# Patient Record
Sex: Female | Born: 1955 | Race: Black or African American | Hispanic: No | Smoking: Never smoker
Health system: Southern US, Community
[De-identification: ages and names within clinical notes are randomized; demographics above are authoritative.]

## PROBLEM LIST (undated history)

## (undated) DIAGNOSIS — E785 Hyperlipidemia, unspecified: Secondary | ICD-10-CM

## (undated) DIAGNOSIS — G459 Transient cerebral ischemic attack, unspecified: Secondary | ICD-10-CM

## (undated) DIAGNOSIS — J45909 Unspecified asthma, uncomplicated: Secondary | ICD-10-CM

## (undated) DIAGNOSIS — I4891 Unspecified atrial fibrillation: Secondary | ICD-10-CM

## (undated) DIAGNOSIS — G2582 Stiff-man syndrome: Secondary | ICD-10-CM

## (undated) DIAGNOSIS — J449 Chronic obstructive pulmonary disease, unspecified: Secondary | ICD-10-CM

## (undated) DIAGNOSIS — E119 Type 2 diabetes mellitus without complications: Secondary | ICD-10-CM

## (undated) DIAGNOSIS — I10 Essential (primary) hypertension: Secondary | ICD-10-CM

## (undated) DIAGNOSIS — R569 Unspecified convulsions: Secondary | ICD-10-CM

## (undated) DIAGNOSIS — D649 Anemia, unspecified: Secondary | ICD-10-CM

## (undated) DIAGNOSIS — F32A Depression, unspecified: Secondary | ICD-10-CM

## (undated) HISTORY — DX: Stiff-man syndrome: G25.82

## (undated) HISTORY — PX: OTHER SURGICAL HISTORY: SHX169

## (undated) HISTORY — PX: HYSTERECTOMY: SHX81

## (undated) NOTE — Telephone Encounter (Signed)
Formatting of this note is different from the original.  Patient requesting Medication refill.      Medication Name Tramadol     Last Ordered 04/29/2020     Medication Refill Policy Required Information    Last visit with provider was 04/29/2020    Next appt in this department: Visit date not found    VERIFIED:      Allergies   Allergen Reactions   ? Montelukast rash/itching and swelling   ? Nitroglycerin hives and rash/itching   ? Magnesium Sulfate rash/itching   ? Aspirin gi distress       ? Bumex [Bumetanide] muscle/joint pain   ? Hytrin [Terazosin] hives   ? Iodinated Contrast Media swelling     Throat swelling and lip itching   ? Penicillins hives     Has tolerated ceftriaxone and cephalexin   ? Tetracyclines hives   ? Latex, Natural Rubber rash/itching     Current Outpatient Medications on File Prior to Visit   Medication Sig Dispense Refill   ? albuterol sulfate (PROAIR DIGIHALER) 90 mcg/actuation INH aebs Take 180 mcg inhaled by mouth Every 4 Hours As Needed.     ? amLODIPine (NORVASC) 2.5 mg PO TABS Take 2.5 mg by Mouth Once a Day.     ? atorvastatin (LIPITOR) 40 mg PO TABS TAKE 1 TABLET BY MOUTH EVERYDAY AT BEDTIME 90 Tab 1   ? budesonide-formoteroL (SYMBICORT) 80-4.5 mcg/actuation INH HFAA Take 2 Puffs inhaled by mouth Twice Daily. 1 Inhaler 2   ? dicyclomine (BENTYL) 20 mg PO TABS Take 20 mg by Mouth Twice Daily.     ? ELIQUIS 5 mg PO TABS Take 5 mg by Mouth Twice Daily.     ? EPINEPHrine (EPIPEN) 0.3 mg/0.3 mL Inj AtIn Inject 0.3 mL into the muscle Take As Needed for Other (ANAPHYLAXIS). Q 10-30 minutes prn for ANAPHYLAXIS ONLY 2 Each 1   ? furosemide (LASIX) 40 mg PO TABS Take 40 mg by Mouth Twice Daily.     ? hydrOXYzine pamoate (VISTARIL) 25 mg PO CAPS Take 1 Cap by Mouth Twice Daily. 8am, 8pm     ? lamoTRIgine (LAMICTAL) 100 mg PO TABS Take 100 mg by Mouth Every Morning.     ? LORazepam (ATIVAN) 0.5 mg PO TABS Take 0.5 mg by Mouth Daily as needed.     ? lurasidone (LATUDA) 120 mg PO TABS Take 120 mg by  Mouth every evening.     ? methylPREDNISolone (MEDROL, PAK,) 4 mg PO DsPk Take as directed on the package - dispense one pack 21 Tab 0   ? metoprolol XL (TOPROL XL) 25 mg PO TB24 TAKE 1 TABLET BY MOUTH EVERY DAY 90 Tab 1   ? pantoprazole (PROTONIX) 40 mg PO TBEC TAKE 1 TABLET BY MOUTH TWICE A DAY 180 Tab 1   ? potassium chloride ER (K-DUR;KLOR-CON) 10 mEq PO TbTQ tablet Take 10 mEq by Mouth 3 Times Daily.     ? promethazine (PHENERGAN) 25 mg PO TABS Take 1 Tab by Mouth Every 6 Hours As Needed for Nausea. 20 Tab 0   ? QUEtiapine 50 mg PO TABS Take 50 mg by Mouth Every Night at Bedtime.  2   ? rOPINIRole (REQUIP) 0.5 mg PO TABS TAKE 2 TABLETS BY MOUTH DAILY AT BEDTIME 180 Tab 1   ? SUMAtriptan (IMITREX) 100 mg PO TABS Take 1 Tab by Mouth Take As Needed. Take 1 tab at onset of headache.  May repeat in 2 hours  if no relief.  No more than 2 doses in a 24 hour period. 9 Tab 2   ? terazosin (HYTRIN) 2 mg PO CAPS TAKE 1 CAPSULE BY MOUTH EVERYDAY AT BEDTIME 90 Cap 1   ? topiramate (TOPAMAX) 25 mg PO TABS Take 1 Tab by Mouth Twice Daily. 60 Tab 0   ? traMADoL (ULTRAM) 50 mg PO TABS Take 1 Tab by Mouth 2 Times Daily As Needed. for pain 60 Tab 0     Current Facility-Administered Medications on File Prior to Visit   Medication Dose Route Frequency Provider Last Rate Last Admin   ? [COMPLETED] diphenhydrAMINE (BenadryL) capsule 25 mg  25 mg Oral Once [place], Liane Comber, DO   25 mg at 05/16/20 1831   ? [COMPLETED] diphenhydrAMINE (BenadryL) injection 25 mg  25 mg IV Push Once [place], Liane Comber, DO   25 mg at 05/16/20 2000   ? [COMPLETED] ketorolac ([place]L) injection 15 mg  15 mg Intravenous Once [place], Liane Comber, DO   15 mg at 05/16/20 2000   ? [COMPLETED] prochlorperazine Edisylate (Compazine) injection 10 mg  10 mg IV Push Once [place], Liane Comber, DO   10 mg at 05/16/20 1831   ? [COMPLETED] sodium chloride (normal saline) 0.9% infusion  1,000 mL Intravenous IV [place] Scarlette Ar, DO   Stopped at 05/16/20 2001     Electronically signed by  Dorthula Rue at 05/27/2020  7:09 AM EDT

## (undated) NOTE — Telephone Encounter (Signed)
Formatting of this note is different from the original.    Patient requesting Medication refill.      Medication Name pantoprazole 40 mg    Last Ordered 10/24/21     Medication Refill Policy Required Information    Last visit with provider was 12/24/2021    Next appt in this department: Visit date not found    VERIFIED:  Erline Levine    Allergies   Allergen Reactions    Montelukast rash/itching and swelling    Nitroglycerin hives and rash/itching    Magnesium Sulfate rash/itching    Atorvastatin unknown     Rhabdomyolysis     Bumex [Bumetanide] muscle/joint pain    Tetracaine Hcl unknown    Iodinated Contrast Media swelling     Throat swelling and lip itching    Penicillins hives     Has tolerated ceftriaxone and cephalexin    Tetracyclines hives    Latex, Natural Rubber rash/itching and hives     Current Outpatient Medications on File Prior to Visit   Medication Sig Dispense Refill    apixaban (ELIQUIS) 5 mg PO TABS Take 5 mg by Mouth Twice Daily.      EPINEPHrine (EPIPEN) 0.3 mg/0.3 mL Inj AtIn Inject 0.3 mL into the muscle Take As Needed for Other (ANAPHYLAXIS). Q 10-30 minutes prn for ANAPHYLAXIS ONLY 2 Each 1    folic acid (FOLVITE) [id number] mcg PO TABS Take 1 Tab by Mouth Once a Day. 30 Tab 11    hydrOXYzine pamoate (VISTARIL) 25 mg PO CAPS Take 1-2 Caps by Mouth Every 6 Hours As Needed.      lamoTRIgine (LAMICTAL) 100 mg PO TABS Take 100 mg by Mouth Once a Day. Indications: epilepsy      LORazepam (ATIVAN) 0.5 mg PO TABS Take 0.5 mg by Mouth Every Night at Bedtime.      magnesium oxide (MAG-OX) 400 mg (241.3 mg magnesium) PO TABS TAKE 1 TABLET EVERY DAY FOR 15 DAYS      MetoCLOPramide (REGLAN) 10 mg PO ODT. Take 1 Tab by Mouth 3 (three) times daily before meals. 60 Tab 0    ondansetron (ZOFRAN) 4 mg PO TABS Take 1 Tab by Mouth Every 8 Hours As Needed. 30 Tab 0    pantoprazole (PROTONIX) 40 mg PO TBEC Take 40 mg by Mouth Twice Daily.      QUEtiapine (SEROQUEL) 100 mg PO TABS Take 100 mg by Mouth Every Night at Bedtime.       Rizatriptan 10 mg PO TABS TAKE 1 TAB BY MOUTH ONCE AS NEEDED FOR UP TO 1 DOSE. TAKE ONCE AT ONSET OF MIGRAINE. MAY REPEAT IN 2 HOURS IF NO IMPROVEMENT. NO MORE THAN 2 PILLS IN A 24 HOUR PERIOD. 9 Tab 3    rOPINIRole (REQUIP) 0.5 mg PO TABS TAKE 2 TABLETS BY MOUTH AT BEDTIME 180 Tab 1    SYMBICORT 80-4.5 mcg/actuation INH HFAA Take 2 Puffs inhaled by mouth Twice Daily.      terazosin (HYTRIN) 2 mg PO CAPS Take 2 mg by Mouth Every Night at Bedtime. Indications: high blood pressure      traMADoL (ULTRAM) 50 mg PO TABS Take 1 Tab by Mouth 2 Times Daily As Needed. for pain 60 Tab 0     No current facility-administered medications on file prior to visit.       Electronically signed by Mackie Pai Y at 01/25/2022 10:48 AM EDT

## (undated) NOTE — Telephone Encounter (Signed)
Formatting of this note is different from the original.  Patient requesting Medication refill.      Medication Name terazosin    Last Ordered 10-04-2021     Medication Refill Policy Required Information    Last visit with provider was 12/24/2021    Next appt in this department: Visit date not found    VERIFIED:      Allergies   Allergen Reactions    Montelukast rash/itching and swelling    Nitroglycerin hives and rash/itching    Magnesium Sulfate rash/itching    Atorvastatin unknown     Rhabdomyolysis     Bumex [Bumetanide] muscle/joint pain    Tetracaine Hcl unknown    Iodinated Contrast Media swelling     Throat swelling and lip itching    Penicillins hives     Has tolerated ceftriaxone and cephalexin    Tetracyclines hives    Latex, Natural Rubber rash/itching and hives     Current Outpatient Medications on File Prior to Visit   Medication Sig Dispense Refill    [EXPIRED] acetaminophen (TYLENOL) 500 mg PO TABS Take 2 Tabs by Mouth 3 Times Daily for 10 days. 60 Tab 0    apixaban (ELIQUIS) 5 mg PO TABS Take 5 mg by Mouth Twice Daily.      aspirin 81 mg PO CHEW Take 1 Tab by Mouth Once a Day for 30 days. 30 Tab 0    cephALEXin (KEFLEX) 500 mg PO CAPS Take 1 Cap by Mouth Every 6 Hours for 7 days. 28 Cap 0    EPINEPHrine (EPIPEN) 0.3 mg/0.3 mL Inj AtIn Inject 0.3 mL into the muscle Take As Needed for Other (ANAPHYLAXIS). Q 10-30 minutes prn for ANAPHYLAXIS ONLY 2 Each 1    [EXPIRED] erythromycin base (E-MYCIN) 500 mg PO TABS Take 1 Tab by Mouth 4 Times Daily for 15 days. 60 Tab 0    folic acid (FOLVITE) A999333 mcg PO TABS Take 1 Tab by Mouth Once a Day. 30 Tab 11    hydrOXYzine pamoate (VISTARIL) 25 mg PO CAPS Take 1-2 Caps by Mouth Every 6 Hours As Needed.      lamoTRIgine (LAMICTAL) 100 mg PO TABS Take 100 mg by Mouth Once a Day. Indications: epilepsy      [DISCONTINUED] LATUDA 120 mg PO TABS Take 120 mg by Mouth every evening. Indications: bipolar depression      [DISCONTINUED] levoFLOXacin (LEVAQUIN) 750 mg PO TABS  Take 1 Tab by Mouth Once a Day for 2 days. 2 Tab 0    LORazepam (ATIVAN) 0.5 mg PO TABS Take 0.5 mg by Mouth Every Night at Bedtime.      magnesium oxide (MAG-OX) 400 mg (241.3 mg magnesium) PO TABS TAKE 1 TABLET EVERY DAY FOR 15 DAYS      MetoCLOPramide (REGLAN) 10 mg PO ODT. Take 1 Tab by Mouth 3 (three) times daily before meals. 60 Tab 0    [DISCONTINUED] MetoCLOPramide (REGLAN) 10 mg PO ODT. Take 1 Tab by Mouth 4 Times Daily for 14 days. 56 Tab 0    [EXPIRED] metoprolol XL (TOPROL XL) 25 mg PO TB24 Take 1 Tab by Mouth Once a Day for 30 days. Indications: high blood pressure 30 Tab 0    ondansetron (ZOFRAN) 4 mg PO TABS Take 1 Tab by Mouth Every 8 Hours As Needed. 30 Tab 0    [DISCONTINUED] ondansetron (ZOFRAN) 4 mg PO TABS Take 4 mg by Mouth.      pantoprazole (PROTONIX) 40 mg PO TBEC  Take 40 mg by Mouth Twice Daily.      [EXPIRED] potassium chloride ER (K-DUR;KLOR-CON) 20 mEq PO tablet Take 1 Tab by Mouth Twice Daily for 7 days. 14 Tab 0    [DISCONTINUED] potassium chloride ER (K-DUR;KLOR-CON) 10 mEq PO TbTQ tablet Take 1 Tab by Mouth Once a Day for 7 days. 7 Tab 0    QUEtiapine (SEROQUEL) 100 mg PO TABS Take 100 mg by Mouth Every Night at Bedtime.      Rizatriptan 10 mg PO TABS TAKE 1 TAB BY MOUTH ONCE AS NEEDED FOR UP TO 1 DOSE. TAKE ONCE AT ONSET OF MIGRAINE. MAY REPEAT IN 2 HOURS IF NO IMPROVEMENT. NO MORE THAN 2 PILLS IN A 24 HOUR PERIOD. 9 Tab 3    rOPINIRole (REQUIP) 0.5 mg PO TABS TAKE 2 TABLETS BY MOUTH AT BEDTIME 180 Tab 1    SYMBICORT 80-4.5 mcg/actuation INH HFAA Take 2 Puffs inhaled by mouth Twice Daily.      terazosin (HYTRIN) 2 mg PO CAPS Take 2 mg by Mouth Every Night at Bedtime. Indications: high blood pressure      traMADoL (ULTRAM) 50 mg PO TABS Take 1 Tab by Mouth 2 Times Daily As Needed. for pain 60 Tab 0    [DISCONTINUED] traMADoL (ULTRAM) 50 mg PO TABS TAKE 1 TAB BY MOUTH 2 TIMES DAILY AS NEEDED. FOR PAIN 60 Tab 0     Current Facility-Administered Medications on File Prior to Visit    Medication Dose Route Frequency Provider Last Rate Last Admin    [COMPLETED] acetaminophen (TylenoL) tablet 1,000 mg  1,000 mg Oral Once Lionel December, MD   1,000 mg at 12/23/21 1415    [DISCONTINUED] acetaminophen (TylenoL) tablet 1,000 mg  1,000 mg Oral TID Warner Mccreedy R, NP   1,000 mg at 12/13/21 [id number]    [DISCONTINUED] acetaminophen (TylenoL) tablet 650 mg  650 mg Oral Once Alver Fisher, MD        [DISCONTINUED] acetaminophen (TylenoL) tablet 650 mg  650 mg Oral Q4H PRN Sharene Butters, MD        [DISCONTINUED] apixaban (Eliquis) tablet 5 mg  5 mg Oral BID Sharene Butters, MD   5 mg at 12/13/21 [id number]    [DISCONTINUED] aspirin chew tab 81 mg  81 mg Oral Daily Sharene Butters, MD   81 mg at 12/13/21 [id number]    [DISCONTINUED] atropine injection 1 mg  1 mg IV Push PRN Sharene Butters, MD        [DISCONTINUED] cefTRIAXone (Rocephin) 1 g in SWFI 10 mL syringe  1 g Intravenous Once Lionel December, MD        [COMPLETED] cephALEXin (Keflex) capsule 500 mg  500 mg Oral Once Lionel December, MD   500 mg at 12/23/21 1711    [DISCONTINUED] erythromycin tablet 500 mg  500 mg Oral 4X/Day Sharene Butters, MD   500 mg at 12/13/21 0846    [DISCONTINUED] fluticasone furoate-vilanteroL (Breo) 100-25 mcg/dose inhaler 1 Puff  1 Puff Inhalation Daily Resp Sharene Butters, MD        [DISCONTINUED] folic acid (Folvite) tablet 400 mcg  400 mcg Oral Daily Sharene Butters, MD   400 mcg at 12/13/21 0847    [DISCONTINUED] insulin LISPRO (HumaLOG vial) injection 1-6 Units  1-6 Units Subcutaneous QAC & QHS Sharene Butters, MD        [EXPIRED] Lactated Ringers (LR) infusion  100 mL/hr Intravenous Continuous Sharene Butters, MD 100 mL/hr at 12/11/21 2037 100 mL/hr at 12/11/21  2037    [DISCONTINUED] lamoTRIgine (LaMICtal) tablet 100 mg  100 mg Oral Daily Sharene Butters, MD   100 mg at 12/13/21 0847    [COMPLETED] LORazepam (Ativan) injection 1 mg  1 mg IV Push On Call Clyde Lundborg, NP   1 mg at 12/13/21 [id number]     [DISCONTINUED] LORazepam (Ativan) tablet 0.5 mg  0.5 mg Oral QHS Sharene Butters, MD   0.5 mg at 12/12/21 2017    [DISCONTINUED] lurasidone (Latuda) tablet 120 mg  120 mg Oral QPM Sharene Butters, MD        [DISCONTINUED] metoCLOPramide (Reglan) tablet 5 mg  5 mg Oral Q6H PRN Resp Sharene Butters, MD   5 mg at 12/12/21 [id number]    [DISCONTINUED] metoprolol XL (Toprol XL) tablet 25 mg  25 mg Oral Daily Sharene Butters, MD   25 mg at 12/13/21 [id number]    [DISCONTINUED] morphine injection 1 mg  1 mg Intravenous Q4H PRN Mutongwizo, Farai, DO   1 mg at 12/12/21 0500    [COMPLETED] morphine injection 2 mg  2 mg IV Push Once Percell Miller S, NP   2 mg at 12/13/21 [id number]    [DISCONTINUED] NSFlush injection 10 mL  10 mL Intravenous PRN Sharene Butters, MD        [DISCONTINUED] omeprazole (PriLOSEC) capsule 40 mg  40 mg Oral Daily Sharene Butters, MD   40 mg at 12/13/21 0846    [COMPLETED] ondansetron (PF) (Zofran) injection 4 mg  4 mg IV Push Once Lionel December, MD   4 mg at 12/23/21 1324    [DISCONTINUED] potassium chloride ER (K-Dur;Klor-Con) tablet 20 mEq  20 mEq Oral BID WC Warner Mccreedy R, NP   20 mEq at 12/13/21 [id number]    [DISCONTINUED] potassium chloride ER (K-Dur;Klor-Con) tablet 10 mEq  10 mEq Oral Daily Sharene Butters, MD   10 mEq at 12/12/21 [id number]    [DISCONTINUED] QUEtiapine (SEROquel) tablet 100 mg  100 mg Oral QHS Sharene Butters, MD   100 mg at 12/12/21 2017    [DISCONTINUED] rOPINIRole (Requip) tablet 1 mg  1 mg Oral QHS Sharene Butters, MD   1 mg at 12/12/21 2018    [COMPLETED] sodium chloride (normal saline) 0.9% infusion  1,000 mL Intravenous IV Bolus Lionel December, MD   End Infusion at 12/23/21 1717    [DISCONTINUED] terazosin (Hytrin) capsule 2 mg  2 mg Oral QHS Sharene Butters, MD   2 mg at 12/12/21 2020    [DISCONTINUED] traMADoL (Ultram) tablet 50 mg  50 mg Oral BID PRN Sharene Butters, MD   50 mg at 12/12/21 2126     Electronically signed by Maryelizabeth Rowan at 12/27/2021  8:07 AM EST

## (undated) NOTE — Progress Notes (Signed)
Formatting of this note is different from the original.  [name]   ED/Hospital Discharge   Transition of Care Note / Initial Assessment    Basic Chronic Care Management Coding Initiation    [name]  03/10/1956    The patient has agreed and consented verbally to Chronic Care Management (CCM) services for management of two or more chronic conditions: List all applicable chronic conditions: Neurogenic bladder, A-fib    Patient is aware that services can be discontinued at any time, and that there is a monthly copayment required for the service.    Chronic Care Management Patient Tracker    Date/Time of Contact for Enrollment 05/03/2021  3:27 PM     Method of Contact Phone     Patient Response interested     Enrollment Status currently enrolled     Enrolling Provider Donnal Moat, MD     Reason for Visit:  Chronic Care Management    Type of Nursing Service Rendered:  Telephonic Education    Time Spent on Activity:  0-15 minutes    [name] is a 75 y.o. female patient.    PCP: Donnal Moat, MD    ED/Hospital Transition of Care Call:    HIPPA information verified:  DOB and address  Previous hospital admission in the last 30 days?: yes  Discharge from:  Hospital  Discharge Dx:  Syncope likely vasovagal in nature, probable orthostasis clinical dehydration  Admit date:  01/12/23  D/C date:  01/14/23  Assessment completed with:  Patient  Patient currently relies on the following individual(s) to assist with care:  Self  COPD: yes  Medications reviewed: Yes  Discharge instructions reviewed: yes  TOC follow-up appointment scheduled (MD name/date):  To be scheduled (declined scheduling)  Patient has transportation to the appointment: yes    Assessment:    Constitutional:  Positive for fatigue.   Skin: Negative.    HENT: Negative.     Eyes: Negative.    Cardiovascular: Negative.    Respiratory: Negative.     Gastrointestinal: Negative.    Endocrine: Negative.   Genitourinary: Negative.     Musculoskeletal: Negative.    Hematologic: Negative.    Allergy/Immuno: Negative.  Neurological: Negative.   Psychiatric/Behavioral:  Positive for depressed mood.      Outreach call to the patient who reports doing okay. The patient reports she has increased her fluid intake and also has vitamin water for hydration. When asked to schedule the patient a hospital follow-up appointment with Dr. Jimmye Norman, the patient stated that she does not need an appointment at this time. When asked about her suprapubic catheter, the patient stated that she did not want to talk about the catheter during this call. The patient agreed to a call in 1 week when she is doing better.    ADL's/Activity/Support/Social  Lives with daughter, son-in-law, and grandchildren  Independent with ADLs/IADLs    Education/Interventions provided:  * Other (see comments) .  * Fluid hydration    Follow-up and Plan:  Call and monitor weekly and PRN for 30 days.         Active Goals         Patient Goals    1. Eat Healthy      Follow Up Date 01/23/2023     - set a realistic goal     Why is this important?    When you are ready to manage your nutrition or weight, having a plan and setting  goals will help.   Taking small steps to change how you eat and exercise is a good place to start.     Notes:      2. Monitor and Manage My Blood Sugar      Follow Up Date 01/23/2023    - check blood sugar (glucose) at prescribed times  - check blood sugar before and after exercise  - check blood sugar if I feel it is too high or too low  - enter blood sugar (glucose) readings and medication or insulin into daily log  - take the blood sugar (glucose) log to all doctor visits     Why is this important?    Checking your blood sugar at home helps to keep it from getting very high or very low.   Writing the results in a diary or log helps the doctor know how to care for you.   Your blood sugar log should have the time, date and the results.   Also, write down the amount of insulin  or other medicine that you take.   Other information, like what you ate, exercise done and how you were feeling, will also be helpful.      Notes:      3. Track and Manage My Blood Pressure      Follow Up Date 01/23/2023    - check blood pressure daily  - choose a place to take my blood pressure (home, clinic or office, retail store)  - write blood pressure results in a log or diary     Why is this important?    You won't feel high blood pressure, but it can still hurt your blood vessels.   High blood pressure can cause heart or kidney problems. It can also cause a stroke.   Making lifestyle changes like losing a little weight or eating less salt will help.   Checking your blood pressure at home and at different times of the day can help to control blood pressure.   If the doctor prescribes medicine remember to take it the way the doctor ordered.   Call the office if you cannot afford the medicine or if there are questions about it.      Notes:           Home Medication List - Marked as Reviewed on 01/12/23 1620   Medication Sig   apixaban (ELIQUIS) 5 mg PO TABS Take 1 Tab by Mouth Twice Daily.   baclofen (LIORESAL) 10 mg PO TABS Take 1 Tab by Mouth 2 Times Daily As Needed for Mild Pain (Pain Score 1-3).   diazePAM (VALIUM) 5 mg PO TABS TAKE 1 TABLET IN THE EVENING AS NEEDED FOR SPASMS   diclofenac sodium 1 % Top GEL USE 4 GRAM TO AFFECTED AREA 4 TIMES DAILY.   EPINEPHrine (EPIPEN) 0.3 mg/0.3 mL Inj AtIn Inject 0.3 mL into the muscle Take As Needed for Other (ANAPHYLAXIS). Q 10-30 minutes prn for ANAPHYLAXIS ONLY   gabapentin (NEURONTIN) 100 mg PO CAPS Take 1 Cap by Mouth 3 Times Daily.   hydrOXYzine pamoate (VISTARIL) 50 mg PO CAPS Take 1 Cap by Mouth 2 Times Daily As Needed.   hyoscyamine (LEVSIN SL) 0.125 mg SL SUBL Dissolve 1 Tab under tongue Every 4 Hours As Needed (bladder spasms).   lamoTRIgine (LAMICTAL) 100 mg PO TABS Take 1 Tab by Mouth Once a Day. Indications: epilepsy   lurasidone 120 mg PO TABS Take 1 Tab  by Mouth Once a  Day.   metoCLOPramide (REGLAN) 10 mg PO TABS Take 1 Tab by Mouth Take As Needed.   metoprolol XL (TOPROL XL) 25 mg PO TB24 Take 1 Tab by Mouth Once a Day.   pantoprazole (PROTONIX) 40 mg PO TBEC take 1 tablet by mouth twice a day   potassium chloride (KLOR-CON) 10 mEq PO TbSR Take 1 Tab by Mouth 3 Times Daily.   promethazine (PHENERGAN) 25 mg PO TABS Take 1 Tab by Mouth Every 6 Hours As Needed.   QUEtiapine (SEROQUEL) 100 mg PO TABS Take 1 Tab by Mouth Every Night at Bedtime.   rOPINIRole (REQUIP) 0.5 mg PO TABS Take 2 Tabs by Mouth Every Night at Bedtime.   thiamine 100 mg PO TABS Take 1 Tab by Mouth Once a Day.   torsemide (DEMADEX) 20 mg PO TABS Take 1 Tab by Mouth Twice Daily.   traMADoL (ULTRAM) 50 mg PO TABS Take 1 Tab by Mouth 2 Times Daily As Needed. 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[facility])     CVA post endodartorectomy    Chest pain     has history multiple admissions for this, stress test and cardiac cath negative    Chronic anemia     Hgb ~10    COPD (chronic obstructive pulmonary disease) (HCC)     Cough     COVID     COVID-19 vaccine series completed     CVA (cerebral vascular accident) ([place]) 09/01/2021    Deficiency anemia 11/2020    rx with [facility]    Epilepsy ([place])     per pt last Sz was approx 9-10 months ago    Esophageal reflux     Headache     Hemiplegia and hemiparesis following cerebral infarction affecting left non-dominant side ([place]) 01/22/2020    Hemorrhoids     History of blood transfusion     following one of her pregnancy    History of cardiac catheterization no obstructive cad in 2017     Hyperlipidemia     Hyperlipidemia     Hypertension     Lower leg edema     Migraines     Paroxysmal atrial fibrillation (HCC)     on  anticoagulation    Seizure-like activity (HCC)     EEG negative    Stiff person syndrome     Syncope     recurrent, has loop recorder    Thoracic aortic aneurysm (HCC)     TIA (transient ischemic attack)     multiple since age 40's per patient. Multiple MRI brain in the past negative for evidence of stroke. Suspected complicated migraine.    Type 2 diabetes mellitus ([place])     not on medication    Unable to coordinate sucking, swallowing, and breathing 09/28/2019     Patient Care Team:  Donnal Moat, MD as PCP - General (Family Practice)  Dery, Althia Forts, Pharmacist (Ambulatory Pharmacist)  Kathlene Cote, Pharmacist (Ambulatory Pharmacist)  Carolann Littler, MD as Consulting Provider (Neurology)  Jackqulyn Livings, MD as Cardiologist (Cardiology)  Jenita Seashore, MD as Hematologist (Hematology and Oncology)  [place],  Museum/gallery conservator, RN as RN  Dwyane Dee, Richardo Hanks, MD as Consulting Provider (Hospitalist)  Marciano Sequin, MD as Gastroenterologist (Gastroenterology)  Dr. Ledon Snare as Psychiatrist (Psychiatry)  Gasper Lloyd, NP as Urologist (Urology)  Renita Papa, RN as Integrated Care Manager ([place])    Chronic Care Mgmt Time Spent with Patient     Time spent with patient (minutes): [place], BSN, Therapist, sports, [place] ([address]) and [place]  [phone]      Electronically signed by Renita Papa, RN at 01/16/2023  1:03 PM EDT

---

## 2001-04-28 ENCOUNTER — Emergency Department (HOSPITAL_COMMUNITY): Admission: EM | Admit: 2001-04-28 | Discharge: 2001-04-28 | Payer: Self-pay | Admitting: Emergency Medicine

## 2001-04-28 ENCOUNTER — Encounter: Payer: Self-pay | Admitting: Emergency Medicine

## 2001-07-03 ENCOUNTER — Encounter: Payer: Self-pay | Admitting: Orthopaedic Surgery

## 2001-07-03 ENCOUNTER — Ambulatory Visit (HOSPITAL_COMMUNITY): Admission: RE | Admit: 2001-07-03 | Discharge: 2001-07-03 | Payer: Self-pay | Admitting: Orthopaedic Surgery

## 2001-07-17 ENCOUNTER — Emergency Department (HOSPITAL_COMMUNITY): Admission: EM | Admit: 2001-07-17 | Discharge: 2001-07-18 | Payer: Self-pay | Admitting: Emergency Medicine

## 2001-07-18 ENCOUNTER — Encounter: Payer: Self-pay | Admitting: Emergency Medicine

## 2001-08-23 ENCOUNTER — Emergency Department (HOSPITAL_COMMUNITY): Admission: EM | Admit: 2001-08-23 | Discharge: 2001-08-23 | Payer: Self-pay | Admitting: Emergency Medicine

## 2001-08-23 ENCOUNTER — Encounter: Payer: Self-pay | Admitting: Emergency Medicine

## 2001-09-17 ENCOUNTER — Encounter: Payer: Self-pay | Admitting: Emergency Medicine

## 2001-09-18 ENCOUNTER — Observation Stay (HOSPITAL_COMMUNITY): Admission: EM | Admit: 2001-09-18 | Discharge: 2001-09-18 | Payer: Self-pay | Admitting: Emergency Medicine

## 2001-10-15 ENCOUNTER — Inpatient Hospital Stay (HOSPITAL_COMMUNITY): Admission: EM | Admit: 2001-10-15 | Discharge: 2001-10-16 | Payer: Self-pay | Admitting: Emergency Medicine

## 2001-10-15 ENCOUNTER — Encounter: Payer: Self-pay | Admitting: Emergency Medicine

## 2001-11-14 ENCOUNTER — Ambulatory Visit (HOSPITAL_COMMUNITY): Admission: RE | Admit: 2001-11-14 | Discharge: 2001-11-14 | Payer: Self-pay | Admitting: General Surgery

## 2001-11-14 ENCOUNTER — Encounter: Payer: Self-pay | Admitting: General Surgery

## 2001-12-07 ENCOUNTER — Emergency Department (HOSPITAL_COMMUNITY): Admission: EM | Admit: 2001-12-07 | Discharge: 2001-12-07 | Payer: Self-pay | Admitting: Emergency Medicine

## 2001-12-26 ENCOUNTER — Encounter: Payer: Self-pay | Admitting: Emergency Medicine

## 2001-12-26 ENCOUNTER — Emergency Department (HOSPITAL_COMMUNITY): Admission: EM | Admit: 2001-12-26 | Discharge: 2001-12-26 | Payer: Self-pay | Admitting: Emergency Medicine

## 2001-12-30 ENCOUNTER — Emergency Department (HOSPITAL_COMMUNITY): Admission: EM | Admit: 2001-12-30 | Discharge: 2001-12-30 | Payer: Self-pay | Admitting: Emergency Medicine

## 2001-12-30 ENCOUNTER — Encounter: Payer: Self-pay | Admitting: Emergency Medicine

## 2001-12-31 ENCOUNTER — Encounter: Payer: Self-pay | Admitting: *Deleted

## 2001-12-31 ENCOUNTER — Emergency Department (HOSPITAL_COMMUNITY): Admission: EM | Admit: 2001-12-31 | Discharge: 2001-12-31 | Payer: Self-pay | Admitting: Emergency Medicine

## 2002-01-01 ENCOUNTER — Emergency Department (HOSPITAL_COMMUNITY): Admission: EM | Admit: 2002-01-01 | Discharge: 2002-01-01 | Payer: Self-pay | Admitting: Emergency Medicine

## 2002-01-05 ENCOUNTER — Emergency Department (HOSPITAL_COMMUNITY): Admission: EM | Admit: 2002-01-05 | Discharge: 2002-01-05 | Payer: Self-pay

## 2002-01-07 ENCOUNTER — Encounter: Payer: Self-pay | Admitting: *Deleted

## 2002-01-08 ENCOUNTER — Inpatient Hospital Stay (HOSPITAL_COMMUNITY): Admission: EM | Admit: 2002-01-08 | Discharge: 2002-01-09 | Payer: Self-pay | Admitting: *Deleted

## 2002-01-13 ENCOUNTER — Emergency Department (HOSPITAL_COMMUNITY): Admission: EM | Admit: 2002-01-13 | Discharge: 2002-01-13 | Payer: Self-pay | Admitting: Internal Medicine

## 2002-01-13 ENCOUNTER — Encounter: Payer: Self-pay | Admitting: Internal Medicine

## 2004-10-08 ENCOUNTER — Ambulatory Visit: Payer: Self-pay | Admitting: Cardiology

## 2004-10-09 ENCOUNTER — Inpatient Hospital Stay (HOSPITAL_COMMUNITY): Admission: AD | Admit: 2004-10-09 | Discharge: 2004-10-12 | Payer: Self-pay | Admitting: Cardiology

## 2004-10-09 ENCOUNTER — Ambulatory Visit: Payer: Self-pay | Admitting: Cardiology

## 2004-11-26 ENCOUNTER — Inpatient Hospital Stay (HOSPITAL_COMMUNITY): Admission: EM | Admit: 2004-11-26 | Discharge: 2004-11-28 | Payer: Self-pay | Admitting: Emergency Medicine

## 2010-06-16 ENCOUNTER — Emergency Department (HOSPITAL_COMMUNITY): Admission: EM | Admit: 2010-06-16 | Discharge: 2010-06-16 | Payer: Self-pay | Admitting: Emergency Medicine

## 2010-06-16 IMAGING — CR DG CHEST 1V PORT
1 series · 1 of 1 positions shown · non-contrast
Comparison: [DATE] and earlier.

CLINICAL DATA: 54-year-old female with chest pain, arrhythmia.

PORTABLE CHEST - 1 VIEW

[view not recorded]
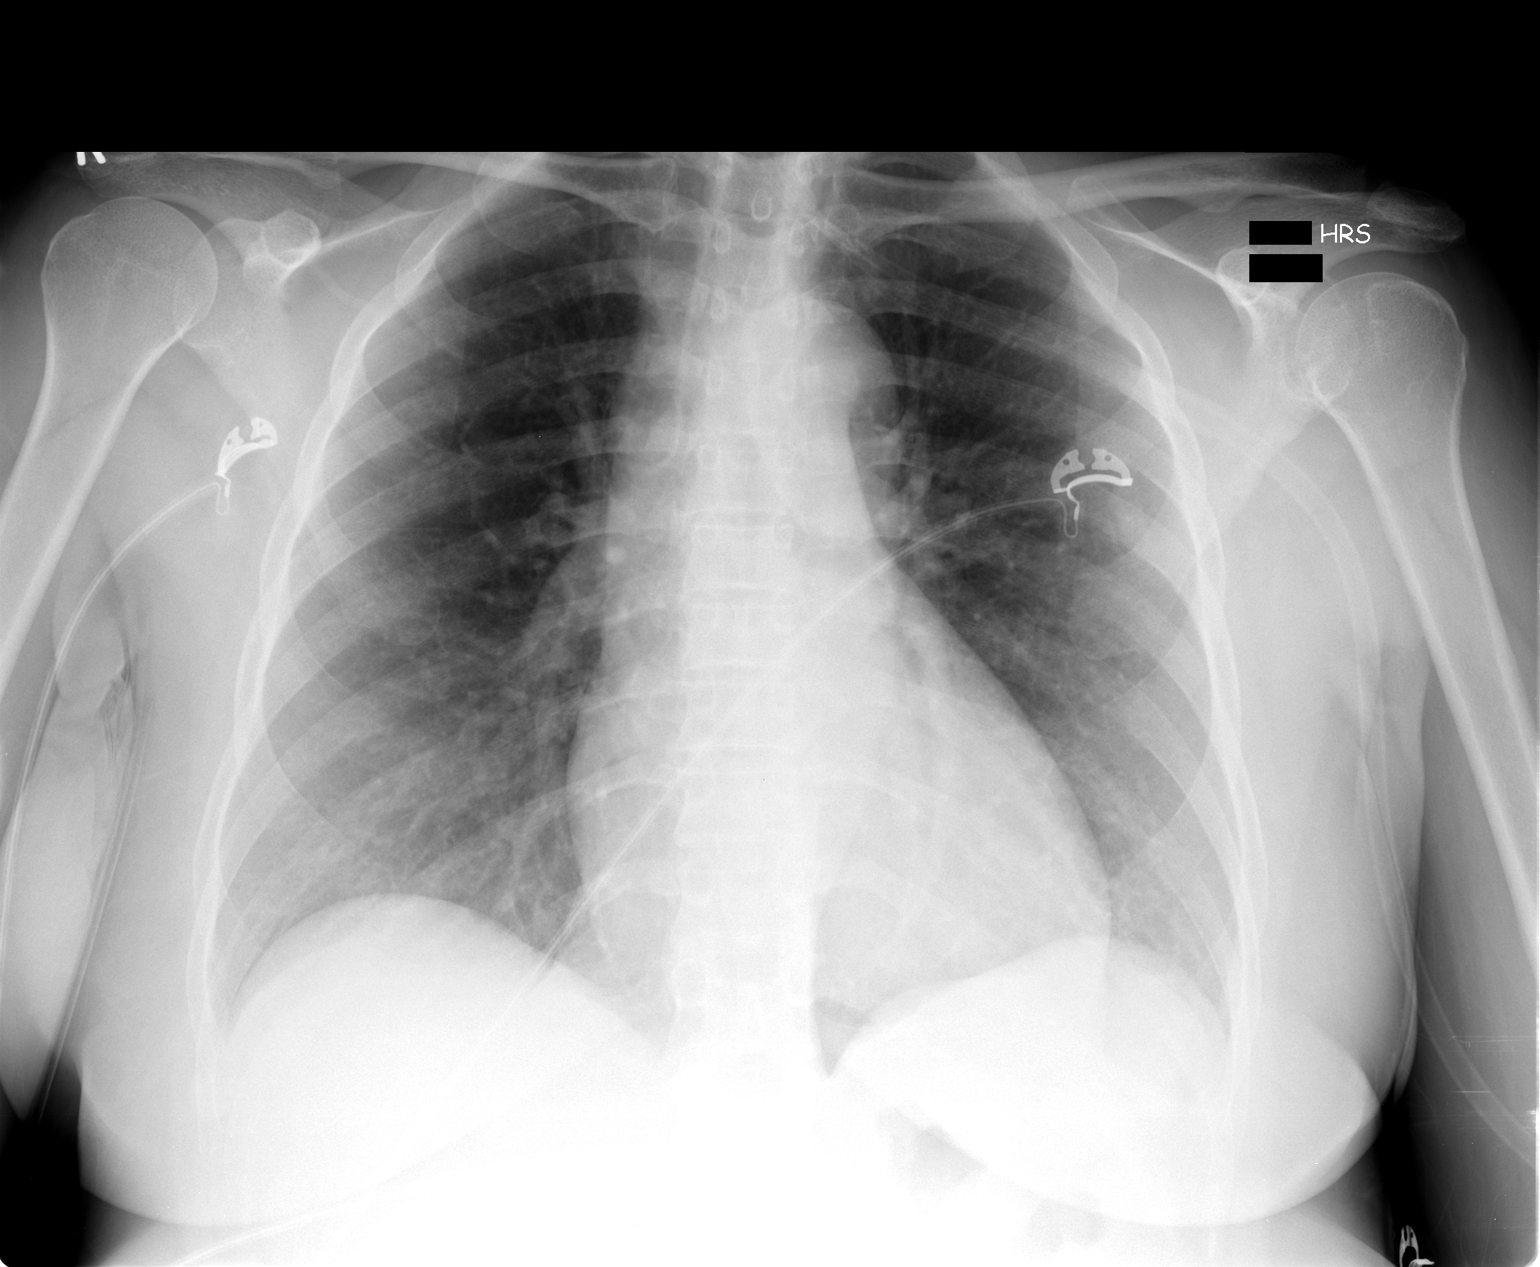

[1 of 1 positions shown; findings below may reference images not displayed]

FINDINGS: Portable upright AP view [BS] hours.  Stable lung
volumes.  Stable mild cardiomegaly. Other mediastinal contours are
within normal limits.  Visualized tracheal air column is within
normal limits.  No pneumothorax, pulmonary edema, pleural effusion
or acute airspace opacity.
IMPRESSION: No acute cardiopulmonary abnormality.

## 2011-01-06 LAB — RAPID URINE DRUG SCREEN, HOSP PERFORMED
Amphetamines: NOT DETECTED
Barbiturates: NOT DETECTED
Benzodiazepines: NOT DETECTED
Tetrahydrocannabinol: NOT DETECTED

## 2011-01-06 LAB — BRAIN NATRIURETIC PEPTIDE: Pro B Natriuretic peptide (BNP): 43.3 pg/mL (ref 0.0–100.0)

## 2011-01-06 LAB — POCT CARDIAC MARKERS
CKMB, poc: 3 ng/mL (ref 1.0–8.0)
Myoglobin, poc: 149 ng/mL (ref 12–200)
Troponin i, poc: <0.05 ng/mL (ref 0.00–0.09)

## 2011-01-06 LAB — ETHANOL: Alcohol, Ethyl (B): 52 mg/dL — ABNORMAL HIGH (ref 0–10)

## 2011-01-06 LAB — COMPREHENSIVE METABOLIC PANEL
AST: 27 U/L (ref 0–37)
Alkaline Phosphatase: 49 U/L (ref 39–117)
BUN: 7 mg/dL (ref 6–23)
CO2: 27 mEq/L (ref 19–32)
Calcium: 8.4 mg/dL (ref 8.4–10.5)
Chloride: 107 mEq/L (ref 96–112)
GFR calc Af Amer: >60 mL/min (ref >60)
Glucose, Bld: 85 mg/dL (ref 70–99)
Total Protein: 6.8 g/dL (ref 6.0–8.3)

## 2011-01-06 LAB — CBC
HCT: 30.2 % — ABNORMAL LOW (ref 36.0–46.0)
MCHC: 33.4 g/dL (ref 30.0–36.0)
MCV: 84.1 fL (ref 78.0–100.0)
RDW: 13.8 % (ref 11.5–15.5)
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
## 2011-03-11 NOTE — Discharge Summary (Signed)
[place]  Patient:    TIMMIE, CALIX Visit Number: [id number] MRN: [id number]          Service Type: MED Location: 2000 2010 01 Attending Physician:  Glennon Hamilton Dictated by:   Tereso Newcomer, P.A. Admit Date:  10/15/2001 Discharge Date: 10/16/2001   CC:         Dr. Adin Hector, Terry  Lewayne Bunting, M.D. [facility], [place], [place]   Discharge Summary  DATE OF BIRTH:  [birth year]  DISCHARGE DIAGNOSES: 1. Noncardiac chest pain -- etiology unclear. 2. Hypertension. 3. History of anemia. 4. Status post hysterectomy. 5. Negative colonoscopy recently. 6. History of cocaine abuse.  PROCEDURES PERFORMED THIS ADMISSION:  Cardiac catheterization by Dr. Daisey Must on October 16, 2001 revealing left main normal, left anterior descending less than 20%, left circumflex normal, right coronary artery less than 20%.  Left ventriculogram revealing normal wall motion, ejection fraction 62%, no mitral regurgitation.  HOSPITAL COURSE:  This 55 year old female presented to the emergency room on October 15, 2001 with complaints of three days of constant chest heaviness with associated shortness of breath, nausea, diaphoresis and radiation to her left arm.  She had recently been seen in the hospital with complaints of chest pain.  She had ruled out for myocardial infarction by enzymes and was set up for a Cardiolite in our office.  This apparently was performed last week and was negative per her report.  On initial exam, her blood pressure was 138/93, pulse 102, respirations 20. Neck without JVD.  Lungs clear to auscultation.  Heart regular rate and rhythm.  Normal S1 and S2.  Extremities without clubbing, cyanosis, or edema. Rectal exam heme-negative.  Chest x-ray showed no acute disease, no infiltrates, no cardiomegaly.  EKG revealed heart rate 78, sinus rhythm, with T wave inversions in II, III, aVF and V3 through 6 and this was new since her tracing  on September 18, 2001.  She was admitted and placed on nitroglycerin, aspirin, Cardizem and morphine sulfate.  No beta blockers were used secondary to her history of cocaine abuse, even though she denied any cocaine use within the last three years. She was noted to be hypokalemia upon admission with a potassium of 2.9; this was repleted.  Her potassium on October 16, 2001 was 3.9.  She went for cardiac catheterization by Dr. Daisey Must; the results are noted above. She tolerated procedure well and had no immediate complications.  Post catheterization, her groin was stable without hematomas or bruise.  It was felt she was stable enough for discharge to home and she could follow up with her primary care physician.  LABORATORY DATA:  Hemoglobin 12.1, hematocrit 35.5, white blood cell count 10,600, platelet count 340,000.  INR 1.0.  Sodium 135, potassium 3.9, chloride 101, CO2 25, glucose 136, BUN 8, creatinine 0.7, total bilirubin 0.8, alkaline phosphatase 42, AST 13, ALT 9, total protein 7, albumin 3.5, calcium 9, magnesium 1.6, amylase 62, lipase 25.  CK-MB and troponin I negative x 3. Brain natriuretic peptide less than 5.  Total cholesterol 178, triglycerides 53, HDL 79, LDL 88.  C-reactive protein 0.3.  CURRENT MEDICATIONS: 1. HCTZ 25 mg q.d. 2. K-Dur 20 mEq q.d.; this was increased this admission. 3. Aspirin 325 mg q.d. 4. Paxil 25 mg q.d. 5. Ferrous sulfate 325 mg b.i.d.  ACTIVITY:  No driving, heavy lifting, exertion, sex or work for three days.  DIET:  Low fat, low sodium.  SPECIAL DISCHARGE INSTRUCTIONS:  She  is to call our office for any groin swelling, bleeding or bruising.  FOLLOWUP:  She is to follow up with her primary care physician, Dr. Sherrie Mustache, in two weeks and she should call her for an appointment. Dictated by:   Tereso Newcomer, P.A. Attending Physician:  Glennon Hamilton DD:  10/16/01 TD:  10/16/01 Job: [id number]

## 2011-03-11 NOTE — Discharge Summary (Signed)
[place]  Patient:    RUTHMARY, OCCHIPINTI Visit Number: [id number] MRN: [id number]          Service Type: OBV Location: 3700 3708 01 Attending Physician:  Nelta Numbers Dictated by:   Lavella Hammock, P.A. Admit Date:  09/17/2001 Discharge Date: 09/18/2001   CC:         Elliot Cousin, M.D.   Referring Physician Discharge [place]  DATE OF BIRTH:  April 18, 1956  PROCEDURES:  None.  HOSPITAL COURSE:  Ms. Hershberger is a 55 year old female with a history of a clean cardiac catheterization at [facility] six years ago.  She came to the emergency room for evaluation of left chest pressure radiating to her left neck and arm for several hours.  The cardiac catheterization she had at [facility] was for similar symptoms.  She had a negative chest CT a month ago for the same symptoms.  She was additionally seen at [facility] in September for similar symptoms and sent home.  There was minor nausea and vague dyspnea but no other associated symptoms.  She was admitted to rule out MI and for further evaluation.  She had negative cardiac enzymes greater than 12 hours after the onset of pain.  Her symptoms resolved.  Her EKG was within normal limits. Because she had no further episodes of pain and was doing well, she was considered stable for discharge with outpatient follow-up.  Upon review of her labs, her potassium was found to be 3.2.  She received an initial supplementation upon arrival of 40 mEq of potassium and was to get another 40 mEq prior to discharge.  It was felt that her symptoms were possibly GI in origin and because of that she had an abdominal ultrasound scheduled.  The abdominal ultrasound was negative.  She was considered stable for discharge on September 18, 2001 p.m.  LABORATORY DATA:  Hemoglobin 10.0, hematocrit 29.8, wbcs 5.0, platelets 244. Sodium 138, potassium 3.2, chloride 107, CO2 27, BUN 11, creatinine 0.6. glucose 103.   Albumin 3.0, calcium 8.3.  Other values within normal limits. Amylase 74, lipase 26.  CK-MB and troponin I negative for MI.  Total cholesterol 126, triglycerides 49, HDL 56, LDL 60.  Total iron 47, total iron binding capacity 248, percent saturation 19.  Chest x-ray:  Negative for acute cardiac or pulmonary process.  DISCHARGE CONDITION:  Stable.  CONSULTS:  None.  COMPLICATIONS:  None.  DISCHARGE DIAGNOSES: 1. Chest pain, negative myocardial infarction by enzymes, abdominal ultrasound    negative as well.  Recent chest CT negative for pulmonary embolus.    Patient is to follow up with an outpatient stress Cardiolite and then be    seen in the office. 2. Hypertension. 3. Hypokalemia. 4. ALLERGIES to CODEINE and TETRACYCLINE. 5. Remote history of cocaine use. 6. Status post hysterectomy. 7. History of cardiac catheterization at [facility] in 1996 that was    normal. 8. CT of the chest on August 23, 2001 that had no acute abnormalities. 9. Anemia.  Patient needs follow-up with Dr. Sherrie Mustache.  DISCHARGE INSTRUCTIONS:  ACTIVITY:  Her activity level is to be as tolerated.  DIET:  She is to stick to a low fat diet.  She is not to use caffeine in her diet.  FOLLOW-UP:  She is to get a stress test on October 05, 2001 at 8 a.m.  She is to see the P.A. for Dr. Dietrich Pates on Monday, October 08, 2001 at 9 a.m.  She is to see Dr. Sherrie Mustache as scheduled.  DISCHARGE MEDICATIONS: 1. HCTZ 25 mg q.d. 2. K-Dur 10 mEq q.d. 3. Coated aspirin 325 mg q.d. Dictated by:   Lavella Hammock, P.A. Attending Physician:  Nelta Numbers DD:  09/18/01 TD:  09/18/01 Job: [id number]

## 2011-03-11 NOTE — Discharge Summary (Signed)
Natalie Chaney, Natalie Chaney                 ACCOUNT NO.:  [id number]   MEDICAL RECORD NO.:  [id number]          PATIENT TYPE:  INP   LOCATION:  2024                         FACILITY:  [facility]   PHYSICIAN:  Jonelle Sidle, M.D. LHCDATE OF BIRTH:  1956/02/15   DATE OF ADMISSION:  10/09/2004  DATE OF DISCHARGE:  10/12/2004                                 DISCHARGE SUMMARY   HISTORY OF PRESENT ILLNESS:  This is a 55 year old female with history of  bipolar disorder and substance abuse.  She apparently uses crack/cocaine on  a regular basis.  The patient presented to [facility] on  October 07, 2004, for evaluation of chest pain.  She was admitted by Dr.  Sherril Croon.  She was seen in consultation by Dr. Diona Browner and arrangements were  made to transfer the patient to [facility] on October 09, 2004,  for further evaluation.  There is a history of two previous cardiac  catheterizations at [facility] several years ago.  Question of  previous cardiac catheterizations at [facility].  The patient's  cardiac enzymes at [facility] were negative.  As noted, the patient has a  history of bipolar disorder.  She has ongoing substance abuse and a history  of hypertension.   ALLERGIES:  TETRACYCLINE, CODEINE and LATEX.   MEDICATIONS:  1.  Nitroglycerin paste.  2.  Phenergan p.r.n.  3.  Anzemet.  4.  Apparently in the past, she was on Paxil and hydrochlorothiazide.   SOCIAL HISTORY:  The patient is married.  She uses crack/cocaine regularly.  She denies any alcohol or tobacco use.   FAMILY HISTORY:  Noncontributory for premature coronary artery disease.   HOSPITAL COURSE:  As noted, this patient was transferred to [facility] on October 09, 2004, from [facility] after she was admitted  there for evaluation of chest pain.  A 2D echocardiogram was performed at  [facility] that showed an ejection fraction of 65%.  The patient also had an  adenosine Cardiolite  that showed global ischemia with LV dilatation with  stress, although there was a question of artifact.  It was felt that cardiac  catheterization was indicated.   The patient did undergo cardiac catheterization on October 11, 2004.  She  was found to have essentially normal coronary arteries with an ejection  fraction of 60%.  There was a question of cocaine induced spasm.  It was  strongly recommended that the patient stop using cocaine and she was treated  with Norvasc for her spasm.  A followup echocardiogram was recommended.  Arrangements were made to discharge the patient on October 12, 2004, in  stable and improved condition.   LABORATORY DATA AND X-RAY FINDINGS:  A CBC on December 19, revealed  hemoglobin 11.1, hematocrit 32.5, WBC 6.2, platelets 309,000.  Chemistry  profile on December 19, revealed BUN 5, creatinine 0.7, potassium 3.6,  sodium 134, glucose 82.  Cardiac enzymes were negative x1.  TSH was 0.232  which was mildly low.  Lipid profile revealed cholesterol of 143,  triglycerides 79, HDL 59, LDL 68.  A chest x-ray showed no active disease.   DISCHARGE MEDICATIONS:  1.  Enteric coated aspirin 81 mg daily.  2.  Lipitor 40 mg daily.  3.  Norvasc 5 mg daily.  4.  Nitroglycerin as needed for chest pain.  5.  Tylenol as needed for other pain.   ACTIVITY:  The patient was told to avoid any strenuous activity or driving  x2 days.  She was not to lift more than 10 pounds for 1 week.   DIET:  Low salt, low fat diet.  She was told to call the office if she had  any increased pain, swelling or bleeding from her groin.   FOLLOW UP:  The patient was told to find a primary care physician in [place].  She was to follow up with the [facility] in [place] on Tuesday,  January 3, at 11 a.m.  She was to have a basic metabolic blood test at her  next office visit.   DISCHARGE DIAGNOSES:  1.  Chest pain, myocardial infarction ruled out.  2.  Cardiac catheterization on  October 11, 2004, revealing normal coronary      arteries, question spasm, ejection fraction 60%.  3.  Previous catheterization performed in 2002, at [facility]      revealing no significant coronary artery disease.  4.  Previous catheterizations at [facility] in 2000, apparently      revealing normal coronary arteries.  5.  History of hypertension.  6.  History of irritable bowel syndrome.  7.  History of depression and questionable bipolar disorder.  8.  Two-dimensional echocardiogram performed at [facility] revealing      ejection fraction 65%.  9.  Adenosine Cardiolite performed at [facility] revealing global      ischemia with left ventricular dilatation with stress.  10. History of cocaine abuse.   SPECIAL INSTRUCTIONS:  The patient was referred to the alcohol and drug  services in [place] and was seen by the social worker here at [facility] with further followup recommended.  As noted, the patient does  have history of bipolar disorder.  She was on Paxil in the past as well as  Depakote.  She was placed on Paxil during this admission, however, it was  decided not to continue this medication at the time of discharge.  It is  recommended that she have psychiatric followup for treatment of depression  and questionable bipolar disorder.       DR/MEDQ  D:  10/12/2004  T:  10/12/2004  Job:  [id number]   cc:   [facility]  [address], [address] [place], [place] [address]   Doreen Beam  [address]  [place]  [place] [address]  Fax: [phone]

## 2011-03-11 NOTE — Discharge Summary (Signed)
[facility]  Patient:    Natalie Chaney, Natalie Chaney Visit Number: [id number] MRN: [id number]          Service Type: MED Location: 3A [id number] 01 Attending Physician:  Beryle Beams Dictated by:   Beryle Beams, M.D. Admit Date:  01/07/2002 Discharge Date: 01/09/2002   CC:         Elliot Cousin, M.D.   Discharge Summary  ADDENDUM  ADMISSION DIAGNOSIS:  Acute gait disorder rule out neuromuscular problems. Acute gait disorder evaluate for acute neuromuscular problems such as Guillain-Barre syndrome.  FINAL DIAGNOSES: 1. Head injury. 2. Gait disorder due to #1. 3. Headaches. 4. Dizziness due to head injury. 5. Urinary tract infection. 6. Dehydration.  HOSPITAL COURSE:  The patient was given IV fluids and pain medications. She was placed on antiemetics and responded fairly well. She was seen by physical therapy. Her examination did not deteriorate. She maintained reflexes and good strength. She did have a weakness of the right leg which was felt to be due to pain at the knee. She did have a positive drug screen for opiates and benzodiazepine. She reportedly was given hydrocodone and other pain medicines preadmission. Urinalysis showed 21-50 wbcs, bacteria many, leukocyte esterase trace, epithelial cells few.  DISCHARGE MEDICATIONS AND INSTRUCTIONS: 1. Bedrest for a week to help with the dizziness. She should do this with    thigh-high TED stockings to prevent DVTs. 2. She should see physical therapy, also a 4-prong walking cane. 3. Percocet 5 mg one to two every 4 hours for pain. 4. She is also to get an x-ray of the left knee. 5. Cipro 500 mg once a day for three additional days. 6. Baclofen 10 mg one to two q.h.s. 7. Follow up with me in one week and also with her primary care physician. Dictated by:   Beryle Beams, M.D. Attending Physician:  Beryle Beams DD:  01/09/02 TD:  01/11/02 Job: [id number]

## 2011-03-11 NOTE — Discharge Summary (Signed)
Natalie Chaney, RABOIN                 ACCOUNT NO.:  [id number]   MEDICAL RECORD NO.:  [id number]          PATIENT TYPE:  INP   LOCATION:  A225                          FACILITY:  [facility]   PHYSICIAN:  Margaretmary Dys, M.D.DATE OF BIRTH:  02-Aug-1956   DATE OF ADMISSION:  11/26/2004  DATE OF DISCHARGE:  02/05/2006LH                                 DISCHARGE SUMMARY   DISCHARGE DIAGNOSIS:  Chest pain of unclear etiology.   ADDITIONAL DIAGNOSES:  1.  History of anemia.  2.  History of cocaine abuse.  3.  Multiple admissions for chest pain in the past with cardiac      catheterizations x2, which were normal.  4.  History of hypertension.   DISCHARGE MEDICATIONS:  Percocet 1-2 tabs p.o. q.4h. p.r.n. as needed for  pain.   ACTIVITY:  No restrictions.   DIET:  No restrictions.   FOLLOW UP:  Follow up as needed with her primary care physician.   HOSPITAL COURSE:  [name] is a 55 year old African-American female who was  admitted to the hospital after a complaint of chest pressure the night  before admission.  She says she had some trouble sleeping, and she reported  pressure on her left side radiating to her back.  She also had some  palpitations and diaphoresis with shortness of breath.  The patient had been  noted to have had multiple admissions in the past for chest pain.  She  reports that this chest pressure was also similar.  She subsequently in the  past has had cardiac caths, which were reported to be normal.  These  concerns for probable coronary artery disease, as the patient reported some  mild relief with sublingual nitroglycerin, she was admitted to the hospital  for evaluation.  During the course of her hospitalization, she had an  uneventful telemetry.  Cardiac enzymes were also negative.  The patient also  received heparin infusion.   Kindly review Dr. Karlyne Greenspan admission history and physical on November 26, 2004.   Patient was seen on November 25, 2004, and she  reported some mild chest pain  with a 5-6/10.  She denies any shortness of breath but had some nausea.  Her  physical exam was unremarkable.  D-dimer was also unremarkable.   On November 28, 2004, the patient was doing much better.  Denied any chest  pain.  Cardiac enzymes were negative.  She was subsequently discharged home  in satisfactory condition.   DIAGNOSTIC TESTING:  Sodium 141, potassium 3.9, chloride 108, CO2 28,  glucose 96, BUN 6, creatinine 0.8, calcium 8.7.  Cardiac enzymes were  negative.  D-dimer was 0.22.  Cholesterol was 145.  Triglycerides 64.  HDL  51.  Serum iron 106, TIBC 267.  Sats were 40%.  Urine for toxicology was  only positive for opiates.   Chest x-ray shows no evidence of acute disease.   A 12-lead EKG on admission was normal sinus rhythm with no acute ST/T  changes.   FOLLOW UP:  Patient is advised to follow up with her primary care physician  as needed.  AM/MEDQ  D:  12/30/2004  T:  12/30/2004  Job:  [id number]

## 2011-03-11 NOTE — Cardiovascular Report (Signed)
[place]. [facility]  Patient:    Natalie Chaney, Natalie Chaney Visit Number: [id number] MRN: [id number]          Service Type: MED Location: 2000 2010 01 Attending Physician:  Glennon Hamilton Dictated by:   Daisey Must, M.D. [facility] Proc. Date: 10/16/01 Admit Date:  10/15/2001 Discharge Date: 10/16/2001   CC:         Elliot Cousin, M.D.  Gerrit Friends. Dietrich Pates, M.D. [facility]  Cardiac Catheterization Lab   Cardiac Catheterization  PROCEDURE PERFORMED:  Left heart catheterization with coronary angiography and left ventriculography.  INDICATIONS:  Ms. Strupp is a 55 year old woman who was admitted with recurrent substernal chest pain.  Because of risk factors and her ongoing chest pain, she was referred for cardiac catheterization to rule out coronary artery disease.  PROCEDURAL NOTE:  A 6-French sheath was placed in the right femoral artery. Coronary angiography was performed with standard Judkins 6-French catheters. Left ventriculography was performed with an angled pigtail catheter.  Contrast was Omnipaque.  There were no complications.  RESULTS:  Hemodynamics:  Left ventricular pressure 114/12.  Aortic pressure 110/82. There is no aortic valve gradient.  Left ventriculogram:  Wall motion is normal.  Ejection fraction calculated at 62%.  There was no mitral regurgitation.  Coronary arteriography (right dominant): 1. The left main is normal. 2. The left anterior descending artery has minor luminal irregularities in the    proximal vessel.  The LAD gives rise to a small first diagonal and a small    to normal sized second diagonal. 3. The left circumflex gives rise to a very large branching ramus intermedius    and a small obtuse marginal branch.  The circumflex is free of angiographic    disease. 4. The right coronary artery has minor luminal irregularities in the proximal    vessel.  The distal right coronary artery gives rise to a normal sized    posterior  descending artery and a small posterolateral branch.  IMPRESSIONS: 1. Normal left ventricular systolic function. 2. No significant coronary artery disease identified.  The patients chest pain appears to be noncardiac. Dictated by:   Daisey Must, M.D. [facility] Attending Physician:  Glennon Hamilton DD:  10/16/01 TD:  10/17/01 Job: [id number]

## 2011-03-11 NOTE — H&P (Signed)
Natalie Chaney, Natalie Chaney                 ACCOUNT NO.:  [id number]   MEDICAL RECORD NO.:  [id number]          PATIENT TYPE:  INP   LOCATION:  IC04                          FACILITY:  [facility]   PHYSICIAN:  Calvert Cantor, M.D.     DATE OF BIRTH:  [birth year]   DATE OF ADMISSION:  11/26/2004  DATE OF DISCHARGE:  LH                                HISTORY & PHYSICAL   PRESENTING COMPLAINT:  Chest pain.   HISTORY OF PRESENT ILLNESS:  This is a 55 year old African-American female  who states that she has been having chest pressure since the night before.  She had some trouble sleeping.  The pressure is on her left side and she  also feels it in her back.  However, she states it did not feel like it is  going through to her back.  She had no associated diaphoresis or shortness  of breath with this pressure, however, she does state that she had some  palpitations.  Currently she is on a Toradol drip and having no chest pain.  She appears to be comfortable.   REVIEW OF SYSTEMS:  Positive for chest pain as above.  No history of  shortness of breath.  She states that she has had a cough for the past week  now and lately she has been coughing up greenish sputum.  She has felt some  fevers and chills, however, she has not taken her temperature.  She feels  nauseous right now and she feels like she has a headache.  All other review  of systems is negative.   PAST MEDICAL HISTORY:  Positive for anemia, hypertension, history of cocaine  use, multiple admissions for chest pain.  The patient has had a cath at  least two times both of which were normal.  No other past medical history.   ALLERGIES:  SHE STATES THAT TETRACYCLINE CAUSES A RASH, CODEINE CAUSES  WELTS.   PAST SURGICAL HISTORY:  She has had a hysterectomy.  She believes that she  has one ovary that was removed and she has one ovary left.   SOCIAL HISTORY:  She is a nonsmoker, nondrinker.  She does not admit to  currently using any cocaine or any  other illegal drugs.  She is separated  from her husband.  She has three children who are alive and healthy and  grown who no longer live with her.  She currently lives with her fiance.   PHYSICAL EXAMINATION:  On admission the patient had a temperature of 98.2,  blood pressure of 150/97, pulse 83, respiratory rate 20, pulse ox 100% on  room air.  The patient was having chest pain which was not completely  relieved with sublingual nitroglycerin.  Therefore, she was started on  Toradol and heparin by Dr. Margretta Ditty.  HEENT:  Atraumatic, normocephalic.  Pupils equal, round, reactive to light  and accommodation.  Extraocular muscles are intact.  Mucosa is moist.  Neck  is supple.  There is no JVD.  She has a regular rate and rhythm, no murmurs.  Lungs are completely clear bilaterally.  Extremities show no cyanosis,  clubbing or edema.  Abdomen is soft, nontender, nondistended, bowel sounds  are positive.   LABORATORY:  Blood work:  Sodium is 141, potassium 3.9, chloride 108, carbon  dioxide 28, glucose 96, BUN 6, creatinine 0.8, calcium 8.7, white count 3.6,  hemoglobin 11.1,  hematocrit 32.1, MCV 81.6, platelets 278.  Her myoglobin  first set was 40.5, second set is 35.4.  CK MB is less than 1.  Troponins  are less than 0.05.  While the patient was in the ER, a heparin bolus was  ordered.  However, accidentally she was given the entire bag of heparin and  subsequently her PTT is above 200.  This has been followed by administration  of protamine sulfate and a follow-up PTT will be done in about 2 hours.   ASSESSMENT AND PLAN:  This is a 55 year old African-American female who has  had multiple admissions for chest pain and has been cath'd in the past with  no significant results.  She has a history of cocaine abuse and it was  believed that she might have had spasms secondary to this cocaine.  We will  get three sets of cardiac enzymes.  Currently she is on heparin and a  Toradol drip at 10  mics.  Therefore, she needs to go the CCU.  In addition  to this, a urine drug screen will be done.  Currently the patient appears to  be comfortable.  If she does rule out for an MI, the patient will be  discharged tomorrow.  I am also getting a lipid profile to check her  cholesterol level and an iron profile because she is anemic.      SR/MEDQ  D:  11/27/2004  T:  11/27/2004  Job:  [id number]   cc:   Rhae Lerner. Margretta Ditty, M.D.  [address]  [place]  [place] [address]

## 2011-03-11 NOTE — H&P (Signed)
[place]. [facility]  Patient:    KHRISTIN, KELEHER Visit Number: [id number] MRN: [id number]          Service Type: MED Location: CCUB 2912 01 Attending Physician:  Glennon Hamilton Dictated by:   Lewayne Bunting, M.D. [facility] Admit Date:  10/15/2001   CC:         Dr. Sherrie Mustache in [place], [place]  Gerrit Friends. Dietrich Pates, M.D. [facility]  in [place], [place]   History and Physical  CHIEF COMPLAINT:  Substernal chest pain x 3 days.  HISTORY OF PRESENT ILLNESS:  Mrs. Zagami is a 55 year old African-American female with a history of prior substernal chest pain with workup at [facility] with negative cardiac catheterization six years ago.  The patient is a reformed cocaine addict and denies using cocaine for the last several years. She did use cocaine for approximately seven years.  She stated that over the last three days that she had persistent substernal chest pain with some radiation in the neck and arm.  There is no real exertional component to her chest pain.  She also reports additional shortness of breath, nausea, and diaphoresis.  She describes her pain as approximately 8 over 10 with some improvement while in the emergency room on nitroglycerin.  Initial electrocardiogram does show T-wave inversion which is new from a prior electrocardiogram a month ago.  The patient did have a workup with a Cardiolite approximately a week ago which was negative per patient report. Initial cardiac enzymes markers in the emergency room are within normal limits.  ALLERGIES:  TETRACYCLINE and CODEINE.  MEDICATIONS: 1. Hydrochlorothiazide 25 mg q.d. 2. K-Dur 10 mEq p.o. q.d. 3. Aspirin 325 mg p.o. 4. Paxil 25 mg p.o. q.d. 5. Iron sulfate 325 mg p.o. b.i.d.  PAST MEDICAL HISTORY:  Anemia.  Status post hysterectomy.  History of colonoscopy, negative recently.  History of cocaine abuse.  History of prior cardiac catheterization at [facility] which was reportedly within  normal limits.  SOCIAL HISTORY:  The patient lives in [place] with her husband.  She does not smoke.  She does not drink alcohol.  As outlined above, she a former cocaine user.  FAMILY HISTORY:  Positive for coronary artery disease in grandfather.  REVIEW OF SYSTEMS:  Occasional sweats and blurred vision.  No rash or skin lesions.  CARDIOPULMONARY:  Positive for chest pain, shortness of breath, dyspnea on exertion, orthopnea, PND, edema, and palpitations.  Questionable left lower extremity claudication.  No frequency or dysuria.  Complaining of generalized weakness and fatigue.  No myalgias or arthralgias.  Occasional nausea and melena.  All other review of systems are within normal limits.  PHYSICAL EXAMINATION:  VITAL SIGNS:  Blood pressure 138/93, respirations are 20k, heart rate is 102 beats per minute.  Temperature is 98.4.  GENERAL:  Well-nourished African-American female in no apparent distress.  HEENT:  NCAT.  PERRLA.  EOMI.  Sclerae are clear.  Oropharynx without erythema or exudate.  NECK:  Supple.  No bruit or JVD.  LYMPH NODES:  Lymphadenopathy none.  CARDIOVASCULAR:  Regular rate and rhythm.  Normal S1 and S2.  PMI is nondisplaced.  There are 2+ pulses bilaterally and equal without bruits.  LUNGS:  Clear breath sounds without wheezing.  SKIN:  No rash or skin lesions.  ABDOMEN:  Soft and nontender.  No hepatosplenomegaly.  GENITOURINARY:  Deferred.  RECTAL:  Notable for negative guaiac stools.  EXTREMITIES:  No clubbing, cyanosis, or edema.  No joint deformities.  NEUROLOGICAL:  The  patient is alert, oriented, and grossly nonfocal.  LABORATORY DATA:  Chest x-ray showed no cardiomegaly, no infiltrate, and essentially within normal limits.  EKG shows heart rate 78 beats per minute. Rhythm is sinus.  T-wave inversion in II, III, aVF, V3, and V6 which are new since September 18, 2001.  There is also QT prolongation.  Hemoglobin 12.7, hematocrit 37.1,  white count 5.6, platelet count 345,000. Sodium 134, potassium 2.9, chloride 100, bicarb is 25, BUN 9, creatinine is 0.7.  LFTs are within normal limits.  BMP level is less than 5. The d-dimer level is less than 0.22.  Total protein and albumin all within normal limits. CK, CK-MB, and troponin levels are within normal limits.  Magnesium is 1.6. INR is 1.0.  IMPRESSION: 1. Substernal chest pain:  The patients symptoms are rather atypical for    unstable angina but her electrocardiogram changes are somewhat worrisome    for ischemia.  The patient has a history of cocaine use and certainly    could have premature atherosclerosis or coronary spasm.  Due to the    fact that the patient had a recent negative Cardiolite scan with ongoing    substernal chest pain, the plan has been agreed upon to proceed with    cardiac catheterization in the morning.  The risks and benefits of this    procedure were discussed with the patient and she is willing to proceed. 2. Hypertension:  Stable. 3. Hypokalemia:  Potassium is 2.9.  It will be replaced. 4. QTC prolongation:  This may be secondary to hypokalemia and relative    hypomagnesemia.  Both electrolytes will be replaced. 5. History of cocaine use:  Drug level will be obtained as the patient denies    cocaine use over the last three years. 6. Anemia:  Appears to be stable with a stable hemoglobin.  PLAN:  Plans to proceed with cardiac catheterization in the morning.  We will hold beta blockers in the mean time but give the patient low dose calcium channel blockers and nitrates as well as heparin. Dictated by:   Lewayne Bunting, M.D. [facility] Attending Physician:  Glennon Hamilton DD:  10/15/01 TD:  10/15/01 Job: [id number]

## 2011-03-11 NOTE — Cardiovascular Report (Signed)
Natalie Chaney, Natalie Chaney                 ACCOUNT NO.:  [id number]   MEDICAL RECORD NO.:  [id number]          PATIENT TYPE:  INP   LOCATION:  2024                         FACILITY:  [facility]   PHYSICIAN:  Arvilla Meres, M.D. [facility] OF BIRTH:  [birth year]   DATE OF PROCEDURE:  10/11/2004  DATE OF DISCHARGE:                              CARDIAC CATHETERIZATION   PRIMARY CARE PHYSICIAN:  None.  She is followed by [facility] in [place].  She is new to [facility].   INDICATIONS:  Ms. Salatino is a 55 year old woman with a previous history of  cocaine-induced chest pain and negative cardiac catheterization in 2002 who  was readmitted with recurrent chest pain in the setting of active cocaine  use.  She underwent Adenosine Cardiolite which was suspicious for global  ischemia.  Thus, she was referred for diagnostic cardiac catheterization to  clearly define her coronary anatomy.   PROCEDURES PERFORMED:  1.  Left heart catheterization.  2.  Left ventriculogram.  3.  Selective coronary angiography.   DESCRIPTION OF PROCEDURE:  Risks and benefits of the procedure were  described to Ms. Issa and consent was signed and placed on the chart.  The  right groin was prepped and draped in routine sterile fashion.  The area was  anesthetized with 1% local lidocaine.  A 5-French arterial sheath was placed  in the right femoral artery using a modified Seldinger technique.  Standard  catheters were used for the procedure including a JL4, JR4, and a bent  pigtail.  All catheter exchanges were made over a wire.  There were no  apparent complications.   FINDINGS:  HEMODYNAMICS:  Aortic pressure is 146/95 with a mean of 116.  LV pressure is 163/0.   CORONARY ANATOMY:  1.  Left main was normal with no angiographic CAD.  2.  LAD:  Large vessel giving off small D1 and small to normal sized D2.      There were several tiny distal diagonals.  There were minor      irregularities in the  proximal section, otherwise no angiographic CAD.  3.  Left circumflex:  Gives rise to a large branching ramus intermedius and      a small AV groove circumflex.  There is no angiographic CAD.  4.  RCA:  This is a dominant vessel with a normal PDA and a small PL.  There      are minor irregularities in the proximal RCA, but otherwise no      angiographic CAD.   LEFT VENTRICULOGRAM:  Left ventriculogram done in the RAO approach shows a  visual estimate of EF of greater than or equal to 60% with no wall motion  abnormalities.  There is no significant mitral regurgitation.   ASSESSMENT/PLAN:  No angiographic CAD despite markedly abnormal functional  study.  Question cocaine-induced spasm versus noncardiac chest pain.  Strongly recommend to her that she stop her cocaine use and follow  up with a doctor for risk factor management.  We suggested considering a  long-acting nitrate versus a calcium channel blocker, particularly possibly  amlodipine 5 mg a day.  She is stable for a discharge after her bed rest  assuming her groin is stable.      Dani   DB/MEDQ  D:  10/11/2004  T:  10/12/2004  Job:  [id number]

## 2013-12-24 DIAGNOSIS — R079 Chest pain, unspecified: Secondary | ICD-10-CM

## 2014-01-02 ENCOUNTER — Emergency Department (HOSPITAL_COMMUNITY): Payer: Medicaid - Out of State

## 2014-01-02 ENCOUNTER — Encounter (HOSPITAL_COMMUNITY): Payer: Self-pay | Admitting: Emergency Medicine

## 2014-01-02 ENCOUNTER — Observation Stay (HOSPITAL_COMMUNITY)
Admission: EM | Admit: 2014-01-02 | Discharge: 2014-01-03 | Disposition: A | Discharge status: Home / Self Care | Payer: Medicaid - Out of State | Attending: Internal Medicine | Admitting: Internal Medicine

## 2014-01-02 DIAGNOSIS — Z91040 Latex allergy status: Secondary | ICD-10-CM | POA: Insufficient documentation

## 2014-01-02 DIAGNOSIS — Z888 Allergy status to other drugs, medicaments and biological substances status: Secondary | ICD-10-CM | POA: Insufficient documentation

## 2014-01-02 DIAGNOSIS — F319 Bipolar disorder, unspecified: Secondary | ICD-10-CM

## 2014-01-02 DIAGNOSIS — IMO0002 Reserved for concepts with insufficient information to code with codable children: Secondary | ICD-10-CM | POA: Insufficient documentation

## 2014-01-02 DIAGNOSIS — I4891 Unspecified atrial fibrillation: Secondary | ICD-10-CM | POA: Diagnosis present

## 2014-01-02 DIAGNOSIS — L74519 Primary focal hyperhidrosis, unspecified: Secondary | ICD-10-CM | POA: Insufficient documentation

## 2014-01-02 DIAGNOSIS — Z8673 Personal history of transient ischemic attack (TIA), and cerebral infarction without residual deficits: Secondary | ICD-10-CM | POA: Insufficient documentation

## 2014-01-02 DIAGNOSIS — Z79899 Other long term (current) drug therapy: Secondary | ICD-10-CM | POA: Insufficient documentation

## 2014-01-02 DIAGNOSIS — E785 Hyperlipidemia, unspecified: Secondary | ICD-10-CM | POA: Diagnosis present

## 2014-01-02 DIAGNOSIS — I10 Essential (primary) hypertension: Secondary | ICD-10-CM

## 2014-01-02 DIAGNOSIS — Z9889 Other specified postprocedural states: Secondary | ICD-10-CM | POA: Insufficient documentation

## 2014-01-02 DIAGNOSIS — R0789 Other chest pain: Principal | ICD-10-CM | POA: Insufficient documentation

## 2014-01-02 DIAGNOSIS — R110 Nausea: Secondary | ICD-10-CM | POA: Insufficient documentation

## 2014-01-02 DIAGNOSIS — Z7901 Long term (current) use of anticoagulants: Secondary | ICD-10-CM | POA: Insufficient documentation

## 2014-01-02 DIAGNOSIS — Z7982 Long term (current) use of aspirin: Secondary | ICD-10-CM | POA: Insufficient documentation

## 2014-01-02 DIAGNOSIS — Z881 Allergy status to other antibiotic agents status: Secondary | ICD-10-CM | POA: Insufficient documentation

## 2014-01-02 DIAGNOSIS — E119 Type 2 diabetes mellitus without complications: Secondary | ICD-10-CM | POA: Diagnosis present

## 2014-01-02 DIAGNOSIS — R079 Chest pain, unspecified: Secondary | ICD-10-CM | POA: Diagnosis present

## 2014-01-02 DIAGNOSIS — Z880 Allergy status to penicillin: Secondary | ICD-10-CM | POA: Insufficient documentation

## 2014-01-02 HISTORY — DX: Type 2 diabetes mellitus without complications: E11.9

## 2014-01-02 HISTORY — DX: Unspecified atrial fibrillation: I48.91

## 2014-01-02 HISTORY — DX: Transient cerebral ischemic attack, unspecified: G45.9

## 2014-01-02 LAB — CBC
HCT: 34.3 % — ABNORMAL LOW (ref 36.0–46.0)
HEMATOCRIT: 35.1 % — AB (ref 36.0–46.0)
HEMOGLOBIN: 11.3 g/dL — AB (ref 12.0–15.0)
Hemoglobin: 11.5 g/dL — ABNORMAL LOW (ref 12.0–15.0)
MCH: 28.4 pg (ref 26.0–34.0)
MCH: 27.2 pg (ref 26.0–34.0)
MCHC: 33.5 g/dL (ref 30.0–36.0)
MCHC: 32.2 g/dL (ref 30.0–36.0)
MCV: 84.7 fL (ref 78.0–100.0)
MCV: 84.6 fL (ref 78.0–100.0)
Platelets: 233 10*3/uL (ref 150–400)
Platelets: 253 10*3/uL (ref 150–400)
RBC: 4.05 MIL/uL (ref 3.87–5.11)
RBC: 4.15 MIL/uL (ref 3.87–5.11)
RDW: 13.7 % (ref 11.5–15.5)
RDW: 13.7 % (ref 11.5–15.5)
WBC: 4.7 10*3/uL (ref 4.0–10.5)
WBC: 5 10*3/uL (ref 4.0–10.5)

## 2014-01-02 LAB — I-STAT TROPONIN, ED
TROPONIN I, POC: 0 ng/mL (ref 0.00–0.08)
Troponin i, poc: 0.02 ng/mL (ref 0.00–0.08)

## 2014-01-02 LAB — D-DIMER, QUANTITATIVE: D-Dimer, Quant: <0.27 ug{FEU}/mL (ref 0.00–0.48)

## 2014-01-02 LAB — PROTIME-INR
INR: 1.6 — AB (ref 0.00–1.49)
Prothrombin Time: 18.6 seconds — ABNORMAL HIGH (ref 11.6–15.2)

## 2014-01-02 LAB — CREATININE, SERUM
CREATININE: 0.63 mg/dL (ref 0.50–1.10)
GFR calc non Af Amer: >90 mL/min (ref >90)

## 2014-01-02 LAB — GLUCOSE, CAPILLARY: Glucose-Capillary: 101 mg/dL — ABNORMAL HIGH (ref 70–99)

## 2014-01-02 LAB — TROPONIN I: Troponin I: <0.3 ng/mL

## 2014-01-02 LAB — BASIC METABOLIC PANEL
BUN: 8 mg/dL (ref 6–23)
CHLORIDE: 102 meq/L (ref 96–112)
CO2: 26 meq/L (ref 19–32)
CREATININE: 0.63 mg/dL (ref 0.50–1.10)
Calcium: 9.2 mg/dL (ref 8.4–10.5)
GFR calc Af Amer: >90 mL/min (ref >90)
GFR calc non Af Amer: >90 mL/min (ref >90)
Glucose, Bld: 87 mg/dL (ref 70–99)
POTASSIUM: 4.7 meq/L (ref 3.7–5.3)
SODIUM: 141 meq/L (ref 137–147)

## 2014-01-02 LAB — CBG MONITORING, ED
Glucose-Capillary: 67 mg/dL — ABNORMAL LOW (ref 70–99)
Glucose-Capillary: 72 mg/dL (ref 70–99)

## 2014-01-02 MED ORDER — NITROGLYCERIN 0.4 MG SL SUBL: 0.4000 mg | SUBLINGUAL_TABLET | SUBLINGUAL | Status: DC | PRN

## 2014-01-02 MED ORDER — WARFARIN - PHYSICIAN DOSING INPATIENT: Freq: Every day | Status: DC

## 2014-01-02 MED ORDER — BUSPIRONE HCL 10 MG PO TABS
10.0000 mg | ORAL_TABLET | Freq: Two times a day (BID) | ORAL | Status: DC
  Administered 2014-01-02 – 2014-01-03 (×2): 10 mg via ORAL
  Filled 2014-01-02 (×3): qty 1

## 2014-01-02 MED ORDER — ACETAMINOPHEN 325 MG PO TABS: 650.0000 mg | ORAL_TABLET | Freq: Four times a day (QID) | ORAL | Status: DC | PRN

## 2014-01-02 MED ORDER — ONDANSETRON HCL 4 MG/2ML IJ SOLN
4.0000 mg | Freq: Four times a day (QID) | INTRAMUSCULAR | Status: DC | PRN
  Administered 2014-01-02 – 2014-01-03 (×2): 4 mg via INTRAVENOUS
  Filled 2014-01-02 (×2): qty 2

## 2014-01-02 MED ORDER — TRAMADOL HCL 50 MG PO TABS
50.0000 mg | ORAL_TABLET | Freq: Four times a day (QID) | ORAL | Status: DC | PRN
  Administered 2014-01-02: 50 mg via ORAL
  Filled 2014-01-02: qty 1

## 2014-01-02 MED ORDER — ENOXAPARIN SODIUM 40 MG/0.4ML ~~LOC~~ SOLN
40.0000 mg | SUBCUTANEOUS | Status: DC
  Administered 2014-01-02: 40 mg via SUBCUTANEOUS
  Filled 2014-01-02 (×2): qty 0.4

## 2014-01-02 MED ORDER — SODIUM CHLORIDE 0.9 % IJ SOLN
3.0000 mL | Freq: Two times a day (BID) | INTRAMUSCULAR | Status: DC
  Administered 2014-01-02 – 2014-01-03 (×2): 3 mL via INTRAVENOUS

## 2014-01-02 MED ORDER — METFORMIN HCL 500 MG PO TABS: 1000.0000 mg | ORAL_TABLET | Freq: Two times a day (BID) | ORAL | Status: DC

## 2014-01-02 MED ORDER — METOPROLOL TARTRATE 25 MG PO TABS
25.0000 mg | ORAL_TABLET | Freq: Two times a day (BID) | ORAL | Status: DC
  Administered 2014-01-02: 25 mg via ORAL
  Filled 2014-01-02 (×3): qty 1

## 2014-01-02 MED ORDER — ALUM & MAG HYDROXIDE-SIMETH 200-200-20 MG/5ML PO SUSP: 30.0000 mL | Freq: Four times a day (QID) | ORAL | Status: DC | PRN

## 2014-01-02 MED ORDER — MORPHINE SULFATE 4 MG/ML IJ SOLN
4.0000 mg | Freq: Once | INTRAMUSCULAR | Status: AC
  Administered 2014-01-02: 4 mg via INTRAVENOUS
  Filled 2014-01-02: qty 1

## 2014-01-02 MED ORDER — ASPIRIN 325 MG PO TABS
325.0000 mg | ORAL_TABLET | Freq: Once | ORAL | Status: AC
  Administered 2014-01-02: 325 mg via ORAL
  Filled 2014-01-02: qty 1

## 2014-01-02 MED ORDER — MORPHINE SULFATE 2 MG/ML IJ SOLN
2.0000 mg | INTRAMUSCULAR | Status: DC | PRN
  Administered 2014-01-02 – 2014-01-03 (×4): 2 mg via INTRAVENOUS
  Filled 2014-01-02 (×4): qty 1

## 2014-01-02 MED ORDER — ACETAMINOPHEN 650 MG RE SUPP: 650.0000 mg | Freq: Four times a day (QID) | RECTAL | Status: DC | PRN

## 2014-01-02 MED ORDER — ARIPIPRAZOLE 5 MG PO TABS
5.0000 mg | ORAL_TABLET | Freq: Every day | ORAL | Status: DC
  Administered 2014-01-03: 5 mg via ORAL
  Filled 2014-01-02: qty 1

## 2014-01-02 MED ORDER — TRAZODONE HCL 100 MG PO TABS
100.0000 mg | ORAL_TABLET | Freq: Every day | ORAL | Status: DC
  Administered 2014-01-02: 100 mg via ORAL
  Filled 2014-01-02 (×2): qty 1

## 2014-01-02 MED ORDER — ONDANSETRON HCL 4 MG PO TABS: 4.0000 mg | ORAL_TABLET | Freq: Four times a day (QID) | ORAL | Status: DC | PRN

## 2014-01-02 MED ORDER — ASPIRIN EC 81 MG PO TBEC
81.0000 mg | DELAYED_RELEASE_TABLET | Freq: Every day | ORAL | Status: DC
Start: 2014-01-03 — End: 2014-01-03
  Administered 2014-01-03: 81 mg via ORAL
  Filled 2014-01-02: qty 1

## 2014-01-02 MED ORDER — MAGNESIUM HYDROXIDE 400 MG/5ML PO SUSP: 30.0000 mL | Freq: Every day | ORAL | Status: DC | PRN

## 2014-01-02 MED ORDER — WARFARIN SODIUM 5 MG PO TABS
5.0000 mg | ORAL_TABLET | Freq: Every day | ORAL | Status: DC
  Filled 2014-01-02: qty 1

## 2014-01-02 MED ORDER — SIMVASTATIN 5 MG PO TABS
5.0000 mg | ORAL_TABLET | Freq: Every day | ORAL | Status: DC
  Filled 2014-01-02: qty 1

## 2014-01-02 MED ORDER — METFORMIN HCL 500 MG PO TABS
1000.0000 mg | ORAL_TABLET | Freq: Two times a day (BID) | ORAL | Status: DC
  Filled 2014-01-02 (×3): qty 2

## 2014-01-02 NOTE — ED Notes (Signed)
Admitting MD at bedside- CBG 67 Gave pt peanut butter and crackers and orange juice. Will transport pt upstairs once CBG rechecked

## 2014-01-02 NOTE — ED Notes (Signed)
Pt reports intermittent left side chest pains and nausea, having dry cough, no sob. Airway intact, ekg done at triage.

## 2014-01-02 NOTE — ED Notes (Signed)
Attempted to obtain labs- unsuccessful. Notified phlebotomy and second RN to attempt.

## 2014-01-02 NOTE — ED Provider Notes (Signed)
Patient seen/examined in the Emergency Department in conjunction with Resident Physician Provider Pippin Patient reports chest pain, exertional in nature Exam : awake/alert, no distress noted Plan: admit for chest pain Pt currently stable  Natalie Gaskinsonald W Korver Graybeal, MD 01/02/14 1559

## 2014-01-02 NOTE — ED Provider Notes (Signed)
CSN: [id number]     Arrival date & time 01/02/14  1346 History   None    Chief Complaint  Patient presents with  . Chest Pain    HPI Natalie AlamoCarol F Cardosa is a 58 y.o. female with h/o Afib, DM, and TIA who presents complaining of chest pain.  She has been having pain for 3-4 days.  The pain is located to her mid chest, retrosternal, and radiates to her back.  It is aching, dull.  It is moderate in severity.  It is associated with nausea and diaphoresis.  It is worsened by exertion, particularly walking up steps.  The pain has been intermittent, lasting minutes at a time.  She has not tried any treatments for this.    She has no history of CAD.  She has never been a smoker.  CAD risk factors include DM.  She has no difficulty breathing, cough, hemptysis, leg swelling, PND, orthopnea.    She thinks she had a cath about 3 years ago in [place] which she states was "normal."    Past Medical History  Diagnosis Date  . Atrial fibrillation   . TIA (transient ischemic attack)   . Diabetes mellitus without complication    History reviewed. No pertinent past surgical history. History reviewed. No pertinent family history. History  Substance Use Topics  . Smoking status: Never Smoker   . Smokeless tobacco: Not on file  . Alcohol Use: No   OB History   Grav Para Term Preterm Abortions TAB SAB Ect Mult Living                 Review of Systems  Constitutional: Positive for diaphoresis. Negative for fever and chills.  HENT: Negative for congestion and rhinorrhea.   Respiratory: Negative for cough, shortness of breath and wheezing.   Cardiovascular: Positive for chest pain. Negative for leg swelling.  Gastrointestinal: Positive for nausea. Negative for vomiting, abdominal pain and diarrhea.  Genitourinary: Negative for dysuria, urgency, frequency, flank pain, vaginal bleeding, vaginal discharge and difficulty urinating.  Musculoskeletal: Negative for neck pain and neck stiffness.  Skin: Negative  for rash.  Neurological: Negative for weakness, numbness and headaches.  All other systems reviewed and are negative.      Allergies  Latex; Nitroglycerin; Penicillins; Tetracyclines & related; and Toradol  Home Medications   Current Outpatient Rx  Name  Route  Sig  Dispense  Refill  . ARIPiprazole (ABILIFY PO)   Oral   Take 1 tablet by mouth daily.         . busPIRone (BUSPAR) 10 MG tablet   Oral   Take 10 mg by mouth 2 (two) times daily.         Marland Kitchen. lovastatin (MEVACOR) 20 MG tablet   Oral   Take 20 mg by mouth at bedtime.         . metFORMIN (GLUCOPHAGE) 1000 MG tablet   Oral   Take 1,000 mg by mouth 2 (two) times daily with a meal.         . metoprolol tartrate (LOPRESSOR) 25 MG tablet   Oral   Take 25 mg by mouth 2 (two) times daily.         . mometasone (NASONEX) 50 MCG/ACT nasal spray   Nasal   Place 2 sprays into the nose daily.         . traZODone (DESYREL) 50 MG tablet   Oral   Take 100 mg by mouth at bedtime.         .Marland Kitchen  warfarin (COUMADIN) 5 MG tablet   Oral   Take 5 mg by mouth daily.         Marland Kitchen aspirin EC 81 MG EC tablet   Oral   Take 1 tablet (81 mg total) by mouth daily.   30 tablet   3   . pantoprazole (PROTONIX) 40 MG tablet   Oral   Take 1 tablet (40 mg total) by mouth 2 (two) times daily before a meal. X 4 weeks, then once daily   60 tablet   3   . traMADol (ULTRAM) 50 MG tablet   Oral   Take 1 tablet (50 mg total) by mouth every 6 (six) hours as needed (pain).   60 tablet   0    BP 115/82  Pulse 68  Temp(Src) 98.6 F (37 C) (Oral)  Resp 16  Wt 185 lb 8 oz (84.142 kg)  SpO2 100% Physical Exam  Nursing note and vitals reviewed. Constitutional: She is oriented to person, place, and time. She appears well-developed and well-nourished. No distress.  HENT:  Head: Normocephalic and atraumatic.  Mouth/Throat: Oropharynx is clear and moist.  Eyes: Conjunctivae and EOM are normal. Pupils are equal, round, and  reactive to light. No scleral icterus.  Neck: Normal range of motion. Neck supple. No JVD present.  Cardiovascular: Normal rate, regular rhythm, normal heart sounds and intact distal pulses.  Exam reveals no gallop and no friction rub.   No murmur heard. Pulses:      Radial pulses are 2+ on the right side, and 2+ on the left side.       Dorsalis pedis pulses are 2+ on the right side, and 2+ on the left side.  Pulmonary/Chest: Effort normal and breath sounds normal. No respiratory distress. She has no wheezes. She has no rales.  Abdominal: Soft. Bowel sounds are normal. She exhibits no distension. There is no tenderness. There is no rebound and no guarding.  Musculoskeletal: She exhibits no edema.  Neurological: She is alert and oriented to person, place, and time. No cranial nerve deficit. She exhibits normal muscle tone. Coordination normal.  Skin: Skin is warm and dry. She is not diaphoretic.    ED Course  Procedures (including critical care time) Labs Review Labs Reviewed  CBC - Abnormal; Notable for the following:    Hemoglobin 11.5 (*)    HCT 34.3 (*)    All other components within normal limits  PROTIME-INR - Abnormal; Notable for the following:    Prothrombin Time 18.6 (*)    INR 1.60 (*)    All other components within normal limits  BASIC METABOLIC PANEL  TROPONIN I   Imaging Review No results found.   EKG Interpretation   Date/Time:  Thursday January 02 2014 13:52:02 EDT Ventricular Rate:  74 PR Interval:  162 QRS Duration: 84 QT Interval:  398 QTC Calculation: 441 R Axis:   57 Text Interpretation:  Normal sinus rhythm Nonspecific T wave abnormality  Abnormal ECG Confirmed by Bebe Shaggy  MD, DONALD ([id number]) on 01/02/2014  3:35:53 PM      MDM   Natalie Chaney is a 58 y.o. female with DM who presents c/o chest pain. Exertional CP, worse with walking up steps, pressure, associated with nausea, diaphoresis.  Radiates to back.  My primary concern is for ACS.  Risk  factors include HTN, HLD, DM2.  She thinks she had a Cath about 3 years ago at another facility.    Normal vitals. Well appearing.  NAD.  Normal cardiopulmonary exam.  EKG with NSR, NS T wave abnormality.  Troponin negative X 1.  Gave ASA.  Pain free after morphine (reports allergy to NTG).  Admitted to medicine for ACS workup/risk stratification.  Doubt PE or aortic dissection. Final diagnoses:  Chest pain       Toney Sang, MD 01/06/14 0800

## 2014-01-02 NOTE — H&P (Addendum)
[facility] History and Physical  Natalie AlamoCarol F Barca UJW:[id number] DOB: 05/03/1956 DOA: 01/02/2014  Referring physician: EDP PCP: Pcp Not In System   Chief Complaint: chest pain  HPI: Natalie Chaney is a 58 y.o. female  With h/o PAF, HTN, DM, HLD presents with left sided chest pressure for 5 minutes while cooking.  PCP and cardiologist in [place]. In [place] to appear before a judge to get disability.  [facility] and in [place].  Pain gone. Not pleuritic. Accompanied by feeling flushed.  Also, has had bilateral calf pain for a few days. No dyspnea.  Has h/o previous cocaine use, but reports none for 6 years.  EKG and troponins unremarkable   Review of Systems:  Systems reviewed. As above. Otherwise negative.  Past Medical History  Diagnosis Date  . Atrial fibrillation   . TIA (transient ischemic attack)   . Diabetes mellitus without complication    History reviewed. No pertinent past surgical history. Social History:  reports that she has never smoked. She does not have any smokeless tobacco history on file. She reports that she does not drink alcohol or use illicit drugs.  Allergies  Allergen Reactions  . Latex   . Nitroglycerin Hives  . Penicillins   . Tetracyclines & Related   . Toradol [Ketorolac Tromethamine]     History reviewed. No pertinent family history.   Prior to Admission medications   Medication Sig Start Date End Date Taking? Authorizing Provider  ARIPiprazole (ABILIFY PO) Take 1 tablet by mouth daily.   Yes Historical Provider, MD  busPIRone (BUSPAR) 10 MG tablet Take 10 mg by mouth 2 (two) times daily.   Yes Historical Provider, MD  LOVASTATIN PO Take 1 tablet by mouth daily.   Yes Historical Provider, MD  metFORMIN (GLUCOPHAGE) 1000 MG tablet Take 1,000 mg by mouth 2 (two) times daily with a meal.   Yes Historical Provider, MD  METOPROLOL TARTRATE PO Take 1 tablet by mouth daily.   Yes Historical Provider, MD  mometasone (NASONEX)  50 MCG/ACT nasal spray Place 2 sprays into the nose daily.   Yes Historical Provider, MD  TRAMADOL HCL PO Take 1 tablet by mouth daily as needed (pain).   Yes Historical Provider, MD  traZODone (DESYREL) 50 MG tablet Take 100 mg by mouth at bedtime.   Yes Historical Provider, MD  warfarin (COUMADIN) 5 MG tablet Take 5 mg by mouth daily.   Yes Historical Provider, MD   Physical Exam: Filed Vitals:   01/02/14 1745  BP: 149/91  Pulse: 109  Temp:   Resp:     BP 149/91  Pulse 109  Temp(Src) 99.2 F (37.3 C) (Oral)  Resp 11  Wt 81.647 kg (180 lb)    BP 143/92  Pulse 67  Temp(Src) 98.2 F (36.8 C) (Oral)  Resp 18  Wt 81.647 kg (180 lb)  SpO2 100%  General Appearance:    Alert, cooperative, no distress, appears stated age  Head:    Normocephalic, without obvious abnormality, atraumatic  Eyes:    PERRL, conjunctiva/corneas clear, EOM's intact, colored contacts     Nose:   Nares normal, septum midline, mucosa normal, no drainage    or sinus tenderness  Throat:   Lips, mucosa, and tongue normal; teeth and gums normal  Neck:   Supple, symmetrical, trachea midline, no adenopathy;    thyroid:  no enlargement/tenderness/nodules; no carotid   bruit or JVD  Back:  Symmetric, no curvature, ROM normal, no CVA tenderness  Lungs:     Clear to auscultation bilaterally, respirations unlabored  Chest Wall:    No tenderness or deformity   Heart:    Regular rate and rhythm, S1 and S2 normal, no murmur, rub   or gallop     Abdomen:     Soft, non-tender, bowel sounds active all four quadrants,    no masses, no organomegaly  Genitalia:    deferred  Rectal:    deferred  Extremities:   Extremities normal, atraumatic, no cyanosis or edema  Pulses:   2+ and symmetric all extremities  Skin:   Skin color, texture, turgor normal, no rashes or lesions  Lymph nodes:   Cervical, supraclavicular, and axillary nodes normal  Neurologic:   CNII-XII intact, normal strength, sensation and reflexes     throughout    Psych: calm, cooperative, normal affect        Labs on Admission:  Basic Metabolic Panel:  Recent Labs Lab 01/02/14 1559  NA 141  K 4.7  CL 102  CO2 26  GLUCOSE 87  BUN 8  CREATININE 0.63  CALCIUM 9.2   Liver Function Tests: No results found for this basename: AST, ALT, ALKPHOS, BILITOT, PROT, ALBUMIN,  in the last 168 hours No results found for this basename: LIPASE, AMYLASE,  in the last 168 hours No results found for this basename: AMMONIA,  in the last 168 hours CBC:  Recent Labs Lab 01/02/14 1559  WBC 4.7  HGB 11.5*  HCT 34.3*  MCV 84.7  PLT 233   Cardiac Enzymes: No results found for this basename: CKTOTAL, CKMB, CKMBINDEX, TROPONINI,  in the last 168 hours  BNP (last 3 results) No results found for this basename: PROBNP,  in the last 8760 hours CBG:  Recent Labs Lab 01/02/14 1806  GLUCAP 67*    Radiological Exams on Admission: Dg Chest 2 View  01/02/2014   CLINICAL DATA:  Chest pain shortness of breath since yesterday  EXAM: CHEST  2 VIEW  COMPARISON:  DG CHEST 1V PORT dated 06/16/2010  FINDINGS: The heart size and mediastinal contours are within normal limits. Both lungs are clear. The visualized skeletal structures are unremarkable.  IMPRESSION: No active cardiopulmonary disease.   Electronically Signed   By: Esperanza Heir M.D.   On: 01/02/2014 15:27    EKG: Normal sinus rhythm Nonspecific T wave abnormality  Assessment/Plan    Chest pain: obs, tele, r/o MI. Also c/o bilateral calf pain. Will check d dimer. If positive, consider CTA chest.   DM type 2 (diabetes mellitus, type 2): monitor CBG, continue metformin   Other and unspecified hyperlipidemia: on statin   Essential hypertension, benign: on metoprolol   Atrial fibrillation: coumadin subtherapeutic   Bipolar disorder, unspecified: cont meds  Note: patient does not know doses of home meds. Have resumed meds AT relatively low dose.  Needs clarification  Code Status:  *full Family Communication:  Disposition Plan: *home  Time spent:  60 min  Giulio Bertino L [facility] Pager [phone]

## 2014-01-03 ENCOUNTER — Observation Stay (HOSPITAL_COMMUNITY): Payer: Medicaid - Out of State

## 2014-01-03 DIAGNOSIS — R079 Chest pain, unspecified: Secondary | ICD-10-CM

## 2014-01-03 DIAGNOSIS — I369 Nonrheumatic tricuspid valve disorder, unspecified: Secondary | ICD-10-CM

## 2014-01-03 LAB — GLUCOSE, CAPILLARY
GLUCOSE-CAPILLARY: 76 mg/dL (ref 70–99)
GLUCOSE-CAPILLARY: 89 mg/dL (ref 70–99)

## 2014-01-03 LAB — TROPONIN I

## 2014-01-03 LAB — PROTIME-INR
INR: 1.96 — AB (ref 0.00–1.49)
Prothrombin Time: 21.7 seconds — ABNORMAL HIGH (ref 11.6–15.2)

## 2014-01-03 LAB — HEMOGLOBIN A1C
Hgb A1c MFr Bld: 5.7 % — ABNORMAL HIGH (ref <5.7)
MEAN PLASMA GLUCOSE: 117 mg/dL — AB (ref <117)

## 2014-01-03 MED ORDER — REGADENOSON 0.4 MG/5ML IV SOLN
0.4000 mg | Freq: Once | INTRAVENOUS | Status: AC
  Administered 2014-01-03: 0.4 mg via INTRAVENOUS
  Filled 2014-01-03: qty 5

## 2014-01-03 MED ORDER — PANTOPRAZOLE SODIUM 40 MG PO TBEC
40.0000 mg | DELAYED_RELEASE_TABLET | Freq: Two times a day (BID) | ORAL | Status: DC
  Administered 2014-01-03: 40 mg via ORAL
  Filled 2014-01-03: qty 1

## 2014-01-03 MED ORDER — PANTOPRAZOLE SODIUM 40 MG IV SOLR: 40.0000 mg | Freq: Two times a day (BID) | INTRAVENOUS | Status: DC

## 2014-01-03 MED ORDER — REGADENOSON 0.4 MG/5ML IV SOLN
INTRAVENOUS | Status: AC
  Filled 2014-01-03: qty 5

## 2014-01-03 MED ORDER — GI COCKTAIL ~~LOC~~
30.0000 mL | Freq: Once | ORAL | Status: AC
  Administered 2014-01-03: 30 mL via ORAL
  Filled 2014-01-03: qty 30

## 2014-01-03 MED ORDER — INSULIN ASPART 100 UNIT/ML ~~LOC~~ SOLN
0.0000 [IU] | Freq: Three times a day (TID) | SUBCUTANEOUS | Status: DC
Start: 2014-01-03 — End: 2014-01-03

## 2014-01-03 MED ORDER — PANTOPRAZOLE SODIUM 40 MG PO TBEC: 40.0000 mg | DELAYED_RELEASE_TABLET | Freq: Two times a day (BID) | ORAL | Status: AC

## 2014-01-03 MED ORDER — REGADENOSON 0.4 MG/5ML IV SOLN
0.4000 mg | Freq: Once | INTRAVENOUS | Status: DC
  Filled 2014-01-03: qty 5

## 2014-01-03 MED ORDER — TECHNETIUM TC 99M SESTAMIBI GENERIC - CARDIOLITE
10.0000 | Freq: Once | INTRAVENOUS | Status: AC | PRN
  Administered 2014-01-03: 10 via INTRAVENOUS

## 2014-01-03 MED ORDER — TECHNETIUM TC 99M SESTAMIBI GENERIC - CARDIOLITE
30.0000 | Freq: Once | INTRAVENOUS | Status: AC | PRN
  Administered 2014-01-03: 30 via INTRAVENOUS

## 2014-01-03 MED ORDER — TRAMADOL HCL 50 MG PO TABS: 50.0000 mg | ORAL_TABLET | Freq: Four times a day (QID) | ORAL | Status: AC | PRN

## 2014-01-03 MED ORDER — ASPIRIN 81 MG PO TBEC: 81.0000 mg | DELAYED_RELEASE_TABLET | Freq: Every day | ORAL | Status: AC

## 2014-01-03 MED ORDER — INSULIN ASPART 100 UNIT/ML ~~LOC~~ SOLN: 0.0000 [IU] | Freq: Every day | SUBCUTANEOUS | Status: DC

## 2014-01-03 NOTE — Progress Notes (Signed)
Cardiac testing reassuring. Normal nuc, EF 56%. Echo with normal LV function, mild LVH, otherwise no significant abnormalities. D/w Dr. Myrtis SerKatz who saw patient this AM - OK to dc from cardiac standpoint. Dayna Dunn PA-C

## 2014-01-03 NOTE — Progress Notes (Signed)
  Echocardiogram 2D Echocardiogram has been performed.  Cathie BeamsGREGORY, Shritha Bresee 01/03/2014, 3:26 PM

## 2014-01-03 NOTE — Discharge Summary (Signed)
Physician Discharge Summary  Patient ID: OASIS GOEHRING MRN: [id number] DOB/AGE: 1956-04-25 58 y.o.  Admit date: 01/02/2014 Discharge date: 01/03/2014  Primary Care Physician:  patient's PCP and cardiologist is in [place] Discharge Diagnoses:    . Chest pain . DM type 2 (diabetes mellitus, type 2) . Other and unspecified hyperlipidemia . Essential hypertension, benign . Atrial fibrillation . Bipolar disorder, unspecified  Consults:  Cardiology, Dr Myrtis Ser   Recommendations for Outpatient Follow-up:  Patient was admitted to followup with her cardiologist and primary physician in [place].   Allergies:   Allergies  Allergen Reactions  . Latex   . Nitroglycerin Hives  . Penicillins   . Tetracyclines & Related   . Toradol [Ketorolac Tromethamine]      Discharge Medications:   Medication List         ABILIFY PO  Take 1 tablet by mouth daily.     aspirin 81 MG EC tablet  Take 1 tablet (81 mg total) by mouth daily.     busPIRone 10 MG tablet  Commonly known as:  BUSPAR  Take 10 mg by mouth 2 (two) times daily.     lovastatin 20 MG tablet  Commonly known as:  MEVACOR  Take 20 mg by mouth at bedtime.     metFORMIN 1000 MG tablet  Commonly known as:  GLUCOPHAGE  Take 1,000 mg by mouth 2 (two) times daily with a meal.     metoprolol tartrate 25 MG tablet  Commonly known as:  LOPRESSOR  Take 25 mg by mouth 2 (two) times daily.     mometasone 50 MCG/ACT nasal spray  Commonly known as:  NASONEX  Place 2 sprays into the nose daily.     pantoprazole 40 MG tablet  Commonly known as:  PROTONIX  Take 1 tablet (40 mg total) by mouth 2 (two) times daily before a meal. X 4 weeks, then once daily     traMADol 50 MG tablet  Commonly known as:  ULTRAM  Take 1 tablet (50 mg total) by mouth every 6 (six) hours as needed (pain).     traZODone 50 MG tablet  Commonly known as:  DESYREL  Take 100 mg by mouth at bedtime.     warfarin 5 MG tablet  Commonly known as:  COUMADIN   Take 5 mg by mouth daily.         Brief H and P: For complete details please refer to admission H and P, but in brief Natalie Chaney is a 58 y.o. female  With h/o PAF, HTN, DM, HLD presents with left sided chest pressure for 5 minutes while cooking. PCP and cardiologist in [place]. In [place] to appear before a judge to get disability. Has had negative cardiac caths in the past at [facility] and in [place]. Pain gone. Not pleuritic. Accompanied by feeling flushed. Also, has had bilateral calf pain for a few days. No dyspnea. Has h/o previous cocaine use, but reports none for 6 years. EKG and troponins unremarkable   Hospital Course:  Chest pain: Patient was having intermittent chest pain episodes of the last 4 days prior to admission and due to still some ongoing chest pain, cardiology was consulted. Risk factors of diabetes, hypertension, hyperlipidemia, atrial fibrillation. D-dimer was negative. Cardiac enzymes negative so far, gave her morphine, as she is allergic to nitroglycerin.  Cardiac enzymes completely remained negative for acute ACS. The care medicine stress test showed EF of 56%, normal wall motion, negative for inducible ischemia. 2-D  echo showed EF of 60%, normal wall motion no regional wall motion abnormalities. The patient was also given GI cocktail with Protonix which did improve some of her symptoms. Patient was recommended to take Protonix twice a day for 4 weeks and follow up with her primary care physician if her symptoms are not improving, for further workup. She was cleared to be discharged home by cardiology  DM type 2 (diabetes mellitus, type 2) the patient was placed on carb modified diet  Other and unspecified hyperlipidemia - Continue statin   Essential hypertension, benign - BP stable   Atrial fibrillation - Continue beta blocker, INR was subtherapeutic, continue Coumadin   Day of Discharge BP 115/82  Pulse 68  Temp(Src) 98.6 F (37 C) (Oral)  Resp 16  Wt 84.142 kg (185 lb 8 oz)   SpO2 100%  Physical Exam: General: Alert and awake oriented x3 not in any acute distress. HEENT: anicteric sclera, pupils reactive to light and accommodation CVS: S1-S2 clear no murmur rubs or gallops Chest: clear to auscultation bilaterally, no wheezing rales or rhonchi Abdomen: soft nontender, nondistended, normal bowel sounds Extremities: no cyanosis, clubbing or edema noted bilaterally Neuro: Cranial nerves II-XII intact, no focal neurological deficits   The results of significant diagnostics from this hospitalization (including imaging, microbiology, ancillary and laboratory) are listed below for reference.    LAB RESULTS: Basic Metabolic Panel:  Recent Labs Lab 01/02/14 1559 01/02/14 2045  NA 141  --   K 4.7  --   CL 102  --   CO2 26  --   GLUCOSE 87  --   BUN 8  --   CREATININE 0.63 0.63  CALCIUM 9.2  --    Liver Function Tests: No results found for this basename: AST, ALT, ALKPHOS, BILITOT, PROT, ALBUMIN,  in the last 168 hours No results found for this basename: LIPASE, AMYLASE,  in the last 168 hours No results found for this basename: AMMONIA,  in the last 168 hours CBC:  Recent Labs Lab 01/02/14 1559 01/02/14 2045  WBC 4.7 5.0  HGB 11.5* 11.3*  HCT 34.3* 35.1*  MCV 84.7 84.6  PLT 233 253   Cardiac Enzymes:  Recent Labs Lab 01/02/14 1846 01/03/14 0100  TROPONINI <0.30 <0.30   BNP: No components found with this basename: POCBNP,  CBG:  Recent Labs Lab 01/02/14 2151 01/03/14 0837  GLUCAP 101* 76    Significant Diagnostic Studies:  Dg Chest 2 View  01/02/2014   CLINICAL DATA:  Chest pain shortness of breath since yesterday  EXAM: CHEST  2 VIEW  COMPARISON:  DG CHEST 1V PORT dated 06/16/2010  FINDINGS: The heart size and mediastinal contours are within normal limits. Both lungs are clear. The visualized skeletal structures are unremarkable.  IMPRESSION: No active cardiopulmonary disease.   Electronically Signed   By: Esperanza Heiraymond  Rubner M.D.    On: 01/02/2014 15:27   Nm Myocar Multi W/spect W/wall Motion / Ef  01/03/2014   CLINICAL DATA:  Chest pain and atrial fibrillation, diabetes  EXAM: MYOCARDIAL IMAGING WITH SPECT (REST AND PHARMACOLOGIC-STRESS)  GATED LEFT VENTRICULAR WALL MOTION STUDY  LEFT VENTRICULAR EJECTION FRACTION  TECHNIQUE: Standard myocardial SPECT imaging was performed after resting intravenous injection of 10 mCi [id number] sestamibi. Subsequently, intravenous infusion of Lexiscan was performed under the supervision of the Cardiology staff. At peak effect of the drug, 30 mCi Tc-4034m sestamibi was injected intravenously and standard myocardial SPECT imaging was performed. Quantitative gated imaging was also performed to evaluate left  ventricular wall motion, and estimate left ventricular ejection fraction.  COMPARISON:  None available  FINDINGS: SPECT: No definite inducible or reversible ischemia with pharmacologic stress. No fixed defects.  Wall motion:  Normal wall motion and thickening  Ejection fraction:  Calculated ejection fraction is 56%.  IMPRESSION: Negative for inducible ischemia.  56% ejection fraction   Electronically Signed   By: Ruel Favors M.D.   On: 01/03/2014 14:28    2D ECHO: Study Conclusions  - Left ventricle: The cavity size was normal. Wall thickness was increased in a pattern of mild LVH. The estimated ejection fraction was 60%. Wall motion was normal; there were no regional wall motion abnormalities. - Right ventricle: The cavity size was normal. Systolic function was normal.    Disposition and Follow-up:     Discharge Orders   Future Orders Complete By Expires   Diet Carb Modified  As directed    Discharge instructions  As directed    Comments:     Please continue protonix twice a day for 4 weeks, then daily. If your chest pain doesnot improve, please discuss with your primary physician in [place] asap. You may need endoscopy for further work-up.   Increase activity slowly  As directed         DISPOSITION: Home DIET: Carb modified diet   DISCHARGE FOLLOW-UP Follow-up Information   Schedule an appointment as soon as possible for a visit in 10 days to follow up. (for hospital follow-up)    Contact information:   your Primary physician in [place]      Time spent on Discharge: 35 minutes  Signed:   RAI,RIPUDEEP M.D. [facility] 01/03/2014, 3:58 PM Pager: [phone]

## 2014-01-03 NOTE — Progress Notes (Addendum)
Pt discharged to home per MD order. Pt received all discharge instructions and medication information including follow-up appointments and prescription information. Pt verbalized understanding. Pt IV and telemetry box removed prior to discharge. Pt alert and oriented at discharge. Pt escorted to private vehicle via wheelchair by nurse tech. Joylene GrapesMonge, Anthonio Mizzell C

## 2014-01-03 NOTE — Progress Notes (Signed)
Patient ID: Natalie Chaney  female  ZOX:[id number]    DOB: 25-Jan-1956    DOA: 01/02/2014  PCP: Pcp Not In System  Assessment/Plan: Principal Problem:   Chest pain: Currently ongoing chest pain at the time of my examination, with the typical features. In factors of diabetes, hypertension, hyperlipidemia, atrial fibrillation. D-dimer negative - Cardiac enzymes negative so far, gave her morphine, as she is allergic to nitroglycerin. Cardiology consulted for further management. 2-D echocardiogram pending. Nuclear medicine stress test today.  Active Problems:   DM type 2 (diabetes mellitus, type 2) -N.p.o. for now until the stress test is done     Other and unspecified hyperlipidemia - Continue statin    Essential hypertension, benign - BP stable    Atrial fibrillation - Continue beta blocker, INR was subtherapeutic, continue Coumadin    Bipolar disorder, unspecified   DVT Prophylaxis:  Code Status:  Family Communication:Discussed with patient in detail, alert and oriented  Disposition:Waiting for 2-D echo and stress test results  Consultants:  Cardiology  Procedures:  stress test  Antibiotics:  None    Subjective: At the time of my examination, still complaining of chest pain, 4/10 with mild shortness of breath,   Objective: Weight change:  No intake or output data in the 24 hours ending 01/03/14 1325 Blood pressure 112/82, pulse 94, temperature 98 F (36.7 C), temperature source Oral, resp. rate 18, weight 84.142 kg (185 lb 8 oz), SpO2 98.00%.  Physical Exam: General: Alert and awake, oriented x3, not in any acute distress. CVS: S1-S2 clear, no murmur rubs or gallops Chest: clear to auscultation bilaterally, no wheezing, rales or rhonchi no chest wall tenderness  Abdomen: soft nontender, nondistended, normal bowel sounds  Extremities: no cyanosis, clubbing or edema noted bilaterally Neuro: Cranial nerves II-XII intact, no focal neurological deficits  Lab  Results: Basic Metabolic Panel:  Recent Labs Lab 01/02/14 1559 01/02/14 2045  NA 141  --   K 4.7  --   CL 102  --   CO2 26  --   GLUCOSE 87  --   BUN 8  --   CREATININE 0.63 0.63  CALCIUM 9.2  --    Liver Function Tests: No results found for this basename: AST, ALT, ALKPHOS, BILITOT, PROT, ALBUMIN,  in the last 168 hours No results found for this basename: LIPASE, AMYLASE,  in the last 168 hours No results found for this basename: AMMONIA,  in the last 168 hours CBC:  Recent Labs Lab 01/02/14 1559 01/02/14 2045  WBC 4.7 5.0  HGB 11.5* 11.3*  HCT 34.3* 35.1*  MCV 84.7 84.6  PLT 233 253   Cardiac Enzymes:  Recent Labs Lab 01/02/14 1846 01/03/14 0100  TROPONINI <0.30 <0.30   BNP: No components found with this basename: POCBNP,  CBG:  Recent Labs Lab 01/02/14 1806 01/02/14 1823 01/02/14 2151 01/03/14 0837  GLUCAP 67* 72 101* 76     Micro Results: No results found for this or any previous visit (from the past 240 hour(s)).  Studies/Results: Dg Chest 2 View  01/02/2014   CLINICAL DATA:  Chest pain shortness of breath since yesterday  EXAM: CHEST  2 VIEW  COMPARISON:  DG CHEST 1V PORT dated 06/16/2010  FINDINGS: The heart size and mediastinal contours are within normal limits. Both lungs are clear. The visualized skeletal structures are unremarkable.  IMPRESSION: No active cardiopulmonary disease.   Electronically Signed   By: Esperanza Heir M.D.   On: 01/02/2014 15:27  Medications: Scheduled Meds: . ARIPiprazole  5 mg Oral Daily  . aspirin EC  81 mg Oral Daily  . busPIRone  10 mg Oral BID  . enoxaparin (LOVENOX) injection  40 mg Subcutaneous Q24H  . insulin aspart  0-5 Units Subcutaneous QHS  . insulin aspart  0-9 Units Subcutaneous TID WC  . metoprolol tartrate  25 mg Oral BID  . regadenoson      . regadenoson  0.4 mg Intravenous Once  . simvastatin  5 mg Oral q1800  . sodium chloride  3 mL Intravenous Q12H  . traZODone  100 mg Oral QHS  .  warfarin  5 mg Oral q1800  . Warfarin - Physician Dosing Inpatient   Does not apply q1800      LOS: 1 day   Ambrie Carte M.D. [facility] 01/03/2014, 1:25 PM Pager: [phone]  If 7PM-7AM, please contact night-coverage [email or web address] Password [email or web address]

## 2014-01-03 NOTE — Progress Notes (Signed)
Lexiscan Cardiolite performed without complication. Annali Lybrand PA-C

## 2014-01-03 NOTE — Progress Notes (Signed)
UR completed 

## 2014-01-03 NOTE — Consult Note (Signed)
CARDIOLOGY CONSULT NOTE   Patient ID: Natalie Chaney MRN: [id number] DOB/AGE: 03/21/1956 58 y.o.  Admit Date: 01/02/2014  Primary Physician: Pcp Not In System  Primary Cardiologist  Cardiologist is [place]   Clinical Summary Natalie Chaney is a 58 y.o.female. She has risk factors for coronary disease. Old records show that she has undergone catheterization on several occasions in the past. At least one of these studies was done here at [facility]. In 2005 her coronaries were normal. There is a history of paroxysmal atrial fibrillation and she is on Coumadin. I do not have any of the specifics concerning her atrial fib. Currently she is in sinus rhythm. In the past there was a history of drug abuse. I cannot comment on any aspects of drug use at this time.  The patient was in town for legal review for disability. She's been having intermittent chest pain for several days. She came to the hospital and she was admitted. Morphine seems to help her pain. EKG so far have shown no change. Her troponins are normal.   Allergies  Allergen Reactions  . Latex   . Nitroglycerin Hives  . Penicillins   . Tetracyclines & Related   . Toradol [Ketorolac Tromethamine]     Medications Scheduled Medications: . ARIPiprazole  5 mg Oral Daily  . aspirin EC  81 mg Oral Daily  . busPIRone  10 mg Oral BID  . enoxaparin (LOVENOX) injection  40 mg Subcutaneous Q24H  . insulin aspart  0-5 Units Subcutaneous QHS  . insulin aspart  0-9 Units Subcutaneous TID WC  . metoprolol tartrate  25 mg Oral BID  . simvastatin  5 mg Oral q1800  . sodium chloride  3 mL Intravenous Q12H  . traZODone  100 mg Oral QHS  . warfarin  5 mg Oral q1800  . Warfarin - Physician Dosing Inpatient   Does not apply q1800     Infusions:     PRN Medications:  acetaminophen, acetaminophen, alum & mag hydroxide-simeth, magnesium hydroxide, morphine injection, ondansetron (ZOFRAN) IV, ondansetron, traMADol   Past Medical History    Diagnosis Date  . Atrial fibrillation   . TIA (transient ischemic attack)   . Diabetes mellitus without complication     History reviewed. No pertinent past surgical history.  History reviewed. No pertinent family history.  Social History Natalie Chaney reports that she has never smoked. She does not have any smokeless tobacco history on file. Natalie Chaney reports that she does not drink alcohol.  Review of Systems  Patient denies fever, chills, headache, sweats, rash, change in vision, change in hearing, cough, nausea vomiting, urinary symptoms. All other systems are reviewed and are negative.  Physical Examination Blood pressure 110/73, pulse 63, temperature 98 F (36.7 C), temperature source Oral, resp. rate 18, weight 185 lb 8 oz (84.142 kg), SpO2 98.00%. No intake or output data in the 24 hours ending 01/03/14 0841   Prior Cardiac Testing/Procedures  Lab Results  Basic Metabolic Panel:  Recent Labs Lab 01/02/14 1559 01/02/14 2045  NA 141  --   K 4.7  --   CL 102  --   CO2 26  --   GLUCOSE 87  --   BUN 8  --   CREATININE 0.63 0.63  CALCIUM 9.2  --     Liver Function Tests: No results found for this basename: AST, ALT, ALKPHOS, BILITOT, PROT, ALBUMIN,  in the last 168 hours  CBC:  Recent Labs Lab 01/02/14 1559  01/02/14 2045  WBC 4.7 5.0  HGB 11.5* 11.3*  HCT 34.3* 35.1*  MCV 84.7 84.6  PLT 233 253    Cardiac Enzymes:  Recent Labs Lab 01/02/14 1846 01/03/14 0100  TROPONINI <0.30 <0.30    BNP: No components found with this basename: POCBNP,    Radiology: Dg Chest 2 View  01/02/2014   CLINICAL DATA:  Chest pain shortness of breath since yesterday  EXAM: CHEST  2 VIEW  COMPARISON:  DG CHEST 1V PORT dated 06/16/2010  FINDINGS: The heart size and mediastinal contours are within normal limits. Both lungs are clear. The visualized skeletal structures are unremarkable.  IMPRESSION: No active cardiopulmonary disease.   Electronically Signed   By: Esperanza Heiraymond   Rubner M.D.   On: 01/02/2014 15:27    ECG:    I reviewed the EKG on admission an EKG that was done in the past few minutes while she's having pain. Both EKGs are normal.   Impression and Recommendations    Chest pain     At this point the patient is having recurrent chest pain in the hospital. Troponins are normal. EKGs are normal. Her pain does not appear to be pleuritic in nature. There is no suggestion of pericarditis. I'm recommending that we proceed with 2-D echo to assess LV function. Am also recommending that we proceed with a nuclear stress study. This can be done even though she's having chest discomfort. She has discomfort during her rest image, the data will be very helpful. If both the echo and stress study are normal, no further workup will be needed. The patient could be discharged home with follow up with her cardiologist at home.    DM type 2 (diabetes mellitus, type 2)   Other and unspecified hyperlipidemia   Essential hypertension, benign    Atrial fibrillation    The patient gives a history of atrial fibrillation. She is on Coumadin. There is question of a TIA, she is in sinus rhythm at this time. The plan will be to continue her Coumadin with no further workup at this time.    Bipolar disorder, unspecified  Jerral BonitoJeff Ommie Degeorge, MD  01/03/2014, 8:41 AM

## 2014-01-06 NOTE — ED Provider Notes (Signed)
I have personally seen and examined the patient.  I have discussed the plan of care with the resident.  I have reviewed the documentation on PMH/FH/Soc. History.  I have reviewed the documentation of the resident and agree.   Joya Gaskinsonald W Shahana Capes, MD 01/06/14 [phone]

## 2014-12-31 DIAGNOSIS — R0789 Other chest pain: Secondary | ICD-10-CM

## 2015-01-01 DIAGNOSIS — R079 Chest pain, unspecified: Secondary | ICD-10-CM

## 2015-06-29 DIAGNOSIS — I4891 Unspecified atrial fibrillation: Secondary | ICD-10-CM

## 2015-06-29 DIAGNOSIS — R079 Chest pain, unspecified: Secondary | ICD-10-CM

## 2015-06-29 DIAGNOSIS — I10 Essential (primary) hypertension: Secondary | ICD-10-CM

## 2015-07-02 ENCOUNTER — Encounter (INDEPENDENT_AMBULATORY_CARE_PROVIDER_SITE_OTHER): Payer: Self-pay | Admitting: Cardiovascular Disease

## 2015-10-25 HISTORY — PX: CARDIAC CATHETERIZATION: SHX172

## 2015-12-01 ENCOUNTER — Encounter (INDEPENDENT_AMBULATORY_CARE_PROVIDER_SITE_OTHER): Payer: Self-pay | Admitting: Cardiovascular Disease

## 2015-12-01 DIAGNOSIS — I639 Cerebral infarction, unspecified: Secondary | ICD-10-CM

## 2015-12-02 DIAGNOSIS — G459 Transient cerebral ischemic attack, unspecified: Secondary | ICD-10-CM

## 2015-12-03 ENCOUNTER — Encounter (INDEPENDENT_AMBULATORY_CARE_PROVIDER_SITE_OTHER): Payer: Self-pay | Admitting: Cardiovascular Disease

## 2015-12-18 ENCOUNTER — Encounter (INDEPENDENT_AMBULATORY_CARE_PROVIDER_SITE_OTHER): Payer: Self-pay | Admitting: Cardiovascular Disease

## 2015-12-19 DIAGNOSIS — I6789 Other cerebrovascular disease: Secondary | ICD-10-CM

## 2016-02-01 ENCOUNTER — Encounter (INDEPENDENT_AMBULATORY_CARE_PROVIDER_SITE_OTHER): Payer: Self-pay | Admitting: Cardiovascular Disease

## 2016-02-01 DIAGNOSIS — I480 Paroxysmal atrial fibrillation: Secondary | ICD-10-CM

## 2016-02-01 DIAGNOSIS — R0789 Other chest pain: Secondary | ICD-10-CM

## 2016-02-01 DIAGNOSIS — I77810 Thoracic aortic ectasia: Secondary | ICD-10-CM

## 2016-02-02 ENCOUNTER — Encounter (INDEPENDENT_AMBULATORY_CARE_PROVIDER_SITE_OTHER): Payer: Self-pay | Admitting: Cardiovascular Disease

## 2016-03-08 DIAGNOSIS — R0789 Other chest pain: Secondary | ICD-10-CM

## 2016-03-09 DIAGNOSIS — I10 Essential (primary) hypertension: Secondary | ICD-10-CM

## 2016-03-09 DIAGNOSIS — R079 Chest pain, unspecified: Secondary | ICD-10-CM

## 2016-03-09 DIAGNOSIS — R9439 Abnormal result of other cardiovascular function study: Secondary | ICD-10-CM

## 2016-06-19 DIAGNOSIS — I480 Paroxysmal atrial fibrillation: Secondary | ICD-10-CM

## 2016-06-19 DIAGNOSIS — R079 Chest pain, unspecified: Secondary | ICD-10-CM

## 2016-11-08 ENCOUNTER — Encounter (INDEPENDENT_AMBULATORY_CARE_PROVIDER_SITE_OTHER): Payer: Self-pay | Admitting: Cardiovascular Disease

## 2017-05-24 ENCOUNTER — Encounter (INDEPENDENT_AMBULATORY_CARE_PROVIDER_SITE_OTHER): Payer: Self-pay | Admitting: Endocrinology, Diabetes and Metabolism

## 2018-08-21 ENCOUNTER — Inpatient Hospital Stay
Admission: RE | Admit: 2018-08-21 | Discharge: 2018-08-29 | DRG: 057 | Disposition: A | Discharge status: Home Health | Payer: Medicare Other | Source: Other Acute Inpatient Hospital | Attending: Physical Medicine & Rehabilitation | Admitting: Physical Medicine & Rehabilitation

## 2018-08-21 DIAGNOSIS — I69354 Hemiplegia and hemiparesis following cerebral infarction affecting left non-dominant side: Principal | ICD-10-CM

## 2018-08-21 DIAGNOSIS — R51 Headache: Secondary | ICD-10-CM | POA: Diagnosis present

## 2018-08-21 DIAGNOSIS — T426X5A Adverse effect of other antiepileptic and sedative-hypnotic drugs, initial encounter: Secondary | ICD-10-CM | POA: Diagnosis not present

## 2018-08-21 DIAGNOSIS — M7918 Myalgia, other site: Secondary | ICD-10-CM | POA: Diagnosis present

## 2018-08-21 DIAGNOSIS — I69398 Other sequelae of cerebral infarction: Secondary | ICD-10-CM

## 2018-08-21 DIAGNOSIS — L299 Pruritus, unspecified: Secondary | ICD-10-CM | POA: Diagnosis not present

## 2018-08-21 DIAGNOSIS — R0789 Other chest pain: Secondary | ICD-10-CM | POA: Diagnosis present

## 2018-08-21 DIAGNOSIS — R1310 Dysphagia, unspecified: Secondary | ICD-10-CM | POA: Diagnosis present

## 2018-08-21 DIAGNOSIS — E785 Hyperlipidemia, unspecified: Secondary | ICD-10-CM | POA: Diagnosis present

## 2018-08-21 DIAGNOSIS — I480 Paroxysmal atrial fibrillation: Secondary | ICD-10-CM | POA: Diagnosis present

## 2018-08-21 DIAGNOSIS — D649 Anemia, unspecified: Secondary | ICD-10-CM | POA: Diagnosis present

## 2018-08-21 DIAGNOSIS — K5900 Constipation, unspecified: Secondary | ICD-10-CM | POA: Diagnosis present

## 2018-08-21 DIAGNOSIS — R079 Chest pain, unspecified: Secondary | ICD-10-CM

## 2018-08-21 DIAGNOSIS — E119 Type 2 diabetes mellitus without complications: Secondary | ICD-10-CM | POA: Diagnosis present

## 2018-08-21 DIAGNOSIS — I69391 Dysphagia following cerebral infarction: Secondary | ICD-10-CM

## 2018-08-21 DIAGNOSIS — I639 Cerebral infarction, unspecified: Secondary | ICD-10-CM | POA: Diagnosis present

## 2018-08-21 DIAGNOSIS — E669 Obesity, unspecified: Secondary | ICD-10-CM | POA: Diagnosis present

## 2018-08-21 DIAGNOSIS — J449 Chronic obstructive pulmonary disease, unspecified: Secondary | ICD-10-CM | POA: Diagnosis present

## 2018-08-21 DIAGNOSIS — R200 Anesthesia of skin: Secondary | ICD-10-CM | POA: Diagnosis present

## 2018-08-21 DIAGNOSIS — F319 Bipolar disorder, unspecified: Secondary | ICD-10-CM | POA: Diagnosis present

## 2018-08-21 DIAGNOSIS — I10 Essential (primary) hypertension: Secondary | ICD-10-CM | POA: Diagnosis present

## 2018-08-21 DIAGNOSIS — R569 Unspecified convulsions: Secondary | ICD-10-CM | POA: Diagnosis present

## 2018-08-21 DIAGNOSIS — R278 Other lack of coordination: Secondary | ICD-10-CM | POA: Diagnosis present

## 2018-08-21 DIAGNOSIS — I351 Nonrheumatic aortic (valve) insufficiency: Secondary | ICD-10-CM | POA: Diagnosis present

## 2018-08-21 DIAGNOSIS — I69319 Unspecified symptoms and signs involving cognitive functions following cerebral infarction: Secondary | ICD-10-CM

## 2018-08-21 DIAGNOSIS — Z6835 Body mass index (BMI) 35.0-35.9, adult: Secondary | ICD-10-CM

## 2018-08-21 DIAGNOSIS — R4702 Dysphasia: Secondary | ICD-10-CM | POA: Diagnosis present

## 2018-08-21 HISTORY — DX: Hyperlipidemia, unspecified: E78.5

## 2018-08-21 HISTORY — DX: Type 2 diabetes mellitus without complications: E11.9

## 2018-08-21 HISTORY — DX: Unspecified asthma, uncomplicated: J45.909

## 2018-08-21 HISTORY — DX: Essential (primary) hypertension: I10

## 2018-08-21 LAB — CBC
Absolute NRBC: 0 10*3/uL (ref 0.00–0.00)
Hematocrit: 30.9 % — ABNORMAL LOW (ref 34.7–43.7)
Hgb: 9.8 g/dL — ABNORMAL LOW (ref 11.4–14.8)
MCH: 26.6 pg (ref 25.1–33.5)
MCHC: 31.7 g/dL (ref 31.5–35.8)
MCV: 83.7 fL (ref 78.0–96.0)
MPV: 9.5 fL (ref 8.9–12.5)
Nucleated RBC: 0 /100 WBC (ref 0.0–0.0)
Platelets: 297 10*3/uL (ref 142–346)
RBC: 3.69 10*6/uL — ABNORMAL LOW (ref 3.90–5.10)
RDW: 14 % (ref 11–15)
WBC: 6.12 10*3/uL (ref 3.10–9.50)

## 2018-08-21 MED ORDER — ALBUTEROL SULFATE HFA 108 (90 BASE) MCG/ACT IN AERS
1.00 | INHALATION_SPRAY | RESPIRATORY_TRACT | Status: DC
Start: ? — End: 2018-08-21

## 2018-08-21 MED ORDER — MAGNESIUM HYDROXIDE 400 MG/5ML PO SUSP
30.00 mL | ORAL | Status: DC
Start: ? — End: 2018-08-21

## 2018-08-21 MED ORDER — ALBUTEROL SULFATE (2.5 MG/3ML) 0.083% IN NEBU
2.50 mg | INHALATION_SOLUTION | RESPIRATORY_TRACT | Status: DC
Start: ? — End: 2018-08-21

## 2018-08-21 MED ORDER — ACETAMINOPHEN 325 MG PO TABS
650.0000 mg | ORAL_TABLET | ORAL | Status: DC | PRN
Start: 2018-08-21 — End: 2018-08-29
  Administered 2018-08-22 – 2018-08-27 (×6): 650 mg via ORAL
  Filled 2018-08-21 (×6): qty 2

## 2018-08-21 MED ORDER — LAMOTRIGINE 100 MG PO TABS
150.0000 mg | ORAL_TABLET | Freq: Two times a day (BID) | ORAL | Status: DC
Start: 2018-08-21 — End: 2018-08-29
  Administered 2018-08-21 – 2018-08-29 (×16): 150 mg via ORAL
  Filled 2018-08-21 (×16): qty 2

## 2018-08-21 MED ORDER — POTASSIUM CHLORIDE CRYS ER 10 MEQ PO TBCR
10.00 meq | EXTENDED_RELEASE_TABLET | ORAL | Status: DC
Start: 2018-08-22 — End: 2018-08-21

## 2018-08-21 MED ORDER — ZOLPIDEM TARTRATE 5 MG PO TABS
5.00 mg | ORAL_TABLET | ORAL | Status: DC
Start: ? — End: 2018-08-21

## 2018-08-21 MED ORDER — METOPROLOL SUCCINATE ER 25 MG PO TB24
25.0000 mg | ORAL_TABLET | Freq: Every day | ORAL | Status: DC
Start: 2018-08-22 — End: 2018-08-29
  Administered 2018-08-22 – 2018-08-29 (×8): 25 mg via ORAL
  Filled 2018-08-21 (×8): qty 1

## 2018-08-21 MED ORDER — GENERIC EXTERNAL MEDICATION
650.00 mg | Status: DC
Start: ? — End: 2018-08-21

## 2018-08-21 MED ORDER — OMEPRAZOLE 20 MG PO CPDR
40.00 mg | DELAYED_RELEASE_CAPSULE | ORAL | Status: DC
Start: 2018-08-22 — End: 2018-08-21

## 2018-08-21 MED ORDER — GENERIC EXTERNAL MEDICATION
150.00 mg | Status: DC
Start: 2018-08-21 — End: 2018-08-21

## 2018-08-21 MED ORDER — FLUTICASONE FUROATE-VILANTEROL 100-25 MCG/INH IN AEPB
1.00 | INHALATION_SPRAY | Freq: Every morning | RESPIRATORY_TRACT | Status: DC
Start: 2018-08-22 — End: 2018-08-29
  Administered 2018-08-22 – 2018-08-29 (×8): 1 via RESPIRATORY_TRACT
  Filled 2018-08-21: qty 14

## 2018-08-21 MED ORDER — GENERIC EXTERNAL MEDICATION
10.00 mL | Status: DC
Start: ? — End: 2018-08-21

## 2018-08-21 MED ORDER — LURASIDONE HCL 40 MG PO TABS
20.0000 mg | ORAL_TABLET | Freq: Every evening | ORAL | Status: DC
Start: 2018-08-21 — End: 2018-08-24
  Administered 2018-08-23: 20 mg via ORAL

## 2018-08-21 MED ORDER — QUETIAPINE FUMARATE 25 MG PO TABS
50.00 mg | ORAL_TABLET | ORAL | Status: DC
Start: 2018-08-22 — End: 2018-08-21

## 2018-08-21 MED ORDER — LURASIDONE HCL 40 MG PO TABS
20.00 mg | ORAL_TABLET | ORAL | Status: DC
Start: 2018-08-22 — End: 2018-08-21

## 2018-08-21 MED ORDER — FLUTICASONE FUROATE-VILANTEROL 200-25 MCG/INH IN AEPB
1.00 | INHALATION_SPRAY | RESPIRATORY_TRACT | Status: DC
Start: 2018-08-22 — End: 2018-08-21

## 2018-08-21 MED ORDER — ATORVASTATIN CALCIUM 40 MG PO TABS
40.0000 mg | ORAL_TABLET | Freq: Every evening | ORAL | Status: DC
Start: 2018-08-21 — End: 2018-08-29
  Administered 2018-08-21 – 2018-08-28 (×8): 40 mg via ORAL
  Filled 2018-08-21 (×8): qty 1

## 2018-08-21 MED ORDER — ALBUTEROL SULFATE (2.5 MG/3ML) 0.083% IN NEBU
2.5000 mg | INHALATION_SOLUTION | Freq: Four times a day (QID) | RESPIRATORY_TRACT | Status: DC | PRN
Start: 2018-08-21 — End: 2018-08-29
  Administered 2018-08-29: 2.5 mg via RESPIRATORY_TRACT
  Filled 2018-08-21 (×2): qty 3

## 2018-08-21 MED ORDER — PANTOPRAZOLE SODIUM 40 MG PO TBEC
40.0000 mg | DELAYED_RELEASE_TABLET | Freq: Two times a day (BID) | ORAL | Status: DC
Start: 2018-08-21 — End: 2018-08-29
  Administered 2018-08-21 – 2018-08-29 (×16): 40 mg via ORAL
  Filled 2018-08-21 (×17): qty 1

## 2018-08-21 MED ORDER — DABIGATRAN ETEXILATE MESYLATE 150 MG PO CAPS
150.00 mg | ORAL_CAPSULE | Freq: Two times a day (BID) | ORAL | Status: DC
Start: 2018-08-21 — End: 2018-08-29
  Administered 2018-08-21 – 2018-08-29 (×16): 150 mg via ORAL
  Filled 2018-08-21 (×16): qty 1

## 2018-08-21 MED ORDER — ONDANSETRON 4 MG PO TBDP
4.00 mg | ORAL_TABLET | Freq: Four times a day (QID) | ORAL | Status: DC | PRN
Start: 2018-08-21 — End: 2018-08-29
  Administered 2018-08-21 – 2018-08-28 (×7): 4 mg via ORAL
  Filled 2018-08-21 (×7): qty 1

## 2018-08-21 MED ORDER — POTASSIUM CHLORIDE CRYS ER 20 MEQ PO TBCR
10.00 meq | EXTENDED_RELEASE_TABLET | Freq: Once | ORAL | Status: AC
Start: 2018-08-22 — End: 2018-08-22
  Administered 2018-08-22: 10 meq via ORAL
  Filled 2018-08-21: qty 1

## 2018-08-21 MED ORDER — ROPINIROLE HCL 1 MG PO TABS
1.00 mg | ORAL_TABLET | Freq: Every evening | ORAL | Status: DC
Start: 2018-08-21 — End: 2018-08-29
  Administered 2018-08-21 – 2018-08-28 (×8): 1 mg via ORAL
  Filled 2018-08-21 (×9): qty 1

## 2018-08-21 MED ORDER — LACTULOSE 10 GM/15ML PO SOLN
20.00 g | Freq: Two times a day (BID) | ORAL | Status: DC | PRN
Start: 2018-08-21 — End: 2018-08-29
  Administered 2018-08-22 – 2018-08-28 (×6): 20 g via ORAL
  Filled 2018-08-21 (×6): qty 30

## 2018-08-21 MED ORDER — TRAMADOL HCL 50 MG PO TABS
50.00 mg | ORAL_TABLET | Freq: Two times a day (BID) | ORAL | Status: DC | PRN
Start: 2018-08-21 — End: 2018-08-29
  Administered 2018-08-21 – 2018-08-29 (×14): 50 mg via ORAL
  Filled 2018-08-21 (×14): qty 1

## 2018-08-21 MED ORDER — ROPINIROLE HCL 1 MG PO TABS
1.00 mg | ORAL_TABLET | ORAL | Status: DC
Start: 2018-08-21 — End: 2018-08-21

## 2018-08-21 MED ORDER — QUETIAPINE FUMARATE 25 MG PO TABS
50.00 mg | ORAL_TABLET | Freq: Every evening | ORAL | Status: DC
Start: 2018-08-21 — End: 2018-08-29
  Administered 2018-08-21 – 2018-08-28 (×8): 50 mg via ORAL
  Filled 2018-08-21 (×8): qty 2

## 2018-08-21 MED ORDER — FUROSEMIDE 20 MG PO TABS
20.0000 mg | ORAL_TABLET | Freq: Every day | ORAL | Status: DC
Start: 2018-08-22 — End: 2018-08-29
  Administered 2018-08-22 – 2018-08-29 (×8): 20 mg via ORAL
  Filled 2018-08-21 (×8): qty 1

## 2018-08-21 MED ORDER — ATROPINE SULFATE 0.5 MG/5ML IJ SOSY
0.50 mg | PREFILLED_SYRINGE | INTRAMUSCULAR | Status: DC
Start: ? — End: 2018-08-21

## 2018-08-21 MED ORDER — ATORVASTATIN CALCIUM 40 MG PO TABS
40.00 mg | ORAL_TABLET | ORAL | Status: DC
Start: 2018-08-22 — End: 2018-08-21

## 2018-08-21 MED ORDER — ASPIRIN 81 MG PO CHEW
81.00 mg | CHEWABLE_TABLET | ORAL | Status: DC
Start: 2018-08-22 — End: 2018-08-21

## 2018-08-21 MED ORDER — DABIGATRAN ETEXILATE MESYLATE 75 MG PO CAPS
150.00 mg | ORAL_CAPSULE | ORAL | Status: DC
Start: 2018-08-21 — End: 2018-08-21

## 2018-08-21 NOTE — Rehab Evaluation ([facility]) (Signed)
[name]  MRN: [id number]  Account: [id number]  Session Start: 08/21/2018 12:00:00 AM  Session Stop: 08/21/2018 12:00:00 AM    Rehabilitation Nursing  Inpatient Rehabilitation Admission Assessment - Functional Status and Care Plan    Rehab Diagnosis: CVA  Demographics:            Age: 62Y            Gender: Female            Date of Onset: 08/18/18            Date of Admission: 08/21/2018 6:06:19 PM  Primary Language: [place]    Past Medical History: A/Fib  HTN  HLD  DM  Migraine  Seizure  Obesity  Syncope  History of Present Illness: 62 YO F admitted with headache and left side  weakness, slurred speech, chills and blurry vision.CT Negative could not do MRI  r/t pt with loop recorder. Neurologist said most likely CVA. Patient with HX of  seizures last one 1.5 months ago. Obese, DM which is controlled and HTn that is  controlled. Patient is on aspiration precautions on a Mechanical chipped diet  with thin liquids and is working with PT and OT. Patient was I prior to  admission and is now below her baseline, would benefit from inpatient AR prior  to going home with family.  Social History:  Marital Status: D  Children: 3 children stays with daughter and SIL. Other 2 out of state          Reside:  Employment Status:  Recreational Activities/Hobbies:    Rehabilitation Precautions Restrictions:   Risk for Fall, seizure precautions    Orientation to Rehabilitation: Patient was given instructions and information on  hospital orientation. Orientation was provided for the following areas:  orientation checklist, rehab handbook, bed controls, call light, chaplain, daily  routine, meal times, phone and phone numbers, and visiting hours.  Patient/Caregiver Goals:  Patient's functional goals: To be able to walk  independently without a device or with a cane like before.    Wounds/Incisions: No wounds or incisions.    Medication Review: No clinically significant medication issues identified this  shift.    CARE  Tool  Self-Care Functional Assessment  Eating: 06 - Independent:  Patient completed the activities by him/herself with  no assistance from a helper and without use of assistive devices.  Oral Hygiene: 06 - Independent:  Patient completed the activities by him/herself  with no assistance from a helper and without use of assistive devices.  Toileting Hygiene: 04 - Supervision or touching assistance:  Helper provides  verbal cues and/or touching/steadying and/or contact guard assistance as patient  completes activity.  Toileting Equipment:   Retail banker Provided:   Stand by assistance  Assistive Device(s):   None  Shower/Bathe Self: 03 - Partial/moderate assistance:  Helper does less than half  the effort. Helper lifts, holds or supports trunk or limbs but provides less  than half the effort.  Location:  Sponge bath at bedside.  Lift/Holding Assistance Provided:   Left upper extremity, Right upper extremity  Assistive Device(s):   Grab bar/arm rest to maintain balance  Upper Body Dressing: Undressing (taking off clothing) was observed/assessed.   Supervision or touching assistance:  Helper provides verbal cues and/or  touching/steadying and/or contact guard assistance as patient completes  activity.  Assistance Provided:   Stand by assistance  Assistive Device(s):   None  Lower Body Dressing: Not observed/assessed.  Putting On/Taking Off  Footwear: Taking off footwear was observed/assessed.  Partial/moderate assistance:  Helper does less than half the effort. Helper  lifts, holds or supports trunk or limbs but provides less than half the effort.  Lift/Holding Assistance Provided:   Left lower extremity, Right lower extremity  Assistive Device(s):   None    Mobility Functional Assessment  Sit to Lying: 03 - Partial/moderate assistance:  Helper does less than half the  effort. Helper lifts, holds or supports trunk or limbs but provides less than  half the effort.  Lift/Holding Assistance Provided:    Trunk  Assistive Device(s):   None  Lying to Sitting on Side of Bed: 03 - Partial/moderate assistance:  Helper does  less than half the effort. Helper lifts, holds or supports trunk or limbs but  provides less than half the effort.  Lift/Holding Assistance Provided:   Trunk  Assistive Device(s):   None  Sit to Stand: 03 - Partial/moderate assistance:  Helper does less than half the  effort. Helper lifts, holds or supports trunk or limbs but provides less than  half the effort.  Lift/Holding Assistance Provided:   Trunk  Assistive Device(s):   None  Chair/Bed-to-Chair Transfer: Transfer to bed was observed/assessed.   Partial/moderate assistance:  Helper does less than half the effort. Helper  lifts, holds or supports trunk or limbs but provides less than half the effort.  Lift/Holding Assistance Provided:   Trunk  Assistive Device(s):   None  Toilet Transfer: 03 - Partial/moderate assistance:  Helper does less than half  the effort. Helper lifts, holds or supports trunk or limbs but provides less  than half the effort.  Lift/Holding Assistance Provided:   Trunk  Assistive Device(s):   None    Special Treatments, Procedures, and Programs: Patient did not receive total  parenteral nutrition treatment at the time of admission.    Bladder and Bowel Counts:                       Bladder (# only)             Bowel (# only)  Number of Episodes  Continent            0                            0  Incontinent          0                            0    Interdisciplinary Educational Needs and Learning Preferences:       Learning Preference: The patient's preferred learning method is:  Explanation.  The patient's preferred learning method is: Demonstration.       Barriers to Learning: No barriers.       Learning Needs: Stroke    Education Provided: No education provided this session.    ASSESSMENT and PLAN  Long Term Goals:   Time frame to achieve long term goal(s): 2 weeks from  08/21/18       1. Patient will be able to list  3 risk factors for stroke and will be able  to articulate the meaning and use of the FAST acronym for stroke identification       2. Patient will know the names, indications, and side effects of all her  medications 100 percent of the time  Short Term Goals:  Time frame to achieve short term goal(s): 1 week from  08/21/18       1. Patient will be able to articulate the meaning of the FAST acronym for  stroke identification       2. Patient will know the names and indications of her medications 75  percent of the time  Rehab Nursing-specific Problems and Interventions:  Risk of falls/injuries.  Intervention(s): Fall risk assessment using EBP tool, Use of chair and/or bed  alarms, Frequent safety check, rounding, Use of non-skid footwear    Rehab Potential: Able to participate in an intensive inpatient interdisciplinary  rehabilitation program, Good family/social support, Good premorbid functional  status, Good premorbid medical status  Barriers to Progress/Discharge: No potential barriers to progress.    TEAM CARE PLAN  Identified problems from team documentation:      Identified problems from this assessment:     Mobility : Patient will follow the falls protocol (i.e. calling for assistance  prior to attempting to transfer).    Discipline:  Nursing    Please review Integrated Patient View Care Plan Flowsheet for Team identified  Problems, Interventions, and Goals.    Signed by: Suanne Marker, RN 08/21/2018 6:54:00 PM

## 2018-08-21 NOTE — Rehab Liaison Note ([facility]) (Signed)
NAMELAURETTA [name]  MRN: [id number]  Account: [id number]  Session Start: 08/21/2018 12:00:00 AM  Session Stop: 08/21/2018 12:00:00 AM    Clinical Liaison  Inpatient Rehabilitation Pre Admission Screen    Medical Diagnosis:  L side weakness Cannot do MRI r/t Loop recorder Neurologist  said most likely L CVA  Rehab Diagnosis: CVA  Probable Impairment Group Code: 01.1 Left Body Involvement (Right Brain)  Demographics:   Age: 62Y   Gender: Female   Name, phone and relationship of contact person:  Contact Name: Maureen Chatters  Phone:   [phone]  Relationship:   Child   Guardian/Power of Attorney:   It is unknown whether the patient has a Advertising account planner.   [facility] Medical Record Number: [id number]  Past Medical History: A/Fib  HTN  HLD  DM  Migraine  Seizure  Obesity  Syncope    Prior Level of Functioning:  Mobility:   I with ambulation  Activities of Daily Living:  I with ADL's  Cognition:  WNL  Communication:  WNL  Swallowing:  WNL    CARE Tool  Prior Functioning:  Self Care: Patient completed the activities by him/herself, with or without an  assistive device, with no assistance from a helper.  Indoor Mobility: Patient completed the activities by him/herself, with or  without an assistive device, with no assistance from a helper.  Stairs: Patient completed the activities by him/herself, with or without an  assistive device, with no assistance from a helper.  Functional Cognition: Patient completed the activities by him/herself, with or  without an assistive device, with no assistance from a helper.  Prior Device Use: Patient does not use manual or motorized wheelchair or  scooter, mechanical lift, walker, or an orthotic/prosthesis.  Health Conditions: Patient has not had any falls in the past year. Patient has  not had major surgery during the 100 days prior to admission.    Social History:  Marital Status: D  Children: 3 children stays with daughter and SIL. Other 2 out of state           Reside:  Employment Status:  Recreational Activities/Hobbies:  Pre-hospital Living Environment: Lives with daughter in 3 level home with 0 STE  to enter down one flight to basement where she stays with full bath. Daughter is  stay at home mom so wil be there at discharge.  Language:  English  Hand Dominance: Right.    The following information was gathered for consideration and maintenance in the  medical record to substantiate medical necessity for an IRF level of care.    This assessment is being completed by Leonette Monarch , RN,MSN, BC . Patient is  currently at [facility] Unit # [phone]  CM Britta Mccreedy [phone].    The patient is being referred and recommended by their physician, Dyann Kief, to be assessed both medically and functionally in regard to their  premorbid functional capacity to determine whether they can benefit from a  rehabilitation level of care offered by our facility.    The following is information regarding the medical complexity and clinical risk  factors that needs to be considered for the appropriate management of the  patient's care and recovery.  Acute Medical Conditions: LUE weakness  Progressive focal motoe weakness  Vertigo  Slurred speech  Stable angina  DM  TIA  Hypokalemia  Chest Pain  Reflux  Bipolar  A/Fib  HTN  Obesity  History of Present Illness:  62 YO F admitted with headache and left side  weakness, slurred speech, chills and blurry vision.CT Negative could not do MRI  r/t pt with loop recorder. Neurologist said most likely CVA. Patient with HX of  seizures last one 1.5 months ago. Obese, DM which is controlled and HTn that is  controlled. Patient is on aspiration precautions on a Mechanical chipped diet  with thin liquids and is working with PT and OT. Patient was I prior to  admission and is now below her baseline, would benefit from inpatient AR prior  to going home with family.   Date of Onset: 08/18/18   Date Admitted to Acute:  08/18/18  Current Precautions:   Risk  for falls.   Risk of aspiration/swallowing. Mech soft/thin  Food Allergies  No known food allergies.  Medication Allergies   Penicillin: Unknown reaction.   Nitroglyercin Unknown reaction.   Tetracyclines Unknown reaction.   Rubber Unknown reaction.  Other Allergies Latex: Unknown reaction.    Present Systems Summary:  Vital signs: Maximum Temperature (last 48 hrs):  98.5 degrees.  Pulse:  79 beats per minute.  Respirations:  18 breaths per minute.  Blood Pressure:   128/78 mmHg.  Pulse Ox: 98% RA  Height/weight:  Height: 5'5"  Weight: 210ibs  Hearing: No current issues  Vision: No current issues.  Other medical comments:   None  DIET:  Current Diet Texture: Mechanical Soft diet.  Current Liquid Consistency: Thin liquids. Ground  Type: Regular.  Bladder: Patient is continent of bladder.  Bowel: Patient does not have an ostomy.  Date of Last Bowel Movement:  08/18/18 Patient is continent of bowel.  Integumentary:   Intact.  Cardiopulmonary:   Room Air.  Dialysis: Patient currently is not receiving dialysis.  Current Medication(s): 08/21/18  Asa 81mg  po QD  atorvastatin 40mg  po QD  dabigatran 150mg  BID  fluticasone furoate-vilanterol 1 puff daily  lamoTRigine 150mg  BID  omeprazole 40mg  QD  potassium chloride QD  QUEtiapine 50mg  Q PM  ropinirole 1 mg QHS   as above  Current Alcohol Use:  Patient does not consume alcohol  Current Tobacco Use:  No, patient does not use tobacco  Current Drug Use: No, patient does not use recreational drugs.  Pain: Patient currently has pain.  Location: Headaches Chronic controlled with oral pain meds  Type: Acute    Additional medical documentation contributing to the expected care of this  patient may be noted in the following areas:  Laboratory-Chemistry/Hematology: 08/18/18  WBC 6.8; RBC 3.76; HGB 9.9; HCT 31.9; PLT 319  NA 142; K 3.4; CH 105; GLU 84; BUN 13; Cr 0.9; C02 24   as above  Cultures:   Not applicable.  Radiology:   CT: Head  10/27  No CT evidence of dos[laced calvrial  fracture or acute intracranial pathology   CT: Neck 08/18/18  Normal Exam  IVs:  IV Access: No IV access.    Is patient presently participating in rehabilitation? Yes  Adjustment to Present Illness: Patient is coping adequately.   Patient is accepting limitations adequately.   Patient's expectations are realistic.   Patient is motivated. as I as possible  Activity Tolerance:  Fair.  SPECIAL NEEDS: None.    Current Functional Status  Weight-bearing Status:   No Restrictions  Mobility: 08/20/18   Supine to sit: Independent.   Ambulation: Contact guard/Minimal assistance.   Ambulation Device: RW   Ambulation Distance: 150 feet Min assit; decreased weight shift; decreased  speed  Activities  of Daily Living: 08/21/18 per nurse  Eating: Supervision/Stand by  assistance. Dressing UB: Contact guard/Minimal assistance. Dressing LB: Contact  guard/Minimal assistance.  Cognition: 08/21/18 Alert/Oriented. x4 Follows commands. No communication  deficits noted.  Communication: 08/21/18 None noted at this time.  Swallowing:  08/21/18   Mechanical ground Thin liquids  Other Impairments: None noted.    Patient is able to understand and make healthcare decisions  Yes  Payer Source: Level of care will be discussed with the following payer sources  if/when applicable.  Primary: [facility]  Secondary: Not Applicable    Information and Case Discussion:   Rehabilitation risks/benefits were reviewed.  Patient/family/caregiver agrees to/accepts rehabilitation risks/benefits.   Rehab literature/brochure was not provided at this time.  Case will be  discussed with Physician/Medical Director.        IRF Admission Approval/Non-Approval  Appropriateness for admission to the Inpatient Rehabilitation Facility:  The  patient's condition is sufficiently stable to allow active participation in an  intensive interdisciplinary inpatient rehabilitation program. The patient would  benefit from interdisciplinary inpatient rehabilitation provided by a  physician,  rehab-focused nursing, and a minimum of two rehab therapies which will provide  specialized care for the following functional deficits:   Mobility  Safety Risk  Self Care Management  Swallow Function  Comorbid conditions present at pre-admission:  The interdisciplinary team will also manage the potential risks and  complications from the following comorbid conditions:   Diabetes, Hypertension.,  Seizures.,  obesity  Recommended services:  The recommended interdisciplinary team will be comprised of the following  services:   Medical Supervision.  24 Hour Rehabilitation Nursing.  Physical Therapy.  Occupational Therapy.  Speech Therapy.  Patient's expected intensity and frequency of participation in the  interdisciplinary rehabilitation program: is 3 hours of therapy 5 days/week.  Prognosis and level of expected improvement with inpatient rehabilitation stay  is:   MI ambulation  MI ADL's  Improved swallow  Estimated date of admission to acute inpatient:   08/21/18  Estimated length of inpatient rehabilitation stay in order to achieve rehab  medical/functional goals:   5-7 days  Anticipated destination post discharge from inpatient rehabilitation is:   community discharge with assistance.    Anticipate patient will need the following services post discharge from  inpatient rehabilitation: Home health therapy. Home health nursing.    Physician Approval Status of Admission: Admission Approval:  The patient's  condition is sufficiently stable to allow active participation in an intensive  interdisciplinary inpatient rehabilitation program. 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  Co-morbidities HTN, HLD, DM, obesity, and A  fib.  She needs rehab at IRF intensity with close medical management.  RVG    This assessment denotes that on admission to the inpatient rehabilitation  facility, our physician will provide documentation that demonstrates clinical  rehabilitation complications for  which the patient is at risk and a specific  plan to avoid those risks. Further, the medical conditions present create  possible adverse conditions that predictably can be controlled through an  intensive rehabilitation plan of care to be outlined at admission.    Department of Medical Assistance Services [facility])  Intensive Rehabilitation Admission Certification    I. Certification Statement:    In accordance with 42 CFR 456.60, I certify that [name]  meets the  admission criteria for intensive rehabilitation services set forth in 12 VAC  30-60-120.    II. Criteria Determination:  (In order to meet intensive rehabilitation criteria, the  recipient must require  all the items listed below)    The rehabilitation cannot be safely and adequately carried out in a less  intensive setting; and    The interdisciplinary coordinated team approach is required; and    The recipient requires at least 2 of the the four therapies:    Physical Therapy services on a daily basis  Occupational Therapy services on a daily basis  CognitiveTherapy services on a daily basis  Speech-Language Pathology services on a daily basis    III. Physician Signature Required:    Signed by: Leonette Monarch, RN, MSN, [facility] 08/21/2018 2:43:00 PM    Physician CoSigned By: Jordan Hawks 08/21/2018 14:51:13

## 2018-08-21 NOTE — Progress Notes (Signed)
Pt c/o nausea and medicated with zofran 4mg  at 21:44 pm. Will hold 21:00 pm medications until nausea subsides. Pt also given ginger ale to further sooth GI upset. Will continue to monitor.

## 2018-08-22 ENCOUNTER — Encounter: Payer: Self-pay | Admitting: Internal Medicine

## 2018-08-22 ENCOUNTER — Inpatient Hospital Stay: Payer: Medicare Other

## 2018-08-22 DIAGNOSIS — E785 Hyperlipidemia, unspecified: Secondary | ICD-10-CM

## 2018-08-22 DIAGNOSIS — R51 Headache: Secondary | ICD-10-CM

## 2018-08-22 DIAGNOSIS — R079 Chest pain, unspecified: Secondary | ICD-10-CM

## 2018-08-22 DIAGNOSIS — I480 Paroxysmal atrial fibrillation: Secondary | ICD-10-CM

## 2018-08-22 DIAGNOSIS — R9431 Abnormal electrocardiogram [ECG] [EKG]: Secondary | ICD-10-CM

## 2018-08-22 DIAGNOSIS — R531 Weakness: Secondary | ICD-10-CM

## 2018-08-22 DIAGNOSIS — R0789 Other chest pain: Secondary | ICD-10-CM

## 2018-08-22 DIAGNOSIS — J45909 Unspecified asthma, uncomplicated: Secondary | ICD-10-CM

## 2018-08-22 DIAGNOSIS — R200 Anesthesia of skin: Secondary | ICD-10-CM

## 2018-08-22 LAB — BASIC METABOLIC PANEL
Anion Gap: 10 (ref 5.0–15.0)
BUN: 9 mg/dL (ref 7–19)
CO2: 25 mEq/L (ref 22–29)
Calcium: 9.1 mg/dL (ref 8.5–10.5)
Chloride: 105 mEq/L (ref 100–111)
Creatinine: 0.9 mg/dL (ref 0.6–1.0)
Glucose: 111 mg/dL — ABNORMAL HIGH (ref 70–100)
Potassium: 4.2 mEq/L (ref 3.5–5.1)
Sodium: 140 mEq/L (ref 136–145)

## 2018-08-22 LAB — HEPATIC FUNCTION PANEL
ALT: 9 U/L (ref 0–55)
AST (SGOT): 12 U/L (ref 5–34)
Albumin/Globulin Ratio: 1.1 (ref 0.9–2.2)
Albumin: 3.2 g/dL — ABNORMAL LOW (ref 3.5–5.0)
Alkaline Phosphatase: 75 U/L (ref 37–106)
Bilirubin Direct: 0.1 mg/dL (ref 0.0–0.5)
Bilirubin Indirect: 0.1 mg/dL — ABNORMAL LOW (ref 0.2–1.0)
Bilirubin, Total: 0.2 mg/dL (ref 0.2–1.2)
Globulin: 2.9 g/dL (ref 2.0–3.6)
Protein, Total: 6.1 g/dL (ref 6.0–8.3)

## 2018-08-22 LAB — TROPONIN I
Troponin I: <0.01 ng/mL (ref 0.00–0.05)
Troponin I: <0.01 ng/mL (ref 0.00–0.05)

## 2018-08-22 LAB — ECG 12-LEAD
Atrial Rate: 80 {beats}/min
P Axis: 46 degrees
P-R Interval: 156 ms
Q-T Interval: 438 ms
QRS Duration: 84 ms
QTC Calculation (Bezet): 505 ms
R Axis: 49 degrees
T Axis: 96 degrees
Ventricular Rate: 80 {beats}/min

## 2018-08-22 LAB — GFR: EGFR: >60

## 2018-08-22 LAB — MAGNESIUM: Magnesium: 1.9 mg/dL (ref 1.6–2.6)

## 2018-08-22 LAB — GLUCOSE WHOLE BLOOD - POCT: Whole Blood Glucose POCT: 100 mg/dL (ref 70–100)

## 2018-08-22 LAB — PHOSPHORUS: Phosphorus: 4.3 mg/dL (ref 2.3–4.7)

## 2018-08-22 MED ORDER — MORPHINE SULFATE 2 MG/ML IJ/IV SOLN (WRAP)
2.0000 mg | Freq: Once | Status: AC
Start: 2018-08-22 — End: 2018-08-22
  Administered 2018-08-22: 2 mg via INTRAVENOUS
  Filled 2018-08-22: qty 1

## 2018-08-22 MED ORDER — MORPHINE SULFATE 15 MG PO TABS
7.50 mg | ORAL_TABLET | Freq: Once | ORAL | Status: AC
Start: 2018-08-22 — End: 2018-08-22
  Administered 2018-08-22: 7.5 mg via ORAL
  Filled 2018-08-22: qty 1

## 2018-08-22 MED ORDER — DIAZEPAM 2 MG PO TABS
2.00 mg | ORAL_TABLET | Freq: Once | ORAL | Status: AC
Start: 2018-08-22 — End: 2018-08-22
  Administered 2018-08-22: 2 mg via ORAL
  Filled 2018-08-22: qty 1

## 2018-08-22 MED ORDER — TRAMADOL HCL 50 MG PO TABS
100.00 mg | ORAL_TABLET | Freq: Once | ORAL | Status: AC
Start: 2018-08-22 — End: 2018-08-22
  Administered 2018-08-22: 100 mg via ORAL
  Filled 2018-08-22: qty 2

## 2018-08-22 MED ORDER — SIMETHICONE 80 MG PO CHEW
80.00 mg | CHEWABLE_TABLET | Freq: Four times a day (QID) | ORAL | Status: DC | PRN
Start: 2018-08-22 — End: 2018-08-22

## 2018-08-22 MED ORDER — LIDOCAINE VISCOUS HCL 2 % MT SOLN
10.00 mL | Freq: Once | OROMUCOSAL | Status: AC
Start: 2018-08-22 — End: 2018-08-22
  Administered 2018-08-22: 10 mL via OROMUCOSAL
  Filled 2018-08-22: qty 10

## 2018-08-22 MED ORDER — ALUM & MAG HYDROXIDE-SIMETH 200-200-20 MG/5ML PO SUSP
30.00 mL | Freq: Once | ORAL | Status: AC
Start: 2018-08-22 — End: 2018-08-22
  Administered 2018-08-22: 30 mL via ORAL
  Filled 2018-08-22: qty 30

## 2018-08-22 MED ORDER — ALUM & MAG HYDROXIDE-SIMETH 200-200-20 MG/5ML PO SUSP
15.00 mL | Freq: Three times a day (TID) | ORAL | Status: DC | PRN
Start: 2018-08-22 — End: 2018-08-22
  Administered 2018-08-22: 15 mL via ORAL
  Filled 2018-08-22: qty 30

## 2018-08-22 MED ORDER — GADOBUTROL 1 MMOL/ML IV SOLN
10.00 mL | Freq: Once | INTRAVENOUS | Status: AC | PRN
Start: 2018-08-22 — End: 2018-08-22
  Administered 2018-08-22: 10 mL via INTRAVENOUS

## 2018-08-22 NOTE — Consults (Signed)
MEDICINE NEW CONSULT  [facility], DIVISION OF HOSPITALIST MEDICINE   [place] [facility]   [place] Pager: [phone]      Date Time: 08/22/18 2:58 PM  Patient Name: [name]  Requesting Physician: Earl Many, DO  Consulting Physician: Carley Hammed, MD    Primary Care Physician: No primary care provider on file.    Reason for Consultation: chest pain       Assessment:     Active Hospital Problems    Diagnosis   . Chest pain   . CVA (cerebral vascular accident)       62 year old female with past medical history significant for hypertension, asthma, hyperlipidemia, diet-controlled diabetes, seizure, who was presented left sided weakness after recent presumed stroke     Recommendations:   Atypical chest pain  No EKG changes  Troponin negative  Reproducible with cough and leaning back  Tramadol prn  GI cocktail  Chest xray ordered  Discussed with Dr Lucianne Muss her cardiologist, has had extensive cardiac work up, no need for further testing at this time        Left sided weakness/numbness  Presented with presumed CVA  Currently on pradaxa  MRI not done at [facility], discussed with Dr Lucianne Muss, ok to get  MRI with linq monitor          Paroxymsmal Afib  Currently in sinus  S/p linq recorder with medtronic   Confirmed with Dr Lucianne Muss office  On pradaxa   Continue metoprolol    Asthma  Not in exacerbation, mild wheeze will request nebulizer treatment  Continue inhalers      HLD  Continue statin    HTN  On metoprolol    Diet controlled diabetes    Pysch:  Continue current meds    DVT ppx: on pradaxa    Discussed with: patient, staff, Dr Timmie Foerster, Dr Gwinda Passe, Nilda Calamity, DO, thank you for this consultation.  We will follow the patient with you during this hospitalization.  Please contact me with any questions or new issues.      History of Presenting Illness:   [name] is a 62 y.o. female who to acute rehab after recent suspected CVA with left-sided weakness.  Rapid response was called  early morning due to complaints of chest discomfort.  Patient seen at bedside again this afternoon due to chest comfort, patient's pain is worse with coughing, worse with laying flat improved with sitting up.  No associated shortness of breath no diaphoresis does feel dizzy.  Complaining of left hand weakness/numbness which may be in the setting of recent stroke.  Unclear if it is different when she came in.  Denies any abdominal pain no nausea no vomiting.  No problems with bowels no problems with urination.    Past Medical History:     Past Medical History:   Diagnosis Date   . Asthma    . Cerebrovascular accident    . Diabetes mellitus     diet controlled    . Hyperlipidemia    . Hypertension        Available old records reviewed, including: EPIC      Past Surgical History:   History reviewed. No pertinent surgical history.    Family History:   History reviewed. No pertinent family history.    Social History:     Social History     Tobacco Use   Smoking Status Never Smoker   Smokeless Tobacco Never Used  Social History     Substance and Sexual Activity   Alcohol Use Never   . Frequency: Never     Social History     Substance and Sexual Activity   Drug Use Never       Allergies:     Allergies   Allergen Reactions   . Latex    . Nitroglycerin    . Penicillins    . Tetracyclines & Related        Medications:         Current Facility-Administered Medications   Medication Dose Route Frequency   . alum & mag hydroxide-simethicone  30 mL Oral Once   . atorvastatin  40 mg Oral QHS   . dabigatran  150 mg Oral Q12H SCH   . fluticasone furoate-vilanterol  1 puff Inhalation QAM   . furosemide  20 mg Oral Daily   . lamoTRIgine  150 mg Oral BID   . lidocaine viscous  10 mL Mouth/Throat Once   . metoprolol succinate XL  25 mg Oral Daily   . pantoprazole  40 mg Oral BID AC   . NON-FORMULARY PAT OWN MED order form  20 mg Oral QHS   . QUEtiapine  50 mg Oral QHS   . rOPINIRole  1 mg Oral QHS            Review of Systems:   All  other systems were reviewed and are negative     Physical Exam:     Patient Vitals for the past 24 hrs:   BP Temp Temp src Pulse Resp SpO2 Height Weight   08/22/18 1431 121/73 98.2 F (36.8 C) Oral 84 - 98 % - -   08/22/18 1146 129/83 - - 91 - 99 % - -   08/22/18 1116 115/76 - - 85 - - - -   08/22/18 0808 102/64 - - 83 - - - -   08/22/18 0448 120/76 98.2 F (36.8 C) Oral 78 18 100 % - -   08/22/18 0307 115/68 - - 87 - 100 % - -   08/22/18 0305 115/66 - - 86 18 100 % - -   08/21/18 2146 108/72 - - 99 - 98 % - -   08/21/18 1816 - - - - - - 1.651 m (5\' 5" ) 97.5 kg (215 lb)   08/21/18 1815 131/72 98.6 F (37 C) Oral 94 18 100 % - -     Body mass index is 35.78 kg/m.  No intake or output data in the 24 hours ending 08/22/18 1458    General: awake, alert,  no acute distress.  HEENT: perrla, eomi, sclera anicteric  oropharynx clear without lesions, mucous membranes moist  Neck: supple, no lymphadenopathy  Cardiovascular: regular rate and rhythm, no murmurs  Lungs: clear to auscultation bilaterally, without wheezing, rhonchi, or rales  Abdomen: soft, non-tender, non-distended; no palpable masses, no hepatosplenomegaly, normoactive bowel sounds, no rebound or guarding  Extremities: no  cyanosis or edema  Neuro: A+O x 3, cranial nerves grossly intact, strength 5/5 in upper and lower extremities, sensation intact,   Skin: no rashes or lesions noted        Labs:     Recent Labs     08/21/18  2032   WBC 6.12   Hgb 9.8*   Hematocrit 30.9*   Platelets 297       Recent Labs     08/22/18  0319   Sodium 140   Potassium 4.2  Chloride 105   CO2 25   BUN 9   Creatinine 0.9   Glucose 111*   Calcium 9.1   Magnesium 1.9   Phosphorus 4.3       Recent Labs     08/22/18  0319   AST (SGOT) 12   ALT 9   Alkaline Phosphatase 75   Protein, Total 6.1   Albumin 3.2*   Bilirubin, Total 0.2       No results for input(s): PTT, PT, INR in the last 72 hours.            Imaging personally reviewed, including: ECG,      Signed by: Carley Hammed,  MD    cc: Earl Many, DO  No primary care provider on file.

## 2018-08-22 NOTE — Rehab Progress Note ([facility]) (Signed)
[name]  MRN: [id number]  Account: [id number]  Session Start: 08/22/2018 12:00:00 AM  Session Stop: 08/22/2018 12:00:00 AM    Rehabilitation Nursing  Inpatient Rehabilitation Shift Assessment    Rehab Diagnosis: CVA  Demographics:            Age: 18Y            Gender: Female  Primary Language: English    Date of Onset:  08/18/18  Date of Admission: 08/21/2018 6:06:19 PM    Rehabilitation Precautions Restrictions:   Risk for Fall, seizure precautions, aspiration    Patient Report: I'm doing alright thanks  Patient/Caregiver Goals:  "To be able to focus and not repeat myself so much"    Wounds/Incisions: No wounds or incisions.    Medication Review: No clinically significant medication issues identified this  shift.    Bowel and Bladder Output:                       Bladder (# only)             Bowel (# only)  Number of Episodes  Continent            2                            0  Incontinent          0                            0    Care Tool  Self-Care Functional Assessment  Eating: 06 - Independent:  Patient completed the activities by him/herself with  no assistance from a helper and without use of assistive devices.  Oral Hygiene: 06 - Independent:  Patient completed the activities by him/herself  with no assistance from a helper and without use of assistive devices.  Toileting Hygiene: 06 - Independent:  Patient completed the activities by  him/herself with no assistance from a helper and without use of assistive  devices.  Toileting Equipment:   Network engineer Self: 04 - Supervision or touching assistance:  Helper provides  verbal cues and/or touching/steadying and/or contact guard assistance as patient  completes activity.  Location:  Sponge bath at sink  Assistance Provided:   Stand by assistance  Assistive Device(s):   None  Upper Body Dressing: Dressing (putting on clothing) was observed/assessed.   Setup or clean-up assistance:  Helper sets up or cleans up; patient completes  activity.  Helper assists only prior to or following the activity.  Assistance Provided:   Setup assistance  Assistive Device(s):   None  Lower Body Dressing: Dressing (putting on clothing) was observed/assessed. Setup  or clean-up assistance:  Helper sets up or cleans up; patient completes  activity. Helper assists only prior to or following the activity.  Assistance Provided:   Setup assistance  Assistive Device(s):   None  Putting On/Taking Off Footwear: Putting on footwear was observed/assessed.  Partial/moderate assistance:  Helper does less than half the effort. Helper  lifts, holds or supports trunk or limbs but provides less than half the effort.  Lift/Holding Assistance Provided:   Left lower extremity, Right lower extremity  Assistive Device(s):   None    Mobility Functional Assessment  Roll Left and Right: 06 - Independent:  Patient completed the activities by  him/herself with no assistance from a helper and without  use of assistive  devices.  Sit to Lying: 04 - Supervision or touching assistance:  Helper provides verbal  cues and/or touching/steadying and/or contact guard assistance as patient  completes activity.  Assistance Provided:   Stand by assistance  Assistive Device(s):   None  Lying to Sitting on Side of Bed: 04 - Supervision or touching assistance:  Helper provides verbal cues and/or touching/steadying and/or contact guard  assistance as patient completes activity.  Assistance Provided:   Stand by assistance  Assistive Device(s):   None  Sit to Stand: 03 - Partial/moderate assistance:  Helper does less than half the  effort. Helper lifts, holds or supports trunk or limbs but provides less than  half the effort.  Lift/Holding Assistance Provided:   Trunk  Assistive Device(s):   None  Chair/Bed-to-Chair Transfer: Transfer to bed was observed/assessed.   Partial/moderate assistance:  Helper does less than half the effort. Helper  lifts, holds or supports trunk or limbs but provides less than half the  effort.  Lift/Holding Assistance Provided:   Trunk  Assistive Device(s):   None  Toilet Transfer: 03 - Partial/moderate assistance:  Helper does less than half  the effort. Helper lifts, holds or supports trunk or limbs but provides less  than half the effort.  Lift/Holding Assistance Provided:   Trunk  Assistive Device(s):   Psychologist, sport and exercise Provided:    Education Provided: Precautions. Medication.       Audience: Patient.       Mode: Explanation.       Response: Verbalized understanding.    Long Term Goals: 1. Patient will be able to list 3 risk factors for stroke and  will be able to articulate the meaning and use of the FAST acronym for stroke  identification   2. Patient will know the names, indications, and side effects of all her  medications 100 percent of the time  2 weeks from 08/21/18  Short Term Goals: 1. Patient will be able to articulate the meaning of the FAST  acronym for stroke identification   2. Patient will know the names and indications of her medications 75 percent of  the time  1 week from 08/21/18    PROGRESS TOWARD GOALS: Medication management. Assess patient/caregiver  understanding of medication administration steps, Provide patient/caregiver with  updated medication administration record (M.A.R), Provide printed information  Response to Medication Management Interventions: we discussed the patient's  current medications and their side effects, she verbalized understanding, needs  reinforcement  Focus of Ongoing Education and Training: reinforce medication teaching until the  patient can teach back 100 percent    PLAN:  Rehab Nursing Specific Interventions:  Continue with the current Nursing Plan of Care.    TEAM CARE PLAN  Identified problems from team documentation:  Problem: Impaired Cognition  Cognition: Primary Team Goal: Patient will utilize compensatory cognitive  strategies as needed in order to complete routine personal/household management  tasks (meal planning, scheduling  appointments, etc.) with 80% accuracy provided  min A./Active    Problem: Impaired Mobility  Mobility: Primary Team Goal: Pt will be mod I for all bed mobility, transfers,  ambulation 350 feet with LRAD, and supervision for two flights of stairs with  single HR./    Problem: Impaired Self-care Mgmt/ADL/IADL  Self Care: Primary Team Goal: Pt will complete total body dressing including  item retrieval at a mod I level using LRAD/Active    Please review Integrated Patient View Care Plan Flowsheet for Team identified  Problems, Interventions, and Goals.    Signed by: Suanne Marker, RN 08/22/2018 6:42:00 PM

## 2018-08-22 NOTE — Rehab IRF Data Coll Rights Priv Act ([employer]) (Signed)
[name]  MRN: [id number]  Account: [id number]  Session Start: 08/22/2018 12:00:00 AM  Session Stop: 08/22/2018 12:00:00 AM      IRFPPS Data Collection Rights and Rehab Privacy Act Notice: Patient and  family/caregiver received a copy of the [employer] Rights and Rehab  Privacy Act Notice.    Signed by: Suella Broad, Info Spec 08/22/2018 8:00:00 AM

## 2018-08-22 NOTE — Significant Event (Signed)
HOSPITALIST RAPID RESPONSE NOTE    Date Time: 08/22/18 3:16 AM  Patient Name: [name]  Attending Physician: Earl Many, DO      Reason for Rapid Response:    Chest pain     Assessment:     Patient has history of paroxysmal Afib on anticoagulation with Pradaxa. Prior history of CVA/TIA, hypertension,HLP, diabetes, mild to moderate aortic insufficiency by TEE in 2017, COPD. She was admitted from 8/10-8/11 for chest pain in [place]. She was rulled out for MI. She has had recurrent chest pain since discharge, lasted few minutes then resolve. She saw her Cardiologist, Dr. Lucianne Muss. S/p negative nuclear stress test on 06/20/18 1) Normal myocardial perfusion scan without evidence of ischemia or scarring 2) preserved LV systolic function ejection fraction 59%. She then again Presented to [facility] on 08/18/2018 With sudden onset left-sided weakness. She has a loop recorder for 1 year placed by Dr.Ghazvini, she was told at that time that it is not MRI compatible. Head CT negative however MRI cannot be done because of having loop recorder. CTA head and neck was within normal limits in [place].        Stat EKG normal sinus rhythm, vital signs are unremarkable    1st troponin negative. BMP and electrolytes unremarkable. H/H 9.8/30.9   Chest pain resolved somewhat with Morphine 2 mg and then patient complained again few hours later   Malox given with good result when she complained about epigastric and chest pain area discomfort      Plan:    Serial troponins   Remote tele.     Case discussed with: patient, RN     Subjective:   significant events/subjective: She denies any cough, orthopnea, SOB, abdominal pain, nausea or vomiting.     Physical Exam:     Vitals:    08/22/18 0307   BP: 115/68   Pulse: 87   Resp:    Temp:    SpO2: 100%       No intake or output data in the 24 hours ending 08/22/18 0316    General: awake, alert, oriented x 3;  C/o chest pain 7/10  Cardiovascular: regular rate and rhythm, no  murmurs, rubs or gallops  Lungs: clear to auscultation bilaterally, without wheezing, rhonchi, or rales  Abdomen: soft, non-tender,normoactive bowel sounds, no rebound or guarding  Extremities: nonpitting bilateral lower extremities edema, no clubbing, cyanosis      Meds:     Current Facility-Administered Medications   Medication Dose Route Frequency   . atorvastatin  40 mg Oral QHS   . dabigatran  150 mg Oral Q12H SCH   . fluticasone furoate-vilanterol  1 puff Inhalation QAM   . furosemide  20 mg Oral Daily   . lamoTRIgine  150 mg Oral BID   . metoprolol succinate XL  25 mg Oral Daily   . morphine  2 mg Intravenous Once   . pantoprazole  40 mg Oral BID AC   . NON-FORMULARY PAT OWN MED order form  20 mg Oral QHS   . potassium chloride  10 mEq Oral Once   . QUEtiapine  50 mg Oral QHS   . rOPINIRole  1 mg Oral QHS       Labs:     EKG sinus rhythm       Recent Labs     08/21/18  2032   WBC 6.12   Hgb 9.8*   Hematocrit 30.9*   MCV 83.7       No  results for input(s): NA, K, CL, CO2, BUN, CREAT, GLU, CA, MG, PHOS in the last 72 hours.    No results for input(s): AST, ALT, ALKPHOS, PROT, ALB in the last 72 hours.    No results for input(s): PTT, PT, INR in the last 72 hours.    Signed by: Verita Schneiders, NP

## 2018-08-22 NOTE — H&P (Signed)
IRF Post-Admission Assessment  History and Physical    Date of admission: 08/21/2018    Reason for admission:  Dysfunction of mobility and ADLs due to CVA  Old medical records, laboratory and imaging test results have been reviewed    History of present illness:     This 62 y.o. year old female with history of hypertension, hyperlipidemia, asthma, DM, 2, seizures was admitted to [facility] hospital on 08/18/2018 with complaints of slurred speech, left-sided weakness .  CT of the head was unremarkable..  Patient was evaluated by neurologist for likely stroke.  She is on mechanical soft/thin liquids diet  On 08/21/2018, the patient was deemed medically stable to be discharged to acute inpatient rehabilitation.   Currently, the patient complaints of fatigue and constipation for 3-4 days    Pain assessment:  0/10   , NRS.  Patient denies any pain        Functional history:  Premorbidly, the patient was independent  with mobility and  ADLs.  At admission, the patient status is as follows:  Supine to sit: Independent.   Ambulation: Contact guard/Minimal assistance.   Ambulation Device: RW   Ambulation Distance: 150 feet Min assit; decreased weight shift; decreased  speed  Activities of Daily Living: 08/21/18 per nurse  Eating: Supervision/Stand by  assistance. Dressing UB: Contact guard/Minimal assistance. Dressing LB: Contact  guard/Minimal assistance      Past Medical History:   Past Medical History:   Diagnosis Date   . Asthma    . Cerebrovascular accident    . Diabetes mellitus     diet controlled    . Hyperlipidemia    . Hypertension      Social History:  Living arrangements: Lives with daughter in 3 level home with 0 STE  to enter down one flight to basement where she stays with full bath. Daughter is  stay at home mom so wil be there at discharge.    Family History: History reviewed. No pertinent family history.    Allergies:   Allergies   Allergen Reactions   . Latex    . Nitroglycerin    . Penicillins    .  Tetracyclines & Related        Medications:reviewed and reconciled     Current Facility-Administered Medications   Medication Dose Route Frequency   . atorvastatin  40 mg Oral QHS   . dabigatran  150 mg Oral Q12H SCH   . fluticasone furoate-vilanterol  1 puff Inhalation QAM   . furosemide  20 mg Oral Daily   . lamoTRIgine  150 mg Oral BID   . metoprolol succinate XL  25 mg Oral Daily   . pantoprazole  40 mg Oral BID AC   . NON-FORMULARY PAT OWN MED order form  20 mg Oral QHS   . QUEtiapine  50 mg Oral QHS   . rOPINIRole  1 mg Oral QHS           Review of systems:    Constitutional: Denies fevers, chills  Eyes: Denies blurred vision, decreased vision  ENMT: Denies rhinitis, pharyngitis  Skin: Denies rash, hives  Respiratory: Denies cough, SOB  Cardiovascular: Denies palpitations ,chest pain  GI: Constipation ,denies nausea, vomiting, abdominal pain, diarrhea,   GU: Denies dysuria, urinary incontinence or retention   Musculoskeletal: Difficulty with mobility.  Denies pain, tenderness, stiffness   Neurologic: Left-sided weakness denies headache, dizziness distal numbness       Physical Examination:   BP  105/67   Pulse 74   Temp 98.1 F (36.7 C) (Oral)   Resp 18   Ht 1.651 m (5\' 5" )   Wt 97.5 kg (215 lb)   SpO2 95%   BMI 35.78 kg/m     General appearance: Appears well.  In no acute distress. Obese.  Eyes:Anicteric sclerae. Conjunctivae non-injected. EOMI.  HENT:Symmetric facies. Hearing grossly intact. Moist mucous membranes.  EA:VWUJW rate and rhythm are regular. No murmurs/rubs/gallops.  Pulm:No wheezes, rales, or rhonchi. Decreased breath sounds at both bases.  JXB:JYNW. Non-tender. Normoactive bowel sounds.  Skin:No visible rashes or breakdown.  Neuro:  Mental status: AO x name and place  Language /speech: intact comprehension, fluent speech, able to name items , repeat phrases, and follow 1-step commands  II: PERRL   III,IV,VI : EOMI. No nystagmus. No ptosis  V: Sensation of the face intact bilaterally to  light touch  GNF:AOZH is symmetric and without weakness.   VIII:Hearing intact bilaterally to conversational speech.   IX,X:Palate elevates symmetrically  XI: Trapezius strength : R 5/5 L  5/5  XII: Tongue is midline  .Sensation to LT is intact  Muscle stretch reflexes :1+      Musculoskeletal:     ROM-within functional limits         Manual muscle strength testing:  Muscle group Left Right   Shoulder abductors 3 5   Elbow flexors 4 5   Elbow extensors 4 5   Finger flexors 5 5   Hip flexors 4 4   Knee flexors 4 5   Knee extensors 4 5   Ankle dorsiflexors 4 5   Ankle plantarflexors 5 5   EHL 4 5     Psych:Anxious affect  Extremities: no calf tenderness      Cognitive exam:INPATIENT REHABILITATION FACILITY - PATIENT ASSESSMENT INSTRUMENT QUALITY INDICATORS       Section C.  Cognitive Patterns.    C0100. Should Brief Interview for Mental Status (C0200-C0500) be conducted? (3-day assessment period) Attempt to conduct interview with all patients.      0. No (patient is rarely/never understood) Skip to C0900. Memory/Recall Ability   1. Yes Continue to C0200. Repetition of Three Words   Yes     Brief Interview for Mental Status (BIMS)    C0200. Repetition of Three Words      Ask patient: "I am going to say three words for you to remember. Please repeat the words after I have said all three. The words are: sock, blue and bed. Now tell me the three words."    Number of words repeated by patient after first attempt:   3. Three   2. Two   1. One   0. None     Three    After the patient's first attempt say: "I will repeat each of the three words with a cue and ask you about them later: sock, something to wear; blue, a color; bed, a piece of furniture." You may repeat the words up to two more times             Brief Interview for Mental Status (BIMS) - Continued    C0300. Temporal Orientation: Year, Month, Day      A. Ask patient: "Please tell me what year it is right now." Patient's answer is:   3. Correct   2. Missed by 1 year    1. Missed by 2 to 5 years   0. Missed by more than 5 years or no  answer     Correct       B. Ask patient: "What month are we in right now?" Patient's answer is:   2. Accurate within 5 days   1. Missed by 6 days to 1 month  0. Missed by more than 1 month or no answer     Accurate within 5 days       C. Ask patient: "What day of the week is today?" Patient's answer is:   1. Correct   0. Incorrect or no answer     Incorrect or no answer     C0400. Recall    Ask patient: "Let's go back to the first question. What were those three words that I asked you to repeat?" If unable to remember a word, give cue (i.e., something to wear; a color; a piece of furniture) for that word.     A. Recalls "sock?"   2. Yes, no cue required  1. Yes, after cueing ("something to wear")  0. No, could not recall     Yes, after cueing ("something to wear")     B. Recalls "blue?"   2. Yes, no cue required   1. Yes, after cueing ("a color")  0. No, could not recall     Yes, after cueing ("a color")     C. Recalls "bed?"  2. Yes, no cue required   1. Yes, after cueing ("a piece of furniture")   0. No, could not recall     Yes, no cue required   C0500. BIMS Summary Score.      Select "Yes", if the patient was unable to complete the interview.  Select "No", if the patient was able to complete the interview.   Yes     C0600. Should the Staff Assessment for Mental Status [phone]) be Conducted?      0. No (patient was able to complete Brief Interview for Mental Status)   1. Yes (patient was unable to complete Brief Interview for Mental Status) Continue to C0900. Memory/Recall Ability     Yes (patient was unable to complete Brief Interview for Mental Status)       Staff Assessment for Mental Status.    Do not conduct if Brief Interview for Mental Status (C0200-C0500) was completed.    C0900. Memory/Recall Ability.    Check all that the patient was normally able to recall    A. Current season    B. Location of own room    C. Staff names and faces    E.  That he or she is in a hospital/hospital unit    Z. None of the above were recalled     Current Season and That he or she is in a hospital/hospital unit         Labs:    Recent Labs   Lab 08/21/18  2032   WBC 6.12   Hgb 9.8*   Hematocrit 30.9*   Platelets 297        Recent Labs   Lab 08/22/18  0319   Sodium 140   Potassium 4.2   Chloride 105   CO2 25   BUN 9   Creatinine 0.9   Calcium 9.1   Albumin 3.2*   Protein, Total 6.1   Bilirubin, Total 0.2   Alkaline Phosphatase 75   ALT 9   AST (SGOT) 12   Glucose 111*   Magnesium 1.9   Phosphorus 4.3  No results found for: CHOL  No results found for: HDL  No results found for: LDL  No results found for: TRIG    No results found for: HGBA1C    Results     Procedure Component Value Units Date/Time    Troponin I [[id number]] Collected:  08/22/18 0647    Specimen:  Blood Updated:  08/22/18 0721     Troponin I <0.01 ng/mL     GFR [[id number]] Collected:  08/22/18 0319     Updated:  08/22/18 0444     EGFR >60.0    Basic metabolic panel [[id number]]  (Abnormal) Collected:  08/22/18 0319    Specimen:  Blood Updated:  08/22/18 0444     Glucose 111 mg/dL      BUN 9 mg/dL      Creatinine 0.9 mg/dL      Calcium 9.1 mg/dL      Sodium 284 mEq/L      Potassium 4.2 mEq/L      Chloride 105 mEq/L      CO2 25 mEq/L      Anion Gap 10.0    Hepatic function panel [[id number]]  (Abnormal) Collected:  08/22/18 0319    Specimen:  Blood Updated:  08/22/18 0444     Bilirubin, Total 0.2 mg/dL      Bilirubin, Direct 0.1 mg/dL      Bilirubin, Indirect 0.1 mg/dL      AST (SGOT) 12 U/L      ALT 9 U/L      Alkaline Phosphatase 75 U/L      Protein, Total 6.1 g/dL      Albumin 3.2 g/dL      Globulin 2.9 g/dL      Albumin/Globulin Ratio 1.1    Troponin I [[id number]] Collected:  08/22/18 0319    Specimen:  Blood Updated:  08/22/18 0401     Troponin I <0.01 ng/mL     Magnesium [[id number]] Collected:  08/22/18 0319    Specimen:  Blood Updated:  08/22/18 0355     Magnesium 1.9 mg/dL     Phosphorus  [[id number]] Collected:  08/22/18 0319    Specimen:  Blood Updated:  08/22/18 0355     Phosphorus 4.3 mg/dL     Glucose Whole Blood - POCT [[id number]] Collected:  08/22/18 0305     Updated:  08/22/18 0308     POCT - Glucose Whole blood 100 mg/dL     CBC [[id number]]  (Abnormal) Collected:  08/21/18 2032    Specimen:  Blood Updated:  08/21/18 2046     WBC 6.12 x10 3/uL      Hgb 9.8 g/dL      Hematocrit 93.2 %      Platelets 297 x10 3/uL      RBC 3.69 x10 6/uL      MCV 83.7 fL      MCH 26.6 pg      MCHC 31.7 g/dL      RDW 14 %      MPV 9.5 fL      Nucleated RBC 0.0 /100 WBC      Absolute NRBC 0.00 x10 3/uL            No results for input(s): GLUCOSEWHOLE in the last 24 hours.         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 CVA (cerebral vascular accident)   . Chest pain           [x]  Requires an intensive inpatient rehabilitation program with multidisciplinary therapies, rehab nursing, and close physician management.    [x]  The following co-morbidities may complicate rehabilitation:  Anemia, dysphasia, impaired balance        [x]  Is at risk for the following complications:  Injurious falls, Pneumonia, Urinary tract infection, Venous thromboembolic disease, Hemorrhagic conversion of stroke, Recurrent stroke, Arrhythmia and Pressure sores         Individualized  Plan of Care:    - REHAB:   Begin comprehensive and intensive inpatient rehab program, including:  Physical therapy 60-120 min daily, 5-6 times per week  Occupational therapy  60-120 min daily, 5-6 times per week  Speech therapy  60-120 min daily, 5-6 times per week  Therapeutic recreation  Psychology   Case management  Rehabilitation nursing    Psychology services for evaluation and treatment as needed.    Will work in an interdisciplinary manner to address the following impairments and issues:   Mobility, ADLs, Cognitive impairments, Dysphagia, Impaired strength, Impaired ROM, Impaired endurance, Adherence to precautions, Caregiver training, Medication management, Adjustment to disability, Impaired coordination and Impaired balance      Requires 24h rehabilitation nursing to address:  Glucose control, Hydration needs, Medication teaching, Nutrition, Positioning, Safety and Wounds    Anticipate a discharge to:  Home with assistance    Estimated length of stay: 1-2 weeks    Goals for discharge:  Bed mobility  modified independence   Transfers  modified independence   Locomotion  modified independence   Upper body dressing  modified independence   Lower body dressing  modified independence   Bathing  modified independence   Toileting  modified independence   Communication Use compensatory strategies to express needs and wants     Swallow Tolerate least restrictive oral diet without signs or symptoms of aspiration     Cognition Use compensatory strategies appropriately to compensate          Rehab potential: Good    Prognosis good    Potential limitations:Challenging home environment    Precautions: falls,    Co-morbid conditions that require management/monitoring:    Prevention of skin breakdown: Frequent repositioning    Assessment of swallowing:  Ordered    Pain Follow-up : tylenol PRN    DVT ppx: Pradaxa  Prevention of cardiovascular disease including stroke and IHD:    HTN monitoring /management: Lasix, Toprol-XL    Dyslipidemia:diet,statin    Overweight/obesity : education/sedentary lifestyle modification, nutritionist consultation        Medication Review  1. A complete drug regimen review was completed: Yes  2. Were drug issues were found during review: No        Review of Pre-admission assessment  [x]  I have  reviewed the nurse liaison's pre-admission assessment in [employer].   I do not note any significant changes at this time and agree with patient's appropriateness for intensive inpatient rehab program.      Hendricks Limes, DO      This note was generated by the Dragon speech recognition system and may contain inherent errors  or omissions not intended by the user. Grammatical errors, random word insertions, deletions and pronoun errors  are occasional consequences of this technology due to software limitations. Not all errors are caught or corrected. If there are questions or concerns about the content of this note or information contained within the body of this dictation they should be addressed directly with the author for clarification.

## 2018-08-22 NOTE — Consults (Signed)
NEUROLOGY CONSULTATION    Date Time: 08/22/18 6:28 PM  Patient Name: [name]  Attending Physician: Earl Many, DO      Assessment & Plan:   Left arm and leg numbness associated with slurred speech, some worsening since yesterday,?  Cause, could be due to new CVA  Recent admission to outside hospital with left-sided symptoms, clinically diagnosed with stroke MRI was not performed at that time  History of paroxysmal A. fib has loop monitor in place and is already on Pradaxa   Will obtain brain MRI   Based on results we will decide further work-up is required or not   We will try to review details of work-up done at outside hospital remain on Pradaxa for now       History of Present Illness:   Patient is a 62 year old lady, who was recently admitted to an outside hospital, for slurred speech and left arm and left leg numbness and weakness, difficulty walking, she was transferred here to [facility], from rehab, unfortunately they did not do an MRI, given it was thought patient had a loop recorder and was contraindicated next    Patient this afternoon developed some chest pain and worsening heaviness in the left arm hence neurology consultation was requested again  She has a prior history of a TIA about a year ago    Past Medical History:     Past Medical History:   Diagnosis Date   . Asthma    . Cerebrovascular accident    . Diabetes mellitus     diet controlled    . Hyperlipidemia    . Hypertension        Meds:   meds   Lipitor Pradaxa Lasix Lamictal Protonix Seroquel Requip tramadol    Allergies   Allergen Reactions   . Latex    . Nitroglycerin    . Penicillins    . Tetracyclines & Related        Social & Family History:     Social History     Socioeconomic History   . Marital status: Divorced     Spouse name: Not on file   . Number of children: Not on file   . Years of education: Not on file   . Highest education level: Not on file   Occupational History   . Not on file   Social Needs   .  Financial resource strain: Not on file   . Food insecurity:     Worry: Not on file     Inability: Not on file   . Transportation needs:     Medical: Not on file     Non-medical: Not on file   Tobacco Use   . Smoking status: Never Smoker   . Smokeless tobacco: Never Used   Substance and Sexual Activity   . Alcohol use: Never     Frequency: Never   . Drug use: Never   . Sexual activity: Not on file   Lifestyle   . Physical activity:     Days per week: Not on file     Minutes per session: Not on file   . Stress: Not on file   Relationships   . Social connections:     Talks on phone: Not on file     Gets together: Not on file     Attends religious service: Not on file     Active member of club or organization: Not on file  Attends meetings of clubs or organizations: Not on file     Relationship status: Not on file   . Intimate partner violence:     Fear of current or ex partner: Not on file     Emotionally abused: Not on file     Physically abused: Not on file     Forced sexual activity: Not on file   Other Topics Concern   . Not on file   Social History Narrative   . Not on file     History reviewed. No pertinent family history.        CODE STATUS:full     Review of Systems:   No headache, eye, ear nose, throat problems; no coughing or wheezing or shortness of breath, No chest pain or orthopnea, no abdominal pain, nausea or vomiting, No pain in the body or extremities, no psychiatric, neurological, endocrine, hematological or cardiac complaints except as noted above.       Physical Exam:   Blood pressure 105/67, pulse 74, temperature 98.1 F (36.7 C), temperature source Oral, resp. rate 18, height 1.651 m (5\' 5" ), weight 97.5 kg (215 lb), SpO2 95 %.    HEENT: Normocephalic.no carotid bruits  Lungs:  CTA bil  Abd Soft   Cardiac:  S1,S2, normal rate and rhythm  Neck: supple, no cartoid bruits  Extremities: no edema  Skin: no rashes seen in exposed areas     Neuro:  Level of consciousness:  Alert and  appropriate  Oriented:  X 3  Cognition:  Intact naming, recognition, concentration and following complex commands  Cranial Nerves:  II-XII intact  Strength:  No upper extremity drift, 5/5 strength x 4 extremities reduced fine finger movements in the left hand  Coordination:  Intact FTN testing  Reflexes:  +1 throughout, down going toes bil  Sensation: Reduced to light touch temperature in the left arm in the left leg  Labs:     Recent Labs   Lab 08/22/18  0319   Glucose 111*   BUN 9   Creatinine 0.9   Calcium 9.1   Sodium 140   Potassium 4.2   Chloride 105   CO2 25   Albumin 3.2*   Phosphorus 4.3   Magnesium 1.9   AST (SGOT) 12   ALT 9   Bilirubin, Total 0.2   Alkaline Phosphatase 75     Recent Labs   Lab 08/21/18  2032   WBC 6.12   Hgb 9.8*   Hematocrit 30.9*   MCV 83.7   MCH 26.6   MCHC 31.7   Platelets 297         No results for input(s): PTT, PT, INR in the last 72 hours.       Radiology Results (24 Hour)     Procedure Component Value Units Date/Time    XR Chest 2 Views [[id number]] Collected:  08/22/18 1630    Order Status:  Completed Updated:  08/22/18 1634    Narrative:       INDICATION: Atypical chest pain    COMPARISON: No prior    FINDINGS: PA and lateral views of the chest were obtained.   The heart is normal in size.   The  mediastinum and hilar structures are unremarkable.   The lung fields are clear.   There is no pleural effusion.  No pneumothorax.  The visualized osseous structures demonstrate no acute abnormality.       Impression:        No active disease  Kinnie Feil, MD   08/22/2018 4:30 PM           All recent brain and spine imaging (MRI, CT) personally reviewed.    Chart reviewed    Case discussed with: pt dr Gilles Chiquito f care        This note was generated by the Epic EMR system/Speech recognition and may contain inherent errors or omissions not intended by the user. Grammatical errors, random word insertions, deletions and pronoun errors  are occasional consequences of this technology due  to software limitations.   Not all errors are caught or corrected. If there are questions or concerns about the content of this note or information contained within the body of this dictation they should be addressed directly with the author for clarification.    Signed by: Cathe Mons, MD  Spectralink: [phone]      Answering Service: [phone]

## 2018-08-22 NOTE — Rehab Evaluation ([facility]) (Addendum)
Corrected 08/22/2018 8:16:51 AM    NAME: [name]  MRN: [id number]  Account: [id number]  Session Start: 08/22/2018 12:00:00 AM  Session Stop: 08/22/2018 12:00:00 AM    Nutrition  Inpatient Rehabilitation Initial Assessment    Rehab Diagnosis: CVA  Demographics:            Age: 26Y            Gender: Female  Primary Language: English    Past Medical History: A/Fib  HTN  HLD  DM  Migraine  Seizure  Obesity  Syncope  History of Present Illness: 62 YO F admitted with headache and left side  weakness, slurred speech, chills and blurry vision.CT Negative could not do MRI  r/t pt with loop recorder. Neurologist said most likely CVA. Patient with HX of  seizures last one 1.5 months ago. Obese, DM which is controlled and HTn that is  controlled. Patient is on aspiration precautions on a Mechanical chipped diet  with thin liquids and is working with PT and OT. Patient was I prior to  admission and is now below her baseline, would benefit from inpatient AR prior  to going home with family.            Date of Onset: 08/18/18            Date of Admission: 08/21/2018 6:06:19 PM    Medications and Allergies: Significant rehabilitation considerations:   Allergic to latex, nitroglycerin, penicillin, tetracycline and related  Rehabilitation Precautions/Restrictions:   Risk for Fall, seizure precautions    SUBJECTIVE  Patient Reports: I don't eat peas  Social History: Unknown  Patient/Caregiver Goals:  Patient's functional goals: To be able to walk  independently without a device or with a cane like before.  Pain: Patient currently without complaints of pain.    OBJECTIVE  Labs:   Glucose in BMP and Serial Glucose Monitoring: 111   Albumin: 3.2  Weight: 214.5 pounds. 97.5 kilograms.  Height:  65 inches. 1.65 meters.  BMI: 35.81 kilogram per meters squared.  Weight Group:  Obese Class II  Admission Weight: 214.5    Usual Weight: 210 pounds.  Ideal Body Weight: 125 pounds. 56.82 kilograms.  Percent Ideal Body Weight: Patient's current  weight is 172 % of ideal body  weight.  Weight Change: Patient has had no weight change since admission.    Diet Intake Prior to Admission: Mech soft  Supplements Prior to Admission: None  Herbals/Vitamins Prior to Admission:  None    Food Allergies/Intolerances: No known food allergies.  Religious/Cultural Food Practices: None  Current Medical/Nutrition Therapy: Diet:   Mechanical Soft   diet with thin liquids.    % of Meals Consumed: 50-75 %  Feeding Modality and Dentition: Oral, upper denture  Current Problems/GI Symptoms: Swallowing  Wounds/Incisions:  Absent    Estimated Nutrition and Fluids Needs:  1700-2000 calories (25-30 kcal/kg)  68-82 grams of Protein (1-1.2 grams/kg)  212-250  grams of carbohydrates (50 % total kcal)  2040 ml fluids (30 ml/kg)    ASSESSMENT  Overall Assessment of Nutritional Status:  Current Nutrition Intake: Adequate.  Current Nutrition Status: Moderately compromised.  Nutritional Risk Assessment:  Nutritional risk is present.  Nutrition Diagnosis:   NC-1.1 Swallowing Difficulty.   Related to aspiration risk as evidenced by pt tolerating mech soft diet    Interventions Provided/Recommendations:   Assessment Completed.   Monitor Progress.   Add consistent CHO cardiac rst to present diet    Barriers to Progress/Discharge: No potential  barriers to progress.      Pain Reassessment: Pain was not reassessed as no pain was reported.    Interdisciplinary Educational Needs and Learning Preferences:  Education not  assessed/provided this session.    Long Term Goals:  Time frame to achieve long term goal(s): 2 weeks       1. Pt maintains 75% or more po itake to meet calorie and protein needs  Short Term Goals:  Time frame to achieve short term goal(s): 5 days       1. Diet texture advances to regular consistency    PLAN  Recommendations for Follow-up: F/U 09/06/18    Care Plan  Identified problems from team documentation:  Problem: Impaired Mobility  Mobility: Primary Team Goal: Patient will  follow the falls protocol (i.e.  calling for assistance prior to attempting to transfer)./    Identified problems from this assessment:     No problems identified at this time.    Please review Integrated Patient View Care Plan Flowsheet for Team identified  Problems, Interventions, and Goals.    Signed by: Lurline Hare, M.S., RD 08/22/2018 8:14:00 AM

## 2018-08-22 NOTE — Rehab Evaluation ([facility]) (Addendum)
Corrected 08/22/2018 12:22:41 PM    NAME: [name]  MRN: [id number]  Account: [id number]  Session Start: 08/22/2018 8:00:00 AM  Session Stop: 08/22/2018 9:00:00 AM    Occupational Therapy  Inpatient Rehabilitation Evaluation    Rehab Diagnosis: CVA  Demographics:            Age: 62Y            Gender: Female  Primary Language: English    Past Medical History: A/Fib  HTN  HLD  DM  Migraine  Seizure  Obesity  Syncope  History of Present Illness: 62 YO F admitted with headache and left side  weakness, slurred speech, chills and blurry vision.  CT Negative could not do  MRI r/t pt with loop recorder. Neurologist said most likely CVA. Patient with HX  of seizures last one 1.5 months ago. Obese, DM which is controlled and HTN that  is controlled. Patient is on aspiration precautions on a Mechanical chipped diet  with thin liquids and is working with PT and OT. Patient was I prior to  admission and is now below her baseline, would benefit from inpatient AR prior  to going home with family.   Date of Onset: 08/18/18   Date of Admission: 08/21/2018 6:06:19 PM  Premorbid Functional Level: Mod I with mobility with occasional use of SPC, I  with ADLs and occasional assist for IADLs  Social/Educational History: Enjoys walking and reading. Has 3 children with only  1 locally  Home Environment: Lives with her daughter and son in law in a 3 level town home  with 0 STE. Her bedroom is on the basement level with a full bath and a full  flight of steps to access. Has a shower stall with a shower seat and standard  toilets.  Reports daughter able to assist as needed upon home d/c    Medications and Allergies: Significant rehabilitation considerations:   Allergic to latex, nitroglycerin, penicillin, tetracycline and related  Rehabilitation Precautions/Restrictions:   Risk for Fall, Seizure precautions, HTN, DM    SUBJECTIVE  Patient Report: "I feel like I am not as fast as usual"  Patient/Caregiver Goals:  Patient's functional goals:  To be able to walk  independently without a device or with a cane like before.  Pain: Patient currently without complaints of pain.  Pain Reassessment: Pain was not reassessed as no pain was reported.    OBJECTIVE  General Observation: OT orders received and chart reviewed. Per conversation  with RN, pt appropriate for engagement in initial OT eval. Rapid response  performed last night d/t reported pt chest pain which has resolved.  Received semi supine in bed, alert and agreeable. + telemetry and + peripheral  IV acces on LUE  Understanding of Current Condition: Pt has fair understanding of current  medical/functional status and would benefit from interdisciplinary stroke  education  Vital Signs:                       Before Activity              After Activity  Vitals  Time                 814                          -  Position/Activity    sitting                      -  BP Systolic          100                          -  BP Diastolic         64                           -  Pulse                83                           -    Cognition: Able to follow multi step commands and express herself without  difficulty. Able to problem solve, plan and sequence through rote ADL routine.  No impulsivity noted. See SLP for full details on cognition  Vision:  Denies visual changes; +reading glasses; able to track moving target  t/o all quadrants with smooth and fluid movements  Perception: WNL    Upper Extremity Status   Tone: WNL t/o BUEs   Range of Motion: WFL t/o BUEs   Fine Motor: Able to perform opposition bilaterally; noted decreased speed on L  hand  9 hole peg test:  R hand: 31 seconds  L hand: 35 seconds  Grip strength via Dynamometer:  R hand: 35.4  L hand: 12.9   Other: LUE functioning as at a functional assist level via FUEL scale    Strength/Motor Control: Demonstrates mild LUE weakness t//o all major mm groups,  grossly 4/5 MMT as she is able to take on some resistance before breaking.  Performs SPTs with  light guarding and use of external supports. Fair dynamic  standing balance requiring CG especially when BUE support is not available  Sensation: Reports decreased sensation along LUE    Interventions:  Moderate Complexity Evaluation: An occupational profile and medical and therapy  history, which includes an expanded review of medical and/or therapy records and  additional review of physical, cognitive, or psychosocial history related to  current functional performance  An assessment(s) that identifies 3-5 performance deficits (i.e., relating to  physical, cognitive, or psychosocial skills) that result in activity limitations  and/or participation restrictions  Patient may present with comorbidities that affect occupational performance.  Minimal to moderate modification of tasks or assistance (i.e, physical or  verbal) with assessment(s) is necessary to enable patient to complete evaluation  component  Clinical decision-making of moderate analytic complexity, which includes an  analysis of the occupational profile, analysis of data from detailed  assessment(s), and consideration of several treatment options No treatment  provided today.  Patient was left seated in chair in his/her room. Handoff to nurse completed.  Chair alarm in place and activated.  Oriented to call bell and placed within reach.  Personal items within reach.  Assistive devices positioned out of reach.    Interdisciplinary Educational Needs and Learning Preferences:       Learning Preference: The patient's preferred learning method is:  Explanation.  The patient's preferred learning method is: Demonstration.       Barriers to Learning: No barriers.       Learning Needs: Communication/cognition, Equipment, Functional  activities/mobility, Leisure/vocational, Medical management, Pain management,  Plan of care, Precautions, Rehabilitation techniques and procedures, Safety,  Stroke    Education Provided: Plan of care. Rehab techniques and procedures.  role of OT  Activities of daily  living. Bed mobility. Functional transfers. Home  exercise/activity plan.       Audience: Patient.       Mode: Explanation.  Demonstration.       Response: Verbalized understanding.  Needs reinforcement.    ASSESSMENT  Summary of Deficits and Prognosis: [name] is a 62 year old female admitted to  [facility] inpatient rehab on 10/29 s/p suspected CVA with L sided weakness. MRI  unable to clarify if CVA occured given presence of a loop recorder. Her past  medical hx is significant for HTN, HLD, DM, seizures, and sedentary lifestyle.  Prior to suspected CVA, pt was mod I for functional mobility using SPC, I with  ADLs and occasionally performing IADLs.  Currently, she presents with mild LUE weakness and incoordination, decreased  standing tolerance, decreased standing balance and poor knowledge of CVA  prevention impacting her safety and independence with daily tasks. She would  benefit from a short IPR stay including skilled OT services within an  interdisciplinary approach to further address above deficits and return to PLOF  Rehab Potential: Able to participate in an intensive inpatient interdisciplinary  rehabilitation program, Good family/social support, Living in the community  premorbidly, Motivated  Barriers to Progress/Discharge: Architectural barriers in home    Short Term Goals:  Not applicable.  Long Term Goals:   Time frame to achieve long term goal(s):   1 week from date of initial OT eval       1. Pt will complete total body dressing including item retrieval at a mod I  level using LRAD as needed       2. Pt will navigate short household distances at a mod I level using LRAD  in order to access her bathroom/kitchen       3. Pt will demonstrate improved LUE strength and coordination progressing  from functional assist to fully functional assist on the FUEL scale       4. Pt will demonstrate improved standing balance/tolerance by completing an  IADL of her choice in standing for  10 minutes with SBA       5. Pt will attend stroke education class and be able to independently  identify 3 controllable risk factors in order to decrease future CVAs    CARE Tool  Self-Care Functional Assessment  Eating: 06 - Independent:  Patient completed the activities by him/herself with  no assistance from a helper and without use of assistive devices.  Oral Hygiene: 05 - Setup or clean-up assistance:  Helper sets up or cleans up;  patient completes activity. Helper assists only prior to or following the  activity.  Assistance Provided:   Setup assistance  Assistive Device(s):   None  Toileting Hygiene: 04 - Supervision or touching assistance:  Helper provides  verbal cues and/or touching/steadying and/or contact guard assistance as patient  completes activity.  Toileting Equipment:   Retail banker Provided:   Stand by assistance  Assistive Device(s):   None  Shower/Bathe Self: 04 - Supervision or touching assistance:  Helper provides  verbal cues and/or touching/steadying and/or contact guard assistance as patient  completes activity.  Location:  Sponge bath at sink  Assistance Provided:   Stand by assistance  Assistive Device(s):   None  Upper Body Dressing: Dressing (putting on clothing) was observed/assessed.   Setup or clean-up assistance:  Helper sets up or cleans up; patient completes  activity. Helper assists only prior to or following the activity.  Assistance Provided:   Setup assistance  Assistive Device(s):   None  Lower Body Dressing: Dressing and undressing were observed/assessed. 04 -  Supervision or touching assistance:  Helper provides verbal cues and/or  touching/steadying and/or contact guard assistance as patient completes  activity.  Assistance Provided:   Stand by assistance  Assistive Device(s):   None  Putting On/Taking Off Footwear: Putting on footwear was observed/assessed. Setup  or clean-up assistance:  Helper sets up or cleans up; patient completes  activity. Helper assists  only prior to or following the activity.  Assistance Provided:   Setup assistance  Assistive Device(s):   None    Mobility Functional Assessment  Sit to Lying: 04 - Supervision or touching assistance:  Helper provides verbal  cues and/or touching/steadying and/or contact guard assistance as patient  completes activity.  Assistance Provided:   Stand by assistance  Assistive Device(s):   Bed rail  Lying to Sitting on Side of Bed: Not observed.  Sit to Stand: 04 - Supervision or touching assistance:  Helper provides verbal  cues and/or touching/steadying and/or contact guard assistance as patient  completes activity.  Assistance Provided:   Stand by assistance  Assistive Device(s):   None  Chair/Bed-to-Chair Transfer: Transfer to bed was observed/assessed.   Supervision or touching assistance:  Helper provides verbal cues and/or  touching/steadying and/or contact guard assistance as patient completes  activity.  Assistance Provided:   Stand by assistance  Assistive Device(s):   Bed rails  Toilet Transfer: 04 - Supervision or touching assistance:  Helper provides  verbal cues and/or touching/steadying and/or contact guard assistance as patient  completes activity.  Assistance Provided:   Stand by assistance, Contact guard  Assistive Device(s):   Grab bars    Self Care Discharge Goals:   Lower Body Dressing: Patient completed the activities by him/herself with no  assistance from a helper.  Hearing, Speech, and Vision: Expression of Ideas and Wants: Expresses complex  messages without difficulty and with speech that is clear and easy to  understand.  Understanding Verbal and Non-Verbal Content: Understands: Clear comprehension  without cues or repetitions.    Risks/Benefits of Rehabilitation Discussed with Patient/Caregiver: Yes.  Recommendations/Goals for Rehabilitation Discussed with Patient/Caregiver: Yes.    PLAN  Occupational Therapy Plan: Occupational Therapy is recommended.  Recommended  Frequency/Duration/Intensity: 60-120 minutes per day; 5-6x a week;  one on one and group therapy as appropriate  Activities Contributing Toward Care Plan: NMRE, ADL retraining, transfer  training, therapeutic activities, therapeutic exercise, DME assessment, IADL  retraining, stroke education and d/c planning  The patient is not appropriate for group treatment.    Team Care Plan  Please review Integrated Patient View Care Plan Flowsheet for Team identified  Problems, Interventions, and Goals.    Identified problems from team documentation:  Problem: Impaired Mobility  Mobility: Primary Team Goal: Patient will follow the falls protocol (i.e.  calling for assistance prior to attempting to transfer)./    Identified problems from this assessment:     Self Care Management : Pt will complete total body dressing including item  retrieval at a mod I level using LRAD    Discipline:  Occupational Therapy    3 Hour Rule Minutes: 60 minutes of OT treatment this session count towards  intensity and duration of therapy requirement. Patient was seen for the full  scheduled time of OT treatment this session.  Therapy Mode Minutes: Individual: 60 minutes.    Signed by: Merrilee Jansky, MOTS, OTR/L 08/22/2018 9:00:00 AM

## 2018-08-22 NOTE — Rehab Evaluation ([facility]) (Signed)
[name]  MRN: [id number]  Account: [id number]  Session Start: 08/22/2018 12:00:00 AM  Session Stop: 08/22/2018 12:00:00 AM    Case Management  Inpatient Rehabilitation Initial Assessment    Rehab Diagnosis: CVA  Demographics:            Age: 62Y            Gender: Female    Past Medical History: A/Fib  HTN  HLD  DM  Migraine  Seizure  Obesity  Syncope  History of Present Illness: 62 YO F admitted with headache and left side  weakness, slurred speech, chills and blurry vision.CT Negative could not do MRI  r/t pt with loop recorder. Neurologist said most likely CVA. Patient with HX of  seizures last one 1.5 months ago. Obese, DM which is controlled and HTn that is  controlled. Patient is on aspiration precautions on a Mechanical chopped diet  with thin liquids and is working with PT and OT. Patient was I prior to  admission and is now below her baseline, would benefit from inpatient AR prior  to going home with family.   Date of Onset: 08/18/18   Date of Admission: 08/21/2018 6:06:19 PM    Premorbid Functional Level: Patient reported Mod I with mobility with occasional  use of SPC, I with ADLs and occasional assist for IADLs  Understanding of Current Condition: Patient has a good understanding of her  current functional status.  Patient/Caregiver Goals:  Patient's functional goals: "To be able to focus and  not repeat myself so much"  Home Environment: Patient lives in a home environment. Patient lives with Morrell Riddle,  daughter who is (are) able to assist patient at discharge. Pre-hospital Living  Environment: Lives with daughter in 3 level home with 0 STE  to enter down one flight to basement where she stays with full bath.  Primary Language: English    Demographics: (Source of HX, Pre-Hosp, Marital, Income, Vocation, [place])  Source of History: Patient.  Pre-Hospital Living  Environment: Home.  Marital Status: Divorced.  Income Source:   Hydrologist Income (SSDI).    Vocational Status: Retired for  Disability.  Family Contact Information:  Primary Contact: Maureen Chatters  Relationship: daughter  Address: [phone] [place] [address]  Primary Number: [phone]  Secondary Number:    Do you request that your family memeber or chosen representative be notified of  your admission to the hospital? Paitent responded with no as her family already  knows.    POA/Guardian Information:  Patient does not have a guardian.  Care Act Designee: Maureen Chatters  Address: [address] [place] [address]  Phone number: [phone]  Military Status: The patient has never been in the [employer].  Financial Concerns: none reported    Special Needs: Rehabilitation Precautions/Restrictions:   Risk for Fall, Seizure precautions, HTN, DM  Observed Behaviors:  Cooperative.  Psychosocial History:  Adjustment to Present Illness:  Patient is coping adequately.   Patient is accepting limitations adequately.   Patient's expectations are realistic.   Patient is motivated. Daughter ask appropriate questions appears to be coping  well  Patient Perceived Primary Stressors:  Providers: 1) [name] (Primary Care) [phone] 2) Dr. Mosetta Pigeon,  Neurology    Do you request that your Primary Care Physician be notified of your admission to  the hospital? Patient reponded with yes; PCP office notified via phone    Home Care/Long Term Care Policy: None.    Interdisciplinary Educational Needs and Learning  Preferences:  Education not  assessed/provided this session.    Rehab Potential: Able to participate in an intensive inpatient interdisciplinary  rehabilitation program, Good family/social support, Good premorbid functional  status, Good premorbid medical status, Living in the community premorbidly,  Motivated  Barriers to Progress/Discharge: No potential barriers to progress.    Case Management Psychosocial Assessment:    This SW completed initial assessment with patient; introduced self and explained  CM role. SW verified address and  contact information. She will return home with  her daughter Morrell Riddle [phone].    Current DME in place 1) cane 2) shower chair    No current home health services    SW discussed team conference, home health, DME and updated white binder.    No discharge barriers identified.    Family Meeting: Family meeting was offered and has been scheduled. Meeting date  and time: Tuesday, August 28, 2018 at [employer] Resources: TBD    Medicare Important Message: The Medicare Important Message letter was issued.    Care Plan  Identified problems from team documentation:  Problem: Impaired Cognition  Cognition: Primary Team Goal: Patient will utilize compensatory cognitive  strategies as needed in order to complete routine personal/household management  tasks (meal planning, scheduling appointments, etc.) with 80% accuracy provided  min A./Active    Problem: Impaired Mobility  Mobility: Primary Team Goal: Pt will be mod I for all bed mobility, transfers,  ambulation 350 feet with LRAD, and supervision for two flights of stairs with  single HR./    Problem: Impaired Self-care Mgmt/ADL/IADL  Self Care: Primary Team Goal: Pt will complete total body dressing including  item retrieval at a mod I level using LRAD/Active    Identified problems from this assessment:     No problems identified at this time.    Please review Integrated Patient View Care Plan Flowsheet for Team identified  Problems, Interventions, and Goals.    Signed by: Kellie Shropshire, MSW 08/22/2018 4:42:00 PM

## 2018-08-22 NOTE — Rehab Evaluation ([facility]) (Addendum)
Corrected 08/22/2018 12:32:58 PM    NAME: [name]  MRN: [id number]  Account: [id number]  Session Start: 08/22/2018 9:00:00 AM  Session Stop: 08/22/2018 10:00:00 AM    Speech Language Pathology  Inpatient Rehabilitation Language Cognitive-Dysphagia Evaluation    Rehab Diagnosis: CVA  Demographics:            Age: 62Y            Gender: Female  Primary Language: English    Past Medical History: A/Fib  HTN  HLD  DM  Migraine  Seizure  Obesity  Syncope  History of Present Illness: 62 YO F admitted with headache and left side  weakness, slurred speech, chills and blurry vision.CT Negative could not do MRI  r/t pt with loop recorder. Neurologist said most likely CVA. Patient with HX of  seizures last one 1.5 months ago. Obese, DM which is controlled and HTn that is  controlled. Patient is on aspiration precautions on a Mechanical chopped diet  with thin liquids and is working with PT and OT. Patient was I prior to  admission and is now below her baseline, would benefit from inpatient AR prior  to going home with family.   Date of Onset: 08/18/18   Date of Admission: 08/21/2018 6:06:19 PM  Premorbid Functional Level: Mobility:   I with ambulation  Activities of Daily Living:  I with ADL's  Cognition:  WNL  Communication:  WNL  Swallowing:  WNL  Social/Educational History: Marital Status: D  Children: 3 children stays with daughter and SIL. Other 2 out of state    Employment Status: patient reports that she is on disability  Recreational Activities/Hobbies: walks, cooking, and reading  Home Environment: Lives with daughter in 3 level home with 0 STE to enter down  one flight to basement where she stays with full bath. Daughter is stay at home  mom so wil be there at discharge.    Medications and Allergies: Significant rehabilitation considerations:   please see Epic  Rehabilitation Precautions/Restrictions:   Risk for Fall, seizure precautions, aspiration    SUBJECTIVE  Patient/Caregiver Goals:  Patient's functional  goals: "To be able to focus and  not repeat myself so much"  Understanding of Current Condition: Patient has a good understanding of her  current functional status.  Pain: Patient currently without complaints of pain.  Pain Reassessment: Pain was not reassessed as no pain was reported.    OBJECTIVE  Vision and Hearing: Hearing is [facility]. Patient wears glasses for reading.    Oral Motor Exam: Adequate strength and ROM of oral structures for speech and  swallowing. Mildly decreased L labial retraction. Patient wears upper dentures.  She is missing her lower R molars.  Swallow:   Clinical Assessment: Patient consumed thin liquids via single and consecutive  cup sips with no s/s asp/pen. She passed a 3oz water test with no s/s asp/pen.  She demonstrated slowed, but adequate mastication of a soft solid (cracker).  Complete oral clearance and no s/s asp/pen. Patient reports that mastication is  "effortful." Recommend trialing patient with regular textures for diet upgrade.   Recommended Diet: Mechanical Soft diet. Thin liquids.   Swallow Precautions: Upright position, Small bite/sip    Language/Cognitive Tests Administered:  Cognitive Linguistic Quick Test (CLQT),  Hopkins Verbal Learning Test Revised (HLVT) Form 5, and the Understanding  Medicine Labels of the Assessment of Language-Related Functional Activities  (ALFA).      ALFA Understanding Medicine Labels: 9/10, score indicates a high probability  of  independent functioning on task    Cognitive Linguistic Quick Test:    Personal Facts Raw Score: 8 /8  Personal Facts Criterion Cut Score: 8    Symbol Cancellation Raw Score: 2 /12  Symbol Cancellation Criterion Cut Score: 11    Confrontation Naming Raw Score: 10 /10  Confrontation Naming Raw Score: 10    Clock Drawing Raw Score: 10 /13  Clock Drawing Criterion Cut Score: 12    Story Retelling Raw Score: 4 /10  Story Retelling Criterion Cut Score: 6    Symbol Trails Raw Score: 5 /10  Symbol Trails Criterion Cut Score:  9    Generative Naming Raw Score: 4 /9  Generative Naming Criterion Cut Score: 5    Design Memory Raw Score: 2 /6  Design Memory Criterion Cut Score: 5    Mazes Raw Score: 5 /8  Mazes Criterion Cut Score: 7    Design Generation Raw Score: 3 /13  Design Generation Criterion Cut Score: 6      Attention Cognitive Domain Score: 72  Attention Cognitive Domain Impairment: Moderate    Memory Cognitive Domain Score: 104  Memory Cognitive Domain Impairment: Severe    Nurse, mental health Domain Score: 17  Executive Functions Cognitive Domain Impairment: Moderate    Language Cognitive Domain Score: 26  Language Cognitive Domain Impairment: Mild    Visuospatial Skills Cognitive Domain Score: 40  Visuospatial Skills Cognitive Domain Impairment: Severe    Composite Severity Rating Cognitive Domain Score: 1.8  Composite Severity Rating Cognitive Domain Impairment: Moderate    Hopkins Verbal Learning Test:  Total Recall Raw Score: 14  Total Recall T Score: <20 (profound)  Delayed Recall Raw Score: 3  Delayed Recall T Score: <20 (profound)  Retention Raw Score: 60%  Retention T Score: 27 (moderate)  Recognition Discrimination Index: 8  Recognition Discrimination Index T Score: 25 (severe)    Communication:   Motor Speech: Patient reports that her speech has returned to baseline. Rate of  speech is slow. Intermittent periods of decreased articulatory precision, though  100% intelligibility is maintained at discourse level.   Auditory Comprehension:   Negatively impacted by processing. Patient required min repetitions of novel  instructions and clarifying questions to ensure comprehension/processing of  information.   Reading Comprehension: Patient read medicine labels and answered questions with  90% accuracy. Recommend ongoing assessment with more complex information.   Verbal Expression:  WFL. Language is grammatical. No evidence of anomia.   Pragmatic Language:  WFL. Appropriate initiation and participation  in  conversational exchanges.   Written Expression:  DNT.    Cognition:   Attention:       Patient demonstrated adequate sustained attention to tasks.  She demonstrated decreased attention to detail and alternating attention. She  scored in the moderate range on the CLQT.   Processing: Mild impairments in speed and quality of information processing.  She benefits from repetition and rephrasing of novel information.   Behavior: Pleasant and cooperative. She was quick to abandon tasks when they  became challenging.   Memory:       Patient is oriented to time and place. She reported TIA vs. CVA.  She reports concerns with memory, stating that she is forgetting entire  conversations with her daughter following hours or days. She scored in the  severe range for immediate and delayed memory on the HLVT and CLQT.   Problem Solving/Reasoning: She demonstrated some breakdowns in solving novel  tasks due to executive functioning and attention  deficits.   Awareness/Judgment: Patient is aware of her current impairments, verbalizing  concerns with mobility and memory.   Executive Functioning: Patient scored in the moderate range on the CLQT. She  demonstrated breakdowns in planning. She demonstrated good self-monitoring of  her performance and could generally identify, but not correct mistakes. Marland Kitchen    CARE Tool  Hearing, Speech, Vision: Expression of Ideas and Wants: Expresses complex  messages without difficulty and with speech that is clear and easy to  understand.  Understanding Verbal and Non-Verbal Content: Usually Understands: Understands  most conversations, but misses some part/intent of message. Requires cues at  times to understand.    Interventions: None provided today.  Patient was left seated in chair in his/her room. Handoff to nurse completed.  Chair alarm in place and activated.  Oriented to call bell and placed within reach.  Personal items within reach.  Assistive devices positioned out of  reach.  Interdisciplinary Educational Needs and Learning Preferences:       Learning Preference: The patient's preferred learning method is:  Explanation.  The patient's preferred learning method is: Demonstration.  The patient's preferred learning method is: Video.  The patient's preferred learning method is: Programme researcher, broadcasting/film/video.       Barriers to Learning: Cognitive limitations.       Learning Needs: Communication/cognition, Stroke, Swallowing    Education Provided: Plan of care. Cognitive functioning. Diet texture  recommendations.       Audience: Patient.       Mode: Explanation.       Response: Verbalized understanding.    ASSESSMENT  Patient presents with overall moderate cognitive linguistic-impairments. She  scored within the severe range on memory testing, though processing impairments  and reduced frustration tolerance, likely impacted performance. Patient reported  frustration with inability to recall conversations with her daughter and care  team members. She would benefit from training in compensatory strategies/aids  during her inpatient rehabilitation stay to support processing, attention,  memory, and executive functions. Anticipate that patient may require assistance  or supervision with IADLs upon d/c home.    With regards to her swallow function, patient presents with mild oral phase  dysphagia, primarily in effort required for mastication of solids. Anticipate  that she will return to a regular diet prior to d/c home.  Rehab Potential: Able to participate in an intensive inpatient interdisciplinary  rehabilitation program, Living in the community premorbidly, Motivated  Barriers to Progress/Discharge: No potential barriers to progress.    Short Term Goals: Not applicable.  Long Term Goals:  Time frame to achieve long term goal(s):  7-10 days       1. Patient will take effective notes, recording pertinent details from  information provided verbally, in 80% of opportunities provided min A in  order  to support recall of important conversations, education, etc.       2. Patient will utilize compensatory cognitive strategies as needed in  order to complete routine personal/household management tasks (meal planning,  scheduling appointments, etc.) with 80% accuracy provided min A.    Risks/Benefits of Rehabilitation Discussed with Patient/Caregiver: Yes.  Recommendations/Goals for Rehabilitation Discussed with Patient/Caregiver: Yes.    PLAN  Speech Pathology Plan: Speech Language Pathology is recommended to address:  Recommended Frequency/Duration/Intensity: 60-120 minutes a day, 5-6 days/week,  x7-10 days  Activities Contributing Toward Care Plan: cognitive retraining, compensatory  cognitive strategy training, family education/training, dysphagia management,  collaborative d/c planning      TEAM CARE PLAN  Please review Integrated  Patient View Care Plan Flowsheet for Team identified  Problems, Interventions, and Goals.    Identified problems from team documentation:  Problem: Impaired Mobility  Mobility: Primary Team Goal: Patient will follow the falls protocol (i.e.  calling for assistance prior to attempting to transfer)./    Identified problems from this assessment:     Cognition : Patient will utilize compensatory cognitive strategies as needed in  order to complete routine personal/household management tasks (meal planning,  scheduling appointments, etc.) with 80% accuracy provided min A.    Discipline:  Speech/Language Pathology    3 Hour Rule Minutes: 60 minutes of SLP treatment this session count towards  intensity and duration of therapy requirement. Patient was seen for the full  scheduled time of SLP treatment this session.  Therapy Mode Minutes: Individual: 60 minutes.    Signed by: Marcha Solders, M.S., CCC-SLP 08/22/2018 10:00:00 AM

## 2018-08-22 NOTE — Progress Notes (Signed)
Pt had 2 episodes of chest pain. Morphine 2mg  given at 04:05 am IVP through [id number] per ICU RN. Pt states pain was a 7/10 but subsided to about a 4/10. Approx an hour later pt c/o chest "pain and pressure" At approx 04:55 am pt states she feels as if the pain/pressure is increasing again to about a 6/10. Cardiac Markers are within normal limits. Other labs were unremarkable with exception of H/H slightly low 9.8/30.9.  Pt received protonix at 04:57 and Maalox at 05:42 am. Pt states pain/pressure has decreased to 4/10.

## 2018-08-22 NOTE — Rehab Evaluation ([facility]) (Signed)
NAMEMARISOL [name]  MRN: [id number]  Account: [id number]  Session Start: 08/22/2018 11:00:00 AM  Session Stop: 08/22/2018 12:00:00 PM    Physical Therapy  Inpatient Rehabilitation Eval    Rehab Diagnosis: CVA  Demographics:            Age: 62Y            Gender: Female  Primary Language: [place]  Past Medical History: A/Fib  HTN  HLD  DM  Migraine  Seizure  Obesity  Syncope  History of Present Illness: 62 YO F admitted with headache and left side  weakness, slurred speech, chills and blurry vision.CT Negative could not do MRI  r/t pt with loop recorder. Neurologist said most likely CVA. Patient with HX of  seizures last one 1.5 months ago. Obese, DM which is controlled and HTn that is  controlled. Patient is on aspiration precautions on a Mechanical chopped diet  with thin liquids and is working with PT and OT. Patient was I prior to  admission and is now below her baseline, would benefit from inpatient AR prior  to going home with family.   Date of Onset: 08/18/18   Date of Admission: 08/21/2018 6:06:19 PM  Premorbid Functional Level: Mod I with mobility with occasional use of SPC, I  with ADLs and occasional assist for IADLs. Enjoyed walking outdoors for  activity.  Social/Education History: Marital Status: [name]  Children: 3 children stays with daughter and SIL. Other 2 out of state      Employment Status: patient reports that she is on disability    Recreational Activities/Hobbies: walks, cooking, and reading  Home Environment: Lives with daughter in 3 level home with 0 STE to enter. Down  one flight to basement with single HR where she stays with full bath. Daughter  is stay at home mom so wil be there at discharge. Pt currently owns [facility] and  shower chair.    Medications and Allergies: Significant rehabilitation considerations:   Please see Epic  Rehabilitation Precautions/Restrictions:   Risk for Fall, seizure precautions, aspiration    SUBJECTIVE  Patient/Caregiver Goals:  Patient'[name] functional goals: "To gain as  much of my  independence back as I can"  Pain: Patient currently without complaints of pain.  Pain Reassessment: Pain was not reassessed as no pain was reported.    OBJECTIVE  General Observations: Orders received, chart reviewed, pt cleared for therapy by  RN. Pt has glasses for reading, not worn during evaluation. Noted increased  edema of L LE. Supervising therapist was present in the room at all times,  directing and guiding the treatment and plan of care. Pt currently on telemetry.  Understanding of Current Condition: Patient has a good understanding of her  current functional status. Will benefit from stroke education class.  Vital Signs:                       Before Activity              After Activity  Vitals  Position/Activity    Seated                       Seated  BP Systolic          115                          129  BP Diastolic  76                           83  Pulse                85                           92    Communication/Cognition: Pt alert and pleasant throughout session. Able to  follow simple and complex commands. Pt is aware that L LE is weaker than R and  did not demonstrate impulsivity throughout session. Attempted to grab car door  when completing car transfer, directed pt to grab onto solid and nonmoving  surface instead, pt able to comply and understood.  Sensation: L LE diminished to light touch, more impaired distally. Pt able to  correctly identify location of light touch 5/5 trials.  Range of Motion: ROM WFL  Strength/Motor Control: R hip FLX: 4+/5  R knee EXT: 5/5  R knee FLX: 4+/5  R ankle DF: 5/5  R ankle PF: 5/5  L hip FLEX: 4/5  L knee EXT: 4/5  L knee FLX: 3+/5  L ankle DF: 4/5  L ankle PF: 4/5  Balance: Sitting balance normal, able to maintain with hands in lap and no UE  support. Able to stand and sit with minimal use of hands and stand unsupported  with minimal sway for 2 minutes. Please see [name] assessment for more detail.  Berg Balance Scale:  1. Sitting to  Standing: 4 - able to stand without using hands and stabilize  independently  2. Arises: 4 - able to stand safely 2 minutes  3. Sitting Unsupported: 4- did not perform based on ability to stand 2 minutes  unsupported.  4. Standing to Sitting: 3 - controls descent by using hands  5. Transfers: 3 - able to transfer safely definite need of hands  6. Standing Unsupported with Eyes Closed: 3 - able to stand 10 seconds with  supervision  7. Standing with Feet Together: 4 - able to place feet together independently  and stand 1 minute safely  8. Reaching Forward in Standing: 3 - can reach forward > 12.5cm/5in safely  9. Picking up Objects from Standing: 3 - able to pick up slipper but needs  supervision  10. Turning to Look 2 - turns sideways only but maintains balance  11. Turning 360 Degrees: 2 - able to turn 360 degrees safely but slowly  12. Alternating Foot on Step: 0 - needs assistance to keep from falling/unable  to try  13. Standing Unsupported One Foot in Front: 4 - able to place foot tandem  independently and hold 30 seconds  14. Standing On One Leg: 1 - tries to lift leg unable to hold for 3 sec but  remains standing independently  Total Score: 40   Overall score of 40 which indicates pt is at greater risk of falling and is  appropriate for ambulating with assistive device.  [name], [name], [name], [name]: Measuring balance in the  elderly: Preliminary development of an instrument. Physiotherapy [place],  [id number], 1989.      Therapeutic/Functional Mobility:            Bed Mobility: Supervision for bed mobility with use of bed rails.            Transfers: CGA for sit to stand, from w/c to bed, w/c <> car  Locomotion/Wheelchair: Gait level            Locomotion/Gait: Ambulated 70 feet with RW and min A for RW  management. Pt has tendency to walk towards L side of walker and occasionally  runs into RW. Pt also with minimal instability of L LE during stance. Pt also  with UE tension and  rigidity, increased WBing through RW during ambulation.            Stairs: Completed 4 6" stairs with BHR and step over step pattern and  min A for safety.    Lower Extremity Orthosis: Patient does not present with foot drop or ankle  instability and does not need an orthotic.    Interventions:  Low Complexity Evaluation: A history with no personal factors and/or  comorbidities that impact the plan of care  An examination of body system([name]) using standardized tests and measures  addressing 1-2 elements from any of the following: body structures and  functions, activity limitations, and/or participation restrictions  A clinical presentation with stable and/or uncomplicated characteristics  Clinical decision-making of low complexity using standardized patient assessment  instrument and/or measurable assessment of functional outcome No treatment  provided today.  Patient was returned to or left in bed. Handoff to nurse completed.  Bed alarm in place and activated.  Oriented to call bell and placed within reach.  Personal items within reach.  Assistive devices positioned out of reach.  Bed placed in lowest position.    Interdisciplinary Educational Needs and Learning Preferences:       Learning Preference: The patient'[name] preferred learning method is:  Explanation.  The patient'[name] preferred learning method is: Demonstration.       Barriers to Learning: No barriers.       Learning Needs: Brain injury, Equipment, Functional activities/mobility,  Stroke    Education Provided: Bed mobility. Functional transfers. Safety. Car transfers.  Gait. Equipment. Stair/curb/environmental barrier negotiation.       Audience: Patient.       Mode: Explanation.  Demonstration.       Response: Applied knowledge.  Verbalized understanding.  Demonstrated skill.    ASSESSMENT  Summary of Deficits and Prognosis: Pt is a 62 yo F who presents to [facility] with left  sided weakness due to CVA. Pt has impaired standing balance and L LE weakness  and  diminished sensation that prevent her from ambulating independently,  completing stairs without assistance, and particpating in outdoor activities  such as walking. Pt has 14 stairs to get to basement level home set up. Patient  will benefit from intense skilled physical therpay using an interdisciplinary  approach to treatment in order to maximize functional independence to promote  safe discharge to home environment.  Rehab Potential: Able to participate in an intensive inpatient interdisciplinary  rehabilitation program, Good family/social support, Good premorbid functional  status, Living in the community premorbidly, Motivated  Barriers to Progress/Discharge: Architectural barriers in home, Medical  condition    CARE Tool  Mobility Functional Assessment  Roll Left and Right: 04 - Supervision or touching assistance:  Helper provides  verbal cues and/or touching/steadying and/or contact guard assistance as patient  completes activity.  Assistance Provided:   Stand by assistance  Assistive Device([name]):   Bed rail  Sit to Lying: 04 - Supervision or touching assistance:  Helper provides verbal  cues and/or touching/steadying and/or contact guard assistance as patient  completes activity.  Assistance Provided:   Stand by assistance  Assistive Device([name]):   Bed rail  Lying to Sitting on Side of Bed: 04 - Supervision or touching assistance:  Helper provides verbal cues and/or touching/steadying and/or contact guard  assistance as patient completes activity.  Assistance Provided:   Stand by assistance  Assistive Device([name]):   Bed rail  Sit to Stand: 04 - Supervision or touching assistance:  Helper provides verbal  cues and/or touching/steadying and/or contact guard assistance as patient  completes activity.  Assistance Provided:   Contact guard  Assistive Device([name]):    RW  Chair/Bed-to-Chair Transfer: Transfer to bed was observed/assessed.   Supervision or touching assistance:  Helper provides verbal cues  and/or  touching/steadying and/or contact guard assistance as patient completes  activity.  Assistance Provided:   Contact guard  Assistive Device([name]):   Walker, Arm rest  Car Transfer: 04 - Supervision or touching assistance:  Helper provides verbal  cues and/or touching/steadying and/or contact guard assistance as patient  completes activity.  Assistance Provided:   Verbal cueing, prompting, or instructing, General  supervision for safety, Tactile cueing, Contact guard  Assistive Device([name]):   Walker  Walk 10 Feet: 03 - Partial/moderate assistance:  Helper does less than half the  effort. Helper lifts, holds or supports trunk or limbs but provides less than  half the effort.  Lift/Holding Assistance Provided:   Left lower extremity  Assistive Device([name]):   Rolling walker  Walk 50 Feet with Two Turns: 03 - Partial/moderate assistance:  Helper does less  than half the effort. Helper lifts, holds or supports trunk or limbs but  provides less than half the effort.  Lift/Holding Assistance Provided:   Left lower extremity  Assistive Device([name]):   Rolling walker  Walk 150 Feet:  88 - Not attempted due to medical or safety concerns.  Walking 10 Feet on Uneven Surface: 03 - Partial/moderate assistance:  Helper  does less than half the effort. Helper lifts, holds or supports trunk or limbs  but provides less than half the effort.  Lift/Holding Assistance Provided:   Left lower extremity  Assistive Device([name]):   Rolling walker  1 Step (curb):  03 - Partial/moderate assistance:  Helper does less than half  the effort. Helper lifts, holds or supports trunk or limbs but provides less  than half the effort.  Lift/Holding Assistance Provided:   Left lower extremity, Trunk  Assistive Device([name]):   Handrail([name])  4 Steps:  03 - Partial/moderate assistance:  Helper does less than half the  effort. Helper lifts, holds or supports trunk or limbs but provides less than  half the effort.  Lift/Holding Assistance Provided:   Left lower  extremity, Trunk  Assistive Device([name]):   Handrail([name])  12 Steps: 38 - Not attempted due to medical or safety concerns.  Picking up Objects: 04 - Supervision or touching assistance:  Helper provides  verbal cues and/or touching/steadying and/or contact guard assistance as patient  completes activity.  Assistance Provided:   Standing by, General supervision for safety  Assistive Device([name]:)   None  Wheelchair: Patient does not use a wheelchair or scooter.  Mobility Discharge Goals (not met at this time):   Lying to Sitting on Side of Bed: Patient completed the activities by  him/herself with no assistance from a helper.   Walk Discharge Goals:   Walk 150 Feet: Patient completed the activities by him/herself with no  assistance from a helper.    Short Term Goals:  Not applicable.  Long Term Goals:   Time frame to achieve long term goal([name]): 5 - 7 days from  initial evaluation on 08/22/18       1. Pt will be mod I with all bed mobility in order to facilitate  independence at home environment.       2. Pt will be mod I with all transfers from a variety of surface heights in  order to prep for [name]/c to home enviornment.       3. Pt will ambulate 350 feet with LRAD and mod I in order to prep for [name]/c  home.       4.  Pt will complete 2 flights of stairs with supervision and single HR in  order to prep for [name]/c home.       5. Pt caregiver will demonstrate proper understanding of assisting pt with  stairs in order to prep for [name]/c home.    Risks/Benefits of Rehabilitation Discussed with Patient/Caregiver: Yes.  Recommendations/Goals for Rehabilitation Discussed with Patient/Caregiver: Yes.    PLAN  Physical Therapy Plan: Physical Therapy is recommended.  Recommended Frequency/Duration/Intensity: 60-120 min/day, 5-7 days a week, for  5-7 days  Activities Contributing Toward Care Plan: TE, TA, NMRE, gait training, assistive  device training, family training, DME education/procurement, individualized HEP,  discharge planning  The  patient is not appropriate for group treatment.    Team Care Plan  Please review Integrated Patient View Care Plan Flowsheet for Team identified  Problems, Interventions, and Goals    Identified problems from team documentation:  Problem: Impaired Cognition  Cognition: Primary Team Goal: Patient will utilize compensatory cognitive  strategies as needed in order to complete routine personal/household management  tasks (meal planning, scheduling appointments, etc.) with 80% accuracy provided  min A./Active    Problem: Impaired Mobility  Mobility: Primary Team Goal: Patient will follow the falls protocol (i.e.  calling for assistance prior to attempting to transfer)./    Problem: Impaired Self-care Mgmt/ADL/IADL  Self Care: Primary Team Goal: Pt will complete total body dressing including  item retrieval at a mod I level using LRAD/Active    Identified problems from this assessment:     Mobility : Pt will be mod I for all bed mobility, transfers, ambulation 350  feet with LRAD, and supervision for two flights of stairs with single HR.    Discipline:  Physical Therapy    3 Hour Rule Minutes: 60 minutes of PT treatment this session count towards  intensity and duration of therapy requirement. Patient was seen for the full  scheduled time of PT treatment this session.  Therapy Mode Minutes: Individual: 60 minutes.    Signed by: Estevan Ryder, PT Student 08/22/2018 12:00:00 PM     - CoSigned By: Maryjean Ka, PT, DPT 08/22/2018 7:49:45 PM

## 2018-08-22 NOTE — Progress Notes (Signed)
Pt had 3 cards ( 1 visa credit/debit, SS card and Medicare) all placed in security envelope and taken to place in locked pt secured belongs. Security Officer Jeannett Senior on night shift ensured that necessary papers were filled in correctly to ensure pt will be able to retrieve personal belongs at discharge.

## 2018-08-22 NOTE — Rehab Progress Note ([facility]) (Signed)
[name] [name]  MRN: [id number]  Account: [id number]  Session Start: 08/22/2018 12:00:00 AM  Session Stop: 08/22/2018 12:00:00 AM    Rehabilitation Nursing  Inpatient Rehabilitation Shift Assessment    Rehab Diagnosis: CVA  Demographics:            Age: 50Y            Gender: Female  Primary Language: English    Date of Onset:  08/18/18  Date of Admission: 08/21/2018 6:06:19 PM    Rehabilitation Precautions Restrictions:   Risk for Fall, seizure precautions    Patient Report: "Pt did c/o chest pain this am. " RRT was called. Pt remains on  the unit due to labs, VS and cardiac marker within normal limits.  Patient/Caregiver Goals:  To be able to walk independently without a device or  with a cane like before.    Wounds/Incisions: No wounds or incisions.    Medication Review: No clinically significant medication issues identified this  shift.    Bowel and Bladder Output:                       Bladder (# only)             Bowel (# only)  Number of Episodes  Continent            2                            0  Incontinent          0                            0    Care Tool  Self-Care Functional Assessment  Eating: 06 - Independent:  Patient completed the activities by him/herself with  no assistance from a helper and without use of assistive devices.  Oral Hygiene: 06 - Independent:  Patient completed the activities by him/herself  with no assistance from a helper and without use of assistive devices.  Toileting Hygiene: 04 - Supervision or touching assistance:  Helper provides  verbal cues and/or touching/steadying and/or contact guard assistance as patient  completes activity.  Toileting Equipment:   Retail banker Provided:   Therapist, music):   None  Shower/Bathe Self: 88 - Not attempted due to medical or safety concerns.  Upper Body Dressing: Not observed/assessed.  Lower Body Dressing: Not observed/assessed.  Putting On/Taking Off Footwear: Not observed/assessed.    Mobility Functional  Assessment  Sit to Lying: 06 - Independent:  Patient completed the activities by him/herself  with no assistance from a helper and with use of assistive devices.  Assistive Device(s):   Bed rail  Lying to Sitting on Side of Bed: 06 - Independent:  Patient completed the  activities by him/herself with no assistance from a helper and with use of  assistive devices.  Assistive Device(s):   Bed rail  Sit to Stand: 04 - Supervision or touching assistance:  Helper provides verbal  cues and/or touching/steadying and/or contact guard assistance as patient  completes activity.  Assistance Provided:   Contact guard  Assistive Device(s):   Bed rail  Chair/Bed-to-Chair Transfer: Transfering to and from a bed were  observed/assessed. 04 - Supervision or touching assistance:  Helper provides  verbal cues and/or touching/steadying and/or contact guard assistance as patient  completes activity.  Assistance Provided:  Contact guard  Assistive Device(s):   Bed rails  Toilet Transfer: 04 - Supervision or touching assistance:  Helper provides  verbal cues and/or touching/steadying and/or contact guard assistance as patient  completes activity.  Assistance Provided:   Contact guard  Assistive Device(s):   Grab bars    Education Provided:    Education Provided: Safety issues and interventions. Fall protocol. Safety.  Functional transfers. Medication. Name and dosage. Administration. Purpose. Side  Effects. Interaction. Labs. Anticoagulants.       Audience: Patient.       Mode: Explanation.       Response: Needs reinforcement.    Long Term Goals: 1. Patient will be able to list 3 risk factors for stroke and  will be able to articulate the meaning and use of the FAST acronym for stroke  identification   2. Patient will know the names, indications, and side effects of all her  medications 100 percent of the time  2 weeks from 08/21/18  Short Term Goals: 1. Patient will be able to articulate the meaning of the FAST  acronym for stroke  identification   2. Patient will know the names and indications of her medications 75 percent of  the time  1 week from 08/21/18    PROGRESS TOWARD GOALS: Pain management.  Interventions: Provide health teachings on pain monitoring using numeric scale,  Use of clinical indicators to monitor pain level, Explain medications ordered  for pain management, Determine etiology of pain, Evaluate, assess outcomes,  and/or effectiveness of pain management, Assist patient to identify other/or  non- pharmacologic measures to alleviate pain (e.g., positioning, ROM exercises,  splinting, slow stretching exercises), Reinforce the use of alternative therapy,  treatments and/or other pain modalities recommended by MD/PT/OT.    Response to intervention(s): Please don't allow pain to get out of control. It's  much more difficult to get under control once out.    Ongoing Education/Training: Pt did recieve 2mg  of morphine IVP for c/o chest  pain with stated relief.     Bowel management.  Interventions: Abdominal assessment, Rectal examination, Evaluate progress  towards goal/s, Assessment of the anal sphincter tone, Determine bowel pattern,  Assess patient?s ability to transfer, balance, and tolerance, Assess patient?s  ability to learn and ability to direct others, Develop safe, effective and  realistic bowel program, Allow patient and/or family to be involved in  developing safe, effective and realistic goal, Identify equipment needs, Provide  comfort, explain procedure, Use of the least noxious stimulant/s to produce  effective results, Use of chemical stimulants such as stool meds, suppository,  Assist patient to assume upright position for (possible) evacuation, Perform  digital stimulation, Manual removal (if evacuation does not occur)    Response to intervention(s): It's best practice to have a bowel movement daily.    Ongoing Education/Training: Pt  states no BM for 3 days. Encourage high fiber  foods with each  meal.    PLAN:  Rehab Nursing Specific Interventions:  Continue with the current Nursing Plan of Care.    TEAM CARE PLAN  Identified problems from team documentation:  Problem: Impaired Mobility  Mobility: Primary Team Goal: Patient will follow the falls protocol (i.e.  calling for assistance prior to attempting to transfer)./    Please review Integrated Patient View Care Plan Flowsheet for Team identified  Problems, Interventions, and Goals.    Signed by: Satira Anis, RN 08/22/2018 7:11:00 AM

## 2018-08-23 LAB — URINALYSIS REFLEX TO MICROSCOPIC EXAM - REFLEX TO CULTURE
Bilirubin, UA: NEGATIVE
Glucose, UA: NEGATIVE
Ketones UA: NEGATIVE
Leukocyte Esterase, UA: NEGATIVE
Nitrite, UA: NEGATIVE
Protein, UR: NEGATIVE
Specific Gravity UA: 1.015 (ref 1.001–1.035)
Urine pH: 6 (ref 5.0–8.0)
Urobilinogen, UA: NEGATIVE mg/dL (ref 0.2–2.0)

## 2018-08-23 MED ORDER — BUTALBITAL-APAP-CAFFEINE 50-325-40 MG PO TABS
1.00 | ORAL_TABLET | Freq: Four times a day (QID) | ORAL | Status: DC | PRN
Start: 2018-08-23 — End: 2018-08-29
  Administered 2018-08-27: 1 via ORAL
  Filled 2018-08-23: qty 1

## 2018-08-23 MED ORDER — BISACODYL 10 MG RE SUPP
10.00 mg | Freq: Every day | RECTAL | Status: DC | PRN
Start: 2018-08-23 — End: 2018-08-29
  Administered 2018-08-23: 10 mg via RECTAL
  Filled 2018-08-23: qty 1

## 2018-08-23 MED ORDER — PSYLLIUM 58.12 % PO PACK
1.00 | PACK | Freq: Every day | ORAL | Status: DC
Start: 2018-08-23 — End: 2018-08-29
  Administered 2018-08-23 – 2018-08-28 (×6): 1 via ORAL
  Filled 2018-08-23 (×6): qty 1

## 2018-08-23 NOTE — Progress Notes (Signed)
[facility] Attending Face to Face Progress Note    Date Time: 08/23/18 3:59 PM  Patient Name: [name],[name]    Admission date:  08/21/2018    Functional Status/Update:     [x] I I reviewed patient's therapy notes to assess functional status and ongoing need for therapies.   Of note:     Supine to sit with HOB  increased with min assist.  Functional mobility to bathroom with min asssist  with rw.  Pt completed bathing with cga for balance when standing.  Ub dressing  supervision, lb dressing cga.  Pt completed grooming in shower while seated.  Functional mobility back to w/c with rw with min assist for balance.      Subjective and interval development:   Complains of constipation for 3-4 days and intermittent nausea, denies abdominal pain    I rounded with a RN    Review of Systems:   A comprehensive review of systems was: No fevers, chills, vomiting, chest pain, shortness of breath, cough, headache, double vision    Objective:     Intake and Output Summary (Last 24 hours) at Date Time    Intake/Output Summary (Last 24 hours) at 08/23/2018 1559  Last data filed at 08/23/2018 1300  Gross per 24 hour   Intake 480 ml   Output -   Net 480 ml     P.O.: 240 mL (08/23/18 1300)                   Vitals:    08/23/18 0727 08/23/18 0753 08/23/18 0816 08/23/18 1401   BP: 110/71 119/76 123/79 116/67   Pulse: 76 80 78    Resp:    17   Temp:    98.9 F (37.2 C)   TempSrc:    Oral   SpO2: 100% 98%  97%   Weight:       Height:         Physical Examination:  General appearance: Appears well.  In no acute distress.   ZO:XWRUE rate and rhythm are regular. No murmurs/rubs/gallops.  Pulm:No wheezes, rales, or rhonchi. Decreased breath sounds at both bases.  AVW:UJWJ. Non-tender. Hypoactive bowel sounds.  Neuro: Awake, anxious, fluent speech  Face is symmetric and without weakness.   VIII:Hearing intact bilaterally to conversational speech.   IMusculoskeletal:   ROM-within functional limits      Manual muscle strength testing:  Muscle  group Left Right   Shoulder abductors 3 5   Elbow flexors 4 5   Elbow extensors 4 5   Finger flexors 5 5   Hip flexors 4 4   Knee flexors 4 5   Knee extensors 4 5   Ankle dorsiflexors 4 5   Ankle plantarflexors 5 5   EHL 4 5     Extremities: no calf tenderness        New Labs:   No results for input(s): GLUCOSEWHOLE in the last 24 hours.      Labs:   No results for input(s): GLUCOSEWHOLE in the last 24 hours.    Recent Labs   Lab 08/21/18  2032   WBC 6.12   Hgb 9.8*   Hematocrit 30.9*   Platelets 297        Recent Labs   Lab 08/22/18  0319   Sodium 140   Potassium 4.2   Chloride 105   CO2 25   BUN 9   Creatinine 0.9   Calcium 9.1   Albumin 3.2*  Protein, Total 6.1   Bilirubin, Total 0.2   Alkaline Phosphatase 75   ALT 9   AST (SGOT) 12   Glucose 111*   Magnesium 1.9   Phosphorus 4.3   Troponin less than 0.01              Rads:     Rads:   Radiological Procedure reviewed.    Radiology: all results from this admission  Xr Chest 2 Views    Result Date: 08/22/2018   No active disease Kinnie Feil, MD 08/22/2018 4:30 PM    Mri Brain W Wo Contrast    Result Date: 08/22/2018  1.  Diffusion weighted imaging does not show definite evidence of acute or subacute infarct. 2.  Foci of hyperintense T2 and FLAIR signal are seen in the white matter of the cerebral hemispheres. This is a nonspecific finding. It may be related to chronic ischemic change from small vessel disease. 3.  No abnormal enhancing lesions are seen in the brain following contrast administration. Tana Felts, MD 08/22/2018 11:59 PM    Current medications:     Scheduled Meds: PRN Meds:    atorvastatin, 40 mg, Oral, QHS  dabigatran, 150 mg, Oral, Q12H SCH  fluticasone furoate-vilanterol, 1 puff, Inhalation, QAM  furosemide, 20 mg, Oral, Daily  lamoTRIgine, 150 mg, Oral, BID  metoprolol succinate XL, 25 mg, Oral, Daily  pantoprazole, 40 mg, Oral, BID AC  NON-FORMULARY PAT OWN MED order form, 20 mg, Oral, QHS  QUEtiapine, 50 mg, Oral, QHS  rOPINIRole, 1 mg,  Oral, QHS        Continuous Infusions:   acetaminophen, 650 mg, Q4H PRN  albuterol, 2.5 mg, Q6H PRN  butalbital-acetaminophen-caffeine, 1 tablet, Q6H PRN  lactulose, 20 g, BID PRN  ondansetron, 4 mg, Q6H PRN  traMADol, 50 mg, BID PRN          Assessment:   62 y.o. 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     Plan:  REHAB: Continue comprehensive and intensive inpatient rehab program, including  Physical therapy 60-120 min daily, 5-6 times per week   Occupational therapy 60-120 min daily, 5-6 times per week   Speech therapy 60-120 min daily, 5-6 times per week   Case management   Rehabilitation nursing    Will continue to address the following impairments and issues:     Constipation: We will add Metamucil, Dulcolax today, continue lactulose as needed    Headaches: Fioricet as needed started    HTN monitoring /management:  Stable, SBP-119-123, continue Lasix, Toprol-XL  Paroxysmal A. fib: Continue Pradaxa for stroke prevention    Prevention of skin breakdown: Frequent repositioning  Pain Follow-up : tylenol PRN    DVT ppx: Pradaxa  Dyslipidemia:diet,statin        Medication Review:  1. A drug regimen review was completed: Yes  2. Were any drug issues found during review?: No              Coordination of care:  Care coordinated with  Rehab nurse    Individualized Overall Plan of Care for Rehabilitation was filled in in Medilinks.      Discharge planning:    Signed by: Earl Many DO     [facility]      This note was generated by the Dragon speech recognition system and may contain inherent errors or omissions not intended by  the user. Grammatical errors, random word insertions, deletions and pronoun errors  are occasional consequences of this technology due to software limitations. Not all errors are caught or corrected. If there are questions or concerns about the content of this note  or information contained within the body of this dictation they should be addressed directly with the author for clarification.

## 2018-08-23 NOTE — Progress Notes (Signed)
MEDICINE PROGRESS NOTE  [facility], DIVISION OF HOSPITALIST MEDICINE   [facility]   [place] Pager: [phone]      Date Time: 08/23/18 10:37 AM  Patient Name: [name]  Attending Physician: Earl Many, DO  Hospital Day: 3  Assessment:     Active Hospital Problems    Diagnosis   . Chest pain   . CVA (cerebral vascular accident)       62 year old female with past medical history significant for hypertension, asthma, hyperlipidemia, diet-controlled diabetes, seizure, who was presented left sided weakness after recent presumed stroke     Plan:   Atypical chest pain  No EKG changes  Troponin negative  Resolved  Improved with tramadol  Discussed with Dr Lucianne Muss her cardiologist, has had extensive cardiac work up, no need for further testing at this time  Chest xray with no acute findings         Left sided weakness/numbness  Presented with presumed CVA form OSH  Currently on pradaxa  MRI done, no acute evidence of cva   Dr Francesco Sor on board, will review  Continue statin        Paroxymsmal Afib  Currently in sinus  S/p linq recorder with medtronic              Confirmed with Dr Lucianne Muss office  On pradaxa   Continue metoprolol    Asthma  Not in exacerbation, mild wheeze will request nebulizer treatment  Continue inhalers    Headache  Complaining of mild headache  Will start Fiorcet prn       HLD  Continue statin    HTN  On metoprolol    Diet controlled diabetes    Pysch:  Continue current meds  States she takes Jordan as well, its not on Formulary, she will have family bring it in and it can be placed in Epic under Non formulary, send to pharmacy to verify    DVT ppx: on pradaxa    Discussed with: patient, staff      Subjective     CC: CVA (cerebral vascular accident)    Diet mechanical soft Liquid consistency: Thin; Additional restrictions: Consistent carbohydrate, Cardiac  NUTRITION DISLIKES Frequency: All meals; Preference: No PEAS  HPI/Subjective: No acute events overnight,  patient states chest pain resolved, complaining of mild headache, no shortness of breath  Review of Systems:   Review of Systems - Negative except as above in HPI    Physical Exam:     Temp:  [98.1 F (36.7 C)-98.2 F (36.8 C)] 98.1 F (36.7 C)  Heart Rate:  [73-91] 78  Resp Rate:  [17-18] 18  BP: (103-129)/(66-83) 123/79  No intake or output data in the 24 hours ending 08/23/18 1037 General: awake, alert ,in no acute distress  Cardiovascular: regular rate and rhythm, no murmurs  Lungs: clear to auscultation bilaterally, no additional sounds  Abdomen: soft, non-tender, non-distended; normoactive bowel sounds  Extremities: no edema  Neurological: Alert and oriented X 3, moves all extremities.            Meds:   Medications were reviewed:  Scheduled Meds:  Current Facility-Administered Medications   Medication Dose Route Frequency   . atorvastatin  40 mg Oral QHS   . dabigatran  150 mg Oral Q12H SCH   . fluticasone furoate-vilanterol  1 puff Inhalation QAM   . furosemide  20 mg Oral Daily   . lamoTRIgine  150 mg Oral BID   . metoprolol succinate  XL  25 mg Oral Daily   . pantoprazole  40 mg Oral BID AC   . NON-FORMULARY PAT OWN MED order form  20 mg Oral QHS   . QUEtiapine  50 mg Oral QHS   . rOPINIRole  1 mg Oral QHS     Continuous Infusions:  PRN Meds:.acetaminophen, albuterol, butalbital-acetaminophen-caffeine, lactulose, ondansetron, traMADol  Labs/Radiology:   Imaging personally reviewed, including: all available   Xr Chest 2 Views    Result Date: 08/22/2018   No active disease Kinnie Feil, MD 08/22/2018 4:30 PM    Mri Brain W Wo Contrast    Result Date: 08/22/2018  1.  Diffusion weighted imaging does not show definite evidence of acute or subacute infarct. 2.  Foci of hyperintense T2 and FLAIR signal are seen in the white matter of the cerebral hemispheres. This is a nonspecific finding. It may be related to chronic ischemic change from small vessel disease. 3.  No abnormal enhancing lesions are seen in the  brain following contrast administration. Tana Felts, MD 08/22/2018 11:59 PM    No results for input(s): GLUCOSEWB in the last 24 hours.  Recent Labs   Lab 08/22/18  0319   Sodium 140   Potassium 4.2   Chloride 105   BUN 9   Creatinine 0.9   EGFR >60.0   Glucose 111*   Calcium 9.1     Recent Labs   Lab 08/21/18  2032   WBC 6.12   Hgb 9.8*   Hematocrit 30.9*   Platelets 297         Recent Labs   Lab 08/22/18  0319   Alkaline Phosphatase 75   Bilirubin, Total 0.2   Bilirubin, Direct 0.1   ALT 9   AST (SGOT) 12         This note was generated by the Epic EMR system/ Dragon speech recognition and may contain inherent errors or omissions not intended by the user. Grammatical errors, random word insertions, deletions and pronoun errors  are occasional consequences of this technology due to software limitations. Not all errors are caught or corrected. If there are questions or concerns about the content of this note or information contained within the body of this dictation they should be addressed directly with the author for clarification.    Signed by: Carley Hammed, MD

## 2018-08-23 NOTE — Progress Notes (Signed)
Date Time: 08/23/18 2:28 PM  Patient Name: [name],[name]  Attending Physician: Earl Many, DO  Patient Class: Inpatient Rehab  Hospital Day: 2            NEUROLOGY PROGRESS NOTE             Assessment/Plan   Left-sided numbness, still ongoing, patient's brain MRI without any acute or subacute stroke, patient's initial symptoms began on 10/29, so not sure what the diagnosis is actually could be a small stroke not visualized, on brain MRI      Patient already on Pradaxa  No need for vascular imaging given CT Angie was done at [facility]  Echo was done last month, given there is no acute stroke, I do not see a need for repeating an echo at this point      Subjective:   Patient Seen and Examined. I feel better     Review of Systems:   No headache, eye, ear nose, throat problems; no coughing or wheezing or shortness of breath, No chest pain or orthopnea, no abdominal pain, nausea or vomiting, No pain in the body or extremities, no psychiatric, neurological, endocrine, hematological or cardiac complaints except as noted above.           Physical Exam:   BP 116/67   Pulse 78   Temp 98.9 F (37.2 C) (Oral)   Resp 17   Ht 1.651 m (5\' 5" )   Wt 97.5 kg (215 lb)   SpO2 97%   BMI 35.78 kg/m   Awake alert oriented x3 speech fluent smile symmetric, moving both arms and legs fairly well for me, mildly reduced light touch temperature in the left arm and left leg    Meds:      Scheduled Meds: PRN Meds:    atorvastatin, 40 mg, Oral, QHS  dabigatran, 150 mg, Oral, Q12H SCH  fluticasone furoate-vilanterol, 1 puff, Inhalation, QAM  furosemide, 20 mg, Oral, Daily  lamoTRIgine, 150 mg, Oral, BID  metoprolol succinate XL, 25 mg, Oral, Daily  pantoprazole, 40 mg, Oral, BID AC  NON-FORMULARY PAT OWN MED order form, 20 mg, Oral, QHS  QUEtiapine, 50 mg, Oral, QHS  rOPINIRole, 1 mg, Oral, QHS        Continuous Infusions:   acetaminophen, 650 mg, Q4H PRN  albuterol, 2.5 mg, Q6H PRN  butalbital-acetaminophen-caffeine, 1 tablet, Q6H  PRN  lactulose, 20 g, BID PRN  ondansetron, 4 mg, Q6H PRN  traMADol, 50 mg, BID PRN                Labs:     Recent Labs   Lab 08/22/18  0319   Glucose 111*   BUN 9   Creatinine 0.9   Calcium 9.1   Sodium 140   Potassium 4.2   Chloride 105   CO2 25   Albumin 3.2*   Phosphorus 4.3   Magnesium 1.9   AST (SGOT) 12   ALT 9   Bilirubin, Total 0.2   Alkaline Phosphatase 75     Recent Labs   Lab 08/21/18  2032   WBC 6.12   Hgb 9.8*   Hematocrit 30.9*   MCV 83.7   MCH 26.6   MCHC 31.7   Platelets 297         No results for input(s): PTT, PT, INR in the last 72 hours.       Radiology Results (24 Hour)     Procedure Component Value Units Date/Time    MRI Brain  Lacretia Nicks WO Contrast [[id number]] Collected:  08/22/18 2353    Order Status:  Completed Updated:  08/23/18 0003    Narrative:       MRI BRAIN W WO CONTRAST    CLINICAL INDICATION:   Possible new CVA    TECHNIQUE: Multisequence multiplanar MR images obtained of the brain  prior to and following intravenous administration of 10 mL Gadavist  contrast    COMPARISON:    None available    FINDINGS: There is no mass effect or midline shift. There are no  abnormal intra or extra-axial fluid collections. Cortical sulci and  lateral ventricles are within normal limits for size and configuration.  Cavum septa pellucida is noted. The cerebellum and brainstem appear  normal. Scattered foci of hyperintense T2 and FLAIR signal are seen in  the white matter of the cerebral hemispheres. Diffusion-weighted imaging  does not show definite evidence of acute or subacute infarct. No  abnormal enhancing lesions are seen in the brain following contrast  administration.  Orbits appear normal and symmetric.  Paranasal sinuses appear clear.  The craniocervical junction appears normal.      Impression:           1.  Diffusion weighted imaging does not show definite evidence of acute  or subacute infarct.    2.  Foci of hyperintense T2 and FLAIR signal are seen in the white  matter of the cerebral  hemispheres. This is a nonspecific finding. It  may be related to chronic ischemic change from small vessel disease.    3.  No abnormal enhancing lesions are seen in the brain following  contrast administration.            Tana Felts, MD   08/22/2018 11:59 PM    XR Chest 2 Views [[id number]] Collected:  08/22/18 1630    Order Status:  Completed Updated:  08/22/18 1634    Narrative:       INDICATION: Atypical chest pain    COMPARISON: No prior    FINDINGS: PA and lateral views of the chest were obtained.   The heart is normal in size.   The  mediastinum and hilar structures are unremarkable.   The lung fields are clear.   There is no pleural effusion.  No pneumothorax.  The visualized osseous structures demonstrate no acute abnormality.       Impression:        No active disease    Kinnie Feil, MD   08/22/2018 4:30 PM           All brain imaging (MRI, CT) personally reviewed.    Case discussed with dr Timmie Foerster       This note was generated by the Epic EMR system/ Dragon speech recognition and may contain inherent errors or omissions not intended by the user. Grammatical errors, random word insertions, deletions and pronoun errors  are occasional consequences of this technology due to software limitations.     Not all errors are caught or corrected. If there are questions or concerns about the content of this note or information contained within the body of this dictation they should be addressed directly with the author for clarification.      Signed by: Cathe Mons, MD  Spectralink: [id number]      Answering Service: [phone]

## 2018-08-23 NOTE — Progress Notes (Signed)
[name]  MRN: [id number]  Account: [id number]  Session Start: 08/23/2018 12:00:00 AM  Session Stop: 08/23/2018 12:00:00 AM    Physical Medicine and Rehabilitation  Initial Individualized Interdisciplinary Plan Of Care    Rehab Diagnosis: CVA  Demographics:            Age: 62Y            Gender: Female    Plan Of Care  Anticipated Discharge Date/Estimated Length of Stay: 08/28/2018  Anticipated Discharge Destination: Community discharge with assistance  Fall Risk Level: Yellow (Medium)  Medical Necessity Expected Level Rationale: 62 year old right-hand-dominant  female with mobility/ADL dysfunction 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, worse in the left upper extremity  Intensity and Duration: an average of 3 hours/5 days per week  Medical Supervision and 24 Hour Rehab Nursing: x  Physical Therapy: x  PT Intensity/Duration: 60-120'daily for 5-6 d/wk  Occupational Therapy: x  OT Intensity/Duration: 60-120'daily for 5-6 d/wk  Speech and Language Therapy: x  SLP Intensity/Duration: 60-120'daily for 5-6 d/wk  Case Management: Arlie Solomons, CM  Nurse: Christain Sacramento, RN  Occupational Therapy: Ila Mcgill, OT  Physical Therapy: Delsa Grana, PT  Physician: Hendricks Limes, MD  Speech Language Pathology: Lyndle Herrlich, SLP    The following is a list of patient problems that have been identified by the  interdisciplinary team:    Problem: Impaired Cognition  Team Identified Barrier to Discharge: Yes  Interventions:  Decrease environmental stimuli: Active  Safety awareness training: Active  Compensatory strategies: Active  Cognition: Primary Team Goal/Status: Patient will utilize compensatory cognitive  strategies as needed in order to complete routine personal/household management  tasks (meal planning, scheduling appointments, etc.) with 80% accuracy provided  min A. / Active    Problem: Impaired Mobility  Mobility: Primary Team Goal/Status: Pt will be mod I for all bed mobility,  transfers,  ambulation 350 feet with LRAD, and supervision for two flights of  stairs with single HR. /    Problem: Impaired Self-care Mgmt/ADL/IADL  Self Care/ADL/IADL Status Update: set-up/SPV for total body dressing; CG toilet  transfer and toileting; SPV bathing at sinkside  Team Identified Barrier to Discharge: Yes  Interventions:  Adaptive equipment training: Active  Compensatory strategies: Active  Encourage patient to participate in activities of daily living: Active  Self Care: Primary Team Goal/Status: Pt will complete total body dressing  including item retrieval at a mod I level using LRAD / Active    Comments: Prevention of skin breakdown: Frequent repositioning    Assessment of swallowing:  Ordered    Pain Follow-up : tylenol PRN    DVT ppx: Pradaxa  Prevention of cardiovascular disease including stroke and IHD:    HTN monitoring /management: Lasix, Toprol-XL    Signed by: Hendricks Limes, DO 08/23/2018 3:03:00 PM    Physician CoSigned By: Hendricks Limes 08/23/2018 15:03:19

## 2018-08-23 NOTE — Rehab Progress Note (Medilinks) (Signed)
Patient given Lactulose, Metamucil, Dulcolax Suppository and Prune Juice today and had a large BM. Patient states she is now feeling much better. Daughter in attendance.

## 2018-08-23 NOTE — Progress Notes (Signed)
Pt's non formulary medication. LATUDA was not given this shift. Pt says medication is still at home and daughter will bring it in today.

## 2018-08-23 NOTE — Progress Notes (Signed)
Chaplain Service      Background:  Visit Type: Initial was made by Chaplain with patient, [name], based on Source: Chaplain Initiated.  Present at Visit: patient.  Spiritual Care Provided to: patient only.  Length of Visit: 16-30 minutes .    Summary:  Spiritual Care Interventions: Provided reflective and compassionate listening, Provided comfort, encouragement,affirmation      Spiritual Care Outcomes: Patient expressed appreciation of visit

## 2018-08-23 NOTE — Rehab Progress Note ([facility]) (Signed)
[name]  MRN: [id number]  Account: [id number]  Session Start: 08/23/2018 7:00:00 AM  Session Stop: 08/23/2018 8:00:00 AM    Occupational Therapy  Inpatient Rehabilitation Progress Note - Brief    Rehab Diagnosis: CVA  Demographics:            Age: 80Y            Gender: Female  Rehabilitation Precautions/Restrictions:   DM  diet consistent carbs  skin  Risk for Fall, seizure precautions, aspiration    SUBJECTIVE  Patient Report: My daughter helped with my meds before if I forgot.  She is a  stay at home mom.  Pain: Patient currently without complaints of pain.    OBJECTIVE  Vital Signs:  bp:  103/66, hr 75, o2 95  bp: 106/67  bp:  110/71, hr 76, o2 100  bp:  119/76, hr 80, o2 99    Interventions:       Self Care/Home Management:  Pt seen for adls/shower this am.  Ot covered iv  and removed telemetry after notifying telemetry.  Supine to sit with HOB  increased with min assist.  Functional mobility to bathroom with min asssist  with rw.  Pt completed bathing with cga for balance when standing.  Ub dressing  supervision, lb dressing cga.  Pt completed grooming in shower while seated.  Functional mobility back to w/c with rw with min assist for balance.  Patient was left seated in chair in his/her room. Handoff to nurse completed.  Chair alarm in place and activated.  Oriented to call bell and placed within reach.  Personal items within reach.  Pain Reassessment: Pain was not reassessed as no pain was reported.    Education Provided:    Education Provided: Activities of daily living.       Audience: Patient.       Mode: Explanation.  Demonstration.       Response: Needs practice.    ASSESSMENT  Pt doing well but noticed ataxic movements in LLE during walking.  Pt would  benefit from ot to address neuro reed of L side of body to increase independence  and decrease fall risks.    CARE Tool  Self-Care Functional Assessment    Oral Hygiene: 05 - Setup or clean-up assistance:  Helper sets up or cleans up;  patient  completes activity. Helper assists only prior to or following the  activity.  Assistance Provided:   Setup assistance  Assistive Device(s):   None    Shower/Bathe Self: 03 - Partial/moderate assistance:  Helper does less than half  the effort. Helper lifts, holds or supports trunk or limbs but provides less  than half the effort.  Location:  Shower.  Lift/Holding Assistance Provided:   Left lower extremity  Assistive Device(s):   Grab bar/arm rest to maintain balance, Hand held shower  Upper Body Dressing: Dressing and undressing were observed/assessed.   05 - Setup or clean-up assistance:  Helper sets up or cleans up; patient  completes activity. Helper assists only prior to or following the activity.  Assistance Provided:   Setup assistance  Assistive Device(s):   None  Lower Body Dressing: Dressing and undressing were observed/assessed. 03 -  Partial/moderate assistance:  Helper does less than half the effort. Helper  lifts, holds or supports trunk or limbs but provides less than half the effort.  Lift/Holding Assistance Provided:   Left lower extremity  Assistive Device(s):   None  Putting On/Taking Off Footwear: Putting on  and taking off footwear were  observed/assessed. 04 - Supervision or touching assistance:  Helper provides  verbal cues and/or touching/steadying and/or contact guard assistance as patient  completes activity.  Assistance Provided:   Stand by assistance  Assistive Device(s):   None    Mobility Functional Assessment      Sit to Stand: 03 - Partial/moderate assistance:  Helper does less than half the  effort. Helper lifts, holds or supports trunk or limbs but provides less than  half the effort.  Lift/Holding Assistance Provided:   Left lower extremity  Assistive Device(s):   Bed rail, Elevated bed/surface, None        PLAN  Continued Occupational Therapy is recommended.  Recommended Frequency/Duration/Intensity: 60-120 minutes per day; 5-6x a week;  one on one and group therapy as  appropriate  Continued Activities Contributing Toward Care Plan: NMRE, ADL retraining,  transfer training, therapeutic activities, therpeutic exercise, DME assessment,  IADL retraining, stroke education and d/c planning    3 Hour Rule Minutes: 60 minutes of OT treatment this session count towards  intensity and duration of therapy requirement. Patient was seen for the full  scheduled time of OT treatment this session.  Therapy Mode Minutes: Individual: 60 minutes.    Signed by: Ila Mcgill, OTR/L 08/23/2018 8:00:00 AM

## 2018-08-23 NOTE — Progress Notes (Signed)
Patient in room 512 verbalized concern  to writer that "my right has been violated". Stated she had shortness of breath during  physical therapy and wanted to sit down, but therapist responded "no".  Writer did service recovery with her and also spoke to therapist regarding patient concerns. Patient responded she is ok. Later received a call from operator that patient had called to connected her to nurse advocate regarding concern. Theme park manager Robie Ridge), Ange Architectural technologist) via Sport and exercise psychologist. Notified Dr. Timmie Foerster and responded "get neurologist involved". Received call from from Ange who stated will get in touch with Therapist and patient.

## 2018-08-23 NOTE — Rehab Progress Note ([facility]) (Signed)
Patient LBM 10/26, patient states its not new she always takes long before having a BM. Lactulose 20mg  given and patient states she will also try prune juice, same given as requested. No complaint of chest pain today but reports headache, which is relief by Tramadol. New order made for Floricet today.

## 2018-08-23 NOTE — Progress Notes (Signed)
Patient just returned to room from physical therapy. Complained of Shortness of breath. Vital signs checked and was stable (see flow sheet). Offered to give oxygen for comfort but refused and responded, "just need to relax". No respiratory distress noted at this time.

## 2018-08-23 NOTE — Progress Notes (Deleted)
[name]  MRN: [id number]  Account: [id number]  Session Start: 08/23/2018 12:00:00 AM  Session Stop: 08/23/2018 12:00:00 AM    Physical Medicine and Rehabilitation  Initial Individualized Interdisciplinary Plan Of Care    Rehab Diagnosis: CVA  Demographics:            Age: 62Y            Gender: Female    Plan Of Care  Anticipated Discharge Date/Estimated Length of Stay: 08/28/2018  Anticipated Discharge Destination: Community discharge with assistance  Fall Risk Level: Yellow (Medium)  Medical Necessity Expected Level Rationale: 62 year old right-hand-dominant  female with mobility/ADL dysfunction 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, worse in the left upper extremity  Intensity and Duration: an average of 3 hours/5 days per week  Medical Supervision and 24 Hour Rehab Nursing: x  Physical Therapy: x  PT Intensity/Duration: 60-120'daily for 5-6 d/wk  Occupational Therapy: x  OT Intensity/Duration: 60-120'daily for 5-6 d/wk  Speech and Language Therapy: x  SLP Intensity/Duration: 60-120'daily for 5-6 d/wk  Case Management: Arlie Solomons, CM  Nurse: Christain Sacramento, RN  Occupational Therapy: Ila Mcgill, OT  Physical Therapy: Delsa Grana, PT  Physician: Hendricks Limes, MD  Speech Language Pathology: Lyndle Herrlich, SLP    The following is a list of patient problems that have been identified by the  interdisciplinary team:    Problem: Impaired Cognition  Team Identified Barrier to Discharge: Yes  Interventions:  Decrease environmental stimuli: Active  Safety awareness training: Active  Compensatory strategies: Active  Cognition: Primary Team Goal/Status: Patient will utilize compensatory cognitive  strategies as needed in order to complete routine personal/household management  tasks (meal planning, scheduling appointments, etc.) with 80% accuracy provided  min A. / Active    Problem: Impaired Mobility  Mobility: Primary Team Goal/Status: Pt will be mod I for all bed mobility,  transfers,  ambulation 350 feet with LRAD, and supervision for two flights of  stairs with single HR. /    Problem: Impaired Self-care Mgmt/ADL/IADL  Self Care/ADL/IADL Status Update: set-up/SPV for total body dressing; CG toilet  transfer and toileting; SPV bathing at sinkside  Team Identified Barrier to Discharge: Yes  Interventions:  Adaptive equipment training: Active  Compensatory strategies: Active  Encourage patient to participate in activities of daily living: Active  Self Care: Primary Team Goal/Status: Pt will complete total body dressing  including item retrieval at a mod I level using LRAD / Active    Comments: Prevention of skin breakdown: Frequent repositioning    Assessment of swallowing:  Ordered    Pain Follow-up : tylenol PRN    DVT ppx: Pradaxa  Prevention of cardiovascular disease including stroke and IHD:    HTN monitoring /management: Lasix, Toprol-XL    Signed by: Hendricks Limes, DO 08/23/2018 2:57:00 PM    Physician CoSigned By: Hendricks Limes 08/23/2018 14:57:31

## 2018-08-23 NOTE — Rehab Progress Note ([facility]) (Signed)
[name]  MRN: [id number]  Account: [id number]  Session Start: 08/22/2018 12:00:00 AM  Session Stop: 08/22/2018 12:00:00 AM    Case Management  Inpatient Rehabilitation Team Conference    Conference Date/Time: 08/22/2018 1:48:00 PM    Demographics            Age: 82Y            Gender: Female    Admission Date: 08/21/2018 6:06:19 PM  Diagnosis: CVA  Comorbidities:    VITAL SIGNS  Blood Pressure: 121/73 mmHg  Temperature:  degrees  Pulse:  beats per minute  Respirations:  breaths per minute  Pain:    WEIGHT and NUTRITION  Admission Weight: 214.5 pounds; Current Weight: 214.5pounds  Weight Change since Admit: Patient has had no weight change since admission.  Food Consistency: Mechanical Soft  Liquid Consistency:Thin    Plan Of Care  Anticipated Discharge Date/Estimated Length of Stay: 08/28/2018  Anticipated Discharge Destination: Community discharge with assistance  Fall Risk Level: Yellow (Medium)  Case Management: Arlie Solomons, CM  Nurse: Christain Sacramento, RN  Occupational Therapy: Ila Mcgill, OT  Physical Therapy: Delsa Grana, PT  Physician: Hendricks Limes, MD  Speech Language Pathology: Lyndle Herrlich, SLP    The following is a list of patient problems that have been identified by the  interdisciplinary team:    Problem: Impaired Cognition  Team Identified Barrier to Discharge: Yes  Interventions:  Decrease environmental stimuli: Active  Safety awareness training: Active  Compensatory strategies: Active  Cognition: Primary Team Goal/Status: Patient will utilize compensatory cognitive  strategies as needed in order to complete routine personal/household management  tasks (meal planning, scheduling appointments, etc.) with 80% accuracy provided  min A. / Active    Problem: Impaired Mobility  Mobility: Primary Team Goal/Status: Patient will follow the falls protocol (i.e.  calling for assistance prior to attempting to transfer). /    Problem: Impaired Self-care Mgmt/ADL/IADL  Self Care/ADL/IADL Status  Update: set-up/SPV for total body dressing; CG toilet  transfer and toileting; SPV bathing at sinkside  Team Identified Barrier to Discharge: Yes  Interventions:  Adaptive equipment training: Active  Compensatory strategies: Active  Encourage patient to participate in activities of daily living: Active  Self Care: Primary Team Goal/Status: Pt will complete total body dressing  including item retrieval at a mod I level using LRAD / Active    Self Care Functional Status  Eating:  6 - Independent without devices  Oral Hygiene:  5 - Setup or clean-up assistance  Toileting:  4 - Supervision or touching assistance  Shower Bathe:  4 - Supervision or touching assistance  Lower Body Dressing:  4 - Supervision or touching assistance    Mobility Functional Status  Sit to Lying:  4 - Supervision or touching assistance  Lying to Sitting on Side of Bed:  6 - Independent with devices  Sit to Stand:  4 - Supervision or touching assistance  Chair/Bed-to-Chair Transfer:  4 - Supervision or touching assistance  Toilet Transfer:  4 - Supervision or touching assistance      Comments: None    Signed by: Arlie Solomons, MSW 08/22/2018 2:33:00 PM    Physician CoSigned By: Hendricks Limes 08/23/2018 14:40:58

## 2018-08-23 NOTE — Progress Notes (Signed)
Pt complained of chest pain this shift but complained that the previous tramadol did not work. Pt says she wanted morphine for the chest pain because that has worked for her previously at home. Dr Sharlet Salina was made aware and a one time order of morphine was given to pt. Pt verbalized relief of chest pain after taking morphine.

## 2018-08-23 NOTE — Rehab Progress Note ([facility]) (Signed)
[name]  MRN: [id number]  Account: [id number]  Session Start: 08/23/2018 12:00:00 AM  Session Stop: 08/23/2018 12:00:00 AM    Rehabilitation Nursing  Inpatient Rehabilitation Shift Assessment    Rehab Diagnosis: CVA  Demographics:            Age: 54Y            Gender: Female  Primary Language: English    Date of Onset:  08/18/18  Date of Admission: 08/21/2018 6:06:19 PM    Rehabilitation Precautions Restrictions:   Risk for Fall, seizure precautions, aspiration    Patient Report: "I've chest pain"  Patient/Caregiver Goals:  "To be able to focus and not repeat myself so much"    Wounds/Incisions: No wounds or incisions.    Medication Review: No clinically significant medication issues identified this  shift.    Bowel and Bladder Output:                       Bladder (# only)             Bowel (# only)  Number of Episodes  Continent            2                            0  Incontinent          0                            0    Care Tool  Self-Care Functional Assessment  Eating: 05 - Setup or clean-up assistance:  Helper sets up or cleans up; patient  completes activity. Helper assists only prior to or following the activity.  Assistance Provided:   Setup of assistive/adaptive devices  Assistive Device(s):  None  Oral Hygiene: 05 - Setup or clean-up assistance:  Helper sets up or cleans up;  patient completes activity. Helper assists only prior to or following the  activity.  Assistance Provided:   Setup assistance  Assistive Device(s):   None  Toileting Hygiene: 03 - Partial/moderate assistance:  Helper does less than half  the effort. Helper lifts, holds or supports trunk or limbs but provides less  than half the effort.  Toileting Equipment:   Toilet  Lift/Holding Assistance Provided:   Left lower extremity  Assistive Device(s):   Adaptive device to maintain balance,  wheelchair  Shower/Bathe Self: Not observed.  Upper Body Dressing: Not observed/assessed.  Lower Body Dressing: Not  observed/assessed.  Putting On/Taking Off Footwear: Not observed/assessed.    Mobility Functional Assessment  Roll Left and Right: 05 - Setup or clean-up assistance:  Helper sets up or  cleans up; patient completes activity. Helper assists only prior to or following  the activity.  Assistance Provided:   Setup assistance  Assistive Device(s):   Bed rail  Sit to Lying: 04 - Supervision or touching assistance:  Helper provides verbal  cues and/or touching/steadying and/or contact guard assistance as patient  completes activity.  Assistance Provided:   Stand by assistance, Verbal cueing, prompting, or  instructing, Touching  Assistive Device(s):   Grab bar  Lying to Sitting on Side of Bed: 05 - Setup or clean-up assistance:  Helper sets  up or cleans up; patient completes activity. Helper assists only prior to or  following the activity.  Assistance Provided:   Setup assistance  Assistive Device(s):  Bed rail  Sit to Stand: 03 - Partial/moderate assistance:  Helper does less than half the  effort. Helper lifts, holds or supports trunk or limbs but provides less than  half the effort.  Lift/Holding Assistance Provided:   Left upper extremity, Left lower extremity  Assistive Device(s):   Bed rail  Chair/Bed-to-Chair Transfer: Transfering to and from a bed were  observed/assessed. 03 - Partial/moderate assistance:  Helper does less than half  the effort. Helper lifts, holds or supports trunk or limbs but provides less  than half the effort.  Lift/Holding Assistance Provided:   Left upper extremity, Left lower extremity  Assistive Device(s):   Grab bars  Toilet Transfer: 03 - Partial/moderate assistance:  Helper does less than half  the effort. Helper lifts, holds or supports trunk or limbs but provides less  than half the effort.  Lift/Holding Assistance Provided:   Left upper extremity, Left lower extremity  Assistive Device(s):   Grab bars    Education Provided:    Education Provided: Precautions. Pain management.  Medication options. Side  effects. Safety issues and interventions. Fall protocol. Supervision  requirements. Use of adaptive devices. Medication. Name and dosage.  Administration. Purpose. Side Effects.       Audience: Patient.       Mode: Explanation.       Response: Verbalized understanding.    Long Term Goals: 1. Patient will be able to list 3 risk factors for stroke and  will be able to articulate the meaning and use of the FAST acronym for stroke  identification   2. Patient will know the names, indications, and side effects of all her  medications 100 percent of the time  2 weeks from 08/21/18  Short Term Goals: 1. Patient will be able to articulate the meaning of the FAST  acronym for stroke identification   2. Patient will know the names and indications of her medications 75 percent of  the time  1 week from 08/21/18    PROGRESS TOWARD GOALS: Cardiac.  Chhest pain    Response to intervention(s): Pt expressed decrease of chest pain after taking  PRN mrphine imediate release.    Ongoing Education/Training: Continue to assess pt and follow plan of care.    PLAN:  Rehab Nursing Specific Interventions:  Continue with the current Nursing Plan of Care.    TEAM CARE PLAN  Identified problems from team documentation:  Problem: Impaired Cognition  Cognition: Primary Team Goal: Patient will utilize compensatory cognitive  strategies as needed in order to complete routine personal/household management  tasks (meal planning, scheduling appointments, etc.) with 80% accuracy provided  min A./Active    Problem: Impaired Mobility  Mobility: Primary Team Goal: Pt will be mod I for all bed mobility, transfers,  ambulation 350 feet with LRAD, and supervision for two flights of stairs with  single HR./    Problem: Impaired Self-care Mgmt/ADL/IADL  Self Care: Primary Team Goal: Pt will complete total body dressing including  item retrieval at a mod I level using LRAD/Active    Please review Integrated Patient View Care Plan  Flowsheet for Team identified  Problems, Interventions, and Goals.    Signed by: Everlean Patterson, RN 08/23/2018 3:50:00 AM

## 2018-08-23 NOTE — Rehab Progress Note ([facility]) (Signed)
[name]  MRN: [id number]  Account: [id number]  Session Start: 08/23/2018 12:00:00 AM  Session Stop: 08/23/2018 12:00:00 AM    Rehabilitation Nursing  Inpatient Rehabilitation Shift Assessment    Rehab Diagnosis: CVA  Demographics:            Age: 19Y            Gender: Female  Primary Language: English    Date of Onset:  08/18/18  Date of Admission: 08/21/2018 6:06:19 PM    Rehabilitation Precautions Restrictions:   DM  diet consistent carbs  skin  Risk for Fall, seizure precautions, aspiration    Patient Report: "Hello, nice to meet you, I'm ok"  Patient/Caregiver Goals:  "To be able to focus and not repeat myself so much"    Wounds/Incisions: No wounds or incisions.    Medication Review: No clinically significant medication issues identified this  shift.    Bowel and Bladder Output:                       Bladder (# only)             Bowel (# only)  Number of Episodes  Continent            3                            0  Incontinent          0                            0    Care Tool  Self-Care Functional Assessment    Education Provided:    Education Provided: Pain management. Pain scale. Medication options. Side  effects. Safety. Medication. Name and dosage. Administration. Purpose. Side  Effects.       Audience: Patient.       Mode: Explanation.       Response: Verbalized understanding.    Long Term Goals: 1. Patient will be able to list 3 risk factors for stroke and  will be able to articulate the meaning and use of the FAST acronym for stroke  identification   2. Patient will know the names, indications, and side effects of all her  medications 100 percent of the time  2 weeks from 08/21/18  Short Term Goals: 1. Patient will be able to articulate the meaning of the FAST  acronym for stroke identification   2. Patient will know the names and indications of her medications 75 percent of  the time  1 week from 08/21/18    PROGRESS TOWARD GOALS: Pain management.  Interventions: Provide health teachings  on pain monitoring using numeric scale,  Use of clinical indicators to monitor pain level, Explain medications ordered  for pain management, Evaluate, assess outcomes, and/or effectiveness of pain  management    Response to intervention(s): Patient report less pain today using the pain  scale. Report pain from 6 to 2 after taking pain medication.    Ongoing Education/Training: Encourage patient to ask for pain medication when  needed.     Risk of falls/injuries.  Intervention(s): Use of chair and/or bed alarms, Make sure call light, personal  belongings within the patient?s reach, Recognize, assist, and anticipate basic  needs, Establish and/or assist with regular toileting program, Frequent safety  check, rounding, Use of non-skid footwear, Maintain adequate lighting, Encourage  and/assist with safe use of assistive devices during ambulation    Response to intervention(s): Patient did not have any fall today and ask for  assistance when needed.    Ongoing Education/Training: Encourage and reinforce safety precautions.    PLAN:  Rehab Nursing Specific Interventions:  Continue with the current Nursing Plan of Care.    TEAM CARE PLAN  Identified problems from team documentation:  Problem: Impaired Cognition  Cognition: Primary Team Goal: Patient will utilize compensatory cognitive  strategies as needed in order to complete routine personal/household management  tasks (meal planning, scheduling appointments, etc.) with 80% accuracy provided  min A./Active    Problem: Impaired Mobility  Mobility: Primary Team Goal: Pt will be mod I for all bed mobility, transfers,  ambulation 350 feet with LRAD, and supervision for two flights of stairs with  single HR./    Problem: Impaired Self-care Mgmt/ADL/IADL  Self Care: Primary Team Goal: Pt will complete total body dressing including  item retrieval at a mod I level using LRAD/Active    Please review Integrated Patient View Care Plan Flowsheet for Team identified  Problems,  Interventions, and Goals.    Signed by: Elliot Cousin, RN 08/23/2018 12:02:00 PM

## 2018-08-23 NOTE — Rehab Progress Note ([facility]) (Signed)
[name]  MRN: [id number]  Account: [id number]  Session Start: 08/23/2018 9:00:00 AM  Session Stop: 08/23/2018 10:00:00 AM    Speech Language Pathology  Inpatient Rehabilitation Progress Note - Brief    Rehab Diagnosis: CVA  Demographics:            Age: 30Y            Gender: Female  Rehabilitation Precautions/Restrictions:   DM  diet consistent carbs  skin  Risk for Fall, seizure precautions, aspiration    SUBJECTIVE  Patient Report: "I want to remember appointments and work on communication".  Pain: Patient currently without complaints of pain.    OBJECTIVE    Interventions:       Speech Treatment: Reviewed results of formal cognitive testing completed  yesterday. Pt was receptive to information and generated appropriate goals with  some prompting for specificity. Session focused on memory. Pt reported premorbid  use of calendars, alarms, and lists to support memory of functional information.  She also endorsed relying on her daughter at times for recall. Introduced  patient to 1 internal memory strategy if rehearsing/repeating information. Pt  practiced strategy during a 3 word list recall and order manipulation task. She  recalled words after first presentation in 30% of trials, improving to 100% with  a second repetition and cues to rehearse list at least 3 times. Once list was  retained, pt manipulated order of words from smallest item to largest with 100%  accy. Reviewed functional generalization of strategy, such as to recall task  instructions.  Patient was left seated in chair in his/her room. Handoff to nurse completed.  Chair alarm in place and activated.  Oriented to call bell and placed within reach.  Pain Reassessment: Pain was not reassessed as no pain was reported.    Education Provided:    Education Provided: Cognitive functioning.       Audience: Patient.       Mode: Explanation.  Demonstration.       Response: Verbalized understanding.  Demonstrated skill.  Needs  practice.    ASSESSMENT  Patient with significant improvements in working memory of 3 word lists given  repetition (at least twice) of stimuli then rehearsing up to 5 times. Will  benefit from on-going external and internal memory strategy training. Pt also  reports decline in motor speech skills s/p CVA, however, maintained 100%  intelligibility across session.    PLAN  Continued Speech Language Pathology is recommended to address:  Recommended Frequency/Duration/Intensity: 60-120 minutes a day, 5-6 days/week,  x7-10 days  Continued Activities Contributing Toward Care Plan: cognitive retraining,  compensatory cognitive strategy training, family education/training, dysphagia  management, collaborative d/c planning    3 Hour Rule Minutes: 60 minutes of SLP treatment this session count towards  intensity and duration of therapy requirement. Patient was seen for the full  scheduled time of SLP treatment this session.  Therapy Mode Minutes: Individual: 60 minutes.    Signed by: Lyndle Herrlich, M.Ed., CCC-SLP 08/23/2018 10:00:00 AM

## 2018-08-24 LAB — MRSA CULTURE
Culture MRSA Surveillance: NEGATIVE
Culture MRSA Surveillance: NEGATIVE

## 2018-08-24 MED ORDER — LIDOCAINE 5 % EX PTCH
1.00 | MEDICATED_PATCH | CUTANEOUS | Status: DC
Start: 2018-08-24 — End: 2018-08-29
  Administered 2018-08-24 – 2018-08-28 (×5): 1 via TRANSDERMAL
  Filled 2018-08-24 (×5): qty 1

## 2018-08-24 MED ORDER — LURASIDONE HCL 40 MG PO TABS
40.00 mg | ORAL_TABLET | Freq: Every evening | ORAL | Status: DC
Start: 2018-08-24 — End: 2018-08-29
  Administered 2018-08-24 – 2018-08-28 (×5): 40 mg via ORAL

## 2018-08-24 MED ORDER — LIDOCAINE 5 % EX PTCH
1.00 | MEDICATED_PATCH | CUTANEOUS | Status: DC
Start: 2018-08-24 — End: 2018-08-24

## 2018-08-24 NOTE — Rehab Evaluation ([facility]) (Signed)
[name]  MRN: [id number]  Account: [id number]  Session Start: 08/24/2018 2:00:00 PM  Session Stop: 08/24/2018 3:00:00 PM    Therapeutic Recreation  Inpatient Rehabilitation Initial Assessment Contact Note    Orders received, chart reviewed.    Therapeutic Recreation services are not recommended for the following reason(s):  The patient is expected to be a short length of stay (5-7 days).   Pt will benefit from community outing with TR    Signed by: Roselyn Bering, CTRS 08/24/2018 3:00:00 PM

## 2018-08-24 NOTE — Rehab Progress Note ([facility]) (Signed)
[name]  MRN: [id number]  Account: [id number]  Session Start: 08/24/2018 1:00:00 PM  Session Stop: 08/24/2018 2:00:00 PM    Physical Therapy  Inpatient Rehabilitation Treatment Note    Rehab Diagnosis: CVA  Demographics:            Age: 80Y            Gender: Female  Rehabilitation Precautions/Restrictions:   DM  diet consistent carbs  skin  Risk for Fall, seizure precautions, aspiration    OBJECTIVE  Vital Signs:                       Before Activity              After Activity  Vitals  BP Systolic          115                          109  BP Diastolic         74                           72  Pulse                78                           99    Interventions:    Patient was returned to or left in bed. Handoff to nurse completed.  Bed alarm in place and activated.  Oriented to call bell and placed within reach.  Personal items within reach.  Assistive devices positioned out of reach.    ASSESSMENT  Pt progressing with stair training, able to complete 2 x 10. Pt would benefit  from continued skilled therapy to increase LLE strength and overall balance and  endurance to improve functional mobility.    3 Hour Rule Minutes: 60 minutes of PT treatment this session count towards  intensity and duration of therapy requirement. Patient was seen for the full  scheduled time of PT treatment this session.  Therapy Mode Minutes: Individual: 60 minutes.    Signed by: Alberteen Spindle, PTA 08/24/2018 2:00:00 PM

## 2018-08-24 NOTE — Rehab PSY Consult ([facility]) (Signed)
NAMELISEL [name]  MRN: [id number]  Account: [id number]  Session Start: 08/24/2018 2:00:00 PM  Session Stop: 08/24/2018 3:00:00 PM    Psychology Services  Inpatient Rehabilitation Consultation    Rehab Diagnosis: CVA  Demographics:            Age: 62Y            Gender: Female    Past Medical History: A/Fib  HTN  HLD  DM  Migraine  Seizure  Obesity  Syncope    History of Present Illness: Per medical record review, "[[name] is a] 62  YO F admitted with headache and left side weakness, slurred speech, chills and  blurry vision.CT Negative could not do MRI r/t pt with loop recorder.  Neurologist said most likely CVA. Patient with HX of seizures last one 1.5  months ago. Obese, DM which is controlled and HTn that is controlled. Patient is  on aspiration precautions on a Mechanical chopped diet with thin liquids and is  working with PT and OT. Patient was I prior to admission and is now below her  baseline, would benefit from inpatient AR prior to going home with family."            Date of Onset: 08/18/18            Date of Admission: 08/21/2018 6:06:19 PM    Medications and Allergies: Significant rehabilitation considerations:   Please see Epic  Rehabilitation Precautions/Restrictions:   DM  diet consistent carbs  skin  Risk for Fall, seizure precautions, aspiration      Psychiatric History: Patient reported a longstanding psychiatric history with at  4 previous in-patient psychiatric hospitalizations; last admission was /R/60  years ago while living in [place]. Patient denied any history of suicide  attempts, but did admit to a history of suicidal ideation.    Historically patient reported being diagnosed with Bipolar Disorder with  psychotic features. Prior to her most recent admission, the patient had been  followed by an outpatient psychiatrist and psychotherapist in the [place], [place]  area. Per patient, mood was stable with her home psychotropic medication and  quarterly outpatient psychotherapy  sessions. No report of any active psychotic  or manic episodes in over a year.    Behavioral Observations and Mental Status:  Patient seen for initial  psychological consultation to assess for adjustment to medical situation and  rehabilitation hospitalization in accordance with the attending physician?s  initial plan of care. [name] was awake, alert, and cooperative throughout the  session. She presented sitting upright in her hospital wheelchair resting  comfortably. Affect was blunted over the course of the evaluation. Eye contact  was good. Thought process was linear and focused; content was appropriate.  Speech was of normal volume, rate, and prosody.  Processing speed was within  normal limits. There were no behavioral signs of pain during the assessment.  Patient was oriented to person, place, time, and situation. Mood was mildly  irritable. There was no evidence of response to auditory/visual stimuli or  apparent psychosis. Patient denies any suicidal ideation, intent, or plan.    Interdisciplinary Educational Needs and Learning Preferences:       Learning Preference: The patient's preferred learning method is:  Explanation.       Barriers to Learning: No barriers.       Learning Needs:  Stroke prevention education.    Education Provided: No education provided this session.    Interventions:   NPSY HLTH ASSESS  INI EA 15 MIN    ASSESSMENT  Impressions:  Patient?s mood is stable overall; however, there is some increased  concern about ongoing chest pain and what her level of functional independence  will be upon discharge. Specifically, patient positively endorses frustration  about her poor pain management for her intermittent chest pain. She denies  depressive symptoms; however, acknowledges some intermittent episodes of "low"  mood. There is no evidence of a post-stroke depression. The patient does not  appear to be experiencing any panic-like symptoms or an anxiety disorder. Ms.  [name] as a longstanding  history of Bipolar Disorder (with psychotic features-  auditory and visual hallucinations) which had and continues to be well managed  by her established psychotropic medication regimen. There is no report of any  significant change in her mood symptoms since admission.  The patient does not  appear to be experiencing an active mania or psychosis at this time. Both sleep  and appetite are reported to be unchanged. Current presentation is suggestive of  a mild adjustment reaction with depressed mood which is largely related to both  reduced functional independence and experienced pain which she associated with  her recent stroke. She expresses a reasonable understanding of her current  medical status and associated limitations. The patient also appears to be well  motivated to participate in her current rehab plan of care and is hopeful about  her potential for recovery.    *Of note, patient reports that her chest pain has been chronic and longstanding  for over 4 years. She shared that it had been managed with benefit from her home  regimen of Tramadol. Further, there is no report of any worsening of intensity  or frequency of chest pain since her admission.  Potential to Benefit: Good family/social support, Good premorbid functional  status, Motivated  Barriers to Progress/Discharge: No potential barriers to progress.    Goals:  Time frame to achieve short term goal(s): 1 week       1. Patient will regularly employ adaptive coping skills for adjusting to  medical illness, reduced function, and rehab hospitalization via maintaining  adherence with her rehab plan of care, and expressing positive expectations for  increasing her overall future functional independence.      PLAN  Psychology services are recommended to address: Emotional adjustment to current  medical status and recent TIA.    Recommendations:  Individual counseling and co-treatment sessions as needed.    Care Plan  Identified problems from team  documentation:  Problem: Impaired Cognition  Cognition: Primary Team Goal: Patient will utilize compensatory cognitive  strategies as needed in order to complete routine personal/household management  tasks (meal planning, scheduling appointments, etc.) with 80% accuracy provided  min A./Active    Problem: Impaired Mobility  Mobility: Primary Team Goal: Pt will be mod I for all bed mobility, transfers,  ambulation 350 feet with LRAD, and supervision for two flights of stairs with  single HR./    Problem: Impaired Self-care Mgmt/ADL/IADL  Self Care: Primary Team Goal: Pt will complete total body dressing including  item retrieval at a mod I level using LRAD/Active    Identified problems from this assessment:     Psychosocial : Patient will regularly employ adaptive coping skills for  adjusting to medical illness, reduced function, and rehab hospitalization via  maintaining adherence with her rehab plan of care, and expressing positive  expectations for increasing her overall future functional independence.    Discipline:  Neuropsychology/Psychology  Please review Integrated Patient View Care Plan Flowsheet for Team identified  Problems, Interventions, and Goals.    Signed by: Lamar Blinks, PhD 08/24/2018 2:00:00 PM

## 2018-08-24 NOTE — Rehab Progress Note ([facility]) (Signed)
[name]  MRN: [id number]  Account: [id number]  Session Start: 08/24/2018 10:00:00 AM  Session Stop: 08/24/2018 11:00:00 AM    Speech Language Pathology  Inpatient Rehabilitation Progress Note - Brief    Rehab Diagnosis: CVA  Demographics:            Age: 29Y            Gender: Female  Rehabilitation Precautions/Restrictions:   DM  diet consistent carbs  skin  Risk for Fall, seizure precautions, aspiration    SUBJECTIVE  Patient Report: "I just can't remember the words".  Pain: Patient currently without complaints of pain.    OBJECTIVE    Interventions:       Speech Treatment: Targeted memory and complex attention:    -Memory and Mental Manipulation (4 word sentence scramble): Adequate recall and  manipulation of words in 15/20 trials independently, 17/20 with 1 repetition,  and 20/20 with written rehearsal.    -Word List Retention (Category Inclusion): 40% accy independently, 50% with  repetition of list, 100% with writing down words to support retention.    -Alternating attention: Pt input credit card information on computer system  (mock money management task) with 1 error, which she self-corrected. No cues  required for task.  Patient was handed off to next therapist. Chair alarm in place and activated.  Pain Reassessment: Pain was not reassessed as no pain was reported.    Education Provided:    Education Provided: Plan of care. Home management activities. Cognitive  functioning.       Audience: Patient.       Mode: Explanation.  Demonstration.  Teacher, English as a foreign language provided.       Response: Demonstrated skill.  Needs practice.    ASSESSMENT  Pt benefits from a mix of auditory and visual learning styles/strategies to  support processing and retention of novel information. Improved ability to  recognize which strategies would be most effective for task at hand as session  progressed. Pt provided with prospective memory assignment of recording notes  from diabetes education class, which can be reviewed for  effectiveness of note  taking during tomorrows session.    PLAN  Continued Speech Language Pathology is recommended to address:  Recommended Frequency/Duration/Intensity: 60-120 minutes a day, 5-6 days/week,  x7-10 days  Continued Activities Contributing Toward Care Plan: cognitive retraining,  compensatory cognitive strategy training, family education/training, dysphagia  management, collaborative d/c planning    3 Hour Rule Minutes: 60 minutes of SLP treatment this session count towards  intensity and duration of therapy requirement. Patient was seen for the full  scheduled time of SLP treatment this session.  Therapy Mode Minutes: Individual: 60 minutes.    Signed by: Lyndle Herrlich, M.Ed., CCC-SLP 08/24/2018 11:00:00 AM

## 2018-08-24 NOTE — Rehab PPS CMG ([facility]) (Signed)
NAMESAXON [name]  MRN: [id number]  Account: [id number]  Session Start: 08/23/2018 12:00:00 AM  Session Stop: 08/23/2018 12:00:00 AM    PPS CMG Coordinator  Inpatient Rehabilitation Admission    IRF Admission Date:  08/21/2018      Admission Class: Initial Rehab.  [facility]  Pre-Hospital Living: Home. Pre-Hospital Living  With: (2) Family/Relatives.  Payer Source: Primary: Medicare Fee for Service  Secondary: Not Listed.  Additional Information: None.  Ethnic Group:  Leisure centre manager.  Marital Status:  Marital Status: Divorced.    Impairment Group: 01.1 Left Body Involvement (Right Brain)  Date of Onset of Impairment: 08/18/2018    Etiologic Diagnosis Code(s):   Rank Code      Description  1    I67.9     Cerebrovascular disease, unspecified    Comorbidities:   Rank Code      Description    1    I10.      Essential (primary) hypertension  2    G81.94    Hemiplegia, unspecified affecting left                 nondominant side    Are there any arthritis conditions recorded for Impairment Group, Etiologic  Diagnosis, or Comorbid Conditions that meet all of the regulatory requirements  for IRF classification (in 42 CFR 412.29(b)(2)(x), (xi), and xii))? No    Cognitive Patterns: Brief Interview for Mental Status (BIMS) was conducted.  Repetition of Three Words: Three words  Able to report correct year: Correct  Able to report correct month: Accurate within 5 days  Able to report correct day of the week: Incorrect or no answer  Able to recall "sock": Yes, after cuing  Able to recall "blue": Yes, after cuing  Able to recall "bed": Yes, no cue required    BIMS SUMMARY SCORE: 12 Moderately impaired Patient was able to complete the  Brief Interview for Mental Status    Self Care Admission Performance                       Performance  GG0130. Self Care  Eating               6 - Independent  Oral hygiene         5 - Setup or clean-up assistance  Toileting hygiene    4 - Supervision or touching  assistance  Shower/bathe self    3 - Partial/moderate assistance  Upper body dressing  5 - Setup or clean-up assistance  Lower body dressing  3 - Partial/moderate assistance  Footwear on/off      3 - Partial/moderate assistance  Mobility Admission Performance                       Performance  GG0170. Mobility  Roll left/right      4 - Supervision or touching assistance  Sit to lying         4 - Supervision or touching assistance  Lying to sitting bed 4 - Supervision or touching assistance  Sit to stand         3 - Partial/moderate assistance  Chair/bed transfer   3 - Partial/moderate assistance  Toilet transfer      3 - Partial/moderate assistance  Car transfer         4 - Supervision or touching assistance  Walk 10 feet         3 -  Partial/moderate assistance  Walk 50 ft 2 turns   3 - Partial/moderate assistance  Walk 150 feet        88 - Not attempted due to medical or safety concerns  10 ft uneven surface 3 - Partial/moderate assistance  1 step (curb)        3 - Partial/moderate assistance  4 steps              3 - Partial/moderate assistance  12 steps             88 - Not attempted due to medical or safety concerns  Picking up object    4 - Supervision or touching assistance  Use wheelchair?      No  Care Tool Bowel and Bladder: Bladder Continence: Always continent (no documented  incontinence).  Bowel Continence: Not rated (patient had an ostomy or did not have a bowel  movement for the entire 3 days).    MEDICAL NEEDS  Height on Admission: 65 inches.  Weight on Admission: 214.5 pounds.  Swallowing/Nutritional Status: Modiified food consistency/supervision (patient  requires modified food or liquid consistency and/or needs supervision during  eating for safety).    QUALITY INDICATORS  Skin Conditions: Unhealed Pressure Ulcer/Injuries at Stage 1 or Higher on  Admission:  No.    Active Diagnosis: Comorbidities and Co-existing Conditions:   Diabetes Mellitus  (DM) - e.g., diabetic retinopathy, nephropathy, and  neuropathy).    Medication:  Potential Clinically Significant Medication Issues: No issues found during  review    Signed by: Carolyn Stare, M.S. , OTR/L , PPS Coordinator 08/23/2018 4:00:00  PM

## 2018-08-24 NOTE — Rehab Progress Note ([facility]) (Signed)
[name]  MRN: [id number]  Account: [id number]  Session Start: 08/23/2018 1:00:00 PM  Session Stop: 08/23/2018 2:00:00 PM    Physical Therapy  Inpatient Rehabilitation Progress Note - Brief    Rehab Diagnosis: CVA  Demographics:            Age: 56Y            Gender: Female  Rehabilitation Precautions/Restrictions:   DM  diet consistent carbs  skin  Risk for Fall, seizure precautions, aspiration    SUBJECTIVE    Pain: Patient currently without complaints of pain.    OBJECTIVE  Vital Signs:                       Before Activity              After Activity  Vitals  BP Systolic          114                          132  BP Diastolic         71                           75  Pulse                81                           82    Interventions:       Therapeutic Activities: Pt. received reclined in bed alert and agreeable to  PT session. Pt. ambulated to the 5th floor gym 150' with RW and CGA due to mild  instability with kicking RW with LLE. Pt. engaged in gait training with focus on  increasing self awareness of LLE kicking RW and self initiating correction for  bouts of 150' progressing to close Supv with practice. Pt. trialed SPC with pt.  immediately stating preference for stability of RW. Returned to training with RW  for remainder of session. Pt. reporting feeling weakness and unsteadiness in LLE  and engaged in mini squats for increased quad activation and stability in  stance. Pt. reporting fatigue and given long rest break prior to return to room.  No SOB or chest pain reported. Following rest break, pt. reporting feeling able  to walk back to her room. Following return to room, pt. expressing frustration  regarding desire to have had a w/c with her during her session for when she felt  fatigued. Therapist and pt. formed a collaborative plan that therapist would  provide a w/c follow in all future sessions with pt. agreeable to resolution.  Patient was returned to or left in bed. Handoff to nurse  completed.  Bed alarm in place and activated.  Oriented to call bell and placed within reach.  Personal items within reach.  Assistive devices positioned out of reach.  Bed placed in lowest position.    ASSESSMENT  Pt. demonstrating improved balance and stability throughout session progressing  from Min A to close Supv. She is primarily limited by listing to L within her  walker which results in kicking the outer edge and serves as a tripping hazzard.  With focus on self identifying these occurances and correcting, pt. demo's  improved performance and need for fewer cues from therapist. Will continue to  progress in  prep for d/c next week.    PLAN  Continued Physical Therapy is recommended.  Recommended Frequency/Duration/Intensity: 60-120 min/day, 5-7 days a week, for  5-7 days  Activities Contributing Toward Care Plan: TE, TA, NMRE, gait training, assistive  device training, family training, DME education/procurement, individualized HEP,  discharge planning    3 Hour Rule Minutes: 60 minutes of PT treatment this session count towards  intensity and duration of therapy requirement. Patient was seen for the full  scheduled time of PT treatment this session.  Therapy Mode Minutes: Individual: 60 minutes.    Signed by: Truddie Crumble, PT, DPT 08/23/2018 2:00:00 PM

## 2018-08-24 NOTE — Rehab Progress Note ([facility]) (Signed)
[name]  MRN: [id number]  Account: [id number]  Session Start: 08/24/2018 12:00:00 AM  Session Stop: 08/24/2018 12:00:00 AM    Rehabilitation Nursing  Inpatient Rehabilitation Shift Assessment    Rehab Diagnosis: CVA  Demographics:            Age: 48Y            Gender: Female  Primary Language: English    Date of Onset:  08/18/18  Date of Admission: 08/21/2018 6:06:19 PM    Rehabilitation Precautions Restrictions:   DM  diet consistent carbs  skin  Risk for Fall, seizure precautions, aspiration    Patient Report: I'm doing alright  Patient/Caregiver Goals:  "To be able to focus and not repeat myself so much"    Wounds/Incisions: No wounds or incisions.    Medication Review: No clinically significant medication issues identified this  shift.    Bowel and Bladder Output:                       Bladder (# only)             Bowel (# only)  Number of Episodes  Continent            2                            1  Incontinent          0                            0    C    Education Provided:    Education Provided: Precautions. Medication.       Audience: Patient.       Mode: Explanation.       Response: Verbalized understanding.    Long Term Goals: 1. Patient will be able to list 3 risk factors for stroke and  will be able to articulate the meaning and use of the FAST acronym for stroke  identification   2. Patient will know the names, indications, and side effects of all her  medications 100 percent of the time  2 weeks from 08/21/18  Short Term Goals: 1. Patient will be able to articulate the meaning of the FAST  acronym for stroke identification   2. Patient will know the names and indications of her medications 75 percent of  the time  1 week from 08/21/18    PROGRESS TOWARD GOALS: Medication management. Assess patient/caregiver  understanding of medication administration steps, Provide patient/caregiver with  updated medication administration record (M.A.R), Provide printed information  Response to Medication  Management Interventions: we discussed the patient's  current medications and their side effects, she verbalized understanding.  She  expressed to Dr. Timmie Foerster that the latuda she is recieving was recently  increased to 40 mg, so this was adjusted  Focus of Ongoing Education and Training: continue to reinforce medicatoin  teaching until patient can teach back 100 percent consistently    PLAN:  Rehab Nursing Specific Interventions:  Continue with the current Nursing Plan of Care.    TEAM CARE PLAN  Identified problems from team documentation:  Problem: Impaired Cognition  Cognition: Primary Team Goal: Patient will utilize compensatory cognitive  strategies as needed in order to complete routine personal/household management  tasks (meal planning, scheduling appointments, etc.) with 80% accuracy provided  min A./Active  Problem: Impaired Mobility  Mobility: Primary Team Goal: Pt will be mod I for all bed mobility, transfers,  ambulation 350 feet with LRAD, and supervision for two flights of stairs with  single HR./    Problem: Impaired Psychosocial Skills/Behavior  PsychoSocial: Primary Team Goal: Patient will regularly employ adaptive coping  skills for adjusting to medical illness, reduced function, and rehab  hospitalization via maintaining adherence with her rehab plan of care, and  expressing positive expectations for increasing her overall future functional  independence./    Problem: Impaired Self-care Mgmt/ADL/IADL  Self Care: Primary Team Goal: Pt will complete total body dressing including  item retrieval at a mod I level using LRAD/Active    Please review Integrated Patient View Care Plan Flowsheet for Team identified  Problems, Interventions, and Goals.    Signed by: Suanne Marker, RN 08/24/2018 6:56:00 PM

## 2018-08-24 NOTE — Progress Note - Problem Oriented Charting Notewrit (Signed)
[name] today mentioned that the dose she has been receiving of the latuda (20 mg) is less than what she has been taking at home (40 mg).  I attempted to contact her psychiatrist (Dr. Gwen Her with [facility]) to confirm but they are unavailable until Tuesday next week.  I conferred with the patient's daughter [name]) to ask if she could bring in an extra bottle for [place] to confirm the doseage, she was unable to bring it at this time but did confirm unprompted that the latuda had been recently increased to 40 mg once a day.  Informed Dr. Timmie Foerster.

## 2018-08-24 NOTE — Rehab Progress Note ([facility]) (Signed)
[name]  MRN: [id number]  Account: [id number]  Session Start: 08/24/2018 9:00:00 AM  Session Stop: 08/24/2018 10:00:00 AM    Occupational Therapy  Inpatient Rehabilitation Progress Note - Brief    Rehab Diagnosis: CVA  Demographics:            Age: 65Y            Gender: Female  Rehabilitation Precautions/Restrictions:   DM  diet consistent carbs  skin  Risk for Fall, seizure precautions, aspiration    SUBJECTIVE  Patient Report: I want to work on my left leg.  Pain: Patient currently without complaints of pain.    OBJECTIVE  Vital Signs:  bp:  112/74, hr 85, o2 98  bp: 114/75, hr 89, o2 100    Interventions:       Therapeutic Activities:  Pt received in gown.  Pt completed dressing with  supervision.  Pt walked to bathroom with rw with supervision and stood to cut  flowers and place in vase with water.  Pt walked to gym with rw with supervision  with slight ataxic movement noted in lle.  Pt practiced walk in shower transfer  and shower chair transfer with supervision. Pt  completed variety of  squats/exercises to strengthen LLE.  Patient was left seated in chair in his/her room. Handoff to nurse completed.  Chair alarm in place and activated.  Oriented to call bell and placed within reach.  Personal items within reach.  Pain Reassessment: Pain was not reassessed as no pain was reported.    Education Provided:    Education Provided: Personal assistant.       Audience: Patient.       Mode: Explanation.  Demonstration.       Response: Needs practice.    ASSESSMENT  Pt doing well.  Pt would benefit from continued ot to address LLE  weakness/ataxic movement in walking.  Pt does well when standing.        PLAN  Continued Occupational Therapy is recommended.  Recommended Frequency/Duration/Intensity: 60-120 minutes per day; 5-6x a week;  one on one and group therapy as appropriate  Continued Activities Contributing Toward Care Plan: NMRE, ADL retraining,  transfer training, therapeutic activities,  therpeutic exercise, DME assessment,  IADL retraining, stroke education and d/c planning    3 Hour Rule Minutes: 60 minutes of OT treatment this session count towards  intensity and duration of therapy requirement. Patient was seen for the full  scheduled time of OT treatment this session.  Therapy Mode Minutes: Individual: 60 minutes.    Signed by: Ila Mcgill, OTR/L 08/24/2018 10:00:00 AM

## 2018-08-24 NOTE — Rehab Progress Note ([facility]) (Signed)
NAMEVALENA [name]  MRN: [id number]  Account: [id number]  Session Start: 08/24/2018 12:00:00 AM  Session Stop: 08/24/2018 12:00:00 AM    Rehabilitation Nursing  Inpatient Rehabilitation Shift Assessment    Rehab Diagnosis: CVA  Demographics:            Age: 58Y            Gender: Female  Primary Language: English    Date of Onset:  08/18/18  Date of Admission: 08/21/2018 6:06:19 PM    Rehabilitation Precautions Restrictions:   DM  diet consistent carbs  skin  Risk for Fall, seizure precautions, aspiration    Patient Report: "I had a good day with my nurse"  Patient/Caregiver Goals:  "To be able to focus and not repeat myself so much"    Wounds/Incisions: No wounds or incisions.    Medication Review: No clinically significant medication issues identified this  shift.    Bowel and Bladder Output:                       Bladder (# only)             Bowel (# only)  Number of Episodes  Continent            3                            0  Incontinent          0                            0        Education Provided:    Education Provided: Precautions. Pain management. Pain scale. Medication  options. Safety issues and interventions. Fall protocol. Supervision  requirements. Use of adaptive devices. Medication. Name and dosage.  Administration. Purpose. Side Effects.       Audience: Patient.       Mode: Explanation.       Response: Applied knowledge.  Verbalized understanding.    Long Term Goals: 1. Patient will be able to list 3 risk factors for stroke and  will be able to articulate the meaning and use of the FAST acronym for stroke  identification   2. Patient will know the names, indications, and side effects of all her  medications 100 percent of the time  2 weeks from 08/21/18  Short Term Goals: 1. Patient will be able to articulate the meaning of the FAST  acronym for stroke identification   2. Patient will know the names and indications of her medications 75 percent of  the time  1 week from 08/21/18    PROGRESS TOWARD  GOALS: Medication management. Provide patient/caregiver with  updated medication administration record (M.A.R), Evaluate teach-back of  medication administration by patient/caregiver  Response to Medication Management Interventions: Patient was ale to state some  of her medications and the purpose.  Focus of Ongoing Education and Training: Continue to  teach patient on her meds  with purpose and side effect of the medicarions.    PLAN:  Rehab Nursing Specific Interventions:  Continue with the current Nursing Plan of Care.    TEAM CARE PLAN  Identified problems from team documentation:  Problem: Impaired Cognition  Cognition: Primary Team Goal: Patient will utilize compensatory cognitive  strategies as needed in order to complete routine personal/household management  tasks (meal planning, scheduling appointments,  etc.) with 80% accuracy provided  min A./Active    Problem: Impaired Mobility  Mobility: Primary Team Goal: Pt will be mod I for all bed mobility, transfers,  ambulation 350 feet with LRAD, and supervision for two flights of stairs with  single HR./    Problem: Impaired Self-care Mgmt/ADL/IADL  Self Care: Primary Team Goal: Pt will complete total body dressing including  item retrieval at a mod I level using LRAD/Active    Please review Integrated Patient View Care Plan Flowsheet for Team identified  Problems, Interventions, and Goals.    Signed by: Everlean Patterson, RN 08/24/2018 1:33:00 AM

## 2018-08-24 NOTE — Progress Notes (Signed)
[facility] Physiatry Attending Face to Face Progress Note    Date Time: 08/24/18 5:52 PM  Patient Name: [name],[name]    Admission date:  08/21/2018    Functional Status/Update:     [x] I I reviewed patient's therapy notes to assess functional status and ongoing need for therapies.   Of note:  Pt progressing with stair training, able to complete 2 x 10   Pt completed dressing with  supervision.  Pt walked to bathroom with rw with supervision and stood to cut  flowers and place in vase with water.  Pt walked to gym with rw with supervision  with slight ataxic movement noted in lle.  Pt practiced walk in shower transfer  and shower chair transfer with supervision.      Subjective and interval development:   Patient denies headache, dizziness or lightheadedness, she had bowel movement yesterday.  Per patient and her daughter, [name] [name] dose was relatively recently increased to 40 mg by Dr. Gwen Her with [facility], patient psychiatrist.  Patient continues to complain of discomfort in the left thoracic cage aggravated by movement.  Chest x-ray showed no acute disease; troponin- negative; the patient is on telemetry    I rounded with a RN    Review of Systems:   A comprehensive review of systems was: No fevers, chills, vomiting,  shortness of breath, cough, headache, double vision    Objective:     Intake and Output Summary (Last 24 hours) at Date Time  No intake or output data in the 24 hours ending 08/24/18 1752  P.O.: 240 mL (08/23/18 1300)                   Vitals:    08/24/18 0944 08/24/18 1306 08/24/18 1345 08/24/18 1641   BP: 114/75 115/74 109/72 113/67   Pulse: 88 78 99 82   Resp:    18   Temp:    98.6 F (37 C)   TempSrc:    Oral   SpO2: 99%  93% 98%   Weight:       Height:         Physical Examination:  General appearance: Appears well.  In no acute distress.  On telemetry  WU:JWJXB rate and rhythm are regular. No murmurs/rubs/gallops.  Pulm:No wheezes, rales, or rhonchi.  Clear breath sounds  bilaterally  JYN:WGNF. Non-tender. Hypoactive bowel sounds.  Neuro: Awake, anxious, fluent speech  Face is symmetric and without weakness.   Hearing intact bilaterally to conversational speech.   IMusculoskeletal:   ROM-within functional limits      Manual muscle strength testing:  Muscle group Left Right   Shoulder abductors 3 5   Elbow flexors 4 5   Elbow extensors 4 5   Finger flexors 5 5   Hip flexors 4 4   Knee flexors 4 5   Knee extensors 4 5   Ankle dorsiflexors 4 5   Ankle plantarflexors 5 5   EHL 4 5     Extremities: no calf tenderness        New Labs:   No results for input(s): GLUCOSEWHOLE in the last 24 hours.      Labs:   No results for input(s): GLUCOSEWHOLE in the last 24 hours.    Recent Labs   Lab 08/21/18  2032   WBC 6.12   Hgb 9.8*   Hematocrit 30.9*   Platelets 297        Recent Labs   Lab 08/22/18  0319  Sodium 140   Potassium 4.2   Chloride 105   CO2 25   BUN 9   Creatinine 0.9   Calcium 9.1   Albumin 3.2*   Protein, Total 6.1   Bilirubin, Total 0.2   Alkaline Phosphatase 75   ALT 9   AST (SGOT) 12   Glucose 111*   Magnesium 1.9   Phosphorus 4.3   Troponin less than 0.01              Rads:     Rads:   Radiological Procedure reviewed.    Radiology: all results from this admission  Xr Chest 2 Views    Result Date: 08/22/2018   No active disease Kinnie Feil, MD 08/22/2018 4:30 PM    Mri Brain W Wo Contrast    Result Date: 08/22/2018  1.  Diffusion weighted imaging does not show definite evidence of acute or subacute infarct. 2.  Foci of hyperintense T2 and FLAIR signal are seen in the white matter of the cerebral hemispheres. This is a nonspecific finding. It may be related to chronic ischemic change from small vessel disease. 3.  No abnormal enhancing lesions are seen in the brain following contrast administration. Tana Felts, MD 08/22/2018 11:59 PM    Current medications:     Scheduled Meds: PRN Meds:    atorvastatin, 40 mg, Oral, QHS  dabigatran, 150 mg, Oral, Q12H SCH  fluticasone  furoate-vilanterol, 1 puff, Inhalation, QAM  furosemide, 20 mg, Oral, Daily  lamoTRIgine, 150 mg, Oral, BID  Lurasidone HCl, 20 mg, Oral, QHS  metoprolol succinate XL, 25 mg, Oral, Daily  pantoprazole, 40 mg, Oral, BID AC  psyllium, 1 packet, Oral, Daily  QUEtiapine, 50 mg, Oral, QHS  rOPINIRole, 1 mg, Oral, QHS        Continuous Infusions:   acetaminophen, 650 mg, Q4H PRN  albuterol, 2.5 mg, Q6H PRN  bisacodyl, 10 mg, Daily PRN  butalbital-acetaminophen-caffeine, 1 tablet, Q6H PRN  lactulose, 20 g, BID PRN  ondansetron, 4 mg, Q6H PRN  traMADol, 50 mg, BID PRN          Assessment:   62 y.o. 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     Plan:  REHAB: Continue comprehensive and intensive inpatient rehab program, including  Physical therapy 60-120 min daily, 5-6 times per week   Occupational therapy 60-120 min daily, 5-6 times per week   Speech therapy 60-120 min daily, 5-6 times per week   Case management   Rehabilitation nursing    Will continue to address the following impairments and issues:     Constipation: Continue Metamucil, Dulcolax as needed, continue lactulose as needed    Headaches: Fioricet as needed     HTN monitoring /management:  Stable, SBP-109-115, continue Lasix, Toprol-XL    History of bipolar disorder with psychotic features: Per patient and her daughter request, will change Latuda  to 40 mg; it is current home dose    Myofascial pain: We will start trial of Lidoderm    Paroxysmal A. fib: Continue Pradaxa for stroke prevention    Prevention of skin breakdown: Frequent repositioning  Pain Follow-up : tylenol PRN  DVT ppx: Pradaxa  Dyslipidemia:diet,statin        Medication Review:  1. A drug regimen review was completed: Yes  2. Were any drug issues found during review?: No  Coordination of care:  Care coordinated with  Rehab nurse and Dr. Georgetta Haber , internal medicine        Discharge planning: Home  11/6    Signed by: Earl Many DO     [facility]      This note was generated by the Dragon speech recognition system and may contain inherent errors or omissions not intended by the user. Grammatical errors, random word insertions, deletions and pronoun errors  are occasional consequences of this technology due to software limitations. Not all errors are caught or corrected. If there are questions or concerns about the content of this note or information contained within the body of this dictation they should be addressed directly with the author for clarification.

## 2018-08-25 DIAGNOSIS — I10 Essential (primary) hypertension: Secondary | ICD-10-CM

## 2018-08-25 MED ORDER — DIPHENHYDRAMINE HCL 25 MG PO CAPS
25.00 mg | ORAL_CAPSULE | Freq: Four times a day (QID) | ORAL | Status: DC | PRN
Start: 2018-08-25 — End: 2018-08-29
  Administered 2018-08-25 – 2018-08-29 (×7): 25 mg via ORAL
  Filled 2018-08-25 (×7): qty 1

## 2018-08-25 NOTE — Rehab Progress Note ([facility]) (Signed)
[name]  MRN: [id number]  Account: [id number]  Session Start: 08/25/2018 10:00:00 AM  Session Stop: 08/25/2018 11:00:00 AM    Speech Language Pathology  Inpatient Rehabilitation Progress Note - Brief    Rehab Diagnosis: CVA  Demographics:            Age: 50Y            Gender: Female  Rehabilitation Precautions/Restrictions:   DM  diet consistent carbs  skin  Risk for Fall, seizure precautions, aspiration    SUBJECTIVE  Patient Report: "I was going to take some notes but she went totally by the  book" re: diabetes class  Pain: Patient currently without complaints of pain.    OBJECTIVE    Interventions:       Speech Treatment: Reviewed booklet from diabetes class, taking notes from  booklet. Min cues to ID important info.  Great question generation based on  booklet review. Pt recalled general points from diabetes class she previously  attended but was unable to provide details.  Pt stated that the presenter in the  class did not stray away from booklet resulting in no need to take notes.    Memory:  Mental manipulation (scrambled sentence): 100% with x1 rep for S with recall.  Mental manipulation (alphabetical order): 90% inc to 100% with x1 rep for S with  recall.    Patient was left seated in chair in his/her room. Handoff to nurse completed.  Oriented to call bell and placed within reach.  Personal items within reach.  Pain Reassessment: Pain was not reassessed as no pain was reported.    Education Provided:    Education Provided: Diabetes .       Audience: Patient.       Mode: Explanation.  Teacher, English as a foreign language provided.       Response: Verbalized understanding.    ASSESSMENT  Pt. continues to require support to implement strategies such as note taking.  Pt. verbalizing using a calendar before to track appointments and would be  interested in using a similar system.  Pt. demo good understanding of diabetes  ed and asked appropriate questions re: diabetes management    PLAN  Continued Speech Language Pathology  is recommended to address:  Recommended Frequency/Duration/Intensity: 60-120 minutes a day, 5-6 days/week,  x7-10 days  Continued Activities Contributing Toward Care Plan: cognitive retraining,  compensatory cognitive strategy training, family education/training, dysphagia  management, collaborative d/c planning    3 Hour Rule Minutes: 60 minutes of SLP treatment this session count towards  intensity and duration of therapy requirement. Patient was seen for the full  scheduled time of SLP treatment this session.  Therapy Mode Minutes: Individual: 60 minutes.    Signed by: Tammi Klippel, M.Ed., CCC-SLP 08/25/2018 11:00:00 AM

## 2018-08-25 NOTE — Rehab Progress Note ([facility]ilinks) (Signed)
[name]  MRN: [id number]  Account: [id number]  Session Start: 08/25/2018 1:00:00 PM  Session Stop: 08/25/2018 2:00:00 PM    Physical Therapy  Inpatient Rehabilitation Treatment Note    Rehab Diagnosis: CVA  Demographics:            Age: 71Y            Gender: Female  Rehabilitation Precautions/Restrictions:   DM  diet consistent carbs  skin  Risk for Fall, seizure precautions, aspiration    OBJECTIVE  Vital Signs:                       Before Activity              After Activity  Vitals  BP Systolic          119                          121  BP Diastolic         74                           80  Pulse                77                           80    Interventions:       Therapeutic Activities:  Session this hour focused on LE strengthening and  binder completion. Pt instructed in standing at raised mat, B/L therex 1 x 15:  high march, straight leg lift, extension Seated B/L therex with RED theraband 1  x 15: hamstring curl, LAQ, hip ABD/ADD. Pt transfers SPT SBA chair to mat to  chair to bed. Pt transfers supine to seated SBA with extra time. Remainder of  session spent completing PT goals in binder.  Patient was returned to or left in bed. Handoff to nurse completed.  Bed alarm in place and activated.  Oriented to call bell and placed within reach.  Personal items within reach.  Assistive devices positioned out of reach.    ASSESSMENT  Pt progressing with LE strength but would benefit from continued skilled therapy  to increase this strength that will aid in improved functional activity.    3 Hour Rule Minutes: 60 minutes of PT treatment this session count towards  intensity and duration of therapy requirement. Patient was seen for the full  scheduled time of PT treatment this session.  Therapy Mode Minutes: Individual: 60 minutes.    Signed by: Alberteen Spindle, PTA 08/25/2018 2:00:00 PM

## 2018-08-25 NOTE — Rehab Progress Note ([facility]) (Signed)
[name]  MRN: [id number]  Account: [id number]  Session Start: 08/25/2018 11:00:00 AM  Session Stop: 08/25/2018 12:00:00 PM    Occupational Therapy  Inpatient Rehabilitation Progress Note - Brief    Rehab Diagnosis: CVA  Demographics:            Age: 14Y            Gender: Female  Rehabilitation Precautions/Restrictions:   DM  diet consistent carbs  skin  Risk for Fall, seizure precautions, aspiration    SUBJECTIVE  Patient Report: Pt received sititng in w/c in her room. Pt was agreeable to OT  session. Pt stated she wanted to use the toilet.  Pain: Patient currently has pain.  Patient reports a pain level of 4 out of 10. Patient medicated. RN was notified  and pt was given pain medication prior to session    OBJECTIVE  Vital Signs:                       Before Activity              After Activity  Vitals  Time                 1126                         1155  Position/Activity    sitting                      sitting  BP Systolic          120                          138  BP Diastolic         80                           81  Pulse                73                           74    Interventions:       Self Care/Home Management:  Pt ambulated from w/c to bathroom using RW with  supervision. Pt completed toileting. See Care tool for details       Therapeutic Activities:  Pt was taken down to gym in w/c. Pt tolerated 5  min of Nu step to improve strength and endurance. Pt then ambulated simulated  curbs in the [place] [address] [place], worked on getting on and off the bus, grocery  activity to promote balance. After taking a break, pt ambulated around the gym  twice using her RW. Pt was taken back to her room. Pt was left resting in bed,  set up to eat her lunch.  Patient was returned to or left in bed. Oriented to call bell and placed within  reach.  Personal items within reach.  Assistive devices positioned out of reach.  Bed placed in lowest position.  Pain Reassessment:  Response to Pain Intervention: pt reported  pain to be same  Post Intervention Pain Quality:  Aching.  Patient Reports Post Intervention Pain Level of: 4 out of 10  Pain Acceptable: Yes    Education Provided:    Education Provided: Plan of care.  Stroke. Signs /T/ Symptoms associated with  stroke       Audience: Patient.       Mode: Explanation.  Demonstration.       Response: Applied knowledge.  Verbalized understanding.  Demonstrated skill.  Needs practice.    ASSESSMENT  Pt tolerated today's session well. Pt continues to progress towards her OT  goals. Pt continues to present with decreased strength and endurance which  impact her ADLs and mobility. Pt continues to benefit from OT intervention. Pt  is a good rehab candidate.    CARE Tool  Self-Care Functional Assessment    Toileting Hygiene: 04 - Supervision or touching assistance:  Helper provides  verbal cues and/or touching/steadying and/or contact guard assistance as patient  completes activity.  Toileting Equipment:   Retail banker Provided:   Stand by Loss adjuster, chartered):   None      Mobility Functional Assessment  Sit to Lying: 04 - Supervision or touching assistance:  Helper provides verbal  cues and/or touching/steadying and/or contact guard assistance as patient  completes activity.  Assistance Provided:   Stand by assistance  Assistive Device(s):   Bed rail  Lying to Sitting on Side of Bed: Not observed.  Sit to Stand: 04 - Supervision or touching assistance:  Helper provides verbal  cues and/or touching/steadying and/or contact guard assistance as patient  completes activity.  Assistance Provided:   Stand by assistance, Contact guard  Assistive Device(s):    rw  Chair/Bed-to-Chair Transfer: Transfer to bed was observed/assessed.   Supervision or touching assistance:  Helper provides verbal cues and/or  touching/steadying and/or contact guard assistance as patient completes  activity.  Assistance Provided:   Stand by assistance, Contact guard  Assistive Device(s):   Walker, Bed  rails  Toilet Transfer: [id number] - Supervision or touching assistance:  Helper provides  verbal cues and/or touching/steadying and/or contact guard assistance as patient  completes activity.  Assistance Provided:   Stand by assistance, Contact guard  Assistive Device(s):   Walker, Grab bars    PLAN  Continued Occupational Therapy is recommended.  Recommended Frequency/Duration/Intensity: 60-120 minutes per day; 5-6x a week;  one on one and group therapy as appropriate  Continued Activities Contributing Toward Care Plan: NMRE, ADL retraining,  transfer training, therapeutic activities, therpeutic exercise, DME assessment,  IADL retraining, stroke education and d/c planning    3 Hour Rule Minutes: 60 minutes of OT treatment this session count towards  intensity and duration of therapy requirement. Patient was seen for the full  scheduled time of OT treatment this session.  Therapy Mode Minutes: Individual: 60 minutes.    Signed by: Elliot Gurney, OT 08/25/2018 12:00:00 PM

## 2018-08-25 NOTE — Rehab Progress Note ([facility]) (Signed)
[name]  MRN: [id number]  Account: [id number]  Session Start: 08/25/2018 12:00:00 AM  Session Stop: 08/25/2018 12:00:00 AM    Rehabilitation Nursing  Inpatient Rehabilitation Shift Assessment    Rehab Diagnosis: CVA  Demographics:            Age: 52Y            Gender: Female  Primary Language: English    Date of Onset:  08/18/18  Date of Admission: 08/21/2018 6:06:19 PM    Rehabilitation Precautions Restrictions:   DM  diet consistent carbs  skin  Risk for Fall, seizure precautions, aspiration    Patient Report: "Hello, I'm ok today"  Patient/Caregiver Goals:  "To be able to focus and not repeat myself so much"    Wounds/Incisions: No wounds or incisions.    Medication Review: No clinically significant medication issues identified this  shift.    Bowel and Bladder Output:                       Bladder (# only)             Bowel (# only)  Number of Episodes  Continent            2                            0  Incontinent          0                            0    Care Tool  Self-Care Functional Assessment    Education Provided:    Education Provided: Pain management. Pain scale. Medication options. Side  effects. Safety. Medication. Name and dosage. Administration. Purpose. Side  Effects.       Audience: Patient.       Mode: Explanation.       Response: Verbalized understanding.    Long Term Goals: 1. Patient will be able to list 3 risk factors for stroke and  will be able to articulate the meaning and use of the FAST acronym for stroke  identification   2. Patient will know the names, indications, and side effects of all her  medications 100 percent of the time  2 weeks from 08/21/18  Short Term Goals: 1. Patient will be able to articulate the meaning of the FAST  acronym for stroke identification   2. Patient will know the names and indications of her medications 75 percent of  the time  1 week from 08/21/18    PROGRESS TOWARD GOALS: Pain management.  Interventions: Provide health teachings on pain  monitoring using numeric scale,  Use of clinical indicators to monitor pain level, Explain medications ordered  for pain management, Evaluate, assess outcomes, and/or effectiveness of pain  management    Response to intervention(s): Patient report less pain today using the pain  scale. report pain from 6 to no pain after taking pain medication.    Ongoing Education/Training: Encourage patient to ask for pain medication when  needed.     Risk of falls/injuries.  Intervention(s): Use of chair and/or bed alarms, Make sure call light, personal  belongings within the patient?s reach, Recognize, assist, and anticipate basic  needs, Establish and/or assist with regular toileting program, Frequent safety  check, rounding, Use of non-skid footwear, Maintain adequate lighting, Encourage  and/assist with  safe use of assistive devices during ambulation    Response to intervention(s): Patient did not have any fall today and ask for  assistance when needed.    Ongoing Education/Training: Encourage and reinforce safety precautions.    PLAN:  Rehab Nursing Specific Interventions:  Continue with the current Nursing Plan of Care.    TEAM CARE PLAN  Identified problems from team documentation:  Problem: Impaired Cognition  Cognition: Primary Team Goal: Patient will utilize compensatory cognitive  strategies as needed in order to complete routine personal/household management  tasks (meal planning, scheduling appointments, etc.) with 80% accuracy provided  min A./Active    Problem: Impaired Mobility  Mobility: Primary Team Goal: Pt will be mod I for all bed mobility, transfers,  ambulation 350 feet with LRAD, and supervision for two flights of stairs with  single HR./    Problem: Impaired Psychosocial Skills/Behavior  PsychoSocial: Primary Team Goal: Patient will regularly employ adaptive coping  skills for adjusting to medical illness, reduced function, and rehab  hospitalization via maintaining adherence with her rehab plan of care,  and  expressing positive expectations for increasing her overall future functional  independence./    Problem: Impaired Self-care Mgmt/ADL/IADL  Self Care: Primary Team Goal: Pt will complete total body dressing including  item retrieval at a mod I level using LRAD/Active    Please review Integrated Patient View Care Plan Flowsheet for Team identified  Problems, Interventions, and Goals.    Signed by: Elliot Cousin, RN 08/25/2018 11:29:00 AM

## 2018-08-25 NOTE — Progress Notes (Signed)
PROGRESS NOTE -- FACE-TO-FACE ENCOUNTER  PHYSICAL MEDICINE AND REHABILITATION      Date Time: 08/25/18 9:51 AM  Patient Name: [name],[name]    Admission date:  08/21/2018      Plan:   Continue PT, OT in acute rehabilitation medical setting.  Continue comprehensive and intensive inpatient rehab program, including:   Physical therapy 60-120 min daily, 5-6 times per week, Occupational therapy 60-120 min daily, 5-6 times per week, Case management and Rehabilitation nursing    Encourage leg elevation for edema control. Changed to looser socks  Benadryl added for pruritis secondary to lamotrigine.  Patient says this happens occasionally  Continue medication regimen    Assessment:   Continue with acute inpatient rehabilitation with close medical supervision for CVA (cerebral vascular accident).     Patient Active Problem List   Diagnosis   . CVA (cerebral vascular accident)   . Chest pain       Medications:   Medications reviewed by me:     Scheduled Meds: PRN Meds:    atorvastatin, 40 mg, Oral, QHS  dabigatran, 150 mg, Oral, Q12H SCH  fluticasone furoate-vilanterol, 1 puff, Inhalation, QAM  furosemide, 20 mg, Oral, Daily  lamoTRIgine, 150 mg, Oral, BID  lidocaine, 1 patch, Transdermal, Q24H  Lurasidone HCl, 40 mg, Oral, QHS  metoprolol succinate XL, 25 mg, Oral, Daily  pantoprazole, 40 mg, Oral, BID AC  psyllium, 1 packet, Oral, Daily  QUEtiapine, 50 mg, Oral, QHS  rOPINIRole, 1 mg, Oral, QHS        Continuous Infusions:   acetaminophen, 650 mg, Q4H PRN  albuterol, 2.5 mg, Q6H PRN  bisacodyl, 10 mg, Daily PRN  butalbital-acetaminophen-caffeine, 1 tablet, Q6H PRN  lactulose, 20 g, BID PRN  ondansetron, 4 mg, Q6H PRN  traMADol, 50 mg, BID PRN            Subjective/Review of Systems:   No new issues overnight.  No new pain.    Physical Exam:     Vitals:    08/24/18 1345 08/24/18 1641 08/25/18 0602 08/25/18 0826   BP: 109/72 113/67 117/74 100/67   Pulse: 99 82 77 83   Resp:  18 18    Temp:  98.6 F (37 C) 98.2 F (36.8 C)     TempSrc:  Oral Oral    SpO2: 93% 98% 98%    Weight:       Height:           Intake and Output Summary (Last 24 hours) at Date Time    Intake/Output Summary (Last 24 hours) at 08/25/2018 0951  Last data filed at 08/24/2018 2100  Gross per 24 hour   Intake 240 ml   Output 0 ml   Net 240 ml     P.O.: 240 mL (08/24/18 2100)     Urine: 0 mL (08/24/18 2100)             General: NAD, resting comfortably, age appropriate  Chest / Lungs:  Symmetric chest rise and fall, non-labored respirations  Heart RRR  Lungs CTAB, no wheezing  Abdomen:  Nondistended  Neuro: Awake, alert    Labs:   No results for input(s): GLUCOSEWHOLE in the last 24 hours.    Recent Labs   Lab 08/21/18  2032   WBC 6.12   Hgb 9.8*   Hematocrit 30.9*   Platelets 297        Recent Labs   Lab 08/22/18  0319   Sodium 140   Potassium 4.2  Chloride 105   CO2 25   BUN 9   Creatinine 0.9   Calcium 9.1   Albumin 3.2*   Protein, Total 6.1   Bilirubin, Total 0.2   Alkaline Phosphatase 75   ALT 9   AST (SGOT) 12   Glucose 111*   Magnesium 1.9   Phosphorus 4.3             Results     Procedure Component Value Units Date/Time    MRSA culture - Nares [[id number]] Collected:  08/23/18 1636    Specimen:  Body Fluid from Nares Updated:  08/24/18 1355     Culture MRSA Surveillance Negative for Methicillin Resistant Staph aureus    MRSA culture - Throat [[id number]] Collected:  08/23/18 1636    Specimen:  Body Fluid from Throat Updated:  08/24/18 1355     Culture MRSA Surveillance Negative for Methicillin Resistant Staph aureus             Rads:   Radiological Procedure reviewed.  Radiology Results (24 Hour)     ** No results found for the last 24 hours. **          Medication Review  A complete drug regimen review was completed: Yes   No drug issues were found within the last 24 hours      Signed by: Clover Mealy DO    [facility]

## 2018-08-25 NOTE — Rehab Progress Note ([facility]) (Signed)
[name]  MRN: [id number]  Account: [id number]  Session Start: 08/25/2018 12:00:00 AM  Session Stop: 08/25/2018 12:00:00 AM    Rehabilitation Nursing  Inpatient Rehabilitation Shift Assessment    Rehab Diagnosis: CVA  Demographics:            Age: 51Y            Gender: Female  Primary Language: English    Date of Onset:  08/18/18  Date of Admission: 08/21/2018 6:06:19 PM    Rehabilitation Precautions Restrictions:   DM  diet consistent carbs  skin  Risk for Fall, seizure precautions, aspiration    Patient Report: ok  Patient/Caregiver Goals:  "To be able to focus and not repeat myself so much"    Wounds/Incisions: No wounds or incisions.    Medication Review: No clinically significant medication issues identified this  shift.    Bowel and Bladder Output:                       Bladder (# only)             Bowel (# only)  Number of Episodes  Continent            2                            0  Incontinent          0                            0      Education Provided:    Education Provided: Safety issues and interventions. Fall protocol. Medication.  Name and dosage. Administration. Purpose.       Audience: Patient.       Mode: Explanation.       Response: Verbalized understanding.  Needs practice.  Needs reinforcement.    Long Term Goals: 1. Patient will be able to list 3 risk factors for stroke and  will be able to articulate the meaning and use of the FAST acronym for stroke  identification   2. Patient will know the names, indications, and side effects of all her  medications 100 percent of the time  2 weeks from 08/21/18  Short Term Goals: 1. Patient will be able to articulate the meaning of the FAST  acronym for stroke identification   2. Patient will know the names and indications of her medications 75 percent of  the time  1 week from 08/21/18    PROGRESS TOWARD GOALS: Pain management.  Interventions: Provide health teachings on pain monitoring using numeric scale,  Use of clinical indicators to  monitor pain level, Explain medications ordered  for pain management, Determine etiology of pain, Evaluate, assess outcomes,  and/or effectiveness of pain management, Assist patient to identify other/or  non- pharmacologic measures to alleviate pain (e.g., positioning, ROM exercises,  splinting, slow stretching exercises), Reinforce the use of alternative therapy,  treatments and/or other pain modalities recommended by MD/PT/OT.    Response to intervention(s): Patient's pain improved after pain medications    Ongoing Education/Training: Continue assessing and reassesing pain, and  evaluating the effectivenss of pain medications.    PLAN:  Rehab Nursing Specific Interventions:  Continue with the current Nursing Plan of Care.    TEAM CARE PLAN  Identified problems from team documentation:  Problem: Impaired Cognition  Cognition: Primary Team  Goal: Patient will utilize compensatory cognitive  strategies as needed in order to complete routine personal/household management  tasks (meal planning, scheduling appointments, etc.) with 80% accuracy provided  min A./Active    Problem: Impaired Mobility  Mobility: Primary Team Goal: Pt will be mod I for all bed mobility, transfers,  ambulation 350 feet with LRAD, and supervision for two flights of stairs with  single HR./    Problem: Impaired Psychosocial Skills/Behavior  PsychoSocial: Primary Team Goal: Patient will regularly employ adaptive coping  skills for adjusting to medical illness, reduced function, and rehab  hospitalization via maintaining adherence with her rehab plan of care, and  expressing positive expectations for increasing her overall future functional  independence./    Problem: Impaired Self-care Mgmt/ADL/IADL  Self Care: Primary Team Goal: Pt will complete total body dressing including  item retrieval at a mod I level using LRAD/Active    Please review Integrated Patient View Care Plan Flowsheet for Team identified  Problems, Interventions, and  Goals.    Signed by: Reina Fuse, RN 08/25/2018 3:19:00 AM

## 2018-08-25 NOTE — Progress Notes (Signed)
MEDICINE PROGRESS NOTE  [facility], DIVISION OF HOSPITALIST MEDICINE   [facility]   [place] Pager: [phone]      Date Time: 08/25/18 10:34 AM  Patient Name: [name]  Attending Physician: Earl Many, DO  Hospital Day: 5  Assessment:     Active Hospital Problems    Diagnosis   . Chest pain   . CVA (cerebral vascular accident)       62 year old female with past medical history significant for hypertension, asthma, hyperlipidemia, diet-controlled diabetes, seizure, who was presented left sided weakness after recent presumed stroke     Plan:   Atypical chest pain  Resolved, non cardiac   No EKG changes  Troponin negative  Discussed with Dr Lucianne Muss her cardiologist on 08/22/18, has had extensive cardiac work up, no need for further testing at this time  Chest xray with no acute findings       Left sided weakness/numbness  Presented with presumed CVA form OSH  Currently on pradaxa  MRI done, no acute evidence of cva  Continue statin  Evaluated by cardiology       Paroxymsmal Afib  Currently in sinus  S/p linq recorder with medtronic              Confirmed with Dr Lucianne Muss office  On pradaxa   Continue metoprolol    Asthma  Not in exacerbation, mild wheeze will request nebulizer treatment  Continue inhalers    Headache  Complaining of mild headache  Fiorcet prn       HLD  Continue statin    HTN  stable  On metoprolol    Diet controlled diabetes    Pysch:  Continue home psych meds    DVT ppx: on pradaxa    Discussed with: patient, staff    Will follow intermittently, please feel free to call with any question or concernts        Subjective     CC: CVA (cerebral vascular accident)    Diet mechanical soft Liquid consistency: Thin; Additional restrictions: Consistent carbohydrate, Cardiac  NUTRITION DISLIKES Frequency: All meals; Preference: No PEAS  HPI/Subjective: No acute events overnight, no chest pain, no sob ,   Review of Systems:   Review of Systems - Negative except as  above in HPI    Physical Exam:     Temp:  [98.2 F (36.8 C)-98.6 F (37 C)] 98.2 F (36.8 C)  Heart Rate:  [77-99] 83  Resp Rate:  [18] 18  BP: (100-117)/(67-74) 100/67    Intake/Output Summary (Last 24 hours) at 08/25/2018 1034  Last data filed at 08/24/2018 2100  Gross per 24 hour   Intake 240 ml   Output 0 ml   Net 240 ml    General: awake, alert ,in no acute distress  Cardiovascular: regular rate and rhythm, no murmurs  Lungs: clear to auscultation bilaterally, no additional sounds  Abdomen: soft, non-tender, non-distended; normoactive bowel sounds  Extremities: no edema  Neurological: Alert and oriented X 3, moves all extremities.            Meds:   Medications were reviewed:  Scheduled Meds:  Current Facility-Administered Medications   Medication Dose Route Frequency   . atorvastatin  40 mg Oral QHS   . dabigatran  150 mg Oral Q12H SCH   . fluticasone furoate-vilanterol  1 puff Inhalation QAM   . furosemide  20 mg Oral Daily   . lamoTRIgine  150 mg Oral BID   .  lidocaine  1 patch Transdermal Q24H   . Lurasidone HCl  40 mg Oral QHS   . metoprolol succinate XL  25 mg Oral Daily   . pantoprazole  40 mg Oral BID AC   . psyllium  1 packet Oral Daily   . QUEtiapine  50 mg Oral QHS   . rOPINIRole  1 mg Oral QHS     Continuous Infusions:  PRN Meds:.acetaminophen, albuterol, bisacodyl, butalbital-acetaminophen-caffeine, lactulose, ondansetron, traMADol  Labs/Radiology:   Imaging personally reviewed, including: all available   No results found.  No results for input(s): GLUCOSEWB in the last 24 hours.  Recent Labs   Lab 08/22/18  0319   Sodium 140   Potassium 4.2   Chloride 105   BUN 9   Creatinine 0.9   EGFR >60.0   Glucose 111*   Calcium 9.1     Recent Labs   Lab 08/21/18  2032   WBC 6.12   Hgb 9.8*   Hematocrit 30.9*   Platelets 297         Recent Labs   Lab 08/22/18  0319   Alkaline Phosphatase 75   Bilirubin, Total 0.2   Bilirubin, Direct 0.1   ALT 9   AST (SGOT) 12         This note was generated by the Epic EMR  system/ Dragon speech recognition and may contain inherent errors or omissions not intended by the user. Grammatical errors, random word insertions, deletions and pronoun errors  are occasional consequences of this technology due to software limitations. Not all errors are caught or corrected. If there are questions or concerns about the content of this note or information contained within the body of this dictation they should be addressed directly with the author for clarification.    Signed by: Carley Hammed, MD

## 2018-08-26 LAB — ECG 12-LEAD
Atrial Rate: 85 {beats}/min
Atrial Rate: 77 {beats}/min
P Axis: 37 degrees
P Axis: 45 degrees
P-R Interval: 148 ms
P-R Interval: 160 ms
Q-T Interval: 402 ms
Q-T Interval: 406 ms
QRS Duration: 88 ms
QRS Duration: 84 ms
QTC Calculation (Bezet): 478 ms
QTC Calculation (Bezet): 459 ms
R Axis: 35 degrees
R Axis: 44 degrees
T Axis: 46 degrees
T Axis: 52 degrees
Ventricular Rate: 85 {beats}/min
Ventricular Rate: 77 {beats}/min

## 2018-08-26 NOTE — Progress Notes (Signed)
PROGRESS NOTE -- FACE-TO-FACE ENCOUNTER  PHYSICAL MEDICINE AND REHABILITATION      Date Time: 08/26/18 7:03 PM  Patient Name: [name]    Admission date:  08/21/2018      Plan:   Continue PT, OT in acute rehabilitation medical setting.  Continue comprehensive and intensive inpatient rehab program, including:   Physical therapy 60-120 min daily, 5-6 times per week, Occupational therapy 60-120 min daily, 5-6 times per week, Case management and Rehabilitation nursing    Encourage leg elevation for edema control. Changed to looser socks  Benadryl added for pruritis secondary to lamotrigine.  Patient says this happens occasionally  Continue medication regimen    Assessment:   Continue with acute inpatient rehabilitation with close medical supervision for CVA (cerebral vascular accident).     Patient Active Problem List   Diagnosis   . CVA (cerebral vascular accident)   . Chest pain       Medications:   Medications reviewed by me:     Scheduled Meds: PRN Meds:    atorvastatin, 40 mg, Oral, QHS  dabigatran, 150 mg, Oral, Q12H SCH  fluticasone furoate-vilanterol, 1 puff, Inhalation, QAM  furosemide, 20 mg, Oral, Daily  lamoTRIgine, 150 mg, Oral, BID  lidocaine, 1 patch, Transdermal, Q24H  Lurasidone HCl, 40 mg, Oral, QHS  metoprolol succinate XL, 25 mg, Oral, Daily  pantoprazole, 40 mg, Oral, BID AC  psyllium, 1 packet, Oral, Daily  QUEtiapine, 50 mg, Oral, QHS  rOPINIRole, 1 mg, Oral, QHS        Continuous Infusions:   acetaminophen, 650 mg, Q4H PRN  albuterol, 2.5 mg, Q6H PRN  bisacodyl, 10 mg, Daily PRN  butalbital-acetaminophen-caffeine, 1 tablet, Q6H PRN  diphenhydrAMINE, 25 mg, Q6H PRN  lactulose, 20 g, BID PRN  ondansetron, 4 mg, Q6H PRN  traMADol, 50 mg, BID PRN            Subjective/Review of Systems:   No new issues overnight.  No new pain. Says family came by to visit    Physical Exam:     Vitals:    08/26/18 0445 08/26/18 0539 08/26/18 0805 08/26/18 1629   BP:  114/64 131/78 112/71   Pulse:  70 88 94   Resp:   18  18   Temp:  98.1 F (36.7 C)  98.6 F (37 C)   TempSrc:  Oral  Oral   SpO2:  97%  97%   Weight: 96.8 kg (213 lb 8 oz)      Height:           Intake and Output Summary (Last 24 hours) at Date Time    Intake/Output Summary (Last 24 hours) at 08/26/2018 1903  Last data filed at 08/26/2018 1700  Gross per 24 hour   Intake 960 ml   Output 0 ml   Net 960 ml     P.O.: 240 mL (08/26/18 1700)     Urine: 0 mL (08/25/18 2000)             General: NAD, resting comfortably, age appropriate  Chest / Lungs:  Symmetric chest rise and fall, non-labored respirations  Heart RRR  Lungs CTAB, no wheezing  Abdomen:  Nondistended  Neuro: Awake, alert    Labs:   No results for input(s): GLUCOSEWHOLE in the last 24 hours.    Recent Labs   Lab 08/21/18  2032   WBC 6.12   Hgb 9.8*   Hematocrit 30.9*   Platelets 297  Recent Labs   Lab 08/22/18  0319   Sodium 140   Potassium 4.2   Chloride 105   CO2 25   BUN 9   Creatinine 0.9   Calcium 9.1   Albumin 3.2*   Protein, Total 6.1   Bilirubin, Total 0.2   Alkaline Phosphatase 75   ALT 9   AST (SGOT) 12   Glucose 111*   Magnesium 1.9   Phosphorus 4.3             Results     ** No results found for the last 24 hours. **             Rads:   Radiological Procedure reviewed.  Radiology Results (24 Hour)     ** No results found for the last 24 hours. **          Medication Review  A complete drug regimen review was completed: Yes   No drug issues were found within the last 24 hours      Signed by: Clover Mealy DO    [facility]

## 2018-08-26 NOTE — Rehab Progress Note ([facility]) (Signed)
[name]  MRN: [id number]  Account: [id number]  Session Start: 08/26/2018 12:00:00 AM  Session Stop: 08/26/2018 12:00:00 AM    Rehabilitation Nursing  Inpatient Rehabilitation Shift Assessment    Rehab Diagnosis: CVA  Demographics:            Age: 36Y            Gender: Female  Primary Language: English    Date of Onset:  08/18/18  Date of Admission: 08/21/2018 6:06:19 PM    Rehabilitation Precautions Restrictions:   DM  diet consistent carbs  skin  Risk for Fall, seizure precautions, aspiration    Patient Report: Nausea  Patient/Caregiver Goals:  "To be able to focus and not repeat myself so much"    Wounds/Incisions: No wounds or incisions.    Medication Review: No clinically significant medication issues identified this  shift.    Bowel and Bladder Output:                       Bladder (# only)             Bowel (# only)  Number of Episodes  Continent            2                            0  Incontinent          0                            0        Education Provided:    Education Provided: Safety issues and interventions. Fall protocol. Medication.  Name and dosage. Administration. Purpose.       Audience: Patient.       Mode: Explanation.       Response: Verbalized understanding.  Needs practice.  Needs reinforcement.    Long Term Goals: 1. Patient will be able to list 3 risk factors for stroke and  will be able to articulate the meaning and use of the FAST acronym for stroke  identification   2. Patient will know the names, indications, and side effects of all her  medications 100 percent of the time  2 weeks from 08/21/18  Short Term Goals: 1. Patient will be able to articulate the meaning of the FAST  acronym for stroke identification   2. Patient will know the names and indications of her medications 75 percent of  the time  1 week from 08/21/18    PROGRESS TOWARD GOALS: Pain management.  Interventions: Provide health teachings on pain monitoring using numeric scale,  Use of clinical indicators to  monitor pain level, Explain medications ordered  for pain management, Determine etiology of pain, Evaluate, assess outcomes,  and/or effectiveness of pain management, Assist patient to identify other/or  non- pharmacologic measures to alleviate pain (e.g., positioning, ROM exercises,  splinting, slow stretching exercises), Reinforce the use of alternative therapy,  treatments and/or other pain modalities recommended by MD/PT/OT.    Response to intervention(s): Pt medicated x1 for pain    Ongoing Education/Training: Continue assessing pain and the effectiveness of  pain medications    PLAN:  Rehab Nursing Specific Interventions:  Continue with the current Nursing Plan of Care.    TEAM CARE PLAN  Identified problems from team documentation:  Problem: Impaired Cognition  Cognition: Primary Team Goal: Patient  will utilize compensatory cognitive  strategies as needed in order to complete routine personal/household management  tasks (meal planning, scheduling appointments, etc.) with 80% accuracy provided  min A./Active    Problem: Impaired Mobility  Mobility: Primary Team Goal: Pt will be mod I for all bed mobility, transfers,  ambulation 350 feet with LRAD, and supervision for two flights of stairs with  single HR./    Problem: Impaired Psychosocial Skills/Behavior  PsychoSocial: Primary Team Goal: Patient will regularly employ adaptive coping  skills for adjusting to medical illness, reduced function, and rehab  hospitalization via maintaining adherence with her rehab plan of care, and  expressing positive expectations for increasing her overall future functional  independence./    Problem: Impaired Self-care Mgmt/ADL/IADL  Self Care: Primary Team Goal: Pt will complete total body dressing including  item retrieval at a mod I level using LRAD/Active    Please review Integrated Patient View Care Plan Flowsheet for Team identified  Problems, Interventions, and Goals.    Signed by: Reina Fuse, RN 08/26/2018 1:04:00  AM

## 2018-08-26 NOTE — Progress Notes (Signed)
Pt had small emesis after coughing. V/s WNL. PRN Zofran given. Pt reported relief. Currently in bed denies Nausea.

## 2018-08-26 NOTE — Rehab Progress Note ([facility]) (Signed)
[name]  MRN: [id number]  Account: [id number]  Session Start: 08/26/2018 12:00:00 AM  Session Stop: 08/26/2018 12:00:00 AM    Rehabilitation Nursing  Inpatient Rehabilitation Shift Assessment    Rehab Diagnosis: CVA  Demographics:            Age: 70Y            Gender: Female  Primary Language: English    Date of Onset:  08/18/18  Date of Admission: 08/21/2018 6:06:19 PM    Rehabilitation Precautions Restrictions:   DM  diet consistent carbs  skin  Risk for Fall, seizure precautions, aspiration    Patient Report: "Hello, I'm feeling ok today"  Patient/Caregiver Goals:  "To be able to focus and not repeat myself so much"    Wounds/Incisions: No wounds or incisions.    Medication Review: No clinically significant medication issues identified this  shift.    Bowel and Bladder Output:                       Bladder (# only)             Bowel (# only)  Number of Episodes  Continent            3                            0  Incontinent          0                            0    Care Tool  Self-Care Functional Assessment    Education Provided:    Education Provided: Pain management. Pain scale. Medication options. Side  effects. Safety. Medication. Name and dosage. Administration. Purpose. Side  Effects.       Audience: Patient.       Mode: Explanation.       Response: Verbalized understanding.    Long Term Goals: 1. Patient will be able to list 3 risk factors for stroke and  will be able to articulate the meaning and use of the FAST acronym for stroke  identification   2. Patient will know the names, indications, and side effects of all her  medications 100 percent of the time  2 weeks from 08/21/18  Short Term Goals: 1. Patient will be able to articulate the meaning of the FAST  acronym for stroke identification   2. Patient will know the names and indications of her medications 75 percent of  the time  1 week from 08/21/18    PROGRESS TOWARD GOALS: Pain management.  Interventions: Provide health teachings on pain  monitoring using numeric scale,  Use of clinical indicators to monitor pain level, Explain medications ordered  for pain management, Evaluate, assess outcomes, and/or effectiveness of pain  management    Response to intervention(s): Patient report less pain today using the pain  scale. Report pain from 6 to no pain after taking pain medication.    Ongoing Education/Training: Encourage patient to ask for pain medication when  needed.     Risk of falls/injuries.  Intervention(s): Use of chair and/or bed alarms, Make sure call light, personal  belongings within the patient?s reach, Recognize, assist, and anticipate basic  needs, Establish and/or assist with regular toileting program, Frequent safety  check, rounding, Use of non-skid footwear, Maintain adequate lighting, Encourage  and/assist  with safe use of assistive devices during ambulation    Response to intervention(s): Patient did not have any fall today and ask for  assistance when needed.    Ongoing Education/Training: Encourage and reinforce safety precaution.    PLAN:  Rehab Nursing Specific Interventions:  Continue with the current Nursing Plan of Care.    TEAM CARE PLAN  Identified problems from team documentation:  Problem: Impaired Cognition  Cognition: Primary Team Goal: Patient will utilize compensatory cognitive  strategies as needed in order to complete routine personal/household management  tasks (meal planning, scheduling appointments, etc.) with 80% accuracy provided  min A./Active    Problem: Impaired Mobility  Mobility: Primary Team Goal: Pt will be mod I for all bed mobility, transfers,  ambulation 350 feet with LRAD, and supervision for two flights of stairs with  single HR./    Problem: Impaired Psychosocial Skills/Behavior  PsychoSocial: Primary Team Goal: Patient will regularly employ adaptive coping  skills for adjusting to medical illness, reduced function, and rehab  hospitalization via maintaining adherence with her rehab plan of care,  and  expressing positive expectations for increasing her overall future functional  independence./    Problem: Impaired Self-care Mgmt/ADL/IADL  Self Care: Primary Team Goal: Pt will complete total body dressing including  item retrieval at a mod I level using LRAD/Active    Please review Integrated Patient View Care Plan Flowsheet for Team identified  Problems, Interventions, and Goals.    Signed by: Elliot Cousin, RN 08/26/2018 12:15:00 PM

## 2018-08-27 MED ORDER — SUCRALFATE 1 G PO TABS
1.00 g | ORAL_TABLET | Freq: Four times a day (QID) | ORAL | Status: DC
Start: 2018-08-27 — End: 2018-08-29
  Administered 2018-08-27 – 2018-08-29 (×7): 1 g via ORAL
  Filled 2018-08-27 (×8): qty 1

## 2018-08-27 NOTE — Rehab Progress Note ([facility]) (Signed)
[name]  MRN: [id number]  Account: [id number]  Session Start: 08/27/2018 12:00:00 AM  Session Stop: 08/27/2018 12:00:00 AM    Rehabilitation Nursing  Inpatient Rehabilitation Shift Assessment    Rehab Diagnosis: CVA  Demographics:            Age: 9Y            Gender: Female  Primary Language: English    Date of Onset:  08/18/18  Date of Admission: 08/21/2018 6:06:19 PM    Rehabilitation Precautions Restrictions:   DM  diet consistent carbs  skin  Risk for Fall, seizure precautions, aspiration    Patient Report: 'When can I have my pain medicine'.  Patient/Caregiver Goals:  "To be able to focus and not repeat myself so much"    Wounds/Incisions: No wounds or incisions.    Medication Review: No clinically significant medication issues identified this  shift.    Bowel and Bladder Output:                       Bladder (# only)             Bowel (# only)  Number of Episodes  Continent            3                            0  Incontinent          0                            0          Education Provided:    Education Provided: Pain management. Pain scale. Medication options. Side  effects. Clinical indicators of pain. Safety issues and interventions. Impaired  coordination. Use of adaptive devices. Bowel and bladder programs. Bladder  training program. Bowel Training program. Stroke. What is stroke, Types of  stroke, Signs /T/ Symptoms associated with stroke, Etiology of stroke,  Anatomy/Physiology of the brain, Stroke prevention, Medication compliance,  Control blood pressure, high cholesterol and/or diabetes, Smoking cessation,  Healthy diet, Increase physical activity, Reduce stress, Limit alcohol intake,  Stroke recovery, Emotional adjustments/depression, Thrombophlebitis (DVT)  Medication. Name and dosage. Administration. Purpose. Side Effects. Interaction.  Labs. Anticoagulants.       Audience: Patient.       Mode: Explanation.  Stroke ed booklet.       Response: Verbalized understanding.  Needs  reinforcement.    Long Term Goals: 1. Patient will be able to list 3 risk factors for stroke and  will be able to articulate the meaning and use of the FAST acronym for stroke  identification   2. Patient will know the names, indications, and side effects of all her  medications 100 percent of the time  2 weeks from 08/21/18  Short Term Goals: 1. Patient will be able to articulate the meaning of the FAST  acronym for stroke identification   2. Patient will know the names and indications of her medications 75 percent of  the time  1 week from 08/21/18    PROGRESS TOWARD GOALS: Bladder management.  Interventions: Assess urination pattern, Determine level of injury, Assess  functional level, Explain medications ordered to maintain bladder function,  Monitor and/or regulate fluid intake; encourage patient and/or family to monitor  I/T/Os, Monitor urine output characteristics, Provide teachings about s/s of  UTI, and measures to prevent infection, Allow patient and/or family to demo  learning and understanding, Evaluate outcome of teaching/training, Provide  health teachings and demo on intermittent catheterization: supplies to be used,  steps to maintain sterile or clean technique, Assist with/provide incontinent  care    Response to intervention(s): Pt is continent of bladder and calls for assistance  to the br .    Ongoing Education/Training: Continue bladder management education/training until  discharge.     Bowel management.  Interventions: Rectal examination, Assess patient?s ability to transfer,  balance, and tolerance, Assess patient?s ability to learn and ability to direct  others, Develop safe, effective and realistic bowel program, Identify equipment  needs, Use of the least noxious stimulant/s to produce effective results, Use of  chemical stimulants such as stool meds, suppository, Assist patient to assume  upright position for (possible) evacuation    Response to intervention(s): Pt verbalized  she   usually goes to the toilet  every day and not constipated.    Ongoing Education/Training: Continue bowel  management  measures above to  prevent constipation while in rehab.     Pain management.  Interventions: Provide health teachings on pain monitoring using numeric scale,  Use of clinical indicators to monitor pain level, Explain medications ordered  for pain management, Determine etiology of pain, Evaluate, assess outcomes,  and/or effectiveness of pain management, Assist patient to identify other/or  non- pharmacologic measures to alleviate pain (e.g., positioning, ROM exercises,  splinting, slow stretching exercises)    Response to intervention(s): Pt verbalized pain relief with as needed tramadol  with good result.    Ongoing Education/Training: Continue pain management  measures  above to reduce  pain while in rehab.     Risk of falls/injuries.  Intervention(s): Fall risk assessment using EBP tool, Use of chair and/or bed  alarms, Make sure call light, personal belongings within the patient?s reach,  Recognize, assist, and anticipate basic needs, Establish and/or assist with  regular toileting program, Provide distraction, direct attention to other  activities, Frequent safety check, rounding, Use of non-skid footwear, Encourage  and/assist with safe use of assistive devices during ambulation    Response to intervention(s): Pt called 100 % for assistance this shift proir to  getting oob to prevent falls    Ongoing Education/Training: Continue safety education  / training to prevent  falls while  in rehab.    PLAN:  Rehab Nursing Specific Interventions:  Branch    TEAM CARE PLAN  Identified problems from team documentation:  Problem: Impaired Cognition  Cognition: Primary Team Goal: Patient will utilize compensatory cognitive  strategies as needed in order to complete routine personal/household management  tasks (meal planning, scheduling appointments, etc.) with 80% accuracy provided  min  A./Active    Problem: Impaired Mobility  Mobility: Primary Team Goal: Pt will be mod I for all bed mobility, transfers,  ambulation 350 feet with LRAD, and supervision for two flights of stairs with  single HR./    Problem: Impaired Psychosocial Skills/Behavior  PsychoSocial: Primary Team Goal: Patient will regularly employ adaptive coping  skills for adjusting to medical illness, reduced function, and rehab  hospitalization via maintaining adherence with her rehab plan of care, and  expressing positive expectations for increasing her overall future functional  independence./    Problem: Impaired Self-care Mgmt/ADL/IADL  Self Care: Primary Team Goal: Pt will complete total body dressing including  item retrieval at a mod I level using LRAD/Not Met    Please review  Integrated Patient View Care Plan Flowsheet for Team identified  Problems, Interventions, and Goals.    Signed by: Lestine Box, RN 08/27/2018 6:00:00 PM

## 2018-08-27 NOTE — Rehab Progress Note ([facility]) (Signed)
[name]  MRN: [id number]  Account: [id number]  Session Start: 08/27/2018 9:00:00 AM  Session Stop: 08/27/2018 10:00:00 AM    Speech Language Pathology  Inpatient Rehabilitation Progress Note - Brief    Rehab Diagnosis: CVA  Demographics:            Age: 53Y            Gender: Female  Rehabilitation Precautions/Restrictions:   DM  diet consistent carbs  skin  Risk for Fall, seizure precautions, aspiration    SUBJECTIVE  Patient Report: "Do you think I need a medication for it?" Re: Memory deficits.  Pain: Patient currently without complaints of pain.    OBJECTIVE    Interventions:       Speech Treatment: Session targeted working memory and processing:    -5 word list retention + categorical exclusion: 80% accy with independent  requests for repetition (up to 3 required). Implemented verbal rehearsal to  support retention of info with min prompts and chunking with mod prompts.    -External aid training for health management: Introduced pt to medication list.  She required access to list to recall names of medications but recall purpose  with 100% accy independently. Receptive to using list at home.    -Functional reading (Recipe, bills): 80% accy at mod-I level, 100% with  re-phrasing of questions to support processing.  Patient was left seated in chair in his/her room. Handoff to nurse completed.  Chair alarm in place and activated.  Oriented to call bell and placed within reach.  Pain Reassessment: Pain was not reassessed as no pain was reported.    Education Provided:    Education Provided: Plan of care. Home exercise/activity plan. Cognitive  functioning.       Audience: Patient.       Mode: Explanation.       Response: Demonstrated skill.  Needs practice.    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 She benefits from a combination  of external and internal memory strategies, such as writing down  information,  rehearsal and chunking. Will benefit from additional practice prior to d/c.    PLAN  Continued Speech Language Pathology is recommended to address:  Recommended Frequency/Duration/Intensity: 60-120 minutes a day, 5-6 days/week,  x7-10 days  Continued Activities Contributing Toward Care Plan: cognitive retraining,  compensatory cognitive strategy training, family education/training, dysphagia  management, collaborative d/c planning    3 Hour Rule Minutes: 60 minutes of SLP treatment this session count towards  intensity and duration of therapy requirement. Patient was seen for the full  scheduled time of SLP treatment this session.  Therapy Mode Minutes: Individual: 60 minutes.    Signed by: Lyndle Herrlich, M.Ed., CCC-SLP 08/27/2018 10:00:00 AM

## 2018-08-27 NOTE — Rehab Discharge Summary ([facility]) (Signed)
NAMEJAKIERA [name]  MRN: [id number]  Account: [id number]  Session Start: 08/27/2018 8:00:00 AM  Session Stop: 08/27/2018 9:00:00 AM    Occupational Therapy  Inpatient Rehabilitation Discharge Summary and Note    Rehab Diagnosis: CVA  Demographics:            Age: 62Y            Gender: Female    Past Medical History: A/Fib  HTN  HLD  DM  Migraine  Seizure  Obesity  Syncope  History of Present Illness: Per medical record review, "[[name] is a] 62  YO F admitted with headache and left side weakness, slurred speech, chills and  blurry vision.  CT Negative could not do MRI r/t pt with loop recorder.  Neurologist said most likely CVA. Patient with HX of seizures last one 1.5  months ago. Obese, DM which is controlled and HTn that is controlled. Patient is  on aspiration precautions on a Mechanical chopped diet with thin liquids and is  working with PT and OT. Patient was I prior to admission and is now below her  baseline, would benefit from inpatient AR prior to going home with family."   Date of Onset: 08/18/18   Date of Admission: 08/21/2018 6:06:19 PM  Rehabilitation Precautions/Restrictions:   DM  diet consistent carbs  skin  Risk for Fall, seizure precautions, aspiration    SUBJECTIVE  Patient Report: "I like trying to use my phone for reminders"  Pain: Patient currently without complaints of pain.    OBJECTIVE  General Observation: Received sitting in w/c, alert and agreeable. Additional OT  present for observation with pt consent  Vitals:                       Before Activity              After Activity  Vitals  Time                 810                          852  Position/Activity    sitting                      sitting  BP Systolic          114                          122  BP Diastolic         74                           76  Pulse                83                           76    Cognition: Pt presents with cognitive deficits within the areas of memory and  executive functioning requiring use of  external aids. She has assistance for all  IADLs at home and would benefit from continued SPV during medication/financial  management to ensure accuracy. See SLP d/c summary for full detail  Perception: WNL    CARE Tool Discharge Performance  Self-Care Functional Assessment  Eating: 06 - Independent:  Patient completed the  activities by him/herself with  no assistance from a helper and without use of assistive devices.  Oral Hygiene: 06 - Independent:  Patient completed the activities by him/herself  with no assistance from a helper and without use of assistive devices.  Toileting Hygiene: 06 - Independent:  Patient completed the activities by  him/herself with no assistance from a helper and without use of assistive  devices.  Toileting Equipment:   Network engineer Self: 04 - Supervision or touching assistance:  Helper provides  verbal cues and/or touching/steadying and/or contact guard assistance as patient  completes activity.  Location:  Shower.  Assistance Provided:   Stand by assistance, General supervision for safety  Assistive Device(s):   Grab bar/arm rest to maintain balance, Hand held shower  Upper Body Dressing: Dressing (putting on clothing) was observed/assessed.   Setup or clean-up assistance:  Helper sets up or cleans up; patient completes  activity. Helper assists only prior to or following the activity.  Assistance Provided:   Setup assistance  Assistive Device(s):   None  Lower Body Dressing: Dressing (putting on clothing) was observed/assessed. Setup  or clean-up assistance:  Helper sets up or cleans up; patient completes  activity. Helper assists only prior to or following the activity.  Assistance Provided:   Setup assistance  Assistive Device(s):   None  Putting On/Taking Off Footwear: Putting on footwear was observed/assessed. Setup  or clean-up assistance:  Helper sets up or cleans up; patient completes  activity. Helper assists only prior to or following the activity.  Assistance  Provided:   Setup assistance  Assistive Device(s):   None    Mobility Functional Assessment  Toilet Transfer: 04 - Supervision or touching assistance:  Helper provides  verbal cues and/or touching/steadying and/or contact guard assistance as patient  completes activity.  Assistance Provided:   Stand by assistance  Assistive Device(s):   Dan Humphreys    Today's Treatment Interventions:       Therapeutic Activities:  Initiated session with completion of total body  dressing with set-up assistance.  Transitioned to therapy gym to address functional cognition, and LUE FMC and  strength in order to increase independence and safety with IADLs.  Engaged in simulated medication management targeting working memory, problem  solving and Advertising account executive. Pt able to independently open all  pill containers using L hand as stabilizer. Filled pill box with 100% accuracy.  Educated pt re memory strategies to ensure accuracy including flipping pill  bottles over once filled and use of her phone to set daily reminders to take her  medication. Pt receptive to ideas.  Engaged in LUE [facility] and strengthening activities including completion of  palm<>finger translation of small beads and completion of theraputty exercises  using green theraputty. Provided pt with HEP for theraputty for future use and  in order to increase LUE functional use during daily tasks.  Returned to room with needs in place  Patient was left seated in chair in his/her room. Handoff to nurse completed.  Chair alarm in place and activated.  Oriented to call bell and placed within reach.  Personal items within reach.  Assistive devices positioned out of reach.    Home Exercise Program: The patient was provided with an individualized home  exercise program.  Equipment Provided/Ordered: Pt currently owns a shower chair and would benefit  from continued use at home to ensure safety within the wet environment    Education Provided:    Education Provided: Plan of  care. Activities  of daily living. Functional  transfers. Home exercise/activity plan. Equipment. Home management activities.       Audience: Patient.       Mode: Explanation.  Demonstration.  Teacher, English as a foreign language provided.       Response: Verbalized understanding.  Demonstrated skill.    ASSESSMENT  Summary of Progress and Current Status: Ms Vanlue has made good progress during  her inpatient rehab stay, achieving a set-up/SPV level for completion of her ADL  routine and navigation of short household distances using the RW. She presented  with mild L hemibody weakness which has been addressed through functional  mobility and therapeutic exercise. She will continue to benefit from use of  environmental adaptations of her bathroom and overall SPV for all IADLs that she  had prior to her admission to ensure safety and accuracy with more complex  activities. She would continue to benefit from skilled OT services within her  home environment to further address independence and safety with her daily  routines and to promote re-engage in meaningful occupations    Long Term Goals (status prior to discharge): Pt will complete total body  dressing including item retrieval at a mod I level using LRAD as needed  Pt will navigate short household distances at a mod I level using LRAD in order  to access her bathroom/kitchen  Pt will demonstrate improved LUE strength and coordination progressing from  functional assist to fully functional assist on the FUEL scale  Pt will demonstrate improved standing balance/tolerance by completing an IADL of  her choice in standing for 10 minutes with SBA  Pt will attend stroke education class and be able to independently identify 3  controllable risk factors in order to decrease future CVAs  1 week from date of initial OT eval  Short Term Goals (status prior to discharge):    Progress Towards Goals (final status):   LONG TERM GOAL REVIEW:       1. Pt will complete total body dressing including item  retrieval at a mod I  level using LRAD as needed - Not Met: set-up assist       2. Pt will navigate short household distances at a mod I level using LRAD  in order to access her bathroom/kitchen - Not Met SPV using RW        3. Pt will demonstrate improved LUE strength and coordination progressing  from functional assist to fully functional assist on the FUEL scale - Met       4. Pt will demonstrate improved standing balance/tolerance by completing an  IADL of her choice in standing for 10 minutes with SBA - Met       5. Pt will attend stroke education class and be able to independently  identify 3 controllable risk factors in order to decrease future CVAs - Met    PLAN  Occupational Therapy Plan: Patient is recommended for home health therapy  services.    Team Care Plan  Please review Integrated Patient View Care Plan Flowsheet for Team identified  Problems, Interventions, and Goals.    Identified problems from team documentation:  Problem: Impaired Cognition  Cognition: Primary Team Goal: Patient will utilize compensatory cognitive  strategies as needed in order to complete routine personal/household management  tasks (meal planning, scheduling appointments, etc.) with 80% accuracy provided  min A./Active    Problem: Impaired Mobility  Mobility: Primary Team Goal: Pt will be mod I for all bed mobility, transfers,  ambulation 350 feet with LRAD,  and supervision for two flights of stairs with  single HR./    Problem: Impaired Psychosocial Skills/Behavior  PsychoSocial: Primary Team Goal: Patient will regularly employ adaptive coping  skills for adjusting to medical illness, reduced function, and rehab  hospitalization via maintaining adherence with her rehab plan of care, and  expressing positive expectations for increasing her overall future functional  independence./    Problem: Impaired Self-care Mgmt/ADL/IADL  Self Care: Primary Team Goal: Pt will complete total body dressing including  item retrieval at a mod  I level using LRAD/Active    Status update for discharge:     Self Care Management:   Primary Goal: Not Met  Discharge Status Comment:   completes total body dressing with set-up assistance      3 Hour Rule Minutes: 60 minutes of OT treatment this session count towards  intensity and duration of therapy requirement. Patient was seen for the full  scheduled time of OT treatment this session.  Therapy Mode Minutes: Individual: 60 minutes.    Signed by: Merrilee Jansky, MOTS, OTR/L 08/27/2018 9:00:00 AM

## 2018-08-27 NOTE — Progress Notes (Signed)
[facility] Physiatry Attending Face to Face Progress  Note  [X] Discussed with nurse  .[x] No new events    Subjective:  Feels well;  has no new complaints.    No headache. No chest pain. No shortness of breath. No constipation.  Objective:  Vitals: BP 108/69   Pulse 85   Temp 97.9 F (36.6 C) (Oral)   Resp 18   Ht 1.651 m (5\' 5" )   Wt 96.8 kg (213 lb 8 oz)   SpO2 100%   BMI 35.53 kg/m   Appears well.In no apparent distress.Resting comfortably   Awake ; normal mood and affect.Sclerae anicteric.   Moist mucous membranes.   No respiratory distress .    Lab Results   Component Value Date    WBC 6.12 08/21/2018    HGB 9.8 (L) 08/21/2018    HCT 30.9 (L) 08/21/2018    MCV 83.7 08/21/2018    PLT 297 08/21/2018     Lab Results   Component Value Date    BUN 9 08/22/2018     Lab Results   Component Value Date    CREAT 0.9 08/22/2018     Lab Results   Component Value Date    NA 140 08/22/2018    K 4.2 08/22/2018    CL 105 08/22/2018    CO2 25 08/22/2018     Lab Results   Component Value Date    ALT 9 08/22/2018    AST 12 08/22/2018    ALKPHOS 75 08/22/2018    BILITOTAL 0.2 08/22/2018     Lab Results   Component Value Date    PROT 6.1 08/22/2018    ALB 3.2 (L) 08/22/2018     No results found for: INR, PT    No results found for: CHOL  No results found for: HDL  No results found for: LDL  No results found for: TRIG    No results found for: HGBA1C    Imaging reports reviewed:  Xr Chest 2 Views    Result Date: 08/22/2018   No active disease Kinnie Feil, MD 08/22/2018 4:30 PM    Mri Brain W Wo Contrast    Result Date: 08/22/2018  1.  Diffusion weighted imaging does not show definite evidence of acute or subacute infarct. 2.  Foci of hyperintense T2 and FLAIR signal are seen in the white matter of the cerebral hemispheres. This is a nonspecific finding. It may be related to chronic ischemic change from small vessel disease. 3.  No abnormal enhancing lesions are seen in the brain following contrast administration. Tana Felts, MD  08/22/2018 11:59 PM        New labs   Results     ** No results found for the last 24 hours. **          Medications:     Current Facility-Administered Medications   Medication Dose Route Frequency   . atorvastatin  40 mg Oral QHS   . dabigatran  150 mg Oral Q12H SCH   . fluticasone furoate-vilanterol  1 puff Inhalation QAM   . furosemide  20 mg Oral Daily   . lamoTRIgine  150 mg Oral BID   . lidocaine  1 patch Transdermal Q24H   . Lurasidone HCl  40 mg Oral QHS   . metoprolol succinate XL  25 mg Oral Daily   . pantoprazole  40 mg Oral BID AC   . psyllium  1 packet Oral Daily   . QUEtiapine  50  mg Oral QHS   . rOPINIRole  1 mg Oral QHS   . sucralfate  1 g Oral TID AC & HS       Assessment/Plan: 62 y.o. female  with dysfunction of mobility/ ADL due to CVA (cerebral vascular accident)  Continue comprehensive intensive inpatient rehab program. Medically stable. Continue current management.       Medication Review  1. A complete drug regimen review was completed: Yes  2. Were any drug issues found during review?: No       Hendricks Limes, DO  PM&R

## 2018-08-27 NOTE — Rehab Progress Note ([facility]) (Addendum)
Corrected 08/27/2018 6:52:48 AM    NAME: [name]  MRN: [id number]  Account: [id number]  Session Start: 08/27/2018 12:00:00 AM  Session Stop: 08/27/2018 12:00:00 AM    Rehabilitation Nursing  Inpatient Rehabilitation Shift Assessment    Rehab Diagnosis: CVA  Demographics:            Age: 54Y            Gender: Female  Primary Language: English    Date of Onset:  08/18/18  Date of Admission: 08/21/2018 6:06:19 PM    Rehabilitation Precautions Restrictions:   DM  diet consistent carbs  skin  Risk for Fall, seizure precautions, aspiration    Patient Report: "I feel like the Lamictal is making me itch." Checked pt's upper  and mid back. No redness or rash noted at this time.  Patient/Caregiver Goals:  "To be able to focus and not repeat myself so much"    Wounds/Incisions: No wounds or incisions.    Medication Review: No clinically significant medication issues identified this  shift.    Bowel and Bladder Output:                       Bladder (# only)             Bowel (# only)  Number of Episodes  Continent            2                            0  Incontinent          0                            0    Care Tool  Self-Care Functional Assessment  Eating: Branch  Oral Hygiene: Branch  Toileting Hygiene: Branch  Shower/Bathe Self: Branch  Upper Body Dressing: Branch  Lower Body Dressing: Branch  Putting On/Taking Off Footwear: Branch    Mobility Functional Assessment  Roll Left and Right: Branch  Sit to Lying: Branch  Lying to Sitting on Side of Bed: Branch  Sit to Stand: Marketing executive: Quarry manager: Copywriter, advertising Provided:    Education Provided: Safety issues and interventions. Fall protocol. Medication.  Name and dosage. Administration. Purpose. Side Effects. Interaction. Labs.  Anticoagulants.       Audience: Patient.       Mode: Explanation.       Response: Verbalized understanding.    Long Term Goals: 1. Patient will be able to list 3 risk factors for stroke and  will be able to  articulate the meaning and use of the FAST acronym for stroke  identification   2. Patient will know the names, indications, and side effects of all her  medications 100 percent of the time  2 weeks from 08/21/18  Short Term Goals: 1. Patient will be able to articulate the meaning of the FAST  acronym for stroke identification   2. Patient will know the names and indications of her medications 75 percent of  the time  1 week from 08/21/18    PROGRESS TOWARD GOALS: Pain management.  Interventions: Provide health teachings on pain monitoring using numeric scale,  Use of clinical indicators to monitor pain level, Explain medications ordered  for pain management, Determine etiology of pain, Evaluate, assess outcomes,  and/or effectiveness  of pain management, Assist patient to identify other/or  non- pharmacologic measures to alleviate pain (e.g., positioning, ROM exercises,  splinting, slow stretching exercises), Reinforce the use of alternative therapy,  treatments and/or other pain modalities recommended by MD/PT/OT.    Response to intervention(s): Pt requested PRN pain medication before bed and  administered.    Ongoing Education/Training: Continue to request pain medication prior to pain  getting above 4 or 5 out of 10.     Bowel management.  Interventions: Abdominal assessment, Rectal examination, Evaluate progress  towards goal/s, Assessment of the anal sphincter tone, Determine bowel pattern,  Assess patient?s ability to transfer, balance, and tolerance, Assess patient?s  ability to learn and ability to direct others, Develop safe, effective and  realistic bowel program, Allow patient and/or family to be involved in  developing safe, effective and realistic goal, Identify equipment needs    Response to intervention(s): Pt will have a warm drink in the mornings or fruit  to promote bowel stimulation.    Ongoing Education/Training: Intake to high fiber foods with at least one meal.    PLAN:  Rehab Nursing Specific  Interventions:  Continue with the current Nursing Plan of Care.    TEAM CARE PLAN  Identified problems from team documentation:  Problem: Impaired Cognition  Cognition: Primary Team Goal: Patient will utilize compensatory cognitive  strategies as needed in order to complete routine personal/household management  tasks (meal planning, scheduling appointments, etc.) with 80% accuracy provided  min A./Active    Problem: Impaired Mobility  Mobility: Primary Team Goal: Pt will be mod I for all bed mobility, transfers,  ambulation 350 feet with LRAD, and supervision for two flights of stairs with  single HR./    Problem: Impaired Psychosocial Skills/Behavior  PsychoSocial: Primary Team Goal: Patient will regularly employ adaptive coping  skills for adjusting to medical illness, reduced function, and rehab  hospitalization via maintaining adherence with her rehab plan of care, and  expressing positive expectations for increasing her overall future functional  independence./    Problem: Impaired Self-care Mgmt/ADL/IADL  Self Care: Primary Team Goal: Pt will complete total body dressing including  item retrieval at a mod I level using LRAD/Active    Please review Integrated Patient View Care Plan Flowsheet for Team identified  Problems, Interventions, and Goals.    Signed by: Satira Anis, RN 08/26/2018 6:34:00 AM

## 2018-08-27 NOTE — Rehab Progress Note ([facility]) (Signed)
NAMESHELSEA HANGARTNER  MRN: [id number]  Account: [id number]  Session Start: 08/27/2018 1:00:00 PM  Session Stop: 08/27/2018 2:00:00 PM    Physical Therapy  Inpatient Rehabilitation Progress Note - Brief    Rehab Diagnosis: CVA  Demographics:            Age: 81Y            Gender: Female  Rehabilitation Precautions/Restrictions:   DM  diet consistent carbs  skin  Risk for Fall, seizure precautions, aspiration    SUBJECTIVE    Pain: Patient currently without complaints of pain.    OBJECTIVE  Vital Signs:                       Before Activity              After Activity  Vitals  BP Systolic          107                          110  BP Diastolic         68                           72  Pulse                81                           83    Interventions:       Therapeutic Activities: Pt. received seated in chair at EOB agreeable to PT  session. Pt. engaged in gait training 2 x 150' with RW with Supv. Pt.  benefitting from verbal cues for close RW proximity and to maintain midline in  walker as she edges to L side. Pt. without LOB throughout. Pt. practiced stair  negotiation 1 x 12 stairs with Supv for cues to ensure foot placement on stairs.  Pt. participated in supine and sidelying ther-ex targeting NMRE to hip  musculature. Provided printed copy of HEP for carryover in the home environment.    Patient was left seated in chair in his/her room. Handoff to nurse completed.  Chair alarm in place and activated.  Oriented to call bell and placed within reach.  Personal items within reach.  Assistive devices positioned out of reach.    ASSESSMENT  Pt. reaching Supv level with RW for all mobility this session in prep for  upcoming d/c. Family training scheduled for next session in order to facilitate  a safe return her home environment with her daughter's assistance.    PLAN  Continued Physical Therapy is recommended.  Recommended Frequency/Duration/Intensity: 60-120 min/day, 5-7 days a week, for  5-7 days  Activities Contributing  Toward Care Plan: TE, TA, NMRE, gait training, assistive  device training, family training, DME education/procurement, individualized HEP,  discharge planning    3 Hour Rule Minutes: 60 minutes of PT treatment this session count towards  intensity and duration of therapy requirement. Patient was seen for the full  scheduled time of PT treatment this session.  Therapy Mode Minutes: Individual: 60 minutes.    Signed by: Truddie Crumble, PT, DPT 08/27/2018 2:00:00 PM

## 2018-08-27 NOTE — Rehab Progress Note ([facility]) (Signed)
NAMEDESHARA [name]  MRN: [id number]  Account: [id number]  Session Start: 08/27/2018 12:00:00 AM  Session Stop: 08/27/2018 12:00:00 AM    Rehabilitation Nursing  Inpatient Rehabilitation Shift Assessment    Rehab Diagnosis: CVA  Demographics:            Age: 63Y            Gender: Female  Primary Language: English    Date of Onset:  08/18/18  Date of Admission: 08/21/2018 6:06:19 PM    Rehabilitation Precautions Restrictions:   DM  diet consistent carbs  skin  Risk for Fall, seizure precautions, aspiration    Patient Report: "You all are so professional here."  Patient/Caregiver Goals:  "To be able to focus and not repeat myself so much"    Wounds/Incisions: No wounds or incisions.    Medication Review: No clinically significant medication issues identified this  shift.    Bowel and Bladder Output:  Grid    Care Tool  Self-Care Functional Assessment  Eating: Branch  Oral Hygiene: Branch  Toileting Hygiene: Branch  Shower/Bathe Self: Branch  Upper Body Dressing: Branch  Lower Body Dressing: Branch  Putting On/Taking Off Footwear: Tax adviser Assessment  Roll Left and Right: Branch  Sit to Lying: Branch  Lying to Sitting on Side of Bed: Branch  Sit to Stand: Marketing executive: Quarry manager: Copywriter, advertising Provided:    Education Provided: Safety issues and interventions. Fall protocol. Medication.  Name and dosage. Administration. Purpose. Side Effects. Interaction. Labs.  Anticoagulants.       Audience: Patient.       Mode: Explanation.       Response: Verbalized understanding.    Long Term Goals: 1. Patient will be able to list 3 risk factors for stroke and  will be able to articulate the meaning and use of the FAST acronym for stroke  identification   2. Patient will know the names, indications, and side effects of all her  medications 100 percent of the time  2 weeks from 08/21/18  Short Term Goals: 1. Patient will be able to articulate the meaning of the FAST  acronym  for stroke identification   2. Patient will know the names and indications of her medications 75 percent of  the time  1 week from 08/21/18    PROGRESS TOWARD GOALS: Bowel management.  Interventions: Abdominal assessment, Rectal examination, Evaluate progress  towards goal/s, Assessment of the anal sphincter tone, Determine bowel pattern,  Assess patient?s ability to transfer, balance, and tolerance, Assess patient?s  ability to learn and ability to direct others, Develop safe, effective and  realistic bowel program, Allow patient and/or family to be involved in  developing safe, effective and realistic goal, Identify equipment needs, Provide  comfort, explain procedure, Use of the least noxious stimulant/s to produce  effective results, Use of chemical stimulants such as stool meds, suppository,  Assist patient to assume upright position for (possible) evacuation, Perform  digital stimulation, Manual removal (if evacuation does not occur)    Response to intervention(s): Yes, I will order fruit for breakfasst and lunch  tomorrow to increase fiber intake.    Ongoing Education/Training: Fiber such as fruit or salad will assist with bowel  movements.     Risk of falls/injuries.  Intervention(s): Teach patient/ family to evaluate situation and environment for  hazards, Medication review, Fall risk assessment using EBP tool, Use of chair  and/or bed alarms, Make  sure call light, personal belongings within the  patient?s reach, Recognize, assist, and anticipate basic needs, Establish and/or  assist with regular toileting program, Provide distraction, direct attention to  other activities, Frequent safety check, rounding, Use of non-skid footwear,  Maintain adequate lighting, Reinforce safety instruction for transfer  techniques, gait training and use of mobility devices recommended by PT/OT,  Encourage and/assist with safe use of assistive devices during ambulation, Allow  patient and/family to identify safety problems  and environmental hazards at home  and determine actions to prevent accidents/ injuries, Allow patient and/or  family to demo learned strategies to prevent falls/injuries    Response to intervention(s): "Oh, okay.    Ongoing Education/Training: Patients tend to fall on the first 3 days here and  on the last 3 days here. They either feel like I don't belong here or I'm going  home. I don't need help to the bathroo.    PLAN:  Rehab Nursing Specific Interventions:  Continue with the current Nursing Plan of Care.    TEAM CARE PLAN  Identified problems from team documentation:  Problem: Impaired Cognition  Cognition: Primary Team Goal: Patient will utilize compensatory cognitive  strategies as needed in order to complete routine personal/household management  tasks (meal planning, scheduling appointments, etc.) with 80% accuracy provided  min A./Active    Problem: Impaired Mobility  Mobility: Primary Team Goal: Pt will be mod I for all bed mobility, transfers,  ambulation 350 feet with LRAD, and supervision for two flights of stairs with  single HR./    Problem: Impaired Psychosocial Skills/Behavior  PsychoSocial: Primary Team Goal: Patient will regularly employ adaptive coping  skills for adjusting to medical illness, reduced function, and rehab  hospitalization via maintaining adherence with her rehab plan of care, and  expressing positive expectations for increasing her overall future functional  independence./    Problem: Impaired Self-care Mgmt/ADL/IADL  Self Care: Primary Team Goal: Pt will complete total body dressing including  item retrieval at a mod I level using LRAD/Not Met    Please review Integrated Patient View Care Plan Flowsheet for Team identified  Problems, Interventions, and Goals.    Signed by: Satira Anis, RN 08/27/2018 10:23:00 PM

## 2018-08-28 DIAGNOSIS — I639 Cerebral infarction, unspecified: Secondary | ICD-10-CM

## 2018-08-28 NOTE — Progress Notes (Signed)
Patient slept intermittently. Denies any complaints of chest pain. NSR on Telemonitor. VSS. Up to the bathroom twice for toileting needs but still no BM. Offered dulcolax suppository last night but opted for Lactulose. Denies any complaints of nausea or abdominal discomfort. Encouraged po fluids and prune juice. Looking forward to going home tomorrow. Continue to monitor closely.

## 2018-08-28 NOTE — Rehab Discharge Summary ([facility]) (Addendum)
Corrected 08/28/2018 3:30:18 PM    NAME: [name]  MRN: [id number]  Account: [id number]  Session Start: 08/28/2018 10:00:00 AM  Session Stop: 08/28/2018 11:00:00 AM    Speech Language Pathology  Inpatient Rehabilitation  Language Cognitive and Dysphagia Discharge Summary and Note    Rehab Diagnosis: CVA  Demographics:            Age: 62Y            Gender: Female  Primary Language: English    Past Medical History: A/Fib  HTN  HLD  DM  Migraine  Seizure  Obesity  Syncope  History of Present Illness: Per medical record review, "[[name] is a]  62 YO F admitted with headache and left side weakness, slurred speech, chills  and blurry vision.  CT Negative could not do MRI r/t pt with loop recorder.  Neurologist said most likely CVA. Patient with HX of seizures last one 1.5  months ago. Obese, DM which is controlled and HTn that is controlled. Patient is  on aspiration precautions on a Mechanical chopped diet with thin liquids and is  working with PT and OT. Patient was I prior to admission and is now below her  baseline, would benefit from inpatient AR prior to going home with family."   Date of Onset: 08/18/18   Date of Admission: 08/21/2018 6:06:19 PM  Premorbid Functional Level: Mobility:   I with ambulation  Activities of Daily Living:  I with ADL's  Cognition:  Pt endorsed forgetfulness prior to recent CVA.  Communication:  WNL  Swallowing:  WNL  Social/Educational History: Per psych consultation: "Patient reported a  longstanding psychiatric history with at 4 previous in-patient psychiatric  hospitalizations; last admission was /R/5 years ago while living in [place]. Patient denied any history of suicide attempts, but did admit to a  history of suicidal ideation.    Historically patient reported being diagnosed with Bipolar Disorder with  psychotic features. Prior to her most recent admission, the patient had been  followed by an outpatient psychiatrist and psychotherapist in the [place],  [place]  area. Per patient, mood was stable with her home psychotropic medication and  quarterly outpatient psychotherapy sessions. No report of any active psychotic  or manic episodes in over a year."  Home Environment: Lives with daughter in 3 level home with 0 STE to enter. Down  one flight to basement with single HR where she stays with full bath.    Rehabilitation Precautions/Restrictions:   DM  diet consistent carbs  skin  Risk for Fall, seizure precautions, aspiration    SUBJECTIVE  Pain: Patient currently without complaints of pain.      OBJECTIVE    Current Diet: Patient is currently tolerating a mechanical soft diet and thin  liquids. Reports she is satisfied on current diet.    Today's Treatment Interventions:       Speech Treatment: Daughter present for family training, which focused on  external and internal strategies to support memory.    -External memory strategies: Reviewed note-taking, to-do lists, filing systems,  alarms on phone and pill boxes. Pt reported premorbid use of all strategies with  the exception of phone alarms. Pt and daughter both endorsed premorbid  forgetfulness, especially with medical appointments. Recommended use of alarms  for medications and appointments with supervision from daughter.    -Internal memory strategies: Reviewed rehearsal/repetition and chunking.  Provided examples of functional incorporation of strategies, such as to recall  steps to a  novel task. Pt demonstrated use of strategies during a list recall  task with 100% accy for 3 words, decreasing to 50% with 4 words. Benefited from  writing down information in addition to internal strategy use.    -HEP: Provided patient with structured working Optician, dispensing as well as ideas  for functional memory tasks.  Patient was left seated in chair in his/her room.  Patient was left seated in chair with family/friends present. Handoff to nurse  completed.  Chair alarm in place and activated.  Oriented to call bell and placed  within reach.    Home Exercise Program: The patient was provided with an individualized home  exercise program.  Education Provided:    Education Provided: Plan of care. Home exercise/activity plan. Home management  activities. Cognitive functioning. Stroke. Stroke recovery       Audience: Patient and Caregiver       Mode: Explanation.  Teacher, English as a foreign language provided.       Response: Verbalized understanding.  Demonstrated skill.  Needs practice.  Needs reinforcement.    ASSESSMENT  Summary of Progress and Current Status: [name] continues to demonstrate  moderate cognitive deficits, primarily in the areas of memory, complex attention  and cognitive flexibility. Sessions focused on external and internal memory  strategy training to increase independence with daily tasks and health  management. [name] demonstrated improved recall of information with repeated  exposure, rehearsal and recording details in a notebook/memory log. External  memory strategies were more effective than internal strategies for delayed  retrieval. Due to impact of memory deficits on accuracy with certain health  management tasks, recommend supervision with IADL's (i.e. Medication management,  tracking health information, cooking, finances), which her daughter can provide.  Follow up SLP services are recommended to continue target cognitive function via  compensatory strategies and restorative exercises.    Progress Toward Goals (final status):     LONG TERM GOAL REVIEW:       1. Patient will take effective notes, recording pertinent details from  information provided verbally, in 80% of opportunities provided min A in order  to support recall of important conversations, education, etc. - Met       2. Patient will utilize compensatory cognitive strategies as needed in  order to complete routine personal/household management tasks (meal planning,  scheduling appointments, etc.) with 80% accuracy provided min A. - Met    PLAN  Speech Pathology  Plan: Patient is recommended for home health therapy services.    Team Care Plan  Please review Integrated Patient View Care Plan Flowsheet for Team identified  Problems, Interventions, and Goals.    Identified problems from team documentation:  Problem: Impaired Cognition  Cognition: Primary Team Goal: Patient will utilize compensatory cognitive  strategies as needed in order to complete routine personal/household management  tasks (meal planning, scheduling appointments, etc.) with 80% accuracy provided  min A./Active    Problem: Impaired Mobility  Mobility: Primary Team Goal: Pt will be mod I for all bed mobility, transfers,  ambulation 350 feet with LRAD, and supervision for two flights of stairs with  single HR./Met    Problem: Impaired Psychosocial Skills/Behavior  PsychoSocial: Primary Team Goal: Patient will regularly employ adaptive coping  skills for adjusting to medical illness, reduced function, and rehab  hospitalization via maintaining adherence with her rehab plan of care, and  expressing positive expectations for increasing her overall future functional  independence./    Problem: Impaired Self-care Mgmt/ADL/IADL  Self Care: Primary Team Goal:  Pt will complete total body dressing including  item retrieval at a mod I level using LRAD/Not Met    Status update for discharge:     Cognition:   Primary Goal: Met  Discharge Status Comment:   Patient demonstrated effective use of external  memory aids to support recall of pertinent information during simulated  household tasks (i.e. Bill pay) with 100% accy given supervision support.    3 Hour Rule Minutes: 60 minutes of SLP treatment this session count towards  intensity and duration of therapy requirement. Patient was seen for the full  scheduled time of SLP treatment this session.  Therapy Mode Minutes: Individual: 60 minutes.    Signed by: Lyndle Herrlich, M.Ed., CCC-SLP 08/28/2018 11:00:00 AM

## 2018-08-28 NOTE — Progress Notes (Signed)
[name] [name] [name]  MRN: [id number]  Account: [id number]  Session Start: 08/28/2018 3:00:00 PM  Session Stop: 08/28/2018 4:00:00 PM    Inpatient Rehabilitation Group Note    Rehab Diagnosis: CVA  Demographics:            Age: 46Y            Gender: Female  Rehabilitation Precautions/Restrictions:   DM  diet consistent carbs  skin  Risk for Fall, seizure precautions, aspiration    OBJECTIVE  Group Type: Stroke education class was held in dayroom setting.  Group Participant(s): Patient    Treatment Provided: Participant(s) received education on the following topics:  what is stroke, types of stroke, signs /T/ symptoms associated with stroke,  anatomy/physiology of the brain, stroke prevention, stroke recovery, nutrition    Participants received education via explanation, printed material, class  Participant(s) verbalized understanding, needs practice    ASSESSMENT  Participant(s) benefitted from support and encouragement of peers in a social  setting.  Participant(s) asked appropriate questions.  Participant(s) shared personal experiences.  Participant(s) had good participation.    Signed by: Ruthy Dick, M.S., CCC/SLP 08/28/2018 4:00:00 PM

## 2018-08-28 NOTE — Rehab Discharge Summary ([facility]) (Signed)
[name]  MRN: [id number]  Account: [id number]  Session Start: 08/28/2018 11:00:00 AM  Session Stop: 08/28/2018 12:00:00 PM    Physical Therapy  Inpatient Rehabilitation Discharge Summary and Note    Rehab Diagnosis: CVA  Demographics:            Age: 62Y            Gender: Female  Primary Language: English  Past Medical History: A/Fib  HTN  HLD  DM  Migraine  Seizure  Obesity  Syncope  History of Present Illness: Per medical record review, "[[name] is a] 62  YO F admitted with headache and left side weakness, slurred speech, chills and  blurry vision.CT Negative could not do MRI r/t pt with loop recorder.  Neurologist said most likely CVA. Patient with HX of seizures last one 1.5  months ago. Obese, DM which is controlled and HTn that is controlled. Patient is  on aspiration precautions on a Mechanical chopped diet with thin liquids and is  working with PT and OT. Patient was I prior to admission and is now below her  baseline, would benefit from inpatient AR prior to going home with family."   Date of Onset: 08/18/18   Date of Admission: 08/21/2018 6:06:19 PM  Rehabilitation Precautions/Restrictions:   DM  diet consistent carbs  skin  Risk for Fall, seizure precautions, aspiration    SUBJECTIVE    Pain: Patient currently without complaints of pain.    OBJECTIVE  Vital Signs:                       Before Activity              After Activity  Vitals  BP Systolic          138                          127  BP Diastolic         80                           78  Pulse                76                           70    CARE Tool Discharge Performance  Mobility Functional Assessment  Roll Left and Right: 06 - Independent:  Patient completed the activities by  him/herself with no assistance from a helper and without use of assistive  devices.  Sit to Lying: 06 - Independent:  Patient completed the activities by him/herself  with no assistance from a helper and without use of assistive devices.  Lying to Sitting  on Side of Bed: 06 - Independent:  Patient completed the  activities by him/herself with no assistance from a helper and without use of  assistive devices.  Sit to Stand: 06 - Independent:  Patient completed the activities by him/herself  with no assistance from a helper and without use of assistive devices.  Chair/Bed-to-Chair Transfer: Transfering to and from a bed were  observed/assessed. 06 - Independent:  Patient completed the activities by  him/herself with no assistance from a helper and with use of assistive devices.  Assistive Device(s):   Location manager Transfer: 06 - Independent:  Patient completed  the activities by him/herself  with no assistance from a helper and with use of assistive devices.  Assistive Device(s):   Walker  Walk 10 Feet: 06 - Independent:  Patient completed the activities by him/herself  with no assistance from a helper and with use of assistive devices.  Assistive Device(s):   Rolling walker  Walk 50 Feet with Two Turns: 06 - Independent:  Patient completed the activities  by him/herself with no assistance from a helper and with use of assistive  devices.  Assistive Device(s):   Rolling walker  Walk 150 Feet:  06 - Independent:  Patient completed the activities by  him/herself with no assistance from a helper and with use of assistive devices.  Assistive Device(s):   Rolling walker  Walking 10 Feet on Uneven Surface: 04 - Supervision or touching assistance:  Helper provides verbal cues and/or touching/steadying and/or contact guard  assistance as patient completes activity.  Assistance Provided:   General supervision for safety  Assistive Device(s):   Rolling walker  1 Step (curb):  04 - Supervision or touching assistance:  Helper provides verbal  cues and/or touching/steadying and/or contact guard assistance as patient  completes activity.  Assistance Provided:   General supervision for safety  Assistive Device(s):   Handrail(s)  4 Steps:  04 - Supervision or touching assistance:   Helper provides verbal cues  and/or touching/steadying and/or contact guard assistance as patient completes  activity.  Assistance Provided:   General supervision for safety  Assistive Device(s):   Handrail(s)  12 Steps: 04 - Supervision or touching assistance:  Helper provides verbal cues  and/or touching/steadying and/or contact guard assistance as patient completes  activity.  Assistance Provided:   General supervision for safety  Assistive Device(s):   Handrail(s)  Picking up Objects: 06 - Independent:  Patient completed the activities by  him/herself with no assistance from a helper and with use of assistive devices.  Assistive Device(s:)    RW  Wheelchair: Patient does not use a wheelchair or scooter.    Today's Treatment Interventions:       Therapeutic Activities:  Tx session focused on family training with pt.'s  daughter in prep for d/c tomorrow. Addressed all above mobility providing  education on safety and walker use. Pt. engaged in floor transfer training with  supv. Answered all questions as needed.  Patient was left seated in chair in his/her room.  Patient was left seated in chair with family/friends present. Handoff to nurse  completed.  Oriented to call bell and placed within reach.  Personal items within reach.  Assistive devices positioned out of reach.    Education Provided:    Education Provided: Functional transfers. Bed mobility. Gait.  Stair/curb/environmental barrier negotiation. Equipment. Driving. Car transfers.  Safety. Home exercise/activity plan.       Audience: Patient and Caregiver       Mode: Explanation.  Demonstration.       Response: Verbalized understanding.  Demonstrated skill.    Lower Extremity Orthosis: Patient does not present with foot drop or ankle  instability and does not need an orthotic.  Home Exercise Program: The patient was provided with an individualized home  exercise program.  Equipment Provided/Ordered: The patient has been given voluntary selection of  a  medical equipment provider, as per federal law. Arrangements have been made with  a company of the patient?s choosing. The patient has been educated that choosing  a company outside of [employer] company?s preferred provider may affect their  insurance coverage.The patient has been provided with information on care of  equipment as appropriate. Adult RW'    ASSESSMENT  Summary of Progress and Current Status: Ms. Roets has progressed to a Mod I  level with her household mobility using a rolling walker with the exception of  stair negotiation where supv is recommended for safety. Pt.'s daughter was  present for training and plans to provide assistance as needed in the home.  Recommend continued skilled PT services in the home environment to address  deficits in strength, balance and endurance in order to maximize indep. and  safety and promote community reintegration as appropriate.      Progress Toward Goals (final status):   LONG TERM GOAL REVIEW:       1. Pt will be mod I with all bed mobility in order to facilitate  independence at home environment. - Met       2. Pt will be mod I with all transfers from a variety of surface heights in  order to prep for d/c to home enviornment. - Met       3. Pt will ambulate 350 feet with LRAD and mod I in order to prep for d/c  home. - Met       4. Pt will complete 2 flights of stairs with supervision and single HR in  order to prep for d/c home. - Met       5. Pt caregiver will demonstrate proper understanding of assisting pt with  stairs in order to prep for d/c home. - Met    PLAN  Physical Therapy Plan: Patient is recommended for home health therapy services.    Team Care Plan  Please review Integrated Patient View Care Plan Flowsheet for Team identified  Problems, Interventions, and Goals.    Identified problems from team documentation:  Problem: Impaired Cognition  Cognition: Primary Team Goal: Patient will utilize compensatory cognitive  strategies as needed in  order to complete routine personal/household management  tasks (meal planning, scheduling appointments, etc.) with 80% accuracy provided  min A./Active    Problem: Impaired Mobility  Mobility: Primary Team Goal: Pt will be mod I for all bed mobility, transfers,  ambulation 350 feet with LRAD, and supervision for two flights of stairs with  single HR./    Problem: Impaired Psychosocial Skills/Behavior  PsychoSocial: Primary Team Goal: Patient will regularly employ adaptive coping  skills for adjusting to medical illness, reduced function, and rehab  hospitalization via maintaining adherence with her rehab plan of care, and  expressing positive expectations for increasing her overall future functional  independence./    Problem: Impaired Self-care Mgmt/ADL/IADL  Self Care: Primary Team Goal: Pt will complete total body dressing including  item retrieval at a mod I level using LRAD/Not Met    Status update for discharge:     Mobility:   Primary Goal: Met  Discharge Status Comment:   Mod I transfers and ambulation; Supv stair  negotiation    3 Hour Rule Minutes: 60 minutes of PT treatment this session count towards  intensity and duration of therapy requirement. Patient was seen for the full  scheduled time of PT treatment this session.  Therapy Mode Minutes: Individual: 60 minutes.    Signed by: Truddie Crumble, PT, DPT 08/28/2018 12:00:00 PM

## 2018-08-28 NOTE — Progress Notes (Signed)
PHYSICAL MEDICINE AND REHABILITATION  PROGRESS NOTE  FACE TO Bruce Donath    Date Time: 08/28/18 4:55 PM    Patient Name: [name],[name]  Admission date:  08/21/2018  (LOS: 7 days)    Subjective:     Patient denies pain. Slept well. Having regular bowel movements. No new issues.   Ready for discharge home tomorrow     Functional Status:     Today's Treatment Interventions:       Therapeutic Activities:     Tx session focused on family training with pt.'s  daughter in prep for d/c tomorrow. Addressed all above mobility providing  education on safety and walker use. Pt. engaged in floor transfer training with  supv. Answered all questions as needed.  Patient was left seated in chair in his/her room.  Patient was left seated in chair with family/friends present. Handoff to nurse  completed.    Medications:   Medication reviewed by me:     Scheduled Meds: PRN Meds:    atorvastatin, 40 mg, Oral, QHS  dabigatran, 150 mg, Oral, Q12H SCH  fluticasone furoate-vilanterol, 1 puff, Inhalation, QAM  furosemide, 20 mg, Oral, Daily  lamoTRIgine, 150 mg, Oral, BID  lidocaine, 1 patch, Transdermal, Q24H  Lurasidone HCl, 40 mg, Oral, QHS  metoprolol succinate XL, 25 mg, Oral, Daily  pantoprazole, 40 mg, Oral, BID AC  psyllium, 1 packet, Oral, Daily  QUEtiapine, 50 mg, Oral, QHS  rOPINIRole, 1 mg, Oral, QHS  sucralfate, 1 g, Oral, TID AC & HS        Continuous Infusions:   acetaminophen, 650 mg, Q4H PRN  albuterol, 2.5 mg, Q6H PRN  bisacodyl, 10 mg, Daily PRN  butalbital-acetaminophen-caffeine, 1 tablet, Q6H PRN  diphenhydrAMINE, 25 mg, Q6H PRN  lactulose, 20 g, BID PRN  ondansetron, 4 mg, Q6H PRN  traMADol, 50 mg, BID PRN          Medication Review  1. A complete drug regimen review was completed: Yes  2. Were drug issues were found during review: No   If yes to drug issues found during review:   What was the issue?:    What was the time the issue was identified?:    Was I contacted and action was taken by midnight of the next  calendar day once issue was identified?: N/A    Person who contacted me:    Action taken:     Review of Systems:   A comprehensive review of systems was: No fevers, chills, nausea, vomiting, chest pain, shortness of breath, cough, headache, double vision.  All others negative.    Labs:     Recent Labs   Lab 08/21/18  2032   WBC 6.12   Hgb 9.8*   Hematocrit 30.9*   Platelets 297        Recent Labs   Lab 08/22/18  0319   Sodium 140   Potassium 4.2   Chloride 105   CO2 25   BUN 9   Creatinine 0.9   Calcium 9.1   Albumin 3.2*   Protein, Total 6.1   Bilirubin, Total 0.2   Alkaline Phosphatase 75   ALT 9   AST (SGOT) 12   Glucose 111*   Magnesium 1.9   Phosphorus 4.3             Rads:   Radiological Procedure reviewed.  Radiology Results (24 Hour)     ** No results found for the last 24 hours. **  Physical Exam:     Vitals:    08/28/18 0848 08/28/18 0924 08/28/18 0949 08/28/18 1623   BP:  122/73 122/71 115/70   Pulse:  70 72 69   Resp:    16   Temp:    98.6 F (37 C)   TempSrc:    Oral   SpO2: 99%   99%   Weight:       Height:           Intake and Output Summary (Last 24 hours) at Date Time    Intake/Output Summary (Last 24 hours) at 08/28/2018 1655  Last data filed at 08/28/2018 0600  Gross per 24 hour   Intake 120 ml   Output -   Net 120 ml     P.O.: 120 mL (08/28/18 0600)     Urine: 1 mL (08/27/18 0038)               Cardiac: regular rhythm  Chest / Lungs:  Clear to auscultation.  Abdomen:  + bowel sounds, Soft, non-tender, non-distended.  Extremities: no calf tenderness.    Assessment and Plan:     Atypical chest pain  Resolved, non cardiac   No EKG changes  Troponin negative  Discussed with Dr Lucianne Muss her cardiologist on 08/22/18, has had extensive cardiac work up, no need for further testing at this time  Chest xray with no acute findings       Left sided weakness/numbness  Presented with presumed CVA form OSH  Currently on pradaxa  MRI done, no acute evidence of cva  Continue statin  Evaluated by  cardiology       Paroxymsmal Afib  Currently in sinus  S/p linq recorder with medtronic  Confirmed with Dr Lucianne Muss office  On pradaxa   Continue metoprolol    Asthma  Not in exacerbation, mild wheeze will request nebulizer treatment  Continue inhalers    Headache  Complaining of mild headache  Fiorcet prn       HLD  Continue statin    HTN  stable  On metoprolol    Diet controlled diabetes    Pysch:  Continue home psych meds    DVT ppx: on pradaxa      Continue comprehensive and intensive inpatient rehab program, including:   Physical therapy 60-120 min daily, 5-6 times per week, Occupational therapy 60-120 min daily, 5-6 times per week, Case management and Rehabilitation nursing.  Psychology services to evaluate and treat as needed     Signed by: Cheryll Dessert MD  08/28/2018, 4:55 PM    [facility]

## 2018-08-28 NOTE — Rehab Progress Note ([facility]) (Signed)
[name]  MRN: [id number]  Account: [id number]  Session Start: 08/28/2018 12:00:00 AM  Session Stop: 08/28/2018 12:00:00 AM    Case Management  Inpatient Rehabilitation Family Conference Note    Rehab Diagnosis: CVA  Demographics:            Age: 25Y            Gender: Female    Individuals Present at [employer]                       Team Member Present  Discipline  Nurse                [place]  Case Manager         Kellie Shropshire  PT                   [place]  OT                   Ave Filter Tyrell  SLP                  Lyndle Herrlich via notes    Family members and significant others:  1) patient 2) Morrell Riddle, daughter    Issues Discussed During Conference:    SN: calls appropriately and continues with pain issues. BP stable. eating well.  No checking BS any further; recommend check 3 times per week at home, document  and discuss with PCP.    PT/OT: closeby assist for first few showers and use shower chair (in place).  Independent with ADL's. slowly integrate back into normal tasks. supervision for  self care tasks. has made great progress. recommend home health.    SLP: has been progressing nicely in speech therapy. We are focusing on improving  memory through writing down information and rehearsing/repeating information out  loud. Mrs. Willeford has demonstrated ability to utilize both strategies to recall  new information about therapy and her health but I would still recommend someone  over see higher level thinking tasks in the home, such as medications, finances,  cooking, etc. I recommend follow up with home health and outpatient to continue  working on cognitive skills.    DME recommended 1) shower chair (in place) 2) toilet safety frame (purchase)    Resources:  1) Disabled placard application for DMV  2) Glucometer machine (patient reports there is something wrong with her machine  at home.    SW discussed home health arrangements. Outpatient Rx for Neuro rehab to be  provided after completion of home  health. Daughter will transport home tomorrow.      Discharge Date: Wednesday, August 29, 2018; Home with Home Health    Signed by: Kellie Shropshire, MSW 08/28/2018 1:00:00 PM

## 2018-08-28 NOTE — Progress Notes (Signed)
NAMERAJA CAPUTI  MRN: [id number]  Account: [id number]  Session Start: 08/24/2018 12:00:00 AM  Session Stop: 08/24/2018 12:00:00 AM    Inpatient Rehabilitation Group Note    Rehab Diagnosis: CVA  Demographics:            Age: 8Y            Gender: Female  Rehabilitation Precautions/Restrictions:   DM  diet consistent carbs  skin  Risk for Fall, seizure precautions, aspiration    SUBJECTIVE  Patient Report: ' I have diabetes'.    OBJECTIVE  Group Type: Diabetes education group was held in dayroom setting.  Group Participant(s): Patient    Treatment Provided: What is diabetes, types of diabetes, signs /T/ symptoms  associated with diabetes, lifetime management, inspection of feet /T/ pressure  points, wearing proper socks/shoes, regular podiatry visits, when to contact a  physician, exercise and blood sugar control, hypoglycemia/hyperglycemia.,  insulin types/administration/management, blood sugar monitoring/management    ASSESSMENT  Participant(s) benefitted from support and encouragement of peers in a social  setting.  Participant(s) asked appropriate questions.  Participant(s) shared personal experiences.  Participant(s) had good participation.  Benefitted from psychosocial stimulation in a group setting  Socialized with peers and therapists present.  Benefitted from reminiscing and sharing personal stories.    Signed by: Lestine Box, RN 08/24/2018 12:00:00 PM

## 2018-08-28 NOTE — Rehab Progress Note ([facility]) (Signed)
[name]  MRN: [id number]  Account: [id number]  Session Start: 08/28/2018 12:00:00 AM  Session Stop: 08/28/2018 12:00:00 AM    Rehabilitation Nursing  Inpatient Rehabilitation Shift Assessment    Rehab Diagnosis: CVA  Demographics:            Age: 47Y            Gender: Female  Primary Language: English    Date of Onset:  08/18/18  Date of Admission: 08/21/2018 6:06:19 PM    Rehabilitation Precautions Restrictions:   DM  diet consistent carbs  skin  Risk for Fall, seizure precautions, aspiration    Patient Report: ' Am going home tomorrow'.  Patient/Caregiver Goals:  "To be able to focus and not repeat myself so much"    Wounds/Incisions: No wounds or incisions.    Medication Review: No clinically significant medication issues identified this  shift.    Bowel and Bladder Output:                       Bladder (# only)             Bowel (# only)  Number of Episodes  Continent            4                            0  Incontinent          0                            0          Education Provided:    Education Provided: Pain management. Pain scale. Medication options. Side  effects. Clinical indicators of pain. Safety issues and interventions. Impaired  coordination. Stroke. What is stroke, Types of stroke, Signs /T/ Symptoms  associated with stroke, Etiology of stroke, Anatomy/Physiology of the brain,  Stroke prevention, Medication compliance, Control blood pressure, high  cholesterol and/or diabetes, Smoking cessation, Healthy diet, Increase physical  activity, Reduce stress, Limit alcohol intake, Stroke recovery, Emotional  adjustments/depression, Thrombophlebitis (DVT) Medication. Name and dosage.  Administration. Purpose. Side Effects. Interaction. Labs. Anticoagulants.       Audience: Patient and Caregiver       Mode: Explanation.  Stroke ed booklet.       Response: Verbalized understanding.  Needs reinforcement.    Long Term Goals: 1. Patient will be able to list 3 risk factors for stroke and  will be  able to articulate the meaning and use of the FAST acronym for stroke  identification   2. Patient will know the names, indications, and side effects of all her  medications 100 percent of the time  2 weeks from 08/21/18  Short Term Goals: 1. Patient will be able to articulate the meaning of the FAST  acronym for stroke identification   2. Patient will know the names and indications of her medications 75 percent of  the time  1 week from 08/21/18    PROGRESS TOWARD GOALS: Pain management.  Interventions: Use of clinical indicators to monitor pain level, Explain  medications ordered for pain management, Determine etiology of pain, Evaluate,  assess outcomes, and/or effectiveness of pain management, Assist patient to  identify other/or non- pharmacologic measures to alleviate pain (e.g.,  positioning, ROM exercises, splinting, slow stretching exercises), Reinforce the  use of alternative therapy, treatments and/or  other pain modalities recommended  by MD/PT/OT.    Response to intervention(s): Tramadol prn given for pain relief with good result      Ongoing Education/Training: Continue pain management strategies to relieve pain  while in rehab.     Risk of falls/injuries.  Intervention(s): Fall risk assessment using EBP tool, Use of chair and/or bed  alarms, Make sure call light, personal belongings within the patient?s reach,  Recognize, assist, and anticipate basic needs, Establish and/or assist with  regular toileting program, Provide distraction, direct attention to other  activities, Frequent safety check, rounding, Use of non-skid footwear, Allow  patient and/or family to demo learned strategies to prevent falls/injuries    Response to intervention(s): Pt called 100 % for assistance prior to getting oob  or wlc this shift.    Ongoing Education/Training: Continue safety management strategies above to  prevent falls while in rehab.    PLAN:  Rehab Nursing Specific Interventions:  Continue with the current Nursing  Plan of Care.    TEAM CARE PLAN  Identified problems from team documentation:  Problem: Impaired Cognition  Cognition: Primary Team Goal: Patient will utilize compensatory cognitive  strategies as needed in order to complete routine personal/household management  tasks (meal planning, scheduling appointments, etc.) with 80% accuracy provided  min A./Met    Problem: Impaired Mobility  Mobility: Primary Team Goal: Pt will be mod I for all bed mobility, transfers,  ambulation 350 feet with LRAD, and supervision for two flights of stairs with  single HR./Met    Problem: Impaired Psychosocial Skills/Behavior  PsychoSocial: Primary Team Goal: Patient will regularly employ adaptive coping  skills for adjusting to medical illness, reduced function, and rehab  hospitalization via maintaining adherence with her rehab plan of care, and  expressing positive expectations for increasing her overall future functional  independence./    Problem: Impaired Self-care Mgmt/ADL/IADL  Self Care: Primary Team Goal: Pt will complete total body dressing including  item retrieval at a mod I level using LRAD/Not Met    Please review Integrated Patient View Care Plan Flowsheet for Team identified  Problems, Interventions, and Goals.    Signed by: Lestine Box, RN 08/28/2018 3:40:00 PM

## 2018-08-28 NOTE — Rehab Progress Note ([facility]) (Signed)
[name]  MRN: [id number]  Account: [id number]  Session Start: 08/28/2018 9:00:00 AM  Session Stop: 08/28/2018 10:00:00 AM    Occupational Therapy  Inpatient Rehabilitation Progress Note - Brief    Rehab Diagnosis: CVA  Demographics:            Age: 22Y            Gender: Female  Rehabilitation Precautions/Restrictions:   DM  diet consistent carbs  skin  Risk for Fall, seizure precautions, aspiration    SUBJECTIVE  Patient Report: "Can you tell my daughter about the medication management we did  yesterday?"  Pain: Patient currently without complaints of pain.    OBJECTIVE  Vital Signs:                       Before Activity              After Activity  Vitals  Time                 924                          949  Position/Activity    sitting                      sitting  BP Systolic          122                          122  BP Diastolic         73                           71  Pulse                70                           72    Interventions:       Therapeutic Activities:  Received sitting in w/c, alert and agreeable.  Daughter present for family training.  Educated daughter regarding role of OT in relation to her mother's functional  independence. Discussed her current level of functioning, remaining deficits  (i.e. L sided weakness, balance and memory) and expected SPV needs for higher  level cognitive tasks and during showering to ensure safety. Daughter receptive.    Reviewed memory strategies during medication management with daughter per pt's  request including flipping over bottles once filled and use of alarm clock on  cell phone as reminder to take medication. Discussed slow reintegration of  patient into IADLs and use of energy conservation strategies during those tasks.    Pt demonstrated to her daughter her gait level transfers to shower stall with  seat in place and standard height toilet with safety frame using her RW.  Completed all transfers at a mod I level. Provided info re where to  self  purchase toilet safety frame for home use.  Engaged in gait level item retrieval of items placed within cabinets, drawers  and closets simulating retrieving ADL supplies and items within her kitchen.  Using the RW, completed task with casual SPV.  Post test of L grip strength performed using Dynamometer. See results below.  Demonstrated use of theraputty and HEP to continue to address strength deficits  at home.  Returned to room with needs in place  Patient was left seated in chair in his/her room.  Patient was left seated in chair with family/friends present. Handoff to nurse  completed.  Oriented to call bell and placed within reach.  Personal items within reach.  Assistive devices positioned out of reach.    Education Provided:    Education Provided: Plan of care. Rehab techniques and procedures. role of OT  Activities of daily living. Functional transfers. Safety. Equipment. Home  exercise/activity plan. Home management activities.       Audience: Patient and Caregiver       Mode: Explanation.  Demonstration.  Teacher, English as a foreign language provided.       Response: Verbalized understanding.  Demonstrated skill.    ASSESSMENT  Pt's daughter participated in family training during today's session in prep for  upcoming home d/c tomorrow. Her daughter was receptive to all DME needs,  providing initial set-up/SPV during ADL/IADLs at home and recommended adaptive  home set-up.  Pt continues to present with decreased L grip strength averaging 15.23 via  Dynamometer but does demonstrate an increase relative to initial evaluation  where she averaged 12.9  On track for home d/c tomorrow. Continue with OT POC.      Grip- Jamar Dynamometer: Grip strength compared to norms (Trial Results):    Left Trial #1- 15.3 pounds.    Left Trial #2- 15.6 pounds.    Left Trial #3- 14.8 pounds.    Left Grip Average: 15.23 pounds.   Left average for age and gender is 45.7 pounds.      CARE Tool  Self-Care Functional Assessment  Eating: 06 -  Independent:  Patient completed the activities by him/herself with  no assistance from a helper and without use of assistive devices.  Oral Hygiene: 06 - Independent:  Patient completed the activities by him/herself  with no assistance from a helper and without use of assistive devices.  Toileting Hygiene: 06 - Independent:  Patient completed the activities by  him/herself with no assistance from a helper and with use of assistive devices.  Toileting Equipment:   Toilet,  with safety frame  Assistive Device(s):   Adaptive device to maintain balance  Shower/Bathe Self: 04 - Supervision or touching assistance:  Helper provides  verbal cues and/or touching/steadying and/or contact guard assistance as patient  completes activity.  Location:  Shower.  Assistance Provided:   Stand by assistance  Assistive Device(s):   Grab bar/arm rest to maintain balance, Hand held shower  Upper Body Dressing: Dressing (putting on clothing) was observed/assessed.   Independent:  Patient completed the activities by him/herself with no  assistance from a helper and without use of assistive devices.  Lower Body Dressing: Dressing (putting on clothing) was observed/assessed.  Independent:  Patient completed the activities by him/herself with no assistance  from a helper and with use of assistive devices.  Assistive Device(s):   None  Putting On/Taking Off Footwear: Putting on and taking off footwear were  observed/assessed. 06 - Independent:  Patient completed the activities by  him/herself with no assistance from a helper and without use of assistive  devices.    Mobility Functional Assessment  Toilet Transfer: 06 - Independent:  Patient completed the activities by  him/herself with no assistance from a helper and with use of assistive devices.  Assistive Device(s):   Walker, Safety frame    PLAN  Continued Occupational Therapy is recommended.  Recommended Frequency/Duration/Intensity: 60-120 minutes per day; 5-6x a week;  one on one and  group therapy as appropriate  Continued Activities Contributing Toward Care Plan: NMRE, ADL retraining,  transfer training, therapeutic activities, therapeutic exercise, DME assessment,  IADL retraining, stroke education and d/c planning    3 Hour Rule Minutes: 60 minutes of OT treatment this session count towards  intensity and duration of therapy requirement. Patient was seen for the full  scheduled time of OT treatment this session.  Therapy Mode Minutes: Individual: 60 minutes.    Signed by: Merrilee Jansky, MOTS, OTR/L 08/28/2018 10:00:00 AM

## 2018-08-28 NOTE — Progress Notes (Signed)
Home Health Referral          Referral from [facility]  (Case Manager) for home health care upon discharge.    By [employer], the patient has the right to freely choose a home care provider.  Arrangements have been made with:     A company of the patients choosing. We have supplied the patient with a listing of providers in your area who asked to be included and participate in Medicare.   The preferred provider of your insurance company. Choosing a home care provider other than your insurance company's preferred provider may affect your insurance coverage.        Home Health Discharge Information     Your doctor has ordered Skilled Nursing, Physical Therapy, Occupational Therapy and Speech and Language Therapy in-home service(s) for you while you recuperate at home, to assist you in the transition from hospital to home.      The agency that you or your representative chose to provide the service:  Name of Home Health Agency Placement: [facility]] [phone]          Home health services were set up by:  Simmie Davies  (Post Acute Care Coordinator for home health)   Phone [phone]    Additional comments:   IF YOU HAVE NOT HEARD FROM YOUR HOME YOUR HOME HEALTH AGENCY WITHIN 24-48 HOURS AFTER DISCHARGE PLEASE CALL YOUR AGENCY TO ARRANGE A TIME FOR YOUR FIRST VISIT. FOR ANY SCHEDULING CONCERNS OR QUESTIONS RELATED TO HOME HEALTH, SUCH AS TIME OR DATE PLEASE CONTACT YOUR HOME HEALTH AGENCY AT THE NUMBER LISTED ABOVE.    HOME HEALTH REFERRAL  Educated patient  on Home Care and services available. Patient  agreeable to [facility] PT OT SLP services with [facility]      PATIENT"S DEMOGRAPHICS:        Name: [name]    Discharge Address: [address]  [place] [address]      Primary Telephone Number: [phone]   Secondary Telephone Number: [phone] Dtr [name]  Emergency Contact and Number: Extended Emergency Contact Information  Primary Emergency Contact: Maureen Chatters  Address: [address]            [place], [place] [address] [place]  Home Phone: [phone]  Mobile Phone: [phone]  Relation: Daughter        Ordering Physician: Dr Hendricks Limes    Following Physician: Dr [name]     PCP: Malcolm Metro, MD, [phone]    Agreeable to Follow: Yes  Date/Time of Call: 08/28/2018 2:00 Message left    Language/Communication Barrier:    No    Primary Diagnosis and Reason for Services:    Post CVA        Hi-[employer] (Labs, Wounds, Infusions, etc.):  None         Additional Comments:  SN PT OT SLP eval and treat Post CVA      Discharge Date:   Referral Source (PACC/Hospital/Unit): Simmie Davies  Referral Date: 08/28/18    Home Health face-to-face (FTF) Encounter (Order [id number])   Consult   Date: 08/28/2018 Department: [facility] Ordering/Authorizing: Earl Many, DO   Order Information     Order Date/Time Release Date/Time Start Date/Time End Date/Time   08/28/18 01:55 PM None 08/28/18 01:52 PM 08/28/18 01:52 PM   Order Details     Frequency Duration Priority Order Class  Once 1 occurrence Routine Hospital Performed   Standing Order Information     Remaining Occurrences Interval Last Released      0/1 Once 08/28/2018            Provider Information     Ordering User Ordering Provider Authorizing Provider   Waynard Edwards, DO Earl Many, DO   Attending Provider(s) Admitting Provider PCP   Earl Many, DO Claris Gower, MD   Verbal Order Info     Action Created on Order Mode Entered by Responsible Provider Signed by Signed on   Ordering 08/28/18 1355 Telephone with readback Waynard Edwards, DO     Comments     Diagnosis     CVA (cerebral vascular accident) I63.9       Home nursing required for skilled assessment including cardiopulmonary assessment and dietary education for disease management, and medication instruction. Home PT/OT required for gait and balance  training, strengthening, mobility, fall prevention, and ADL training.         Order Questions     Question Answer Comment   Date of face-to-face (FTF) encounter: 08/28/2018    Medical conditions that necessitate Home Health care: B. Functional impairment due to recent hospitalization/procedure/treatment     C. Risk for complication/infection/pain requiring follow up and monitoring     E. Exacerbation of disease requiring follow up monitoring    Clinical findings that support the need for Skilled Nursing. SN will: C. Monitor for signs and symptoms of exacerbation of disease and management     D. Review medication reconciliation, manage and educate on use and side effects     G. Educate on new diagnosis, treatment & management to prevent re-hospitalization     H. Assess cardiopulmonary status and monitor for signs &symptoms of exacerbation     I. Educate dietary and or fluid restrictions and weight management    Clinical findings that support the need for Physical Therapy. PT will A. Evaluate and treat functional impairment and improve mobility     C. Educate on weight bearing status, stair/gait training, balance & coordination     D. Provide services to help restore function, mobility, and releive pain     E. Educate on functional mobility; bed, chair, sit, stand and transfer activities     F. Perform home safety assessment & develop safe in home exercise program     G. Implement activities to improve stance time, cadence & step length    Clinical findings support the need for OT (needs SN/PT order).OT will A. Develop in home program to improve ability to perform ADLs     B. Develop restorative program to improve mobility and independence     C. Educate on recovery and maintenance skills     D. Instruct on strategies to compensate for loss of function     E. Educate on basic motor function and reasoning abilities    Clinical findings that support the need for SLP. ST will A. Evaluate & treat speech,  language, cognitive, communication & swallowing impair     C. Educate on communication deficit & develop communication plan based on deficits     D. Educate on alterations and compensation strategies due to aphasia & dysphasia    Per clinical findings, following services are medically necessary: Skilled Nursing     PT     OT     Speech Language Pathology    Evidence this  patient is homebound because: C. Decreased endurance, strength, ROM, cadence, safety/judgment during mobility     E. Requires assistance of another person or assistive device to ambulate > 5 feet     F. Deconditioned due to advance disease process requiring assistance to leave home     G. Fall risk due to impaired coordination, gait and decreased balance     L. Transfer/ambulation requires stand by assist and verbal cues to perform safely     M. Unsteady gait, poor ambulation with history of falls     N. Impaired mobility d/t pain, arthritis, weakness that compromises patient safety          Process Instructions     Please select Home Care Services medically necessary.     Based on the above findings, I certify that this patient is confined to the home and needs intermittent skilled nursing care, physical therapry and / or speech therapy or continues to need occupational therapy. The patient is under my care, and I have initiated the establishment of the plan of care. This patient will be followed by a physician who will periodically review the plan of care.    Collection Information     Consult Order Info     ID Description Priority Start Date Start Time   [id number] Home Health face-to-face (FTF) Encounter Routine 08/28/2018 1:52 PM   Provider Specialty Referred to   ______________________________________ _____________________________________   Acknowledgement Info     For At Acknowledged By Acknowledged On   Placing Order 08/28/18 1355 Simmie Davies 08/28/18 1355   Verbal Order Info     Action Created on Order Mode Entered by  Responsible Provider Signed by Signed on   Ordering 08/28/18 1355 Telephone with Andria Rhein, Letta Pate, DO     Patient Information     Patient Name  Richard, Holz Sex  Female DOB  1956/05/08   Additional Information     Associated Reports External References   Priority and Order Details [employer]           Signed by: Simmie Davies  Date Time: 08/28/18 1:56 PM

## 2018-08-29 LAB — CBC
Absolute NRBC: 0 10*3/uL (ref 0.00–0.00)
Hematocrit: 31.5 % — ABNORMAL LOW (ref 34.7–43.7)
Hgb: 9.6 g/dL — ABNORMAL LOW (ref 11.4–14.8)
MCH: 25.7 pg (ref 25.1–33.5)
MCHC: 30.5 g/dL — ABNORMAL LOW (ref 31.5–35.8)
MCV: 84.5 fL (ref 78.0–96.0)
MPV: 9.6 fL (ref 8.9–12.5)
Nucleated RBC: 0 /100 WBC (ref 0.0–0.0)
Platelets: 291 10*3/uL (ref 142–346)
RBC: 3.73 10*6/uL — ABNORMAL LOW (ref 3.90–5.10)
RDW: 14 % (ref 11–15)
WBC: 5.76 10*3/uL (ref 3.10–9.50)

## 2018-08-29 LAB — COMPREHENSIVE METABOLIC PANEL
ALT: 9 U/L (ref 0–55)
AST (SGOT): 12 U/L (ref 5–34)
Albumin/Globulin Ratio: 1.2 (ref 0.9–2.2)
Albumin: 3.4 g/dL — ABNORMAL LOW (ref 3.5–5.0)
Alkaline Phosphatase: 71 U/L (ref 37–106)
Anion Gap: 10 (ref 5.0–15.0)
BUN: 10 mg/dL (ref 7–19)
Bilirubin, Total: 0.3 mg/dL (ref 0.2–1.2)
CO2: 27 mEq/L (ref 22–29)
Calcium: 9.1 mg/dL (ref 8.5–10.5)
Chloride: 105 mEq/L (ref 100–111)
Creatinine: 0.9 mg/dL (ref 0.6–1.0)
Globulin: 2.9 g/dL (ref 2.0–3.6)
Glucose: 93 mg/dL (ref 70–100)
Potassium: 3.9 mEq/L (ref 3.5–5.1)
Protein, Total: 6.3 g/dL (ref 6.0–8.3)
Sodium: 142 mEq/L (ref 136–145)

## 2018-08-29 LAB — MAGNESIUM: Magnesium: 2 mg/dL (ref 1.6–2.6)

## 2018-08-29 LAB — GFR: EGFR: >60

## 2018-08-29 MED ORDER — SUCRALFATE 1 G PO TABS
1.00 g | ORAL_TABLET | Freq: Four times a day (QID) | ORAL | 0 refills | Status: AC
Start: 2018-08-29 — End: 2018-09-28

## 2018-08-29 MED ORDER — TRAMADOL HCL 50 MG PO TABS
50.00 mg | ORAL_TABLET | Freq: Two times a day (BID) | ORAL | 0 refills | Status: AC | PRN
Start: 2018-08-29 — End: 2018-09-05

## 2018-08-29 MED ORDER — PANTOPRAZOLE SODIUM 40 MG PO TBEC
40.00 mg | DELAYED_RELEASE_TABLET | Freq: Every day | ORAL | 0 refills | Status: AC
Start: 2018-08-29 — End: ?

## 2018-08-29 MED ORDER — PANTOPRAZOLE SODIUM 40 MG PO TBEC
40.00 mg | DELAYED_RELEASE_TABLET | Freq: Two times a day (BID) | ORAL | 0 refills | Status: DC
Start: 2018-08-29 — End: 2018-08-29

## 2018-08-29 MED ORDER — LAMOTRIGINE 150 MG PO TABS
150.00 mg | ORAL_TABLET | Freq: Two times a day (BID) | ORAL | 0 refills | Status: DC
Start: 2018-08-29 — End: 2022-01-03

## 2018-08-29 MED ORDER — DABIGATRAN ETEXILATE MESYLATE 150 MG PO CAPS
150.00 mg | ORAL_CAPSULE | Freq: Two times a day (BID) | ORAL | 0 refills | Status: AC
Start: 2018-08-29 — End: 2018-09-28

## 2018-08-29 MED ORDER — QUETIAPINE FUMARATE 50 MG PO TABS
50.00 mg | ORAL_TABLET | Freq: Every evening | ORAL | 0 refills | Status: DC
Start: 2018-08-29 — End: 2022-01-26

## 2018-08-29 MED ORDER — METOPROLOL SUCCINATE ER 25 MG PO TB24
25.00 mg | ORAL_TABLET | Freq: Every day | ORAL | 0 refills | Status: AC
Start: 2018-08-30 — End: 2018-09-29

## 2018-08-29 MED ORDER — ROPINIROLE HCL 1 MG PO TABS
1.00 mg | ORAL_TABLET | Freq: Every evening | ORAL | 0 refills | Status: AC
Start: 2018-08-29 — End: ?

## 2018-08-29 MED ORDER — PSYLLIUM 58.12 % PO PACK
1.00 | PACK | Freq: Every day | ORAL | 0 refills | Status: DC
Start: 2018-08-30 — End: 2019-05-11

## 2018-08-29 MED ORDER — ATORVASTATIN CALCIUM 40 MG PO TABS
40.00 mg | ORAL_TABLET | Freq: Every evening | ORAL | 0 refills | Status: AC
Start: 2018-08-29 — End: 2018-09-28

## 2018-08-29 MED ORDER — FUROSEMIDE 20 MG PO TABS
20.00 mg | ORAL_TABLET | Freq: Every day | ORAL | 0 refills | Status: DC
Start: 2018-08-30 — End: 2022-01-03

## 2018-08-29 MED ORDER — ACETAMINOPHEN 325 MG PO TABS
650.0000 mg | ORAL_TABLET | ORAL | 0 refills | Status: AC | PRN
Start: 2018-08-29 — End: ?

## 2018-08-29 MED ORDER — BUTALBITAL-APAP-CAFFEINE 50-325-40 MG PO TABS
1.00 | ORAL_TABLET | Freq: Four times a day (QID) | ORAL | 0 refills | Status: DC | PRN
Start: 2018-08-29 — End: 2019-05-11

## 2018-08-29 NOTE — Rehab Discharge Instruction ([facility]) (Signed)
[name]  MRN: [id number]  Account: [id number]  Session Start: 08/29/2018 12:00:00 AM  Session Stop: 08/29/2018 12:00:00 AM    Case Management  Inpatient Rehabilitation Discharge Instructions    Discharge Date: Wednesday, August 29, 2018  Transportation: Family via personal car  Discharge Plan: Home with Home Health    These instructions have information for your HOME HEALTH AGENCY and FOLLOW-UP  APPOINTMENTS    Home Health Referral    Referral from [facility]  (Case Manager) for home health care upon discharge.    By [employer], the patient has the right to freely choose a home care provider.  Arrangements have been made with:    ? A company of the patients choosing. We have supplied the patient with a  listing of providers in your area who asked to be included and participate in  Medicare.  ? The preferred provider of your insurance company. Choosing a home care  provider other than your insurance company's preferred provider may affect your  insurance coverage.    Home Health Discharge Information     Your doctor has ordered Skilled Nursing, Physical Therapy, Occupational Therapy  and Speech and Language Therapy in-home service(s) for you while you recuperate  at home, to assist you in the transition from hospital to home.      The agency that you or your representative chose to provide the service:  Name of Home Health Agency Placement: [facility] [phone]    Home health services were set up by:  Simmie Davies  (Post Acute Care Coordinator for home health)   Phone [phone]    Additional comments:  IF YOU HAVE NOT HEARD FROM YOUR HOME YOUR HOME HEALTH AGENCY WITHIN 24-48 HOURS  AFTER DISCHARGE PLEASE CALL YOUR AGENCY TO ARRANGE A TIME FOR YOUR FIRST VISIT.  FOR ANY SCHEDULING CONCERNS OR QUESTIONS RELATED TO HOME HEALTH, SUCH AS TIME OR  DATE PLEASE CONTACT YOUR HOME HEALTH AGENCY AT THE NUMBER LISTED ABOVE.          Follow-up Appointment(s):    Malcolm Metro, MD (Primary Care)  [facility]  [address] [place] [address]  Phone: [phone]  Appointment: Monday, September 10, 2018 at 2:30pm; Please arrive 15 minutes  before your scheduled appointment. Bring your insurance information, all  medications you are currently taking and any appropriate x-rays or test results.        Sherryll Burger, MD (Neurology)  [facility]  [address] [place] [address]  Phone: [phone]  Appointment: scheduled appointment for 1-2 weeks after discharge from rehab    Renotification of Medicare Important Message: The Renotification of Medicare  Important Message letter was issued.    Community Resources: 1) Disabled Research scientist (life sciences) for [employer] 2) Glucometer  machine  Additional Information: Any questions call Kellie Shropshire, MSW, ACM-SW  [phone]    Signed by: Kellie Shropshire, MSW 08/29/2018 11:02:00 AM

## 2018-08-29 NOTE — Rehab Discharge Summary ([facility]) (Signed)
NAMEORLI [name]  MRN: [id number]  Account: [id number]  Session Start: 08/29/2018 12:00:00 AM  Session Stop: 08/29/2018 12:00:00 AM    Case Management  Inpatient Rehabilitation Discharge Summary    Rehab Diagnosis: CVA  Demographics:            Age: 62Y            Gender: Female    Past Medical History: A/Fib  HTN  HLD  DM  Migraine  Seizure  Obesity  Syncope  History of Present Illness: Per medical record review, "[[name] is a] 62  YO F admitted with headache and left side weakness, slurred speech, chills and  blurry vision.CT Negative could not do MRI r/t pt with loop recorder.  Neurologist said most likely CVA. Patient with HX of seizures last one 1.5  months ago. Obese, DM which is controlled and HTn that is controlled. Patient is  on aspiration precautions on a Mechanical chopped diet with thin liquids and is  working with PT and OT. Patient was I prior to admission and is now below her  baseline, would benefit from inpatient AR prior to going home with family."   Date of Onset:  08/18/18   Date of Admission: 08/21/2018 6:06:19 PM    Discharge Date: Wednesday, August 29, 2018  Transportation: Family via personal car  Discharge Plan: Home with Home Health  Community Resources: 1) Disabled placard application for [employer] 2) Glucometer  machine    These instructions have information for your HOME HEALTH AGENCY and FOLLOW-UP  APPOINTMENTS    Home Health Referral    Referral from [facility]  (Case Manager) for home health care upon discharge.    By [employer], the patient has the right to freely choose a home care provider.  Arrangements have been made with:    ? A company of the patients choosing. We have supplied the patient with a  listing of providers in your area who asked to be included and participate in  Medicare.  ? The preferred provider of your insurance company. Choosing a home care  provider other than your insurance company's preferred provider may affect your  insurance coverage.    Home Health  Discharge Information     Your doctor has ordered Skilled Nursing, Physical Therapy, Occupational Therapy  and Speech and Language Therapy in-home service(s) for you while you recuperate  at home, to assist you in the transition from hospital to home.      The agency that you or your representative chose to provide the service:  Name of Home Health Agency Placement: [facility] [phone]    Home health services were set up by:  Simmie Davies  (Post Acute Care Coordinator for home health)   Phone [phone]    Additional comments:  IF YOU HAVE NOT HEARD FROM YOUR HOME YOUR HOME HEALTH AGENCY WITHIN 24-48 HOURS  AFTER DISCHARGE PLEASE CALL YOUR AGENCY TO ARRANGE A TIME FOR YOUR FIRST VISIT.  FOR ANY SCHEDULING CONCERNS OR QUESTIONS RELATED TO HOME HEALTH, SUCH AS TIME OR  DATE PLEASE CONTACT YOUR HOME HEALTH AGENCY AT THE NUMBER LISTED ABOVE.      Follow-up Appointment(s):    Malcolm Metro, MD (Primary Care)  [facility]  [address] [place] [address]  Phone: [phone]  Appointment: Monday, September 10, 2018 at 2:30pm; Please arrive 15 minutes  before your scheduled appointment. Bring your insurance information, all  medications you  are currently taking and any appropriate x-rays or test results.        Sherryll Burger, MD (Neurology)  [facility]  [address] [place] [address]  Phone: [phone]  Appointment: scheduled appointment for 1-2 weeks after discharge from rehab      Education Provided:  No education provided this session.    Renotification of Medicare Important Message: The Renotification of Medicare  Important Message letter was issued.    Care Plan  Identified problems from team documentation:  Problem: Impaired Bladder Management    Problem: Impaired Bowel Management    Problem: Impaired Cognition  Cognition: Primary Team Goal: Patient will utilize compensatory cognitive  strategies as needed in order to complete routine  personal/household management  tasks (meal planning, scheduling appointments, etc.) with 80% accuracy provided  min A./Met    Problem: Impaired Mobility  Mobility: Primary Team Goal: Pt will be mod I for all bed mobility, transfers,  ambulation 350 feet with LRAD, and supervision for two flights of stairs with  single HR./Met    Problem: Impaired Pain Management    Problem: Impaired Psychosocial Skills/Behavior  PsychoSocial: Primary Team Goal: Patient will regularly employ adaptive coping  strategies to assist with mood regulation. /Met    Problem: Impaired Self-care Mgmt/ADL/IADL  Self Care: Primary Team Goal: Pt will complete total body dressing including  item retrieval at a mod I level using LRAD/Not Met    Problem: Safety Risk and Restraint    Status update for discharge:      Please review Integrated Patient View Care Plan Flowsheet for Team identified  Problems, Interventions, and Goals.    Signed by: Kellie Shropshire, MSW 08/29/2018 11:03:00 AM

## 2018-08-29 NOTE — Progress Notes (Signed)
MEDICINE PROGRESS NOTE  [facility], DIVISION OF HOSPITALIST MEDICINE   [facility]   [place] Pager: [phone]      Date Time: 08/28/2018  2:09 pm  Patient Name: [name]  Attending Physician: Earl Many, DO  Hospital Day: 9  Assessment:     Active Hospital Problems    Diagnosis   . Chest pain   . CVA (cerebral vascular accident)       62 year old female with past medical history significant for hypertension, asthma, hyperlipidemia, diet-controlled diabetes, seizure, who was presented left sided weakness after recent presumed stroke     Plan:     Atypical chest pain- Resolved, non cardiac   No EKG changes  Troponin negative  Per Dr Lucianne Muss,  her cardiologist on 08/22/18, has had extensive cardiac work up, no need for further testing at this time  Chest xray with no acute findings     Left sided weakness/numbness  Presented with presumed CVA form OSH  Currently on Pradaxa  MRI done, no evidence of CVA  Continue statin  Evaluated by cardiology       Paroxymsmal Afib  Currently in sinus  S/p linq recorder with medtronic              Confirmed with Dr Lucianne Muss office  On pradaxa   Continue metoprolol    Asthma  Not in exacerbation, mild wheeze will request nebulizer treatment  Continue inhalers    Headache  Complaining of mild headache  Fiorcet prn     HLD  Continue statin    HTN  stable  On metoprolol    Diet controlled diabetes    Pysch:  Continue home psych meds    DVT ppx: on pradaxa    Discussed with: patient, staff  Discharge planning for tomorrow    Will follow intermittently, please feel free to call with any question or concerns        Subjective     CC: CVA (cerebral vascular accident)    Diet mechanical soft Liquid consistency: Thin; Additional restrictions: Consistent carbohydrate, Cardiac  NUTRITION DISLIKES Frequency: All meals; Preference: No PEAS  HPI/Subjective: No acute events overnight, no chest pain, no sob ,   Review of Systems:   Review of Systems -  Negative except as above in HPI    Physical Exam:     Temp:  [98.4 F (36.9 C)-99 F (37.2 C)] 98.4 F (36.9 C)  Heart Rate:  [69-82] 71  Resp Rate:  [14-16] 16  BP: (114-122)/(70-75) 122/75  No intake or output data in the 24 hours ending 08/29/18 0609 General: awake, alert ,in no acute distress  Cardiovascular: regular rate and rhythm, no murmurs  Lungs: clear to auscultation bilaterally, no additional sounds  Abdomen: soft, non-tender, non-distended; normoactive bowel sounds  Extremities: no edema  Neurological: Alert and oriented X 3, moves all extremities.            Meds:   Medications were reviewed:  Scheduled Meds:  Current Facility-Administered Medications   Medication Dose Route Frequency   . atorvastatin  40 mg Oral QHS   . dabigatran  150 mg Oral Q12H SCH   . fluticasone furoate-vilanterol  1 puff Inhalation QAM   . furosemide  20 mg Oral Daily   . lamoTRIgine  150 mg Oral BID   . lidocaine  1 patch Transdermal Q24H   . Lurasidone HCl  40 mg Oral QHS   . metoprolol succinate XL  25 mg Oral  Daily   . pantoprazole  40 mg Oral BID AC   . psyllium  1 packet Oral Daily   . QUEtiapine  50 mg Oral QHS   . rOPINIRole  1 mg Oral QHS   . sucralfate  1 g Oral TID AC & HS     Continuous Infusions:  PRN Meds:.acetaminophen, albuterol, bisacodyl, butalbital-acetaminophen-caffeine, diphenhydrAMINE, lactulose, ondansetron, traMADol  Labs/Radiology:   Imaging personally reviewed, including: all available   No results found.  No results for input(s): GLUCOSEWB in the last 24 hours.        Invalid input(s): CO              This note was generated by the Epic EMR system/ Dragon speech recognition and may contain inherent errors or omissions not intended by the user. Grammatical errors, random word insertions, deletions and pronoun errors  are occasional consequences of this technology due to software limitations. Not all errors are caught or corrected. If there are questions or concerns about the content of this note or  information contained within the body of this dictation they should be addressed directly with the author for clarification.    Signed by: Laretta Bolster, MD

## 2018-08-29 NOTE — Discharge Summary (Signed)
REHABILITATION MEDICINE DISCHARGE SUMMARY    Patient Identification  Lilyanah Celestin is a 62 y.o. female.  DOB:  1956/09/20    Date of admission: 08/21/2018    Date of discharge: 08/29/18    Attending Provider: No att. providers found       Discharge Physician:  Earl Many    Admission Diagnoses: CVA (cerebral vascular accident) [I63.9]  HPI:  This 62 y.o. year old female with history of hypertension, hyperlipidemia, asthma, DM, 2, seizures was admitted to [facility] hospital on 08/18/2018 with complaints of slurred speech, left-sided weakness .  CT of the head was unremarkable..  Patient was evaluated by neurologist for likely stroke.  She is on mechanical soft/thin liquids diet  On 08/21/2018, the patient was deemed medically stable to be discharged to acute inpatient rehabilitation.     Hospital Course:  The patient underwent comprehensive rehabilitation including   Physical therapy 60-120 min daily, 5-6 times per week   Occupational therapy 60-120 min daily, 5-6 times per week   Speech therapy 60-120 min daily, 5-6 times per week   Case management   Rehabilitation nursing    The following issues were addressed:  Constipation: Continue Metamucil, Dulcolax as needed, continue lactulose as needed  Headaches: Fioricet as needed   HTN monitoring /management: Stable, SBP-120s, continue Lasix, Toprol-XL    History of bipolar disorder with psychotic features: Per patient and her daughter request,  Kasandra Knudsen  was changed to 40 mg; current home dose    Myofascial pain: resolved    Paroxysmal A. fib: Continue Pradaxa for stroke prevention    Prevention of skin breakdown: Frequent repositioning  Pain Follow-up: tylenol PRN  DVT ZOX:WRUEAVW  Dyslipidemia:diet,statin    Discharge Exam:  Vitals:    08/28/18 0949 08/28/18 1623 08/29/18 0542 08/29/18 0911   BP: 122/71 115/70 122/75 109/74   Pulse: 72 69 71 96   Resp:  16 16    Temp:  98.6 F (37 C) 98.4 F (36.9 C)     TempSrc:  Oral Oral    SpO2:  99% 99%    Weight:       Height:         General appearance:In no acute distress.    UJ:WJXBJ rate and rhythm are regular. No murmurs/rubs/gallops.  Pulm:No wheezes, rales, or rhonchi.  Clear breath sounds bilaterally  YNW:GNFA. Non-tender. Hypoactive bowel sounds.  Neuro: Awake, , fluent speech  Face is symmetric and without weakness.   Hearing intact bilaterally to conversational speech.   IMusculoskeletal:   ROM-within functional limits    Manual muscle strength testing:  Muscle group Left Right   Shoulder abductors 3 5   Elbow flexors 4 5   Elbow extensors 4 5   Finger flexors 5 5   Hip flexors 4 4   Knee flexors 4 5   Knee extensors 4 5   Ankle dorsiflexors 4 5   Ankle plantarflexors 5 5   EHL 4 5     Extremities: no calf tenderness    Labs:    BMP:  Lab Results   Component Value Date    WBC 5.76 08/29/2018    HGB 9.6 (L) 08/29/2018    HCT 31.5 (L) 08/29/2018    MCV 84.5 08/29/2018    PLT 291 08/29/2018     Lab Results   Component Value Date    BUN 10 08/29/2018     Lab Results   Component Value Date    CREAT 0.9 08/29/2018  Lab Results   Component Value Date    NA 142 08/29/2018    K 3.9 08/29/2018    CL 105 08/29/2018    CO2 27 08/29/2018     Lab Results   Component Value Date    ALT 9 08/29/2018    AST 12 08/29/2018    ALKPHOS 71 08/29/2018    BILITOTAL 0.3 08/29/2018     Lab Results   Component Value Date    PROT 6.3 08/29/2018    ALB 3.4 (L) 08/29/2018     No results found for: INR, PT    No results found for: CHOL  No results found for: HDL  No results found for: LDL  No results found for: TRIG    No results found for: HGBA1C    Imaging reports reviewed:  Xr Chest 2 Views    Result Date: 08/22/2018   No active disease Kinnie Feil, MD 08/22/2018 4:30 PM    Mri Brain W Wo Contrast    Result Date: 08/22/2018  1.  Diffusion weighted imaging does not show definite evidence of acute or subacute infarct. 2.  Foci of hyperintense T2 and FLAIR signal are seen in the  white matter of the cerebral hemispheres. This is a nonspecific finding. It may be related to chronic ischemic change from small vessel disease. 3.  No abnormal enhancing lesions are seen in the brain following contrast administration. Tana Felts, MD 08/22/2018 11:59 PM        Recent Labs     08/29/18  9604   Sodium 142   Potassium 3.9   Chloride 105   CO2 27   BUN 10   Creatinine 0.9   Glucose 93   Calcium 9.1   Magnesium 2.0       Estimated Creatinine Clearance: 76.5 mL/min (based on SCr of 0.9 mg/dL).    LFTs:  Recent Labs     08/29/18  0608   AST (SGOT) 12   ALT 9   Alkaline Phosphatase 71   Bilirubin, Total 0.3   Protein, Total 6.3   Albumin 3.4*         CBC:  Recent Labs     08/29/18  0608   WBC 5.76   Hgb 9.6*   Hematocrit 31.5*   MCV 84.5   Platelets 291     Rads:   Radiological Procedure reviewed.  Radiology Results (24 Hour)     ** No results found for the last 24 hours. **        Radiology: all results in the last 7 days  Xr Chest 2 Views    Result Date: 08/22/2018   No active disease Kinnie Feil, MD 08/22/2018 4:30 PM    Mri Brain W Wo Contrast    Result Date: 08/22/2018  1.  Diffusion weighted imaging does not show definite evidence of acute or subacute infarct. 2.  Foci of hyperintense T2 and FLAIR signal are seen in the white matter of the cerebral hemispheres. This is a nonspecific finding. It may be related to chronic ischemic change from small vessel disease. 3.  No abnormal enhancing lesions are seen in the brain following contrast administration. Tana Felts, MD 08/22/2018 11:59 PM        Discharge Functional Status:        DIET      Prescription drug monitoring program review:      Discharge Medication List as of 08/29/2018 11:13 AM  START taking these medications    Details   acetaminophen (TYLENOL) 325 MG tablet Take 2 tablets (650 mg total) by mouth every 4 (four) hours as needed for Pain, Starting Wed 08/29/2018, Print      atorvastatin (LIPITOR) 40 MG tablet Take 1 tablet (40 mg  total) by mouth nightly, Starting Wed 08/29/2018, Until Fri 09/28/2018, Print      butalbital-acetaminophen-caffeine (FIORICET, ESGIC) 50-325-40 MG per tablet Take 1 tablet by mouth every 6 (six) hours as needed for Headaches, Starting Wed 08/29/2018, Print      dabigatran (PRADAXA) 150 MG Cap Take 1 capsule (150 mg total) by mouth every 12 (twelve) hours, Starting Wed 08/29/2018, Until Fri 09/28/2018, Print      furosemide (LASIX) 20 MG tablet Take 1 tablet (20 mg total) by mouth daily, Starting Thu 08/30/2018, Print      lamoTRIgine (LAMICTAL) 150 MG tablet Take 1 tablet (150 mg total) by mouth 2 (two) times daily, Starting Wed 08/29/2018, Print      metoprolol succinate XL (TOPROL-XL) 25 MG 24 hr tablet Take 1 tablet (25 mg total) by mouth daily, Starting Thu 08/30/2018, Until Sat 09/29/2018, Print      psyllium (METAMUCIL) 58.12 % Pack packet Take 1 packet by mouth daily, Starting Thu 08/30/2018, Print      QUEtiapine (SEROQUEL) 50 MG tablet Take 1 tablet (50 mg total) by mouth nightly, Starting Wed 08/29/2018, Print      rOPINIRole (REQUIP) 1 MG tablet Take 1 tablet (1 mg total) by mouth nightly, Starting Wed 08/29/2018, Print      sucralfate (CARAFATE) 1 g tablet Take 1 tablet (1 g total) by mouth 4 times daily - with meals and at bedtime, Starting Wed 08/29/2018, Until Fri 09/28/2018, Print      traMADol (ULTRAM) 50 MG tablet Take 1 tablet (50 mg total) by mouth 2 (two) times daily as needed for Pain, Starting Wed 08/29/2018, Until Wed 09/05/2018, Print         CONTINUE these medications which have CHANGED    Details   pantoprazole (PROTONIX) 40 MG tablet Take 1 tablet (40 mg total) by mouth daily, Starting Wed 08/29/2018, Print               Current Facility-Administered Medications:   .  acetaminophen (TYLENOL) tablet 650 mg, 650 mg, Oral, Q4H PRN, Shaily Librizzi N, DO, 650 mg at 08/27/18 1148  .  albuterol (PROVENTIL) (2.5 MG/3ML) 0.083% nebulizer solution 2.5 mg, 2.5 mg, Nebulization, Q6H PRN, Marcanthony Sleight N, DO,  2.5 mg at 08/29/18 0405  .  atorvastatin (LIPITOR) tablet 40 mg, 40 mg, Oral, QHS, Dondrea Clendenin N, DO, 40 mg at 08/28/18 2122  .  bisacodyl (DULCOLAX) suppository 10 mg, 10 mg, Rectal, Daily PRN, Dejae Bernet N, DO, 10 mg at 08/23/18 1628  .  butalbital-acetaminophen-caffeine (FIORICET, ESGIC) 50-325-40 MG per tablet 1 tablet, 1 tablet, Oral, Q6H PRN, Carley Hammed, MD, 1 tablet at 08/27/18 0309  .  dabigatran (PRADAXA) capsule 150 mg, 150 mg, Oral, Q12H SCH, Rickie Gutierres N, DO, 150 mg at 08/29/18 0902  .  diphenhydrAMINE (BENADRYL) capsule 25 mg, 25 mg, Oral, Q6H PRN, Benjamin-Allen, Samantha A, DO, 25 mg at 08/29/18 0906  .  fluticasone furoate-vilanterol (BREO ELLIPTA) 100-25 MCG/INH 1 puff, 1 puff, Inhalation, QAM, [place] N, DO, 1 puff at 08/29/18 0832  .  furosemide (LASIX) tablet 20 mg, 20 mg, Oral, Daily, Mads Borgmeyer N, DO, 20 mg at 08/29/18 0911  .  lactulose (CHRONULAC)  10 GM/15ML solution 20 g, 20 g, Oral, BID PRN, Shantara Goosby N, DO, 20 g at 08/28/18 0005  .  lamoTRIgine (LaMICtal) tablet 150 mg, 150 mg, Oral, BID, Sina Lucchesi N, DO, 150 mg at 08/29/18 0906  .  lidocaine (LIDODERM) 5 % 1 patch, 1 patch, Transdermal, Q24H, Brianca Fortenberry N, DO, 1 patch at 08/28/18 1802  .  Lurasidone HCl TABS 40 mg, 40 mg, Oral, QHS, Siaosi Alter N, DO, 40 mg at 08/28/18 2122  .  metoprolol succinate XL (TOPROL-XL) 24 hr tablet 25 mg, 25 mg, Oral, Daily, Darlette Dubow N, DO, 25 mg at 08/29/18 0911  .  ondansetron (ZOFRAN-ODT) disintegrating tablet 4 mg, 4 mg, Oral, Q6H PRN, Suhayb Anzalone N, DO, 4 mg at 08/28/18 0700  .  pantoprazole (PROTONIX) EC tablet 40 mg, 40 mg, Oral, BID AC, Laterrance Nauta N, DO, 40 mg at 08/29/18 0906  .  psyllium (METAMUCIL) packet 1 packet, 1 packet, Oral, Daily, Jolee Critcher N, DO, 1 packet at 08/28/18 0818  .  QUEtiapine (SEROquel) tablet 50 mg, 50 mg, Oral, QHS, Nansi Birmingham N, DO, 50 mg at 08/28/18 2122  .  rOPINIRole  (REQUIP) tablet 1 mg, 1 mg, Oral, QHS, Takumi Din N, DO, 1 mg at 08/28/18 2122  .  sucralfate (CARAFATE) tablet 1 g, 1 g, Oral, TID AC & HS, Fred Hammes N, DO, 1 g at 08/29/18 0628  .  traMADol (ULTRAM) tablet 50 mg, 50 mg, Oral, BID PRN, Koa Zoeller N, DO, 50 mg at 08/29/18 0910    Current Outpatient Medications:   .  acetaminophen (TYLENOL) 325 MG tablet, Take 2 tablets (650 mg total) by mouth every 4 (four) hours as needed for Pain, Disp: 50 tablet, Rfl: 0  .  atorvastatin (LIPITOR) 40 MG tablet, Take 1 tablet (40 mg total) by mouth nightly, Disp: 30 tablet, Rfl: 0  .  butalbital-acetaminophen-caffeine (FIORICET, ESGIC) 50-325-40 MG per tablet, Take 1 tablet by mouth every 6 (six) hours as needed for Headaches, Disp: 20 tablet, Rfl: 0  .  dabigatran (PRADAXA) 150 MG Cap, Take 1 capsule (150 mg total) by mouth every 12 (twelve) hours, Disp: 60 capsule, Rfl: 0  .  [START ON 08/30/2018] furosemide (LASIX) 20 MG tablet, Take 1 tablet (20 mg total) by mouth daily, Disp: 30 tablet, Rfl: 0  .  lamoTRIgine (LAMICTAL) 150 MG tablet, Take 1 tablet (150 mg total) by mouth 2 (two) times daily, Disp: 60 tablet, Rfl: 0  .  [START ON 08/30/2018] metoprolol succinate XL (TOPROL-XL) 25 MG 24 hr tablet, Take 1 tablet (25 mg total) by mouth daily, Disp: 30 tablet, Rfl: 0  .  pantoprazole (PROTONIX) 40 MG tablet, Take 1 tablet (40 mg total) by mouth daily, Disp: 30 tablet, Rfl: 0  .  [START ON 08/30/2018] psyllium (METAMUCIL) 58.12 % Pack packet, Take 1 packet by mouth daily, Disp: 30 packet, Rfl: 0  .  QUEtiapine (SEROQUEL) 50 MG tablet, Take 1 tablet (50 mg total) by mouth nightly, Disp: 30 tablet, Rfl: 0  .  rOPINIRole (REQUIP) 1 MG tablet, Take 1 tablet (1 mg total) by mouth nightly, Disp: 30 tablet, Rfl: 0  .  sucralfate (CARAFATE) 1 g tablet, Take 1 tablet (1 g total) by mouth 4 times daily - with meals and at bedtime, Disp: 120 tablet, Rfl: 0  .  traMADol (ULTRAM) 50 MG tablet, Take 1 tablet (50 mg total) by  mouth 2 (two) times daily as needed for Pain, Disp: 15  tablet, Rfl: 0    Discharge diagnoses:   62 y.o. 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  Follow-up with neurologist within 1 month.  Refer to case management discharge summary for additional follow-up information.    Rehabilitation services required:  Skilled Nursing, Physical Therapy, Occupational Therapy  and Speech and Language Therapy in-home service(s) for you while you recuperate  at home, to assist you in the transition from hospital to home.      The agency that you or your representative chose to provide the service:Williams, Laren Boom, MD (Primary Care)  [facility]  [address] [place] [address]  Phone: [phone]  Appointment: Monday, September 10, 2018 at 2:30pm; Please arrive 15 minutes  before your scheduled appointment. Bring your insurance information, all  medications you are currently taking and any appropriate x-rays or test results.        Sherryll Burger, MD (Neurology)  [facility]  [address] [place] [address]  Phone: [phone]  Appointment: scheduled appointment for 1-2 weeks after discharge from rehab     I have had a face to face visit with patient  and have determined there is need for home health services as the patient is unable to leave home without assistance at this time.        Discharge medications:      Malya, Cirillo   Home Medication Instructions ONG:[id number]    Printed on:08/29/18 1227   Medication Information                      acetaminophen (TYLENOL) 325 MG tablet  Take 2 tablets (650 mg total) by mouth every 4 (four) hours as needed for Pain             atorvastatin (LIPITOR) 40 MG tablet  Take 1 tablet (40 mg total) by mouth nightly              butalbital-acetaminophen-caffeine (FIORICET, ESGIC) 50-325-40 MG per tablet  Take 1 tablet by mouth every 6 (six) hours as needed for Headaches             dabigatran (PRADAXA) 150 MG Cap  Take 1 capsule (150 mg total) by mouth every 12 (twelve) hours             furosemide (LASIX) 20 MG tablet  Take 1 tablet (20 mg total) by mouth daily             lamoTRIgine (LAMICTAL) 150 MG tablet  Take 1 tablet (150 mg total) by mouth 2 (two) times daily             metoprolol succinate XL (TOPROL-XL) 25 MG 24 hr tablet  Take 1 tablet (25 mg total) by mouth daily             pantoprazole (PROTONIX) 40 MG tablet  Take 1 tablet (40 mg total) by mouth daily             psyllium (METAMUCIL) 58.12 % Pack packet  Take 1 packet by mouth daily             QUEtiapine (SEROQUEL) 50 MG tablet  Take 1 tablet (50 mg total) by mouth nightly  rOPINIRole (REQUIP) 1 MG tablet  Take 1 tablet (1 mg total) by mouth nightly             sucralfate (CARAFATE) 1 g tablet  Take 1 tablet (1 g total) by mouth 4 times daily - with meals and at bedtime             traMADol (ULTRAM) 50 MG tablet  Take 1 tablet (50 mg total) by mouth 2 (two) times daily as needed for Pain                     Medication Review  1. A complete drug regimen review was completed: Yes  2. Were drug issues were found during review: No             CC: Malcolm Metro, MD                                Discharge planning, preparation,  coordination, and face to face discussion with the patient  took more than  35 minutes     Hendricks Limes, DO

## 2018-08-29 NOTE — Rehab Discharge Summary ([facility]) (Signed)
[name]  MRN: [id number]  Account: [id number]  Session Start: 08/29/2018 12:00:00 AM  Session Stop: 08/29/2018 12:00:00 AM    Inpatient Rehabilitation  Rehabilitation Nursing Discharge Summary    Rehab Diagnosis: CVA  Demographics:            Age: 22Y            Gender: Female  Primary Language: English    Date of Onset:  08/18/18  Date of Admission: 08/21/2018 6:06:19 PM    Rehabilitation Precautions Restrictions:   DM  diet consistent carbs, cardiac, mech soft  skin  Risk for Fall, seizure precautions, aspiration    Discharge:  Patient discharged to:   Home  At discharge, the patient was discharged to live (with):  Family / Relatives.  Follow up providers include: Family.    WEIGHT  Current Weight: 97.5 kilograms. Patient weighed using bed scale.    Patient Report: finally i am going home tomorrow, i already did my packing.  Patient/Caregiver Goals:  "To be able to focus and not repeat myself so much"    Wounds/Incisions: No wounds or incisions.    Medication Review: No clinically significant medication issues identified this  shift.      Education Provided:    Education Provided: Precautions. Pain management. Medication options. Side  effects. Clinical indicators of pain. Safety issues and interventions. Fall  protocol. Use of adaptive devices. Safety. Medication. Name and dosage.  Administration. Purpose. Side Effects. Stroke. What is stroke, Types of stroke,  Signs /T/ Symptoms associated with stroke, Stroke prevention, Medication  compliance, Control blood pressure, high cholesterol and/or diabetes, Healthy  diet, Increase physical activity, Reduce stress, Stroke recovery       Audience: Patient.       Mode: Explanation.         Response: Applied knowledge.  Verbalized understanding.    Nursing Interventions This Shift: Encourage patient to follow safety precaution  always before mobility to prevent falls, maintain aspiration precaution, check  blood pressure daily before blood pressure medication,  Notify immediately to  doctor if any sign or symptoms of stroke observed.    ASSESSMENT  Long Term Goals (status prior to discharge): 1. Patient will be able to list 3  risk factors for stroke and will be able to articulate the meaning and use of  the FAST acronym for stroke identification   2. Patient will know the names, indications, and side effects of all her  medications 100 percent of the time  2 weeks from 08/21/18  Short Term Goals (status prior to discharge): 1. Patient will be able to  articulate the meaning of the FAST acronym for stroke identification   2. Patient will know the names and indications of her medications 75 percent of  the time  1 week from 08/21/18    Progress Towards Goals (final status): SHORT TERM GOAL REVIEW:       1. Patient will be able to articulate the meaning of the FAST acronym for  stroke identification - Met       2. Patient will know the names and indications of her medications 75  percent of the time - Met   LONG TERM GOAL REVIEW:       1. Patient will be able to list 3 risk factors for stroke and will be able  to articulate the meaning and use of the FAST acronym for stroke identification  - Met       2. Patient will know  the names, indications, and side effects of all her  medications 100 percent of the time - Met    PLAN  Recommendations for Follow-Up Care:   Patient did not receive valuables because pt will receive valuables at time of  discharge from day shift nurse. patient's own medication given to patient.  Bladder Program: continent  Bowel Program:  Last Bowel Movement- 08/24/2018   continent  Skin: skin is intact  Current Diet: consistent carb, cardiac mech soft with thin liquid.  Pain Management: Medication. take pain medication as needed. follow case manager  discharge instructions for follow appointments    Care Plan  Identified problems from team documentation:  Problem: Impaired Cognition  Cognition: Primary Team Goal: Patient will utilize compensatory  cognitive  strategies as needed in order to complete routine personal/household management  tasks (meal planning, scheduling appointments, etc.) with 80% accuracy provided  min A./Met    Problem: Impaired Mobility  Mobility: Primary Team Goal: Pt will be mod I for all bed mobility, transfers,  ambulation 350 feet with LRAD, and supervision for two flights of stairs with  single HR./Met    Problem: Impaired Psychosocial Skills/Behavior  PsychoSocial: Primary Team Goal: Patient will regularly employ adaptive coping  skills for adjusting to medical illness, reduced function, and rehab  hospitalization via maintaining adherence with her rehab plan of care, and  expressing positive expectations for increasing her overall future functional  independence./    Problem: Impaired Self-care Mgmt/ADL/IADL  Self Care: Primary Team Goal: Pt will complete total body dressing including  item retrieval at a mod I level using LRAD/Not Met    Status update for discharge:     Bladder Management:   Primary Goal: Met  Discharge Status Comment:   continent     Bowel Management:   Primary Goal: Met  Discharge Status Comment:   continent     Pain Management:   Primary Goal: Met  Discharge Status Comment:   pain is well managed with prescribed pain  medication.     Safety Risk:   Primary Goal: Met  Discharge Status Comment:   pt recogonize limitations and follow safety  precautions all the time to prevent any fall.    Please review Integrated Patient View Care Plan Flowsheet for Team identified  Problems, Interventions, and Goals.    Signed by: Ritta Slot, RN 08/29/2018 2:50:00 AM

## 2018-08-30 NOTE — Rehab Progress Note ([facility]) (Signed)
[name]  MRN: [id number]  Account: [id number]  Session Start: 08/29/2018 12:00:00 AM  Session Stop: 08/29/2018 12:00:00 AM    Case Management  Inpatient Rehabilitation Team Conference    Conference Date/Time: 08/22/2018 1:48:00 PM    Demographics            Age: 62Y            Gender: Female    Admission Date: 08/21/2018 6:06:19 PM  Diagnosis: CVA  Comorbidities:    VITAL SIGNS  Blood Pressure: 122/75 mmHg  Temperature:  degrees  Pulse:  beats per minute  Respirations:  breaths per minute  Pain: 4/10    WEIGHT and NUTRITION  Admission Weight: 214.5 pounds; Current Weight: 214.5pounds  Weight Change since Admit: Patient has had no weight change since admission.  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, worse in the left upper extremity  Intensity and Duration: an average of 3 hours/5 days per week  Medical Supervision and 24 Hour Rehab Nursing: x  Physical Therapy: x  PT Intensity/Duration: 60-120'daily for 5-6 d/wk  Occupational Therapy: x  OT Intensity/Duration: 60-120'daily for 5-6 d/wk  Speech and Language Therapy: x  SLP Intensity/Duration: 60-120'daily for 5-6 d/wk  Case Management: Arlie Solomons, CM  Nurse: Ludger Nutting, RN  Occupational Therapy: Ila Mcgill, OT  Physical Therapy: Bertis Ruddy, PT  Physician: Hendricks Limes, MD  Speech Language Pathology: Lyndle Herrlich, SLP    The following is a list of patient problems that have been identified by the  interdisciplinary team:    Problem: Impaired Bladder Management  Bladder Management Status Update: continent    Problem: Impaired Bowel Management  Bowel Management Status  Update: continent    Problem: Impaired Cognition  Cognition Status Update: Patient demonstrated effective use of external memory  aids to support recall of pertinent information during simulated household tasks  (i.e. Bill pay) with 100% accy given supervision support.  Team Identified Barrier to Discharge: No  Interventions:  Decrease environmental stimuli: Active  Safety awareness training: Active  Compensatory strategies: Active  Cognition: Primary Team Goal/Status: Patient will utilize compensatory cognitive  strategies as needed in order to complete routine personal/household management  tasks (meal planning, scheduling appointments, etc.) with 80% accuracy provided  min A. / Met    Problem: Impaired Mobility  Mobility Status Update: Mod I transfers and ambulation; Supv stair negotiation  Mobility: Primary Team Goal/Status: Pt will be mod I for all bed mobility,  transfers, ambulation 350 feet with LRAD, and supervision for two flights of  stairs with single HR. / Met    Problem: Impaired Pain Management  Pain Management Status Update: pain is well managed with prescribed pain  medication.    Problem: Impaired Psychosocial Skills/Behavior  PsychoSocial/Behavior Status Update: Patient is working on adaptively coping  with current medical status.  Team Identified Barrier to Discharge: No  Interventions:  Supportive counseling: Discontinue  PsychoSocial: Primary Team Goal/Status: Patient will regularly employ adaptive  coping strategies to assist with mood regulation.  / Met    Problem: Impaired Self-care Mgmt/ADL/IADL  Self Care/ADL/IADL Status Update: compeltes total body dressing with set-up  assistance  Team Identified Barrier to Discharge: Yes  Interventions:  Adaptive equipment  training: Active  Compensatory strategies: Active  Encourage patient to participate in activities of daily living: Active  Self Care: Primary Team Goal/Status: Pt will complete total body dressing  including item retrieval at a mod I  level using LRAD / Not Met    Problem: Safety Risk and Restraint  Safety Risk Status Update: pt recogonize limitations and follow safety  precautions all the time to prevent any fall.    Self Care Functional Status  Eating:  6 - Independent without devices  Oral Hygiene:  6 - Independent without devices  Toileting:  6 - Independent with devices  Shower Bathe:  4 - Supervision or touching assistance  Upper Body Dressing:  5 - Setup or clean-up assistance  Lower Body Dressing:  3 - Partial/moderate assistance  Putting On/Taking Off Footwear:  6 - Independent without devices    Mobility Functional Status  Roll Left and Right:  6 - Independent without devices  Sit to Lying:  6 - Independent without devices  Lying to Sitting on Side of Bed:  6 - Independent without devices  Sit to Stand:  6 - Independent without devices  Chair/Bed-to-Chair Transfer:  6 - Independent with devices  Toilet Transfer:  6 - Independent with devices  Car Transfer:  6 - Independent with devices  Walk 10 Feet:  6 - Independent with devices  Walk 50 Feet with Two Turns:  6 - Independent with devices  Walking 150 Feet:  6 - Independent with devices  Walking 10 Feet on Uneven Surface:  4 - Supervision or touching assistance  1 Step - Curb:  4 - Supervision or touching assistance  4 Steps:  4 - Supervision or touching assistance  12 Steps:  4 - Supervision or touching assistance  Picking Up an Object:  6 - Independent with devices      Comments: Confirmed discharge to home today.    Signed by: Arlie Solomons, MSW 08/29/2018 2:43:00 PM    Physician CoSigned By: Hendricks Limes 08/30/2018 16:51:57

## 2018-09-03 NOTE — Rehab PPS CMG ([facility]) (Signed)
[name]  MRN: [id number]  Account: [id number]  Session Start: 08/29/2018 12:00:00 AM  Session Stop: 08/29/2018 12:00:00 AM    PPS CMG Coordinator  Inpatient Rehabilitation Discharge    Mode of Locomotion: Walking.    Discharge Against Medical Advice:  No.  Discharge Information: Patient Discharged Alive:  Yes  Discharge Destination/Living Setting: Home with Home Health Services    Impairment Group: Stroke: 01.1 Left Body Involvement (Right Brain)    Comorbidities:  Rank Code      Description    1    I10.      Essential (primary) hypertension  2    G81.94    Hemiplegia, unspecified affecting left                 nondominant side  3    D64.9     Anemia, unspecified  4    R13.10    Dysphagia, unspecified  5    R27.8     Other lack of coordination  6    E78.5     Hyperlipidemia, unspecified  7    E11.9     Type 2 diabetes mellitus without complications  8    F31.9     Bipolar disorder, unspecified  9    M79.18    Myalgia, other site  10   I48.0     Paroxysmal atrial fibrillation  11   R56.9     Unspecified convulsions  12   R07.89    Other chest pain  13   J44.9     Chronic obstructive pulmonary disease,                 unspecified  14   I35.1     Nonrheumatic aortic (valve) insufficiency  15   R20.0     Anesthesia of skin  16   K59.00    Constipation, unspecified  17   L29.9     Pruritus, unspecified  18   T42.6X5A  Adverse effect of other antiepileptic and                 sedative-hypnotic drugs, initial encounter  19   R51.      Headache      ********************************  Complications:  Rank Code      Description    1    R07.89    Other chest pain  2    J44.9     Chronic obstructive pulmonary disease,                 unspecified  3    I35.1     Nonrheumatic aortic (valve) insufficiency  4    I48.0     Paroxysmal atrial fibrillation  5    L29.9     Pruritus, unspecified  6    T42.6X5A  Adverse effect of other antiepileptic and                 sedative-hypnotic drugs, initial  encounter      ********************************    MEDICAL NEEDS  Swallowing Status: Swallowing Status: Modified Food Consistency/Supervision:  subject requires modified food consistency and/or needs supervision for safety.    QUALITY INDICATORS  Section GG: Functional Abilities  Self Care Discharge Performance                       Performance  [id number]. Self Care  Eating  6 - Independent  Oral hygiene         6 - Independent  Toileting hygiene    6 - Independent  Shower/bathe self    4 - Supervision or touching assistance  Upper body dressing  6 - Independent  Lower body dressing  6 - Independent  Footwear on/off      6 - Independent    Mobility Discharge Performance                       Performance  GG0170. Mobility  Roll left/right      6 - Independent  Sit to lying         6 - Independent  Lying to sitting bed 6 - Independent  Sit to stand         6 - Independent  Chair/bed transfer   6 - Independent  Toilet transfer      6 - Independent  Car transfer         6 - Independent  Walk 10 feet         6 - Independent  Walk 50 ft 2 turns   6 - Independent  Walk 150 feet        6 - Independent  10 ft uneven surface 4 - Supervision or touching assistance  1 step (curb)        4 - Supervision or touching assistance  4 steps              4 - Supervision or touching assistance  12 steps             4 - Supervision or touching assistance  Picking up object    6 - Independent  Use wheelchair?      No    Section M. Skin Conditions Discharge  Unhealed Pressure Ulcer(s)/Injurie(s) at Stage 1 or Higher:  No  Current Number of Unhealed Pressure Ulcers    Number of Unhealed Stage 1 Pressure Injuries: 0  Number of Unhealed Stage 2: 0  Number of Unhealed Stage 3: 0  Number of Unhealed Stage 4: 0  Number of Unhealed Unstageable Due to Non-removable Dressing/Device: 0  Number of Unhealed Unstageable Due to Slough/Eschar: 0  Number of Unhealed Unstageable Injuries Presenting as Deep Tissue Injury: 0  Worsening in Pressure  Ulcer Status Since Admission    Number of Worsening Stage 2: 0  Number of Worsening Stage 3: 0  Number of Worsening Stage 4: 0  Number of Worsening Unstageable Due to Non-removable Dressing: 0  Number of Worsening Unstageable Due to Slough/Eschar: 0  Number of Worsening Unstageable Due to Suspected Deep Tissue Injury: 0  Healed Pressure Ulcer(s)    Number of Healed Stage 1: 0  Number of Healed Stage 2: 0  Number of Healed Stage 3: 0  Number of Healed Stage 4: 0    [id number].Influenza Vaccine - Discharge: Received in this facility for this year's  influenza vaccination season:  No.  Influenza Vaccine Not Received Due To: Offered and declined.    Health Conditions: Fall(s) Since Admission:  No    Section N. Medication    Medication Intervention: N/A - There were no potential clinically significant  medication issues identified since admission or patient is not taking any  medications.    Signed by: Carolyn Stare, M.S. , OTR/L , PPS Coordinator 08/29/2018 4:00:00  PM

## 2018-11-21 ENCOUNTER — Inpatient Hospital Stay: Payer: Medicare Other | Admitting: Rehabilitative and Restorative Service Providers"

## 2018-11-23 ENCOUNTER — Inpatient Hospital Stay: Payer: Medicare Other

## 2018-11-27 ENCOUNTER — Inpatient Hospital Stay: Payer: Medicare Other

## 2018-11-29 ENCOUNTER — Inpatient Hospital Stay: Payer: Medicare Other

## 2018-12-03 ENCOUNTER — Inpatient Hospital Stay: Payer: Medicare Other | Admitting: Rehabilitative and Restorative Service Providers"

## 2018-12-06 ENCOUNTER — Inpatient Hospital Stay: Payer: Medicare Other | Admitting: Rehabilitative and Restorative Service Providers"

## 2018-12-10 ENCOUNTER — Inpatient Hospital Stay: Payer: Medicare Other

## 2018-12-12 ENCOUNTER — Inpatient Hospital Stay: Payer: Medicare Other

## 2019-01-05 ENCOUNTER — Inpatient Hospital Stay
Admission: EM | Admit: 2019-01-05 | Discharge: 2019-01-09 | DRG: 201 | Disposition: A | Discharge status: Home / Self Care | Payer: Medicare Other | Attending: Internal Medicine | Admitting: Internal Medicine

## 2019-01-05 DIAGNOSIS — J982 Interstitial emphysema: Secondary | ICD-10-CM

## 2019-01-05 DIAGNOSIS — Z79899 Other long term (current) drug therapy: Secondary | ICD-10-CM

## 2019-01-05 DIAGNOSIS — D649 Anemia, unspecified: Secondary | ICD-10-CM | POA: Diagnosis present

## 2019-01-05 DIAGNOSIS — I480 Paroxysmal atrial fibrillation: Secondary | ICD-10-CM | POA: Diagnosis present

## 2019-01-05 DIAGNOSIS — M436 Torticollis: Secondary | ICD-10-CM | POA: Diagnosis present

## 2019-01-05 DIAGNOSIS — K1121 Acute sialoadenitis: Secondary | ICD-10-CM

## 2019-01-05 DIAGNOSIS — R079 Chest pain, unspecified: Secondary | ICD-10-CM | POA: Diagnosis present

## 2019-01-05 DIAGNOSIS — Z7901 Long term (current) use of anticoagulants: Secondary | ICD-10-CM

## 2019-01-05 DIAGNOSIS — Z886 Allergy status to analgesic agent status: Secondary | ICD-10-CM

## 2019-01-05 DIAGNOSIS — E785 Hyperlipidemia, unspecified: Secondary | ICD-10-CM | POA: Diagnosis present

## 2019-01-05 DIAGNOSIS — R072 Precordial pain: Secondary | ICD-10-CM | POA: Diagnosis present

## 2019-01-05 DIAGNOSIS — F32A Depression, unspecified: Secondary | ICD-10-CM

## 2019-01-05 DIAGNOSIS — E119 Type 2 diabetes mellitus without complications: Secondary | ICD-10-CM | POA: Diagnosis present

## 2019-01-05 DIAGNOSIS — J45909 Unspecified asthma, uncomplicated: Secondary | ICD-10-CM | POA: Diagnosis present

## 2019-01-05 DIAGNOSIS — I10 Essential (primary) hypertension: Secondary | ICD-10-CM | POA: Diagnosis present

## 2019-01-05 DIAGNOSIS — G40909 Epilepsy, unspecified, not intractable, without status epilepticus: Secondary | ICD-10-CM | POA: Diagnosis present

## 2019-01-05 DIAGNOSIS — Z8719 Personal history of other diseases of the digestive system: Secondary | ICD-10-CM

## 2019-01-05 DIAGNOSIS — T797XXA Traumatic subcutaneous emphysema, initial encounter: Principal | ICD-10-CM | POA: Diagnosis present

## 2019-01-05 DIAGNOSIS — G2581 Restless legs syndrome: Secondary | ICD-10-CM | POA: Diagnosis present

## 2019-01-05 HISTORY — DX: Unspecified convulsions: R56.9

## 2019-01-05 HISTORY — DX: Transient cerebral ischemic attack, unspecified: G45.9

## 2019-01-05 LAB — CBC AND DIFFERENTIAL
Absolute NRBC: 0 10*3/uL (ref 0.00–0.00)
Basophils Absolute Automated: 0.04 10*3/uL (ref 0.00–0.08)
Basophils Automated: 0.6 %
Eosinophils Absolute Automated: 0.24 10*3/uL (ref 0.00–0.44)
Eosinophils Automated: 3.6 %
Hematocrit: 32.1 % — ABNORMAL LOW (ref 34.7–43.7)
Hgb: 10 g/dL — ABNORMAL LOW (ref 11.4–14.8)
Immature Granulocytes Absolute: 0.04 10*3/uL (ref 0.00–0.07)
Immature Granulocytes: 0.6 %
Lymphocytes Absolute Automated: 2.17 10*3/uL (ref 0.42–3.22)
Lymphocytes Automated: 32.5 %
MCH: 26.2 pg (ref 25.1–33.5)
MCHC: 31.2 g/dL — ABNORMAL LOW (ref 31.5–35.8)
MCV: 84 fL (ref 78.0–96.0)
MPV: 10.4 fL (ref 8.9–12.5)
Monocytes Absolute Automated: 0.57 10*3/uL (ref 0.21–0.85)
Monocytes: 8.5 %
Neutrophils Absolute: 3.62 10*3/uL (ref 1.10–6.33)
Neutrophils: 54.2 %
Nucleated RBC: 0 /100 WBC (ref 0.0–0.0)
Platelets: 221 10*3/uL (ref 142–346)
RBC: 3.82 10*6/uL — ABNORMAL LOW (ref 3.90–5.10)
RDW: 16 % — ABNORMAL HIGH (ref 11–15)
WBC: 6.68 10*3/uL (ref 3.10–9.50)

## 2019-01-05 LAB — COMPREHENSIVE METABOLIC PANEL
ALT: 14 U/L (ref 0–55)
AST (SGOT): 18 U/L (ref 5–34)
Albumin/Globulin Ratio: 1 (ref 0.9–2.2)
Albumin: 3.4 g/dL — ABNORMAL LOW (ref 3.5–5.0)
Alkaline Phosphatase: 82 U/L (ref 37–106)
Anion Gap: 11 (ref 5.0–15.0)
BUN: 7 mg/dL (ref 7–19)
Bilirubin, Total: 0.1 mg/dL — ABNORMAL LOW (ref 0.2–1.2)
CO2: 25 mEq/L (ref 22–29)
Calcium: 9.4 mg/dL (ref 8.5–10.5)
Chloride: 103 mEq/L (ref 100–111)
Creatinine: 0.9 mg/dL (ref 0.6–1.0)
Globulin: 3.3 g/dL (ref 2.0–3.6)
Glucose: 107 mg/dL — ABNORMAL HIGH (ref 70–100)
Potassium: 4.2 mEq/L (ref 3.5–5.1)
Protein, Total: 6.7 g/dL (ref 6.0–8.3)
Sodium: 139 mEq/L (ref 136–145)

## 2019-01-05 LAB — PT AND APTT
PT INR: 1 (ref 0.9–1.1)
PT: 12.6 s (ref 12.6–15.0)
PTT: 22 s — ABNORMAL LOW (ref 23–37)

## 2019-01-05 LAB — TROPONIN I: Troponin I: <0.01 ng/mL (ref 0.00–0.05)

## 2019-01-05 LAB — B-TYPE NATRIURETIC PEPTIDE: B-Natriuretic Peptide: <10 pg/mL (ref 0–100)

## 2019-01-05 LAB — GFR: EGFR: >60

## 2019-01-05 MED ORDER — CLINDAMYCIN HCL 150 MG PO CAPS
450.00 mg | ORAL_CAPSULE | Freq: Once | ORAL | Status: AC
Start: 2019-01-05 — End: 2019-01-05
  Administered 2019-01-05: 450 mg via ORAL
  Filled 2019-01-05: qty 3

## 2019-01-05 MED ORDER — CYCLOBENZAPRINE HCL 10 MG PO TABS
10.00 mg | ORAL_TABLET | Freq: Once | ORAL | Status: AC
Start: 2019-01-05 — End: 2019-01-05
  Administered 2019-01-05: 10 mg via ORAL
  Filled 2019-01-05: qty 1

## 2019-01-05 MED ORDER — RANOLAZINE ER 500 MG PO TB12
500.00 mg | ORAL_TABLET | Freq: Once | ORAL | Status: AC
Start: 2019-01-05 — End: 2019-01-05
  Administered 2019-01-05: 500 mg via ORAL
  Filled 2019-01-05 (×2): qty 1

## 2019-01-05 MED ORDER — DIPHENHYDRAMINE HCL 50 MG/ML IJ SOLN
25.00 mg | Freq: Once | INTRAMUSCULAR | Status: AC
Start: 2019-01-05 — End: 2019-01-05
  Administered 2019-01-05: 25 mg via INTRAVENOUS
  Filled 2019-01-05: qty 1

## 2019-01-05 MED ORDER — MORPHINE SULFATE 4 MG/ML IJ/IV SOLN (WRAP)
4.0000 mg | Freq: Once | Status: AC
Start: 2019-01-05 — End: 2019-01-05
  Administered 2019-01-05: 4 mg via INTRAVENOUS
  Filled 2019-01-05: qty 1

## 2019-01-05 MED ORDER — CLOPIDOGREL BISULFATE 75 MG PO TABS
300.00 mg | ORAL_TABLET | Freq: Once | ORAL | Status: AC
Start: 2019-01-05 — End: 2019-01-05
  Administered 2019-01-05: 300 mg via ORAL
  Filled 2019-01-05: qty 4

## 2019-01-05 NOTE — ED Triage Notes (Addendum)
Chest pain started 2 hours ago  States has Jaw pain and is nauseated

## 2019-01-05 NOTE — ED Provider Notes (Addendum)
EMERGENCY DEPARTMENT NOTE   [facility] or [facility] Emergency Department     Physician/Midlevel provider first contact with patient: 01/05/19 2123       HISTORY OF PRESENT ILLNESS   Historian: Patient  Translator Used: No    Chief Complaint: Chest Pain            63 y.o. female with a past medical history of hypertension, pAFib on eliquis, hyperlipidemia, asthma, type 2 diabetes, and seizure disorder who presents with 9/10 severity substernal chest pressure that began around 8 AM this morning when she awoke and noticed left sided jaw and neck swelling and pain.  Patient felt feverish, fatigued, and slight nausea.  No vomiting or diarrhea.  No abdominal pain.  Patient feels lightheadedness but no vertigo.  Patient has had substernal chest pressure in the past for which the last time occurred 1 month ago.  Patient has never had the left jaw and neck pain before.  No prior history of MI.  No history of MI under the age of 41 in the family.  Patient's last stress test was 1 year ago. No recent long flights or sitting for prolonged periods of time.     MEDICAL HISTORY     Past Medical History:  Past Medical History:   Diagnosis Date   . Asthma    . Convulsions    . Diabetes mellitus     diet controlled    . Hyperlipidemia    . Hypertension    . TIA (transient ischemic attack)        Past Surgical History:  Past Surgical History:   Procedure Laterality Date   . HYSTERECTOMY     . pci         Social History:  Social History     Socioeconomic History   . Marital status: Divorced     Spouse name: Not on file   . Number of children: Not on file   . Years of education: Not on file   . Highest education level: Not on file   Occupational History   . Not on file   Social Needs   . Financial resource strain: Not on file   . Food insecurity:     Worry: Not on file     Inability: Not on file   . Transportation needs:     Medical: Not on file     Non-medical: Not on file   Tobacco Use   . Smoking status: Never Smoker   .  Smokeless tobacco: Never Used   Substance and Sexual Activity   . Alcohol use: Never     Frequency: Never   . Drug use: Never   . Sexual activity: Not on file   Lifestyle   . Physical activity:     Days per week: Not on file     Minutes per session: Not on file   . Stress: Not on file   Relationships   . Social connections:     Talks on phone: Not on file     Gets together: Not on file     Attends religious service: Not on file     Active member of club or organization: Not on file     Attends meetings of clubs or organizations: Not on file     Relationship status: Not on file   . Intimate partner violence:     Fear of current or ex partner: Not on file     Emotionally  abused: Not on file     Physically abused: Not on file     Forced sexual activity: Not on file   Other Topics Concern   . Not on file   Social History Narrative   . Not on file       Family History:  No family history on file.    Outpatient Medication:  Discharge Medication List as of 01/09/2019  6:49 PM      CONTINUE these medications which have NOT CHANGED    Details   acetaminophen (TYLENOL) 325 MG tablet Take 2 tablets (650 mg total) by mouth every 4 (four) hours as needed for Pain, Starting Wed 08/29/2018, Print      atorvastatin (LIPITOR) 40 MG tablet Take 40 mg by mouth daily, Historical Med      butalbital-acetaminophen-caffeine (FIORICET, ESGIC) 50-325-40 MG per tablet Take 1 tablet by mouth every 6 (six) hours as needed for Headaches, Starting Wed 08/29/2018, Print      lamoTRIgine (LAMICTAL) 150 MG tablet Take 1 tablet (150 mg total) by mouth 2 (two) times daily, Starting Wed 08/29/2018, Print      levETIRAcetam (KEPPRA) 500 MG tablet Take 500 mg by mouth 2 (two) times daily, Historical Med      metoprolol succinate XL (TOPROL-XL) 25 MG 24 hr tablet Take 25 mg by mouth daily, Historical Med      pantoprazole (PROTONIX) 40 MG tablet Take 1 tablet (40 mg total) by mouth daily, Starting Wed 08/29/2018, Print      QUEtiapine (SEROQUEL) 50 MG tablet  Take 1 tablet (50 mg total) by mouth nightly, Starting Wed 08/29/2018, Print      ranolazine (RANEXA) 500 MG 12 hr tablet Take 500 mg by mouth 2 (two) times daily, Historical Med      rOPINIRole (REQUIP) 1 MG tablet Take 1 tablet (1 mg total) by mouth nightly, Starting Wed 08/29/2018, Print      furosemide (LASIX) 20 MG tablet Take 1 tablet (20 mg total) by mouth daily, Starting Thu 08/30/2018, Print      psyllium (METAMUCIL) 58.12 % Pack packet Take 1 packet by mouth daily, Starting Thu 08/30/2018, Print             PMH, SH, FH, Meds in chart reviewed and agreed  REVIEW OF SYSTEMS   ROS    A pertinent 10 point review of systems was performed and was normal except as otherwise noted. Please also see HPI for added review of systems      PHYSICAL EXAM     ED Triage Vitals [01/05/19 2118]   Enc Vitals Group      BP 131/67      Heart Rate 95      Resp Rate 20      Temp 100 F (37.8 C)      Temp src       SpO2 99 %      Weight 95.3 kg      Height 1.651 m      Head Circumference       Peak Flow       Pain Score 9      Pain Loc       Pain Edu?       Excl. in GC?      GENERAL: Nontoxic, no acute distress, alert, oriented x 4  HEENT: Normocephalic, atraumatic, MMM, tenderness over the left parotid gland  NECK: supple, positive left digastric lymphadenopathy and left sternocleidomastoid torticollis.  LUNGS: Clear to  auscultation bilaterally. No crackles or wheezes.  CV: RRR, normal S1/S2, no murmurs.  GI: soft, nontender, normoactive bowel sounds  GU: no suprapubic tenderness.  BACK: no spinal or paraspinal tenderness.  EXT: No cyanosis. no distal pitting edema. well perfused.  SKIN: warm and dry, no ulcerations.  NEURO: No slurring of speech or facial droop. Moving all 4 extremities on demand without deficit.    MEDICAL DECISION MAKING   DDX: MSK pain, MI, ACS: Stable vs unstable angina, Atrial flutter, A-fib, Pleurisy, CHF, Chest wall pain, Reflux, Pneumonia, PE       DISCUSSION    This is a 62 year old female who presents  emergency room with substernal chest pressure and pain that is 9/10 in severity and associated nausea without any vomiting.  Based on her risk factors she currently has a heart score of 6.  Her last stress test was over a year ago according to the patient.  Patient mostly gets her health care at [facility]  Patient was also noted to have acute torticollis of the left neck muscle as well as a swollen left parotid gland.  Patient was started on clindamycin and given Flexeril.  Patient given Plavix rather than aspirin due to her history of allergy to Aspirin.  No STEMI on EKG but new EKG T wave abnormality in the lateral leads present. Troponin x1 neg. CXR personally visualized and interpreted by myself shows no infiltrate, effusion, consolidation or pneumothorax.           NIH Stroke Score      Most Recent Value   Patient's calculated Stroke Score:  4 filed at 01/06/2019 1247        Vital Signs: I have reviewed the patient's vital signs.   Nursing Notes: I have reviewed and utilized available nursing notes.  Medical Records Reviewed: Reviewed available past medical records.  Counseling:  I have personally spoken with the patient and discussed today's findings, in addition to providing specific details for the plan of care.  All questions answered and there is agreement with the plan.        CARDIAC STUDIES    The following cardiac studies were independently interpreted by the Emergency Medicine Physician.  For full cardiac study results please see chart.    EKG Interpretation:  Signed and interpreted by ED Physician  Time Interpreted: 21: 21 rate: 94 rhythm: Normal sinus rhythm. Normal Axis.  Normal QRS and duration of 84.  QTc 472.  No Ectopy.  No significant ST elevations or depressions.  Biphasic T waves in the anterolateral leads. Nonspecific T wave inversion V3.  No STEMI.  Impression: Normal sinus rhythm with T wave abnormality in the anterolateral leads.        RADIOLOGY IMAGING STUDIES      CT Soft Tissue Neck  W Contrast   Final Result    Extensive subcutaneous emphysema in the right aspect of the   neck extending to the right parapharyngeal region. The subcutaneous   emphysema extends from just C1/C2 level to the anterior thoracic wall.   Nonspecific edema and small amount of fluid in the anterior superior   chest wall at the level off the manubriosternal joint more pronounced on   the right. This is nonspecific and be related to prior trauma, however   extensive infection could have similar appearance. No evidence of   drainable abscess.      There is mild prominence of the bilateral pontine tonsils without   collections.  Nonspecific tongue edema.      These     findings and/or recommendations were called to   Dr. Bluford Main    at time of report signature on    01/06/2019 4:17 PM.          Joselyn Glassman, MD    01/06/2019 4:17 PM      CT Head WO Contrast   Final Result    No acute intracerebral abnormality.      Note: Note that CT scanning at this site  utilizes multiple dose   reduction techniques including automatic exposure control, adjustment of   the MAA and/or KVP according to patient's size and use of iterative   reconstruction technique      Laurena Slimmer, MD    01/06/2019 1:35 PM      XR Chest  AP Portable   Final Result      No acute process.      Adaline Sill, MD    01/06/2019 12:22 AM            PULSE OXIMETRY    Oxygen Saturation by Pulse Oximetry: 99%  Interventions: none  Interpretation:  Interpretation of oxygen level is normal     EMERGENCY DEPT. MEDICATIONS      ED Medication Orders (From admission, onward)    Start Ordered     Status Ordering Provider    01/06/19 0245 01/06/19 0245    RT - Every 6 hours as needed     Route: Nebulization  Ordered Dose: 1.25 mg     Discontinued Alecia Lemming    01/06/19 0233 01/06/19 0232  diphenhydrAMINE (BENADRYL) injection 25 mg  Once     Route: Intravenous  Ordered Dose: 25 mg     Last MAR action:  Given Ludmilla Mcgillis A    01/06/19 0114 01/06/19 0113  morphine  injection 4 mg  Once     Route: Intravenous  Ordered Dose: 4 mg     Last MAR action:  Given Eliyohu Class A    01/05/19 2345 01/05/19 2344  morphine injection 4 mg  Once     Route: Intravenous  Ordered Dose: 4 mg     Last MAR action:  Given Franchelle Foskett A    01/05/19 2345 01/05/19 2344  clopidogrel (PLAVIX) tablet 300 mg  Once     Route: Oral  Ordered Dose: 300 mg     Last MAR action:  Given Aryannah Mohon A    01/05/19 2201 01/05/19 2200  ranolazine (RANEXA) 12 hr tablet 500 mg  Once     Route: Oral  Ordered Dose: 500 mg     Last MAR action:  Given Vandell Kun A    01/05/19 2200 01/05/19 2159  diphenhydrAMINE (BENADRYL) injection 25 mg  Once     Route: Intravenous  Ordered Dose: 25 mg     Last MAR action:  Given Zareena Willis A    01/05/19 2154 01/05/19 2153  cyclobenzaprine (FLEXERIL) tablet 10 mg  Once     Route: Oral  Ordered Dose: 10 mg     Last MAR action:  Given Tyvon Eggenberger A    01/05/19 2153 01/05/19 2153  clindamycin (CLEOCIN) capsule 450 mg  Once     Route: Oral  Ordered Dose: 450 mg     Last MAR action:  Given Mccade Sullenberger A          LABORATORY RESULTS    Ordered and independently interpreted AVAILABLE laboratory tests.  Please see results section in chart for full details.  Results for orders placed or performed during the hospital encounter of 01/05/19   CBC and differential   Result Value Ref Range    WBC 6.68 3.10 - 9.50 x10 3/uL    Hgb 10.0 (L) 11.4 - 14.8 g/dL    Hematocrit 16.1 (L) 34.7 - 43.7 %    Platelets 221 142 - 346 x10 3/uL    RBC 3.82 (L) 3.90 - 5.10 x10 6/uL    MCV 84.0 78.0 - 96.0 fL    MCH 26.2 25.1 - 33.5 pg    MCHC 31.2 (L) 31.5 - 35.8 g/dL    RDW 16 (H) 11 - 15 %    MPV 10.4 8.9 - 12.5 fL    Neutrophils 54.2 None %    Lymphocytes Automated 32.5 None %    Monocytes 8.5 None %    Eosinophils Automated 3.6 None %    Basophils Automated 0.6 None %    Immature Granulocyte 0.6 None %    Nucleated RBC 0.0 0.0 - 0.0 /100 WBC    Neutrophils Absolute 3.62 1.10 - 6.33 x10 3/uL    Abs Lymph  Automated 2.17 0.42 - 3.22 x10 3/uL    Abs Mono Automated 0.57 0.21 - 0.85 x10 3/uL    Abs Eos Automated 0.24 0.00 - 0.44 x10 3/uL    Absolute Baso Automated 0.04 0.00 - 0.08 x10 3/uL    Absolute Immature Granulocyte 0.04 0.00 - 0.07 x10 3/uL    Absolute NRBC 0.00 0.00 - 0.00 x10 3/uL   Comprehensive metabolic panel   Result Value Ref Range    Glucose 107 (H) 70 - 100 mg/dL    BUN 7 7 - 19 mg/dL    Creatinine 0.9 0.6 - 1.0 mg/dL    Sodium 096 045 - 409 mEq/L    Potassium 4.2 3.5 - 5.1 mEq/L    Chloride 103 100 - 111 mEq/L    CO2 25 22 - 29 mEq/L    Calcium 9.4 8.5 - 10.5 mg/dL    Protein, Total 6.7 6.0 - 8.3 g/dL    Albumin 3.4 (L) 3.5 - 5.0 g/dL    AST (SGOT) 18 5 - 34 U/L    ALT 14 0 - 55 U/L    Alkaline Phosphatase 82 37 - 106 U/L    Bilirubin, Total 0.1 (L) 0.2 - 1.2 mg/dL    Globulin 3.3 2.0 - 3.6 g/dL    Albumin/Globulin Ratio 1.0 0.9 - 2.2    Anion Gap 11.0 5.0 - 15.0   Troponin I   Result Value Ref Range    Troponin I <0.01 0.00 - 0.05 ng/mL   B-type Natriuretic Peptide   Result Value Ref Range    B-Natriuretic Peptide <10 0 - 100 pg/mL   GFR   Result Value Ref Range    EGFR >60.0    PT/APTT   Result Value Ref Range    PT 12.6 12.6 - 15.0 sec    PT INR 1.0 0.9 - 1.1    PTT 22 (L) 23 - 37 sec   Lipid panel   Result Value Ref Range    Cholesterol 130 0 - 199 mg/dL    Triglycerides 68 34 - 149 mg/dL    HDL 55 40 - 8,119 mg/dL    LDL Calculated 61 0 - 99 mg/dL    VLDL Cholesterol Cal 14 10 - 40 mg/dL    CHOL/HDL Ratio 2.4 See  Below   Hemoglobin A1C   Result Value Ref Range    Hemoglobin A1C 5.8 4.6 - 5.9 %    Average Estimated Glucose 119.8 mg/dL   Hemolysis index   Result Value Ref Range    Hemolysis Index 5 0 - 18   Comprehensive metabolic panel   Result Value Ref Range    Glucose 99 70 - 100 mg/dL    BUN 6 (L) 7 - 19 mg/dL    Creatinine 0.8 0.6 - 1.0 mg/dL    Sodium 161 096 - 045 mEq/L    Potassium 4.2 3.5 - 5.1 mEq/L    Chloride 103 100 - 111 mEq/L    CO2 25 22 - 29 mEq/L    Calcium 9.1 8.5 - 10.5 mg/dL     Protein, Total 6.5 6.0 - 8.3 g/dL    Albumin 3.4 (L) 3.5 - 5.0 g/dL    AST (SGOT) 16 5 - 34 U/L    ALT 12 0 - 55 U/L    Alkaline Phosphatase 80 37 - 106 U/L    Bilirubin, Total 0.2 0.2 - 1.2 mg/dL    Globulin 3.1 2.0 - 3.6 g/dL    Albumin/Globulin Ratio 1.1 0.9 - 2.2    Anion Gap 12.0 5.0 - 15.0   CBC and differential   Result Value Ref Range    WBC 6.41 3.10 - 9.50 x10 3/uL    Hgb 9.9 (L) 11.4 - 14.8 g/dL    Hematocrit 40.9 (L) 34.7 - 43.7 %    Platelets 290 142 - 346 x10 3/uL    RBC 3.80 (L) 3.90 - 5.10 x10 6/uL    MCV 83.4 78.0 - 96.0 fL    MCH 26.1 25.1 - 33.5 pg    MCHC 31.2 (L) 31.5 - 35.8 g/dL    RDW 16 (H) 11 - 15 %    MPV 9.8 8.9 - 12.5 fL    Neutrophils 50.3 None %    Lymphocytes Automated 36.7 None %    Monocytes 8.0 None %    Eosinophils Automated 3.9 None %    Basophils Automated 0.6 None %    Immature Granulocyte 0.5 None %    Nucleated RBC 0.0 0.0 - 0.0 /100 WBC    Neutrophils Absolute 3.23 1.10 - 6.33 x10 3/uL    Abs Lymph Automated 2.35 0.42 - 3.22 x10 3/uL    Abs Mono Automated 0.51 0.21 - 0.85 x10 3/uL    Abs Eos Automated 0.25 0.00 - 0.44 x10 3/uL    Absolute Baso Automated 0.04 0.00 - 0.08 x10 3/uL    Absolute Immature Granulocyte 0.03 0.00 - 0.07 x10 3/uL    Absolute NRBC 0.00 0.00 - 0.00 x10 3/uL   Magnesium   Result Value Ref Range    Magnesium 1.7 1.6 - 2.6 mg/dL   Troponin I   Result Value Ref Range    Troponin I <0.01 0.00 - 0.05 ng/mL   GFR   Result Value Ref Range    EGFR >60.0    Troponin I   Result Value Ref Range    Troponin I <0.01 0.00 - 0.05 ng/mL   CBC without differential   Result Value Ref Range    WBC 5.63 3.10 - 9.50 x10 3/uL    Hgb 9.4 (L) 11.4 - 14.8 g/dL    Hematocrit 81.1 (L) 34.7 - 43.7 %    Platelets 233 142 - 346 x10 3/uL    RBC 3.60 (L) 3.90 - 5.10  x10 6/uL    MCV 82.2 78.0 - 96.0 fL    MCH 26.1 25.1 - 33.5 pg    MCHC 31.8 31.5 - 35.8 g/dL    RDW 16 (H) 11 - 15 %    MPV 10.6 8.9 - 12.5 fL    Nucleated RBC 0.0 0.0 - 0.0 /100 WBC    Absolute NRBC 0.00 0.00 - 0.00 x10  3/uL   CBC without differential   Result Value Ref Range    WBC 4.37 3.10 - 9.50 x10 3/uL    Hgb 9.5 (L) 11.4 - 14.8 g/dL    Hematocrit 91.4 (L) 34.7 - 43.7 %    Platelets 207 142 - 346 x10 3/uL    RBC 3.57 (L) 3.90 - 5.10 x10 6/uL    MCV 86.3 78.0 - 96.0 fL    MCH 26.6 25.1 - 33.5 pg    MCHC 30.8 (L) 31.5 - 35.8 g/dL    RDW 16 (H) 11 - 15 %    MPV 10.4 8.9 - 12.5 fL    Nucleated RBC 0.0 0.0 - 0.0 /100 WBC    Absolute NRBC 0.00 0.00 - 0.00 x10 3/uL   Basic Metabolic Panel   Result Value Ref Range    Glucose 114 (H) 70 - 100 mg/dL    BUN 7 7 - 19 mg/dL    Creatinine 0.8 0.6 - 1.0 mg/dL    Calcium 8.8 8.5 - 78.2 mg/dL    Sodium 956 213 - 086 mEq/L    Potassium 5.6 (H) 3.5 - 5.1 mEq/L    Chloride 108 100 - 111 mEq/L    CO2 22 22 - 29 mEq/L    Anion Gap 8.0 5.0 - 15.0   TSH   Result Value Ref Range    TSH 0.32 (L) 0.35 - 4.94 uIU/mL   B-type Natriuretic Peptide   Result Value Ref Range    B-Natriuretic Peptide 33 0 - 100 pg/mL   GFR   Result Value Ref Range    EGFR >60.0    CBC without differential   Result Value Ref Range    WBC 4.33 3.10 - 9.50 x10 3/uL    Hgb 9.9 (L) 11.4 - 14.8 g/dL    Hematocrit 57.8 (L) 34.7 - 43.7 %    Platelets 212 142 - 346 x10 3/uL    RBC 3.79 (L) 3.90 - 5.10 x10 6/uL    MCV 83.1 78.0 - 96.0 fL    MCH 26.1 25.1 - 33.5 pg    MCHC 31.4 (L) 31.5 - 35.8 g/dL    RDW 16 (H) 11 - 15 %    MPV 11.0 8.9 - 12.5 fL    Nucleated RBC 0.0 0.0 - 0.0 /100 WBC    Absolute NRBC 0.00 0.00 - 0.00 x10 3/uL   Basic Metabolic Panel   Result Value Ref Range    Glucose 92 70 - 100 mg/dL    BUN 7 7 - 19 mg/dL    Creatinine 0.8 0.6 - 1.0 mg/dL    Calcium 8.8 8.5 - 46.9 mg/dL    Sodium 629 528 - 413 mEq/L    Potassium 4.5 3.5 - 5.1 mEq/L    Chloride 109 100 - 111 mEq/L    CO2 21 (L) 22 - 29 mEq/L    Anion Gap 12.0 5.0 - 15.0   GFR   Result Value Ref Range    EGFR >60.0    ECG 12 Lead   Result Value Ref  Range    Ventricular Rate 94 BPM    Atrial Rate 94 BPM    P-R Interval 154 ms    QRS Duration 84 ms    Q-T Interval 378  ms    QTC Calculation (Bezet) 472 ms    P Axis 29 degrees    R Axis 31 degrees    T Axis 12 degrees       CRITICAL CARE/PROCEDURES    Procedures  Ultrasound Guided IJ Long IV Catheter Insertion  Indication: Unable to obtain peripheral IV Access after multiple attempts by nursing staff  Procedure:  Using Sterile Technique, a Long 20g IV was inserted into the right IJ using dynamic ultrasound guidance.  Procedure went smoothly and quickly on first attempt with good return of venous blood.  Catheter was connected to one way valve and site was used for initial blood draw with the assistance of nursing staff.  IV was then easily flushed without pain or swelling.  Patient tolerated the procedure well without complication or pain.    DIAGNOSIS      Diagnosis:  Final diagnoses:   Chest pain with high risk of acute coronary syndrome   Acute parotitis, left   Acute torticollis, left       Disposition:  ED Disposition     ED Disposition Condition Date/Time Comment    Observation  Sun Jan 06, 2019  1:27 AM Admitting Physician: Alecia Lemming [[id number]]   Diagnosis: Chest pain with high risk of acute coronary syndrome [161096]   Estimated Length of Stay: < 2 midnights   Tentative Discharge Plan?: Home or Self Care [1]   Patient Class: Observation [104]            Prescriptions:  Discharge Medication List as of 01/09/2019  6:49 PM      START taking these medications    Details   amLODIPine (NORVASC) 2.5 MG tablet Take 1 tablet (2.5 mg total) by mouth daily, Starting Thu 01/10/2019, Print      apixaban (ELIQUIS) 5 MG Take 1 tablet (5 mg total) by mouth every 12 (twelve) hours, Starting Wed 01/09/2019, Print      clindamycin (CLEOCIN) 300 MG capsule Take 1 capsule (300 mg total) by mouth 3 (three) times daily for 3 days, Starting Wed 01/09/2019, Until Sat 01/12/2019, Print      lactobacillus/streptococcus (RISAQUAD) Cap Take 1 capsule by mouth daily, Starting Wed 01/09/2019, Print         CONTINUE these medications which have NOT CHANGED     Details   acetaminophen (TYLENOL) 325 MG tablet Take 2 tablets (650 mg total) by mouth every 4 (four) hours as needed for Pain, Starting Wed 08/29/2018, Print      atorvastatin (LIPITOR) 40 MG tablet Take 40 mg by mouth daily, Historical Med      butalbital-acetaminophen-caffeine (FIORICET, ESGIC) 50-325-40 MG per tablet Take 1 tablet by mouth every 6 (six) hours as needed for Headaches, Starting Wed 08/29/2018, Print      lamoTRIgine (LAMICTAL) 150 MG tablet Take 1 tablet (150 mg total) by mouth 2 (two) times daily, Starting Wed 08/29/2018, Print      levETIRAcetam (KEPPRA) 500 MG tablet Take 500 mg by mouth 2 (two) times daily, Historical Med      metoprolol succinate XL (TOPROL-XL) 25 MG 24 hr tablet Take 25 mg by mouth daily, Historical Med      pantoprazole (PROTONIX) 40 MG tablet Take 1 tablet (40 mg total) by mouth daily, Starting Wed  08/29/2018, Print      QUEtiapine (SEROQUEL) 50 MG tablet Take 1 tablet (50 mg total) by mouth nightly, Starting Wed 08/29/2018, Print      ranolazine (RANEXA) 500 MG 12 hr tablet Take 500 mg by mouth 2 (two) times daily, Historical Med      rOPINIRole (REQUIP) 1 MG tablet Take 1 tablet (1 mg total) by mouth nightly, Starting Wed 08/29/2018, Print      furosemide (LASIX) 20 MG tablet Take 1 tablet (20 mg total) by mouth daily, Starting Thu 08/30/2018, Print      psyllium (METAMUCIL) 58.12 % Pack packet Take 1 packet by mouth daily, Starting Thu 08/30/2018, Print             This note was generated by the Epic EMR system/ Dragon speech recognition and may contain inherent errors or omissions not intended by the user. Grammatical errors, random word insertions, deletions and pronoun errors  are occasional consequences of this technology due to software limitations. Not all errors are caught or corrected. If there are questions or concerns about the content of this note or information contained within the body of this dictation they should be addressed directly with the author for  clarification.       Ronnie Derby, MD  01/10/19 1610       Ronnie Derby, MD  01/10/19 [phone]

## 2019-01-05 NOTE — ED Notes (Signed)
Patient states having Chest pain Dr Yehuda Mao aware

## 2019-01-05 NOTE — EDIE (Signed)
COLLECTIVE?NOTIFICATION?01/05/2019 21:15?[name], [name]?MRN: [id number]    [place] [facility]'s patient encounter information:   ION:?[id number]  Account [id number]  Billing Account [id number]      Criteria Met      3 Different Facilities in 90 Days    5 ED Visits in 12 Months    Security and Safety  No recent Security Events currently on file    ED Care Guidelines  There are currently no ED Care Guidelines for this patient. Please check your facility's medical records system.        Prescription Monitoring Program  431??- Narcotic Use Score  341??- Sedative Use Score  000??- Stimulant Use Score  240??- Overdose Risk Score  - All Scores range from 000-999 with 75% of the population scoring < 200 and on 1% scoring above 650  - The last digit of the narcotic, sedative, and stimulant score indicates the number of active prescriptions of that type  - Higher Use scores correlate with increased prescribers, pharmacies, mg equiv, and overlapping prescriptions  - Higher Overdose Risk Scores correlate with increased risk of unintentional overdose death   Concerning or unexpectedly high scores should prompt a review of the PMP record; this does not constitute checking PMP for prescribing purposes.      E.D. Visit Count (12 mo.)  Facility Visits   [facility] 28   [place] [facility] 1   [facility] 8   [facility] 1   Total 38   Note: Visits indicate total known visits.      Recent Emergency Department Visit Summary  Showing 10 most recent visits out of 38 in the past 12 months  Date Facility [facility] Type Diagnoses or Chief Complaint   Jan 05, 2019 [place] [facility] [place] [name] [place] [facility]      Chest Pain      Jan 03, 2019 [facility] [place] [name] [facility]      RECTAL BLEEDING/WEAKNESS      RECTAL BLEEDING WEAKNESS      Personal history of other diseases of the digestive system      Anemia,  unspecified      Long term (current) use of anticoagulants      Dec 31, 2018 [facility] [place] [name] [facility]      LIGHTHEADED, HEADACHE      ABNORMAL LAB RESULTS      Syncope and collapse      Dec 29, 2018 [facility] [place] [name] [place] [facility]      HEADACHE      Headache      2. Nausea with vomiting, unspecified      3. Visual discomfort, bilateral      4. Migraine, unsp, not intractable, without status migrainosus      5. Bipolar disorder, unspecified      6. Epilepsy, unsp, not intractable, without status epilepticus      7. Hyperlipidemia, unspecified      8. Essential (primary) hypertension      9. Paroxysmal atrial fibrillation      Dec 27, 2018 [facility] [place] [facility] [place] [facility]      MIGRAINE      HEADACHE RECURRENT OR KNOWN DX MIGRAINE      Migraine, unsp, not intractable, without status migrainosus      Dec 18, 2018 [facility] [place] [name] [facility]  RECTAL BLEEDING      Anemia, unspecified      Dec 18, 2018 [facility] [place] [name] [facility]      REACTION TO MEDICATION      ALLERGIC REACTION      Adverse effect of unsp drug/meds/biol subst, init      Anemia, unspecified      Other migraine, not intractable, without status migrainosus      1. Headache      5. Syncope and collapse      6. Other chest pain      7. Shortness of breath      8. Dizziness and giddiness      Dec 11, 2018 [facility] [place] [name] [facility]      FALL/HEAD PAIN      Unspecified fall, initial encounter      1. Headache      2. Low back pain      3. Pain in left leg      7. Pain in right leg      8. Dizziness and giddiness      9. Other visual disturbances      10. Essential (primary) hypertension      11. Hyperlipidemia, unspecified      Dec 10, 2018 [facility] [place] [name] [facility]      HEADACHE// NAUSEA// DROWSY      FALL HEADACHE NEW ONSET OR NEW SYMPTOMS      FALL HEADACHE NEW ONSET OR NEW SYMPTOM      2.  Disorientation, unspecified      3. Nausea      4. Unspecified injury of head, initial encounter      5. Paroxysmal atrial fibrillation      6. 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 Essential (primary) hypertension      8. Long term (current) use of anticoagulants      Dec 09, 2018 [facility] [place] [facility] [place] [facility]      LIGHT HEADED, HIGH BLOOD SUGAR,      Other headache syndrome      HEADACHE RECURRENT OR KNOWN DX MIGRAINE PALPITATIONS      HEADACHE RECURRENT OR KNOWN DX MIGRAINE          Recent Inpatient Visit Summary  Date Facility [facility] Type Diagnoses or Chief Complaint   Dec 18, 2018 [place] [facility] [name] [place] [facility]      RECTAL BLEEDING      Anemia, unspecified      Melena      1. Hemorrhage of anus and rectum      2. 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 Acute posthemorrhagic anemia      4. Epilepsy, unsp, not intractable, without status epilepticus      5. Paroxysmal atrial fibrillation      6. Bipolar disorder, unspecified      7. Hyperlipidemia, unspecified      Nov 21, 2018 [facility] [place] [name] [place] [facility]      ACUTE CHEST PAIN      1. Chest pain, unspecified      2. Other chest pain      3. Chronic diastolic (congestive) heart failure      4. Chronic obstructive pulmonary disease, unspecified      5.  Hypertensive heart disease with heart failure      6. Bipolar disorder, unspecified      7. 1 Type 2 diabetes mellitus without complications      8. 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 [name] [facility]      WEAKNESS OF LEFT UPPER EXTREMITY      Oth symptoms and signs involving the musculoskeletal system      Slurred speech      WEAKNESS OF LEFT UPPER EXTREMITY PROGRESSIVE FOCAL MOTO      Jul 08, 2018 [facility] [name]  [facility]      SYNCOPE      Dizziness and giddiness      SYNCOPE SLURRED SPEECH      3. Paroxysmal atrial fibrillation      4. Slurred speech      4. Syncope and collapse      5. Essential (primary) hypertension      6. Gastro-esophageal reflux disease without esophagitis      7. 1 Type 2 diabetes mellitus without complications      8. Anxiety disorder, unspecified      Jun 20, 2018 [facility] [place] [name] [facility]      CHEST PAIN      Chest pain, unspecified      CHEST PAIN CHEST PAIN      Jun 02, 2018 [facility] [place] [name] [facility]      CHEST PAIN      Chest pain, unspecified      CHEST PAIN CHEST PAIN      2. Other chest pain      3. Chronic obstructive pulmonary disease, unspecified      4. Paroxysmal atrial fibrillation      5. Essential (primary) hypertension      6. Bipolar disorder, unspecified      7. Anxiety disorder, unspecified      8. Hyperlipidemia, unspecified      Apr 02, 2018 [facility] [name] [facility]      CHEST PAIN ADULT SHORTNESS OF BREATH          Care Team  There is not a care team on record at this time.   Collective Portal  This patient has registered at the [facility] Emergency Department   For more information visit: [email or web address]     PLEASE NOTE:     1.   Any care recommendations and other clinical information are provided as guidelines or for historical purposes only, and providers should exercise their own clinical judgment when providing care.    2.   You may only use this information for purposes of treatment, payment or health care operations activities, and subject to the limitations of applicable Collective Policies.    3.   You should consult directly with the organization that provided a care guideline or other clinical history with any questions about additional information or accuracy or completeness of information  provided.    ? 2020 [place], [employer] - [email or web address]

## 2019-01-06 ENCOUNTER — Other Ambulatory Visit: Payer: Medicare Other

## 2019-01-06 ENCOUNTER — Observation Stay: Payer: Medicare Other

## 2019-01-06 ENCOUNTER — Emergency Department: Payer: Medicare Other

## 2019-01-06 DIAGNOSIS — J45909 Unspecified asthma, uncomplicated: Secondary | ICD-10-CM

## 2019-01-06 DIAGNOSIS — I480 Paroxysmal atrial fibrillation: Secondary | ICD-10-CM

## 2019-01-06 DIAGNOSIS — R079 Chest pain, unspecified: Secondary | ICD-10-CM

## 2019-01-06 DIAGNOSIS — I351 Nonrheumatic aortic (valve) insufficiency: Secondary | ICD-10-CM

## 2019-01-06 DIAGNOSIS — G40909 Epilepsy, unspecified, not intractable, without status epilepticus: Secondary | ICD-10-CM

## 2019-01-06 DIAGNOSIS — F32A Depression, unspecified: Secondary | ICD-10-CM

## 2019-01-06 DIAGNOSIS — Z8719 Personal history of other diseases of the digestive system: Secondary | ICD-10-CM

## 2019-01-06 DIAGNOSIS — E785 Hyperlipidemia, unspecified: Secondary | ICD-10-CM

## 2019-01-06 DIAGNOSIS — D649 Anemia, unspecified: Secondary | ICD-10-CM

## 2019-01-06 DIAGNOSIS — I10 Essential (primary) hypertension: Secondary | ICD-10-CM

## 2019-01-06 LAB — COMPREHENSIVE METABOLIC PANEL
ALT: 12 U/L (ref 0–55)
AST (SGOT): 16 U/L (ref 5–34)
Albumin/Globulin Ratio: 1.1 (ref 0.9–2.2)
Albumin: 3.4 g/dL — ABNORMAL LOW (ref 3.5–5.0)
Alkaline Phosphatase: 80 U/L (ref 37–106)
Anion Gap: 12 (ref 5.0–15.0)
BUN: 6 mg/dL — ABNORMAL LOW (ref 7–19)
Bilirubin, Total: 0.2 mg/dL (ref 0.2–1.2)
CO2: 25 mEq/L (ref 22–29)
Calcium: 9.1 mg/dL (ref 8.5–10.5)
Chloride: 103 mEq/L (ref 100–111)
Creatinine: 0.8 mg/dL (ref 0.6–1.0)
Globulin: 3.1 g/dL (ref 2.0–3.6)
Glucose: 99 mg/dL (ref 70–100)
Potassium: 4.2 mEq/L (ref 3.5–5.1)
Protein, Total: 6.5 g/dL (ref 6.0–8.3)
Sodium: 140 mEq/L (ref 136–145)

## 2019-01-06 LAB — CBC AND DIFFERENTIAL
Absolute NRBC: 0 10*3/uL (ref 0.00–0.00)
Basophils Absolute Automated: 0.04 10*3/uL (ref 0.00–0.08)
Basophils Automated: 0.6 %
Eosinophils Absolute Automated: 0.25 10*3/uL (ref 0.00–0.44)
Eosinophils Automated: 3.9 %
Hematocrit: 31.7 % — ABNORMAL LOW (ref 34.7–43.7)
Hgb: 9.9 g/dL — ABNORMAL LOW (ref 11.4–14.8)
Immature Granulocytes Absolute: 0.03 10*3/uL (ref 0.00–0.07)
Immature Granulocytes: 0.5 %
Lymphocytes Absolute Automated: 2.35 10*3/uL (ref 0.42–3.22)
Lymphocytes Automated: 36.7 %
MCH: 26.1 pg (ref 25.1–33.5)
MCHC: 31.2 g/dL — ABNORMAL LOW (ref 31.5–35.8)
MCV: 83.4 fL (ref 78.0–96.0)
MPV: 9.8 fL (ref 8.9–12.5)
Monocytes Absolute Automated: 0.51 10*3/uL (ref 0.21–0.85)
Monocytes: 8 %
Neutrophils Absolute: 3.23 10*3/uL (ref 1.10–6.33)
Neutrophils: 50.3 %
Nucleated RBC: 0 /100 WBC (ref 0.0–0.0)
Platelets: 290 10*3/uL (ref 142–346)
RBC: 3.8 10*6/uL — ABNORMAL LOW (ref 3.90–5.10)
RDW: 16 % — ABNORMAL HIGH (ref 11–15)
WBC: 6.41 10*3/uL (ref 3.10–9.50)

## 2019-01-06 LAB — HEMOGLOBIN A1C
Average Estimated Glucose: 119.8 mg/dL
Hemoglobin A1C: 5.8 % (ref 4.6–5.9)

## 2019-01-06 LAB — LIPID PANEL
Cholesterol / HDL Ratio: 2.4
Cholesterol: 130 mg/dL (ref 0–199)
HDL: 55 mg/dL (ref 40–9999)
LDL Calculated: 61 mg/dL (ref 0–99)
Triglycerides: 68 mg/dL (ref 34–149)
VLDL Calculated: 14 mg/dL (ref 10–40)

## 2019-01-06 LAB — TROPONIN I
Troponin I: <0.01 ng/mL (ref 0.00–0.05)
Troponin I: <0.01 ng/mL (ref 0.00–0.05)

## 2019-01-06 LAB — HEMOLYSIS INDEX: Hemolysis Index: 5 (ref 0–18)

## 2019-01-06 LAB — GFR: EGFR: >60

## 2019-01-06 LAB — MAGNESIUM: Magnesium: 1.7 mg/dL (ref 1.6–2.6)

## 2019-01-06 MED ORDER — OXYCODONE-ACETAMINOPHEN 5-325 MG PO TABS
1.0000 | ORAL_TABLET | Freq: Three times a day (TID) | ORAL | Status: DC | PRN
Start: 2019-01-06 — End: 2019-01-09
  Administered 2019-01-06 – 2019-01-09 (×5): 1 via ORAL
  Filled 2019-01-06 (×5): qty 1

## 2019-01-06 MED ORDER — ALBUTEROL SULFATE 1.25 MG/3ML IN NEBU
1.25 mg | INHALATION_SOLUTION | Freq: Four times a day (QID) | RESPIRATORY_TRACT | Status: DC | PRN
Start: 2019-01-06 — End: 2019-01-09

## 2019-01-06 MED ORDER — PANTOPRAZOLE SODIUM 40 MG PO TBEC
40.00 mg | DELAYED_RELEASE_TABLET | Freq: Every morning | ORAL | Status: DC
Start: 2019-01-06 — End: 2019-01-09
  Administered 2019-01-06 – 2019-01-09 (×4): 40 mg via ORAL
  Filled 2019-01-06 (×5): qty 1

## 2019-01-06 MED ORDER — MORPHINE SULFATE 4 MG/ML IJ/IV SOLN (WRAP)
4.0000 mg | Freq: Once | Status: AC
Start: 2019-01-06 — End: 2019-01-06
  Administered 2019-01-06: 4 mg via INTRAVENOUS
  Filled 2019-01-06: qty 1

## 2019-01-06 MED ORDER — OXYCODONE-ACETAMINOPHEN 5-325 MG PO TABS
1.00 | ORAL_TABLET | Freq: Once | ORAL | Status: AC
Start: 2019-01-06 — End: 2019-01-06
  Administered 2019-01-06: 1 via ORAL
  Filled 2019-01-06: qty 1

## 2019-01-06 MED ORDER — SODIUM CHLORIDE 0.9 % IV SOLN
INTRAVENOUS | Status: DC
Start: 2019-01-06 — End: 2019-01-09
  Administered 2019-01-08: 75 mL/h via INTRAVENOUS

## 2019-01-06 MED ORDER — CLINDAMYCIN PHOSPHATE IN D5W 300 MG/50ML IV SOLN
300.00 mg | Freq: Three times a day (TID) | INTRAVENOUS | Status: DC
Start: 2019-01-06 — End: 2019-01-09
  Administered 2019-01-06 – 2019-01-09 (×10): 300 mg via INTRAVENOUS
  Filled 2019-01-06 (×12): qty 50

## 2019-01-06 MED ORDER — AMLODIPINE BESYLATE 2.5 MG PO TABS
2.5000 mg | ORAL_TABLET | Freq: Every day | ORAL | Status: DC
Start: 2019-01-06 — End: 2019-01-09
  Administered 2019-01-06 – 2019-01-08 (×3): 2.5 mg via ORAL
  Filled 2019-01-06 (×5): qty 1

## 2019-01-06 MED ORDER — DIPHENHYDRAMINE HCL 50 MG/ML IJ SOLN
25.00 mg | Freq: Once | INTRAMUSCULAR | Status: AC
Start: 2019-01-06 — End: 2019-01-06
  Administered 2019-01-06: 25 mg via INTRAVENOUS
  Filled 2019-01-06: qty 1

## 2019-01-06 MED ORDER — METOPROLOL SUCCINATE ER 25 MG PO TB24
25.00 mg | ORAL_TABLET | Freq: Every day | ORAL | Status: DC
Start: 2019-01-06 — End: 2019-01-09
  Administered 2019-01-06 – 2019-01-08 (×3): 25 mg via ORAL
  Filled 2019-01-06 (×5): qty 1

## 2019-01-06 MED ORDER — MORPHINE SULFATE 2 MG/ML IJ/IV SOLN (WRAP)
2.0000 mg | Freq: Three times a day (TID) | Status: AC | PRN
Start: 2019-01-06 — End: 2019-01-07
  Administered 2019-01-06 – 2019-01-07 (×3): 2 mg via INTRAVENOUS
  Filled 2019-01-06 (×3): qty 1

## 2019-01-06 MED ORDER — LEVETIRACETAM 500 MG PO TABS
500.0000 mg | ORAL_TABLET | Freq: Two times a day (BID) | ORAL | Status: DC
Start: 2019-01-06 — End: 2019-01-09
  Administered 2019-01-06 – 2019-01-09 (×7): 500 mg via ORAL
  Filled 2019-01-06 (×8): qty 1

## 2019-01-06 MED ORDER — ATORVASTATIN CALCIUM 40 MG PO TABS
40.0000 mg | ORAL_TABLET | Freq: Every day | ORAL | Status: DC
Start: 2019-01-06 — End: 2019-01-09
  Administered 2019-01-06 – 2019-01-08 (×3): 40 mg via ORAL
  Filled 2019-01-06 (×3): qty 1

## 2019-01-06 MED ORDER — NALOXONE HCL 0.4 MG/ML IJ SOLN (WRAP)
0.20 mg | INTRAMUSCULAR | Status: DC | PRN
Start: 2019-01-06 — End: 2019-01-09

## 2019-01-06 MED ORDER — ONDANSETRON HCL 4 MG/2ML IJ SOLN
4.00 mg | Freq: Three times a day (TID) | INTRAMUSCULAR | Status: DC | PRN
Start: 2019-01-06 — End: 2019-01-09
  Administered 2019-01-06 – 2019-01-08 (×3): 4 mg via INTRAVENOUS
  Filled 2019-01-06 (×3): qty 2

## 2019-01-06 MED ORDER — LAMOTRIGINE 100 MG PO TABS
100.0000 mg | ORAL_TABLET | Freq: Two times a day (BID) | ORAL | Status: DC
Start: 2019-01-06 — End: 2019-01-09
  Administered 2019-01-06 – 2019-01-09 (×8): 100 mg via ORAL
  Filled 2019-01-06 (×10): qty 1

## 2019-01-06 MED ORDER — METRONIDAZOLE IN NACL 500 MG/100 ML IV SOLN
500.00 mg | Freq: Three times a day (TID) | INTRAVENOUS | Status: DC
Start: 2019-01-06 — End: 2019-01-06
  Administered 2019-01-06: 500 mg via INTRAVENOUS
  Filled 2019-01-06 (×4): qty 100

## 2019-01-06 MED ORDER — RANOLAZINE ER 500 MG PO TB12
500.00 mg | ORAL_TABLET | Freq: Two times a day (BID) | ORAL | Status: DC
Start: 2019-01-06 — End: 2019-01-09
  Administered 2019-01-06 – 2019-01-09 (×5): 500 mg via ORAL
  Filled 2019-01-06 (×8): qty 1

## 2019-01-06 MED ORDER — HEPARIN SODIUM (PORCINE) 5000 UNIT/ML IJ SOLN
5000.00 [IU] | Freq: Three times a day (TID) | INTRAMUSCULAR | Status: DC
Start: 2019-01-06 — End: 2019-01-06
  Administered 2019-01-06: 5000 [IU] via SUBCUTANEOUS
  Filled 2019-01-06: qty 1

## 2019-01-06 MED ORDER — DIPHENHYDRAMINE HCL 25 MG PO CAPS
25.00 mg | ORAL_CAPSULE | Freq: Once | ORAL | Status: AC
Start: 2019-01-06 — End: 2019-01-06
  Administered 2019-01-06: 25 mg via ORAL
  Filled 2019-01-06: qty 1

## 2019-01-06 MED ORDER — CHLORHEXIDINE GLUCONATE 0.12 % MT SOLN
10.00 mL | Freq: Two times a day (BID) | OROMUCOSAL | Status: DC
Start: 2019-01-06 — End: 2019-01-09
  Administered 2019-01-06 – 2019-01-09 (×7): 10 mL via OROMUCOSAL
  Filled 2019-01-06 (×8): qty 15

## 2019-01-06 MED ORDER — ROPINIROLE HCL 0.25 MG PO TABS
0.5000 mg | ORAL_TABLET | Freq: Every evening | ORAL | Status: DC
Start: 2019-01-06 — End: 2019-01-09
  Administered 2019-01-06 – 2019-01-08 (×3): 0.5 mg via ORAL
  Filled 2019-01-06 (×3): qty 2

## 2019-01-06 MED ORDER — DIPHENHYDRAMINE HCL 25 MG PO CAPS
25.00 mg | ORAL_CAPSULE | Freq: Four times a day (QID) | ORAL | Status: DC | PRN
Start: 2019-01-06 — End: 2019-01-06
  Filled 2019-01-06: qty 1

## 2019-01-06 MED ORDER — QUETIAPINE FUMARATE 25 MG PO TABS
50.0000 mg | ORAL_TABLET | Freq: Every evening | ORAL | Status: DC
Start: 2019-01-06 — End: 2019-01-09
  Administered 2019-01-06 – 2019-01-08 (×3): 50 mg via ORAL
  Filled 2019-01-06 (×3): qty 2

## 2019-01-06 MED ORDER — DIPHENHYDRAMINE HCL 50 MG/ML IJ SOLN
6.25 mg | Freq: Three times a day (TID) | INTRAMUSCULAR | Status: DC | PRN
Start: 2019-01-06 — End: 2019-01-07
  Administered 2019-01-06 – 2019-01-07 (×5): 6.25 mg via INTRAVENOUS
  Filled 2019-01-06 (×5): qty 1

## 2019-01-06 MED ORDER — LEVOFLOXACIN IN D5W 500 MG/100ML IV SOLN
500.00 mg | INTRAVENOUS | Status: DC
Start: 2019-01-06 — End: 2019-01-06
  Administered 2019-01-06: 500 mg via INTRAVENOUS
  Filled 2019-01-06 (×2): qty 100

## 2019-01-06 MED ORDER — IOHEXOL 350 MG/ML IV SOLN
100.00 mL | Freq: Once | INTRAVENOUS | Status: AC | PRN
Start: 2019-01-06 — End: 2019-01-06
  Administered 2019-01-06: 100 mL via INTRAVENOUS

## 2019-01-06 MED ORDER — APIXABAN 5 MG PO TABS
5.0000 mg | ORAL_TABLET | Freq: Two times a day (BID) | ORAL | Status: DC
Start: 2019-01-06 — End: 2019-01-09
  Administered 2019-01-06 – 2019-01-09 (×7): 5 mg via ORAL
  Filled 2019-01-06 (×9): qty 1

## 2019-01-06 NOTE — UM Notes (Signed)
Observation Status Review    CC: Chest Pain    63 y.o. female with past medical history of Hypertension,Paroxysmal atrial fibrillation on eliquis,Hyperlipidemia, asthma, type 2 diabetes, Depression,  H/o GIB, and seizure disorder presenting with a compliant of chest pain that started yesterday morning. Patient reported substernal pressure like chest tightness radiating to the left jaw. Pain is intermittent and lasts for about  3 minutes. She rated her pain 6/10. She denies shortness of breath, cough, or fever. Denies urine or bowel habit changes.     VS:   01/05/19 2118 131/67 100 F (37.8 C) 95 20 99 %     Labs:  Glucose 107     Albumin 3.4     Bilirubin, Total 0.1     Hgb 10.0 g/dL      Hematocrit 32.3     RBC 3.82     MCHC 31.2 g/dL      RDW 16     Meds:  Benadryl 25mg  IV x1  Morphine 4mg  IV x1    EKG shows sinus rhythm with non specific T wave abnormality.  Imaging personally reviewed, including: all available   Xr Chest  Ap Portable    Result Date: 01/06/2019  No acute process. Adaline Sill, MD 01/06/2019 12:22 AM    Plan:   -Admit to [facility] Hospitalist service    1. Chest pain  - Presented with substernal chest pain that started yesterday  - Currently her chest pain has improved  - Initial troponin negative  - EKG shows sinus rhythm with non specific changes  - She has received Plavix 300 mg in the ED [ allergic to ASA]  - Check lipid panel and HbA1C  - Trend troponin  - Continue Metoprolol and Lipitor  - Consider cardiology consult in AM    2. Hypertension  - On Metoprolol  - Monitor BP    3. Paroxysmal atrial fibrillation   - Her Eliquis is on hold for planned Port line placement   - Continue Metoprolol  - Monitor on telemetry    4. Hyperlipidemia  - On Lipitor    5. Asthma  - Stable. Neb treatment as needed    6. Type 2 diabetes  - Diet controlled  - Monitor FSG    7. Seizure disorder   - Continue Keppra and Lamictal  - Seizure precaution    8. Depression  - Resume home medications in AM     9. History of GI Bleed  - H and H at baseline  - She stated that she will have GI evaluation [ with enteroscopy] at [facility] after she got port line placement on 03/18.  - Check stool for occult blood. Monitor H and H    10. Restless leg syndrome  - On Requip    11. Left sided Jaw pain       ?? Parotitis, rule out  sialoadenitis  - In the ED, she was noted to have some tenderness of the left parotid area with spasm of the neck muscle and received Clindamycin, Flexeril, and Benadryl.   - Currently improving. Has mild fever or leukocytosis..  - Will continue with Levaquin for now.  Reevaluate in AM. Continue to monitor    12. DVT Prophylaxis- SQ heparin     Place in Observation Services (Order [id number])   Ordered On Ordered By    01/06/2019 1:27 AM Ronnie Derby, MD

## 2019-01-06 NOTE — Plan of Care (Signed)
Problem: Pain  Goal: Pain at adequate level as identified by patient  Flowsheets (Taken 01/06/2019 1748)  Pain at adequate level as identified by patient: Identify patient comfort function goal; Assess for risk of opioid induced respiratory depression, including snoring/sleep apnea. Alert healthcare team of risk factors identified.; Assess pain on admission, during daily assessment and/or before any "as needed" intervention(s); Reassess pain within 30-60 minutes of any procedure/intervention, per Pain Assessment, Intervention, Reassessment (AIR) Cycle; Evaluate if patient comfort function goal is met; Evaluate patient's satisfaction with pain management progress; Consult/collaborate with Pain Service; Offer non-pharmacological pain management interventions; Consult/collaborate with Physical Therapy, Occupational Therapy, and/or Speech Therapy     Problem: Side Effects from Pain Analgesia  Goal: Patient will experience minimal side effects of analgesic therapy  Flowsheets (Taken 01/06/2019 1748)  Patient will experience minimal side effects of analgesic therapy: Monitor/assess patient's respiratory status (RR depth, effort, breath sounds); Assess for changes in cognitive function; Evaluate for opioid-induced sedation with appropriate assessment tool (i.e. POSS)     Problem: Psychosocial and Spiritual Needs  Goal: Demonstrates ability to cope with hospitalization/illness  Flowsheets (Taken 01/06/2019 1748)  Demonstrates ability to cope with hospitalizations/illness: Encourage verbalization of feelings/concerns/expectations; Assist patient to identify own strengths and abilities; Reinforce positive adaptation of new coping behaviors; Communicate referral to spiritual care as appropriate; Include patient/ patient care companion in decisions; Encourage patient to set small goals for self      Patient agrees to the care plan and goals.

## 2019-01-06 NOTE — Consults (Signed)
CARDIOLOGY HOSPITAL NOTE  Date:  01/06/2019  10:40 AM    Lanny Lipkin Alyson Locket, MD [facility] Cardiology  Office phone:  [phone]     [facility] phone [phone], [facility] phone [phone]    Patient:  [name].  DOB: Mar 09, 1956.  63 y.o.  female  Date of Admission:  01/05/2019  Attending:  Delaine Lame, MD     REASON FOR CONSULTATION:   Patient was seen in consultation at the request of Dr. Bluford Main     ASSESSMENT & PLAN:   63 y.o. 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 Reviewed records from her cardiologist (Dr. Aneta Mins) which documented a negative nuclear stress test on 06/20/2018.  Cardiac catheterization in 2014 and in 2017 were negative for CAD.  ECHO 07/09/2018 showed normal EF and moderate AR.  The patient is being treated for microvascular angina.  Given the atypical nature of her chest pain and normal troponins and EKG, I do not feel that further testing is necessary at this time.  We will place her back on her previous cardiac meds including amlodipine, Eliquis, Lipitor, metoprolol, and ranolazine.  She can be d/c'ed from the cardiac standpoint and follow up with Dr. Aneta Mins    HISTORY OF PRESENT ILLNESS / SUBJECTIVE:   Presents with complaint of chest pain, intermittent over the past 2 days, described as a severe pressure in the upper chest, nonexertional, without associated shortness of breath.  She also complains of left neck and jaw swelling.  There is no relieving factor.  Patient states that a stress test 1 year ago at [facility] was normal.    REVIEW OF SYSTEMS:                                                  In addition to systems noted in HPI:   General:   no fatigue.   Cardiac:   positive CP.   no palpitations.   Pulmonary:   no SOB.    Neuro:   no syncope.   Heme:   no bleeding.   All other systems negative.       DIAGNOSTICS:                                                 ECG:  05-Jan-2019 21:21:24 Normal sinus rhythm.   Normal QRS.   No ischemic ST changes.           PAST MEDICAL HISTORY:   hypertension, pAFib on eliquis, hyperlipidemia, asthma, type 2 diabetes, HTN, TIA, and seizure disorder    SOCIAL HISTORY:   Never smoker    FAMILY HISTORY:     No CAD    PHYSICAL EXAM:  BP 119/73   Pulse 84   Temp 98.4 F (36.9 C) (Oral)   Resp (!) 24   Ht 1.651 m (5\' 5" )   Wt 98.2 kg (216 lb 6.4 oz)   LMP  (LMP Unknown)   SpO2 99%   BMI 36.01 kg/m  No intake or output data in the 24 hours ending 01/06/19 1040  >> General:   no distress,  >> Eyes:   anicteric,  >> ENMT:   moist mucosa,  >> Respiratory:   unlabored respiration,  no wheeze,  no crackles,  >> Cardiovascular:   regular rhythm,  No LE edema,  >> Gastrointestinal:   Abdomen soft and nontender,  >> Musculoskeletal:   normal muscle tone and strength upper extremities >> Skin:   warm and dry,  >> Neuro/Psych:   Alert,  Appropriate mood and affect,      LABORATORY:     CBC w/Diff   Recent Labs   Lab 01/06/19  0400 01/05/19  2213   WBC 6.41 6.68   Hgb 9.9* 10.0*   Hematocrit 31.7* 32.1*   Platelets 290 221        Basic Metabolic Profile   Recent Labs   Lab 01/06/19  0400 01/05/19  2213   Sodium 140 139   Potassium 4.2 4.2   Chloride 103 103   CO2 25 25   BUN 6* 7   Creatinine 0.8 0.9   EGFR >60.0 >60.0   Glucose 99 107*   Calcium 9.1 9.4        Cardiac Enzymes   Recent Labs   Lab 01/06/19  0608 01/06/19  0400 01/05/19  2213   Troponin I <0.01 <0.01 <0.01     Recent Labs   Lab 01/05/19  2213   B-Natriuretic Peptide <10        Coagulation Studies   Recent Labs   Lab 01/05/19  2213   PT 12.6   PT INR 1.0   PTT 22*        ALLERGIES:     Allergies   Allergen Reactions   . Singulair [Montelukast] Itching   . Latex    .  Nitroglycerin    . Penicillins    . Tetracyclines & Related         MEDICATIONS:    INFUSION MEDS:      SCHEDULED MEDS:     Current Facility-Administered Medications   Medication Dose Route Frequency   . atorvastatin  40 mg Oral Daily   . chlorhexidine  10 mL Mouth/Throat Q12H SCH   . heparin (porcine)  5,000 Units Subcutaneous Q8H SCH   . lamoTRIgine  100 mg Oral BID   . levETIRAcetam  500 mg Oral BID   . levoFLOXacin  500 mg Intravenous Q24H   . metoprolol succinate XL  25 mg Oral Daily   . metroNIDAZOLE  500 mg Intravenous Q8H   . pantoprazole  40 mg Oral QAM AC   . QUEtiapine  50 mg Oral QHS   . ranolazine  500 mg Oral BID   . rOPINIRole  0.5 mg Oral QHS      PRN MEDS:   albuterol, diphenhydrAMINE, naloxone    MISCELLANEOUS:

## 2019-01-06 NOTE — ED Notes (Addendum)
Patient states has itching and Chest pain 7/10 dr Yehuda Mao made aware

## 2019-01-06 NOTE — Progress Notes (Signed)
Patient alert and oriented x 4 at this time. Complains of back and head pain. Pain medications requested and given as ordered. Safety needs discussed and addressed. Patient had the Ct of the neck today, she was kept NPO prior to the procedure .Extensive teaching on  Medications, Ct test and pain medications completed. Patient understood and all her questions were answered.

## 2019-01-06 NOTE — Plan of Care (Signed)
Problem: Safety  Goal: Patient will be free from injury during hospitalization  Outcome: Progressing  Flowsheets (Taken 01/06/2019 0353)  Patient will be free from injury during hospitalization : Assess patient's risk for falls and implement fall prevention plan of care per policy; Provide and maintain safe environment; Ensure appropriate safety devices are available at the bedside; Use appropriate transfer methods; Hourly rounding; Include patient/ family/ care giver in decisions related to safety; Assess for patients risk for elopement and implement Elopement Risk Plan per policy; Provide alternative method of communication if needed (communication boards, writing)  Goal: Patient will be free from infection during hospitalization  Outcome: Progressing  Flowsheets (Taken 01/06/2019 0353)  Free from Infection during hospitalization: Assess and monitor for signs and symptoms of infection; Monitor lab/diagnostic results; Encourage patient and family to use good hand hygiene technique     Problem: Safety  Goal: Patient will be free from injury during hospitalization  Outcome: Progressing  Flowsheets (Taken 01/06/2019 0353)  Patient will be free from injury during hospitalization : Assess patient's risk for falls and implement fall prevention plan of care per policy; Provide and maintain safe environment; Ensure appropriate safety devices are available at the bedside; Use appropriate transfer methods; Hourly rounding; Include patient/ family/ care giver in decisions related to safety; Assess for patients risk for elopement and implement Elopement Risk Plan per policy; Provide alternative method of communication if needed (communication boards, writing)     Problem: Safety  Goal: Patient will be free from infection during hospitalization  Outcome: Progressing  Flowsheets (Taken 01/06/2019 0353)  Free from Infection during hospitalization: Assess and monitor for signs and symptoms of infection; Monitor lab/diagnostic  results; Encourage patient and family to use good hand hygiene technique     Problem: Pain  Goal: Pain at adequate level as identified by patient  Flowsheets (Taken 01/06/2019 0353)  Pain at adequate level as identified by patient: Identify patient comfort function goal; Assess for risk of opioid induced respiratory depression, including snoring/sleep apnea. Alert healthcare team of risk factors identified.; Assess pain on admission, during daily assessment and/or before any "as needed" intervention(s); Reassess pain within 30-60 minutes of any procedure/intervention, per Pain Assessment, Intervention, Reassessment (AIR) Cycle; Evaluate if patient comfort function goal is met; Evaluate patient's satisfaction with pain management progress; Offer non-pharmacological pain management interventions; Consult/collaborate with Physical Therapy, Occupational Therapy, and/or Speech Therapy; Consult/collaborate with Pain Service; Include patient/patient care companion in decisions related to pain management as needed     Problem: Side Effects from Pain Analgesia  Goal: Patient will experience minimal side effects of analgesic therapy  Flowsheets (Taken 01/06/2019 0353)  Patient will experience minimal side effects of analgesic therapy: Monitor/assess patient's respiratory status (RR depth, effort, breath sounds); Assess for changes in cognitive function; Prevent/manage side effects per LIP orders (i.e. nausea, vomiting, pruritus, constipation, urinary retention, etc.); Evaluate for opioid-induced sedation with appropriate assessment tool (i.e. POSS)     Problem: Pain  Goal: Pain at adequate level as identified by patient  Flowsheets (Taken 01/06/2019 0353)  Pain at adequate level as identified by patient: Identify patient comfort function goal; Assess for risk of opioid induced respiratory depression, including snoring/sleep apnea. Alert healthcare team of risk factors identified.; Assess pain on admission, during daily  assessment and/or before any "as needed" intervention(s); Reassess pain within 30-60 minutes of any procedure/intervention, per Pain Assessment, Intervention, Reassessment (AIR) Cycle; Evaluate if patient comfort function goal is met; Evaluate patient's satisfaction with pain management progress; Offer non-pharmacological pain management interventions;  Consult/collaborate with Physical Therapy, Occupational Therapy, and/or Speech Therapy; Consult/collaborate with Pain Service; Include patient/patient care companion in decisions related to pain management as needed     Problem: Side Effects from Pain Analgesia  Goal: Patient will experience minimal side effects of analgesic therapy  Flowsheets (Taken 01/06/2019 0353)  Patient will experience minimal side effects of analgesic therapy: Monitor/assess patient's respiratory status (RR depth, effort, breath sounds); Assess for changes in cognitive function; Prevent/manage side effects per LIP orders (i.e. nausea, vomiting, pruritus, constipation, urinary retention, etc.); Evaluate for opioid-induced sedation with appropriate assessment tool (i.e. POSS)     Problem: Side Effects from Pain Analgesia  Goal: Patient will experience minimal side effects of analgesic therapy  Flowsheets (Taken 01/06/2019 0353)  Patient will experience minimal side effects of analgesic therapy: Monitor/assess patient's respiratory status (RR depth, effort, breath sounds); Assess for changes in cognitive function; Prevent/manage side effects per LIP orders (i.e. nausea, vomiting, pruritus, constipation, urinary retention, etc.); Evaluate for opioid-induced sedation with appropriate assessment tool (i.e. POSS)

## 2019-01-06 NOTE — H&P (Addendum)
ADMISSION HISTORY AND PHYSICAL EXAM    [facility], DIVISION OF HOSPITALIST MEDICINE   [facility]   [place] Pager: [phone]      Date Time: 01/06/19 1:38 AM  Patient Name: [name]  Attending Physician: Ronnie Derby, MD  Primary Care Physician: Malcolm Metro, MD    CC: Chest Pain    Assessment:     Active Hospital Problems    Diagnosis   . Chest pain with high risk of acute coronary syndrome       Chest pain     Plan:   -Admit to [facility] Hospitalist service    1. Chest pain  - Presented with substernal chest pain that started yesterday  - Currently her chest pain has improved  - Initial troponin negative  - EKG shows sinus rhythm with non specific changes  - She has received Plavix 300 mg in the ED [ allergic to ASA]  - Check lipid panel and HbA1C  - Trend troponin  - Continue Metoprolol and Lipitor  - Consider cardiology consult in AM    2. Hypertension  - On Metoprolol  - Monitor BP    3. Paroxysmal atrial fibrillation   - Her Eliquis is on hold for planned Port line placement   - Continue Metoprolol  - Monitor on telemetry    4. Hyperlipidemia  - On Lipitor    5. Asthma  - Stable. Neb treatment as needed    6. Type 2 diabetes  - Diet controlled  - Monitor FSG    7. Seizure disorder   - Continue Keppra and Lamictal  - Seizure precaution    8. Depression  - Resume home medications in AM    9. History of GI Bleed  - H and H at baseline  - She stated that she will have GI evaluation [ with enteroscopy] at [facility] after she got port line placement on 03/18.  - Check stool for occult blood. Monitor H and H    10. Restless leg syndrome  - On Requip    11. Left sided Jaw pain       ?? Parotitis, rule out  sialoadenitis  - In the ED, she was noted to have some tenderness of the left parotid area with spasm of the neck muscle and received Clindamycin, Flexeril, and Benadryl.   - Currently improving. Has mild fever or leukocytosis..  - Will continue with Levaquin for now.  Reevaluate in AM.  Continue to monitor    12. DVT Prophylaxis- SQ heparin       Disposition:     Anticipated medical stability for discharge: TBD  Service status: Observation:patient is stable and improving.    History of Presenting Illness:   [name] is a 63 y.o. female with past medical history of Hypertension, Paroxysmal atrial fibrillation on eliquis, Hyperlipidemia, asthma, type 2 diabetes, Depression,  H/o GIB, and seizure disorder presenting with a compliant of chest pain that started yesterday morning. Patient reported substernal pressure like chest tightness radiating to the left jaw. Pain is intermittent and lasts for about  3 minutes. She rated her pain 6/10. She denies shortness of breath, cough, or fever. Denies urine or bowel habit changes. She reported GI bleeding with maroon colored stool and stated that she will have GI evaluation at [facility] after she got port line placement on 03/18. She stated that her eliquis is on hold since Friday for the port placement. Echo from 07/09/18 shows EF 55 %.  On presentation, BP 125/77 HR 82. Her lab shows WBC 6.6, Hemoglobin 10.0, Creatinine 0.7, negative initial troponin. EKG shows sinus rhythm with non specific T wave abnormality. No STEMI. CXR was unremarkable.  She was given Plavix 300 mg, Clindamycin, Flexeril, and Benadryl in the ED.       Past Medical History:     Past Medical History:   Diagnosis Date   . Asthma    . Convulsions    . Diabetes mellitus     diet controlled    . Hyperlipidemia    . Hypertension    . TIA (transient ischemic attack)          Available old records reviewed, including: EPIC     Past Surgical History:     Past Surgical History:   Procedure Laterality Date   . HYSTERECTOMY     . pci         Family History:   No family history on file.    Social History:    reports that she has never smoked. She has never used smokeless tobacco. She reports that she does not drink alcohol or use drugs.    Allergies:     Allergies   Allergen Reactions   . Singulair  [Montelukast] Itching   . Latex    . Nitroglycerin    . Penicillins    . Tetracyclines & Related        Medications:     Home Medications     Med List Status:  In Progress Set By: Wynetta Emery, RN at 01/05/2019  9:23 PM                acetaminophen (TYLENOL) 325 MG tablet     Take 2 tablets (650 mg total) by mouth every 4 (four) hours as needed for Pain     butalbital-acetaminophen-caffeine (FIORICET, ESGIC) 50-325-40 MG per tablet     Take 1 tablet by mouth every 6 (six) hours as needed for Headaches     furosemide (LASIX) 20 MG tablet     Take 1 tablet (20 mg total) by mouth daily     lamoTRIgine (LAMICTAL) 150 MG tablet     Take 1 tablet (150 mg total) by mouth 2 (two) times daily     pantoprazole (PROTONIX) 40 MG tablet     Take 1 tablet (40 mg total) by mouth daily     psyllium (METAMUCIL) 58.12 % Pack packet     Take 1 packet by mouth daily     QUEtiapine (SEROQUEL) 50 MG tablet     Take 1 tablet (50 mg total) by mouth nightly     rOPINIRole (REQUIP) 1 MG tablet     Take 1 tablet (1 mg total) by mouth nightly          Method by which medications were confirmed on admission: Patient       Review of Systems:   All other systems were reviewed and are negative except:as above in HPI     Physical Exam:     Patient Vitals for the past 24 hrs:   BP Temp Pulse Resp SpO2 Height Weight   01/06/19 0111 133/58 98.6 F (37 C) 81 20 99 % - -   01/06/19 0030 124/60 - 75 15 95 % - -   01/06/19 0000 138/65 - 80 19 99 % - -   01/05/19 2353 128/61 98.7 F (37.1 C) 81 20 99 % - -   01/05/19 2331  124/61 - 82 17 99 % - -   01/05/19 2300 104/67 - 80 12 98 % - -   01/05/19 2230 105/63 - 85 20 99 % - -   01/05/19 2223 125/77 98 F (36.7 C) 82 20 99 % - -   01/05/19 2124 131/67 - 97 17 99 % - -   01/05/19 2118 131/67 100 F (37.8 C) 95 20 99 % 1.651 m (5\' 5" ) 95.3 kg (210 lb)     Body mass index is 34.95 kg/m.  No intake or output data in the 24 hours ending 01/06/19 0138    General: awake;  no acute distress.  HEENT: perrla,  eomi, sclera anicteric  oropharynx clear without lesions, mucous membranes moist  Neck: supple, no lymphadenopathy, no JVD, no carotid bruits. Mild enlargement of left parotid with minimal tenderness,   Cardiovascular: Normal S1 and S2, no murmurs, rubs or gallops  Lungs: clear to auscultation bilaterally, without wheezing, rhonchi, or rales  Abdomen: soft, non-tender, non-distended; no palpable masses, no hepatosplenomegaly, normoactive bowel sounds, no rebound or guarding  Extremities: no clubbing, cyanosis, or edema  Neuro: alert, oriented x 3, cranial nerves grossly intact, strength 5/5 in upper and lower extremities, sensation intact,   Skin: no rashes or lesions noted      Labs:     Results     Procedure Component Value Units Date/Time    PT/APTT [[id number]]  (Abnormal) Collected:  01/05/19 2213     Updated:  01/05/19 2345     PT 12.6 sec      PT INR 1.0     PTT 22 sec     B-type Natriuretic Peptide [[id number]] Collected:  01/05/19 2213    Specimen:  Blood Updated:  01/05/19 2251     B-Natriuretic Peptide <10 pg/mL     Troponin I [[id number]] Collected:  01/05/19 2213    Specimen:  Blood Updated:  01/05/19 2249     Troponin I <0.01 ng/mL     Comprehensive metabolic panel [[id number]]  (Abnormal) Collected:  01/05/19 2213    Specimen:  Blood Updated:  01/05/19 2243     Glucose 107 mg/dL      BUN 7 mg/dL      Creatinine 0.9 mg/dL      Sodium 629 mEq/L      Potassium 4.2 mEq/L      Chloride 103 mEq/L      CO2 25 mEq/L      Calcium 9.4 mg/dL      Protein, Total 6.7 g/dL      Albumin 3.4 g/dL      AST (SGOT) 18 U/L      ALT 14 U/L      Alkaline Phosphatase 82 U/L      Bilirubin, Total 0.1 mg/dL      Globulin 3.3 g/dL      Albumin/Globulin Ratio 1.0     Anion Gap 11.0    GFR [[id number]] Collected:  01/05/19 2213     Updated:  01/05/19 2243     EGFR >60.0    CBC and differential [[id number]]  (Abnormal) Collected:  01/05/19 2213    Specimen:  Blood Updated:  01/05/19 2223     WBC 6.68 x10 3/uL      Hgb 10.0 g/dL       Hematocrit 64.4 %      Platelets 221 x10 3/uL      RBC 3.82 x10 6/uL      MCV 84.0 fL  MCH 26.2 pg      MCHC 31.2 g/dL      RDW 16 %      MPV 10.4 fL      Neutrophils 54.2 %      Lymphocytes Automated 32.5 %      Monocytes 8.5 %      Eosinophils Automated 3.6 %      Basophils Automated 0.6 %      Immature Granulocyte 0.6 %      Nucleated RBC 0.0 /100 WBC      Neutrophils Absolute 3.62 x10 3/uL      Abs Lymph Automated 2.17 x10 3/uL      Abs Mono Automated 0.57 x10 3/uL      Abs Eos Automated 0.24 x10 3/uL      Absolute Baso Automated 0.04 x10 3/uL      Absolute Immature Granulocyte 0.04 x10 3/uL      Absolute NRBC 0.00 x10 3/uL         EKG shows sinus rhythm with non specific T wave abnormality.  Imaging personally reviewed, including: all available   Xr Chest  Ap Portable    Result Date: 01/06/2019  No acute process. Adaline Sill, MD 01/06/2019 12:22 AM      Safety Checklist  DVT prophylaxis:  CHEST guideline (See page [phone]) Chemical     This note was generated by the Epic EMR system/ Dragon speech recognition and may contain inherent errors or omissions not intended by the user. Grammatical errors, random word insertions, deletions and pronoun errors  are occasional consequences of this technology due to software limitations. Not all errors are caught or corrected. If there are questions or concerns about the content of this note or information contained within the body of this dictation they should be addressed directly with the author for clarification.    Signed by: Alecia Lemming, MD   cc:Malcolm Metro, MD

## 2019-01-06 NOTE — Progress Notes (Addendum)
INTERNAL MEDICINE PROGRESS NOTE  [facility], Division of Hospitalist Medicine  [facility]  [place] pager: [phone]      Addendum-  Received phone call from radiologist regarding CT neck findings. Radiologist recommended id consult and ENT consult.  ID consult placed.  Spoke with ENT (Dr. Harrold Donath who recommended monitoring. Preliminary thinking was that it could be from EJ iv line that was placed yesterday. Follow up on any further recommendations.  Continue antibiotics.  Currently vital signs stable.  Patient states she feels comfortable.  Above plan and ct findings discussed with patient at length.  Discussed with nurse.  Results below.      Ct Head Wo Contrast    Result Date: 01/06/2019   No acute intracerebral abnormality. Note: Note that CT scanning at this site  utilizes multiple dose reduction techniques including automatic exposure control, adjustment of the MAA and/or KVP according to patient's size and use of iterative reconstruction technique Laurena Slimmer, MD 01/06/2019 1:35 PM    Ct Soft Tissue Neck W Contrast    Result Date: 01/06/2019   Extensive subcutaneous emphysema in the right aspect of the neck extending to the right parapharyngeal region. The subcutaneous emphysema extends from just C1/C2 level to the anterior thoracic wall. Nonspecific edema and small amount of fluid in the anterior superior chest wall at the level off the manubriosternal joint more pronounced on the right. This is nonspecific and be related to prior trauma, however extensive infection could have similar appearance. No evidence of drainable abscess. There is mild prominence of the bilateral pontine tonsils without collections. Nonspecific tongue edema. These     findings and/or recommendations were called to   Dr. Bluford Main at time of report signature on    01/06/2019 4:17 PM.  Joselyn Glassman, MD 01/06/2019 4:17 PM    Xr Chest  Ap Portable    Result Date: 01/06/2019  No acute process. Adaline Sill, MD 01/06/2019  12:22 AM        -----------------------------  Date Time: 01/06/19 9:58 AM  Patient Name: [name]  Attending Physician: Delaine Lame, MD    Assessment:   Principal Problem:    Chest pain with high risk of acute coronary syndrome  Active Problems:    Paroxysmal atrial fibrillation    Hypertension    Hyperlipidemia    Seizure disorder    History of gastrointestinal hemorrhage    Depression    Anemia    Asthma  Resolved Problems:    * No resolved hospital problems. *    Today 0   63 yo PMHx HTN, atrial fibrillation (on eliquis), HL, asthma, DM, Depression, Hx of GIB, seizure coming in with chest pain and left jaw pain.    Plan:   1. Chest pain  Troponin negative x3  Cardiology consulted    2. Left jaw pain/swelling  CT to check for abscess/mass, etc  Patient currently on antibiotics- levaquin and flagyl  If CT not showing inflammation/signs of infection, consider tapering down or stopping antibiotics  Analgesics prn    3. Itchiness- benadryl prn  Multiple excoriation marks, no hives seen    4. Hx of atrial fibrillation- patient on eliquis, which was held as outpatient, but now resumed as she states she is going to reschedule her procedure.  Continue metoprolol    5. Hyperlipidemia- continue lipitor    6. Seizure disorder-  Continue keppra and lamictal    7. Psychiatric disorder- continue home meds    Discussed  above plan with patient.  Subjective/24 hour events:     Chief Complaint   Patient presents with   . Chest Pain   Pain on left jaw and left neck, feels it is getting more swollen. She feels she is getting hives on her back and arms, and they are very itchy. She states she had a fever yesterday. Denies any cough, shortness of breath. She states chest pain has resolved  Today : . LOS: 0 days     Safety checklist:   DVT prophylaxis: eliquis  Foley catheter:None   IV access: Peripheral     Medications:   atorvastatin, 40 mg, Daily  chlorhexidine, 10 mL, Q12H SCH  heparin (porcine), 5,000 Units, Q8H  SCH  lamoTRIgine, 100 mg, BID  levETIRAcetam, 500 mg, BID  levoFLOXacin, 500 mg, Q24H  metoprolol succinate XL, 25 mg, Daily  metroNIDAZOLE, 500 mg, Q8H  pantoprazole, 40 mg, QAM AC  QUEtiapine, 50 mg, QHS  ranolazine, 500 mg, BID  rOPINIRole, 0.5 mg, QHS      Current Facility-Administered Medications   Medication Dose Route   . albuterol  1.25 mg Nebulization   . diphenhydrAMINE  6.25 mg Intravenous   . naloxone  0.2 mg Intravenous     Physical exam:   Temp:  [98 F (36.7 C)-100 F (37.8 C)] 98.4 F (36.9 C)  Heart Rate:  [75-97] 84  Resp Rate:  [12-24] 24  BP: (104-138)/(58-77) 119/73  No intake or output data in the 24 hours ending 01/06/19 0958  General: Awake, alert, oriented, no apparent distress.  Eyes: anicteric sclera  HEENT: no pharyngeal erythema, + TTP over left parotid area and neck area.  No tenderness on right side  Cardiovascular: Regular rate and rhythm. No murmurs  Lungs: Clear to auscultation bilaterally. No wheezing.  Abdomen: Soft, non-tender, non-distended. Normal bowel sounds.  Extremities: No edema noted.   Neuro: Non-focal neurological exam.  Skin: + excoriation marks on left arm and upper back  Speech: clear  Psych: cooperative      Labs (last 72 hours):     Recent Labs   Lab 01/06/19  0400 01/05/19  2213   WBC 6.41 6.68   Hgb 9.9* 10.0*   Hematocrit 31.7* 32.1*   Platelets 290 221     Recent Labs   Lab 01/06/19  0400 01/05/19  2213   Sodium 140 139   Potassium 4.2 4.2   Chloride 103 103   CO2 25 25   BUN 6* 7   Creatinine 0.8 0.9   Calcium 9.1 9.4   Albumin 3.4* 3.4*   Protein, Total 6.5 6.7   Bilirubin, Total 0.2 0.1*   Alkaline Phosphatase 80 82   ALT 12 14   AST (SGOT) 16 18   Glucose 99 107*     Recent Labs   Lab 01/05/19  2213   PT 12.6   PT INR 1.0   PTT 22*       Radiology:     Radiology Results (24 Hour)     Procedure Component Value Units Date/Time    XR Chest  AP Portable [[id number]] Collected:  01/06/19 0021    Order Status:  Completed Updated:  01/06/19 0026    Narrative:        Examination: Portable chest.    HISTORY: Pain shortness of breath.    COMPARISON: 08/22/2018.    FINDINGS:  Cardiac device overlie left chest.  Heart size normal.  Atherosclerotic aorta.  Lungs clear.  No pleural  collections.  No acute osseous process.      Impression:         No acute process.    Adaline Sill, MD   01/06/2019 12:22 AM          Signed by: Delaine Lame, MD  Date/time: 01/06/19 9:58 AM

## 2019-01-07 DIAGNOSIS — Z7901 Long term (current) use of anticoagulants: Secondary | ICD-10-CM

## 2019-01-07 DIAGNOSIS — E119 Type 2 diabetes mellitus without complications: Secondary | ICD-10-CM

## 2019-01-07 DIAGNOSIS — J45909 Unspecified asthma, uncomplicated: Secondary | ICD-10-CM

## 2019-01-07 DIAGNOSIS — J982 Interstitial emphysema: Secondary | ICD-10-CM

## 2019-01-07 DIAGNOSIS — R0789 Other chest pain: Secondary | ICD-10-CM

## 2019-01-07 DIAGNOSIS — J438 Other emphysema: Secondary | ICD-10-CM

## 2019-01-07 DIAGNOSIS — E785 Hyperlipidemia, unspecified: Secondary | ICD-10-CM

## 2019-01-07 LAB — CBC
Absolute NRBC: 0 10*3/uL (ref 0.00–0.00)
Hematocrit: 29.6 % — ABNORMAL LOW (ref 34.7–43.7)
Hgb: 9.4 g/dL — ABNORMAL LOW (ref 11.4–14.8)
MCH: 26.1 pg (ref 25.1–33.5)
MCHC: 31.8 g/dL (ref 31.5–35.8)
MCV: 82.2 fL (ref 78.0–96.0)
MPV: 10.6 fL (ref 8.9–12.5)
Nucleated RBC: 0 /100 WBC (ref 0.0–0.0)
Platelets: 233 10*3/uL (ref 142–346)
RBC: 3.6 10*6/uL — ABNORMAL LOW (ref 3.90–5.10)
RDW: 16 % — ABNORMAL HIGH (ref 11–15)
WBC: 5.63 10*3/uL (ref 3.10–9.50)

## 2019-01-07 MED ORDER — DIPHENHYDRAMINE HCL 50 MG/ML IJ SOLN
6.25 mg | INTRAMUSCULAR | Status: DC | PRN
Start: 2019-01-07 — End: 2019-01-09
  Administered 2019-01-07 – 2019-01-08 (×7): 6.25 mg via INTRAVENOUS
  Filled 2019-01-07 (×7): qty 1

## 2019-01-07 MED ORDER — MORPHINE SULFATE 2 MG/ML IJ/IV SOLN (WRAP)
2.0000 mg | Status: DC | PRN
Start: 2019-01-07 — End: 2019-01-09
  Administered 2019-01-07 – 2019-01-09 (×13): 2 mg via INTRAVENOUS
  Filled 2019-01-07 (×13): qty 1

## 2019-01-07 NOTE — Progress Notes (Signed)
PCP NOTIFICATION  Patient was asked if patient wanted to have his/her PCP notified. Patient stated yes. CM called PCP and notified the physician that the patient is now hospitalized.

## 2019-01-07 NOTE — Progress Notes (Signed)
CARDIOLOGY HOSPITAL NOTE  Date:  01/07/2019  1:11 PM    Delany Steury Alyson Locket, MD [facility] Cardiology  Office phone:  [phone]     [facility] phone [phone], [facility] phone [phone]    Patient:  [name].  DOB: 07-06-1956.  63 y.o.  female  Date of Admission:  01/05/2019  Attending:  Mahlon Gammon, Majid,*     REASON FOR CONSULTATION:   Patient was seen in consultation at the request of Dr. Bluford Main     ASSESSMENT & PLAN:   63 y.o. 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 Aneta Mins) which documented a negative nuclear stress test on 06/20/2018.  Cardiac catheterization in 2014 and in 2017 were negative for CAD.  ECHO 07/09/2018 showed normal EF and moderate AR.  The patient is being treated for microvascular angina.    DISCUSSION:  Patient is stable standpoint.  Recommend continue with current cardiac medications.  Follow-up with her cardiologist Dr. Aneta Mins after discharge.  We will see the patient as needed.    HISTORY OF PRESENT ILLNESS / SUBJECTIVE:   Patient denies chest pain today.  No shortness of breath    REVIEW OF SYSTEMS:                                                  In addition to systems noted in HPI:   General:   no fatigue.   Cardiac:   positive CP.   no palpitations.   Pulmonary:   no SOB.   Neuro:   no syncope.   Heme:   no bleeding.   All other systems negative.       DIAGNOSTICS:                                                 ECG:  05-Jan-2019 21:21:24 Normal sinus rhythm.   Normal QRS.   No ischemic ST changes.           PAST MEDICAL HISTORY:   hypertension, pAFib on eliquis, hyperlipidemia, asthma, type 2 diabetes, HTN, TIA, and seizure disorder    SOCIAL HISTORY:   Never smoker    FAMILY HISTORY:     No CAD    PHYSICAL EXAM:                                                                             BP 105/67   Pulse 84   Temp 98.4 F (36.9 C) (Oral)   Resp 14   Ht 1.651 m (5\' 5" )   Wt 98.2 kg (216 lb 6.4 oz)   LMP  (LMP Unknown)   SpO2 99%   BMI 36.01 kg/m      Intake/Output Summary (  Last 24 hours) at 01/07/2019 1311  Last data filed at 01/07/2019 0600  Gross per 24 hour   Intake 757 ml   Output --   Net 757 ml     >> General:   no distress,  >> Respiratory:   unlabored respiration,  no crackles,  >> Cardiovascular:   regular rhythm,  No LE edema,  >> Skin:   warm and dry,  >> Neuro/Psych:   Alert,  Appropriate mood and affect,      LABORATORY:     CBC w/Diff   Recent Labs   Lab 01/07/19  0504 01/06/19  0400 01/05/19  2213   WBC 5.63 6.41 6.68   Hgb 9.4* 9.9* 10.0*   Hematocrit 29.6* 31.7* 32.1*   Platelets 233 290 221        Basic Metabolic Profile   Recent Labs   Lab 01/06/19  0400 01/05/19  2213   Sodium 140 139   Potassium 4.2 4.2   Chloride 103 103   CO2 25 25   BUN 6* 7   Creatinine 0.8 0.9   EGFR >60.0 >60.0   Glucose 99 107*   Calcium 9.1 9.4        Cardiac Enzymes   Recent Labs   Lab 01/06/19  0608 01/06/19  0400 01/05/19  2213   Troponin I <0.01 <0.01 <0.01     Recent Labs   Lab 01/05/19  2213   B-Natriuretic Peptide <10        Coagulation Studies   Recent Labs   Lab 01/05/19  2213   PT 12.6   PT INR 1.0   PTT 22*        ALLERGIES:     Allergies   Allergen Reactions   . Singulair [Montelukast] Itching   . Latex    . Nitroglycerin    . Penicillins    . Tetracyclines & Related         MEDICATIONS:    INFUSION MEDS:     . sodium chloride 75 mL/hr at 01/07/19 0059      SCHEDULED MEDS:     Current Facility-Administered Medications   Medication Dose Route Frequency   . amLODIPine  2.5 mg Oral Daily   . apixaban  5 mg Oral Q12H SCH   . atorvastatin  40 mg Oral Daily   . chlorhexidine  10 mL Mouth/Throat Q12H SCH   . clindamycin  300 mg Intravenous Q8H   . lamoTRIgine  100 mg Oral BID   . levETIRAcetam  500 mg Oral BID   . metoprolol succinate XL   25 mg Oral Daily   . pantoprazole  40 mg Oral QAM AC   . QUEtiapine  50 mg Oral QHS   . ranolazine  500 mg Oral BID   . rOPINIRole  0.5 mg Oral QHS      PRN MEDS:   albuterol, diphenhydrAMINE, morphine, naloxone, ondansetron, oxyCODONE-acetaminophen    MISCELLANEOUS:

## 2019-01-07 NOTE — Progress Notes (Signed)
MEDICINE PROGRESS NOTE  [facility], DIVISION OF HOSPITALIST MEDICINE   [facility]   [place] Pager: [phone]      Date Time: 01/07/19 1:11 PM  Patient Name: [name]  Attending Physician: Mahlon Gammon, [facility] Day: 3  Assessment:     Active Hospital Problems    Diagnosis   . Non-traumatic subcutaneous emphysema   . Chest pain with high risk of acute coronary syndrome   . Paroxysmal atrial fibrillation   . Hypertension   . Hyperlipidemia   . Seizure disorder   . History of gastrointestinal hemorrhage   . Depression   . Anemia   . Asthma    63 y.o. female with past medical history of Hypertension,Paroxysmal atrial fibrillation on eliquis,Hyperlipidemia, asthma, type 2 diabetes, Depression,  H/o GIB, and seizure disorder presenting with a compliant of chest pain.Er neck pain and swelling   Ct neck soft tissue  Revealed Extensive subcutaneous emphysema in the right aspect of the neck extending to the right parapharyngeal region    Today she still has pain and swelling Rt neck area, no chest pin, no cough, no fever   Plan:   Chest pain  - Atypical   - has  negative nuclear stress test on 06/20/2018  - ECHO 07/09/2018 showed normal EF and moderate AR.  - Cardiac catheterization in 2014 and in 2017 were negative for CAD  -  Normal trop ,EKG noted  - Cardiology note and recommendation appreciated  - Continue Norvasc ,metoprolol ,Eliquis , ranolazine ,Lipitor  - Continue analgesics    Subcutaneous  Rt neck emphysema  -Likely due EJ line placement   -Follow-up with ENT and ID consultation  -cont' with antibiotics, follow-up with the cultures  -Analgesics with morphine    Hx of atrial fibrillation-  Continue metoprolol, Eliquis      Hyperlipidemia-   -continue lipitor    H/O  Seizure disorder-  Continue keppra and lamictal    DM  -  Diet cotrol in home   - SSI,FS monitoring    H/O Gi bleeding  - Gi prophylaxy  - f/u CBC    H/O asthma  - stable , no wheezing , f/u pulse ox            Case discussed with:Pt, staff      Safety Checklist:     DVT prophylaxis:  CHEST guideline (See page [id number]) Chemical   Foley:   Rn Foley protocol Not present   IVs:  None   PT/OT: Not needed     Lines:     Patient Lines/Drains/Airways Status    Active PICC Line / CVC Line / PIV Line / Drain / Airway / Intraosseous Line / Epidural Line / ART Line / Line / Wound / Pressure Ulcer / NG/OG Tube     Name:   Placement date:   Placement time:   Site:   Days:    Peripheral IV 01/06/19 Anterior;Left Upper Arm   01/06/19    1154    Upper Arm   1                 Disposition:     Today's date: 01/07/2019  Length of Stay: 0  Anticipated medical stability for discharge: per medical improvement  Reason for ongoing hospitalization: Chest pain, subcutaneous emphysema    Subjective     CC: Chest pain with high risk of acute coronary syndrome    Supervise For Meals Frequency: All meals  Diet Consistent Carbohydrate and Heart Healthy       Review of Systems:   Review of Systems - Negative except as above in HPI    Physical Exam:     Temp:  [97.9 F (36.6 C)-98.4 F (36.9 C)] 98.4 F (36.9 C)  Heart Rate:  [72-86] 84  Resp Rate:  [14-22] 14  BP: (94-113)/(60-73) 105/67  Intake and Output Summary-last 24 Hrs:  I/O last 3 completed shifts:  In: 757 [P.O.:757]  Out: -      General: awake, alert ,in no acute distress  Cardiovascular: regular rate and rhythm, no murmurs  Lungs: clear to auscultation bilaterally, no additional sounds  Abdomen: soft, non-tender, non-distended; normoactive bowel sounds  Extremities: no edema  Neurological: Alert and oriented X 3, moves all extremities.   Skin: s/p Rt neck subcutaneous emphysema         Meds:   Medications were reviewed:  Scheduled Meds:  Current Facility-Administered Medications   Medication Dose Route Frequency   . amLODIPine  2.5 mg Oral Daily   . apixaban  5 mg Oral Q12H SCH   . atorvastatin  40 mg Oral Daily   . chlorhexidine  10 mL Mouth/Throat Q12H SCH   . clindamycin   300 mg Intravenous Q8H   . lamoTRIgine  100 mg Oral BID   . levETIRAcetam  500 mg Oral BID   . metoprolol succinate XL  25 mg Oral Daily   . pantoprazole  40 mg Oral QAM AC   . QUEtiapine  50 mg Oral QHS   . ranolazine  500 mg Oral BID   . rOPINIRole  0.5 mg Oral QHS     Continuous Infusions:  . sodium chloride 75 mL/hr at 01/07/19 0059     PRN Meds:.albuterol, diphenhydrAMINE, morphine, naloxone, ondansetron, oxyCODONE-acetaminophen  Labs/Radiology:   Imaging personally reviewed, including: all available   Ct Head Wo Contrast    Result Date: 01/06/2019   No acute intracerebral abnormality. Note: Note that CT scanning at this site  utilizes multiple dose reduction techniques including automatic exposure control, adjustment of the MAA and/or KVP according to patient's size and use of iterative reconstruction technique Laurena Slimmer, MD 01/06/2019 1:35 PM    Ct Soft Tissue Neck W Contrast    Result Date: 01/06/2019   Extensive subcutaneous emphysema in the right aspect of the neck extending to the right parapharyngeal region. The subcutaneous emphysema extends from just C1/C2 level to the anterior thoracic wall. Nonspecific edema and small amount of fluid in the anterior superior chest wall at the level off the manubriosternal joint more pronounced on the right. This is nonspecific and be related to prior trauma, however extensive infection could have similar appearance. No evidence of drainable abscess. There is mild prominence of the bilateral pontine tonsils without collections. Nonspecific tongue edema. These     findings and/or recommendations were called to   Dr. Bluford Main at time of report signature on    01/06/2019 4:17 PM.  Joselyn Glassman, MD 01/06/2019 4:17 PM    No results for input(s): GLUCOSEWB in the last 24 hours.  Recent Labs   Lab 01/06/19  0400 01/05/19  2213   Sodium 140 139   Potassium 4.2 4.2   Chloride 103 103   BUN 6* 7   Creatinine 0.8 0.9   EGFR >60.0 >60.0   Glucose 99 107*   Calcium 9.1 9.4     Recent  Labs   Lab 01/07/19  0504 01/06/19  0400 01/05/19  2213   WBC 5.63 6.41 6.68   Hgb 9.4* 9.9* 10.0*   Hematocrit 29.6* 31.7* 32.1*   Platelets 233 290 221     Recent Labs   Lab 01/05/19  2213   PT 12.6   PT INR 1.0   PTT 22*     Recent Labs   Lab 01/06/19  0400 01/05/19  2213   Alkaline Phosphatase 80 82   Bilirubin, Total 0.2 0.1*   ALT 12 14   AST (SGOT) 16 18         Signed by: Yates Decamp, MD

## 2019-01-07 NOTE — Plan of Care (Signed)
Problem: Safety  Goal: Patient will be free from injury during hospitalization  Outcome: Progressing  Flowsheets (Taken 01/07/2019 0913)  Patient will be free from injury during hospitalization : Hourly rounding; Assess patient's risk for falls and implement fall prevention plan of care per policy; Provide and maintain safe environment; Use appropriate transfer methods; Ensure appropriate safety devices are available at the bedside; Assess for patients risk for elopement and implement Elopement Risk Plan per policy; Include patient/ family/ care giver in decisions related to safety     Problem: Pain  Goal: Pain at adequate level as identified by patient  Outcome: Progressing  Flowsheets (Taken 01/07/2019 0913)  Pain at adequate level as identified by patient: Identify patient comfort function goal; Assess for risk of opioid induced respiratory depression, including snoring/sleep apnea. Alert healthcare team of risk factors identified.; Assess pain on admission, during daily assessment and/or before any "as needed" intervention(s); Reassess pain within 30-60 minutes of any procedure/intervention, per Pain Assessment, Intervention, Reassessment (AIR) Cycle; Evaluate if patient comfort function goal is met; Evaluate patient's satisfaction with pain management progress; Offer non-pharmacological pain management interventions     Problem: Side Effects from Pain Analgesia  Goal: Patient will experience minimal side effects of analgesic therapy  Outcome: Progressing  Flowsheets (Taken 01/07/2019 0913)  Patient will experience minimal side effects of analgesic therapy: Monitor/assess patient's respiratory status (RR depth, effort, breath sounds); Assess for changes in cognitive function; Prevent/manage side effects per LIP orders (i.e. nausea, vomiting, pruritus, constipation, urinary retention, etc.); Evaluate for opioid-induced sedation with appropriate assessment tool (i.e. POSS)     Problem: Psychosocial and Spiritual  Needs  Goal: Demonstrates ability to cope with hospitalization/illness  Outcome: Progressing  Flowsheets (Taken 01/07/2019 0913)  Demonstrates ability to cope with hospitalizations/illness: Provide quiet environment; Encourage verbalization of feelings/concerns/expectations; Assist patient to identify own strengths and abilities; Include patient/ patient care companion in decisions; Reinforce positive adaptation of new coping behaviors; Encourage patient to set small goals for self; Encourage participation in diversional activity     Problem: Compromised Hemodynamic Status  Goal: Vital signs and fluid balance maintained/improved  Outcome: Progressing  Flowsheets (Taken 01/07/2019 0913)  Vital signs and fluid balance are maintained/improved: Position patient for maximum circulation/cardiac output; Monitor/assess vitals and hemodynamic parameters with position changes; Monitor/assess lab values and report abnormal values; Monitor and compare daily weight     Problem: Inadequate Gas Exchange  Goal: Adequate oxygenation and improved ventilation  Outcome: Progressing  Flowsheets (Taken 01/07/2019 0913)  Adequate oxygenation and improved ventilation: Assess lung sounds; Position for maximum ventilatory efficiency; Teach/reinforce use of incentive spirometer 10 times per hour while awake, cough and deep breath as needed; Increase activity as tolerated/progressive mobility; Plan activities to conserve energy: plan rest periods; Provide mechanical and oxygen support to facilitate gas exchange; Consult/collaborate with Respiratory Therapy     Problem: Hemodynamic Status: Cardiac  Goal: Stable vital signs and fluid balance  Outcome: Progressing  Flowsheets (Taken 01/07/2019 0913)  Stable vital signs and fluid balance: Monitor/assess vital signs and telemetry per unit protocol; Monitor lab values; Weigh on admission and record weight daily; Monitor intake/output per unit protocol and/or LIP order; Assess signs and symptoms  associated with cardiac rhythm changes; Monitor for leg swelling/edema and report to LIP if abnormal    Patient and family agrees to the care plan and goals.

## 2019-01-07 NOTE — Progress Notes (Signed)
Patient alert and oriented x 4 at this time. Complains of R sided chest pain. Pain medications requested and given as ordered. Safety needs discussed and addressed.  Extensive teaching on  Medications, and pain medications completed. Patient understood and all her questions were answered

## 2019-01-07 NOTE — Progress Notes (Signed)
01/07/19. "63 yo PMHx HTN, atrial fibrillation (on eliquis), HL, asthma, DM, Depression, Hx of GIB, seizure coming in with chest pain and left jaw pain..." DCP: home with no additional needs.

## 2019-01-07 NOTE — Plan of Care (Signed)
Problem: Moderate/High Fall Risk Score >5  Goal: Patient will remain free of falls  Outcome: Progressing  Flowsheets (Taken 01/07/2019 0332)  Moderate Risk (6-13): MOD-Remain with patient during toileting  High (Greater than 13): HIGH-Bed alarm on at all times while patient in bed     Problem: Safety  Goal: Patient will be free from injury during hospitalization  Outcome: Progressing  Flowsheets (Taken 01/07/2019 0332)  Patient will be free from injury during hospitalization : Provide and maintain safe environment;Hourly rounding;Ensure appropriate safety devices are available at the bedside  Note:   Patient alert/oriented x 4; able to verbalize needs. Moves all extremities; reported slight weakness on left lower extremity. Bed exit alarm on while in bed. Hourly rounding in progress.   Goal: Patient will be free from infection during hospitalization  Outcome: Progressing  Flowsheets (Taken 01/07/2019 0332)  Free from Infection during hospitalization: Assess and monitor for signs and symptoms of infection;Monitor lab/diagnostic results;Encourage patient and family to use good hand hygiene technique  Note:   Patient afebrile. Antibiotic therapy in progress.      Problem: Pain  Goal: Pain at adequate level as identified by patient  Outcome: Progressing  Flowsheets (Taken 01/07/2019 0332)  Pain at adequate level as identified by patient: Identify patient comfort function goal;Evaluate patient's satisfaction with pain management progress;Reassess pain within 30-60 minutes of any procedure/intervention, per Pain Assessment, Intervention, Reassessment (AIR) Cycle  Note:   Patient reported constant soreness on mid and left neck area. Pain managed with PRN Percocet PO as well as Morphine IV; no new complaints at this time.      Problem: Side Effects from Pain Analgesia  Goal: Patient will experience minimal side effects of analgesic therapy  Outcome: Progressing  Flowsheets (Taken 01/07/2019 0332)  Patient will experience  minimal side effects of analgesic therapy: Monitor/assess patient's respiratory status (RR depth, effort, breath sounds);Prevent/manage side effects per LIP orders (i.e. nausea, vomiting, pruritus, constipation, urinary retention, etc.)     Problem: Discharge Barriers  Goal: Patient will be discharged home or other facility with appropriate resources  Outcome: Progressing  Flowsheets (Taken 01/07/2019 0332)  Discharge to home or other facility with appropriate resources: Provide appropriate patient education     Problem: Psychosocial and Spiritual Needs  Goal: Demonstrates ability to cope with hospitalization/illness  Outcome: Progressing  Flowsheets (Taken 01/07/2019 0332)  Demonstrates ability to cope with hospitalizations/illness: Assist patient to identify own strengths and abilities;Provide quiet environment;Encourage verbalization of feelings/concerns/expectations     Problem: Compromised Hemodynamic Status  Goal: Vital signs and fluid balance maintained/improved  Outcome: Progressing  Flowsheets (Taken 01/07/2019 0332)  Vital signs and fluid balance are maintained/improved: Monitor/assess vitals and hemodynamic parameters with position changes  Note:   No reports of shortness of breath or chest pain.      Problem: Hemodynamic Status: Cardiac  Goal: Stable vital signs and fluid balance  Outcome: Progressing  Note:   NSR on telemetry monitor. Denied dizziness. IVF infusing as ordered.

## 2019-01-08 LAB — CBC
Absolute NRBC: 0 10*3/uL (ref 0.00–0.00)
Hematocrit: 30.8 % — ABNORMAL LOW (ref 34.7–43.7)
Hgb: 9.5 g/dL — ABNORMAL LOW (ref 11.4–14.8)
MCH: 26.6 pg (ref 25.1–33.5)
MCHC: 30.8 g/dL — ABNORMAL LOW (ref 31.5–35.8)
MCV: 86.3 fL (ref 78.0–96.0)
MPV: 10.4 fL (ref 8.9–12.5)
Nucleated RBC: 0 /100 WBC (ref 0.0–0.0)
Platelets: 207 10*3/uL (ref 142–346)
RBC: 3.57 10*6/uL — ABNORMAL LOW (ref 3.90–5.10)
RDW: 16 % — ABNORMAL HIGH (ref 11–15)
WBC: 4.37 10*3/uL (ref 3.10–9.50)

## 2019-01-08 LAB — ECG 12-LEAD
Atrial Rate: 94 {beats}/min
P Axis: 29 degrees
P-R Interval: 154 ms
Q-T Interval: 378 ms
QRS Duration: 84 ms
QTC Calculation (Bezet): 472 ms
R Axis: 31 degrees
T Axis: 12 degrees
Ventricular Rate: 94 {beats}/min

## 2019-01-08 LAB — BASIC METABOLIC PANEL
Anion Gap: 8 (ref 5.0–15.0)
BUN: 7 mg/dL (ref 7–19)
CO2: 22 mEq/L (ref 22–29)
Calcium: 8.8 mg/dL (ref 8.5–10.5)
Chloride: 108 mEq/L (ref 100–111)
Creatinine: 0.8 mg/dL (ref 0.6–1.0)
Glucose: 114 mg/dL — ABNORMAL HIGH (ref 70–100)
Potassium: 5.6 mEq/L — ABNORMAL HIGH (ref 3.5–5.1)
Sodium: 138 mEq/L (ref 136–145)

## 2019-01-08 LAB — GFR: EGFR: >60

## 2019-01-08 LAB — TSH: TSH: 0.32 u[IU]/mL — ABNORMAL LOW (ref 0.35–4.94)

## 2019-01-08 LAB — B-TYPE NATRIURETIC PEPTIDE: B-Natriuretic Peptide: 33 pg/mL (ref 0–100)

## 2019-01-08 MED ORDER — ONDANSETRON 4 MG PO TBDP
4.00 mg | ORAL_TABLET | Freq: Four times a day (QID) | ORAL | Status: DC | PRN
Start: 2019-01-08 — End: 2019-01-09

## 2019-01-08 MED ORDER — ACETAMINOPHEN 325 MG PO TABS
650.0000 mg | ORAL_TABLET | ORAL | Status: DC | PRN
Start: 2019-01-08 — End: 2019-01-09

## 2019-01-08 MED ORDER — ONDANSETRON HCL 4 MG/2ML IJ SOLN
4.00 mg | Freq: Four times a day (QID) | INTRAMUSCULAR | Status: DC | PRN
Start: 2019-01-08 — End: 2019-01-09
  Administered 2019-01-09: 4 mg via INTRAVENOUS
  Filled 2019-01-08: qty 2

## 2019-01-08 MED ORDER — ACETAMINOPHEN 650 MG RE SUPP
650.00 mg | RECTAL | Status: DC | PRN
Start: 2019-01-08 — End: 2019-01-09

## 2019-01-08 MED ORDER — NALOXONE HCL 0.4 MG/ML IJ SOLN (WRAP)
0.20 mg | INTRAMUSCULAR | Status: DC | PRN
Start: 2019-01-08 — End: 2019-01-09

## 2019-01-08 NOTE — UM Notes (Addendum)
Admit to Inpatient (Order [id number])   Admission   Date: 01/08/2019 Department: [facility] Ordering/Authorizing: Mahlon Gammon, Belva Crome, MD   Subcutaneous  Rt neck emphysema  -Likely due EJ line placement   -Follow-up with ENT and ID consultation  -cont' with antibiotics->clindamycin 300 mg iv q 8 hr,   follow-up with the cultures  -Analgesics with morphine 2 mg q 4 hr prn  ENT CONSULT     Vital Sign Min/Max (last 24 hours)     Value Min Max   Temp 97.7 F (36.5 C) 98.4 F (36.9 C)   Heart Rate 67 84   Resp Rate 14 17   BP: Systolic 94 105   BP: Diastolic 61 67   SpO2 99 % 100 %           CBC without differential [[id number]] (Abnormal) Collected: 01/08/19 0504   Specimen: Blood Updated: 01/08/19 0549    WBC 4.37 x10 3/uL     Hgb 9.5Low  g/dL     Hematocrit 78.2NFA  %     Platelets 207 x10 3/uL     RBC 3.57Low  x10 6/uL     MCV 86.3 fL     MCH 26.6 pg     MCHC 30.8Low  g/dL     RDW 21HYQM  %     MPV 10.4 fL     Nucleated RBC 0.0 /100 WBC     Absolute NRBC 0.00 x10 3/uL

## 2019-01-08 NOTE — Plan of Care (Signed)
Patient stable, no s/s of respiratory distress, pain management in progress with morphine q4H and Percocet. Benadryl for itching increased to q4h given with morphine. IV abt in progress with no adverse reaction. ENT and ID consult , fall precaution in place, call light within reach, bed alarm on, non skid socks on.   Problem: Pain  Goal: Pain at adequate level as identified by patient  Outcome: Progressing  Flowsheets (Taken 01/07/2019 0913 by Georgia Lopes, RN)  Pain at adequate level as identified by patient: Identify patient comfort function goal;Assess for risk of opioid induced respiratory depression, including snoring/sleep apnea. Alert healthcare team of risk factors identified.;Assess pain on admission, during daily assessment and/or before any "as needed" intervention(s);Reassess pain within 30-60 minutes of any procedure/intervention, per Pain Assessment, Intervention, Reassessment (AIR) Cycle;Evaluate if patient comfort function goal is met;Evaluate patient's satisfaction with pain management progress;Offer non-pharmacological pain management interventions     Problem: Side Effects from Pain Analgesia  Goal: Patient will experience minimal side effects of analgesic therapy  Outcome: Progressing  Flowsheets (Taken 01/07/2019 0913 by Georgia Lopes, RN)  Patient will experience minimal side effects of analgesic therapy: Monitor/assess patient's respiratory status (RR depth, effort, breath sounds);Assess for changes in cognitive function;Prevent/manage side effects per LIP orders (i.e. nausea, vomiting, pruritus, constipation, urinary retention, etc.);Evaluate for opioid-induced sedation with appropriate assessment tool (i.e. POSS)     Problem: Compromised Hemodynamic Status  Goal: Vital signs and fluid balance maintained/improved  Outcome: Progressing  Flowsheets (Taken 01/07/2019 0913 by Georgia Lopes, RN)  Vital signs and fluid balance are maintained/improved: Position patient for maximum  circulation/cardiac output;Monitor/assess vitals and hemodynamic parameters with position changes;Monitor/assess lab values and report abnormal values;Monitor and compare daily weight     Problem: Inadequate Gas Exchange  Goal: Adequate oxygenation and improved ventilation  Outcome: Progressing  Flowsheets (Taken 01/07/2019 0913 by Georgia Lopes, RN)  Adequate oxygenation and improved ventilation: Assess lung sounds;Position for maximum ventilatory efficiency;Teach/reinforce use of incentive spirometer 10 times per hour while awake, cough and deep breath as needed;Increase activity as tolerated/progressive mobility;Plan activities to conserve energy: plan rest periods;Provide mechanical and oxygen support to facilitate gas exchange;Consult/collaborate with Respiratory Therapy     Problem: Pain interferes with ability to perform ADL  Goal: Pain at adequate level as identified by patient  Outcome: Progressing  Flowsheets (Taken 01/07/2019 0913 by Georgia Lopes, RN)  Pain at adequate level as identified by patient: Identify patient comfort function goal;Assess for risk of opioid induced respiratory depression, including snoring/sleep apnea. Alert healthcare team of risk factors identified.;Assess pain on admission, during daily assessment and/or before any "as needed" intervention(s);Reassess pain within 30-60 minutes of any procedure/intervention, per Pain Assessment, Intervention, Reassessment (AIR) Cycle;Evaluate if patient comfort function goal is met;Evaluate patient's satisfaction with pain management progress;Offer non-pharmacological pain management interventions

## 2019-01-08 NOTE — Progress Notes (Signed)
MEDICINE PROGRESS NOTE  [facility], DIVISION OF HOSPITALIST MEDICINE   [facility]   [place] Pager: [phone]      Date Time: 01/08/19 2:08 PM  Patient Name: [name]  Attending Physician: Mahlon Gammon, [facility] Day: 4  Assessment:     Active Hospital Problems    Diagnosis   . Non-traumatic subcutaneous emphysema   . Chest pain with high risk of acute coronary syndrome   . Paroxysmal atrial fibrillation   . Hypertension   . Hyperlipidemia   . Seizure disorder   . History of gastrointestinal hemorrhage   . Depression   . Anemia   . Asthma    63 y.o. female with past medical history of Hypertension,Paroxysmal atrial fibrillation on eliquis,Hyperlipidemia, asthma, type 2 diabetes, Depression,  H/o GIB, and seizure disorder presenting with a compliant of chest pain.Er neck pain and swelling   Ct neck soft tissue  Revealed Extensive subcutaneous emphysema in the right aspect of the neck extending to the right parapharyngeal region    Today she  Feels better still, pain and swelling Rt neck area decreasing , no chest pin, no SOB, no  cough, no fever   Plan:   Chest pain  - Atypical   - has  negative nuclear stress test on 06/20/2018  - ECHO 07/09/2018 showed normal EF and moderate AR.  - Cardiac catheterization in 2014 and in 2017 were negative for CAD  -  Normal trop ,EKG noted  - Cardiology note and recommendation appreciated  - Continue Norvasc ,metoprolol ,Eliquis , ranolazine ,Lipitor  - Continue analgesics    Subcutaneous  Rt neck emphysema  -Likely due EJ line placement   -Follow-up with ENT, for endoscopy tomorrow   -cont' with antibiotics, follow-up with the cultures  -Analgesics with morphine    Hx of atrial fibrillation-  Continue metoprolol, Eliquis      Hyperlipidemia-   -continue lipitor    H/O  Seizure disorder-  Continue keppra and lamictal    DM  -  Diet cotrol in home   - SSI,FS monitoring    H/O Gi bleeding  - Gi prophylaxy  - f/u CBC    H/O asthma  - stable ,  no wheezing , f/u pulse ox     Case discussed with:Pt, staff      Safety Checklist:     DVT prophylaxis:  CHEST guideline (See page [id number]) Chemical   Foley:  [place] Rn Foley protocol Not present   IVs:  None   PT/OT: Not needed     Lines:     Patient Lines/Drains/Airways Status    Active PICC Line / CVC Line / PIV Line / Drain / Airway / Intraosseous Line / Epidural Line / ART Line / Line / Wound / Pressure Ulcer / NG/OG Tube     Name:   Placement date:   Placement time:   Site:   Days:    Peripheral IV 01/06/19 Anterior;Left Upper Arm   01/06/19    1154    Upper Arm   1                 Disposition:     Today's date: 01/08/2019  Length of Stay: 0  Anticipated medical stability for discharge: per medical improvement  Reason for ongoing hospitalization: Chest pain, subcutaneous emphysema    Subjective     CC: Chest pain with high risk of acute coronary syndrome    Supervise For Meals Frequency: All  meals  Diet Consistent Carbohydrate and Heart Healthy       Review of Systems:   Review of Systems - Negative except as above in HPI    Physical Exam:     Temp:  [97.7 F (36.5 C)-98.4 F (36.9 C)] 98.1 F (36.7 C)  Heart Rate:  [67-78] 75  Resp Rate:  [16-17] 17  BP: (94-117)/(61-71) 117/71  Intake and Output Summary-last 24 Hrs:  I/O last 3 completed shifts:  In: 500 [P.O.:500]  Out: -      General: awake, alert ,in no acute distress  Cardiovascular: regular rate and rhythm, no murmurs  Lungs: clear to auscultation bilaterally, no additional sounds  Abdomen: soft, non-tender, non-distended; normoactive bowel sounds  Extremities: no edema  Neurological: Alert and oriented X 3, moves all extremities.   Skin: s/p Rt neck subcutaneous emphysema         Meds:   Medications were reviewed:  Scheduled Meds:  Current Facility-Administered Medications   Medication Dose Route Frequency   . amLODIPine  2.5 mg Oral Daily   . apixaban  5 mg Oral Q12H SCH   . atorvastatin  40 mg Oral Daily   . chlorhexidine  10 mL Mouth/Throat Q12H  SCH   . clindamycin  300 mg Intravenous Q8H   . lamoTRIgine  100 mg Oral BID   . levETIRAcetam  500 mg Oral BID   . metoprolol succinate XL  25 mg Oral Daily   . pantoprazole  40 mg Oral QAM AC   . QUEtiapine  50 mg Oral QHS   . ranolazine  500 mg Oral BID   . rOPINIRole  0.5 mg Oral QHS     Continuous Infusions:  . sodium chloride 75 mL/hr at 01/08/19 0234     PRN Meds:.albuterol, diphenhydrAMINE, morphine, naloxone, ondansetron, oxyCODONE-acetaminophen  Labs/Radiology:   Imaging personally reviewed, including: all available   No results found.  No results for input(s): GLUCOSEWB in the last 24 hours.  Recent Labs   Lab 01/08/19  1123 01/06/19  0400 01/05/19  2213   Sodium 138 140 139   Potassium 5.6* 4.2 4.2   Chloride 108 103 103   BUN 7 6* 7   Creatinine 0.8 0.8 0.9   EGFR >60.0 >60.0 >60.0   Glucose 114* 99 107*   Calcium 8.8 9.1 9.4     Recent Labs   Lab 01/08/19  0504 01/07/19  0504 01/06/19  0400 01/05/19  2213   WBC 4.37 5.63 6.41 6.68   Hgb 9.5* 9.4* 9.9* 10.0*   Hematocrit 30.8* 29.6* 31.7* 32.1*   Platelets 207 233 290 221     Recent Labs   Lab 01/05/19  2213   PT 12.6   PT INR 1.0   PTT 22*     Recent Labs   Lab 01/06/19  0400 01/05/19  2213   Alkaline Phosphatase 80 82   Bilirubin, Total 0.2 0.1*   ALT 12 14   AST (SGOT) 16 18         Signed by: Yates Decamp, MD

## 2019-01-08 NOTE — Plan of Care (Signed)
For possible d/c today or tomorrow

## 2019-01-08 NOTE — Consults (Signed)
Consult Note  [facility]  [phone]      Date Time: 01/08/19 5:14 PM  Patient Name: [name]  Attending Physician: Mahlon Gammon, Belva Crome,*    Assessment:   Right neck pain  SubQ air likely from traumatic attempt at EJ placement  No abscess or drainable collection on CT neck    Plan:   Continue antibiotics   Cold compresses on neck, pain control  Will plan for fiberoptic scope exam tomorrow     History of Present Illness:   [name] is a 63 y.o. female with past medical history of Hypertension,Paroxysmal atrial fibrillation on eliquis,Hyperlipidemia, asthma, type 2 diabetes, Depression,  H/o GIB, and seizure disorder presented with a compliant of chest pain.  ENT consulted to further evaluate right-sided neck pain following traumatic attempt at placement of EJ catheter.    Past Medical History:     Past Medical History:   Diagnosis Date   . Asthma    . Convulsions    . Diabetes mellitus     diet controlled    . Hyperlipidemia    . Hypertension    . TIA (transient ischemic attack)        Past Surgical History:     Past Surgical History:   Procedure Laterality Date   . HYSTERECTOMY     . pci         Family History:   No family history on file.    Social History:     Social History     Socioeconomic History   . Marital status: Divorced     Spouse name: Not on file   . Number of children: Not on file   . Years of education: Not on file   . Highest education level: Not on file   Occupational History   . Not on file   Social Needs   . Financial resource strain: Not on file   . Food insecurity:     Worry: Not on file     Inability: Not on file   . Transportation needs:     Medical: Not on file     Non-medical: Not on file   Tobacco Use   . Smoking status: Never Smoker   . Smokeless tobacco: Never Used   Substance and Sexual Activity   . Alcohol use: Never     Frequency: Never   . Drug use: Never   . Sexual activity: Not on file   Lifestyle   . Physical activity:     Days per week: Not on  file     Minutes per session: Not on file   . Stress: Not on file   Relationships   . Social connections:     Talks on phone: Not on file     Gets together: Not on file     Attends religious service: Not on file     Active member of club or organization: Not on file     Attends meetings of clubs or organizations: Not on file     Relationship status: Not on file   . Intimate partner violence:     Fear of current or ex partner: Not on file     Emotionally abused: Not on file     Physically abused: Not on file     Forced sexual activity: Not on file   Other Topics Concern   . Not on file   Social History Narrative   . Not on file  Allergies:     Allergies   Allergen Reactions   . Singulair [Montelukast] Itching   . Latex    . Nitroglycerin    . Penicillins    . Tetracyclines & Related        Medications:     Medications Prior to Admission   Medication Sig   . acetaminophen (TYLENOL) 325 MG tablet Take 2 tablets (650 mg total) by mouth every 4 (four) hours as needed for Pain   . atorvastatin (LIPITOR) 40 MG tablet Take 40 mg by mouth daily   . butalbital-acetaminophen-caffeine (FIORICET, ESGIC) 50-325-40 MG per tablet Take 1 tablet by mouth every 6 (six) hours as needed for Headaches   . lamoTRIgine (LAMICTAL) 150 MG tablet Take 1 tablet (150 mg total) by mouth 2 (two) times daily   . levETIRAcetam (KEPPRA) 500 MG tablet Take 500 mg by mouth 2 (two) times daily   . metoprolol succinate XL (TOPROL-XL) 25 MG 24 hr tablet Take 25 mg by mouth daily   . pantoprazole (PROTONIX) 40 MG tablet Take 1 tablet (40 mg total) by mouth daily   . QUEtiapine (SEROQUEL) 50 MG tablet Take 1 tablet (50 mg total) by mouth nightly   . ranolazine (RANEXA) 500 MG 12 hr tablet Take 500 mg by mouth 2 (two) times daily   . rOPINIRole (REQUIP) 1 MG tablet Take 1 tablet (1 mg total) by mouth nightly   . furosemide (LASIX) 20 MG tablet Take 1 tablet (20 mg total) by mouth daily   . psyllium (METAMUCIL) 58.12 % Pack packet Take 1 packet by mouth  daily       Review of Systems:   A comprehensive review of systems was: History obtained from the patient  General ROS: negative for - chills, fever, malaise or sleep disturbance  Psychological ROS: negative for - anxiety  Ophthalmic ROS: negative for - blurry vision or double vision  ENT ROS: positive for neck painnegative for - epistaxis, headaches, hearing change, nasal congestion, nasal discharge, sinus pain, sore throat, tinnitus or vertigo  Allergy and Immunology ROS: negative for - hives or seasonal allergies  Hematological and Lymphatic ROS: negative for - bleeding problems, bruising or swollen lymph nodes  Endocrine ROS: negative for - temperature intolerance  Respiratory ROS: negative for - cough, hemoptysis, shortness of breath or stridor  Cardiovascular ROS: no chest pain or dyspnea on exertion  Gastrointestinal ROS: no abdominal pain, change in bowel habits, or black or bloody stools  Genito-Urinary ROS: no dysuria, trouble voiding, or hematuria  Musculoskeletal ROS: negative for - joint pain or joint swelling  Neurological ROS: negative for - dizziness or seizures  Dermatological ROS: negative for pruritus and rash    Physical Exam:     Vitals:    01/08/19 1641   BP: 112/61   Pulse: 78   Resp: 17   Temp: 98.4 F (36.9 C)   SpO2: 99%       Intake and Output Summary (Last 24 hours) at Date Time    Intake/Output Summary (Last 24 hours) at 01/08/2019 1714  Last data filed at 01/08/2019 1319  Gross per 24 hour   Intake 356 ml   Output -   Net 356 ml       General appearance - alert, well appearing, and in no distress  Mental status - alert, oriented to person, place, and time  Eyes - pupils equal and reactive, extraocular eye movements intact  Ears - bilateral TM's and external ear canals  normal, Auricles normal, no discharge no erythema  Nose - normal and patent, no erythema, no discharge or polyps, no mucosal erythema, no purulent rhinorrhea and sinuses normal and nontender, Turbinates normal, Septum  midline  Mouth - mucous membranes moist, pharynx normal without lesions and tongue normal  Neck - right anterior neck crepitus, no fluctuance, no overlying erythema, supple, no significant adenopathy  Lymphatics - no palpable lymphadenopathy  Chest - clear to auscultation, no wheezes, rales or rhonchi, symmetric air entry  Heart - normal rate and regular rhythm  Abdomen - soft, nontender, nondistended  Neurological - cranial nerves II through XII intact, motor and sensory grossly normal bilaterally  Musculoskeletal - no joint tenderness, deformity or swelling  Extremities -no clubbing,cyanosis or edema  Skin - normal coloration and turgor, no rashes    Labs:     Results     Procedure Component Value Units Date/Time    TSH [[id number]]  (Abnormal) Collected:  01/08/19 1123    Specimen:  Blood Updated:  01/08/19 1217     TSH 0.32 uIU/mL     Narrative:       Rescheduled by [id number] at 01/08/2019 05:05 Reason: Difficult draw/Unable   to collect specimen  Rescheduled by [id number] at 01/08/2019 10:15 Reason: Difficult draw/Unable   to collect specimen rn Marie aware    B-type Natriuretic Peptide [[id number]] Collected:  01/08/19 1123    Specimen:  Blood Updated:  01/08/19 1155     B-Natriuretic Peptide 33 pg/mL     Narrative:       Rescheduled by [id number] at 01/08/2019 05:05 Reason: Difficult draw/Unable   to collect specimen  Rescheduled by [id number] at 01/08/2019 10:15 Reason: Difficult draw/Unable   to collect specimen    Basic Metabolic Panel [[id number]]  (Abnormal) Collected:  01/08/19 1123    Specimen:  Blood Updated:  01/08/19 1153     Glucose 114 mg/dL      BUN 7 mg/dL      Creatinine 0.8 mg/dL      Calcium 8.8 mg/dL      Sodium 295 mEq/L      Potassium 5.6 mEq/L      Chloride 108 mEq/L      CO2 22 mEq/L      Anion Gap 8.0    Narrative:       Rescheduled by [id number] at 01/08/2019 05:05 Reason: Difficult draw/Unable   to collect specimen  Rescheduled by [id number] at 01/08/2019 10:15 Reason: Difficult draw/Unable   to collect specimen rn  Hilda Lias aware    GFR [[id number]] Collected:  01/08/19 1123     Updated:  01/08/19 1153     EGFR >60.0    Narrative:       Rescheduled by [id number] at 01/08/2019 05:05 Reason: Difficult draw/Unable   to collect specimen  Rescheduled by [id number] at 01/08/2019 10:15 Reason: Difficult draw/Unable   to collect specimen rn Hilda Lias aware    CBC without differential [[id number]]  (Abnormal) Collected:  01/08/19 0504    Specimen:  Blood Updated:  01/08/19 0549     WBC 4.37 x10 3/uL      Hgb 9.5 g/dL      Hematocrit 74.2 %      Platelets 207 x10 3/uL      RBC 3.57 x10 6/uL      MCV 86.3 fL      MCH 26.6 pg      MCHC 30.8 g/dL      RDW 16 %      MPV  10.4 fL      Nucleated RBC 0.0 /100 WBC      Absolute NRBC 0.00 x10 3/uL     CBC without differential [[id number]]  (Abnormal) Collected:  01/07/19 0504    Specimen:  Blood Updated:  01/07/19 0549     WBC 5.63 x10 3/uL      Hgb 9.4 g/dL      Hematocrit 40.9 %      Platelets 233 x10 3/uL      RBC 3.60 x10 6/uL      MCV 82.2 fL      MCH 26.1 pg      MCHC 31.8 g/dL      RDW 16 %      MPV 10.6 fL      Nucleated RBC 0.0 /100 WBC      Absolute NRBC 0.00 x10 3/uL     Hemoglobin A1C [[id number]] Collected:  01/06/19 0400    Specimen:  Blood Updated:  01/06/19 0913     Hemoglobin A1C 5.8 %      Average Estimated Glucose 119.8 mg/dL     Narrative:       This is NOT the correct Test for Patients with  Hemoglobinopathy.    Lipid panel [[id number]] Collected:  01/06/19 0400    Specimen:  Blood Updated:  01/06/19 0910     Cholesterol 130 mg/dL      Triglycerides 68 mg/dL      HDL 55 mg/dL      LDL Calculated 61 mg/dL      VLDL Cholesterol Cal 14 mg/dL      CHOL/HDL Ratio 2.4    Narrative:       This is NOT the correct Test for Patients with  Hemoglobinopathy.    Hemolysis index [[id number]] Collected:  01/06/19 0400     Updated:  01/06/19 0910     Hemolysis Index 5    Narrative:       This is NOT the correct Test for Patients with  Hemoglobinopathy.    Troponin I [[id number]] Collected:  01/06/19 0608     Specimen:  Blood Updated:  01/06/19 0656     Troponin I <0.01 ng/mL     GFR [[id number]] Collected:  01/06/19 0400     Updated:  01/06/19 0510     EGFR >60.0    Comprehensive metabolic panel [[id number]]  (Abnormal) Collected:  01/06/19 0400    Specimen:  Blood Updated:  01/06/19 0510     Glucose 99 mg/dL      BUN 6 mg/dL      Creatinine 0.8 mg/dL      Sodium 595 mEq/L      Potassium 4.2 mEq/L      Chloride 103 mEq/L      CO2 25 mEq/L      Calcium 9.1 mg/dL      Protein, Total 6.5 g/dL      Albumin 3.4 g/dL      AST (SGOT) 16 U/L      ALT 12 U/L      Alkaline Phosphatase 80 U/L      Bilirubin, Total 0.2 mg/dL      Globulin 3.1 g/dL      Albumin/Globulin Ratio 1.1     Anion Gap 12.0    Magnesium [[id number]] Collected:  01/06/19 0400    Specimen:  Blood Updated:  01/06/19 0510     Magnesium 1.7 mg/dL     Troponin I [[id number]] Collected:  01/06/19 0400  Specimen:  Blood Updated:  01/06/19 0504     Troponin I <0.01 ng/mL     CBC and differential [[id number]]  (Abnormal) Collected:  01/06/19 0400    Specimen:  Blood Updated:  01/06/19 0443     WBC 6.41 x10 3/uL      Hgb 9.9 g/dL      Hematocrit 40.9 %      Platelets 290 x10 3/uL      RBC 3.80 x10 6/uL      MCV 83.4 fL      MCH 26.1 pg      MCHC 31.2 g/dL      RDW 16 %      MPV 9.8 fL      Neutrophils 50.3 %      Lymphocytes Automated 36.7 %      Monocytes 8.0 %      Eosinophils Automated 3.9 %      Basophils Automated 0.6 %      Immature Granulocyte 0.5 %      Nucleated RBC 0.0 /100 WBC      Neutrophils Absolute 3.23 x10 3/uL      Abs Lymph Automated 2.35 x10 3/uL      Abs Mono Automated 0.51 x10 3/uL      Abs Eos Automated 0.25 x10 3/uL      Absolute Baso Automated 0.04 x10 3/uL      Absolute Immature Granulocyte 0.03 x10 3/uL      Absolute NRBC 0.00 x10 3/uL     PT/APTT [[id number]]  (Abnormal) Collected:  01/05/19 2213     Updated:  01/05/19 2345     PT 12.6 sec      PT INR 1.0     PTT 22 sec     B-type Natriuretic Peptide [[id number]] Collected:  01/05/19 2213     Specimen:  Blood Updated:  01/05/19 2251     B-Natriuretic Peptide <10 pg/mL     Troponin I [[id number]] Collected:  01/05/19 2213    Specimen:  Blood Updated:  01/05/19 2249     Troponin I <0.01 ng/mL     Comprehensive metabolic panel [[id number]]  (Abnormal) Collected:  01/05/19 2213    Specimen:  Blood Updated:  01/05/19 2243     Glucose 107 mg/dL      BUN 7 mg/dL      Creatinine 0.9 mg/dL      Sodium 010 mEq/L      Potassium 4.2 mEq/L      Chloride 103 mEq/L      CO2 25 mEq/L      Calcium 9.4 mg/dL      Protein, Total 6.7 g/dL      Albumin 3.4 g/dL      AST (SGOT) 18 U/L      ALT 14 U/L      Alkaline Phosphatase 82 U/L      Bilirubin, Total 0.1 mg/dL      Globulin 3.3 g/dL      Albumin/Globulin Ratio 1.0     Anion Gap 11.0    GFR [[id number]] Collected:  01/05/19 2213     Updated:  01/05/19 2243     EGFR >60.0    CBC and differential [[id number]]  (Abnormal) Collected:  01/05/19 2213    Specimen:  Blood Updated:  01/05/19 2223     WBC 6.68 x10 3/uL      Hgb 10.0 g/dL      Hematocrit 63.8 %      Platelets 221 x10  3/uL      RBC 3.82 x10 6/uL      MCV 84.0 fL      MCH 26.2 pg      MCHC 31.2 g/dL      RDW 16 %      MPV 10.4 fL      Neutrophils 54.2 %      Lymphocytes Automated 32.5 %      Monocytes 8.5 %      Eosinophils Automated 3.6 %      Basophils Automated 0.6 %      Immature Granulocyte 0.6 %      Nucleated RBC 0.0 /100 WBC      Neutrophils Absolute 3.62 x10 3/uL      Abs Lymph Automated 2.17 x10 3/uL      Abs Mono Automated 0.57 x10 3/uL      Abs Eos Automated 0.24 x10 3/uL      Absolute Baso Automated 0.04 x10 3/uL      Absolute Immature Granulocyte 0.04 x10 3/uL      Absolute NRBC 0.00 x10 3/uL             Rads:   Radiological Procedure reviewed.    Ct Head Wo Contrast    Result Date: 01/06/2019  CT HEAD WO CONTRAST:01/06/2019 1:30 PM CLINICAL HISTORY:    .TIA, initial exam INDICATION:TIA, initial exam    TECHNIQUE: Axial images were obtained from the skull base to vertex without contrast. FINDINGS: The  ventricles and sulci are moderately prominent but consistent with patient's chronologic age. Decreased density is present in the deep white matter of each hemisphere consistent with small vessel disease change. No intracerebral mass or hemorrhage is present, and no extra-axial collections are present.      No acute intracerebral abnormality. Note: Note that CT scanning at this site  utilizes multiple dose reduction techniques including automatic exposure control, adjustment of the MAA and/or KVP according to patient's size and use of iterative reconstruction technique Laurena Slimmer, MD 01/06/2019 1:35 PM    Ct Soft Tissue Neck W Contrast    Result Date: 01/06/2019  CT SOFT TISSUE NECK W CONTRAST 01/06/2019 1:32 PM Clinical history: Abscess, pharynx Comparison: none available. Technique: Axial images are obtained through the neck after administration of intravenous contrast. Sagittal and coronal reformations were made. Contrast dose: Omnipaque 350, 85 ml The following ?dose reduction techniques were utilized: automated exposure control and/or adjustment of the mA and/or kV according to patient size, and the use of iterative reconstruction technique. FINDINGS: Extensive subcutaneous emphysema in the right aspect of the neck extending to the parapharyngeal region. The subcutaneous emphysema extends from C1/C2 level up to the anterior thoracic wall. There is nonspecific edema in the right aspect of the neck. There is fluid and edema in the anterior subcutaneous tissues at the anterior superior thoracic wall at the level of the manubriosternal joint more pronounced on the right. Normal aeration in the visualized mastoids and middle ear cavities. Small amount of secretions in the right posterior ethmoidal air cells. Edema in the tongue. Normal appearance of the bilateral parotid and submandibular glands. The adenoids are unremarkable. There is mild prominence of bilateral palatine tonsils more pronounced on the left side  without obvious collection. The soft palate, epiglottis and larynx imaging normal. The thyroid gland is minimally heterogeneous. Limited examination of the lung apex is unremarkable. Normal size lymph nodes at the bilateral jugular chains and posterior triangles. There is appropriate enhancement of the vasculature. There is  mild loss of height at the intervertebral disc of C5/C6. The osseous structures are intact. The lung apices have normal appearance without pneumothorax.      Extensive subcutaneous emphysema in the right aspect of the neck extending to the right parapharyngeal region. The subcutaneous emphysema extends from just C1/C2 level to the anterior thoracic wall. Nonspecific edema and small amount of fluid in the anterior superior chest wall at the level off the manubriosternal joint more pronounced on the right. This is nonspecific and be related to prior trauma, however extensive infection could have similar appearance. No evidence of drainable abscess. There is mild prominence of the bilateral pontine tonsils without collections. Nonspecific tongue edema. These     findings and/or recommendations were called to   Dr. Bluford Main at time of report signature on    01/06/2019 4:17 PM.  Joselyn Glassman, MD 01/06/2019 4:17 PM    Xr Chest  Ap Portable    Result Date: 01/06/2019  Examination: Portable chest. HISTORY: Pain shortness of breath. COMPARISON: 08/22/2018. FINDINGS: Cardiac device overlie left chest. Heart size normal. Atherosclerotic aorta. Lungs clear. No pleural collections. No acute osseous process.     No acute process. Adaline Sill, MD 01/06/2019 12:22 AM         Signed by: Cecilie Kicks, MD

## 2019-01-09 DIAGNOSIS — D649 Anemia, unspecified: Secondary | ICD-10-CM

## 2019-01-09 DIAGNOSIS — G40909 Epilepsy, unspecified, not intractable, without status epilepticus: Secondary | ICD-10-CM

## 2019-01-09 DIAGNOSIS — I480 Paroxysmal atrial fibrillation: Secondary | ICD-10-CM

## 2019-01-09 DIAGNOSIS — J4520 Mild intermittent asthma, uncomplicated: Secondary | ICD-10-CM

## 2019-01-09 DIAGNOSIS — I10 Essential (primary) hypertension: Secondary | ICD-10-CM

## 2019-01-09 DIAGNOSIS — Z8719 Personal history of other diseases of the digestive system: Secondary | ICD-10-CM

## 2019-01-09 LAB — CBC
Absolute NRBC: 0 10*3/uL (ref 0.00–0.00)
Hematocrit: 31.5 % — ABNORMAL LOW (ref 34.7–43.7)
Hgb: 9.9 g/dL — ABNORMAL LOW (ref 11.4–14.8)
MCH: 26.1 pg (ref 25.1–33.5)
MCHC: 31.4 g/dL — ABNORMAL LOW (ref 31.5–35.8)
MCV: 83.1 fL (ref 78.0–96.0)
MPV: 11 fL (ref 8.9–12.5)
Nucleated RBC: 0 /100 WBC (ref 0.0–0.0)
Platelets: 212 10*3/uL (ref 142–346)
RBC: 3.79 10*6/uL — ABNORMAL LOW (ref 3.90–5.10)
RDW: 16 % — ABNORMAL HIGH (ref 11–15)
WBC: 4.33 10*3/uL (ref 3.10–9.50)

## 2019-01-09 LAB — BASIC METABOLIC PANEL
Anion Gap: 12 (ref 5.0–15.0)
BUN: 7 mg/dL (ref 7–19)
CO2: 21 mEq/L — ABNORMAL LOW (ref 22–29)
Calcium: 8.8 mg/dL (ref 8.5–10.5)
Chloride: 109 mEq/L (ref 100–111)
Creatinine: 0.8 mg/dL (ref 0.6–1.0)
Glucose: 92 mg/dL (ref 70–100)
Potassium: 4.5 mEq/L (ref 3.5–5.1)
Sodium: 142 mEq/L (ref 136–145)

## 2019-01-09 LAB — GFR: EGFR: >60

## 2019-01-09 MED ORDER — APIXABAN 5 MG PO TABS
5.00 mg | ORAL_TABLET | Freq: Two times a day (BID) | ORAL | 0 refills | Status: AC
Start: 2019-01-09 — End: ?

## 2019-01-09 MED ORDER — AMLODIPINE BESYLATE 2.5 MG PO TABS
2.5000 mg | ORAL_TABLET | Freq: Every day | ORAL | 0 refills | Status: DC
Start: 2019-01-10 — End: 2022-01-03

## 2019-01-09 MED ORDER — DIPHENHYDRAMINE HCL 50 MG/ML IJ SOLN
6.25 mg | Freq: Once | INTRAMUSCULAR | Status: AC
Start: 2019-01-09 — End: 2019-01-09
  Administered 2019-01-09: 6.25 mg via INTRAVENOUS
  Filled 2019-01-09: qty 1

## 2019-01-09 MED ORDER — SODIUM CHLORIDE 0.9 % IV SOLN
12.50 mg | Freq: Three times a day (TID) | INTRAVENOUS | Status: DC | PRN
Start: 2019-01-09 — End: 2019-01-09
  Administered 2019-01-09: 12.5 mg via INTRAVENOUS
  Filled 2019-01-09: qty 1

## 2019-01-09 MED ORDER — CLINDAMYCIN HCL 300 MG PO CAPS
300.00 mg | ORAL_CAPSULE | Freq: Three times a day (TID) | ORAL | 0 refills | Status: AC
Start: 2019-01-09 — End: 2019-01-12

## 2019-01-09 MED ORDER — DOCUSATE SODIUM 100 MG PO CAPS
100.00 mg | ORAL_CAPSULE | Freq: Every day | ORAL | Status: DC
Start: 2019-01-09 — End: 2019-01-09
  Administered 2019-01-09: 100 mg via ORAL
  Filled 2019-01-09: qty 1

## 2019-01-09 MED ORDER — DIPHENHYDRAMINE HCL 50 MG/ML IJ SOLN
12.50 mg | INTRAMUSCULAR | Status: DC | PRN
Start: 2019-01-09 — End: 2019-01-09
  Administered 2019-01-09 (×4): 12.5 mg via INTRAVENOUS
  Filled 2019-01-09 (×4): qty 1

## 2019-01-09 MED ORDER — RISAQUAD PO CAPS
1.00 | ORAL_CAPSULE | Freq: Every day | ORAL | 0 refills | Status: DC
Start: 2019-01-09 — End: 2020-07-07

## 2019-01-09 NOTE — Plan of Care (Signed)
Patient alert and oriented x 4. Pt reported pain throughout shift, given IV PRN medication, see MAR. Pt tolerated well. Prn nausea medication given. Given IV antibiotics as ordered. Pt awaiting consultation for endoscopic exam.   Problem: Moderate/High Fall Risk Score >5  Goal: Patient will remain free of falls  Outcome: Progressing  Flowsheets (Taken 01/09/2019 1225)  Moderate Risk (6-13): LOW-Fall Interventions Appropriate for Low Fall Risk  High (Greater than 13): MOD-Remain with patient during toileting  VH Moderate Risk (6-13): ALL REQUIRED LOW INTERVENTIONS; YELLOW NON-SKID SLIPPERS     Problem: Safety  Goal: Patient will be free from injury during hospitalization  Outcome: Progressing  Flowsheets (Taken 01/09/2019 1225)  Patient will be free from injury during hospitalization : Assess patient's risk for falls and implement fall prevention plan of care per policy; Provide and maintain safe environment; Use appropriate transfer methods; Hourly rounding; Include patient/ family/ care giver in decisions related to safety; Ensure appropriate safety devices are available at the bedside; Assess for patients risk for elopement and implement Elopement Risk Plan per policy  Goal: Patient will be free from infection during hospitalization  Outcome: Progressing  Flowsheets (Taken 01/09/2019 1225)  Free from Infection during hospitalization: Assess and monitor for signs and symptoms of infection; Monitor lab/diagnostic results; Monitor all insertion sites (i.e. indwelling lines, tubes, urinary catheters, and drains); Encourage patient and family to use good hand hygiene technique     Problem: Pain  Goal: Pain at adequate level as identified by patient  Outcome: Progressing  Flowsheets (Taken 01/09/2019 1225)  Pain at adequate level as identified by patient: Identify patient comfort function goal; Assess for risk of opioid induced respiratory depression, including snoring/sleep apnea. Alert healthcare team of risk factors  identified.; Assess pain on admission, during daily assessment and/or before any "as needed" intervention(s); Reassess pain within 30-60 minutes of any procedure/intervention, per Pain Assessment, Intervention, Reassessment (AIR) Cycle; Evaluate if patient comfort function goal is met; Evaluate patient's satisfaction with pain management progress; Offer non-pharmacological pain management interventions; Consult/collaborate with Physical Therapy, Occupational Therapy, and/or Speech Therapy; Include patient/patient care companion in decisions related to pain management as needed     Problem: Side Effects from Pain Analgesia  Goal: Patient will experience minimal side effects of analgesic therapy  Outcome: Progressing  Flowsheets (Taken 01/09/2019 1225)  Patient will experience minimal side effects of analgesic therapy: Monitor/assess patient's respiratory status (RR depth, effort, breath sounds); Assess for changes in cognitive function; Prevent/manage side effects per LIP orders (i.e. nausea, vomiting, pruritus, constipation, urinary retention, etc.); Evaluate for opioid-induced sedation with appropriate assessment tool (i.e. POSS)     Problem: Discharge Barriers  Goal: Patient will be discharged home or other facility with appropriate resources  Outcome: Progressing  Flowsheets (Taken 01/09/2019 1225)  Discharge to home or other facility with appropriate resources: Provide appropriate patient education; Provide information on available health resources; Initiate discharge planning     Problem: Psychosocial and Spiritual Needs  Goal: Demonstrates ability to cope with hospitalization/illness  Outcome: Progressing  Flowsheets (Taken 01/09/2019 1225)  Demonstrates ability to cope with hospitalizations/illness: Encourage verbalization of feelings/concerns/expectations; Provide quiet environment; Assist patient to identify own strengths and abilities; Encourage patient to set small goals for self; Include patient/ patient  care companion in decisions     Problem: Compromised Hemodynamic Status  Goal: Vital signs and fluid balance maintained/improved  Outcome: Progressing  Flowsheets (Taken 01/09/2019 1225)  Vital signs and fluid balance are maintained/improved: Position patient for maximum circulation/cardiac output; Monitor/assess vitals and  hemodynamic parameters with position changes; Monitor/assess lab values and report abnormal values; Monitor intake and output. Notify LIP if urine output is less than 30 mL/hour.     Problem: Inadequate Gas Exchange  Goal: Adequate oxygenation and improved ventilation  Outcome: Progressing  Flowsheets (Taken 01/09/2019 1225)  Adequate oxygenation and improved ventilation: Assess lung sounds; Monitor SpO2 and treat as needed; Provide mechanical and oxygen support to facilitate gas exchange; Position for maximum ventilatory efficiency; Teach/reinforce use of incentive spirometer 10 times per hour while awake, cough and deep breath as needed; Plan activities to conserve energy: plan rest periods; Increase activity as tolerated/progressive mobility  Goal: Patent Airway maintained  Outcome: Progressing  Flowsheets (Taken 01/09/2019 1225)  Patent airway maintained : Position patient for maximum ventilatory efficiency; Reinforce use of ordered respiratory interventions (i.e. CPAP, BiPAP, Incentive Spirometer, Acapella, etc.); Reposition patient every 2 hours and as needed unless able to self-reposition     Problem: Inadequate Airway Clearance  Goal: Normal respiratory rate/effort achieved/maintained  Outcome: Progressing  Flowsheets (Taken 01/09/2019 1225)  Normal respiratory rate/effort achieved/maintained: Plan activities to conserve energy: plan rest periods     Problem: Nutrition  Goal: Nutritional intake is adequate  Outcome: Progressing  Flowsheets (Taken 01/09/2019 1225)  Nutritional intake is adequate: Assist patient with meals/food selection; Allow adequate time for meals; Encourage/perform oral  hygiene as appropriate; Include patient/patient care companion in decisions related to nutrition; Assess anorexia, appetite, and amount of meal/food tolerated     Problem: Pain interferes with ability to perform ADL  Goal: Pain at adequate level as identified by patient  Outcome: Progressing  Flowsheets (Taken 01/09/2019 1225)  Pain at adequate level as identified by patient: Identify patient comfort function goal; Assess for risk of opioid induced respiratory depression, including snoring/sleep apnea. Alert healthcare team of risk factors identified.; Assess pain on admission, during daily assessment and/or before any "as needed" intervention(s); Reassess pain within 30-60 minutes of any procedure/intervention, per Pain Assessment, Intervention, Reassessment (AIR) Cycle; Evaluate if patient comfort function goal is met; Evaluate patient's satisfaction with pain management progress; Offer non-pharmacological pain management interventions; Consult/collaborate with Physical Therapy, Occupational Therapy, and/or Speech Therapy; Include patient/patient care companion in decisions related to pain management as needed

## 2019-01-09 NOTE — Discharge Instr - AVS First Page (Addendum)
Reason for your Hospital Admission:  Chest Pain      Instructions for after your discharge:  Follow up with [facility] Providers

## 2019-01-09 NOTE — Plan of Care (Signed)
The learning abilities of the patient and/or caregiver have been assessed. Today's individualized plan of care includes fall prevention, pain management and IV antibiotics.  Problem: Moderate/High Fall Risk Score >5  Goal: Patient will remain free of falls  Outcome: Progressing  Flowsheets (Taken 01/09/2019 0227)  Moderate Risk (6-13): MOD-Apply bed exit alarm if patient is confused; MOD-Consider a move closer to Nurses Station; MOD-Remain with patient during toileting     Problem: Safety  Goal: Patient will be free from injury during hospitalization  Outcome: Progressing  Flowsheets (Taken 01/09/2019 0227)  Patient will be free from injury during hospitalization : Provide and maintain safe environment; Use appropriate transfer methods; Ensure appropriate safety devices are available at the bedside; Hourly rounding; Include patient/ family/ care giver in decisions related to safety; Assess patient's risk for falls and implement fall prevention plan of care per policy     Problem: Pain  Goal: Pain at adequate level as identified by patient  Outcome: Progressing  Flowsheets (Taken 01/09/2019 0227)  Pain at adequate level as identified by patient: Assess for risk of opioid induced respiratory depression, including snoring/sleep apnea. Alert healthcare team of risk factors identified.; Reassess pain within 30-60 minutes of any procedure/intervention, per Pain Assessment, Intervention, Reassessment (AIR) Cycle; Evaluate if patient comfort function goal is met; Evaluate patient's satisfaction with pain management progress     Problem: Compromised Hemodynamic Status  Goal: Vital signs and fluid balance maintained/improved  Outcome: Progressing  Flowsheets (Taken 01/09/2019 0227)  Vital signs and fluid balance are maintained/improved: Monitor/assess vitals and hemodynamic parameters with position changes; Monitor and compare daily weight; Monitor intake and output. Notify LIP if urine output is less than 30 mL/hour.;  Monitor/assess lab values and report abnormal values; Position patient for maximum circulation/cardiac output     Problem: Inadequate Gas Exchange  Goal: Adequate oxygenation and improved ventilation  Outcome: Progressing  Flowsheets (Taken 01/09/2019 0227)  Adequate oxygenation and improved ventilation: Teach/reinforce use of incentive spirometer 10 times per hour while awake, cough and deep breath as needed; Plan activities to conserve energy: plan rest periods; Increase activity as tolerated/progressive mobility; Consult/collaborate with Respiratory Therapy; Provide mechanical and oxygen support to facilitate gas exchange     Problem: Inadequate Airway Clearance  Goal: Normal respiratory rate/effort achieved/maintained  Outcome: Progressing  Flowsheets (Taken 01/09/2019 0227)  Normal respiratory rate/effort achieved/maintained: Plan activities to conserve energy: plan rest periods     Problem: Pain interferes with ability to perform ADL  Goal: Pain at adequate level as identified by patient  Outcome: Progressing  Flowsheets (Taken 01/09/2019 0227)  Pain at adequate level as identified by patient: Assess for risk of opioid induced respiratory depression, including snoring/sleep apnea. Alert healthcare team of risk factors identified.; Reassess pain within 30-60 minutes of any procedure/intervention, per Pain Assessment, Intervention, Reassessment (AIR) Cycle; Evaluate if patient comfort function goal is met; Evaluate patient's satisfaction with pain management progress     The patient and/or caregiver agree to the plan of care and demonstrate understanding of the disease process, treatment plan, medications, and consequences of noncompliance. All questions and concerns were addressed.

## 2019-01-09 NOTE — Progress Notes (Signed)
01/09/19 1738   Discharge Disposition   Patient preference/choice provided? N/A   Physical Discharge Disposition Home   Name of Home Health Agency Placement   (N/A)   Name of Hospice Agency   (N/A)   Name of DME Agency   (N/A)   Name of Infusion Company Placement   (N/A)   Receiving facility, unit and room number:   (N/A)   Nursing report phone number:   (N/A)   Facility fax number:   (N/A)   Mode of Transportation Car   Patient/Family/POA notified of transfer plan Yes   Patient agreeable to discharge plan/expected d/c date? Yes   Family/POA agreeable to discharge plan/expected d/c date? Yes   Bedside nurse notified of transport plan? Yes   Special requirements for patient during transport:   (N/A)   CM Interventions   Follow up appointment scheduled? No   Reason no follow up scheduled? Family to schedule   Referral made for home health RN visit? No, Other (comment)   Multidisciplinary rounds/family meeting before d/c? Yes   Medicare Checklist   Is this a Medicare patient? No   Patient received 1st IMM Letter? n/a

## 2019-01-09 NOTE — Progress Notes (Signed)
Follow Up Note  [facility]  [phone]      Date Time: 01/09/19 3:39 PM  Patient Name: [name]  Attending Physician: Mahlon Gammon, Belva Crome,*    Assessment:   Right neck pain  SubQ air likely from traumatic attempt at EJ placement  No abscess or drainable collection on CT neck  No airway compromise or abnormal finding on laryngoscopy performed today    Plan:   Continue antibiotics   Cold compresses on neck, pain control  Airway stable  Follow-up with ENT as outpatient to monitor progress    Interval History:   Patient reports no interval events overnight.  No respiratory events.  Reports persistent right neck pain.    Past Medical History:     Past Medical History:   Diagnosis Date   . Asthma    . Convulsions    . Diabetes mellitus     diet controlled    . Hyperlipidemia    . Hypertension    . TIA (transient ischemic attack)        Past Surgical History:     Past Surgical History:   Procedure Laterality Date   . HYSTERECTOMY     . pci         Family History:   No family history on file.    Social History:     Social History     Socioeconomic History   . Marital status: Divorced     Spouse name: Not on file   . Number of children: Not on file   . Years of education: Not on file   . Highest education level: Not on file   Occupational History   . Not on file   Social Needs   . Financial resource strain: Not on file   . Food insecurity:     Worry: Not on file     Inability: Not on file   . Transportation needs:     Medical: Not on file     Non-medical: Not on file   Tobacco Use   . Smoking status: Never Smoker   . Smokeless tobacco: Never Used   Substance and Sexual Activity   . Alcohol use: Never     Frequency: Never   . Drug use: Never   . Sexual activity: Not on file   Lifestyle   . Physical activity:     Days per week: Not on file     Minutes per session: Not on file   . Stress: Not on file   Relationships   . Social connections:     Talks on phone: Not on file     Gets together: Not on  file     Attends religious service: Not on file     Active member of club or organization: Not on file     Attends meetings of clubs or organizations: Not on file     Relationship status: Not on file   . Intimate partner violence:     Fear of current or ex partner: Not on file     Emotionally abused: Not on file     Physically abused: Not on file     Forced sexual activity: Not on file   Other Topics Concern   . Not on file   Social History Narrative   . Not on file       Allergies:     Allergies   Allergen Reactions   . Singulair [Montelukast] Itching   .  Latex    . Nitroglycerin    . Penicillins    . Tetracyclines & Related        Medications:     Medications Prior to Admission   Medication Sig   . acetaminophen (TYLENOL) 325 MG tablet Take 2 tablets (650 mg total) by mouth every 4 (four) hours as needed for Pain   . atorvastatin (LIPITOR) 40 MG tablet Take 40 mg by mouth daily   . butalbital-acetaminophen-caffeine (FIORICET, ESGIC) 50-325-40 MG per tablet Take 1 tablet by mouth every 6 (six) hours as needed for Headaches   . lamoTRIgine (LAMICTAL) 150 MG tablet Take 1 tablet (150 mg total) by mouth 2 (two) times daily   . levETIRAcetam (KEPPRA) 500 MG tablet Take 500 mg by mouth 2 (two) times daily   . metoprolol succinate XL (TOPROL-XL) 25 MG 24 hr tablet Take 25 mg by mouth daily   . pantoprazole (PROTONIX) 40 MG tablet Take 1 tablet (40 mg total) by mouth daily   . QUEtiapine (SEROQUEL) 50 MG tablet Take 1 tablet (50 mg total) by mouth nightly   . ranolazine (RANEXA) 500 MG 12 hr tablet Take 500 mg by mouth 2 (two) times daily   . rOPINIRole (REQUIP) 1 MG tablet Take 1 tablet (1 mg total) by mouth nightly   . furosemide (LASIX) 20 MG tablet Take 1 tablet (20 mg total) by mouth daily   . psyllium (METAMUCIL) 58.12 % Pack packet Take 1 packet by mouth daily         Review of Systems:    A comprehensive review of systems was: History obtained from the patient  General ROS: negative for - chills, fever, malaise or  sleep disturbance  Psychological ROS: negative for - anxiety  Ophthalmic ROS: negative for - blurry vision or double vision  ENT ROS: positive for neck pain negative for - epistaxis, headaches, hearing change, nasal congestion, nasal discharge, sinus pain, sore throat, tinnitus or vertigo  Hematological and Lymphatic ROS: negative for - bleeding problems, bruising or swollen lymph nodes  Respiratory ROS: negative for - cough, hemoptysis, shortness of breath or stridor  Dermatological ROS: negative for pruritus and rash    Physical Exam:     Vitals:    01/09/19 1336   BP: 127/63   Pulse: 91   Resp:    Temp:    SpO2:        Intake and Output Summary (Last 24 hours) at Date Time    Intake/Output Summary (Last 24 hours) at 01/09/2019 1539  Last data filed at 01/09/2019 1315  Gross per 24 hour   Intake 654 ml   Output -   Net 654 ml     General appearance - alert, well appearing, and in no distress  Mental status - alert, oriented to person, place, and time  Eyes - pupils equal and reactive, extraocular eye movements intact  Ears - bilateral TM's and external ear canals normal, Auricles normal, no discharge no erythema  Nose - normal and patent, no erythema, no discharge or polyps, no mucosal erythema, no purulent rhinorrhea and sinuses normal and nontender, Turbinates normal, Septum midline  Mouth - mucous membranes moist, pharynx normal without lesions and tongue normal  Neck - right anterior neck crepitus improving, no fluctuance, no overlying erythema, supple, no significant adenopathy  Lymphatics - no palpable lymphadenopathy  Musculoskeletal - no joint tenderness, deformity or swelling  Extremities -no clubbing,cyanosis or edema  Skin - normal coloration and turgor, no rashes  Procedure - Fiberoptic Laryngoscopy    Anesthesia - Topical Lidocaine    The procedure was described to the patient who agrees to proceed. The topical lidocaine was sprayed in the nasal cavity. Having achieved local anesthesia the  fiberoptic scope was advanced into the nasal cavity. No lesions were noted. The nasopharynx was free and clear of any lesions. The laryngoscope was advanced into the hypopharynx without difficulty. The epiglottis, base of tongue, aryepiglottic folds and base of tongue were free and clear of any lesions. The larynx was visualized and and the vocal cords move well. There are no endolaryngeal lesions. The arytenoids display no edema and move normally.    Labs:     Results     Procedure Component Value Units Date/Time    Basic Metabolic Panel [[id number]]  (Abnormal) Collected:  01/09/19 0750     Updated:  01/09/19 0843     Glucose 92 mg/dL      BUN 7 mg/dL      Creatinine 0.8 mg/dL      Calcium 8.8 mg/dL      Sodium 213 mEq/L      Potassium 4.5 mEq/L      Chloride 109 mEq/L      CO2 21 mEq/L      Anion Gap 12.0    GFR [[id number]] Collected:  01/09/19 0750     Updated:  01/09/19 0843     EGFR >60.0    CBC without differential [[id number]]  (Abnormal) Collected:  01/09/19 0509    Specimen:  Blood Updated:  01/09/19 0631     WBC 4.33 x10 3/uL      Hgb 9.9 g/dL      Hematocrit 24.4 %      Platelets 212 x10 3/uL      RBC 3.79 x10 6/uL      MCV 83.1 fL      MCH 26.1 pg      MCHC 31.4 g/dL      RDW 16 %      MPV 11.0 fL      Nucleated RBC 0.0 /100 WBC      Absolute NRBC 0.00 x10 3/uL           Recent Labs   Lab 01/09/19  0509 01/08/19  0504 01/07/19  0504   WBC 4.33 4.37 5.63   Hgb 9.9* 9.5* 9.4*   Hematocrit 31.5* 30.8* 29.6*   Platelets 212 207 233         Rads:   Radiological Procedure reviewed.  Radiology Results (24 Hour)     ** No results found for the last 24 hours. **          Signed by: Cecilie Kicks, MD

## 2019-01-09 NOTE — Progress Notes (Signed)
01/09/19 1513   Patient Type   Within 30 Days of Previous Admission? No   Healthcare Decisions   Interviewed: Patient   English as a second language teacher Information: same   Orientation/Decision Making Abilities of Patient Alert and Oriented x3, able to make decisions   Healthcare Agent Appointed No   (RETIRED) Healthcare Agent's Name N/A   (RETIRED) Healthcare Agent's Phone Number N/A   Prior to admission   Prior level of function Needs assistance with ADLs   Type of Residence Private residence   Have running water, electricity, heat, etc? Yes   Living Arrangements Family members   How do you get to your MD appointments? daughter   How do you get your groceries? daughter   Who fixes your meals? daughter   Who does your laundry? daughter   Who picks up your prescriptions? daughter   Dressing Independent   Grooming Independent   Feeding Independent   Bathing Needs assistance   Toileting Independent   DME Currently at [facility] [employer]   Name of Prior Assisted Living Facility N/A   Prior SNF admission? (Detail) N/A   Prior Rehab admission? (Detail) N/A   Adult Protective Services (APS) involved? No   Discharge Planning   Support Systems Family members   Patient expects to be discharged to: home   Anticipated [place] plan discussed with: Same as interviewed   Mode of transportation: Private car (family member)   Consults/Providers   PT Evaluation Needed 2   OT Evalulation Needed 2   SLP Evaluation Needed 2   Outcome Palliative Care Screen Screened but did not meet criteria for intervention   Correct PCP listed in Epic? Yes   PCP   PCP on file was verified as the current PCP? Yes   Important Message from [facility] Notice   Patient received 1st IMM Letter? n/a

## 2019-01-09 NOTE — Progress Notes (Signed)
01/09/2019 pend fiberoptic scope exam, cont. With IV ABX..IVF.Marland KitchenDCP Home with family, patient ives with her daughter.

## 2019-01-09 NOTE — Discharge Summary (Signed)
MEDICINE DISCHARGE SUMMARY    Date Time: 01/09/19 5:10 PM  Patient Name: [name]  Attending Physician: Mahlon Gammon, Belva Crome,*  Primary Care Physician: Malcolm Metro, MD    Date of Admission: 01/05/2019  Date of Discharge: 01/09/2019    Discharge Diagnoses:     . traumatic subcutaneous emphysema   . Chest pain with high risk of acute coronary syndrome   . Paroxysmal atrial fibrillation   . Hypertension   . Hyperlipidemia   . Seizure disorder   . History of gastrointestinal hemorrhage   . Depression   . Anemia   . Asthma           Disposition:    Disposition: home    Pending Results, Recommendations & Instructions to providers after discharge:     1. Micro / Labs / Path pending:   Unresulted Labs     None            Procedures/Radiology performed:   Radiology: all results from this admission  Ct Head Wo Contrast    Result Date: 01/06/2019   No acute intracerebral abnormality. Note: Note that CT scanning at this site  utilizes multiple dose reduction techniques including automatic exposure control, adjustment of the MAA and/or KVP according to patient's size and use of iterative reconstruction technique Laurena Slimmer, MD 01/06/2019 1:35 PM    Ct Soft Tissue Neck W Contrast    Result Date: 01/06/2019   Extensive subcutaneous emphysema in the right aspect of the neck extending to the right parapharyngeal region. The subcutaneous emphysema extends from just C1/C2 level to the anterior thoracic wall. Nonspecific edema and small amount of fluid in the anterior superior chest wall at the level off the manubriosternal joint more pronounced on the right. This is nonspecific and be related to prior trauma, however extensive infection could have similar appearance. No evidence of drainable abscess. There is mild prominence of the bilateral pontine tonsils without collections. Nonspecific tongue edema. These     findings and/or recommendations were called to   Dr. Bluford Main at time of report signature on    01/06/2019 4:17  PM.  Joselyn Glassman, MD 01/06/2019 4:17 PM    Xr Chest  Ap Portable    Result Date: 01/06/2019  No acute process. Adaline Sill, MD 01/06/2019 12:22 AM    Surgery: all results from this admission  * No surgery found [facility] Course:     Reason for admission/ HPI: chest pain      Hospital Course:  63 y.o.femalewith past medical history of Hypertension,Paroxysmal atrial fibrillationon eliquis,Hyperlipidemia, asthma, type 2 diabetes,Depression,  GIB, and seizure disorderpresented  witha compliant of chest pain.  She developed neck pain and swelling after right EJ attempt.  Ct neck soft tissue  Revealed Extensive subcutaneous emphysema in the right aspect of the neck extending to the right parapharyngeal region.  she was seen by cardiology who felt she had atypical chest pain at this admission with no evidence of ACS.  Cardiology recommended continue Norvasc metoprolol Eliquis ranolazine ,Lipitor  She was given IV antibiotics for right neck swelling , analgesics.  She was seen by ENT and received endoscopy which was normal.  Right subcutaneous emphysema was assumed due to manipulation during EJ line placement.  She gradually improved and right neck subcutaneous emphysema decreased significantly and now she is going to be discharged home follow-up with the primary care physician next week.    Chest pain  - Atypical   -  has  negative nuclear stress test on 06/20/2018  - ECHO 07/09/2018 showed normal EF and moderate AR.  - Cardiac catheterization in 2014 and in 2017 were negative for CAD  -  Normal trop ,EKG noted  - Cardiology note and recommendation appreciated  - Continue Norvasc ,metoprolol ,Eliquis , ranolazine ,Lipitor  - Continue analgesics    Subcutaneous  Rt neck emphysema  -Likely due EJ line placement   - normal endoscopy noted   -cont' with antibiotics for additinal 3 days      Hx of atrial fibrillation-  Continue metoprolol, Eliquis      Hyperlipidemia-   -continue lipitor    H/O  Seizure  disorder-  Continue keppra and lamictal    DM  -  Diet cotrol in home   - SSI,FS monitoring    H/O Gi bleeding  - Gi prophylaxy  - f/u CBC    H/O asthma  - stable , no wheezing , f/u pulse ox         Discharge condition: stable    Discharge Day Exam:  Today:  BP 97/65   Pulse 85   Temp 98.2 F (36.8 C) (Oral)   Resp 17   Ht 1.651 m (5\' 5" )   Wt 98.2 kg (216 lb 6.4 oz)   LMP  (LMP Unknown)   SpO2 99%   BMI 36.01 kg/m   Ranges for the last 24 hours:  Temp:  [98.1 F (36.7 C)-98.6 F (37 C)] 98.2 F (36.8 C)  Heart Rate:  [71-91] 85  Resp Rate:  [14-18] 17  BP: (93-127)/(60-72) 97/65  Body mass index is 36.01 kg/m.      Intake/Output Summary (Last 24 hours) at 01/09/2019 1710  Last data filed at 01/09/2019 1315  Gross per 24 hour   Intake 654 ml   Output --   Net 654 ml    General: awake, alert ,in no acute distress  Cardiovascular: regular rate and rhythm, no murmurs, rubs or gallops  Lungs: clear to auscultation bilaterally, no additional sounds  Abdomen: soft, non-tender, non-distended; normoactive bowel sounds  Extremities: no edema  Neurological: Alert and oriented X 3, moves all extremities.   Skins/p Rt neck subcutaneous emphysema   improving   :         Wounds/decutibus ulcers/stage:    Consultations:   Treatment Team:   Attending Provider: Mahlon Gammon, Belva Crome, MD  Consulting Physician: Cathey Endow, MD  Recent Labs - Last 2:       Recent Labs   Lab 01/09/19  0509 01/08/19  0504 01/07/19  0504 01/06/19  0400 01/05/19  2213   WBC 4.33 4.37 5.63 6.41 6.68   Hgb 9.9* 9.5* 9.4* 9.9* 10.0*   Hematocrit 31.5* 30.8* 29.6* 31.7* 32.1*   Platelets 212 207 233 290 221      Recent Labs   Lab 01/05/19  2213   PT 12.6   PT INR 1.0   PTT 22*      Recent Labs   Lab 01/09/19  0750 01/08/19  1123 01/06/19  0400 01/05/19  2213   Sodium 142 138 140 139   Potassium 4.5 5.6* 4.2 4.2   Chloride 109 108 103 103   CO2 21* 22 25 25    BUN 7 7 6* 7   Creatinine 0.8 0.8 0.8 0.9   EGFR >60.0 >60.0 >60.0 >60.0    Glucose 92 114* 99 107*   Calcium 8.8 8.8 9.1 9.4     Recent  Labs   Lab 01/06/19  0400 01/05/19  2213   Alkaline Phosphatase 80 82   Bilirubin, Total 0.2 0.1*   Protein, Total 6.5 6.7   Albumin 3.4* 3.4*   ALT 12 14   AST (SGOT) 16 18      Recent Labs   Lab 01/08/19  1123   TSH 0.32*     Recent Labs   Lab 01/06/19  0400   Cholesterol 130   Triglycerides 68   HDL 55   LDL Calculated 61        Microbiology Results     None          Discharge Instructions & Follow Up Plan for Patient:   Discharge Diet: Supervise For Meals Frequency: All meals  Diet regular  Activity/Weight Bearing Status: as tolerated   Patient was instructed to follow up with:     Follow-up Information     Malcolm Metro, MD .    Specialty:  Family Medicine  Contact information:  [address]  [place] [address]  [phone]                   Complete instructions and follow up are in the patient's After Visit Summary    Minutes spent coordinating discharge and reviewing discharge plan: 45  minutes    Discharge Medications:        Medication List      START taking these medications    amLODIPine 2.5 MG tablet  Commonly known as:  NORVASC  Take 1 tablet (2.5 mg total) by mouth daily  Start taking on:  January 10, 2019     apixaban 5 MG  Commonly known as:  ELIQUIS  Take 1 tablet (5 mg total) by mouth every 12 (twelve) hours     clindamycin 300 MG capsule  Commonly known as:  CLEOCIN  Take 1 capsule (300 mg total) by mouth 3 (three) times daily for 3 days     lactobacillus/streptococcus Caps  Take 1 capsule by mouth daily        CONTINUE taking these medications    acetaminophen 325 MG tablet  Commonly known as:  TYLENOL  Take 2 tablets (650 mg total) by mouth every 4 (four) hours as needed for Pain     atorvastatin 40 MG tablet  Commonly known as:  LIPITOR     butalbital-acetaminophen-caffeine 50-325-40 MG per tablet  Commonly known as:  FIORICET, ESGIC  Take 1 tablet by mouth every 6 (six) hours as needed for Headaches     furosemide  20 MG tablet  Commonly known as:  LASIX  Take 1 tablet (20 mg total) by mouth daily     lamoTRIgine 150 MG tablet  Commonly known as:  LaMICtal  Take 1 tablet (150 mg total) by mouth 2 (two) times daily     levETIRAcetam 500 MG tablet  Commonly known as:  KEPPRA     metoprolol succinate XL 25 MG 24 hr tablet  Commonly known as:  TOPROL-XL     pantoprazole 40 MG tablet  Commonly known as:  PROTONIX  Take 1 tablet (40 mg total) by mouth daily     psyllium 58.12 % Pack packet  Commonly known as:  METAMUCIL  Take 1 packet by mouth daily     QUEtiapine 50 MG tablet  Commonly known as:  SEROquel  Take 1 tablet (50 mg total) by mouth nightly     ranolazine 500 MG 12  hr tablet  Commonly known as:  RANEXA     rOPINIRole 1 MG tablet  Commonly known as:  REQUIP  Take 1 tablet (1 mg total) by mouth nightly           Where to Get Your Medications      You can get these medications from any pharmacy    Bring a paper prescription for each of these medications   amLODIPine 2.5 MG tablet   apixaban 5 MG   clindamycin 300 MG capsule   lactobacillus/streptococcus Caps         Immunizations provided:   There is no immunization history on file for this patient.        Signed by: Yates Decamp, MD  [facility]  Department of Medicine  CC: Malcolm Metro, MD

## 2019-01-09 NOTE — Progress Notes (Signed)
PCP NOTIFICATION  Patient was asked if patient wanted to have his/her PCP notified. Patient stated yes. CM called PCP and notified the physician that the patient is now hospitalized.

## 2019-02-18 MED ORDER — APIXABAN 5 MG PO TABS
5.00 mg | ORAL_TABLET | ORAL | Status: DC
Start: 2019-02-18 — End: 2019-02-18

## 2019-02-18 MED ORDER — OMEPRAZOLE 20 MG PO CPDR
40.00 mg | DELAYED_RELEASE_CAPSULE | ORAL | Status: DC
Start: 2019-02-19 — End: 2019-02-18

## 2019-02-18 MED ORDER — GENERIC EXTERNAL MEDICATION
Status: DC
Start: ? — End: 2019-02-18

## 2019-02-18 MED ORDER — GENERIC EXTERNAL MEDICATION
1.00 mg | Status: DC
Start: ? — End: 2019-02-18

## 2019-02-18 MED ORDER — LURASIDONE HCL 40 MG PO TABS
40.00 mg | ORAL_TABLET | ORAL | Status: DC
Start: 2019-02-19 — End: 2019-02-18

## 2019-02-18 MED ORDER — TOPIRAMATE 25 MG PO TABS
25.00 mg | ORAL_TABLET | ORAL | Status: DC
Start: 2019-02-18 — End: 2019-02-18

## 2019-02-18 MED ORDER — DIPHENHYDRAMINE HCL 25 MG PO CAPS
25.00 mg | ORAL_CAPSULE | ORAL | Status: DC
Start: ? — End: 2019-02-18

## 2019-02-18 MED ORDER — QUETIAPINE FUMARATE 25 MG PO TABS
50.00 mg | ORAL_TABLET | ORAL | Status: DC
Start: 2019-02-19 — End: 2019-02-18

## 2019-02-18 MED ORDER — LAMOTRIGINE 100 MG PO TABS
100.00 mg | ORAL_TABLET | ORAL | Status: DC
Start: 2019-02-18 — End: 2019-02-18

## 2019-02-18 MED ORDER — PROMETHAZINE HCL 25 MG/ML IJ SOLN
6.25 mg | INTRAMUSCULAR | Status: DC
Start: ? — End: 2019-02-18

## 2019-02-18 MED ORDER — ATORVASTATIN CALCIUM 40 MG PO TABS
40.00 mg | ORAL_TABLET | ORAL | Status: DC
Start: 2019-02-19 — End: 2019-02-18

## 2019-02-18 MED ORDER — INSULIN LISPRO 100 UNIT/ML SC SOLN
2.00 [IU] | SUBCUTANEOUS | Status: DC
Start: 2019-02-18 — End: 2019-02-18

## 2019-02-18 MED ORDER — ATROPINE SULFATE 0.5 MG/5ML IJ SOSY
0.50 mg | PREFILLED_SYRINGE | INTRAMUSCULAR | Status: DC
Start: ? — End: 2019-02-18

## 2019-02-18 MED ORDER — LEVETIRACETAM 500 MG PO TABS
500.00 mg | ORAL_TABLET | ORAL | Status: DC
Start: 2019-02-18 — End: 2019-02-18

## 2019-02-18 MED ORDER — HYDROXYZINE HCL 25 MG PO TABS
25.00 mg | ORAL_TABLET | ORAL | Status: DC
Start: ? — End: 2019-02-18

## 2019-05-11 ENCOUNTER — Observation Stay
Admission: EM | Admit: 2019-05-11 | Discharge: 2019-05-13 | Disposition: A | Discharge status: Home / Self Care | Payer: Medicare Other | Attending: Internal Medicine | Admitting: Internal Medicine

## 2019-05-11 ENCOUNTER — Emergency Department: Payer: Medicare Other

## 2019-05-11 DIAGNOSIS — I480 Paroxysmal atrial fibrillation: Secondary | ICD-10-CM | POA: Insufficient documentation

## 2019-05-11 DIAGNOSIS — R079 Chest pain, unspecified: Secondary | ICD-10-CM | POA: Diagnosis present

## 2019-05-11 DIAGNOSIS — D649 Anemia, unspecified: Secondary | ICD-10-CM | POA: Insufficient documentation

## 2019-05-11 DIAGNOSIS — E785 Hyperlipidemia, unspecified: Secondary | ICD-10-CM | POA: Insufficient documentation

## 2019-05-11 DIAGNOSIS — I259 Chronic ischemic heart disease, unspecified: Secondary | ICD-10-CM

## 2019-05-11 DIAGNOSIS — I10 Essential (primary) hypertension: Secondary | ICD-10-CM | POA: Insufficient documentation

## 2019-05-11 DIAGNOSIS — J449 Chronic obstructive pulmonary disease, unspecified: Secondary | ICD-10-CM | POA: Insufficient documentation

## 2019-05-11 DIAGNOSIS — I208 Other forms of angina pectoris: Secondary | ICD-10-CM

## 2019-05-11 DIAGNOSIS — Z8673 Personal history of transient ischemic attack (TIA), and cerebral infarction without residual deficits: Secondary | ICD-10-CM | POA: Insufficient documentation

## 2019-05-11 DIAGNOSIS — E119 Type 2 diabetes mellitus without complications: Secondary | ICD-10-CM | POA: Insufficient documentation

## 2019-05-11 DIAGNOSIS — Z7901 Long term (current) use of anticoagulants: Secondary | ICD-10-CM | POA: Insufficient documentation

## 2019-05-11 DIAGNOSIS — R0789 Other chest pain: Principal | ICD-10-CM | POA: Insufficient documentation

## 2019-05-11 HISTORY — DX: Chronic obstructive pulmonary disease, unspecified: J44.9

## 2019-05-11 HISTORY — DX: Unspecified atrial fibrillation: I48.91

## 2019-05-11 HISTORY — DX: Anemia, unspecified: D64.9

## 2019-05-11 HISTORY — DX: Depression, unspecified: F32.A

## 2019-05-11 LAB — COMPREHENSIVE METABOLIC PANEL
ALT: 13 U/L (ref 0–55)
AST (SGOT): 14 U/L (ref 5–34)
Albumin/Globulin Ratio: 1.3 (ref 0.9–2.2)
Albumin: 3.4 g/dL — ABNORMAL LOW (ref 3.5–5.0)
Alkaline Phosphatase: 70 U/L (ref 37–106)
Anion Gap: 9 (ref 5.0–15.0)
BUN: 8 mg/dL (ref 7.0–19.0)
Bilirubin, Total: 0.3 mg/dL (ref 0.2–1.2)
CO2: 21 mEq/L — ABNORMAL LOW (ref 22–29)
Calcium: 8.4 mg/dL — ABNORMAL LOW (ref 8.5–10.5)
Chloride: 113 mEq/L — ABNORMAL HIGH (ref 100–111)
Creatinine: 0.9 mg/dL (ref 0.6–1.0)
Globulin: 2.6 g/dL (ref 2.0–3.6)
Glucose: 127 mg/dL — ABNORMAL HIGH (ref 70–100)
Potassium: 3.5 mEq/L (ref 3.5–5.1)
Protein, Total: 6 g/dL (ref 6.0–8.3)
Sodium: 143 mEq/L (ref 136–145)

## 2019-05-11 LAB — ECG 12-LEAD
Atrial Rate: 74 {beats}/min
P Axis: 29 degrees
P-R Interval: 156 ms
Q-T Interval: 414 ms
QRS Duration: 84 ms
QTC Calculation (Bezet): 459 ms
R Axis: 18 degrees
T Axis: 27 degrees
Ventricular Rate: 74 {beats}/min

## 2019-05-11 LAB — CBC AND DIFFERENTIAL
Absolute NRBC: 0 10*3/uL (ref 0.00–0.00)
Basophils Absolute Automated: 0.04 10*3/uL (ref 0.00–0.08)
Basophils Automated: 0.6 %
Eosinophils Absolute Automated: 0.34 10*3/uL (ref 0.00–0.44)
Eosinophils Automated: 5.3 %
Hematocrit: 31 % — ABNORMAL LOW (ref 34.7–43.7)
Hgb: 10 g/dL — ABNORMAL LOW (ref 11.4–14.8)
Immature Granulocytes Absolute: 0.04 10*3/uL (ref 0.00–0.07)
Immature Granulocytes: 0.6 %
Lymphocytes Absolute Automated: 1.66 10*3/uL (ref 0.42–3.22)
Lymphocytes Automated: 26.1 %
MCH: 28 pg (ref 25.1–33.5)
MCHC: 32.3 g/dL (ref 31.5–35.8)
MCV: 86.8 fL (ref 78.0–96.0)
MPV: 9.1 fL (ref 8.9–12.5)
Monocytes Absolute Automated: 0.53 10*3/uL (ref 0.21–0.85)
Monocytes: 8.3 %
Neutrophils Absolute: 3.75 10*3/uL (ref 1.10–6.33)
Neutrophils: 59.1 %
Nucleated RBC: 0 /100 WBC (ref 0.0–0.0)
Platelets: 240 10*3/uL (ref 142–346)
RBC: 3.57 10*6/uL — ABNORMAL LOW (ref 3.90–5.10)
RDW: 14 % (ref 11–15)
WBC: 6.36 10*3/uL (ref 3.10–9.50)

## 2019-05-11 LAB — PT AND APTT
PT INR: 1.2 — ABNORMAL HIGH (ref 0.9–1.1)
PT: 15.3 s — ABNORMAL HIGH (ref 12.6–15.0)
PTT: 49 s — ABNORMAL HIGH (ref 23–37)

## 2019-05-11 LAB — TROPONIN I
Troponin I: 0.01 ng/mL (ref 0.00–0.05)
Troponin I: 0.02 ng/mL (ref 0.00–0.05)

## 2019-05-11 LAB — GLUCOSE WHOLE BLOOD - POCT
Whole Blood Glucose POCT: 87 mg/dL (ref 70–100)
Whole Blood Glucose POCT: 99 mg/dL (ref 70–100)

## 2019-05-11 LAB — GFR: EGFR: >60

## 2019-05-11 MED ORDER — MORPHINE SULFATE 4 MG/ML IJ/IV SOLN (WRAP)
4.0000 mg | Freq: Once | Status: AC
Start: 2019-05-11 — End: 2019-05-11
  Administered 2019-05-11: 4 mg via INTRAVENOUS
  Filled 2019-05-11: qty 1

## 2019-05-11 MED ORDER — LEVETIRACETAM 500 MG PO TABS
500.00 mg | ORAL_TABLET | ORAL | Status: DC
Start: 2019-05-08 — End: 2019-05-11

## 2019-05-11 MED ORDER — ATORVASTATIN CALCIUM 40 MG PO TABS
40.00 mg | ORAL_TABLET | ORAL | Status: DC
Start: 2019-05-08 — End: 2019-05-11

## 2019-05-11 MED ORDER — DIPHENHYDRAMINE HCL 50 MG/ML IJ SOLN
25.00 mg | Freq: Four times a day (QID) | INTRAMUSCULAR | Status: DC | PRN
Start: 2019-05-11 — End: 2019-05-13
  Administered 2019-05-11 – 2019-05-13 (×8): 25 mg via INTRAVENOUS
  Filled 2019-05-11 (×8): qty 1

## 2019-05-11 MED ORDER — LAMOTRIGINE 100 MG PO TABS
150.0000 mg | ORAL_TABLET | Freq: Two times a day (BID) | ORAL | Status: DC
Start: 2019-05-11 — End: 2019-05-13
  Administered 2019-05-11 – 2019-05-13 (×4): 150 mg via ORAL
  Filled 2019-05-11 (×4): qty 2

## 2019-05-11 MED ORDER — PANTOPRAZOLE SODIUM 40 MG PO TBEC
40.00 mg | DELAYED_RELEASE_TABLET | Freq: Every morning | ORAL | Status: DC
Start: 2019-05-12 — End: 2019-05-13
  Administered 2019-05-12 – 2019-05-13 (×2): 40 mg via ORAL
  Filled 2019-05-11 (×2): qty 1

## 2019-05-11 MED ORDER — ROPINIROLE HCL 0.25 MG PO TABS
1.0000 mg | ORAL_TABLET | Freq: Every evening | ORAL | Status: DC
Start: 2019-05-11 — End: 2019-05-13
  Administered 2019-05-11 – 2019-05-12 (×2): 1 mg via ORAL
  Filled 2019-05-11 (×2): qty 4

## 2019-05-11 MED ORDER — DIPHENHYDRAMINE HCL 50 MG/ML IJ SOLN
25.00 mg | Freq: Once | INTRAMUSCULAR | Status: AC
Start: 2019-05-11 — End: 2019-05-11
  Administered 2019-05-11: 25 mg via INTRAVENOUS
  Filled 2019-05-11: qty 1

## 2019-05-11 MED ORDER — METOPROLOL SUCCINATE ER 25 MG PO TB24
25.00 mg | ORAL_TABLET | ORAL | Status: DC
Start: 2019-05-09 — End: 2019-05-11

## 2019-05-11 MED ORDER — GLUCOSE 40 % PO GEL
15.00 g | ORAL | Status: DC | PRN
Start: 2019-05-11 — End: 2019-05-13

## 2019-05-11 MED ORDER — LEVETIRACETAM IN NACL 500 MG/100ML IV SOLN
500.00 mg | INTRAVENOUS | Status: DC
Start: 2019-05-08 — End: 2019-05-11

## 2019-05-11 MED ORDER — RANOLAZINE ER 500 MG PO TB12
1000.00 mg | ORAL_TABLET | ORAL | Status: DC
Start: 2019-05-08 — End: 2019-05-11

## 2019-05-11 MED ORDER — ACETAMINOPHEN 325 MG PO TABS
650.00 mg | ORAL_TABLET | ORAL | Status: DC
Start: ? — End: 2019-05-11

## 2019-05-11 MED ORDER — METOPROLOL SUCCINATE ER 25 MG PO TB24
25.00 mg | ORAL_TABLET | Freq: Every day | ORAL | Status: DC
Start: 2019-05-11 — End: 2019-05-13
  Administered 2019-05-11 – 2019-05-13 (×3): 25 mg via ORAL
  Filled 2019-05-11 (×3): qty 1

## 2019-05-11 MED ORDER — DEXTROSE 50 % IV SOLN
12.50 g | INTRAVENOUS | Status: DC | PRN
Start: 2019-05-11 — End: 2019-05-13

## 2019-05-11 MED ORDER — MORPHINE SULFATE 2 MG/ML IJ/IV SOLN (WRAP)
2.0000 mg | Status: DC | PRN
Start: 2019-05-11 — End: 2019-05-12
  Administered 2019-05-11 – 2019-05-12 (×5): 2 mg via INTRAVENOUS
  Filled 2019-05-11 (×5): qty 1

## 2019-05-11 MED ORDER — ATORVASTATIN CALCIUM 40 MG PO TABS
40.0000 mg | ORAL_TABLET | Freq: Every evening | ORAL | Status: DC
Start: 2019-05-11 — End: 2019-05-13
  Administered 2019-05-11 – 2019-05-12 (×2): 40 mg via ORAL
  Filled 2019-05-11 (×2): qty 1

## 2019-05-11 MED ORDER — TOPIRAMATE 25 MG PO TABS
25.00 mg | ORAL_TABLET | ORAL | Status: DC
Start: 2019-05-08 — End: 2019-05-11

## 2019-05-11 MED ORDER — OMEPRAZOLE 20 MG PO CPDR
40.00 mg | DELAYED_RELEASE_CAPSULE | ORAL | Status: DC
Start: 2019-05-09 — End: 2019-05-11

## 2019-05-11 MED ORDER — LURASIDONE HCL 40 MG PO TABS
40.00 mg | ORAL_TABLET | ORAL | Status: DC
Start: 2019-05-08 — End: 2019-05-11

## 2019-05-11 MED ORDER — PSYLLIUM 58.12 % PO PACK
1.00 | PACK | Freq: Every day | ORAL | Status: DC
Start: 2019-05-11 — End: 2019-05-13
  Administered 2019-05-13: 1 via ORAL
  Filled 2019-05-11 (×2): qty 1

## 2019-05-11 MED ORDER — APIXABAN 5 MG PO TABS
5.0000 mg | ORAL_TABLET | Freq: Two times a day (BID) | ORAL | Status: DC
Start: 2019-05-11 — End: 2019-05-13
  Administered 2019-05-11 – 2019-05-13 (×4): 5 mg via ORAL
  Filled 2019-05-11 (×4): qty 1

## 2019-05-11 MED ORDER — NALOXONE HCL 0.4 MG/ML IJ SOLN (WRAP)
0.20 mg | INTRAMUSCULAR | Status: DC | PRN
Start: 2019-05-11 — End: 2019-05-13

## 2019-05-11 MED ORDER — FLUTICASONE FUROATE-VILANTEROL 200-25 MCG/INH IN AEPB
1.00 | INHALATION_SPRAY | RESPIRATORY_TRACT | Status: DC
Start: 2019-05-09 — End: 2019-05-11

## 2019-05-11 MED ORDER — ROPINIROLE HCL 0.25 MG PO TABS
0.50 mg | ORAL_TABLET | ORAL | Status: DC
Start: 2019-05-08 — End: 2019-05-11

## 2019-05-11 MED ORDER — LAMOTRIGINE 100 MG PO TABS
100.00 mg | ORAL_TABLET | ORAL | Status: DC
Start: 2019-05-08 — End: 2019-05-11

## 2019-05-11 MED ORDER — QUETIAPINE FUMARATE 25 MG PO TABS
50.00 mg | ORAL_TABLET | ORAL | Status: DC
Start: 2019-05-08 — End: 2019-05-11

## 2019-05-11 MED ORDER — GLUCAGON 1 MG IJ SOLR (WRAP)
1.00 mg | INTRAMUSCULAR | Status: DC | PRN
Start: 2019-05-11 — End: 2019-05-13

## 2019-05-11 MED ORDER — ALBUTEROL SULFATE (2.5 MG/3ML) 0.083% IN NEBU
2.50 mg | INHALATION_SOLUTION | RESPIRATORY_TRACT | Status: DC
Start: ? — End: 2019-05-11

## 2019-05-11 MED ORDER — RANOLAZINE ER 500 MG PO TB12
500.00 mg | ORAL_TABLET | Freq: Two times a day (BID) | ORAL | Status: DC
Start: 2019-05-11 — End: 2019-05-12
  Administered 2019-05-11 – 2019-05-12 (×2): 500 mg via ORAL
  Filled 2019-05-11 (×2): qty 1

## 2019-05-11 MED ORDER — ACETAMINOPHEN 325 MG PO TABS
650.0000 mg | ORAL_TABLET | ORAL | Status: DC | PRN
Start: 2019-05-11 — End: 2019-05-13
  Administered 2019-05-12 – 2019-05-13 (×2): 650 mg via ORAL
  Filled 2019-05-11 (×2): qty 2

## 2019-05-11 MED ORDER — FUROSEMIDE 20 MG PO TABS
20.0000 mg | ORAL_TABLET | Freq: Every day | ORAL | Status: DC
Start: 2019-05-12 — End: 2019-05-13
  Administered 2019-05-12 – 2019-05-13 (×2): 20 mg via ORAL
  Filled 2019-05-11 (×2): qty 1

## 2019-05-11 MED ORDER — FUROSEMIDE 20 MG PO TABS
20.00 mg | ORAL_TABLET | ORAL | Status: DC
Start: 2019-05-09 — End: 2019-05-11

## 2019-05-11 MED ORDER — QUETIAPINE FUMARATE 25 MG PO TABS
50.0000 mg | ORAL_TABLET | Freq: Every evening | ORAL | Status: DC
Start: 2019-05-11 — End: 2019-05-13
  Administered 2019-05-11 – 2019-05-12 (×2): 50 mg via ORAL
  Filled 2019-05-11 (×2): qty 2

## 2019-05-11 MED ORDER — LEVETIRACETAM 500 MG PO TABS
500.0000 mg | ORAL_TABLET | Freq: Two times a day (BID) | ORAL | Status: DC
Start: 2019-05-11 — End: 2019-05-13
  Administered 2019-05-11 – 2019-05-13 (×4): 500 mg via ORAL
  Filled 2019-05-11 (×4): qty 1

## 2019-05-11 MED ORDER — RISAQUAD PO CAPS
1.00 | ORAL_CAPSULE | Freq: Every day | ORAL | Status: DC
Start: 2019-05-12 — End: 2019-05-13
  Administered 2019-05-12 – 2019-05-13 (×2): 1 via ORAL
  Filled 2019-05-11 (×2): qty 1

## 2019-05-11 MED ORDER — INSULIN LISPRO 100 UNIT/ML SC SOLN
1.00 [IU] | Freq: Three times a day (TID) | SUBCUTANEOUS | Status: DC
Start: 2019-05-11 — End: 2019-05-13

## 2019-05-11 MED ORDER — AMLODIPINE BESYLATE 5 MG PO TABS
2.5000 mg | ORAL_TABLET | Freq: Every day | ORAL | Status: DC
Start: 2019-05-12 — End: 2019-05-13
  Administered 2019-05-12 – 2019-05-13 (×2): 2.5 mg via ORAL
  Filled 2019-05-11 (×2): qty 1

## 2019-05-11 MED ORDER — GENERIC EXTERNAL MEDICATION
Status: DC
Start: ? — End: 2019-05-11

## 2019-05-11 MED ORDER — GABAPENTIN 300 MG PO CAPS
300.00 mg | ORAL_CAPSULE | ORAL | Status: DC
Start: 2019-05-08 — End: 2019-05-11

## 2019-05-11 MED ORDER — MORPHINE SULFATE 2 MG/ML IJ/IV SOLN (WRAP)
1.0000 mg | Freq: Once | Status: AC
Start: 2019-05-11 — End: 2019-05-11
  Administered 2019-05-11: 1 mg via INTRAVENOUS
  Filled 2019-05-11: qty 1

## 2019-05-11 MED ORDER — APIXABAN 5 MG PO TABS
5.00 mg | ORAL_TABLET | ORAL | Status: DC
Start: 2019-05-08 — End: 2019-05-11

## 2019-05-11 MED ORDER — ONDANSETRON HCL 4 MG/2ML IJ SOLN
4.00 mg | Freq: Once | INTRAMUSCULAR | Status: AC
Start: 2019-05-11 — End: 2019-05-11
  Administered 2019-05-11: 4 mg via INTRAVENOUS
  Filled 2019-05-11: qty 2

## 2019-05-11 NOTE — ED Provider Notes (Addendum)
EMERGENCY DEPARTMENT NOTE       HISTORY OF PRESENT ILLNESS   Historian:Patient  Translator Used: No    Chief Complaint: Chest Pain         63 y.o. female  With hx of dm, htn,  Paroxysmal atrial fibrillation on Eliquis, copd, bipolar disorder, Presents to the ed with chest pain that started 1.5 hours pta. States it woke her from her sleep. Pt c/o numbness and tingling of left arm.  Denies fever, cough, or sob. Denies n/v/d. Pt states she took her medications this am.  Pt states she was discharged from [facility] hospital 2 days ago after being admitted for similar symptoms.  Denies weakness of extremities.     1. Location of symptoms: chest pain   2. Onset of symptoms: 1.5 hours pta   3. What was patient doing when symptoms started (Context): see above  4. Severity: moderate  5. Timing: woke up with symptoms   6. Activities that worsen symptoms: nothing   7. Activities that improve symptoms: nothing   8. Quality: pressure   9. Radiation of symptoms: left arm tingling   10. Associated signs and Symptoms: see above  11. Are symptoms worsening? yes  MEDICAL HISTORY     Past Medical History:  Past Medical History:   Diagnosis Date   . Anemia    . Asthma    . Atrial fibrillation    . Chronic obstructive pulmonary disease    . Convulsions    . Depression    . Diabetes mellitus     diet controlled    . Hyperlipidemia    . Hypertension    . TIA (transient ischemic attack)        Past Surgical History:  Past Surgical History:   Procedure Laterality Date   . HYSTERECTOMY     . pci         Social History:  Social History     Socioeconomic History   . Marital status: Divorced     Spouse name: Not on file   . Number of children: Not on file   . Years of education: Not on file   . Highest education level: Not on file   Occupational History   . Not on file   Social Needs   . Financial resource strain: Not on file   . Food insecurity     Worry: Not on file     Inability: Not on file   . Transportation needs     Medical: Not on file      Non-medical: Not on file   Tobacco Use   . Smoking status: Never Smoker   . Smokeless tobacco: Never Used   Substance and Sexual Activity   . Alcohol use: Never     Frequency: Never   . Drug use: Never   . Sexual activity: Not on file   Lifestyle   . Physical activity     Days per week: Not on file     Minutes per session: Not on file   . Stress: Not on file   Relationships   . Social Wellsite geologist on phone: Not on file     Gets together: Not on file     Attends religious service: Not on file     Active member of club or organization: Not on file     Attends meetings of clubs or organizations: Not on file     Relationship status: Not on  file   . Intimate partner violence     Fear of current or ex partner: Not on file     Emotionally abused: Not on file     Physically abused: Not on file     Forced sexual activity: Not on file   Other Topics Concern   . Not on file   Social History Narrative   . Not on file       Family History:  History reviewed. No pertinent family history.    Outpatient Medication:  Previous Medications    ACETAMINOPHEN (TYLENOL) 325 MG TABLET    Take 2 tablets (650 mg total) by mouth every 4 (four) hours as needed for Pain    AMLODIPINE (NORVASC) 2.5 MG TABLET    Take 1 tablet (2.5 mg total) by mouth daily    APIXABAN (ELIQUIS) 5 MG    Take 1 tablet (5 mg total) by mouth every 12 (twelve) hours    ATORVASTATIN (LIPITOR) 40 MG TABLET    Take 40 mg by mouth daily    FUROSEMIDE (LASIX) 20 MG TABLET    Take 1 tablet (20 mg total) by mouth daily    LACTOBACILLUS/STREPTOCOCCUS (RISAQUAD) CAP    Take 1 capsule by mouth daily    LAMOTRIGINE (LAMICTAL) 150 MG TABLET    Take 1 tablet (150 mg total) by mouth 2 (two) times daily    LEVETIRACETAM (KEPPRA) 500 MG TABLET    Take 500 mg by mouth 2 (two) times daily    METOPROLOL SUCCINATE XL (TOPROL-XL) 25 MG 24 HR TABLET    Take 25 mg by mouth daily    PANTOPRAZOLE (PROTONIX) 40 MG TABLET    Take 1 tablet (40 mg total) by mouth daily    PSYLLIUM  (METAMUCIL) 58.12 % PACK PACKET    Take 1 packet by mouth daily    QUETIAPINE (SEROQUEL) 50 MG TABLET    Take 1 tablet (50 mg total) by mouth nightly    RANOLAZINE (RANEXA) 500 MG 12 HR TABLET    Take 500 mg by mouth 2 (two) times daily    ROPINIROLE (REQUIP) 1 MG TABLET    Take 1 tablet (1 mg total) by mouth nightly         REVIEW OF SYSTEMS   Review of Systems   Constitutional: Negative for chills and fever.   Respiratory: Negative for cough and shortness of breath.    Cardiovascular: Positive for chest pain.   Gastrointestinal: Negative for abdominal pain, nausea and vomiting.   Neurological: Positive for tingling (left arm ).   All other systems reviewed and are negative.           PHYSICAL EXAM     ED Triage Vitals [05/11/19 0705]   Enc Vitals Group      BP 123/69      Heart Rate (!) 105      Resp Rate 17      Temp 98.3 F (36.8 C)      Temp Source Oral      SpO2 98 %      Weight 93 kg      Height 1.651 m      Head Circumference       Peak Flow       Pain Score 8      Pain Loc       Pain Edu?       Excl. in GC?        Nursing note and vitals  reviewed.  Constitutional:  Well developed, well nourished. Awake & Oriented x3.  Head:  Atraumatic. Normocephalic.    Eyes:  PERRL. EOMI. Conjunctivae are not pale.  ENT:  Mucous membranes are moist and intact. Oropharynx is clear and symmetric.  Patent airway.  Neck:  Supple. Full ROM.    Cardiovascular:  Regular rate. Regular rhythm. No murmurs, rubs, or gallops.  Pulmonary/Chest:  No evidence of respiratory distress. Clear to auscultation bilaterally.  No wheezing, rales or rhonchi.   Abdominal:  Soft and non-distended. There is no tenderness. No rebound, guarding, or rigidity.  Back:  Full ROM. Nontender.  Extremities:  No edema. No cyanosis. No clubbing. Full range of motion in all extremities.  Skin:  Skin is warm and dry.  No diaphoresis. No rash.   Neurological:  Alert, awake, and appropriate. Normal speech. Motor normal.   Psychiatric:  Good eye contact. Normal  interaction, affect, and behavior.        MEDICAL DECISION MAKING     Pt given IV fluids for hydration  CXR to r/o pneumonia  Troponin to r/o ischemia      830 am case discussed with Dr Yolonda Kida cardiologist on call for Dr Aneta Mins     Will do repeat 3 hour troponin  Results and plan discussed with the patient who agrees with the plan.    Previous records reviewed  Pt had a negative nuclear stress test on 06/20/18  Pt had cardiac cath 2014 and 2017 which was negative for cad  Pt had echo on 07/09/18 which showed NML EF  And Moderate AR    Pt is being treated for microvascular angina    Pt has been talking to daughter on her phone in no distress    Differential diagnosis includes costochondritis, musculoskeletal chest pain, cardiac dysrhythmia, peptic ulcer disease, pleurisy,   Differential diagnosis also includes acs,  cardiac ischemia, PE, aortic dissection      Pt has new ekg changes - T wave inversions in V2 and V3, and troponin went from .01 to .02       Will admit to r/o cardiac ischemia   Results and plan discussed with pt   Pt states she prefers to go to [facility] hospital     Case discussed with Dr Deforest Hoyles- IMG on call who accepts pt for admission    Case discussed with DR Betti Cruz- cardiologist on call who agrees to be on consult    Pt agrees with transfer and is stable for transfer    At 1225 pt states chest pain is returning and radiating to neck.   Repeat ekg ordered       Pt had negative covid 19 test at [facility] on 05/02/19    DISCUSSION          The patient is NOT septic.  All labs and vital signs from the current visit have been reviewed and any abnormality that is present is not due to sepsis.    Vital Signs: Reviewed the patient's vital signs.   Nursing Notes: Reviewed and utilized available nursing notes.  Medical Records Reviewed: Reviewed available past medical records.  Counseling: The emergency provider has spoken with the patient and discussed today's findings, in addition to providing specific details for the  plan of care.  Questions are answered and there is agreement with the plan.      MIPS DOCUMENTATION      CARDIAC STUDIES    The following cardiac studies were independently interpreted by the  Emergency Medicine Physician.  For full cardiac study results please see chart.    Monitor Strip  Interpreted by ED Physician  Rate: 95  Rhythm: NSR   ST Changes: none    EKG Interpretation:  Signed and interpreted byED Physician   Time Interpreted: 0705  Comparison: 01/05/19  Rate: 103  Rhythm: sinus tachycardia  Axis: normal  Intervals: normal  Blocks: none  ST segments: nml  Interpretation: sinus tachycardia   EKG    Monitor Strip  Interpreted by ED Physician  Rate: 75  Rhythm: NSR   ST Changes: none    EKG Interpretation:  Signed and interpreted byED Physician   Time Interpreted: 1002  Comparison: 01/05/19, and 0705 this morning   Rate: 76  Rhythm:  NSR  Axis: left  Intervals: normal  Blocks: none  ST segments: t wave inversions in V2 and V3- New  Interpretation: NSR   EKG      Monitor Strip  Interpreted by ED Physician  Rate: 82  Rhythm: NSR   ST Changes: none    EKG Interpretation:  Signed and interpreted byED Physician   Time Interpreted: 1235  Comparison: 01/05/19, and 0705, 1002 this morning   Rate: 75  Rhythm:  NSR  Axis: left  Intervals: normal  Blocks: none  ST segments: t wave inversions in V2 and V3- New  Interpretation: NSR EKG      EMERGENCY IMAGING STUDIES    The following imagine studies were independently interpreted by me (emergency physician):    Radiology:  Interpreted by me (ED Physician)  Study: Chest Xray   Results: No infiltrate. No pneumothorax. No hemothorax. No cardiomegaly. No CHF.  Impression: No acute intrathoracic abnormality.      RADIOLOGY IMAGING STUDIES      Chest AP Portable   Final Result      No acute process.            No change from XR CHEST AP PORTABLE with report dated 01/06/2019 12:22   AM.      Laurena Slimmer, MD    05/11/2019 7:58 AM              PULSE OXIMETRY    Oxygen Saturation by  Pulse Oximetry: 99%  Interventions: none  Interpretation:  nml    EMERGENCY DEPT. MEDICATIONS      ED Medication Orders (From admission, onward)    Start Ordered     Status Ordering Provider    05/11/19 1252 05/11/19 1251  diphenhydrAMINE (BENADRYL) injection 25 mg  Once     Route: Intravenous  Ordered Dose: 25 mg     Last MAR action:  Given Loetta Rough    05/11/19 1228 05/11/19 1227  morphine injection 4 mg  Once     Route: Intravenous  Ordered Dose: 4 mg     Last MAR action:  Given Loetta Rough    05/11/19 0836 05/11/19 0835  morphine injection 4 mg  Once     Route: Intravenous  Ordered Dose: 4 mg     Last MAR action:  Given Loetta Rough    05/11/19 0725 05/11/19 0724  morphine injection 4 mg  Once     Route: Intravenous  Ordered Dose: 4 mg     Last MAR action:  Given Loetta Rough    05/11/19 0725 05/11/19 0724  ondansetron (ZOFRAN) injection 4 mg  Once     Route: Intravenous  Ordered Dose: 4 mg     Last  MAR action:  Given Elie Goody G          LABORATORY RESULTS    Ordered and independently interpreted AVAILABLE laboratory tests. Please see results section in chart for full details.  Results for orders placed or performed during the hospital encounter of 05/11/19   Comprehensive metabolic panel   Result Value Ref Range    Glucose 127 (H) 70 - 100 mg/dL    BUN 8.0 7.0 - 16.1 mg/dL    Creatinine 0.9 0.6 - 1.0 mg/dL    Sodium 096 045 - 409 mEq/L    Potassium 3.5 3.5 - 5.1 mEq/L    Chloride 113 (H) 100 - 111 mEq/L    CO2 21 (L) 22 - 29 mEq/L    Calcium 8.4 (L) 8.5 - 10.5 mg/dL    Protein, Total 6.0 6.0 - 8.3 g/dL    Albumin 3.4 (L) 3.5 - 5.0 g/dL    AST (SGOT) 14 5 - 34 U/L    ALT 13 0 - 55 U/L    Alkaline Phosphatase 70 37 - 106 U/L    Bilirubin, Total 0.3 0.2 - 1.2 mg/dL    Globulin 2.6 2.0 - 3.6 g/dL    Albumin/Globulin Ratio 1.3 0.9 - 2.2    Anion Gap 9.0 5.0 - 15.0   CBC and differential   Result Value Ref Range    WBC 6.36 3.10 - 9.50 x10 3/uL    Hgb 10.0 (L) 11.4 - 14.8 g/dL    Hematocrit 81.1  (L) 34.7 - 43.7 %    Platelets 240 142 - 346 x10 3/uL    RBC 3.57 (L) 3.90 - 5.10 x10 6/uL    MCV 86.8 78.0 - 96.0 fL    MCH 28.0 25.1 - 33.5 pg    MCHC 32.3 31.5 - 35.8 g/dL    RDW 14 11 - 15 %    MPV 9.1 8.9 - 12.5 fL    Neutrophils 59.1 None %    Lymphocytes Automated 26.1 None %    Monocytes 8.3 None %    Eosinophils Automated 5.3 None %    Basophils Automated 0.6 None %    Immature Granulocytes 0.6 None %    Nucleated RBC 0.0 0.0 - 0.0 /100 WBC    Neutrophils Absolute 3.75 1.10 - 6.33 x10 3/uL    Lymphocytes Absolute Automated 1.66 0.42 - 3.22 x10 3/uL    Monocytes Absolute Automated 0.53 0.21 - 0.85 x10 3/uL    Eosinophils Absolute Automated 0.34 0.00 - 0.44 x10 3/uL    Basophils Absolute Automated 0.04 0.00 - 0.08 x10 3/uL    Immature Granulocytes Absolute 0.04 0.00 - 0.07 x10 3/uL    Absolute NRBC 0.00 0.00 - 0.00 x10 3/uL   Troponin I   Result Value Ref Range    Troponin I 0.01 0.00 - 0.05 ng/mL   PT/APTT   Result Value Ref Range    PT 15.3 (H) 12.6 - 15.0 sec    PT INR 1.2 (H) 0.9 - 1.1    PTT 49 (H) 23 - 37 sec   GFR   Result Value Ref Range    EGFR >60.0    Troponin I   Result Value Ref Range    Troponin I 0.02 0.00 - 0.05 ng/mL   ECG 12 lead   Result Value Ref Range    Ventricular Rate 103 BPM    Atrial Rate 103 BPM    P-R Interval 168 ms    QRS  Duration 82 ms    Q-T Interval 350 ms    QTC Calculation (Bezet) 458 ms    P Axis 33 degrees    R Axis 16 degrees    T Axis -8 degrees   ECG 12 lead   Result Value Ref Range    Ventricular Rate 76 BPM    Atrial Rate 76 BPM    P-R Interval 124 ms    QRS Duration 72 ms    Q-T Interval 420 ms    QTC Calculation (Bezet) 472 ms    P Axis 27 degrees    R Axis 7 degrees    T Axis 25 degrees   Repeat-ECG 12 Lead   Result Value Ref Range    Ventricular Rate 75 BPM    Atrial Rate 75 BPM    P-R Interval 128 ms    QRS Duration 86 ms    Q-T Interval 430 ms    QTC Calculation (Bezet) 480 ms    P Axis 28 degrees    R Axis 40 degrees    T Axis 18 degrees   ECG 12 lead   Result  Value Ref Range    Ventricular Rate 74 BPM    Atrial Rate 74 BPM    P-R Interval 156 ms    QRS Duration 84 ms    Q-T Interval 414 ms    QTC Calculation (Bezet) 459 ms    P Axis 29 degrees    R Axis 18 degrees    T Axis 27 degrees       CRITICAL CARE/PROCEDURES    Critical Care  Performed by: Loetta Rough, DO  Authorized by: Loetta Rough, DO     Critical care provider statement:     Critical care time (minutes):  35    Critical care start time:  05/11/2019 7:19 AM    Critical care end time:  05/11/2019 12:28 PM    Critical care time was exclusive of:  Separately billable procedures and treating other patients    Critical care was necessary to treat or prevent imminent or life-threatening deterioration of the following conditions:  Respiratory failure, cardiac failure and circulatory failure    Critical care was time spent personally by me on the following activities:  Ordering and performing treatments and interventions, ordering and review of laboratory studies, ordering and review of radiographic studies, pulse oximetry, re-evaluation of patient's condition, review of old charts, obtaining history from patient or surrogate, development of treatment plan with patient or surrogate, discussions with consultants, discussions with primary provider, evaluation of patient's response to treatment and examination of patient    I assumed direction of critical care for this patient from another provider in my specialty: no            DIAGNOSIS      Diagnosis:  Final diagnoses:   Chest pain with moderate risk for cardiac etiology   Microvascular angina   Cardiac ischemia       Disposition:  ED Disposition     ED Disposition Condition Date/Time Comment    Transfer to Another Facility  Sat May 11, 2019  1:23 PM Admitting Physician: Deforest Hoyles, ATEFEH [[id number]]   Diagnosis: Chest pain with moderate risk for cardiac etiology [[id number]]   Estimated Length of Stay: < 2 midnights   Tentative Discharge Plan?: Home or Self Care [1]   Patient  Class: Observation [104]            Prescriptions:  Patient's  Medications   New Prescriptions    No medications on file   Previous Medications    ACETAMINOPHEN (TYLENOL) 325 MG TABLET    Take 2 tablets (650 mg total) by mouth every 4 (four) hours as needed for Pain    AMLODIPINE (NORVASC) 2.5 MG TABLET    Take 1 tablet (2.5 mg total) by mouth daily    APIXABAN (ELIQUIS) 5 MG    Take 1 tablet (5 mg total) by mouth every 12 (twelve) hours    ATORVASTATIN (LIPITOR) 40 MG TABLET    Take 40 mg by mouth daily    FUROSEMIDE (LASIX) 20 MG TABLET    Take 1 tablet (20 mg total) by mouth daily    LACTOBACILLUS/STREPTOCOCCUS (RISAQUAD) CAP    Take 1 capsule by mouth daily    LAMOTRIGINE (LAMICTAL) 150 MG TABLET    Take 1 tablet (150 mg total) by mouth 2 (two) times daily    LEVETIRACETAM (KEPPRA) 500 MG TABLET    Take 500 mg by mouth 2 (two) times daily    METOPROLOL SUCCINATE XL (TOPROL-XL) 25 MG 24 HR TABLET    Take 25 mg by mouth daily    PANTOPRAZOLE (PROTONIX) 40 MG TABLET    Take 1 tablet (40 mg total) by mouth daily    PSYLLIUM (METAMUCIL) 58.12 % PACK PACKET    Take 1 packet by mouth daily    QUETIAPINE (SEROQUEL) 50 MG TABLET    Take 1 tablet (50 mg total) by mouth nightly    RANOLAZINE (RANEXA) 500 MG 12 HR TABLET    Take 500 mg by mouth 2 (two) times daily    ROPINIROLE (REQUIP) 1 MG TABLET    Take 1 tablet (1 mg total) by mouth nightly   Modified Medications    No medications on file   Discontinued Medications    BUTALBITAL-ACETAMINOPHEN-CAFFEINE (FIORICET, ESGIC) 50-325-40 MG PER TABLET    Take 1 tablet by mouth every 6 (six) hours as needed for Headaches        Loetta Rough, DO  05/11/19 1128       Loetta Rough, DO  05/11/19 1402

## 2019-05-11 NOTE — Progress Notes (Signed)
On assessment pt report decrease in chest pain. Admission completed. Plan of care is review with pt. Will continue to monitor pt and assess pt for chest pain.

## 2019-05-11 NOTE — ED Notes (Signed)
Pt C/O mild itchiness to IV site. Minimal discomfort. MD notified and to monitor.

## 2019-05-11 NOTE — Progress Notes (Addendum)
Report taken from [place], RN at [facility] ED. Upon arrival to unit, pt complaining of mid chest pain radiating to the back. Dr. Deforest Hoyles aware, STAT EKG and 1 mg IV morphine + IV 25 mg IV Benadryl given with good effect. Will continue monitoring, stable vital signs. [place], RN taken over patient care, report given in detail, all questions were answered.

## 2019-05-11 NOTE — EDIE (Signed)
COLLECTIVE?NOTIFICATION?05/11/2019 06:54?Noren, Helia?MRN: [id number]    Criteria Met      5 ED Visits in 12 Months    Security and Safety  No recent Security Events currently on file    ED Care Guidelines  There are currently no ED Care Guidelines for this patient. Please check your facility's medical records system.    Flags      Negative COVID-19 Lab Result - [facility] - A specimen collected from this patient was negative for COVID-19 / Attributed By: [place] Department of Health / Attributed On: 05/06/2019           E.D. Visit Count (12 mo.)  Facility Visits   [facility] [address] - [facility] 1   [facility] 5   [facility] 1   [place] [facility]: [facility] 1   [facility] 2   Total 54   Note: Visits indicate total known visits.      Recent Emergency Department Visit Summary  Showing 10 most recent visits out of 54 in the past 12 months  Date Facility [facility] Type Diagnoses or Chief Complaint   May 11, 2019 [place] [facility]: [facility] [place] [facility]      CP      May 05, 2019 [facility] [name] [place] [facility]      DEMENTIA SYMPTOMS      ALTERED MENTAL STATUS      Weakness      Apr 28, 2019 [facility] [name] [place] [facility]      ASTHMA ATTACK      ASTHMA      Unspecified asthma with (acute) exacerbation      1. Cough      2. Shortness of breath      5. Chest pain, unspecified      6. Essential (primary) hypertension      7. Paroxysmal atrial fibrillation      8. Type 2 diabetes mellitus without complications      9. Personal history of transient ischemic attack (TIA), and cere      Apr 22, 2019 [facility] [name] [place] [facility]      RS/ DIFFICULTY BREATHING      RS DIFFICULTY BREATHING      SHORTNESS OF BREATH      1. Other chest pain      2. Shortness of breath      3. Wheezing      4. Mild intermittent asthma with (acute)  exacerbation      5. Chronic obstructive pulmonary disease, unspecified      6. Essential (primary) hypertension      7. Type 2 diabetes mellitus without complications      Apr 21, 2019 [facility] [name] [place] [facility]      NECK PAIN      VASCULAR ACCESS PROBLEM      2. Encounter for adjustment and management of vascular access de      3. Essential (primary) hypertension      4. Paroxysmal atrial fibrillation      5. Type 2 diabetes mellitus without complications      6. Chronic obstructive pulmonary disease, unspecified      7. Personal history of transient ischemic attack (TIA), and cere      8. Long term (current) use of anticoagulants  9. Personal history of other diseases of the circulatory system      Apr 21, 2019 [facility] [name] [place] [facility]      CHEST PAIN      VASCULAR ACCESS PROBLEM      4. Encounter for adjustment and management of vascular access de      5. Cervicalgia      6. Localized swelling, mass and lump, neck      7. Chest pain, unspecified      8. Procedure and treatment not carried out due to patient leavin      Apr 20, 2019 [facility] [name] [place] [facility]      PORT COMPLICATIONS      PAIN CONTROL      Other acute postprocedural pain      3. Chronic obstructive pulmonary disease, unspecified      4. Bipolar disorder, unspecified      5. Essential (primary) hypertension      6. Type 2 diabetes mellitus without complications      7. Paroxysmal atrial fibrillation      8. Epilepsy, unspecified, not intractable, without status epilep      9. Gastro-esophageal reflux disease without esophagitis      Apr 13, 2019 [facility] [name] [place] [facility]      HEADACHE, NO TRAVEL      HEADACHE RECURRENT OR KNOWN DX MIGRAINE      1. Headache      2. Nausea with vomiting, unspecified      3. Visual discomfort, bilateral      4. Migraine without aura, not intractable, without status migrai      5. Chronic obstructive pulmonary  disease, unspecified      6. Essential (primary) hypertension      7. Type 2 diabetes mellitus without complications      8. Paroxysmal atrial fibrillation      Apr 10, 2019 [facility] [name] [place] [facility]      SHORTNESS OF BREATH/ARM PAIN      SHORTNESS OF BREATH      Other postprocedural complications of skin and subcutaneous t      1. Chest pain, unspecified      2. Chills (without fever)      3. Pain in right arm      4. Other chest pain      5. Other specified postprocedural states      6. Anemia, unspecified      7. Essential (primary) hypertension      Apr 02, 2019 [facility] [name] [place] [facility]      MEDPORT, POSSIBLE INFECTION      POST OP INFECTION      Cellulitis of chest wall      1. Infection following a procedure, unspecified, init      3. Other fatigue      4. Other malaise      5. Personal history of transient ischemic attack (TIA), and cere      6. Bipolar disorder, unspecified      7. Chronic obstructive pulmonary disease, unspecified      8. Essential (primary) hypertension          Recent Inpatient Visit Summary  Date Facility [facility] Type Diagnoses or Chief Complaint   Feb 14, 2019 [place] [facility] [name]      LEFT SIDEED WEAKNESS  LEFT SIDEED WEAKNESS TIA TRANSIENT ISCHEMIC ATTACK      1. Weakness      2. Transient cerebral ischemic attack, unspecified      3. Migraine with aura, not intractable, without status migrainos      4. Chronic obstructive pulmonary disease, unspecified      5. Paroxysmal atrial fibrillation      6. Type 2 diabetes mellitus without complications      7. Bipolar disorder, unspecified      8. Essential (primary) hypertension      Jan 05, 2019 [place] [name]      Acute sialoadenitis      Chest pain, unspecified      Torticollis      Dec 18, 2018 [facility] [name]      RECTAL BLEEDING      Anemia, unspecified       Melena      1. Hemorrhage of anus and rectum      2. Diverticulosis of large intestine without perforation or absc      3. Acute posthemorrhagic anemia      4. Epilepsy, unspecified, not intractable, without status epilep      5. Paroxysmal atrial fibrillation      6. Bipolar disorder, unspecified      7. Hyperlipidemia, unspecified      Nov 21, 2018 [facility] [name]      ACUTE CHEST PAIN      1. Chest pain, unspecified      2. Other chest pain      3. Chronic diastolic (congestive) heart failure      4. Chronic obstructive pulmonary disease, unspecified      5. Hypertensive heart disease with heart failure      6. Bipolar disorder, unspecified      7. Type 2 diabetes mellitus without complications      8. 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 [place] [facility]      WEAKNESS OF LEFT UPPER EXTREMITY      Other symptoms and signs involving the musculoskeletal system      Slurred speech      WEAKNESS OF LEFT UPPER EXTREMITY PROGRESSIVE FOCAL MOTO      Jul 08, 2018 [facility] [name]      SYNCOPE      Dizziness and giddiness      SYNCOPE SLURRED SPEECH      3. Paroxysmal atrial fibrillation      4. Slurred speech      4. Syncope and collapse      5. Essential (primary) hypertension      6. Gastro-esophageal reflux disease without esophagitis      7. Type 2 diabetes mellitus without complications      8. Anxiety disorder, unspecified      Jun 20, 2018 [facility] [name] [place] [facility]      CHEST PAIN      Chest pain, unspecified      CHEST PAIN CHEST PAIN      Jun 02, 2018 [facility] [name] [place] [facility]      CHEST PAIN      Chest pain, unspecified  CHEST PAIN CHEST PAIN      2. Other chest pain      3. Chronic obstructive pulmonary disease, unspecified       4. Paroxysmal atrial fibrillation      5. Essential (primary) hypertension      6. Bipolar disorder, unspecified      7. Anxiety disorder, unspecified      8. Hyperlipidemia, unspecified          Care Team  There is not a care team on record at this time.   Collective Portal  This patient has registered at the [facility] Emergency Room: [facility] Emergency Department   For more information visit: [email or web address]     PLEASE NOTE:     1.   Any care recommendations and other clinical information are provided as guidelines or for historical purposes only, and providers should exercise their own clinical judgment when providing care.    2.   You may only use this information for purposes of treatment, payment or health care operations activities, and subject to the limitations of applicable Collective Policies.    3.   You should consult directly with the organization that provided a care guideline or other clinical history with any questions about additional information or accuracy or completeness of information provided.    ? 2020 [place] - [email or web address]

## 2019-05-11 NOTE — H&P (Signed)
ADMISSION HISTORY AND PHYSICAL EXAM    [facility], DIVISION OF HOSPITALIST MEDICINE   [place] [facility]   [place] Pager: [phone]      Date Time: 05/11/19 3:33 PM  Patient Name: [name]  Attending Physician: Charlynn Grimes, MD  Primary Care Physician: Malcolm Metro, MD    CC: Chest pain      Assessment:     Active Hospital Problems    Diagnosis   . Chest pain with moderate risk for cardiac etiology       Plan:   -Admit to [facility] Hospitalist service    Chest pain  -Trend cardiac enzymes  -Telemetry  -N.p.o. after midnight  -Cardiology consult    Type 2 diabetes  -Managed by diet  -Sliding scale insulin    Hypertension  -Controlled    P-Afib  -in sinus  -on Eliquis     Hyperlipidemia on statin      DVT Px  -Lovenox    History of Presenting Illness:   [name] is a 63 y.o. female who presents to the hospital with anterior retrosternal chest pain.  Pain is described as crushing.  It started few hours prior to going to the ER.  Relieved by morphine.  Of note patient was admitted to [name] for 3 nights with chest pain and numbness few days ago.  She had MRI of the brain but did not have any stress test.  Patient reports her last cardiac cath was 2017 and they did not put any stent.  This was done at [facility]    Past Medical History:     Past Medical History:   Diagnosis Date   . Anemia    . Asthma    . Atrial fibrillation    . Chronic obstructive pulmonary disease    . Convulsions    . Depression    . Diabetes mellitus     diet controlled    . Hyperlipidemia    . Hypertension    . TIA (transient ischemic attack)        Available old records reviewed, including: EPIC    Past Surgical History:     Past Surgical History:   Procedure Laterality Date   . CARDIAC CATHETERIZATION  2017   . HYSTERECTOMY     . pci         Family History:   History reviewed. No pertinent family history.    Social History:     Social History     Tobacco Use   Smoking Status Never Smoker   Smokeless  Tobacco Never Used     Social History     Substance and Sexual Activity   Alcohol Use Never   . Frequency: Never     Social History     Substance and Sexual Activity   Drug Use Never       Allergies:     Allergies   Allergen Reactions   . Singulair [Montelukast] Itching   . Latex    . Nitroglycerin    . Penicillins    . Tetracyclines & Related        Medications:     Current Discharge Medication List      CONTINUE these medications which have NOT CHANGED    Details   amLODIPine (NORVASC) 2.5 MG tablet Take 1 tablet (2.5 mg total) by mouth daily  Qty: 30 tablet, Refills: 0      apixaban (ELIQUIS) 5 MG Take 1 tablet (5  mg total) by mouth every 12 (twelve) hours  Qty: 30 tablet, Refills: 0      atorvastatin (LIPITOR) 40 MG tablet Take 40 mg by mouth daily      furosemide (LASIX) 20 MG tablet Take 1 tablet (20 mg total) by mouth daily  Qty: 30 tablet, Refills: 0      lactobacillus/streptococcus (RISAQUAD) Cap Take 1 capsule by mouth daily  Qty: 3 capsule, Refills: 0      lamoTRIgine (LAMICTAL) 150 MG tablet Take 1 tablet (150 mg total) by mouth 2 (two) times daily  Qty: 60 tablet, Refills: 0      levETIRAcetam (KEPPRA) 500 MG tablet Take 500 mg by mouth 2 (two) times daily      metoprolol succinate XL (TOPROL-XL) 25 MG 24 hr tablet Take 25 mg by mouth daily      pantoprazole (PROTONIX) 40 MG tablet Take 1 tablet (40 mg total) by mouth daily  Qty: 30 tablet, Refills: 0      QUEtiapine (SEROQUEL) 50 MG tablet Take 1 tablet (50 mg total) by mouth nightly  Qty: 30 tablet, Refills: 0      ranolazine (RANEXA) 500 MG 12 hr tablet Take 500 mg by mouth 2 (two) times daily      rOPINIRole (REQUIP) 1 MG tablet Take 1 tablet (1 mg total) by mouth nightly  Qty: 30 tablet, Refills: 0      acetaminophen (TYLENOL) 325 MG tablet Take 2 tablets (650 mg total) by mouth every 4 (four) hours as needed for Pain  Qty: 50 tablet, Refills: 0             Review of Systems:   All other systems were reviewed and are negative except:as above in HPI      Physical Exam:     Patient Vitals for the past 24 hrs:   BP Temp Temp src Pulse Resp SpO2 Height Weight   05/11/19 1515 - - - - - - 1.651 m (5\' 5" ) -   05/11/19 1437 119/66 98.6 F (37 C) Oral 77 18 98 % - -   05/11/19 1251 122/67 - - 85 17 99 % - -   05/11/19 1151 127/71 - - 77 19 98 % - -   05/11/19 1121 146/74 - - 76 16 100 % - -   05/11/19 1051 115/81 - - 78 15 100 % - -   05/11/19 1020 123/74 - - 81 - 100 % - -   05/11/19 0951 124/66 - - 74 14 100 % - -   05/11/19 0820 118/75 - - 87 15 99 % - -   05/11/19 0750 111/71 - - 95 15 98 % - -   05/11/19 0720 113/73 - - 100 17 98 % - -   05/11/19 0705 123/69 98.3 F (36.8 C) Oral (!) 105 17 98 % 1.651 m (5\' 5" ) 93 kg (205 lb)     Body mass index is 34.11 kg/m.  No intake or output data in the 24 hours ending 05/11/19 1533    General: awake;  no acute distress.  HEENT: perrla, eomi, sclera anicteric  oropharynx clear without lesions, mucous membranes moist  Neck: supple, no lymphadenopathy, no JVD, no carotid bruits  Cardiovascular: Normal S1 and S2, no murmurs, rubs or gallops  Lungs: clear to auscultation bilaterally, without wheezing, rhonchi, or rales  Abdomen: soft, non-tender, non-distended; no palpable masses, no hepatosplenomegaly, normoactive bowel sounds, no rebound or guarding  Extremities: no clubbing, cyanosis, or  edema  Neuro: alert, oriented x 3, cranial nerves grossly intact, strength 5/5 in upper and lower extremities, sensation intact,   Skin: no rashes or lesions noted      Labs:     Results     Procedure Component Value Units Date/Time    Troponin I [[id number]] Collected:  05/11/19 1003    Specimen:  Blood Updated:  05/11/19 1032     Troponin I 0.02 ng/mL     PT/APTT [[id number]]  (Abnormal) Collected:  05/11/19 0715     Updated:  05/11/19 0810     PT 15.3 sec      PT INR 1.2     PTT 49 sec     Troponin I [[id number]] Collected:  05/11/19 0715    Specimen:  Blood Updated:  05/11/19 0759     Troponin I 0.01 ng/mL     Comprehensive metabolic panel  [[id number]]  (Abnormal) Collected:  05/11/19 0715    Specimen:  Blood Updated:  05/11/19 0758     Glucose 127 mg/dL      BUN 8.0 mg/dL      Creatinine 0.9 mg/dL      Sodium 272 mEq/L      Potassium 3.5 mEq/L      Chloride 113 mEq/L      CO2 21 mEq/L      Calcium 8.4 mg/dL      Protein, Total 6.0 g/dL      Albumin 3.4 g/dL      AST (SGOT) 14 U/L      ALT 13 U/L      Alkaline Phosphatase 70 U/L      Bilirubin, Total 0.3 mg/dL      Globulin 2.6 g/dL      Albumin/Globulin Ratio 1.3     Anion Gap 9.0    GFR [[id number]] Collected:  05/11/19 0715     Updated:  05/11/19 0758     EGFR >60.0    CBC and differential [[id number]]  (Abnormal) Collected:  05/11/19 0715    Specimen:  Blood Updated:  05/11/19 0744     WBC 6.36 x10 3/uL      Hgb 10.0 g/dL      Hematocrit 75.6 %      Platelets 240 x10 3/uL      RBC 3.57 x10 6/uL      MCV 86.8 fL      MCH 28.0 pg      MCHC 32.3 g/dL      RDW 14 %      MPV 9.1 fL      Neutrophils 59.1 %      Lymphocytes Automated 26.1 %      Monocytes 8.3 %      Eosinophils Automated 5.3 %      Basophils Automated 0.6 %      Immature Granulocytes 0.6 %      Nucleated RBC 0.0 /100 WBC      Neutrophils Absolute 3.75 x10 3/uL      Lymphocytes Absolute Automated 1.66 x10 3/uL      Monocytes Absolute Automated 0.53 x10 3/uL      Eosinophils Absolute Automated 0.34 x10 3/uL      Basophils Absolute Automated 0.04 x10 3/uL      Immature Granulocytes Absolute 0.04 x10 3/uL      Absolute NRBC 0.00 x10 3/uL         EKG NSR  Chest Ap Portable    Result Date: 05/11/2019  No acute process. No  change from XR CHEST AP PORTABLE with report dated 01/06/2019 12:22 AM. Laurena Slimmer, MD  05/11/2019 7:58 AM        Signed by: Charlynn Grimes, MD   Cc:Malcolm Metro, MD  *This note was generated by the Epic EMR system/ Dragon speech recognition and may contain inherent errors or omissions not intended by the user. Grammatical errors, random word insertions, deletions, pronoun errors and incomplete sentences are occasional  consequences of this technology due to software limitations. Not all errors are caught or corrected. If there are questions or concerns about the content of this note or information contained within the body of this dictation they should be addressed directly with the author for clarification

## 2019-05-11 NOTE — ED Notes (Signed)
DO at bedside 

## 2019-05-11 NOTE — ED Triage Notes (Signed)
Pt c/o intermittent substernal CP with tingling down LUE and sweating that woke pt from sleep 1.5 hours ago. Pt states recently admitted at [facility] for similar s/s. Pt denies N/V or SOB, fever, chills, cough, sore throat or other associated s/s. Pt ST/NSR on monitor, aaox4, skin warm and dry, resp even and nonlabored, vss, afebrile.

## 2019-05-11 NOTE — ED Notes (Signed)
Report given to PTS and Louis, Charity fundraiser at [facility]. Pt aaox4, skin warm and dry, resp even and nonlabored, vss, afebrile. NAD noted upon departure.

## 2019-05-12 DIAGNOSIS — I10 Essential (primary) hypertension: Secondary | ICD-10-CM

## 2019-05-12 DIAGNOSIS — E785 Hyperlipidemia, unspecified: Secondary | ICD-10-CM

## 2019-05-12 DIAGNOSIS — I480 Paroxysmal atrial fibrillation: Secondary | ICD-10-CM

## 2019-05-12 LAB — TROPONIN I
Troponin I: <0.01 ng/mL (ref 0.00–0.05)
Troponin I: <0.01 ng/mL (ref 0.00–0.05)
Troponin I: <0.01 ng/mL (ref 0.00–0.05)

## 2019-05-12 LAB — GLUCOSE WHOLE BLOOD - POCT
Whole Blood Glucose POCT: 87 mg/dL (ref 70–100)
Whole Blood Glucose POCT: 93 mg/dL (ref 70–100)
Whole Blood Glucose POCT: 90 mg/dL (ref 70–100)
Whole Blood Glucose POCT: 107 mg/dL — ABNORMAL HIGH (ref 70–100)
Whole Blood Glucose POCT: 95 mg/dL (ref 70–100)

## 2019-05-12 MED ORDER — RANOLAZINE ER 500 MG PO TB12
500.00 mg | ORAL_TABLET | Freq: Once | ORAL | Status: AC
Start: 2019-05-12 — End: 2019-05-12
  Administered 2019-05-12: 500 mg via ORAL
  Filled 2019-05-12: qty 1

## 2019-05-12 MED ORDER — MORPHINE SULFATE 2 MG/ML IJ/IV SOLN (WRAP)
2.0000 mg | Freq: Four times a day (QID) | Status: DC | PRN
Start: 2019-05-12 — End: 2019-05-13
  Administered 2019-05-12 – 2019-05-13 (×4): 2 mg via INTRAVENOUS
  Filled 2019-05-12 (×4): qty 1

## 2019-05-12 MED ORDER — RANOLAZINE ER 500 MG PO TB12
1000.00 mg | ORAL_TABLET | Freq: Two times a day (BID) | ORAL | Status: DC
Start: 2019-05-12 — End: 2019-05-13
  Administered 2019-05-12 – 2019-05-13 (×2): 1000 mg via ORAL
  Filled 2019-05-12: qty 1
  Filled 2019-05-12 (×2): qty 2

## 2019-05-12 MED ORDER — ONDANSETRON HCL 4 MG/2ML IJ SOLN
4.00 mg | Freq: Four times a day (QID) | INTRAMUSCULAR | Status: DC | PRN
Start: 2019-05-12 — End: 2019-05-13
  Administered 2019-05-12 – 2019-05-13 (×2): 4 mg via INTRAVENOUS
  Filled 2019-05-12 (×2): qty 2

## 2019-05-12 NOTE — Plan of Care (Signed)
Problem: Chest Pain  Goal: Vital signs and cardiac rhythm stable  Flowsheets (Taken 05/12/2019 1559)  Vital signs and cardiac rhythm stable:   Monitor /assess vital signs/cardiac rhythms   Monitor labs  Note: Pt is on RA, lungs are clear.  Pt denies SOB.  Goal: Cardiac pain management  Flowsheets (Taken 05/12/2019 1559)  Cardiac pain management:   Assess/report chest pain/or related discomfort to LIP immediately   Instruct patient to report any change in pain status  Note: Pt c/o intermittent chest heaviness that is relief with morphine. Will continue to reassess pt for chest pain. Pt BG is check, no insulin require. EKG done for c/o chest heaviness.

## 2019-05-12 NOTE — Plan of Care (Signed)
Patient alert and oriented x 4, in room air clear lungs, abdominal sound present, continent x 2.  Complained chest pain prn med administered every 4 hours. Patient complained that morphine makes her itch, offered to changed pain med and pt said it's only morphine which work the best but she want's benadryl every 4 hrs, paged hospitalist and administered benadryl 1 hr early. Vital sign stable, patient slept most of the night. Kept her NPO after midnight for morning cardiology workup.       Problem: Moderate/High Fall Risk Score >5  Goal: Patient will remain free of falls  Outcome: Progressing  Flowsheets  Taken 05/12/2019 0000  High (Greater than 13): HIGH-Consider use of low bed  Taken 05/12/2019 0140  VH High Risk (Greater than 13):   ALL REQUIRED LOW INTERVENTIONS   PATIENT IS TO BE SUPERVISED FOR ALL TOILETING ACTIVITIES   Keep door open for better visibility     Problem: Pain  Goal: Pain at adequate level as identified by patient  Outcome: Progressing  Flowsheets (Taken 05/12/2019 0140)  Pain at adequate level as identified by patient:   Identify patient comfort function goal   Assess for risk of opioid induced respiratory depression, including snoring/sleep apnea. Alert healthcare team of risk factors identified.   Assess pain on admission, during daily assessment and/or before any "as needed" intervention(s)   Reassess pain within 30-60 minutes of any procedure/intervention, per Pain Assessment, Intervention, Reassessment (AIR) Cycle   Evaluate if patient comfort function goal is met   Evaluate patient's satisfaction with pain management progress   Offer non-pharmacological pain management interventions   Include patient/patient care companion in decisions related to pain management as needed     Problem: Side Effects from Pain Analgesia  Goal: Patient will experience minimal side effects of analgesic therapy  Outcome: Progressing  Flowsheets (Taken 05/12/2019 0140)  Patient will experience minimal side effects of  analgesic therapy:   Monitor/assess patient's respiratory status (RR depth, effort, breath sounds)   Assess for changes in cognitive function   Prevent/manage side effects per LIP orders (i.e. nausea, vomiting, pruritus, constipation, urinary retention, etc.)   Evaluate for opioid-induced sedation with appropriate assessment tool (i.e. POSS)     Problem: Discharge Barriers  Goal: Patient will be discharged home or other facility with appropriate resources  Outcome: Progressing  Flowsheets (Taken 05/12/2019 0140)  Discharge to home or other facility with appropriate resources:   Provide appropriate patient education   Provide information on available health resources   Initiate discharge planning     Problem: Chest Pain  Goal: Vital signs and cardiac rhythm stable  Outcome: Progressing  Flowsheets (Taken 05/12/2019 0140)  Vital signs and cardiac rhythm stable:   Monitor /assess vital signs/cardiac rhythms   Monitor labs   Assess the need for oxygen therapy and administer as ordered  Goal: Cardiac pain management  Outcome: Progressing  Flowsheets (Taken 05/12/2019 0140)  Cardiac pain management:   Assess/report chest pain/or related discomfort to LIP immediately   Instruct patient to report any change in pain status   Assess pain/or related discomfort on admission, during daily assessment, before and after any intervention   Include patient and patient care companion in decisions related to pain management  Goal: Anxiety management/effective coping  Outcome: Progressing  Flowsheets (Taken 05/12/2019 0140)  Anxiety management/effective coping:   Assess/report uncontrolled anxiety, depression or ineffective coping to LIP   Offer reassurance to decrease anxiety   Encourage patient to immediately report any increase in anxiety  and/or depression   Include patient in decision making of their care and give updates on their health status  Goal: Patient/Patient Care Companion demonstrates understanding of disease process,  treatment plan, medications, and discharge plan  Outcome: Progressing  Flowsheets (Taken 05/12/2019 0140)  Patient/Patient Care Companion demonstrates understanding of disease process, treatment plan, medications and discharge plan:   Educate patient to immediately report any chest pain/equivalent to RN   Reinforce with patient their activity level and to avoid Valsalva   Reinforce regarding cardiac diet and any fluid parameters   Nutrition consult as needed   Assist patient/patient care companion to identify measures for cardiac risk factor management   Consult/collaborate with Cardiac Rehabilitation   Assess need for smoking cessation and substance abuse counseling and refer as needed   Educate patient/patient care companion regarding identified learning needs   Provide appropriate resources and referrals   Plan for discharge

## 2019-05-12 NOTE — Progress Notes (Signed)
MEDICINE PROGRESS NOTE  [facility], DIVISION OF HOSPITALIST MEDICINE   [place] [facility]   [place] Pager: [phone]      Date Time: 05/12/19 5:34 PM  Patient Name: [name]  Attending Physician: Charlynn Grimes, MD  Hospital Day: 2    Subjective     CC: Chest pain with moderate risk for cardiac etiology    Interval history/last 24 hours:     Intermittent chest pain       Review of Systems:   Review of Systems - Negative except as above in HPI    Assessment:     Active Hospital Problems    Diagnosis   . Chest pain with moderate risk for cardiac etiology       Plan:   Chest pain  -ACS ruled out  -Telemetry  -N.p.o. after midnight  -Cardiology following  -stress test in the AM    Type 2 diabetes  -Managed by diet  -Sliding scale insulin    Hypertension  -Controlled    P-Afib  -in sinus  -on Eliquis     Hyperlipidemia on statin      Meds:   Medications were reviewed:  Scheduled Meds:  Current Facility-Administered Medications   Medication Dose Route Frequency   . amLODIPine  2.5 mg Oral Daily   . apixaban  5 mg Oral Q12H SCH   . atorvastatin  40 mg Oral QHS   . furosemide  20 mg Oral Daily   . insulin lispro  1-5 Units Subcutaneous TID AC   . lactobacillus/streptococcus  1 capsule Oral Daily   . lamoTRIgine  150 mg Oral BID   . levETIRAcetam  500 mg Oral BID   . metoprolol succinate XL  25 mg Oral Daily   . pantoprazole  40 mg Oral QAM AC   . psyllium  1 packet Oral Daily   . QUEtiapine  50 mg Oral QHS   . ranolazine  1,000 mg Oral BID   . rOPINIRole  1 mg Oral QHS     Continuous Infusions:  PRN Meds:.acetaminophen, Nursing communication: Adult Hypoglycemia Treatment Algorithm **AND** dextrose **AND** dextrose **AND** glucagon (rDNA), diphenhydrAMINE, morphine, naloxone, ondansetron      Labs:     Recent Labs     05/12/19  1600 05/12/19  1131 05/12/19  0636 05/12/19  0606 05/11/19  2137   Whole Blood Glucose POCT 107* 90 93 87 99       Recent Labs   Lab 05/11/19  0715   WBC 6.36   Hgb 10.0*    Hematocrit 31.0*   Platelets 240    Recent Labs   Lab 05/11/19  0715   PT 15.3*   PT INR 1.2*   PTT 49*       Recent Labs   Lab 05/11/19  0715   Sodium 143   Potassium 3.5   Chloride 113*   CO2 21*   BUN 8.0   Creatinine 0.9   EGFR >60.0   Glucose 127*   Calcium 8.4*    Recent Labs   Lab 05/11/19  0715   Alkaline Phosphatase 70   Bilirubin, Total 0.3   Protein, Total 6.0   Albumin 3.4*   ALT 13   AST (SGOT) 14            Physical Exam:     Temp:  [97.9 F (36.6 C)-98.4 F (36.9 C)] 98.1 F (36.7 C)  Heart Rate:  [71-84] 84  Resp Rate:  [  15-19] 18  BP: (99-111)/(58-74) 102/58    Intake/Output Summary (Last 24 hours) at 05/12/2019 1734  Last data filed at 05/12/2019 1321  Gross per 24 hour   Intake 608 ml   Output --   Net 608 ml    General: awake, alert ,in no acute distress  Vital signs: reviewed   Cardiovascular: regular rate and rhythm, no murmurs, rubs or gallops  Lungs: clear  Abdomen: soft, non-tender, non-distended; normoactive bowel sounds  Extremities: no edema  Skin: no rash  Neurological: AOX3           Lines:     Patient Lines/Drains/Airways Status    Active PICC Line / CVC Line / PIV Line / Drain / Airway / Intraosseous Line / Epidural Line / ART Line / Line / Wound / Pressure Ulcer / NG/OG Tube     Name:   Placement date:   Placement time:   Site:   Days:    Peripheral IV 05/11/19 Left Hand   05/11/19    0714    Hand   1                 Disposition:     Today's date: 05/12/2019  Length of Stay: 0      Signed by: Charlynn Grimes, MD

## 2019-05-12 NOTE — Progress Notes (Signed)
Received patient and will continue to monitor patient until oncoming RN arrives. Pt alert and oriented x4. No concerns.

## 2019-05-12 NOTE — Progress Notes (Signed)
Report given to Nas,RN who is taking over pt care. Pt is in bed waiting on dinner.

## 2019-05-12 NOTE — UM Notes (Signed)
Observation review    Primary payer: Medicare    HPI 05/11/19    "[name] is a 63 y.o. female who presents to the hospital with anterior retrosternal chest pain.  Pain is described as crushing.  It started few hours prior to going to the ER.  Relieved by morphine.  Of note patient was admitted to [facility] for 3 nights with chest pain and numbness few days ago.  She had MRI of the brain but did not have any stress test.  Patient reports her last cardiac cath was 2017 and they did not put any stent..."    123/69 98.3 F (36.8 C) Oral (!) 105 17 98 %     Trop x1: 0.01    EKG: non-revealing    ER TX    MSO4 4mg  x 2 doses  Zofran 4mg  IV  Benadryl 25mg  IV         Plan:   -Admit to [facility] Hospitalist service    Chest pain  -Trend cardiac enzymes  -Telemetry  -N.p.o. after midnight  -Cardiology consult    Type 2 diabetes  -Managed by diet  -Sliding scale insulin    Hypertension  -Controlled    P-Afib  -in sinus  -on Eliquis     Hyperlipidemia on statin      Place in Observation Services (Order [id number])   Admission   Date: 05/11/2019 at 1125            Ane Payment, Case Manager, [phone]

## 2019-05-12 NOTE — Progress Notes (Signed)
PCP NOTIFICATION  Patient/patient designee was asked if patient/patient designee wanted to have his/her PCP notified. Patient/patient designee stated yes. CM called PCP and notified the physician that the patient is now hospitalized.       05/12/19 1313   Patient Type   Within 30 Days of Previous Admission? No   Healthcare Decisions   Interviewed: Patient   Orientation/Decision Making Abilities of Patient Alert and Oriented x3, able to make decisions   Advance Directive Patient does not have advance directive   Healthcare Agent Appointed No   Additional Emergency Contacts? RUEAV,[phone]   Prior to admission   Prior level of function Independent with ADLs;Ambulates with assistive device   Type of Residence Private residence   Home Layout 1[address] [place] on main level   Have running water, electricity, heat, etc? Yes   Living Arrangements Family members;Children   How do you get to your MD appointments? SELF OR FAMILY   How do you get your groceries? SELF OR FAMILY   Who fixes your meals? SELF OR FAMILY   Who does your laundry? SELF OR FAMILY   Who picks up your prescriptions? SELF OR FAMILY   Dressing Independent   Grooming Independent   Feeding Independent   Bathing Independent   Toileting Independent   DME Currently at [facility]   Name of Prior Assisted Living Facility   (N/A)   Home Care/Community Services   (N/A)   Prior SNF admission? (Detail)   (N/A)   Prior Rehab admission? (Detail)   (N/A)   Adult Protective Services (APS) involved? No   Discharge Planning   Support Systems Family members;Children   Patient expects to be discharged to:   [facility]   Anticipated [place] plan discussed with: Same as interviewed    discussion contact information:   (SAME AS ABOVE)   Potential barriers to discharge:   (N/A)   Mode of transportation: Private car (family member)   Does the patient have perscription coverage? Yes   Consults/Providers   PT Evaluation Needed 1   OT Evalulation Needed 1    SLP Evaluation Needed 2   Outcome Palliative Care Screen Screened but did not meet criteria for intervention   Correct PCP listed in Epic? Yes   PCP   PCP on file was verified as the current PCP? Yes   Important Message from [facility] Notice   Patient received 1st IMM Letter? Yes

## 2019-05-12 NOTE — Consults (Addendum)
CARDIOLOGY HOSPITAL NOTE  Date:  05/12/2019  10:37 AM    Bari Edward, MD [facility] Cardiology  Office phone:  [phone]     [facility] phone [phone], [facility] phone [phone]  [facility] [place] hospital  [phone]/ [phone]    Patient:  [name].  DOB: 05/03/1956.  female  Date of Admission:  05/11/2019  Attending:  Charlynn Grimes, MD     Patient was seen in consultation at the request of Dr Susa Raring    ASSESSMENT & PLAN:   63 y.o. female  With HTN , HPL, DM ,CVA , Seizures  who presents chest discomfort       Chest discomfort: Atypical.  However patient tells me this symptoms are different than her prior chronic chest discomfort  Cardiac biomarkers negative.  EKG with dynamic T wave changes   Cardiac cath   01/2013 for chest pain and dynamic EKG changes with angiographically normal coronaries  ?  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 female  With HTN , HPL, DM ,CVA , Seizures  who presents chest discomfort    Patient with longstanding history of chest discomfort with multiple ER admissions and most recent admission to [facility]  a few days ago  She had a cardiac cath in April 2014 for chest discomfort and dynamic EKG changes however with angiographically normal coronaries  She tells me she had a repeat cardiac cath in 2017, however I am unable to find those records for review    She tells me however  that yesterday when she woke up she felt like an elephant sitting on her chest and with associated shortness of breath and nausea along with left arm numbness.  These episodes last anywhere from 5 to 7 minutes nonexertional nonpleuritic non-positional non reproducible.  She has had on and off symptoms since yesterday and is requesting morphine    She has had a history of chronic chest discomfort with multiple ER admissions however she tells me that this chest discomfort is different  She states she has had reaction to nitroglycerin and develops hives but is tolerating Ranexa 500 mg twice daily      REVIEW OF SYSTEMS:                                              ARROS, ROSSSSS   All other systems were reviewed and were negative except as noted in HPI  CARDIAC DIAGNOSTICS:  ECG:  (I independently reviewed the ECG tracing) 05/11/2019  Sinus tachycardia@103 /m, T wave inversions not prominent    Subsequent EKG's , T wave inversions prominent   Telemetry:  (I independently reviewed the telemetry data)  NSR    Chest -Xray:  05/11/2019:  No acute process.    Echocardiogram:   01/2017 : Normal LV size.  LVEF of 55 to 60%  Mild AR  06/2018: Moderate AR LVEF of 60% grade 1 diastolic dysfunction    PAST MEDICAL HISTORY:     Past Medical History:   Diagnosis Date   . Anemia    . Asthma    . Atrial fibrillation    . Chronic obstructive pulmonary disease    . Convulsions    . Depression    . Diabetes mellitus     diet controlled    . Hyperlipidemia    . Hypertension    . TIA (transient ischemic attack)      Past Surgical History:   Procedure Laterality Date   . CARDIAC CATHETERIZATION  2017   . HYSTERECTOMY     . pci         SOCIAL HISTORY:     Social History     Tobacco Use   . Smoking status: Never Smoker   . Smokeless tobacco: Never Used   Substance Use Topics   . Alcohol use: Never     Frequency: Never       FAMILY HISTORY:   family history is not on file.    PHYSICAL EXAM:                                                            BP 110/74   Pulse 77   Temp 98.4 F (36.9 C) (Oral)   Resp 15   Ht 1.651 m (5\' 5" )   Wt 100.4 kg (221 lb 4.8 oz)   LMP  (LMP Unknown)   SpO2 99%   BMI 36.83 kg/m      Intake/Output Summary (Last 24 hours) at 05/12/2019 1037  Last data filed at 05/12/2019 0606  Gross per 24 hour   Intake 358 ml   Output --   Net 358 ml       Constitutional :  no acute distress,  Eyes:  No Pallor or Icterus.  ENMT:  Teeth .  mucous membranes moist.. No Cyanosis  Neck: No JVD.   normal thyroid gland  Respiratory:  Good air movement and respiratory effort  bilaterally. No use of accessory muscles. Clear bilaterally. No rales orwheezes.  Cardiovascular:   PMI  Not displaced. Regular . Nl S1 and S2..  NO 2/6 EDM at base. No carotid bruits.   No abdominal bruits heard . Dorsal pedis 2+b/l.  No  edema.    Extremities:  Clubbing and cyanosis  Gastrointestinal Soft. Non-tender. Normoactive BS. No abdominal bruits  Skin: Warm. Dry  No Ulcers  Neurologic: Grossly intact.    Musculoskeletal:  Kyphosis or Scoliosis.  Normal gait .  No deformities  Psychiatric: AAO X3.  Normal mood and effect.    LABORATORY:     CBC w/Diff   Recent Labs   Lab 05/11/19  0715   WBC 6.36   Hgb 10.0*   Hematocrit 31.0*   Platelets 240  Basic Metabolic Profile   Recent Labs   Lab 05/11/19  0715   Sodium 143   Potassium 3.5   Chloride 113*   CO2 21*   BUN 8.0   Creatinine 0.9   EGFR >60.0   Glucose 127*   Calcium 8.4*        Cardiac Enzymes   Recent Labs   Lab 05/12/19  0633 05/12/19  0029 05/11/19  1003   Troponin I <0.01 <0.01 0.02            D-dimer          Thyroid Studies         Invalid input(s): FREET4     Cholesterol Panel          Coagulation Studies   Recent Labs   Lab 05/11/19  0715   PT 15.3*   PT INR 1.2*   PTT 49*        MEDICATIONS:    INFUSION MEDS:      SCHEDULED MEDS:     Current Facility-Administered Medications   Medication Dose Route Frequency   . amLODIPine  2.5 mg Oral Daily   . apixaban  5  mg Oral Q12H SCH   . atorvastatin  40 mg Oral QHS   . furosemide  20 mg Oral Daily   . insulin lispro  1-5 Units Subcutaneous TID AC   . lactobacillus/streptococcus  1 capsule Oral Daily   . lamoTRIgine  150 mg Oral BID   . levETIRAcetam  500 mg Oral BID   . metoprolol succinate XL  25 mg Oral Daily   . pantoprazole  40 mg Oral QAM AC   . psyllium  1 packet Oral Daily   . QUEtiapine  50 mg Oral QHS   . ranolazine  500 mg Oral BID   . rOPINIRole  1 mg Oral QHS      PRN MEDS:   acetaminophen, Nursing communication: Adult Hypoglycemia Treatment Algorithm **AND** dextrose **AND** dextrose **AND** glucagon (rDNA), diphenhydrAMINE, morphine, naloxone    MISCELLANEOUS:                                                            DISCL   This note was generated by the Epic EMR system/ Dragon speech recognition and may contain inherent errors or omissions not intended by the user. Grammatical errors, random word insertions, deletions and pronoun errors  are occasional consequences of this technology due to software limitations. Not all errors are caught or corrected. If there are questions or concerns about the content of this note or information contained within the body of this dictation they should be addressed directly with the author for clarification

## 2019-05-13 ENCOUNTER — Other Ambulatory Visit: Payer: Self-pay

## 2019-05-13 LAB — GLUCOSE WHOLE BLOOD - POCT
Whole Blood Glucose POCT: 93 mg/dL (ref 70–100)
Whole Blood Glucose POCT: 124 mg/dL — ABNORMAL HIGH (ref 70–100)

## 2019-05-13 LAB — ECG 12-LEAD
Atrial Rate: 103 {beats}/min
Atrial Rate: 76 {beats}/min
Atrial Rate: 75 {beats}/min
Atrial Rate: 80 {beats}/min
Atrial Rate: 82 {beats}/min
P Axis: 33 degrees
P Axis: 27 degrees
P Axis: 28 degrees
P Axis: 37 degrees
P Axis: 34 degrees
P-R Interval: 168 ms
P-R Interval: 124 ms
P-R Interval: 128 ms
P-R Interval: 168 ms
P-R Interval: 168 ms
Q-T Interval: 350 ms
Q-T Interval: 420 ms
Q-T Interval: 430 ms
Q-T Interval: 436 ms
Q-T Interval: 426 ms
QRS Duration: 82 ms
QRS Duration: 72 ms
QRS Duration: 86 ms
QRS Duration: 82 ms
QRS Duration: 86 ms
QTC Calculation (Bezet): 458 ms
QTC Calculation (Bezet): 472 ms
QTC Calculation (Bezet): 480 ms
QTC Calculation (Bezet): 502 ms
QTC Calculation (Bezet): 497 ms
R Axis: 16 degrees
R Axis: 7 degrees
R Axis: 40 degrees
R Axis: 13 degrees
R Axis: 19 degrees
T Axis: -8 degrees
T Axis: 25 degrees
T Axis: 18 degrees
T Axis: 49 degrees
T Axis: 34 degrees
Ventricular Rate: 103 {beats}/min
Ventricular Rate: 76 {beats}/min
Ventricular Rate: 75 {beats}/min
Ventricular Rate: 80 {beats}/min
Ventricular Rate: 82 {beats}/min

## 2019-05-13 MED ORDER — RANOLAZINE ER 1000 MG PO TB12
1000.00 mg | ORAL_TABLET | Freq: Two times a day (BID) | ORAL | 0 refills | Status: AC
Start: 2019-05-13 — End: 2019-06-12

## 2019-05-13 MED ORDER — KETOROLAC TROMETHAMINE 30 MG/ML IJ SOLN
30.00 mg | Freq: Once | INTRAMUSCULAR | Status: AC
Start: 2019-05-13 — End: 2019-05-13
  Administered 2019-05-13: 30 mg via INTRAVENOUS
  Filled 2019-05-13: qty 1

## 2019-05-13 MED ORDER — PSYLLIUM 58.12 % PO PACK
1.00 | PACK | Freq: Every day | ORAL | 0 refills | Status: DC
Start: 2019-05-13 — End: 2020-07-07

## 2019-05-13 NOTE — Progress Notes (Signed)
Patient Active Problem List    Diagnosis Date Noted   . Chest pain with moderate risk for cardiac etiology 05/11/2019   . Non-traumatic subcutaneous emphysema 01/07/2019   . Chest pain with high risk of acute coronary syndrome 01/06/2019   . Paroxysmal atrial fibrillation 01/06/2019   . Hypertension 01/06/2019   . Hyperlipidemia 01/06/2019   . Seizure disorder 01/06/2019   . History of gastrointestinal hemorrhage 01/06/2019   . Depression 01/06/2019   . Anemia 01/06/2019   . Asthma 01/06/2019   . Chest pain 08/22/2018   . CVA (cerebral vascular accident) 08/21/2018         Asessment and plan:  63 yo female with h/o DM and HTN presents with chest pain. No evidence of ACS by ECG or enzymes. Cardiac cath without obstructive CAD in 2014 and 2017. [name] had lip swelling after injection of tetrofosmin this AM and will defer stress testing. Chest pain is atypical in that it is pleuritic and reproducible. Suspect inflammatory etiology. Can readdress stress testing as outpatient if symptoms persist. BP adequately controlled. Ranolazine dose increased.      Subjective:  Chest pain for 8-10 minutes. NOT exertional. Pleuritic.    Exam:  BP 121/81   Pulse 81   Temp 98.6 F (37 C) (Oral)   Resp 16   Ht 1.651 m (5\' 5" )   Wt 100.4 kg (221 lb 4.8 oz)   LMP  (LMP Unknown)   SpO2 100%   BMI 36.83 kg/m     HEENT: JVP to 5cm   Chest: clear to auscultation bilaterally  Heart: regular rate and rhythm  Abd: soft, NT, ND  MSK: chest pain reproduced by pectoral abduction  Neuro: oriented    Telemetry independently reviewed by me shows sinus in 70s-80s    Labs:    Lab Results   Component Value Date    BUN 8.0 05/11/2019    NA 143 05/11/2019    K 3.5 05/11/2019    CL 113 (H) 05/11/2019    CO2 21 (L) 05/11/2019       Lab Results   Component Value Date    WBC 6.36 05/11/2019    HGB 10.0 (L) 05/11/2019    HCT 31.0 (L) 05/11/2019    MCV 86.8 05/11/2019    PLT 240 05/11/2019       Lab Results   Component Value Date    CHOL 130  01/06/2019     Lab Results   Component Value Date    HDL 55 01/06/2019     No components found for: [facility]  Lab Results   Component Value Date    TRIG 68 01/06/2019     No components found for: CHOLHDL    No components found for: DIGOXIN    No results found for: CKTOTAL, CKMB, CKMBINDEX      Leamon Arnt MD  [facility] Cardiology    Pg [phone]  Office [phone]

## 2019-05-13 NOTE — Plan of Care (Signed)
Pt is A&Ox4, cooperative, intermittently anxious with initiation of chest pain. Pt c/o of chest pain at 7:30 pm, EKG performed. Emotional support and medication administered. Pt is in SR on telebox and most recent EKG. Pt has c/o pain every couple of hours and is reassured by nurse. Continued PRN morphine q6h with benadryl PRN. Pt is on RA with clear lung sounds. Pt is continent of bowel and bladder. Skin is intact.    Family member (sister) called nurse and was updated about chest pain and plan of care.  Problem: Safety  Goal: Patient will be free from injury during hospitalization  Outcome: Progressing  Flowsheets (Taken 05/13/2019 0021)  Patient will be free from injury during hospitalization:   Assess patient's risk for falls and implement fall prevention plan of care per policy   Provide and maintain safe environment   Use appropriate transfer methods   Ensure appropriate safety devices are available at the bedside   Include patient/ family/ care giver in decisions related to safety   [place]our[place] rounding     Problem: Chest Pain  Goal: Vital signs and cardiac rhythm stable  Outcome: Progressing  Flowsheets (Taken 05/13/2019 0021)  Vital signs and cardiac rhythm stable:   Monitor /assess vital signs/cardiac rhythms   Assess the need for oxygen therapy and administer as ordered  Goal: Cardiac pain management  Outcome: Progressing  Flowsheets (Taken 05/13/2019 0021)  Cardiac pain management:   Assess/report chest pain/or related discomfort to LIP immediately   Instruct patient to report any change in pain status   Assess pain/or related discomfort on admission, during daily assessment, before and after any intervention   Include patient and patient care companion in decisions related to pain management  Goal: Anxiety management/effective coping  Outcome: Progressing  Flowsheets (Taken 05/13/2019 0021)  Anxiety management/effective coping:   Assess/report uncontrolled anxiety, depression or ineffective coping to LIP   Offer  reassurance to decrease anxiety   Encourage patient to immediately report any increase in anxiety and/or depression   Include patient in decision making of their care and give updates on their health status

## 2019-05-13 NOTE — Progress Notes (Signed)
Discharge paperwork reviewed with patient, all questions answered. Pt left with written RX for psyllium powder and ranolazine. Pt verified personal belongings accounted for including ipad, cell phone, purse, wallet and chargers. Tele removed from patient. Will plan to remove IV prior to d/c unit.

## 2019-05-13 NOTE — Plan of Care (Signed)
Pt c/o heaviness of tongue and generalized itching after injection of myoview for resting images. Stress test cancelled. Pt received benadryl this am on 6B. Symptoms improved prior to leaving the stress lab. D/w Dr Keturah Barre.

## 2019-05-13 NOTE — Discharge Summary (Signed)
MEDICINE DISCHARGE SUMMARY    Date Time: 05/13/19 4:32 PM  Patient Name: [name]  Attending Physician: Charlynn Grimes, MD  Primary Care Physician: Malcolm Metro, MD    Date of Admission: 05/11/2019  Date of Discharge: 05/13/2019    Discharge Diagnoses:     Principal Diagnosis (Diagnosis after study, that is chiefly responsible for admission):   Atypical chest pain  Type 2 diabetes  Hypertension  Paroxysmal atrial fibrillation  Hyperlipidemia    Disposition:    Disposition: home with family      Procedures/Radiology performed:   Radiology: all results from this admission  Chest Ap Portable    Result Date: 05/11/2019  No acute process. No change from XR CHEST AP PORTABLE with report dated 01/06/2019 12:22 AM. Laurena Slimmer, MD  05/11/2019 7:58 AM    Surgery: all results from this admission  * No surgery found [facility] Course:     Reason for admission/ HPI: See HPI     Hospital Course:     ACS ruled out.  Pain comes and goes.  Reproducible.  Plan is to do cardiac stress test.  Patient developed heaviness of the tongue and generalized itching after injection of Myoview for resting images.  This happened after patient received Benadryl 25 mg with morphine prior to going down for the stress test.  Cardiac stress test was canceled.  Patient advised to follow-up with her primary cardiologist as outpatient.  Ranexa dose was increased to thousand milligrams twice a day.   Discharge condition: stable    Discharge Day Exam:  Today:  BP 117/73   Pulse 81   Temp 98.3 F (36.8 C) (Oral)   Resp 16   Ht 1.651 m (5\' 5" )   Wt 100.4 kg (221 lb 4.8 oz)   LMP  (LMP Unknown)   SpO2 100%   BMI 36.83 kg/m   Ranges for the last 24 hours:  Temp:  [98.2 F (36.8 C)-98.6 F (37 C)] 98.3 F (36.8 C)  Heart Rate:  [76-88] 81  Resp Rate:  [16-19] 16  BP: (101-121)/(67-81) 117/73  Body mass index is 36.83 kg/m.      Intake/Output Summary (Last 24 hours) at 05/13/2019 1632  Last data filed at 05/13/2019 1355  Gross per 24 hour    Intake 1197 ml   Output --   Net 1197 ml    General: awake, alert ,in no acute distress  Cardiovascular: regular rate and rhythm, no murmurs, rubs or gallops  Lungs: clear to auscultation bilaterally, no additional sounds  Abdomen: soft, non-tender, non-distended; normoactive bowel sounds  Extremities: no edema  Neurological: Alert and oriented X 3, moves all extremities.         Wounds/decutibus ulcers/stage:    Consultations:   Treatment Team:   Attending Provider: Charlynn Grimes, MD  Consulting Physician: Bari Edward, MD  Recent Labs - Last 2:       Recent Labs   Lab 05/11/19  0715   WBC 6.36   Hgb 10.0*   Hematocrit 31.0*   Platelets 240      Recent Labs   Lab 05/11/19  0715   PT 15.3*   PT INR 1.2*   PTT 49*      Recent Labs   Lab 05/11/19  0715   Sodium 143   Potassium 3.5   Chloride 113*   CO2 21*   BUN 8.0   Creatinine 0.9   EGFR >60.0   Glucose  127*   Calcium 8.4*     Recent Labs   Lab 05/11/19  0715   Alkaline Phosphatase 70   Bilirubin, Total 0.3   Protein, Total 6.0   Albumin 3.4*   ALT 13   AST (SGOT) 14            Invalid input(s): FREET4         Microbiology Results     None          Discharge Instructions & Follow Up Plan for Patient:   Discharge Diet: Supervise For Meals Frequency: All meals  Diet consistent carbohydrate  Activity/Weight Bearing Status: as tolerated   Patient was instructed to follow up with:     Follow-up Information     Malcolm Metro, MD Follow up in 1 week(s).    Specialty:  Family Medicine  Contact information:  [address]  [place] [address]  [phone]             Miachel Roux, MD Follow up in 1 week(s).    Specialty:  Cardiology  Contact information:  [address]  [place] [address]  [phone]                   Complete instructions and follow up are in the patient's After Visit Summary    Minutes spent coordinating discharge and reviewing discharge plan:25 minutes    Discharge Medications:        Medication List      CHANGE how you  take these medications    ranolazine 1000 MG SR tablet  Commonly known as:  RANEXA  Take 1 tablet (1,000 mg total) by mouth 2 (two) times daily  What changed:     medication strength   how much to take   when to take this   reasons to take this        CONTINUE taking these medications    acetaminophen 325 MG tablet  Commonly known as:  TYLENOL  Take 2 tablets (650 mg total) by mouth every 4 (four) hours as needed for Pain     albuterol 108 (90 Base) MCG/ACT inhaler  Commonly known as:  PROVENTIL HFA     amLODIPine 2.5 MG tablet  Commonly known as:  NORVASC  Take 1 tablet (2.5 mg total) by mouth daily     apixaban 5 MG  Commonly known as:  ELIQUIS  Take 1 tablet (5 mg total) by mouth every 12 (twelve) hours     atorvastatin 40 MG tablet  Commonly known as:  LIPITOR     budesonide-formoterol 80-4.5 MCG/ACT inhaler  Commonly known as:  SYMBICORT     furosemide 20 MG tablet  Commonly known as:  LASIX  Take 1 tablet (20 mg total) by mouth daily     lactobacillus/streptococcus Caps  Take 1 capsule by mouth daily     lamoTRIgine 150 MG tablet  Commonly known as:  LaMICtal  Take 1 tablet (150 mg total) by mouth 2 (two) times daily     Latuda 40 MG Tabs  Generic drug:  Lurasidone HCl     levETIRAcetam 500 MG tablet  Commonly known as:  KEPPRA     metoprolol succinate XL 25 MG 24 hr tablet  Commonly known as:  TOPROL-XL     pantoprazole 40 MG tablet  Commonly known as:  PROTONIX  Take 1 tablet (40 mg total) by mouth daily     psyllium  58.12 % Pack packet  Commonly known as:  METAMUCIL  Take 1 packet by mouth daily     QUEtiapine 50 MG tablet  Commonly known as:  SEROquel  Take 1 tablet (50 mg total) by mouth nightly     rOPINIRole 1 MG tablet  Commonly known as:  REQUIP  Take 1 tablet (1 mg total) by mouth nightly           Where to Get Your Medications      You can get these medications from any pharmacy    Bring a paper prescription for each of these medications   psyllium 58.12 % Pack packet   ranolazine 1000 MG SR  tablet         Immunizations provided:   There is no immunization history on file for this patient.    This note was generated by the Epic EMR system/ Dragon speech recognition and may contain inherent errors or omissions not intended by the user. Grammatical errors, random word insertions, deletions and pronoun errors  are occasional consequences of this technology due to software limitations. Not all errors are caught or corrected. If there are questions or concerns about the content of this note or information contained within the body of this dictation they should be addressed directly with the author for clarification.    Signed by: Charlynn Grimes, MD  [facility]  Department of Medicine  CC: Malcolm Metro, MD

## 2019-05-13 NOTE — Discharge Instr - AVS First Page (Signed)
Reason for your Hospital Admission:  Atypical chest pain      Instructions for after your discharge:  Please follow up with your doctors

## 2019-05-13 NOTE — Progress Notes (Signed)
VSS. A&O x4. No pain complaint. pt was [place] home. IV lines removed. Tele removed and returned. Belongings accounted for and signed off by pt. [place] edu given, opportunity for questions provided. Pt was wheeled out of the unit at abt 1640 by a staff.

## 2019-05-13 NOTE — Progress Notes (Signed)
Pt off for ST

## 2019-05-20 ENCOUNTER — Emergency Department: Payer: Medicare Other

## 2019-05-20 ENCOUNTER — Observation Stay: Payer: Medicare Other

## 2019-05-20 ENCOUNTER — Observation Stay
Admission: EM | Admit: 2019-05-20 | Discharge: 2019-05-21 | Disposition: A | Discharge status: Home / Self Care | Payer: Medicare Other | Attending: Internal Medicine | Admitting: Internal Medicine

## 2019-05-20 DIAGNOSIS — I482 Chronic atrial fibrillation, unspecified: Secondary | ICD-10-CM | POA: Insufficient documentation

## 2019-05-20 DIAGNOSIS — R531 Weakness: Secondary | ICD-10-CM | POA: Insufficient documentation

## 2019-05-20 DIAGNOSIS — E785 Hyperlipidemia, unspecified: Secondary | ICD-10-CM | POA: Insufficient documentation

## 2019-05-20 DIAGNOSIS — I10 Essential (primary) hypertension: Secondary | ICD-10-CM | POA: Insufficient documentation

## 2019-05-20 DIAGNOSIS — R4781 Slurred speech: Secondary | ICD-10-CM

## 2019-05-20 DIAGNOSIS — Z880 Allergy status to penicillin: Secondary | ICD-10-CM | POA: Insufficient documentation

## 2019-05-20 DIAGNOSIS — G8929 Other chronic pain: Secondary | ICD-10-CM | POA: Insufficient documentation

## 2019-05-20 DIAGNOSIS — E119 Type 2 diabetes mellitus without complications: Secondary | ICD-10-CM | POA: Insufficient documentation

## 2019-05-20 DIAGNOSIS — I669 Occlusion and stenosis of unspecified cerebral artery: Secondary | ICD-10-CM

## 2019-05-20 DIAGNOSIS — R29810 Facial weakness: Principal | ICD-10-CM | POA: Insufficient documentation

## 2019-05-20 DIAGNOSIS — D649 Anemia, unspecified: Secondary | ICD-10-CM

## 2019-05-20 DIAGNOSIS — I480 Paroxysmal atrial fibrillation: Secondary | ICD-10-CM | POA: Insufficient documentation

## 2019-05-20 DIAGNOSIS — I6782 Cerebral ischemia: Secondary | ICD-10-CM | POA: Insufficient documentation

## 2019-05-20 DIAGNOSIS — Z8673 Personal history of transient ischemic attack (TIA), and cerebral infarction without residual deficits: Secondary | ICD-10-CM | POA: Insufficient documentation

## 2019-05-20 DIAGNOSIS — J449 Chronic obstructive pulmonary disease, unspecified: Secondary | ICD-10-CM | POA: Insufficient documentation

## 2019-05-20 DIAGNOSIS — G40909 Epilepsy, unspecified, not intractable, without status epilepticus: Secondary | ICD-10-CM | POA: Insufficient documentation

## 2019-05-20 DIAGNOSIS — R079 Chest pain, unspecified: Secondary | ICD-10-CM | POA: Insufficient documentation

## 2019-05-20 DIAGNOSIS — Z7901 Long term (current) use of anticoagulants: Secondary | ICD-10-CM | POA: Insufficient documentation

## 2019-05-20 LAB — PT/INR
PT INR: 1.1 (ref 0.9–1.1)
PT: 13.8 s (ref 12.6–15.0)

## 2019-05-20 LAB — CBC AND DIFFERENTIAL
Absolute NRBC: 0 10*3/uL (ref 0.00–0.00)
Basophils Absolute Automated: 0.05 10*3/uL (ref 0.00–0.08)
Basophils Automated: 0.7 %
Eosinophils Absolute Automated: 0.38 10*3/uL (ref 0.00–0.44)
Eosinophils Automated: 5.7 %
Hematocrit: 33.9 % — ABNORMAL LOW (ref 34.7–43.7)
Hgb: 11.1 g/dL — ABNORMAL LOW (ref 11.4–14.8)
Immature Granulocytes Absolute: 0.02 10*3/uL (ref 0.00–0.07)
Immature Granulocytes: 0.3 %
Lymphocytes Absolute Automated: 2.5 10*3/uL (ref 0.42–3.22)
Lymphocytes Automated: 37.3 %
MCH: 28.1 pg (ref 25.1–33.5)
MCHC: 32.7 g/dL (ref 31.5–35.8)
MCV: 85.8 fL (ref 78.0–96.0)
MPV: 9.6 fL (ref 8.9–12.5)
Monocytes Absolute Automated: 0.58 10*3/uL (ref 0.21–0.85)
Monocytes: 8.7 %
Neutrophils Absolute: 3.17 10*3/uL (ref 1.10–6.33)
Neutrophils: 47.3 %
Nucleated RBC: 0 /100 WBC (ref 0.0–0.0)
Platelets: 305 10*3/uL (ref 142–346)
RBC: 3.95 10*6/uL (ref 3.90–5.10)
RDW: 13 % (ref 11–15)
WBC: 6.7 10*3/uL (ref 3.10–9.50)

## 2019-05-20 LAB — GLUCOSE WHOLE BLOOD - POCT: Whole Blood Glucose POCT: 96 mg/dL (ref 70–100)

## 2019-05-20 LAB — COMPREHENSIVE METABOLIC PANEL
ALT: 11 U/L (ref 0–55)
AST (SGOT): 13 U/L (ref 5–34)
Albumin/Globulin Ratio: 1.3 (ref 0.9–2.2)
Albumin: 3.8 g/dL (ref 3.5–5.0)
Alkaline Phosphatase: 70 U/L (ref 37–106)
Anion Gap: 11 (ref 5.0–15.0)
BUN: 6 mg/dL — ABNORMAL LOW (ref 7–19)
Bilirubin, Total: 0.3 mg/dL (ref 0.2–1.2)
CO2: 25 mEq/L (ref 22–29)
Calcium: 9.1 mg/dL (ref 8.5–10.5)
Chloride: 107 mEq/L (ref 100–111)
Creatinine: 0.9 mg/dL (ref 0.6–1.0)
Globulin: 3 g/dL (ref 2.0–3.6)
Glucose: 91 mg/dL (ref 70–100)
Potassium: 3.5 mEq/L (ref 3.5–5.1)
Protein, Total: 6.8 g/dL (ref 6.0–8.3)
Sodium: 143 mEq/L (ref 136–145)

## 2019-05-20 LAB — TROPONIN I: Troponin I: 0.02 ng/mL (ref 0.00–0.05)

## 2019-05-20 LAB — APTT: PTT: 41 s — ABNORMAL HIGH (ref 23–37)

## 2019-05-20 LAB — GFR: EGFR: >60

## 2019-05-20 MED ORDER — RISAQUAD PO CAPS
1.00 | ORAL_CAPSULE | Freq: Every day | ORAL | Status: DC
Start: 2019-05-20 — End: 2019-05-21
  Administered 2019-05-21: 1 via ORAL
  Filled 2019-05-20: qty 1

## 2019-05-20 MED ORDER — DIPHENHYDRAMINE HCL 50 MG/ML IJ SOLN
12.50 mg | Freq: Four times a day (QID) | INTRAMUSCULAR | Status: DC | PRN
Start: 2019-05-20 — End: 2019-05-21
  Administered 2019-05-20 (×2): 12.5 mg via INTRAVENOUS
  Filled 2019-05-20 (×2): qty 1

## 2019-05-20 MED ORDER — ENOXAPARIN SODIUM 100 MG/ML SC SOLN
1.00 mg/kg | Freq: Two times a day (BID) | SUBCUTANEOUS | Status: DC
Start: 2019-05-20 — End: 2019-05-21
  Administered 2019-05-20 – 2019-05-21 (×2): 100 mg via SUBCUTANEOUS
  Filled 2019-05-20 (×2): qty 1

## 2019-05-20 MED ORDER — SODIUM CHLORIDE 0.9 % IV SOLN
500.00 mg | Freq: Two times a day (BID) | INTRAVENOUS | Status: DC
Start: 2019-05-20 — End: 2019-05-21
  Administered 2019-05-20 – 2019-05-21 (×2): 500 mg via INTRAVENOUS
  Filled 2019-05-20 (×4): qty 5

## 2019-05-20 MED ORDER — ALBUTEROL-IPRATROPIUM 2.5-0.5 (3) MG/3ML IN SOLN
3.00 mL | Freq: Four times a day (QID) | RESPIRATORY_TRACT | Status: DC | PRN
Start: 2019-05-20 — End: 2019-05-21

## 2019-05-20 MED ORDER — FAMOTIDINE 10 MG/ML IV SOLN (WRAP)
20.00 mg | Freq: Two times a day (BID) | INTRAVENOUS | Status: DC
Start: 2019-05-20 — End: 2019-05-21
  Administered 2019-05-20 – 2019-05-21 (×2): 20 mg via INTRAVENOUS
  Filled 2019-05-20 (×3): qty 2

## 2019-05-20 MED ORDER — SODIUM CHLORIDE 0.9 % IV SOLN
12.5000 mg | Freq: Once | INTRAVENOUS | Status: AC
Start: 2019-05-20 — End: 2019-05-20
  Administered 2019-05-20: 12.5 mg via INTRAVENOUS
  Filled 2019-05-20: qty 1

## 2019-05-20 MED ORDER — RANOLAZINE ER 500 MG PO TB12
1000.00 mg | ORAL_TABLET | Freq: Two times a day (BID) | ORAL | Status: DC
Start: 2019-05-20 — End: 2019-05-21
  Administered 2019-05-21: 1000 mg via ORAL
  Filled 2019-05-20: qty 2

## 2019-05-20 MED ORDER — ASPIRIN 81 MG PO CHEW
81.00 mg | CHEWABLE_TABLET | Freq: Once | ORAL | Status: DC
Start: 2019-05-20 — End: 2019-05-20

## 2019-05-20 MED ORDER — ACETAMINOPHEN 325 MG PO TABS
650.0000 mg | ORAL_TABLET | Freq: Four times a day (QID) | ORAL | Status: DC | PRN
Start: 2019-05-20 — End: 2019-05-21

## 2019-05-20 MED ORDER — NALOXONE HCL 0.4 MG/ML IJ SOLN (WRAP)
0.20 mg | INTRAMUSCULAR | Status: DC | PRN
Start: 2019-05-20 — End: 2019-05-21

## 2019-05-20 MED ORDER — ENOXAPARIN SODIUM 40 MG/0.4ML SC SOLN
40.00 mg | Freq: Every day | SUBCUTANEOUS | Status: DC
Start: 2019-05-20 — End: 2019-05-20

## 2019-05-20 MED ORDER — FUROSEMIDE 20 MG PO TABS
20.0000 mg | ORAL_TABLET | Freq: Every day | ORAL | Status: DC
Start: 2019-05-20 — End: 2019-05-21
  Administered 2019-05-21: 20 mg via ORAL
  Filled 2019-05-20: qty 1

## 2019-05-20 MED ORDER — DIPHENHYDRAMINE HCL 50 MG/ML IJ SOLN
25.00 mg | Freq: Once | INTRAMUSCULAR | Status: AC
Start: 2019-05-20 — End: 2019-05-20
  Administered 2019-05-20: 25 mg via INTRAVENOUS
  Filled 2019-05-20: qty 1

## 2019-05-20 MED ORDER — ATORVASTATIN CALCIUM 40 MG PO TABS
40.0000 mg | ORAL_TABLET | Freq: Every evening | ORAL | Status: DC
Start: 2019-05-20 — End: 2019-05-21

## 2019-05-20 MED ORDER — GADOBUTROL 1 MMOL/ML IV SOLN
10.00 mL | Freq: Once | INTRAVENOUS | Status: AC | PRN
Start: 2019-05-20 — End: 2019-05-20
  Administered 2019-05-20: 10 mmol via INTRAVENOUS

## 2019-05-20 MED ORDER — PROMETHAZINE HCL 25 MG/ML IJ SOLN
12.50 mg | Freq: Four times a day (QID) | INTRAMUSCULAR | Status: DC | PRN
Start: 2019-05-20 — End: 2019-05-21
  Administered 2019-05-20: 12.5 mg via INTRAVENOUS
  Filled 2019-05-20: qty 1

## 2019-05-20 MED ORDER — ASPIRIN 300 MG RE SUPP
150.00 mg | Freq: Once | RECTAL | Status: AC
Start: 2019-05-20 — End: 2019-05-20
  Administered 2019-05-20: 150 mg via RECTAL
  Filled 2019-05-20: qty 0.5

## 2019-05-20 MED ORDER — METHYLPREDNISOLONE SODIUM SUCC 125 MG IJ SOLR
125.00 mg | Freq: Once | INTRAMUSCULAR | Status: AC
Start: 2019-05-20 — End: 2019-05-20
  Administered 2019-05-20: 125 mg via INTRAVENOUS
  Filled 2019-05-20: qty 2

## 2019-05-20 MED ORDER — LORAZEPAM 2 MG/ML IJ SOLN
0.50 mg | Freq: Once | INTRAMUSCULAR | Status: AC
Start: 2019-05-20 — End: 2019-05-20
  Administered 2019-05-20: 0.5 mg via INTRAVENOUS
  Filled 2019-05-20: qty 1

## 2019-05-20 MED ORDER — APIXABAN 5 MG PO TABS
5.0000 mg | ORAL_TABLET | Freq: Two times a day (BID) | ORAL | Status: DC
Start: 2019-05-20 — End: 2019-05-20

## 2019-05-20 MED ORDER — LEVETIRACETAM 500 MG PO TABS
500.0000 mg | ORAL_TABLET | Freq: Two times a day (BID) | ORAL | Status: DC
Start: 2019-05-20 — End: 2019-05-20

## 2019-05-20 MED ORDER — BUDESONIDE-FORMOTEROL FUMARATE 80-4.5 MCG/ACT IN AERO
2.00 | INHALATION_SPRAY | Freq: Two times a day (BID) | RESPIRATORY_TRACT | Status: DC
Start: 2019-05-20 — End: 2019-05-21
  Filled 2019-05-20: qty 6.9

## 2019-05-20 MED ORDER — ROPINIROLE HCL 0.25 MG PO TABS
1.0000 mg | ORAL_TABLET | Freq: Every evening | ORAL | Status: DC
Start: 2019-05-20 — End: 2019-05-21

## 2019-05-20 MED ORDER — MORPHINE SULFATE 2 MG/ML IJ/IV SOLN (WRAP)
2.0000 mg | Status: DC | PRN
Start: 2019-05-20 — End: 2019-05-21
  Administered 2019-05-20 – 2019-05-21 (×3): 2 mg via INTRAVENOUS
  Filled 2019-05-20 (×3): qty 1

## 2019-05-20 MED ORDER — ACETAMINOPHEN 325 MG PO TABS
650.0000 mg | ORAL_TABLET | ORAL | Status: DC | PRN
Start: 2019-05-20 — End: 2019-05-21
  Filled 2019-05-20: qty 2

## 2019-05-20 MED ORDER — AMLODIPINE BESYLATE 5 MG PO TABS
2.5000 mg | ORAL_TABLET | Freq: Every day | ORAL | Status: DC
Start: 2019-05-20 — End: 2019-05-21

## 2019-05-20 MED ORDER — IODIXANOL 320 MG/ML IV SOLN
100.00 mL | Freq: Once | INTRAVENOUS | Status: AC
Start: 2019-05-20 — End: 2019-05-20
  Administered 2019-05-20: 100 mL via INTRAVENOUS

## 2019-05-20 MED ORDER — NON FORMULARY
40.00 mg | Freq: Every evening | Status: DC
Start: 2019-05-20 — End: 2019-05-21

## 2019-05-20 MED ORDER — BUTALBITAL-APAP-CAFFEINE 50-325-40 MG PO TABS
2.0000 | ORAL_TABLET | Freq: Once | ORAL | Status: DC
Start: 2019-05-20 — End: 2019-05-21
  Filled 2019-05-20: qty 2

## 2019-05-20 MED ORDER — SODIUM CHLORIDE 0.9 % IV BOLUS
1000.00 mL | Freq: Once | INTRAVENOUS | Status: AC
Start: 2019-05-20 — End: 2019-05-20
  Administered 2019-05-20: 1000 mL via INTRAVENOUS

## 2019-05-20 MED ORDER — QUETIAPINE FUMARATE 25 MG PO TABS
50.0000 mg | ORAL_TABLET | Freq: Every evening | ORAL | Status: DC
Start: 2019-05-20 — End: 2019-05-21

## 2019-05-20 MED ORDER — ASPIRIN 600 MG RE SUPP
300.00 mg | Freq: Once | RECTAL | Status: DC
Start: 2019-05-20 — End: 2019-05-20

## 2019-05-20 MED ORDER — LAMOTRIGINE 100 MG PO TABS
150.0000 mg | ORAL_TABLET | Freq: Two times a day (BID) | ORAL | Status: DC
Start: 2019-05-20 — End: 2019-05-21
  Administered 2019-05-21: 150 mg via ORAL
  Filled 2019-05-20: qty 2

## 2019-05-20 MED ORDER — BUTALBITAL-APAP-CAFFEINE 50-325-40 MG PO TABS
2.0000 | ORAL_TABLET | Freq: Three times a day (TID) | ORAL | Status: DC | PRN
Start: 2019-05-20 — End: 2019-05-21
  Administered 2019-05-21: 2 via ORAL
  Filled 2019-05-20: qty 2

## 2019-05-20 MED ORDER — METOPROLOL SUCCINATE ER 25 MG PO TB24
25.00 mg | ORAL_TABLET | Freq: Every day | ORAL | Status: DC
Start: 2019-05-20 — End: 2019-05-21
  Filled 2019-05-20: qty 1

## 2019-05-20 NOTE — Nursing Progress Note (Signed)
Patient just came in from MRI complaining of severe headache "worst headache she ever had in her life" VS taken 115/71. Heart rate 87. She thew up twice because of the headache. Page hospitalist. Failed dysphagia test, kept NPO. Some of her medication needs to be in IV form. Will contact hospitalist again.

## 2019-05-20 NOTE — Progress Notes (Addendum)
Contacted [facility] at 16:05  16:15 Dr. Jennye Moccasin called back and spoke with Dr. Sharlett Iles. No LVO.

## 2019-05-20 NOTE — H&P (Signed)
ADMISSION HISTORY AND PHYSICAL EXAM    [facility], DIVISION OF HOSPITALIST MEDICINE   [facility]   [place] Pager: [phone]      Date Time: 05/20/19 5:55 PM  Patient Name: [name]  Attending Physician: Suanne Marker, DO  Primary Care Physician: Malcolm Metro, MD    CC:  Slurred speech       Assessment:     Active Hospital Problems    Diagnosis   . Left-sided weakness       63 years old female with past medical history of atrial fibrillation on Eliquis, history of COPD, history of hypertension hyperlipidemia, TIA, conversion, asthma and anemia presented to the hospital with acute onset of slurred speech facial droop left-sided weakness associated with acute onset headache.    Plan:   -Admit to [facility] Hospitalist service    Acute new neurological symptom including acute new onset headache, slurred speech, right side facial droop and left-sided weakness  Patient has a history of chronic atrial fibrillation and had TIA in the past.  -CT scan of the head does not show any acute intracranial abnormality  -Brain MRI and head and neck MRA ordered  -SLP eval  -PT and OT ordered  -Continue Eliquis (no evidence of intracranial hemorrhage on CT)  -Check lipid panel  -Check hemoglobin A1c  -Echocardiogram ordered  Pt requests IV pain med for her headache , IV morphine ordered     History of paroxysmal atrial fibrillation  She takes metoprolol for rate control and she also takes Eliquis 5 mg twice a day for stroke prophylaxis      Chronic chest pain/angina  Patient has history of cardiac catheterization 2014 and 2017 with negative for coronary artery disease  Echocardiogram in 2019 showed normal ejection fraction and moderate aortic regurgitation  -Continue to take ranolazine and metoprolol and Lipitor    History of hyperlipidemia  Continue Lipitor    Patient has history of seizure disorder  Takes antiepileptic, will order    History of diabetes mellitus type 2  Diet controlled  We will obtain  hemoglobin A1c  We will place the patient on insulin sliding scale and fingerstick monitor    Previous history of GI bleeding  Monitor hemoglobin    Previous history of asthma  Also history of COPD  -Albuterol as needed      Disposition:     Anticipated medical stability for discharge:24-48h     History of Presenting Illness:   [name] is a 63 y.o. female who with past medical history of atrial fibrillation on Eliquis, history of COPD, history of hypertension hyperlipidemia, TIA, conversion, asthma and anemia presented to the hospital with acute onset of slurred speech facial droop left-sided weakness associated with acute onset headache.  Patient has history of paroxysmal atrial fibrillation and she takes Eliquis 5 mg twice daily.  Patient had history of TIA in the past also history of GI bleeding.  Patient has history of diabetic mellitus only diet controlled she does not take any medicine.  She also has a history of chronic chest pain and angina cardiac catheterization in the past not show any evidence of coronary artery disease.  She takes Lipitor metoprolol , ranolazine        Past Medical History:     Past Medical History:   Diagnosis Date   . Anemia    . Asthma    . Atrial fibrillation    . Chronic obstructive pulmonary disease    .  Convulsions    . Depression    . Diabetes mellitus     diet controlled    . Hyperlipidemia    . Hypertension    . TIA (transient ischemic attack)        Available old records reviewed, including: EPIC     Past Surgical History:     Past Surgical History:   Procedure Laterality Date   . CARDIAC CATHETERIZATION  2017   . HYSTERECTOMY     . pci         Family History:   History reviewed. No pertinent family history.    Social History:     Social History     Tobacco Use   Smoking Status Never Smoker   Smokeless Tobacco Never Used     Social History     Substance and Sexual Activity   Alcohol Use Never   . Frequency: Never     Social History     Substance and Sexual Activity   Drug  Use Never       Allergies:     Allergies   Allergen Reactions   . Singulair [Montelukast] Itching   . Myoview [Technetium-57m] Itching   . Contrast [Iodinated Diagnostic Agents]    . Latex    . Nitroglycerin    . Penicillins    . Tetracyclines & Related        Medications:     No current facility-administered medications on file prior to encounter.      Current Outpatient Medications on File Prior to Encounter   Medication Sig Dispense Refill   . acetaminophen (TYLENOL) 325 MG tablet Take 2 tablets (650 mg total) by mouth every 4 (four) hours as needed for Pain 50 tablet 0   . albuterol (PROVENTIL HFA) 108 (90 Base) MCG/ACT inhaler Inhale 1 puff into the lungs daily Also take it PRN every 20 minutes.     Marland Kitchen amLODIPine (NORVASC) 2.5 MG tablet Take 1 tablet (2.5 mg total) by mouth daily 30 tablet 0   . apixaban (ELIQUIS) 5 MG Take 1 tablet (5 mg total) by mouth every 12 (twelve) hours 30 tablet 0   . atorvastatin (LIPITOR) 40 MG tablet Take 40 mg by mouth daily     . budesonide-formoterol (SYMBICORT) 80-4.5 MCG/ACT inhaler Inhale 2 puffs into the lungs daily     . furosemide (LASIX) 20 MG tablet Take 1 tablet (20 mg total) by mouth daily 30 tablet 0   . lactobacillus/streptococcus (RISAQUAD) Cap Take 1 capsule by mouth daily 3 capsule 0   . lamoTRIgine (LAMICTAL) 150 MG tablet Take 1 tablet (150 mg total) by mouth 2 (two) times daily 60 tablet 0   . levETIRAcetam (KEPPRA) 500 MG tablet Take 500 mg by mouth 2 (two) times daily     . Lurasidone HCl (Latuda) 40 MG Tab Take 40 mg by mouth nightly     . metoprolol succinate XL (TOPROL-XL) 25 MG 24 hr tablet Take 25 mg by mouth daily     . pantoprazole (PROTONIX) 40 MG tablet Take 1 tablet (40 mg total) by mouth daily 30 tablet 0   . psyllium (METAMUCIL) 58.12 % Pack packet Take 1 packet by mouth daily 30 packet 0   . QUEtiapine (SEROQUEL) 50 MG tablet Take 1 tablet (50 mg total) by mouth nightly 30 tablet 0   . ranolazine (RANEXA) 1000 MG SR tablet Take 1 tablet (1,000 mg  total) by mouth 2 (two) times daily 60 tablet 0   .  rOPINIRole (REQUIP) 1 MG tablet Take 1 tablet (1 mg total) by mouth nightly 30 tablet 0       Review of Systems:   All other systems were reviewed and are negative except:   Review of Systems - Respiratory ROS: no cough, shortness of breath, or wheezing  Cardiovascular ROS: no chest pain or dyspnea on exertion  Gastrointestinal ROS: no abdominal pain, change in bowel habits, or black or bloody stools  Genito-Urinary ROS: no dysuria, trouble voiding, or hematuria  Musculoskeletal ROS: positive for - left arm weakness   Dermatological ROS: negative    Physical Exam:     Patient Vitals for the past 24 hrs:   BP Temp Temp src Pulse Resp SpO2 Height Weight   05/20/19 1701 133/75 - - 74 - 100 % - -   05/20/19 1615 141/61 - - 75 20 99 % - -   05/20/19 1533 134/56 99.1 F (37.3 C) Oral 87 16 98 % 1.651 m (5\' 5" ) 101.2 kg (223 lb 1.7 oz)   05/20/19 1523 134/56 - - 85 - 99 % - -     Body mass index is 37.13 kg/m.  No intake or output data in the 24 hours ending 05/20/19 1755   pt seen in MRI hallway   General: awake; no acute distress. Wearing face mask , On my exam facial droop is  Minimum   HEENT: perrla, eomi, sclera anicteric  oropharynx clear without lesions, mucous membranes moist  Neck: supple, no lymphadenopathy, no JVD, no carotid bruits  Cardiovascular: Normal S1 and S2, no murmurs, rubs or gallops  Lungs: clear to auscultation bilaterally, without wheezing, rhonchi, or rales  Abdomen: soft, non-tender, non-distended; no palpable masses, no hepatosplenomegaly, normoactive bowel sounds, no rebound or guarding  Extremities: no clubbing, cyanosis, or edema  Neuro: alert, oriented x 3, cranial nerves grossly intact, strength 4/5 Left UE and 5/5 in Right  upper and lower extremities, sensation intact, speech is fluent   Skin: no rashes or lesions noted        Labs:     Results     Procedure Component Value Units Date/Time    Troponin I [[id number]] Collected:  05/20/19  1541    Specimen:  Blood Updated:  05/20/19 1728     Troponin I 0.02 ng/mL     Comprehensive metabolic panel [[id number]]  (Abnormal) Collected:  05/20/19 1541    Specimen:  Blood Updated:  05/20/19 1723     Glucose 91 mg/dL      BUN 6 mg/dL      Creatinine 0.9 mg/dL      Sodium 782 mEq/L      Potassium 3.5 mEq/L      Chloride 107 mEq/L      CO2 25 mEq/L      Calcium 9.1 mg/dL      Protein, Total 6.8 g/dL      Albumin 3.8 g/dL      AST (SGOT) 13 U/L      ALT 11 U/L      Alkaline Phosphatase 70 U/L      Bilirubin, Total 0.3 mg/dL      Globulin 3.0 g/dL      Albumin/Globulin Ratio 1.3     Anion Gap 11.0    GFR [[id number]] Collected:  05/20/19 1541     Updated:  05/20/19 1723     EGFR >60.0    APTT [[id number]]  (Abnormal) Collected:  05/20/19 1541     Updated:  05/20/19  1712     PTT 41 sec     Prothrombin time/INR [[id number]] Collected:  05/20/19 1541    Specimen:  Blood Updated:  05/20/19 1711     PT 13.8 sec      PT INR 1.1    CBC and differential [[id number]]  (Abnormal) Collected:  05/20/19 1541    Specimen:  Blood Updated:  05/20/19 1703     WBC 6.70 x10 3/uL      Hgb 11.1 g/dL      Hematocrit 78.2 %      Platelets 305 x10 3/uL      RBC 3.95 x10 6/uL      MCV 85.8 fL      MCH 28.1 pg      MCHC 32.7 g/dL      RDW 13 %      MPV 9.6 fL      Neutrophils 47.3 %      Lymphocytes Automated 37.3 %      Monocytes 8.7 %      Eosinophils Automated 5.7 %      Basophils Automated 0.7 %      Immature Granulocytes 0.3 %      Nucleated RBC 0.0 /100 WBC      Neutrophils Absolute 3.17 x10 3/uL      Lymphocytes Absolute Automated 2.50 x10 3/uL      Monocytes Absolute Automated 0.58 x10 3/uL      Eosinophils Absolute Automated 0.38 x10 3/uL      Basophils Absolute Automated 0.05 x10 3/uL      Immature Granulocytes Absolute 0.02 x10 3/uL      Absolute NRBC 0.00 x10 3/uL     Glucose Whole Blood - POCT [[id number]] Collected:  05/20/19 1511     Updated:  05/20/19 1512     Whole Blood Glucose POCT 96 mg/dL         EKG NSR ar 78 pbm    Imaging personally reviewed, including:   Cta  Head And Neck    Result Date: 05/20/2019   Diminished opacification of the M3 and M4 branches of the right middle artery suggestive of occlusion of these vessels. Merri Ray, MD  05/20/2019 5:06 PM    Ct Head Without Contrast    Result Date: 05/20/2019    No acute intracranial abnormality.  Other findings as above. Gustavus Messing, MD  05/20/2019 3:37 PM              Signed by: Theresia Lo, MD   cc:Malcolm Metro, MD

## 2019-05-20 NOTE — Consults (Signed)
NEUROLOGY CONSULTATION    Date Time: 05/20/19 4:42 PM  Patient Name: [name]  Attending Physician: Suanne Marker, DO      Assessment & Plan:   Sudden onset of left-sided weakness facial droop and headache, unclear diagnosis at this point initial imaging studies are negative?  New CVA  History of A. fib on Eliquis patient is compliant and took her morning dose as well   MRI of the brain   Remain on Eliquis   Fioricet as needed for headache   Patient states she is allergic to magnesium   Will give Benadryl for headache in the meantime   We will follow-up in a.m.    History of Present Illness:   Patient is a 63 year old lady, with a history of seizures, diabetes atrial fibrillation COPD, comes into the hospital for new onset of left-sided facial weakness drooping left arm and leg weakness, and severe headache patient states her symptoms began when she was watching TV CT head is negative CT Angie head and neck do not show any large vessel occlusion official report is still pending      She is not a candidate for TPA, or any other intervention she reports no prior history of stroke    Past Medical History:     Past Medical History:   Diagnosis Date   . Anemia    . Asthma    . Atrial fibrillation    . Chronic obstructive pulmonary disease    . Convulsions    . Depression    . Diabetes mellitus     diet controlled    . Hyperlipidemia    . Hypertension    . TIA (transient ischemic attack)        Meds:    Eliquis Tylenol Norvasc Lipitor Lamictal Keppra Latuda metoprolol Protonix Metamucil Seroquel Ranexa Requip      Allergies   Allergen Reactions   . Singulair [Montelukast] Itching   . Myoview [Technetium-78m] Itching   . Latex    . Nitroglycerin    . Penicillins    . Tetracyclines & Related        Social & Family History:     Social History     Socioeconomic History   . Marital status: Divorced     Spouse name: Not on file   . Number of children: Not on file   . Years of education: Not on file   . Highest  education level: Not on file   Occupational History   . Not on file   Social Needs   . Financial resource strain: Not on file   . Food insecurity     Worry: Not on file     Inability: Not on file   . Transportation needs     Medical: Not on file     Non-medical: Not on file   Tobacco Use   . Smoking status: Never Smoker   . Smokeless tobacco: Never Used   Substance and Sexual Activity   . Alcohol use: Never     Frequency: Never   . Drug use: Never   . Sexual activity: Not on file   Lifestyle   . Physical activity     Days per week: Not on file     Minutes per session: Not on file   . Stress: Not on file   Relationships   . Social Wellsite geologist on phone: Not on file     Gets together: Not on file  Attends religious service: Not on file     Active member of club or organization: Not on file     Attends meetings of clubs or organizations: Not on file     Relationship status: Not on file   . Intimate partner violence     Fear of current or ex partner: Not on file     Emotionally abused: Not on file     Physically abused: Not on file     Forced sexual activity: Not on file   Other Topics Concern   . Not on file   Social History Narrative   . Not on file     History reviewed. No pertinent family history.        CODE STATUS:     Review of Systems:   Severe headache, blurred vision in the left eye no, ear nose, throat problems; no coughing or wheezing or shortness of breath, No chest pain or orthopnea, no abdominal pain, nausea or vomiting, No pain in the body or extremities, no psychiatric, neurological, endocrine, hematological or cardiac complaints except as noted above.     Physical Exam:   Blood pressure 141/61, pulse 75, temperature 99.1 F (37.3 C), temperature source Oral, resp. rate 20, height 1.651 m (5\' 5" ), weight 101.2 kg (223 lb 1.7 oz), SpO2 99 %.    HEENT: Normocephalic.no carotid bruits  Lungs:  CTA bil  Abd Soft   Cardiac:  S1,S2, normal rate and rhythm  Neck: supple, no cartoid  bruits  Extremities: no edema  Skin: no rashes seen in exposed areas     Neuro:  Level of consciousness:  Alert and appropriate  Oriented:  X 3  Cognition:  Intact naming, recognition, concentration and following complex commands  Cranial Nerves: \Pupils equal reactive eye movements intact, left-sided facial weakness noted, seems nonphysiological tongue deviates to the right moving both arms and legs well, except for some mild weakness in the left arm and left leg reduced light touch temperature in the left arm and left leg reflexes are 1+    Labs:         Invalid input(s): TBILI          No results for input(s): PTT, PT, INR in the last 72 hours.       Radiology Results (24 Hour)     Procedure Component Value Units Date/Time    CTA  Head and Neck [[id number]] Resulted:  05/20/19 1605    Order Status:  Sent Updated:  05/20/19 1608    CT Head Without Contrast [[id number]] Collected:  05/20/19 1525    Order Status:  Completed Updated:  05/20/19 1539    Narrative:       INDICATION:  Slurred speech, left-sided weakness,    TECHNIQUE: Axial CT scans of the head was performed from the skull base  to the vertex without IV contrast.  A combination of automatic exposure  control and adjustment  of the mA and/or kV according to patient size  was utilized.    COMPARISON: 01/06/2019.    FINDINGS: There is no midline shift or acute hemorrhage.  There is  prominence of the ventricles and sulci, likely reflecting age related  cortical involutional changes.  Scattered foci of hypoattenuation within  the periventricular and subcortical white matter bilaterally is  nonspecific, but most commonly represents chronic small vessel ischemic  changes. There is no evidence of acute large territorial infarction.  There is no extra-axial collection.    Visualized paranasal sinuses, mastoid  and ethmoid air cells are clear.   No focal bony abnormality is seen.      Impression:         No acute intracranial abnormality.  Other findings  as  above.    Gustavus Messing, MD   05/20/2019 3:37 PM           All recent brain and spine imaging (MRI, CT) personally reviewed.    Chart reviewed    Case discussed with: patient  ED      This note was generated by the [employer] EMR system/Speech recognition and may contain inherent errors or omissions not intended by the user. Grammatical errors, random word insertions, deletions and pronoun errors  are occasional consequences of this technology due to software limitations.   Not all errors are caught or corrected. If there are questions or concerns about the content of this note or information contained within the body of this dictation they should be addressed directly with the author for clarification.    Signed by: Cathe Mons, MD  Spectralink: [phone]      Answering Service: [phone]

## 2019-05-20 NOTE — ED Provider Notes (Signed)
EMERGENCY DEPARTMENT NOTE    None        HISTORY OF PRESENT ILLNESS   Historian:patient  Translator Used: No    Chief Complaint: Facial Droop     Mechanism of Injury:       63 y.o. female presents emergency department with a chief complaint of acute onset headache with slurred speech and facial droop and left-sided weakness.  Patient reports symptoms started 40 minutes ago.  She had acute onset of severe headache.  She also reports associated right-sided facial droop and left arm and left leg weakness.  She has a history of TIAs.  She also has history of atrial fibrillation.  She is currently on Eliquis.  Last dose was within the past 48 hours.  She denies any chest pain or shortness of breath.  No abdominal pain or nausea or vomiting.  She has had multiple TIAs in the past but never had TPA.  No other complaints at this time.    1. Location of symptoms: Head, left side symptoms  2. Onset of symptoms: 40 minutes prior to arrival  3. What was patient doing when symptoms started (Context): see above  4. Severity: Moderate to severe  5. Timing: Ongoing  6. Activities that worsen symptoms: Unknown  7. Activities that improve symptoms: Nothing so far  8. Quality: Weakness, achiness in the head  9. Radiation of symptoms: No  10. Associated signs and Symptoms: see above  11. Are symptoms worsening?  Yes  MEDICAL HISTORY     Past Medical History:  Past Medical History:   Diagnosis Date   . Anemia    . Asthma    . Atrial fibrillation    . Chronic obstructive pulmonary disease    . Convulsions    . Depression    . Diabetes mellitus     diet controlled    . Hyperlipidemia    . Hypertension    . TIA (transient ischemic attack)        Past Surgical History:  Past Surgical History:   Procedure Laterality Date   . CARDIAC CATHETERIZATION  2017   . HYSTERECTOMY     . pci         Social History:  Social History     Socioeconomic History   . Marital status: Divorced     Spouse name: Not on file   . Number of children: Not on file   .  Years of education: Not on file   . Highest education level: Not on file   Occupational History   . Not on file   Social Needs   . Financial resource strain: Not on file   . Food insecurity     Worry: Not on file     Inability: Not on file   . Transportation needs     Medical: Not on file     Non-medical: Not on file   Tobacco Use   . Smoking status: Never Smoker   . Smokeless tobacco: Never Used   Substance and Sexual Activity   . Alcohol use: Never     Frequency: Never   . Drug use: Never   . Sexual activity: Not on file   Lifestyle   . Physical activity     Days per week: Not on file     Minutes per session: Not on file   . Stress: Not on file   Relationships   . Social Wellsite geologist on phone: Not  on file     Gets together: Not on file     Attends religious service: Not on file     Active member of club or organization: Not on file     Attends meetings of clubs or organizations: Not on file     Relationship status: Not on file   . Intimate partner violence     Fear of current or ex partner: Not on file     Emotionally abused: Not on file     Physically abused: Not on file     Forced sexual activity: Not on file   Other Topics Concern   . Not on file   Social History Narrative   . Not on file       Family History:  History reviewed. No pertinent family history.    Outpatient Medication:  Current Discharge Medication List      CONTINUE these medications which have NOT CHANGED    Details   acetaminophen (TYLENOL) 325 MG tablet Take 2 tablets (650 mg total) by mouth every 4 (four) hours as needed for Pain  Qty: 50 tablet, Refills: 0      albuterol (PROVENTIL HFA) 108 (90 Base) MCG/ACT inhaler Inhale 1 puff into the lungs daily Also take it PRN every 20 minutes.      amLODIPine (NORVASC) 2.5 MG tablet Take 1 tablet (2.5 mg total) by mouth daily  Qty: 30 tablet, Refills: 0      apixaban (ELIQUIS) 5 MG Take 1 tablet (5 mg total) by mouth every 12 (twelve) hours  Qty: 30 tablet, Refills: 0      atorvastatin  (LIPITOR) 40 MG tablet Take 40 mg by mouth daily      budesonide-formoterol (SYMBICORT) 80-4.5 MCG/ACT inhaler Inhale 2 puffs into the lungs daily      furosemide (LASIX) 20 MG tablet Take 1 tablet (20 mg total) by mouth daily  Qty: 30 tablet, Refills: 0      lactobacillus/streptococcus (RISAQUAD) Cap Take 1 capsule by mouth daily  Qty: 3 capsule, Refills: 0      lamoTRIgine (LAMICTAL) 150 MG tablet Take 1 tablet (150 mg total) by mouth 2 (two) times daily  Qty: 60 tablet, Refills: 0      levETIRAcetam (KEPPRA) 500 MG tablet Take 500 mg by mouth 2 (two) times daily      LORazepam (Ativan) 0.5 MG tablet Take 0.5 mg by mouth daily as needed for Anxiety      Lurasidone HCl (Latuda) 40 MG Tab Take 40 mg by mouth nightly      metoprolol succinate XL (TOPROL-XL) 25 MG 24 hr tablet Take 25 mg by mouth daily      pantoprazole (PROTONIX) 40 MG tablet Take 1 tablet (40 mg total) by mouth daily  Qty: 30 tablet, Refills: 0      psyllium (METAMUCIL) 58.12 % Pack packet Take 1 packet by mouth daily  Qty: 30 packet, Refills: 0      QUEtiapine (SEROQUEL) 50 MG tablet Take 1 tablet (50 mg total) by mouth nightly  Qty: 30 tablet, Refills: 0      ranolazine (RANEXA) 1000 MG SR tablet Take 1 tablet (1,000 mg total) by mouth 2 (two) times daily  Qty: 60 tablet, Refills: 0      rOPINIRole (REQUIP) 1 MG tablet Take 1 tablet (1 mg total) by mouth nightly  Qty: 30 tablet, Refills: 0               REVIEW OF  SYSTEMS   Review of Systems   Constitutional: Negative for fever.   HENT: Negative for sore throat.    Eyes: Negative for pain.   Respiratory: Negative for cough and shortness of breath.    Cardiovascular: Negative for chest pain.   Gastrointestinal: Negative for abdominal pain, nausea and vomiting.   Genitourinary: Negative for dysuria.   Musculoskeletal: Negative for falls.   Skin: Negative for rash.   Neurological: Positive for focal weakness and headaches. Negative for loss of consciousness.   Psychiatric/Behavioral: Negative for  memory loss.   All other systems reviewed and are negative.             PHYSICAL EXAM     ED Triage Vitals   Enc Vitals Group      BP       Pulse       Resp       Temp       Temp src       SpO2       Weight       Height       Head Circumference       Peak Flow       Pain Score       Pain Loc       Pain Edu?       Excl. in GC?      Nursing note and vitals reviewed.  Constitutional:  Well developed, well nourished. alert and awake  Head:  Atraumatic. Normocephalic.    Eyes:  PERRL. EOMI. No scleral icterus  ENT:  Mucous membranes are moist and intact. Oropharynx is clear.  External ears normal. Patent airway.  Right-sided facial droop  Neck:  Supple. Full ROM.    Cardiovascular:  Regular rate. Regular rhythm. No murmurs, rubs, or gallops.  Pulmonary/Chest:  No evidence of respiratory distress. Clear to auscultation bilaterally.  No wheezing, rales or rhonchi.   Abdominal:  Soft and non-distended. No tenderness. No rebound, guarding, or rigidity.  Back:  Full ROM. Nontender.   Extremities:  No edema. No cyanosis. Full range of motion in all extremities.  Skin:  Skin is warm and dry.  No diaphoresis. No rash.   Neurological:  Alert, awake, and appropriate.  Slurred speech, able to bear weight but once again patient was unable to lift her left leg off the bed without drift.  3 out of 5 strength in the left hand when compared to the right.  Right-sided facial droop.  Psychiatric:  Good eye contact. Normal interaction, affect, and behavior.          MEDICAL DECISION MAKING     DISCUSSION    Patient presents for evaluation of strokelike symptoms.  Patient also had acute onset of severe headache.  Patient will have emergent imaging studies performed and laboratory studies.  Patient is not a TPA candidate because she recently took Eliquis.  Paging neurology at 1517.  Patient has an NIH stroke score of 8.  Because she is on Eliquis she is not a TPA candidate.  Laboratory studies were obtained and patient was sent for CT  angiogram of the head and neck.  Initial report was that patient does not have any large vessel occlusion.  Later report came out the patient had M3 and M4 branch occlusion.  Discussed this case with interventional radiology reports that this is too far distal in order to be amenable to any sort of intervention.  Patient will have medical management.  She failed  swallow screen and therefore was given rectal aspirin.  Discussed with neurology further recommends continued medical management.  Discussed case with hospitalist who agrees to admit patient for further work-up and evaluation.  Patient also in agreement.  She continues to have facial droop and left-sided weakness.    ED Course as of May 21 1219   Mon May 20, 2019   1626 Discussed with Dr. Francesco Sor who agrees with admission and will see pt in the ED.     [NK]   1634 81 mg aspirin given per Dr. Francesco Sor.     [NK]   1734 Discussed with Dr. Caleb Popp he reports that there is nothing actionable on the CT angiogram of the head that is amenable to IR.    [NK]      ED Course User Index  [NK] Suanne Marker, DO         NIH Stroke Score      Most Recent Value   Patient's calculated Stroke Score:  8 filed at 05/20/2019 1857      Alteplase Contraindications:      Most Recent Value   Class III tPA Exclusion Criteria: do NOT give tPA   Apixaban (Eliquis), dabigatran (Pradaxa), fondaparinux (Arixtra), rivaroxaban (Xarelto), or any anti-coagulant (except warfarin; see above) within 48 hours  Yes   Class IIb tPA Exclusion Criteria: tPA may be considered only in carefully selected patients with these conditions        Vital Signs: Reviewed the patient?s vital signs.   Nursing Notes: Reviewed and utilized available nursing notes.  Medical Records Reviewed: Reviewed available past medical records.  Counseling: The emergency provider has spoken with the patient and discussed today?s findings, in addition to providing specific details for the plan of care.  Questions are answered  and there is agreement with the plan.        CARDIAC STUDIES    The following cardiac studies were independently interpreted by the Emergency Medicine Physician.  For full cardiac study results please see chart.        EKG Interpretation:  Signed and interpreted byED Physician   Time Interpreted: 1542  Comparison: 05/12/2019 prolonged QT interval is no longer identified on today's exam otherwise no significant acute interval changes when compared with previous EKG  Rate: 78  Rhythm: NSR  Axis: normal  Intervals: normal  Blocks: None  ST segments: Nonspecific ST/T wave changes with inverted T waves noted in V1, V2, V3 and flat T wave in 3, aVF, lead I  Interpretation: Nonspecific  EKG        RADIOLOGY IMAGING STUDIES      MRI Brain W WO Contrast   Final Result    Mild microvascular ischemic changes. No acute infarct is   demonstrated.      Merri Ray, MD    05/20/2019 8:52 PM      CTA  Head and Neck   Final Result    Diminished opacification of the M3 and M4 branches of the   right middle artery suggestive of occlusion of these vessels.      Merri Ray, MD    05/20/2019 5:06 PM      CT Head Without Contrast   Final Result     No acute intracranial abnormality.  Other findings as   above.      Gustavus Messing, MD    05/20/2019 3:37 PM              PULSE OXIMETRY    Oxygen  Saturation by Pulse Oximetry: 100%  Interventions: none  Interpretation: Normal    EMERGENCY DEPT. MEDICATIONS      ED Medication Orders (From admission, onward)    Start Ordered     Status Ordering Provider    05/20/19 1820 05/20/19 1819  LORazepam (ATIVAN) injection 0.5 mg  Once     Route: Intravenous  Ordered Dose: 0.5 mg     Last MAR action:  Given TAJIK, SOOPHI    05/20/19 1754 05/20/19 1753    Daily     Route: Subcutaneous  Ordered Dose: 40 mg     Discontinued TAJIK, SOOPHI    05/20/19 1752 05/20/19 1753  acetaminophen (TYLENOL) tablet 650 mg  Every 6 hours PRN     Route: Oral  Ordered Dose: 650 mg     Acknowledged TAJIK, SOOPHI    05/20/19 1750  05/20/19 1753  naloxone (NARCAN) injection 0.2 mg  As needed     Route: Intravenous  Ordered Dose: 0.2 mg     Acknowledged TAJIK, SOOPHI    05/20/19 1727 05/20/19 1726  diphenhydrAMINE (BENADRYL) injection 25 mg  Once     Route: Intravenous  Ordered Dose: 25 mg     Last MAR action:  Given Jeshua Ransford    05/20/19 1649 05/20/19 1649    Every 6 hours PRN     Route: Intravenous  Ordered Dose: 12.5 mg     Discontinued Cathe Mons    05/20/19 1648 05/20/19 1648  butalbital-acetaminophen-caffeine (FIORICET) 50-325-40 MG per tablet 2 tablet  Every 8 hours PRN     Route: Oral  Ordered Dose: 2 tablet     Last MAR action:  Given Cathe Mons    05/20/19 1637 05/20/19 1636  promethazine (PHENERGAN) 12.5 mg in sodium chloride 0.9 % 50 mL IVPB  Once     Route: Intravenous  Ordered Dose: 12.5 mg     Last MAR action:  Given Buffy Ehler    05/20/19 1637 05/20/19 1636  sodium chloride 0.9 % bolus 1,000 mL  Once     Route: Intravenous  Ordered Dose: 1,000 mL     Last MAR action:  New Bag Elsi Stelzer    05/20/19 1636 05/20/19 1635    Once     Route: Rectal  Ordered Dose: 300 mg     Discontinued Odyssey Vasbinder    05/20/19 1636 05/20/19 1636  aspirin suppository 150 mg  Once     Route: Rectal  Ordered Dose: 150 mg     Last MAR action:  Given Evonda Enge    05/20/19 1635 05/20/19 1634    Once in ED     Route: Oral  Ordered Dose: 81 mg     Discontinued Georgene Kopper    05/20/19 1618 05/20/19 1617  butalbital-acetaminophen-caffeine (FIORICET) 50-325-40 MG per tablet 2 tablet  Once     Route: Oral  Ordered Dose: 2 tablet     Last MAR action:  Not Given Raela Bohl    05/20/19 1559 05/20/19 1607  iodixanol (VISIPAQUE) 320 MG/ML injection 100 mL  Once     Route: Intravenous  Ordered Dose: 100 mL     Last MAR action:  Imaging Agent Given Bethann Berkshire D    05/20/19 1557 05/20/19 1556  methylPREDNISolone sodium succinate (Solu-MEDROL) injection 125 mg  Once     Route: Intravenous  Ordered Dose: 125 mg     Last  MAR action:  Given Zacharius Funari    05/20/19 1556 05/20/19  1555  diphenhydrAMINE (BENADRYL) injection 25 mg  Once     Route: Intravenous  Ordered Dose: 25 mg     Last MAR action:  Given Kanyon Seibold          LABORATORY RESULTS    Ordered and independently interpreted AVAILABLE laboratory tests. Please see results section in chart for full details.  Results for orders placed or performed during the hospital encounter of 05/20/19   CBC and differential   Result Value Ref Range    WBC 6.70 3.10 - 9.50 x10 3/uL    Hgb 11.1 (L) 11.4 - 14.8 g/dL    Hematocrit 16.1 (L) 34.7 - 43.7 %    Platelets 305 142 - 346 x10 3/uL    RBC 3.95 3.90 - 5.10 x10 6/uL    MCV 85.8 78.0 - 96.0 fL    MCH 28.1 25.1 - 33.5 pg    MCHC 32.7 31.5 - 35.8 g/dL    RDW 13 11 - 15 %    MPV 9.6 8.9 - 12.5 fL    Neutrophils 47.3 None %    Lymphocytes Automated 37.3 None %    Monocytes 8.7 None %    Eosinophils Automated 5.7 None %    Basophils Automated 0.7 None %    Immature Granulocytes 0.3 None %    Nucleated RBC 0.0 0.0 - 0.0 /100 WBC    Neutrophils Absolute 3.17 1.10 - 6.33 x10 3/uL    Lymphocytes Absolute Automated 2.50 0.42 - 3.22 x10 3/uL    Monocytes Absolute Automated 0.58 0.21 - 0.85 x10 3/uL    Eosinophils Absolute Automated 0.38 0.00 - 0.44 x10 3/uL    Basophils Absolute Automated 0.05 0.00 - 0.08 x10 3/uL    Immature Granulocytes Absolute 0.02 0.00 - 0.07 x10 3/uL    Absolute NRBC 0.00 0.00 - 0.00 x10 3/uL   Comprehensive metabolic panel   Result Value Ref Range    Glucose 91 70 - 100 mg/dL    BUN 6 (L) 7 - 19 mg/dL    Creatinine 0.9 0.6 - 1.0 mg/dL    Sodium 096 045 - 409 mEq/L    Potassium 3.5 3.5 - 5.1 mEq/L    Chloride 107 100 - 111 mEq/L    CO2 25 22 - 29 mEq/L    Calcium 9.1 8.5 - 10.5 mg/dL    Protein, Total 6.8 6.0 - 8.3 g/dL    Albumin 3.8 3.5 - 5.0 g/dL    AST (SGOT) 13 5 - 34 U/L    ALT 11 0 - 55 U/L    Alkaline Phosphatase 70 37 - 106 U/L    Bilirubin, Total 0.3 0.2 - 1.2 mg/dL    Globulin 3.0 2.0 - 3.6 g/dL     Albumin/Globulin Ratio 1.3 0.9 - 2.2    Anion Gap 11.0 5.0 - 15.0   Prothrombin time/INR   Result Value Ref Range    PT 13.8 12.6 - 15.0 sec    PT INR 1.1 0.9 - 1.1   APTT   Result Value Ref Range    PTT 41 (H) 23 - 37 sec   Troponin I   Result Value Ref Range    Troponin I 0.02 0.00 - 0.05 ng/mL   GFR   Result Value Ref Range    EGFR >60.0    Lipid panel   Result Value Ref Range    Cholesterol 170 0 - 199 mg/dL    Triglycerides 52 34 - 149 mg/dL  HDL 58 40 - 9,999 mg/dL    LDL Calculated 161 (H) 0 - 99 mg/dL    VLDL Calculated 10 10 - 40 mg/dL    Cholesterol / HDL Ratio 2.9 See Below   Hemoglobin A1C   Result Value Ref Range    Hemoglobin A1C 6.0 (H) 4.6 - 5.9 %    Average Estimated Glucose 125.5 mg/dL   Hemolysis index   Result Value Ref Range    Hemolysis Index 13 0 - 18   Basic Metabolic Panel   Result Value Ref Range    Glucose 118 (H) 70 - 100 mg/dL    BUN 11 7 - 19 mg/dL    Creatinine 0.8 0.6 - 1.0 mg/dL    Calcium 8.7 8.5 - 09.6 mg/dL    Sodium 045 409 - 811 mEq/L    Potassium 3.8 3.5 - 5.1 mEq/L    Chloride 110 100 - 111 mEq/L    CO2 23 22 - 29 mEq/L    Anion Gap 11.0 5.0 - 15.0   CBC without differential   Result Value Ref Range    WBC 7.02 3.10 - 9.50 x10 3/uL    Hgb 10.4 (L) 11.4 - 14.8 g/dL    Hematocrit 91.4 (L) 34.7 - 43.7 %    Platelets 294 142 - 346 x10 3/uL    RBC 3.75 (L) 3.90 - 5.10 x10 6/uL    MCV 86.1 78.0 - 96.0 fL    MCH 27.7 25.1 - 33.5 pg    MCHC 32.2 31.5 - 35.8 g/dL    RDW 14 11 - 15 %    MPV 9.6 8.9 - 12.5 fL    Nucleated RBC 0.0 0.0 - 0.0 /100 WBC    Absolute NRBC 0.00 0.00 - 0.00 x10 3/uL   GFR   Result Value Ref Range    EGFR >60.0    Glucose Whole Blood - POCT   Result Value Ref Range    Whole Blood Glucose POCT 96 70 - 100 mg/dL   ECG 12 lead   Result Value Ref Range    Ventricular Rate 78 BPM    Atrial Rate 78 BPM    P-R Interval 152 ms    QRS Duration 84 ms    Q-T Interval 408 ms    QTC Calculation (Bezet) 465 ms    P Axis 34 degrees    R Axis 40 degrees    T Axis 42 degrees        CRITICAL CARE/PROCEDURES    Critical Care  Performed by: Suanne Marker, DO  Authorized by: Richarda Osmond, MD     Critical care provider statement:     Critical care time (minutes):  45    Critical care time was exclusive of:  Separately billable procedures and treating other patients    Critical care was necessary to treat or prevent imminent or life-threatening deterioration of the following conditions:  CNS failure or compromise    Critical care was time spent personally by me on the following activities:  Development of treatment plan with patient or surrogate, discussions with consultants, evaluation of patient's response to treatment, examination of patient, obtaining history from patient or surrogate, review of old charts, re-evaluation of patient's condition, pulse oximetry, ordering and review of radiographic studies, ordering and review of laboratory studies and ordering and performing treatments and interventions  Comments:      Consider TPA for treatment of patient's symptoms.  Also considered multiple other interventional radiology interventions.  Discussed with multiple consultants.  Reevaluate the patient to assess for worsening clinical decline.      CRITICAL CARE: The high probability of sudden, clinically significant deterioration in the patient's condition required the highest level of my preparedness to intervene urgently.    The services I provided to this patient were to treat and/or prevent clinically significant deterioration that could result in: worsening stroke, death, permanent disability.  Services included the following: chart data review, reviewing nursing notes and/or old charts, documentation time, consultant collaboration regarding findings and treatment options, medication orders and management, direct patient care, re-evaluations, vital sign assessments and ordering, interpreting and reviewing diagnostic studies/lab tests.    Aggregate critical care time was 45 minutes, which  includes only time during which I was engaged in work directly related to the patient's care, as described above, whether at the bedside or elsewhere in the Emergency Department.  It did not include time spent performing other reported procedures or the services of residents, students, nurses or physician assistants.      DIAGNOSIS      Diagnosis:  Final diagnoses:   Left-sided weakness   Slurred speech   Cerebral artery occlusion       Disposition:  ED Disposition     ED Disposition Condition Date/Time Comment    Observation  Mon May 20, 2019  4:58 PM Admitting Physician: Cheree Ditto [facility] [[id number]]   Diagnosis: Left-sided weakness [604540]   Estimated Length of Stay: < 2 midnights   Tentative Discharge Plan?: Home or Self Care [1]   Patient Class: Observation [104]            Prescriptions:  Current Discharge Medication List      START taking these medications    Details   butalbital-acetaminophen-caffeine (FIORICET) 50-325-40 MG per tablet Take 1 tablet by mouth every 6 (six) hours as needed for Headaches  Qty: 30 tablet, Refills: 0         CONTINUE these medications which have NOT CHANGED    Details   acetaminophen (TYLENOL) 325 MG tablet Take 2 tablets (650 mg total) by mouth every 4 (four) hours as needed for Pain  Qty: 50 tablet, Refills: 0      albuterol (PROVENTIL HFA) 108 (90 Base) MCG/ACT inhaler Inhale 1 puff into the lungs daily Also take it PRN every 20 minutes.      amLODIPine (NORVASC) 2.5 MG tablet Take 1 tablet (2.5 mg total) by mouth daily  Qty: 30 tablet, Refills: 0      apixaban (ELIQUIS) 5 MG Take 1 tablet (5 mg total) by mouth every 12 (twelve) hours  Qty: 30 tablet, Refills: 0      atorvastatin (LIPITOR) 40 MG tablet Take 40 mg by mouth daily      budesonide-formoterol (SYMBICORT) 80-4.5 MCG/ACT inhaler Inhale 2 puffs into the lungs daily      furosemide (LASIX) 20 MG tablet Take 1 tablet (20 mg total) by mouth daily  Qty: 30 tablet, Refills: 0      lactobacillus/streptococcus (RISAQUAD)  Cap Take 1 capsule by mouth daily  Qty: 3 capsule, Refills: 0      lamoTRIgine (LAMICTAL) 150 MG tablet Take 1 tablet (150 mg total) by mouth 2 (two) times daily  Qty: 60 tablet, Refills: 0      levETIRAcetam (KEPPRA) 500 MG tablet Take 500 mg by mouth 2 (two) times daily      LORazepam (Ativan) 0.5 MG tablet Take 0.5 mg by mouth daily as needed for Anxiety  Lurasidone HCl (Latuda) 40 MG Tab Take 40 mg by mouth nightly      metoprolol succinate XL (TOPROL-XL) 25 MG 24 hr tablet Take 25 mg by mouth daily      pantoprazole (PROTONIX) 40 MG tablet Take 1 tablet (40 mg total) by mouth daily  Qty: 30 tablet, Refills: 0      psyllium (METAMUCIL) 58.12 % Pack packet Take 1 packet by mouth daily  Qty: 30 packet, Refills: 0      QUEtiapine (SEROQUEL) 50 MG tablet Take 1 tablet (50 mg total) by mouth nightly  Qty: 30 tablet, Refills: 0      ranolazine (RANEXA) 1000 MG SR tablet Take 1 tablet (1,000 mg total) by mouth 2 (two) times daily  Qty: 60 tablet, Refills: 0      rOPINIRole (REQUIP) 1 MG tablet Take 1 tablet (1 mg total) by mouth nightly  Qty: 30 tablet, Refills: 0                Suanne Marker, DO  05/21/19 1220

## 2019-05-20 NOTE — Plan of Care (Signed)
The learning abilities of the patient  have been assessed. Today's individualized plan of care includes neuro check Q4, dysphagia test, medication for headache and vomiting, MRI. The plan of care was discussed with the patient and/or caregiver, who agrees to it and demonstrates understanding of the disease process, risk factors, treatment plan, medications and consequences of noncompliance. All questions and concerns were addressed.   Problem: Safety  Goal: Patient will be free from injury during hospitalization  Outcome: Progressing  Flowsheets (Taken 05/20/2019 2311)  Patient will be free from injury during hospitalization:   Assess patient's risk for falls and implement fall prevention plan of care per policy   Provide and maintain safe environment     Problem: Pain  Goal: Pain at adequate level as identified by patient  Outcome: Progressing  Flowsheets (Taken 05/20/2019 2311)  Pain at adequate level as identified by patient:   Identify patient comfort function goal   Reassess pain within 30-60 minutes of any intervention.   Evaluate if patient comfort function goal is met   Evaluate patient's satisfaction with pain management progress     Problem: Day of Admission - Stroke  Goal: Core/Quality measure requirements - Admission  Outcome: Progressing  Flowsheets (Taken 05/20/2019 2311)  Core/Quality measure requirements - Admission:   Document NIH Stroke Scale on admission   Document nursing swallow/dysphagia screen on admission. If patient fails, keep patient NPO.   VTE Prevention: Ensure anticoagulant administered and anti-embolism devices documented as ordered   Ensure antithrombotic administered or contraindication documented by LIP   If diagnosis or history of Atrial Fib/Atrial Flutter, ensure oral anticoagulation is initiated or contraindication documented by LIP   Ensure lipid panel ordered   Begin stroke education on admission (must include Modifiable Risk Factors, Warning Signs and Symptoms of Stroke,  Activation of Emergency Medical System and Follow-up Appointments) Ensure handout has been given and documented.   Ensure PT/OT and/or SLP ordered     Problem: Every Day - Stroke  Goal: Neurological status is stable or improving  Outcome: Progressing  Flowsheets (Taken 05/20/2019 2311)  Neurological status is stable or improving:   Monitor (Stroke: every 4 hours)   Observe for seizure activity and initiate seizure precautions if indicated   Perform CAM Assessment     Problem: Hemodynamic Status: Cardiac  Goal: Stable vital signs and fluid balance  Outcome: Progressing  Flowsheets (Taken 05/20/2019 2311)  Stable vital signs and fluid balance:   Monitor vital signs and telemetry per unit protocol   Assess signs and symptoms associated with cardiac rhythm changes   Monitor lab values   Monitor for leg swelling/edema and report to LIP if abnormal     Problem: Inadequate Tissue Perfusion  Goal: Adequate tissue perfusion will be maintained  Outcome: Progressing  Flowsheets (Taken 05/20/2019 2311)  Adequate tissue perfusion will be maintained:   Monitor for signs of VTE (edema of calf/thigh redness, pain)   Monitor for signs and symptoms of a pulmonary embolism (dyspnea, tachypnea, tachycardia, confusion)   VTE Prevention: Administer anticoagulant(s) and/or apply anti-embolism stockings/devices as ordered    Administer pain medication for headache, nausea and vomiting.  Adjust lightning and reduce noise to help with headache.

## 2019-05-20 NOTE — Stroke Progress Note (Signed)
Delay with CTA d/t needing US guided IV access

## 2019-05-20 NOTE — Progress Notes (Signed)
Report taken from Darshi in ED [phone]. Pt currently in MRI will tx to [facility] room 621-1 after MRI.

## 2019-05-20 NOTE — Progress Notes (Signed)
Pt arrived to [facility] at 1830. V/S monitored, admission NIH 8, pt failed dysphagia screen d/t L facial droop and slurred speech. Dr Nyra Capes notified. SLP consulted for AM. PT/OT consulted too. Pt home meds reviewed with patient. Skin assessment completed with oncoming RN. Pt c/o HA, benadryl administered per MAR. Pt needing ativan for MRI. Ativan administered @1926 .

## 2019-05-20 NOTE — ED Notes (Addendum)
[facility]  Stroke Team Evaluation     Code called on:   Date: 05/20/2019  Time Code Called: 12    63 y.o. female presenting with right sided facial droop     Last known well: 05/20/19 1445  Initial NIH Stroke Scale: 7  Prior stroke: hx of TIAs  Patient on anticoagulation: Yes (Eloquis)  Pre-stroke mRS: 0  Left or right handed: Right   List cardiovascular risk factors (htn, hld, dm, a-fib, tobacco, etc.): A-fib    Discussed with Dr. Zenovia Jarred, RN  05/20/2019 3:23 PM    [facility] ED Stroke RN / [phone]  [facility] Inpatient Stroke RN / [phone]      *Modified Rankin Scale:  0 - No symptoms at all   1 - No significant disability despite symptoms; able to carry out all usual duties and activities  2 - Slight disability; unable to carry out all previous activities, but able to look after own affairs without assistance  3 - Moderate disability; requiring some help, but able to walk without assistance  4 - Moderately severe disability; unable to walk without assistance and unable to attend to own bodily needs without assistance  5 - Severe disability; bedridden, incontinent and requiring constant nursing care and attention  6 - Dead

## 2019-05-20 NOTE — Nursing Progress Note (Signed)
Went down to MRI.

## 2019-05-20 NOTE — EDIE (Signed)
COLLECTIVE?NOTIFICATION?05/20/2019 15:07?[name], [name]?MRN: [id number]    [facility] [facility]'s patient encounter information:   VWU:[id number]  Account [id number]  Billing Account [id number]      Criteria Met      3 Different Facilities in 90 Days    5 ED Visits in 12 Months    Security and Safety  No recent Security Events currently on file    ED Care Guidelines  There are currently no ED Care Guidelines for this patient. Please check your facility's medical records system.    Flags      Negative COVID-19 Lab Result - [facility] - A specimen collected from this patient was negative for COVID-19 / Attributed By: [place] Department of Health / Attributed On: 05/06/2019       Prescription Monitoring Program  471??- Narcotic Use Score  381??- Sedative Use Score  000??- Stimulant Use Score  280??- Overdose Risk Score  - All Scores range from 000-999 with 75% of the population scoring < 200 and on 1% scoring above 650  - The last digit of the narcotic, sedative, and stimulant score indicates the number of active prescriptions of that type  - Higher Use scores correlate with increased prescribers, pharmacies, mg equiv, and overlapping prescriptions  - Higher Overdose Risk Scores correlate with increased risk of unintentional overdose death   Concerning or unexpectedly high scores should prompt a review of the PMP record; this does not constitute checking PMP for prescribing purposes.      E.D. Visit Count (12 mo.)  Facility Visits   [facility] [address] - [facility] 2   [facility] [place] 5   [facility] 1   [place] [facility]: [facility] 1   [facility] 2   Total 54   Note: Visits indicate total known visits.      Recent Emergency Department Visit Summary  Showing 10 most recent visits out of 54 in the past 12 months  Date Facility [facility] Type Diagnoses or Chief Complaint   May 20, 2019 [place] [facility] [name] [place] [facility]      stroke symptomn      May 11, 2019  Emergency Room: [facility] [facility] [place] [facility]      CP      Chest Pain      Chest pain, unspecified      Other forms of angina pectoris      May 05, 2019 [facility] [name] [place] [facility]      DEMENTIA SYMPTOMS      ALTERED MENTAL STATUS      Weakness      Apr 28, 2019 [facility] [name] [place] [facility]      ASTHMA ATTACK      ASTHMA      Unspecified asthma with (acute) exacerbation      1. Cough      2. Shortness of breath      5. Chest pain, unspecified      6. Essential (primary) hypertension      7. Paroxysmal atrial fibrillation      8. Type 2 diabetes mellitus without complications      9. Personal history of transient ischemic attack (TIA), and cere      Apr 22, 2019 [facility] [name] [place] [facility]      RS/ DIFFICULTY BREATHING  RS DIFFICULTY BREATHING      SHORTNESS OF BREATH      1. Other chest pain      2. Shortness of breath      3. Wheezing      4. Mild intermittent asthma with (acute) exacerbation      5. Chronic obstructive pulmonary disease, unspecified      6. Essential (primary) hypertension      7. Type 2 diabetes mellitus without complications      Apr 21, 2019 [facility] [name] [place] [facility]      NECK PAIN      VASCULAR ACCESS PROBLEM      2. Encounter for adjustment and management of vascular access de      3. Essential (primary) hypertension      4. Paroxysmal atrial fibrillation      5. Type 2 diabetes mellitus without complications      6. Chronic obstructive pulmonary disease, unspecified      7. Personal history of transient ischemic attack (TIA), and cere      8. Long term (current) use of anticoagulants      9. Personal history of other diseases of the circulatory system      Apr 21, 2019 [facility] [name] [place] [facility]      CHEST PAIN      VASCULAR ACCESS PROBLEM      4. Encounter for adjustment  and management of vascular access de      5. Cervicalgia      6. Localized swelling, mass and lump, neck      7. Chest pain, unspecified      8. Procedure and treatment not carried out due to patient leavin      Apr 20, 2019 [facility] [name] [place] [facility]      PORT COMPLICATIONS      PAIN CONTROL      Other acute postprocedural pain      3. Chronic obstructive pulmonary disease, unspecified      4. Bipolar disorder, unspecified      5. Essential (primary) hypertension      6. Type 2 diabetes mellitus without complications      7. Paroxysmal atrial fibrillation      8. Epilepsy, unspecified, not intractable, without status epilep      9. Gastro-esophageal reflux disease without esophagitis      Apr 13, 2019 [facility] [name] [place] [facility]      HEADACHE, NO TRAVEL      HEADACHE RECURRENT OR KNOWN DX MIGRAINE      1. Headache      2. Nausea with vomiting, unspecified      3. Visual discomfort, bilateral      4. Migraine without aura, not intractable, without status migrai      5. Chronic obstructive pulmonary disease, unspecified      6. Essential (primary) hypertension      7. Type 2 diabetes mellitus without complications      8. Paroxysmal atrial fibrillation      Apr 10, 2019 [facility] [name] [place] [facility]      SHORTNESS OF BREATH/ARM PAIN      SHORTNESS OF BREATH      Other postprocedural complications of skin and subcutaneous t      1. Chest pain, unspecified      2. Chills (without fever)      3. Pain  in right arm      4. Other chest pain      5. Other specified postprocedural states      6. Anemia, unspecified      7. Essential (primary) hypertension          Recent Inpatient Visit Summary  Date Facility [facility] Type Diagnoses or Chief Complaint   Feb 14, 2019 [place] [facility] [place] [name]      LEFT SIDEED WEAKNESS      LEFT SIDEED WEAKNESS TIA TRANSIENT ISCHEMIC ATTACK      1. Weakness      2. Transient cerebral  ischemic attack, unspecified      3. Migraine with aura, not intractable, without status migrainos      4. Chronic obstructive pulmonary disease, unspecified      5. Paroxysmal atrial fibrillation      6. Type 2 diabetes mellitus without complications      7. Bipolar disorder, unspecified      8. Essential (primary) hypertension      Jan 05, 2019 [place] [facility] [place] [name]      Acute sialoadenitis      Chest pain, unspecified      Torticollis      Dec 18, 2018 [facility] [name]      RECTAL BLEEDING      Anemia, unspecified      Melena      1. Hemorrhage of anus and rectum      2. Diverticulosis of large intestine without perforation or absc      3. Acute posthemorrhagic anemia      4. Epilepsy, unspecified, not intractable, without status epilep      5. Paroxysmal atrial fibrillation      6. Bipolar disorder, unspecified      7. Hyperlipidemia, unspecified      Nov 21, 2018 [facility] [name]      ACUTE CHEST PAIN      1. Chest pain, unspecified      2. Other chest pain      3. Chronic diastolic (congestive) heart failure      4. Chronic obstructive pulmonary disease, unspecified      5. Hypertensive heart disease with heart failure      6. Bipolar disorder, unspecified      7. Type 2 diabetes mellitus without complications      8. 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 [name] [place] [facility]      WEAKNESS OF LEFT UPPER EXTREMITY      Other symptoms and signs involving the musculoskeletal system      Slurred speech      WEAKNESS OF LEFT UPPER EXTREMITY PROGRESSIVE FOCAL MOTO      Jul 08, 2018 [facility] [name]      SYNCOPE      Dizziness and giddiness      SYNCOPE SLURRED SPEECH      3. Paroxysmal atrial fibrillation      4. Slurred  speech      4. Syncope and collapse      5. Essential (primary) hypertension      6. Gastro-esophageal reflux disease without esophagitis  7. Type 2 diabetes mellitus without complications      8. Anxiety disorder, unspecified      Jun 20, 2018 [facility] [name] [place] [facility]      CHEST PAIN      Chest pain, unspecified      CHEST PAIN CHEST PAIN      Jun 02, 2018 [facility] [place] [name] [place] [facility]      CHEST PAIN      Chest pain, unspecified      CHEST PAIN CHEST PAIN      2. Other chest pain      3. Chronic obstructive pulmonary disease, unspecified      4. Paroxysmal atrial fibrillation      5. Essential (primary) hypertension      6. Bipolar disorder, unspecified      7. Anxiety disorder, unspecified      8. Hyperlipidemia, unspecified          Care Team  There is not a care team on record at this time.   Collective Portal  This patient has registered at the [facility] Department   For more information visit: [email or web address]     PLEASE NOTE:     1.   Any care recommendations and other clinical information are provided as guidelines or for historical purposes only, and providers should exercise their own clinical judgment when providing care.    2.   You may only use this information for purposes of treatment, payment or health care operations activities, and subject to the limitations of applicable Collect[employer] Policies.    3.   You should consult directly with the organization that provided a care guideline or other clinical history with any questions about additional information or accuracy or completeness of information provided.    ? 2020 [place], [employer] - [email or web address]

## 2019-05-21 LAB — BASIC METABOLIC PANEL
Anion Gap: 11 (ref 5.0–15.0)
BUN: 11 mg/dL (ref 7–19)
CO2: 23 mEq/L (ref 22–29)
Calcium: 8.7 mg/dL (ref 8.5–10.5)
Chloride: 110 mEq/L (ref 100–111)
Creatinine: 0.8 mg/dL (ref 0.6–1.0)
Glucose: 118 mg/dL — ABNORMAL HIGH (ref 70–100)
Potassium: 3.8 mEq/L (ref 3.5–5.1)
Sodium: 144 mEq/L (ref 136–145)

## 2019-05-21 LAB — CBC
Absolute NRBC: 0 10*3/uL (ref 0.00–0.00)
Hematocrit: 32.3 % — ABNORMAL LOW (ref 34.7–43.7)
Hgb: 10.4 g/dL — ABNORMAL LOW (ref 11.4–14.8)
MCH: 27.7 pg (ref 25.1–33.5)
MCHC: 32.2 g/dL (ref 31.5–35.8)
MCV: 86.1 fL (ref 78.0–96.0)
MPV: 9.6 fL (ref 8.9–12.5)
Nucleated RBC: 0 /100 WBC (ref 0.0–0.0)
Platelets: 294 10*3/uL (ref 142–346)
RBC: 3.75 10*6/uL — ABNORMAL LOW (ref 3.90–5.10)
RDW: 14 % (ref 11–15)
WBC: 7.02 10*3/uL (ref 3.10–9.50)

## 2019-05-21 LAB — ECG 12-LEAD
Atrial Rate: 78 {beats}/min
P Axis: 34 degrees
P-R Interval: 152 ms
Q-T Interval: 408 ms
QRS Duration: 84 ms
QTC Calculation (Bezet): 465 ms
R Axis: 40 degrees
T Axis: 42 degrees
Ventricular Rate: 78 {beats}/min

## 2019-05-21 LAB — LIPID PANEL
Cholesterol / HDL Ratio: 2.9
Cholesterol: 170 mg/dL (ref 0–199)
HDL: 58 mg/dL (ref 40–9999)
LDL Calculated: 102 mg/dL — ABNORMAL HIGH (ref 0–99)
Triglycerides: 52 mg/dL (ref 34–149)
VLDL Calculated: 10 mg/dL (ref 10–40)

## 2019-05-21 LAB — HEMOGLOBIN A1C
Average Estimated Glucose: 125.5 mg/dL
Hemoglobin A1C: 6 % — ABNORMAL HIGH (ref 4.6–5.9)

## 2019-05-21 LAB — HEMOLYSIS INDEX: Hemolysis Index: 13 (ref 0–18)

## 2019-05-21 LAB — GFR: EGFR: >60

## 2019-05-21 MED ORDER — ONDANSETRON HCL 4 MG/2ML IJ SOLN
4.00 mg | Freq: Three times a day (TID) | INTRAMUSCULAR | Status: DC | PRN
Start: 2019-05-21 — End: 2019-05-21
  Administered 2019-05-21: 4 mg via INTRAVENOUS
  Filled 2019-05-21: qty 2

## 2019-05-21 MED ORDER — BUTALBITAL-APAP-CAFFEINE 50-325-40 MG PO TABS
1.0000 | ORAL_TABLET | Freq: Four times a day (QID) | ORAL | 0 refills | Status: DC | PRN
Start: 2019-05-21 — End: 2019-05-21

## 2019-05-21 MED ORDER — DIPHENHYDRAMINE HCL 50 MG/ML IJ SOLN
12.50 mg | INTRAMUSCULAR | Status: DC | PRN
Start: 2019-05-21 — End: 2019-05-21
  Administered 2019-05-21 (×3): 12.5 mg via INTRAVENOUS
  Filled 2019-05-21 (×3): qty 1

## 2019-05-21 NOTE — Final Progress Note (DC Note for stay less than 48 (Addendum)
Date:05/21/2019   Patient Name: [name],[name]  Attending Physician: Richarda Osmond, MD  Today:   BP 95/56   Pulse 82   Temp 98.8 F (37.1 C) (Oral)   Resp 16   Ht 1.651 m (5\' 5" )   Wt 96.6 kg (212 lb 14.4 oz)   LMP  (LMP Unknown)   SpO2 100%   BMI 35.43 kg/m   Ranges for the last 24 hours:  Temp:  [97.9 F (36.6 C)-99.1 F (37.3 C)] 98.8 F (37.1 C)  Heart Rate:  [59-87] 82  Resp Rate:  [15-20] 16  BP: (95-141)/(56-75) 95/56    Date of Admission:   05/20/2019    Date of Discharge:   05/21/2019    Outcome of Hospitalization:   -headache and left sided weakness, resolved,no stroke, doubt TIA, the cause is unknown at this time,likely no physiological causes  -h/o prox atrial fibrillation on Eliquis  -chronic Chest pain/angina on Ranolazine   -h/o HLd  -h/o diet controlled DM II,A1C 6%  -h/o seizure disorder  -h/o GI bleed in the past,not now  -h/o asthma and COPD,stable    Significant Events:      63 years old female with past medical history of atrial fibrillation on Eliquis, history of COPD, history of hypertension,hyperlipidemia, TIA, ?conversion disorder, asthma and anemia presented to the hospital with acute onset of slurred speech, facial droop, left-sided weakness associated with acute onset headache. Her CT head was neg,not tPA candidate since she was on Eliquis, neurology was consulted,CT angios done which did not show any large vessel occlusion but showed diminished opacification of the M3 and M4 branches of the right MCA--?  Artifactual versus old,she had MRI brain which was negative,she was requesting morphine for headache, she was educated about narcotics,checking PMP noted she has hx of narcotics use.she is stable for discharge home.she is advised to take her home meds as before and follow up with her PCP and neurologist and psychiatrist as before.    She understood an verbalized understanding Bath plan, follow ups and meds.    Addendum at 3:28 pm 05/21/2019:    Reviewing epic noted pt with multiple  hospitalizations this year for various reasons, on this admission, she spoke with CM to appeal her discharge and was informed that she was in observation status and could not appeal her discharge. She also contacted patient relation who contacted me regarding this pt and I explained that patient was stable from medicine and neurology stand point and there were no further workup/images scheduled,she had had CT head,CTA head and neck and MRI brain all of which did not show any acute abnormalities and she was stable for discharge home to follow up with her PCP and her own neurologist as out pt.      Lab Results last 48 Hours     Procedure Component Value Units Date/Time    Hemoglobin A1C [[id number]]  (Abnormal) Collected:  05/21/19 0513    Specimen:  Blood Updated:  05/21/19 0947     Hemoglobin A1C 6.0 %      Average Estimated Glucose 125.5 mg/dL     Narrative:       This is NOT the correct Test for Patients with  Hemoglobinopathy.    Lipid panel [[id number]]  (Abnormal) Collected:  05/21/19 0513    Specimen:  Blood Updated:  05/21/19 0944     Cholesterol 170 mg/dL      Triglycerides 52 mg/dL      HDL 58 mg/dL  LDL Calculated 102 mg/dL      VLDL Calculated 10 mg/dL      Cholesterol / HDL Ratio 2.9    Narrative:       This is NOT the correct Test for Patients with  Hemoglobinopathy.    Hemolysis index [[id number]] Collected:  05/21/19 0513     Updated:  05/21/19 0944     Hemolysis Index 13    Narrative:       This is NOT the correct Test for Patients with  Hemoglobinopathy.    Basic Metabolic Panel [[id number]]  (Abnormal) Collected:  05/21/19 0513    Specimen:  Blood Updated:  05/21/19 0721     Glucose 118 mg/dL      BUN 11 mg/dL      Creatinine 0.8 mg/dL      Calcium 8.7 mg/dL      Sodium 440 mEq/L      Potassium 3.8 mEq/L      Chloride 110 mEq/L      CO2 23 mEq/L      Anion Gap 11.0    GFR [[id number]] Collected:  05/21/19 0513     Updated:  05/21/19 0721     EGFR >60.0    CBC without differential [[id number]]   (Abnormal) Collected:  05/21/19 0513    Specimen:  Blood Updated:  05/21/19 0640     WBC 7.02 x10 3/uL      Hgb 10.4 g/dL      Hematocrit 95.6 %      Platelets 294 x10 3/uL      RBC 3.75 x10 6/uL      MCV 86.1 fL      MCH 27.7 pg      MCHC 32.2 g/dL      RDW 14 %      MPV 9.6 fL      Nucleated RBC 0.0 /100 WBC      Absolute NRBC 0.00 x10 3/uL     Troponin I [[id number]] Collected:  05/20/19 1541    Specimen:  Blood Updated:  05/20/19 1728     Troponin I 0.02 ng/mL     Comprehensive metabolic panel [[id number]]  (Abnormal) Collected:  05/20/19 1541    Specimen:  Blood Updated:  05/20/19 1723     Glucose 91 mg/dL      BUN 6 mg/dL      Creatinine 0.9 mg/dL      Sodium 063 mEq/L      Potassium 3.5 mEq/L      Chloride 107 mEq/L      CO2 25 mEq/L      Calcium 9.1 mg/dL      Protein, Total 6.8 g/dL      Albumin 3.8 g/dL      AST (SGOT) 13 U/L      ALT 11 U/L      Alkaline Phosphatase 70 U/L      Bilirubin, Total 0.3 mg/dL      Globulin 3.0 g/dL      Albumin/Globulin Ratio 1.3     Anion Gap 11.0    GFR [[id number]] Collected:  05/20/19 1541     Updated:  05/20/19 1723     EGFR >60.0    APTT [[id number]]  (Abnormal) Collected:  05/20/19 1541     Updated:  05/20/19 1712     PTT 41 sec     Prothrombin time/INR [[id number]] Collected:  05/20/19 1541    Specimen:  Blood Updated:  05/20/19 1711     PT 13.8  sec      PT INR 1.1    CBC and differential [[id number]]  (Abnormal) Collected:  05/20/19 1541    Specimen:  Blood Updated:  05/20/19 1703     WBC 6.70 x10 3/uL      Hgb 11.1 g/dL      Hematocrit 82.9 %      Platelets 305 x10 3/uL      RBC 3.95 x10 6/uL      MCV 85.8 fL      MCH 28.1 pg      MCHC 32.7 g/dL      RDW 13 %      MPV 9.6 fL      Neutrophils 47.3 %      Lymphocytes Automated 37.3 %      Monocytes 8.7 %      Eosinophils Automated 5.7 %      Basophils Automated 0.7 %      Immature Granulocytes 0.3 %      Nucleated RBC 0.0 /100 WBC      Neutrophils Absolute 3.17 x10 3/uL      Lymphocytes Absolute Automated 2.50 x10 3/uL       Monocytes Absolute Automated 0.58 x10 3/uL      Eosinophils Absolute Automated 0.38 x10 3/uL      Basophils Absolute Automated 0.05 x10 3/uL      Immature Granulocytes Absolute 0.02 x10 3/uL      Absolute NRBC 0.00 x10 3/uL     Glucose Whole Blood - POCT [[id number]] Collected:  05/20/19 1511     Updated:  05/20/19 1512     Whole Blood Glucose POCT 96 mg/dL           Procedures performed:   Cta  Head And Neck    Result Date: 05/20/2019   Diminished opacification of the M3 and M4 branches of the right middle artery suggestive of occlusion of these vessels. Merri Ray, MD  05/20/2019 5:06 PM    Ct Head Without Contrast    Result Date: 05/20/2019    No acute intracranial abnormality.  Other findings as above. Gustavus Messing, MD  05/20/2019 3:37 PM    Mri Brain W Wo Contrast    Result Date: 05/20/2019   Mild microvascular ischemic changes. No acute infarct is demonstrated. Merri Ray, MD  05/20/2019 8:52 PM        Treatment Team:   Attending Provider: Richarda Osmond, MD    Disposition:   Disposition: Home or Self Care    Condition at Discharge:   Improved and stable       Discharge Instructions:     Follow-up Information     Malcolm Metro, MD .    Specialty:  Family Medicine  Contact information:  [name]  [place] [address]  [phone]                    Discharge Medication List      Taking    acetaminophen 325 MG tablet  Dose:  650 mg  Commonly known as:  TYLENOL  Take 2 tablets (650 mg total) by mouth every 4 (four) hours as needed for Pain     albuterol 108 (90 Base) MCG/ACT inhaler  Dose:  1 puff  Commonly known as:  PROVENTIL HFA  For:  Asthma, Chronic Obstructive Lung Disease  Inhale 1 puff into the lungs daily Also take it PRN every 20 minutes.     amLODIPine 2.5 MG tablet  Dose:  2.5 mg  Commonly known as:  NORVASC  Take 1 tablet (2.5 mg total) by mouth daily     apixaban 5 MG  Dose:  5 mg  Commonly known as:  ELIQUIS  Take 1 tablet (5 mg total) by mouth every 12 (twelve) hours     Ativan 0.5  MG tablet  Dose:  0.5 mg  For:  Feeling Anxious  Generic drug:  LORazepam  Take 0.5 mg by mouth daily as needed for Anxiety     atorvastatin 40 MG tablet  Dose:  40 mg  Commonly known as:  LIPITOR  Take 40 mg by mouth daily     budesonide-formoterol 80-4.5 MCG/ACT inhaler  Dose:  2 puff  Commonly known as:  SYMBICORT  For:  Asthma, Chronic Obstructive Lung Disease  Inhale 2 puffs into the lungs daily     furosemide 20 MG tablet  Dose:  20 mg  Commonly known as:  LASIX  Take 1 tablet (20 mg total) by mouth daily     lactobacillus/streptococcus Caps  Dose:  1 capsule  Take 1 capsule by mouth daily     lamoTRIgine 150 MG tablet  Dose:  150 mg  Commonly known as:  LaMICtal  Take 1 tablet (150 mg total) by mouth 2 (two) times daily     Latuda 40 MG Tabs  Dose:  40 mg  For:  Depressive Phase of Manic-Depression  Generic drug:  Lurasidone HCl  Take 40 mg by mouth nightly     levETIRAcetam 500 MG tablet  Dose:  500 mg  Commonly known as:  KEPPRA  Take 500 mg by mouth 2 (two) times daily     metoprolol succinate XL 25 MG 24 hr tablet  Dose:  25 mg  Commonly known as:  TOPROL-XL  Take 25 mg by mouth daily     pantoprazole 40 MG tablet  Dose:  40 mg  Commonly known as:  PROTONIX  Take 1 tablet (40 mg total) by mouth daily     psyllium 58.12 % Pack packet  Dose:  1 packet  Commonly known as:  METAMUCIL  Take 1 packet by mouth daily     QUEtiapine 50 MG tablet  Dose:  50 mg  Commonly known as:  SEROquel  Take 1 tablet (50 mg total) by mouth nightly     ranolazine 1000 MG SR tablet  Dose:  1,000 mg  Commonly known as:  RANEXA  Take 1 tablet (1,000 mg total) by mouth 2 (two) times daily     rOPINIRole 1 MG tablet  Dose:  1 mg  Commonly known as:  REQUIP  Take 1 tablet (1 mg total) by mouth nightly            Signed by: Richarda Osmond, MD

## 2019-05-21 NOTE — SLP Eval Note (Signed)
[facility]  [address]  [place], [place] [address]  [phone]    SPEECH LANGUAGE COGNITIVE SWALLOW EVALUATION    [name]    MRN#: [id number]    Date/Time of Evaluation:   Time Calculation  SLP Received On: 05/21/19  Start Time: 0725  Stop Time: 0745  Time Calculation (min): 20 min  Total Treatment Time (min): 20    Consult received for [name] for SLP Evaluation and Treatment.    Referring Physician: Dr. Deforest Hoyles    Date of Referral: 05/21/2019    Interpreter utilized: no, not indicated    Therapist PPE during session procedural mask    Assessment and Clinical Impression   [name] is a 63 y.o. female admitted 05/20/2019 for  Left-sided weakness [R53.1] presenting with speech, language, cognitive linguistic skills, and swallow WFL and at baseline. Consult received per stroke protocol; MRI negative for acute infarction. Pt admitted with L facial numbness and a facial droop that appears nonphysiological and has alternated between R and L side of mouth per chart review. During this session, pt's R side of mouth appears tensed and partially retracted, but she reports that side feels weak and numb. Smile is symmetric. ROM intact during conversation. No dysarthria appreciated, although pt endorses feeling that her speech "is different" but isn't able to provide further details and states no when asked if it feels slurred, quieter, dysfluent, or if she is having difficulty with word retrieval. Intact auditory comprehension, word retrieval, and thought organization at discourse level. Patient is oriented x4 and reports no changes in cognition associated with acute medical event. Oral motor exam reveals labial asymmetry as described above, lingual deviation R side without apparent weakness or flaccidity (in fact, appears tensed to R side). No s/s aspiration with 8 oz thin water or with crackers. Trace oral spillage of liquids from cup. Initiate regular diet and thin liquids. No SLP  intervention indicated.       Indication for instrumental assessment of swallow: n/a  Plan   Goals of Care: treatment/curative pathway  Plan / Recommendations  Plan: begin/continue oral diet, no SLP f/u necessary  SLP Frequency Recommended: one time visit  Diet Solids Recommendation: regular  Diet Liquids Recommendations: thin consistency  Precautions/Compensations: Awake/alert, Upright 90 degrees for all oral intake  Recommended Form of Meds: PO, whole  Suggestions for Feeding: independent  Recommendation Discussed With: : Patient, Nurse, Posted bedside      Goals: n/a  Rehabilitation Prognosis: n/a n/a      History     Prior Speech Therapy Intervention: Acute rehab in 2019 for cognitive linguistic skills    Medical Diagnosis: Left-sided weakness [R53.1]    History of Present Illness: [name] is a 63 y.o. female admitted on 05/20/2019 with   Patient Active Problem List   Diagnosis   . CVA (cerebral vascular accident)   . Chest pain   . Chest pain with high risk of acute coronary syndrome   . Paroxysmal atrial fibrillation   . Hypertension   . Hyperlipidemia   . Seizure disorder   . History of gastrointestinal hemorrhage   . Depression   . Anemia   . Asthma   . Non-traumatic subcutaneous emphysema   . Chest pain with moderate risk for cardiac etiology   . Left-sided weakness        Past Medical/Surgical History  Past Medical History:   Diagnosis Date   . Anemia    . Asthma    .  Atrial fibrillation    . Chronic obstructive pulmonary disease    . Convulsions    . Depression    . Diabetes mellitus     diet controlled    . Hyperlipidemia    . Hypertension    . TIA (transient ischemic attack)       Past Surgical History:   Procedure Laterality Date   . CARDIAC CATHETERIZATION  2017   . HYSTERECTOMY     . pci               Subjective   Patient is agreeable to participation in the therapy session. Patient's medical condition is  appropriate for speech therapy intervention at this time.  Patient's goal:  What caused  this?   Pain: pt denies pain      Social History: Per chart review, lives with her daughter and is grossly independent but utilizes external aids for memory. Significant psychiatric history.      Objective     Respiratory Status  Current Status  Respiratory Status: within normal limits      Oral Motor Skills  Labial ROM: Reduced left;Reduced right(appears nonphysiological)  Labial Symmetry: Abnormal symmetry right;Abnormal symmetry left     Lingual ROM: Reduced left(appears nonphysiological)     Lingual Sensation: Within Functional Limits  Velum: Within Functional Limits  Mandible: Within Functional Limits  Facial ROM: Within Functional Limits  Facial Symmetry: Within Functional Limits  Facial Sensation: Within Functional Limits  Vocal Quality: Within Functional Limits     Vocal Intensity: Within Functional Limits     Apraxia: None present     Breath Support: Adequate for speech  Dentition: Adequate  Hearing: Within Functional Limits       Deglutition Skills  Position: upright 90 degrees  Food(s) Tested: thin liquid;solid  Oral Stage: adequate  Pharyngeal Stage: adequate  Esophageal Stage: appears WFL    Language Comprehension  Auditory Comprehension: Within Functional Limits           Following Commands: Follows one step commands without difficulty    Language Expression  Single Words: WFL  Connected Speech: WFL             Cognitive Linguistic Skills  Arousal/Alertness: Appropriate responses to stimuli  Attention Span: Appears intact  Orientation Level: Oriented X4  Memory: Appears intact  Safety Awareness: independent                Formal Testing  n/a        Signature  Gaylan Gerold, M.Ed CCC-SLP  05/21/2019  Phone: [phone]  [phone] for Rehab Tech/scheduling questions)

## 2019-05-21 NOTE — UM Notes (Signed)
Clinical review, 05/21/2019  OBSERVATION, 17 hours      63 y.o. female presents to ED  with complaint of acute onset headache with slurred speech and facial droop and left-sided weakness. Patient reports symptoms started 40 minutes ago.  She had acute onset of severe headache.  She also reports associated right-sided facial droop and left arm and left leg weakness.  She has a history of TIAs. She also has history of atrial fibrillation.  She is currently on Eliquis.  Last dose was within the past 48 hours.  She denies any chest pain or shortness of breath.  No abdominal pain or nausea or vomiting.  She has had multiple TIAs in the past but never had TPA.  No other complaints at this time.    VS:  24 hours)     Value Min Max   Temp 97.9 F (36.6 C) 99.1 F (37.3 C)   Heart Rate 59Abnormal  87   Resp Rate 15 20   BP: Systolic 108 141   BP: Diastolic 56 75   SpO2 97 % 100 %     Glucose 127  Chloride 113  Calcium 8.4  H/H 20.0/31.0    CT Head: negative  CT Angio Head Neck: Diminished opacification of the M3 and M4 branches of the  right middle artery suggestive of occlusion of these vessels.  MRI Brain:  Mild microvascular ischemic changes. No acute infarct is  Demonstrated.    Place in Observation Services (Order [id number])   Admission   Date: 05/20/2019 Department: [facility]     PT/OT/ST evals  Lovenox 40mg  SQ QD  Keppra 500mg  IV Q12  Morphine 2mg  IV Q4 prn.

## 2019-05-21 NOTE — Progress Notes (Signed)
Pt c/o a headache that "feels different" and pt is crying. Pt stated Morphine "helps" with her headache. MD page and notify. Pt does not have any order for morphine, benadryl administer for headache.

## 2019-05-21 NOTE — Progress Notes (Addendum)
05/21/19    PCP NOTIFICATION  Patient/patient designee was asked if patient/patient designee wanted to have his/her PCP notified. Patient/patient designee stated yes. CM called PCP and notified the physician that the patient is now hospitalized.  Agreeable to observation admit/ pt education done       05/21/19 0941   Patient Type   Within 30 Days of Previous Admission? No   Healthcare Decisions   Interviewed: Patient   Orientation/Decision Making Abilities of Patient Alert and Oriented x3, able to make decisions   Advance Directive not in Chart Copy requested from family/decision maker   Healthcare Agent Appointed No   Prior to admission   Prior level of function Independent with ADLs   Type of Residence Private residence   Home Layout Multi-level   Living Arrangements Children   Dressing Independent   Grooming Independent   Feeding Independent   Bathing Independent   Toileting Independent   DME Currently at Home   (cane)   Discharge Planning   Support Systems Family members;Children   Patient expects to be discharged to:   [facility], family will transport)   Anticipated [place] plan discussed with: Same as interviewed   Does the patient have perscription coverage? Yes   Consults/Providers   PT Evaluation Needed 1   OT Evalulation Needed 1   SLP Evaluation Needed 2   Correct PCP listed in Epic? Yes   PCP   PCP on file was verified as the current PCP? Yes

## 2019-05-21 NOTE — Nursing Progress Note (Addendum)
Patient consistently complains of headache "throbbing, pressure, tightness" score 7/10, pain score goes down to 0-2/10 after morphine. Patient asked for her PRN medication every 4 hours exactly when its time for the medication to be given. She verbalized side effects with morphine like nausea and itching and asked for zofran or benadryl to be given alongside with it.   AOx4. NSR on tele.  Still with left sided numbness and weakness and right facial droop, SLP to review patent today.  Room air no SOB.   Continent of bowel and bladder.   Walks to the bathroom with minimal supervision.

## 2019-05-21 NOTE — OT Eval Note (Signed)
[facility]  [address]  [place], [place] [address]  [phone]    Occupational Therapy Evaluation    [name] MRN: [id number]   Unit: [facility] INTERMEDIATE CARE Bed: MI621/MI621-01    Time of treatment: Time Calculation  OT Received On: 05/21/19  Start Time: 1240  Stop Time: 1250  Time Calculation (min): 10 min    Consult received for [name] for OT evaluation and treatment.  Patient's medical condition is appropriate for Occupational Therapy  intervention at this time.    Interpreter utilized: no, not indicated    D/C Suggestions   Based on today's performance, recommended discharge is to Home with supv.  Equipment recommended for discharge: No equipment needs  Transport Recommendations: no limitations    Discharge recommendations are based on patient's progression/regression. Please see most recent note for updated discharge recommendations.    Assessment     [name] is a 63 y.o. female admitted 05/20/2019.  Pt was Indep in bed mob, amb with CG A/Rw, Indep to don socks. Pt c/o L side weakness and headache.     Expanded chart review completed including review of labs, review of imaging, review of vitals and review of past hospitalizations.  Pt's ability to complete ADLs and functional transfers is impaired due to the following deficits: pain, decreased strength and transfers .  Pt demonstrates performance deficits with grooming, bathing, dressing, toileting and functional mobility. There are a few comorbidities or other factors that affect plan of care and require modification of task including: assistive device needed for mobility, residual neurological symptoms, has stairs to manage and home alone for a portion of the day.  Pt would continue to benefit from OT to address these deficits and increase functional independence.        Complexity Chart Review Performance Deficits Clinical Decision Making Hx/Comorbidities Assistance needed   Moderate Expanded 3-5 Several Options 1-2  Min/Mod assist (not at baseline)       PMP - Progressive Mobility Protocol   PMP Activity: Step 7 - Walks out of Room  Distance Walked (ft) (Step 6,7): 40 Feet     Rehabilitation Potential: Good    Interdisciplinary Communication: discussed with PT/RN    Plan     OT Plan  Risks/Benefits/POC Discussed with Pt/Family: With patient  Treatment Interventions: ADL retraining;Functional transfer training;UE strengthening/ROM  Discharge Recommendation: Home with supervision  OT Frequency Recommended: 1-2x/wk(1 visit)         Medical Diagnosis: Left-sided weakness [R53.1]    History of Present Illness: [name] is a 63 y.o. female admitted on  05/20/2019 with "past medical history of atrial fibrillation on Eliquis, history of COPD, history of hypertension hyperlipidemia, TIA, conversion, asthma and anemia presented to the hospital with acute onset of slurred speech facial droop left-sided weakness associated with acute onset headache. Patient has history of paroxysmal atrial fibrillation and she takes Eliquis 5 mg twice daily. Patient had history of TIA in the past also history of GI bleeding. Patient has history of diabetic mellitus only diet controlled she does not take any medicine. She also has a history of chronic chest pain and angina cardiac catheterization in the past not show any evidence of coronary artery disease. She takes Lipitor metoprolol ,ranolazine"        Patient Active Problem List   Diagnosis   . CVA (cerebral vascular accident)   . Chest pain   . Chest pain with high risk of acute coronary syndrome   . Paroxysmal  atrial fibrillation   . Hypertension   . Hyperlipidemia   . Seizure disorder   . History of gastrointestinal hemorrhage   . Depression   . Anemia   . Asthma   . Non-traumatic subcutaneous emphysema   . Chest pain with moderate risk for cardiac etiology   . Left-sided weakness     Past Medical History:   Diagnosis Date   . Anemia    . Asthma    . Atrial fibrillation    . Chronic  obstructive pulmonary disease    . Convulsions    . Depression    . Diabetes mellitus     diet controlled    . Hyperlipidemia    . Hypertension    . TIA (transient ischemic attack)      Past Surgical History:   Procedure Laterality Date   . CARDIAC CATHETERIZATION  2017   . HYSTERECTOMY     . pci           X-Rays/Tests/Labs:  Lab Results   Component Value Date/Time    HGB 10.4 (L) 05/21/2019 05:13 AM    HCT 32.3 (L) 05/21/2019 05:13 AM    K 3.8 05/21/2019 05:13 AM    NA 144 05/21/2019 05:13 AM    INR 1.1 05/20/2019 03:41 PM    TROPI 0.02 05/20/2019 03:41 PM    TROPI <0.01 05/12/2019 08:36 PM    TROPI <0.01 05/12/2019 06:33 AM    TROPI <0.01 05/12/2019 12:29 AM     MRI Brain W WO Contrast (Order: [id number]) - 05/20/2019    IMPRESSION:    Mild microvascular ischemic changes. No acute infarct is  demonstrated.    Social History:  Lives with daughter in a house.  Entry Steps: 0  Rails: 0 Inside steps: 12 Rails: yes  Equipment at home: tub/shower, shower chair, [facility],   Prior Level of Function:   Cognition: WFL   Mobility/Locomotion: Indep  Feeding: Indep  Grooming: Indep  Bathing: Indep  Dressing: Indep  Toileting: Indep      Subjective     Patient is agreeable to participation in the therapy session. Nursing clears patient for therapy.  Patient's Goal:  None stated  Pain: Pt unable to quantify  Location: Headache  Therapist Intervention: positioned for comfort and premedicated for pain by RN  Patient is satisfied with therapist intervention.    Objective     Precautions:   Precautions  Weight Bearing Status: no restrictions  Other Precautions: falls, (falls)    Patient is in bed with  Intravenous Access and Bed Alarm  in place.       Observation of patient/vitals:   Vitals:    05/21/19 0405 05/21/19 0506 05/21/19 0736 05/21/19 1142   BP: 108/68  108/65 95/56   Pulse: 63  67 82   Resp: 17  15 16    Temp: 97.9 F (36.6 C)  98.1 F (36.7 C) 98.8 F (37.1 C)   TempSrc: Oral  Oral Oral   SpO2: 97%  99% 100%   Weight:   96.6 kg (212 lb 14.4 oz)     Height:           Orientation/Cognition:     Alert and Oriented x 3  Cognition: WFL      Musculoskeletal Examination:     ROM Strength   RUE WFL 5/5   LUE WFL 4/5         Sensation: Intact to light touch,reported decreased sensation to L UE /light touch  Coordination: Intact gross motor and serial opposition to B hands    Vision: WFL  Hearing: WFL      Functional Mobility:  Rolling: Indep    Supine to sit: Indep  Scooting: Indep  Sit to Supine: Indep  Sit to stand: Supv  Stand to sit: Supv  Transfers: CG A/RW  Ambulation: CG A/RW    Balance:  Static Sit Balance: good  Dynamic Sit Balance: good  Static Stand Balance: good  Dynamic Stand Balance: good    Self Care:  Feeding: Independent  LB Dressing: Indep don socks  Toileting: declined      Endurance: Good    Participation:  Fair    Education:  Educated the patient to role of occupational therapy, plan of care, goals  of therapy, rationale for progressing mobility.  RN notified of session outcome and that patient was left in bed with all needs met and equipment intact.   Safety measures include: handoff to nurse/clin tech/ unit secretary completed, bed alarm activated, oriented to call bell and placed within reach, personal items within reach, assistive device positioned out of reach and bed placed in lowest position.   Mobility and ADL status posted at bedside and within E.M.R.              Goals:  Goals  Time For Goal Achievement: 1 visit  Patient will groom self: Independent;at sinkside  Pt will transfer bed to toilet: Independent;with rolling walker  Pt will perform Home Exercise Program: independent        Therapist PPE during session procedural mask and gloves      Signature:   Kathlene November, OT  05/21/2019  4:42 PM    (For scheduling questions, please contact rehab tech [phone])

## 2019-05-21 NOTE — Discharge Instr - AVS First Page (Signed)
Reason for your Hospital Admission:  Headache, left sided weakness, no stroke      Instructions for after your discharge:  Take home medications as before  Follow up with primary doctor in 1 week

## 2019-05-21 NOTE — Plan of Care (Signed)
Problem: Every Day - Stroke  Goal: Neurological status is stable or improving  Flowsheets (Taken 05/21/2019 1014)  Neurological status is stable or improving:   Monitor/assess/document neurological assessment (Stroke: every 4 hours)   Perform CAM Assessment  Note: Pt is alert and oriented x4, SR on the monitor. Pt is able to follow commands and walks independently in the room.   Goal: Mobility/Activity is maintained at optimal level for patient  Flowsheets (Taken 05/21/2019 1014)  Mobility/activity is maintained at optimal level for patient:   Increase mobility as tolerated/progressive mobility   Encourage independent activity per ability   Consult/collaborate with Physical Therapy and/or Occupational Therapy  Note: Pt c/o of left lower leg decrease in sensation. SLP evaluated pt. Pt appears to have a left side mouth asymmetry that resolves when pt is speaking. Pt is able to tolerate oral intake, pt had breakfast and took all her medications. Pt c/o headache 6/10 that is tolerable at the moment. Will continue to monitor pt.      Problem: Hemodynamic Status: Cardiac  Goal: Stable vital signs and fluid balance  Flowsheets (Taken 05/21/2019 1014)  Stable vital signs and fluid balance:   Monitor/assess vital signs and telemetry per unit protocol   Assess signs and symptoms associated with cardiac rhythm changes   Monitor lab values     Problem: Inadequate Tissue Perfusion  Goal: Adequate tissue perfusion will be maintained  Flowsheets (Taken 05/21/2019 1014)  Adequate tissue perfusion will be maintained:   Monitor/assess vital signs   Monitor/assess lab values and report abnormal values   VTE Prevention: Administer anticoagulant(s) and/or apply anti-embolism stockings/devices as ordered   Position patient for maximum circulation/cardiac output     Problem: Inadequate Tissue Perfusion  Goal: Adequate tissue perfusion will be maintained  Flowsheets (Taken 05/21/2019 1014)  Adequate tissue perfusion will be maintained:    Monitor/assess vital signs   Monitor/assess lab values and report abnormal values   VTE Prevention: Administer anticoagulant(s) and/or apply anti-embolism stockings/devices as ordered   Position patient for maximum circulation/cardiac output     Problem: Inadequate Tissue Perfusion  Goal: Adequate tissue perfusion will be maintained  Flowsheets (Taken 05/21/2019 1014)  Adequate tissue perfusion will be maintained:   Monitor/assess vital signs   Monitor/assess lab values and report abnormal values   VTE Prevention: Administer anticoagulant(s) and/or apply anti-embolism stockings/devices as ordered   Position patient for maximum circulation/cardiac output     Problem: Inadequate Tissue Perfusion  Goal: Adequate tissue perfusion will be maintained  Flowsheets (Taken 05/21/2019 1014)  Adequate tissue perfusion will be maintained:   Monitor/assess vital signs   Monitor/assess lab values and report abnormal values   VTE Prevention: Administer anticoagulant(s) and/or apply anti-embolism stockings/devices as ordered   Position patient for maximum circulation/cardiac output   The learning abilities of the patient and/or caregiver have been assessed. Today's individualized plan of care includes neurological assessment and vital sign checks every 4 hours. The patient or caregiver states the following personal goal related to the patient's deficit(s): "By discharge I want to be able to not have weakness." The plan of care was discussed with the patient and/or caregiver, who agrees to it and demonstrates understanding of the disease process, risk factors, treatment plan, medications and consequences of noncompliance. All questions and concerns were addressed.

## 2019-05-21 NOTE — Nursing Progress Note (Signed)
0145: Patient called complaining again of headache 7/10 saying pain medication(morphine) given at 2127hrs gave her so much relief. Gave another PRN IV morphine.  0155: Called back again complaining that she's severely itching saying "I usually take morphine with benadryl. Benadryl is Q6 and couldn't give it.  Contacted hospitalist.  0210: she called back again "I feel that my tongue is thick". Assessed tongue, no edema or redness.  0230: Benadryl given   0245: "I feel much better"

## 2019-05-21 NOTE — Progress Notes (Signed)
Date Time: 05/21/19 12:20 PM  Patient Name: DanielleRose  Attending Physician: Richarda Osmond, MD  Patient Class: Observation  Hospital Day: 0            NEUROLOGY PROGRESS NOTE             Assessment/Plan   Left-sided weakness face arm and leg, unclear cause, exam nonphysiological, brain MRI is negative  History of atrial fibrillation on Eliquis  History of seizures    Brain MRI without any acute stroke, DWI images reviewed personally CT angios done yesterday which did not show any large vessel occlusion but showed diminished opacification of the M3 and M4 branches of the right MCA--?  Artifactual versus old especially in light of negative brain MR      No further neuro work-up required patient's MRI does not show CVA, patient already on Eliquis  Outpatient follow-up with your treating neurologist for other issues  Okay for discharge from my perspective      Subjective:   Patient Seen and Examined.  Still complaining of left-sided weakness    Review of Systems:   Mod  Headache,no  eye, ear nose, throat problems; no coughing or wheezing or shortness of breath, No chest pain or orthopnea, no abdominal pain, nausea or vomiting, No pain in the body or extremities, no psychiatric, neurological, endocrine, hematological or cardiac complaints except as noted above.      Physical Exam:   BP 95/56   Pulse 82   Temp 98.8 F (37.1 C) (Oral)   Resp 16   Ht 1.651 m (5\' 5" )   Wt 96.6 kg (212 lb 14.4 oz)   LMP  (LMP Unknown)   SpO2 100%   BMI 35.43 kg/m   Lying in bed awake and alert, oriented x3 speech is fluent smile slightly asymmetric,Tongue deviates to the right, parts of her examination are nonphysiological   No drift moving both arms and legs well reduced light touch temperature in the left face, arm and the leg      Meds:      Scheduled Meds: PRN Meds:    amLODIPine, 2.5 mg, Oral, Daily  atorvastatin, 40 mg, Oral, QHS  budesonide-formoterol, 2 puff, Inhalation, BID  butalbital-acetaminophen-caffeine, 2 tablet,  Oral, Once  enoxaparin, 1 mg/kg, Subcutaneous, Q12H SCH  famotidine, 20 mg, Intravenous, Q12H SCH  furosemide, 20 mg, Oral, Daily  lactobacillus/streptococcus, 1 capsule, Oral, Daily  lamoTRIgine, 150 mg, Oral, BID  levETIRAcetam, 500 mg, Intravenous, Q12H SCH  metoprolol succinate XL, 25 mg, Oral, Daily  NON-FORMULARY order form, 40 mg + 3mg , Oral, QHS  QUEtiapine, 50 mg, Oral, QHS  ranolazine, 1,000 mg, Oral, BID  rOPINIRole, 1 mg, Oral, QHS        Continuous Infusions:   acetaminophen, 650 mg, Q6H PRN  acetaminophen, 650 mg, Q4H PRN  albuterol-ipratropium, 3 mL, Q6H PRN  butalbital-acetaminophen-caffeine, 2 tablet, Q8H PRN  diphenhydrAMINE, 12.5 mg, Q4H PRN  naloxone, 0.2 mg, PRN  ondansetron, 4 mg, Q8H PRN                Labs:     Recent Labs   Lab 05/21/19  0513 05/20/19  1541   Glucose 118* 91   BUN 11 6*   Creatinine 0.8 0.9   Calcium 8.7 9.1   Sodium 144 143   Potassium 3.8 3.5   Chloride 110 107   CO2 23 25   Albumin  --  3.8   AST (SGOT)  --  13   ALT  --  11   Bilirubin, Total  --  0.3   Alkaline Phosphatase  --  70     Recent Labs   Lab 05/21/19  0513 05/20/19  1541   WBC 7.02 6.70   Hgb 10.4* 11.1*   Hematocrit 32.3* 33.9*   MCV 86.1 85.8   MCH 27.7 28.1   MCHC 32.2 32.7   Platelets 294 305         Recent Labs     05/20/19  1541   PTT 41*   PT 13.8   PT INR 1.1          Radiology Results (24 Hour)     Procedure Component Value Units Date/Time    MRI Brain W WO Contrast [[id number]] Collected:  05/20/19 2042    Order Status:  Completed Updated:  05/20/19 2054    Narrative:       INDICATION: Cerebrovascular accident.    TECHNIQUE: Axial T1-weighted, FLAIR, T2*, and  T2-weighted images of the  brain were obtained along with sagittal T1-weighted images. An axial  diffusion-weighted sequence was obtained along with an ADC map.  Following  the administration of 10 cc of gadolinium, T1-weighted axial  and coronal images were obtained.    FINDINGS: No intracranial mass lesion, hemorrhage, or  territorial  infarction is demonstrated. The ventricular system and subarachnoid  spaces are within normal limits. There are foci of increased signal  intensity on long TR images in the periventricular and deep white matter  suggestive of mild microvascular ischemic changes. Diffusion-weighted  images show no regions of restricted diffusion.  Following contrast  administration, no abnormal enhancement is noted. There is mild mucosal  thickening in the ethmoid sinuses..      Impression:        Mild microvascular ischemic changes. No acute infarct is  demonstrated.    Merri Ray, MD   05/20/2019 8:52 PM    CTA  Head and Neck [[id number]] Collected:  05/20/19 1653    Order Status:  Completed Updated:  05/20/19 1708    Narrative:       INDICATION: Slurred speech. Left-sided weakness.    TECHNIQUE: A spiral acquisition of the neck and head was obtained  following the administration of intravenous contrast. The data set was  reconstructed into a CT angiogram on an independent workstation by Dr.  Kenton Kingfisher. 3-D volume-rendered images were obtained. The patient was  injected with 100 cc of contrast.  A combination of automatic exposure  control, adjustment of the mA and or KV according to patient size,  and/or use of interative reconstruction technique was utilized.  Estimates of carotid stenoses were calculated using NASCET criteria.    FINDINGS: No stenosis is demonstrated involving the right common carotid  artery, right internal carotid artery and right external carotid artery.    There is no evidence of stenosis involving the left common carotid  artery, left internal carotid artery and left external carotid artery.    The vertebral arteries are similar in size bilaterally. No vertebral  artery stenosis is noted.    The anterior cerebral arteries and proximal middle cerebral arteries are  within normal limits. There is diminished opacification of the M3 and M4  branches of the right middle cerebral artery suggestive of  occlusion.  The basilar artery and posterior cerebral arteries are unremarkable.  No  aneurysm or arteriovenous malformation is identified.      Impression:        Diminished opacification of the M3 and M4  branches of the  right middle artery suggestive of occlusion of these vessels.    Merri Ray, MD   05/20/2019 5:06 PM    CT Head Without Contrast [[id number]] Collected:  05/20/19 1525    Order Status:  Completed Updated:  05/20/19 1539    Narrative:       INDICATION:  Slurred speech, left-sided weakness,    TECHNIQUE: Axial CT scans of the head was performed from the skull base  to the vertex without IV contrast.  A combination of automatic exposure  control and adjustment  of the mA and/or kV according to patient size  was utilized.    COMPARISON: 01/06/2019.    FINDINGS: There is no midline shift or acute hemorrhage.  There is  prominence of the ventricles and sulci, likely reflecting age related  cortical involutional changes.  Scattered foci of hypoattenuation within  the periventricular and subcortical white matter bilaterally is  nonspecific, but most commonly represents chronic small vessel ischemic  changes. There is no evidence of acute large territorial infarction.  There is no extra-axial collection.    Visualized paranasal sinuses, mastoid and ethmoid air cells are clear.   No focal bony abnormality is seen.      Impression:         No acute intracranial abnormality.  Other findings as  above.    Gustavus Messing, MD   05/20/2019 3:37 PM           All brain imaging (MRI, CT) personally reviewed.    Case discussed with: pt and Dr Deforest Hoyles       This note was generated by the Epic EMR system/ Dragon speech recognition and may contain inherent errors or omissions not intended by the user. Grammatical errors, random word insertions, deletions and pronoun errors  are occasional consequences of this technology due to software limitations.     Not all errors are caught or corrected. If there are questions or concerns  about the content of this note or information contained within the body of this dictation they should be addressed directly with the author for clarification.      Signed by: Cathe Mons, MD  Spectralink: [id number]      Answering Service: [phone]

## 2019-05-21 NOTE — Progress Notes (Signed)
CM MET W/ MS. Glace RE: HER REQUEST TO APPEAL HER D/C.    INFORMED THAT SHE WAS PLACED IN A OBSERVATION STATUS.  UNDER MEDICARE GUIDELINES  SHE CAN ONLY APPEAL IF ADMITTED   IN- PATIENT STATUS.    ENCOURAGE MS. Hackmann TO FU W/ HER NEUROLOGIST. WHICH SHE STATES SHE WILL.

## 2019-05-21 NOTE — PT Eval Note (Signed)
[facility]  [address]  [place], [place] [address]  [phone]    Physical Therapy Evaluation    [name] MRN: [id number]   Unit: [facility] INTERMEDIATE CARE Bed: MI621/MI621-01    Time of Treatment: Time Calculation  PT Received On: 05/21/19  Start Time: 1015  Stop Time: 1055  Time Calculation (min): 40 min    Consult received for [name] for PT evaluation and treatment.  Patient's medical condition is appropriate for Physical Therapy  intervention at this time.    Interpreter utilized: no, not indicated      D/C Suggestions   Based on today's performance:  Recommendation  Discharge Recommendation: Home with supervision;Home with home health PT  DME Recommended for Discharge: No additional equipment/DME recommended at this time  PT Frequency: 2-3x/wk.      Transport Recommendations: no limitations    Discharge recommendations are based on patient's progression/regression. Please see most recent note for updated discharge recommendations.    Assessment   [name] is a 63 y.o. female admitted 05/20/2019.  Pt admitted 2/2 slurred speech.  Pt lives with a daughter in a house with steps and is ind with all functional mobility and ADL's; no AD.    Today no slurred speech noted and LL lip droops a bit but not the upper lip.  Pt is ind with bed mobility, CGA to stand , min a to amb 10'.  She declined using an AD and is reaching for walls and furniture.  Of note L knee reflex is absent and BLE with impaired sharp/dull sensation.  LLE =4/5, RLE=5/5.  Pt has a cane at home she can use; she may benefit from HHPT.  She c/o posterior HA at 8/10; RN notified.      Pt's functional mobility is impacted by:  decreased activity tolerance, gait impairment, decreased insight, decreased safety awareness, pain, sensation deficits and no reflexes noted L knee.  There are a few comorbidities or other factors that affect plan of care and require modification of task including: assistive device needed for  mobility, residual neurological symptoms, has stairs to manage and home alone for a portion of the day.  Standardized tests and exams incorporated into evaluation include AMPAC mobility, balance, cognition/orientation, coordination, ROM  and Strength.     Pt would continue to benefit from PT to address these deficits and increase functional independence.     Complexity Level Hx and Co  morbidites Examination Clinical Decision Making Clinical Presentation     Moderate   1-2 factors 3 or more   Several options Evolving, plan may alter     PMP - Progressive Mobility Protocol   PMP Activity: Step 6 - Walks in Room  Distance Walked (ft) (Step 6,7): 20 Feet       Rehabilitation Potential:   Prognosis: Good;With continued PT status post acute discharge    Interdisciplinary Communication: RN      Plan       Plan  Risks/Benefits/POC Discussed with Pt/Family: With patient  Treatment/Interventions: Gait training;Stair training;Neuromuscular re-education;Functional transfer training;LE strengthening/ROM;Compensatory technique education  PT Frequency: 2-3x/wk         Medical Diagnosis: Left-sided weakness [R53.1]    History of Present Illness: [name] is a 63 y.o. female admitted on  05/20/2019 with   History of Presenting Illness:   [name] is a 63 y.o. female who with past medical history of atrial fibrillation on Eliquis, history of COPD, history of hypertension hyperlipidemia, TIA, conversion, asthma  and anemia presented to the hospital with acute onset of slurred speech facial droop left-sided weakness associated with acute onset headache.  Patient has history of paroxysmal atrial fibrillation and she takes Eliquis 5 mg twice daily.  Patient had history of TIA in the past also history of GI bleeding.  Patient has history of diabetic mellitus only diet controlled she does not take any medicine.  She also has a history of chronic chest pain and angina cardiac catheterization in the past not show any evidence of  coronary artery disease.  She takes Lipitor metoprolol , ranolazine          Patient Active Problem List   Diagnosis   . CVA (cerebral vascular accident)   . Chest pain   . Chest pain with high risk of acute coronary syndrome   . Paroxysmal atrial fibrillation   . Hypertension   . Hyperlipidemia   . Seizure disorder   . History of gastrointestinal hemorrhage   . Depression   . Anemia   . Asthma   . Non-traumatic subcutaneous emphysema   . Chest pain with moderate risk for cardiac etiology   . Left-sided weakness       Past Medical History:   Diagnosis Date   . Anemia    . Asthma    . Atrial fibrillation    . Chronic obstructive pulmonary disease    . Convulsions    . Depression    . Diabetes mellitus     diet controlled    . Hyperlipidemia    . Hypertension    . TIA (transient ischemic attack)        Past Surgical History:   Procedure Laterality Date   . CARDIAC CATHETERIZATION  2017   . HYSTERECTOMY     . pci         X-Rays/Tests/Labs:  Lab Results   Component Value Date/Time    HGB 10.4 (L) 05/21/2019 05:13 AM    HCT 32.3 (L) 05/21/2019 05:13 AM    K 3.8 05/21/2019 05:13 AM    NA 144 05/21/2019 05:13 AM    INR 1.1 05/20/2019 03:41 PM    TROPI 0.02 05/20/2019 03:41 PM    TROPI <0.01 05/12/2019 08:36 PM    TROPI <0.01 05/12/2019 06:33 AM    TROPI <0.01 05/12/2019 12:29 AM     MRI Brain W WO Contrast [[id number]] (Order [id number])      IMPRESSION:    Mild microvascular ischemic changes. No acute infarct is  Demonstrated.    Social History:  Lives with daughter in a house.  Entry Steps: 0  Rails: 0 Inside steps: 12 Rails: yes  Equipment at home: tub/shower, shower chair, [facility],   Prior Level of Function:   Cognition: WFL   Mobility/Locomotion: Indep  Feeding: Indep  Grooming: Indep  Bathing: Indep  Dressing: Indep  Toileting: Indep    Subjective   Patient is agreeable to participation in the therapy session. Nursing clears patient for therapy.  Patient's Goal:  None stated  Pain: Pt unable to quantify  Location: HA,  posterior  Therapist Intervention: positioned for comfort and RN notified of pt's request for pain medication  Patient is satisfied with therapist intervention.    Objective     Precautions/ Contraindications:   Precautions  Weight Bearing Status: no restrictions  Other Precautions: falls, HA, altered sensation BLE    Patient is in bed with  Telemetry, Intravenous Access, Bed Alarm and seizure pads in place.  Observation of patient/vitals:        Vitals:    05/21/19 0405 05/21/19 0506 05/21/19 0736 05/21/19 1142   BP: 108/68  108/65 95/56   Pulse: 63  67 82   Resp: 17  15 16    Temp: 97.9 F (36.6 C)  98.1 F (36.7 C) 98.8 F (37.1 C)   TempSrc: Oral  Oral Oral   SpO2: 97%  99% 100%   Weight:  96.6 kg (212 lb 14.4 oz)     Height:           Orientation/Cognition:  Alert and Oriented x 4  Cognition: wfl, impaired insight,      Musculoskeletal Examination:        ROM Strength   Neck/ Trunk wfl 5/5   RLE wfl 5/5   LLE wfl 4/5     Sensation: impaired to sharp/dull BLE, no L knee reflexe absent  Coordination: good BLE    Functional Mobility:  Rolling: ind    Supine to sit: ind  Scooting: ind to EOB  Sit to Supine: ind  Sit to stand: ind  Stand to sit: ind  Transfers: CGA    Ambulation:     Weightbearing: fwb   Assistance level: min a   Distance: 10'    Assistive Device: none, reaches for walls and furniture   Gait Deviations: needs further assessment, limited clearance BLE, furniture surfing   Stairs: NT    Balance:  Static Sit: good  Static Stand: good  Dynamic Stand: fair    Endurance: fair+    Participation:  good    Education:  Educated the patient to role of physical therapy, plan of care, goals  of therapy, rationale for progressing mobility and safety with mobility and ADLs.    RN notified of session outcome and that patient was left in bed with all needs met and equipment intact.   Safety measures include: handoff to nurse/clin tech/ unit secretary completed, bed alarm activated, oriented to call bell and  placed within reach, personal items within reach, assistive device positioned out of reach and bed placed in lowest position.   Mobility and ADL status posted at bedside and within E.M.R.    AM-PACT Inpatient Short Forms  Inpatient AM-PACT Performed? (PT): Basic Mobility Inpatient Short Form   AM-PACT "6 Clicks" Basic Mobility Inpatient Short Form  Turning Over in Bed: None  Sitting Down On/Standing From Armchair: None  Lying on Back to Sitting on Side of Bed: None  Assist Moving to/from Bed to Chair: A little  Assist to Walk in Hospital Room: A little  Assist to Climb 3-5 Steps with Railing: A little  PT Basic Mobility Raw Score: 21  CMS 0-100% Score: 28.97%      Goals  Goal Formulation: With patient  Time for Goal Acheivement: 3 visits  Goals: Select goal  Pt Will Ambulate: > 200 feet;with single point cane;with supervision  Pt Will Go Up / Down Stairs: 1 flight;with contact guard assist;With rail;With [facility]         Therapist PPE during session procedural mask, face shield  and gloves  Wearing prescription eye glasses and surgery cap      Signature:  Novella Olive, PT  05/21/2019  2:04 PM  Phone: [phone]     (For scheduling questions, please contact rehab tech [phone])    Attention MD:   Thank you for allowing [place] to participate in the care of [name]. Regulations from the Center for Medicare and  Medicaid Services (CMS) require your review and approval of this plan of care.     Please cosign this note indicating you are in agreement with theTherapy Plan of Care so we may initiate the therapy treatment plan.

## 2019-05-21 NOTE — Progress Notes (Signed)
11:40 am pt wants to talk with case manager because she wants to appeal her discharge. CM contacted and pt was in understanding about her admission status. Pt have no questions at the moment.       3:10 pm Care plan and education completed. Review AVS with pt at the bedside. Pt verbalize understanding of discharge teaching. . Pt is made aware of her discharge follow up appointment. No new medication is prescribe. Pt is wheel down to the blue lobby for pickup, pt is waiting on her son-in-law for pickup. Nurse Kelvin Cellar observing pt until pickup. All belongings are accounted for.

## 2019-05-21 NOTE — Progress Notes (Signed)
Pt MRI is negative for stroke as per neurology.

## 2019-06-16 ENCOUNTER — Observation Stay
Admission: EM | Admit: 2019-06-16 | Discharge: 2019-06-17 | Disposition: A | Discharge status: Home / Self Care | Payer: Medicare Other | Attending: Internal Medicine | Admitting: Internal Medicine

## 2019-06-16 ENCOUNTER — Emergency Department: Payer: Medicare Other

## 2019-06-16 ENCOUNTER — Observation Stay: Payer: Medicare Other

## 2019-06-16 DIAGNOSIS — Z7982 Long term (current) use of aspirin: Secondary | ICD-10-CM | POA: Insufficient documentation

## 2019-06-16 DIAGNOSIS — R29810 Facial weakness: Secondary | ICD-10-CM | POA: Insufficient documentation

## 2019-06-16 DIAGNOSIS — I10 Essential (primary) hypertension: Secondary | ICD-10-CM | POA: Insufficient documentation

## 2019-06-16 DIAGNOSIS — E785 Hyperlipidemia, unspecified: Secondary | ICD-10-CM | POA: Insufficient documentation

## 2019-06-16 DIAGNOSIS — R079 Chest pain, unspecified: Secondary | ICD-10-CM | POA: Insufficient documentation

## 2019-06-16 DIAGNOSIS — D649 Anemia, unspecified: Secondary | ICD-10-CM | POA: Insufficient documentation

## 2019-06-16 DIAGNOSIS — F32A Depression, unspecified: Secondary | ICD-10-CM | POA: Diagnosis present

## 2019-06-16 DIAGNOSIS — R0602 Shortness of breath: Secondary | ICD-10-CM | POA: Insufficient documentation

## 2019-06-16 DIAGNOSIS — G40909 Epilepsy, unspecified, not intractable, without status epilepticus: Secondary | ICD-10-CM | POA: Diagnosis present

## 2019-06-16 DIAGNOSIS — G43009 Migraine without aura, not intractable, without status migrainosus: Secondary | ICD-10-CM

## 2019-06-16 DIAGNOSIS — Z8709 Personal history of other diseases of the respiratory system: Secondary | ICD-10-CM

## 2019-06-16 DIAGNOSIS — J449 Chronic obstructive pulmonary disease, unspecified: Secondary | ICD-10-CM | POA: Insufficient documentation

## 2019-06-16 DIAGNOSIS — Z8719 Personal history of other diseases of the digestive system: Secondary | ICD-10-CM

## 2019-06-16 DIAGNOSIS — E119 Type 2 diabetes mellitus without complications: Secondary | ICD-10-CM | POA: Insufficient documentation

## 2019-06-16 DIAGNOSIS — I480 Paroxysmal atrial fibrillation: Secondary | ICD-10-CM | POA: Insufficient documentation

## 2019-06-16 DIAGNOSIS — G40A09 Absence epileptic syndrome, not intractable, without status epilepticus: Secondary | ICD-10-CM | POA: Insufficient documentation

## 2019-06-16 DIAGNOSIS — R55 Syncope and collapse: Secondary | ICD-10-CM | POA: Insufficient documentation

## 2019-06-16 DIAGNOSIS — Z7901 Long term (current) use of anticoagulants: Secondary | ICD-10-CM | POA: Insufficient documentation

## 2019-06-16 DIAGNOSIS — R531 Weakness: Principal | ICD-10-CM | POA: Insufficient documentation

## 2019-06-16 DIAGNOSIS — Z8673 Personal history of transient ischemic attack (TIA), and cerebral infarction without residual deficits: Secondary | ICD-10-CM | POA: Insufficient documentation

## 2019-06-16 DIAGNOSIS — E1169 Type 2 diabetes mellitus with other specified complication: Secondary | ICD-10-CM

## 2019-06-16 LAB — COMPREHENSIVE METABOLIC PANEL
ALT: 11 U/L (ref 0–55)
AST (SGOT): 12 U/L (ref 5–34)
Albumin/Globulin Ratio: 1.3 (ref 0.9–2.2)
Albumin: 3.4 g/dL — ABNORMAL LOW (ref 3.5–5.0)
Alkaline Phosphatase: 78 U/L (ref 37–106)
Anion Gap: 12 (ref 5.0–15.0)
BUN: 6 mg/dL — ABNORMAL LOW (ref 7–19)
Bilirubin, Total: 0.2 mg/dL (ref 0.2–1.2)
CO2: 25 mEq/L (ref 22–29)
Calcium: 8.9 mg/dL (ref 8.5–10.5)
Chloride: 106 mEq/L (ref 100–111)
Creatinine: 0.8 mg/dL (ref 0.6–1.0)
Globulin: 2.7 g/dL (ref 2.0–3.6)
Glucose: 98 mg/dL (ref 70–100)
Potassium: 3.7 mEq/L (ref 3.5–5.1)
Protein, Total: 6.1 g/dL (ref 6.0–8.3)
Sodium: 143 mEq/L (ref 136–145)

## 2019-06-16 LAB — CBC AND DIFFERENTIAL
Absolute NRBC: 0 10*3/uL (ref 0.00–0.00)
Basophils Absolute Automated: 0.04 10*3/uL (ref 0.00–0.08)
Basophils Automated: 0.6 %
Eosinophils Absolute Automated: 0.31 10*3/uL (ref 0.00–0.44)
Eosinophils Automated: 4.4 %
Hematocrit: 31.5 % — ABNORMAL LOW (ref 34.7–43.7)
Hgb: 10.3 g/dL — ABNORMAL LOW (ref 11.4–14.8)
Immature Granulocytes Absolute: 0.04 10*3/uL (ref 0.00–0.07)
Immature Granulocytes: 0.6 %
Lymphocytes Absolute Automated: 1.88 10*3/uL (ref 0.42–3.22)
Lymphocytes Automated: 26.7 %
MCH: 28.1 pg (ref 25.1–33.5)
MCHC: 32.7 g/dL (ref 31.5–35.8)
MCV: 86.1 fL (ref 78.0–96.0)
MPV: 9.7 fL (ref 8.9–12.5)
Monocytes Absolute Automated: 0.51 10*3/uL (ref 0.21–0.85)
Monocytes: 7.2 %
Neutrophils Absolute: 4.26 10*3/uL (ref 1.10–6.33)
Neutrophils: 60.5 %
Nucleated RBC: 0 /100 WBC (ref 0.0–0.0)
Platelets: 260 10*3/uL (ref 142–346)
RBC: 3.66 10*6/uL — ABNORMAL LOW (ref 3.90–5.10)
RDW: 13 % (ref 11–15)
WBC: 7.04 10*3/uL (ref 3.10–9.50)

## 2019-06-16 LAB — URINALYSIS, REFLEX TO MICROSCOPIC EXAM IF INDICATED
Bilirubin, UA: NEGATIVE
Glucose, UA: NEGATIVE
Ketones UA: NEGATIVE
Leukocyte Esterase, UA: NEGATIVE
Nitrite, UA: NEGATIVE
Protein, UR: NEGATIVE
Specific Gravity UA: 1.008 (ref 1.001–1.035)
Urine pH: 6 (ref 5.0–8.0)
Urobilinogen, UA: NEGATIVE mg/dL (ref 0.2–2.0)

## 2019-06-16 LAB — GLUCOSE WHOLE BLOOD - POCT
Whole Blood Glucose POCT: 92 mg/dL (ref 70–100)
Whole Blood Glucose POCT: 90 mg/dL (ref 70–100)

## 2019-06-16 LAB — PT AND APTT
PT INR: 1.1 (ref 0.9–1.1)
PT: 14.1 s (ref 12.6–15.0)
PTT: 35 s (ref 23–37)

## 2019-06-16 LAB — B-TYPE NATRIURETIC PEPTIDE: B-Natriuretic Peptide: <10 pg/mL (ref 0–100)

## 2019-06-16 LAB — GFR: EGFR: >60

## 2019-06-16 LAB — TROPONIN I: Troponin I: <0.01 ng/mL (ref 0.00–0.05)

## 2019-06-16 MED ORDER — ATORVASTATIN CALCIUM 10 MG PO TABS
40.0000 mg | ORAL_TABLET | Freq: Every day | ORAL | Status: DC
Start: 2019-06-16 — End: 2019-06-17
  Administered 2019-06-17: 40 mg via ORAL
  Filled 2019-06-16: qty 4

## 2019-06-16 MED ORDER — LEVETIRACETAM 250 MG PO TABS
500.0000 mg | ORAL_TABLET | Freq: Two times a day (BID) | ORAL | Status: DC
Start: 2019-06-16 — End: 2019-06-17
  Administered 2019-06-17: 500 mg via ORAL
  Filled 2019-06-16: qty 2

## 2019-06-16 MED ORDER — DEXTROSE-NACL 5-0.9 % IV SOLN
INTRAVENOUS | Status: DC
Start: 2019-06-16 — End: 2019-06-17

## 2019-06-16 MED ORDER — DIPHENHYDRAMINE HCL 50 MG/ML IJ SOLN
25.00 mg | Freq: Once | INTRAMUSCULAR | Status: AC
Start: 2019-06-16 — End: 2019-06-16
  Administered 2019-06-16: 25 mg via INTRAVENOUS
  Filled 2019-06-16: qty 1

## 2019-06-16 MED ORDER — GLUCAGON 1 MG IJ SOLR (WRAP)
1.00 mg | INTRAMUSCULAR | Status: DC | PRN
Start: 2019-06-16 — End: 2019-06-17

## 2019-06-16 MED ORDER — DEXTROSE 50 % IV SOLN
12.50 g | INTRAVENOUS | Status: DC | PRN
Start: 2019-06-16 — End: 2019-06-17

## 2019-06-16 MED ORDER — QUETIAPINE FUMARATE 25 MG PO TABS
50.0000 mg | ORAL_TABLET | Freq: Every evening | ORAL | Status: DC
Start: 2019-06-16 — End: 2019-06-17

## 2019-06-16 MED ORDER — APIXABAN 5 MG PO TABS
5.0000 mg | ORAL_TABLET | Freq: Two times a day (BID) | ORAL | Status: DC
Start: 2019-06-16 — End: 2019-06-17
  Administered 2019-06-17: 5 mg via ORAL
  Filled 2019-06-16: qty 1

## 2019-06-16 MED ORDER — BUTALBITAL-APAP-CAFFEINE 50-325-40 MG PO TABS
1.0000 | ORAL_TABLET | Freq: Once | ORAL | Status: DC
Start: 2019-06-16 — End: 2019-06-16

## 2019-06-16 MED ORDER — ONDANSETRON HCL 4 MG/2ML IJ SOLN
4.00 mg | Freq: Once | INTRAMUSCULAR | Status: AC
Start: 2019-06-16 — End: 2019-06-16
  Administered 2019-06-16: 4 mg via INTRAVENOUS
  Filled 2019-06-16: qty 2

## 2019-06-16 MED ORDER — LORAZEPAM 0.5 MG PO TABS
0.5000 mg | ORAL_TABLET | Freq: Every day | ORAL | Status: DC | PRN
Start: 2019-06-16 — End: 2019-06-17

## 2019-06-16 MED ORDER — PSYLLIUM 58.12 % PO PACK
1.00 | PACK | Freq: Every day | ORAL | Status: DC
Start: 2019-06-17 — End: 2019-06-17
  Administered 2019-06-17: 1 via ORAL
  Filled 2019-06-16: qty 1

## 2019-06-16 MED ORDER — ONDANSETRON HCL 4 MG/2ML IJ SOLN
4.00 mg | Freq: Four times a day (QID) | INTRAMUSCULAR | Status: DC | PRN
Start: 2019-06-16 — End: 2019-06-17
  Administered 2019-06-17: 4 mg via INTRAVENOUS
  Filled 2019-06-16: qty 2

## 2019-06-16 MED ORDER — FUROSEMIDE 20 MG PO TABS
20.0000 mg | ORAL_TABLET | Freq: Every day | ORAL | Status: DC
Start: 2019-06-16 — End: 2019-06-17
  Administered 2019-06-17: 20 mg via ORAL
  Filled 2019-06-16: qty 1

## 2019-06-16 MED ORDER — ONDANSETRON 4 MG PO TBDP
4.00 mg | ORAL_TABLET | Freq: Four times a day (QID) | ORAL | Status: DC | PRN
Start: 2019-06-16 — End: 2019-06-17

## 2019-06-16 MED ORDER — METOPROLOL SUCCINATE ER 50 MG PO TB24
25.00 mg | ORAL_TABLET | Freq: Every day | ORAL | Status: DC
Start: 2019-06-16 — End: 2019-06-17
  Administered 2019-06-17: 25 mg via ORAL
  Filled 2019-06-16: qty 1

## 2019-06-16 MED ORDER — ACETAMINOPHEN 325 MG PO TABS
650.0000 mg | ORAL_TABLET | Freq: Four times a day (QID) | ORAL | Status: DC | PRN
Start: 2019-06-16 — End: 2019-06-17

## 2019-06-16 MED ORDER — GLUCOSE 40 % PO GEL
15.00 g | ORAL | Status: DC | PRN
Start: 2019-06-16 — End: 2019-06-17

## 2019-06-16 MED ORDER — DIPHENHYDRAMINE HCL 50 MG/ML IJ SOLN
25.00 mg | Freq: Once | INTRAMUSCULAR | Status: DC
Start: 2019-06-16 — End: 2019-06-16

## 2019-06-16 MED ORDER — RISAQUAD PO CAPS
1.00 | ORAL_CAPSULE | Freq: Every day | ORAL | Status: DC
Start: 2019-06-17 — End: 2019-06-17
  Administered 2019-06-17: 1 via ORAL
  Filled 2019-06-16: qty 1

## 2019-06-16 MED ORDER — PANTOPRAZOLE SODIUM 40 MG PO TBEC
40.00 mg | DELAYED_RELEASE_TABLET | Freq: Every morning | ORAL | Status: DC
Start: 2019-06-17 — End: 2019-06-17
  Administered 2019-06-17: 40 mg via ORAL
  Filled 2019-06-16: qty 1

## 2019-06-16 MED ORDER — LAMOTRIGINE 25 MG PO TABS
150.0000 mg | ORAL_TABLET | Freq: Two times a day (BID) | ORAL | Status: DC
Start: 2019-06-16 — End: 2019-06-17
  Administered 2019-06-17: 150 mg via ORAL
  Filled 2019-06-16 (×3): qty 2

## 2019-06-16 MED ORDER — ASPIRIN 325 MG PO TABS
325.00 mg | ORAL_TABLET | Freq: Every day | ORAL | Status: AC
Start: 2019-06-16 — End: 2019-06-17

## 2019-06-16 MED ORDER — GADOBUTROL 1 MMOL/ML IV SOLN
10.00 mL | Freq: Once | INTRAVENOUS | Status: AC | PRN
Start: 2019-06-16 — End: 2019-06-16
  Administered 2019-06-16: 10 mmol via INTRAVENOUS

## 2019-06-16 MED ORDER — BUTALBITAL-APAP-CAFFEINE 50-325-40 MG PO TABS
1.00 | ORAL_TABLET | Freq: Once | ORAL | Status: AC
Start: 2019-06-16 — End: 2019-06-16
  Administered 2019-06-16: 1 via ORAL
  Filled 2019-06-16: qty 1

## 2019-06-16 MED ORDER — AMLODIPINE BESYLATE 5 MG PO TABS
2.5000 mg | ORAL_TABLET | Freq: Every day | ORAL | Status: DC
Start: 2019-06-16 — End: 2019-06-17
  Administered 2019-06-17: 2.5 mg via ORAL
  Filled 2019-06-16: qty 1

## 2019-06-16 MED ORDER — ALBUTEROL SULFATE HFA 108 (90 BASE) MCG/ACT IN AERS
1.00 | INHALATION_SPRAY | Freq: Four times a day (QID) | RESPIRATORY_TRACT | Status: DC
Start: 2019-06-16 — End: 2019-06-17
  Administered 2019-06-16 – 2019-06-17 (×3): 1 via RESPIRATORY_TRACT
  Filled 2019-06-16: qty 8

## 2019-06-16 MED ORDER — INSULIN LISPRO 100 UNIT/ML SC SOLN
1.00 [IU] | Freq: Three times a day (TID) | SUBCUTANEOUS | Status: DC
Start: 2019-06-17 — End: 2019-06-17

## 2019-06-16 MED ORDER — METOCLOPRAMIDE HCL 5 MG/ML IJ SOLN
10.00 mg | Freq: Once | INTRAMUSCULAR | Status: AC
Start: 2019-06-16 — End: 2019-06-16
  Administered 2019-06-16: 10 mg via INTRAVENOUS
  Filled 2019-06-16: qty 2

## 2019-06-16 MED ORDER — ROPINIROLE HCL 1 MG PO TABS
1.0000 mg | ORAL_TABLET | Freq: Every evening | ORAL | Status: DC
Start: 2019-06-16 — End: 2019-06-17
  Filled 2019-06-16 (×2): qty 1

## 2019-06-16 MED ORDER — LORAZEPAM 2 MG/ML IJ SOLN
1.00 mg | Freq: Once | INTRAMUSCULAR | Status: AC
Start: 2019-06-16 — End: 2019-06-16
  Administered 2019-06-16: 1 mg via INTRAVENOUS
  Filled 2019-06-16: qty 1

## 2019-06-16 MED ORDER — INSULIN LISPRO 100 UNIT/ML SC SOLN
1.00 [IU] | Freq: Every evening | SUBCUTANEOUS | Status: DC
Start: 2019-06-16 — End: 2019-06-17

## 2019-06-16 MED ORDER — MAGNESIUM SULFATE IN D5W 1-5 GM/100ML-% IV SOLN
1.00 g | Freq: Once | INTRAVENOUS | Status: AC
Start: 2019-06-16 — End: 2019-06-16
  Administered 2019-06-16: 1 g via INTRAVENOUS
  Filled 2019-06-16: qty 100

## 2019-06-16 MED ORDER — NALOXONE HCL 0.4 MG/ML IJ SOLN (WRAP)
0.20 mg | INTRAMUSCULAR | Status: DC | PRN
Start: 2019-06-16 — End: 2019-06-17

## 2019-06-16 MED ORDER — ACETAMINOPHEN 650 MG RE SUPP
650.00 mg | Freq: Four times a day (QID) | RECTAL | Status: DC | PRN
Start: 2019-06-16 — End: 2019-06-17

## 2019-06-16 MED ORDER — BUDESONIDE-FORMOTEROL FUMARATE 80-4.5 MCG/ACT IN AERO
2.00 | INHALATION_SPRAY | Freq: Two times a day (BID) | RESPIRATORY_TRACT | Status: DC
Start: 2019-06-16 — End: 2019-06-17
  Administered 2019-06-16 – 2019-06-17 (×2): 2 via RESPIRATORY_TRACT
  Filled 2019-06-16: qty 6.9

## 2019-06-16 NOTE — ED Notes (Signed)
Blood sugar 92

## 2019-06-16 NOTE — ED Notes (Signed)
Attempted report x1. 

## 2019-06-16 NOTE — H&P (Signed)
ADMISSION HISTORY AND PHYSICAL EXAM    [facility], DIVISION OF HOSPITALIST MEDICINE   [facility]   [place] Pager: [phone]      Date Time: 06/16/19 6:53 PM  Patient Name: [name]  Attending Physician: No att. providers found  Primary Care Physician: Malcolm Metro, MD    CC: left sided weakness     Assessment:     Active Hospital Problems    Diagnosis   . History of asthma   . History of transient ischemic attack (TIA)   . Type 2 diabetes mellitus   . Syncope and collapse   . Left-sided weakness   . Paroxysmal atrial fibrillation   . Hypertension   . Seizure disorder   . Depression   . Anemia   . Chest pain           Plan:   -Admit to [facility] Hospitalist service    Left-sided weakness rule out acute stroke  -Neuro check  -Follow-up PT MRI of the brain  -Follow-up vitamin B12 and TSH level  -Follow-up neurology recommendation  -Continue Eliquis, lipitor   - Echo 06/2018  Showed Normal EF,mod AR    Paroxysmal atrial fibrillation  -Continue rate control with metoprolol   -Continue Eliquis    Syncope episode  - cont' tele monitoring  -Could be due to arrhythmia and hypotension    Chest pain  - F/U cardiac enzymes  - cont' BB,eliquis, statin    History of seizure and TIA  -Continue Keppra      Diabetes  -Diet control  -ssi, FS monitoring  -Follow-up hemoglobin A1c    History of depression and anxiety  -Continue  Seroquel , Lamictal, Ativan    History of asthma/COPD   -Currently stable with no wheezing  -Inhaler as needed    H/O GI bleed  - cont' protonix  - CBC monittoring      Disposition:     Anticipated medical stability for discharge: Per medical improvement  Service status: Observation    History of Presenting Illness:   [name] is a 63 y.o. female presenting with PMH of COPD/Asthma,HTN,DM control with Diet, TIA,Seizure , depression anxiety presented to emergency room for syncope episode, left-sided weakness, right facial droop.  She woke up this morning , [name] out of the  bed, she had dizziness episode with fainting.  She did not she did not have seizure or fever.  Later on she developed right facial droop and left upper and lower extremity numbness and weakness with no slurred speech.  Also she developed chest pain at the sternal area.  Work-up in the emergency room showed unremarkable chest x-ray and CT of head normal troponin level.  Now she is going to be admitted for further evaluation and treatment    Past Medical History:     Past Medical History:   Diagnosis Date   . Anemia    . Asthma    . Atrial fibrillation    . Chronic obstructive pulmonary disease    . Convulsions    . Depression    . Diabetes mellitus     diet controlled    . Hyperlipidemia    . Hypertension    . TIA (transient ischemic attack)          Available old records reviewed, including: EPIC     Past Surgical History:     Past Surgical History:   Procedure Laterality Date   . CARDIAC CATHETERIZATION  2017   .  HYSTERECTOMY     . pci         Family History:   History reviewed. No pertinent family history.    Social History:    reports that she has never smoked. She has never used smokeless tobacco. She reports that she does not drink alcohol or use drugs.    Allergies:     Allergies   Allergen Reactions   . Singulair [Montelukast] Itching   . Myoview [Technetium-86m] Itching   . Contrast [Iodinated Diagnostic Agents]    . Latex    . Nitroglycerin    . Penicillins    . Tetracyclines & Related        Medications:     Home Medications     Med List Status:  In Progress Set By: Sharen Hones, RN at 06/16/2019  1:53 PM                acetaminophen (TYLENOL) 325 MG tablet     Take 2 tablets (650 mg total) by mouth every 4 (four) hours as needed for Pain     albuterol (PROVENTIL HFA) 108 (90 Base) MCG/ACT inhaler     Inhale 1 puff into the lungs daily Also take it PRN every 20 minutes.     amLODIPine (NORVASC) 2.5 MG tablet     Take 1 tablet (2.5 mg total) by mouth daily     apixaban (ELIQUIS) 5 MG     Take 1 tablet (5 mg  total) by mouth every 12 (twelve) hours     atorvastatin (LIPITOR) 40 MG tablet     Take 40 mg by mouth daily     budesonide-formoterol (SYMBICORT) 80-4.5 MCG/ACT inhaler     Inhale 2 puffs into the lungs daily     furosemide (LASIX) 20 MG tablet     Take 1 tablet (20 mg total) by mouth daily     lactobacillus/streptococcus (RISAQUAD) Cap     Take 1 capsule by mouth daily     lamoTRIgine (LAMICTAL) 150 MG tablet     Take 1 tablet (150 mg total) by mouth 2 (two) times daily     levETIRAcetam (KEPPRA) 500 MG tablet     Take 500 mg by mouth 2 (two) times daily     LORazepam (Ativan) 0.5 MG tablet     Take 0.5 mg by mouth daily as needed for Anxiety     Lurasidone HCl (Latuda) 40 MG Tab     Take 40 mg by mouth nightly     metoprolol succinate XL (TOPROL-XL) 25 MG 24 hr tablet     Take 25 mg by mouth daily     pantoprazole (PROTONIX) 40 MG tablet     Take 1 tablet (40 mg total) by mouth daily     psyllium (METAMUCIL) 58.12 % Pack packet     Take 1 packet by mouth daily     QUEtiapine (SEROQUEL) 50 MG tablet     Take 1 tablet (50 mg total) by mouth nightly     rOPINIRole (REQUIP) 1 MG tablet     Take 1 tablet (1 mg total) by mouth nightly          Method by which medications were confirmed on admission:       Review of Systems:   All other systems were reviewed and are negative except:as above in HPI     Physical Exam:     Patient Vitals for the past 24 hrs:   BP Temp Temp  src Pulse Resp SpO2 Height Weight   06/16/19 1646 122/63 98.3 F (36.8 C) - 79 14 97 % - -   06/16/19 1539 134/66 98.8 F (37.1 C) Oral 80 20 100 % - -   06/16/19 1423 - - - - - - - 108 kg (238 lb 1.6 oz)   06/16/19 1351 140/66 97.4 F (36.3 C) Tympanic 90 18 96 % 1.651 m (5\' 5" ) 105.1 kg (231 lb 11.3 oz)     Body mass index is 39.62 kg/m.  No intake or output data in the 24 hours ending 06/16/19 1853    General: awake; no acute distress.  HEENT: perrla, eomi, sclera anicteric  oropharynx clear without lesions, mucous membranes moist  Neck: supple,  no lymphadenopathy, no JVD, no carotid bruits  Cardiovascular:  S1 and S2, no murmurs, rubs or gallops  Lungs: clear to auscultation bilaterally, without wheezing, rhonchi, or rales  Abdomen: soft, non-tender, non-distended; no palpable masses, no hepatosplenomegaly, normoactive bowel sounds, no rebound or guarding  Extremities: no clubbing, cyanosis, or edema  Neuro: alert, oriented x 3, s/p Rt facial droop  strength 4/5 in upper and lower extremities,   Skin: no rashes or lesions noted        Labs:     Results     Procedure Component Value Units Date/Time    UA, Reflex to Microscopic [[id number]]  (Abnormal) Collected:  06/16/19 1633    Specimen:  Urine Updated:  06/16/19 1650     Urine Type Clean Catch     Color, UA Straw     Clarity, UA Clear     Specific Gravity UA 1.008     Urine pH 6.0     Leukocyte Esterase, UA Negative     Nitrite, UA Negative     Protein, UR Negative     Glucose, UA Negative     Ketones UA Negative     Urobilinogen, UA Negative mg/dL      Bilirubin, UA Negative     Blood, UA Small     RBC, UA 0 - 2 /hpf      WBC, UA 0 - 5 /hpf      Squamous Epithelial Cells, Urine 0 - 5 /hpf     B-type Natriuretic Peptide [[id number]] Collected:  06/16/19 1501    Specimen:  Blood Updated:  06/16/19 1559     B-Natriuretic Peptide <10 pg/mL     Troponin I [[id number]] Collected:  06/16/19 1501    Specimen:  Blood Updated:  06/16/19 1552     Troponin I <0.01 ng/mL     GFR [[id number]] Collected:  06/16/19 1501     Updated:  06/16/19 1546     EGFR >60.0    Comprehensive metabolic panel [[id number]]  (Abnormal) Collected:  06/16/19 1501    Specimen:  Blood Updated:  06/16/19 1546     Glucose 98 mg/dL      BUN 6 mg/dL      Creatinine 0.8 mg/dL      Sodium 563 mEq/L      Potassium 3.7 mEq/L      Chloride 106 mEq/L      CO2 25 mEq/L      Calcium 8.9 mg/dL      Protein, Total 6.1 g/dL      Albumin 3.4 g/dL      AST (SGOT) 12 U/L      ALT 11 U/L      Alkaline Phosphatase 78 U/L  Bilirubin, Total 0.2 mg/dL       Globulin 2.7 g/dL      Albumin/Globulin Ratio 1.3     Anion Gap 12.0    PT/APTT [[id number]] Collected:  06/16/19 1501     Updated:  06/16/19 1540     PT 14.1 sec      PT INR 1.1     PTT 35 sec     CBC and differential [[id number]]  (Abnormal) Collected:  06/16/19 1501    Specimen:  Blood Updated:  06/16/19 1521     WBC 7.04 x10 3/uL      Hgb 10.3 g/dL      Hematocrit 78.2 %      Platelets 260 x10 3/uL      RBC 3.66 x10 6/uL      MCV 86.1 fL      MCH 28.1 pg      MCHC 32.7 g/dL      RDW 13 %      MPV 9.7 fL      Neutrophils 60.5 %      Lymphocytes Automated 26.7 %      Monocytes 7.2 %      Eosinophils Automated 4.4 %      Basophils Automated 0.6 %      Immature Granulocytes 0.6 %      Nucleated RBC 0.0 /100 WBC      Neutrophils Absolute 4.26 x10 3/uL      Lymphocytes Absolute Automated 1.88 x10 3/uL      Monocytes Absolute Automated 0.51 x10 3/uL      Eosinophils Absolute Automated 0.31 x10 3/uL      Basophils Absolute Automated 0.04 x10 3/uL      Immature Granulocytes Absolute 0.04 x10 3/uL      Absolute NRBC 0.00 x10 3/uL     Glucose Whole Blood - POCT [[id number]] Collected:  06/16/19 1423     Updated:  06/16/19 1425     Whole Blood Glucose POCT 92 mg/dL           Imaging personally reviewed, including: all available   Ct Head Wo Contrast    Result Date: 06/16/2019   No acute process seen. Stable exam. Charlene Brooke, MD  06/16/2019 2:37 PM    Mr Angiogram Head Wo Contrast    Result Date: 06/16/2019   No evidence of significant stenosis within the circle of Willis.  END OF IMPRESSION  Gustavus Messing, MD  06/16/2019 6:48 PM    Chest Ap Portable    Result Date: 06/16/2019   No acute process seen. Charlene Brooke, MD  06/16/2019 3:14 PM    .    Signed by: Yates Decamp, MD   cc:Malcolm Metro, MD

## 2019-06-16 NOTE — Progress Notes (Signed)
Pt arrived to unit from ED and MRI. Pt alert and oriented x4. No distress noted while on room air. Telemetry initiated. Pt complains of 10/10 headache to frontal lobe. Pt only has orders for Tylenol at this time. Pt refusing tylenol. Pt made comfortable and oriented to room. Pt given food from kitchen due to kitchen being closed.  Report given to Amma RN who will follow up with patient pain.     Pt MRI/MRA results are still pending.

## 2019-06-16 NOTE — ED Notes (Signed)
Bed: E29  Expected date:   Expected time:   Means of arrival:   Comments:

## 2019-06-16 NOTE — ED Provider Notes (Signed)
EMERGENCY DEPARTMENT NOTE       HISTORY OF PRESENT ILLNESS   Historian:[name]  Translator Used: no    Chief Complaint: Chest Pain and Headache     63 y.o. female with h/o A. Fib, seizure, anemia, asthma, COPD,  DM, TIA/CVA, HLD, HTN, and depression who presents the ED for evaluation of chest pain, headache, and left sided weakness. [name] reports she woke up this morning with generalized weakness causing her to fall and hit her head. Denies LOC. [name] reports 1 hour PTA she developed a headache, chest pain, left sided weakness, and difficulty finding words. Denies fever/chills, [name] reports previos TIA/CVA denies any residual deficits. Ambulates with a cane at baseline. [name] currently anticoagulated on Eliquis for A-Fib.     1. Location of symptoms: neuro, chest  2. Onset of symptoms: this morning  3. What was [name] doing when symptoms started (Context): see above  4. Severity: moderate  5. Timing: constant  6. Activities that worsen symptoms:  nothing  7. Activities that improve symptoms: nothing  8. Quality: throbbing  9. Radiation of symptoms: no  10. Associated signs and Symptoms: see above  11. Are symptoms worsening? yes  MEDICAL HISTORY     Past Medical History:  Past Medical History:   Diagnosis Date   . Anemia    . Asthma    . Atrial fibrillation    . Chronic obstructive pulmonary disease    . Convulsions    . Depression    . Diabetes mellitus     diet controlled    . Hyperlipidemia    . Hypertension    . TIA (transient ischemic attack)        Past Surgical History:  Past Surgical History:   Procedure Laterality Date   . CARDIAC CATHETERIZATION  2017   . HYSTERECTOMY     . pci         Social History:  Social History     Socioeconomic History   . Marital status: Divorced     Spouse name: Not on file   . Number of children: Not on file   . Years of education: Not on file   . Highest education level: Not on file   Occupational History   . Not on file   Social Needs   . Financial resource strain:  Not on file   . Food insecurity     Worry: Not on file     Inability: Not on file   . Transportation needs     Medical: Not on file     Non-medical: Not on file   Tobacco Use   . Smoking status: Never Smoker   . Smokeless tobacco: Never Used   Substance and Sexual Activity   . Alcohol use: Never     Frequency: Never   . Drug use: Never   . Sexual activity: Not on file   Lifestyle   . Physical activity     Days per week: Not on file     Minutes per session: Not on file   . Stress: Not on file   Relationships   . Social Wellsite geologist on phone: Not on file     Gets together: Not on file     Attends religious service: Not on file     Active member of club or organization: Not on file     Attends meetings of clubs or organizations: Not on file     Relationship status:  Not on file   . Intimate partner violence     Fear of current or ex partner: Not on file     Emotionally abused: Not on file     Physically abused: Not on file     Forced sexual activity: Not on file   Other Topics Concern   . Not on file   Social History Narrative   . Not on file       Family History:  History reviewed. No pertinent family history.    Outpatient Medication:  Discharge Medication List as of 06/17/2019  4:20 PM      CONTINUE these medications which have NOT CHANGED    Details   acetaminophen (TYLENOL) 325 MG tablet Take 2 tablets (650 mg total) by mouth every 4 (four) hours as needed for Pain, Starting Wed 08/29/2018, Print      albuterol (PROVENTIL HFA) 108 (90 Base) MCG/ACT inhaler Inhale 1 puff into the lungs daily Also take it PRN every 20 minutes., Historical Med      amLODIPine (NORVASC) 2.5 MG tablet Take 1 tablet (2.5 mg total) by mouth daily, Starting Thu 01/10/2019, Print      apixaban (ELIQUIS) 5 MG Take 1 tablet (5 mg total) by mouth every 12 (twelve) hours, Starting Wed 01/09/2019, Print      atorvastatin (LIPITOR) 40 MG tablet Take 40 mg by mouth daily, Historical Med      budesonide-formoterol (SYMBICORT) 80-4.5 MCG/ACT  inhaler Inhale 2 puffs into the lungs daily, Historical Med      furosemide (LASIX) 20 MG tablet Take 1 tablet (20 mg total) by mouth daily, Starting Thu 08/30/2018, Print      lactobacillus/streptococcus (RISAQUAD) Cap Take 1 capsule by mouth daily, Starting Wed 01/09/2019, Print      lamoTRIgine (LAMICTAL) 150 MG tablet Take 1 tablet (150 mg total) by mouth 2 (two) times daily, Starting Wed 08/29/2018, Print      levETIRAcetam (KEPPRA) 500 MG tablet Take 500 mg by mouth 2 (two) times daily, Historical Med      LORazepam (Ativan) 0.5 MG tablet Take 0.5 mg by mouth daily as needed for Anxiety, Historical Med      Lurasidone HCl (Latuda) 40 MG Tab Take 40 mg by mouth nightly, Historical Med      metoprolol succinate XL (TOPROL-XL) 25 MG 24 hr tablet Take 25 mg by mouth daily, Historical Med      pantoprazole (PROTONIX) 40 MG tablet Take 1 tablet (40 mg total) by mouth daily, Starting Wed 08/29/2018, Print      psyllium (METAMUCIL) 58.12 % Pack packet Take 1 packet by mouth daily, Starting Mon 05/13/2019, Print      QUEtiapine (SEROQUEL) 50 MG tablet Take 1 tablet (50 mg total) by mouth nightly, Starting Wed 08/29/2018, Print      rOPINIRole (REQUIP) 1 MG tablet Take 1 tablet (1 mg total) by mouth nightly, Starting Wed 08/29/2018, Print               REVIEW OF SYSTEMS   Review of Systems   Constitutional: Negative for chills and fever.   HENT: Negative for sore throat.    Eyes: Negative.    Respiratory: Negative.  Negative for cough and shortness of breath.    Cardiovascular: Positive for chest pain. Negative for palpitations and leg swelling.   Gastrointestinal: Negative for abdominal pain, nausea and vomiting.   Genitourinary: Negative.    Musculoskeletal: Negative.    Neurological: Positive for focal weakness (left sided) and  headaches. Negative for loss of consciousness.   All other systems reviewed and are negative.    PHYSICAL EXAM     ED Triage Vitals [06/16/19 1351]   Enc Vitals Group      BP 140/66      Heart Rate  90      Resp Rate 18      Temp 97.4 F (36.3 C)      Temp Source Tympanic      SpO2 96 %      Weight 105.1 kg      Height 1.651 m      Head Circumference       Peak Flow       Pain Score 10      Pain Loc       Pain Edu?       Excl. in GC?      Nursing note and vitals reviewed.  Constitutional: Well developed, well nourished. No distress noted.  Head: Atraumatic. Normocephalic.   Eyes: PERRL. EOMI. Conjunctivae are not pale.  ENT: Mucous membranes are moist and intact. Oropharynx is clear and symmetric. Patent airway.  Neck: Supple. Full ROM.   Cardiovascular: Regular rate. Regular rhythm. No murmurs, rubs, or gallops.  Pulmonary/Chest: No evidence of respiratory distress.Clear to auscultation bilaterally. No wheezing, rales or rhonchi.   Abdominal: Soft and non-distended. There is no tenderness, rebound, guarding, or rigidity.   Back: Full ROM. Nontender.  Extremities: No edema. No cyanosis. No clubbing. Full range of motion in all extremities.  Skin: Skin is warm and dry. No diaphoresis. No rash.   Neurological: Alert, awake, and appropriate. Motor normal. Right sided facial droop. Cranial nerves II-XII are intact.  Strength is 4/5 in the left upper and lower and 5/5 in the right upper and lower extremities bilaterally.  No sensory deficits to light touch.  Slight left pronator drift. No aphasia or dysphasia, Normal speech.  Normal cerebellar function during finger-nose-finger and heel to shin.    Psychiatric: Good eye contact. Normal interaction, affect, and behavior.    MEDICAL DECISION MAKING     DISCUSSION      Previous medical records reviewed, [name] seen at [facility] ED yesterday for migraine. CT head with no acute findings. Labs reviewed.     CBC to assess for anemia and infection.  CMP to assess for electrolyte abnormalities and kidney function  Trop r/o ischemia  UA r/o infection and assess for hematuria  EKG r/o STEMI or arrhythmia  IVF for hydration    EKG with normal sinus  rhythm. Labs reviewed. Trop negative. CT head with no acute findings. CXR WNL. UA without evidence of infection.      1510- Case discussed Dr. Moise Boring, Neurology, will consult on [name]. Iniital NIH 4. Does not recommend CTA, recommend admission with MRI and MRA.     DDx includes but is not limited to: CVA/TIA, atypical migraine, migraine, SAH, ACS, GERD, musculoskeletal pain    Reason for not initiating tPA (alteplase) within 4.5 hours of onset of symptoms: tPA use in patients on apixaban (Eliquis), dabigatran (Pradaxa), fondaparinux (Arixtra), rivaroxaban (Xarelto), or any anti-coagulant is not recommended unless tests such as aPTT, INR, platelets, ecarin clotting time, thrombin time, or appropriate direct factor Xa activity assays are normal OR the [name] has not received a dose of these agents for > 48 hours (assuming normal renal metabolizing function). Very mild ischemic stroke symptoms that is judged as non-disabling        ED Course as  of Jun 17 1743   Wynelle Link Jun 16, 2019   1536 Spoke with Dr. Gardiner Sleeper, agreeable to admission for stroke r/o    [SC]      ED Course User Index  [SC] Daune Perch, FNP     NIH Stroke Score      Most Recent Value   [name]'s calculated Stroke Score:  4 filed at 06/16/2019 2000          The [name] is NOT septic.  All labs and vital signs from the current visit have been reviewed and any abnormality that is present is not due to sepsis.    Vital Signs: Reviewed the [name]'s vital signs.   Nursing Notes: Reviewed and utilized available nursing notes.  Medical Records Reviewed: Reviewed available past medical records.  Counseling: The emergency provider has spoken with the [name] and discussed today's findings, in addition to providing specific details for the plan of care.  Questions are answered and there is agreement with the plan.        CARDIAC STUDIES    The following cardiac studies were independently interpreted by the Emergency Medicine NP.  For full cardiac study  results please see chart.    Monitor Strip  Interpreted by ED NP  Rate: 80s  Rhythm: NSR   ST Changes: none    EKG Interpretation:  Signed and interpreted by ED Provider   Time Interpreted: 1352  Comparison: No significant change found when compared to previous EKG on 05/20/2019  Rate: 88  Rhythm: NSR  Axis: normal  Intervals: normal  Blocks: no blocks  ST segments: no ST elevation, NSST-T  Interpretation: NSR, NSST-T    EMERGENCY IMAGING STUDIES    The following imagine studies were independently interpreted by me (emergency physician):    Radiology:  Interpreted by me (ED NP)  Study: Chest Xray  Results: No infiltrate. No pneumothorax. No hemothorax. No cardiomegaly. No CHF.  Impression: No acute intrathoracic abnormality.    RADIOLOGY IMAGING STUDIES      MR Angiogram Neck W WO Contrast   Final Result    Motion limited exam. No evidence of vascular occlusion.      END OF IMPRESSION          Gustavus Messing, MD    06/16/2019 6:56 PM      MRI Brain W WO Contrast   Final Result    Motion limited exam. No evidence of acute intracranial   abnormality.        Moderate diffuse white matter disease, nonspecific but most often   reflects chronic small vessel ischemic changes.      Gustavus Messing, MD    06/16/2019 6:52 PM      MR Angiogram Head WO Contrast   Final Result    No evidence of significant stenosis within the circle of   [place].        END OF IMPRESSION          Gustavus Messing, MD    06/16/2019 6:48 PM      Chest AP Portable   Final Result    No acute process seen.      Charlene Brooke, MD    06/16/2019 3:14 PM      CT Head WO Contrast   Final Result    No acute process seen. Stable exam.      Charlene Brooke, MD    06/16/2019 2:37 PM            PULSE OXIMETRY  Oxygen Saturation by Pulse Oximetry: 98%  Interventions: none  Interpretation:  normal    EMERGENCY DEPT. MEDICATIONS      ED Medication Orders (From admission, onward)    Start Ordered     Status Ordering Provider    06/17/19 0900 06/16/19 1859   lactobacillus/streptococcus (RISAQUAD) capsule 1 capsule  Daily     Route: Oral  Ordered Dose: 1 capsule     Last MAR action:  Given [facility], MAJID    06/17/19 0900 06/16/19 1859  psyllium (METAMUCIL) packet 1 packet  Daily     Route: Oral  Ordered Dose: 1 packet     Last MAR action:  Given [facility], MAJID    06/17/19 0745 06/16/19 1859  pantoprazole (PROTONIX) EC tablet 40 mg  Every morning before breakfast     Route: Oral  Ordered Dose: 40 mg     Last MAR action:  Given [facility], MAJID    06/17/19 0745 06/16/19 1839  insulin lispro (HumaLOG) injection 1-5 Units  3 times daily before meals     Route: Subcutaneous  Ordered Dose: 1-5 Units     Last MAR action:  Not Given [facility], MAJID    06/16/19 2200 06/16/19 1859  QUEtiapine (SEROquel) tablet 50 mg  At bedtime     Route: Oral  Ordered Dose: 50 mg     Last MAR action:  Not Given [facility], MAJID    06/16/19 2200 06/16/19 1859  rOPINIRole (REQUIP) tablet 1 mg  At bedtime     Route: Oral  Ordered Dose: 1 mg     Last MAR action:  Not Given [facility], MAJID    06/16/19 2200 06/16/19 1839  insulin lispro (HumaLOG) injection 1-3 Units  At bedtime     Route: Subcutaneous  Ordered Dose: 1-3 Units     Last MAR action:  Not Given [facility], MAJID    06/16/19 2100 06/16/19 1859  amLODIPine (NORVASC) tablet 2.5 mg  Daily     Route: Oral  Ordered Dose: 2.5 mg     Last MAR action:  Given [facility], MAJID    06/16/19 2100 06/16/19 1859  apixaban (ELIQUIS) tablet 5 mg  Every 12 hours scheduled     Route: Oral  Ordered Dose: 5 mg     Last MAR action:  Given [facility], MAJID    06/16/19 2100 06/16/19 1859  atorvastatin (LIPITOR) tablet 40 mg  Daily     Route: Oral  Ordered Dose: 40 mg     Last MAR action:  Given [facility], MAJID    06/16/19 2100 06/16/19 1859  furosemide (LASIX) tablet 20 mg  Daily     Route: Oral  Ordered Dose: 20 mg     Last MAR action:  Given [facility], MAJID    06/16/19 2100  06/16/19 1859  lamoTRIgine (LaMICtal) tablet 150 mg  Every 12 hours scheduled     Route: Oral  Ordered Dose: 150 mg     Last MAR action:  Given [facility], MAJID    06/16/19 2100 06/16/19 1859  levETIRAcetam (KEPPRA) tablet 500 mg  Every 12 hours scheduled     Route: Oral  Ordered Dose: 500 mg     Last MAR action:  Given [facility], MAJID    06/16/19 2100 06/16/19 1859  metoprolol succinate XL (TOPROL-XL) 24 hr tablet 25 mg  Daily     Route: Oral  Ordered Dose: 25 mg     Last [facility]  action:  Given [facility], MAJID    06/16/19 2000 06/16/19 1859  albuterol sulfate HFA (PROVENTIL) inhaler 1 puff  RT - Every 6 hours while awake     Route: Inhalation  Ordered Dose: 1 puff     Last MAR action:  Given [facility], MAJID    06/16/19 2000 06/16/19 1859  budesonide-formoterol (SYMBICORT) 80-4.5 MCG/ACT inhaler 2 puff  RT - 2 times daily     Route: Inhalation  Ordered Dose: 2 puff     Last MAR action:  Given [facility], MAJID    06/16/19 1859 06/16/19 1859  LORazepam (ATIVAN) tablet 0.5 mg  Daily PRN     Route: Oral  Ordered Dose: 0.5 mg     Acknowledged [facility], MAJID    06/16/19 1844 06/16/19 1845  gadobutrol (GADAVIST) injection SOLN 10 mmol  IMG once as needed     Route: Intravenous  Ordered Dose: 10 mL     Last MAR action:  Imaging Agent Given Daune Perch    06/16/19 1839 06/16/19 1839  dextrose (GLUCOSE) 40 % oral gel 15 g of glucose  As needed     Route: Oral  Ordered Dose: 15 g of glucose     Acknowledged [facility], MAJID    06/16/19 1839 06/16/19 1839  dextrose 50 % bolus 12.5 g  As needed     Route: Intravenous  Ordered Dose: 12.5 g     Acknowledged [facility], MAJID    06/16/19 1839 06/16/19 1839  glucagon (rDNA) (GLUCAGEN) injection 1 mg  As needed     Route: Intramuscular  Ordered Dose: 1 mg     Acknowledged [facility], MAJID    06/16/19 1836 06/16/19 1836  acetaminophen (TYLENOL) tablet 650 mg  Every 6 hours PRN     Route: Oral  Ordered Dose: 650 mg      Acknowledged [facility], MAJID    06/16/19 1836 06/16/19 1836  acetaminophen (TYLENOL) suppository 650 mg  Every 6 hours PRN     Route: Rectal  Ordered Dose: 650 mg     Acknowledged [facility], MAJID    06/16/19 1836 06/16/19 1836  ondansetron (ZOFRAN-ODT) disintegrating tablet 4 mg  Every 6 hours PRN     Route: Oral  Ordered Dose: 4 mg     Last MAR action:  See [facility], MAJID    06/16/19 1836 06/16/19 1836  ondansetron (ZOFRAN) injection 4 mg  Every 6 hours PRN     Route: Intravenous  Ordered Dose: 4 mg     Last MAR action:  Given RAHMANIAN SHAHRI, MAJID    06/16/19 1835 06/16/19 1836  naloxone (NARCAN) injection 0.2 mg  As needed     Route: Intravenous  Ordered Dose: 0.2 mg     Acknowledged [facility], MAJID    06/16/19 1654 06/16/19 1653  LORazepam (ATIVAN) injection 1 mg  Once     Route: Intravenous  Ordered Dose: 1 mg     Last MAR action:  Given Alyda Megna N    06/16/19 1604 06/16/19 1603  aspirin tablet 325 mg  Daily     Route: Oral  Ordered Dose: 325 mg     Last MAR action:  Not Given Daune Perch    06/16/19 1604 06/16/19 1603  diphenhydrAMINE (BENADRYL) injection 25 mg  Once     Route: Intravenous  Ordered Dose: 25 mg     Last MAR action:  Given Adeana Grilliot N  06/16/19 1519 06/16/19 1518  butalbital-acetaminophen-caffeine (FIORICET) 50-325-40 MG per tablet 1 tablet  Once     Route: Oral  Ordered Dose: 1 tablet     Last MAR action:  Given Aadya Kindler N    06/16/19 1507 06/16/19 1506    Once     Route: Intravenous  Ordered Dose: 25 mg     Discontinued Breydon Senters N    06/16/19 1507 06/16/19 1506  ondansetron (ZOFRAN) injection 4 mg  Once     Route: Intravenous  Ordered Dose: 4 mg     Last MAR action:  Given Mallory Enriques N          LABORATORY RESULTS    Ordered and independently interpreted AVAILABLE laboratory tests. Please see results section in chart for full details.  Results for orders placed or performed during the hospital  encounter of 06/16/19   CBC and differential   Result Value Ref Range    WBC 7.04 3.10 - 9.50 x10 3/uL    Hgb 10.3 (L) 11.4 - 14.8 g/dL    Hematocrit 09.8 (L) 34.7 - 43.7 %    Platelets 260 142 - 346 x10 3/uL    RBC 3.66 (L) 3.90 - 5.10 x10 6/uL    MCV 86.1 78.0 - 96.0 fL    MCH 28.1 25.1 - 33.5 pg    MCHC 32.7 31.5 - 35.8 g/dL    RDW 13 11 - 15 %    MPV 9.7 8.9 - 12.5 fL    Neutrophils 60.5 None %    Lymphocytes Automated 26.7 None %    Monocytes 7.2 None %    Eosinophils Automated 4.4 None %    Basophils Automated 0.6 None %    Immature Granulocytes 0.6 None %    Nucleated RBC 0.0 0.0 - 0.0 /100 WBC    Neutrophils Absolute 4.26 1.10 - 6.33 x10 3/uL    Lymphocytes Absolute Automated 1.88 0.42 - 3.22 x10 3/uL    Monocytes Absolute Automated 0.51 0.21 - 0.85 x10 3/uL    Eosinophils Absolute Automated 0.31 0.00 - 0.44 x10 3/uL    Basophils Absolute Automated 0.04 0.00 - 0.08 x10 3/uL    Immature Granulocytes Absolute 0.04 0.00 - 0.07 x10 3/uL    Absolute NRBC 0.00 0.00 - 0.00 x10 3/uL   Comprehensive metabolic panel   Result Value Ref Range    Glucose 98 70 - 100 mg/dL    BUN 6 (L) 7 - 19 mg/dL    Creatinine 0.8 0.6 - 1.0 mg/dL    Sodium 119 147 - 829 mEq/L    Potassium 3.7 3.5 - 5.1 mEq/L    Chloride 106 100 - 111 mEq/L    CO2 25 22 - 29 mEq/L    Calcium 8.9 8.5 - 10.5 mg/dL    Protein, Total 6.1 6.0 - 8.3 g/dL    Albumin 3.4 (L) 3.5 - 5.0 g/dL    AST (SGOT) 12 5 - 34 U/L    ALT 11 0 - 55 U/L    Alkaline Phosphatase 78 37 - 106 U/L    Bilirubin, Total 0.2 0.2 - 1.2 mg/dL    Globulin 2.7 2.0 - 3.6 g/dL    Albumin/Globulin Ratio 1.3 0.9 - 2.2    Anion Gap 12.0 5.0 - 15.0   Troponin I   Result Value Ref Range    Troponin I <0.01 0.00 - 0.05 ng/mL   B-type Natriuretic Peptide   Result Value Ref Range  B-Natriuretic Peptide <10 0 - 100 pg/mL   PT/APTT   Result Value Ref Range    PT 14.1 12.6 - 15.0 sec    PT INR 1.1 0.9 - 1.1    PTT 35 23 - 37 sec   UA, Reflex to Microscopic   Result Value Ref Range    Urine Type Clean  Catch     Color, UA Straw Colorless - Yellow    Clarity, UA Clear Clear - Hazy    Specific Gravity UA 1.008 1.001 - 1.035    Urine pH 6.0 5.0 - 8.0    Leukocyte Esterase, UA Negative Negative    Nitrite, UA Negative Negative    Protein, UR Negative Negative    Glucose, UA Negative Negative    Ketones UA Negative Negative    Urobilinogen, UA Negative 0.2 - 2.0 mg/dL    Bilirubin, UA Negative Negative    Blood, UA Small (A) Negative    RBC, UA 0 - 2 0 - 5 /hpf    WBC, UA 0 - 5 0 - 5 /hpf    Squamous Epithelial Cells, Urine 0 - 5 0 - 25 /hpf   GFR   Result Value Ref Range    EGFR >60.0    Hemoglobin A1C   Result Value Ref Range    Hemoglobin A1C 5.7 4.6 - 5.9 %    Average Estimated Glucose 116.9 mg/dL   CBC without differential   Result Value Ref Range    WBC 7.51 3.10 - 9.50 x10 3/uL    Hgb 10.6 (L) 11.4 - 14.8 g/dL    Hematocrit 16.1 (L) 34.7 - 43.7 %    Platelets 279 142 - 346 x10 3/uL    RBC 3.80 (L) 3.90 - 5.10 x10 6/uL    MCV 86.3 78.0 - 96.0 fL    MCH 27.9 25.1 - 33.5 pg    MCHC 32.3 31.5 - 35.8 g/dL    RDW 13 11 - 15 %    MPV 9.9 8.9 - 12.5 fL    Nucleated RBC 0.3 (H) 0.0 - 0.0 /100 WBC    Absolute NRBC 0.02 (H) 0.00 - 0.00 x10 3/uL   Basic Metabolic Panel   Result Value Ref Range    Glucose 148 (H) 70 - 100 mg/dL    BUN 5 (L) 7 - 19 mg/dL    Creatinine 0.8 0.6 - 1.0 mg/dL    Calcium 8.4 (L) 8.5 - 10.5 mg/dL    Sodium 096 045 - 409 mEq/L    Potassium 4.3 3.5 - 5.1 mEq/L    Chloride 106 100 - 111 mEq/L    CO2 20 (L) 22 - 29 mEq/L    Anion Gap 11.0 5.0 - 15.0   TSH   Result Value Ref Range    TSH 0.34 (L) 0.35 - 4.94 uIU/mL   Vitamin B12   Result Value Ref Range    Vitamin B-12 494 211 - 911 pg/mL   Hemolysis index   Result Value Ref Range    Hemolysis Index 68 (H) 0 - 18   GFR   Result Value Ref Range    EGFR >60.0    Glucose Whole Blood - POCT   Result Value Ref Range    Whole Blood Glucose POCT 92 70 - 100 mg/dL   Glucose Whole Blood - POCT   Result Value Ref Range    Whole Blood Glucose POCT 90 70 - 100 mg/dL    Glucose Whole Blood -  POCT   Result Value Ref Range    Whole Blood Glucose POCT 161 (H) 70 - 100 mg/dL   Glucose Whole Blood - POCT   Result Value Ref Range    Whole Blood Glucose POCT 147 (H) 70 - 100 mg/dL   Glucose Whole Blood - POCT   Result Value Ref Range    Whole Blood Glucose POCT 119 (H) 70 - 100 mg/dL   ECG 12 Lead   Result Value Ref Range    Ventricular Rate 88 BPM    Atrial Rate 88 BPM    P-R Interval 158 ms    QRS Duration 90 ms    Q-T Interval 368 ms    QTC Calculation (Bezet) 445 ms    P Axis 29 degrees    R Axis 14 degrees    T Axis 260 degrees       CRITICAL CARE/PROCEDURES    Procedures    DIAGNOSIS      Diagnosis:  Final diagnoses:   Left-sided weakness   Chest pain, unspecified type   Migraine without aura and without status migrainosus, not intractable       Disposition:  ED Disposition     ED Disposition Condition Date/Time Comment    Observation  Sun Jun 16, 2019  4:01 PM Admitting Physician: Mahlon Gammon, MAJID [[id number]]   Diagnosis: Left-sided weakness [119147]   Estimated Length of Stay: < 2 midnights   Tentative Discharge Plan?: Home or Self Care [1]   [name] Class: Observation [104]            Prescriptions:  Discharge Medication List as of 06/17/2019  4:20 PM      START taking these medications    Details   terazosin (HYTRIN) 2 MG capsule Take 1 capsule (2 mg total) by mouth nightly, Starting Mon 06/17/2019, Normal         CONTINUE these medications which have NOT CHANGED    Details   acetaminophen (TYLENOL) 325 MG tablet Take 2 tablets (650 mg total) by mouth every 4 (four) hours as needed for Pain, Starting Wed 08/29/2018, Print      albuterol (PROVENTIL HFA) 108 (90 Base) MCG/ACT inhaler Inhale 1 puff into the lungs daily Also take it PRN every 20 minutes., Historical Med      amLODIPine (NORVASC) 2.5 MG tablet Take 1 tablet (2.5 mg total) by mouth daily, Starting Thu 01/10/2019, Print      apixaban (ELIQUIS) 5 MG Take 1 tablet (5 mg total) by mouth every 12 (twelve) hours, Starting  Wed 01/09/2019, Print      atorvastatin (LIPITOR) 40 MG tablet Take 40 mg by mouth daily, Historical Med      budesonide-formoterol (SYMBICORT) 80-4.5 MCG/ACT inhaler Inhale 2 puffs into the lungs daily, Historical Med      furosemide (LASIX) 20 MG tablet Take 1 tablet (20 mg total) by mouth daily, Starting Thu 08/30/2018, Print      lactobacillus/streptococcus (RISAQUAD) Cap Take 1 capsule by mouth daily, Starting Wed 01/09/2019, Print      lamoTRIgine (LAMICTAL) 150 MG tablet Take 1 tablet (150 mg total) by mouth 2 (two) times daily, Starting Wed 08/29/2018, Print      levETIRAcetam (KEPPRA) 500 MG tablet Take 500 mg by mouth 2 (two) times daily, Historical Med      LORazepam (Ativan) 0.5 MG tablet Take 0.5 mg by mouth daily as needed for Anxiety, Historical Med      Lurasidone HCl (Latuda) 40 MG Tab Take 40  mg by mouth nightly, Historical Med      metoprolol succinate XL (TOPROL-XL) 25 MG 24 hr tablet Take 25 mg by mouth daily, Historical Med      pantoprazole (PROTONIX) 40 MG tablet Take 1 tablet (40 mg total) by mouth daily, Starting Wed 08/29/2018, Print      psyllium (METAMUCIL) 58.12 % Pack packet Take 1 packet by mouth daily, Starting Mon 05/13/2019, Print      QUEtiapine (SEROQUEL) 50 MG tablet Take 1 tablet (50 mg total) by mouth nightly, Starting Wed 08/29/2018, Print      rOPINIRole (REQUIP) 1 MG tablet Take 1 tablet (1 mg total) by mouth nightly, Starting Wed 08/29/2018, Print             This note was generated by the Epic EMR system/ Dragon speech recognition and may contain inherent errors or omissions not intended by the user. Grammatical errors, random word insertions, deletions and pronoun errors  are occasional consequences of this technology due to software limitations. Not all errors are caught or corrected. If there are questions or concerns about the content of this note or information contained within the body of this dictation they should be addressed directly with the author for clarification.      Daune Perch, FNP  06/17/19 1751

## 2019-06-16 NOTE — ED Notes (Signed)
Bed: E07  Expected date:   Expected time:   Means of arrival:   Comments:

## 2019-06-16 NOTE — ED Notes (Signed)
Pt will need Ultrasound IV.

## 2019-06-16 NOTE — EDIE (Signed)
COLLECTIVE?NOTIFICATION?06/16/2019 13:48?[name], [name]?MRN: [id number]    [facility]'s patient encounter information:   GEX:?[id number]  Account [id number]  Billing Account [id number]      Criteria Met      3 Different Facilities in 90 Days    5 ED Visits in 12 Months    Security and Safety  No recent Security Events currently on file    ED Care Guidelines  There are currently no ED Care Guidelines for this patient. Please check your facility's medical records system.    Flags      Negative COVID-19 Lab Result - [facility] - A specimen collected from this patient was negative for COVID-19 / Attributed By: [place] Department of Health / Attributed On: 05/06/2019       Prescription Monitoring Program  491??- Narcotic Use Score  421??- Sedative Use Score  000??- Stimulant Use Score  260??- Overdose Risk Score  - All Scores range from 000-999 with 75% of the population scoring < 200 and on 1% scoring above 650  - The last digit of the narcotic, sedative, and stimulant score indicates the number of active prescriptions of that type  - Higher Use scores correlate with increased prescribers, pharmacies, mg equiv, and overlapping prescriptions  - Higher Overdose Risk Scores correlate with increased risk of unintentional overdose death   Concerning or unexpectedly high scores should prompt a review of the PMP record; this does not constitute checking PMP for prescribing purposes.      E.D. Visit Count (12 mo.)  Facility Visits   [facility] [address] [facility] 3   [facility] [place] 6   [facility] [place] 1   [facility] 1   [facility] 2   Total 59   Note: Visits indicate total known visits.      Recent Emergency Department Visit Summary  Showing 10 most recent visits out of 59 in the past 12 months  Date Facility [facility] Type Diagnoses or Chief Complaint   Jun 16, 2019 [place] [facility]  [place] [name] [place] [facility]      chest pain      Jun 15, 2019 [facility] [place] [facility] [place] [facility]      FALLING HEADACHE VOMITING NO TRAVEL      FALL      Unspecified injury of head, initial encounter      Personal history of transient ischemic attack (TIA), and cere      Other migraine, not intractable, without status migrainosus      Personal history of other diseases of the circulatory system      Strain of muscle, fascia and tendon at neck level, initial en      Jun 05, 2019 [facility] [place] [facility] [place] [facility]      LEFT SIDE DROOP, HEADACHE,      FACIAL DROOP      Weakness      Jun 01, 2019 [facility] [place] [name] [place] [facility]      CHEST PAIN SYNCOPE NO TRAVEL      CHEST PAIN ADULT DIZZINESS      2. Dizziness and giddiness      3. Chest pain, unspecified      4. Personal history of transient ischemic attack (TIA), and cere      5. Essential (primary) hypertension      6.  Bipolar disorder, unspecified      7. Pruritus, unspecified      9. Nausea      10. Unspecified abdominal pain      May 30, 2019 [facility] [name] [place] [facility]      HEAD PAIN, VOMITING, BODY ACHES      HEADACHE NEW ONSET OR NEW SYMPTOMS DIZZINESS      HEADACHE NEW ONSET OR NEW SYMPTOMS DI      1. Headache      2. Migraine without aura, not intractable, without status migrai      3. Bipolar disorder, unspecified      4. Chronic obstructive pulmonary disease, unspecified      5. Personal history of transient ischemic attack (TIA), and cere      6. Essential (primary) hypertension      7. Paroxysmal atrial fibrillation      May 29, 2019 [facility] [place] [name] [place] [facility]      NAUSEA/TROUBLE WALKING      HEADACHE NEW ONSET OR NEW SYMPTOMS      1. Headache      3. Other chronic pain      4. Bipolar disorder, unspecified      5. Chronic obstructive pulmonary disease, unspecified      6. Essential (primary) hypertension      7. Personal history of transient ischemic attack (TIA), and  cere      8. Type 2 diabetes mellitus without complications      9. Family history of ischemic heart disease and other diseases o      May 20, 2019 [place] [name] [place] [facility]      stroke symptomn      Facial Droop      Weakness      May 11, 2019 [place] [facility] [place] [facility]      CP      Chest Pain      Chest pain, unspecified      Other forms of angina pectoris      May 05, 2019 [facility] [place] [name] [place] [facility]      DEMENTIA SYMPTOMS      ALTERED MENTAL STATUS      Weakness      Apr 28, 2019 [facility] [place] [name] [place] [facility]      ASTHMA ATTACK      ASTHMA      Unspecified asthma with (acute) exacerbation      1. Cough      2. Shortness of breath      5. Chest pain, unspecified      6. Essential (primary) hypertension      7. Paroxysmal atrial fibrillation      8. Type 2 diabetes mellitus without complications      9. Personal history of transient ischemic attack (TIA), and cere          Recent Inpatient Visit Summary  Date Facility [facility] Type Diagnoses or Chief Complaint   Jun 05, 2019 [place] [facility] [name] [place] [facility]      FACIAL DROOP      Weakness      Feb 14, 2019 [facility] [name]      LEFT SIDEED WEAKNESS      LEFT SIDEED WEAKNESS TIA TRANSIENT ISCHEMIC ATTACK      1. Weakness  2. Transient cerebral ischemic attack, unspecified      3. Migraine with aura, not intractable, without status migrainos      4. Chronic obstructive pulmonary disease, unspecified      5. Paroxysmal atrial fibrillation      6. Type 2 diabetes mellitus without complications      7. Bipolar disorder, unspecified      8. Essential (primary) hypertension      Jan 05, 2019 [place] [facility] [place] [name]      Acute sialoadenitis      Chest pain, unspecified      Torticollis      Dec 18, 2018 [facility] [name] [place] [facility]      RECTAL  BLEEDING      Anemia, unspecified      Melena      1. Hemorrhage of anus and rectum      2. Diverticulosis of large intestine without perforation or absc      3. Acute posthemorrhagic anemia      4. Epilepsy, unspecified, not intractable, without status epilep      5. Paroxysmal atrial fibrillation      6. Bipolar disorder, unspecified      7. Hyperlipidemia, unspecified      Nov 21, 2018 [facility] [name] [place] [facility]      ACUTE CHEST PAIN      1. Chest pain, unspecified      2. Other chest pain      3. Chronic diastolic (congestive) heart failure      4. Chronic obstructive pulmonary disease, unspecified      5. Hypertensive heart disease with heart failure      6. Bipolar disorder, unspecified      7. Type 2 diabetes mellitus without complications      8. 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 [name] [place] [facility]      WEAKNESS OF LEFT UPPER EXTREMITY      Other symptoms and signs involving the musculoskeletal system      Slurred speech      WEAKNESS OF LEFT UPPER EXTREMITY PROGRESSIVE FOCAL MOTO      Jul 08, 2018 [facility] [name] [place] [facility]      SYNCOPE      Dizziness and giddiness      SYNCOPE SLURRED SPEECH      3. Paroxysmal atrial fibrillation      4. Slurred speech      4. Syncope and collapse      5. Essential (primary) hypertension      6. Gastro-esophageal reflux disease without esophagitis      7. Type 2 diabetes mellitus without complications      8. Anxiety disorder, unspecified      Jun 20, 2018 [facility] [place] [name]  [facility]      CHEST PAIN      Chest pain, unspecified      CHEST PAIN CHEST PAIN          Care Team  There is not a care team on record at this time.   Collective Portal  This patient has registered at the [facility] Department   For more  information visit: [email or web address]  PLEASE NOTE:     1.   Any care recommendations and other clinical information are provided as guidelines or for historical purposes only, and providers should exercise their own clinical judgment when providing care.    2.   You may only use this information for purposes of treatment, payment or health care operations activities, and subject to the limitations of applicable [employer] Policies.    3.   You should consult directly with the organization that provided a care guideline or other clinical history with any questions about additional information or accuracy or completeness of information provided.    ? 2020 [employer] - [email or web address]

## 2019-06-16 NOTE — Plan of Care (Signed)
Problem: Safety  Goal: Patient will be free from injury during hospitalization  Outcome: Progressing  Flowsheets (Taken 06/16/2019 2259)  Patient will be free from injury during hospitalization:   Assess patient's risk for falls and implement fall prevention plan of care per policy   Provide and maintain safe environment   Use appropriate transfer methods   Ensure appropriate safety devices are available at the bedside   Include patient/ family/ care giver in decisions related to safety   Hourly rounding   Assess for patients risk for elopement and implement Elopement Risk Plan per policy   Provide alternative method of communication if needed (communication boards, writing)     Problem: Day of Admission - Stroke  Goal: Core/Quality measure requirements - Admission  Outcome: Progressing  Flowsheets (Taken 06/16/2019 2259)  Core/Quality measure requirements - Admission:   Document NIH Stroke Scale on admission   Document nursing swallow/dysphagia screen on admission. If patient fails, keep patient NPO (follow your hospital protocol on swallowing screening).   VTE Prevention: Ensure anticoagulant(s) administered and/or anti-embolism stockings/devices documented as ordered   Ensure lipid panel ordered   Begin stroke education on admission (must include Modifiable Risk Factors, Warning Signs and Symptoms of Stroke, Activation of Emergency Medical System and Follow-up Appointments) Ensure handout has been given and documented.   Ensure PT/OT and/or SLP ordered   If diagnosis or history of Atrial Fib/Atrial Flutter, ensure oral anticoagulation is initiated or contraindication documented by LIP   Ensure antithrombotic administered or contraindication documented by LIP     Patient admitted to the unit for Left sided weakness. Alert and oriented x4. Forgetful at times. Vital signs stable. Sinus rhythm on tele monitor. Patient complained of headache at a scale of 10/10 upon assessment. MD notified and ordered meds given. NIH  stroke scale done. Patient failed dysphagia diet, mild right side facial droop noticed. Patient is currently placed on NPO status for speech to evaluate. Neuro checks every 4hrs. Patient oriented to the unit, room and call bell. Admission history completed.    The learning abilities of the patient and/or caregiver have been assessed. Today's individualized plan of care includes neuro checks q4hrs, pain management, NPO for speech to evaluate swallowing in the morning. The patient states the following personal goal related to the patient's deficit(s): "By discharge my right sided weakness should resolve." The plan of care was discussed with the patient, who agrees to it and demonstrates understanding of the disease process, risk factors, treatment plan, medications and consequences of noncompliance. All questions and concerns were addressed.

## 2019-06-17 DIAGNOSIS — F411 Generalized anxiety disorder: Secondary | ICD-10-CM

## 2019-06-17 DIAGNOSIS — F4311 Post-traumatic stress disorder, acute: Secondary | ICD-10-CM

## 2019-06-17 LAB — CBC
Absolute NRBC: 0.02 10*3/uL — ABNORMAL HIGH (ref 0.00–0.00)
Hematocrit: 32.8 % — ABNORMAL LOW (ref 34.7–43.7)
Hgb: 10.6 g/dL — ABNORMAL LOW (ref 11.4–14.8)
MCH: 27.9 pg (ref 25.1–33.5)
MCHC: 32.3 g/dL (ref 31.5–35.8)
MCV: 86.3 fL (ref 78.0–96.0)
MPV: 9.9 fL (ref 8.9–12.5)
Nucleated RBC: 0.3 /100 WBC — ABNORMAL HIGH (ref 0.0–0.0)
Platelets: 279 10*3/uL (ref 142–346)
RBC: 3.8 10*6/uL — ABNORMAL LOW (ref 3.90–5.10)
RDW: 13 % (ref 11–15)
WBC: 7.51 10*3/uL (ref 3.10–9.50)

## 2019-06-17 LAB — BASIC METABOLIC PANEL
Anion Gap: 11 (ref 5.0–15.0)
BUN: 5 mg/dL — ABNORMAL LOW (ref 7–19)
CO2: 20 mEq/L — ABNORMAL LOW (ref 22–29)
Calcium: 8.4 mg/dL — ABNORMAL LOW (ref 8.5–10.5)
Chloride: 106 mEq/L (ref 100–111)
Creatinine: 0.8 mg/dL (ref 0.6–1.0)
Glucose: 148 mg/dL — ABNORMAL HIGH (ref 70–100)
Potassium: 4.3 mEq/L (ref 3.5–5.1)
Sodium: 137 mEq/L (ref 136–145)

## 2019-06-17 LAB — HEMOGLOBIN A1C
Average Estimated Glucose: 116.9 mg/dL
Hemoglobin A1C: 5.7 % (ref 4.6–5.9)

## 2019-06-17 LAB — ECG 12-LEAD
Atrial Rate: 88 {beats}/min
P Axis: 29 degrees
P-R Interval: 158 ms
Q-T Interval: 368 ms
QRS Duration: 90 ms
QTC Calculation (Bezet): 445 ms
R Axis: 14 degrees
T Axis: 260 degrees
Ventricular Rate: 88 {beats}/min

## 2019-06-17 LAB — GLUCOSE WHOLE BLOOD - POCT
Whole Blood Glucose POCT: 161 mg/dL — ABNORMAL HIGH (ref 70–100)
Whole Blood Glucose POCT: 147 mg/dL — ABNORMAL HIGH (ref 70–100)
Whole Blood Glucose POCT: 119 mg/dL — ABNORMAL HIGH (ref 70–100)

## 2019-06-17 LAB — VITAMIN B12: Vitamin B-12: 494 pg/mL (ref 211–911)

## 2019-06-17 LAB — HEMOLYSIS INDEX: Hemolysis Index: 68 — ABNORMAL HIGH (ref 0–18)

## 2019-06-17 LAB — GFR: EGFR: >60

## 2019-06-17 LAB — TSH: TSH: 0.34 u[IU]/mL — ABNORMAL LOW (ref 0.35–4.94)

## 2019-06-17 MED ORDER — DIPHENHYDRAMINE HCL 25 MG PO CAPS
25.00 mg | ORAL_CAPSULE | Freq: Once | ORAL | Status: AC
Start: 2019-06-17 — End: 2019-06-17
  Administered 2019-06-17: 25 mg via ORAL
  Filled 2019-06-17: qty 1

## 2019-06-17 MED ORDER — DEXAMETHASONE SODIUM PHOSPHATE 4 MG/ML IJ SOLN (WRAP)
4.00 mg | Freq: Once | INTRAMUSCULAR | Status: AC
Start: 2019-06-17 — End: 2019-06-17
  Administered 2019-06-17: 4 mg via INTRAVENOUS
  Filled 2019-06-17: qty 5

## 2019-06-17 MED ORDER — BUTALBITAL-APAP-CAFFEINE 50-325-40 MG PO TABS
1.00 | ORAL_TABLET | Freq: Once | ORAL | Status: AC
Start: 2019-06-17 — End: 2019-06-17
  Administered 2019-06-17: 1 via ORAL
  Filled 2019-06-17: qty 1

## 2019-06-17 MED ORDER — TERAZOSIN HCL 1 MG PO CAPS
2.00 mg | ORAL_CAPSULE | Freq: Every evening | ORAL | Status: DC
Start: 2019-06-17 — End: 2019-06-17

## 2019-06-17 MED ORDER — TERAZOSIN HCL 2 MG PO CAPS
2.00 mg | ORAL_CAPSULE | Freq: Every evening | ORAL | 1 refills | Status: DC
Start: 2019-06-17 — End: 2022-01-26

## 2019-06-17 MED ORDER — METOCLOPRAMIDE HCL 5 MG/ML IJ SOLN
5.00 mg | Freq: Once | INTRAMUSCULAR | Status: AC
Start: 2019-06-17 — End: 2019-06-17
  Administered 2019-06-17: 5 mg via INTRAVENOUS
  Filled 2019-06-17: qty 2

## 2019-06-17 NOTE — Progress Notes (Signed)
Patient discharged home today. Alert and oriented. Not in distress. D/c instructions on meds and follow up appt given to pt.Pt verbalized understanding. IV and tele. Pt denied any pain prior to discharge.Pt left unit accompanied by family and staff via wheelchair. Pt left with all her belongings.

## 2019-06-17 NOTE — SLP Eval Note (Signed)
[name]  [address]  [place], [place] [address]  [phone]    SPEECH LANGUAGE COGNITIVE SWALLOW EVALUATION    [name]    MRN#: [id number]    Date/Time of Evaluation:   Time Calculation  SLP Received On: 06/17/19  Start Time: 0730  Stop Time: 0754  Time Calculation (min): 24 min  Total Treatment Time (min): 20    Consult received for [name] for SLP Evaluation and Treatment.    Referring Physician: Dr. Marquis [name]    Date of Referral: 06/17/2019    Interpreter utilized: no, not indicated    Therapist PPE during session procedural mask and gloves     Assessment and Clinical Impression   [name] is a 63 y.o. female admitted 06/16/2019 for  Left-sided weakness [R53.1]  Migraine without aura and without status migrainosus, not intractable [G43.009]  Chest pain, unspecified type [R07.9] presenting with speech, language, cognitive linguistic skills, and swallow grossly WFL during this exam. Consult received per stroke pathway; MRI negative for acute infarction. Presentation is suggestive of non-physiological etiology. Pt reports LUE and LLE weakness, as well as left facial droop with fall upon onset of symptoms. She reports transient episode of significant confusion c/b "starting a sentence in the middle of the sentence and not realizing [her] daughter didn't know what [she] was talking about." as well as transient episode of expressive language impairment c/b neologistic jargon output. Resolved at this time. Oral motor exam reveals labial asymmetry L side at rest, although deficit appears non-physiological as L perioral area appears tensed and partially retracted, smile is symmetrical, there is significant deviation to R side, and pt reports that she is experiencing anterior spillage from R side. No s/s aspiration appreciated with dry solids or thin liquids via straw. Vocal quality clear. Complete oral clearance noted; pt reports no globus sensation. Called back to room after  evaluation as pt reported to RN that the cracker felt very dry and "became thicker" as she swallowed it.  Discussed various option, including modified barium swallow study (no additional time slots today) if pt is concerned about swallowing, modified diet such as puree or mech alt (facilitated education that altered diets may be less nutrient-dense), or self-selection of regular texture items that are easier for pt to manage. Pt endorses preference for regular diet and thin liquids with self-selection of preferred items. RN reports pt consumed breakfast with no s/s aspiration or c/o difficulty. Given neuro workup negative, no further SLP intervention indicated.       Indication for instrumental assessment of swallow: n/a  Plan   Goals of Care: treatment/curative pathway  Plan / Recommendations  Plan: begin/continue oral diet, no SLP f/u necessary  SLP Frequency Recommended: one time visit  Diet Solids Recommendation: regular  Diet Liquids Recommendations: thin consistency  Precautions/Compensations: Awake/alert, Upright 90 degrees for all oral intake  Recommended Form of Meds: PO, whole  Suggestions for Feeding: independent  Recommendation Discussed With: : Patient, Physician, Nurse      Goals: n/a  Rehabilitation Prognosis: n/a n/a      History     Prior Speech Therapy Intervention: stroke evaluation May 21, 2019 unremarkable except for facial droop that appeared non-physiological. MRI unremarkable at that time.     Medical Diagnosis: Left-sided weakness [R53.1]  Migraine without aura and without status migrainosus, not intractable [G43.009]  Chest pain, unspecified type [R07.9]    History of Present Illness: [name] is a 63 y.o. female admitted on 06/16/2019 with  Patient Active Problem List   Diagnosis   . CVA (cerebral vascular accident)   . Chest pain   . Chest pain with high risk of acute coronary syndrome   . Paroxysmal atrial fibrillation   . Hypertension   . Hyperlipidemia   . Seizure disorder   .  History of gastrointestinal hemorrhage   . Depression   . Anemia   . Asthma   . Non-traumatic subcutaneous emphysema   . Chest pain with moderate risk for cardiac etiology   . Left-sided weakness   . History of asthma   . History of transient ischemic attack (TIA)   . Type 2 diabetes mellitus   . Syncope and collapse        Past Medical/Surgical History  Past Medical History:   Diagnosis Date   . Anemia    . Asthma    . Atrial fibrillation    . Chronic obstructive pulmonary disease    . Convulsions    . Depression    . Diabetes mellitus     diet controlled    . Hyperlipidemia    . Hypertension    . TIA (transient ischemic attack)       Past Surgical History:   Procedure Laterality Date   . CARDIAC CATHETERIZATION  2017   . HYSTERECTOMY     . pci               Subjective   Patient is agreeable to participation in the therapy session. Patient's medical condition is  appropriate for speech therapy intervention at this time.  Patient's goal:  "I know I had a stroke."   Pain: Pt unable to quantify; RN aware of significant HA. RN and unit supervisor have met with patient to discuss pain medications.       Social History: Patient resides with her daughter and is generally independent with functional tasks.      Objective     Respiratory Status  Current Status  Respiratory Status: within normal limits      Oral Motor Skills  Labial ROM: Within Functional Limits  Labial Symmetry: Abnormal symmetry left     Lingual ROM: Reduced left  Lingual Symmetry: Abnormal symmetry right     Velum: Within Functional Limits  Mandible: Within Functional Limits  Facial ROM: Within Functional Limits  Facial Symmetry: Within Functional Limits  Facial Sensation: Within Functional Limits  Vocal Quality: Within Functional Limits     Vocal Intensity: Within Functional Limits  Gag: Within Functional Limits  Apraxia: None present     Breath Support: Adequate for speech  Dentition: Adequate  Hearing: Within Functional Limits       Deglutition  Skills  Position: upright 90 degrees  Food(s) Tested: thin liquid;solid  Oral Stage: adequate  Pharyngeal Stage: adequate  Esophageal Stage: appears WFL    Language Comprehension  Auditory Comprehension: Within Functional Limits           Following Commands: Follows one step commands without difficulty    Language Expression  Single Words: WFL  Connected Speech: WFL             Cognitive Linguistic Skills  Arousal/Alertness: Appropriate responses to stimuli  Attention Span: Appears intact  Orientation Level: Oriented X4  Memory: Appears intact        Problem Solving: Able to problem solve independently          Formal Testing  n/a        Signature  Gaylan Gerold, M.Ed  CCC-SLP  06/17/2019  Phone: [phone]  [phone] for Rehab Tech/scheduling questions)

## 2019-06-17 NOTE — Consults (Signed)
NEUROLOGY CONSULTATION    Date Time: 06/17/19 11:54 AM  Patient Name: [name],[name]  Attending Physician: Aretha Parrot, MD      Assessment & Plan:   Intermittent speech disturbance difficulty with walking, left-sided facial droop, unclear etiology, no clear-cut CVA noted on MRI parts of her exam seems nonphysiological  History of A. fib  History of seizures---Petit Mal   Diabetes  Hyperlipidemia   Given MRI is negative for stroke, no other recommendations, may get a repeat EEG as outpatient and follow-up with her outpatient treating neurologist to adjust seizure medicines dosages--> in case these are  seizure-like events.   Informed nurse to obtain records from outpatient treating neurologist   Please call if new questions,    History of Present Illness:   Patient is a 63 year old lady, admitted to the hospital because of new onset of speech disturbance.  She says 2 days ago she woke up in the morning, and felt extremely lightheaded, she somehow slid out of bed, she was dizzy she was stumbling around she was nauseous she called her daughter for help who found that her face was twisted to one side, and she had significantly altered speech in addition to this particular symptom she had also intermittently has been having problems with following conversations, and intermittently not being able to talk properly where everything seems to come out garbled    She has a prior history of seizures, which she describes as petit mal seizures, she does say that she has bitten her tongue, and also has been incontinent for the seizures    Testing so far has included brain MRI which shows no acute strokeMRA neck shows no vascular occlusion MRA head no significant stenosis Hemoglobin A1c 5.7 TSH 0.3 folate on August 21 was 6.4  She was also admitted in July with  negative evaluation and work-up  Past Medical History:     Past Medical History:   Diagnosis Date   . Anemia    . Asthma    . Atrial fibrillation    . Chronic  obstructive pulmonary disease    . Convulsions    . Depression    . Diabetes mellitus     diet controlled    . Hyperlipidemia    . Hypertension    . TIA (transient ischemic attack)        Meds:   Albuterol Norvasc Eliquis aspirin Lipitor Symbicort Fioricet Lasix insulinKeppra 500 twice a day Lamictal 150 twice daily    Allergies   Allergen Reactions   . Singulair [Montelukast] Itching   . Myoview [Technetium-12m] Itching   . Contrast [Iodinated Diagnostic Agents]    . Latex    . Nitroglycerin    . Penicillins    . Tetracyclines & Related        Social & Family History:     Social History     Socioeconomic History   . Marital status: Divorced     Spouse name: Not on file   . Number of children: Not on file   . Years of education: Not on file   . Highest education level: Not on file   Occupational History   . Not on file   Social Needs   . Financial resource strain: Not on file   . Food insecurity     Worry: Not on file     Inability: Not on file   . Transportation needs     Medical: Not on file     Non-medical:  Not on file   Tobacco Use   . Smoking status: Never Smoker   . Smokeless tobacco: Never Used   Substance and Sexual Activity   . Alcohol use: Never     Frequency: Never   . Drug use: Never   . Sexual activity: Not on file   Lifestyle   . Physical activity     Days per week: Not on file     Minutes per session: Not on file   . Stress: Not on file   Relationships   . Social Wellsite geologist on phone: Not on file     Gets together: Not on file     Attends religious service: Not on file     Active member of club or organization: Not on file     Attends meetings of clubs or organizations: Not on file     Relationship status: Not on file   . Intimate partner violence     Fear of current or ex partner: Not on file     Emotionally abused: Not on file     Physically abused: Not on file     Forced sexual activity: Not on file   Other Topics Concern   . Not on file   Social History Narrative   . Not on file      History reviewed. No pertinent family history.        CODE STATUS: full     Review of Systems:   No headache, eye, ear nose, throat problems; no coughing or wheezing or shortness of breath, No chest pain or orthopnea, no abdominal pain, nausea or vomiting, No pain in the body or extremities, no psychiatric, neurological, endocrine, hematological or cardiac complaints except as noted above.       Physical Exam:   Blood pressure 124/69, pulse (!) 104, temperature 98.1 F (36.7 C), temperature source Oral, resp. rate 18, height 1.651 m (5\' 5" ), weight 99.3 kg (219 lb), SpO2 97 %.    HEENT: Normocephalic.no carotid bruits  Lungs:  CTA bil  Abd Soft   Cardiac:  S1,S2, normal rate and rhythm  Neck: supple, no cartoid bruits  Extremities: no edema  Skin: no rashes seen in exposed areas     Neuro:  Level of consciousness:  Alert and appropriate  Oriented:  X Athena Masse, , she thinks its  June, 25th could not tell me the year   cognition:  Intact naming, recognition,  and following commands  Cranial Nerves:  II-XII intact pupils are equal reactive eye movements are intact left-sided facial asymmetry noted tongue deviating to the right(non physiological)  Strength: Mild drift in the left upper extremity, mild weakness in the left leg, reflexes symmetric though diminished throughout light touch temperature reduced in the left face arm and the left    Labs:     Recent Labs   Lab 06/17/19  0436 06/16/19  1501   Glucose 148* 98   BUN 5* 6*   Creatinine 0.8 0.8   Calcium 8.4* 8.9   Sodium 137 143   Potassium 4.3 3.7   Chloride 106 106   CO2 20* 25   Albumin  --  3.4*   AST (SGOT)  --  12   ALT  --  11   Bilirubin, Total  --  0.2   Alkaline Phosphatase  --  78     Recent Labs   Lab 06/17/19  0519 06/16/19  1501   WBC  7.51 7.04   Hgb 10.6* 10.3*   Hematocrit 32.8* 31.5*   MCV 86.3 86.1   MCH 27.9 28.1   MCHC 32.3 32.7   Platelets 279 260         Recent Labs     06/16/19  1501   PTT 35   PT 14.1   PT INR 1.1           Radiology Results (24 Hour)     Procedure Component Value Units Date/Time    MR Angiogram Neck W WO Contrast [[id number]] Collected:  06/16/19 1852    Order Status:  Completed Updated:  06/16/19 1858    Narrative:       INDICATION: Neuro deficit, acute, stroke suspected    COMPARISON: None.    TECHNIQUE: Infusion MRA of the neck vessels following administration of  10 mL Gadavist contrast.    FINDINGS:    MRA: The exam is limited by artifact related to motion.    Right common carotid artery: No evidence of high grade stenosis or  occlusion.   Right internal carotid artery: No evidence of high grade stenosis or  occlusion.     Left common carotid artery: No evidence of high grade stenosis or  occlusion.   Left internal carotid artery: No evidence of high grade stenosis or  occlusion.     Basilar artery: Limited evaluation No evidence of occlusion    Right vertebral artery: Limited evaluation. The V4 and distal V3  segments are not well evaluated. No evidence of occlusion.   Left vertebral artery: Limited evaluation. The V4 and distal V3 segments  are not well evaluated. No evidence of occlusion.       Impression:        Motion limited exam. No evidence of vascular occlusion.    END OF IMPRESSION       Gustavus Messing, MD   06/16/2019 6:56 PM    MRI Brain W WO Contrast [[id number]] Collected:  06/16/19 1848    Order Status:  Completed Updated:  06/16/19 1854    Narrative:       INDICATION: Neuro deficit, acute, stroke suspected. Left-sided weakness.  Left-sided facial droop.    TECHNIQUE: Multiplanar, multisequence MR imaging of the brain was  performed before and after 10 mL Gadavist IV contrast administration.     COMPARISON: 05/20/2019.    FINDINGS: Exam is limited by artifact related to patient motion.    BRAIN: Cavum septum pellucidum is noted. Scattered foci of T2  prolongation demonstrated within the periventricular, deep, and  subcortical white matter. The ventricles and sulci are normal in size  and configuration.  There is no acute infarct or intracerebral  hemorrhage. No extra-axial blood or fluid collection is present. No  intracranial mass is identified. There is no midline shift or edema. No  abnormal enhancement appreciated within the limitations of the exam. No  paranasal sinus air fluid levels demonstrated.        Impression:        Motion limited exam. No evidence of acute intracranial  abnormality.      Moderate diffuse white matter disease, nonspecific but most often  reflects chronic small vessel ischemic changes.    Gustavus Messing, MD   06/16/2019 6:52 PM    MR Angiogram Head WO Contrast [[id number]] Collected:  06/16/19 1845    Order Status:  Completed Updated:  06/16/19 1850    Narrative:       INDICATION: Neuro deficit, acute, stroke suspected  TECHNIQUE: Time of flight MRA of the circle of Willis was performed      FINDINGS:    MRA:    Right ACA: No evidence of high grade stenosis or occlusion.   Right MCA: No evidence of high grade stenosis or occlusion.   Right PCA: No evidence of high grade stenosis or occlusion.     Left ACA: No evidence of high grade stenosis or occlusion.   Left MCA: No evidence of high grade stenosis or occlusion.   Left PCA: No evidence of high grade stenosis or occlusion.    No aneurysm identified.      Impression:        No evidence of significant stenosis within the circle of  Willis.      END OF IMPRESSION       Gustavus Messing, MD   06/16/2019 6:48 PM    Chest AP Portable [[id number]] Collected:  06/16/19 1514    Order Status:  Completed Updated:  06/16/19 1517    Narrative:       HISTORY: Acute chest pain, shortness of breath.    COMPARISON: Prior study from July.    FINDINGS: Single AP portable radiograph of the chest was performed. No  airspace disease, pleural effusion, or pneumothorax is seen. No bony  abnormality is seen. The heart is not enlarged. There is tortuous aorta.    Trachea is judged unremarkable.      Impression:        No acute process seen.    Charlene Brooke, MD    06/16/2019 3:14 PM    CT Head WO Contrast [[id number]] Collected:  06/16/19 1434    Order Status:  Completed Updated:  06/16/19 1439    Narrative:       INDICATION: Acute left-sided weakness, paresthesias. Comparison is made  with prior MR of the brain from July 27 and prior CT scan of the brain  from the same day    TECHNIQUE: Images are obtained from the skull base to the vertex without  contrast administration. A combination of automated exposure control,  adjustment of the mA and/or kV according to patient size and/or use of  iterative reconstruction technique was utilized.        FINDINGS: There is no hydrocephalus. 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 No shift is seen. No acute bleed or fluid collection is  detected. The visualized sinuses are aerated. No atrophy is seen. No  mass is seen. No skull lesion is found.      Impression:        No acute process seen. Stable exam.    Charlene Brooke, MD   06/16/2019 2:37 PM           All recent brain and spine imaging (MRI, CT) personally reviewed.    Chart reviewed    Case discussed with: patient and RN       This note was generated by the Epic EMR system/Speech recognition and may contain inherent errors or omissions not intended by the user. Grammatical errors, random word insertions, deletions and pronoun errors  are occasional consequences of this technology due to software limitations.   Not all errors are caught or corrected. If there are questions or concerns about the content of this note or information contained within the body of this dictation they should be addressed directly with the author for clarification.    Signed by: Cathe Mons, MD  [place]: [id number]      Answering Service: [phone]

## 2019-06-17 NOTE — PT Eval Note (Signed)
[facility]  [address]  [place], [place] [address]  [phone]    Physical Therapy Evaluation    Patient: Ngina Royer MRN: [id number]   Unit: [facility] INTERMEDIATE CARE Bed: MI622/MI622-01    Time of Treatment: Time Calculation  PT Received On: 06/17/19  Start Time: 1230  Stop Time: 1250  Time Calculation (min): 20 min    Consult received for [name] for PT evaluation and treatment.  Patient's medical condition is appropriate for Physical Therapy  intervention at this time.    Interpreter utilized: no, not indicated      D/C Suggestions   Based on today's performance:  Recommendation  Discharge Recommendation: Home with supervision;Home with home health PT  DME Recommended for Discharge: Front wheel walker  PT Frequency: 2-3x/wk.      Transport Recommendations: no limitations    Discharge recommendations are based on patient's progression/regression. Please see most recent note for updated discharge recommendations.    Assessment   Laurice Iglesia is a 63 y.o. female admitted 06/16/2019.  Pt admitted 2/2  Chest pain and HA.  She lives with daughter and   4 grandchildren in a house.  Pt is never alone.  She is mod I  For ambulation with a cane, -although she admits furniture surfing and ind with all ADL's.  Dtr takes pt to MD appt's.     Today pt with no c/o pain.  She is ind from supine to standing at EOB. She amb 5 'with RW and c/o dizziness and was min a to amb 5' back to bed.    BP sitting=116/71 supine=123/74.  Pt would benefit from HHPT and supervision s/p d/c.      Pt's functional mobility is impacted by:  decreased activity tolerance, decreased balance, confusion, dizziness/vertigo, gait impairment, decreased insight, decreased safety awareness, orientation, transfers  and weight.  There are a few comorbidities or other factors that affect plan of care and require modification of task including: assistive device needed for mobility, has stairs to manage and h/o previous CVA.  Standardized tests  and exams incorporated into evaluation include AMPAC mobility, balance, cognition/orientation, coordination, ROM  and Strength.     Pt would continue to benefit from PT to address these deficits and increase functional independence.     Complexity Level Hx and Co  morbidites Examination Clinical Decision Making Clinical Presentation     Moderate   1-2 factors 3 or more   Several options Evolving, plan may alter     PMP - Progressive Mobility Protocol   PMP Activity: Step 4 - Dangle at Bedside  Distance Walked (ft) (Step 6,7): 10 Feet(RW)       Rehabilitation Potential:   Prognosis: Good    Interdisciplinary Communication: RN      Plan       Plan  Risks/Benefits/POC Discussed with Pt/Family: With patient  Treatment/Interventions: Gait training;Stair training;Neuromuscular re-education;Endurance training  PT Frequency: 2-3x/wk         Medical Diagnosis: Left-sided weakness [R53.1]  Migraine without aura and without status migrainosus, not intractable [G43.009]  Chest pain, unspecified type [R07.9]    History of Present Illness: Danah Reinecke is a 63 y.o. female admitted on  06/16/2019 with Chest Pain and Headache     63 y.o. female with h/o A. Fib, seizure, anemia, asthma, COPD,  DM, TIA/CVA, HLD, HTN, and depression who presents the ED for evaluation of chest pain, headache, and left sided weakness. Patient reports she woke up this morning with generalized  weakness causing her to fall and hit her head. Denies LOC. Patient reports 1 hour PTA she developed a headache, chest pain, left sided weakness, and difficulty finding words. Denies fever/chills, Patient reports previos TIA/CVA denies any residual deficits. Ambulated with a cane at baseline. Patient currently anticoagulated on Eliquis for A-Fib.       Patient Active Problem List   Diagnosis   . CVA (cerebral vascular accident)   . Chest pain   . Chest pain with high risk of acute coronary syndrome   . Paroxysmal atrial fibrillation   . Hypertension   . Hyperlipidemia    . Seizure disorder   . History of gastrointestinal hemorrhage   . Depression   . Anemia   . Asthma   . Non-traumatic subcutaneous emphysema   . Chest pain with moderate risk for cardiac etiology   . Left-sided weakness   . History of asthma   . History of transient ischemic attack (TIA)   . Type 2 diabetes mellitus   . Syncope and collapse     Past Medical History:   Diagnosis Date   . Anemia    . Asthma    . Atrial fibrillation    . Chronic obstructive pulmonary disease    . Convulsions    . Depression    . Diabetes mellitus     diet controlled    . Hyperlipidemia    . Hypertension    . TIA (transient ischemic attack)      Past Surgical History:   Procedure Laterality Date   . CARDIAC CATHETERIZATION  2017   . HYSTERECTOMY     . pci         X-Rays/Tests/Labs:  Lab Results   Component Value Date/Time    HGB 10.6 (L) 06/17/2019 05:19 AM    HCT 32.8 (L) 06/17/2019 05:19 AM    K 4.3 06/17/2019 04:36 AM    NA 137 06/17/2019 04:36 AM    INR 1.1 06/16/2019 03:01 PM    TROPI <0.01 06/16/2019 03:01 PM    TROPI 0.02 05/20/2019 03:41 PM    TROPI <0.01 05/12/2019 08:36 PM    TROPI <0.01 05/12/2019 06:33 AM     MRI Brain W WO Contrast [[id number]] (Order [id number])      IMPRESSION:    Motion limited exam. No evidence of acute intracranial  abnormality.      Moderate diffuse white matter disease, nonspecific but most often  reflects chronic small vessel ischemic changes.    Social History:  Lives with daughter and 4 grandchildren ranging in ages from 23yo to 31yo.   In a house .  Entry Steps: 13   Rails: yes Inside steps: flight  Rails: yes  Equipment at home:  single point cane and tub/shower combination  Prior Level of Function:     Cognition: wfl?    Mobility/Locomotion: mod I with cane and furniture surfs   Feeding: ind   Grooming: ind   Bathing: ind   Dressing: ind   Toileting: ind, but difficulty off the toilet.    Subjective   Patient is agreeable to participation in the therapy session. Nursing clears patient for  therapy.  Patient's Goal:  None stated  Pain: pt denies pain      Objective     Precautions/ Contraindications:   Precautions  Weight Bearing Status: no restrictions  Other Precautions: fall    Patient is in bed with  Telemetry, Intravenous Access and Bed Alarm in place.  Observation of patient/vitals:      Vitals:    06/17/19 1349 06/17/19 1350 06/17/19 1352 06/17/19 1612   BP: 112/70 116/76 120/78 116/76   Pulse: (!) 103 (!) 107 (!) 112 90   Resp:    17   Temp: 98.4 F (36.9 C)   98 F (36.7 C)   TempSrc: Oral   Oral   SpO2: 96% 97% 95% 98%   Weight:       Height:           Orientation/Cognition:  Alert and Oriented x 3, no date even with cues  Cognition: follows commands, impaired safety awareness, insight and STM      Musculoskeletal Examination:        ROM Strength   Neck/ Trunk Wfl, trunk limited by girth 4-/5   RLE wfl 4/5   LLE wfl 4/5     Sensation: N/T LLE form knee to toes and LUE from just distal to shoulder to fingers  Coordination: fair+ BLE    Functional Mobility:  Rolling: ind    Supine to sit: ind  Scooting: ind  Sit to Supine: ind  Sit to stand: ind  Stand to sit: CGA  Transfers: min a with RW    Ambulation:     Weightbearing: fwb   Assistance level: initially CGA , min a at end of session    Distance: 0'   Assistive Device: RW   Gait Deviations: initially no c/o.  At 5' pt states she is very dizzy and returned to EOB.  Needs further assessment   Stairs: NT    Balance:  Static Sit: good  Dynamic Sit: good  Static Stand: good  Dynamic Stand: fiar with RW    Endurance: limited by dizziness    Participation:  good    Education:  Educated the patient to role of physical therapy, plan of care, goals  of therapy, rationale for progressing mobility and safety with mobility and ADLs.    RN notified of session outcome and that patient was left in bed with all needs met and equipment intact.   Safety measures include: handoff to nurse/clin tech/ unit secretary completed, bed alarm activated, oriented  to call bell and placed within reach, personal items within reach, assistive device positioned out of reach and bed placed in lowest position.   Mobility and ADL status posted at bedside and within E.M.R.    AM-PACT Inpatient Short Forms  Inpatient AM-PACT Performed? (PT): Basic Mobility Inpatient Short Form   AM-PACT "6 Clicks" Basic Mobility Inpatient Short Form  Turning Over in Bed: None  Sitting Down On/Standing From Armchair: None  Lying on Back to Sitting on Side of Bed: None  Assist Moving to/from Bed to Chair: None  Assist to Walk in Hospital Room: A little  Assist to Climb 3-5 Steps with Railing: A little  PT Basic Mobility Raw Score: 22  CMS 0-100% Score: 20.91%      Goals  Goal Formulation: With patient  Time for Goal Acheivement: 3 visits  Goals: Select goal  Pt Will Ambulate: > 200 feet;with supervision(with LRAD safely)  Pt Will Go Up / Down Stairs: 1 flight;with supervision;With rail;With [facility]         Therapist PPE during session procedural mask, face shield  and gloves     Signature:  Novella Olive, PT  06/17/2019  4:33 PM  Phone: [phone]     (For scheduling questions, please contact rehab tech [phone])  Attention MD:   Thank you for allowing [place] to participate in the care of Kerston Landeck. Regulations from the [employer] for Medicare and Medicaid Services (CMS) require your review and approval of this plan of care.     Please cosign this note indicating you are in agreement with theTherapy Plan of Care so we may initiate the therapy treatment plan.
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
## 2019-06-17 NOTE — Progress Notes (Signed)
Pt stated her outpt neurologist is dr. Earlie Lou. Called office [phone] several times but no answer. Will try again

## 2019-06-17 NOTE — UM Notes (Signed)
Initial clinical review 06/16/2019        63 y.o. female with h/o A. Fib, seizure, anemia, asthma, COPD,  DM, TIA/CVA, HLD, HTN, and depression who presents the ED for evaluation of chest pain, headache, and left sided weakness. Patient reports she woke up this morning with generalized weakness causing her to fall and hit her head. Denies LOC. Patient reports 1 hour PTA she developed a headache, chest pain, left sided weakness, and difficulty finding words. Denies fever/chills, Patient reports previos TIA/CVA denies any residual deficits. Ambulated with a cane at baseline. Patient currently anticoagulated on Eliquis for A-Fib.     Physical exam    BP 140/66, hr 90, rr 18, temp 97.4, spo2 96%, wt 105.1 kg, pain 10    Constitutional: Well developed, well nourished. No distress noted.  Cardiovascular: Regular rate. Regular rhythm. No murmurs, rubs, or gallops.  Pulmonary/Chest: No evidence of respiratory distress.Clear to auscultation bilaterally. No wheezing, rales or rhonchi.   Neurological: Alert, awake, and appropriate. Normal speech. Motor normal. Right sided facial droop. Cranial nerves II-XII are intact. Strength is 4/5 in the left upper and lower and 5/5 in the right upper and lower extremities bilaterally. No sensory deficits to light touch.  Slight left pronator drift.No aphasia or dysphasia, Normal speech. Normal cerebellar function during finger-nose-finger and heel to shin.         hgb 10.3  hct 31.5  Rbc 3.66  Bun 6  Alb 3.4  trop < 0.01      MR Angiogram Neck W WO Contrast   Motion limited exam. No evidence of vascular occlusion.      MRI Brain W WO Contrast    Motion limited exam. No evidence of acute intracranial  abnormality.      Moderate diffuse white matter disease, nonspecific but most often reflects chronic small vessel ischemic changes.      MR Angiogram Head WO Contrast   No evidence of significant stenosis within the circle of  [place].      Chest AP Portable   No acute process  seen.    CT Head WO Contrast    No acute process seen. Stable exam.    EKG   NORMAL SINUS RHYTHM T WAVE ABNORMALITY, CONSIDER LATERAL ISCHEMIA ABNORMAL ECG    Medication Administration from 06/16/2019 1347 to 06/16/2019 1905      Date/Time Order Dose Route Action by    06/16/2019 1507 diphenhydrAMINE (BENADRYL) injection 25 mg  Intravenous Daune Perch, FNP    06/16/2019 1551 ondansetron (ZOFRAN) injection 4 mg 4 mg Intravenous Justice Rocher, RN    06/16/2019 1551 butalbital-acetaminophen-caffeine (FIORICET) 50-325-40 MG per tablet 1 tablet 1 tablet Oral Justice Rocher, RN    06/16/2019 1608 aspirin tablet 325 mg 325 mg Oral Justice Rocher, RN    06/16/2019 1608 diphenhydrAMINE (BENADRYL) injection 25 mg 25 mg Intravenous Justice Rocher, RN    06/16/2019 1700 LORazepam (ATIVAN) injection 1 mg 1 mg Intravenous Justice Rocher, RN    06/16/2019 1830 gadobutrol (GADAVIST) injection SOLN 10 mmol 10 mmol Intravenous Nadara Mustard       Place in Observation Services (Order [id number])   Admission   Date: 06/16/2019 Department: [facility] Ordering/Authorizing: Daune Perch, FNP     Chief Complaint: Chest Pain and Headache    Diagnosis:  Final diagnoses:   Left-sided weakness   Chest pain, unspecified type   Migraine without aura and without status migrainosus, not intractable     Admit to [facility]  Hospitalist service    Left-sided weakness rule out acute stroke  -Neuro check  -Follow-up vitamin B12 and TSH level  -Follow-up neurology recommendation  -Continue Eliquis, lipitor   - Echo 06/2018  Showed Normal EF,mod AR    Paroxysmal atrial fibrillation  -Continue rate control with metoprolol   -Continue Eliquis    Syncope episode  - cont' tele monitoring  -Could be due to arrhythmia and hypotension    Chest pain  - F/U cardiac enzymes  - cont' BB,eliquis, statin    History of seizure and TIA  -Continue Keppra    Diabetes  -Diet control  -ssi, FS monitoring  -Follow-up hemoglobin  A1c    History of depression and anxiety  -Continue  Seroquel , Lamictal, Ativan    History of asthma/COPD   -Currently stable with no wheezing  -Inhaler as needed    H/O GI bleed  - cont' protonix  - CBC monittoring    Disposition:     Anticipated medical stability for discharge: Per medical improvement  Service status: Observation        Mertie Moores, RN, MSN  CM  [phone]

## 2019-06-17 NOTE — Discharge Instr - AVS First Page (Signed)
Reason for your Hospital Admission:  dissociative disorder       Instructions for after your discharge:  Follow p with psychiatrist as outpatient  Take your medications as prescribed

## 2019-06-17 NOTE — Final Progress Note (DC Note for stay less than 48 (Signed)
Date:06/17/2019   Patient Name: [name],[name]  Attending Physician: Aretha Parrot, MD  Today:   BP 116/76   Pulse 90   Temp 98 F (36.7 C) (Oral)   Resp 17   Ht 1.651 m (5\' 5" )   Wt 99.3 kg (219 lb)   LMP  (LMP Unknown)   SpO2 98%   BMI 36.44 kg/m   Ranges for the last 24 hours:  Temp:  [97.9 F (36.6 C)-98.4 F (36.9 C)] 98 F (36.7 C)  Heart Rate:  [78-112] 90  Resp Rate:  [16-18] 17  BP: (110-129)/(68-78) 116/76    Date of Admission:   06/16/2019    Date of Discharge:   06/17/2019    Outcome of Hospitalization:   Active Problems:    Chest pain    Paroxysmal atrial fibrillation    Hypertension    Seizure disorder    Depression    Anemia    Left-sided weakness    History of asthma    History of transient ischemic attack (TIA)    Type 2 diabetes mellitus    Syncope and collapse  Resolved Problems:    * No resolved hospital problems. *     Significant Events:   [name] is a 63 y.o. female presenting with PMH of COPD/Asthma,HTN,DM control with Diet, TIA,Seizure , depression anxiety presented to emergency room with facial droop and left sided weakness. Work up for stroke was negative. 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 Started on Terazosin for her nightmares. Seen by neurology as well and cleared for discharge. Discharged in good condition.   Paroxysmal atrial fibrillation  -Continue rate control with metoprolol   -Continue Eliquis      History of seizure and TIA  -Continue Keppra    Diabetes  -Diet control  History of depression and anxiety  -Continue  Seroquel , Lamictal, Ativan  History of asthma/COPD     PE  General: awake, alert ,in no acute distress  Cardiovascular: regular rate and rhythm, no murmurs  Lungs: clear to auscultation bilaterally, no additional sounds  Abdomen: soft, non-tender, non-distended; normoactive bowel sounds  Extremities: no edema  Neurological: Alert and oriented X 3, moves all extremities.    Other:      Lab Results last 48 Hours     Procedure Component Value Units Date/Time    Glucose Whole Blood - POCT [[id number]]  (Abnormal) Collected:  06/17/19 1138     Updated:  06/17/19 1239     Whole Blood Glucose POCT 147 mg/dL     Hemoglobin O9G [[id number]] Collected:  06/17/19 0519    Specimen:  Blood Updated:  06/17/19 1039     Hemoglobin A1C 5.7 %      Average Estimated Glucose 116.9 mg/dL     Narrative:       Rescheduled by [id number] at 06/17/2019 04:46 Reason: Difficult draw/Unable   to collect specimen    Vitamin B12 [[id number]] Collected:  06/17/19 0436    Specimen:  Blood Updated:  06/17/19 1034     Vitamin B-12 494 pg/mL     Hemolysis index [[id number]]  (Abnormal) Collected:  06/17/19 0436     Updated:  06/17/19 1023     Hemolysis Index 68    TSH [[id number]]  (Abnormal) Collected:  06/17/19 0436    Specimen:  Blood Updated:  06/17/19 0641     TSH 0.34 uIU/mL     Basic Metabolic Panel [[id number]]  (  Abnormal) Collected:  06/17/19 0436    Specimen:  Blood Updated:  06/17/19 0622     Glucose 148 mg/dL      BUN 5 mg/dL      Creatinine 0.8 mg/dL      Calcium 8.4 mg/dL      Sodium 960 mEq/L      Potassium 4.3 mEq/L      Chloride 106 mEq/L      CO2 20 mEq/L      Anion Gap 11.0    GFR [[id number]] Collected:  06/17/19 0436     Updated:  06/17/19 0622     EGFR >60.0    CBC without differential [[id number]]  (Abnormal) Collected:  06/17/19 0519    Specimen:  Blood Updated:  06/17/19 0622     WBC 7.51 x10 3/uL      Hgb 10.6 g/dL      Hematocrit 13.0 %      Platelets 279 x10 3/uL      RBC 3.80 x10 6/uL      MCV 86.3 fL      MCH 27.9 pg      MCHC 32.3 g/dL      RDW 13 %      MPV 9.9 fL      Nucleated RBC 0.3 /100 WBC      Absolute NRBC 0.02 x10 3/uL     Narrative:       Rescheduled by [id number] at 06/17/2019 04:46 Reason: Difficult draw/Unable   to collect specimen    Glucose Whole Blood - POCT [[id number]]  (Abnormal) Collected:  06/17/19 0549     Updated:  06/17/19 0601     Whole Blood Glucose POCT 161 mg/dL     Glucose Whole  Blood - POCT [[id number]] Collected:  06/16/19 2109     Updated:  06/16/19 2112     Whole Blood Glucose POCT 90 mg/dL     UA, Reflex to Microscopic [[id number]]  (Abnormal) Collected:  06/16/19 1633    Specimen:  Urine Updated:  06/16/19 1650     Urine Type Clean Catch     Color, UA Straw     Clarity, UA Clear     Specific Gravity UA 1.008     Urine pH 6.0     Leukocyte Esterase, UA Negative     Nitrite, UA Negative     Protein, UR Negative     Glucose, UA Negative     Ketones UA Negative     Urobilinogen, UA Negative mg/dL      Bilirubin, UA Negative     Blood, UA Small     RBC, UA 0 - 2 /hpf      WBC, UA 0 - 5 /hpf      Squamous Epithelial Cells, Urine 0 - 5 /hpf     B-type Natriuretic Peptide [[id number]] Collected:  06/16/19 1501    Specimen:  Blood Updated:  06/16/19 1559     B-Natriuretic Peptide <10 pg/mL     Troponin I [[id number]] Collected:  06/16/19 1501    Specimen:  Blood Updated:  06/16/19 1552     Troponin I <0.01 ng/mL     GFR [[id number]] Collected:  06/16/19 1501     Updated:  06/16/19 1546     EGFR >60.0    Comprehensive metabolic panel [[id number]]  (Abnormal) Collected:  06/16/19 1501    Specimen:  Blood Updated:  06/16/19 1546     Glucose 98 mg/dL      BUN 6  mg/dL      Creatinine 0.8 mg/dL      Sodium 914 mEq/L      Potassium 3.7 mEq/L      Chloride 106 mEq/L      CO2 25 mEq/L      Calcium 8.9 mg/dL      Protein, Total 6.1 g/dL      Albumin 3.4 g/dL      AST (SGOT) 12 U/L      ALT 11 U/L      Alkaline Phosphatase 78 U/L      Bilirubin, Total 0.2 mg/dL      Globulin 2.7 g/dL      Albumin/Globulin Ratio 1.3     Anion Gap 12.0    PT/APTT [[id number]] Collected:  06/16/19 1501     Updated:  06/16/19 1540     PT 14.1 sec      PT INR 1.1     PTT 35 sec     CBC and differential [[id number]]  (Abnormal) Collected:  06/16/19 1501    Specimen:  Blood Updated:  06/16/19 1521     WBC 7.04 x10 3/uL      Hgb 10.3 g/dL      Hematocrit 62.9 %      Platelets 260 x10 3/uL      RBC 3.66 x10 6/uL      MCV 86.1 fL      MCH  28.1 pg      MCHC 32.7 g/dL      RDW 13 %      MPV 9.7 fL      Neutrophils 60.5 %      Lymphocytes Automated 26.7 %      Monocytes 7.2 %      Eosinophils Automated 4.4 %      Basophils Automated 0.6 %      Immature Granulocytes 0.6 %      Nucleated RBC 0.0 /100 WBC      Neutrophils Absolute 4.26 x10 3/uL      Lymphocytes Absolute Automated 1.88 x10 3/uL      Monocytes Absolute Automated 0.51 x10 3/uL      Eosinophils Absolute Automated 0.31 x10 3/uL      Basophils Absolute Automated 0.04 x10 3/uL      Immature Granulocytes Absolute 0.04 x10 3/uL      Absolute NRBC 0.00 x10 3/uL     Glucose Whole Blood - POCT [[id number]] Collected:  06/16/19 1423     Updated:  06/16/19 1425     Whole Blood Glucose POCT 92 mg/dL           Procedures performed:   Radiology: all results from this admission  Cta  Head And Neck    Result Date: 05/20/2019   Diminished opacification of the M3 and M4 branches of the right middle artery suggestive of occlusion of these vessels. Merri Ray, MD  05/20/2019 5:06 PM    Ct Head Wo Contrast    Result Date: 06/16/2019   No acute process seen. Stable exam. Charlene Brooke, MD  06/16/2019 2:37 PM    Ct Head Without Contrast    Result Date: 05/20/2019    No acute intracranial abnormality.  Other findings as above. Gustavus Messing, MD  05/20/2019 3:37 PM    Mr Angiogram Head Wo Contrast    Result Date: 06/16/2019   No evidence of significant stenosis within the circle of Willis.  END OF IMPRESSION  Gustavus Messing, MD  06/16/2019 6:48 PM  Mr Angiogram Neck W Wo Contrast    Result Date: 06/16/2019   Motion limited exam. No evidence of vascular occlusion. END OF IMPRESSION  Gustavus Messing, MD  06/16/2019 6:56 PM    Mri Brain W Wo Contrast    Result Date: 06/16/2019   Motion limited exam. No evidence of acute intracranial abnormality.  Moderate diffuse white matter disease, nonspecific but most often reflects chronic small vessel ischemic changes. Gustavus Messing, MD  06/16/2019 6:52 PM    Mri Brain W Wo Contrast    Result Date:  05/20/2019   Mild microvascular ischemic changes. No acute infarct is demonstrated. Merri Ray, MD  05/20/2019 8:52 PM    Chest Ap Portable    Result Date: 06/16/2019   No acute process seen. Charlene Brooke, MD  06/16/2019 3:14 PM    Radiology: all results in the last 7 days  Ct Head Wo Contrast    Result Date: 06/16/2019   No acute process seen. Stable exam. Charlene Brooke, MD  06/16/2019 2:37 PM    Mr Angiogram Head Wo Contrast    Result Date: 06/16/2019   No evidence of significant stenosis within the circle of Willis.  END OF IMPRESSION  Gustavus Messing, MD  06/16/2019 6:48 PM    Mr Angiogram Neck W Wo Contrast    Result Date: 06/16/2019   Motion limited exam. No evidence of vascular occlusion. END OF IMPRESSION  Gustavus Messing, MD  06/16/2019 6:56 PM    Mri Brain W Wo Contrast    Result Date: 06/16/2019   Motion limited exam. No evidence of acute intracranial abnormality.  Moderate diffuse white matter disease, nonspecific but most often reflects chronic small vessel ischemic changes. Gustavus Messing, MD  06/16/2019 6:52 PM    Chest Ap Portable    Result Date: 06/16/2019   No acute process seen. Charlene Brooke, MD  06/16/2019 3:14 PM    Radiology: all results in the last 24 hours  No results found.    Treatment Team:   Attending Provider: Aretha Parrot, MD  Consulting Physician: Bland Span, MD    Disposition:   Disposition: Home or Self Care    Condition at Discharge:   good     Follow up Recommendations for Receiving Provider     1.   Unresulted Labs     None          Discharge Instructions:     Follow-up Information     Malcolm Metro, MD Follow up.    Specialty:  Family Medicine  Contact information:  [address]  [place] [address]  [phone]                         There is no immunization history on file for this patient.  Extended Emergency Contact Information  Primary Emergency Contact: Grier,Kira  Address: [address]           [place], [place] [address] [place]   Home Phone: [phone]  Mobile Phone: [phone]  Relation: Daughter  Prior     Discharge Medication List      Taking    acetaminophen 325 MG tablet  Dose:  650 mg  Commonly known as:  TYLENOL  Take 2 tablets (650 mg total) by mouth every 4 (four) hours as needed for Pain     albuterol sulfate HFA 108 (90 Base) MCG/ACT inhaler  Dose:  1 puff  Commonly known as:  PROVENTIL  For:  Asthma, Chronic Obstructive Lung Disease  Inhale 1 puff into the lungs daily Also take it PRN every 20 minutes.     amLODIPine 2.5 MG tablet  Dose:  2.5 mg  Commonly known as:  NORVASC  Take 1 tablet (2.5 mg total) by mouth daily     apixaban 5 MG  Dose:  5 mg  Commonly known as:  ELIQUIS  Take 1 tablet (5 mg total) by mouth every 12 (twelve) hours     Ativan 0.5 MG tablet  Dose:  0.5 mg  For:  Feeling Anxious  Generic drug:  LORazepam  Take 0.5 mg by mouth daily as needed for Anxiety     atorvastatin 40 MG tablet  Dose:  40 mg  Commonly known as:  LIPITOR  Take 40 mg by mouth daily     budesonide-formoterol 80-4.5 MCG/ACT inhaler  Dose:  2 puff  Commonly known as:  SYMBICORT  For:  Asthma, Chronic Obstructive Lung Disease  Inhale 2 puffs into the lungs daily     furosemide 20 MG tablet  Dose:  20 mg  What changed:  additional instructions  Commonly known as:  LASIX  Take 1 tablet (20 mg total) by mouth daily     lactobacillus/streptococcus Caps  Dose:  1 capsule  Take 1 capsule by mouth daily     lamoTRIgine 150 MG tablet  Dose:  150 mg  Commonly known as:  LaMICtal  Take 1 tablet (150 mg total) by mouth 2 (two) times daily     Latuda 40 MG Tabs  Dose:  40 mg  For:  Depressive Phase of Manic-Depression  Generic drug:  Lurasidone HCl  Take 40 mg by mouth nightly     levETIRAcetam 500 MG tablet  Dose:  500 mg  Commonly known as:  KEPPRA  Take 500 mg by mouth 2 (two) times daily     metoprolol succinate XL 25 MG 24 hr tablet  Dose:  25 mg  Commonly known as:  TOPROL-XL  Take 25 mg by mouth daily     pantoprazole 40 MG tablet  Dose:  40 mg   Commonly known as:  PROTONIX  Take 1 tablet (40 mg total) by mouth daily     psyllium 58.12 % Pack packet  Dose:  1 packet  Commonly known as:  METAMUCIL  Take 1 packet by mouth daily     QUEtiapine 50 MG tablet  Dose:  50 mg  Commonly known as:  SEROquel  Take 1 tablet (50 mg total) by mouth nightly     rOPINIRole 1 MG tablet  Dose:  1 mg  Commonly known as:  REQUIP  Take 1 tablet (1 mg total) by mouth nightly     terazosin 2 MG capsule  Dose:  2 mg  Commonly known as:  HYTRIN  Take 1 capsule (2 mg total) by mouth nightly            Signed by: Aretha Parrot, MD

## 2019-06-17 NOTE — Plan of Care (Signed)
Problem: Pain  Goal: Pain at adequate level as identified by patient  Outcome: Progressing  Flowsheets (Taken 06/17/2019 1124)  Pain at adequate level as identified by patient:   Assess for risk of opioid induced respiratory depression, including snoring/sleep apnea. Alert healthcare team of risk factors identified.   Identify patient comfort function goal   Consult/collaborate with Physical Therapy, Occupational Therapy, and/or Speech Therapy   Evaluate if patient comfort function goal is met  Note: Patient is alert and oriented. Vitals stable. SR/ST on tele. Pt c/o headache. Dr. Marquis Lunch informed. Fiorecet,benadryl, and regaln given. Continue pain management.

## 2019-06-17 NOTE — OT Eval Note (Signed)
[facility]  [address]  [place], [place] [address]  [phone]    Occupational Therapy Evaluation    [name] MRN: [id number]   Unit: [facility] INTERMEDIATE CARE Bed: MI622/MI622-01    Time of treatment: Time Calculation  OT Received On: 06/17/19  Start Time: 1419  Stop Time: 1430  Time Calculation (min): 11 min    Consult received for [name] for OT evaluation and treatment.  Patient's medical condition is appropriate for Occupational Therapy  intervention at this time.    Interpreter utilized: no, not indicated    D/C Suggestions   Based on today's performance, recommended discharge is to home with 24/7 assist HHOT.  If recommended d/c disposition is not available, patient will need SNF.  Transport Recommendations: no limitations    Equipment recommended for discharge: [facility]    Discharge recommendations are based on patient's progression/regression. Please see most recent note for updated discharge recommendations.    Assessment     [name] is a 63 y.o. female admitted 06/16/2019.  Pt admitted with headache and chest pain, L side N/T. Pt noted with recent hospitalization for similar symptoms, imaging all negative. Pt reports she lives with her family in a house and is never alone. On eval, pt appears to be falling asleep when not engaged in conversation or task. Pt does not fully participate in MMT at this time, but strength appears [facility] in function. Pt is supv for bed mobility and socks while seated at EOB. Pt will benefit from return home with supv.      Brief chart review completed including review of labs, review of imaging and review of vitals.  Pt's ability to complete ADLs and functional transfers is impaired due to the following deficits: decreased activity tolerance, decreased balance, decreased bed mobility, behavioral issues, decreased coordination, decreased insight, decreased judgment, decreased safety awareness, decreased problem solving, decreased strength and transfers .   Pt demonstrates performance deficits with bathing, toileting and functional mobility. There are a few comorbidities or other factors that affect plan of care and require modification of task including: anxiety, assistive device needed for mobility, frequent falls, residual neurological symptoms, vertigo/dizziness, has stairs to manage and home alone for a portion of the day.  Pt would continue to benefit from OT to address these deficits and increase functional independence.          Complexity Chart Review Performance Deficits Clinical Decision Making Hx/Comorbidities Assistance needed   Low  Brief 1-3 Limited options None None (or at baseline)       PMP - Progressive Mobility Protocol   PMP Activity: Step 4 - Dangle at Bedside     Rehabilitation Potential: good for goals    Interdisciplinary Communication: discussed with PT/RN    Plan     OT Plan  Risks/Benefits/POC Discussed with Pt/Family: With patient  Treatment Interventions: ADL retraining;UE strengthening/ROM;Functional transfer training;Endurance training;Patient/Family training;Equipment eval/education;Compensatory technique education  Discharge Recommendation: Home with supervision  DME Recommended for Discharge: [facility]  OT Frequency Recommended: 1-2x/wk         Medical Diagnosis: Left-sided weakness [R53.1]  Migraine without aura and without status migrainosus, not intractable [G43.009]  Chest pain, unspecified type [R07.9]    History of Present Illness: [name] is a 63 y.o. female admitted on  06/16/2019 with "PMH of COPD/Asthma,HTN,DM control with Diet, TIA,Seizure , depression anxiety presented to emergency room for syncope episode, left-sided weakness, right facial droop.  She woke up this morning , [name] out  of the bed, she had dizziness episode with fainting.  She did not she did not have seizure or fever.  Later on she developed right facial droop and left upper and lower extremity numbness and weakness with no slurred speech.  Also she  developed chest pain at the sternal area.  Work-up in the emergency room showed unremarkable chest x-ray and CT of head normal troponin level.  Now she is going to be admitted for further evaluation and treatment"      Patient Active Problem List   Diagnosis   . CVA (cerebral vascular accident)   . Chest pain   . Chest pain with high risk of acute coronary syndrome   . Paroxysmal atrial fibrillation   . Hypertension   . Hyperlipidemia   . Seizure disorder   . History of gastrointestinal hemorrhage   . Depression   . Anemia   . Asthma   . Non-traumatic subcutaneous emphysema   . Chest pain with moderate risk for cardiac etiology   . Left-sided weakness   . History of asthma   . History of transient ischemic attack (TIA)   . Type 2 diabetes mellitus   . Syncope and collapse     Past Medical History:   Diagnosis Date   . Anemia    . Asthma    . Atrial fibrillation    . Chronic obstructive pulmonary disease    . Convulsions    . Depression    . Diabetes mellitus     diet controlled    . Hyperlipidemia    . Hypertension    . TIA (transient ischemic attack)      Past Surgical History:   Procedure Laterality Date   . CARDIAC CATHETERIZATION  2017   . HYSTERECTOMY     . pci           X-Rays/Tests/Labs:  Lab Results   Component Value Date/Time    HGB 10.6 (L) 06/17/2019 05:19 AM    HCT 32.8 (L) 06/17/2019 05:19 AM    K 4.3 06/17/2019 04:36 AM    NA 137 06/17/2019 04:36 AM    INR 1.1 06/16/2019 03:01 PM    TROPI <0.01 06/16/2019 03:01 PM    TROPI 0.02 05/20/2019 03:41 PM    TROPI <0.01 05/12/2019 08:36 PM    TROPI <0.01 05/12/2019 06:33 AM     MRI Brain W WO Contrast (Order: [id number]) - 06/16/2019   IMPRESSION:    Motion limited exam. No evidence of acute intracranial  abnormality.      Moderate diffuse white matter disease, nonspecific but most often  reflects chronic small vessel ischemic changes.    Chest AP Portable (Order: [id number]) - 06/16/2019   IMPRESSION:    No acute process seen.    Social History:  Lives with  daughter in a house.  Entry Steps: 0  Rails: 0 Inside steps: 12 Rails: yes  Equipment at home: tub/shower, shower chair, [facility],   Prior Level of Function: indep ADL, Does not drive.    Cognition: WFL    Mobility/Locomotion: Indep   Feeding: Indep   Grooming: Indep   Bathing: Indep   Dressing: Indep   Toileting: Indep    Subjective     Patient is agreeable to participation in the therapy session. Nursing clears patient for therapy.  Patient's Goal:  To get better and go home  Pain: no evidence of pain noted during session    Objective     Precautions:   Precautions  Weight Bearing Status: no restrictions  Other Precautions: fall    Patient is in bed with  Telemetry, Intravenous Access and Bed Alarm  in place.     Observation of patient/vitals:   Vitals:    06/17/19 1212 06/17/19 1349 06/17/19 1350 06/17/19 1352   BP: 116/70 112/70 116/76 120/78   Pulse: (!) 101 (!) 103 (!) 107 (!) 112   Resp: 18      Temp: 98.2 F (36.8 C) 98.4 F (36.9 C)     TempSrc: Oral Oral     SpO2: 96% 96% 97% 95%   Weight:       Height:           Orientation/Cognition:     Alert and Oriented x 4  Cognition: follows simple commands, imp safety, insight      Musculoskeletal Examination:   ROM Strength   RUE WFL WFL   LUE WFL WFL     Sensation: Intact to light touch, denies N/T throughout B UE's.   Coordination: Intact gross motor and serial opposition to B hands    Vision: WFL  Hearing: WFL      Functional Mobility:    Supine to sit: supv  Scooting: supv  Sit to Supine: supv  Sit to stand: supv  Stand to sit: supv  Transfers: off/on bed    Balance:  Static Sit Balance: good  Dynamic Sit Balance: fair +  Static Stand Balance: fair   Dynamic Stand Balance: fair     Self Care:  Grooming: set up to wipe face  LB Dressing: supv sock      Endurance: good    Participation:  good    Education:  Educated the patient to role of occupational therapy, plan of care, goals  of therapy and safety with mobility and ADLs.    RN notified of session outcome  and that patient was left in bed with all needs met and equipment intact.   Safety measures include: handoff to nurse/clin tech/ unit secretary completed, bed alarm activated, oriented to call bell and placed within reach, personal items within reach, assistive device positioned out of reach and bed placed in lowest position.   Mobility and ADL status posted at bedside and within E.M.R.                Goals:  Goals  Goal Formulation: Patient  Time For Goal Achievement: 3 visits  Goals: Select goal  Patient will groom self: Modified Independent  Patient will dress lower body: Modified Independent  Patient will toilet: Modified Independent        Signature:   [name], OTR/L  06/17/2019  3:39 PM  Phone [phone]    (For scheduling questions, please contact rehab tech [phone])    Therapist PPE during session procedural mask, gown  and gloves

## 2019-06-17 NOTE — Plan of Care (Addendum)
Problem: Safety  Goal: Patient will be free from injury during hospitalization  Outcome: Progressing  Flowsheets (Taken 06/17/2019 1122)  Patient will be free from injury during hospitalization:  . Use appropriate transfer methods  . Assess patient's risk for falls and implement fall prevention plan of care per policy  . Hourly rounding  Note: Patient with hx of convulsion. Antiseizure meds given. Sz precaution maintained.   Patient c/o shakiness, and hearing voices. Dr. Marquis Lunch made aware. Awaiting for neurology consult.

## 2019-06-23 ENCOUNTER — Emergency Department: Payer: Medicare Other

## 2019-06-23 ENCOUNTER — Emergency Department
Admission: EM | Admit: 2019-06-23 | Discharge: 2019-06-23 | Disposition: A | Discharge status: Home / Self Care | Payer: Medicare Other | Attending: Emergency Medicine | Admitting: Emergency Medicine

## 2019-06-23 DIAGNOSIS — R072 Precordial pain: Secondary | ICD-10-CM | POA: Insufficient documentation

## 2019-06-23 DIAGNOSIS — Z7901 Long term (current) use of anticoagulants: Secondary | ICD-10-CM | POA: Insufficient documentation

## 2019-06-23 DIAGNOSIS — Z91040 Latex allergy status: Secondary | ICD-10-CM | POA: Insufficient documentation

## 2019-06-23 DIAGNOSIS — R519 Headache, unspecified: Secondary | ICD-10-CM

## 2019-06-23 DIAGNOSIS — R51 Headache: Secondary | ICD-10-CM | POA: Insufficient documentation

## 2019-06-23 DIAGNOSIS — R079 Chest pain, unspecified: Secondary | ICD-10-CM

## 2019-06-23 DIAGNOSIS — E119 Type 2 diabetes mellitus without complications: Secondary | ICD-10-CM | POA: Insufficient documentation

## 2019-06-23 DIAGNOSIS — I10 Essential (primary) hypertension: Secondary | ICD-10-CM | POA: Insufficient documentation

## 2019-06-23 DIAGNOSIS — J449 Chronic obstructive pulmonary disease, unspecified: Secondary | ICD-10-CM | POA: Insufficient documentation

## 2019-06-23 DIAGNOSIS — Z7951 Long term (current) use of inhaled steroids: Secondary | ICD-10-CM | POA: Insufficient documentation

## 2019-06-23 DIAGNOSIS — I4891 Unspecified atrial fibrillation: Secondary | ICD-10-CM | POA: Insufficient documentation

## 2019-06-23 DIAGNOSIS — E785 Hyperlipidemia, unspecified: Secondary | ICD-10-CM | POA: Insufficient documentation

## 2019-06-23 LAB — COMPREHENSIVE METABOLIC PANEL
ALT: 6 U/L (ref 0–55)
AST (SGOT): 8 U/L (ref 5–34)
Albumin/Globulin Ratio: 1.3 (ref 0.9–2.2)
Albumin: 3.6 g/dL (ref 3.5–5.0)
Alkaline Phosphatase: 80 U/L (ref 37–106)
Anion Gap: 9 (ref 5.0–15.0)
BUN: 12 mg/dL (ref 7–19)
Bilirubin, Total: 0.3 mg/dL (ref 0.2–1.2)
CO2: 22 mEq/L (ref 22–29)
Calcium: 9.1 mg/dL (ref 8.5–10.5)
Chloride: 109 mEq/L (ref 100–111)
Creatinine: 1 mg/dL (ref 0.6–1.0)
Globulin: 2.8 g/dL (ref 2.0–3.6)
Glucose: 91 mg/dL (ref 70–100)
Potassium: 3.7 mEq/L (ref 3.5–5.1)
Protein, Total: 6.4 g/dL (ref 6.0–8.3)
Sodium: 140 mEq/L (ref 136–145)

## 2019-06-23 LAB — CBC AND DIFFERENTIAL
Absolute NRBC: 0 10*3/uL (ref 0.00–0.00)
Basophils Absolute Automated: 0.04 10*3/uL (ref 0.00–0.08)
Basophils Automated: 0.6 %
Eosinophils Absolute Automated: 0.27 10*3/uL (ref 0.00–0.44)
Eosinophils Automated: 3.9 %
Hematocrit: 33.4 % — ABNORMAL LOW (ref 34.7–43.7)
Hgb: 10.7 g/dL — ABNORMAL LOW (ref 11.4–14.8)
Immature Granulocytes Absolute: 0.05 10*3/uL (ref 0.00–0.07)
Immature Granulocytes: 0.7 %
Lymphocytes Absolute Automated: 2.06 10*3/uL (ref 0.42–3.22)
Lymphocytes Automated: 29.8 %
MCH: 28.1 pg (ref 25.1–33.5)
MCHC: 32 g/dL (ref 31.5–35.8)
MCV: 87.7 fL (ref 78.0–96.0)
MPV: 8.7 fL — ABNORMAL LOW (ref 8.9–12.5)
Monocytes Absolute Automated: 0.6 10*3/uL (ref 0.21–0.85)
Monocytes: 8.7 %
Neutrophils Absolute: 3.89 10*3/uL (ref 1.10–6.33)
Neutrophils: 56.3 %
Nucleated RBC: 0 /100 WBC (ref 0.0–0.0)
Platelets: 247 10*3/uL (ref 142–346)
RBC: 3.81 10*6/uL — ABNORMAL LOW (ref 3.90–5.10)
RDW: 13 % (ref 11–15)
WBC: 6.91 10*3/uL (ref 3.10–9.50)

## 2019-06-23 LAB — GFR: EGFR: >60

## 2019-06-23 LAB — PT AND APTT
PT INR: 1.2 — ABNORMAL HIGH (ref 0.9–1.1)
PT: 14.8 s (ref 12.6–15.0)
PTT: 34 s (ref 23–37)

## 2019-06-23 LAB — TROPONIN I: Troponin I: <0.01 ng/mL (ref 0.00–0.05)

## 2019-06-23 MED ORDER — DIPHENHYDRAMINE HCL 50 MG/ML IJ SOLN
12.50 mg | Freq: Once | INTRAMUSCULAR | Status: AC
Start: 2019-06-23 — End: 2019-06-23
  Administered 2019-06-23: 12.5 mg via INTRAVENOUS
  Filled 2019-06-23: qty 1

## 2019-06-23 MED ORDER — METOCLOPRAMIDE HCL 5 MG/ML IJ SOLN
10.00 mg | Freq: Once | INTRAMUSCULAR | Status: AC
Start: 2019-06-23 — End: 2019-06-23
  Administered 2019-06-23: 10 mg via INTRAVENOUS
  Filled 2019-06-23: qty 2

## 2019-06-23 NOTE — ED Provider Notes (Signed)
History     Chief Complaint   Patient presents with   . Chest Pain     [name] presents to the emergency room with a complaint of right-sided headache and chest pain that started last night.  She reports that the chest pain is substernal, does not radiate, and is intermittent.  She reports the sensation of heaviness without any exacerbating or alleviating factors.  She reports associated dyspnea and diaphoresis with her symptoms.  She currently denies chest pain that is time.  She went to [facility] earlier this morning at 3 AM was evaluated for chest pain.  No prior history of CAD.  She had a normal nuclear stress test in April, and a negative cardiac catheterization in 2017.  She is on Eliquis for history of A. fib.  She reports that her headache has been constant with no exacerbating or alleviating factors, and feels different from her typical migraines.  She was admitted last week for concern for CVA with slurred speech and left-sided weakness.  Symptoms are resolved and MRI studies during admission were negative.               Past Medical History:   Diagnosis Date   . Anemia    . Asthma    . Atrial fibrillation    . Chronic obstructive pulmonary disease    . Convulsions    . Depression    . Diabetes mellitus     diet controlled    . Hyperlipidemia    . Hypertension    . TIA (transient ischemic attack)        Past Surgical History:   Procedure Laterality Date   . CARDIAC CATHETERIZATION  2017   . HYSTERECTOMY     . pci         History reviewed. No pertinent family history.    Social  Social History     Tobacco Use   . Smoking status: Never Smoker   . Smokeless tobacco: Never Used   Substance Use Topics   . Alcohol use: Never     Frequency: Never   . Drug use: Never       .     Allergies   Allergen Reactions   . Singulair [Montelukast] Itching   . Myoview [Technetium-59m] Itching   . Contrast [Iodinated Diagnostic Agents]    . Latex    . Nitroglycerin    . Penicillins    . Tetracyclines & Related         Home Medications     Med List Status:  In Progress Set By: Tamala Julian, RN at 06/23/2019  3:07 PM                acetaminophen (TYLENOL) 325 MG tablet     Take 2 tablets (650 mg total) by mouth every 4 (four) hours as needed for Pain     albuterol (PROVENTIL HFA) 108 (90 Base) MCG/ACT inhaler     Inhale 1 puff into the lungs daily Also take it PRN every 20 minutes.     amLODIPine (NORVASC) 2.5 MG tablet     Take 1 tablet (2.5 mg total) by mouth daily     apixaban (ELIQUIS) 5 MG     Take 1 tablet (5 mg total) by mouth every 12 (twelve) hours     atorvastatin (LIPITOR) 40 MG tablet     Take 40 mg by mouth daily     budesonide-formoterol (SYMBICORT) 80-4.5 MCG/ACT inhaler  Inhale 2 puffs into the lungs daily     furosemide (LASIX) 20 MG tablet     Take 1 tablet (20 mg total) by mouth daily     Patient taking differently:  Take 20 mg by mouth daily Patient states she takes lasix every other day       lactobacillus/streptococcus (RISAQUAD) Cap     Take 1 capsule by mouth daily     lamoTRIgine (LAMICTAL) 150 MG tablet     Take 1 tablet (150 mg total) by mouth 2 (two) times daily     levETIRAcetam (KEPPRA) 500 MG tablet     Take 500 mg by mouth 2 (two) times daily     LORazepam (Ativan) 0.5 MG tablet     Take 0.5 mg by mouth daily as needed for Anxiety     Lurasidone HCl (Latuda) 40 MG Tab     Take 40 mg by mouth nightly     metoprolol succinate XL (TOPROL-XL) 25 MG 24 hr tablet     Take 25 mg by mouth daily     pantoprazole (PROTONIX) 40 MG tablet     Take 1 tablet (40 mg total) by mouth daily     psyllium (METAMUCIL) 58.12 % Pack packet     Take 1 packet by mouth daily     QUEtiapine (SEROQUEL) 50 MG tablet     Take 1 tablet (50 mg total) by mouth nightly     rOPINIRole (REQUIP) 1 MG tablet     Take 1 tablet (1 mg total) by mouth nightly     terazosin (HYTRIN) 2 MG capsule     Take 1 capsule (2 mg total) by mouth nightly           Review of Systems   Constitutional: Positive for diaphoresis. Negative for chills and  fever.   Respiratory: Positive for shortness of breath.    Cardiovascular: Positive for chest pain. Negative for leg swelling.   Gastrointestinal: Positive for nausea. Negative for abdominal pain.   Musculoskeletal: Negative for back pain.   Neurological: Positive for headaches. Negative for numbness.   Hematological: Bruises/bleeds easily.   All other systems reviewed and are negative.      Physical Exam    BP: 131/65, Heart Rate: 82, Temp: 98 F (36.7 C), Resp Rate: 18, SpO2: 100 %, Weight: 104 kg    Physical Exam  Vitals signs and nursing note reviewed.   Constitutional:       Appearance: She is well-developed.   HENT:      Head: Normocephalic and atraumatic.   Eyes:      Pupils: Pupils are equal, round, and reactive to light.   Neck:      Vascular: No JVD.   Cardiovascular:      Rate and Rhythm: Normal rate and regular rhythm.      Heart sounds: No murmur.   Pulmonary:      Effort: Pulmonary effort is normal. No respiratory distress.      Breath sounds: Normal breath sounds. No wheezing or rales.   Chest:      Chest wall: No tenderness.   Abdominal:      General: Bowel sounds are normal. There is no distension.      Palpations: Abdomen is soft.      Tenderness: There is no abdominal tenderness.   Skin:     General: Skin is warm and dry.   Neurological:      General: No focal deficit present.  Mental Status: She is alert and oriented to person, place, and time.      Cranial Nerves: No cranial nerve deficit.      Motor: No weakness.           MDM and ED Course     ED Medication Orders (From admission, onward)    Start Ordered     Status Ordering Provider    06/23/19 1553 06/23/19 1552  metoclopramide (REGLAN) injection 10 mg  Once     Route: Intravenous  Ordered Dose: 10 mg     Last MAR action:  Given Ashlea Dusing O    06/23/19 1553 06/23/19 1552  diphenhydrAMINE (BENADRYL) injection 12.5 mg  Once     Route: Intravenous  Ordered Dose: 12.5 mg     Last MAR action:  Given Jennings Stirling O          EKG [my interpretation]: NSR at 75 bpm, nonspecific T wave changes anterior leads, unremarkable EKG.    MDM  Number of Diagnoses or Management Options  Acute nonintractable headache, unspecified headache type:   Chest pain, unspecified type:   Diagnosis management comments: Work-up at [facility] reviewed, and her troponin was negative at that visit.  Given duration of symptoms, 1 more troponin should suffice in ruling out ACS.  Will get head CT for complaint of new headache.  Doubt PE given Eliquis use.    Unremarkable work-up for chest pain and headache.  Follow-up with PCP and cardiology for further evaluation.    Results     Procedure Component Value Units Date/Time    Troponin I [[id number]] Collected:  06/23/19 1551    Specimen:  Blood Updated:  06/23/19 1633     Troponin I <0.01 ng/mL     GFR [[id number]] Collected:  06/23/19 1551     Updated:  06/23/19 1621     EGFR >60.0    Comprehensive metabolic panel [[id number]] Collected:  06/23/19 1551    Specimen:  Blood Updated:  06/23/19 1621     Glucose 91 mg/dL      BUN 12 mg/dL      Creatinine 1.0 mg/dL      Sodium 086 mEq/L      Potassium 3.7 mEq/L      Chloride 109 mEq/L      CO2 22 mEq/L      Calcium 9.1 mg/dL      Protein, Total 6.4 g/dL      Albumin 3.6 g/dL      AST (SGOT) 8 U/L      ALT 6 U/L      Alkaline Phosphatase 80 U/L      Bilirubin, Total 0.3 mg/dL      Globulin 2.8 g/dL      Albumin/Globulin Ratio 1.3     Anion Gap 9.0    PT/APTT [[id number]]  (Abnormal) Collected:  06/23/19 1551     Updated:  06/23/19 1613     PT 14.8 sec      PT INR 1.2     PTT 34 sec     CBC and differential [[id number]]  (Abnormal) Collected:  06/23/19 1551    Specimen:  Blood Updated:  06/23/19 1601     WBC 6.91 x10 3/uL      Hgb 10.7 g/dL      Hematocrit 01.0 %      Platelets 247 x10 3/uL      RBC 3.81 x10 6/uL      MCV 87.7 fL  MCH 28.1 pg      MCHC 32.0 g/dL      RDW 13 %      MPV 8.7 fL      Neutrophils 56.3 %      Lymphocytes Automated 29.8 %      Monocytes 8.7 %       Eosinophils Automated 3.9 %      Basophils Automated 0.6 %      Immature Granulocytes 0.7 %      Nucleated RBC 0.0 /100 WBC      Neutrophils Absolute 3.89 x10 3/uL      Lymphocytes Absolute Automated 2.06 x10 3/uL      Monocytes Absolute Automated 0.60 x10 3/uL      Eosinophils Absolute Automated 0.27 x10 3/uL      Basophils Absolute Automated 0.04 x10 3/uL      Immature Granulocytes Absolute 0.05 x10 3/uL      Absolute NRBC 0.00 x10 3/uL         Radiology Results (24 Hour)     Procedure Component Value Units Date/Time    CT Head without Contrast [[id number]] Collected:  06/23/19 1631    Order Status:  Completed Updated:  06/23/19 1635    Narrative:       CT HEAD WO CONTRAST    CLINICAL INDICATION:   Headache, acute, normal neuro exam    TECHNIQUE: Noncontrast serial axial images skull base to vertex with  coronal and sagittal reconstruction.  Note that CT scanning at this site utilizes multiple dose reduction  techniques including automatic exposure control, adjustment of the MAA  and/or KVP according to the patient's size and use of iterative  reconstruction technique.    COMPARISON: CT scan of the head from 06/16/2019    FINDINGS: There is no mass effect or midline shift. There are no  abnormal intra or extra-axial fluid collections. Cortical sulci and  lateral ventricles are within normal limits for size and configuration.  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 The perimesencephalic cistern is  patent. No acute intracranial hemorrhage is identified. Areas of  relative hypodensity are seen in the white matter of the cerebral  hemispheres.  Visualized paranasal sinuses and mastoid air cells are well aerated.  No acute osseous abnormality is identified.      Impression:           1.  No abnormal mass or acute intracranial hemorrhage is identified.    2.  Areas of relative hypodensity are seen in the white matter of the  cerebral hemispheres. This is a nonspecific finding. It may be related  to chronic ischemic  change from small vessel disease.    Tana Felts, MD   06/23/2019 4:33 PM    Chest AP Portable [[id number]] Collected:  06/23/19 1623    Order Status:  Completed Updated:  06/23/19 1625    Narrative:       INDICATION: Chest pain of uncertain etiology    COMPARISON: 06/16/2019    FINDINGS:  A single radiograph of the chest performed. Portable film. A  battery overlies the left chest.  The heart is normal in size.   The mediastinal and hilar structures are within normal limits.  The lung fields are clear. No pleural effusion.  No pneumothorax.    The  visualized osseous structures demonstrates no acute abnormality.      Impression:        No active disease    Kinnie Feil, MD   06/23/2019 4:23 PM  Procedures    Clinical Impression & Disposition     Clinical Impression  Final diagnoses:   Chest pain, unspecified type   Acute nonintractable headache, unspecified headache type        ED Disposition     ED Disposition Condition Date/Time Comment    Discharge  Sun Jun 23, 2019  4:58 PM [name] discharge to home/self care.    Condition at disposition: Stable           New Prescriptions    No medications on file                 Genia Hotter, MD  06/23/19 [phone]

## 2019-06-23 NOTE — Discharge Instructions (Signed)
Headache    You have been treated for a headache.    Headaches are very common. Most of the time they are benign (not harmful). Some headaches can be very serious. Your headache appears to be benign. The doctor feels it is OK for you to go home.    If you continue to have headaches, or if this headache does not go away over the next few days, you should be evaluated by your regular doctor or a neurologist. Keeping a "headache diary" may help your doctor learn the cause of your headaches.    When you get a headache, write down:   What happens before your headache starts - Where you were, what you were doing, if you ate anything, and so on.   Where your pain is.   What kind-of pain you have - Sharp, aching, throbbing, burning.    What helps your headache get better.    Take your headache medication as directed. This is very important if your doctor has placed you on a daily medication to prevent headaches.    Return here or go to the nearest Emergency Department immediately if:   Your headache gets worse.   You have a severe headache that starts suddenly.   Your head pain is different from your normal headache.   You have a fever (temperature higher than 100.4F / 38C), especially with a stiff neck.   You feel numbness, tingling, or weakness in your arms or legs.   You pass out.   You have problems with your vision.   You vomit (throw up) and have trouble taking medication or keeping it down.    If you can't follow up with your doctor, or if at any time you feel you need to be rechecked or seen again, come back here or go to the nearest emergency department.               Chest Pain of Unclear Etiology    You have been seen for chest pain. The cause of your pain is not yet known.    Your doctor has learned about your medical history, examined you, and checked any tests that were done. Still, it is not clear why you are having pain. The doctor thinks there is only a small chance that your pain is  caused by a health problem that could lead to serious harm or death. Later, your primary care doctor might do more tests or check you again.    Sometimes chest pain is caused by a health problem that can lead to death, like a:   Heart attack.   Injury to the large blood vessel in your body (aorta).   Blood clot in the lung.   Collapsed lung.     It is not likely that your pain is caused by a health problem that could lead to death if:    Your chest pain lasts only a few seconds at a time   You are not short of breath, nauseated (sick to your stomach), sweaty, or lightheaded   Your pain gets worse when you twist or bend   Your pain improves with exercise or hard work.    Chest pain is serious. It is very important that you follow up with your regular doctor.    Return here or go to the nearest Emergency Department immediately if:   Your pain makes you short of breath, nauseated (sick to your stomach), or sweaty.   Your pain gets   worse when you walk, go up stairs, or exert yourself.   You feel weak, lightheaded, or faint.   It hurts to breathe.   Your leg swells.   Your pain or symptoms get worse    You have new symptoms or concerns.    If you can't follow up with your doctor, or if at any time you feel you need to be rechecked or seen again, come back here or go to the nearest emergency department.

## 2019-06-23 NOTE — ED Notes (Signed)
MD at bedside. 

## 2019-06-23 NOTE — EDIE (Signed)
COLLECTIVE?NOTIFICATION?06/23/2019 15:00?[name], [name]?MRN: [id number]    [facility] [facility]'s patient encounter information:   VWU:[id number]  Account [id number]  Billing Account [id number]      Criteria Met      3 Different Facilities in 90 Days    5 ED Visits in 12 Months    Security and Safety  No recent Security Events currently on file    ED Care Guidelines  There are currently no ED Care Guidelines for this patient. Please check your facility's medical records system.        Prescription Monitoring Program  481??- Narcotic Use Score  401??- Sedative Use Score  000??- Stimulant Use Score  280??- Overdose Risk Score  - All Scores range from 000-999 with 75% of the population scoring < 200 and on 1% scoring above 650  - The last digit of the narcotic, sedative, and stimulant score indicates the number of active prescriptions of that type  - Higher Use scores correlate with increased prescribers, pharmacies, mg equiv, and overlapping prescriptions  - Higher Overdose Risk Scores correlate with increased risk of unintentional overdose death   Concerning or unexpectedly high scores should prompt a review of the PMP record; this does not constitute checking PMP for prescribing purposes.      E.D. Visit Count (12 mo.)  Facility Visits   [facility] [address] [facility] 4   [facility] 6   [facility] 1   [place] [facility] 1   [facility] 2   Total 61   Note: Visits indicate total known visits.      Recent Emergency Department Visit Summary  Showing 10 most recent visits out of 61 in the past 12 months  Date Facility [facility] Type Diagnoses or Chief Complaint   Jun 23, 2019 [place] [name] [place] [facility]      Chest Pain      Jun 23, 2019 [facility] [place] [facility] [place] [facility]      CHEST PAIN, NO TRAVEL, NO EXP      Chest pain, unspecified      Jun 20, 2019 [facility] [name] [facility] [place] [facility]      ALLERGIC REACTION, SEIZURE      ALLERGIC REACTION      Allergy, unspecified, initial encounter      Jun 16, 2019 [place] [name] [place] [facility]      chest pain      chest pain, head pain,      Chest Pain      Headache      Weakness      Jun 15, 2019 [facility] [place] [name] [place] [facility]      FALLING HEADACHE VOMITING NO TRAVEL      FALL      Unspecified injury of head, initial encounter      Personal history of transient ischemic attack (TIA), and cere      1. Headache      2. Cervicalgia      4. Strain of muscle, fascia and tendon at neck level, initial en      5. Other migraine, not intractable, without status migrainosus      6. Unspecified fall, initial encounter      7. Personal history of other diseases of the circulatory system  Jun 05, 2019 [facility] [place] [facility] [place] [facility]      LEFT SIDE DROOP, HEADACHE,      FACIAL DROOP      Weakness      Jun 01, 2019 [facility] [place] [name] [facility]      CHEST PAIN SYNCOPE NO TRAVEL      CHEST PAIN ADULT DIZZINESS      2. Dizziness and giddiness      3. Chest pain, unspecified      4. Personal history of transient ischemic attack (TIA), and cere      5. Essential (primary) hypertension      6. Bipolar disorder, unspecified      7. Pruritus, unspecified      9. Nausea      10. Unspecified abdominal pain      May 30, 2019 [facility] [place] [name] [facility]      HEAD PAIN, VOMITING, BODY ACHES      HEADACHE NEW ONSET OR NEW SYMPTOMS DIZZINESS      HEADACHE NEW ONSET OR NEW SYMPTOMS DI      1. Headache      2. Migraine without aura, not intractable, without status migrai      3. Bipolar disorder, unspecified      4. Chronic obstructive pulmonary disease, unspecified      5. Personal history of transient ischemic attack (TIA), and cere      6. Essential (primary) hypertension      7. Paroxysmal atrial fibrillation      May 29, 2019 [facility] [place] [name] [place] [facility]      NAUSEA/TROUBLE WALKING      HEADACHE NEW ONSET OR NEW SYMPTOMS      1. Headache      3. Other chronic pain      4. Bipolar disorder, unspecified      5. Chronic obstructive pulmonary disease, unspecified      6. Essential (primary) hypertension      7. Personal history of transient ischemic attack (TIA), and cere      8. Type 2 diabetes mellitus without complications      9. Family history of ischemic heart disease and other diseases o      May 20, 2019 [place] [name] [place] [facility]      stroke symptomn      Facial Droop      Weakness          Recent Inpatient Visit Summary  Date Facility [facility] Type Diagnoses or Chief Complaint   Jun 05, 2019 [place] [facility] [name] [facility]      FACIAL DROOP      Weakness      Feb 14, 2019 [facility] [place] [name]  [facility]      LEFT SIDEED WEAKNESS      LEFT SIDEED WEAKNESS TIA TRANSIENT ISCHEMIC ATTACK      1. Weakness      2. Transient cerebral ischemic attack, unspecified      3. Migraine with aura, not intractable, without status migrainos      4. Chronic obstructive pulmonary disease, unspecified      5. Paroxysmal atrial fibrillation      6. Type 2 diabetes mellitus without complications      7. Bipolar disorder, unspecified      8. Essential (primary) hypertension      Jan 05, 2019 [place] [facility] [place] [name]      Acute sialoadenitis      Chest pain, unspecified      Torticollis      Dec 18, 2018 [facility] [place] [name] [place] [facility]      RECTAL BLEEDING      Anemia, unspecified      Melena      1. Hemorrhage of anus and rectum      2. Diverticulosis of large intestine without perforation or absc      3. Acute posthemorrhagic anemia      4. Epilepsy, unspecified, not intractable, without status epilep      5. Paroxysmal atrial fibrillation      6. Bipolar disorder, unspecified      7. Hyperlipidemia, unspecified      Nov 21, 2018 [facility] [place] [name] [facility]      ACUTE CHEST PAIN      1. Chest pain, unspecified      2. Other chest pain      3. Chronic diastolic (congestive) heart failure      4. Chronic obstructive pulmonary disease, unspecified      5. Hypertensive heart disease with heart failure      6. Bipolar disorder, unspecified      7. Type 2 diabetes mellitus without complications      8. 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 [name] [facility]      WEAKNESS OF LEFT UPPER EXTREMITY      Other symptoms and signs involving the musculoskeletal system      Slurred speech      WEAKNESS OF LEFT UPPER EXTREMITY PROGRESSIVE FOCAL MOTO      Jul 08, 2018 [facility] [place] [name] [facility]      SYNCOPE      Dizziness and giddiness      SYNCOPE SLURRED SPEECH      3. Paroxysmal atrial fibrillation      4. Slurred speech      4. Syncope and collapse      5. Essential (primary) hypertension      6. Gastro-esophageal reflux disease without esophagitis      7. Type 2 diabetes mellitus without complications      8. Anxiety disorder, unspecified          Care Team  There is not a care team on record at this time.   Collective Portal  This patient has registered at the [facility] Emergency Department   For more information visit: [email or web address]     PLEASE NOTE:     1.   Any care recommendations and other clinical information are provided as guidelines or for historical purposes only, and providers should exercise their own clinical judgment when providing care.    2.   You may only use this information for purposes of treatment, payment or health care operations activities, and subject to the limitations of applicable Collective Policies.    3.   You should consult  directly with the organization that provided a care guideline or other clinical history with any questions about additional information or accuracy or completeness of information provided.    ? 2020 [place] - [email or web address]

## 2019-06-24 LAB — ECG 12-LEAD
Atrial Rate: 75 {beats}/min
P Axis: 37 degrees
P-R Interval: 156 ms
Q-T Interval: 398 ms
QRS Duration: 84 ms
QTC Calculation (Bezet): 444 ms
R Axis: 28 degrees
T Axis: 32 degrees
Ventricular Rate: 75 {beats}/min

## 2020-05-27 ENCOUNTER — Ambulatory Visit: Payer: Self-pay

## 2020-05-27 ENCOUNTER — Encounter (FREE_STANDING_LABORATORY_FACILITY): Payer: Medicare Other

## 2020-05-27 DIAGNOSIS — G5620 Lesion of ulnar nerve, unspecified upper limb: Secondary | ICD-10-CM

## 2020-05-27 LAB — SURGICAL PATHOLOGY EXAM

## 2020-06-01 LAB — LAB USE ONLY - HISTORICAL SURGICAL PATHOLOGY

## 2020-07-07 ENCOUNTER — Encounter (INDEPENDENT_AMBULATORY_CARE_PROVIDER_SITE_OTHER): Payer: Self-pay | Admitting: Family

## 2020-07-07 ENCOUNTER — Encounter (INDEPENDENT_AMBULATORY_CARE_PROVIDER_SITE_OTHER): Payer: Medicare Other | Admitting: Family

## 2020-07-07 NOTE — Progress Notes (Signed)
This encounter was created in error - please disregard.    Items noted as "reviewed" are for administrative purposes only and are not guaranteed by the provider to be accurate on this date.

## 2020-07-09 ENCOUNTER — Ambulatory Visit (INDEPENDENT_AMBULATORY_CARE_PROVIDER_SITE_OTHER): Payer: Medicare Other | Admitting: Family

## 2020-07-12 ENCOUNTER — Encounter (INDEPENDENT_AMBULATORY_CARE_PROVIDER_SITE_OTHER): Payer: Self-pay

## 2020-07-13 ENCOUNTER — Ambulatory Visit (INDEPENDENT_AMBULATORY_CARE_PROVIDER_SITE_OTHER): Payer: Medicare Other | Admitting: Family

## 2020-09-03 ENCOUNTER — Ambulatory Visit (INDEPENDENT_AMBULATORY_CARE_PROVIDER_SITE_OTHER): Payer: Medicare Other | Admitting: Family

## 2020-09-09 ENCOUNTER — Emergency Department
Admission: EM | Admit: 2020-09-09 | Discharge: 2020-09-10 | Disposition: A | Discharge status: Home / Self Care | Payer: Medicare Other | Attending: Emergency Medicine | Admitting: Emergency Medicine

## 2020-09-09 DIAGNOSIS — R519 Headache, unspecified: Secondary | ICD-10-CM | POA: Insufficient documentation

## 2020-09-09 DIAGNOSIS — I639 Cerebral infarction, unspecified: Secondary | ICD-10-CM

## 2020-09-09 DIAGNOSIS — R29810 Facial weakness: Secondary | ICD-10-CM

## 2020-09-09 DIAGNOSIS — G8929 Other chronic pain: Secondary | ICD-10-CM | POA: Insufficient documentation

## 2020-09-09 LAB — URINALYSIS REFLEX TO MICROSCOPIC EXAM - REFLEX TO CULTURE
Bilirubin, UA: NEGATIVE
Glucose, UA: NEGATIVE
Ketones UA: NEGATIVE
Nitrite, UA: NEGATIVE
Protein, UR: NEGATIVE
Specific Gravity UA: 1.009 (ref 1.001–1.035)
Urine pH: 6 (ref 5.0–8.0)
Urobilinogen, UA: NEGATIVE mg/dL (ref 0.2–2.0)

## 2020-09-09 LAB — PHOSPHORUS: Phosphorus: 3.5 mg/dL (ref 2.3–4.7)

## 2020-09-09 LAB — HEPATIC FUNCTION PANEL
ALT: 14 U/L (ref 0–55)
AST (SGOT): 15 U/L (ref 5–34)
Albumin/Globulin Ratio: 1.1 (ref 0.9–2.2)
Albumin: 3.6 g/dL (ref 3.5–5.0)
Alkaline Phosphatase: 80 U/L (ref 37–106)
Bilirubin Direct: 0.1 mg/dL (ref 0.0–0.5)
Bilirubin Indirect: 0.2 mg/dL (ref 0.2–1.0)
Bilirubin, Total: 0.3 mg/dL (ref 0.2–1.2)
Globulin: 3.3 g/dL (ref 2.0–3.6)
Protein, Total: 6.9 g/dL (ref 6.0–8.3)

## 2020-09-09 LAB — MAGNESIUM: Magnesium: 1.7 mg/dL (ref 1.6–2.6)

## 2020-09-09 LAB — BASIC METABOLIC PANEL
Anion Gap: 15 (ref 5.0–15.0)
BUN: 9 mg/dL (ref 7–19)
CO2: 22 mEq/L (ref 22–29)
Calcium: 9.1 mg/dL (ref 8.5–10.5)
Chloride: 103 mEq/L (ref 100–111)
Creatinine: 1 mg/dL (ref 0.6–1.0)
Glucose: 89 mg/dL (ref 70–100)
Potassium: 3.5 mEq/L (ref 3.5–5.1)
Sodium: 140 mEq/L (ref 136–145)

## 2020-09-09 LAB — GLUCOSE WHOLE BLOOD - POCT: Whole Blood Glucose POCT: 94 mg/dL (ref 70–100)

## 2020-09-09 LAB — GFR: EGFR: >60

## 2020-09-09 MED ORDER — SODIUM CHLORIDE 0.9 % IV MBP
INTRAVENOUS | Status: AC
Start: 2020-09-09 — End: 2020-09-09
  Filled 2020-09-09: qty 100

## 2020-09-09 MED ORDER — DIPHENHYDRAMINE HCL 50 MG/ML IJ SOLN
25.0000 mg | Freq: Once | INTRAMUSCULAR | Status: AC
Start: 2020-09-09 — End: 2020-09-09
  Administered 2020-09-09: 25 mg via INTRAVENOUS
  Filled 2020-09-09: qty 1

## 2020-09-09 MED ORDER — ACETAMINOPHEN 500 MG PO TABS
1000.0000 mg | ORAL_TABLET | Freq: Once | ORAL | Status: DC
Start: 2020-09-09 — End: 2020-09-09
  Filled 2020-09-09: qty 2

## 2020-09-09 MED ORDER — PROCHLORPERAZINE EDISYLATE 10 MG/2ML IJ SOLN
10.0000 mg | Freq: Once | INTRAMUSCULAR | Status: AC
Start: 2020-09-09 — End: 2020-09-09
  Administered 2020-09-09: 10 mg via INTRAVENOUS
  Filled 2020-09-09: qty 2

## 2020-09-09 NOTE — ED Notes (Signed)
'  Code Neuro' called at pt arrival.

## 2020-09-09 NOTE — ED Notes (Signed)
Neuro alert called. Orders given by [place].

## 2020-09-09 NOTE — ED Provider Notes (Signed)
[place] EMERGENCY DEPARTMENT H&P       Visit date: 09/09/2020      CLINICAL SUMMARY          Diagnosis:    .     Final diagnoses:   Chronic nonintractable headache, unspecified headache type   Facial droop         MDM Notes:          Differential Diagnosis considered by MD (not completely inclusive):  CVA, TIA, intracranial hemorrhage, Peripheral Neuropathy, Cranial Nerve Palsy, demyelinating disease, mass, tumor, psychosis, acute schizophrenia, dementia, delusions, delerium, suicidal ideation, homicidal ideation    Each of the differential diagnosis has been considered for diagnosis and weighed risk and benefit of further testing and evaluation in the context of patient complaint. Some diagnosis do not warrant further testing including imaging and/or labs. However, all differential diagnosis have been considered.    Vital Signs: Reviewed the patient's vital signs.   Nursing Notes: Reviewed and utilized available nursing notes.  Medical Records Reviewed: Reviewed available past medical records.  Counseling: The emergency provider has spoken with the patient and discussed today's findings, in addition to providing specific details for the plan of care.  Questions are answered and there is agreement with the plan.    If I counseled patient face to face for greater than 3 minutes but less than 9 to stop the use of  tobacco and/or electronic nicotine delivery system and provided cessation strategies and discussed risks: N/A    9:59 PM  Discussed with Dr. Moise Boring, patient is outside of any intervention. MRI shows no acute infarct can be d/c home. MRI earlier today from [facility]    Pt requested morphine for headache. Pt had high narcotic score and chronic pain physician. Findings less likely CVA/TIA given normal MRI. PT declined head CT due to concerns for excessive radiation. Pt has had many CT scans in the past 12 months.         Disposition:      Disposition:  ED Disposition     ED Disposition  Condition Date/Time Comment    Discharge Boarder to Home  Thu Sep 10, 2020 12:32 AM           Prescriptions:  Patient's Medications   New Prescriptions    No medications on file   Previous Medications    ACETAMINOPHEN (TYLENOL) 325 MG TABLET    Take 2 tablets (650 mg total) by mouth every 4 (four) hours as needed for Pain    ALBUTEROL (PROVENTIL HFA) 108 (90 BASE) MCG/ACT INHALER    Inhale 1 puff into the lungs daily Also take it PRN every 20 minutes.    AMLODIPINE (NORVASC) 2.5 MG TABLET    Take 1 tablet (2.5 mg total) by mouth daily    APIXABAN (ELIQUIS) 5 MG    Take 1 tablet (5 mg total) by mouth every 12 (twelve) hours    ATORVASTATIN (LIPITOR) 40 MG TABLET    Take 40 mg by mouth daily    BUDESONIDE-FORMOTEROL (SYMBICORT) 80-4.5 MCG/ACT INHALER    Inhale 2 puffs into the lungs daily    FUROSEMIDE (LASIX) 20 MG TABLET    Take 1 tablet (20 mg total) by mouth daily    LAMOTRIGINE (LAMICTAL) 150 MG TABLET    Take 1 tablet (150 mg total) by mouth 2 (two) times daily    LORAZEPAM (ATIVAN) 0.5 MG TABLET    Take 0.5 mg by mouth daily as needed for Anxiety    LURASIDONE  HCL (LATUDA) 40 MG TAB    Take 40 mg by mouth nightly    METOPROLOL SUCCINATE XL (TOPROL-XL) 25 MG 24 HR TABLET    Take 25 mg by mouth daily    PANTOPRAZOLE (PROTONIX) 40 MG TABLET    Take 1 tablet (40 mg total) by mouth daily    QUETIAPINE (SEROQUEL) 50 MG TABLET    Take 1 tablet (50 mg total) by mouth nightly    ROPINIROLE (REQUIP) 1 MG TABLET    Take 1 tablet (1 mg total) by mouth nightly    TERAZOSIN (HYTRIN) 2 MG CAPSULE    Take 1 capsule (2 mg total) by mouth nightly   Modified Medications    No medications on file   Discontinued Medications    No medications on file                   CLINICAL INFORMATION        HPI:      Chief Complaint: No chief complaint on file.  [name] is a 64 y.o. female who presents with left sided numbness x 4 days and facial droop x 4 days and worsening speech since yesterday. Pt was seen at [facility] on 11/13 for  left arm and left leg numbness and facial droop. Pt had MRI without contrast today that was normal. Patient has had many CT scans (16 scans in 12 months) and told by her PMD not to have any more CT scans. Per notes from [place], patient eloped from the hospital on 11/13 and went back today. Pt had MRI today and [facility]. Pt states she was discharged and after going home she had worsening speech and facial asymmetry so came to the ER at [facility].      1. What was patient doing when symptoms started (Context): see above  2. Duration: ongoing since timing above  3. Location: neuro  4. Severity: severe  5. Activities that worsen symptoms: n/a  6. Activities that improve symptoms: n/a  7. Quality: N/A  8. Radiation of symptoms: N/A  9. Associated signs and Symptoms: see above  10. Are symptoms worsening? yes    History obtained from: Patient          ROS:      Caveat: Unable to complete ROS due critical condition of patient      Physical Exam:      Pulse    BP    Resp    SpO2    Temp      Constitutional: Vital signs reviewed. Well appearing. NAD  Head: Normocephalic, atraumatic  Eyes: No conjunctival injection. No discharge. PERRL, EOMI  ENT: Mucous membranes moist, clear oropharynx  Neck: Normal range of motion. Trachea midline.  Respiratory/Chest: clear to auscultation. No wheezes, rales or rhonchi. Good air movement.  No dyspnea or work of breathing.  Cardiovascular: Regular rate and rhythm. No murmur. No rubs, murmurs, gallops  Abdomen: soft and nontender in all quadrants. No guarding or rebound. No masses or hepatosplenomegaly..  Back: no CVA tenderness, No spinal tenderness  Upper Extremities:No edema. No cyanosis. No deformity.  Lower Extremities: No edema. No cyanosis. No deformity.  Neurological: Alert Normal and symmetric motor tone by observation. Speech normal. Memory Normal. +right facial droop, subjective numbness RUE and RLE strength 5/5 in UE/LE b/l  Skin: Warm and dry. No rash.  Psychiatric:  Normal affect. Normal concentration.  PAST HISTORY        Primary Care Provider: Malcolm Metro, MD        PMH/PSH:    .     Past Medical History:   Diagnosis Date   . Anemia    . Asthma    . Atrial fibrillation    . Chronic obstructive pulmonary disease    . Convulsions    . Depression    . Diabetes mellitus     diet controlled    . Hyperlipidemia    . Hypertension    . TIA (transient ischemic attack)        She has a past surgical history that includes Hysterectomy; pci; and Cardiac catheterization (2017).          Social/Family History:      She reports that she has never smoked. She has never used smokeless tobacco. She reports that she does not drink alcohol and does not use drugs.    No family history on file.      Listed Medications on Arrival:    .     Home Medications             acetaminophen (TYLENOL) 325 MG tablet     Take 2 tablets (650 mg total) by mouth every 4 (four) hours as needed for Pain     albuterol (PROVENTIL HFA) 108 (90 Base) MCG/ACT inhaler     Inhale 1 puff into the lungs daily Also take it PRN every 20 minutes.     amLODIPine (NORVASC) 2.5 MG tablet     Take 1 tablet (2.5 mg total) by mouth daily     apixaban (ELIQUIS) 5 MG     Take 1 tablet (5 mg total) by mouth every 12 (twelve) hours     atorvastatin (LIPITOR) 40 MG tablet     Take 40 mg by mouth daily     budesonide-formoterol (SYMBICORT) 80-4.5 MCG/ACT inhaler     Inhale 2 puffs into the lungs daily     furosemide (LASIX) 20 MG tablet     Take 1 tablet (20 mg total) by mouth daily     Patient taking differently: Take 20 mg by mouth 2 (two) times daily Patient states she takes lasix every other day       lamoTRIgine (LAMICTAL) 150 MG tablet     Take 1 tablet (150 mg total) by mouth 2 (two) times daily     Patient taking differently: Take 150 mg by mouth daily        LORazepam (Ativan) 0.5 MG tablet     Take 0.5 mg by mouth daily as needed for Anxiety     Lurasidone HCl (Latuda) 40 MG Tab     Take 40 mg by mouth  nightly     metoprolol succinate XL (TOPROL-XL) 25 MG 24 hr tablet     Take 25 mg by mouth daily     pantoprazole (PROTONIX) 40 MG tablet     Take 1 tablet (40 mg total) by mouth daily     QUEtiapine (SEROQUEL) 50 MG tablet     Take 1 tablet (50 mg total) by mouth nightly     rOPINIRole (REQUIP) 1 MG tablet     Take 1 tablet (1 mg total) by mouth nightly     terazosin (HYTRIN) 2 MG capsule     Take 1 capsule (2 mg total) by mouth nightly         Allergies: She is allergic  to singulair [montelukast], myoview [technetium-45m], contrast [iodinated diagnostic agents], latex, nitroglycerin, penicillins, and tetracyclines & related.            VISIT INFORMATION        Clinical Course in the ED:             Medications Given in the ED:    .     ED Medication Orders (From admission, onward)    None            Procedures:      Procedures         Medical Decision Making - Interpretations (DATA):          EKG was Reviewed, Analyzed and Interpreted by me: Yes: NSR rate of 78, normal axis, no st-changes, t-wave inversions V1-V3    Rhythm Strip Interpretation / Cardiac Monitor Analysis: Yes: NSR rate of 75    Pulse Ox Analysis: Yes: saturation: 99 %; Oxygen use: room air; Interpretation: Normal      Laboratory results reviewed and interpreted by me: Yes  Review and/or order of medical tests: Yes      Discuss tests with performing physician: N/A  Discuss the patient with other providers: N/A  Reviewed and summarized old records or history from a person other than the patient: N/A       All tests, tracings, EKGs, vital signs and radiological studies above where applicable were interpreted independently by me, Gracelyn Nurse MD.                RESULTS        Lab Results:      Results     ** No results found for the last 24 hours. **              Radiology Results:      No orders to display

## 2020-09-09 NOTE — EDIE (Signed)
COLLECTIVE?NOTIFICATION?09/09/2020 21:12?[name], [name]?MRN: [id number]    [facility]'s patient encounter information:   IEP:?[id number]  Account [id number]  Billing Account [id number]      Criteria Met      5 ED Visits in 12 Months    3 Different Facilities in 90 Days    Security and Safety  No recent Security Events currently on file    ED Care Guidelines  There are currently no ED Care Guidelines for this patient. Please check your facility's medical records system.    Flags      Negative COVID-19 Lab Result - [facility] - A specimen collected from this patient was negative for COVID-19 / Attributed By: [place] Department [facility] / Attributed On: 08/14/2020       Prescription Monitoring Program  000??- Narcotic Use Score  000??- Sedative Use Score  000??- Stimulant Use Score  000??- Overdose Risk Score  - All Scores range from 000-999 with 75% of the population scoring < 200 and on 1% scoring above 650  - The last digit of the narcotic, sedative, and stimulant score indicates the number of active prescriptions of that type  - Higher Use scores correlate with increased prescribers, pharmacies, mg equiv, and overlapping prescriptions  - Higher Overdose Risk Scores correlate with increased risk of unintentional overdose death   Concerning or unexpectedly high scores should prompt a review of the PMP record; this does not constitute checking PMP for prescribing purposes.      E.D. Visit Count (12 mo.)  Facility Visits   [facility] 21   [facility] 1   [facility] 11   Total 16   Note: Visits indicate total known visits.     Recent Emergency Department Visit Summary  Showing 10 most recent visits out of 33 in the past 12 months  Date Facility [facility] Type Diagnoses or Chief Complaint   Sep 09, 2020 [facility] [name] [place] [facility]      Left arm Numbess, Right side facial droop, headache; Off speech      Sep 09, 2020  [facility] [place] [name] [facility]      Other chest pain      Anesthesia of skin      Sep 05, 2020 [facility] [place] [facility] [place] [facility]      Dizzness, Tingling, patient thinks she is experiencing a stroke      Anesthesia of skin      Headache, unspecified      Aug 11, 2020 [facility] [place] [facility] [place] [facility]      LEFT SIDE ABDOMINAL PAIN; COUGH      Cough, unspecified      Fever, unspecified      Strain of adductor muscle, fascia and tendon of left thigh, initial encounter      1. Left lower quadrant pain      1. Strain of muscle, fascia and tendon of abdomen, initial encounter      4. Contact with and (suspected) exposure to covid-19      5. Bipolar disorder, unspecified      6. Essential (primary) hypertension      7. Chronic obstructive pulmonary disease, unspecified      Jul 20, 2020 [facility] [place] [facility] [place] [facility]      chest pain      Chest pain, unspecified  2. Nausea      2. Chronic obstructive pulmonary disease, unspecified      3. Other symptoms and signs involving the nervous system      3. Essential (primary) hypertension      4. Type 2 diabetes mellitus without complications      5. Personal history of transient ischemic attack (TIA), and cerebral infarction without residual deficits      6. Other long term (current) drug therapy      7. Long term (current) use of anticoagulants      Jul 07, 2020 [facility] [place] [facility] [place] [facility]      chest pain      Chest pain, unspecified      Anesthesia of skin      1. Precordial pain      2. Headache, unspecified      3. Other specified soft tissue disorders      4. Other chronic pain      5. Other specified postprocedural states      6. Chronic obstructive pulmonary disease, unspecified      7. Essential (primary) hypertension      Jul 06, 2020 [facility] [place] [name] [facility]      RS/CHEST DISCOMFORT      1. Chest pain, unspecified      2. Procedure and treatment not carried out due to patient  leaving prior to being seen by health care provider      Jun 18, 2020 [facility] [place] [facility] [place] [facility]      RECTAL BLEEDING, MIGRAINE      Other fecal abnormalities      Unspecified abdominal pain      1. Hemorrhage of anus and rectum      1. Melena      2. Right lower quadrant pain      3. Left lower quadrant pain      4. Dizziness and giddiness      5. Long term (current) use of anticoagulants      6. Essential (primary) hypertension      Jun 08, 2020 [facility] [place] [facility] [place] [facility]      NECK PAIN, SHOULDER PAIN      1. Cervicalgia      1. Strain of other muscles, fascia and tendons at shoulder and upper arm level, left arm, initial encounter      2. Other specified postprocedural states      2. Pain in left upper arm      3. Chronic obstructive pulmonary disease, unspecified      4. Essential (primary) hypertension      5. Type 2 diabetes mellitus without complications      6. Paroxysmal atrial fibrillation      7. Hyperlipidemia, unspecified      May 16, 2020 [facility] [place] [name] [facility]      SEIZURES      Unspecified convulsions      1. Headache, unspecified      3. Essential (primary) hypertension      4. Paroxysmal atrial fibrillation      5. Chronic obstructive pulmonary disease, unspecified      6. Bipolar disorder, unspecified      7. Hyperlipidemia, unspecified      8. Type 2 diabetes mellitus without complications      9. Long term (current) use of anticoagulants  Recent Inpatient Visit Summary  Date Facility [facility] Type Diagnoses or Chief Complaint   Apr 15, 2020 [facility] [name] [facility]      1. Chest pain, unspecified      1. Other chest pain      2. Paroxysmal atrial fibrillation      3. Essential (primary) hypertension      4. Hyperlipidemia, unspecified      5. Type 2 diabetes mellitus without complications      6. Anemia, unspecified      7. Bipolar disorder, unspecified      8. Chronic obstructive pulmonary  disease, unspecified      9. Gastro-esophageal reflux disease without esophagitis      Mar 19, 2020 [facility] [place] [name] [place]      Gastrointestinal hemorrhage, unspecified      Long term (current) use of anticoagulants      Mar 02, 2020 [facility] [place] [name]  [facility]      Cerebral infarction, unspecified      Jan 10, 2020 [facility] [place] [name]  In[facility]      LEFT SIDED NUMBNESS      Anesthesia of skin      Headache, unspecified      HEADACHE RECURRENT OR KNOWN DX MIGRAINE      3. Personal history of transient ischemic attack (TIA), and cerebral infarction without residual deficits      4. Essential (primary) hypertension      5. Hyperlipidemia, unspecified      6. Type 2 diabetes mellitus without complications      7. Chronic obstructive pulmonary disease, unspecified      8. Gastro-esophageal reflux disease without esophagitis          Care Team  There is not a care team on record at this time.   Collective Portal  This patient has registered at the [facility] Emergency Department   For more information visit: [email or web address]     PLEASE NOTE:     1.   Any care recommendations and other clinical information are provided as guidelines or for historical purposes only, and providers should exercise their own clinical judgment when providing care.    2.   You may only use this information for purposes of treatment, payment or health care operations activities, and subject to the limitations of applicable Collective Policies.    3.   You should consult directly with the organization that provided a care guideline or other clinical history with any questions about additional information or accuracy or completeness of information provided.    ? 2021 [place], [employer] - [email or web address]

## 2020-09-10 LAB — CBC AND DIFFERENTIAL
Absolute NRBC: 0 10*3/uL (ref 0.00–0.00)
Basophils Absolute Automated: 0.04 10*3/uL (ref 0.00–0.08)
Basophils Automated: 0.6 %
Eosinophils Absolute Automated: 0.41 10*3/uL (ref 0.00–0.44)
Eosinophils Automated: 5.9 %
Hematocrit: 35.5 % (ref 34.7–43.7)
Hgb: 11.1 g/dL — ABNORMAL LOW (ref 11.4–14.8)
Immature Granulocytes Absolute: 0.03 10*3/uL (ref 0.00–0.07)
Immature Granulocytes: 0.4 %
Lymphocytes Absolute Automated: 2.13 10*3/uL (ref 0.42–3.22)
Lymphocytes Automated: 30.6 %
MCH: 27 pg (ref 25.1–33.5)
MCHC: 31.3 g/dL — ABNORMAL LOW (ref 31.5–35.8)
MCV: 86.4 fL (ref 78.0–96.0)
MPV: 10.7 fL (ref 8.9–12.5)
Monocytes Absolute Automated: 0.52 10*3/uL (ref 0.21–0.85)
Monocytes: 7.5 %
Neutrophils Absolute: 3.84 10*3/uL (ref 1.10–6.33)
Neutrophils: 55 %
Nucleated RBC: 0 /100 WBC (ref 0.0–0.0)
Platelets: 200 10*3/uL (ref 142–346)
RBC: 4.11 10*6/uL (ref 3.90–5.10)
RDW: 13 % (ref 11–15)
WBC: 6.97 10*3/uL (ref 3.10–9.50)

## 2020-09-10 LAB — TROPONIN I: Troponin I: <0.01 ng/mL (ref 0.00–0.05)

## 2020-09-10 MED ORDER — BUTALBITAL-APAP-CAFFEINE 50-325-40 MG PO TABS
1.0000 | ORAL_TABLET | Freq: Four times a day (QID) | ORAL | 0 refills | Status: DC | PRN
Start: 2020-09-10 — End: 2022-01-26

## 2020-09-10 NOTE — Discharge Instructions (Signed)
Headache    You have been treated for a headache.    Headaches are very common. Most of the time they are benign (not harmful). Some headaches can be very serious. Your headache appears to be benign. The doctor feels it is OK for you to go home.    If you continue to have headaches, or if this headache does not go away over the next few days, you should be evaluated by your regular doctor or a neurologist. Keeping a "headache diary" may help your doctor learn the cause of your headaches.    When you get a headache, write down:   What happens before your headache starts - Where you were, what you were doing, if you ate anything, and so on.   Where your pain is.   What kind-of pain you have - Sharp, aching, throbbing, burning.    What helps your headache get better.    Take your headache medication as directed. This is very important if your doctor has placed you on a daily medication to prevent headaches.    Return here or go to the nearest Emergency Department immediately if:   Your headache gets worse.   You have a severe headache that starts suddenly.   Your head pain is different from your normal headache.   You have a fever (temperature higher than 100.4F / 38C), especially with a stiff neck.   You feel numbness, tingling, or weakness in your arms or legs.   You pass out.   You have problems with your vision.   You vomit (throw up) and have trouble taking medication or keeping it down.    If you can't follow up with your doctor, or if at any time you feel you need to be rechecked or seen again, come back here or go to the nearest emergency department.               Paresthesias    You have been seen for paresthesias.    Paresthesia is an abnormal sensation (feeling) in any part of the body. The paresthesia itself has no long-term bad effects. People often describe it as tingling, numbness, burning, or pricking of the skin. Many say it feels like "pins and needles," or like the body part is  asleep.    Paresthesias can be a symptom of some illnesses. This means there are many things that can cause paresthesias. The paresthesias can be a sign of an underlying medical condition.     Some causes of paresthesias are:   Skin Problems: Irritation of skin by certain chemicals. Swelling of the skin from an injury. A burn or frostbite can feel like numbness.   Pressure on a nerve. This can happen when your arm "falls asleep" from lying on it too long. Carpal tunnel syndrome can do the same thing.   Hyperventilation (rapid or deep breathing).   Deficiency in some vitamins and minerals. This includes vitamins B1, B5, and B12.   Electrolyte problems.   Diabetic neuropathy (nerve disorders) from long-standing diabetes.   Problems with circulation.   Strokes.    You may have had some testing to help find out the cause of your paresthesias.    We still do not know the cause of your paresthesias. However, it is OK for you to go home. You may need more tests to figure out the cause.    See your primary care doctor or the referral specialist for more work-up and management of your   paresthesias.    YOU SHOULD SEEK MEDICAL ATTENTION IMMEDIATELY, EITHER HERE OR AT THE NEAREST EMERGENCY DEPARTMENT, IF ANY OF THE FOLLOWING OCCUR:   Your arms get weak, numb or paralyzed (can't move), especially on one side.   You have vision loss, trouble speaking or problems thinking.   Your speech is abnormal or one side of your face droops.   You lose consciousness ("pass out") or almost lose consciousness.   You have numbness or tingling after a head, neck or back injury.   You feel very dizzy or like the room is spinning.   You have other concerns.

## 2020-09-12 LAB — ECG 12-LEAD
Atrial Rate: 78 {beats}/min
P Axis: 26 degrees
P-R Interval: 168 ms
Q-T Interval: 426 ms
QRS Duration: 90 ms
QTC Calculation (Bezet): 485 ms
R Axis: 11 degrees
T Axis: 15 degrees
Ventricular Rate: 78 {beats}/min

## 2021-08-12 ENCOUNTER — Other Ambulatory Visit: Payer: Self-pay | Admitting: Medical

## 2021-12-24 ENCOUNTER — Emergency Department
Admission: EM | Admit: 2021-12-24 | Discharge: 2021-12-25 | Disposition: A | Discharge status: Other Acute Inpatient Hospital | Payer: 59 | Attending: Emergency Medicine | Admitting: Emergency Medicine

## 2021-12-24 ENCOUNTER — Emergency Department: Payer: 59

## 2021-12-24 DIAGNOSIS — R29898 Other symptoms and signs involving the musculoskeletal system: Secondary | ICD-10-CM

## 2021-12-24 DIAGNOSIS — R531 Weakness: Secondary | ICD-10-CM | POA: Insufficient documentation

## 2021-12-24 LAB — COMPREHENSIVE METABOLIC PANEL
ALT: 67 U/L — ABNORMAL HIGH (ref 0–55)
AST (SGOT): 35 U/L (ref 5–41)
Albumin/Globulin Ratio: 1 (ref 0.9–2.2)
Albumin: 3.6 g/dL (ref 3.5–5.0)
Alkaline Phosphatase: 68 U/L (ref 37–117)
Anion Gap: 19 — ABNORMAL HIGH (ref 5.0–15.0)
BUN: 8 mg/dL (ref 7.0–21.0)
Bilirubin, Total: 0.4 mg/dL (ref 0.2–1.2)
CO2: 22 mEq/L (ref 17–29)
Calcium: 10 mg/dL (ref 8.5–10.5)
Chloride: 97 mEq/L — ABNORMAL LOW (ref 99–111)
Creatinine: 0.6 mg/dL (ref 0.4–1.0)
Globulin: 3.5 g/dL (ref 2.0–3.6)
Glucose: 97 mg/dL (ref 70–100)
Potassium: 3.5 mEq/L (ref 3.5–5.3)
Protein, Total: 7.1 g/dL (ref 6.0–8.3)
Sodium: 138 mEq/L (ref 135–145)

## 2021-12-24 LAB — CBC AND DIFFERENTIAL
Absolute NRBC: 0 10*3/uL (ref 0.00–0.00)
Basophils Absolute Automated: 0.03 10*3/uL (ref 0.00–0.08)
Basophils Automated: 0.4 %
Eosinophils Absolute Automated: 0.01 10*3/uL (ref 0.00–0.44)
Eosinophils Automated: 0.1 %
Hematocrit: 29.9 % — ABNORMAL LOW (ref 34.7–43.7)
Hgb: 9.5 g/dL — ABNORMAL LOW (ref 11.4–14.8)
Immature Granulocytes Absolute: 0.07 10*3/uL (ref 0.00–0.07)
Immature Granulocytes: 0.8 %
Instrument Absolute Neutrophil Count: 5.93 10*3/uL (ref 1.10–6.33)
Lymphocytes Absolute Automated: 1.72 10*3/uL (ref 0.42–3.22)
Lymphocytes Automated: 20.5 %
MCH: 25.1 pg (ref 25.1–33.5)
MCHC: 31.8 g/dL (ref 31.5–35.8)
MCV: 79.1 fL (ref 78.0–96.0)
MPV: 8.2 fL — ABNORMAL LOW (ref 8.9–12.5)
Monocytes Absolute Automated: 0.62 10*3/uL (ref 0.21–0.85)
Monocytes: 7.4 %
Neutrophils Absolute: 5.93 10*3/uL (ref 1.10–6.33)
Neutrophils: 70.8 %
Nucleated RBC: 0 /100 WBC (ref 0.0–0.0)
Platelets: 456 10*3/uL — ABNORMAL HIGH (ref 142–346)
RBC: 3.78 10*6/uL — ABNORMAL LOW (ref 3.90–5.10)
RDW: 15 % (ref 11–15)
WBC: 8.38 10*3/uL (ref 3.10–9.50)

## 2021-12-24 LAB — ECG 12-LEAD
Atrial Rate: 119 {beats}/min
Atrial Rate: 119 {beats}/min
P Axis: 45 degrees
P Axis: 49 degrees
P-R Interval: 142 ms
P-R Interval: 140 ms
Q-T Interval: 360 ms
Q-T Interval: 354 ms
QRS Duration: 80 ms
QRS Duration: 80 ms
QTC Calculation (Bezet): 506 ms
QTC Calculation (Bezet): 497 ms
R Axis: 45 degrees
R Axis: 45 degrees
T Axis: 217 degrees
T Axis: 217 degrees
Ventricular Rate: 119 {beats}/min
Ventricular Rate: 119 {beats}/min

## 2021-12-24 LAB — PROTEIN, CSF: CSF Protein: 52 mg/dL — ABNORMAL HIGH (ref 15.0–40.0)

## 2021-12-24 LAB — GLUCOSE WHOLE BLOOD - POCT: Whole Blood Glucose POCT: 89 mg/dL (ref 70–100)

## 2021-12-24 LAB — COVID-19 (SARS-COV-2): SARS CoV 2 Overall Result: NOT DETECTED

## 2021-12-24 LAB — HIGH SENSITIVITY TROPONIN-I: hs Troponin-I: 5.4 ng/L

## 2021-12-24 LAB — GLUCOSE CSF: CSF Glucose: 59 mg/dL (ref 40–70)

## 2021-12-24 LAB — GFR: EGFR: >60

## 2021-12-24 LAB — CK: Creatine Kinase (CK): 280 U/L — ABNORMAL HIGH (ref 29–233)

## 2021-12-24 MED ORDER — SODIUM CHLORIDE 0.9 % IV BOLUS
1000.0000 mL | Freq: Once | INTRAVENOUS | Status: AC
Start: 2021-12-24 — End: 2021-12-24
  Administered 2021-12-24: 1000 mL via INTRAVENOUS

## 2021-12-24 MED ORDER — IMMUNE GLOBULIN (HUMAN) 100 MG/ML IJ/IV SOLN (WRAP)
0.4000 g/kg | Freq: Once | Status: AC
Start: 2021-12-24 — End: 2021-12-25
  Administered 2021-12-25: 22.5 g via INTRAVENOUS
  Filled 2021-12-24: qty 225

## 2021-12-24 MED ORDER — MORPHINE SULFATE 4 MG/ML IJ/IV SOLN (WRAP)
4.0000 mg | Freq: Once | Status: AC
Start: 2021-12-24 — End: 2021-12-24
  Administered 2021-12-24: 4 mg via INTRAVENOUS
  Filled 2021-12-24: qty 1

## 2021-12-24 MED ORDER — DIPHENHYDRAMINE HCL 25 MG PO CAPS
25.0000 mg | ORAL_CAPSULE | Freq: Once | ORAL | Status: AC
Start: 2021-12-24 — End: 2021-12-25
  Administered 2021-12-25: 25 mg via ORAL
  Filled 2021-12-24: qty 1

## 2021-12-24 MED ORDER — ONDANSETRON HCL 4 MG/2ML IJ SOLN
4.0000 mg | Freq: Once | INTRAMUSCULAR | Status: AC
Start: 2021-12-24 — End: 2021-12-24
  Administered 2021-12-24: 4 mg via INTRAVENOUS
  Filled 2021-12-24: qty 2

## 2021-12-24 MED ORDER — ACETAMINOPHEN 500 MG PO TABS
1000.0000 mg | ORAL_TABLET | Freq: Once | ORAL | Status: DC
Start: 2021-12-24 — End: 2021-12-25
  Filled 2021-12-24: qty 2

## 2021-12-24 MED ORDER — ACETAMINOPHEN 325 MG PO TABS
650.0000 mg | ORAL_TABLET | Freq: Once | ORAL | Status: AC
Start: 2021-12-24 — End: 2021-12-25
  Administered 2021-12-25: 650 mg via ORAL
  Filled 2021-12-24: qty 2

## 2021-12-24 NOTE — EDIE (Signed)
COLLECTIVE?NOTIFICATION?12/24/2021 18:05?[name], [name]?MRN: [id number]    [place] [facility]'s patient encounter information:   VWU:[id number]  Account [id number]  Billing Account [id number]      Criteria Met      5 ED Visits in 12 Months    3 Different Facilities in 90 Days    Security and Safety  No Security Events were found.  ED Care Guidelines  There are currently no ED Care Guidelines for this patient. Please check your facility's medical records system.    Flags      Negative COVID-19 Lab Result - [facility] - A specimen collected from this patient was negative for COVID-19 / Attributed By: [place] Department of [facility] / Attributed On: 11/27/2021       Prescription Monitoring Program  441??- Narcotic Use Score  381??- Sedative Use Score  000??- Stimulant Use Score  300??- Overdose Risk Score  - All Scores range from 000-999 with 75% of the population scoring < 200 and on 1% scoring above 650  - The last digit of the narcotic, sedative, and stimulant score indicates the number of active prescriptions of that type  - Higher Use scores correlate with increased prescribers, pharmacies, mg equiv, and overlapping prescriptions  - Higher Overdose Risk Scores correlate with increased risk of unintentional overdose death   Concerning or unexpectedly high scores should prompt a review of the PMP record; this does not constitute checking PMP for prescribing purposes.    E.D. Visit Count (12 mo.)  Facility Visits   [facility] 11   [facility] 1   [place] 15   Total 27   Note: Visits indicate total known visits.     Recent Emergency Department Visit Summary  Showing 10 most recent visits out of 27 in the past 12 months   Date Facility [facility] Type Diagnoses or Chief Complaint    Dec 24, 2021  [facility]  [name]  [place]  Emergency      Numbess; Drooling      Dec 23, 2021  [facility]  [name]  [place]  Emergency       RS/ WEAKNESS      Weakness      Urinary tract infection, site not specified      Nausea      Dec 11, 2021  [facility] [place].  [name]  [place]  Emergency      rs/ reaction to medication      Paresthesia of skin      Hypokalemia      Nov 30, 2021  [facility] [place]  [name]  [place]  Emergency      RS/ CHEST PAIN      Constipation, unspecified      Rhabdomyolysis      Nausea with vomiting, unspecified      Nov 28, 2021  [facility] [place] [facility]  [place]  Emergency      gastric and bowels, PCP told to come in      Generalized abdominal pain      Constipation, unspecified      1. Nausea with vomiting, unspecified      2. Unspecified abdominal pain      2. Abnormal weight loss      3. Type 2 diabetes mellitus without complications      3. Shortness of breath      4.  Chronic obstructive pulmonary disease, unspecified      4. Hypertensive heart disease with heart failure      Nov 26, 2021  [facility] [place]  [name]  [place]  Emergency      ABNORMAL LABS-LEG PAIN-FROM DR. WILLIAMS      Rhabdomyolysis      1. Pain in left lower leg      2. Pain in right lower leg      2. Other chest pain      3. Hemiplegia and hemiparesis following cerebral infarction affecting left non-dominant side      4. Essential (primary) hypertension      5. Paroxysmal atrial fibrillation      6. Type 2 diabetes mellitus without complications      7. Chronic obstructive pulmonary disease, unspecified      Nov 18, 2021  [facility] [place]  [name]  [place]  Emergency      CHEST PAIN      Chest pain, unspecified      Nov 13, 2021  [facility]  [place]  Emergency      HAVENT HAD A BOWEL MOVEMENT IN A MONTH. FEELING WEAK      Constipation, unspecified      1. Unspecified abdominal pain      2. Nausea      2. Essential (primary) hypertension      3. Chronic obstructive pulmonary disease, unspecified      4. Type 2 diabetes mellitus without complications      5. Paroxysmal atrial fibrillation      6.  Hyperlipidemia, unspecified      7. Bipolar disorder, unspecified      Oct 22, 2021  [facility] [place] .  [place]  Emergency      vomiting, dehydration, body aches      1. Epigastric pain      2. Nausea with vomiting, unspecified      3. Essential (primary) hypertension      4. Type 2 diabetes mellitus without complications      5. Nutritional anemia, unspecified      6. Paroxysmal atrial fibrillation      7. Chronic obstructive pulmonary disease, unspecified      8. Bipolar disorder, unspecified      9. Hyperlipidemia, unspecified      Oct 14, 2021  [facility] [place]  [name]  [place]  Emergency      CHEST PAIN      1. Nausea with vomiting, unspecified      2. Chest pain, unspecified      2. Dehydration      3. Essential (primary) hypertension      4. Unspecified atrial fibrillation      5. Long term (current) use of anticoagulants      6. Hemiplegia and hemiparesis following cerebral infarction affecting left non-dominant side      7. Chronic obstructive pulmonary disease, unspecified      8. Type 2 diabetes mellitus without complications        Recent Inpatient Visit Summary  Date Facility [facility] Type Diagnoses or Chief Complaint    Nov 30, 2021  [facility]  [name]  [place]  Family Practice      Constipation, unspecified      1. Nausea with vomiting, unspecified      1. Hypokalemia      2. Cerebral  infarction, unspecified      2. Unspecified abdominal pain      3. Shortness of breath      3. Hemiplegia and hemiparesis following cerebral infarction affecting left non-dominant side      4. Rhabdomyolysis      5. Other disorders of phosphorus metabolism      6. Epilepsy, unspecified, not intractable, without status epilepticus      Aug 24, 2021  [facility] [place]  [name]  [place]  Family Practice      Acute renal failure (ARF) (HCC)      1. Acute kidney failure, unspecified      2. Other chest pain      2. Hemiplegia and hemiparesis following cerebral infarction affecting  left non-dominant side      3. Pain in left shoulder      3. Hypotension, unspecified      4. Nausea      4. Thoracic aortic aneurysm, without rupture, unspecified      5. Type 2 diabetes mellitus without complications      6. Epilepsy, unspecified, not intractable, without status epilepticus      Jun 29, 2021  [facility] [place]  [name]  [place]  General Medicine      Chest pain, unspecified type      Apr 21, 2021  [facility] [place]  [name]  [place]  Family Practice      HEADACHE      LEFT SIDED WEAKNESS      Left sided numbness      Facial paresthesia      Left-sided weakness      Mar 11, 2021  [facility] [place]  [name]  [place]  General Medicine      FACIAL DROOP      1. Facial weakness      2. Hemiplegia and hemiparesis following cerebral infarction affecting left non-dominant side      3. Paroxysmal atrial fibrillation      4. Long term (current) use of anticoagulants      5. Pure hypercholesterolemia, unspecified      6. Obesity, unspecified      7. Body mass index [BMI] 37.0-37.9, adult      8. Chronic obstructive pulmonary disease, unspecified      9. Gastro-esophageal reflux disease without esophagitis        Care Team  Provider Specialty Phone Fax Service Dates   [name] , M.D. Family Medicine [phone]  Current      Collective Portal  This patient has registered at the [facility] Emergency Department   For more information visit: [email or web address]     PLEASE NOTE:     1.   Any care recommendations and other clinical information are provided as guidelines or for historical purposes only, and providers should exercise their own clinical judgment when providing care.    2.   You may only use this information for purposes of treatment, payment or health care operations activities, and subject to the limitations of applicable Collective Policies.    3.   You should consult directly with  the organization that provided a care guideline or other clinical history with any questions about additional information or accuracy or completeness of information provided.    ? 2023 [place] - [email or web address]

## 2021-12-24 NOTE — ED Notes (Signed)
Patient transported to CT 

## 2021-12-24 NOTE — ED Provider Notes (Signed)
EMERGENCY DEPARTMENT NOTE     Patient initially seen and examined at   ED PHYSICIAN ASSIGNED       Date/Time Event User Comments    12/24/21 1852 Physician Assigned Yekaterina Escutia C. Lecretia Buczek, Assunta Gambles, DO assigned as Attending           ED MIDLEVEL (APP) ASSIGNED       None            HISTORY OF PRESENT ILLNESS       Chief Complaint: Numbness, Chest Pain, and Headache       66 y.o. female with past medical history as below presents with generalized weakness, chest pain. Was recently admitted to [facility] for rhabdo from 7th-17th. Was discharged and could not ambulate due to weakness. Took her back and she was admitted again for "the same thing". Has  been home since the 20th of February. Daughter states she lives with her and she is normally independent. She has been drooling and her "eyes seem shifty", incontinence of urine, change in speech. She has numbness in both legs and feet which was seen at [facility] as well. Occupational therapist called 911 yesterday and [facility] said she needs PT and OT but PTOT feel she needs to be admitted for further workup. Last time she walked indepently was beginning of February before initial admission. Last time speaking normally was about two weeks ago. Patient states she has chest pain feels "funny in her head" and states legs and hands feel "flimsy".  Chest pain started 2-3 days ago and described as tight and heavy.     Independent Historian (other than patient): No  Additional History Provided by Independent Historian:  MEDICAL HISTORY     Past Medical History:  Past Medical History:   Diagnosis Date   . Anemia    . Asthma    . Atrial fibrillation    . Chronic obstructive pulmonary disease    . Convulsions    . Depression    . Diabetes mellitus     diet controlled    . Hyperlipidemia    . Hypertension    . TIA (transient ischemic attack)        Past Surgical History:  Past Surgical History:   Procedure Laterality Date   . CARDIAC CATHETERIZATION  2017   . HYSTERECTOMY     . pci          Social History:  Social History     Socioeconomic History   . Marital status: Divorced   Tobacco Use   . Smoking status: Never   . Smokeless tobacco: Never   Vaping Use   . Vaping Use: Never used   Substance and Sexual Activity   . Alcohol use: Never   . Drug use: Never   Other Topics Concern   . Acutrim No   . Byetta No   . Contrave No   . Dexatrim No   . Diethylpropion No   . Fastin No   . Fen - Phen No   . Ionamin / Adipex No   . Phentermine No   . Qsymia No   . Prozac No   . Saxenda No   . Topamax No   . Wellbutrin No   . Xenical (Orlistat, Alli) No   . Other Med No     Social Determinants of Health     Financial Resource Strain: Low Risk    . Difficulty of Paying Living Expenses: Not hard at all  Food Insecurity: No Food Insecurity   . Worried About Programme researcher, broadcasting/film/video in the Last Year: Never true   . Ran Out of Food in the Last Year: Never true   Transportation Needs: Unmet Transportation Needs   . Lack of Transportation (Medical): Yes   . Lack of Transportation (Non-Medical): Yes   Physical Activity: Insufficiently Active   . Days of Exercise per Week: 2 days   . Minutes of Exercise per Session: 20 min   Stress: Stress Concern Present   . Feeling of Stress : To some extent   Social Connections: Moderately Integrated   . Frequency of Communication with Friends and Family: More than three times a week   . Frequency of Social Gatherings with Friends and Family: Twice a week   . Attends Religious Services: More than 4 times per year   . Active Member of Clubs or Organizations: Yes   . Attends Banker Meetings: More than 4 times per year   . Marital Status: Divorced   Catering manager Violence: Not At Risk   . Fear of Current or Ex-Partner: No   . Emotionally Abused: No   . Physically Abused: No   . Sexually Abused: No       Family History:  History reviewed. No pertinent family history.    Outpatient Medication:  Previous Medications    ACETAMINOPHEN (TYLENOL) 325 MG TABLET    Take 2 tablets  (650 mg total) by mouth every 4 (four) hours as needed for Pain    ALBUTEROL (PROVENTIL HFA) 108 (90 BASE) MCG/ACT INHALER    Inhale 1 puff into the lungs daily Also take it PRN every 20 minutes.    AMLODIPINE (NORVASC) 2.5 MG TABLET    Take 1 tablet (2.5 mg total) by mouth daily    APIXABAN (ELIQUIS) 5 MG    Take 1 tablet (5 mg total) by mouth every 12 (twelve) hours    ATORVASTATIN (LIPITOR) 40 MG TABLET    Take 40 mg by mouth daily    BUDESONIDE-FORMOTEROL (SYMBICORT) 80-4.5 MCG/ACT INHALER    Inhale 2 puffs into the lungs daily    BUTALBITAL-ACETAMINOPHEN-CAFFEINE (FIORICET) 50-325-40 MG PER TABLET    Take 1 tablet by mouth every 6 (six) hours as needed for Headaches    FUROSEMIDE (LASIX) 20 MG TABLET    Take 1 tablet (20 mg total) by mouth daily    LAMOTRIGINE (LAMICTAL) 150 MG TABLET    Take 1 tablet (150 mg total) by mouth 2 (two) times daily    LORAZEPAM (ATIVAN) 0.5 MG TABLET    Take 0.5 mg by mouth daily as needed for Anxiety    LURASIDONE HCL (LATUDA) 40 MG TAB    Take 40 mg by mouth nightly    METOPROLOL SUCCINATE XL (TOPROL-XL) 25 MG 24 HR TABLET    Take 25 mg by mouth daily    PANTOPRAZOLE (PROTONIX) 40 MG TABLET    Take 1 tablet (40 mg total) by mouth daily    QUETIAPINE (SEROQUEL) 50 MG TABLET    Take 1 tablet (50 mg total) by mouth nightly    ROPINIROLE (REQUIP) 1 MG TABLET    Take 1 tablet (1 mg total) by mouth nightly    TERAZOSIN (HYTRIN) 2 MG CAPSULE    Take 1 capsule (2 mg total) by mouth nightly         REVIEW OF SYSTEMS   Review of Systems See History of Present Illness  PHYSICAL EXAM  ED Triage Vitals [12/24/21 1807]   Enc Vitals Group      BP 155/79      Heart Rate (!) 121      Resp Rate 19      Temp 98.1 F (36.7 C)      Temp Source Oral      SpO2 98 %      Weight       Height       Head Circumference       Peak Flow       Pain Score 9      Pain Loc       Pain Edu?       Excl. in GC?      Physical Exam   Vital Signs and Nursing notes reviewed.    Constitutional: well appearing, well  hydrated, non toxic, comfortable  HEENT: NC/AT, moist mucus membranes, oropharynx clear, nose normal, Pupils equal and reactive, EOMI  Neck: Supple, non tender, no bony or midline tenderness, full ROM, no meningeal signs  Pulmonary: CTAB, no wheezes rales or rhonchi. Normal work of breathing. No respiratory distress.  Cardiovascular: Regular Rate and Rhythm. No murmurs, rubs, or gallops.   Abdomen: Soft, non tender, non distended. No rebound, guarding, or rigidity  Extremities: no deformities, no edema, normal ROM, compartments soft, neurovascularly intact throughout  Skin: normal, no rash, lesions, wounds, edema or color change  Neuro: CN 2-12 intact grossly, no facial asymmetry, no focal weakness, AAOx3.  2 out of 5 muscle strength in lower extremities bilaterally.  Sensation intact.  5 out of 5 strength in upper extremities.  Psych: normal mood and affect       MEDICAL DECISION MAKING     PRIMARY PROBLEM LIST      Acute illness/injury DIAGNOSIS: Weakness     Differential Diagnosis: Neuro Deficit: CVA, TIA, intracranial hemorrhage, Peripheral Neuropathy, Cranial Nerve Palsy, demyelinating disease  DISCUSSION      66 year old female presents with generalized weakness worse in lower extremities over the past few weeks.  She was recently admitted for rhabdomyolysis at [facility].  She was seen at [facility] yesterday and was discharged from the emergency department with recommendation to see PT OT for her weakness.  Daughter patient states that she previously was independent and ambulatory independently approximately 1.5 months ago and that she has declined significantly.  She states that she needs a walker to stand and is no longer able to ambulate independently.  She has weakness in her lower extremities.  Labs reassuring.  Case discussed with Dr. Francesco Sor.     If patient is being hospitalized is severe sepsis or septic shock suspected?: N/A      Was management discussed with a consultant?: Yes (explain)  Was  the decision around the need for surgery discussed with consultant: N/A  External Records Reviewed?: Inpatient Records    Additional Notes              ED Course as of 12/25/21 0033   Fri Dec 24, 2021   1859 Per chart:  Pt with complicated medical history to include afib and extensive cardiovascular dz, hx of CVA, chornic anemia with recent case of Rhabdo - now resolved, but now pt has progressive weakness and failure to thrive.  Advised dtr to take patient to [facility] hospital. She needs inpt assessment of her weakness and change in functional status. Advised if she is unable to get patient to the car she should check with EMS about transport  to [facility] vs [facility]." [LP]   2046 MRI head: 12/13/21:  IMPRESSION:   1. No acute intracranial abnormality.   2.Stable noncontrast MRI appearance of the brain since last year.   Moderately advanced cerebral white matter signal changes,   nonspecific but most commonly due to chronic small vessel disease.    [LP]   2048 Neurologist paged [LP]   2136 Spoke to dr Francesco Sor - he commends Mri brain, c spine, t spine, l spine with and withut, LP. Emg nerve conductiojn study, ivig 0.4grams/kg daily for 5 days.  States patient would likely need transfer to higher level of care as we cannot do nerve conduction studies, etc.  [LP]   2337 MRI tech available to do stat MRI studies here [LP]   2357 Transfer center states [facility] is at capacity but will have bed available in AM. Spoke to dr Francesco Sor who states okay to wait for AM transfer as long as MRI's arent abnormal. If abnormal will likely need er to er transfer tonight. LP completed without complication. MRIs ordered. IVIG ordered [LP]   Sat Dec 25, 2021   0032 Signed out to dr Lowell Guitar pending transfer - awaiting call back from [facility] hospitalist [LP]      ED Course User Index  [LP] Waqas Bruhl, Assunta Gambles, DO         Vital Signs: Reviewed the patient's vital signs.   Nursing Notes: Reviewed and utilized available nursing notes.  Medical Records Reviewed:  Reviewed available past medical records.  Counseling: The emergency provider has spoken with the patient and discussed today's findings, in addition to providing specific details for the plan of care.  Questions are answered and there is agreement with the plan.      MIPS DOCUMENTATION              CARDIAC STUDIES    The following cardiac studies were independently interpreted by me the Emergency Medicine Provider.  For full cardiac study results please see chart.    Monitor Strip interpreted by me (ED provider)  Rate: 101-150  Rhythm: Sinus Tachycardia  ST segments: No acute changes    EKG 1 interpreted by me (ED provider)  Comparison: None available  Rate: 101-150  Rhythm: Sinus Tachycardia  ST segments: Other (explain) twi in 1,2,3, avf, v3-v6  EKG interpretation: Nonspecific                EMERGENCY IMAGING STUDIES    The following imagine studies were independently interpreted by me (emergency medicine provider):               CT Head Interpreted by me (ED Provider)  Comparison: None available  RESULT: No Hemorrhage or Mass Effect  IMPRESSION: No acute abnormality  RADIOLOGY IMAGING STUDIES      Chest AP Portable   Final Result    No acute pulmonary or pleural disease.      Sandie Ano, MD    12/24/2021 7:14 PM          EMERGENCY DEPT. MEDICATIONS      ED Medication Orders (From admission, onward)      None            LABORATORY RESULTS    Ordered and independently interpreted AVAILABLE laboratory tests.   Results       Procedure Component Value Units Date/Time    High Sensitivity Troponin-I [[id number]] Collected: 12/24/21 1838    Specimen: Blood Updated: 12/24/21 1918     hs Troponin-I 5.4 ng/L  Comprehensive metabolic panel [[id number]]  (Abnormal) Collected: 12/24/21 1838    Specimen: Blood Updated: 12/24/21 1915     Glucose 97 mg/dL      BUN 8.0 mg/dL      Creatinine 0.6 mg/dL      Sodium 191 mEq/L      Potassium 3.5 mEq/L      Chloride 97 mEq/L      CO2 22 mEq/L      Calcium 10.0 mg/dL      Protein, Total  7.1 g/dL      Albumin 3.6 g/dL      AST (SGOT) 35 U/L      ALT 67 U/L      Alkaline Phosphatase 68 U/L      Bilirubin, Total 0.4 mg/dL      Globulin 3.5 g/dL      Albumin/Globulin Ratio 1.0     Anion Gap 19.0    GFR [[id number]] Collected: 12/24/21 1838     Updated: 12/24/21 1915     EGFR >60.0    CBC and differential [[id number]]  (Abnormal) Collected: 12/24/21 1838    Specimen: Blood Updated: 12/24/21 1858     WBC 8.38 x10 3/uL      Hgb 9.5 g/dL      Hematocrit 96.2 %      Platelets 456 x10 3/uL      RBC 3.78 x10 6/uL      MCV 79.1 fL      MCH 25.1 pg      MCHC 31.8 g/dL      RDW 15 %      MPV 8.2 fL      Instrument Absolute Neutrophil Count 5.93 x10 3/uL      Neutrophils 70.8 %      Lymphocytes Automated 20.5 %      Monocytes 7.4 %      Eosinophils Automated 0.1 %      Basophils Automated 0.4 %      Immature Granulocytes 0.8 %      Nucleated RBC 0.0 /100 WBC      Neutrophils Absolute 5.93 x10 3/uL      Lymphocytes Absolute Automated 1.72 x10 3/uL      Monocytes Absolute Automated 0.62 x10 3/uL      Eosinophils Absolute Automated 0.01 x10 3/uL      Basophils Absolute Automated 0.03 x10 3/uL      Immature Granulocytes Absolute 0.07 x10 3/uL      Absolute NRBC 0.00 x10 3/uL     Glucose Whole Blood - POCT [[id number]] Collected: 12/24/21 1810     Updated: 12/24/21 1812     Whole Blood Glucose POCT 89 mg/dL               CRITICAL CARE/PROCEDURES    Lumbar Puncture    Date/Time: 12/24/2021 10:52 PM  Performed by: Ammie Dalton, DO  Authorized by: Ammie Dalton, DO   Consent: Verbal consent obtained. Written consent obtained.  Risks and benefits: risks, benefits and alternatives were discussed  Consent given by: patient  Patient understanding: patient states understanding of the procedure being performed  Patient consent: the patient's understanding of the procedure matches consent given  Procedure consent: procedure consent matches procedure scheduled  Site marked: the operative site was marked  Required items:  required blood products, implants, devices, and special equipment available  Patient identity confirmed: arm band and verbally with patient  Time out: Immediately prior to procedure a "time out" was called to verify  the correct patient, procedure, equipment, support staff and site/side marked as required.  Indications: evaluation for infection  Anesthesia: local infiltration    Anesthesia:  Local Anesthetic: lidocaine 1% without epinephrine  Anesthetic total: 5 mL    Sedation:  Patient sedated: no    Preparation: Patient was prepped and draped in the usual sterile fashion.  Lumbar space: L4-L5 interspace  Patient's position: right lateral decubitus  Needle gauge: 20  Needle type: spinal needle - Quincke tip  Number of attempts: 1  Fluid appearance: clear  Tubes of fluid: 4  Total volume: 4 ml  Post-procedure: site cleaned and adhesive bandage applied      Critical Care  Performed by: Ammie Dalton, DO  Authorized by: Ammie Dalton, DO     Critical care provider statement:     Critical care time (minutes):  45    Critical care time was exclusive of:  Separately billable procedures and treating other patients    Critical care was necessary to treat or prevent imminent or life-threatening deterioration of the following conditions:  CNS failure or compromise    Critical care was time spent personally by me on the following activities:  Blood draw for specimens, development of treatment plan with patient or surrogate, discussions with consultants, evaluation of patient's response to treatment, examination of patient, interpretation of cardiac output measurements, obtaining history from patient or surrogate, ordering and performing treatments and interventions, ordering and review of laboratory studies, ordering and review of radiographic studies, pulse oximetry and re-evaluation of patient's condition    I assumed direction of critical care for this patient from another provider in my specialty: no      Care  discussed with: accepting provider at another facility    Critical care?  DIAGNOSIS      Diagnosis:  Final diagnoses:   None       Disposition:  ED Disposition       None            Prescriptions:  Patient's Medications   New Prescriptions    No medications on file   Previous Medications    ACETAMINOPHEN (TYLENOL) 325 MG TABLET    Take 2 tablets (650 mg total) by mouth every 4 (four) hours as needed for Pain    ALBUTEROL (PROVENTIL HFA) 108 (90 BASE) MCG/ACT INHALER    Inhale 1 puff into the lungs daily Also take it PRN every 20 minutes.    AMLODIPINE (NORVASC) 2.5 MG TABLET    Take 1 tablet (2.5 mg total) by mouth daily    APIXABAN (ELIQUIS) 5 MG    Take 1 tablet (5 mg total) by mouth every 12 (twelve) hours    ATORVASTATIN (LIPITOR) 40 MG TABLET    Take 40 mg by mouth daily    BUDESONIDE-FORMOTEROL (SYMBICORT) 80-4.5 MCG/ACT INHALER    Inhale 2 puffs into the lungs daily    BUTALBITAL-ACETAMINOPHEN-CAFFEINE (FIORICET) 50-325-40 MG PER TABLET    Take 1 tablet by mouth every 6 (six) hours as needed for Headaches    FUROSEMIDE (LASIX) 20 MG TABLET    Take 1 tablet (20 mg total) by mouth daily    LAMOTRIGINE (LAMICTAL) 150 MG TABLET    Take 1 tablet (150 mg total) by mouth 2 (two) times daily    LORAZEPAM (ATIVAN) 0.5 MG TABLET    Take 0.5 mg by mouth daily as needed for Anxiety    LURASIDONE HCL (LATUDA) 40 MG TAB    Take 40 mg  by mouth nightly    METOPROLOL SUCCINATE XL (TOPROL-XL) 25 MG 24 HR TABLET    Take 25 mg by mouth daily    PANTOPRAZOLE (PROTONIX) 40 MG TABLET    Take 1 tablet (40 mg total) by mouth daily    QUETIAPINE (SEROQUEL) 50 MG TABLET    Take 1 tablet (50 mg total) by mouth nightly    ROPINIROLE (REQUIP) 1 MG TABLET    Take 1 tablet (1 mg total) by mouth nightly    TERAZOSIN (HYTRIN) 2 MG CAPSULE    Take 1 capsule (2 mg total) by mouth nightly   Modified Medications    No medications on file   Discontinued Medications    No medications on file           This note was generated by the Epic EMR system/  Dragon speech recognition and may contain inherent errors or omissions not intended by the user. Grammatical errors, random word insertions, deletions and pronoun errors  are occasional consequences of this technology due to software limitations. Not all errors are caught or corrected. If there are questions or concerns about the content of this note or information contained within the body of this dictation they should be addressed directly with the author for clarification.     Deziree Mokry, Assunta Gambles, DO  12/24/21 2254

## 2021-12-25 ENCOUNTER — Emergency Department: Payer: 59

## 2021-12-25 ENCOUNTER — Inpatient Hospital Stay
Admission: AD | Admit: 2021-12-25 | Discharge: 2022-01-03 | DRG: 092 | Disposition: A | Discharge status: Rehabilitation Facility | Payer: 59 | Source: Other Acute Inpatient Hospital | Attending: Internal Medicine | Admitting: Internal Medicine

## 2021-12-25 DIAGNOSIS — Z91040 Latex allergy status: Secondary | ICD-10-CM

## 2021-12-25 DIAGNOSIS — R29898 Other symptoms and signs involving the musculoskeletal system: Secondary | ICD-10-CM | POA: Diagnosis present

## 2021-12-25 DIAGNOSIS — Z95810 Presence of automatic (implantable) cardiac defibrillator: Secondary | ICD-10-CM

## 2021-12-25 DIAGNOSIS — E785 Hyperlipidemia, unspecified: Secondary | ICD-10-CM | POA: Diagnosis present

## 2021-12-25 DIAGNOSIS — Z20822 Contact with and (suspected) exposure to covid-19: Secondary | ICD-10-CM | POA: Diagnosis present

## 2021-12-25 DIAGNOSIS — I480 Paroxysmal atrial fibrillation: Secondary | ICD-10-CM | POA: Diagnosis present

## 2021-12-25 DIAGNOSIS — I110 Hypertensive heart disease with heart failure: Secondary | ICD-10-CM | POA: Diagnosis present

## 2021-12-25 DIAGNOSIS — E876 Hypokalemia: Secondary | ICD-10-CM | POA: Diagnosis present

## 2021-12-25 DIAGNOSIS — E1143 Type 2 diabetes mellitus with diabetic autonomic (poly)neuropathy: Secondary | ICD-10-CM | POA: Diagnosis present

## 2021-12-25 DIAGNOSIS — E559 Vitamin D deficiency, unspecified: Secondary | ICD-10-CM | POA: Diagnosis present

## 2021-12-25 DIAGNOSIS — E440 Moderate protein-calorie malnutrition: Secondary | ICD-10-CM | POA: Diagnosis present

## 2021-12-25 DIAGNOSIS — T466X5A Adverse effect of antihyperlipidemic and antiarteriosclerotic drugs, initial encounter: Secondary | ICD-10-CM | POA: Diagnosis present

## 2021-12-25 DIAGNOSIS — G610 Guillain-Barre syndrome: Secondary | ICD-10-CM | POA: Insufficient documentation

## 2021-12-25 DIAGNOSIS — M6282 Rhabdomyolysis: Secondary | ICD-10-CM | POA: Diagnosis present

## 2021-12-25 DIAGNOSIS — Z880 Allergy status to penicillin: Secondary | ICD-10-CM

## 2021-12-25 DIAGNOSIS — I351 Nonrheumatic aortic (valve) insufficiency: Secondary | ICD-10-CM | POA: Diagnosis present

## 2021-12-25 DIAGNOSIS — E8729 Other acidosis: Secondary | ICD-10-CM | POA: Diagnosis present

## 2021-12-25 DIAGNOSIS — R159 Full incontinence of feces: Secondary | ICD-10-CM | POA: Diagnosis present

## 2021-12-25 DIAGNOSIS — Z881 Allergy status to other antibiotic agents status: Secondary | ICD-10-CM

## 2021-12-25 DIAGNOSIS — M546 Pain in thoracic spine: Secondary | ICD-10-CM

## 2021-12-25 DIAGNOSIS — R32 Unspecified urinary incontinence: Secondary | ICD-10-CM | POA: Diagnosis present

## 2021-12-25 DIAGNOSIS — E119 Type 2 diabetes mellitus without complications: Secondary | ICD-10-CM | POA: Diagnosis present

## 2021-12-25 DIAGNOSIS — F319 Bipolar disorder, unspecified: Secondary | ICD-10-CM | POA: Diagnosis present

## 2021-12-25 DIAGNOSIS — I5022 Chronic systolic (congestive) heart failure: Secondary | ICD-10-CM | POA: Diagnosis present

## 2021-12-25 DIAGNOSIS — K5900 Constipation, unspecified: Secondary | ICD-10-CM | POA: Diagnosis present

## 2021-12-25 DIAGNOSIS — Z7951 Long term (current) use of inhaled steroids: Secondary | ICD-10-CM

## 2021-12-25 DIAGNOSIS — D509 Iron deficiency anemia, unspecified: Secondary | ICD-10-CM | POA: Diagnosis present

## 2021-12-25 DIAGNOSIS — J449 Chronic obstructive pulmonary disease, unspecified: Secondary | ICD-10-CM | POA: Diagnosis present

## 2021-12-25 DIAGNOSIS — Z7901 Long term (current) use of anticoagulants: Secondary | ICD-10-CM

## 2021-12-25 DIAGNOSIS — I428 Other cardiomyopathies: Secondary | ICD-10-CM | POA: Diagnosis present

## 2021-12-25 DIAGNOSIS — Z91041 Radiographic dye allergy status: Secondary | ICD-10-CM

## 2021-12-25 DIAGNOSIS — Z888 Allergy status to other drugs, medicaments and biological substances status: Secondary | ICD-10-CM

## 2021-12-25 DIAGNOSIS — E60 Dietary zinc deficiency: Secondary | ICD-10-CM | POA: Diagnosis present

## 2021-12-25 DIAGNOSIS — Z6826 Body mass index (BMI) 26.0-26.9, adult: Secondary | ICD-10-CM

## 2021-12-25 DIAGNOSIS — K3184 Gastroparesis: Secondary | ICD-10-CM

## 2021-12-25 DIAGNOSIS — G7289 Other specified myopathies: Principal | ICD-10-CM | POA: Diagnosis present

## 2021-12-25 LAB — CELL COUNT CSF TUBE #1
CSF Lymphocytes Tube #1: 40 % (ref 40–80)
CSF Macrophages Tube #1: 60 % — ABNORMAL HIGH (ref 15–45)
CSF Neutrophils Tube #1: 0 % (ref 0–6)
CSF RBC Count Tube #1: 0 /mm3 (ref 0–3)
CSF WBC Count Tube #1: 1 /mm3 (ref 0–5)

## 2021-12-25 LAB — CSF MENINGITIS/ENCEPHALITIS PATHOGEN PANEL PCR
CSF Cryptococcus neoformans/gattii by PCR: NOT DETECTED
CSF Cytomegalovirus by PCR: NOT DETECTED
CSF Enterovirus by PCR: NOT DETECTED
CSF Eschericia coli K1 by PCR: NOT DETECTED
CSF Haemophilus influenza by PCR: NOT DETECTED
CSF Herpes simplex virus 1 by PCR: NOT DETECTED
CSF Herpes simplex virus 2 by PCR: NOT DETECTED
CSF Human herpesvirus 6 by PCR: NOT DETECTED
CSF Human parechovirus by PCR: NOT DETECTED
CSF Listeria monocytogenes by PCR: NOT DETECTED
CSF Neisseria meningitidis (encapsulated) by PCR: NOT DETECTED
CSF Streptococcus agalactiae by PCR: NOT DETECTED
CSF Streptococcus pneumoniae by PCR: NOT DETECTED
CSF Varicella zoster virus by PCR: NOT DETECTED

## 2021-12-25 LAB — CELL COUNT CSF TUBE #4
CSF Lymphocytes Tube #4: 83 % — ABNORMAL HIGH (ref 40–80)
CSF Macrophages Tube #4: 17 % (ref 15–45)
CSF Neutrophils Tube #4: 0 % (ref 0–6)
CSF RBC Count Tube #4: 0 /mm3 (ref 0–3)
CSF WBC Count Tube #4: 1 /mm3 (ref 0–5)

## 2021-12-25 LAB — VITAMIN B12: Vitamin B-12: 442 pg/mL (ref 211–911)

## 2021-12-25 LAB — CK: Creatine Kinase (CK): 239 U/L — ABNORMAL HIGH (ref 29–233)

## 2021-12-25 LAB — TSH: TSH: 1.19 u[IU]/mL (ref 0.35–4.94)

## 2021-12-25 LAB — HIV-1/2 AG/AB 4TH GEN. W/ REFLEX: HIV Ag/Ab, 4th Generation: NONREACTIVE

## 2021-12-25 LAB — HEMOLYSIS INDEX: Hemolysis Index: 2 Index (ref 0–24)

## 2021-12-25 LAB — SYPHILIS SCREEN IGG AND IGM: Syphilis Screen IgG and IgM: NONREACTIVE

## 2021-12-25 LAB — C-REACTIVE PROTEIN: C-Reactive Protein: 2.7 mg/dL — ABNORMAL HIGH (ref 0.0–1.1)

## 2021-12-25 MED ORDER — MELATONIN 3 MG PO TABS
3.0000 mg | ORAL_TABLET | Freq: Every evening | ORAL | Status: DC | PRN
Start: 2021-12-25 — End: 2022-01-03
  Administered 2021-12-28 – 2021-12-30 (×3): 3 mg via ORAL
  Filled 2021-12-25 (×4): qty 1

## 2021-12-25 MED ORDER — GABAPENTIN 100 MG PO CAPS
100.0000 mg | ORAL_CAPSULE | Freq: Three times a day (TID) | ORAL | Status: DC | PRN
Start: 2021-12-25 — End: 2022-01-03
  Administered 2021-12-25 – 2021-12-29 (×4): 100 mg via ORAL
  Filled 2021-12-25 (×4): qty 1

## 2021-12-25 MED ORDER — ACETAMINOPHEN 650 MG RE SUPP
650.0000 mg | Freq: Four times a day (QID) | RECTAL | Status: DC | PRN
Start: 2021-12-25 — End: 2022-01-03

## 2021-12-25 MED ORDER — APIXABAN 5 MG PO TABS
5.0000 mg | ORAL_TABLET | Freq: Two times a day (BID) | ORAL | Status: DC
Start: 2021-12-26 — End: 2022-01-03
  Administered 2021-12-26 – 2022-01-03 (×14): 5 mg via ORAL
  Filled 2021-12-25 (×14): qty 1

## 2021-12-25 MED ORDER — POTASSIUM CHLORIDE 20 MEQ PO PACK
0.0000 meq | PACK | ORAL | Status: DC | PRN
Start: 2021-12-25 — End: 2021-12-27

## 2021-12-25 MED ORDER — ACETAMINOPHEN 325 MG PO TABS
650.0000 mg | ORAL_TABLET | Freq: Four times a day (QID) | ORAL | Status: DC | PRN
Start: 2021-12-25 — End: 2022-01-03
  Administered 2021-12-25 – 2021-12-31 (×6): 650 mg via ORAL
  Filled 2021-12-25 (×7): qty 2

## 2021-12-25 MED ORDER — LORAZEPAM 2 MG/ML IJ SOLN
1.0000 mg | Freq: Once | INTRAMUSCULAR | Status: AC
Start: 2021-12-25 — End: 2021-12-25
  Administered 2021-12-25: 1 mg via INTRAVENOUS
  Filled 2021-12-25: qty 1

## 2021-12-25 MED ORDER — FENTANYL CITRATE (PF) 50 MCG/ML IJ SOLN (WRAP)
50.0000 ug | Freq: Once | INTRAMUSCULAR | Status: AC
Start: 2021-12-25 — End: 2021-12-25
  Administered 2021-12-25: 50 ug via INTRAVENOUS
  Filled 2021-12-25: qty 2

## 2021-12-25 MED ORDER — ROPINIROLE HCL 1 MG PO TABS
1.0000 mg | ORAL_TABLET | Freq: Every evening | ORAL | Status: DC
Start: 2021-12-25 — End: 2022-01-03
  Administered 2021-12-25 – 2022-01-02 (×9): 1 mg via ORAL
  Filled 2021-12-25 (×10): qty 1

## 2021-12-25 MED ORDER — LURASIDONE HCL 40 MG PO TABS
120.0000 mg | ORAL_TABLET | Freq: Every day | ORAL | Status: DC
Start: 2021-12-25 — End: 2022-01-03
  Administered 2021-12-26 – 2022-01-03 (×9): 120 mg via ORAL
  Filled 2021-12-25 (×10): qty 3

## 2021-12-25 MED ORDER — ONDANSETRON HCL 4 MG/2ML IJ SOLN
4.0000 mg | Freq: Four times a day (QID) | INTRAMUSCULAR | Status: DC | PRN
Start: 2021-12-25 — End: 2022-01-03
  Administered 2021-12-31 – 2022-01-03 (×4): 4 mg via INTRAVENOUS
  Filled 2021-12-25 (×4): qty 2

## 2021-12-25 MED ORDER — HYDROMORPHONE HCL 2 MG PO TABS
2.0000 mg | ORAL_TABLET | Freq: Three times a day (TID) | ORAL | Status: DC | PRN
Start: 2021-12-25 — End: 2021-12-29
  Administered 2021-12-25 – 2021-12-29 (×6): 2 mg via ORAL
  Filled 2021-12-25 (×6): qty 1

## 2021-12-25 MED ORDER — MAGNESIUM SULFATE IN D5W 1-5 GM/100ML-% IV SOLN
1.0000 g | INTRAVENOUS | Status: DC | PRN
Start: 2021-12-25 — End: 2022-01-03
  Filled 2021-12-25: qty 100

## 2021-12-25 MED ORDER — AMLODIPINE BESYLATE 5 MG PO TABS
2.5000 mg | ORAL_TABLET | Freq: Every day | ORAL | Status: DC
Start: 2021-12-25 — End: 2022-01-02
  Administered 2021-12-25 – 2022-01-02 (×9): 2.5 mg via ORAL
  Filled 2021-12-25 (×9): qty 1

## 2021-12-25 MED ORDER — PANTOPRAZOLE SODIUM 40 MG PO TBEC
40.0000 mg | DELAYED_RELEASE_TABLET | Freq: Every day | ORAL | Status: DC
Start: 2021-12-25 — End: 2022-01-03
  Administered 2021-12-25 – 2022-01-03 (×10): 40 mg via ORAL
  Filled 2021-12-25 (×11): qty 1

## 2021-12-25 MED ORDER — BUDESONIDE-FORMOTEROL FUMARATE 80-4.5 MCG/ACT IN AERO
2.0000 | INHALATION_SPRAY | Freq: Two times a day (BID) | RESPIRATORY_TRACT | Status: DC
Start: 2021-12-25 — End: 2022-01-03
  Administered 2021-12-25 – 2022-01-02 (×8): 2 via RESPIRATORY_TRACT
  Filled 2021-12-25: qty 6.9

## 2021-12-25 MED ORDER — HYDRALAZINE HCL 20 MG/ML IJ SOLN
10.0000 mg | INTRAMUSCULAR | Status: DC | PRN
Start: 2021-12-25 — End: 2022-01-03
  Administered 2021-12-29: 10 mg via INTRAVENOUS
  Filled 2021-12-25: qty 1

## 2021-12-25 MED ORDER — TERAZOSIN HCL 1 MG PO CAPS
2.0000 mg | ORAL_CAPSULE | Freq: Every evening | ORAL | Status: DC
Start: 2021-12-25 — End: 2022-01-03
  Administered 2021-12-25 – 2022-01-02 (×9): 2 mg via ORAL
  Filled 2021-12-25 (×11): qty 2

## 2021-12-25 MED ORDER — POTASSIUM CHLORIDE CRYS ER 20 MEQ PO TBCR
0.0000 meq | EXTENDED_RELEASE_TABLET | ORAL | Status: DC | PRN
Start: 2021-12-25 — End: 2021-12-27
  Administered 2021-12-26 (×2): 40 meq via ORAL
  Filled 2021-12-25 (×2): qty 2

## 2021-12-25 MED ORDER — THIAMINE HCL 100 MG/ML IJ SOLN
500.0000 mg | Freq: Three times a day (TID) | INTRAVENOUS | Status: AC
Start: 2021-12-25 — End: 2021-12-28
  Administered 2021-12-25 – 2021-12-28 (×9): 500 mg via INTRAVENOUS
  Filled 2021-12-25 (×9): qty 5

## 2021-12-25 MED ORDER — ONDANSETRON 4 MG PO TBDP
4.0000 mg | ORAL_TABLET | Freq: Four times a day (QID) | ORAL | Status: DC | PRN
Start: 2021-12-25 — End: 2022-01-03
  Administered 2022-01-02 – 2022-01-03 (×2): 4 mg via ORAL
  Filled 2021-12-25 (×2): qty 1

## 2021-12-25 MED ORDER — CALCIUM CARBONATE ANTACID 500 MG PO CHEW
1000.0000 mg | CHEWABLE_TABLET | Freq: Four times a day (QID) | ORAL | Status: DC | PRN
Start: 2021-12-25 — End: 2022-01-03

## 2021-12-25 MED ORDER — LABETALOL HCL 5 MG/ML IV SOLN (WRAP)
10.0000 mg | INTRAVENOUS | Status: DC | PRN
Start: 2021-12-25 — End: 2022-01-03

## 2021-12-25 MED ORDER — THIAMINE (VITAMIN B1) 100 MG PO TABS (WRAP)
100.0000 mg | ORAL_TABLET | Freq: Every day | ORAL | Status: DC
Start: 2021-12-31 — End: 2022-01-03
  Administered 2021-12-31 – 2022-01-03 (×4): 100 mg via ORAL
  Filled 2021-12-25 (×4): qty 1

## 2021-12-25 MED ORDER — SENNOSIDES-DOCUSATE SODIUM 8.6-50 MG PO TABS
2.0000 | ORAL_TABLET | Freq: Two times a day (BID) | ORAL | Status: DC | PRN
Start: 2021-12-25 — End: 2022-01-03
  Administered 2021-12-25 – 2021-12-26 (×3): 2 via ORAL
  Filled 2021-12-25 (×3): qty 2

## 2021-12-25 MED ORDER — THIAMINE HCL 100 MG/ML IJ SOLN
250.0000 mg | INTRAVENOUS | Status: AC
Start: 2021-12-28 — End: 2021-12-30
  Administered 2021-12-28 – 2021-12-30 (×3): 250 mg via INTRAVENOUS
  Filled 2021-12-25 (×3): qty 2.5

## 2021-12-25 MED ORDER — METOPROLOL SUCCINATE ER 50 MG PO TB24
25.0000 mg | ORAL_TABLET | Freq: Every day | ORAL | Status: DC
Start: 2021-12-25 — End: 2021-12-31
  Administered 2021-12-25 – 2021-12-31 (×7): 25 mg via ORAL
  Filled 2021-12-25 (×7): qty 1

## 2021-12-25 MED ORDER — APIXABAN 5 MG PO TABS
5.0000 mg | ORAL_TABLET | Freq: Two times a day (BID) | ORAL | Status: DC
Start: 2021-12-25 — End: 2021-12-25

## 2021-12-25 MED ORDER — NALOXONE HCL 0.4 MG/ML IJ SOLN (WRAP)
0.2000 mg | INTRAMUSCULAR | Status: DC | PRN
Start: 2021-12-25 — End: 2022-01-03

## 2021-12-25 MED ORDER — ALBUTEROL SULFATE HFA 108 (90 BASE) MCG/ACT IN AERS
2.0000 | INHALATION_SPRAY | Freq: Four times a day (QID) | RESPIRATORY_TRACT | Status: DC | PRN
Start: 2021-12-25 — End: 2022-01-03
  Filled 2021-12-25: qty 8

## 2021-12-25 NOTE — Consults (Signed)
[facility] Consultation Note                                       Date Time: 12/25/21 1:49 PM  Patient Name: [name]  Requesting Physician: Viviann Spare, MD  Date of Admission: 12/25/2021    CC / Reason for Consultation: LE weakness, paresthesia       Neurology Attending Addendum:    Patient presents with weakness and numbness of arms and legs symmetrically. She has had recent admission to [facility] for weakness, numbness, vomiting, gastroparesis. She says she has not been eating well and has lost 15 lbs in the last few weeks. Denies alcohol or recreational drug use.    On exam she has possible mild ptosis (somewhat inconsistent on exam), restricted EOMs in all directions with some nystagmus on rightward gaze, no facial weakness and CN otherwise intact. Strength 4/5 shoulder abduction, 4+ elbow flexion/extension, 2 hip flexion, 3 knee extension, and 5 ankle dorsi/plantarflexion bilaterally. Light touch intact throughout. Areflexic throughout. Mild-moderate dysmetria with finger-nose bilaterally.    MRI brain neuraxis unrevealing. LP was performed and CSF unrevealing; CSF protein was 52 but this could be considered normal for age.    Exam is notable for ophthalmoparesis, proximal weakness, and ataxia. Given her recent GI issues and weight loss, nutritional deficiencies are a consideration. Inflammatory neuropathy is another consideration though CSF protein could be considered normal. Plan as below.    Mannie Stabile, MD  [facility]    I reviewed this patient's chart and relevant neuro-imaging, laboratory results, and other diagnostic studies. I personally obtained the history, examined the patient, and formulated the plan of care. I agree with the findings and plan as outlined, with any highlights or additions as noted above.    Total time of the encounter was ?80 minutes, of which >50% was spent reviewing the chart, updating orders, coordinating care, and at the bedside counseling. I performed the substantive  portion of the visit by providing more than 50% of the total encounter time, and spent ?45 minutes. Patient was also seen by Kinnie Feil, PA-C who spent ?35 minutes.       Assessment:   66 y.o. female with hx of AFIB on Eliquis, HTN, HLD, DM, COPD, TIA, bipolar d/o, bipolar d/o recent rhabdomyolysis ([facility] admit 2/7-2/17/23) presented to [facility] c/o b/l leg weakness, arm/face paresthesia. Presentation most consistent with peripheral neuropathy, possible etiologies include nutritional deficiency vs inflammatory vs other.    Plan:   -Added labs including: motor and sensory neuropathy eval, syphillis, B6, B12, TSH, ANA, Crp, copper, Zinc, HIV, B1. prior CSF cx in process  -Thiamine repletion with 500 mg IV q8h x 3 days followed by  250 mg IV daily x 3 days followed by 100 mg po daily.   -consider IVIG pending clinical outcome after thiamine infusion.  -PT/OT  -possible EMG.    HPI   [name] is a 66 y.o. female with hx of AFIB on Eliquis, HTN, HLD, DM, COPD, TIA, bipolar d/o, bipolar d/o recent rhabdomyolysis ([facility] admit 2/7-2/17/23) presented to [facility] c/o b/l leg weakness, onset about 2 days ago. She has had admission in [facility] hospital about a month ago and at that time pt noted associated arm and face numbness. Pt states she feels like a belt tightening around her torso. Pt c/o mild-mod lower back pain but denies urinary/bowel incontinence, saddle anesthesia. She endorses global headache,  describes as tightness, not associated with photo/phonophobia, n/v. Pt has been evaluated by neurologist at [facility], went under MRI brain, C/T/L spine and LP. CSF c/w protein= 52.0. she received one dose of IVIG at [facility], and transferred to [facility] for further work up and EMG. Pt denies acute confusion, tremor, vision /speech change, n/v, chills, SOB, CP, cough. Pt's conditions are mod, present prior to admission, not modified by external factor.    Past Medical Hx     Past Medical History:   Diagnosis Date    Anemia     Asthma      Atrial fibrillation     Chronic obstructive pulmonary disease     Convulsions     Depression     Diabetes mellitus     diet controlled     Hyperlipidemia     Hypertension     TIA (transient ischemic attack)           Past Surgical Hx:     Past Surgical History:   Procedure Laterality Date    CARDIAC CATHETERIZATION  2017    HYSTERECTOMY      pci          Family Medical History:      Family History   Problem Relation Age of Onset    Stroke Maternal Grandmother        Social Hx     Social History     Socioeconomic History    Marital status: Divorced   Tobacco Use    Smoking status: Never    Smokeless tobacco: Never   Vaping Use    Vaping Use: Never used   Substance and Sexual Activity    Alcohol use: Never    Drug use: Never   Other Topics Concern    Acutrim No    Byetta No    Contrave No    Dexatrim No    Diethylpropion No    Fastin No    Fen - Phen No    Ionamin / Adipex No    Phentermine No    Qsymia No    Prozac No    Saxenda No    Topamax No    Wellbutrin No    Xenical (Orlistat, Alli) No    Other Med No     Social Determinants of Psychologist, prison and probation services Strain: Low Risk     Difficulty of Paying Living Expenses: Not hard at all   Food Insecurity: No Food Insecurity    Worried About Programme researcher, broadcasting/film/video in the Last Year: Never true    Barista in the Last Year: Never true   Transportation Needs: Unmet Transportation Needs    Lack of Transportation (Medical): Yes    Lack of Transportation (Non-Medical): Yes   Physical Activity: Insufficiently Active    Days of Exercise per Week: 2 days    Minutes of Exercise per Session: 20 min   Stress: Stress Concern Present    Feeling of Stress : To some extent   Social Connections: Moderately Integrated    Frequency of Communication with Friends and Family: More than three times a week    Frequency of Social Gatherings with Friends and Family: Twice a week    Attends Religious Services: More than 4 times per year    Active Member of [employer] or Organizations: Yes     Attends Engineer, structural: More than 4 times per year    Marital Status: Divorced  Intimate Partner Violence: Not At Risk    Fear of Current or Ex-Partner: No    Emotionally Abused: No    Physically Abused: No    Sexually Abused: No       Meds     Home :   Prior to Admission medications    Medication Sig Start Date End Date Taking? Authorizing Provider   acetaminophen (TYLENOL) 325 MG tablet Take 2 tablets (650 mg total) by mouth every 4 (four) hours as needed for Pain 08/29/18   Kolycheva, Galina N, DO   albuterol (PROVENTIL HFA) 108 (90 Base) MCG/ACT inhaler Inhale 1 puff into the lungs daily Also take it PRN every 20 minutes.    [provider]   amLODIPine (NORVASC) 2.5 MG tablet Take 1 tablet (2.5 mg total) by mouth daily 01/10/19   Mahlon Gammon, Belva Crome, MD   apixaban (ELIQUIS) 5 MG Take 1 tablet (5 mg total) by mouth every 12 (twelve) hours 01/09/19   Mahlon Gammon, Belva Crome, MD   atorvastatin (LIPITOR) 40 MG tablet Take 40 mg by mouth daily    [provider]   budesonide-formoterol (SYMBICORT) 80-4.5 MCG/ACT inhaler Inhale 2 puffs into the lungs daily    [provider]   butalbital-acetaminophen-caffeine (FIORICET) 50-325-40 MG per tablet Take 1 tablet by mouth every 6 (six) hours as needed for Headaches 09/10/20   Dot Been, MD   furosemide (LASIX) 20 MG tablet Take 1 tablet (20 mg total) by mouth daily  Patient taking differently: Take 20 mg by mouth 2 (two) times daily Patient states she takes lasix every other day   08/30/18   Earl Many, DO   furosemide (LASIX) 40 MG tablet Take 40 mg by mouth 2 (two) times daily 10/28/21   [provider]   lamoTRIgine (LAMICTAL) 150 MG tablet Take 1 tablet (150 mg total) by mouth 2 (two) times daily  Patient taking differently: Take 150 mg by mouth daily    08/29/18   Earl Many, DO   Latuda 120 MG Tab Take 120 mg by mouth daily 12/06/21   [provider]   LORazepam (Ativan) 0.5 MG tablet  Take 0.5 mg by mouth daily as needed for Anxiety    [provider]   Lurasidone HCl (Latuda) 40 MG Tab Take 40 mg by mouth nightly    [provider]   metoprolol succinate XL (TOPROL-XL) 25 MG 24 hr tablet Take 25 mg by mouth daily    [provider]   Motegrity 2 MG Tab Take 2 mg by mouth daily 11/30/21   [provider]   Ozempic, 1 MG/DOSE, 4 MG/3ML Solution Pen-injector  11/29/21   [provider]   pantoprazole (PROTONIX) 40 MG tablet Take 1 tablet (40 mg total) by mouth daily 08/29/18   Earl Many, DO   QUEtiapine (SEROQUEL) 50 MG tablet Take 1 tablet (50 mg total) by mouth nightly 08/29/18   Earl Many, DO   rOPINIRole (REQUIP) 1 MG tablet Take 1 tablet (1 mg total) by mouth nightly 08/29/18   Earl Many, DO   terazosin (HYTRIN) 2 MG capsule Take 1 capsule (2 mg total) by mouth nightly 06/17/19   Aretha Parrot, MD   Trintellix 10 MG Tab tablet Take 10 mg by mouth daily 11/29/21   [provider]      Inpatient :   Current Facility-Administered Medications   Medication Dose Route Frequency    amLODIPine  2.5  mg Oral Daily    [START ON 12/26/2021] apixaban  5 mg Oral Q12H SCH    budesonide-formoterol  2 puff Inhalation BID    lurasidone  120 mg Oral Daily    metoprolol succinate XL  25 mg Oral Daily    pantoprazole  40 mg Oral Daily    rOPINIRole  1 mg Oral QHS    terazosin  2 mg Oral QHS    thiamine (VITAMIN B-1) IVPB  500 mg Intravenous Q8H    Followed by    Melene Muller ON 12/28/2021] thiamine (VITAMIN B-1) IVPB  250 mg Intravenous Q24H SCH    Followed by    Melene Muller ON 12/31/2021] thiamine  100 mg Oral Daily         Allergies    Singulair [montelukast], Myoview [technetium-34m], Contrast [iodinated contrast media], Latex, Nitroglycerin, Penicillins, and Tetracyclines & related      Review of Systems     All other systems were reviewed and are negative except for that mentioned in the HPI    Physical Exam:   Temp:  [98.1 F (36.7 C)-99.6 F  (37.6 C)] 98.8 F (37.1 C)  Heart Rate:  [94-121] 105  Resp Rate:  [13-20] 17  BP: (154-192)/(74-89) 154/87     Vital Signs:  Reviewed    General: Well developed and well nourished. No acute distress. Cooperative with the exam  ENT: Normal oral mucosa, no ear or nose discharge  Neck: Symmetric, no deformities  CV: RRR  Resp: No audible wheezing, normal work of breathing  Abd: Soft, nondistended  Skin: Intact, extremities normal in color  Psych: Affect is normal, good insight    Mental Status: The patient is awake, alert and oriented to person, place, and time.  Affect is normal  Fund of knowledge appropriate  Recent and remote memory are intact   Attention span and concentration appear normal.  Language function is normal. There is no evidence of aphasia in conversational speech.    Cranial nerves:   -CN II: Visual fields full to bedside confrontation   -CN III, IV, VI: Pupils equal, round, and reactive to light; extraocular movements intact; no ptosis              -CN V: Facial sensation intact in V1 through V3 distributions   -CN VII: Face symmetric   -CN VIII: Hearing intact to conversational speech   -CN IX, X: Palate elevates symmetrically; normal phonation   -CN XI: Symmetric full strength of sternocleidomastoid and trapezius muscles   -CN XII: Tongue protrudes midline    Motor: Muscle tone normal without spasticity or flaccidity. No atrophy.      UEs:   Deltoid Bicep Tricep WE WF Grip IO   Right 5 5 5 5 5 5     Left 5 5 5 5 5 5       LEs   HF HE KF KE PF DF   Right 0 0 0 0 5 5   Left 0 0 0 0 5 5     Sensory:   Light touch and temperature diminished on Left arm and leg.       Reflexes:      B T BR P A   Right 0 0 0 0 0   Left 0 0 0 0 0     Plantars: equivocal    Coordination: FTN intact. No tremors    Gait: deferred        Labs:     Results  Procedure Component Value Units Date/Time    Creatine Kinase (CK) [[id number]]  (Abnormal) Collected: 12/25/21 0513    Specimen: Blood Updated: 12/25/21 0602      Creatine Kinase (CK) 239 U/L             Rads:     Results for orders placed or performed during the hospital encounter of 12/24/21   MRI Brain WO Contrast    Narrative    Clinical History:    lower extremity weakness    Technique:    MRI BRAIN WO CONTRAST MRI of the brain was performed without contrast as  per departmental protocol. Planar reformatted images were submitted for  review.    Comparison:    MRI of the brain dated 06/16/2019    Findings:    The gyri and sulci are within normal limits. There is no intra or  extra-axial fluid collection, midline shift or mass effect. There is a  cable septum pellucidum and cavum verge. There is no hydrocephalus.  There are patchy and punctate areas of T2/FLAIR hyperintensity within  the periventricular and subcortical white matter bilaterally, not  significantly changed from the prior study. There are no areas of  restricted diffusion. The basal cisterns are patent.    The regions of the sella, pineal gland, and craniocervical junction are  unremarkable. Flow-voids are identified within the vessels of the skull  base. The globes and orbits are unremarkable. The paranasal sinuses and  mastoid air cells are clear. The scalp and calvarium are unremarkable.      Impression    No acute intracranial abnormality.    Mild chronic microvascular ischemic changes.    Cavum septum pellucidum and cavum vergae.    Alric Seton MD, MD   12/25/2021 2:47 AM   CT Head WO Contrast    Narrative    CT HEAD WO CONTRAST    CLINICAL INDICATION:   weakness in lower extremities, drooling, change  in speech    COMPARISON: 06/23/2019    TECHNIQUE: 5 mm axial images from the skull base to the vertex. The  following  dose reduction techniques were utilized: automated exposure  control and/or adjustment of the mA and/or kV according to patient size,  and the use of iterative reconstruction technique.    FINDINGS:     The ventricles, cisterns, and sulci appear within normal size limits.  There is a cavum  vergae again noted. There is no mass effect or midline  shift. There is no hemorrhage or abnormal extra-axial fluid collection.  The gray-white differentiation appears maintained. Ill-defined white  matter hypodensities are nonspecific but likely represent microvascular  ischemic changes.  Bone windows demonstrate no evidence for acute  osseous abnormality. The included paranasal sinuses and mastoid air  cells appear clear.      Impression     No acute intracranial process.    Sandie Ano, MD   12/24/2021 8:17 PM   Results for orders placed or performed during the hospital encounter of 06/16/19   MRI Brain W WO Contrast    Narrative    INDICATION: Neuro deficit, acute, stroke suspected. Left-sided weakness.  Left-sided facial droop.    TECHNIQUE: Multiplanar, multisequence MR imaging of the brain was  performed before and after 10 mL Gadavist IV contrast administration.     COMPARISON: 05/20/2019.    FINDINGS: Exam is limited by artifact related to patient motion.    BRAIN: Cavum septum pellucidum is noted. Scattered foci of T2  prolongation demonstrated  within the periventricular, deep, and  subcortical white matter. The ventricles and sulci are normal in size  and configuration. There is no acute infarct or intracerebral  hemorrhage. No extra-axial blood or fluid collection is present. No  intracranial mass is identified. There is no midline shift or edema. No  abnormal enhancement appreciated within the limitations of the exam. No  paranasal sinus air fluid levels demonstrated.        Impression     Motion limited exam. No evidence of acute intracranial  abnormality.      Moderate diffuse white matter disease, nonspecific but most often  reflects chronic small vessel ischemic changes.    Gustavus Messing, MD   06/16/2019 6:52 PM   MR Angiogram Head WO Contrast    Narrative    INDICATION: Neuro deficit, acute, stroke suspected    TECHNIQUE: Time of flight MRA of the circle of Willis was  performed      FINDINGS:    MRA:    Right ACA: No evidence of high grade stenosis or occlusion.   Right MCA: No evidence of high grade stenosis or occlusion.   Right PCA: No evidence of high grade stenosis or occlusion.     Left ACA: No evidence of high grade stenosis or occlusion.   Left MCA: No evidence of high grade stenosis or occlusion.   Left PCA: No evidence of high grade stenosis or occlusion.    No aneurysm identified.      Impression     No evidence of significant stenosis within the circle of  Willis.      END OF IMPRESSION       Gustavus Messing, MD   06/16/2019 6:48 PM       Alvis Lemmings  [facility]  Spectra link:[phone]  After 5:00 pm: [phone]  Please see attending neurology note that follows this mid-level encounter note.

## 2021-12-25 NOTE — Plan of Care (Signed)
Pt rested in bed and worked with OT. Pt unsafe to move to chair. Pt c/o pain in her neck, chest, and back rating it a 9/10 with mild relief from pain meds.pt seen by Neurology and labs ordered and IV thiamine given. Pt has a facial droop, with a left arm drift and unable to lift bilateral legs off of bed. Pt does have muscle flicker in BLE. Pt has wounds to her peri area, buttocks, posterior upper thigh folds and outer sides of her labia. Barrier cream applied. Pt educated on the need to change briefs at home as soon as they are wet to help prevent skin breakdown.         Problem: Moderate/High Fall Risk Score >5  Goal: Patient will remain free of falls  Outcome: Progressing     Problem: Compromised Tissue integrity  Goal: Damaged tissue is healing and protected  Outcome: Progressing  Goal: Nutritional status is improving  Outcome: Progressing

## 2021-12-25 NOTE — ED Notes (Signed)
[facility] EMERGENCY DEPT  ED NURSING NOTE FOR THE RECEIVING INPATIENT NURSE   ED [place] RN   [facility] [id number]   ED CHARGE RN [phone]   ADMISSION INFORMATION   [name] is a 66 y.o. female admitted with an ED diagnosis of:    1. Weakness of both legs         Isolation: None   Allergies: Singulair [montelukast], Myoview [technetium-23m], Contrast [iodinated contrast media], Latex, Nitroglycerin, Penicillins, and Tetracyclines & related   Holding Orders confirmed? No   Belongings Documented? No   Home medications sent to pharmacy confirmed? No   NURSING CARE   Patient Comes From:   Mental Status: Home/Family Care  alert and oriented   ADL: Needs assistance with ADLs   Ambulation: Unable to assess   Pertinent Information  and Safety Concerns:     Broset Violence Risk Level: Low Pt here for weakness in extremities progressing for last three days. Unable to lift arms and legs during assessment. Recent hx of Rhabdo. NAD. On RA.      CT / NIH   CT Head ordered on this patient?  Yes   NIH/Dysphagia assessment done prior to admission? No   VITAL SIGNS (at the time of this note)      Vitals:    12/25/21 0452   BP: 175/84   Pulse: (!) 107   Resp: 16   Temp: 98.7 F (37.1 C)   SpO2: 98%             Labs Reviewed   CK      No orders to display

## 2021-12-25 NOTE — OT Eval Note (Signed)
Occupational Therapy Eval [name]        Post Acute Care Therapy Recommendations:     Discharge Recommendations:  Acute Rehab    If Acute Rehab  recommended discharge disposition is not available, patient will need max assist for ADLs/transfers and HHOT.     [facility]    Therapy discharge recommendations may change with patient status.  Please refer to most recent note for up-to-date recommendations.    Patient anticipated to benefit from and to be able to engage in 3 hours of therapy a day for 5 days a week.     Assessment:   Significant Findings: None    [name] is a 66 y.o. female admitted 12/25/2021.  Patient presents with decreased strength (LE>UE weakness), LE sensory deficits, visual deficits, decreased proprioception, balance deficits, and decreased ADL independence. Pt reports prior to symptom onset she was independent with ADLs and mobility without assistive devices. She currently requires min-modA for bed mobility, maxAx2 for sit to stand transfers, and min-maxA for ADL care. Educated on importance of visual compensation to accommodate for decreased LE sensation re: positioning in bed. Pt will benefit from ongoing skilled OT to maximize functional independence and address deficits.      Therapy Diagnosis: decreased ADL independence    Rehabilitation Potential: good with continued therapy    Treatment Activities: evaluation, education, transfers, HEP  Educated the patient to role of occupational therapy, plan of care, goals of therapy and HEP, safety with mobility and ADLs, home safety.    Plan:   OT Frequency Recommended: 4-5x/wk     Treatment/Interventions: ADL retraining, functional transfers/mobility, strengthening, balance training, activity tolerance, education, safety training    Risks/benefits/POC discussed with patient      [facility]  Bed: F740/F740.01        Precautions and  Contraindications:   Falls, decreased sensation    Consult received for [name] for OT Evaluation and Treatment.  Patient's medical condition is appropriate for Occupational Therapy intervention at this time.    History of Present Illness:    [name] is a 66 y.o. female admitted on 12/25/2021 with difficulty standing and walking, bilateral leg/hand weakness, bilateral leg/hand/face paresthesias.    Admitting Diagnosis: Weakness of both legs [R29.898]    Past Medical/Surgical History:  Past Medical History:   Diagnosis Date    Anemia     Asthma     Atrial fibrillation     Chronic obstructive pulmonary disease     Convulsions     Depression     Diabetes mellitus     diet controlled     Hyperlipidemia     Hypertension     TIA (transient ischemic attack)      Past Surgical History:   Procedure Laterality Date    CARDIAC CATHETERIZATION  2017    HYSTERECTOMY      pci         Imaging/Tests/Labs:  CT Head WO Contrast    Result Date: 12/24/2021   No acute intracranial process. Sandie AnoAakash Ahuja, MD  12/24/2021 8:17 PM    MRI Brain WO Contrast    Result Date: 12/25/2021  No acute intracranial abnormality. Mild chronic microvascular ischemic changes. Cavum septum pellucidum and cavum vergae. Alric Setonharles C Hoo MD, MD  12/25/2021 2:47 AM    MRI Cervical Spine WO Contrast    Result Date: 12/25/2021  No cord compression or intrinsic cord  signal abnormality. Multilevel degenerative changes as described above, with varying degrees of neural foraminal narrowing, and no central canal stenosis. Alric Seton MD, MD  12/25/2021 2:53 AM    MRI Thoracic Spine WO Contrast    Result Date: 12/25/2021  No cord compression or intrinsic cord signal abnormality. Minimal degenerative changes as described above. Alric Seton MD, MD  12/25/2021 2:56 AM    MRI Lumbar Spine WO Contrast    Result Date: 12/25/2021  1. 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 Orson Gear, MD  12/25/2021 2:54 AM    Chest AP  Portable    Result Date: 12/24/2021   No acute pulmonary or pleural disease. Sandie Ano, MD  12/24/2021 7:14 PM     Social History:   Prior Level of Function: independent ADLs, ambulates independently  Assistive Devices: SPC as needed, shower chair   Baseline Activity: community ambulator   DME Currently at Home: SPC, shower chair, RW  Home Living Arrangements: with daughter   Type of Home: house  Home Layout: 2 STE, multi level home     Subjective: "I haven't walked in about 5 days"    Patient is agreeable to participation in the therapy session. Nursing clears patient for therapy.     Patient Goal: to feel better  Pain:   Denies     Objective:   Patient is in bed with telemetry in place.  Pt wore mask during therapy session:No      Cognitive Status and Neuro Exam:  A/Ox3  Answers questions appropriately: yes   Command following: good  Some delayed processing, but can converse appropriately     Musculoskeletal Examination  RUE ROM: WFL  LUE ROM: WFL  RLE ROM: decreased hip flexion, knee flexion/extension  LLE ROM: decreased hip flexion, knee flexion/extension    RUE Strength: 3+/5 shoulder, 4-/5 elbow/grip   LUE Strength: 3+/5 shoulder, 3+/5 elbow, 4-/5 grip   RLE Strength: hip grossly 2+/5, knee/ankle grossly 3+/5  LLE Strength: hip 2+/5, knee/ankle grossly 3 to 3+/5    Tone: n/a    Coordination:  Digit opposition: mildly impaired bilaterally  FTN: mild dysmetria LUE, slowed RUE  RAM: grossly intact    Sensory/Oculomotor Examination  Auditory: intact  Tactile: intact in BUE, endorses numbness/tingling in B hands; impaired throughout BLE, diminished to light touch, impaired proprioception in B feet    Vision  Tracking: intact, decreased tolerance tracking towards L, +nystagmus at L end range (endorses diplopia)  EOM: intact  Visual Fields: grossly intact- needs further assessment, pt slightly inconsistent on testing   Saccades: impaired fixation to L  Wears glasses: reading glasses  Other visual impairment:  n/a    Activities of Daily Living  Eating: independent/setup  Grooming: setup from seated level   Bathing: modA from seated   UE Dressing: SBA  LE Dressing: maxA  Toileting: maxA    Functional Mobility:  Rolling: min-modA  Supine to Sit: min-modA for LE management  Sit to Supine: modA for LE management  Sit to Stand: maxAx2  Transfers: NT  Functional Mobility: NT    PMP Activity: Step 4 - Dangle at Bedside      Balance  Static Sitting: fair+ with eyes open, fair/fair- when eyes closed   Dynamic Sitting: fair  Static Standing: fair with 2 person support  Dynamic Standing: fair-/poor+ taking side steps     Participation and Activity Tolerance  Participation Effort: good  Endurance: good  Patient left with call bell within reach, all needs met, SCDs off as found, fall mat in place, bed alarm on, chair alarm n/a and all questions answered. RN notified of session outcome and patient response.       Goals:  Time For Goal Achievement: 7 visits  ADL Goals  Patient will groom self: Minimal Assist, at sinkside, 7 visits  Patient will dress lower body: Minimal Assist, with AE, 7 visits  Mobility and Transfer Goals  Pt will perform functional transfers: Minimal Assist, with rolling walker, 7 visits  Neuro Re-Ed Goals  Pt will perform dynamic sitting balance: Supervision, to increase ability to complete ADLs, 7 visits  Other Goal: pt will demonstrate use of visual compensatory methods 2/2 decreased LE sensation           Vision Goals  Pt with diplopia will complete ADLs with adaptive equipment: modified independent, to locate items within environment for ADLs            PPE worn during session: procedural mask and gloves    Tech present: Mariam  PPE worn by tech: procedural mask and gloves      Enid Derry, OTR/L  Pager [phone]      Time of treatment:   OT Received On: 12/25/21  Start Time: 1045  Stop Time: 1115  Time Calculation (min): 30 min

## 2021-12-25 NOTE — Progress Notes (Signed)
Pt arrived to floor @0630 .  CHG bath completed.  4eyes skin assessment completed w/ Teena RN  Oriented to floor, Call light in reach, bedside table in reach.  Fall mats in place, bed alarm on.    4 eyes in 4 hours pressure injury assessment note:      Completed with:   Unit & Time admitted:              Bony Prominences: Check appropriate box; if wound is present enter wound assessment in LDA     Occiput:                 [x] WNL  []  Wound present  Face:                     [x] WNL  []  Wound present  Ears:                      [x] WNL  []  Wound present  Spine:                    [x] WNL  []  Wound present  Shoulders:             [x] WNL  []  Wound present  Elbows:                  [x] WNL  []  Wound present  Sacrum/coccyx:     [x] WNL  []  Wound present  Ischial Tuberosity:  [x] WNL  []  Wound present  Trochanter/Hip:      [x] WNL  []  Wound present  Knees:                   [x] WNL  []  Wound present  Ankles:                   [x] WNL  []  Wound present  Heels:                    [x] WNL  []  Wound present  Other pressure areas:  [x]  Wound location  Inner thighs/peri area     Device related: []  Device name:         Consult WOCN ordered    Other skin related issues, ie tears, rash, etc, document in Integumentary flowsheet  Scar on chest, scattered bruising

## 2021-12-25 NOTE — H&P (Addendum)
[facility] HOSPITALIST ADMISSION HISTORY AND PHYSICAL EXAM    Date Time: 12/25/21 6:30 AM  Patient Name: [name],[name]  Attending Physician: Emeline Darling, MD  Primary Care Physician: Malcolm Metro, MD    CC: difficulty standing and walking, bilateral leg/hand weakness, bilateral leg/hand/face paresthesias      History of Presenting Illness:   [name] is a 66 y.o. female hx afib on Eliquis, HTN, HLD, DM, COPD, TIAs, COPD, bipolar disorder, [facility] admit 2/7-2/17/23 for rhabdomyolysis (peak CK 4464 - statin d/c'ed) + vomiting ?gastroparesis + starvation ketoacidosis + UTI, [facility] admit 2/18-2/20/23 for rhabdomyolysis (peak CK 2509) + transaminitis + bilateral leg weakness + right paresthesias, [facility] ER visit 3/2 for bilateral leg weakness who presents to [facility] with difficulty standing and walking, bilateral leg/hand weakness, bilateral leg/hand/face paresthesias. She noted bilateral leg weakness with her prior [facility] admission 2/7-2/17 and was readmitted for bilateral leg weakness 2/18-2/20. The past 4 days she has had difficulty standing and walking. The past 4 days she notes new bilateral hand and face paresthesias and bilateral hand weakness. She has some shortness of breath but is speaking in full sentences. She went to [facility] ER 3/2 and was discharged. She saw her PMD who advised she go to an [facility]. At [facility] she got MRI brain and CTL-spine which did not show a cause for her symptoms. [facility] called neurology Dr. Francesco Sor who said to consider IVIG for 5 days, LP, EMG. The LP was done at [facility]. Dr. Francesco Sor advised transfer to [facility]. These symptoms are sudden onset, moderate intensity, without alleviating factors.    Past Medical History:     Past Medical History:   Diagnosis Date    Anemia     Asthma     Atrial fibrillation     Chronic obstructive pulmonary disease     Convulsions     Depression     Diabetes mellitus     diet controlled     Hyperlipidemia     Hypertension     TIA (transient ischemic attack)         Past Surgical History:     Past Surgical History:   Procedure Laterality Date    CARDIAC CATHETERIZATION  2017    HYSTERECTOMY      pci         Family History:     Family History   Problem Relation Age of Onset    Stroke Maternal Grandmother        Social History:     Social History     Socioeconomic History    Marital status: Divorced     Spouse name: Not on file    Number of children: Not on file    Years of education: Not on file    Highest education level: Not on file   Occupational History    Not on file   Tobacco Use    Smoking status: Never    Smokeless tobacco: Never   Vaping Use    Vaping Use: Never used   Substance and Sexual Activity    Alcohol use: Never    Drug use: Never    Sexual activity: Not on file   Other Topics Concern    Dietary supplements / vitamins Not Asked    Anesthesia problems Not Asked    Blood thinners Not Asked    Pregnant Not Asked    Future Children Not Asked    Number of Pregnancies? Not Asked    Number of children  Not Asked    Miscarriages / Abortions? Not Asked    Eats large amounts Not Asked    Excessive Sweets Not Asked    Skips meals Not Asked    Eats excessive starches Not Asked    Snacks or grazes Not Asked    Emotional eater Not Asked    Eats fried food Not Asked    Eats fast food Not Asked    Diet Center Not Asked    Doylene Bode Not Asked    LA Weight Loss Not Asked    Nutri-System Not Asked    Opti-Fast / Medi-Fast Not Asked    Overeaters Anonymous Not Asked    [facility] Not Asked    TOPS Not Asked    Weight Watchers Not Asked    Atkins Not Asked    Binging / Purging Not Asked    Calorie Counting Not Asked    Fasting Not Asked    High Protein Not Asked    Low Carb Not Asked    Low Fat Not Asked    [facility] Diet Not Asked    Slim Fast Not Asked    [facility] Not Asked    Stationary cycle or treadmill Not Asked    Gym/fitness Classes Not Asked    Home exercise/video Not Asked    Swimming Not Asked    Weight training Not Asked    Walking or  running Not Asked    Hospitalization Not Asked    Hypnosis Not Asked    Physical therapy Not Asked    Psychological therapy Not Asked    Residential program Not Asked    Acutrim No    Byetta No    Contrave No    Dexatrim No    Diethylpropion No    Fastin No    Fen - Phen No    Ionamin / Adipex No    Phentermine No    Qsymia No    Prozac No    Saxenda No    Topamax No    Wellbutrin No    Xenical (Orlistat, Alli) No    Other Med No    No impairment Not Asked    Walks with cane/crutch Not Asked    Requires a wheelchair Not Asked    Bedridden Not Asked    Are you currently being treated for depression? Not Asked    Do you snore? Not Asked    Are you receiving any medical or psychological services? Not Asked    Do you ever wake up at night gasping for breath? Not Asked    Do you have or have you been treated for an eating disorder? Not Asked    Anyone ever told you that you stop breathing while asleep? Not Asked    Do you exercise regularly? Not Asked    Have you or family member ever have trouble with anesthesia? Not Asked   Social History Narrative    Not on file     Social Determinants of Health     Financial Resource Strain: Low Risk     Difficulty of Paying Living Expenses: Not hard at all   Food Insecurity: No Food Insecurity    Worried About Programme researcher, broadcasting/film/video in the Last Year: Never true    Barista in the Last Year: Never true   Transportation Needs: Unmet Transportation Needs    Lack of Transportation (Medical): Yes    Lack of  Transportation (Non-Medical): Yes   Physical Activity: Insufficiently Active    Days of Exercise per Week: 2 days    Minutes of Exercise per Session: 20 min   Stress: Stress Concern Present    Feeling of Stress : To some extent   Social Connections: Moderately Integrated    Frequency of Communication with Friends and Family: More than three times a week    Frequency of Social Gatherings with Friends and Family: Twice a week    Attends Religious Services: More than 4 times per year     Active Member of [employer] Financial[employer]ubs or Organizations: Yes    Attends Engineer, structuralClub or Organization Meetings: More than 4 times per year    Marital Status: Divorced   Catering managerntimate Partner Violence: Not At Risk    Fear of Current or Ex-Partner: No    Emotionally Abused: No    Physically Abused: No    Sexually Abused: No   Housing Stability: Not on file       Allergies:     Allergies   Allergen Reactions    Singulair [Montelukast] Itching    Myoview [Technetium-4641m] Itching    Contrast [Iodinated Contrast Media]     Latex     Nitroglycerin     Penicillins     Tetracyclines & Related        Medications:     Prior to Admission medications    Medication Sig Start Date End Date Taking? Authorizing Provider   acetaminophen (TYLENOL) 325 MG tablet Take 2 tablets (650 mg total) by mouth every 4 (four) hours as needed for Pain 08/29/18   Kolycheva, Galina N, DO   albuterol (PROVENTIL HFA) 108 (90 Base) MCG/ACT inhaler Inhale 1 puff into the lungs daily Also take it PRN every 20 minutes.    [provider]   amLODIPine (NORVASC) 2.5 MG tablet Take 1 tablet (2.5 mg total) by mouth daily 01/10/19   Mahlon Gammonahmanian Shahri, Belva CromeMajid, MD   apixaban (ELIQUIS) 5 MG Take 1 tablet (5 mg total) by mouth every 12 (twelve) hours 01/09/19   Yates Decampahmanian Shahri, Majid, MD   atorvastatin (LIPITOR) 40 MG tablet Take 40 mg by mouth daily    [provider]   budesonide-formoterol (SYMBICORT) 80-4.5 MCG/ACT inhaler Inhale 2 puffs into the lungs daily    [provider]   butalbital-acetaminophen-caffeine (FIORICET) 50-325-40 MG per tablet Take 1 tablet by mouth every 6 (six) hours as needed for Headaches 09/10/20   Dot Beenhen, Steven B, MD   furosemide (LASIX) 20 MG tablet Take 1 tablet (20 mg total) by mouth daily  Patient taking differently: Take 20 mg by mouth 2 (two) times daily Patient states she takes lasix every other day   08/30/18   Earl ManyKolycheva, Galina N, DO   lamoTRIgine (LAMICTAL) 150 MG tablet Take 1 tablet (150 mg total) by mouth 2 (two) times  daily  Patient taking differently: Take 150 mg by mouth daily    08/29/18   Earl ManyKolycheva, Galina N, DO   LORazepam (Ativan) 0.5 MG tablet Take 0.5 mg by mouth daily as needed for Anxiety    [provider]   Lurasidone HCl (Latuda) 40 MG Tab Take 40 mg by mouth nightly    [provider]   metoprolol succinate XL (TOPROL-XL) 25 MG 24 hr tablet Take 25 mg by mouth daily    [provider]   pantoprazole (PROTONIX) 40 MG tablet Take 1 tablet (40 mg total) by mouth daily 08/29/18   Earl ManyKolycheva, Galina N,  DO   QUEtiapine (SEROQUEL) 50 MG tablet Take 1 tablet (50 mg total) by mouth nightly 08/29/18   Hendricks Limes N, DO   rOPINIRole (REQUIP) 1 MG tablet Take 1 tablet (1 mg total) by mouth nightly 08/29/18   Earl Many, DO   terazosin (HYTRIN) 2 MG capsule Take 1 capsule (2 mg total) by mouth nightly 06/17/19   Pryor Montes B, MD       Review of Systems:   All other systems were reviewed and are negative except: as above    Physical Exam:     Vitals:    12/25/21 0545   BP: 161/76   Pulse: (!) 102   Resp: 15   Temp:    SpO2: 98%       Intake and Output Summary (Last 24 hours) at Date Time  No intake or output data in the 24 hours ending 12/25/21 0630    General: awake, alert, oriented x 3; no acute distress.  HEENT: perrla, eomi, sclera anicteric  oropharynx clear without lesions, mucous membranes moist  Neck: supple, no lymphadenopathy, no thyromegaly, no JVD, no carotid bruits  Cardiovascular: regular rate and rhythm, no murmurs, rubs or gallops  Lungs: clear to auscultation bilaterally, without wheezing, rhonchi, or rales  Abdomen: soft, non-tender, non-distended; no palpable masses, no hepatosplenomegaly, normoactive bowel sounds, no rebound or guarding  Extremities: no clubbing, cyanosis, or edema  Neuro: cranial nerves grossly intact, strength 5/5 in upper extremities except 4+/5 hand intrinsics, bilateral hip extension 2/5, bilateral knee extension 3/5, bilateral dorsiflexion 3/5,  bilateral plantarflexion 4/5, paresthesias bilateral feet/hands/face but intact to light touch, could not elicit knee DTRs, follows commands  Skin: no rashes or lesions noted      Labs:     Results       Procedure Component Value Units Date/Time    Creatine Kinase (CK) [[id number]]  (Abnormal) Collected: 12/25/21 0513    Specimen: Blood Updated: 12/25/21 0602     Creatine Kinase (CK) 239 U/L             Radiology Results (24 Hour)       ** No results found for the last 24 hours. **              Assessment:     Patient Active Problem List   Diagnosis    CVA (cerebral vascular accident)    Chest pain    Chest pain with high risk of acute coronary syndrome    Paroxysmal atrial fibrillation    Hypertension    Hyperlipidemia    Seizure disorder    History of gastrointestinal hemorrhage    Depression    Anemia    Asthma    Non-traumatic subcutaneous emphysema    Chest pain with moderate risk for cardiac etiology    Left-sided weakness    History of asthma    History of transient ischemic attack (TIA)    Type 2 diabetes mellitus    Syncope and collapse    Weakness of both legs       66 y.o. female hx afib on Eliquis, HTN, HLD, DM, COPD, TIAs, COPD, bipolar disorder, [facility] admit 2/7-2/17/23 for rhabdomyolysis (peak CK 4464 - statin d/c'ed) + vomiting ?gastroparesis + starvation ketoacidosis + UTI, [facility] admit 2/18-2/20/23 for rhabdomyolysis (peak CK 2509) + transaminitis + bilateral leg weakness + right paresthesias, [facility] ER visit 3/2 for bilateral leg weakness who presents to [facility] with difficulty standing and walking, bilateral leg/hand weakness, bilateral leg/hand/face paresthesias.  Plan:   Difficulty standing and walking, bilateral leg/hand weakness, bilateral leg/hand/face paresthesias - Transferred over at Dr. Chrisandra Carota request perhaps for EMG. Guillain-Barre is a consideration. She got a dose of IVIG at [facility]. Defer additional IVIG and EMG to neurology. With rhabdomyolysis polymyositis is a consideration but  her CK is improved now so less likely. Neuro checks. PT/OT. NIF/VC. Please note I left a message for [facility] neurology [phone]) at 7:50 for consultation.    Hx afib on Eliquis, HTN, HLD, DM, COPD, TIAs, COPD, bipolar disorder, [facility] admit 2/7-2/17/23 for rhabdomyolysis (peak CK 4464 - statin d/c'ed) + vomiting ?gastroparesis + starvation ketoacidosis + UTI, [facility] admit 2/18-2/20/23 for rhabdomyolysis (peak CK 2509) + transaminitis + bilateral leg weakness + right paresthesias, [facility] ER visit 3/2 for bilateral leg weakness - Noted.     DVT/GI prophylaxis - SCDs. Hold Eliquis for 24 hours given LP.    Status/Rationale: Inpatient neurotelemetry.     Signed by: Emeline Darling, MD, MD   cc:Malcolm Metro, MD

## 2021-12-25 NOTE — ED Provider Notes (Signed)
Case turned over to me pending discussion with [facility] for transfer for higher level of care  115A Case discussed with Neurologist [facility] Dr Becky Sax who accepts and recommends ED to ED transfer for eval  120A Case discussed with ED Physician Dr Merry Proud accepts transfer   Patient is in MRI and MRI reading and transport pending

## 2021-12-25 NOTE — EDIE (Signed)
COLLECTIVE?NOTIFICATION?12/25/2021 04:33?[name], [name]?MRN: [id number]    Criteria Met      5 ED Visits in 12 Months    3 Different Facilities in 90 Days    Security and Safety  No Security Events were found.  ED Care Guidelines  There are currently no ED Care Guidelines for this patient. Please check your facility's medical records system.    Flags      Negative COVID-19 Lab Result - [facility] - A specimen collected from this patient was negative for COVID-19 / Attributed By: [place] Department of Health / Attributed On: 11/27/2021       Prescription Monitoring Program  441??- Narcotic Use Score  381??- Sedative Use Score  000??- Stimulant Use Score  300??- Overdose Risk Score  - All Scores range from 000-999 with 75% of the population scoring < 200 and on 1% scoring above 650  - The last digit of the narcotic, sedative, and stimulant score indicates the number of active prescriptions of that type  - Higher Use scores correlate with increased prescribers, pharmacies, mg equiv, and overlapping prescriptions  - Higher Overdose Risk Scores correlate with increased risk of unintentional overdose death   Concerning or unexpectedly high scores should prompt a review of the PMP record; this does not constitute checking PMP for prescribing purposes.    E.D. Visit Count (12 mo.)  Facility Visits   [facility] 11   [place] [facility] 1   [place] 15   [facility] 1   Total 28   Note: Visits indicate total known visits.     Recent Emergency Department Visit Summary  Showing 10 most recent visits out of 28 in the past 12 months   Date Facility [facility] Type Diagnoses or Chief Complaint    Dec 25, 2021  [name]  [place]  [place]  Emergency      lower extremity weakness      Dec 24, 2021  [place] [facility]  [name]  [place]  Emergency      Numbess; Drooling      Chest Pain      Numbness      Headache      Other symptoms and signs involving the musculoskeletal system       Dec 23, 2021  [facility]  [name]  [place]  Emergency      RS/ WEAKNESS      Weakness      Urinary tract infection, site not specified      Nausea      Dec 11, 2021  [facility] [place]  [name]  [place]  Emergency      rs/ reaction to medication      Paresthesia of skin      Hypokalemia      Nov 30, 2021  [facility] [place]  [name]  [place]  Emergency      RS/ CHEST PAIN      Constipation, unspecified      Rhabdomyolysis      Nausea with vomiting, unspecified      Nov 28, 2021  [facility] [place] [facility]  [name]  Emergency      gastric and bowels, PCP told to come in      Generalized abdominal pain      Constipation, unspecified      1. Nausea with vomiting, unspecified  2. Unspecified abdominal pain      2. Abnormal weight loss      3. Type 2 diabetes mellitus without complications      3. Shortness of breath      4. Chronic obstructive pulmonary disease, unspecified      4. Hypertensive heart disease with heart failure      Nov 26, 2021  [facility] [place]  [name]  [name]  Emergency      ABNORMAL LABS-LEG PAIN-FROM DR. WILLIAMS      Rhabdomyolysis      1. Pain in left lower leg      2. Pain in right lower leg      2. Other chest pain      3. Hemiplegia and hemiparesis following cerebral infarction affecting left non-dominant side      4. Essential (primary) hypertension      5. Paroxysmal atrial fibrillation      6. Type 2 diabetes mellitus without complications      7. Chronic obstructive pulmonary disease, unspecified      Nov 18, 2021  [facility] [place]  [name]  [name]  Emergency      CHEST PAIN      Chest pain, unspecified      Nov 13, 2021  [facility] [place] .  [name]  Emergency      HAVENT HAD A BOWEL MOVEMENT IN A MONTH. FEELING WEAK      Constipation, unspecified      1. Unspecified abdominal pain      2. Nausea      2. Essential (primary) hypertension      3. Chronic obstructive pulmonary disease, unspecified      4. Type 2 diabetes mellitus  without complications      5. Paroxysmal atrial fibrillation      6. Hyperlipidemia, unspecified      7. Bipolar disorder, unspecified      Oct 22, 2021  [facility] [place] .  [name]  Emergency      vomiting, dehydration, body aches      1. Epigastric pain      2. Nausea with vomiting, unspecified      3. Essential (primary) hypertension      4. Type 2 diabetes mellitus without complications      5. Nutritional anemia, unspecified      6. Paroxysmal atrial fibrillation      7. Chronic obstructive pulmonary disease, unspecified      8. Bipolar disorder, unspecified      9. Hyperlipidemia, unspecified        Recent Inpatient Visit Summary  Date Facility [facility] Type Diagnoses or Chief Complaint    Nov 30, 2021  [name]  [name]    Family Practice      Constipation, unspecified      1. Nausea with vomiting, unspecified      1. Hypokalemia      2. Cerebral infarction, unspecified      2. Unspecified abdominal pain      3. Shortness of breath      3. Hemiplegia and hemiparesis following cerebral infarction affecting left non-dominant side      4. Rhabdomyolysis      5. Other disorders of phosphorus metabolism      6. Epilepsy, unspecified, not intractable, without status epilepticus      Aug 24, 2021  [place] [facility] [place]  [name]  [name]  Family Practice      Acute renal failure (ARF) (HCC)      1. Acute kidney failure, unspecified      2. Other chest pain      2. Hemiplegia and hemiparesis following cerebral infarction affecting left non-dominant side      3. Pain in left shoulder      3. Hypotension, unspecified      4. Nausea      4. Thoracic aortic aneurysm, without rupture, unspecified      5. Type 2 diabetes mellitus without complications      6. Epilepsy, unspecified, not intractable, without status epilepticus      Jun 29, 2021  [facility] [place]  [name]  [name]  General Medicine      Chest pain, unspecified type      Apr 21, 2021  [facility] [place]   [name]  [place]  Family Practice      HEADACHE      LEFT SIDED WEAKNESS      Left sided numbness      Facial paresthesia      Left-sided weakness      Mar 11, 2021  [facility] [place]  [name]  [place]  General Medicine      FACIAL DROOP      1. Facial weakness      2. Hemiplegia and hemiparesis following cerebral infarction affecting left non-dominant side      3. Paroxysmal atrial fibrillation      4. Long term (current) use of anticoagulants      5. Pure hypercholesterolemia, unspecified      6. Obesity, unspecified      7. Body mass index [BMI] 37.0-37.9, adult      8. Chronic obstructive pulmonary disease, unspecified      9. Gastro-esophageal reflux disease without esophagitis        Care Team  Provider Specialty Phone Fax Service Dates   [name] , M.D. Family Medicine [phone]  Current      Collective Portal  This patient has registered at the [facility] Emergency Department   For more information visit: [email or web address]     PLEASE NOTE:     1.   Any care recommendations and other clinical information are provided as guidelines or for historical purposes only, and providers should exercise their own clinical judgment when providing care.    2.   You may only use this information for purposes of treatment, payment or health care operations activities, and subject to the limitations of applicable Collective Policies.    3.   You should consult directly with the organization that provided a care guideline or other clinical history with any questions about additional information or accuracy or completeness of information provided.    ? 2023 [place] - [email or web address]

## 2021-12-25 NOTE — ED Provider Notes (Signed)
EMERGENCY DEPT VISIT NOTE    Patient information was obtained from patient, staff    History/Exam limitations: none.      History of Present Illness    Chief Complaint  No chief complaint on file.      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 female with PMHx COPD not oxygen requiring, atrial fibrillation on chronic anticoagulation, hypertension, hyperlipidemia, sent by transfer from [facility] for bilateral weakness and myalgias.    Had extensive work-up at the outside facility including lumbar puncture which showed no evidence of infection but had slightly elevated protein.    I reviewed her MRI results which include MRI of the brain, cervical, thoracic, lumbar spines which show no acute findings    Patient reports about 3 days of gradually worsening bilateral lower more than upper extremity weakness and soreness.   No associated recent falls or trauma.  Denies paresthesias, confusion, vision changes    In the last month denies any cold or GI symptoms      Past Medical History:   Diagnosis Date    Anemia     Asthma     Atrial fibrillation     Chronic obstructive pulmonary disease     Convulsions     Depression     Diabetes mellitus     diet controlled     Hyperlipidemia     Hypertension     TIA (transient ischemic attack)      Family History   Problem Relation Age of Onset    Stroke Maternal Grandmother      No current facility-administered medications for this encounter.     Current Outpatient Medications   Medication Sig Dispense Refill    acetaminophen (TYLENOL) 325 MG tablet Take 2 tablets (650 mg total) by mouth every 4 (four) hours as needed for Pain 50 tablet 0    albuterol (PROVENTIL HFA) 108 (90 Base) MCG/ACT inhaler Inhale 1 puff into the lungs daily Also take it PRN every 20 minutes.      amLODIPine (NORVASC) 2.5 MG tablet Take 1 tablet (2.5 mg total) by mouth daily 30 tablet 0    apixaban (ELIQUIS) 5 MG Take 1 tablet (5 mg total) by mouth every 12 (twelve) hours 30 tablet 0    atorvastatin (LIPITOR) 40 MG  tablet Take 40 mg by mouth daily      budesonide-formoterol (SYMBICORT) 80-4.5 MCG/ACT inhaler Inhale 2 puffs into the lungs daily      butalbital-acetaminophen-caffeine (FIORICET) 50-325-40 MG per tablet Take 1 tablet by mouth every 6 (six) hours as needed for Headaches 5 tablet 0    furosemide (LASIX) 20 MG tablet Take 1 tablet (20 mg total) by mouth daily (Patient taking differently: Take 20 mg by mouth 2 (two) times daily Patient states she takes lasix every other day  ) 30 tablet 0    lamoTRIgine (LAMICTAL) 150 MG tablet Take 1 tablet (150 mg total) by mouth 2 (two) times daily (Patient taking differently: Take 150 mg by mouth daily   ) 60 tablet 0    LORazepam (Ativan) 0.5 MG tablet Take 0.5 mg by mouth daily as needed for Anxiety      Lurasidone HCl (Latuda) 40 MG Tab Take 40 mg by mouth nightly      metoprolol succinate XL (TOPROL-XL) 25 MG 24 hr tablet Take 25 mg by mouth daily      pantoprazole (PROTONIX) 40 MG tablet Take 1 tablet (40 mg total) by mouth daily 30 tablet 0  QUEtiapine (SEROQUEL) 50 MG tablet Take 1 tablet (50 mg total) by mouth nightly 30 tablet 0    rOPINIRole (REQUIP) 1 MG tablet Take 1 tablet (1 mg total) by mouth nightly 30 tablet 0    terazosin (HYTRIN) 2 MG capsule Take 1 capsule (2 mg total) by mouth nightly 30 capsule 1     Allergies   Allergen Reactions    Singulair [Montelukast] Itching    Myoview [Technetium-74m] Itching    Contrast [Iodinated Contrast Media]     Latex     Nitroglycerin     Penicillins     Tetracyclines & Related      Social History     Socioeconomic History    Marital status: Divorced   Tobacco Use    Smoking status: Never    Smokeless tobacco: Never   Vaping Use    Vaping Use: Never used   Substance and Sexual Activity    Alcohol use: Never    Drug use: Never   Other Topics Concern    Acutrim No    [place]etta No    Contrave No    Dexatrim No    Diethylpropion No    Fastin No    Fen - Phen No    Ionamin / Adipex No    Phentermine No    Qsymia No    Prozac No     Saxenda No    Topamax No    Wellbutrin No    Xenical (Orlistat, Alli) No    Other Med No     Social Determinants of Psychologist, prison and probation services Strain: Low Risk     Difficulty of Paying Living Expenses: Not hard at all   Food Insecurity: No Food Insecurity    Worried About Programme researcher, broadcasting/film/video in the Last Year: Never true    Barista in the Last Year: Never true   Transportation Needs: Unmet Transportation Needs    Lack of Transportation (Medical): Yes    Lack of Transportation (Non-Medical): Yes   Physical Activity: Insufficiently Active    Days of Exercise per Week: 2 days    Minutes of Exercise per Session: 20 min   Stress: Stress Concern Present    Feeling of Stress : To some extent   Social Connections: Moderately Integrated    Frequency of Communication with Friends and Family: More than three times a week    Frequency of Social Gatherings with Friends and Family: Twice a week    Attends Religious Services: More than 4 times per year    Active Member of [employer] or Organizations: Yes    Attends Engineer, structural: More than 4 times per year    Marital Status: Divorced   Catering manager Violence: Not At Risk    Fear of Current or Ex-Partner: No    Emotionally Abused: No    Physically Abused: No    Sexually Abused: No       Review of Systems    Positive and negative systems as per HPI.  All other systems reviewed and are negative.      Physical Exam  BP 161/76   Pulse (!) 102   Temp 98.7 F (37.1 C) (Oral)   Resp 15   Ht 5\' 5"  (1.651 m)   Wt 72.6 kg   LMP  (LMP Unknown)   SpO2 98%   BMI 26.63 kg/m     Nursing note and vitals reviewed  Constitutional: Appears  well-developed and well-nourished. No acute distress. Sitting up comfortably, interactive, speaks clearly & completely  Eyes: Conjunctiva and EOM are normal. PERRL, 3mm bilat  ENT: Nose clear. No redness. MM moist, no tonsillar exudate or asymmetry  Cardiovascular: Normal rate, regular rhythm. 2+ rad and DP pulses bilat  Chest  wall: Nontender on palpation  Pulmonary: Effort normal. No retractions, no stridor  Abdominal: Soft, No distention. There is no focal tenderness. There is no  rebound and no guarding.  Musculoskeletal: Normal range of motion bilateral wrists and hips, normal tone no clonus  Neurological: Alert and oriented to person, place, and time.   Normal speech no dysarthria aphasia  2/5 strength with bilateral hip flexion and leg raise  4/5 strength bilateral foot dorsiflexion and plantarflexion  Trace weakness bilateral arm raise   5/5 strength bilateral handgrip    ----    Medications I ordered for patient during their visit:    Medications   fentaNYL (PF) (SUBLIMAZE) injection 50 mcg (50 mcg Intravenous Given 12/25/21 0557)       O2 Sat:  The patient's oxygen saturation was 98 % on room air. This was independently interpreted by me as Normal.       I personally reviewed and interpreted the patient's most recent medical records    I personally reviewed and interpreted the patient's lab results    Labs Reviewed   CK - Abnormal; Notable for the following components:       Result Value    Creatine Kinase (CK) 239 (*)     All other components within normal limits       I personally reviewed and interpreted the patient's imaging results    No orders to display         Consults:  CNS -I personally spoke with the hospitalist on-call, agrees with admission for further neurologic evaluation      ED Course & MDM:     Unfortunate female with new onset bilateral lower more than upper extremity weakness.  My chief concern is for auto inflammatory disease such as polymyalgia rheumatica, discussed consideration of neuromuscular disorders or neurodegenerative disorders as well.  I reviewed her MRI results which are reassuring  Admit for ongoing medical management    --      Diagnosis:   1. Weakness of both legs      Disposition:   ED Disposition       ED Disposition   Admit    Condition   --    Date/Time   Sat Dec 25, 2021  5:30 AM    Comment    Admitting Physician: Emeline Darling [[id number]]   Service:: Neurology [115]   Stroke or Rule Out Stroke?: No   Estimated Length of Stay: > or = to 2 midnights   Tentative Discharge Plan?: Home or Self Care [1]   Does patient need telemetry?: Yes   Is patient 18 yrs or greater?: Yes   Telemetry type (separate Telemetry order is also required):: Adult telemetry               Condition: Stable      Trellis Moment, MD  6:14 AM      This care is provided during an unprecedented national emergency due to the Novel Coronavirus (COVID-19). COVID-19 infections and transmission risks place heavy strains on healthcare resources. Outcomes are unpredictable and treatments are without well-defined guidelines. Further, the impact of COVID-19 on all aspects of emergency care, including the impact to patients  seeking care for reasons other than COVID-19, is unavoidable during this national emergency      *This note was generated by the Epic EMR system/ Dragon speech recognition and may contain inherent errors or omissions not intended by the user. Grammatical errors, random word insertions, deletions, pronoun errors and incomplete sentences are occasional consequences of this technology due to software limitations. Not all errors are caught or corrected. If there are questions or concerns about the content of this note or information contained within the body of this dictation they should be addressed directly with the author for clarification.Randolm Idol, Hampton Abbot, MD  12/25/21 [id number]

## 2021-12-26 DIAGNOSIS — K3184 Gastroparesis: Secondary | ICD-10-CM

## 2021-12-26 DIAGNOSIS — M546 Pain in thoracic spine: Secondary | ICD-10-CM

## 2021-12-26 DIAGNOSIS — R270 Ataxia, unspecified: Secondary | ICD-10-CM

## 2021-12-26 DIAGNOSIS — E876 Hypokalemia: Secondary | ICD-10-CM

## 2021-12-26 LAB — BASIC METABOLIC PANEL
Anion Gap: 11 (ref 5.0–15.0)
BUN: 6 mg/dL — ABNORMAL LOW (ref 7.0–21.0)
CO2: 24 mEq/L (ref 17–29)
Calcium: 8.8 mg/dL (ref 8.5–10.5)
Chloride: 98 mEq/L — ABNORMAL LOW (ref 99–111)
Creatinine: 0.6 mg/dL (ref 0.4–1.0)
Glucose: 72 mg/dL (ref 70–100)
Potassium: 2.8 mEq/L — CL (ref 3.5–5.3)
Sodium: 133 mEq/L — ABNORMAL LOW (ref 135–145)

## 2021-12-26 LAB — CBC AND DIFFERENTIAL
Absolute NRBC: 0 10*3/uL (ref 0.00–0.00)
Basophils Absolute Automated: 0.03 10*3/uL (ref 0.00–0.08)
Basophils Automated: 0.6 %
Eosinophils Absolute Automated: 0.08 10*3/uL (ref 0.00–0.44)
Eosinophils Automated: 1.7 %
Hematocrit: 24.6 % — ABNORMAL LOW (ref 34.7–43.7)
Hgb: 7.7 g/dL — ABNORMAL LOW (ref 11.4–14.8)
Immature Granulocytes Absolute: 0.04 10*3/uL (ref 0.00–0.07)
Immature Granulocytes: 0.9 %
Instrument Absolute Neutrophil Count: 2.33 10*3/uL (ref 1.10–6.33)
Lymphocytes Absolute Automated: 1.8 10*3/uL (ref 0.42–3.22)
Lymphocytes Automated: 38.6 %
MCH: 25 pg — ABNORMAL LOW (ref 25.1–33.5)
MCHC: 31.3 g/dL — ABNORMAL LOW (ref 31.5–35.8)
MCV: 79.9 fL (ref 78.0–96.0)
MPV: 8.3 fL — ABNORMAL LOW (ref 8.9–12.5)
Monocytes Absolute Automated: 0.38 10*3/uL (ref 0.21–0.85)
Monocytes: 8.2 %
Neutrophils Absolute: 2.33 10*3/uL (ref 1.10–6.33)
Neutrophils: 50 %
Nucleated RBC: 0 /100 WBC (ref 0.0–0.0)
Platelets: 331 10*3/uL (ref 142–346)
RBC: 3.08 10*6/uL — ABNORMAL LOW (ref 3.90–5.10)
RDW: 16 % — ABNORMAL HIGH (ref 11–15)
WBC: 4.66 10*3/uL (ref 3.10–9.50)

## 2021-12-26 LAB — MAGNESIUM: Magnesium: 1.6 mg/dL (ref 1.6–2.6)

## 2021-12-26 LAB — PT AND APTT
PT INR: 1.1 (ref 0.9–1.1)
PT: 13.1 s — ABNORMAL HIGH (ref 10.1–12.9)
PTT: 27 s (ref 27–39)

## 2021-12-26 LAB — GFR: EGFR: >60

## 2021-12-26 MED ORDER — CYCLOBENZAPRINE HCL 10 MG PO TABS
2.5000 mg | ORAL_TABLET | Freq: Three times a day (TID) | ORAL | Status: DC | PRN
Start: 2021-12-26 — End: 2021-12-26
  Administered 2021-12-26 (×2): 2.5 mg via ORAL
  Filled 2021-12-26 (×2): qty 1

## 2021-12-26 MED ORDER — BISACODYL 5 MG PO TBEC
5.0000 mg | DELAYED_RELEASE_TABLET | Freq: Every day | ORAL | Status: DC
Start: 2021-12-26 — End: 2022-01-03
  Administered 2021-12-27 – 2022-01-03 (×2): 5 mg via ORAL
  Filled 2021-12-26 (×7): qty 1

## 2021-12-26 MED ORDER — NORTRIPTYLINE HCL 10 MG PO CAPS
10.0000 mg | ORAL_CAPSULE | Freq: Every evening | ORAL | Status: DC
Start: 2021-12-26 — End: 2022-01-03
  Administered 2021-12-26 – 2022-01-02 (×8): 10 mg via ORAL
  Filled 2021-12-26 (×9): qty 1

## 2021-12-26 MED ORDER — VITAMINS/MINERALS PO TABS
1.0000 | ORAL_TABLET | Freq: Every day | ORAL | Status: DC
Start: 2021-12-26 — End: 2022-01-03
  Administered 2021-12-26 – 2022-01-03 (×9): 1 via ORAL
  Filled 2021-12-26 (×9): qty 1

## 2021-12-26 MED ORDER — ALUM & MAG HYDROXIDE-SIMETH 200-200-20 MG/5ML PO SUSP
30.0000 mL | ORAL | Status: DC | PRN
Start: 2021-12-26 — End: 2022-01-03

## 2021-12-26 MED ORDER — LACTULOSE 10 GM/15ML PO SOLN
20.0000 g | Freq: Four times a day (QID) | ORAL | Status: DC | PRN
Start: 2021-12-26 — End: 2021-12-27
  Administered 2021-12-26: 20 g via ORAL
  Filled 2021-12-26: qty 30

## 2021-12-26 MED ORDER — MAGNESIUM SULFATE IN D5W 1-5 GM/100ML-% IV SOLN
1.0000 g | INTRAVENOUS | Status: AC
Start: 2021-12-26 — End: 2021-12-26
  Administered 2021-12-26 (×2): 1 g via INTRAVENOUS
  Filled 2021-12-26 (×2): qty 100

## 2021-12-26 MED ORDER — LIDOCAINE 5 % EX PTCH
1.0000 | MEDICATED_PATCH | CUTANEOUS | Status: DC
Start: 2021-12-26 — End: 2022-01-03
  Administered 2021-12-26 – 2022-01-03 (×8): 1 via TRANSDERMAL
  Filled 2021-12-26 (×8): qty 1

## 2021-12-26 NOTE — Progress Notes (Signed)
NURSING PROGRESS NOTE    Patient Name: [name] (66 y.o. female)  Admission Date: 12/25/2021 [facility] Day 1)    Shift Note: PRN Gabapentin given x1  PRN tylenol given x1  PRN pericolace given x1  PRN Dilaudid given x1  K+ critical 2.8. Replacement given per protocol. Overnight hospitalist notified.  Pt moved to sizewise bed.  Fall and safety precautions in place.    A&Ox4, FC, Pt frequently drowsy  Decreased sensation to LUE, BLE  RUE 4/5, LUE 3-4/5 w/ drift, RLE flickers to command, LLE flickers to command  ST/NSR  Clear/dim, RA  Incontinent x2  Reg diet, thin liquids    Recent Labs   Lab 12/26/21  0536   Sodium 133*   Potassium 2.8*   Chloride 98*   CO2 24   BUN 6.0*   Creatinine 0.6   EGFR >60.0   Glucose 72   Calcium 8.8       Recent Labs   Lab 12/26/21  0536   WBC 4.66   Hgb 7.7*   Hematocrit 24.6*   Platelets 331         Patient Lines/Drains/Airways Status       Active Lines, Drains and Airways       Name Placement date Placement time Site Days    Peripheral IV 12/25/21 20 G Standard Right Antecubital 12/25/21  0600  Antecubital  1                    MEWS Score: 2    Last BM: 3/2 PTA per pt  Pending Orders: Wound consult  Discharge Plan: TBD    Safety Checklist   Fall Precautions Y   Avasys N   Seizure Precautions N   Aspiration Precautions N   Belongings Checked Y     Service Essentials  Medication Teaching Y   Purposeful Hourly Rounding Y       Interpreter Services:  Does the patient require an Interpreter? N    If yes, what form of interpreter services was used? NA     If family was utilized, is interpreter waiver form signed and in the chart? NA

## 2021-12-26 NOTE — UM Notes (Signed)
PATIENT NAME: [name],[name]   DOB: 1956-01-18   PMH:  has a past medical history of Anemia, Asthma, Atrial fibrillation, Chronic obstructive pulmonary disease, Convulsions, Depression, Diabetes mellitus, Hyperlipidemia, Hypertension, and TIA (transient ischemic attack).  PSH:  has a past surgical history that includes Hysterectomy; pci; and Cardiac catheterization (2017).     Admission Order: Adult Admit to Inpatient ([facility] Only) (Order [id number])  Admission  Date: 12/25/2021 Department: [facility] 7 Ordering/Authorizing: Lafe Garin, MD     Reprint Order Requisition    Adult Admit to Inpatient ([facility] Only) (Order #[id number]) on 12/25/21     General Information    No case/log ID found     Order Information    Order Date/Time Release Date/Time Start Date/Time End Date/Time   12/25/21 05:30 AM None 12/25/21 05:30 AM 12/25/21 05:30 AM       ADMISSION REVIEW   History of present illness: Pt is a 66 y.o. female arrived at [facility] on (12/25/2021 at 0433). Presents to [facility] with difficulty standing and walking, bilateral leg/hand weakness, bilateral leg/hand/face paresthesias. She noted bilateral leg weakness with her prior [facility] admission 2/7-2/17 and was readmitted for bilateral leg weakness 2/18-2/20. The past 4 days she has had difficulty standing and walking. The past 4 days she notes new bilateral hand and face paresthesias and bilateral hand weakness. She has some shortness of breath but is speaking in full sentences. She went to [facility] 3/2 and was discharged. She saw her PMD who advised she go to an [facility]. At [facility] she got MRI brain and CTL-spine which did not show a cause for her symptoms. [facility] called neurology Dr. Francesco Sor who said to consider IVIG for 5 days, LP, EMG. The LP was done at [facility]. Dr. Francesco Sor advised transfer to [facility].      Arrival VS: Vitals BP: 175/84, Temp: 98.7 F (37.1 C), Temp Source: Oral, Heart Rate: (!) 107, Resp Rate: 16, SpO2: 98 %, Height: 165.1 cm (5\' 5" ),  Weight: 72.6 kg (160 lb)      Abnormal Labs: ck 239    Diagnostics:  n/a    Medications in ED: Fentanyl IV    Consults: Neurology: LE weakness, paresthesia  Added labs including: motor and sensory neuropathy eval, syphillis, B6, B12, TSH, ANA, Crp, copper, Zinc, HIV, B1. prior CSF cx in process  -Thiamine repletion with 500 mg IV q8h x 3 days followed by  250 mg IV daily x 3 days followed by 100 mg po daily.   -consider IVIG pending clinical outcome after thiamine infusion.  -PT/OT  -possible EMG.    Admit to IP status, Dx  Weakness of both legs [R29.898], [facility] 7      Plan: Difficulty standing and walking, bilateral leg/hand weakness, bilateral leg/hand/face paresthesias - Transferred over at Dr. Chrisandra Carota request perhaps for EMG. Guillain-Barre is a consideration. She got a dose of IVIG at [facility]. Defer additional IVIG and EMG to neurology. With rhabdomyolysis polymyositis is a consideration but her CK is improved now so less likely. Neuro checks. PT/OT. NIF/VC  IMG Neurology consult  DVT/GI prophylaxis - SCDs. Hold Eliquis for 24 hours given LP.   Status/Rationale: Inpatient neurotelemetry      DAY 2 Progress Note   CSR Date: 12/26/2021 [facility] 7  No new events overnight. Pt denies any improvement of weakness or paresthesia. Endorses improvement of EOM. C/o persistent pain around torso and back. Constant, 9/10    O2:  room air  Diet: regular  VS:  I&O   Temp:  [98 F (36.7 C)-98.6 F (37 C)]   Heart Rate:  [98-112]   Resp Rate:  [16-19]   BP: (145-167)/(79-93)   SpO2:  [97 %-100 %]    No intake/output data recorded.       Labs: na 133, k 2.8, cl 98, BUN 6.0, h+h 7.7/24.6    Medications:   Current Facility-Administered Medications   Medication Dose Route Frequency    amLODIPine  2.5 mg Oral Daily    apixaban  5 mg Oral Q12H SCH    bisacodyl  5 mg Oral Daily    budesonide-formoterol  2 puff Inhalation BID    lidocaine  1 patch Transdermal Q24H    lurasidone  120  mg Oral Daily    metoprolol succinate XL  25 mg Oral Daily    nortriptyline  10 mg Oral QHS    pantoprazole  40 mg Oral Daily    rOPINIRole  1 mg Oral QHS    terazosin  2 mg Oral QHS    thiamine (VITAMIN B-1) IVPB  500 mg Intravenous Q8H    Followed by    [START ON 12/28/2021] thiamine (VITAMIN B-1) IVPB  250 mg Intravenous Q24H SCH    Followed by    Melene Muller ON 12/31/2021] thiamine  100 mg Oral Daily          Plan: -cont Thiamine 500 mg IV q8h x 3 days followed by 250 mg IV daily x 3 days followed by 100 mg po daily.   -EMG, tentatively Monday  -consider IVIG pending EMG results and clinical outcome after thiamine infusion.  -fu labs: motor and sensory neuropathy eval, syphillis, B6, B12, TSH, ANA, Crp, copper, Zinc, HIV, B1. prior CSF cx in process  -PT/OT       Primary Payor: MEDICARE MCO / Plan: ANTHEM HLTHKPRS MEDIBLUE DUAL [employer] SNP / Product Type: MANAGED MEDICARE /     UTILIZATION REVIEW CONTACT: Name: Bobette Mo. Karel Jarvis RN BSN  Clinical Case Manager  - Utilization Review  [facility]  Address:  [address] [place], [place]  [address]  NPI:   [id number]  Tax ID:  [id number]  Phone: [phone]  Fax: [phone]    Please use fax number [phone] to provide authorization for hospital services or to request additional information.

## 2021-12-26 NOTE — PT Eval Note (Signed)
Physical Therapy Evaluation  [name]      Post Acute Care Therapy Recommendations:   Discharge Recommendations:  Acute Rehab    If Acute Rehab  recommended discharge disposition is not available, patient will need mod assist for OOB mobility and self care tasks, skilled transport into home, 1 level set-up, and HHPT services.     DME needs IF patient is discharging home: Hospital bed, BSC, Wheelchair-manual (bed with air mattress, WC with pressure relief cushion)    Therapy discharge recommendations may change with patient status.  Please refer to most recent note for up-to-date recommendations.    Assessment:   Significant Findings: none    [name] is a 66 y.o. female admitted 12/25/2021 with progressive LE weakness, paraesthesias, and functional decline.  Medical work-up remains unclear for etiology at this time.   Upon examination, pt demonstrates active movement all extremities, but with generalized weakness throughout, LE worse than UE, R worse than L. Overall good functional movement initiation for bed mobility, supine to sit, and sitting balance at EOB. Able to transition to upright standing with heavy UE assist and required external PT support for knee extension to prevent buckling.   Pt with profound functional debility at this time. Recommend cont PT services for strength and mobility progression as able, maximize independence to ultimately facilitate return home.     Therapy Diagnosis: global weakness, Decreased functional mobility, Decreased functional independence    Rehabilitation Potential: Good for selected goals. Overall outcome will depend on cont medical work up and intervention re: etiology of weakness     Treatment Activities: eval completed, bed mobility, transfer training  Educated the patient to role of physical therapy, plan of care, goals of therapy and HEP, safety with mobility and ADLs, energy conservation techniques, discharge disposition, home safety.    Plan:   PT Frequency:  4-5x/wk    Treatment/Interventions: Therex, transfers, gait    Risks/benefits/POC discussed with pt    Unit: [facility] TOWER 7  Bed: F740/F740.01     Precautions and Contraindications:   Falls    Consult received for [name] for PT Evaluation and Treatment.  Patient's medical condition is appropriate for Physical Therapy intervention at this time.    History of Present Illness:   [name] is a 66 y.o. female admitted on 12/25/2021. Per H+P: "[name] is a 66 y.o. female hx afib on Eliquis, HTN, HLD, DM, COPD, TIAs, COPD, bipolar disorder, [facility] admit 2/7-2/17/23 for rhabdomyolysis (peak CK 4464 - statin d/c'ed) + vomiting ?gastroparesis + starvation ketoacidosis + UTI, [facility] admit 2/18-2/20/23 for rhabdomyolysis (peak CK 2509) + transaminitis + bilateral leg weakness + right paresthesias, [facility] ER visit 3/2 for bilateral leg weakness who presents to [facility] with difficulty standing and walking, bilateral leg/hand weakness, bilateral leg/hand/face paresthesias. She noted bilateral leg weakness with her prior [facility] admission 2/7-2/17 and was readmitted for bilateral leg weakness 2/18-2/20. The past 4 days she has had difficulty standing and walking. The past 4 days she notes new bilateral hand and face paresthesias and bilateral hand weakness. She has some shortness of breath but is speaking in full sentences. She went to [facility] ER 3/2 and was discharged. She saw her PMD who advised she go to an [facility]. At [facility] she got MRI brain and CTL-spine which did not show a cause for her symptoms. [facility] called neurology Dr. Francesco Sor who said to consider IVIG for 5 days, LP, EMG. The LP was done at [facility]. Dr. Francesco Sor  advised transfer to [facility]. These symptoms are sudden onset, moderate intensity, without alleviating factors."      Medical Diagnosis: Weakness of both legs [R29.898]    Past Medical/Surgical History:  Past Medical History:   Diagnosis Date    Anemia     Asthma     Atrial fibrillation      Chronic obstructive pulmonary disease     Convulsions     Depression     Diabetes mellitus     diet controlled     Hyperlipidemia     Hypertension     TIA (transient ischemic attack)      Past Surgical History:   Procedure Laterality Date    CARDIAC CATHETERIZATION  2017    HYSTERECTOMY      pci         Imaging/Tests/Labs:  CT Head WO Contrast    Result Date: 12/24/2021   No acute intracranial process. Sandie Ano, MD  12/24/2021 8:17 PM    MRI Brain WO Contrast    Result Date: 12/25/2021  No acute intracranial abnormality. Mild chronic microvascular ischemic changes. Cavum septum pellucidum and cavum vergae. Alric Seton MD, MD  12/25/2021 2:47 AM    MRI Cervical Spine WO Contrast    Result Date: 12/25/2021  No cord compression or intrinsic cord signal abnormality. Multilevel degenerative changes as described above, with varying degrees of neural foraminal narrowing, and no central canal stenosis. Alric Seton MD, MD  12/25/2021 2:53 AM    MRI Thoracic Spine WO Contrast    Result Date: 12/25/2021  No cord compression or intrinsic cord signal abnormality. Minimal degenerative changes as described above. Alric Seton MD, MD  12/25/2021 2:56 AM    MRI Lumbar Spine WO Contrast    Result Date: 12/25/2021  1. 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 Orson Gear, MD  12/25/2021 2:54 AM    Chest AP Portable    Result Date: 12/24/2021   No acute pulmonary or pleural disease. Sandie Ano, MD  12/24/2021 7:14 PM     Recent Labs   Lab 12/26/21  0536   WBC 4.66   Hgb 7.7*   Hematocrit 24.6*   Platelets 331     Recent Labs   Lab 12/26/21  0536   Sodium 133*   Potassium 2.8*   Chloride 98*   CO2 24   BUN 6.0*   Creatinine 0.6   EGFR >60.0   Glucose 72   Calcium 8.8       Social History:   Prior Level of Function: Prior to Admission for rhabdo in February, pt reports she was active and independent without devices. Reports progressive functional decline over the past month 2/2 progressive  LE weakness  Assistive Devices: FWW  Baseline Activity: does not drive. On disability. Primarily household, short community distances  DME Currently at Home: FWW, [facility], shower chair  Home Living Arrangements: with daughter (home during day) and son-in-law  Type of Home: 3 level house  Home Layout: bedroom on 2nd floor    Subjective: "I don't know what's wrong with my body"   Patient is agreeable to participation in the therapy session. Nursing clears patient for therapy.     Patient Goal: get stronger    Pain:   Denies c/o    Objective:   Patient is in bed (specialty bed with air mattress) with dressings, telemetry, and peripheral IV in place.  Pt wore mask during  therapy session:No      Cognitive Status and Neuro Exam:  A+Ox4. Pleasant and cooperative. Follows all commands.   Overall, low tone. Reports decreased sensation in hands.       Musculoskeletal Examination  RUE ROM: WFL  LUE ROM: WFL  RLE ROM: WFL  LLE ROM: WFL    RUE strength: grossly 3/5  LUE strength: grossly 3-/5  RLE strength: DF3/5,PF 3+/5, Quad 3-/5, hip flex 2+/5, hip abd 2+/5  LLE strength: DF3/5,PF 3+/5, Quad 3-/5, hip flex 3-/5, hip abd 3-/5      Functional Mobility  Rolling: mod I with rail to L and R. X3 ea B. Pt noted to be incontinent of urine upon rolling.   Supine to Sit: mod A for LEs, min-mod A for trunk  Scooting: min-mod A with UE use at EOB  Sit to Stand: mod-max A plus LE blocking into extension. Static stand x30 seconds with fwd trunk flexion, decreased hip extension initiation  Stand to Sit: max A to control lowering  Sit to Supine: max A for LEs      PMP - Progressive Mobility Protocol   PMP Activity: Step 4 - Dangle at Bedside x8 mins      Balance  Static Sitting: maintains unsupported sit at EOB  Dynamic Sitting: able to reach with R/L UE, LAQ with R/L LE without LOB  Static Standing: Poor  Dynamic Standing: Poor    Participation and Activity Tolerance  Participation Effort: Good  Endurance: Good for selected tasks.     AM-PACT  Inpatient Short Forms  Inpatient AM-PACT Performed? (PT): Basic Mobility Inpatient Short Form  AM-PACT "6 Clicks" Basic Mobility Inpatient Short Form  Turning Over in Bed: A little  Sitting Down On/Standing From Armchair: A lot  Lying on Back to Sitting on Side of Bed: A little  Assist Moving to/from Bed to Chair: A lot  Assist to Walk in Hospital Room: Total  Assist to Climb 3-5 Steps with Railing: Total  PT Basic Mobility Raw Score: 12  CMS 0-100% Score: 68.66%      Patient left supine in bed with call bell within reach, all needs met, SCDs not in room, fall mat in place, bed alarm on and all questions answered. RN notified of session outcome and patient response.     Goals:  Goals  Goal Formulation: With patient  Time for Goal Acheivement: 10 visits  Goals: Select goal  Pt Will Roll Left: independent  Pt Will Roll Right: independent  Pt Will Go Supine To Sit: with stand by assist  Pt Will Perform Sit To Supine: with minimal assist (for LEs)  Pt Will Sit Edge of Bed: 11-15 min, modified independent  Pt Will Perform Sit to Stand: with minimal assist  Pt Will Transfer Bed/Chair: with rolling walker, with moderate assist  Pt Will Ambulate: 1-10 feet, with rolling walker, with moderate assist  Pt Will Propel Wheelchair: 51-150 feet, with stand by assist  Pt Will Demo / Request Pressure Relief: modified independent      PPE worn during session: procedural mask and gloves    Tech present: n/a  PPE worn by tech: N/A        Time of Treatment  PT Received On: 12/26/21  Start Time: 1721  Stop Time: 1751  Time Calculation (min): 30 min    Alfonso Ellis, [place]  Pager [phone]

## 2021-12-26 NOTE — Progress Notes (Addendum)
Morning NIF and VC completed with good patient's efforts. Patient is stable with clear breath sounds through out all lung fields, Vital signs are stable with 97% saturation on room air. Attending physician made aware of the results. Will continue to monitor.      12/26/21 0925   Respiratory Parameters - Non Ventilated   Status Completed   Resp Rate 18   Vital capacity 1.5 L   $ Vital Capacity Performed? Yes   Negative inspiratory force -12 cm H2o   $ NIF DONE Yes   Equipment labeled Yes   Adverse Reactions None

## 2021-12-26 NOTE — Progress Notes (Signed)
[facility] hospital   [facility] HOSPITALIST PROGRESS NOTE    Today's date & time: 12/26/21 9:45 AM  Patient Name: [facility]  Attending Physician: Viviann Spare, MD  Admission Date: 12/25/2021    Assessment:     Active Hospital Problems    Diagnosis    Hypokalemia    Thoracic back pain    Gastroparesis    Weakness of both legs    Type 2 diabetes mellitus    Paroxysmal atrial fibrillation     Plan:   Difficulty walking and bilateral leg and hand weakness - patient to has multiple vague complaints who was transferred from outside hospital for consideration of EMG for [name] consideration, and possible IVIG, currently seen by neurology team here and based on her history started the patient on thiamine IV, at this time we will continue to perform EMG, possible IVIG, LP was done and came back unremarkable, got seen by PT and OT team who recommended going to acute rehab, fall precaution, continue monitoring.  Patient had NIF and VC done today which showed low volume.   Upper abdominal and mid back pain - patient complaining of having some pain in the epigastric area banding leg to the mid back, she is placed on PPI, Tums, muscle relaxants, as needed pain medications, may consider placing the patient on scheduled Tylenol, will continue monitoring.   Paroxysmal atrial fibrillation -rate is controlled, patient is on apixaban.  Metoprolol.   Hypertension -blood pressure is controlled, continue monitoring.   COPD - noted, patient is getting Symbicort, in no acute respiratory distress, on oxygen.   Type 2 diabetes mellitus - borderline hemoglobin A1c in the past 2020 at 6.0, currently no treatment, will check hemoglobin A1c tomorrow morning.   Hypokalemia - replaced, monitor closely, magnesium level is also lower, will replace magnesium  Anemia - most likely poor nutrition and chronic disease, will check iron profile, consider iron infusion.  Multivitamins added.   Bipolar disorder - patient seem to have some issues with  behavior, patient is on nortriptyline and Latuda, no acute exacerbation, continue monitoring.     GI prophylaxis: Protonix, Maalox    Subjective     CC: Weakness of both legs    Interval History/24 hour events: Complaining of upper abdominal and mid back pain    HPI: HPI per Admitting Provider   " Amberly Livas is a 66 y.o. female hx afib on Eliquis, HTN, HLD, DM, COPD, TIAs, COPD, bipolar disorder, [facility] admit 2/7-2/17/23 for rhabdomyolysis (peak CK 4464 - statin d/c'ed) + vomiting ?gastroparesis + starvation ketoacidosis + UTI, [facility] admit 2/18-2/20/23 for rhabdomyolysis (peak CK 2509) + transaminitis + bilateral leg weakness + right paresthesias, [facility] ER visit 3/2 for bilateral leg weakness who presents to [facility] with difficulty standing and walking, bilateral leg/hand weakness, bilateral leg/hand/face paresthesias. She noted bilateral leg weakness with her prior [facility] admission 2/7-2/17 and was readmitted for bilateral leg weakness 2/18-2/20. The past 4 days she has had difficulty standing and walking. The past 4 days she notes new bilateral hand and face paresthesias and bilateral hand weakness. She has some shortness of breath but is speaking in full sentences. She went to [facility] ER 3/2 and was discharged. She saw her PMD who advised she go to an [facility]. At [facility] she got MRI brain and CTL-spine which did not show a cause for her symptoms. [facility] called neurology Dr. Francesco Sor who said to consider IVIG for 5 days, LP, EMG. The LP was done at [facility].  Dr. Francesco Sor advised transfer to [facility]. These symptoms are sudden onset, moderate intensity, without alleviating factors. "     Hospital course:   Following admission, patient was seen by neurology consult who performed MRI of the entire spine and brain, without contrast which came back to show no major issues except on the lumbar area L4-L5 disc disease with facet arthrosis and degenerative changes, neurology team recommended LP and CSF studies so far are  unrevealing, patient continued to complain of having bandlike pain around the epigastric area & in the back, there was tenderness on the right side of the thoracic vertebral area in the muscle, Lidoderm patch applied, patient is getting Protonix and Maalox, has not had a bowel movement for that reason given lactulose and stool softeners.  Patient was [facility] for consideration of IVIG with a suspicion of GBS, EMG currently pending.     Due to her history of not eating well and has been losing weight over the past few weeks, neurology team felt may be nutritional and started on on IV thiamine, patient has been requesting narcotics for the pain that she is got but not really having any tenderness on examination.  Patient did NIF and vital capacity on the morning of 12/26/2021 which came back -12 cm H2O and vital capacity 1.5 L.  Patient is not in any respiratory distress, not needing any oxygen at this time.  Patient had additional laboratory studies including HIV and syphilis which came back negative, negative for COVID 19, potassium was noted to be low for which replacement was given, CPK slightly elevated at 239.  Cultures remain negative.     Patient was seen by PT and OT team who recommended going to acute rehab.     Review of Systems:   General - back pain   Resp - no SOB   CVS - no CP, no CAD   GI - no nausea, no diarrhea   CNS - no visual deficit, generalized  weakness    Physical Exam:     Vitals:    12/25/21 2253 12/26/21 0317 12/26/21 0730 12/26/21 0925   BP: 160/80 (!) 162/93 150/79    Pulse: 100 (!) 103     Resp: 16 18 19 18    Temp: 98.3 F (36.8 C) 98.1 F (36.7 C) 98 F (36.7 C)    TempSrc: Oral Oral Oral    SpO2: 98% 100% 97%    Weight:       Height:            No intake or output data in the 24 hours ending 12/26/21 0945    General: Awake, alert, oriented x 3; no acute distress.  HEENT: PERRLA, eomi, sclera anicteric, oropharynx clear without lesions, mucous membranes  moist  Neck: supple, no lymphadenopathy, no thyromegaly, no JVD, no carotid bruits  Cardiovascular: regular rate and rhythm, no murmurs, rubs or gallops  Lungs: clear to auscultation bilaterally, without wheezing, rhonchi, or rales  Abdomen: soft, non-tender, non-distended; no palpable masses, no hepatosplenomegaly, normoactive bowel sounds, no rebound or guarding  Extremities: no clubbing, cyanosis, or edema  Neuro: cranial nerves grossly intact, strength 5/5 in upper extremities except 4+/5 hand intrinsics, bilateral hip extension 2/5, bilateral knee extension 3/5, bilateral dorsiflexion 3/5, bilateral plantarflexion 4/5, paresthesias bilateral feet/hands/face but intact to light touch, could not elicit knee DTRs, follows commands.   Skin: no rashes or lesions noted  GU: no CVA tenderness     Meds:  Medications were reviewed by me:  Current Facility-Administered Medications   Medication Dose Route Frequency    amLODIPine  2.5 mg Oral Daily    apixaban  5 mg Oral Q12H SCH    bisacodyl  5 mg Oral Daily    budesonide-formoterol  2 puff Inhalation BID    lidocaine  1 patch Transdermal Q24H    lurasidone  120 mg Oral Daily    metoprolol succinate XL  25 mg Oral Daily    nortriptyline  10 mg Oral QHS    pantoprazole  40 mg Oral Daily    rOPINIRole  1 mg Oral QHS    terazosin  2 mg Oral QHS    thiamine (VITAMIN B-1) IVPB  500 mg Intravenous Q8H    Followed by    [START ON 12/28/2021] thiamine (VITAMIN B-1) IVPB  250 mg Intravenous Q24H SCH    Followed by    Melene Muller[START ON 12/31/2021] thiamine  100 mg Oral Daily       Labs:   Labs reviewed personally include:  Recent Labs   Lab 12/26/21  0536 12/24/21  1838   WBC 4.66 8.38   Hgb 7.7* 9.5*   Hematocrit 24.6* 29.9*   Platelets 331 456*    Recent Labs   Lab 12/26/21  0536   PT 13.1*   PT INR 1.1   PTT 27       Recent Labs   Lab 12/26/21  0536 12/24/21  1838   Sodium 133* 138   Potassium 2.8* 3.5   Chloride 98* 97*   CO2 24 22   BUN 6.0* 8.0   Creatinine 0.6 0.6   EGFR >60.0 >60.0    Glucose 72 97   Calcium 8.8 10.0    Recent Labs   Lab 12/24/21  1838   Alkaline Phosphatase 68   Bilirubin, Total 0.4   Protein, Total 7.1   Albumin 3.6   ALT 67*   AST (SGOT) 35            Microbiology Results (last 15 days)       Procedure Component Value Units Date/Time    Culture + Gram Azzie GlatterStain,Aerobic, CSF [[id number]][[id number]] Collected: 12/24/21 2232    Order Status: Sent Specimen: Cerebrospinal Fluid from CSF (Lumbar Puncture Spinal Fluid) Updated: 12/24/21 2252    Narrative:      Use middle tube    CSF Meningitis/Encephalitis Pathogen Panel PCR [[id number]][[id number]] Collected: 12/24/21 2232    Order Status: Completed Specimen: CSF (Lumbar Puncture Spinal Fluid) Updated: 12/25/21 1110     CSF Eschericia coli K1 by PCR Not Detected     CSF Haemophilus influenza by PCR Not Detected     CSF Listeria monocytogenes by PCR Not Detected     CSF Neisseria meningitidis (encapsulated) by PCR Not Detected     CSF Streptococcus agalactiae by PCR Not Detected     CSF Streptococcus pneumoniae by PCR Not Detected     CSF Cytomegalovirus by PCR Not Detected     CSF Enterovirus by PCR Not Detected     CSF Herpes simplex virus 1 by PCR Not Detected     CSF Herpes simplex virus 2 by PCR Not Detected     CSF Human herpesvirus 6 by PCR Not Detected     CSF Human parechovirus by PCR Not Detected     CSF Varicella zoster virus by PCR Not Detected     CSF Cryptococcus neoformans/gattii by PCR Not Detected     Comment: Test  performed with the FilmArray Meningitis/Encephalitis (ME)  PCR Panel. A negative result does not rule out central nervous  system infection and results from this panel are not intended  to be used as the sole basis for diagnosis, treatment, or  other patient management decisions. Consider pathogen specific  testing if FilmArray ME is negative but clinical suspicion is  high for a particular infection. False negative results could  be due to the presence of strains with genetic variability in  the target regions,concentration of  pathogen nucleic acid below  the limit of detection, or improper specimen handling.  This test has not been specifically evaluated with CSF from  immunocompromised patients and this test may be affected by  concurrent antimicrobial therapy. Positive results may be due  to detection of non-viable organism. Clinical correlation is  required. False positives may result from contamination during  specimen collection or processing. This test should not be  used for monitoring treatment of infection.         Narrative:      Indication for Meningitis/Encephalitis Panel PCR  Testing:->Very high clinical concern for infectious  encephalitis    COVID-19 (SARS-CoV-2) only (Liat Rapid) asymptomatic admission [[id number]] Collected: 12/24/21 2057    Order Status: Completed Specimen: Nasopharyngeal Updated: 12/24/21 2136     Purpose of COVID testing Screening     SARS-CoV-2 Specimen Source Nasal Swab     SARS CoV 2 Overall Result Not Detected     Comment: __________________________________________________  -A result of "Detected" indicates POSITIVE for the    presence of SARS CoV-2 RNA  -A result of "Not Detected" indicates NEGATIVE for the    presence of SARS CoV-2 RNA  __________________________________________________________  Test performed using the Roche cobas Liat SARS-CoV-2 assay. This assay is  only for use under the Food and Drug Administration's Emergency Use  Authorization. This is a real-time RT-PCR assay for the qualitative  detection of SARS-CoV-2 RNA. Viral nucleic acids may persist in vivo,  independent of viability. Detection of viral nucleic acid does not imply the  presence of infectious virus, or that virus nucleic acid is the cause of  clinical symptoms. Negative results do not preclude SARS-CoV-2 infection and  should not be used as the sole basis for diagnosis, treatment or other  patient management decisions. Negative results must be combined with  clinical observations, patient history, and/or  epidemiological information.  Invalid results may be due to inhibiting substances in the specimen and  recollection should occur. Please see Fact Sheets for patients and providers  located:  WirelessDSLBlog.no         Narrative:      o Collect and clearly label specimen type:  o PREFERRED-Upper respiratory specimen: One Nasal Swab in  Transport Media.  o Hand deliver to laboratory ASAP  Indication for testing->Extended care facility admission to  semi private room  Screening            Imaging personally reviewed, including:  Radiology Results (24 Hour)       ** No results found for the last 24 hours. **            Safety Checklist:     DVT prophylaxis:  CHEST guideline (See page [id number]) Chemical and Mechanical   Foley:  Shirley Rn Foley protocol Not present   IVs:  Peripheral IV   PT/OT: Ordered   Daily CBC & or Chem ordered:  SHM/ABIM guidelines (see #5) Yes, due to clinical and lab instability   Reference  for approximate charges of common labs: CBC auto diff - $76  BMP - $99  Mg - $79    Lines:          Patient Lines/Drains/Airways Status       Active PICC Line / CVC Line / PIV Line / Drain / Airway / Intraosseous Line / Epidural Line / ART Line / Line / Wound / Pressure Ulcer / NG/OG Tube       Name Placement date Placement time Site Days    Peripheral IV 12/25/21 20 G Standard Right Antecubital 12/25/21  0600  Antecubital  1                     Disposition:   Today's date: 12/26/2021  Length of Stay: 1  Anticipated medical stability for discharge: March,  8 - Afternoon  Reason for ongoing hospitalization: Generalized weakness  Anticipated discharge needs: Acute rehab    Signed by: Viviann Spare, MD

## 2021-12-26 NOTE — Plan of Care (Signed)
NURSING PROGRESS NOTE    Patient Name: [name] BirksCarol [name] (16(65 y.o. female)  Admission Date: 12/25/2021 [facility](Hospital Day 1)    Shift Note: A/O x 4, follows command, flat affect, MAE but limited in the lowers. Noted with generalized non pitting edema. C/o mid back pain. Lidocaine patch ordered and applied. Flexeril PRN given x 2, Tylenol x 1    K+ 2.8, replaced per protocol    Mag x 1    No BM in days, PRN senna given x 2, Lactulose x 1, no BM yet.    Recent Labs   Lab 12/26/21  0536   Sodium 133*   Potassium 2.8*   Chloride 98*   CO2 24   BUN 6.0*   Creatinine 0.6   EGFR >60.0   Glucose 72   Calcium 8.8       Recent Labs   Lab 12/26/21  0536   WBC 4.66   Hgb 7.7*   Hematocrit 24.6*   Platelets 331         Patient Lines/Drains/Airways Status       Active Lines, Drains and Airways       Name Placement date Placement time Site Days    Peripheral IV 12/25/21 20 G Standard Right Antecubital 12/25/21  0600  Antecubital  1                    MEWS Score: 2    Last BM:   Pending Orders: labs  Discharge Plan: tbd    Safety Checklist   Fall Precautions y   Avasys n   Seizure Precautions y   Aspiration Precautions y   Belongings Checked y     Financial controllerervice Essentials  Medication Teaching y   Purposeful Hourly Rounding y       Licensed conveyancernterpreter Services:  Does the patient require an Interpreter? n    If yes, what form of interpreter services was used? na     If family was utilized, is Tour managerinterpreter waiver form signed and in the chart? n   Problem: Neurological Deficit  Goal: Neurological status is stable or improving  Outcome: Progressing  Flowsheets (Taken 12/26/2021 1118)  Neurological status is stable or improving:   Monitor/assess/document neurological assessment (Stroke: every 4 hours)   Monitor/assess NIH Stroke Scale   Re-assess NIH Stroke Scale for any change in status   Observe for seizure activity and initiate seizure precautions if indicated   Perform CAM Assessment     Problem: Pain interferes with ability to perform ADL  Goal: Pain at adequate  level as identified by patient  Outcome: Progressing  Flowsheets (Taken 12/26/2021 1118)  Pain at adequate level as identified by patient:   Identify patient comfort function goal   Assess for risk of opioid induced respiratory depression, including snoring/sleep apnea. Alert healthcare team of risk factors identified.   Assess pain on admission, during daily assessment and/or before any "as needed" intervention(s)   Reassess pain within 30-60 minutes of any procedure/intervention, per Pain Assessment, Intervention, Reassessment (AIR) Cycle   Evaluate if patient comfort function goal is met   Evaluate patient's satisfaction with pain management progress   Offer non-pharmacological pain management interventions   Consult/collaborate with Physical Therapy, Occupational Therapy, and/or Speech Therapy   Include patient/patient care companion in decisions related to pain management as needed     Problem: Side Effects from Pain Analgesia  Goal: Patient will experience minimal side effects of analgesic therapy  Outcome: Progressing  Flowsheets (Taken 12/26/2021  1118)  Patient will experience minimal side effects of analgesic therapy:   Monitor/assess patient's respiratory status (RR depth, effort, breath sounds)   Prevent/manage side effects per LIP orders (i.e. nausea, vomiting, pruritus, constipation, urinary retention, etc.)   Assess for changes in cognitive function   Evaluate for opioid-induced sedation with appropriate assessment tool (i.e. POSS)

## 2021-12-26 NOTE — Progress Notes (Signed)
[facility] Consultation Progress Note    Date Time: 12/26/21 11:15 AM  Patient Name: [facility]  Outpatient Neurologist :    CC: LE weakness, paresthesia         Assessment:   66 y.o. female with hx of AFIB on Eliquis, HTN, HLD, DM, COPD, TIA, bipolar d/o, bipolar d/o recent rhabdomyolysis ([facility] admit 2/7-2/17/23) presented to [facility] c/o b/l leg weakness, arm/face paresthesia, recent n/v and 15 lbs weight loss. Presentation most consistent with peripheral neuropathy, possible etiologies include nutritional deficiency vs inflammatory vs other.    Patient Active Problem List   Diagnosis    CVA (cerebral vascular accident)    Chest pain    Chest pain with high risk of acute coronary syndrome    Paroxysmal atrial fibrillation    Hypertension    Hyperlipidemia    Seizure disorder    History of gastrointestinal hemorrhage    Depression    Anemia    Asthma    Non-traumatic subcutaneous emphysema    Chest pain with moderate risk for cardiac etiology    Left-sided weakness    History of asthma    History of transient ischemic attack (TIA)    Type 2 diabetes mellitus    Syncope and collapse    Weakness of both legs    Hypokalemia    Thoracic back pain    Gastroparesis       Plan:   -cont Thiamine 500 mg IV q8h x 3 days followed by 250 mg IV daily x 3 days followed by 100 mg po daily.   -EMG, tentatively Monday  -consider IVIG pending EMG results and clinical outcome after thiamine infusion.  -fu labs: motor and sensory neuropathy eval, syphillis, B6, B12, TSH, ANA, Crp, copper, Zinc, HIV, B1. prior CSF cx in process  -PT/OT    --------------------------------  Neurology Attending Addendum:    Thinks her eyes are moving a little better today. No change in strength or incoordination. On exam, no ptosis or ophthalmoparesis seen today but still some nonsustained nystagmus with rightward gaze. Strength stable with bilateral UE and LE proximal weakness. Mild-moderate dysmetria remains unchanged. Will continue thiamine and plan  for EMG, tentatively tomorrow.    Mannie Stabile, MD  [facility]    I reviewed this patient's chart and relevant neuro-imaging, laboratory results, and other diagnostic studies. I personally obtained the history, examined the patient, and formulated the plan of care. I agree with the findings and plan as outlined, with any highlights or additions as noted above.    Total time of the encounter was ?35 minutes, of which >50% was spent reviewing the chart, updating orders, coordinating care, and at the bedside counseling. I performed the substantive portion of the visit by providing more than 50% of the total encounter time, and spent ?20 minutes. Patient was also seen by Kinnie Feil, PA-C who spent ?15 minutes.  --------------------------------    Interval History/Subjective:   No new events overnight. Pt denies any improvement of weakness or paresthesia. Endorses improvement of EOM. C/o persistent pain around torso and back. Constant, 9/10, not modified by external factor.    Medications:     Current Facility-Administered Medications   Medication Dose Route Frequency    amLODIPine  2.5 mg Oral Daily    apixaban  5 mg Oral Q12H SCH    bisacodyl  5 mg Oral Daily    budesonide-formoterol  2 puff Inhalation BID    lidocaine  1 patch Transdermal Q24H  lurasidone  120 mg Oral Daily    metoprolol succinate XL  25 mg Oral Daily    nortriptyline  10 mg Oral QHS    pantoprazole  40 mg Oral Daily    rOPINIRole  1 mg Oral QHS    terazosin  2 mg Oral QHS    thiamine (VITAMIN B-1) IVPB  500 mg Intravenous Q8H    Followed by    [START ON 12/28/2021] thiamine (VITAMIN B-1) IVPB  250 mg Intravenous Q24H SCH    Followed by    Melene Muller ON 12/31/2021] thiamine  100 mg Oral Daily       Review of Systems:   Neurological ROS: negative for - behavioral changes, bowel and bladder control changes, confusion, or dizziness    Physical Exam:   Temp:  [98 F (36.7 C)-98.8 F (37.1 C)] 98 F (36.7 C)  Heart Rate:  [98-103] 102  Resp Rate:   [16-19] 18  BP: (150-167)/(79-93) 152/79    Vital Signs:  Reviewed    General: Well developed and well nourished. No acute distress. Cooperative with the exam  ENT: Normal oral mucosa, no ear or nose discharge  Neck: Symmetric, no deformities  CV: RRR  Resp: No audible wheezing, normal work of breathing  Abd: Soft, nondistended  Skin: Intact, extremities normal in color  Psych: Affect is normal, good insight    Mental Status: The patient is awake, alert and oriented to person, place, and time.  Affect is normal  Fund of knowledge appropriate  Recent and remote memory are intact   Attention span and concentration appear normal.  Language function is normal. There is no evidence of aphasia in conversational speech.    Cranial nerves:   -CN II: Visual fields full to bedside confrontation   -CN III, IV, VI: Pupils equal, round, and reactive to light; extraocular movements intact; no ptosis              -CN V: Facial sensation intact in V1 through V3 distributions   -CN VII: left facial droop  -CN VIII: Hearing intact to conversational speech   -CN IX, X: Palate elevates symmetrically; normal phonation   -CN XI: UTA  -CN XII: Tongue protrudes midline    Motor: Muscle tone normal without spasticity or flaccidity. No atrophy.       UEs:   Deltoid Bicep Tricep WE WF Grip IO   Right 5 5 5   5     Left 5 5 5   5       LEs   HF HE KF KE PF DF   Right 2 2 3 3 5 5    Left 2 2 3 3 5 5      Sensory:   Light touch slightly diminished on left arm and leg.       Reflexes:      B T BR P A   Right 0 0 0 0 0   Left 0 0 0 0 0     Plantars: equivocal    Coordination: FTN intact. No tremors    Gait: deferred      Labs:     Results       Procedure Component Value Units Date/Time    Magnesium [[id number]] Collected: 12/26/21 0536    Specimen: Blood Updated: 12/26/21 0642     Magnesium 1.6 mg/dL     GFR [[id number]] Collected: 12/26/21 0536     Updated: 12/26/21 0642     EGFR >60.0    Basic  Metabolic Panel [[id number]]  (Abnormal) Collected:  12/26/21 0536    Specimen: Blood Updated: 12/26/21 0642     Glucose 72 mg/dL      BUN 6.0 mg/dL      Creatinine 0.6 mg/dL      Calcium 8.8 mg/dL      Sodium 409 mEq/L      Potassium 2.8 mEq/L      Chloride 98 mEq/L      CO2 24 mEq/L      Anion Gap 11.0    PT/APTT [[id number]]  (Abnormal) Collected: 12/26/21 0536     Updated: 12/26/21 0629     PT 13.1 sec      PT INR 1.1     PTT 27 sec     CBC and differential [[id number]]  (Abnormal) Collected: 12/26/21 0536    Specimen: Blood Updated: 12/26/21 0616     WBC 4.66 x10 3/uL      Hgb 7.7 g/dL      Hematocrit 57.8 %      Platelets 331 x10 3/uL      RBC 3.08 x10 6/uL      MCV 79.9 fL      MCH 25.0 pg      MCHC 31.3 g/dL      RDW 16 %      MPV 8.3 fL      Instrument Absolute Neutrophil Count 2.33 x10 3/uL      Neutrophils 50.0 %      Lymphocytes Automated 38.6 %      Monocytes 8.2 %      Eosinophils Automated 1.7 %      Basophils Automated 0.6 %      Immature Granulocytes 0.9 %      Nucleated RBC 0.0 /100 WBC      Neutrophils Absolute 2.33 x10 3/uL      Lymphocytes Absolute Automated 1.80 x10 3/uL      Monocytes Absolute Automated 0.38 x10 3/uL      Eosinophils Absolute Automated 0.08 x10 3/uL      Basophils Absolute Automated 0.03 x10 3/uL      Immature Granulocytes Absolute 0.04 x10 3/uL      Absolute NRBC 0.00 x10 3/uL     Vitamin B12 [[id number]] Collected: 12/25/21 1350    Specimen: Blood Updated: 12/25/21 1645     Vitamin B-12 442 pg/mL     HIV-1/2 Ag/Ab 4th Gen. w/ Reflex [[id number]] Collected: 12/25/21 1350    Specimen: Blood Updated: 12/25/21 1637     HIV Ag/Ab, 4th Generation Non-Reactive    Syphilis Screen IgG and IgM [[id number]] Collected: 12/25/21 1350     Updated: 12/25/21 1635     Syphilis Screen IgG and IgM Nonreactive    Hemolysis index [[id number]] Collected: 12/25/21 1350     Updated: 12/25/21 1613     Hemolysis Index 2 Index     ANA IFA w reflex to Titer/Pattern/Antibody [[id number]] Collected: 12/25/21 1350     Updated: 12/25/21 1559    TSH [[id number]]  Collected: 12/25/21 1350    Specimen: Blood Updated: 12/25/21 1508     TSH 1.19 uIU/mL     C Reactive Protein [[id number]]  (Abnormal) Collected: 12/25/21 1350    Specimen: Blood Updated: 12/25/21 1443     C-Reactive Protein 2.7 mg/dL     Motor and Sensory Neuropathy Shanon Ace [[id number]] Collected: 12/25/21 1350     Updated: 12/25/21 1421    Copper, serum [[id number]] Collected: 12/25/21 1350    Specimen: Blood Updated: 12/25/21  1421    Narrative:      No pipet/use metal-free tube    Zinc [[id number]] Collected: 12/25/21 1350    Specimen: Blood Updated: 12/25/21 1421    Narrative:      Use Metal-Free tube; Spin & separate within  2hrs.    Vitamin B6 [[id number]] Collected: 12/25/21 1350    Specimen: Blood Updated: 12/25/21 1419    Narrative:      Protect from light    Whole Blood Vitamin B1 (Thiamine) [[id number]] Collected: 12/25/21 1350    Specimen: Blood Updated: 12/25/21 1419    Narrative:      Transfer whole blood to plastic shipping vial. Wrap tube in aluminum foil to  protect from light. Freeze immediatley.            Rads:   CT Head WO Contrast    Result Date: 12/24/2021   No acute intracranial process. Sandie Ano, MD  12/24/2021 8:17 PM    MRI Brain WO Contrast    Result Date: 12/25/2021  No acute intracranial abnormality. Mild chronic microvascular ischemic changes. Cavum septum pellucidum and cavum vergae. Alric Seton MD, MD  12/25/2021 2:47 AM    MRI Cervical Spine WO Contrast    Result Date: 12/25/2021  No cord compression or intrinsic cord signal abnormality. Multilevel degenerative changes as described above, with varying degrees of neural foraminal narrowing, and no central canal stenosis. Alric Seton MD, MD  12/25/2021 2:53 AM    MRI Thoracic Spine WO Contrast    Result Date: 12/25/2021  No cord compression or intrinsic cord signal abnormality. Minimal degenerative changes as described above. Alric Seton MD, MD  12/25/2021 2:56 AM    MRI Lumbar Spine WO Contrast    Result Date: 12/25/2021  1. 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 Orson Gear, MD  12/25/2021 2:54 AM    Chest AP Portable    Result Date: 12/24/2021   No acute pulmonary or pleural disease. Sandie Ano, MD  12/24/2021 7:14 PM       Signed by:  Alvis Lemmings  [facility]  Spectra link:[phone]  After 5:00 pm: [phone]  Please see attending neurology note that follows this mid-level encounter note.

## 2021-12-27 LAB — CBC AND DIFFERENTIAL
Absolute NRBC: 0 10*3/uL (ref 0.00–0.00)
Basophils Absolute Automated: 0.03 10*3/uL (ref 0.00–0.08)
Basophils Automated: 0.6 %
Eosinophils Absolute Automated: 0.04 10*3/uL (ref 0.00–0.44)
Eosinophils Automated: 0.8 %
Hematocrit: 23.7 % — ABNORMAL LOW (ref 34.7–43.7)
Hgb: 7.5 g/dL — ABNORMAL LOW (ref 11.4–14.8)
Immature Granulocytes Absolute: 0.03 10*3/uL (ref 0.00–0.07)
Immature Granulocytes: 0.6 %
Instrument Absolute Neutrophil Count: 2.92 10*3/uL (ref 1.10–6.33)
Lymphocytes Absolute Automated: 1.43 10*3/uL (ref 0.42–3.22)
Lymphocytes Automated: 28.3 %
MCH: 25.2 pg (ref 25.1–33.5)
MCHC: 31.6 g/dL (ref 31.5–35.8)
MCV: 79.5 fL (ref 78.0–96.0)
MPV: 8.3 fL — ABNORMAL LOW (ref 8.9–12.5)
Monocytes Absolute Automated: 0.61 10*3/uL (ref 0.21–0.85)
Monocytes: 12.1 %
Neutrophils Absolute: 2.92 10*3/uL (ref 1.10–6.33)
Neutrophils: 57.6 %
Nucleated RBC: 0 /100 WBC (ref 0.0–0.0)
Platelets: 327 10*3/uL (ref 142–346)
RBC: 2.98 10*6/uL — ABNORMAL LOW (ref 3.90–5.10)
RDW: 16 % — ABNORMAL HIGH (ref 11–15)
WBC: 5.06 10*3/uL (ref 3.10–9.50)

## 2021-12-27 LAB — GFR: EGFR: >60

## 2021-12-27 LAB — HEMOGLOBIN A1C
Average Estimated Glucose: 105.4 mg/dL
Hemoglobin A1C: 5.3 % (ref 4.6–5.9)

## 2021-12-27 LAB — BODY FLUID PATH REVIEW-

## 2021-12-27 LAB — BASIC METABOLIC PANEL
Anion Gap: 12 (ref 5.0–15.0)
BUN: 5 mg/dL — ABNORMAL LOW (ref 7.0–21.0)
CO2: 22 mEq/L (ref 17–29)
Calcium: 9 mg/dL (ref 8.5–10.5)
Chloride: 103 mEq/L (ref 99–111)
Creatinine: 0.6 mg/dL (ref 0.4–1.0)
Glucose: 81 mg/dL (ref 70–100)
Potassium: 3.2 mEq/L — ABNORMAL LOW (ref 3.5–5.3)
Sodium: 137 mEq/L (ref 135–145)

## 2021-12-27 LAB — ANA IFA W/REFLEX TO TITER/PATTERN/AB: ANA Screen: NEGATIVE

## 2021-12-27 LAB — COPPER, SERUM: Copper: 131 ug/dL (ref 70–175)

## 2021-12-27 LAB — MAGNESIUM: Magnesium: 2 mg/dL (ref 1.6–2.6)

## 2021-12-27 LAB — ZINC: Zinc: 48 ug/dL — ABNORMAL LOW (ref 60–130)

## 2021-12-27 MED ORDER — POTASSIUM CHLORIDE CRYS ER 20 MEQ PO TBCR
40.0000 meq | EXTENDED_RELEASE_TABLET | Freq: Two times a day (BID) | ORAL | Status: DC
Start: 2021-12-27 — End: 2022-01-03
  Administered 2021-12-27 – 2022-01-03 (×16): 40 meq via ORAL
  Filled 2021-12-27 (×4): qty 2
  Filled 2021-12-27: qty 1
  Filled 2021-12-27 (×12): qty 2

## 2021-12-27 MED ORDER — DIPHENHYDRAMINE HCL 25 MG PO CAPS
25.0000 mg | ORAL_CAPSULE | Freq: Every day | ORAL | Status: AC
Start: 2021-12-27 — End: 2021-12-31
  Administered 2021-12-27 – 2021-12-30 (×2): 25 mg via ORAL
  Filled 2021-12-27 (×3): qty 1

## 2021-12-27 MED ORDER — DIPHENHYDRAMINE HCL 50 MG/ML IJ SOLN
12.5000 mg | Freq: Once | INTRAMUSCULAR | Status: AC
Start: 2021-12-27 — End: 2021-12-27
  Administered 2021-12-27: 12.5 mg via INTRAVENOUS
  Filled 2021-12-27: qty 1

## 2021-12-27 MED ORDER — IMMUNE GLOBULIN (HUMAN) 100 MG/ML IJ/IV SOLN (WRAP)
0.4000 g/kg | Status: DC
Start: 2021-12-27 — End: 2021-12-28
  Administered 2021-12-27: 22.5 g via INTRAVENOUS
  Filled 2021-12-27: qty 200
  Filled 2021-12-27: qty 225

## 2021-12-27 MED ORDER — ACETAMINOPHEN 325 MG PO TABS
650.0000 mg | ORAL_TABLET | Freq: Every day | ORAL | Status: AC
Start: 2021-12-27 — End: 2021-12-31
  Administered 2021-12-27 – 2021-12-30 (×2): 650 mg via ORAL
  Filled 2021-12-27 (×3): qty 2

## 2021-12-27 MED ORDER — DIPHENHYDRAMINE HCL 50 MG/ML IJ SOLN
6.2500 mg | Freq: Four times a day (QID) | INTRAMUSCULAR | Status: DC | PRN
Start: 2021-12-27 — End: 2022-01-03
  Administered 2021-12-28 – 2022-01-03 (×5): 6.25 mg via INTRAVENOUS
  Filled 2021-12-27 (×6): qty 1

## 2021-12-27 MED ORDER — LACTULOSE 10 GM/15ML PO SOLN
20.0000 g | Freq: Four times a day (QID) | ORAL | Status: AC
Start: 2021-12-27 — End: 2021-12-28
  Administered 2021-12-27: 20 g via ORAL
  Filled 2021-12-27: qty 30

## 2021-12-27 NOTE — Consults (Signed)
Wound Ostomy Continence Consultation    Patient Name: [name], [name]  Consulting Service: [facility] Day: 3     Reason for Consult   IAD/MASD    Assessment & Plan   Wound Assessment:    Pt is alert and oriented, able to turn in bed. Pt is incontinent of urine and stool. States wears diapers at home. When turned, large amount of incontinent urine, cleaned up and placed Z-guard zinc barrier cream     Location: perineal, posterior and inner thighs.   Wound description: irregular, peeling, skin loss due to moisture  Periwound: moist  Odor: none  Drainage: none  Consistent with: IAD/MASD      Plan/Follow-Up:    Apply a layer of barrier cream to buttocks to protect against moisture and sheer. Only remove top layer with each soiling and reapply    Turn Q2H with use of TAPS wedges     Limit # of layers between patient and mattress to prevent moisture entrapment- Sheet, draw sheet and 1 Chux    Patient Education: Educated patient on notifying staff when wet, instructed on use of barrier cream to protect skin against moisture and sheer. Pt verbalized understanding      Objective Findings   Braden: Braden Scale Score: 13 (12/27/21 0800)  Braden Subscales:  Sensory Perceptions: Slightly limited (12/27/21 0800)  Moisture: Very moist (12/27/21 0800)  Activity: Bedfast (12/27/21 0800)  Mobility: Slightly limited (12/27/21 0800)  Nutrition: Probably inadequate (12/27/21 0800)  Friction and Shear: Potential problem (12/27/21 0800)    Ht Readings from Last 1 Encounters:   12/26/21 1.651 m (5\' 5" )     Wt Readings from Last 3 Encounters:   12/26/21 72.6 kg (160 lb)   12/24/21 76.2 kg (168 lb)   12/24/21 73.5 kg (162 lb)     Body mass index is 26.63 kg/m.    Current Diet:   Diet regular  Diet NPO time specified Except for: SIPS WITH MEDS; - Surgery/Procedure     Specialty Bed: [facility]      History of Present Illness   This is a 66 y.o. female  has a past medical history of Anemia, Asthma, Atrial fibrillation, Chronic  obstructive pulmonary disease, Convulsions, Depression, Diabetes mellitus, Hyperlipidemia, Hypertension, and TIA (transient ischemic attack)..  Admitted with Weakness of both legs.      Wound care orders entered into EMR. Care rendered.      Please page WOC team at [phone] with any new wounds and concerns.    Cammie Mcgee, BSN, Charity fundraiser, [employer]  Pager [phone]  Spectralink [phone]

## 2021-12-27 NOTE — Progress Notes (Signed)
[facility] Consultation Progress Note    Date Time: 12/27/21 10:28 AM  Patient Name: [facility]  Outpatient Neurologist :    CC: LE weakness, paresthesia         Assessment:   66 y.o. 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   - CSF slightly elevated protein (52), negative ME panel   - No improvement with high dose Thiamine    Patient Active Problem List   Diagnosis    CVA (cerebral vascular accident)    Chest pain    Chest pain with high risk of acute coronary syndrome    Paroxysmal atrial fibrillation    Hypertension    Hyperlipidemia    Seizure disorder    History of gastrointestinal hemorrhage    Depression    Anemia    Asthma    Non-traumatic subcutaneous emphysema    Chest pain with moderate risk for cardiac etiology    Left-sided weakness    History of asthma    History of transient ischemic attack (TIA)    Type 2 diabetes mellitus    Syncope and collapse    Weakness of both legs    Hypokalemia    Thoracic back pain    Gastroparesis       Plan:   - EMG planned for today  - Recommend IVIg x 5 - ordered  - Continue high dose Thiamine treatment  - Thiamine 500 mg IV q8h x 3 days followed by 250 mg IV daily x 3 days followed by 100 mg po daily.   - Awaiting remainder of labs: motor and sensory neuropathy eval, B6, ANA, copper, Zinc, B1. prior CSF cx in process  -PT/OT      Neurology Attending Addendum:    I have personally reviewed the interval history, images and pertinent test results and I have personally examined the patient and confirmed the major physical findings of this resident/NP/PA note.  Also, I have noted any changes since the note was written as well as added additional findings and recommendations.   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 [name], MD  Neuro-hospitalist  [facility]    Interval History/Subjective:   No new events overnight.   Pt denies any improvement of weakness or paresthesia. She continues to have ataxia. She reiterates that this came on suddenly. She states that she had the episode of rhabdomyolysis that was treated with steroids and then she developed weakness which brought her back to the hospital.     HbA1c 5.3%  HIV nonreactive  Vit B12 442  TSH 1.19  Syphilis nonreactive  CK 239  CRP 2.7      Medications:     Current Facility-Administered Medications   Medication Dose Route Frequency    amLODIPine  2.5 mg Oral Daily    apixaban  5 mg Oral Q12H SCH    bisacodyl  5 mg Oral Daily    budesonide-formoterol  2 puff Inhalation BID    lidocaine  1 patch Transdermal Q24H    lurasidone  120 mg Oral Daily    metoprolol succinate XL  25 mg Oral Daily    nortriptyline  10 mg Oral QHS    pantoprazole  40 mg Oral Daily    potassium chloride  40 mEq Oral BID    rOPINIRole  1 mg Oral QHS    terazosin  2 mg Oral QHS    thiamine (VITAMIN B-1) IVPB  500 mg Intravenous Q8H    Followed by    [START ON 12/28/2021] thiamine (VITAMIN B-1) IVPB  250 mg Intravenous Q24H SCH    Followed by    Melene Muller ON 12/31/2021] thiamine  100 mg Oral Daily    vitamins/minerals  1 tablet Oral Daily       Review of Systems:   Neurological ROS: negative for - behavioral changes, bowel and bladder control changes, confusion, or dizziness    Physical Exam:   Temp:   [97.6 F (36.4 C)-99.1 F (37.3 C)] 98.4 F (36.9 C)  Heart Rate:  [105-118] 114  Resp Rate:  [15-19] 18  BP: (105-169)/(66-93) 143/84    Vital Signs:  Reviewed    General: Well developed and well nourished. No acute distress. Cooperative with the exam  ENT: Normal oral mucosa, no ear or nose discharge  Neck: Symmetric, no deformities  CV: RRR  Resp: No audible wheezing, normal work of breathing  Abd: Soft, nondistended  Skin: Intact, extremities normal in color  Psych: Affect is normal, good insight    Mental Status: The patient is awake, alert and oriented to person, place, and time.  Affect is normal  Fund of knowledge appropriate  Recent and remote memory are intact   Attention span and concentration appear normal.  Speech is mildly dysarthric. There is no evidence of aphasia in conversational speech.    Cranial nerves:   -CN II: Visual fields full to bedside confrontation   -CN III, IV, VI: Pupils equal, round, and reactive to light; extraocular movements intact; no ptosis; few beats of nystagmus with rightward gaze  -CN V: Facial sensation intact in V1 through V3 distributions   -CN VII: left facial droop; decreased eye closure bilaterally  -CN VIII: Hearing intact to conversational speech   -CN IX, X: Normal phonation   -CN XI: UTA  -CN XII: Tongue protrudes midline    Motor: Muscle tone normal without spasticity or flaccidity. No atrophy.       UEs:   Deltoid Bicep Tricep Grip   Right 4 4 4 4    Left 4 4 4 4      LEs   HF HE KF KE PF DF   Right 2 2 3 3 5 5    Left 2 2 3 3 5 5      Sensory:   Light touch diminished in BLE.  Proprioception diminished in toes.   Temperature decreased bilaterally (left worse than right).      Reflexes:      B T BR P A   Right 0 0 0 0 0   Left 0  0 0 0 0     Plantars: equivocal    Coordination: Mild-mod dysmetria. No tremors    Gait: deferred      Labs:   Labs reviewed. Managed by primary team.     Results       Procedure Component Value Units Date/Time    Hemoglobin A1C  [[id number]] Collected: 12/27/21 0738    Specimen: Blood Updated: 12/27/21 1021     Hemoglobin A1C 5.3 %      Average Estimated Glucose 105.4 mg/dL     Narrative:      This is NOT the correct Test for Patients with  Hemoglobinopathy.    Magnesium [[id number]] Collected: 12/27/21 0738    Specimen: Blood Updated: 12/27/21 0813     Magnesium 2.0 mg/dL     Narrative:      This is NOT the correct Test for Patients with  Hemoglobinopathy.    GFR [[id number]] Collected: 12/27/21 0738     Updated: 12/27/21 0813     EGFR >60.0    Narrative:      This is NOT the correct Test for Patients with  Hemoglobinopathy.    Basic Metabolic Panel [[id number]]  (Abnormal) Collected: 12/27/21 0738    Specimen: Blood Updated: 12/27/21 0813     Glucose 81 mg/dL      BUN 5.0 mg/dL      Creatinine 0.6 mg/dL      Calcium 9.0 mg/dL      Sodium 324 mEq/L      Potassium 3.2 mEq/L      Chloride 103 mEq/L      CO2 22 mEq/L      Anion Gap 12.0    Narrative:      This is NOT the correct Test for Patients with  Hemoglobinopathy.    CBC and differential [[id number]]  (Abnormal) Collected: 12/27/21 0738    Specimen: Blood Updated: 12/27/21 0755     WBC 5.06 x10 3/uL      Hgb 7.5 g/dL      Hematocrit 66.4 %      Platelets 327 x10 3/uL      RBC 2.98 x10 6/uL      MCV 79.5 fL      MCH 25.2 pg      MCHC 31.6 g/dL      RDW 16 %      MPV 8.3 fL      Instrument Absolute Neutrophil Count 2.92 x10 3/uL      Neutrophils 57.6 %      Lymphocytes Automated 28.3 %      Monocytes 12.1 %      Eosinophils Automated 0.8 %      Basophils Automated 0.6 %      Immature Granulocytes 0.6 %      Nucleated RBC 0.0 /100 WBC      Neutrophils Absolute 2.92 x10 3/uL      Lymphocytes Absolute Automated 1.43 x10 3/uL      Monocytes Absolute Automated 0.61 x10 3/uL      Eosinophils Absolute Automated 0.04 x10 3/uL      Basophils Absolute Automated 0.03 x10 3/uL      Immature Granulocytes Absolute 0.03 x10 3/uL      Absolute NRBC 0.00 x10 3/uL     Narrative:      This is NOT the correct  Test for Patients with  Hemoglobinopathy.            Rads:   CT Head WO Contrast  Result Date: 12/24/2021   No acute intracranial process. Sandie Ano, MD  12/24/2021 8:17 PM    MRI Brain WO Contrast    Result Date: 12/25/2021  No acute intracranial abnormality. Mild chronic microvascular ischemic changes. Cavum septum pellucidum and cavum vergae. Alric Seton MD, MD  12/25/2021 2:47 AM    MRI Cervical Spine WO Contrast    Result Date: 12/25/2021  No cord compression or intrinsic cord signal abnormality. Multilevel degenerative changes as described above, with varying degrees of neural foraminal narrowing, and no central canal stenosis. Alric Seton MD, MD  12/25/2021 2:53 AM    MRI Thoracic Spine WO Contrast    Result Date: 12/25/2021  No cord compression or intrinsic cord signal abnormality. Minimal degenerative changes as described above. Alric Seton MD, MD  12/25/2021 2:56 AM    MRI Lumbar Spine WO Contrast    Result Date: 12/25/2021  1. 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 Orson Gear, MD  12/25/2021 2:54 AM    Chest AP Portable    Result Date: 12/24/2021   No acute pulmonary or pleural disease. Sandie Ano, MD  12/24/2021 7:14 PM       Signed by:  Barnie Mort, PA-C  Physician Assistant  [facility]  Daytime: Spectralink [phone] or EpicChat  Consult requests: Spectralink [phone]  If urgent after 7:00 pm: [phone]    Please see attending Neurologist note that accompanies this mid-level encounter note.

## 2021-12-27 NOTE — Progress Notes (Signed)
NURSING PROGRESS NOTE    Patient Name: [name] (66 y.o. female)  Admission Date: 12/25/2021 [facility] Day 2)    Shift Note:   -AO x4, FC, decreased sensation in LUE and lowers, limited BLE movement. See flowsheet for more details.  -C/o back pain. PRN gabapentin given x1, heat pack applied.  -Fall/safety precautions maintained. Purposeful rounding completed.    Recent Labs   Lab 12/26/21  0536   Sodium 133*   Potassium 2.8*   Chloride 98*   CO2 24   BUN 6.0*   Creatinine 0.6   EGFR >60.0   Glucose 72   Calcium 8.8       Recent Labs   Lab 12/26/21  0536   WBC 4.66   Hgb 7.7*   Hematocrit 24.6*   Platelets 331         Patient Lines/Drains/Airways Status       Active Lines, Drains and Airways       Name Placement date Placement time Site Days    Peripheral IV 12/25/21 20 G Standard Right Antecubital 12/25/21  0600  Antecubital  1                    MEWS Score: 2    Last BM: 3/2  Pending Orders: TBD  Discharge Plan: TBD    Safety Checklist   Fall Precautions y   Avasys n   Seizure Precautions n   Aspiration Precautions n   Belongings Checked y     Financial controller  Medication Teaching y   Purposeful Hourly Rounding n       Interpreter Services:  Does the patient require an Interpreter? n    If yes, what form of interpreter services was used? n     If family was utilized, is Tour manager form signed and in the chart? n

## 2021-12-27 NOTE — Progress Notes (Addendum)
NURSING PROGRESS NOTE    Patient Name: [name] (66 y.o. female)  Admission Date: 12/25/2021 [facility] Day 2)    Shift Note:   Pt A/O x 4, FC, MAE numbness and tingling in BUE, BLE and face.   Scheduled thiamine given. K tabs started.  C/o back pain, Lidocaine patch and heat pack applied. Started on IVIG with premeds given. Pt shortly complained of lips and tongue swelling with itchiness. IVIG stopped, attending and neurology made aware. Additional IV benadryl given.   EMG done at bedside.  Pt not on tele. Afebrile, generalized non-pitting edema.   No BM. Voiding.    NPO after midnight for cath placement.  Eliquis auto held.    Fall/safety precautions in place.    Recent Labs   Lab 12/27/21  0738   Sodium 137   Potassium 3.2*   Chloride 103   CO2 22   BUN 5.0*   Creatinine 0.6   EGFR >60.0   Glucose 81   Calcium 9.0       Recent Labs   Lab 12/27/21  0738   WBC 5.06   Hgb 7.5*   Hematocrit 23.7*   Platelets 327         Patient Lines/Drains/Airways Status       Active Lines, Drains and Airways       Name Placement date Placement time Site Days    Peripheral IV 12/25/21 20 G Standard Right Antecubital 12/25/21  0600  Antecubital  2                    MEWS Score: 2  Last BM: 3/2  Pending Orders: n/a   Discharge Plan: n/a    Safety Checklist   Fall Precautions y   Avasys n   Seizure Precautions n   Aspiration Precautions n   Belongings Checked y     Financial controller  Medication Teaching y   Purposeful Hourly Rounding y       Equities trader Services:  Does the patient require an Interpreter? n

## 2021-12-27 NOTE — OT Progress Note (Signed)
Occupational Therapy Treatment  [name]        Post Acute Care Therapy Recommendations:     Discharge Recommendations:  Acute Rehab    If Acute Rehab  recommended discharge disposition is not available, patient will need max assist for ADLs/transfers and HHOT.     DME needs IF patient is discharging home: [facility], Hospital bed, [facility]    Therapy discharge recommendations may change with patient status.  Please refer to most recent note for up-to-date recommendations.    Patient anticipated to benefit from and to be able to engage in 3 hours of therapy a day for 5 days a week.     Assessment:   Significant Findings: None    Pt received in the bed, agreeable to OT treatment session. Pt making good progress towards goals. Pt demonstrating improved sit balance this date though with some difficulty finding/reorienting to midline with LOB. Pt participates in various seated dynamic reaching exercises with emphasis on finding midline after reaching outside BOS. Pt continues to have impaired proprioception in B feet, sensory impairment greater distally > proximally including light touch and temperature. Pt able to complete one sit to stand transfer with modAx2 and maintain static stand for ~30 sec, though unable to progress to side stepping this date. Pt left in the bed with all needs in reach, all questions answered. Will continue to benefit from skilled OT to maximize functional independence and address deficits.      Treatment Activities: bed mobility, sit balance, transfer training, therex, education    Educated the patient to role of occupational therapy, plan of care, goals of therapy and HEP, safety with mobility and ADLs, home safety.    Plan:    OT Frequency Recommended: 4-5x/wk     Continue plan of care.    Unit: [facility] TOWER 7  Bed: F745/F745.01       Precautions and Contraindications:   Falls    Updated Medical Status/Imaging/Labs:  Reviewed     Subjective: "my double vision is  gone"   Patient's medical condition is appropriate for Occupational Therapy intervention at this time.  Patient is agreeable to participation in the therapy session. Nursing clears patient for therapy.    Pain:   Denies     Objective:   Patient is in bed with telemetry, external catheter in place.  Pt wore mask during therapy session:No      Cognition  A/Ox3  Answers questions appropriately: yes  Command Following: good    Functional Mobility  Rolling: minA  Supine to Sit: minA, uses arms to assist with managing LEs  Sit to Supine: maxA  Sit to Stand: modAx2    PMP Activity: Step 4 - Dangle at Bedside    Balance  Static Sitting: good-  Dynamic Sitting: fair+  Static Standing: fair    Self Care and Home Management  Eating: independent  Grooming: setup at EOB   Bathing: modA from seated   UE Dressing: SBA  LE Dressing: maxA  Toileting: maxA    Therapeutic Exercises/Activities  Seated dynamic reaching outside BOS  Reorienting to midline for improved sit balance     Participation: good   Endurance: good    Patient left with call bell within reach, all needs met, SCDs off, fall mat in place, bed alarm on, chair alarm n/a and all questions answered. RN notified of session outcome and patient response.     Goals:  Time For Goal Achievement: 7 visits  ADL Goals  Patient will groom self: Minimal Assist, at sinkside, 7 visits  Patient will dress lower body: Minimal Assist, with AE, 7 visits  Mobility and Transfer Goals  Pt will perform functional transfers: Minimal Assist, with rolling walker, 7 visits  Neuro Re-Ed Goals  Pt will perform dynamic sitting balance: Supervision, to increase ability to complete ADLs, 7 visits  Other Goal: pt will demonstrate use of visual compensatory methods 2/2 decreased LE sensation           Vision Goals  Pt with diplopia will complete ADLs with adaptive equipment: modified independent, to locate items within environment for ADLs            PPE worn during session: procedural mask and  gloves    Tech present: n/a  PPE worn by tech: N/A    [name], OTR/L  Pager [phone]      Time of Treatment  OT Received On: 12/27/21  Start Time: 1025  Stop Time: 1100  Time Calculation (min): 35 min    Treatment # 1 of 7 visits

## 2021-12-27 NOTE — Procedures (Signed)
Report of EMG Results        66 year old female with a history of atrial fibrillation (on Eliquis), HTN, HLD, DM, COPD, TIA, bipolar disorder, recent hospital admission for rhabdomyolysis was admitted with diffuse paresthesia and bilateral lower limb >> upper limb weakness.  IVIG has been ordered.  She is being followed by Neurology.  Electrodiagnostic testing was ordered to evaluate for the possibility of a demyelinating neuropathy, other peripheral neuropathy, myopathy, or other neuromuscular cause of her presentation.        Conclusions:     This is an abnormal electrodiagnostic examination.  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  There are acute denervation potentials (consistent with muscle fiber splitting) and myopathic motor unit changes that are limited to proximal lower limb muscles.  The marked difference between normal distal lower limb muscles and markedly abnormal proximal lower limb muscles is notable.  It is also notable that proximal upper limb muscles were normal.  This pattern is somewhat atypical, and not expected in a typical inflammatory myopathy.     While it seems clear from these findings that this is a myopathic process, the focal involvement of only proximal lower limb muscles is striking.  Because of these findings, combined with the patient's use of Eliquis, a pelvic or retroperitoneal hematoma - that could potentially cause injury of the lumbar plexus - should be excluded.  I would suggest consideration of CT of abdomen and pelvis to rule out hemorrhage/hematoma.     There is no evidence of a peripheral neuropathy.     IF MUSCLE BIOPSY IS CONSIDERED, EITHER RECTUS FEMORIS WOULD BE AN IDEAL TARGET OF BIOPSY.    Thank you very much for this interesting consultation.  Please don't hesitate to call with any questions or concerns.      Leonie Green, MD  PM&R            Findings:  Right Median sensory nerve conduction study recording  over the index finger shows normal latency and normal amplitude.  Right Radial sensory nerve conduction study recording over the anatomic snuffbox shows normal latency and normal amplitude.  Right Sural sensory nerve conduction study recording posterior to the lateral malleolus shows normal latency and normal amplitude.  Left Sural sensory nerve conduction study recording posterior to the lateral malleolus shows normal latency and normal amplitude.  Right Ulnar motor nerve conduction study to the abductor digiti minimi shows normal distal latency, normal amplitude, and normal conduction velocities in the forearm and across-elbow segments.  Right Common Peroneal motor nerve conduction study to the extensor digitorum brevis shows normal distal latency, normal amplitude, and normal conduction velocities in the leg and across-knee segments.  Right Tibial motor nerve conduction study to the abductor hallucis shows normal distal latency, normal amplitude, and normal conduction velocity in the leg segment.  Right Peroneal F-waves show normal minimal latency.  Right Ulnar F-waves show normal minimal latency.  Needle EMG examination of selected bilateral lower and Right upper limb muscles was performed and the results are summarized in the table below.  All testing was performed by the signing physician.  No technicians or technologists were used in the performance of this examination.           Sensory NCS    Nerve / Sites Rec. Site Onset Lat Peak Lat NP Amp PP Amp Segments Distance Velocity     ms ms V V  cm m/s   R  MEDIAN - Dig II Antidr      Wrist Dig II 2.90 3.50 28.8 258.4 Wrist - Dig II 14 48.3   R RADIAL - Wrist Antidr      Forearm Wrist 1.85 2.45 29.5 26.7 Forearm - Wrist 10 54.1   R SURAL - Lat Mall Antidr      Calf Lat Mall 2.10 2.95 15.6 10.3 Calf - Lat Mall 14 66.7   L SURAL - Lat Mall Antidr      Calf Lat Mall 2.15 3.15 8.9 5.4 Calf - Lat Mall 14 65.1                   Motor NCS    Nerve / Sites Rec. Site Lat  Amp Rel Amp Segments Dist. Vel     ms mV %  cm m/s   R ULNAR - ADM      Wrist ADM 3.30 8.1 100 Wrist - ADM 8       B.Elbow ADM 7.95 7.4 90.9 B.Elbow - Wrist 24 51.6   R COMM PERONEAL - EDB      Ankle EDB 3.95 2.4 100 Ankle - EDB 8       Fib Head EDB 11.35 2.0 81.6 Fib Head - Ankle 33 44.6      Knee EDB 13.00 1.9 76.6 Knee - Fib Head 7.5 45.5   R TIBIAL (KNEE) - AH      Ankle AH 4.15 6.1 100 Ankle - AH 8       Knee AH 12.50 4.3 69.8 Knee - Ankle 38 45.5             F  Wave    Nerve Fmin    ms   R COMM PERONEAL 51.90   R ULNAR 28.50           Reference values for nerve conduction studies are based on:   Chen S et al., Electrodiagnostic Reference Values for Upper and Lower Limb Nerve Conduction Studies in Adult Populations. Muscle & Nerve 2016; 54: 371-377.  Limb temperature was continuously monitored during the performance of all nerve conduction studies. Where needed, limbs were warmed to maintain skin temperature > 32 C (upper limbs) or > 30 C (lower limbs).    Needle EMG    EMG Summary Table     Spontaneous MUAP Recruitment    IA Fib PSW Fasc  Amp Dur. PPP Pattern   R. TIB ANTERIOR N None None None  N N N N   R. GASTROCN(MED) N None None None  N N N N   R. EXT HALL LONGUS N None None None  N N N N   R. VASTUS MEDIALIS Incr 3+ 3+ None  Decr Decr N Early   R. RECT FEMORIS Incr 3+ 3+ None  Decr Decr 2+ Early   L. RECT FEMORIS Incr 3+ 3+ None  N Decr 1+ Early   L. ADD LONGUS Incr 3+ 3+ None  Decr Decr 1+ Early   L. TIB ANTERIOR N None None None  N N N N   R. FIRST D INTEROSS N None None None  N N N N   R. DELTOID N None None None  N N N N   R. BICEPS N None None None  N N N N   R. ADD LONGUS Incr 3+ 3+ None  Decr Decr 1+ Early

## 2021-12-27 NOTE — Progress Notes (Signed)
CASE MANAGEMENT PROGRESS NOTE      Date Time: 12/27/21 10:39 AM  Patient Name: [name]  Attending Physician: Viviann Spare, MD  Hospital Day: 2    Date of Admission:  12/25/2021    Reason for Admission:  Weakness of both legs [R29.898]    CM chart review complete. Pt presented from [facility] with new bilateral hand and face paresthesias and bilateral hand weakness, some SOB. Pt transferred to [facility] for further neuro workup, with EMG and possible consideration for IVIG.    CM met with patient.  Pt is AO x's 4. Sh reports she lives in a multi-story home with her daughter and son-in-law. Pt is normally independent at baseline, has a shower chair and occasionally uses a cane. Therapy recs are for AR. Pt is agreeable and she would like [facility], referrals sent.    BARRIERS: ins auth, medical readiness    DISPO: AR    CM following for hospital course outcomes, Interdisciplinary Team recommendations, discharge readiness and potential barriers to discharge.      Gabriel Carina, MSN RN  Case Management  [employer]  [phone]

## 2021-12-27 NOTE — Progress Notes (Signed)
[name]   CNS HOSPITALIST PROGRESS NOTE    Today's date & time: 12/27/21 3:06 PM  Patient Name: [name]  Attending Physician: [name] Spare, MD  Admission Date: 12/25/2021    Assessment:     Active [name] Problems    Diagnosis    Hypokalemia    Thoracic back pain    Gastroparesis    Weakness of both legs    Type 2 diabetes mellitus    Paroxysmal atrial fibrillation     Plan:   Difficulty walking and bilateral leg and hand weakness - patient to has multiple vague complaints who was transferred from outside [name] for consideration of EMG for [name] consideration, and possible IVIG, currently seen by neurology team here and based on her history started the patient on thiamine IV, at this time we will continue to perform EMG, possible IVIG, LP was done and came back unremarkable, got seen by PT and OT team who recommended going to acute rehab, fall precaution, continue monitoring.  Patient had NIF and VC done on 3/5 which showed low volume.  Patient continued proximal muscle weakness, areflexia, highly suspicious for GBS and neurology team started the patient on IVIG on 12/27/2021, continue monitoring, fall precautions, continue PT and OT evaluation, appreciate input from neurology team.   Upper abdominal and mid back pain - patient complaining of having some pain in the epigastric area banding leg to the mid back, she is placed on PPI, Tums, muscle relaxants, as needed pain medications, may consider placing the patient on scheduled Tylenol, will continue monitoring.   Paroxysmal atrial fibrillation - rate is controlled, patient is on apixaban.  Metoprolol.   Hypertension - blood pressure is controlled, continue monitoring.   COPD - noted, patient is getting Symbicort, in no acute respiratory distress, on oxygen.   Type 2 diabetes mellitus - borderline hemoglobin A1c in the past 2020 at 6.0, currently no treatment, will check hemoglobin A1c tomorrow morning.   Hypokalemia - replaced,  monitor closely, magnesium level is also lower, replaced magnesium, now  started BID potassium ordered.   Anemia - most likely poor nutrition and chronic disease, will check iron profile, consider iron infusion.  Multivitamins added.   Bipolar disorder - patient seem to have some issues with behavior, patient is on nortriptyline and Latuda, no acute exacerbation, continue monitoring.   Constipation -patient said last bowel meant a few days ago, stool softeners and laxatives given, follow closely    GI prophylaxis: Protonix, Maalox    Subjective     CC: Weakness of both legs    Interval History/24 hour events: Patient feels better today, back pain 6/10    HPI: HPI per Admitting Provider   " [name] is a 66 y.o. female hx afib on Eliquis, HTN, HLD, DM, COPD, TIAs, COPD, bipolar disorder, [facility] admit 2/7-2/17/23 for rhabdomyolysis (peak CK 4464 - statin d/c'ed) + vomiting ?gastroparesis + starvation ketoacidosis + UTI, [facility] admit 2/18-2/20/23 for rhabdomyolysis (peak CK 2509) + transaminitis + bilateral leg weakness + right paresthesias, [facility] ER visit 3/2 for bilateral leg weakness who presents to [name] with difficulty standing and walking, bilateral leg/hand weakness, bilateral leg/hand/face paresthesias. She noted bilateral leg weakness with her prior [facility] admission 2/7-2/17 and was readmitted for bilateral leg weakness 2/18-2/20. The past 4 days she has had difficulty standing and walking. The past 4 days she notes new bilateral hand and face paresthesias and bilateral hand weakness. She has some shortness of breath but is speaking in  full sentences. She went to [name] 3/2 and was discharged. She saw her PMD who advised she go to an [name]. At [name] she got MRI brain and CTL-spine which did not show a cause for her symptoms. [name] called neurology Dr. Francesco [name] who said to consider IVIG for 5 days, LP, EMG. The LP was done at [name]. Dr. Francesco [name] advised transfer to [name]. These symptoms are sudden  onset, moderate intensity, without alleviating factors. "     [name] course:   Following admission, patient was seen by neurology consult who performed MRI of the entire spine and brain, without contrast which came back to show no major issues except on the lumbar area L4-L5 disc disease with facet arthrosis and degenerative changes, neurology team recommended LP and CSF studies so far are unrevealing, patient continued to complain of having bandlike pain around the epigastric area & in the back, there was tenderness on the right side of the thoracic vertebral area in the muscle, Lidoderm patch applied, patient is getting Protonix and Maalox, has not had a bowel movement for that reason given lactulose and stool softeners.  Patient was sent from enema multiple [name] for consideration of IVIG with a suspicion of GBS, EMG currently pending.     Due to her history of not eating well and has been losing weight over the past few weeks, neurology team felt may be nutritional and started on on IV thiamine, patient has been requesting narcotics for the pain that she is got but not really having any tenderness on examination.  Patient did NIF and vital capacity on the morning of 12/26/2021 which came back -12 cm H2O and vital capacity 1.5 L.  Patient is not in any respiratory distress, not needing any oxygen at this time.  Patient had additional laboratory studies including HIV and syphilis which came back negative, negative for COVID 19, potassium was noted to be low for which replacement was given, CPK slightly elevated at 239.  Cultures remain negative.     Patient was seen by PT and OT team who recommended going to acute rehab.  Patient was having constipation, stool softeners and laxatives given.     Review of Systems:   General - back pain   Resp - no SOB   CVS - no CP, no CAD   GI - no nausea, no diarrhea   CNS - no visual deficit, generalized  weakness    Physical Exam:     Vitals:    12/27/21 0717 12/27/21 0834  12/27/21 0933 12/27/21 1200   BP: 156/82 143/84  135/75   Pulse: (!) 108 (!) 114  (!) 106   Resp: 18  18 20    Temp: 98.4 F (36.9 C)   98 F (36.7 C)   TempSrc: Oral   Oral   SpO2: 100%      Weight:       Height:              Intake/Output Summary (Last 24 hours) at 12/27/2021 1506  Last data filed at 12/26/2021 1858  Gross per 24 hour   Intake 222 ml   Output --   Net 222 ml       General: Awake, alert, oriented x 3; no acute distress.  HEENT: PERRLA, eomi, sclera anicteric, oropharynx clear without lesions, mucous membranes moist  Neck: supple, no lymphadenopathy, no thyromegaly, no JVD, no carotid bruits  Cardiovascular: regular rate and rhythm, no murmurs, rubs or gallops  Lungs: clear  to auscultation bilaterally, without wheezing, rhonchi, or rales  Abdomen: soft, non-tender, non-distended; no palpable masses, no hepatosplenomegaly, normoactive bowel sounds, no rebound or guarding  Extremities: no clubbing, cyanosis, or edema  Neuro: cranial nerves grossly intact, strength 5/5 in upper extremities except 3+/5 hand intrinsics, bilateral hip extension 2/5, bilateral knee extension 3/5, bilateral dorsiflexion 3/5, bilateral plantarflexion 4/5, paresthesias bilateral feet/hands/face but intact to light touch, could not elicit knee DTRs, follows commands.   Skin: no rashes or lesions noted  GU: no CVA tenderness     Meds:   Medications were reviewed by me:  Current Facility-Administered Medications   Medication Dose Route Frequency    acetaminophen  650 mg Oral Daily    amLODIPine  2.5 mg Oral Daily    apixaban  5 mg Oral Q12H SCH    bisacodyl  5 mg Oral Daily    budesonide-formoterol  2 puff Inhalation BID    diphenhydrAMINE  25 mg Oral Daily    immune globulin (human)  0.4 g/kg (Ideal) Intravenous Q24H    lactulose  20 g Oral Q6H    lidocaine  1 patch Transdermal Q24H    lurasidone  120 mg Oral Daily    metoprolol succinate XL  25 mg Oral Daily    nortriptyline  10 mg Oral QHS    pantoprazole  40 mg Oral Daily     potassium chloride  40 mEq Oral BID    rOPINIRole  1 mg Oral QHS    terazosin  2 mg Oral QHS    thiamine (VITAMIN B-1) IVPB  500 mg Intravenous Q8H    Followed by    [START ON 12/28/2021] thiamine (VITAMIN B-1) IVPB  250 mg Intravenous Q24H SCH    Followed by    Melene Muller ON 12/31/2021] thiamine  100 mg Oral Daily    vitamins/minerals  1 tablet Oral Daily       Labs:   Labs reviewed personally include:  Recent Labs   Lab 12/27/21  0738 12/26/21  0536   WBC 5.06 4.66   Hgb 7.5* 7.7*   Hematocrit 23.7* 24.6*   Platelets 327 331    Recent Labs   Lab 12/26/21  0536   PT 13.1*   PT INR 1.1   PTT 27       Recent Labs   Lab 12/27/21  0738 12/26/21  0536   Sodium 137 133*   Potassium 3.2* 2.8*   Chloride 103 98*   CO2 22 24   BUN 5.0* 6.0*   Creatinine 0.6 0.6   EGFR >60.0 >60.0   Glucose 81 72   Calcium 9.0 8.8    Recent Labs   Lab 12/24/21  1838   Alkaline Phosphatase 68   Bilirubin, Total 0.4   Protein, Total 7.1   Albumin 3.6   ALT 67*   AST (SGOT) 35            Microbiology Results (last 15 days)       Procedure Component Value Units Date/Time    Culture + Gram Azzie Glatter, CSF [[id number]] Collected: 12/24/21 2232    Order Status: Sent Specimen: Cerebrospinal Fluid from CSF (Lumbar Puncture Spinal Fluid) Updated: 12/24/21 2252    Narrative:      Use middle tube    CSF Meningitis/Encephalitis Pathogen Panel PCR [[id number]] Collected: 12/24/21 2232    Order Status: Completed Specimen: CSF (Lumbar Puncture Spinal Fluid) Updated: 12/25/21 1110     CSF Eschericia coli K1 by PCR Not Detected  CSF Haemophilus influenza by PCR Not Detected     CSF Listeria monocytogenes by PCR Not Detected     CSF Neisseria meningitidis (encapsulated) by PCR Not Detected     CSF Streptococcus agalactiae by PCR Not Detected     CSF Streptococcus pneumoniae by PCR Not Detected     CSF Cytomegalovirus by PCR Not Detected     CSF Enterovirus by PCR Not Detected     CSF Herpes simplex virus 1 by PCR Not Detected     CSF Herpes simplex virus 2 by PCR  Not Detected     CSF Human herpesvirus 6 by PCR Not Detected     CSF Human parechovirus by PCR Not Detected     CSF Varicella zoster virus by PCR Not Detected     CSF Cryptococcus neoformans/gattii by PCR Not Detected     Comment: Test performed with the FilmArray Meningitis/Encephalitis (ME)  PCR Panel. A negative result does not rule out central nervous  system infection and results from this panel are not intended  to be used as the sole basis for diagnosis, treatment, or  other patient management decisions. Consider pathogen specific  testing if FilmArray ME is negative but clinical suspicion is  high for a particular infection. False negative results could  be due to the presence of strains with genetic variability in  the target regions,concentration of pathogen nucleic acid below  the limit of detection, or improper specimen handling.  This test has not been specifically evaluated with CSF from  immunocompromised patients and this test may be affected by  concurrent antimicrobial therapy. Positive results may be due  to detection of non-viable organism. Clinical correlation is  required. False positives may result from contamination during  specimen collection or processing. This test should not be  used for monitoring treatment of infection.         Narrative:      Indication for Meningitis/Encephalitis Panel PCR  Testing:->Very high clinical concern for infectious  encephalitis    COVID-19 (SARS-CoV-2) only (Liat Rapid) asymptomatic admission [[id number]] Collected: 12/24/21 2057    Order Status: Completed Specimen: Nasopharyngeal Updated: 12/24/21 2136     Purpose of COVID testing Screening     SARS-CoV-2 Specimen Source Nasal Swab     SARS CoV 2 Overall Result Not Detected     Comment: __________________________________________________  -A result of "Detected" indicates POSITIVE for the    presence of SARS CoV-2 RNA  -A result of "Not Detected" indicates NEGATIVE for the    presence of SARS CoV-2  RNA  __________________________________________________________  Test performed using the Roche cobas Liat SARS-CoV-2 assay. This assay is  only for use under the Food and Drug Administration's Emergency Use  Authorization. This is a real-time RT-PCR assay for the qualitative  detection of SARS-CoV-2 RNA. Viral nucleic acids may persist in vivo,  independent of viability. Detection of viral nucleic acid does not imply the  presence of infectious virus, or that virus nucleic acid is the cause of  clinical symptoms. Negative results do not preclude SARS-CoV-2 infection and  should not be used as the sole basis for diagnosis, treatment or other  patient management decisions. Negative results must be combined with  clinical observations, patient history, and/or epidemiological information.  Invalid results may be due to inhibiting substances in the specimen and  recollection should occur. Please see Fact Sheets for patients and providers  located:  WirelessDSLBlog.no         Narrative:  o Collect and clearly label specimen type:  o PREFERRED-Upper respiratory specimen: One Nasal Swab in  Transport Media.  o Hand deliver to laboratory ASAP  Indication for testing->Extended care facility admission to  semi private room  Screening            Imaging personally reviewed, including:  Radiology Results (24 Hour)       ** No results found for the last 24 hours. **            Safety Checklist:     DVT prophylaxis:  CHEST guideline (See page [id number]) Chemical and Mechanical   Foley:  [facility] Rn Foley protocol Not present   IVs:  Peripheral IV   PT/OT: Ordered   Daily CBC & or Chem ordered:  SHM/ABIM guidelines (see #5) Yes, due to clinical and lab instability   Reference for approximate charges of common labs: CBC auto diff - $76  BMP - $99  Mg - $79    Lines:          Patient Lines/Drains/Airways Status       Active PICC Line / CVC Line / PIV Line / Drain / Airway / Intraosseous Line / Epidural  Line / ART Line / Line / Wound / Pressure Ulcer / NG/OG Tube       Name Placement date Placement time Site Days    Peripheral IV 12/25/21 20 G Standard Right Antecubital 12/25/21  0600  Antecubital  1                     Disposition:   Today's date: 12/27/2021  Length of Stay: 2  Anticipated medical stability for discharge: March,  8 - Afternoon  Reason for ongoing hospitalization: Generalized weakness  Anticipated discharge needs: Acute rehab    Signed by: [name] Spare, MD

## 2021-12-27 NOTE — Plan of Care (Signed)
Problem: Neurological Deficit  Goal: Neurological status is stable or improving  Outcome: Progressing  Flowsheets (Taken 12/27/2021 1334)  Neurological status is stable or improving: Monitor/assess/document neurological assessment (Stroke: every 4 hours)     Problem: Compromised Tissue integrity  Goal: Damaged tissue is healing and protected  Outcome: Progressing  Flowsheets (Taken 12/27/2021 1334)  Damaged tissue is healing and protected:   Monitor/assess Braden scale every shift   Provide wound care per wound care algorithm   Avoid shearing injuries   Keep intact skin clean and dry   Use bath wipes, not soap and water, for daily bathing   Use incontinence wipes for cleaning urine, stool and caustic drainage. Foley care as needed   Monitor external devices/tubes for correct placement to prevent pressure, friction and shearing   Utilize specialty bed     Problem: Bladder/Voiding  Goal: Perineal skin integrity is maintained or improved  Outcome: Progressing  Flowsheets (Taken 12/27/2021 1334)  Perineal skin integrity is maintained or improved:   Keep intact skin clean and dry   Use protective skin barriers to decrease potential skin breakdown   Consult/collaborate with Wound Care Nurse     Problem: Pain interferes with ability to perform ADL  Goal: Pain at adequate level as identified by patient  Flowsheets (Taken 12/27/2021 1334)  Pain at adequate level as identified by patient:   Identify patient comfort function goal   Assess for risk of opioid induced respiratory depression, including snoring/sleep apnea. Alert healthcare team of risk factors identified.   Assess pain on admission, during daily assessment and/or before any "as needed" intervention(s)   Reassess pain within 30-60 minutes of any procedure/intervention, per Pain Assessment, Intervention, Reassessment (AIR) Cycle     Problem: Side Effects from Pain Analgesia  Goal: Patient will experience minimal side effects of analgesic therapy  Flowsheets (Taken  12/27/2021 1334)  Patient will experience minimal side effects of analgesic therapy:   Monitor/assess patient's respiratory status (RR depth, effort, breath sounds)   Assess for changes in cognitive function   Prevent/manage side effects per LIP orders (i.e. nausea, vomiting, pruritus, constipation, urinary retention, etc.)

## 2021-12-27 NOTE — UM Notes (Signed)
CS[facility]       [facility] hospital   CNS HOSPITALIST PROGRESS NOTE     Today's date & time: 12/27/21 3:06 PM  Patient Name: [name],[name]      Subjective      CC: Weakness of both legs     Interval History/24 hour events: Patient feels better today, back pain 6/10     HPI: HPI per Admitting Provider   " [name] [name] is a 66 y.o. female hx afib on Eliquis, HTN, HLD, DM, COPD, TIAs, COPD, bipolar disorder, [facility] admit 2/7-2/17/23 for rhabdomyolysis (peak CK 4464 - statin d/c'ed) + vomiting ?gastroparesis + starvation ketoacidosis + UTI, [facility] admit 2/18-2/20/23 for rhabdomyolysis (peak CK 2509) + transaminitis + bilateral leg weakness + right paresthesias, [facility] ER visit 3/2 for bilateral leg weakness who presents to [facility] with difficulty standing and walking, bilateral leg/hand weakness, bilateral leg/hand/face paresthesias. She noted bilateral leg weakness with her prior [facility] admission 2/7-2/17 and was readmitted for bilateral leg weakness 2/18-2/20. The past 4 days she has had difficulty standing and walking. The past 4 days she notes new bilateral hand and face paresthesias and bilateral hand weakness. She has some shortness of breath but is speaking in full sentences. She went to [facility] ER 3/2 and was discharged. She saw her PMD who advised she go to an [facility]. At [facility] she got MRI brain and CTL-spine which did not show a cause for her symptoms. [facility] called neurology Dr. Francesco Sor who said to consider IVIG for 5 days, LP, EMG. The LP was done at [facility]. Dr. Francesco Sor advised transfer to [facility]. These symptoms are sudden onset, moderate intensity, without alleviating factors. "      Hospital course:   Following admission, patient was seen by neurology consult who performed MRI of the entire spine and brain, without contrast which came back to show no major issues except on the lumbar area L4-L5 disc disease with facet arthrosis and degenerative changes, neurology team recommended LP and CSF studies so far are  unrevealing, patient continued to complain of having bandlike pain around the epigastric area & in the back, there was tenderness on the right side of the thoracic vertebral area in the muscle, Lidoderm patch applied, patient is getting Protonix and Maalox, has not had a bowel movement for that reason given lactulose and stool softeners.  Patient was [facility] for consideration of IVIG with a suspicion of GBS, EMG currently pending.      Due to her history of not eating well and has been losing weight over the past few weeks, neurology team felt may be nutritional and started on on IV thiamine, patient has been requesting narcotics for the pain that she is got but not really having any tenderness on examination.  Patient did NIF and vital capacity on the morning of 12/26/2021 which came back -12 cm H2O and vital capacity 1.5 L.  Patient is not in any respiratory distress, not needing any oxygen at this time.  Patient had additional laboratory studies including HIV and syphilis which came back negative, negative for COVID 19, potassium was noted to be low for which replacement was given, CPK slightly elevated at 239.  Cultures remain negative.      Patient was seen by PT and OT team who recommended going to acute rehab.  Patient was having constipation, stool softeners and laxatives given.         Vitals:     12/27/21 0717   BP:  156/82   Pulse: (!) 108   Resp: 18   Temp: 98.4 F (36.9 C)   TempSrc: Oral   SpO2: 100%   Weight:            Assessment:          Active Hospital Problems     Diagnosis    Hypokalemia    Thoracic back pain    Gastroparesis    Weakness of both legs    Type 2 diabetes mellitus    Paroxysmal atrial fibrillation      Plan:   Difficulty walking and bilateral leg and hand weakness - patient to has multiple vague complaints who was transferred from outside hospital for consideration of EMG for [name] consideration, and possible IVIG, currently seen by neurology team  here and based on her history started the patient on thiamine IV, at this time we will continue to perform EMG, possible IVIG, LP was done and came back unremarkable, got seen by PT and OT team who recommended going to acute rehab, fall precaution, continue monitoring.  Patient had NIF and VC done on 3/5 which showed low volume.  Patient continued proximal muscle weakness, areflexia, highly suspicious for GBS and neurology team started the patient on IVIG on 12/27/2021, continue monitoring, fall precautions, continue PT and OT evaluation, appreciate input from neurology team.   Upper abdominal and mid back pain - patient complaining of having some pain in the epigastric area banding leg to the mid back, she is placed on PPI, Tums, muscle relaxants, as needed pain medications, may consider placing the patient on scheduled Tylenol, will continue monitoring.   Paroxysmal atrial fibrillation - rate is controlled, patient is on apixaban.  Metoprolol.   Hypertension - blood pressure is controlled, continue monitoring.   COPD - noted, patient is getting Symbicort, in no acute respiratory distress, on oxygen.   Type 2 diabetes mellitus - borderline hemoglobin A1c in the past 2020 at 6.0, currently no treatment, will check hemoglobin A1c tomorrow morning.   Hypokalemia - replaced, monitor closely, magnesium level is also lower, replaced magnesium, now  started BID potassium ordered.   Anemia - most likely poor nutrition and chronic disease, will check iron profile, consider iron infusion.  Multivitamins added.   Bipolar disorder - patient seem to have some issues with behavior, patient is on nortriptyline and Latuda, no acute exacerbation, continue monitoring.   Constipation -patient said last bowel meant a few days ago, stool softeners and laxatives given, follow closely     GI prophylaxis: Protonix, Maalox     Neurology:      Date Time: 12/27/21 10:28 AM  Patient Name: [name],[name]  Outpatient Neurologist :     CC: LE  weakness, paresthesia           Assessment:   66 y.o. 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              - CSF slightly elevated protein (52), negative ME panel              - No improvement with high dose Thiamine    Plan:   - EMG planned for today  - Recommend IVIg x 5 - ordered  - Continue high dose Thiamine treatment  - Thiamine 500 mg IV  q8h x 3 days followed by 250 mg IV daily x 3 days followed by 100 mg po daily.   - Awaiting remainder of labs: motor and sensory neuropathy eval, B6, ANA, copper, Zinc, B1. prior CSF cx in process  -PT/OT      Kendrick Ranch RN,BSN   PRN Utilization Review   [facility]   [address]   [address]   [place], [place] [address]   [place]: [phone]    Fax: [phone]

## 2021-12-27 NOTE — Progress Notes (Addendum)
[facility] [facility] Note:   Referral received/Following patient for acute inpatient rehab needs.  Will follow patient for continued acute inpatient rehab appropriateness and medical clearance.   Clydie Braun [place] MSN, RN-BC, CRRN, [employer]   Spoke with patient , [facility], with discuss her family and let CM know.  Leonette Monarch MSN-RN

## 2021-12-27 NOTE — PT Progress Note (Signed)
Physical Therapy Note    Physical Therapy Treatment  [name]      Post Acute Care Therapy Recommendations:     Discharge Recommendations:  Acute Rehab    If Acute Rehab  recommended discharge disposition is not available, patient will need max assist for mobility and HHPT.     DME needs IF patient is discharging home: [facility], Hospital bed (possible SB)    Therapy discharge recommendations may change with patient status.  Please refer to most recent note for up-to-date recommendations.    Patient anticipated to benefit from and to be able to engage in 3 hours of therapy a day for 5 days a week.      Assessment:   Significant Findings: None    Pt with continued diminished sensation and proprioception in B LEs requiring VCs to utilize visual feedback in sitting for foot placement for safety. Performed seated weight shifting ant/post and R/L lateral lean with improvement in trunk control with narrow BOS, additionally reaching across midline and outside BOS with CGA required to right self and return midline. Pt required ModAx2 to stand with genu recurvatum in stance with significant c/o dizziness following stand, unable to safely sequence weight shifting in stance. Pt continues to present with decreased strength, sensation, impaired coordination and balance deficits and would continue to benefit from skilled PT to maximize functional mobility and independence.     Assessment: Decreased UE strength, Decreased LE strength, Decreased safety/judgement during functional mobility, Decreased endurance/activity tolerance, Decreased sensation, Impaired coordination, Impaired motor control, Decreased functional mobility, Gait impairment, Decreased balance  Progress: Progressing toward goals  Prognosis: Good, With continued PT status post acute discharge  Risks/Benefits/POC Discussed with Pt/Family: With patient  Patient left without needs and call bell within reach. RN notified of session outcome.     Treatment  Activities: Neuro re-ed and Therex    Educated the patient to role of physical therapy, plan of care, goals of therapy and HEP.    Plan:   Treatment/Interventions: Exercise, Neuromuscular re-education, LE strengthening/ROM, Endurance training, Cognitive reorientation, Equipment eval/education, Bed mobility, Compensatory technique education, Patient/family training        PT Frequency: 4-5x/wk     Continue plan of care.    Unit: [facility] TOWER 7  Bed: F745/F745.01     Precautions and Contraindications:   Precautions  Other Precautions: falls, decreased B LE strength/sensation    Updated Medical Status/Imaging/Labs:   No results found.   Subjective:   Patient Goal: not stated    "My mouth feels dry."    Pain Assessment  Pain Assessment: No/denies pain    Patient's medical condition is appropriate for Physical Therapy intervention at this time.  Patient is agreeable to participation in the therapy session. Nursing clears patient for therapy.    Objective:   Observation of Patient/Vital Signs:  Patient is in bed with telemetry and female external catheter in place.  Pt wore mask during therapy session:No      Cognition/Neuro Status  Arousal/Alertness: Appropriate responses to stimuli  Attention Span: Appears intact  Orientation Level: Oriented X4  Memory: Appears intact  Following Commands: Follows all commands and directions without difficulty  Safety Awareness: minimal verbal instruction  Insights: Decreased awareness of deficits;Educated in safety awareness  Problem Solving: Assistance required to identify errors made;Assistance required to generate solutions;Assistance required to implement solutions;minimal assistance  Behavior: calm;cooperative  Motor Planning: intact  Coordination: GMC impaired       Functional Mobility:  Supine to  Sit: Contact Guard Assist;Minimal Assist;Increased Time;Increased Effort;HOB raised;to Right  Scooting to EOB: Contact Guard Assist  Sit to Supine: Maximal  Assist  Sit to Stand: Moderate Assist;Increased Time;Increased Effort (x2 VCs for B foot placement prior to stand 2/2 decreased proprioception)  Stand to Sit: Moderate Assist  Transfers  Bed to Chair: Unable to assess (Comment)  PMP - Progressive Mobility Protocol   PMP Activity: Step 4 - Dangle at Bedside     Ambulation:  Ambulation: Unable to assess (Comment)      Patient Participation: good  Patient Endurance: good    Patient left with call bell within reach, all needs met and all questions answered. RN notified of session outcome and patient response.     SCDs: in place as found  Fall mat: in place  Bed alarm: on  Chair alarm: n/a    Goals:  Goals  Goal Formulation: With patient  Time for Goal Acheivement: 10 visits  Goals: Select goal  Pt Will Roll Left: independent  Pt Will Roll Right: independent  Pt Will Go Supine To Sit: with stand by assist  Pt Will Perform Sit To Supine: with minimal assist (for LEs)  Pt Will Sit Edge of Bed: 11-15 min, modified independent  Pt Will Perform Sit to Stand: with minimal assist  Pt Will Transfer Bed/Chair: with rolling walker, with moderate assist  Pt Will Ambulate: 1-10 feet, with rolling walker, with moderate assist  Pt Will Propel Wheelchair: 51-150 feet, with stand by assist  Pt Will Demo / Request Pressure Relief: modified independent    Time of Treatment  PT Received On: 12/27/21  Start Time: 1040  Stop Time: 1115  Time Calculation (min): 35 min  Treatment # 1 out of 10 visits    PPE worn during session: procedural mask and gloves    Tech present: n/a - Session overlap with OT for safety and to maximize therapeutic benefit.   PPE worn by tech: N/A    Clent Jacks PT, DPT  Pager [phone]

## 2021-12-27 NOTE — Progress Notes (Signed)
Initial Case Management Assessment and Discharge Planning  [facility]   Patient Name: [name], [name]   Date of Birth [birth year]   Attending Physician: Viviann Spare, MD   Primary Care Physician: Malcolm Metro, MD   Length of Stay 2   Reason for Consult / Chief Complaint difficulty standing and walking, bilateral leg/hand weakness, bilateral leg/hand/face paresthesias           Situation   Admission DX:   1. 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           [place], [place] [address] [place]  Home Phone: [phone]  Mobile Phone: [phone]  Relation: Daughter      ADL/IADL's: Independent  Previous Level of function: 7 Independent     DME: Estate agent    Pharmacy:     [facility] [phone] - [address] [place], [place] - [address], AT [facility] [address] [facility] [address]  [address] [place] [address]  Phone: [phone] Fax: [phone]      Prescription Coverage: Yes    Home Health: The patient is not currently receiving home health services.    Previous SNF/AR: yes    COVID Vaccine Status: 3 doses    Date First IMM given:   UAI on file?: No  Transport for discharge? Mode of transportation: [employer]ulet/Van  Agreeable to Acute Rehab: [facility] is choice  post-discharge:  Yes     Assessment     BARRIERS TO DISCHARGE: ins auth     Recommendation   D/C Plan A: Acute Rehab    D/C Plan B: Home with family and Home with home health    D/C Plan C: Home with  family       Gabriel Carina, MSN RN  Case Management  [employer]  [phone]

## 2021-12-27 NOTE — Respiratory Progress Note (Signed)
Respiratory Therapy Patient Assessment    F745/F745.01  12/27/21 8:34 PM  RT: Charleston Ropes, RT      Admitting DX: Weakness of both legs [R29.898]    Pulmonary History: COPD    Other Pulm Hx:      Therapy ordered:       Respiratory Orders   (From admission, onward)                 Start     Ordered    12/25/21 2000  MDI/DPI  2 times daily (RT)      Comments:   All Adult patients ordered for Respiratory Therapy, i.e., inhaled meds, secretion clearance/lung expansion or Oxygen greater than 5 liters/min will be evaluated by a Respiratory Therapist and assessed per Respiratory Therapy Patient Driven Protocol.  Initial assessment and changes made per protocol can be found in the progress note section of the patient chart.    12/25/21 1530    12/25/21 2000  Resp Re-Assess Adult (RT Use Only)  2 times daily (RT)       12/25/21 1530    12/25/21 0900  NEGATIVE INSPIRATORY FORCE  Every morning (RT)       12/25/21 0654    12/25/21 0900  Vital Capacity Test  Every morning (RT)       12/25/21 0654                   IP Meds - Nasal and Inhaled (From admission, onward)      Start     Stop Status Route Frequency Ordered    12/25/21 0800  budesonide-formoterol (SYMBICORT) 80-4.5 MCG/ACT inhaler 2 puff         -- Dispensed IN RT - 2 times daily 12/25/21 0651    12/25/21 0647  albuterol sulfate HFA (PROVENTIL) inhaler 2 puff         -- Dispensed IN RT - Every 6 hours as needed 12/25/21 0651               PT able to take deep breath? Yes            Surgical Status: None  Airway: Natural   Mobility: Non-ambulatory, can reposition self  CXR: no acute disease    Cough Effort: Moderate            Can clear secretions with cough? Yes  Can clear secretions with suctioning? Yes     Social History     Tobacco Use   Smoking Status Never   Smokeless Tobacco Never        Breath Sounds:  Bilateral Breath Sounds: Diminished, Clear          Heart Rate: (!) 112 Resp Rate: 16  SpO2: 100 % O2 Device: None (Room air)          Home regimen:  Home  Treatments: y  Home Oxygen: n   Home CPAP/BiLevel: n    Criteria for therapy:  Secretion Clearance: None indicated  Lung Expansion: None indicated  Medications: Home regimen (pt at baseline therapy)    Recommendations/Interventions:  Recommendations/Interventions: Continue home regimen     Expected Outcomes:               Re-Evaluation:  Follow-up Date:   Improving with Therapy: Yes    Plan of Care Recommendations:  Plan of Care: symbicort self administer     Patient instructed in self-administration of inhalers, verbalizes correct dosage, frequency and side effects. Patient is able to  give return demonstration. The order was changed to self-administeration  per RT Patient Driven Protocol. If you have any questions about this assessment, please contact the RT Charge Therapist at [phone]. Thank you

## 2021-12-27 NOTE — Progress Notes (Signed)
Morning NIF and VC completed with good patient's efforts. Patient is stable with clear breath sounds through out all lung fields, Vital signs are stable with 98% saturation on room air. Attending physician made aware of the results. Will continue to monitor.        12/27/21 0933   Respiratory Parameters - Non Ventilated   Status Completed   Resp Rate 18   Vital capacity 1.86 L   $ Vital Capacity Performed? Yes   Negative inspiratory force -14 cm H2o   $ NIF DONE Yes   Equipment labeled Yes   Adverse Reactions None

## 2021-12-27 NOTE — Plan of Care (Signed)
Problem: Moderate/High Fall Risk Score >5  Goal: Patient will remain free of falls  Outcome: Progressing  Flowsheets (Taken 12/26/2021 2000)  High (Greater than 13):   HIGH-Visual cue at entrance to patient's room   HIGH-Bed alarm on at all times while patient in bed   HIGH-Utilize chair pad alarm for patient while in the chair   HIGH-Apply yellow "Fall Risk" arm band   HIGH-Initiate use of floor mats as appropriate   HIGH-Consider use of low bed     Problem: Neurological Deficit  Goal: Neurological status is stable or improving  Outcome: Progressing  Flowsheets (Taken 12/27/2021 0136)  Neurological status is stable or improving:   Monitor/assess/document neurological assessment (Stroke: every 4 hours)   Re-assess NIH Stroke Scale for any change in status   Perform CAM Assessment   Monitor/assess NIH Stroke Scale     Problem: Moderate/High Fall Risk Score >5  Goal: Patient will remain free of falls  Outcome: Progressing  Flowsheets (Taken 12/26/2021 2000)  High (Greater than 13):   HIGH-Visual cue at entrance to patient's room   HIGH-Bed alarm on at all times while patient in bed   HIGH-Utilize chair pad alarm for patient while in the chair   HIGH-Apply yellow "Fall Risk" arm band   HIGH-Initiate use of floor mats as appropriate   HIGH-Consider use of low bed     Problem: Neurological Deficit  Goal: Neurological status is stable or improving  Outcome: Progressing  Flowsheets (Taken 12/27/2021 0136)  Neurological status is stable or improving:   Monitor/assess/document neurological assessment (Stroke: every 4 hours)   Re-assess NIH Stroke Scale for any change in status   Perform CAM Assessment   Monitor/assess NIH Stroke Scale     Problem: Pain interferes with ability to perform ADL  Goal: Pain at adequate level as identified by patient  Outcome: Progressing  Flowsheets (Taken 12/27/2021 0137)  Pain at adequate level as identified by patient:   Identify patient comfort function goal   Assess for risk of opioid induced  respiratory depression, including snoring/sleep apnea. Alert healthcare team of risk factors identified.   Assess pain on admission, during daily assessment and/or before any "as needed" intervention(s)   Offer non-pharmacological pain management interventions   Evaluate patient's satisfaction with pain management progress   Evaluate if patient comfort function goal is met

## 2021-12-28 ENCOUNTER — Inpatient Hospital Stay: Payer: 59

## 2021-12-28 ENCOUNTER — Encounter
Admission: AD | Disposition: A | Discharge status: Rehabilitation Facility | Payer: Self-pay | Source: Other Acute Inpatient Hospital | Attending: Internal Medicine

## 2021-12-28 DIAGNOSIS — D539 Nutritional anemia, unspecified: Secondary | ICD-10-CM

## 2021-12-28 DIAGNOSIS — R29898 Other symptoms and signs involving the musculoskeletal system: Secondary | ICD-10-CM

## 2021-12-28 DIAGNOSIS — K3184 Gastroparesis: Secondary | ICD-10-CM

## 2021-12-28 DIAGNOSIS — E876 Hypokalemia: Secondary | ICD-10-CM

## 2021-12-28 DIAGNOSIS — I480 Paroxysmal atrial fibrillation: Secondary | ICD-10-CM

## 2021-12-28 DIAGNOSIS — E782 Mixed hyperlipidemia: Secondary | ICD-10-CM

## 2021-12-28 DIAGNOSIS — G610 Guillain-Barre syndrome: Secondary | ICD-10-CM

## 2021-12-28 HISTORY — PX: TRIPLE LUMEN DIALYSIS CATH: IMG2662

## 2021-12-28 LAB — CBC AND DIFFERENTIAL
Absolute NRBC: 0 10*3/uL (ref 0.00–0.00)
Basophils Absolute Automated: 0.02 10*3/uL (ref 0.00–0.08)
Basophils Automated: 0.3 %
Eosinophils Absolute Automated: 0.07 10*3/uL (ref 0.00–0.44)
Eosinophils Automated: 1.2 %
Hematocrit: 27.6 % — ABNORMAL LOW (ref 34.7–43.7)
Hgb: 8.8 g/dL — ABNORMAL LOW (ref 11.4–14.8)
Immature Granulocytes Absolute: 0.04 10*3/uL (ref 0.00–0.07)
Immature Granulocytes: 0.7 %
Instrument Absolute Neutrophil Count: 3.08 10*3/uL (ref 1.10–6.33)
Lymphocytes Absolute Automated: 1.96 10*3/uL (ref 0.42–3.22)
Lymphocytes Automated: 33.3 %
MCH: 25.9 pg (ref 25.1–33.5)
MCHC: 31.9 g/dL (ref 31.5–35.8)
MCV: 81.2 fL (ref 78.0–96.0)
MPV: 8.6 fL — ABNORMAL LOW (ref 8.9–12.5)
Monocytes Absolute Automated: 0.71 10*3/uL (ref 0.21–0.85)
Monocytes: 12.1 %
Neutrophils Absolute: 3.08 10*3/uL (ref 1.10–6.33)
Neutrophils: 52.4 %
Nucleated RBC: 0 /100 WBC (ref 0.0–0.0)
Platelets: 403 10*3/uL — ABNORMAL HIGH (ref 142–346)
RBC: 3.4 10*6/uL — ABNORMAL LOW (ref 3.90–5.10)
RDW: 16 % — ABNORMAL HIGH (ref 11–15)
WBC: 5.88 10*3/uL (ref 3.10–9.50)

## 2021-12-28 LAB — BASIC METABOLIC PANEL
Anion Gap: 11 (ref 5.0–15.0)
BUN: 4 mg/dL — ABNORMAL LOW (ref 7.0–21.0)
CO2: 23 mEq/L (ref 17–29)
Calcium: 9.4 mg/dL (ref 8.5–10.5)
Chloride: 106 mEq/L (ref 99–111)
Creatinine: 0.6 mg/dL (ref 0.4–1.0)
Glucose: 89 mg/dL (ref 70–100)
Potassium: 3.7 mEq/L (ref 3.5–5.3)
Sodium: 140 mEq/L (ref 135–145)

## 2021-12-28 LAB — IGA
Immunoglobulin A: 435 mg/dL (ref 69–517)
Immunoglobulin A: 318 mg/dL (ref 69–517)

## 2021-12-28 LAB — CK: Creatine Kinase (CK): 154 U/L (ref 29–233)

## 2021-12-28 LAB — HSV, TYPE 1 & 2 DNA, QUALITATIVE REAL-TIME PCR, CSF
HSV 1 DNA, QL PCR, CSF: NOT DETECTED
HSV 2 DNA, QL PCR, CSF: NOT DETECTED

## 2021-12-28 LAB — VITAMIN D,25 OH,TOTAL: Vitamin D, 25 OH, Total: 10 ng/mL — ABNORMAL LOW (ref 30–100)

## 2021-12-28 LAB — GFR: EGFR: >60

## 2021-12-28 SURGERY — TRIPLE LUMEN DIALYSIS CATH
Anesthesia: IV Sedation

## 2021-12-28 MED ORDER — LORAZEPAM 2 MG/ML IJ SOLN
1.0000 mg | Freq: Once | INTRAMUSCULAR | Status: AC | PRN
Start: 2021-12-28 — End: 2021-12-30
  Administered 2021-12-30: 1 mg via INTRAVENOUS
  Filled 2021-12-28: qty 1

## 2021-12-28 SURGICAL SUPPLY — 9 items
KIT CATH PU LG MHRKR ELT 12.5FR 16CM 3 (Catheter)
KIT CATH PU LG MHRKR ELT DBL-D 12.5FR 20 (Catheter) ×1
KIT CATHETER L16 CM OD12.5 FR LARGE (Catheter)
KIT CATHETER L16 CM OD12.5 FR LARGE MAHURKAR ELITE POLYURETHANE 3 (Catheter) IMPLANT
KIT CATHETER L20 CM OD12.5 FR LARGE (Catheter) ×1
KIT CATHETER L20 CM OD12.5 FR LARGE MAHURKAR ELITE DOUBLE-D (Catheter) ×1 IMPLANT
KIT INTRO .018IN STD MRT MK 4FR 21GA 10 (Introducer) ×1
KIT INTRODUCER L10 CM .018 IN STANDARD (Introducer) ×1
KIT INTRODUCER L10 CM .018 IN STANDARD GUIDEWIRE NEEDLE MINI ACCESS (Introducer) ×1 IMPLANT

## 2021-12-28 NOTE — Progress Notes (Signed)
NURSING PROGRESS NOTE    Patient Name: [name] (66 y.o. female)  Admission Date: 12/25/2021 [facility] Day 3)    Shift Note:   Pt A/O x 4, FC, MAE, numbness and tingling in BUE, BLE and face. See flowsheet for further details.   Pt taken to IR- [place] catheter placed.  CT scan done.   C/o pain s/p cath placement, prn pain medications x2 given. Scheduled Lidocaine patch in place.  Pt not on tele. Afebrile, generalized non-pitting edema.   Pt having frequent BM's and voiding, barrier cream applied.    Fall/safety precautions in place.    Recent Labs   Lab 12/28/21  0520   Sodium 140   Potassium 3.7   Chloride 106   CO2 23   BUN 4.0*   Creatinine 0.6   EGFR >60.0   Glucose 89   Calcium 9.4         Recent Labs   Lab 12/28/21  0520   WBC 5.88   Hgb 8.8*   Hematocrit 27.6*   Platelets 403*           Patient Lines/Drains/Airways Status       Active Lines, Drains and Airways       Name Placement date Placement time Site Days    Temporary Catheter with Pigtail 12/28/21 Internal Jugular Right 12/28/21  1015  --  less than 1    Peripheral IV 12/25/21 20 G Standard Right Antecubital 12/25/21  0600  Antecubital  3                    MEWS Score: 2  Last BM: 3/7  Pending Orders: n/a   Discharge Plan: n/a    Safety Checklist   Fall Precautions y   Avasys n   Seizure Precautions n   Aspiration Precautions n   Belongings Checked y     Financial controller  Medication Teaching y   Purposeful Hourly Rounding y       Equities trader Services:  Does the patient require an Interpreter? n

## 2021-12-28 NOTE — Progress Notes (Signed)
CASE MANAGEMENT PROGRESS NOTE      Date Time: 12/28/21 11:50 AM  Patient Name: [name]  Attending Physician: Viviann Spare, MD  Hospital Day: 3    Date of Admission:  12/25/2021    Reason for Admission:  Weakness of both legs [R29.898]    CM chart review. Pt had reaction to IVIG yesterday. Central line place for possible plex .     BARRIERS: medical readiness, ins auth    DISPO: AR    CM following for hospital course outcomes, Interdisciplinary Team recommendations, discharge readiness and potential barriers to discharge.      Gabriel Carina, MSN RN  Case Management  [employer]  [phone]

## 2021-12-28 NOTE — Brief Op Note (Signed)
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 Tip is deep in the right atrium ( only place where we could get decent flows). Catheter ready for use.    Complications:   none      Signed by: Tamala Bari, MD, MD                                                                              FX CARDIAC CATH

## 2021-12-28 NOTE — Plan of Care (Signed)
Problem: Moderate/High Fall Risk Score >5  Goal: Patient will remain free of falls  Outcome: Progressing  Flowsheets (Taken 12/28/2021 0800)  High (Greater than 13):   HIGH-Bed alarm on at all times while patient in bed   HIGH-Apply yellow "Fall Risk" arm band   HIGH-Initiate use of floor mats as appropriate     Problem: Compromised Tissue integrity  Goal: Damaged tissue is healing and protected  Flowsheets (Taken 12/28/2021 1502)  Damaged tissue is healing and protected:   Monitor/assess Braden scale every shift   Provide wound care per wound care algorithm   Reposition patient every 2 hours and as needed unless able to reposition self   Increase activity as tolerated/progressive mobility   Avoid shearing injuries   Relieve pressure to bony prominences for patients at moderate and high risk   Use bath wipes, not soap and water, for daily bathing   Keep intact skin clean and dry   Use incontinence wipes for cleaning urine, stool and caustic drainage. Foley care as needed   Monitor external devices/tubes for correct placement to prevent pressure, friction and shearing   Utilize specialty bed     Problem: Bladder/Voiding  Goal: Perineal skin integrity is maintained or improved  Outcome: Progressing  Flowsheets (Taken 12/27/2021 1334)  Perineal skin integrity is maintained or improved:   Keep intact skin clean and dry   Use protective skin barriers to decrease potential skin breakdown   Consult/collaborate with Wound Care Nurse     Problem: Neurological Deficit  Goal: Neurological status is stable or improving  Outcome: Progressing  Flowsheets (Taken 12/27/2021 1334)  Neurological status is stable or improving: Monitor/assess/document neurological assessment (Stroke: every 4 hours)     Problem: Pain interferes with ability to perform ADL  Goal: Pain at adequate level as identified by patient  Outcome: Progressing  Flowsheets (Taken 12/28/2021 1502)  Pain at adequate level as identified by patient:   Identify patient comfort  function goal   Assess for risk of opioid induced respiratory depression, including snoring/sleep apnea. Alert healthcare team of risk factors identified.   Assess pain on admission, during daily assessment and/or before any "as needed" intervention(s)   Reassess pain within 30-60 minutes of any procedure/intervention, per Pain Assessment, Intervention, Reassessment (AIR) Cycle   Evaluate if patient comfort function goal is met   Evaluate patient's satisfaction with pain management progress   Offer non-pharmacological pain management interventions     Problem: Side Effects from Pain Analgesia  Goal: Patient will experience minimal side effects of analgesic therapy  Outcome: Progressing  Flowsheets (Taken 12/28/2021 1502)  Patient will experience minimal side effects of analgesic therapy: Monitor/assess patient's respiratory status (RR depth, effort, breath sounds)

## 2021-12-28 NOTE — Plan of Care (Signed)
Pt alert and oriented x4. Flat affect; cooperative. RA. Reports numbness and tingling in all extremities. Follows commands. Clear speech. Neuro checks every 4 hours. Laxative held. PT had 3 soft bowel movements. NPO after midnight for cath placement. No pain medication needed. Fall and safety precautions maintained.     Problem: Neurological Deficit  Goal: Neurological status is stable or improving  Outcome: Progressing  Flowsheets (Taken 12/27/2021 1334 by Henrine Screws, RN)  Neurological status is stable or improving: Monitor/assess/document neurological assessment (Stroke: every 4 hours)     Problem: Peripheral Neurovascular Impairment  Goal: Extremity color, movement, sensation are maintained or improved  Outcome: Progressing  Flowsheets (Taken 12/28/2021 0421)  Extremity color, movement, sensation are maintained or improved:   Increase mobility as tolerated/progressive mobility   Assess and monitor application of corrective devices (cast, brace, splint), check skin integrity   Assess extremity for proper alignment     Problem: Compromised Hemodynamic Status  Goal: Vital signs and fluid balance maintained/improved  Outcome: Progressing  Flowsheets (Taken 12/28/2021 0421)  Vital signs and fluid balance are maintained/improved:   Position patient for maximum circulation/cardiac output   Monitor/assess vitals and hemodynamic parameters with position changes   Monitor and compare daily weight   Monitor intake and output. Notify LIP if urine output is less than 30 mL/hour.   Monitor/assess lab values and report abnormal values     Problem: Nutrition  Goal: Nutritional intake is adequate  Outcome: Progressing  Flowsheets (Taken 12/28/2021 0421)  Nutritional intake is adequate:   Monitor daily weights   Assist patient with meals/food selection   Allow adequate time for meals     Problem: Pain interferes with ability to perform ADL  Goal: Pain at adequate level as identified by patient  Outcome: Progressing  Flowsheets (Taken  12/27/2021 1334 by Henrine Screws, RN)  Pain at adequate level as identified by patient:   Identify patient comfort function goal   Assess for risk of opioid induced respiratory depression, including snoring/sleep apnea. Alert healthcare team of risk factors identified.   Assess pain on admission, during daily assessment and/or before any "as needed" intervention(s)   Reassess pain within 30-60 minutes of any procedure/intervention, per Pain Assessment, Intervention, Reassessment (AIR) Cycle     Problem: Side Effects from Pain Analgesia  Goal: Patient will experience minimal side effects of analgesic therapy  Outcome: Progressing

## 2021-12-28 NOTE — H&P (Signed)
BRIEF IR HISTORY AND PHYSICAL    Date Time: 12/28/21 9:29 AM    INDICATIONS:   Procedure(s):  TRIPLE LUMEN DIALYSIS CATH      PROCEDURALIST COMMENTS BELOW:   89F here for trialysis placement for plasma exchange for GBS.    PAST MEDICAL HISTORY:     Past Medical History:   Diagnosis Date    Anemia     Asthma     Atrial fibrillation     Chronic obstructive pulmonary disease     Convulsions     Depression     Diabetes mellitus     diet controlled     Hyperlipidemia     Hypertension     TIA (transient ischemic attack)        PAST SURGICAL HISTORY     Past Surgical History:   Procedure Laterality Date    CARDIAC CATHETERIZATION  2017    HYSTERECTOMY      pci         Family History:     Family History   Problem Relation Age of Onset    Stroke Maternal Grandmother        Social History:     Social History     Socioeconomic History    Marital status: Divorced     Spouse name: Not on file    Number of children: Not on file    Years of education: Not on file    Highest education level: Not on file   Occupational History    Not on file   Tobacco Use    Smoking status: Never    Smokeless tobacco: Never   Vaping Use    Vaping Use: Never used   Substance and Sexual Activity    Alcohol use: Never    Drug use: Never    Sexual activity: Not on file   Other Topics Concern    Dietary supplements / vitamins Not Asked    Anesthesia problems Not Asked    Blood thinners Not Asked    Pregnant Not Asked    Future Children Not Asked    Number of Pregnancies? Not Asked    Number of children Not Asked    Miscarriages / Abortions? Not Asked    Eats large amounts Not Asked    Excessive Sweets Not Asked    Skips meals Not Asked    Eats excessive starches Not Asked    Snacks or grazes Not Asked    Emotional eater Not Asked    Eats fried food Not Asked    Eats fast food Not Asked    Diet Center Not Asked    Doylene Bode Not Asked    LA Weight Loss Not Asked    Nutri-System Not Asked    Opti-Fast / Medi-Fast Not Asked    Overeaters Anonymous Not  Asked    [facility] Not Asked    TOPS Not Asked    Weight Watchers Not Asked    Atkins Not Asked    Binging / Purging Not Asked    Calorie Counting Not Asked    Fasting Not Asked    High Protein Not Asked    Low Carb Not Asked    Low Fat Not Asked    [facility] Diet Not Asked    Slim Fast Not Asked    [facility] Not Asked    Stationary cycle or treadmill Not Asked    Gym/fitness Classes Not Asked  Home exercise/video Not Asked    Swimming Not Asked    Weight training Not Asked    Walking or running Not Asked    Hospitalization Not Asked    Hypnosis Not Asked    Physical therapy Not Asked    Psychological therapy Not Asked    Residential program Not Asked    Acutrim No    Byetta No    Contrave No    Dexatrim No    Diethylpropion No    Fastin No    Fen - Phen No    Ionamin / Adipex No    Phentermine No    Qsymia No    Prozac No    Saxenda No    Topamax No    Wellbutrin No    Xenical (Orlistat, Alli) No    Other Med No    No impairment Not Asked    Walks with cane/crutch Not Asked    Requires a wheelchair Not Asked    Bedridden Not Asked    Are Neyra Pettie currently being treated for depression? Not Asked    Do Januel Doolan snore? Not Asked    Are Florenda Watt receiving any medical or psychological services? Not Asked    Do Braiden Presutti ever wake up at night gasping for breath? Not Asked    Do Margaretann Abate have or have Nyja Westbrook been treated for an eating disorder? Not Asked    Anyone ever told Zoanne Newill that Betina Puckett stop breathing while asleep? Not Asked    Do Autumn Gunn exercise regularly? Not Asked    Have Maryfer Tauzin or family member ever have trouble with anesthesia? Not Asked   Social History Narrative    Not on file     Social Determinants of Health     Financial Resource Strain: Low Risk     Difficulty of Paying Living Expenses: Not hard at all   Food Insecurity: No Food Insecurity    Worried About Programme researcher, broadcasting/film/video in the Last Year: Never true    Barista in the Last Year: Never true   Transportation Needs: Actor (Medical): Yes    Lack of Transportation (Non-Medical): Yes   Physical Activity: Insufficiently Active    Days of Exercise per Week: 2 days    Minutes of Exercise per Session: 20 min   Stress: Stress Concern Present    Feeling of Stress : To some extent   Social Connections: Moderately Integrated    Frequency of Communication with Friends and Family: More than three times a week    Frequency of Social Gatherings with Friends and Family: Twice a week    Attends Religious Services: More than 4 times per year    Active Member of [employer] or Organizations: Yes    Attends Engineer, structural: More than 4 times per year    Marital Status: Divorced   Catering manager Violence: Not At Risk    Fear of Current or Ex-Partner: No    Emotionally Abused: No    Physically Abused: No    Sexually Abused: No   Housing Stability: Not on file         REVIEW OF SYSTEMS REVIEWED:   YES  ( x )        HOME MEDICATIONS     Prior to Admission medications    Medication Sig Start Date End Date Taking? Authorizing Provider   acetaminophen (TYLENOL) 325 MG tablet Take 2 tablets (650 mg  total) by mouth every 4 (four) hours as needed for Pain 08/29/18   Kolycheva, Galina N, DO   albuterol (PROVENTIL HFA) 108 (90 Base) MCG/ACT inhaler Inhale 1 puff into the lungs daily Also take it PRN every 20 minutes.    [provider]   amLODIPine (NORVASC) 2.5 MG tablet Take 1 tablet (2.5 mg total) by mouth daily 01/10/19   Mahlon Gammon, Belva Crome, MD   apixaban (ELIQUIS) 5 MG Take 1 tablet (5 mg total) by mouth every 12 (twelve) hours 01/09/19   Yates Decamp, MD   atorvastatin (LIPITOR) 40 MG tablet Take 40 mg by mouth daily    [provider]   budesonide-formoterol (SYMBICORT) 80-4.5 MCG/ACT inhaler Inhale 2 puffs into the lungs daily    [provider]   butalbital-acetaminophen-caffeine (FIORICET) 50-325-40 MG per tablet Take 1 tablet by mouth every 6 (six) hours as needed for Headaches 09/10/20    Dot Been, MD   furosemide (LASIX) 20 MG tablet Take 1 tablet (20 mg total) by mouth daily  Patient taking differently: Take 20 mg by mouth 2 (two) times daily Patient states she takes lasix every other day 08/30/18   Earl Many, DO   furosemide (LASIX) 40 MG tablet Take 40 mg by mouth 2 (two) times daily 10/28/21   [provider]   lamoTRIgine (LAMICTAL) 150 MG tablet Take 1 tablet (150 mg total) by mouth 2 (two) times daily  Patient taking differently: Take 150 mg by mouth daily    08/29/18   Earl Many, DO   Latuda 120 MG Tab Take 120 mg by mouth daily 12/06/21   [provider]   LORazepam (Ativan) 0.5 MG tablet Take 0.5 mg by mouth daily as needed for Anxiety    [provider]   Lurasidone HCl (Latuda) 40 MG Tab Take 40 mg by mouth nightly    [provider]   metoprolol succinate XL (TOPROL-XL) 25 MG 24 hr tablet Take 25 mg by mouth daily    [provider]   Motegrity 2 MG Tab Take 2 mg by mouth daily 11/30/21   [provider]   Ozempic, 1 MG/DOSE, 4 MG/3ML Solution Pen-injector  11/29/21   [provider]   pantoprazole (PROTONIX) 40 MG tablet Take 1 tablet (40 mg total) by mouth daily 08/29/18   Earl Many, DO   QUEtiapine (SEROQUEL) 50 MG tablet Take 1 tablet (50 mg total) by mouth nightly 08/29/18   Earl Many, DO   rOPINIRole (REQUIP) 1 MG tablet Take 1 tablet (1 mg total) by mouth nightly 08/29/18   Earl Many, DO   terazosin (HYTRIN) 2 MG capsule Take 1 capsule (2 mg total) by mouth nightly 06/17/19   Aretha Parrot, MD   Trintellix 10 MG Tab tablet Take 10 mg by mouth daily 11/29/21   [provider]         INPATIENT MEDICATIONS      Current Facility-Administered Medications   Medication Dose Route Frequency    acetaminophen  650 mg Oral Daily    amLODIPine  2.5 mg Oral Daily    [Held by provider] apixaban  5 mg Oral Q12H SCH    bisacodyl  5 mg Oral Daily    budesonide-formoterol  2 puff  Inhalation BID    diphenhydrAMINE  25 mg Oral Daily    immune globulin (human)  0.4 g/kg (Ideal) Intravenous Q24H    lactulose  20 g  Oral Q6H    lidocaine  1 patch Transdermal Q24H    lurasidone  120 mg Oral Daily    metoprolol succinate XL  25 mg Oral Daily    nortriptyline  10 mg Oral QHS    pantoprazole  40 mg Oral Daily    potassium chloride  40 mEq Oral BID    rOPINIRole  1 mg Oral QHS    terazosin  2 mg Oral QHS    thiamine (VITAMIN B-1) IVPB  250 mg Intravenous Q24H SCH    Followed by    Melene Muller ON 12/31/2021] thiamine  100 mg Oral Daily    vitamins/minerals  1 tablet Oral Daily       acetaminophen **OR** acetaminophen, albuterol sulfate HFA, alum & mag hydroxide-simethicone, calcium carbonate, diphenhydrAMINE, gabapentin, hydrALAZINE, HYDROmorphone, labetalol, magnesium sulfate, melatonin, naloxone, ondansetron **OR** ondansetron, senna-docusate      ALLERGIES:     Allergies   Allergen Reactions    Singulair [Montelukast] Itching    Immune Globulin (Human) Itching and Facial Swelling     Pt complain of itchiness and swelling of lips and tongue. Saturation fine, bp elevated.    Myoview [Technetium-29m] Itching    Contrast [Iodinated Contrast Media]     Latex     Nitroglycerin     Penicillins     Tetracyclines & Related          PREVIOUS REACTION TO SEDATION MEDICATIONS     NO ( x )   YES (  )      PHYSICAL EXAM   Local only  AIRWAY CLASSIFICATION:    CLASS I   (  )   CLASS II  ( x )    CLASS III  (  )     CLASS IV  (  )    INTUBATED (  )    CARDIAC :   RRR      LUNGS:   NLR      LABS:     Lab Results   Component Value Date/Time    WBC 5.88 12/28/2021 05:20 AM    HCT 27.6 (L) 12/28/2021 05:20 AM    PLT 403 (H) 12/28/2021 05:20 AM    INR 1.1 12/26/2021 05:36 AM    PT 13.1 (H) 12/26/2021 05:36 AM    PTT 27 12/26/2021 05:36 AM    BUN 4.0 (L) 12/28/2021 05:20 AM    CREAT 0.6 12/28/2021 05:20 AM    GLU 89 12/28/2021 05:20 AM    K 3.7 12/28/2021 05:20 AM           ASA PHYSICAL STATUS   (  )  ASA 1   HEALTHY PATIENT  (   )  ASA 2   MILD SYSTEMIC ILLNESS  ( x )  ASA 3   SYSTEMIC DISEASE, NOT INCAPACITATING  (  )  ASA 4   SEVERE SYSTEMIC DISEASE, DISEASE IS CONSTANT THREAT TO LIFE  (  )  ASA 5   MORIBUND CONDITION, NOT EXPECTED TO LIVE >24 HOURS            IRRESPECTIVE OF PROCEDURE  (  )  E           EMERGENCY PROCEDURE       PLANNED SEDATION:   ( x ) NO SEDATION  (  ) MODERATE SEDATION  (  ) DEEP SEDATION WITH ANESTHESIA      CONCLUSION:   PATIENT HAS BEEN REASSESSED IMMEDIATELY PRIOR TO THE PROCEDURE  AND IS AN APPROPRIATE CANDIDATE FOR THE PLANNED SEDATION AND   PROCEDURE.  RISKS, BENEFITS AND ALTERNATIVES TO THE PLANNED   PROCEDURE AND SEDATION HAVE BEEN EXPLAINED TO THE PATIENT   OR GUARDIAN.    ( x )  YES  (  )  EMERGENCY CONSENT       Signed by: Tamala Bari, MD  [facility]-Section of Vascular & Interventional Radiology  Contact Numbers:  Regular business hours (8A-5P M-F):  [facility]:  [phone] (option 3-outpatient scheduling, option 4-consults, option 5-inpatient procedures)  [facility]:  [phone]  [facility]: [phone]  After hours/Answering service:  [phone]

## 2021-12-28 NOTE — UM Notes (Signed)
[facility]       [facility]   [facility] H[facility] PROGRESS NOTE     Today's date & time: 12/28/21 9:13 AM  Patient Name: [name] BirksSLADE,[name]  Attending Physician: Viviann SpareAshiny, Zelalem A, MD  Admission Date: 12/25/2021         63F here for trialysis placement for plasma exchange for GBS.    Hospital course:   Following admission, patient was seen by neurology consult who performed MRI of the entire spine and brain, without contrast which came back to show no major issues except on the lumbar area L4-L5 disc disease with facet arthrosis and degenerative changes, neurology team recommended LP and CSF studies so far are unrevealing, patient continued to complain of having bandlike pain around the epigastric area & in the back, there was tenderness on the right side of the thoracic vertebral area in the muscle, Lidoderm patch applied, patient is getting Protonix and Maalox, has not had a bowel movement for that reason given lactulose and stool softeners.  Patient was sent from enema multiple hospital for consideration of IVIG with a suspicion of GBS, EMG currently pending.         98.4, 102, 16, 152/77         Assessment:          Active Hospital Problems     Diagnosis    Hypokalemia    Thoracic back pain    Gastroparesis    Weakness of both legs    Guillain Barr syndrome    Type 2 diabetes mellitus    Paroxysmal atrial fibrillation      Plan:   Difficulty walking and bilateral leg and hand weakness - patient to has multiple vague complaints who was transferred from outside hospital for consideration of EMG for Katheran AweGilliam Barr consideration, and possible IVIG, currently seen by neurology team here and based on her history started the patient on thiamine IV, at this time we will continue to perform EMG, possible IVIG, LP was done and came back unremarkable, got seen by PT and OT team who recommended going to acute rehab, fall precaution, continue monitoring.  Patient had NIF and VC done on 3/5 which showed low volume.  Patient  continued proximal muscle weakness, areflexia, highly suspicious for GBS and neurology team started the patient on IVIG on 12/27/2021, after the start of IVIG within the first 10 minutes patient developed numbness on the upper lip on the right side and tongue numbness, was that it was discontinued and was placed as allergy, following that IgG antibodies were ordered, IVIG antibodies ordered, stroke team recommended placing dialysis catheter and doing plasma exchange, pathology team was contacted, patient says every day she is feeling better, currently awaiting the start of plasma exchange.  Patient also had a EMG done on 12/27/2021, based on that CT scan of the abdomen and pelvis was done which showed no hematoma. Appreciate input from neurology team.   Upper abdominal and mid back pain - patient complaining of having some pain in the epigastric area banding leg to the mid back, she is placed on PPI, Tums, muscle relaxants, as needed pain medications, may consider placing the patient on scheduled Tylenol, will continue monitoring.  Pain is getting better by the day .   Paroxysmal atrial fibrillation - rate is controlled, patient is on apixaban.  Metoprolol.   Hypertension - blood pressure is controlled, continue monitoring.   COPD - noted, patient is getting Symbicort, in no acute respiratory distress, on oxygen.  Type 2 diabetes mellitus - borderline hemoglobin A1c in the past 2020 at 6.0, currently no treatment, will check hemoglobin A1c tomorrow morning.   Hypokalemia - replaced, monitor closely, magnesium level is also lower, replaced magnesium, now  started BID potassium ordered.   Anemia - most likely poor nutrition and chronic disease, will check iron profile, consider iron infusion.  Multivitamins added.   Bipolar disorder - patient seem to have some issues with behavior, patient is on nortriptyline and Latuda, no acute exacerbation, continue monitoring.   Constipation - patient said last bowel meant a few days  ago, stool softeners and laxatives given, had multiple bowel movements on the evening of 12/27/2021, continue monitoring     GI prophylaxis: Protonix, Maalox            Subjective      CC: Weakness of both legs     Interval History/24 hour events: Patient say every day she is getting better, no shortness of breath, no chest pain, back pain seem to be getting better     HPI: HPI per Admitting Provider   " [name] is a 66 y.o. female hx afib on Eliquis, HTN, HLD, DM, COPD, TIAs, COPD, bipolar disorder, [facility] admit 2/7-2/17/23 for rhabdomyolysis (peak CK 4464 - statin d/c'ed) + vomiting ?gastroparesis + starvation ketoacidosis + UTI, [facility] admit 2/18-2/20/23 for rhabdomyolysis (peak CK 2509) + transaminitis + bilateral leg weakness + right paresthesias, [facility] ER visit 3/2 for bilateral leg weakness who presents to [facility] with difficulty standing and walking, bilateral leg/hand weakness, bilateral leg/hand/face paresthesias. She noted bilateral leg weakness with her prior [facility] admission 2/7-2/17 and was readmitted for bilateral leg weakness 2/18-2/20. The past 4 days she has had difficulty standing and walking. The past 4 days she notes new bilateral hand and face paresthesias and bilateral hand weakness. She has some shortness of breath but is speaking in full sentences. She went to [facility] ER 3/2 and was discharged. She saw her PMD who advised she go to an [facility]. At [facility] she got MRI brain and CTL-spine which did not show a cause for her symptoms. [facility] called neurology Dr. Francesco Sor who said to consider IVIG for 5 days, LP, EMG. The LP was done at [facility]. Dr. Francesco Sor advised transfer to [facility]. These symptoms are sudden onset, moderate intensity, without alleviating factors. "     Neurology Note:    Pt had an anaphylactic reaction to IVIG yesterday, tongue and lip swelling treated w benad., subsided  IgA normal , awaiting anti IgA Ab      [place]endrick [place] RN,BSN   PRN Utilization Review   [facility]    [address]   [address]   [place], [place] [address]   [place]: [phone]    Fax: [phone]

## 2021-12-28 NOTE — Plan of Care (Signed)
Problem: Moderate/High Fall Risk Score >5  Goal: Patient will remain free of falls  Outcome: Progressing  Flowsheets (Taken 12/28/2021 0800 by Henrine Screws, RN)  High (Greater than 13):   HIGH-Bed alarm on at all times while patient in bed   HIGH-Apply yellow "Fall Risk" arm band   HIGH-Initiate use of floor mats as appropriate     Problem: Compromised Tissue integrity  Goal: Damaged tissue is healing and protected  Outcome: Progressing  Flowsheets (Taken 12/28/2021 1502 by Henrine Screws, RN)  Damaged tissue is healing and protected:   Monitor/assess Braden scale every shift   Provide wound care per wound care algorithm   Reposition patient every 2 hours and as needed unless able to reposition self   Increase activity as tolerated/progressive mobility   Avoid shearing injuries   Relieve pressure to bony prominences for patients at moderate and high risk   Use bath wipes, not soap and water, for daily bathing   Keep intact skin clean and dry   Use incontinence wipes for cleaning urine, stool and caustic drainage. Foley care as needed   Monitor external devices/tubes for correct placement to prevent pressure, friction and shearing   Utilize specialty bed  Goal: Nutritional status is improving  Outcome: Progressing  Flowsheets (Taken 12/27/2021 0137 by Durel Salts, RN)  Nutritional status is improving: Allow adequate time for meals     Problem: Bladder/Voiding  Goal: Remains continent  Outcome: Progressing  Goal: Perineal skin integrity is maintained or improved  Outcome: Progressing  Flowsheets (Taken 12/27/2021 1334 by Henrine Screws, RN)  Perineal skin integrity is maintained or improved:   Keep intact skin clean and dry   Use protective skin barriers to decrease potential skin breakdown   Consult/collaborate with Wound Care Nurse  Goal: Free from infection  Outcome: Progressing  Goal: Patient will experience proper bladder emptying during admission  Outcome: Progressing  Flowsheets (Taken 12/28/2021 2051)  Patient  will experience proper bladder emptying during admission:   Monitor intake and output   Encourage patient to empty bladder at regular intervals     Problem: Neurological Deficit  Goal: Neurological status is stable or improving  Outcome: Progressing  Flowsheets (Taken 12/27/2021 1334 by Henrine Screws, RN)  Neurological status is stable or improving: Monitor/assess/document neurological assessment (Stroke: every 4 hours)     Problem: Peripheral Neurovascular Impairment  Goal: Extremity color, movement, sensation are maintained or improved  Outcome: Progressing  Flowsheets (Taken 12/28/2021 0421 by Roanna Epley, RN)  Extremity color, movement, sensation are maintained or improved:   Increase mobility as tolerated/progressive mobility   Assess and monitor application of corrective devices (cast, brace, splint), check skin integrity   Assess extremity for proper alignment     Problem: Compromised Hemodynamic Status  Goal: Vital signs and fluid balance maintained/improved  Outcome: Progressing  Flowsheets (Taken 12/28/2021 0421 by Roanna Epley, RN)  Vital signs and fluid balance are maintained/improved:   Position patient for maximum circulation/cardiac output   Monitor/assess vitals and hemodynamic parameters with position changes   Monitor and compare daily weight   Monitor intake and output. Notify LIP if urine output is less than 30 mL/hour.   Monitor/assess lab values and report abnormal values     Problem: Nutrition  Goal: Nutritional intake is adequate  Outcome: Progressing  Flowsheets (Taken 12/28/2021 0421 by Roanna Epley, RN)  Nutritional intake is adequate:   Monitor daily weights   Assist patient with meals/food selection   Allow adequate time for meals  Problem: Pain interferes with ability to perform ADL  Goal: Pain at adequate level as identified by patient  Outcome: Progressing  Flowsheets (Taken 12/28/2021 1502 by Henrine Screwsodriguez, Ana, RN)  Pain at adequate level as identified by patient:   Identify patient  comfort function goal   Assess for risk of opioid induced respiratory depression, including snoring/sleep apnea. Alert healthcare team of risk factors identified.   Assess pain on admission, during daily assessment and/or before any "as needed" intervention(s)   Reassess pain within 30-60 minutes of any procedure/intervention, per Pain Assessment, Intervention, Reassessment (AIR) Cycle   Evaluate if patient comfort function goal is met   Evaluate patient's satisfaction with pain management progress   Offer non-pharmacological pain management interventions     Problem: Side Effects from Pain Analgesia  Goal: Patient will experience minimal side effects of analgesic therapy  Outcome: Progressing  Flowsheets (Taken 12/28/2021 1502 by Henrine Screwsodriguez, Ana, RN)  Patient will experience minimal side effects of analgesic therapy: Monitor/assess patient's respiratory status (RR depth, effort, breath sounds)

## 2021-12-28 NOTE — OT Progress Note (Signed)
Occupational Therapy Treatment  [name]        Post Acute Care Therapy Recommendations:     Discharge Recommendations:  Acute Rehab    If Acute Rehab  recommended discharge disposition is not available, patient will need max assist for ADLs/transfers and HHOT.     DME needs IF patient is discharging home: [facility], Hospital bed, [facility]    Therapy discharge recommendations may change with patient status.  Please refer to most recent note for up-to-date recommendations.    Patient anticipated to benefit from and to be able to engage in 3 hours of therapy a day for 5 days a week.     Assessment:   Significant Findings: None    Pt received in the bed, agreeable to OT treatment session. Pt making good progress towards goals. Pt demonstrating improved sit balance this date, able to participate in dynamic ball toss/catch activity for higher level balance challenge with fair+ balance throughout. Pt mostly challenged by ongoing B hand numbness making it difficult to catch ball consistently. Pt able to complete sit to stand transfer with use of railing on back of chair with good return, demonstrates good ability to push through UE to achieve upright stand, though still requiring min-mod physical assist to achieve full stand. Pt assisted with peri care from standing level though requires seated rest break after ~20-30 sec. Pt left in the bed with all needs in reach, all questions answered. Will continue to benefit from skilled OT to maximize functional independence and address deficits.      Treatment Activities: bed mobility, sit balance, transfer training, ADL, therex, education    Educated the patient to role of occupational therapy, plan of care, goals of therapy and HEP, safety with mobility and ADLs, home safety.    Plan:    OT Frequency Recommended: 4-5x/wk     Continue plan of care.    Unit: [facility] TOWER 7  Bed: F745/F745.01       Precautions and Contraindications:   Falls    Updated  Medical Status/Imaging/Labs:  Reviewed     Subjective: "I will give it a try"    Patient's medical condition is appropriate for Occupational Therapy intervention at this time.  Patient is agreeable to participation in the therapy session. Nursing clears patient for therapy.    Pain:   Scale: 6/10  Location: neck at catheter site   Intervention: increased mobility, repositioning for comfort     Objective:   Patient is in bed with telemetry in place.  Pt wore mask during therapy session:No      Cognition  A/Ox3  Answers questions appropriately: yes  Command Following: good     Functional Mobility  Rolling: SBA  Supine to Sit: minA, uses arms to assist with managing LEs  Sit to Supine: modA for LE management   Sit to Stand: modA with use of railing on back of chair     PMP Activity: Step 4 - Dangle at Bedside    Balance  Static Sitting: good-  Dynamic Sitting: fair+ during ball toss   Static Standing: fair with BUE support on railing on back of chair     Self Care and Home Management  Eating: independent  Grooming: setup at EOB   Bathing: modA from seated   UE Dressing: SBA  LE Dressing: minA donning socks seated at EOB   Toileting: maxA    Therapeutic Exercises/Activities  Seated ball toss     Participation: good  Endurance: good    Patient left with call bell within reach, all needs met, SCDs off, fall mat in place, bed alarm on, chair alarm n/a and all questions answered. RN notified of session outcome and patient response.     Goals:  Time For Goal Achievement: 7 visits  ADL Goals  Patient will groom self: Minimal Assist, at sinkside, 7 visits  Patient will dress lower body: Minimal Assist, with AE, 7 visits  Mobility and Transfer Goals  Pt will perform functional transfers: Minimal Assist, with rolling walker, 7 visits  Neuro Re-Ed Goals  Pt will perform dynamic sitting balance: Supervision, to increase ability to complete ADLs, 7 visits  Other Goal: pt will demonstrate use of visual compensatory methods 2/2  decreased LE sensation           Vision Goals  Pt with diplopia will complete ADLs with adaptive equipment: modified independent, to locate items within environment for ADLs            PPE worn during session: procedural mask and gloves    Tech present: n/a  PPE worn by tech: N/A    Enid Derry, OTR/L  Pager [phone]      Time of Treatment  OT Received On: 12/28/21  Start Time: 1130  Stop Time: 1205  Time Calculation (min): 35 min    Treatment # 2 of 7 visits

## 2021-12-28 NOTE — Plan of Care (Signed)
In lieu of EMG findings we will investigate RPB and plexus etiology    Pt had an anaphylactic reaction to IVIG yesterday, tongue and lip swelling treated w benad., subsided  IgA normal , awaiting anti IgA Ab  TSH a1c normal   HIV neg    Zinc mild low   On thiamine      Central line place for possible plex to treat poss auto/imm mediated myopathy v ddx: endocrine inflamm infectious, drugs toxins, metabolic  DM PM [employer] poss assoc w occult ca     Pt complains of dryness to eyes mouth, poss assoc w [employer] [place] dis. Sys Scl.     Holding off on bx for now       Checking Ck for trend today  Checking vit d     B1 pending    Im sending a host of autoimm paraneo ganglion tests  Enceph autoimm  Quest myositis [id number]  Motor sensory  MG      B6 penidng      CT abd pelvis      A. [name], MD  Neuro-hospitalist  [facility]

## 2021-12-28 NOTE — Sedation Documentation (Signed)
Pt had a right chest trialysis catheter placed by Dr Bonita Quin, pt tolerated the procedure well, VSS, no complaints of pain at this time, report phoned to RN, pt will transfer to CT scan then back to room with transporter.

## 2021-12-28 NOTE — PT Progress Note (Signed)
Physical Therapy Note    Physical Therapy Treatment  [name]      Post Acute Care Therapy Recommendations:     Discharge Recommendations:  Acute Rehab    If Acute Rehab  recommended discharge disposition is not available, patient will need mod assist for mobility and HHPT.     DME needs IF patient is discharging home: [facility], Hospital bed (possible slideboard)    Therapy discharge recommendations may change with patient status.  Please refer to most recent note for up-to-date recommendations.    Patient anticipated to benefit from and to be able to engage in 3 hours of therapy a day for 5 days a week.      Assessment:   Significant Findings: None    Pt with mild c/o pain at PLEX cath placement site requiring encouragement to participate in therapy session. Pt continues to require assist for B LEs OOB 2/2 proximal weakness and poor motor control. Pt with continued improvement in trunk control able to perform dynamic balance activities reaching across midline and outside BOS with SBA. Pt able to stand with min/modA utilizing back of bedside chair with continued noted hyperextension in stance. Pt continues to present with decreased strength, endurance, sensation, impaired motor control and balance deficits and would continue to benefit from skilled PT to maximize functional mobility and independence.     Assessment: Decreased LE strength, Decreased UE strength, Decreased safety/judgement during functional mobility, Decreased endurance/activity tolerance, Decreased sensation, Impaired coordination, Impaired motor control, Decreased functional mobility, Decreased balance, Gait impairment  Progress: Progressing toward goals  Prognosis: Good, With continued PT status post acute discharge  Risks/Benefits/POC Discussed with Pt/Family: With patient  Patient left without needs and call bell within reach. RN notified of session outcome.     Treatment Activities: Neuro re-ed and Therex    Educated the patient to  role of physical therapy, plan of care, goals of therapy and HEP, safety with mobility and ADLs, energy conservation techniques.    Plan:   Treatment/Interventions: Exercise, Neuromuscular re-education, LE strengthening/ROM, Endurance training, Cognitive reorientation, Equipment eval/education, Bed mobility, Compensatory technique education, Patient/family training        PT Frequency: 4-5x/wk     Continue plan of care.    Unit: [facility] TOWER 7  Bed: F745/F745.01     Precautions and Contraindications:   Precautions  Other Precautions: falls, decreased B LE strength/sensation    Updated Medical Status/Imaging/Labs:   CT Abdomen Pelvis WO IV/ WO PO Cont    Result Date: 12/28/2021  Distended urinary bladder. Otherwise unremarkable noncontrast abdomen pelvis CT. Rocky Crafts, MD 12/28/2021 11:26 AM    Triple Lumen Dialysis Cath    Result Date: 12/28/2021  Right IJ 13 French 20 cm trialysis catheter placement. Tip in the deep right atrium (only place where adequate flow was obtained) . PLAN: Catheter ready for use. PROCEDURE SUMMARY: *Venous access in ultrasound guidance. *Nontunneled dialysis catheter placement. TECHNIQUE/FINDINGS: Informed consent for the procedure including risks, benefits and alternatives was obtained. A safety timeout was performed and documented with all members of the procedure team present, including verification of patient identification, correct procedure, procedure site, and laterality. The patient was positioned supine. Preparation: (MIPS): The site was prepared and draped using all elements of maximal sterile barrier technique including sterile gloves, sterile gown, cap, mask, large sterile sheet, sterile ultrasound probe cover, hand hygiene and cutaneous antisepsis with 2% chlorhexidine. Medical reason for site preparation exception (MIPS): Not applicable. Sedation: The patient was placed under  no additional IV sedation which was administered/monitored by not applicable.  Sonographic interrogation of the right internal jugular vein demonstrated patency. Local anesthesia was administered at the access site. A small dermatotomy an #11 scalpel was made and venous access was achieved with a 21 gauge single wall needle. Sonographic images were obtained pre- and post- puncture for documentation.  A 0.018 inch guidewire was advanced into the inferior vena cava. The needle was exchanged for a 5 French coaxial dilator system. Using fluoroscopic guidance, the 0.018 inch guidewire was removed and marked at the appropriate length for the intravascular portion of the catheter and a 0.035 inch wire was then advanced into the inferior vena cava. Sequential dilation was performed over the wire and the tiialysis catheter was advanced. The wire was removed. The catheter were evaluated for appropriate flow and were flushed with normal saline. The catheter was secured to the skin with nonabsorbable suture. A sterile dressing was applied. The procedure was well tolerated.  A fluoroscopic image was obtained to document catheter tip position at the right atrium. All indicated images were saved to PACS. CONTRAST: Contrast agent: None Contrast volume (mL): 0 RADIATION PARAMETERS: Fluoroscopy time (minutes): 0.6  Reference air kerma (mGy): 3.6 Darci Current, MD 12/28/2021 12:42 PM    Subjective:   Patient Goal: not stated    "We can try I guess."  Pain Assessment  Pain Assessment: Wong-Baker FACES  Wong-Baker FACES Pain Rating: Hurts little more  Pain Location: Neck  Pain Intervention(s): Medication (See eMAR);Repositioned;Ambulation/increased activity;Distraction;Elevated;Emotional support    Patient's medical condition is appropriate for Physical Therapy intervention at this time.  Patient is agreeable to participation in the therapy session. Nursing clears patient for therapy.    Objective:   Observation of Patient/Vital Signs:  Patient is in bed with telemetry and female external catheter in place.  Pt wore mask  during therapy session:No      Cognition/Neuro Status  Arousal/Alertness: Appropriate responses to stimuli  Attention Span: Appears intact  Orientation Level: Oriented X4  Memory: Appears intact  Following Commands: Follows all commands and directions without difficulty  Safety Awareness: minimal verbal instruction  Insights: Decreased awareness of deficits;Educated in safety awareness  Problem Solving: Assistance required to identify errors made;Assistance required to generate solutions;Assistance required to implement solutions;minimal assistance  Behavior: calm;cooperative  Motor Planning: intact       Functional Mobility:  Supine to Sit: Contact Guard Assist;Minimal Assist  Scooting to EOB: Contact Guard Assist  Sit to Supine: Minimal Assist (increased assist for B LEs)  Sit to Stand: Minimal Assist;Increased Time;Increased Effort  Stand to Sit: Minimal Assist;Moderate Assist  Transfers  Bed to Chair: Unable to assess (Comment)  PMP - Progressive Mobility Protocol   PMP Activity: Step 4 - Dangle at Bedside     Ambulation:  Ambulation: Unable to assess (Comment)       Neuro Re-Ed  Sitting Balance: sitting reaching activities;stand by assist       Patient Participation: good  Patient Endurance: fair     Patient left with call bell within reach, all needs met and all questions answered. RN notified of session outcome and patient response.     SCDs: in place  Fall mat: in place  Bed alarm: on  Chair alarm: n/a    Goals:  Goals  Goal Formulation: With patient  Time for Goal Acheivement: 10 visits  Goals: Select goal  Pt Will Roll Left: independent  Pt Will Roll Right: independent  Pt Will Go Supine To  Sit: with stand by assist  Pt Will Perform Sit To Supine: with minimal assist (for LEs)  Pt Will Sit Edge of Bed: 11-15 min, modified independent  Pt Will Perform Sit to Stand: with minimal assist  Pt Will Transfer Bed/Chair: with rolling walker, with moderate assist  Pt Will Ambulate: 1-10 feet, with rolling walker,  with moderate assist  Pt Will Propel Wheelchair: 51-150 feet, with stand by assist  Pt Will Demo / Request Pressure Relief: modified independent    Time of Treatment  PT Received On: 12/28/21  Start Time: 1150  Stop Time: 1225  Time Calculation (min): 35 min  Treatment # 2 out of 10 visits    PPE worn during session: procedural mask and gloves    Tech present: n/a - Session overlap with OT for safety and to maximize therapeutic benefit.  PPE worn by tech: N/A    Clent Jacks PT, DPT  Pager [phone]

## 2021-12-28 NOTE — Progress Notes (Signed)
[facility] Consultation Progress Note    Date Time: 12/28/21 9:03 AM  Patient Name: [facility]  Outpatient Neurologist :    CC: LE weakness, paresthesia         Assessment:   66 y.o. 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   - CSF slightly elevated protein (52), negative ME panel, negative HSV   - Anaphylactic response to IVIg (IgA 435)   - 3/6 EMG showing a myopathic process affecting proximal bilateral lower limb muscles   - CT chest/abd/pelvis unremarkable    Patient Active Problem List   Diagnosis    CVA (cerebral vascular accident)    Chest pain    Chest pain with high risk of acute coronary syndrome    Paroxysmal atrial fibrillation    Hypertension    Hyperlipidemia    Seizure disorder    History of gastrointestinal hemorrhage    Depression    Anemia    Asthma    Non-traumatic subcutaneous emphysema    Chest pain with moderate risk for cardiac etiology    Left-sided weakness    History of asthma    History of transient ischemic attack (TIA)    Type 2 diabetes mellitus    Syncope and collapse    Weakness of both legs    Hypokalemia    Thoracic back pain    Gastroparesis    Guillain Barr syndrome       Plan:   - EMG done, report reviewed  - Recommend CT chest/Abd/pelvis - done, report reviewed  - Recommend MRI femur bilaterally - ordered  - Recommend checking encephalopathy autoimmune eval, ganglioside ab panel, anti IgA ab test, myositis ab panel, MG panel, paraneoplastic panel - ordered  - Continue high dose Thiamine treatment  - Thiamine 500 mg IV q8h x 3 days followed by 250 mg IV daily x 3 days followed by 100 mg po daily.   - Awaiting remainder of labs: motor and sensory neuropathy eval, B6, B1, final CSF culture  - Medical management per primary team  -PT/OT      Neurology  Attending Addendum:    I have personally reviewed the interval history, images and pertinent test results and I have personally examined the patient and confirmed the major physical findings of this resident/NP/PA note.  Also, I have noted any changes since the note was written as well as added additional findings and recommendations.   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 [name], MD  Neuro-hospitalist  [facility]      Interval History/Subjective:   No new events overnight.   Pt states that she is feeling slightly better today. She notes that she feels stronger. She also states that the loss of sensation seems to be intermittent.     HbA1c 5.3%  HIV nonreactive  Vit B12 442  TSH 1.19  Syphilis nonreactive  CK 239  CRP 2.7  Zinc 48  Copper 131  ANA screen negative      Medications:     Current Facility-Administered Medications   Medication Dose Route Frequency    acetaminophen  650 mg Oral Daily    amLODIPine  2.5 mg Oral Daily    [Held by provider] apixaban  5 mg Oral Q12H SCH    bisacodyl  5 mg Oral Daily    budesonide-formoterol  2 puff Inhalation BID    diphenhydrAMINE  25 mg Oral Daily    immune globulin (human)  0.4 g/kg  (Ideal) Intravenous Q24H    lactulose  20 g Oral Q6H    lidocaine  1 patch Transdermal Q24H    lurasidone  120 mg Oral Daily    metoprolol succinate XL  25 mg Oral Daily    nortriptyline  10 mg Oral QHS    pantoprazole  40 mg Oral Daily    potassium chloride  40 mEq Oral BID    rOPINIRole  1 mg Oral QHS    terazosin  2 mg Oral QHS    thiamine (VITAMIN B-1) IVPB  250 mg Intravenous Q24H SCH    Followed by    Melene Muller ON 12/31/2021] thiamine  100 mg Oral Daily    vitamins/minerals  1 tablet Oral Daily       Review of Systems:   Neurological ROS: negative for - behavioral changes, bowel and bladder control changes, confusion, or dizziness    Physical Exam:   Temp:  [97.9 F (36.6 C)-98.5 F (36.9 C)] 98.2 F (36.8 C)  Heart Rate:  [100-115] 115  Resp Rate:  [15-20] 15  BP: (135-160)/(75-83) 150/83    Vital Signs:  Reviewed    General: Well developed and well nourished. No acute distress. Cooperative with the exam  ENT: Normal oral mucosa, no ear or nose discharge  Neck: Symmetric, no deformities  CV: RRR  Resp: No audible wheezing, normal work of breathing  Abd: Soft, nondistended  Skin: Intact, extremities normal in color  Psych: Affect is normal, good insight    Mental Status: The patient is awake, alert and oriented to person, place, and time.   Fund of knowledge appropriate  Recent and remote memory are intact   Attention span and concentration appear normal.  Speech is mildly dysarthric. There is no evidence of aphasia in conversational speech.    Cranial nerves:   -CN III, IV, VI: Pupils equal, round, and reactive to light; extraocular movements intact; no ptosis; no nystagmus  -CN V: Facial sensation intact in V1 through V3 distributions   -CN VII: left facial droop but is able to activate, no forehead involvement  -CN VIII: Hearing intact to conversational speech   -CN  IX, X: Normal phonation   -CN XI: UTA  -CN XII: Tongue protrudes midline    Motor: Muscle tone normal without spasticity or flaccidity. No  atrophy.       UEs:   Deltoid Bicep Tricep Grip   Right 5 5 5 5    Left 5 5 5 5      LEs   HF KF KE PF DF   Right 3 4 4 5 5    Left 3 4 4 5 5      Sensory:   Light touch decreased on the left.    Temperature and Vibration decreased below knees bilaterally    Reflexes:      B T BR P A   Right 0 0 0 0 0   Left 0 0 0 0 0     Plantars: equivocal    Coordination: FTN intact. No tremors    Gait: deferred      Labs:   Labs reviewed. Managed by primary team.     Results       Procedure Component Value Units Date/Time    IgA, Quantitative [[id number]] Collected: 12/28/21 0520    Specimen: Blood Updated: 12/28/21 0642     Immunoglobulin A 435 mg/dL     Basic Metabolic Panel [[id number]]  (Abnormal) Collected: 12/28/21 0520    Specimen: Blood Updated: 12/28/21 0557     Glucose 89 mg/dL      BUN 4.0 mg/dL      Creatinine 0.6 mg/dL      Calcium 9.4 mg/dL      Sodium 347 mEq/L      Potassium 3.7 mEq/L      Chloride 106 mEq/L      CO2 23 mEq/L      Anion Gap 11.0    GFR [[id number]] Collected: 12/28/21 0520     Updated: 12/28/21 0557     EGFR >60.0    CBC and differential [[id number]]  (Abnormal) Collected: 12/28/21 0520    Specimen: Blood Updated: 12/28/21 0552     WBC 5.88 x10 3/uL      Hgb 8.8 g/dL      Hematocrit 88.4 %      Platelets 403 x10 3/uL      RBC 3.40 x10 6/uL      MCV 81.2 fL      MCH 25.9 pg      MCHC 31.9 g/dL      RDW 16 %      MPV 8.6 fL      Instrument Absolute Neutrophil Count 3.08 x10 3/uL      Neutrophils 52.4 %      Lymphocytes Automated 33.3 %      Monocytes 12.1 %      Eosinophils Automated 1.2 %      Basophils Automated 0.3 %      Immature Granulocytes 0.7 %      Nucleated RBC 0.0 /100 WBC      Neutrophils Absolute 3.08 x10 3/uL      Lymphocytes Absolute Automated 1.96 x10 3/uL      Monocytes Absolute Automated 0.71 x10 3/uL      Eosinophils Absolute Automated 0.07 x10 3/uL      Basophils Absolute Automated 0.02 x10 3/uL      Immature Granulocytes Absolute 0.04 x10 3/uL      Absolute NRBC 0.00 x10 3/uL      Copper, serum [[id number]] Collected: 12/25/21 1350    Specimen: Blood Updated: 12/27/21 2334     Copper 131  mcg/dL     Zinc [[id number]][[id number]]  (Abnormal) Collected: 12/25/21 1350    Specimen: Blood Updated: 12/27/21 2334     Zinc 48 mcg/dL     ANA IFA w reflex to Titer/Pattern/Antibody [[id number]][[id number]] Collected: 12/25/21 1350     Updated: 12/27/21 1421     ANA Screen Negative    Hemoglobin A1C [[id number]][[id number]] Collected: 12/27/21 0738    Specimen: Blood Updated: 12/27/21 1021     Hemoglobin A1C 5.3 %      Average Estimated Glucose 105.4 mg/dL     Narrative:      This is NOT the correct Test for Patients with  Hemoglobinopathy.            Rads:   CT Head WO Contrast    Result Date: 12/24/2021   No acute intracranial process. Sandie AnoAakash Ahuja, MD  12/24/2021 8:17 PM    MRI Brain WO Contrast    Result Date: 12/25/2021  No acute intracranial abnormality. Mild chronic microvascular ischemic changes. Cavum septum pellucidum and cavum vergae. Alric Setonharles C Hoo MD, MD  12/25/2021 2:47 AM    MRI Cervical Spine WO Contrast    Result Date: 12/25/2021  No cord compression or intrinsic cord signal abnormality. Multilevel degenerative changes as described above, with varying degrees of neural foraminal narrowing, and no central canal stenosis. Alric Setonharles C Hoo MD, MD  12/25/2021 2:53 AM    MRI Thoracic Spine WO Contrast    Result Date: 12/25/2021  No cord compression or intrinsic cord signal abnormality. Minimal degenerative changes as described above. Alric Setonharles C Hoo MD, MD  12/25/2021 2:56 AM    MRI Lumbar Spine WO Contrast    Result Date: 12/25/2021  1. 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 Orson GearJoanna R Krause, MD  12/25/2021 2:54 AM    Chest AP Portable    Result Date: 12/24/2021   No acute pulmonary or pleural disease. Sandie AnoAakash Ahuja, MD  12/24/2021 7:14 PM       Signed by:  Barnie MortErin Young, PA-C  Physician Assistant  [facility]  Daytime: Spectralink [id number] or EpicChat  Consult requests: Spectralink [id number]  If urgent after 7:00  pm: [phone]    Please see attending Neurologist note that accompanies this mid-level encounter note.

## 2021-12-28 NOTE — Progress Notes (Signed)
[facility]   [facility]GRESS NOTE    Today's date & time: 12/28/21 9:13 AM  [name]  Attending Physician: Viviann SpareAshiny, Aahna Rossa A, MD  Admission Date: 12/25/2021    Assessment:     Active Hospital Problems    Diagnosis    Hypokalemia    Thoracic back pain    Gastroparesis    Weakness of both legs    Guillain Barr syndrome    Type 2 diabetes mellitus    Paroxysmal atrial fibrillation     Plan:   Difficulty walking and bilateral leg and hand weakness - patient to has multiple vague complaints who was transferred from outside hospital for consideration of EMG for Katheran AweGilliam Barr consideration, and possible IVIG, currently seen by neurology team here and based on her history started the patient on thiamine IV, at this time we will continue to perform EMG, possible IVIG, LP was done and came back unremarkable, got seen by PT and OT team who recommended going to acute rehab, fall precaution, continue monitoring.  Patient had NIF and VC done on 3/5 which showed low volume.  Patient continued proximal muscle weakness, areflexia, highly suspicious for GBS and neurology team started the patient on IVIG on 12/27/2021, after the start of IVIG within the first 10 minutes patient developed numbness on the upper lip on the right side and tongue numbness, was that it was discontinued and was placed as allergy, following that IgG antibodies were ordered, IVIG antibodies ordered, stroke team recommended placing dialysis catheter and doing plasma exchange, pathology team was contacted, patient says every day she is feeling better, currently awaiting the start of plasma exchange.  Patient also had a EMG done on 12/27/2021, based on that CT scan of the abdomen and pelvis was done which showed no hematoma. Appreciate input from neurology team.   Upper abdominal and mid back pain - patient complaining of having some pain in the epigastric area banding leg to the mid back, she is placed on PPI, Tums, muscle  relaxants, as needed pain medications, may consider placing the patient on scheduled Tylenol, will continue monitoring.  Pain is getting better by the day .   Paroxysmal atrial fibrillation - rate is controlled, patient is on apixaban.  Metoprolol.   Hypertension - blood pressure is controlled, continue monitoring.   COPD - noted, patient is getting Symbicort, in no acute respiratory distress, on oxygen.   Type 2 diabetes mellitus - borderline hemoglobin A1c in the past 2020 at 6.0, currently no treatment, will check hemoglobin A1c tomorrow morning.   Hypokalemia - replaced, monitor closely, magnesium level is also lower, replaced magnesium, now  started BID potassium ordered.   Anemia - most likely poor nutrition and chronic disease, will check iron profile, consider iron infusion.  Multivitamins added.   Bipolar disorder - patient seem to have some issues with behavior, patient is on nortriptyline and Latuda, no acute exacerbation, continue monitoring.   Constipation - patient said last bowel meant a few days ago, stool softeners and laxatives given, had multiple bowel movements on the evening of 12/27/2021, continue monitoring    GI prophylaxis: Protonix, Maalox    Spoke to patient's daughter over the phone and updated her about patient's condition    Subjective     CC: Weakness of both legs    Interval History/24 hour events: Patient say every day she is getting better, no shortness of breath, no chest pain, back pain seem to be getting better  HPI: HPI per Admitting Provider   " Donald Jacque is a 66 y.o. female hx afib on Eliquis, HTN, HLD, DM, COPD, TIAs, COPD, bipolar disorder, [facility] admit 2/7-2/17/23 for rhabdomyolysis (peak CK 4464 - statin d/c'ed) + vomiting ?gastroparesis + starvation ketoacidosis + UTI, [facility] admit 2/18-2/20/23 for rhabdomyolysis (peak CK 2509) + transaminitis + bilateral leg weakness + right paresthesias, [facility] ER visit 3/2 for bilateral leg weakness who presents to [facility] with  difficulty standing and walking, bilateral leg/hand weakness, bilateral leg/hand/face paresthesias. She noted bilateral leg weakness with her prior [facility] admission 2/7-2/17 and was readmitted for bilateral leg weakness 2/18-2/20. The past 4 days she has had difficulty standing and walking. The past 4 days she notes new bilateral hand and face paresthesias and bilateral hand weakness. She has some shortness of breath but is speaking in full sentences. She went to [facility] ER 3/2 and was discharged. She saw her PMD who advised she go to an [facility]. At [facility] she got MRI brain and CTL-spine which did not show a cause for her symptoms. [facility] called neurology Dr. Francesco Sor who said to consider IVIG for 5 days, LP, EMG. The LP was done at [facility]. Dr. Francesco Sor advised transfer to [facility]. These symptoms are sudden onset, moderate intensity, without alleviating factors. "     Hospital course:   Following admission, patient was seen by neurology consult who performed MRI of the entire spine and brain, without contrast which came back to show no major issues except on the lumbar area L4-L5 disc disease with facet arthrosis and degenerative changes, neurology team recommended LP and CSF studies so far are unrevealing, patient continued to complain of having bandlike pain around the epigastric area & in the back, there was tenderness on the right side of the thoracic vertebral area in the muscle, Lidoderm patch applied, patient is getting Protonix and Maalox, has not had a bowel movement for that reason given lactulose and stool softeners.  Patient was [facility] for consideration of IVIG with a suspicion of GBS, EMG currently pending.     Due to her history of not eating well and has been losing weight over the past few weeks, neurology team felt may be nutritional and started on on IV thiamine, patient has been requesting narcotics for the pain that she is got but not really having any tenderness on  examination.  Patient did NIF and vital capacity on the morning of 12/26/2021 which came back -12 cm H2O and vital capacity 1.5 L.  Patient is not in any respiratory distress, not needing any oxygen at this time.  Patient had additional laboratory studies including HIV and syphilis which came back negative, negative for COVID 19, potassium was noted to be low for which replacement was given, CPK slightly elevated at 239.  Cultures remain negative.     Patient was seen by PT and OT team who recommended going to acute rehab.  Patient was having constipation, stool softeners and laxatives given.     Review of Systems:   General - back pain   Resp - no SOB   CVS - no CP, no CAD   GI - no nausea, no diarrhea   CNS - no visual deficit, generalized  weakness    Physical Exam:     Vitals:    12/28/21 0409 12/28/21 0753 12/28/21 0903 12/28/21 0905   BP: 146/83 150/83 148/81 148/81   Pulse: (!) 115 (!) 115  (!) 118   Resp:  18 15     Temp: 98.4 F (36.9 C) 98.2 F (36.8 C)     TempSrc: Oral Oral     SpO2: 98% 98%     Weight:       Height:              Intake/Output Summary (Last 24 hours) at 12/28/2021 0913  Last data filed at 12/27/2021 2207  Gross per 24 hour   Intake 100 ml   Output --   Net 100 ml         General: Awake, alert, oriented x 3; no acute distress.  HEENT: PERRLA, eomi, sclera anicteric, oropharynx clear without lesions, mucous membranes moist  Neck: supple, no lymphadenopathy, no thyromegaly, no JVD, no carotid bruits  Cardiovascular: regular rate and rhythm, no murmurs, rubs or gallops  Lungs: clear to auscultation bilaterally, without wheezing, rhonchi, or rales  Abdomen: soft, non-tender, non-distended; no palpable masses, no hepatosplenomegaly, normoactive bowel sounds, no rebound or guarding  Extremities: no clubbing, cyanosis, or edema  Neuro: cranial nerves grossly intact, strength 5/5 in upper extremities except 3+/5 hand intrinsics, bilateral hip extension 2/5, bilateral knee extension 3/5, bilateral  dorsiflexion 3/5, bilateral plantarflexion 4/5, paresthesias bilateral feet/hands/face but intact to light touch, could not elicit knee DTRs, follows commands.   Skin: no rashes or lesions noted  GU: no CVA tenderness     Meds:   Medications were reviewed by me:  Current Facility-Administered Medications   Medication Dose Route Frequency    acetaminophen  650 mg Oral Daily    amLODIPine  2.5 mg Oral Daily    [Held by provider] apixaban  5 mg Oral Q12H SCH    bisacodyl  5 mg Oral Daily    budesonide-formoterol  2 puff Inhalation BID    diphenhydrAMINE  25 mg Oral Daily    immune globulin (human)  0.4 g/kg (Ideal) Intravenous Q24H    lactulose  20 g Oral Q6H    lidocaine  1 patch Transdermal Q24H    lurasidone  120 mg Oral Daily    metoprolol succinate XL  25 mg Oral Daily    nortriptyline  10 mg Oral QHS    pantoprazole  40 mg Oral Daily    potassium chloride  40 mEq Oral BID    rOPINIRole  1 mg Oral QHS    terazosin  2 mg Oral QHS    thiamine (VITAMIN B-1) IVPB  250 mg Intravenous Q24H SCH    Followed by    Melene Muller ON 12/31/2021] thiamine  100 mg Oral Daily    vitamins/minerals  1 tablet Oral Daily       Labs:   Labs reviewed personally include:  Recent Labs   Lab 12/28/21  0520 12/27/21  0738   WBC 5.88 5.06   Hgb 8.8* 7.5*   Hematocrit 27.6* 23.7*   Platelets 403* 327      Recent Labs   Lab 12/26/21  0536   PT 13.1*   PT INR 1.1   PTT 27         Recent Labs   Lab 12/28/21  0520 12/27/21  0738   Sodium 140 137   Potassium 3.7 3.2*   Chloride 106 103   CO2 23 22   BUN 4.0* 5.0*   Creatinine 0.6 0.6   EGFR >60.0 >60.0   Glucose 89 81   Calcium 9.4 9.0      Recent Labs   Lab 12/24/21  1838   Alkaline  Phosphatase 68   Bilirubin, Total 0.4   Protein, Total 7.1   Albumin 3.6   ALT 67*   AST (SGOT) 35              Microbiology Results (last 15 days)       Procedure Component Value Units Date/Time    Culture + Gram Azzie Glatter, CSF [[id number]] Collected: 12/24/21 2232    Order Status: Completed Specimen: Cerebrospinal  Fluid from CSF (Lumbar Puncture Spinal Fluid) Updated: 12/27/21 2056    Narrative:      ORDER#: [id number]                                    ORDERED BY: PATITUCCI, LAUR  SOURCE: CSF (Lumbar Puncture Spinal Fluid) lumbar spiCOLLECTED:  12/24/21 22:32  ANTIBIOTICS AT COLL.:                                RECEIVED :  12/27/21 18:51  Stain, Gram                                FINAL       12/27/21 20:56  12/27/21   No WBCs seen             No organisms seen             Stain performed on Cytospin (concentrated) specimen  Culture and Gram Stain, Aerobic, CSF       PENDING      CSF Meningitis/Encephalitis Pathogen Panel PCR [[id number]] Collected: 12/24/21 2232    Order Status: Completed Specimen: CSF (Lumbar Puncture Spinal Fluid) Updated: 12/25/21 1110     CSF Eschericia coli K1 by PCR Not Detected     CSF Haemophilus influenza by PCR Not Detected     CSF Listeria monocytogenes by PCR Not Detected     CSF Neisseria meningitidis (encapsulated) by PCR Not Detected     CSF Streptococcus agalactiae by PCR Not Detected     CSF Streptococcus pneumoniae by PCR Not Detected     CSF Cytomegalovirus by PCR Not Detected     CSF Enterovirus by PCR Not Detected     CSF Herpes simplex virus 1 by PCR Not Detected     CSF Herpes simplex virus 2 by PCR Not Detected     CSF Human herpesvirus 6 by PCR Not Detected     CSF Human parechovirus by PCR Not Detected     CSF Varicella zoster virus by PCR Not Detected     CSF Cryptococcus neoformans/gattii by PCR Not Detected     Comment: Test performed with the FilmArray Meningitis/Encephalitis (ME)  PCR Panel. A negative result does not rule out central nervous  system infection and results from this panel are not intended  to be used as the sole basis for diagnosis, treatment, or  other patient management decisions. Consider pathogen specific  testing if FilmArray ME is negative but clinical suspicion is  high for a particular infection. False negative results could  be due to the presence of  strains with genetic variability in  the target regions,concentration of pathogen nucleic acid below  the limit of detection, or improper specimen handling.  This test has not been specifically evaluated with CSF from  immunocompromised patients and this test may be affected  by  concurrent antimicrobial therapy. Positive results may be due  to detection of non-viable organism. Clinical correlation is  required. False positives may result from contamination during  specimen collection or processing. This test should not be  used for monitoring treatment of infection.         Narrative:      Indication for Meningitis/Encephalitis Panel PCR  Testing:->Very high clinical concern for infectious  encephalitis    COVID-19 (SARS-CoV-2) only (Liat Rapid) asymptomatic admission [[id number]] Collected: 12/24/21 2057    Order Status: Completed Specimen: Nasopharyngeal Updated: 12/24/21 2136     Purpose of COVID testing Screening     SARS-CoV-2 Specimen Source Nasal Swab     SARS CoV 2 Overall Result Not Detected     Comment: __________________________________________________  -A result of "Detected" indicates POSITIVE for the    presence of SARS CoV-2 RNA  -A result of "Not Detected" indicates NEGATIVE for the    presence of SARS CoV-2 RNA  __________________________________________________________  Test performed using the Roche cobas Liat SARS-CoV-2 assay. This assay is  only for use under the Food and Drug Administration's Emergency Use  Authorization. This is a real-time RT-PCR assay for the qualitative  detection of SARS-CoV-2 RNA. Viral nucleic acids may persist in vivo,  independent of viability. Detection of viral nucleic acid does not imply the  presence of infectious virus, or that virus nucleic acid is the cause of  clinical symptoms. Negative results do not preclude SARS-CoV-2 infection and  should not be used as the sole basis for diagnosis, treatment or other  patient management decisions. Negative results must be  combined with  clinical observations, patient history, and/or epidemiological information.  Invalid results may be due to inhibiting substances in the specimen and  recollection should occur. Please see Fact Sheets for patients and providers  located:  WirelessDSLBlog.no         Narrative:      o Collect and clearly label specimen type:  o PREFERRED-Upper respiratory specimen: One Nasal Swab in  Transport Media.  o Hand deliver to laboratory ASAP  Indication for testing->Extended care facility admission to  semi private room  Screening            Imaging personally reviewed, including:  Radiology Results (24 Hour)       ** No results found for the last 24 hours. **            Safety Checklist:     DVT prophylaxis:  CHEST guideline (See page [id number]) Chemical and Mechanical   Foley:  [place] R[facility] Foley protocol Not present   IVs:  Peripheral IV   PT/OT: Ordered   Daily CBC & or Chem ordered:  SHM/ABIM guidelines (see #5) Yes, due to clinical and lab instability   Reference for approximate charges of common labs: CBC auto diff - $76  BMP - $99  Mg - $79    Lines:          Patient Lines/Drains/Airways Status       Active PICC Line / CVC Line / PIV Line / Drain / Airway / Intraosseous Line / Epidural Line / ART Line / Line / Wound / Pressure Ulcer / NG/OG Tube       Name Placement date Placement time Site Days    Peripheral IV 12/25/21 20 G Standard Right Antecubital 12/25/21  0600  Antecubital  1  Disposition:   Today's date: 12/28/2021  Length of Stay: 3  Anticipated medical stability for discharge: March,  8 - Afternoon  Reason for ongoing hospitalization: Generalized weakness  Anticipated discharge needs: Acute rehab    Signed by: Viviann Spare, MD

## 2021-12-29 ENCOUNTER — Other Ambulatory Visit: Payer: Self-pay

## 2021-12-29 DIAGNOSIS — E1169 Type 2 diabetes mellitus with other specified complication: Secondary | ICD-10-CM

## 2021-12-29 DIAGNOSIS — M609 Myositis, unspecified: Secondary | ICD-10-CM

## 2021-12-29 LAB — VITAMIN B6: Vitamin B6: <2 ng/mL — ABNORMAL LOW (ref 2.1–21.7)

## 2021-12-29 MED ORDER — VITAMIN D 25 MCG (1000 UT) PO TABS
25.0000 ug | ORAL_TABLET | Freq: Every day | ORAL | Status: DC
Start: 2021-12-29 — End: 2022-01-03
  Administered 2021-12-29 – 2022-01-03 (×6): 25 ug via ORAL
  Filled 2021-12-29 (×5): qty 1

## 2021-12-29 MED ORDER — HYDROMORPHONE HCL 2 MG PO TABS
2.0000 mg | ORAL_TABLET | ORAL | Status: DC | PRN
Start: 2021-12-29 — End: 2022-01-03
  Administered 2021-12-29 – 2022-01-02 (×12): 2 mg via ORAL
  Filled 2021-12-29 (×13): qty 1

## 2021-12-29 NOTE — Progress Notes (Signed)
[facility] Consultation Progress Note    Date Time: 12/29/21 1:07 PM  Patient Name: [name]  Outpatient Neurologist :    CC: LE weakness, paresthesia         Assessment:   [name] is a 66 y.o. 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 Possible nutritional component   - CSF slightly elevated protein (52), negative ME panel, negative HSV   - Anaphylactic response to IVIg (IgA 435)   - 3/6 EMG showing a myopathic process affecting proximal bilateral lower limb muscles   - CT chest/abd/pelvis unremarkable   - CK 239, Vit D 10        Patient Active Problem List   Diagnosis    CVA (cerebral vascular accident)    Chest pain    Chest pain with high risk of acute coronary syndrome    Paroxysmal atrial fibrillation    Hypertension    Hyperlipidemia    Seizure disorder    History of gastrointestinal hemorrhage    Depression    Anemia    Asthma    Non-traumatic subcutaneous emphysema    Chest pain with moderate risk for cardiac etiology    Left-sided weakness    History of asthma    History of transient ischemic attack (TIA)    Type 2 diabetes mellitus    Syncope and collapse    Weakness of both legs    Hypokalemia    Thoracic back pain    Gastroparesis    Guillain Barr syndrome         Neurology Attending Addendum:    I have personally reviewed the interval history, images and pertinent test results and I have personally examined the patient and confirmed the major physical findings of this resident/NP/PA note.  Also, I have noted any changes since the note was written as well as added additional findings and recommendations.   I reviewed the brain imaging personally and I agree with the findings   In summary:     Today pts  proprioception is normal and sensory issues has almost recovered, pts arms are stronger and so are her legs  not following neurological expectations of recovery    Still c/o 'thick tongue and drooling'    Care everywhere search is perplexing on number of institutions following pt  Hx of crack cocaine addiction, 9 mo ago    Pt did have elevated CK and transaminitis pta at [facility]    W/u for muscle dis ongoing  Mri thighs , ck, vit b6, aldolase , LDH, all pending        A. [name], MD  Neuro-hospitalist  [facility]    Plan:   - Medical management per primary team  - MRI femur bilaterally ordered  - Follow-up pending labs including encephalopathy autoimmune eval, ganglioside ab panel, anti IgA ab test, myositis ab panel, MG panel, paraneoplastic panel, motor and sensory neuropathy eval, B6, B1, final CSF culture  - Continue high dose Thiamine treatment  - Thiamine 500 mg IV q8h x 3 days followed by 250 mg IV daily x 3 days followed by 100 mg po daily.   -PT/OT      Interval History/Subjective:   No acute events documented or reported overnight  Reports she is feeling better, "improved strength"  Sensation is improved    HbA1c 5.3%  HIV nonreactive  Vit B12 442  TSH 1.19  Syphilis nonreactive  CK 239  CRP 2.7  Zinc 48  Copper 131  ANA screen negative      Medications:     Current Facility-Administered Medications   Medication Dose Route Frequency    acetaminophen  650 mg Oral Daily    amLODIPine  2.5 mg Oral Daily    apixaban  5 mg Oral Q12H SCH    bisacodyl  5 mg Oral Daily    budesonide-formoterol  2 puff Inhalation BID    diphenhydrAMINE  25 mg Oral Daily    lidocaine  1 patch Transdermal Q24H    lurasidone  120 mg Oral Daily    metoprolol succinate XL  25 mg Oral Daily    nortriptyline  10 mg Oral QHS    pantoprazole  40 mg Oral Daily    potassium chloride  40 mEq Oral BID    rOPINIRole  1 mg Oral QHS    terazosin  2 mg Oral QHS    thiamine (VITAMIN B-1) IVPB  250 mg Intravenous Q24H SCH    Followed by    Melene Muller ON  12/31/2021] thiamine  100 mg Oral Daily    vitamin D (cholecalciferol)  25 mcg Oral Daily    vitamins/minerals  1 tablet Oral Daily       Review of Systems:   All other systems were reviewed and are otherwise negative except as referenced in the above interval history above.      Physical Exam:   Temp:  [97.9 F (36.6 C)-98.4 F (36.9 C)] 98.2 F (36.8 C)  Heart Rate:  [102-115] 110  Resp Rate:  [15-17] 15  BP: (123-152)/(77-83) 134/83    Vital Signs:  Reviewed    General: Well developed and well nourished. No acute distress. Cooperative with the exam  ENT: Normal oral mucosa, no ear or nose discharge  Neck: Symmetric, no deformities  CV: RRR  Resp: No audible wheezing, normal work of breathing  Abd: Soft, nondistended  Skin: Intact, extremities normal in color  Psych: Affect is normal, good insight    Mental Status: The patient is awake, alert and oriented to person, place, and time.   Attention span and concentration appear normal.  Speech normal, no evidence of aphasia     Cranial nerves:   -CN III, IV, VI: Pupils equal, round, and reactive to light; extraocular movements intact; no ptosis; no nystagmus  -CN V: Facial sensation intact in V1 through V3 distributions   -CN VII: Face symmetric  -CN VIII: Hearing intact to conversational speech   -CN IX, X: Normal phonation   -CN XI: UTA  -CN XII: Tongue protrudes midline    Motor: Muscle tone normal without spasticity or flaccidity. No atrophy.       UEs:   Deltoid Bicep Tricep Grip   Right 5 5 5 5    Left 5 5 5 5      LEs   HF KF KE PF DF   Right 5 5 5 5 5    Left 4 4 4 5 5      Sensory: Light touch decreased on the left.    Temperature, Vibration and proprioception intact    Reflexes: DTRs absent, plantars are flexor    Coordination: FTN intact. No tremors/abnormal movements    Gait: Deferred      Labs:   Labs  reviewed. Managed by primary team.     Results       Procedure Component Value Units Date/Time    IgA, Quantitative [[id number]] Collected: 12/28/21 1710      Updated: 12/28/21 2018     Immunoglobulin A 318 mg/dL     Narrative:      Enter miscellaneous lab test:->anti IgA antibody test    Ganglioside Ab Panel, S [[id number]] Collected: 12/28/21 1710     Updated: 12/28/21 1732    Encephalopathy Autoimmune Evaluation [[id number]] Collected: 12/28/21 1710     Updated: 12/28/21 1732    Myasthenia Gravis Panel w/Reflex to MuSK Ab [[id number]] Collected: 12/28/21 1710    Specimen: Blood Updated: 12/28/21 1732    Paraneoplastic Autoantibody Shanon Ace [[id number]] Collected: 12/28/21 1710     Updated: 12/28/21 1732    Vitamin D,25 OH, Total [[id number]]  (Abnormal) Collected: 12/28/21 0520    Specimen: Blood Updated: 12/28/21 1459     Vitamin D, 25 OH, Total 10 ng/mL             Rads:   CT Abdomen Pelvis WO IV/ WO PO Cont    Result Date: 12/28/2021  Distended urinary bladder. Otherwise unremarkable noncontrast abdomen pelvis CT. Rocky Crafts, MD 12/28/2021 11:26 AM    CT Head WO Contrast    Result Date: 12/24/2021   No acute intracranial process. Sandie Ano, MD  12/24/2021 8:17 PM    CT Chest WO Contrast    Result Date: 12/28/2021  No evidence of malignancy in the chest. Carlynn Spry, MD 12/28/2021 2:41 PM    MRI Brain WO Contrast    Result Date: 12/25/2021  No acute intracranial abnormality. Mild chronic microvascular ischemic changes. Cavum septum pellucidum and cavum vergae. Alric Seton MD, MD  12/25/2021 2:47 AM    MRI Cervical Spine WO Contrast    Result Date: 12/25/2021  No cord compression or intrinsic cord signal abnormality. Multilevel degenerative changes as described above, with varying degrees of neural foraminal narrowing, and no central canal stenosis. Alric Seton MD, MD  12/25/2021 2:53 AM    MRI Thoracic Spine WO Contrast    Result Date: 12/25/2021  No cord compression or intrinsic cord signal abnormality. Minimal degenerative changes as described above. Alric Seton MD, MD  12/25/2021 2:56 AM    MRI Lumbar Spine WO Contrast    Result Date: 12/25/2021  1. Multilevel degenerative disc  disease and facet arthrosis of the lumbar spine, worse at L4-L5. Orson Gear, MD  12/25/2021 2:54 AM    Chest AP Portable    Result Date: 12/24/2021   No acute pulmonary or pleural disease. Sandie Ano, MD  12/24/2021 7:14 PM    Triple Lumen Dialysis Cath    Result Date: 12/28/2021  Right IJ 13 French 20 cm trialysis catheter placement. Tip in the deep right atrium (only place where adequate flow was obtained) . PLAN: Catheter ready for use. PROCEDURE SUMMARY: *Venous access in ultrasound guidance. *Nontunneled dialysis catheter placement. TECHNIQUE/FINDINGS: Informed consent for the procedure including risks, benefits and alternatives was obtained. A safety timeout was performed and documented with all members of the procedure team present, including verification of patient identification, correct procedure, procedure site, and laterality. The patient was positioned supine. Preparation: (MIPS): The site was prepared and draped using all elements of maximal sterile barrier technique including sterile gloves, sterile gown, cap, mask, large sterile sheet, sterile ultrasound probe cover, hand hygiene and cutaneous antisepsis with 2% chlorhexidine. Medical reason for site  preparation exception (MIPS): Not applicable. Sedation: The patient was placed under no additional IV sedation which was administered/monitored by not applicable. Sonographic interrogation of the right internal jugular vein demonstrated patency. Local anesthesia was administered at the access site. A small dermatotomy an #11 scalpel was made and venous access was achieved with a 21 gauge single wall needle. Sonographic images were obtained pre- and post- puncture for documentation.  A 0.018 inch guidewire was advanced into the inferior vena cava. The needle was exchanged for a 5 French coaxial dilator system. Using fluoroscopic guidance, the 0.018 inch guidewire was removed and marked at the appropriate length for the intravascular portion of the catheter  and a 0.035 inch wire was then advanced into the inferior vena cava. Sequential dilation was performed over the wire and the tiialysis catheter was advanced. The wire was removed. The catheter were evaluated for appropriate flow and were flushed with normal saline. The catheter was secured to the skin with nonabsorbable suture. A sterile dressing was applied. The procedure was well tolerated.  A fluoroscopic image was obtained to document catheter tip position at the right atrium. All indicated images were saved to PACS. CONTRAST: Contrast agent: None Contrast volume (mL): 0 RADIATION PARAMETERS: Fluoroscopy time (minutes): 0.6  Reference air kerma (mGy): 3.6 Darci CurrentEric You, MD 12/28/2021 12:42 PM       Signed by:  Katheren ShamsBen Wyllie, FNP-C  Nurse Practitioner  [place] [facility]  Daytime: [id number]  Consult requests: [phone]  After 7:00 pm: [phone]         *This note was generated by the Epic EMR system and Dragon speech recognition and may contain errors or omissions not intended by the user. Grammatical errors, random word insertions, deletions, incomplete sentences among other errors are occasional consequences of this technology.  If there are questions or concerns about the content of this note or information contained within the body of this dictation they should be addressed directly with the author for clarification.

## 2021-12-29 NOTE — PT Progress Note (Signed)
Physical Therapy Note    Physical Therapy Treatment  [name]      Post Acute Care Therapy Recommendations:     Discharge Recommendations:  Acute Rehab    If Acute Rehab  recommended discharge disposition is not available, patient will need Min/Mod assist for mobility and HHPT.     DME needs IF patient is discharging home: Wheelchair-manual, Front wheel walker    Therapy discharge recommendations may change with patient status.  Please refer to most recent note for up-to-date recommendations.    Patient anticipated to benefit from and to be able to engage in 3 hours of therapy a day for 5 days a week.      Assessment:   Significant Findings: None    Pt with improvement in  B LE strength and sensation this date however with continued proximal weakness. Pt able to stand with Min/ModA at [facility] with VCs to utilize vision for B foot placement prior to stance for safety 2/2 decreased proprioception. Pt able to take few side steps along EOB with RW and ModA with ataxic gait pattern and occasional scissoring. Pt continues to present with decreased B LE strength, sensation, decreased endurance and balance deficits and would continue to benefit from skilled PT to maximize functional mobility and independence.     Assessment: Decreased UE strength, Decreased LE strength, Decreased safety/judgement during functional mobility, Decreased endurance/activity tolerance, Decreased functional mobility, Decreased balance, Gait impairment, Impaired coordination, Impaired motor control, Decreased sensation  Progress: Progressing toward goals  Prognosis: Good, With continued PT status post acute discharge  Risks/Benefits/POC Discussed with Pt/Family: With patient  Patient left without needs and call bell within reach. RN notified of session outcome.     Treatment Activities: Gait training    Educated the patient to role of physical therapy, plan of care, goals of therapy and HEP, safety with mobility and ADLs, energy conservation  techniques.    Plan:   Treatment/Interventions: Exercise, Neuromuscular re-education, LE strengthening/ROM, Endurance training, Cognitive reorientation, Equipment eval/education, Bed mobility, Compensatory technique education, Patient/family training, Gait training      PT Frequency: 4-5x/wk     Continue plan of care.    Unit: [facility] TOWER 7  Bed: F745/F745.01     Precautions and Contraindications:   Precautions  Other Precautions: falls, decreased B LE strength/sensation    Updated Medical Status/Imaging/Labs:   No results found.     Subjective:   Patient Goal: not stated    "I am feeling better."    Pain Assessment  Pain Assessment: No/denies pain    Patient's medical condition is appropriate for Physical Therapy intervention at this time.  Patient is agreeable to participation in the therapy session. Family and/or guardian are agreeable to patient's participation in the therapy session. Nursing clears patient for therapy.    Objective:   Observation of Patient/Vital Signs:  Patient is in bed with telemetry in place.  Pt wore mask during therapy session:No      Cognition/Neuro Status  Arousal/Alertness: Appropriate responses to stimuli  Attention Span: Appears intact  Orientation Level: Oriented X4  Memory: Appears intact  Following Commands: Follows all commands and directions without difficulty  Safety Awareness: minimal verbal instruction  Insights: Fully aware of deficits;Educated in safety awareness  Problem Solving: Assistance required to identify errors made;Assistance required to generate solutions;Assistance required to implement solutions;minimal assistance  Behavior: calm;cooperative  Motor Planning: ataxia  Coordination: GMC impaired       Functional Mobility:  Supine to Sit:  Contact Guard Assist;Minimal Assist;Increased Time;Increased Effort;HOB raised;to Right  Scooting to EOB: Minimal Assist  Sit to Stand: Minimal Assist;Increased Time;Increased Effort;with instruction for hand  placement to increase safety     PMP - Progressive Mobility Protocol   PMP Activity: Step 6 - Walks in Room  Distance Walked (ft) (Step 6,7): 5 Feet     Ambulation:  Ambulation: Moderate Assist;with front-wheeled walker  Pattern: shuffle;R foot decreased clearance;L foot decreased clearance;decreased cadence;decreased step length;Narrow BOS;ataxic    Patient Participation: good  Patient Endurance: good    Patient left with call bell within reach, all needs met and all questions answered. RN notified of session outcome and patient response.     SCDs: in place  Fall mat: in place  Bed alarm: on  Chair alarm: n/a    Goals:  Goals  Goal Formulation: With patient  Time for Goal Acheivement: 10 visits  Goals: Select goal  Pt Will Roll Left: independent  Pt Will Roll Right: independent  Pt Will Go Supine To Sit: with stand by assist  Pt Will Perform Sit To Supine: with minimal assist (for LEs)  Pt Will Sit Edge of Bed: 11-15 min, modified independent  Pt Will Perform Sit to Stand: with minimal assist  Pt Will Transfer Bed/Chair: with rolling walker, with moderate assist  Pt Will Ambulate: 1-10 feet, with rolling walker, with moderate assist  Pt Will Propel Wheelchair: 51-150 feet, with stand by assist  Pt Will Demo / Request Pressure Relief: modified independent    Time of Treatment  PT Received On: 12/29/21  Start Time: 1235  Stop Time: 1305  Time Calculation (min): 30 min  Treatment # 3 out of 10 visits    PPE worn during session: procedural mask and gloves    Tech present: n/a - Session overlap with OT for safety and to maximize therapeutic benefit.  PPE worn by tech: N/A    Clent Jacks PT, DPT  Pager [phone]

## 2021-12-29 NOTE — OT Progress Note (Signed)
Occupational Therapy Treatment  [name]        Post Acute Care Therapy Recommendations:     Discharge Recommendations:  Acute Rehab    If Acute Rehab  recommended discharge disposition is not available, patient will need max assist for ADLs/transfers and HHOT.     DME needs IF patient is discharging home: [facility], Hospital bed, [facility]    Therapy discharge recommendations may change with patient status.  Please refer to most recent note for up-to-date recommendations.    Patient anticipated to benefit from and to be able to engage in 3 hours of therapy a day for 5 days a week.     Assessment:   Significant Findings: None    Pt received in the bed, agreeable to OT treatment session. Pt making good progress towards goals. Demonstrating improved bed mobility, sit balance, and transfer initiation. Pt participates in functional transfer training, initially with use of railing on back of chair, then able to progress to using RW for sit to stand and side stepping. Pt requires modA for side steps as pt noted with ataxia and continuing to have impaired proprioception in B feet. Pt left in the bed with all needs in reach, all questions answered. Will continue to benefit from skilled OT to maximize functional independence and address deficits.      Treatment Activities: bed mobility, sit balance, transfer training, ADL, therex, education    Educated the patient to role of occupational therapy, plan of care, goals of therapy and HEP, safety with mobility and ADLs, home safety.    Plan:    OT Frequency Recommended: 4-5x/wk     Continue plan of care.    Unit: [facility] TOWER 7  Bed: F745/F745.01       Precautions and Contraindications:   Falls    Updated Medical Status/Imaging/Labs:  Reviewed     Subjective: "I'm done"   Patient's medical condition is appropriate for Occupational Therapy intervention at this time.  Patient is agreeable to participation in the therapy session. Nursing clears patient  for therapy.    Pain:   Denies     Objective:   Patient is in bed with telemetry in place.  Pt wore mask during therapy session:No      Cognition  A/Ox3  Answers questions appropriately: yes  Command Following: good     Functional Mobility  Rolling: SBA  Supine to Sit: minA  Sit to Supine: minA for LE management   Sit to Stand: minA with use of railing on back of chair, min/mod with use of RW    PMP Activity: Step 4 - Dangle at Bedside    Balance  Static Sitting: good  Dynamic Sitting: good  Static Standing: fair+ with BUE support  Dynamic Standing: fair with RW taking side steps, ataxic     Self Care and Home Management  Eating: independent  Grooming: setup at EOB   Bathing: modA from seated   UE Dressing: SBA  LE Dressing: minA donning socks seated at EOB   Toileting: maxA    Therapeutic Exercises/Activities  Incorporated into session    Participation: good   Endurance: good    Patient left with call bell within reach, all needs met, SCDs off, fall mat in place, bed alarm on, chair alarm n/a and all questions answered. RN notified of session outcome and patient response.     Goals:  Time For Goal Achievement: 7 visits  ADL Goals  Patient will groom self: Minimal  Assist, at sinkside, 7 visits  Patient will dress lower body: Minimal Assist, with AE, 7 visits  Mobility and Transfer Goals  Pt will perform functional transfers: Minimal Assist, with rolling walker, 7 visits  Neuro Re-Ed Goals  Pt will perform dynamic sitting balance: Supervision, to increase ability to complete ADLs, 7 visits  Other Goal: pt will demonstrate use of visual compensatory methods 2/2 decreased LE sensation           Vision Goals  Pt with diplopia will complete ADLs with adaptive equipment: modified independent, to locate items within environment for ADLs            PPE worn during session: procedural mask and gloves    Tech present: n/a  PPE worn by tech: N/A    Enid Derry, OTR/L  Pager [phone]      Time of Treatment  OT Received On:  12/29/21  Start Time: 1215  Stop Time: 1250  Time Calculation (min): 35 min    Treatment # 3 of 7 visits

## 2021-12-29 NOTE — Progress Notes (Signed)
[facility]   [facility] PROGRESS NOTE    Today's date & time: 12/29/21 9:43 AM  Patient Name: [name]  Attending Physician: Frederich Cha, DO  Admission Date: 12/25/2021    Assessment:     Active Hospital Problems    Diagnosis    Hypokalemia    Thoracic back pain    Gastroparesis    Weakness of both legs    Guillain Barr syndrome    Type 2 diabetes mellitus    Paroxysmal atrial fibrillation     Plan:   Difficulty walking and bilateral leg and hand weakness - likely from statin induced myositis vs inflammatory myositis vs immune mediated myostitis. patient to has multiple vague complaints who was transferred from outside hospital for consideration of EMG for [name] consideration, and possible IVIG, currently seen by neurology team here and based on her history started the patient on thiamine IV, at this time we will continue to perform EMG, possible IVIG, LP was done and came back unremarkable, got seen by PT and OT team who recommended going to acute rehab, fall precaution, continue monitoring.  Patient had NIF and VC done on 3/5 which showed low volume.  Patient continued proximal muscle weakness, areflexia, highly suspicious for GBS and neurology team started the patient on IVIG on 12/27/2021, after the start of IVIG within the first 10 minutes patient developed numbness on the upper lip on the right side and tongue numbness, was that it was discontinued and was placed as allergy, following that IgG antibodies were ordered, IVIG antibodies ordered, stroke team recommended placing dialysis catheter and doing plasma exchange, pathology team was contacted, patient says every day she is feeling better, currently awaiting the start of plasma exchange.  Patient also had a EMG done on 12/27/2021, based on that CT scan of the abdomen and pelvis was done which showed no hematoma.   MRI L and R thigh or femur W WO r/o myositis   LDH, CPK, LFTs, aldolase, viral hepatitis panel   CPK now improved to  154  Appreciate input from neurology team.   Vitamin D deficiency - repleted.   Upper abdominal and mid back pain - patient complaining of having some pain in the epigastric area banding leg to the mid back, she is placed on PPI, Tums, muscle relaxants, as needed pain medications, may consider placing the patient on scheduled Tylenol, will continue monitoring.  Pain is getting better by the day .   Paroxysmal atrial fibrillation - rate is controlled, patient is on apixaban.  Metoprolol.   Hypertension - blood pressure is controlled, continue monitoring.   COPD - noted, patient is getting Symbicort, in no acute respiratory distress, on oxygen.   Type 2 diabetes mellitus - borderline hemoglobin A1c in the past 2020 at 6.0, currently no treatment, will check hemoglobin A1c tomorrow morning.   Hypokalemia - replaced, monitor closely, magnesium level is also lower, replaced magnesium, now  started BID potassium ordered.   Anemia - most likely poor nutrition and chronic disease, will check iron profile, consider iron infusion.  Multivitamins added.   Bipolar disorder - patient seem to have some issues with behavior, patient is on nortriptyline and Latuda, no acute exacerbation, continue monitoring.   Constipation - patient said last bowel meant a few days ago, stool softeners and laxatives given, had multiple bowel movements on the evening of 12/27/2021, continue monitoring    GI prophylaxis: Protonix, Maalox    Spoke to patient's daughter over the phone and  updated her about patient's condition    Subjective     CC: Weakness of both legs    Interval History/24 hour events: Patient say every day she is getting better, no shortness of breath, no chest pain, back pain seem to be getting better    HPI: HPI per Admitting Provider   " [name] is a 66 y.o. female hx afib on Eliquis, HTN, HLD, DM, COPD, TIAs, COPD, bipolar disorder, [facility] admit 2/7-2/17/23 for rhabdomyolysis (peak CK 4464 - statin d/c'ed) + vomiting  ?gastroparesis + starvation ketoacidosis + UTI, [facility] admit 2/18-2/20/23 for rhabdomyolysis (peak CK 2509) + transaminitis + bilateral leg weakness + right paresthesias, [facility] ER visit 3/2 for bilateral leg weakness who presents to [facility] with difficulty standing and walking, bilateral leg/hand weakness, bilateral leg/hand/face paresthesias. She noted bilateral leg weakness with her prior [facility] admission 2/7-2/17 and was readmitted for bilateral leg weakness 2/18-2/20. The past 4 days she has had difficulty standing and walking. The past 4 days she notes new bilateral hand and face paresthesias and bilateral hand weakness. She has some shortness of breath but is speaking in full sentences. She went to [facility] ER 3/2 and was discharged. She saw her PMD who advised she go to an [facility]. At [facility] she got MRI brain and CTL-spine which did not show a cause for her symptoms. [facility] called neurology Dr. Francesco Sor who said to consider IVIG for 5 days, LP, EMG. The LP was done at [facility]. Dr. Francesco Sor advised transfer to [facility] These symptoms are sudden onset, moderate intensity, without alleviating factors. "     Hospital course:   Following admission, patient was seen by neurology consult who performed MRI of the entire spine and brain, without contrast which came back to show no major issues except on the lumbar area L4-L5 disc disease with facet arthrosis and degenerative changes, neurology team recommended LP and CSF studies so far are unrevealing, patient continued to complain of having bandlike pain around the epigastric area & in the back, there was tenderness on the right side of the thoracic vertebral area in the muscle, Lidoderm patch applied, patient is getting Protonix and Maalox, has not had a bowel movement for that reason given lactulose and stool softeners.  Patient was [facility] for consideration of IVIG with a suspicion of GBS, EMG currently pending.     Due to her history of not  eating well and has been losing weight over the past few weeks, neurology team felt may be nutritional and started on on IV thiamine, patient has been requesting narcotics for the pain that she is got but not really having any tenderness on examination.  Patient did NIF and vital capacity on the morning of 12/26/2021 which came back -12 cm H2O and vital capacity 1.5 L.  Patient is not in any respiratory distress, not needing any oxygen at this time.  Patient had additional laboratory studies including HIV and syphilis which came back negative, negative for COVID 19, potassium was noted to be low for which replacement was given, CPK slightly elevated at 239.  Cultures remain negative.     Patient was seen by PT and OT team who recommended going to acute rehab.  Patient was having constipation, stool softeners and laxatives given.     3/8: Pt feels better.  Diffuse pain improved.  LE weakness improved.      Review of Systems:   General - back pain   Resp - no SOB  CVS - no CP, no CAD   GI - no nausea, no diarrhea   CNS - no visual deficit, generalized  weakness    Physical Exam:     Vitals:    12/28/21 1907 12/28/21 2252 12/29/21 0404 12/29/21 0741   BP: 148/83 123/77 146/81 151/77   Pulse: (!) 106 (!) 115 (!) 109 (!) 110   Resp: 17 15 15 15    Temp: 98.4 F (36.9 C) 97.9 F (36.6 C) 97.9 F (36.6 C) 97.9 F (36.6 C)   TempSrc: Oral Oral Oral Oral   SpO2: 100% 98% 100% 99%   Weight:       Height:            No intake or output data in the 24 hours ending 12/29/21 0943    General: Awake, alert, oriented x 3; no acute distress.  HEENT: PERRLA, eomi, sclera anicteric, oropharynx clear without lesions, mucous membranes moist  Neck: supple, no lymphadenopathy, no thyromegaly, no JVD, no carotid bruits  Cardiovascular: regular rate and rhythm, no murmurs, rubs or gallops  Lungs: clear to auscultation bilaterally, without wheezing, rhonchi, or rales  Abdomen: soft, non-tender, non-distended; no palpable masses, no  hepatosplenomegaly, normoactive bowel sounds, no rebound or guarding  Extremities: no clubbing, cyanosis, or edema  Neuro: cranial nerves grossly intact, strength 5/5 in upper extremities except 3+/5 hand intrinsics, bilateral hip extension 2/5, bilateral knee extension 3/5, bilateral dorsiflexion 3/5, bilateral plantarflexion 4/5, paresthesias bilateral feet/hands/face but intact to light touch, could not elicit knee DTRs, follows commands.   Skin: no rashes or lesions noted  GU: no CVA tenderness     Meds:   Medications were reviewed by me:  Current Facility-Administered Medications   Medication Dose Route Frequency    acetaminophen  650 mg Oral Daily    amLODIPine  2.5 mg Oral Daily    apixaban  5 mg Oral Q12H SCH    bisacodyl  5 mg Oral Daily    budesonide-formoterol  2 puff Inhalation BID    diphenhydrAMINE  25 mg Oral Daily    lidocaine  1 patch Transdermal Q24H    lurasidone  120 mg Oral Daily    metoprolol succinate XL  25 mg Oral Daily    nortriptyline  10 mg Oral QHS    pantoprazole  40 mg Oral Daily    potassium chloride  40 mEq Oral BID    rOPINIRole  1 mg Oral QHS    terazosin  2 mg Oral QHS    thiamine (VITAMIN B-1) IVPB  250 mg Intravenous Q24H SCH    Followed by    Melene Muller ON 12/31/2021] thiamine  100 mg Oral Daily    vitamin D (cholecalciferol)  25 mcg Oral Daily    vitamins/minerals  1 tablet Oral Daily       Labs:   Labs reviewed personally include:  Recent Labs   Lab 12/28/21  0520 12/27/21  0738   WBC 5.88 5.06   Hgb 8.8* 7.5*   Hematocrit 27.6* 23.7*   Platelets 403* 327    Recent Labs   Lab 12/26/21  0536   PT 13.1*   PT INR 1.1   PTT 27       Recent Labs   Lab 12/28/21  0520 12/27/21  0738   Sodium 140 137   Potassium 3.7 3.2*   Chloride 106 103   CO2 23 22   BUN 4.0* 5.0*   Creatinine 0.6 0.6   EGFR >60.0 >60.0  Glucose 89 81   Calcium 9.4 9.0    Recent Labs   Lab 12/24/21  1838   Alkaline Phosphatase 68   Bilirubin, Total 0.4   Protein, Total 7.1   Albumin 3.6   ALT 67*   AST (SGOT) 35             Microbiology Results (last 15 days)       Procedure Component Value Units Date/Time    Culture + Gram Azzie Glatter, CSF [[id number]] Collected: 12/24/21 2232    Order Status: Completed Specimen: Cerebrospinal Fluid from CSF (Lumbar Puncture Spinal Fluid) Updated: 12/28/21 1421    Narrative:      ORDER#: [id number]                                    ORDERED BY: PATITUCCI, LAUR  SOURCE: CSF (Lumbar Puncture Spinal Fluid) lumbar spiCOLLECTED:  12/24/21 22:32  ANTIBIOTICS AT COLL.:                                RECEIVED :  12/27/21 18:51  Stain, Gram                                FINAL       12/27/21 20:56  12/27/21   No WBCs seen             No organisms seen             Stain performed on Cytospin (concentrated) specimen  Culture and Gram Stain, Aerobic, CSF       PRELIM      12/28/21 14:21  12/28/21   Culture no growth to date, Final report to follow      CSF Meningitis/Encephalitis Pathogen Panel PCR [[id number]] Collected: 12/24/21 2232    Order Status: Completed Specimen: CSF (Lumbar Puncture Spinal Fluid) Updated: 12/25/21 1110     CSF Eschericia coli K1 by PCR Not Detected     CSF Haemophilus influenza by PCR Not Detected     CSF Listeria monocytogenes by PCR Not Detected     CSF Neisseria meningitidis (encapsulated) by PCR Not Detected     CSF Streptococcus agalactiae by PCR Not Detected     CSF Streptococcus pneumoniae by PCR Not Detected     CSF Cytomegalovirus by PCR Not Detected     CSF Enterovirus by PCR Not Detected     CSF Herpes simplex virus 1 by PCR Not Detected     CSF Herpes simplex virus 2 by PCR Not Detected     CSF Human herpesvirus 6 by PCR Not Detected     CSF Human parechovirus by PCR Not Detected     CSF Varicella zoster virus by PCR Not Detected     CSF Cryptococcus neoformans/gattii by PCR Not Detected     Comment: Test performed with the FilmArray Meningitis/Encephalitis (ME)  PCR Panel. A negative result does not rule out central nervous  system infection and results from this  panel are not intended  to be used as the sole basis for diagnosis, treatment, or  other patient management decisions. Consider pathogen specific  testing if FilmArray ME is negative but clinical suspicion is  high for a particular infection. False negative results could  be due to the presence of strains with genetic  variability in  the target regions,concentration of pathogen nucleic acid below  the limit of detection, or improper specimen handling.  This test has not been specifically evaluated with CSF from  immunocompromised patients and this test may be affected by  concurrent antimicrobial therapy. Positive results may be due  to detection of non-viable organism. Clinical correlation is  required. False positives may result from contamination during  specimen collection or processing. This test should not be  used for monitoring treatment of infection.         Narrative:      Indication for Meningitis/Encephalitis Panel PCR  Testing:->Very high clinical concern for infectious  encephalitis    COVID-19 (SARS-CoV-2) only (Liat Rapid) asymptomatic admission [[id number]] Collected: 12/24/21 2057    Order Status: Completed Specimen: Nasopharyngeal Updated: 12/24/21 2136     Purpose of COVID testing Screening     SARS-CoV-2 Specimen Source Nasal Swab     SARS CoV 2 Overall Result Not Detected     Comment: __________________________________________________  -A result of "Detected" indicates POSITIVE for the    presence of SARS CoV-2 RNA  -A result of "Not Detected" indicates NEGATIVE for the    presence of SARS CoV-2 RNA  __________________________________________________________  Test performed using the Roche cobas Liat SARS-CoV-2 assay. This assay is  only for use under the Food and Drug Administration's Emergency Use  Authorization. This is a real-time RT-PCR assay for the qualitative  detection of SARS-CoV-2 RNA. Viral nucleic acids may persist in vivo,  independent of viability. Detection of viral nucleic acid  does not imply the  presence of infectious virus, or that virus nucleic acid is the cause of  clinical symptoms. Negative results do not preclude SARS-CoV-2 infection and  should not be used as the sole basis for diagnosis, treatment or other  patient management decisions. Negative results must be combined with  clinical observations, patient history, and/or epidemiological information.  Invalid results may be due to inhibiting substances in the specimen and  recollection should occur. Please see Fact Sheets for patients and providers  located:  WirelessDSLBlog.no         Narrative:      o Collect and clearly label specimen type:  o PREFERRED-Upper respiratory specimen: One Nasal Swab in  Transport Media.  o Hand deliver to laboratory ASAP  Indication for testing->Extended care facility admission to  semi private room  Screening            Imaging personally reviewed, including:  Radiology Results (24 Hour)       Procedure Component Value Units Date/Time    CT Chest WO Contrast [[id number]] Collected: 12/28/21 1437    Order Status: Completed Updated: 12/28/21 1443    Narrative:      HISTORY: LEMS. Evaluate for malignancy.     COMPARISON: None.    TECHNIQUE: CT of the chest performed without intravenous contrast. The  following dose reduction techniques were utilized: automated exposure  control and/or adjustment of the mA and/or KV according to patient size,  and the use of an iterative reconstruction technique.    FINDINGS:     No enlarged lymph nodes in the chest. Right IJ central venous catheter  terminates in the right atrium. Heart within normal limits in size with  trace anterior pericardial fluid. No significant coronary artery  calcifications. Dilated mid ascending aorta to 4.1 cm. Clear lungs. No  suspicious pulmonary nodule or mass. Scattered 2 mm pulmonary nodules  are likely benign. Unremarkable visualized upper abdomen  and osseous  structures. No pleural effusion or  pneumothorax.      Impression:        No evidence of malignancy in the chest.    Carlynn Spry, MD  12/28/2021 2:41 PM    Triple Lumen Dialysis Cath [[id number]] Collected: 12/28/21 1240    Order Status: Completed Updated: 12/28/21 1244    Narrative:      PROCEDURE: Nontunneled dialysis catheter placement.    HISTORY: Guillan Barre syndrome, catheter placement for a pheresis.    OPERATOR(S):  Attending VIR Physician(s): Darci Current, MD  Advanced Practice Provider: None      Impression:        Right IJ 13 French 20 cm trialysis catheter placement. Tip in the deep  right atrium (only place where adequate flow was obtained) .    PLAN:   Catheter ready for use.    PROCEDURE SUMMARY:  *Venous access in ultrasound guidance.  *Nontunneled dialysis catheter placement.    TECHNIQUE/FINDINGS: Informed consent for the procedure including risks,  benefits and alternatives was obtained. A safety timeout was performed  and documented with all members of the procedure team present, including  verification of patient identification, correct procedure, procedure  site, and laterality. The patient was positioned supine.     Preparation: (MIPS): The site was prepared and draped using all elements  of maximal sterile barrier technique including sterile gloves, sterile  gown, cap, mask, large sterile sheet, sterile ultrasound probe cover,  hand hygiene and cutaneous antisepsis with 2% chlorhexidine.  Medical reason for site preparation exception (MIPS): Not applicable.    Sedation: The patient was placed under no additional IV sedation which  was administered/monitored by not applicable.    Sonographic interrogation of the right internal jugular vein  demonstrated patency. Local anesthesia was administered at the access  site. A small dermatotomy an #11 scalpel was made and venous access was  achieved with a 21 gauge single wall needle. Sonographic images were  obtained pre- and post- puncture for documentation.  A 0.018 inch  guidewire was  advanced into the inferior vena cava. The needle was  exchanged for a 5 French coaxial dilator system. Using fluoroscopic  guidance, the 0.018 inch guidewire was removed and marked at the  appropriate length for the intravascular portion of the catheter and a  0.035 inch wire was then advanced into the inferior vena cava.  Sequential dilation was performed over the wire and the tiialysis  catheter was advanced. The wire was removed. The catheter were evaluated  for appropriate flow and were flushed with normal saline. The catheter  was secured to the skin with nonabsorbable suture. A sterile dressing  was applied. The procedure was well tolerated.  A fluoroscopic image was  obtained to document catheter tip position at the right atrium.    All indicated images were saved to PACS.    CONTRAST:  Contrast agent: None  Contrast volume (mL): 0    RADIATION PARAMETERS:  Fluoroscopy time (minutes): 0.6    Reference air kerma (mGy): 3.6       Darci Current, MD  12/28/2021 12:42 PM    CT Abdomen Pelvis WO IV/ WO PO Cont [[id number]] Collected: 12/28/21 1117    Order Status: Completed Updated: 12/28/21 1128    Narrative:      HISTORY:  Proximal bilateral lower extremity weakness question hematoma    COMPARISON: None available.    TECHNIQUE: CT of the abdomen and pelvis performed without intravenous  contrast. The  following dose reduction techniques were utilized:  automated exposure control and/or adjustment of the mA and/or KV  according to patient size, and the use of an iterative reconstruction  technique.    FINDINGS:  Please note that evaluation of the viscera and vasculature is limited in  the absence of IV contrast.     A central venous catheter is present with its tip in the right atrium.  The urinary bladder is distended. The colon is fluid-filled and mildly  distended. There is no definite acute bowel pathology. A small focal fat  attenuation lesion is noted at the dome of the liver. Mild disc space  narrowing is noted at  L4-L5. The gallbladder is present and  nondistended. The spleen, adrenals, kidneys, and pancreas are  unremarkable to the limits of this noncontrast CT      Impression:        Distended urinary bladder. Otherwise unremarkable noncontrast abdomen  pelvis CT.    Rocky Crafts, MD  12/28/2021 11:26 AM            Safety Checklist:     DVT prophylaxis:  CHEST guideline (See page [id number]) Chemical and Mechanical   Foley:  [place] Rn Foley protocol Not present   IVs:  Peripheral IV   PT/OT: Ordered   Daily CBC & or Chem ordered:  SHM/ABIM guidelines (see #5) Yes, due to clinical and lab instability   Reference for approximate charges of common labs: CBC auto diff - $76  BMP - $99  Mg - $79    Lines:          Patient Lines/Drains/Airways Status       Active PICC Line / CVC Line / PIV Line / Drain / Airway / Intraosseous Line / Epidural Line / ART Line / Line / Wound / Pressure Ulcer / NG/OG Tube       Name Placement date Placement time Site Days    Peripheral IV 12/25/21 20 G Standard Right Antecubital 12/25/21  0600  Antecubital  1                     Disposition:   Today's date: 12/29/2021  Length of Stay: 4  Anticipated medical stability for discharge: March,  8 - Afternoon  Reason for ongoing hospitalization: Generalized weakness  Anticipated discharge needs: Acute rehab    Signed by: Frederich Cha, DO

## 2021-12-29 NOTE — Plan of Care (Signed)
Vit d low  CT chest neg for malig  MRI thighs pending    Nutritional def., is the leading etio v autoimm process    I added vit d supp  Thiamine  b1 levels pending  CK added to trend

## 2021-12-29 NOTE — Nursing Progress Note (Signed)
NURSING PROGRESS NOTE    Patient Name: [name] (66 y.o. female)  Admission Date: 12/25/2021 [facility] Day 4)    Shift Note: Patient A&O x4, FC, No tele, room air. Decrease sensation on left side upper and lower extremities. Strength 4/5 in all extremities. Dilaudid x1 given for complaints of pain at R IJ [place] cath site. No significant overnight events, all safety precautions in place.     Recent Labs   Lab 12/28/21  0520   Sodium 140   Potassium 3.7   Chloride 106   CO2 23   BUN 4.0*   Creatinine 0.6   EGFR >60.0   Glucose 89   Calcium 9.4       Recent Labs   Lab 12/28/21  0520   WBC 5.88   Hgb 8.8*   Hematocrit 27.6*   Platelets 403*         Patient Lines/Drains/Airways Status       Active Lines, Drains and Airways       Name Placement date Placement time Site Days    Temporary Catheter with Pigtail 12/28/21 Internal Jugular Right 12/28/21  1015  --  less than 1    Peripheral IV 12/25/21 20 G Standard Right Antecubital 12/25/21  0600  Antecubital  3                    MEWS Score: 2    Last BM: 12/29/21  Pending Orders: plasmapheresis  Discharge Plan: TBD    Safety Checklist   Fall Precautions Y   Avasys N   Seizure Precautions N   Aspiration Precautions N   Belongings Checked Y     Service Essentials  Medication Teaching Y   Purposeful Hourly Rounding Y       Interpreter Services:  Does the patient require an Interpreter? N    If yes, what form of interpreter services was used? N/A     If family was utilized, is interpreter waiver form signed and in the chart? N/A

## 2021-12-30 ENCOUNTER — Inpatient Hospital Stay: Payer: 59

## 2021-12-30 LAB — BASIC METABOLIC PANEL
Anion Gap: 10 (ref 5.0–15.0)
BUN: 6 mg/dL — ABNORMAL LOW (ref 7.0–21.0)
CO2: 23 mEq/L (ref 17–29)
Calcium: 9.1 mg/dL (ref 8.5–10.5)
Chloride: 101 mEq/L (ref 99–111)
Creatinine: 0.6 mg/dL (ref 0.4–1.0)
Glucose: 100 mg/dL (ref 70–100)
Potassium: 3.9 mEq/L (ref 3.5–5.3)
Sodium: 134 mEq/L — ABNORMAL LOW (ref 135–145)

## 2021-12-30 LAB — CBC
Absolute NRBC: 0 10*3/uL (ref 0.00–0.00)
Hematocrit: 25.2 % — ABNORMAL LOW (ref 34.7–43.7)
Hgb: 7.9 g/dL — ABNORMAL LOW (ref 11.4–14.8)
MCH: 25.1 pg (ref 25.1–33.5)
MCHC: 31.3 g/dL — ABNORMAL LOW (ref 31.5–35.8)
MCV: 80 fL (ref 78.0–96.0)
MPV: 8.4 fL — ABNORMAL LOW (ref 8.9–12.5)
Nucleated RBC: 0 /100 WBC (ref 0.0–0.0)
Platelets: 389 10*3/uL — ABNORMAL HIGH (ref 142–346)
RBC: 3.15 10*6/uL — ABNORMAL LOW (ref 3.90–5.10)
RDW: 16 % — ABNORMAL HIGH (ref 11–15)
WBC: 8.82 10*3/uL (ref 3.10–9.50)

## 2021-12-30 LAB — LACTATE DEHYDROGENASE: LDH: 450 U/L — ABNORMAL HIGH (ref 120–331)

## 2021-12-30 LAB — CK: Creatine Kinase (CK): 84 U/L (ref 29–233)

## 2021-12-30 LAB — GFR: EGFR: >60

## 2021-12-30 MED ORDER — GADOTERATE MEGLUMINE 7.5 MMOL/15ML IV SOLN (CLARISCAN)
0.2000 mL/kg | Freq: Once | INTRAVENOUS | Status: AC | PRN
Start: 2021-12-30 — End: 2021-12-30
  Administered 2021-12-30: 15 mL via INTRAVENOUS

## 2021-12-30 MED ORDER — VITAMIN B-6 50 MG PO TABS
50.0000 mg | ORAL_TABLET | Freq: Every day | ORAL | Status: DC
Start: 2021-12-30 — End: 2022-01-03
  Administered 2021-12-30 – 2022-01-03 (×5): 50 mg via ORAL
  Filled 2021-12-30 (×6): qty 1

## 2021-12-30 MED ORDER — ZINC SULFATE 220 (50 ZN) MG PO CAPS
220.0000 mg | ORAL_CAPSULE | Freq: Every day | ORAL | Status: DC
Start: 2021-12-30 — End: 2022-01-02
  Administered 2021-12-30 – 2022-01-02 (×4): 220 mg via ORAL
  Filled 2021-12-30 (×5): qty 1

## 2021-12-30 NOTE — UM Notes (Signed)
PATIENT NAME: [name],[name]   DOB: [birth year]   PMH:  has a past medical history of Anemia, Asthma, Atrial fibrillation, Chronic obstructive pulmonary disease, Convulsions, Depression, Diabetes mellitus, Hyperlipidemia, Hypertension, and TIA (transient ischemic attack).  PSH:  has a past surgical history that includes Hysterectomy; pci; and Cardiac catheterization (2017).     Continued Stay Review  Identified [place] barriers:   PT/OT Discharge Recommendation: Acute Rehab  _____________________  CSR Date: 12/29/21, Unit: [facility] 7    Subjective: Patient say every day she is getting better, no shortness of breath, no chest pain, back pain seem to be getting better  PT recommending acute rehab      VS:   I&O   Temp:  [97.9 F (36.6 C)-98.7 F (37.1 C)]   Heart Rate:  [110-121]   Resp Rate:  [16-18]   BP: (122-181)/(73-99)   SpO2:  [99 %-100 %]   I/O last 3 completed shifts:  In: 240 [P.O.:240]  Out: -         Abnormal Labs: n/a    Diagnostics: n/a    Medications:   Current Facility-Administered Medications   Medication Dose Route Frequency    acetaminophen  650 mg Oral Daily    amLODIPine  2.5 mg Oral Daily    apixaban  5 mg Oral Q12H SCH    bisacodyl  5 mg Oral Daily    budesonide-formoterol  2 puff Inhalation BID    diphenhydrAMINE  25 mg Oral Daily    lidocaine  1 patch Transdermal Q24H    lurasidone  120 mg Oral Daily    metoprolol succinate XL  25 mg Oral Daily    nortriptyline  10 mg Oral QHS    pantoprazole  40 mg Oral Daily    potassium chloride  40 mEq Oral BID    rOPINIRole  1 mg Oral QHS    terazosin  2 mg Oral QHS    [START ON 12/31/2021] thiamine  100 mg Oral Daily    vitamin B-6  50 mg Oral Daily    vitamin D (cholecalciferol)  25 mcg Oral Daily    vitamins/minerals  1 tablet Oral Daily    zinc sulfate  220 mg Oral Daily       IV PRN meds received within last 24 hrs:    Benadryl IV x 1, Hydralazine IV x 1    Assessment/Plan: Vit d low  CT chest neg for malig  MRI thighs pending    Nutritional def., is the leading etio v autoimm process  added vit d supp  Thiamine  b1 levels pending  CK added to trend      Primary Payor: MEDICARE [employer] / Plan: [employer] MEDIBLUE DUAL [employer] SNP / Product Type: MANAGED MEDICARE /      UTILIZATION REVIEW CONTACT: Name: Bobette Mo. Karel Jarvis RN BSN  Clinical Case Manager  - Utilization Review  [facility]  Address:  [address] [place], [place]  [address]  NPI:   [id number]  Tax ID:  [id number]  Phone: [phone]  Fax: [phone]    Please use fax number [phone] to provide authorization for hospital services or to request additional information.        NOTES TO REVIEWER:    This clinical review is based on/compiled from documentation provided by the treatment team within the patient's medical record.

## 2021-12-30 NOTE — Preadmission Screening Note (Signed)
Physical Medicine and Rehabilitation  Clinical Liaison Preadmission Screening    Patient Name:  [name]       Medical Record Number: [id number]     Probable 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.4 - Neurologic Conditions: Guillain-Barr Syndrome    Demographics  Date of Birth: November 16, 1955  Age: 66 y.o.  Sex: Female   Contact PersonCampbell [name] [phone] Cell [phone] Daughter        Past Medical History:   Diagnosis Date    Anemia     Asthma     Atrial fibrillation     Chronic obstructive pulmonary disease     Convulsions     Depression     Diabetes mellitus     diet controlled     Hyperlipidemia     Hypertension     TIA (transient ischemic attack)        Past Surgical History:   Procedure Laterality Date    CARDIAC CATHETERIZATION  2017    HYSTERECTOMY      pci      TRIPLE LUMEN DIALYSIS CATH N/A 12/28/2021    Procedure: TRIPLE LUMEN DIALYSIS CATH;  Surgeon: [name] Bari, MD;  Location: [facility]ARDI[facility] CATH;  Service: Interventional Radiology;  Laterality: N/A;       Prior Level of Functioning:  Mobility  Rolling: Independent (without device)  Supine to sit: Independent (without device)  Sit to stand: Independent (without device)  Transfers out of bed: Independent (without device)  Transfers to toilet: Independent (without device)  Ambulation: Independent (without device)    Activities of Daily Living  Eating: Independent (without device)  Grooming: Independent (without device)  Bathing upper body: Independent (without device)  Bathing lower body: Independent (without device)  Dressing upper body: Independent (without device)  Dressing lower body: Independent (without device)  Toileting: Independent (without device)    CARE Tool Items:   Self-Care: Needed some help  Indoor Mobility (Ambulation): Independent  Stairs: Independent  Cognition: Independent  Devices: Orthotics/Prosthetics  Swallowing/Nutritional Status: Regular food  Has the patient had two or more falls in the past year or any falls with injury in the past  year?: No  Did the patient have major surgery during the 100 days prior to admission?: No    Social History  Marital Status: divorced  Does patient have children?: Yes   How many children does the patient have?: 1  Where do the children reside?: Patient lives with daughter  Employment status: Retired  Recreational activities/hobbies: N/A  Additional social history: N/A  Pre-hospital living environment: Lives with daughter in 2 level home with 3 STE, stays in basement with 1 FOS to get down, everthing available in basement. Daughter at home to help at discharge  Language: English  Hand dominance: right      The following information was gathered for consideration and maintenance in the medical record to substantiate medical necessity for an IRF level of care.    This assessment is being completed by [name] Abbott, RN. [name] is currently at [facility] unit # [phone]. [name] is being referred and recommended by their physician, [name], to be assessed both medically and functionally in regard to their premorbid functional capacity to determine whether they can benefit from a rehabilitation level of care offered by our facility. The following is information regarding the medical complexity and clinical risk factors that need to be considered for the appropriate management of [name].  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.4 - Neurologic Conditions: Guillain-Barr Syndrome    Comorbid Conditions:   Patient Active Problem List   Diagnosis    CVA (cerebral vascular accident)    Chest pain    Chest pain with high risk of acute coronary syndrome    Paroxysmal atrial fibrillation    Hypertension    Hyperlipidemia    Seizure disorder    History of gastrointestinal hemorrhage    Depression    Anemia    Asthma    Non-traumatic subcutaneous emphysema    Chest pain with moderate risk for cardiac etiology    Left-sided weakness    History of asthma    History of transient ischemic attack (TIA)     Type 2 diabetes mellitus    Syncope and collapse    Weakness of both legs    Hypokalemia    Thoracic back pain    Gastroparesis    Guillain Barr syndrome       History of Present Illness: [name] is a 66 y.o. female hx afib on Eliquis, HTN, HLD, DM, COPD, TIAs, COPD, bipolar disorder, [facility] admit 2/7-2/17/23 for rhabdomyolysis (peak CK 4464 - statin d/c'ed) + vomiting ?gastroparesis + starvation ketoacidosis + UTI, [facility] admit 2/18-2/20/23 for rhabdomyolysis (peak CK 2509) + transaminitis + bilateral leg weakness + right paresthesias, [facility] ER visit 3/2 for bilateral leg weakness who presents to [facility] with difficulty standing and walking, bilateral leg/hand weakness, bilateral leg/hand/face paresthesias. She noted bilateral leg weakness with her prior [facility] admission 2/7-2/17 and was readmitted for bilateral leg weakness 2/18-2/20. The past 4 days she has had difficulty standing and walking. The past 4 days she notes new bilateral hand and face paresthesias and bilateral hand weakness. She has some shortness of breath but is speaking in full sentences. She went to [facility] ER 3/2 and was discharged. She saw her PMD who advised she go to an [facility]. At [facility] she got MRI brain and CTL-spine which did not show a cause for her symptoms. [facility] called neurology Dr. Francesco Sor who said to consider IVIG for 5 days, LP, EMG. The LP was done at [facility]. Dr. Francesco Sor advised transfer to [facility]. These symptoms are sudden onset, moderate intensity, without alleviating factors.      Following admission, patient was seen by neurology consult who performed MRI of the entire spine and brain, without contrast which came back to show no major issues except on the lumbar area L4-L5 disc disease with facet arthrosis and degenerative changes, neurology team recommended LP and CSF studies so far are unrevealing, patient continued to complain of having bandlike pain around the epigastric area & in the back, there was tenderness on  the right side of the thoracic vertebral area in the muscle, Lidoderm patch applied, patient is getting Protonix and Maalox, has not had a bowel movement for that reason given lactulose and stool softeners.  Patient was [facility] from [facility] for consideration of IVIG with a suspicion of GBS, EMG currently pending.      Due to her history of not eating well and has been losing weight over the past few weeks, neurology team felt may be nutritional and started on on IV thiamine, patient has been requesting narcotics for the pain that she is got but not really having any tenderness on examination.  Patient did NIF and vital capacity on the morning of 12/26/2021 which came back -12 cm H2O and vital capacity 1.5 L.  Patient is not in any  respiratory distress, not needing any oxygen at this time.  Patient had additional laboratory studies including HIV and syphilis which came back negative, negative for COVID 19, potassium was noted to be low for which replacement was given, CPK slightly elevated at 239.  Cultures remain negative.      Patient is A&O X 4 and following commands, she is tolerating a regular diet, last BM was 3/11. No hearing problems, wears glasses for reading. Lives with daughter is usually able to walk around the house, uses a RW for community level ambulation, patient usually requires help from her daughter for LE bathing but otherwise independent. Patient with IJ Line which was placed for PLEX, however no longer going to have PLEX so it will be discontinued. Patient is working with PT and OT and would benefit from inpatient AR to get her as close to her pre-morbid status as possible and to manage his co-morbid conditions as above.     Date of Onset: 12/25/21  Date Admitted to Acute: 12/25/21  Current Precautions: risk for falls and skin fragility  Allergies:   Allergies   Allergen Reactions    Singulair [Montelukast] Itching    Immune Globulin (Human) Itching and Facial Swelling     Pt complain of  itchiness and swelling of lips and tongue. Saturation fine, bp elevated.    Myoview [Technetium-69m] Itching    Contrast [Iodinated Contrast Media]     Latex     Nitroglycerin     Penicillins     Tetracyclines & Related        Present Symptoms Summary  Vitals  BP: 94/64 (01/03/2022 12:30 PM)  Temp: 98.2 F (36.8 C) (01/03/2022 12:30 PM)  Temp Source: Oral (01/03/2022 12:30 PM)  Heart Rate: (!) 118 (01/03/2022 12:30 PM)  Resp Rate: 18 (01/03/2022 12:30 PM)  SpO2: 100 % (01/03/2022 12:30 PM)  Height: 1.651 m (5\' 5" ) (12/26/2021  8:01 PM)  Weight: 72.6 kg (160 lb) (12/26/2021  8:01 PM)      Is patient hard of hearing: No  Hearing aid usage: no hearing aids  Does patient have vision issues: wears glasses  Other medical comments: See HPI    Diet Consistency: Regular  Diet Type: Cardiac  Liquid Consistency: Thin  Bladder: Incontinent  Last Bowel Movement: 3/11  Bowel: Continent  Integumentary:  Excoriated peri area, scattered bruising  Cardiopulmonary: Room air  Dialysis: No    Medication:   No current facility-administered medications for this visit.     Current Outpatient Medications   Medication Sig Dispense Refill    albuterol sulfate HFA (PROVENTIL) 108 (90 Base) MCG/ACT inhaler Inhale 2 puffs into the lungs every 6 (six) hours as needed for Wheezing 1 each 0    calcium carbonate (TUMS) 500 MG chewable tablet Chew 2 tablets (1,000 mg) by mouth every 6 (six) hours as needed for Heartburn 30 tablet 0    diphenhydrAMINE (BENADRYL) 25 mg capsule Take 1 capsule (25 mg) by mouth every 6 (six) hours as needed for Itching 60 capsule 0    ferrous sulfate 324 (65 FE) MG Tablet Delayed Response Take 1 tablet (324 mg) by mouth every other day 30 tablet 0    HYDROmorphone (DILAUDID) 2 MG tablet Take 1 tablet (2 mg) by mouth every 6 (six) hours as needed for Pain 20 tablet 0    lidocaine (LIDODERM) 5 % Place 1 patch onto the skin every 24 hours Remove & Discard patch within 12 hours or as directed by MD 30  patch 0    [START ON 01/04/2022]  losartan (COZAAR) 25 MG tablet Take 1 tablet (25 mg) by mouth daily 30 tablet 0    melatonin 3 mg tablet Take 1 tablet (3 mg) by mouth nightly as needed for Sleep 30 tablet 0    [START ON 01/04/2022] metoprolol succinate XL (TOPROL-XL) 100 MG 24 hr tablet Take 1 tablet (100 mg) by mouth daily 30 tablet 0    nortriptyline (PAMELOR) 10 MG capsule Take 1 capsule (10 mg) by mouth nightly 30 capsule 0    nystatin (MYCOSTATIN) 100000 UNIT/ML suspension Take 5 mLs (500,000 Units) by mouth 4 (four) times daily 473 mL 0    ondansetron (ZOFRAN-ODT) 4 MG disintegrating tablet Take 1 tablet (4 mg) by mouth every 6 (six) hours as needed for Nausea 30 tablet 0    senna-docusate (PERICOLACE) 8.6-50 MG per tablet Take 2 tablets by mouth 2 (two) times daily as needed for Constipation 60 tablet 0    [START ON 01/04/2022] thiamine (B-1) 100 MG tablet Take 1 tablet (100 mg) by mouth daily 30 tablet 0    [START ON 01/04/2022] vitamin B-6 (B-6) 50 MG tablet Take 1 tablet (50 mg) by mouth daily 30 tablet 0    [START ON 01/04/2022] vitamin D (vitamin D3) 25 MCG (1000 UT) tablet Take 1 tablet (25 mcg) by mouth daily 30 tablet 0    [START ON 01/04/2022] vitamins/minerals Tab Take 1 tablet by mouth daily 30 tablet 0     Facility-Administered Medications Ordered in Other Visits   Medication Dose Route Frequency Provider Last Rate Last Admin    acetaminophen (TYLENOL) tablet 650 mg  650 mg Oral Q6H PRN Sheran Luz D, MD   650 mg at 12/31/21 1610    Or    acetaminophen (TYLENOL) suppository 650 mg  650 mg Rectal Q6H PRN Sheran Luz D, MD        albuterol sulfate HFA (PROVENTIL) inhaler 2 puff  2 puff Inhalation Q6H PRN Emeline Darling, MD        alum & mag hydroxide-simethicone (MAALOX PLUS) 200-200-20 mg/5 mL suspension 30 mL  30 mL Oral Q4H PRN [name], Zelalem A, MD        apixaban (ELIQUIS) tablet 5 mg  5 mg Oral Q12H [facility] Sheran Luz D, MD   5 mg at 01/03/22 0916    bisacodyl (DULCOLAX) EC tablet 5 mg  5 mg Oral Daily [name], Zelalem A, MD   5 mg at  01/03/22 0916    budesonide-formoterol (SYMBICORT) 80-4.5 MCG/ACT inhaler 1 puff  1 puff Inhalation BID Frederich Cha, DO        calcium carbonate (TUMS) chewable tablet 1,000 mg  1,000 mg Oral Q6H PRN Sheran Luz D, MD        diphenhydrAMINE (BENADRYL) capsule 25 mg  25 mg Oral Q6H PRN Frederich Cha, DO        diphenhydrAMINE (BENADRYL) injection 25 mg  25 mg Intravenous Q6H PRN Frederich Cha, DO        diphenhydrAMINE (BENADRYL) injection 6.25 mg  6.25 mg Intravenous Q6H PRN [name], Zelalem A, MD   6.25 mg at 01/03/22 1016    gabapentin (NEURONTIN) capsule 100 mg  100 mg Oral TID PRN Sheran Luz D, MD   100 mg at 12/29/21 0912    hydrALAZINE (APRESOLINE) injection 10 mg  10 mg Intravenous Q2H PRN Sheran Luz D, MD   10 mg at 12/29/21 1951    HYDROmorphone (DILAUDID) tablet 2  mg  2 mg Oral Q4H PRN Sheran Luz D, MD   2 mg at 01/02/22 2059    labetalol (NORMODYNE,TRANDATE) injection 10 mg  10 mg Intravenous Q2H PRN Sheran Luz D, MD        lidocaine (LIDODERM) 5 % 1 patch  1 patch Transdermal Q24H [name], Zelalem A, MD   1 patch at 01/03/22 1007    losartan (COZAAR) tablet 25 mg  25 mg Oral Daily [name] Fanny W, [place]   25 mg at 01/03/22 0915    lurasidone (LATUDA) tablet 120 mg  120 mg Oral Daily Sheran Luz D, MD   120 mg at 01/03/22 0919    magnesium sulfate 1g in dextrose 5% IVPB (premix)  1 g Intravenous PRN Sheran Luz D, MD        melatonin tablet 3 mg  3 mg Oral QHS PRN Sheran Luz D, MD   3 mg at 12/30/21 2131    [START ON 01/04/2022] metoprolol succinate XL (TOPROL-XL) 24 hr tablet 100 mg  100 mg Oral Daily Cyndie Chime, Ricardo Jericho, DO        naloxone [facility]) injection 0.2 mg  0.2 mg Intravenous PRN Sheran Luz D, MD        nortriptyline (PAMELOR) capsule 10 mg  10 mg Oral QHS [name], Zelalem A, MD   10 mg at 01/02/22 2232    nystatin (MYCOSTATIN) 100000 UNIT/ML suspension 500,000 Units  500,000 Units Oral QID Frederich Cha, DO        ondansetron (ZOFRAN-ODT) disintegrating tablet 4 mg  4 mg Oral Q6H PRN Sheran Luz  D, MD   4 mg at 01/03/22 1007    Or    ondansetron (ZOFRAN) injection 4 mg  4 mg Intravenous Q6H PRN Sheran Luz D, MD   4 mg at 01/03/22 0223    pantoprazole (PROTONIX) EC tablet 40 mg  40 mg Oral Daily Sheran Luz D, MD   40 mg at 01/03/22 0916    potassium chloride (KLOR-CON M20) CR tablet 40 mEq  40 mEq Oral BID [name], Zelalem A, MD   40 mEq at 01/03/22 0914    rOPINIRole (REQUIP) tablet 1 mg  1 mg Oral QHS Sheran Luz D, MD   1 mg at 01/02/22 2200    senna-docusate (PERICOLACE) 8.6-50 MG per tablet 2 tablet  2 tablet Oral BID PRN Sheran Luz D, MD   2 tablet at 12/26/21 1820    terazosin (HYTRIN) capsule 2 mg  2 mg Oral QHS Sheran Luz D, MD   2 mg at 01/02/22 2231    thiamine (VITAMIN B1) tablet 100 mg  100 mg Oral Daily Delice Bison, MD   100 mg at 01/03/22 0920    vitamin B-6 (PYRIDOXINE) tablet 50 mg  50 mg Oral Daily Cyndie Chime, Ricardo Jericho, DO   50 mg at 01/03/22 0915    vitamin D (cholecalciferol) tablet 25 mcg  25 mcg Oral Daily Lendon Colonel, MD   25 mcg at 01/03/22 0916    vitamins/minerals tablet 1 tablet  1 tablet Oral Daily [name], Zelalem A, MD   1 tablet at 01/03/22 1610       Alcohol/Tobacco/Drug Use:  [name] reports no history of alcohol use. Verdell Kincannon reports that she has never smoked. She has never used smokeless tobacco. [name] reports no history of drug use.    Pain: Yes; acute  Plan:      Lab Results (past 2 days):  Component   Ref Range &  Units 01/03/22  3:12 AM 01/01/22  3:53 AM 12/31/21  3:56 AM 12/30/21  6:06 PM 12/28/21  5:20 AM 12/27/21  7:38 AM 12/26/21  5:36 AM   Glucose   70 - 100 mg/dL 96  062 High  CM  86 CM  100 CM  89 CM  81 CM  72 CM    Comment: ADA guidelines for diabetes mellitus:   Fasting:  Equal to or greater than 126 mg/dL   Random:   Equal to or greater than 200 mg/dL    BUN   7.0 - 37.6 mg/dL 28.3  9.0  6.0 Low   6.0 Low   4.0 Low   5.0 Low   6.0 Low     Creatinine   0.4 - 1.0 mg/dL 0.6  0.6  0.6  0.6  0.6  0.6  0.6    Sodium   135 - 145 mEq/L 132 Low   133 Low   134 Low    134 Low   140  137  133 Low     Potassium   3.5 - 5.3 mEq/L 3.8  4.3  3.8  3.9  3.7  3.2 Low   2.8 Low Panic  CM    Chloride   99 - 111 mEq/L 101  100  101  101  106  103  98 Low     CO2   17 - 29 mEq/L 20  18  21  23  23  22  24     Calcium   8.5 - 10.5 mg/dL 9.1  9.2  9.3  9.1  9.4  9.0  8.8    Anion Gap   5.0 - 15.0 11.0  15.0 CM  12.0 CM  10.0 CM  11.0 CM  12.0 CM  11.0     Component   Ref Range & Units 01/03/22  3:12 AM 01/01/22  3:53 AM 12/31/21  3:56 AM 12/30/21  6:06 PM 12/28/21  5:20 AM 12/27/21  7:38 AM 12/26/21  5:36 AM   WBC   3.10 - 9.50 x10 3/uL 8.27  5.27  7.67  8.82  5.88  5.06  4.66    Hgb   11.4 - 14.8 g/dL 8.6 Low   8.1 Low   8.1 Low   7.9 Low   8.8 Low   7.5 Low   7.7 Low     Hematocrit   34.7 - 43.7 % 26.6 Low   26.2 Low   26.2 Low   25.2 Low   27.6 Low   23.7 Low   24.6 Low     Platelets   142 - 346 x10 3/uL 418 High   369 High   370 High   389 High   403 High   327  331    RBC   3.90 - 5.10 x10 6/uL 3.39 Low   3.28 Low   3.28 Low   3.15 Low   3.40 Low   2.98 Low   3.08 Low     MCV   78.0 - 96.0 fL 78.5  79.9  79.9  80.0  81.2  79.5  79.9    MCH   25.1 - 33.5 pg 25.4  24.7 Low   24.7 Low   25.1  25.9  25.2  25.0 Low     MCHC   31.5 - 35.8 g/dL 15.1  76.1 Low   60.7 Low   31.3 Low   31.9  31.6  31.3 Low  RDW   11 - 15 % 16 High   16 High   16 High   16 High   16 High   16 High   16 High     MPV   8.9 - 12.5 fL 8.5 Low   8.7 Low   8.6 Low   8.4 Low   8.6 Low   8.3 Low   8.3 Low     Nucleated RBC   0.0 - 0.0 /100 WBC 0.0  0.0  0.0  0.0  0.0  0.0  0.0    Absolute NRBC   0.00 - 0.00 x10 3/uL 0.00  0.00  0.00  0.00  0.00  0.00  0.00        Radiology (past 7 days):  CT Abdomen Pelvis WO IV/ WO PO Cont    Result Date: 12/28/2021  Distended urinary bladder. Otherwise unremarkable noncontrast abdomen pelvis CT. Rocky Crafts, MD 12/28/2021 11:26 AM    CT Chest WO Contrast    Result Date: 12/28/2021  No evidence of malignancy in the chest. Carlynn Spry, MD 12/28/2021 2:41 PM    CT Angiogram Chest    Result Date:  01/01/2022  1.Limited evaluation of the subsegmental pulmonary arteries. No evidence of pulmonary embolism given this limitation. 2.Dilated ascending aorta measuring up to 4.1 cm. Demetrios Isaacs, MD 01/01/2022 7:57 AM    MRI Femur Right W WO Contrast    Result Date: 12/30/2021  1. Mild edema and atrophy involving the semimembranosus and biceps femoris muscles of the right and left thighs, noted to be bilateral and symmetric in appearance. Mild enhancement on the postcontrast images. Very mild edema in the anterior compartment muscles of the right and left thighs, with mild enhancement. Mild atrophy of the semimembranosus and biceps femoris muscles bilaterally. These findings are nonspecific, and may be seen in the setting of muscle denervation or myositis. 2. No atrophy involving the anterior compartment muscles. 3. Mild edema in the proximal adductor longus and adductor magnus muscles bilaterally. 4. No areas of nonenhancement to suggest muscle necrosis. [name], MD 12/30/2021 3:22 PM    MRI Femur Left W WO Contrast    Result Date: 12/30/2021  1. Mild edema and atrophy involving the semimembranosus and biceps femoris muscles of the right and left thighs, noted to be bilateral and symmetric in appearance. Mild enhancement on the postcontrast images. Very mild edema in the anterior compartment muscles of the right and left thighs, with mild enhancement. Mild atrophy of the semimembranosus and biceps femoris muscles bilaterally. These findings are nonspecific, and may be seen in the setting of muscle denervation or myositis. 2. No atrophy involving the anterior compartment muscles. 3. Mild edema in the proximal adductor longus and adductor magnus muscles bilaterally. 4. No areas of nonenhancement to suggest muscle necrosis. [name], MD 12/30/2021 3:22 PM    Triple Lumen Dialysis Cath    Result Date: 12/28/2021  Right IJ 13 French 20 cm trialysis catheter placement. Tip in the deep right atrium (only place where adequate  flow was obtained) . PLAN: Catheter ready for use. PROCEDURE SUMMARY: *Venous access in ultrasound guidance. *Nontunneled dialysis catheter placement. TECHNIQUE/FINDINGS: Informed consent for the procedure including risks, benefits and alternatives was obtained. A safety timeout was performed and documented with all members of the procedure team present, including verification of patient identification, correct procedure, procedure site, and laterality. The patient was positioned supine. Preparation: (MIPS): The site was prepared and draped using all elements of maximal sterile  barrier technique including sterile gloves, sterile gown, cap, mask, large sterile sheet, sterile ultrasound probe cover, hand hygiene and cutaneous antisepsis with 2% chlorhexidine. Medical reason for site preparation exception (MIPS): Not applicable. Sedation: The patient was placed under no additional IV sedation which was administered/monitored by not applicable. Sonographic interrogation of the right internal jugular vein demonstrated patency. Local anesthesia was administered at the access site. A small dermatotomy an #11 scalpel was made and venous access was achieved with a 21 gauge single wall needle. Sonographic images were obtained pre- and post- puncture for documentation.  A 0.018 inch guidewire was advanced into the inferior vena cava. The needle was exchanged for a 5 French coaxial dilator system. Using fluoroscopic guidance, the 0.018 inch guidewire was removed and marked at the appropriate length for the intravascular portion of the catheter and a 0.035 inch wire was then advanced into the inferior vena cava. Sequential dilation was performed over the wire and the tiialysis catheter was advanced. The wire was removed. The catheter were evaluated for appropriate flow and were flushed with normal saline. The catheter was secured to the skin with nonabsorbable suture. A sterile dressing was applied. The procedure was well  tolerated.  A fluoroscopic image was obtained to document catheter tip position at the right atrium. All indicated images were saved to PACS. CONTRAST: Contrast agent: None Contrast volume (mL): 0 RADIATION PARAMETERS: Fluoroscopy time (minutes): 0.6  Reference air kerma (mGy): 3.6 Darci Current, MD 12/28/2021 12:42 PM     IVs: Saline lock    Patient is presently participating in rehab.    Adjustment to Present Illness: patient is coping adequately    Activity Tolerance: fair    Special needs:  None      Current Functional Status  Weight-bearing status:  None    Mobility 01/03/22  Functional Mobility:  Supine to Sit: Stand by Assist  Scooting to EOB: Stand by Assist  Sit to Stand: Minimal Assist;Increased Time;Increased Effort;with instruction for hand placement to increase safety;bed elevated  Stand to Sit: Moderate Assist (2/2 uncontrolled descent)  Transfers  Bed to Chair: Minimal Assist  Device Used for Functional Transfer: front-wheeled walker  PMP - Progressive Mobility Protocol   PMP Activity: Step 6 - Walks in Room      Ambulation:  Ambulation: Minimal Assist;with front-wheeled walker  Pattern: shuffle;R foot decreased clearance;L foot decreased clearance;L genu recurvatum;decreased cadence;decreased step length;R genu recurvatum        Patient Participation: good  Patient Endurance: fair  Activities of Daily Living 01/03/22   Balance  Static Sitting: good  Dynamic Sitting: good  Static Standing: fair+ with BUE support, fair/fair- with fatigue  Dynamic Standing: fair with RW      Self Care and Home Management  Eating: independent  Grooming: initially CGA at sink for standing balance, required modification to complete from seated 2/2 decreased standing balance   Bathing: modA from seated   UE Dressing: SBA  LE Dressing: SBA adjusting socks from seated EOB  Toileting: maxA     Therapeutic Exercises/Activities  Incorporated into ADLs     Participation: good   Endurance: good  Cognition: Alert/oriented and Follows  commands  Cognitive deficits: None noted at this time  Communication deficits: None noted at this time  Swallowing deficits: No  Other impairments: None noted at this time    Healthcare Decisions:  Patient is able to understand and make healthcare decisions: Yes    Payer Source  Primary:  [facility] [employer]  Secondary:  Medicaid of [place]    Rehab risks/benefits reviewed: Yes  Patient/family/caregiver agrees/accepts rehab risks/benefits: Yes  Rehab literature/brochure provided: Yes     Is the patient being considered for an arthritic condition to meet the 60% regulatory requirement: No      Is the patient appropriate for IRF admission (able to tolerate intensive inpatient rehabilitation and has a condition that requires a comprehensive interdisciplinary team)?  Yes   Madylyn Insco condition is sufficiently stable to allow active participation in an intensive interdisciplinary inpatient rehabilitation program. Addelyn Alleman would benefit from interdisciplinary inpatient rehabilitation provided by a physician, rehab-focused nursing, and a minimum of two rehab therapies which will provide specialized care for the following functional deficits: metabolic function, mobility, nutrition, pain management, psychosocial, safety risk, self-care management, and skin/wound management    The recommended interdisciplinary team will be comprised of the following services: Medical Supervision, 24 Hour Rehabilitation Nursing, Physical Therapy, Occupational Therapy, and Therapeutic Recreation    Lonie Rummell is able to tolerate 3 hours of therapy 5 days a week.    Prognosis: fair    Level of expected improvement with inpatient rehabilitation stay: MI ambulation; MI ADL's    Estimated date of admission to inpatient rehabilitation: 01/03/22    Estimated length of inpatient rehabilitation stay in order to achieve rehab medical/functional goals: 10 Days    Anticipated destination post-discharge from inpatient rehabilitation: community discharge  with assistance; home health therapy and home health nursing

## 2021-12-30 NOTE — PT Progress Note (Signed)
Physical Therapy Note    Physical Therapy Treatment  [name]      Post Acute Care Therapy Recommendations:     Discharge Recommendations:  Acute Rehab    If Acute Rehab  recommended discharge disposition is not available, patient will need Mod assist for mobility and HHPT.     DME needs IF patient is discharging home: Wheelchair-manual, Front wheel walker    Therapy discharge recommendations may change with patient status.  Please refer to most recent note for up-to-date recommendations.    Patient anticipated to benefit from and to be able to engage in 3 hours of therapy a day for 5 days a week.      Assessment:   Significant Findings: None    Pt with continued improvement in B LE strength and trunk control, continuing to require CG/MinA for uspine to sit transition to EOB 2/2 proximal B LE weakness. Pt able to stand and ambulate short distance at RW with Min/ModA with increased reliance on UEs noted. Pt with narrow BOS, scissoring and genu recurvatum in stance with decreased B LE foot clearance during ambulation, minimally improved with visual input 2/2 diminished B LE sensation. Pt continues to present with decreased strength, sensation, impaired coordination and balance deficits and would continue to benefit from skilled PT to maximize functional mobility and independence.     Assessment: Decreased UE strength, Decreased LE strength, Decreased safety/judgement during functional mobility, Decreased endurance/activity tolerance, Impaired coordination, Decreased sensation, Impaired motor control, Decreased functional mobility, Decreased balance, Gait impairment  Progress: Progressing toward goals  Prognosis: Good, With continued PT status post acute discharge  Risks/Benefits/POC Discussed with Pt/Family: With patient  Patient left without needs and call bell within reach. RN notified of session outcome.     Treatment Activities: Neuro re-ed, Gait training, and Therex    Educated the patient to role of physical  therapy, plan of care, goals of therapy and HEP, safety with mobility and ADLs, energy conservation techniques.    Plan:   Treatment/Interventions: Exercise, Neuromuscular re-education, Functional transfer training, Gait training, LE strengthening/ROM, Endurance training, Cognitive reorientation, Patient/family training, Equipment eval/education, Bed mobility, Compensatory technique education        PT Frequency: 4-5x/wk     Continue plan of care.    Unit: [facility] TOWER 7  Bed: F745/F745.01     Precautions and Contraindications:   Precautions  Other Precautions: falls, decreased B LE strength/sensation    Updated Medical Status/Imaging/Labs:   MRI Femur Right W WO Contrast    Result Date: 12/30/2021  1. Mild edema and atrophy involving the semimembranosus and biceps femoris muscles of the right and left thighs, noted to be bilateral and symmetric in appearance. Mild enhancement on the postcontrast images. Very mild edema in the anterior compartment muscles of the right and left thighs, with mild enhancement. Mild atrophy of the semimembranosus and biceps femoris muscles bilaterally. These findings are nonspecific, and may be seen in the setting of muscle denervation or myositis. 2. No atrophy involving the anterior compartment muscles. 3. Mild edema in the proximal adductor longus and adductor magnus muscles bilaterally. 4. No areas of nonenhancement to suggest muscle necrosis. [name] Birks, MD 12/30/2021 3:22 PM    MRI Femur Left W WO Contrast    Result Date: 12/30/2021  1. Mild edema and atrophy involving the semimembranosus and biceps femoris muscles of the right and left thighs, noted to be bilateral and symmetric in appearance. Mild enhancement on the postcontrast images. Very mild  edema in the anterior compartment muscles of the right and left thighs, with mild enhancement. Mild atrophy of the semimembranosus and biceps femoris muscles bilaterally. These findings are nonspecific, and may be  seen in the setting of muscle denervation or myositis. 2. No atrophy involving the anterior compartment muscles. 3. Mild edema in the proximal adductor longus and adductor magnus muscles bilaterally. 4. No areas of nonenhancement to suggest muscle necrosis. [name] Birks, MD 12/30/2021 3:22 PM    Subjective:   Patient Goal: not stated    "I can try."  Pain Assessment  Pain Assessment: No/denies pain    Patient's medical condition is appropriate for Physical Therapy intervention at this time.  Patient is agreeable to participation in the therapy session. Nursing clears patient for therapy.    Objective:   Observation of Patient/Vital Signs:  Patient is in bed with telemetry in place.  Pt wore mask during therapy session:No      Cognition/Neuro Status  Arousal/Alertness: Appropriate responses to stimuli  Attention Span: Appears intact  Orientation Level: Oriented X4  Memory: Appears intact  Following Commands: Follows all commands and directions without difficulty  Safety Awareness: minimal verbal instruction  Insights: Fully aware of deficits;Educated in safety awareness  Problem Solving: Assistance required to identify errors made;Assistance required to generate solutions;Assistance required to implement solutions;minimal assistance  Behavior: calm;cooperative  Motor Planning: ataxia       Functional Mobility:  Supine to Sit: Minimal Assist;Contact Guard Assist  Scooting to EOB: Contact Guard Assist  Sit to Stand: Minimal Assist;Moderate Assist  Stand to Sit: Moderate Assist (VCs for hand placement and controlled descent)  Transfers  Bed to Chair: Minimal Assist;Moderate Assist  Device Used for Functional Transfer: front-wheeled walker  PMP - Progressive Mobility Protocol   PMP Activity: Step 6 - Walks in Room  Distance Walked (ft) (Step 6,7): 10 Feet     Ambulation:  Ambulation: Minimal Assist;Moderate Assist;with front-wheeled walker  Pattern: shuffle;R foot decreased clearance;L foot decreased clearance;decreased  cadence;decreased step length;R genu recurvatum;L genu recurvatum;R foot drop;L foot drop;scissoring;Narrow BOS      Patient Participation: good  Patient Endurance: good    Patient left with call bell within reach, all needs met and all questions answered. RN notified of session outcome and patient response.     SCDs: in place  Fall mat: in place  Bed alarm: n/a  Chair alarm: on, responder 5 on    Goals:  Goals  Goal Formulation: With patient  Time for Goal Acheivement: 10 visits  Goals: Select goal  Pt Will Roll Left: independent  Pt Will Roll Right: independent  Pt Will Go Supine To Sit: with stand by assist  Pt Will Perform Sit To Supine: with minimal assist (for LEs)  Pt Will Sit Edge of Bed: 11-15 min, modified independent  Pt Will Perform Sit to Stand: with minimal assist  Pt Will Transfer Bed/Chair: with rolling walker, with moderate assist  Pt Will Ambulate: 1-10 feet, with rolling walker, with moderate assist  Pt Will Propel Wheelchair: 51-150 feet, with stand by assist  Pt Will Demo / Request Pressure Relief: modified independent    Time of Treatment  PT Received On: 12/30/21  Start Time: 1230  Stop Time: 1305  Time Calculation (min): 35 min  Treatment # 4 out of 10 visits    PPE worn during session: procedural mask and gloves    Tech present: n/a - Session overlap with OT for safety and to maximize therapeutic benefit.  PPE worn  by tech: N/A    Clent Jacks PT, DPT  Pager [phone]

## 2021-12-30 NOTE — Plan of Care (Signed)
Problem: Moderate/High Fall Risk Score >5  Goal: Patient will remain free of falls  Outcome: Progressing  Flowsheets (Taken 12/29/2021 0800 by Orrin Brigham, RN)  High (Greater than 13):   HIGH-Visual cue at entrance to patient's room   HIGH-Bed alarm on at all times while patient in bed   HIGH-Utilize chair pad alarm for patient while in the chair   HIGH-Apply yellow "Fall Risk" arm band   HIGH-Pharmacy to initiate evaluation and intervention per protocol   HIGH-Initiate use of floor mats as appropriate   HIGH-Consider use of low bed     Problem: Compromised Tissue integrity  Goal: Damaged tissue is healing and protected  Outcome: Progressing  Flowsheets (Taken 12/28/2021 1502 by Henrine Screws, RN)  Damaged tissue is healing and protected:   Monitor/assess Braden scale every shift   Provide wound care per wound care algorithm   Reposition patient every 2 hours and as needed unless able to reposition self   Increase activity as tolerated/progressive mobility   Avoid shearing injuries   Relieve pressure to bony prominences for patients at moderate and high risk   Use bath wipes, not soap and water, for daily bathing   Keep intact skin clean and dry   Use incontinence wipes for cleaning urine, stool and caustic drainage. Foley care as needed   Monitor external devices/tubes for correct placement to prevent pressure, friction and shearing   Utilize specialty bed  Goal: Nutritional status is improving  Outcome: Progressing  Flowsheets (Taken 12/30/2021 0137)  Nutritional status is improving: Allow adequate time for meals     Problem: Bladder/Voiding  Goal: Perineal skin integrity is maintained or improved  Outcome: Progressing  Flowsheets (Taken 12/27/2021 1334 by Henrine Screws, RN)  Perineal skin integrity is maintained or improved:   Keep intact skin clean and dry   Use protective skin barriers to decrease potential skin breakdown   Consult/collaborate with Wound Care Nurse  Goal: Patient will experience proper bladder  emptying during admission  Outcome: Progressing  Flowsheets (Taken 12/28/2021 2051)  Patient will experience proper bladder emptying during admission:   Monitor intake and output   Encourage patient to empty bladder at regular intervals     Problem: Neurological Deficit  Goal: Neurological status is stable or improving  Outcome: Progressing  Flowsheets (Taken 12/27/2021 1334 by Henrine Screws, RN)  Neurological status is stable or improving: Monitor/assess/document neurological assessment (Stroke: every 4 hours)     Problem: Peripheral Neurovascular Impairment  Goal: Extremity color, movement, sensation are maintained or improved  Outcome: Progressing  Flowsheets (Taken 12/28/2021 0421 by Roanna Epley, RN)  Extremity color, movement, sensation are maintained or improved:   Increase mobility as tolerated/progressive mobility   Assess and monitor application of corrective devices (cast, brace, splint), check skin integrity   Assess extremity for proper alignment     Problem: Compromised Hemodynamic Status  Goal: Vital signs and fluid balance maintained/improved  Outcome: Progressing  Flowsheets (Taken 12/28/2021 0421 by Roanna Epley, RN)  Vital signs and fluid balance are maintained/improved:   Position patient for maximum circulation/cardiac output   Monitor/assess vitals and hemodynamic parameters with position changes   Monitor and compare daily weight   Monitor intake and output. Notify LIP if urine output is less than 30 mL/hour.   Monitor/assess lab values and report abnormal values     Problem: Nutrition  Goal: Nutritional intake is adequate  Outcome: Progressing  Flowsheets (Taken 12/28/2021 0421 by Roanna Epley, RN)  Nutritional intake is adequate:  Monitor daily weights   Assist patient with meals/food selection   Allow adequate time for meals     Problem: Pain interferes with ability to perform ADL  Goal: Pain at adequate level as identified by patient  Outcome: Progressing  Flowsheets (Taken 12/28/2021 1502 by  Henrine Screws, RN)  Pain at adequate level as identified by patient:   Identify patient comfort function goal   Assess for risk of opioid induced respiratory depression, including snoring/sleep apnea. Alert healthcare team of risk factors identified.   Assess pain on admission, during daily assessment and/or before any "as needed" intervention(s)   Reassess pain within 30-60 minutes of any procedure/intervention, per Pain Assessment, Intervention, Reassessment (AIR) Cycle   Evaluate if patient comfort function goal is met   Evaluate patient's satisfaction with pain management progress   Offer non-pharmacological pain management interventions     Problem: Side Effects from Pain Analgesia  Goal: Patient will experience minimal side effects of analgesic therapy  Outcome: Progressing  Flowsheets (Taken 12/28/2021 1502 by Henrine Screws, RN)  Patient will experience minimal side effects of analgesic therapy: Monitor/assess patient's respiratory status (RR depth, effort, breath sounds)

## 2021-12-30 NOTE — Progress Notes (Signed)
CASE MANAGEMENT PROGRESS NOTE      Date Time: 12/30/21 2:31 PM  Patient Name: [name]  Attending Physician: Frederich Cha, DO  Hospital Day: 5    Date of Admission:  12/25/2021    Reason for Admission:  Weakness of both legs [R29.898]    CM f/u with pt, she requested that I discuss [place] choice with daughter. I spoke with the pts daughter [name], she is requesting [place], no preference for location.  Will update [place] [facility] Liaison, to start insurance auth.    DISPO: [place]    CM following for hospital course outcomes, Interdisciplinary Team recommendations, discharge readiness and potential barriers to discharge.      Gabriel Carina, MSN RN  Case Management  [employer]  [phone]

## 2021-12-30 NOTE — Progress Notes (Signed)
[facility]   [facility] PROGRESS NOTE    Today's date & time: 12/30/21 5:50 PM  Patient Name: [name],[name]  Attending Physician: [name], [name] Pallone, DO  Admission Date: 12/25/2021    Assessment:     Active Hospital Problems    Diagnosis    Hypokalemia    Thoracic back pain    Gastroparesis    Weakness of both legs    Guillain Barr syndrome    Type 2 diabetes mellitus    Paroxysmal atrial fibrillation     Plan:   Difficulty walking and bilateral leg and hand weakness - likely from statin induced myositis vs inflammatory myositis vs immune mediated myostitis. patient to has multiple vague complaints who was transferred from outside hospital for consideration of EMG for [name] AweGilliam Barr consideration, and possible IVIG, currently seen by neurology team here and based on her history started the patient on thiamine IV, at this time we will continue to perform EMG, possible IVIG, LP was done and came back unremarkable, got seen by PT and OT team who recommended going to acute rehab, fall precaution, continue monitoring.  Patient had NIF and VC done on 3/5 which showed low volume.  Patient continued proximal muscle weakness, areflexia, highly suspicious for GBS and neurology team started the patient on IVIG on 12/27/2021, after the start of IVIG within the first 10 minutes patient developed numbness on the upper lip on the right side and tongue numbness, was that it was discontinued and was placed as allergy, following that IgG antibodies were ordered, IVIG antibodies ordered, stroke team recommended placing dialysis catheter and doing plasma exchange, pathology team was contacted, patient says every day she is feeling better, currently awaiting the start of plasma exchange.  Patient also had a EMG done on 12/27/2021, based on that CT scan of the abdomen and pelvis was done which showed no hematoma.   MRI L and R thigh or femur W WO   Mild edema and atrophy involving the semimembranosus and biceps  femoris muscles of  the right and left thighs, noted to be bilateral and  symmetric in appearance. These findings are nonspecific, and may be seen in the setting of muscle denervation or myositis.  Mild edema in the proximal adductor longus and adductor magnus muscles bilaterally.   No areas of nonenhancement to suggest muscle necrosis.  LDH, CPK, LFTs, aldolase, viral hepatitis panel pending  CPK now improved to 154  Appreciate input from neurology team.   Vitamin D deficiency - repleted.   Zinc deficiency - repleted  B6 deficiency - repleted  Upper abdominal and mid back pain - patient complaining of having some pain in the epigastric area banding leg to the mid back, she is placed on PPI, Tums, muscle relaxants, as needed pain medications, may consider placing the patient on scheduled Tylenol, will continue monitoring.  Pain is getting better by the day .   Poor appetite/malnourished - nutrition consult.  CT Chest/Abd/Pelvis - no malignancy noted.    Urinary retention - tamsulosin 0.4mg  po daily.    Paroxysmal atrial fibrillation - rate is controlled, patient is on apixaban.  Metoprolol.   Hypertension - blood pressure is controlled, continue monitoring.   COPD - noted, patient is getting Symbicort, in no acute respiratory distress, on oxygen.   Type 2 diabetes mellitus - borderline hemoglobin A1c in the past 2020 at 6.0, currently no treatment, hemoglobin A1c 5.3%.   Hypokalemia - replaced, monitor closely, magnesium level is also lower, replaced magnesium, now  started BID potassium ordered.   Anemia - most likely poor nutrition and chronic disease, will check iron profile, consider iron infusion.  Multivitamins added.   Bipolar disorder - patient seem to have some issues with behavior, patient is on nortriptyline and Latuda, no acute exacerbation, continue monitoring.   Constipation - patient said last bowel meant a few days ago, stool softeners and laxatives given, had multiple bowel movements on the evening of 12/27/2021,  continue monitoring    GI prophylaxis: Protonix, Maalox    Spoke to patient's daughter over the phone and updated her about patient's condition    Subjective     CC: Weakness of both legs    Interval History/24 hour events: Patient say every day she is getting better, no shortness of breath, no chest pain, back pain seem to be getting better    HPI: HPI per Admitting Provider   " [name] is a 66 y.o. female hx afib on Eliquis, HTN, HLD, DM, COPD, TIAs, COPD, bipolar disorder, [facility] admit 2/7-2/17/23 for rhabdomyolysis (peak CK 4464 - statin d/c'ed) + vomiting ?gastroparesis + starvation ketoacidosis + UTI, [facility] admit 2/18-2/20/23 for rhabdomyolysis (peak CK 2509) + transaminitis + bilateral leg weakness + right paresthesias, [facility] ER visit 3/2 for bilateral leg weakness who presents to [facility] with difficulty standing and walking, bilateral leg/hand weakness, bilateral leg/hand/face paresthesias. She noted bilateral leg weakness with her prior [facility] admission 2/7-2/17 and was readmitted for bilateral leg weakness 2/18-2/20. The past 4 days she has had difficulty standing and walking. The past 4 days she notes new bilateral hand and face paresthesias and bilateral hand weakness. She has some shortness of breath but is speaking in full sentences. She went to [facility] 3/2 and was discharged. She saw her PMD who advised she go to an [facility]. At [facility] she got MRI brain and CTL-spine which did not show a cause for her symptoms. [facility] called neurology Dr. Francesco [name] who said to consider IVIG for 5 days, LP, EMG. The LP was done at [facility]. Dr. Francesco [name] advised transfer to [facility]. These symptoms are sudden onset, moderate intensity, without alleviating factors. "     Hospital course:   Following admission, patient was seen by neurology consult who performed MRI of the entire spine and brain, without contrast which came back to show no major issues except on the lumbar area L4-L5 disc disease with facet arthrosis and  degenerative changes, neurology team recommended LP and CSF studies so far are unrevealing, patient continued to complain of having bandlike pain around the epigastric area & in the back, there was tenderness on the right side of the thoracic vertebral area in the muscle, Lidoderm patch applied, patient is getting Protonix and Maalox, has not had a bowel movement for that reason given lactulose and stool softeners.  Patient was [facility] for consideration of IVIG with a suspicion of GBS, EMG currently pending.     Due to her history of not eating well and has been losing weight over the past few weeks, neurology team felt may be nutritional and started on on IV thiamine, patient has been requesting narcotics for the pain that she is got but not really having any tenderness on examination.  Patient did NIF and vital capacity on the morning of 12/26/2021 which came back -12 cm H2O and vital capacity 1.5 L.  Patient is not in any respiratory distress, not needing any oxygen at this time.  Patient had additional laboratory studies  including HIV and syphilis which came back negative, negative for COVID 19, potassium was noted to be low for which replacement was given, CPK slightly elevated at 239.  Cultures remain negative.     Patient was seen by PT and OT team who recommended going to acute rehab.  Patient was having constipation, stool softeners and laxatives given.     3/8: Pt feels better.  Diffuse pain improved.  LE weakness improved.    3/9: Patient seen and examined she was noted to have abdominal distention.  Patient reports poor appetite over the past 2 weeks.  She denies any history of bariatric surgery or malabsorption disorder.    Review of Systems:   General - back pain   Resp - no SOB   CVS - no CP, no CAD   GI - no nausea, no diarrhea   CNS - no visual deficit, generalized  weakness    Physical Exam:     Vitals:    12/30/21 0340 12/30/21 0801 12/30/21 1155 12/30/21 1619   BP: 126/75  126/75 122/78 117/65   Pulse: (!) 120 (!) 118 (!) 117 (!) 111   Resp: 17 18 18 18    Temp: 98.7 F (37.1 C) 97.9 F (36.6 C) 98.4 F (36.9 C) 97.9 F (36.6 C)   TempSrc: Oral Oral Oral Oral   SpO2: 100% 100% 100% 100%   Weight:       Height:            No intake or output data in the 24 hours ending 12/30/21 1750    General: Awake, alert, oriented x 3; no acute distress.  HEENT: PERRLA, eomi, sclera anicteric, oropharynx clear without lesions, mucous membranes moist  Neck: supple, no lymphadenopathy, no thyromegaly, no JVD, no carotid bruits  Cardiovascular: regular rate and rhythm, no murmurs, rubs or gallops  Lungs: clear to auscultation bilaterally, without wheezing, rhonchi, or rales  Abdomen: soft, non-tender, suprapubic area distended; no palpable masses, no hepatosplenomegaly, normoactive bowel sounds, no rebound or guarding  Extremities: no clubbing, cyanosis, or edema  Neuro: cranial nerves grossly intact, strength 5/5 in upper extremities except 3+/5 hand intrinsics, bilateral hip extension 2/5, bilateral knee extension 3/5, bilateral dorsiflexion 3/5, bilateral plantarflexion 4/5, paresthesias bilateral feet/hands/face but intact to light touch, could not elicit knee DTRs, follows commands.   Skin: no rashes or lesions noted  GU: no CVA tenderness     Meds:   Medications were reviewed by me:  Current Facility-Administered Medications   Medication Dose Route Frequency    acetaminophen  650 mg Oral Daily    amLODIPine  2.5 mg Oral Daily    apixaban  5 mg Oral Q12H SCH    bisacodyl  5 mg Oral Daily    budesonide-formoterol  2 puff Inhalation BID    diphenhydrAMINE  25 mg Oral Daily    lidocaine  1 patch Transdermal Q24H    lurasidone  120 mg Oral Daily    metoprolol succinate XL  25 mg Oral Daily    nortriptyline  10 mg Oral QHS    pantoprazole  40 mg Oral Daily    potassium chloride  40 mEq Oral BID    rOPINIRole  1 mg Oral QHS    terazosin  2 mg Oral QHS    [START ON 12/31/2021] thiamine  100 mg Oral  Daily    vitamin B-6  50 mg Oral Daily    vitamin D (cholecalciferol)  25 mcg Oral Daily  vitamins/minerals  1 tablet Oral Daily    zinc sulfate  220 mg Oral Daily       Labs:   Labs reviewed personally include:  Recent Labs   Lab 12/28/21  0520 12/27/21  0738   WBC 5.88 5.06   Hgb 8.8* 7.5*   Hematocrit 27.6* 23.7*   Platelets 403* 327    Recent Labs   Lab 12/26/21  0536   PT 13.1*   PT INR 1.1   PTT 27       Recent Labs   Lab 12/28/21  0520 12/27/21  0738   Sodium 140 137   Potassium 3.7 3.2*   Chloride 106 103   CO2 23 22   BUN 4.0* 5.0*   Creatinine 0.6 0.6   EGFR >60.0 >60.0   Glucose 89 81   Calcium 9.4 9.0    Recent Labs   Lab 12/24/21  1838   Alkaline Phosphatase 68   Bilirubin, Total 0.4   Protein, Total 7.1   Albumin 3.6   ALT 67*   AST (SGOT) 35            Microbiology Results (last 15 days)       Procedure Component Value Units Date/Time    Culture + Gram Stain,Aerobic, CSF [[id number]] Collected: 12/24/21 2232    Order Status: Completed Specimen: Cerebrospinal Fluid from CSF (Lumbar Puncture Spinal Fluid) Updated: 12/30/21 1508    Narrative:      ORDER#: [id number]                                    ORDERED BY: PATITUCCI, LAUR  SOURCE: CSF (Lumbar Puncture Spinal Fluid) lumbar spiCOLLECTED:  12/24/21 22:32  ANTIBIOTICS AT COLL.:                                RECEIVED :  12/27/21 18:51  Stain, Gram                                FINAL       12/27/21 20:56  12/27/21   No WBCs seen             No organisms seen             Stain performed on Cytospin (concentrated) specimen  Culture and Gram Stain, Aerobic, CSF       PRELIM      12/30/21 15:08  12/28/21   Culture no growth to date, Final report to follow  12/29/21   Culture no growth to date, Final report to follow  12/30/21   Culture no growth to date, Final report to follow      CSF Meningitis/Encephalitis Pathogen Panel PCR [[id number]] Collected: 12/24/21 2232    Order Status: Completed Specimen: CSF (Lumbar Puncture Spinal Fluid) Updated: 12/25/21  1110     CSF Eschericia coli K1 by PCR Not Detected     CSF Haemophilus influenza by PCR Not Detected     CSF Listeria monocytogenes by PCR Not Detected     CSF Neisseria meningitidis (encapsulated) by PCR Not Detected     CSF Streptococcus agalactiae by PCR Not Detected     CSF Streptococcus pneumoniae by PCR Not Detected     CSF Cytomegalovirus by PCR Not Detected  CSF Enterovirus by PCR Not Detected     CSF Herpes simplex virus 1 by PCR Not Detected     CSF Herpes simplex virus 2 by PCR Not Detected     CSF Human herpesvirus 6 by PCR Not Detected     CSF Human parechovirus by PCR Not Detected     CSF Varicella zoster virus by PCR Not Detected     CSF Cryptococcus neoformans/gattii by PCR Not Detected     Comment: Test performed with the FilmArray Meningitis/Encephalitis (ME)  PCR Panel. A negative result does not rule out central nervous  system infection and results from this panel are not intended  to be used as the sole basis for diagnosis, treatment, or  other patient management decisions. Consider pathogen specific  testing if FilmArray ME is negative but clinical suspicion is  high for a particular infection. False negative results could  be due to the presence of strains with genetic variability in  the target regions,concentration of pathogen nucleic acid below  the limit of detection, or improper specimen handling.  This test has not been specifically evaluated with CSF from  immunocompromised patients and this test may be affected by  concurrent antimicrobial therapy. Positive results may be due  to detection of non-viable organism. Clinical correlation is  required. False positives may result from contamination during  specimen collection or processing. This test should not be  used for monitoring treatment of infection.         Narrative:      Indication for Meningitis/Encephalitis Panel PCR  Testing:->Very high clinical concern for infectious  encephalitis    COVID-19 (SARS-CoV-2) only ([facility]  Rapid) asymptomatic admission [[id number]] Collected: 12/24/21 2057    Order Status: Completed Specimen: Nasopharyngeal Updated: 12/24/21 2136     Purpose of COVID testing Screening     SARS-CoV-2 Specimen Source Nasal Swab     SARS CoV 2 Overall Result Not Detected     Comment: __________________________________________________  -A result of "Detected" indicates POSITIVE for the    presence of SARS CoV-2 RNA  -A result of "Not Detected" indicates NEGATIVE for the    presence of SARS CoV-2 RNA  __________________________________________________________  Test performed using the Roche cobas Liat SARS-CoV-2 assay. This assay is  only for use under the Food and Drug Administration's Emergency Use  Authorization. This is a real-time RT-PCR assay for the qualitative  detection of SARS-CoV-2 RNA. Viral nucleic acids may persist in vivo,  independent of viability. Detection of viral nucleic acid does not imply the  presence of infectious virus, or that virus nucleic acid is the cause of  clinical symptoms. Negative results do not preclude SARS-CoV-2 infection and  should not be used as the sole basis for diagnosis, treatment or other  patient management decisions. Negative results must be combined with  clinical observations, patient history, and/or epidemiological information.  Invalid results may be due to inhibiting substances in the specimen and  recollection should occur. Please see Fact Sheets for patients and providers  located:  [email or web address].no         Narrative:      o Collect and clearly label specimen type:  o PREFERRED-Upper respiratory specimen: One Nasal Swab in  Transport Media.  o Hand deliver to laboratory ASAP  Indication for testing->Extended care facility admission to  semi private room  Screening            Imaging personally reviewed, including:  Radiology Results (24 Hour)  Procedure Component Value Units Date/Time    MRI Femur Left W WO Contrast [[id number]]  Collected: 12/30/21 1406    Order Status: Completed Updated: 12/30/21 1524    Narrative:      HISTORY: Pain in the right femur; pain in the left femur; evaluate for  necrotizing or nonnecrotizing myopathy; history of recent  rhabdomyolysis; weakness likely secondary to myopathy of unknown  etiology    COMPARISON: None.    TECHNIQUE: MRI of the right femur performed on a 1.5T scanner without  and with 15 mL of Clariscan intravenous contrast.     MRI of the left femur performed on a 1.5 T scanner without and with 15  mL of Clariscan intravenous contrast.    FINDINGS:  Bone: The bone and bone marrow are normal. There is no evidence of  fracture. No abnormal marrow or periosteal edema is seen.    Muscles/Tendons: There is mild edema noted in the posterior compartment  muscles, including within the biceps femoris muscle and semimembranosus  muscle, noted to be bilateral and symmetric in appearance. There is mild  atrophy of the semimembranosus muscle and biceps femoris muscles.    Mild symmetric edema is noted in the adductor longus and proximal  adductor magnus muscles bilaterally adjacent to the level of the  symphysis pubis.    There is suggestion of very mild edema noted within the anterior  compartment muscles bilaterally, slightly hyperintense relative to the  bursal/sartorius muscles. Subtle enhancement is seen on the postcontrast  images.    On the postcontrast images, there is mild enhancement in the region of  muscle edema in the region of the semimembranosus and biceps femoris  muscles bilaterally.    Other: No mass lesion or fluid collection is identified.      Impression:          1. Mild edema and atrophy involving the semimembranosus and biceps  femoris muscles of the right and left thighs, noted to be bilateral and  symmetric in appearance. Mild enhancement on the postcontrast images.  Very mild edema in the anterior compartment muscles of the right and  left thighs, with mild enhancement. Mild atrophy of  the semimembranosus  and biceps femoris muscles bilaterally.    These findings are nonspecific, and may be seen in the setting of muscle  denervation or myositis.    2. No atrophy involving the anterior compartment muscles.    3. Mild edema in the proximal adductor longus and adductor magnus  muscles bilaterally.     4. No areas of nonenhancement to suggest muscle necrosis.    [name] Birks, MD  12/30/2021 3:22 PM    MRI Femur Right W WO Contrast [[id number]] Collected: 12/30/21 1406    Order Status: Completed Updated: 12/30/21 1524    Narrative:      HISTORY: Pain in the right femur; pain in the left femur; evaluate for  necrotizing or nonnecrotizing myopathy; history of recent  rhabdomyolysis; weakness likely secondary to myopathy of unknown  etiology    COMPARISON: None.    TECHNIQUE: MRI of the right femur performed on a 1.5T scanner without  and with 15 mL of Clariscan intravenous contrast.     MRI of the left femur performed on a 1.5 T scanner without and with 15  mL of Clariscan intravenous contrast.    FINDINGS:  Bone: The bone and bone marrow are normal. There is no evidence of  fracture. No abnormal marrow or periosteal edema is seen.  Muscles/Tendons: There is mild edema noted in the posterior compartment  muscles, including within the biceps femoris muscle and semimembranosus  muscle, noted to be bilateral and symmetric in appearance. There is mild  atrophy of the semimembranosus muscle and biceps femoris muscles.    Mild symmetric edema is noted in the adductor longus and proximal  adductor magnus muscles bilaterally adjacent to the level of the  symphysis pubis.    There is suggestion of very mild edema noted within the anterior  compartment muscles bilaterally, slightly hyperintense relative to the  bursal/sartorius muscles. Subtle enhancement is seen on the postcontrast  images.    On the postcontrast images, there is mild enhancement in the region of  muscle edema in the region of the semimembranosus  and biceps femoris  muscles bilaterally.    Other: No mass lesion or fluid collection is identified.      Impression:          1. Mild edema and atrophy involving the semimembranosus and biceps  femoris muscles of the right and left thighs, noted to be bilateral and  symmetric in appearance. Mild enhancement on the postcontrast images.  Very mild edema in the anterior compartment muscles of the right and  left thighs, with mild enhancement. Mild atrophy of the semimembranosus  and biceps femoris muscles bilaterally.    These findings are nonspecific, and may be seen in the setting of muscle  denervation or myositis.    2. No atrophy involving the anterior compartment muscles.    3. Mild edema in the proximal adductor longus and adductor magnus  muscles bilaterally.     4. No areas of nonenhancement to suggest muscle necrosis.    [name] Birks, MD  12/30/2021 3:22 PM            Safety Checklist:     DVT prophylaxis:  CHEST guideline (See page [id number]) Chemical and Mechanical   Foley:  [facility]ay Rn Foley protocol Not present   IVs:  Peripheral IV   PT/OT: Ordered   Daily CBC & or Chem ordered:  SHM/ABIM guidelines (see #5) Yes, due to clinical and lab instability   Reference for approximate charges of common labs: CBC auto diff - $76  BMP - $99  Mg - $79    Lines:          Patient Lines/Drains/Airways Status       Active PICC Line / CVC Line / PIV Line / Drain / Airway / Intraosseous Line / Epidural Line / ART Line / Line / Wound / Pressure Ulcer / NG/OG Tube       Name Placement date Placement time Site Days    Peripheral IV 12/25/21 20 G Standard Right Antecubital 12/25/21  0600  Antecubital  1                     Disposition:   Today's date: 12/30/2021  Length of Stay: 5  Anticipated medical stability for discharge: March,  8 - Afternoon  Reason for ongoing hospitalization: Generalized weakness  Anticipated discharge needs: Acute rehab    Signed by: [name] Cha, DO

## 2021-12-30 NOTE — UM Notes (Signed)
PATIENT NAME: [name],[name]   DOB: 01/06/1956   PMH:  has a past medical history of Anemia, Asthma, Atrial fibrillation, Chronic obstructive pulmonary disease, Convulsions, Depression, Diabetes mellitus, Hyperlipidemia, Hypertension, and TIA (transient ischemic attack).  PSH:  has a past surgical history that includes Hysterectomy; pci; and Cardiac catheterization (2017).     Continued Stay Review  Identified [place] barriers:   PT/OT Discharge Recommendation: Acute Rehab  _____________________  CSR Date: 12/30/21, Unit: [facility] 7    Subjective: OT recommending acute rehab  No documented events overnight  Per neurology note dated 03/08: Reports she is feeling better, "improved strength"  Sensation is improved    VS:   I&O   Temp:  [97.9 F (36.6 C)-98.7 F (37.1 C)]   Heart Rate:  [110-121]   Resp Rate:  [16-18]   BP: (122-181)/(73-99)   SpO2:  [99 %-100 %]   I/O last 3 completed shifts:  In: 240 [P.O.:240]  Out: -         Abnormal Labs: n/a    Diagnostics: MRI right and left femur pending results    Medications:   Current Facility-Administered Medications   Medication Dose Route Frequency    acetaminophen  650 mg Oral Daily    amLODIPine  2.5 mg Oral Daily    apixaban  5 mg Oral Q12H SCH    bisacodyl  5 mg Oral Daily    budesonide-formoterol  2 puff Inhalation BID    diphenhydrAMINE  25 mg Oral Daily    lidocaine  1 patch Transdermal Q24H    lurasidone  120 mg Oral Daily    metoprolol succinate XL  25 mg Oral Daily    nortriptyline  10 mg Oral QHS    pantoprazole  40 mg Oral Daily    potassium chloride  40 mEq Oral BID    rOPINIRole  1 mg Oral QHS    terazosin  2 mg Oral QHS    [START ON 12/31/2021] thiamine  100 mg Oral Daily    vitamin B-6  50 mg Oral Daily    vitamin D (cholecalciferol)  25 mcg Oral Daily    vitamins/minerals  1 tablet Oral Daily    zinc sulfate  220 mg Oral Daily       IV PRN meds received within last 24 hrs:  Ativan IV x 1    Assessment/Plan: per MD note dated 03/08: -  Medical management per primary team  - MRI femur bilaterally ordered  - Follow-up pending labs including encephalopathy autoimmune eval, ganglioside ab panel, anti IgA ab test, myositis ab panel, MG panel, paraneoplastic panel, motor and sensory neuropathy eval, B6, B1, final CSF culture  - Continue high dose Thiamine treatment  - Thiamine 500 mg IV q8h x 3 days followed by 250 mg IV daily x 3 days followed by 100 mg po daily.   -PT/OT  03/09: awaiting MD visit        Primary Payor: MEDICARE MCO / Plan: AN[employer] MEDIBLUE DUAL [employer] SNP / Product Type: MANAGED MEDICARE /      UTILIZATION REVIEW CONTACT: Name: Bobette Mo. Karel Jarvis RN BSN  Clinical Case Manager  - Utilization Review  [facility]  Address:  [address] [place], [place]  [address]  NPI:   [id number]  Tax ID:  [id number]  Phone: [phone]  Fax: [phone]    Please use fax number [phone] to provide authorization for hospital services or to request additional information.  NOTES TO REVIEWER:    This clinical review is based on/compiled from documentation provided by the treatment team within the patient's medical record.

## 2021-12-30 NOTE — OT Progress Note (Signed)
Occupational Therapy Treatment  [name]        Post Acute Care Therapy Recommendations:     Discharge Recommendations:  Acute Rehab    If Acute Rehab  recommended discharge disposition is not available, patient will need max assist for ADLs/transfers and HHOT.     DME needs IF patient is discharging [facility]    Therapy discharge recommendations may change with patient status.  Please refer to most recent note for up-to-date recommendations.    Patient anticipated to benefit from and to be able to engage in 3 hours of therapy a day for 5 days a week.     Assessment:   Significant Findings: None    Pt received in the bed, agreeable to OT treatment session. Pt making good progress towards goals. Pt able to progress to walking short distance to sink in order to complete grooming tasks from standing level. Pt able to tolerate standing for short periods but benefits from seated rest breaks throughout task. Pt noted with decreasing balance/posture with fatigue, frequent VC to keep hips extended and widen BOS for improved standing balance with fair carryover. Continues to require cues/education on using visual compensation 2/2 impaired sensation/proprioception in BLE, noted with scissoring/ataxic step pattern when taking steps. Pt left in the chair with all needs in reach, all questions answered. Will continue to benefit from skilled OT to maximize functional independence and address deficits.      Treatment Activities: bed mobility, transfer training, functional mobility, ADL, education    Educated the patient to role of occupational therapy, plan of care, goals of therapy and HEP, safety with mobility and ADLs, home safety.    Plan:    OT Frequency Recommended: 4-5x/wk     Continue plan of care.    [facility]  Bed: F745/F745.01       Precautions and Contraindications:   Falls    Updated Medical Status/Imaging/Labs:  Reviewed     Subjective: "it feels  good to walk"   Patient's medical condition is appropriate for Occupational Therapy intervention at this time.  Patient is agreeable to participation in the therapy session. Nursing clears patient for therapy.    Pain:   Denies     Objective:   Patient is in bed with PIV access, external catheter in place.  Pt wore mask during therapy session:No      Cognition  A/Ox3  Answers questions appropriately: yes  Command Following: good     Functional Mobility  Rolling: SBA  Supine to Sit: minA   Sit to Stand: minA with RW, modA with fatigue   Transfers: min-modA with RW  Functional Mobility: min-modA with RW, ataxic, scissoring, needs cues to use visual compensation for step pattern     PMP Activity: Step 6 - Walks in Room    Balance  Static Sitting: good  Dynamic Sitting: good  Static Standing: fair+ with BUE support, fair/fair- with fatigue  Dynamic Standing: fair with RW     Self Care and Home Management  Eating: independent  Grooming: min-modA standing at sink; needs seated rest breaks, difficulty maintaining upright posture at sink for prolonged periods   Bathing: modA from seated   UE Dressing: SBA  LE Dressing: SBA adjusting socks from seated EOB  Toileting: maxA    Therapeutic Exercises/Activities  Incorporated into ADLs    Participation: good   Endurance: good    Patient left with call bell within reach, all needs  met, SCDs off, fall mat in place, bed alarm n/a, chair alarm on and all questions answered. RN notified of session outcome and patient response.     Goals:  Time For Goal Achievement: 7 visits  ADL Goals  Patient will groom self: Minimal Assist, at sinkside, 7 visits  Patient will dress lower body: Minimal Assist, with AE, 7 visits  Mobility and Transfer Goals  Pt will perform functional transfers: Minimal Assist, with rolling walker, 7 visits  Neuro Re-Ed Goals  Pt will perform dynamic sitting balance: Supervision, to increase ability to complete ADLs, 7 visits  Other Goal: pt will demonstrate use of  visual compensatory methods 2/2 decreased LE sensation           Vision Goals  Pt with diplopia will complete ADLs with adaptive equipment: modified independent, to locate items within environment for ADLs            PPE worn during session: procedural mask and gloves    Tech present: n/a  PPE worn by tech: N/A    Enid Derry, OTR/L  Pager [phone]      Time of Treatment  OT Received On: 12/30/21  Start Time: 1200  Stop Time: 1235  Time Calculation (min): 35 min    Treatment # 4 of 7 visits

## 2021-12-30 NOTE — Progress Notes (Signed)
[facility] Note:     Insurance authorization for acute rehab admission at [facility] initiated with [employer].           Clinical information faxed to the insurance for review.     Willette Cluster RN, BSN, CCM  Rehab Admissions Liaison/Insurance [employer] [facility]  [facility]

## 2021-12-30 NOTE — Plan of Care (Signed)
Pt went for MRI of her femurs. Pt returned from MRI and worked with PT and sat up in the chair for about 2 hours. Pt returned to bed. Pt had large BM today. Pt bladder scanned with 932 mL results. Attempted to straight cath pt but was unsuccessful. Pending straight cath by another RN.         Problem: Moderate/High Fall Risk Score >5  Goal: Patient will remain free of falls  Outcome: Progressing     Problem: Compromised Tissue integrity  Goal: Damaged tissue is healing and protected  Outcome: Progressing  Goal: Nutritional status is improving  Outcome: Progressing     Problem: Bladder/Voiding  Goal: Remains continent  Outcome: Progressing  Goal: Perineal skin integrity is maintained or improved  Outcome: Progressing  Goal: Free from infection  Outcome: Progressing  Goal: Patient will experience proper bladder emptying during admission  Outcome: Progressing     Problem: Neurological Deficit  Goal: Neurological status is stable or improving  Outcome: Progressing     Problem: Peripheral Neurovascular Impairment  Goal: Extremity color, movement, sensation are maintained or improved  Outcome: Progressing     Problem: Compromised Hemodynamic Status  Goal: Vital signs and fluid balance maintained/improved  Outcome: Progressing     Problem: Nutrition  Goal: Nutritional intake is adequate  Outcome: Progressing     Problem: Pain interferes with ability to perform ADL  Goal: Pain at adequate level as identified by patient  Outcome: Progressing     Problem: Side Effects from Pain Analgesia  Goal: Patient will experience minimal side effects of analgesic therapy  Outcome: Progressing

## 2021-12-31 ENCOUNTER — Encounter: Payer: Self-pay | Admitting: Diagnostic Radiology

## 2021-12-31 ENCOUNTER — Inpatient Hospital Stay (HOSPITAL_COMMUNITY): Payer: 59

## 2021-12-31 DIAGNOSIS — R0602 Shortness of breath: Secondary | ICD-10-CM

## 2021-12-31 LAB — ECHOCARDIOGRAM ADULT COMPLETE W CLR/ DOPP WAVEFORM
AV Area (Cont Eq VTI): 2.19
AV Area (Cont Eq VTI): 2.193
AV Mean Gradient: 6
AV Peak Velocity: 170
Ao Root Diameter (2D): 3.3
BP Mod LV Ejection Fraction: 43.333
IVS Diastolic Thickness (2D): 0.9
LA Dimension (2D): 2.5
LA Volume Index (BP A-L): 0.018
LVID diastole (2D): 4.2
LVID systole (2D): 3.3
MV Area (PHT): 9.366
MV E/A: 0.569
MV E/A: 0.6
MV E/e' (Average): 5.92
Mitral Valve Findings: NORMAL
Prox Ascending Aorta Diameter: 3.7
Pulmonary Valve Findings: NORMAL
RV Basal Diastolic Dimension: 3
RV Function: NORMAL
Site RA Size (AS): NORMAL
Site RV Size (AS): NORMAL
Tricuspid Valve Findings: NORMAL

## 2021-12-31 LAB — HEPATIC FUNCTION PANEL
ALT: 32 U/L (ref 0–55)
AST (SGOT): 20 U/L (ref 5–41)
Albumin/Globulin Ratio: 0.8 — ABNORMAL LOW (ref 0.9–2.2)
Albumin: 2.9 g/dL — ABNORMAL LOW (ref 3.5–5.0)
Alkaline Phosphatase: 60 U/L (ref 37–117)
Bilirubin Direct: 0.2 mg/dL (ref 0.0–0.5)
Bilirubin Indirect: 0.1 mg/dL — ABNORMAL LOW (ref 0.2–1.0)
Bilirubin, Total: 0.3 mg/dL (ref 0.2–1.2)
Globulin: 3.6 g/dL (ref 2.0–3.6)
Protein, Total: 6.5 g/dL (ref 6.0–8.3)

## 2021-12-31 LAB — CBC
Absolute NRBC: 0 10*3/uL (ref 0.00–0.00)
Hematocrit: 26.2 % — ABNORMAL LOW (ref 34.7–43.7)
Hgb: 8.1 g/dL — ABNORMAL LOW (ref 11.4–14.8)
MCH: 24.7 pg — ABNORMAL LOW (ref 25.1–33.5)
MCHC: 30.9 g/dL — ABNORMAL LOW (ref 31.5–35.8)
MCV: 79.9 fL (ref 78.0–96.0)
MPV: 8.6 fL — ABNORMAL LOW (ref 8.9–12.5)
Nucleated RBC: 0 /100 WBC (ref 0.0–0.0)
Platelets: 370 10*3/uL — ABNORMAL HIGH (ref 142–346)
RBC: 3.28 10*6/uL — ABNORMAL LOW (ref 3.90–5.10)
RDW: 16 % — ABNORMAL HIGH (ref 11–15)
WBC: 7.67 10*3/uL (ref 3.10–9.50)

## 2021-12-31 LAB — IRON PROFILE
Iron Saturation: 21 % (ref 15–50)
Iron: 30 ug/dL — ABNORMAL LOW (ref 40–145)
TIBC: 145 ug/dL — ABNORMAL LOW (ref 265–497)
UIBC: 115 ug/dL — ABNORMAL LOW (ref 126–382)

## 2021-12-31 LAB — HEMOLYSIS INDEX: Hemolysis Index: 17 Index (ref 0–24)

## 2021-12-31 LAB — MOTOR AND SENSORY NEUROPATHY EVAL, S
ASIALO-GM-1 AB(IGG): <1:100 {titer}
ASIALO-GM-1 Ab (IgM): <1:1600 {titer}
GD1A AB (IGG): <1:100 {titer}
GD1A AB (IGM): <1:800 {titer}
GD1B AB (IgG): <1:100 {titer}
GD1B AB (IgM): <1:800 {titer}
GM 1 IgG: <1:800 {titer}
GM-1 AB(IGM): <1:800 {titer}
GQ1B Ab (IgG): <1:100 {titer}

## 2021-12-31 LAB — BASIC METABOLIC PANEL
Anion Gap: 12 (ref 5.0–15.0)
BUN: 6 mg/dL — ABNORMAL LOW (ref 7.0–21.0)
CO2: 21 mEq/L (ref 17–29)
Calcium: 9.3 mg/dL (ref 8.5–10.5)
Chloride: 101 mEq/L (ref 99–111)
Creatinine: 0.6 mg/dL (ref 0.4–1.0)
Glucose: 86 mg/dL (ref 70–100)
Potassium: 3.8 mEq/L (ref 3.5–5.3)
Sodium: 134 mEq/L — ABNORMAL LOW (ref 135–145)

## 2021-12-31 LAB — FERRITIN: Ferritin: 492.7 ng/mL — ABNORMAL HIGH (ref 4.60–204.00)

## 2021-12-31 LAB — COVID-19 (SARS-COV-2)
SARS CoV 2 Overall Result: INVALID — AB
SARS CoV 2 Overall Result: NOT DETECTED

## 2021-12-31 LAB — GFR: EGFR: >60

## 2021-12-31 LAB — IHS D-DIMER: D-Dimer: 0.51 ug/mL FEU (ref 0.00–0.60)

## 2021-12-31 LAB — FOLATE: Folate: 8.8 ng/mL

## 2021-12-31 MED ORDER — DIPHENHYDRAMINE HCL 25 MG PO CAPS
50.0000 mg | ORAL_CAPSULE | Freq: Once | ORAL | Status: AC
Start: 2022-01-01 — End: 2022-01-01
  Administered 2022-01-01: 50 mg via ORAL
  Filled 2021-12-31: qty 2

## 2021-12-31 MED ORDER — PREDNISONE 5 MG PO TABS
50.0000 mg | ORAL_TABLET | Freq: Once | ORAL | Status: AC
Start: 2022-01-01 — End: 2022-01-01
  Administered 2022-01-01: 50 mg via ORAL
  Filled 2021-12-31 (×2): qty 2

## 2021-12-31 MED ORDER — PREDNISONE 5 MG PO TABS
50.0000 mg | ORAL_TABLET | Freq: Once | ORAL | Status: AC
Start: 2021-12-31 — End: 2021-12-31
  Administered 2021-12-31: 50 mg via ORAL
  Filled 2021-12-31: qty 2

## 2021-12-31 MED ORDER — METOPROLOL SUCCINATE ER 50 MG PO TB24
50.0000 mg | ORAL_TABLET | Freq: Every day | ORAL | Status: DC
Start: 2022-01-01 — End: 2022-01-02
  Administered 2022-01-01: 50 mg via ORAL
  Filled 2021-12-31: qty 1

## 2021-12-31 MED ORDER — DIPHENHYDRAMINE HCL 50 MG/ML IJ SOLN
25.0000 mg | Freq: Four times a day (QID) | INTRAMUSCULAR | Status: DC | PRN
Start: 2021-12-31 — End: 2022-01-03

## 2021-12-31 MED ORDER — PREDNISONE 5 MG PO TABS
50.0000 mg | ORAL_TABLET | Freq: Once | ORAL | Status: AC
Start: 2021-12-31 — End: 2021-12-31
  Administered 2021-12-31: 50 mg via ORAL
  Filled 2021-12-31 (×2): qty 2

## 2021-12-31 NOTE — Progress Notes (Signed)
[facility] Consultation Progress Note    Date Time: 12/31/21 11:16 AM  Patient Name: [name]  Outpatient Neurologist :    CC: LE weakness, paresthesia         Assessment:   [name] is a 66 y.o. female with a PMH significant for AFIB on Eliquis, HTN, HLD, DM, COPD, TIA, bipolar d/o, bipolar d/o recent rhabdomyolysis ([facility] admit 2/7-2/17/23) who presented to [facility] with:    Paraparesis in addition to arm/face paresthesias - weakness 2/2 myopathy/myositis vs muscle denervation of unknown etiology.   - CSF slightly elevated protein (52), negative ME panel, negative HSV   - Anaphylactic response to IVIg (IgA 435)   - 3/6 EMG showing a myopathic process affecting proximal bilateral lower limb muscles   - CT chest/abd/pelvis unremarkable   - CK 239, Vit D 10. LDH 450  -MRI Thighs with Mild edema and atrophy involving the semimembranosus and biceps femoris muscles of the right and left thighs with mild enhancement.  Mild edema is noted in the anterior compartment muscles of the right and left thighs with mild enhancement.  In addition, mild edema is noted in the proximal abductor longus and abductor magnus muscles bilaterally.     Neurology Attending Addendum:    I have personally reviewed the interval history, images and pertinent test results and I have personally examined the patient and confirmed the major physical findings of this resident/NP/PA note.  Also, I have noted any changes since the note was written as well as added additional findings and recommendations.   I reviewed the brain imaging personally and I agree with the findings   In summary:     Please see my note from today     Undifferentiated myopathy, remitting and relapsing, needs bx    F/u w neuro and nsurg      A. [name], MD  Neuro-hospitalist  [facility]      Patient Active Problem List   Diagnosis    CVA (cerebral vascular accident)    Chest pain    Chest pain with high risk of acute coronary syndrome    Paroxysmal atrial  fibrillation    Hypertension    Hyperlipidemia    Seizure disorder    History of gastrointestinal hemorrhage    Depression    Anemia    Asthma    Non-traumatic subcutaneous emphysema    Chest pain with moderate risk for cardiac etiology    Left-sided weakness    History of asthma    History of transient ischemic attack (TIA)    Type 2 diabetes mellitus    Syncope and collapse    Weakness of both legs    Hypokalemia    Thoracic back pain    Gastroparesis    Guillain Barr syndrome           Plan:   - Medical management per primary team  - Would pursue muscle biopsy  - Follow-up pending labs including encephalopathy autoimmune eval, ganglioside ab panel, anti IgA ab test, myositis ab panel, MG panel, paraneoplastic panel, motor and sensory neuropathy eval, B6, B1, final CSF culture  - Continue Thiamine replacement   - PT/OT      Interval History/Subjective:   No acute events documented or reported overnight  Reports she is feeling better, "improved strength" and "improved sensation"      HbA1c 5.3%  HIV nonreactive  Vit B12 442  TSH 1.19  Syphilis nonreactive  CK 239  CRP 2.7  Zinc 48  Copper 131  ANA screen negative      Medications:     Current Facility-Administered Medications   Medication Dose Route Frequency    acetaminophen  650 mg Oral Daily    amLODIPine  2.5 mg Oral Daily    [Held by provider] apixaban  5 mg Oral Q12H SCH    bisacodyl  5 mg Oral Daily    budesonide-formoterol  2 puff Inhalation BID    diphenhydrAMINE  25 mg Oral Daily    lidocaine  1 patch Transdermal Q24H    lurasidone  120 mg Oral Daily    metoprolol succinate XL  25 mg Oral Daily    nortriptyline  10 mg Oral QHS    pantoprazole  40 mg Oral Daily    potassium chloride  40 mEq Oral BID    rOPINIRole  1 mg Oral QHS    terazosin  2 mg Oral QHS    thiamine  100 mg Oral Daily    vitamin B-6  50 mg Oral Daily    vitamin D (cholecalciferol)  25 mcg Oral Daily    vitamins/minerals  1 tablet Oral Daily    zinc sulfate  220 mg Oral Daily       Review  of Systems:   All other systems were reviewed and are otherwise negative except as referenced in the above interval history above.      Physical Exam:   Temp:  [97.7 F (36.5 C)-98.4 F (36.9 C)] 97.9 F (36.6 C)  Heart Rate:  [107-117] 110  Resp Rate:  [16-19] 18  BP: (108-138)/(65-82) 117/75    Vital Signs:  Reviewed    General: Well developed and well nourished. No acute distress. Cooperative with the exam  ENT: Normal oral mucosa, no ear or nose discharge  Neck: Symmetric, no deformities  CV: RRR  Resp: No audible wheezing, normal work of breathing  Abd: Soft, nondistended  Skin: Intact, extremities normal in color  Psych: Affect is normal, good insight    Mental Status: The patient is awake, alert and oriented to person, place, and time.   Attention span and concentration appear normal.  Speech normal, no evidence of aphasia     Cranial nerves:   -CN III, IV, VI: Pupils equal, round, and reactive to light; extraocular movements intact; no ptosis; no nystagmus  -CN VII: Face symmetric  -CN VIII: Hearing intact to conversational speech   -CN IX, X: Normal phonation   -CN XI: UTA  -CN XII: Tongue protrudes midline    Motor: Muscle tone normal without spasticity or flaccidity. No atrophy.       UEs:   Deltoid Bicep Tricep Grip   Right 5 5 5 5    Left 5 5 5 5      LEs   HF KF KE PF DF   Right 5 5 5 5 5    Left 5 5 5 5 5      Sensory: Light touch slightly decreased on the left.    Temperature, Vibration and proprioception intact    Reflexes: DTRs absent, plantars are flexor    Coordination: FTN intact. No tremors/abnormal movements    Gait: Deferred      Labs:   Labs reviewed. Managed by primary team.     Results       Procedure Component Value Units Date/Time    Folate [[id number]] Collected: 12/31/21 0850    Specimen: Blood Updated: 12/31/21 1106     Folate 8.8 ng/mL  Ferritin [[id number]]  (Abnormal) Collected: 12/31/21 0850    Specimen: Blood Updated: 12/31/21 1051     Ferritin 492.70 ng/mL     IRON PROFILE  [[id number]]  (Abnormal) Collected: 12/31/21 0850     Updated: 12/31/21 1042     Iron 30 ug/dL      UIBC 782 ug/dL      TIBC 956 ug/dL      Iron Saturation 21 %     Hemolysis index [[id number]] Collected: 12/31/21 0850     Updated: 12/31/21 1042     Hemolysis Index 17 Index     D-Dimer [[id number]] Collected: 12/31/21 0850     Updated: 12/31/21 0955     D-Dimer 0.51 ug/mL FEU     Tissue Transglutaminase Antibody Panel [[id number]] Collected: 12/31/21 0850     Updated: 12/31/21 0857    CBC without differential [[id number]]  (Abnormal) Collected: 12/31/21 0356    Specimen: Blood Updated: 12/31/21 0524     WBC 7.67 x10 3/uL      Hgb 8.1 g/dL      Hematocrit 03.4 %      Platelets 370 x10 3/uL      RBC 3.28 x10 6/uL      MCV 79.9 fL      MCH 24.7 pg      MCHC 30.9 g/dL      RDW 16 %      MPV 8.6 fL      Nucleated RBC 0.0 /100 WBC      Absolute NRBC 0.00 x10 3/uL     Basic Metabolic Panel [[id number]]  (Abnormal) Collected: 12/31/21 0356    Specimen: Blood Updated: 12/31/21 0519     Glucose 86 mg/dL      BUN 6.0 mg/dL      Creatinine 0.6 mg/dL      Calcium 9.3 mg/dL      Sodium 742 mEq/L      Potassium 3.8 mEq/L      Chloride 101 mEq/L      CO2 21 mEq/L      Anion Gap 12.0    Hepatic function panel (LFT) [[id number]]  (Abnormal) Collected: 12/31/21 0356    Specimen: Blood Updated: 12/31/21 0519     Bilirubin, Total 0.3 mg/dL      Bilirubin Direct 0.2 mg/dL      Bilirubin Indirect 0.1 mg/dL      AST (SGOT) 20 U/L      ALT 32 U/L      Alkaline Phosphatase 60 U/L      Protein, Total 6.5 g/dL      Albumin 2.9 g/dL      Globulin 3.6 g/dL      Albumin/Globulin Ratio 0.8    GFR [[id number]] Collected: 12/31/21 0356     Updated: 12/31/21 0519     EGFR >60.0    Basic Metabolic Panel [[id number]]  (Abnormal) Collected: 12/30/21 1806    Specimen: Blood Updated: 12/30/21 1942     Glucose 100 mg/dL      BUN 6.0 mg/dL      Creatinine 0.6 mg/dL      Calcium 9.1 mg/dL      Sodium 601 mEq/L      Potassium 3.9 mEq/L      Chloride 101 mEq/L      CO2  23 mEq/L      Anion Gap 10.0    GFR [[id number]] Collected: 12/30/21 1806     Updated: 12/30/21 1942     EGFR >60.0    Creatine  Kinase (CK) [[id number]] Collected: 12/30/21 1806    Specimen: Blood Updated: 12/30/21 1940     Creatine Kinase (CK) 84 U/L     Lactate dehydrogenase [[id number]]  (Abnormal) Collected: 12/30/21 1806    Specimen: Blood Updated: 12/30/21 1856     LDH 450 U/L     CBC without differential [[id number]]  (Abnormal) Collected: 12/30/21 1806    Specimen: Blood Updated: 12/30/21 1844     WBC 8.82 x10 3/uL      Hgb 7.9 g/dL      Hematocrit 08.6 %      Platelets 389 x10 3/uL      RBC 3.15 x10 6/uL      MCV 80.0 fL      MCH 25.1 pg      MCHC 31.3 g/dL      RDW 16 %      MPV 8.4 fL      Nucleated RBC 0.0 /100 WBC      Absolute NRBC 0.00 x10 3/uL     Aldolase [[id number]] Collected: 12/30/21 1806    Specimen: Blood Updated: 12/30/21 1834            Rads:   CT Abdomen Pelvis WO IV/ WO PO Cont    Result Date: 12/28/2021  Distended urinary bladder. Otherwise unremarkable noncontrast abdomen pelvis CT. Rocky Crafts, MD 12/28/2021 11:26 AM    CT Head WO Contrast    Result Date: 12/24/2021   No acute intracranial process. Sandie Ano, MD  12/24/2021 8:17 PM    CT Chest WO Contrast    Result Date: 12/28/2021  No evidence of malignancy in the chest. Carlynn Spry, MD 12/28/2021 2:41 PM    MRI Brain WO Contrast    Result Date: 12/25/2021  No acute intracranial abnormality. Mild chronic microvascular ischemic changes. Cavum septum pellucidum and cavum vergae. Alric Seton MD, MD  12/25/2021 2:47 AM    MRI Cervical Spine WO Contrast    Result Date: 12/25/2021  No cord compression or intrinsic cord signal abnormality. Multilevel degenerative changes as described above, with varying degrees of neural foraminal narrowing, and no central canal stenosis. Alric Seton MD, MD  12/25/2021 2:53 AM    MRI Thoracic Spine WO Contrast    Result Date: 12/25/2021  No cord compression or intrinsic cord signal abnormality. Minimal degenerative changes  as described above. Alric Seton MD, MD  12/25/2021 2:56 AM    MRI Lumbar Spine WO Contrast    Result Date: 12/25/2021  1. 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 Orson Gear, MD  12/25/2021 2:54 AM    Chest AP Portable    Result Date: 12/24/2021   No acute pulmonary or pleural disease. Sandie Ano, MD  12/24/2021 7:14 PM    MRI Femur Right W WO Contrast    Result Date: 12/30/2021  1. Mild edema and atrophy involving the semimembranosus and biceps femoris muscles of the right and left thighs, noted to be bilateral and symmetric in appearance. Mild enhancement on the postcontrast images. Very mild edema in the anterior compartment muscles of the right and left thighs, with mild enhancement. Mild atrophy of the semimembranosus and biceps femoris muscles bilaterally. These findings are nonspecific, and may be seen in the setting of muscle denervation or myositis. 2. No atrophy involving the anterior compartment muscles. 3. Mild edema in the proximal adductor longus and adductor magnus muscles bilaterally. 4. No areas of nonenhancement to suggest muscle  necrosis. [name], MD 12/30/2021 3:22 PM    MRI Femur Left W WO Contrast    Result Date: 12/30/2021  1. Mild edema and atrophy involving the semimembranosus and biceps femoris muscles of the right and left thighs, noted to be bilateral and symmetric in appearance. Mild enhancement on the postcontrast images. Very mild edema in the anterior compartment muscles of the right and left thighs, with mild enhancement. Mild atrophy of the semimembranosus and biceps femoris muscles bilaterally. These findings are nonspecific, and may be seen in the setting of muscle denervation or myositis. 2. No atrophy involving the anterior compartment muscles. 3. Mild edema in the proximal adductor longus and adductor magnus muscles bilaterally. 4. No areas of nonenhancement to suggest muscle necrosis. [name], MD 12/30/2021 3:22  PM    Triple Lumen Dialysis Cath    Result Date: 12/28/2021  Right IJ 13 French 20 cm trialysis catheter placement. Tip in the deep right atrium (only place where adequate flow was obtained) . PLAN: Catheter ready for use. PROCEDURE SUMMARY: *Venous access in ultrasound guidance. *Nontunneled dialysis catheter placement. TECHNIQUE/FINDINGS: Informed consent for the procedure including risks, benefits and alternatives was obtained. A safety timeout was performed and documented with all members of the procedure team present, including verification of patient identification, correct procedure, procedure site, and laterality. The patient was positioned supine. Preparation: (MIPS): The site was prepared and draped using all elements of maximal sterile barrier technique including sterile gloves, sterile gown, cap, mask, large sterile sheet, sterile ultrasound probe cover, hand hygiene and cutaneous antisepsis with 2% chlorhexidine. Medical reason for site preparation exception (MIPS): Not applicable. Sedation: The patient was placed under no additional IV sedation which was administered/monitored by not applicable. Sonographic interrogation of the right internal jugular vein demonstrated patency. Local anesthesia was administered at the access site. A small dermatotomy an #11 scalpel was made and venous access was achieved with a 21 gauge single wall needle. Sonographic images were obtained pre- and post- puncture for documentation.  A 0.018 inch guidewire was advanced into the inferior vena cava. The needle was exchanged for a 5 French coaxial dilator system. Using fluoroscopic guidance, the 0.018 inch guidewire was removed and marked at the appropriate length for the intravascular portion of the catheter and a 0.035 inch wire was then advanced into the inferior vena cava. Sequential dilation was performed over the wire and the tiialysis catheter was advanced. The wire was removed. The catheter were evaluated for  appropriate flow and were flushed with normal saline. The catheter was secured to the skin with nonabsorbable suture. A sterile dressing was applied. The procedure was well tolerated.  A fluoroscopic image was obtained to document catheter tip position at the right atrium. All indicated images were saved to PACS. CONTRAST: Contrast agent: None Contrast volume (mL): 0 RADIATION PARAMETERS: Fluoroscopy time (minutes): 0.6  Reference air kerma (mGy): 3.6 Darci Current, MD 12/28/2021 12:42 PM       Signed by:  Katheren Shams, FNP-C  Nurse Practitioner  [facility]  Daytime: [id number]  Consult requests: [phone]  After 7:00 pm: [phone]         *This note was generated by the Epic EMR system and Dragon speech recognition and may contain errors or omissions not intended by the user. Grammatical errors, random word insertions, deletions, incomplete sentences among other errors are occasional consequences of this technology.  If there are questions or concerns about the content of this note or information contained within the body of  this dictation they should be addressed directly with the author for clarification.

## 2021-12-31 NOTE — Malnutrition Assessment (Signed)
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 female patient.   [id number]    Malnutrition Assessment       Nutrition Diagnosis:   Moderate malnutrition related to inadequate protein energy intake in the setting of acute illness as evidenced by intake <75% of estimated energy requirements for >1 week,  7.5% weight loss in 3 months,  mild depletion of muscle loss (temporalis, pectoralis major, quadriceps, gastrocnemius).       Madison Hickman, RDN      If physician disagrees with this assessment see addendum.

## 2021-12-31 NOTE — Plan of Care (Signed)
Pt resting in bed. Pt had urinary retention and a foley cath was placed. Pt tolerated procedure well. Echo done. Pt pending CTA in AM. Pt pre-treated for contrast allergy. CTA can be done after 0400 tomorrow. Pt placed on telemetry.        Problem: Moderate/High Fall Risk Score >5  Goal: Patient will remain free of falls  Outcome: Progressing     Problem: Compromised Tissue integrity  Goal: Damaged tissue is healing and protected  Outcome: Progressing  Goal: Nutritional status is improving  Outcome: Progressing     Problem: Bladder/Voiding  Goal: Remains continent  Outcome: Progressing  Goal: Perineal skin integrity is maintained or improved  Outcome: Progressing  Goal: Free from infection  Outcome: Progressing  Goal: Patient will experience proper bladder emptying during admission  Outcome: Progressing     Problem: Neurological Deficit  Goal: Neurological status is stable or improving  Outcome: Progressing     Problem: Peripheral Neurovascular Impairment  Goal: Extremity color, movement, sensation are maintained or improved  Outcome: Progressing     Problem: Compromised Hemodynamic Status  Goal: Vital signs and fluid balance maintained/improved  Outcome: Progressing     Problem: Nutrition  Goal: Nutritional intake is adequate  Outcome: Progressing     Problem: Pain interferes with ability to perform ADL  Goal: Pain at adequate level as identified by patient  Outcome: Progressing     Problem: Side Effects from Pain Analgesia  Goal: Patient will experience minimal side effects of analgesic therapy  Outcome: Progressing

## 2021-12-31 NOTE — Plan of Care (Signed)
Problem: Moderate/High Fall Risk Score >5  Goal: Patient will remain free of falls  Outcome: Progressing  Flowsheets (Taken 12/30/2021 0800 by Reather Converse, RN)  High (Greater than 13):   HIGH-Consider use of low bed   HIGH-Initiate use of floor mats as appropriate   HIGH-Apply yellow "Fall Risk" arm band   HIGH-Bed alarm on at all times while patient in bed   HIGH-Visual cue at entrance to patient's room     Problem: Compromised Tissue integrity  Goal: Damaged tissue is healing and protected  Outcome: Progressing  Flowsheets (Taken 12/28/2021 1502 by Henrine Screws, RN)  Damaged tissue is healing and protected:   Monitor/assess Braden scale every shift   Provide wound care per wound care algorithm   Reposition patient every 2 hours and as needed unless able to reposition self   Increase activity as tolerated/progressive mobility   Avoid shearing injuries   Relieve pressure to bony prominences for patients at moderate and high risk   Use bath wipes, not soap and water, for daily bathing   Keep intact skin clean and dry   Use incontinence wipes for cleaning urine, stool and caustic drainage. Foley care as needed   Monitor external devices/tubes for correct placement to prevent pressure, friction and shearing   Utilize specialty bed  Goal: Nutritional status is improving  Outcome: Progressing  Flowsheets (Taken 12/30/2021 0137)  Nutritional status is improving: Allow adequate time for meals     Problem: Bladder/Voiding  Goal: Perineal skin integrity is maintained or improved  Outcome: Progressing  Flowsheets (Taken 12/27/2021 1334 by Henrine Screws, RN)  Perineal skin integrity is maintained or improved:   Keep intact skin clean and dry   Use protective skin barriers to decrease potential skin breakdown   Consult/collaborate with Wound Care Nurse  Goal: Patient will experience proper bladder emptying during admission  Outcome: Progressing  Flowsheets (Taken 12/28/2021 2051)  Patient will experience proper bladder emptying  during admission:   Monitor intake and output   Encourage patient to empty bladder at regular intervals     Problem: Neurological Deficit  Goal: Neurological status is stable or improving  Outcome: Progressing  Flowsheets (Taken 12/27/2021 1334 by Henrine Screws, RN)  Neurological status is stable or improving: Monitor/assess/document neurological assessment (Stroke: every 4 hours)     Problem: Peripheral Neurovascular Impairment  Goal: Extremity color, movement, sensation are maintained or improved  Outcome: Progressing  Flowsheets (Taken 12/28/2021 0421 by Roanna Epley, RN)  Extremity color, movement, sensation are maintained or improved:   Increase mobility as tolerated/progressive mobility   Assess and monitor application of corrective devices (cast, brace, splint), check skin integrity   Assess extremity for proper alignment     Problem: Compromised Hemodynamic Status  Goal: Vital signs and fluid balance maintained/improved  Outcome: Progressing  Flowsheets (Taken 12/28/2021 0421 by Roanna Epley, RN)  Vital signs and fluid balance are maintained/improved:   Position patient for maximum circulation/cardiac output   Monitor/assess vitals and hemodynamic parameters with position changes   Monitor and compare daily weight   Monitor intake and output. Notify LIP if urine output is less than 30 mL/hour.   Monitor/assess lab values and report abnormal values     Problem: Nutrition  Goal: Nutritional intake is adequate  Outcome: Progressing  Flowsheets (Taken 12/28/2021 0421 by Roanna Epley, RN)  Nutritional intake is adequate:   Monitor daily weights   Assist patient with meals/food selection   Allow adequate time for meals     Problem:  Pain interferes with ability to perform ADL  Goal: Pain at adequate level as identified by patient  Outcome: Progressing  Flowsheets (Taken 12/28/2021 1502 by Henrine Screws, RN)  Pain at adequate level as identified by patient:   Identify patient comfort function goal   Assess for risk of  opioid induced respiratory depression, including snoring/sleep apnea. Alert healthcare team of risk factors identified.   Assess pain on admission, during daily assessment and/or before any "as needed" intervention(s)   Reassess pain within 30-60 minutes of any procedure/intervention, per Pain Assessment, Intervention, Reassessment (AIR) Cycle   Evaluate if patient comfort function goal is met   Evaluate patient's satisfaction with pain management progress   Offer non-pharmacological pain management interventions     Problem: Side Effects from Pain Analgesia  Goal: Patient will experience minimal side effects of analgesic therapy  Outcome: Progressing  Flowsheets (Taken 12/28/2021 1502 by Henrine Screws, RN)  Patient will experience minimal side effects of analgesic therapy: Monitor/assess patient's respiratory status (RR depth, effort, breath sounds)

## 2021-12-31 NOTE — PT Progress Note (Signed)
Physical Therapy Note    [facility]   Physical Therapy Cancellation Note      Patient:  [name] MRN#:  [id number]  Unit:  [facility] TOWER 7 Room/Bed:  F745/F745.01    12/31/2021  Time: 12:44 PM      Pt not seen for physical therapy secondary to per MD pending work up 2/2 elevated HR, requesting PT hold. Will continue to f/u as appropriate and as schedule permits.       Clent Jacks PT, DPT  [phone]

## 2021-12-31 NOTE — Progress Notes (Signed)
12/31/21 0934   Respiratory Parameters - Non Ventilated   Vital capacity 1.45 L   $ Vital Capacity Performed? Yes   Negative inspiratory force -15 cm H2o   $ NIF DONE Yes   Equipment labeled Yes   Adverse Reactions None

## 2021-12-31 NOTE — Plan of Care (Signed)
MRI thigh b/l arrived back w non specific myositis v muscle denerv.  Vitaminosis + , low vit d, b6,    B1 pending  Aldolase pending  Several serum sendout for autoimm, sensory motor dis., pending such as MG, autoimm, GM ganglio etc  LDH elevated  B12 normal   Ck normal now    Undifferentiated myopathy/inllamm likely, no necrosis seen on MRI thighs to sugg autoimm process, d/c [place], pt at this time is not indicated for plex, attempted IVIG w anaphylaxis reaction     Slight elevation of csf protein other wise benign  Pt recovery of proprioception and sensory during hosp was not aligned w expectation of neurological recovery    CT chest abd pelvis w/o occult ca    Pt had a 15 pound weight loss, poor appetite, poss all her issues may be related to nutritional   See EMG    ==      Pt will need muscle bx , discussed w attn  F/u serum sendouts

## 2021-12-31 NOTE — Consults (Addendum)
NUTRITION:    Reason for Assessment: b6, zinc and vitamin d deficiency.    Intervention / Recommendations:  Continue with current heart healthy diet.   Continue with supplementation of Vit D, B6 and Zinc.  DRI:  Zinc 8 mg/d; Vit B6 1.5 mg/d; Vit D 10 micrograms ( 400 IU) /d  Zinc may require 4-6 weeks of supplementation to return to normal. Zinc may interfere with copper absorption when taken long term- monitor.   Zinc deficiency may have affected pt sense of taste which may have interfered with poor po intake prior to admission  Provided written guidelines for food sources high in these nutrients for patient to have after discharge.  Pt indicates she will going to Acute Rehab before d/c to home.  Goals: intake >75% of at least 2 meals daily, no unplanned significant weight change and repleting nutrient deficiencies.    Clinical Update:  [name] is a(n) 66 y.o. 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   Deficiencies of: Vitamin D, Zinc, B6 supplemented to replete.  Skin: per nursing assessment   Recent Labs   Lab 12/31/21  0356   Sodium 134*   Potassium 3.8   Chloride 101   CO2 21   BUN 6.0*   Creatinine 0.6   Calcium 9.3   Albumin 2.9*   Protein, Total 6.5   Bilirubin, Total 0.3   Alkaline Phosphatase 60   ALT 32   AST (SGOT) 20   Glucose 86   Mg 2.0, B12 442, CRP 2.7, Vit B6 <2, Vit D 25-OH 10, Zinc 48 mcg/dl    Lab Results   Component Value Date    HGBA1C 5.3 12/27/2021      Nutrition Focused Physical Exam  Head: no overt s/s subcutaneous muscle or fat loss, temple region: slight depression with decrease in muscle tone/resistance (mild muscle loss - temporalis), and buccal region: slight depression, somewhat sunken appearance, flat cheeks, decrease in  bounce back of fat pads (mild fat loss)  Upper Body: clavicle bone region: some protrusion of the clavicle with decrease in muscle tone/resistance (mild muscle loss - pectoralis major) and shoulder and Acromion bone region: rounded, curves at shoulder/arm juncture, feel muscle tone/resistance (no wasting observed)  Lower Body: no s/s subcutaneous fat or muscle loss, anterior thigh and patellar region: slight depression along inner/outer thigh, patella slightly prominent, feel decrease in muscle tone/resistance in quadriceps to the patella (mild muscle loss - quadriceps), and posterior calf region: some shape to the bulb, but not well-developed, decrease in muscle tone/resistance (mild muscle loss - gastrocnemius)     Meds: norvasc, dulcolax, latuda, toprol, pamelor, protonix, klor, requip, hytrin, thiamine, Vit B6, Vit D, MVI, zinc sulfate    Anthropometrics:  Height: 165.1 cm (5\' 5" )  Weight: 72.6 kg (160 lb)  Body mass index is 26.63 kg/m.  IBW: 125# / 56.8 kg    Nutrition Goals: 1420-1700 kcals (25-30 kcals/kg IBW); 85-114 gm pro (1.5-2.0 gm pro / kg IBW = 1.17-1.57 gm pro / kg actual body weight)    Diet / Nutrition Support Order: Diet heart healthy  Allergies   Allergen Reactions    Singulair [Montelukast] Itching    Immune Globulin (Human) Itching and Facial Swelling     Pt complain of itchiness and swelling of  lips and tongue. Saturation fine, bp elevated.    Myoview [Technetium-64m] Itching    Contrast [Iodinated Contrast Media]     Latex     Nitroglycerin     Penicillins     Tetracyclines & Related      Learning Needs: Food source for Vit B6, Vit D and zinc.    Nutrition Summary:  Po intake: Met with patient who indicated she had poor po intake prior to admission <75% x  >=1 week. Had tried to eat but put the food in her mouth and couldn't finish.  variable intake 50-100% after admission; 3/7 ~25% lunch    Weight loss: 23.8% wt loss in 16 months;    Weight Monitoring Weight   09/09/2020 95.255 kg    12/24/2021 76.204 kg   12/25/2021 72.576 kg   12/26/2021 72.576 kg     Weight loss per nutrition screen 14-23# in last 3 months. 14# (8% ) loss in 3 months.    Nutrition Diagnosis:   Moderate malnutrition related to inadequate protein energy intake in the setting of acute illness as evidenced by intake <75% of estimated energy requirements for >1 week,  7.5% weight loss in 3 months,  mild depletion of muscle loss (temporalis, pectoralis major, quadriceps, gastrocnemius).    Monitor/Eval:  Monitor nutrition support goals, weight, labs, GI function, medical treatment plan    Madison Hickman, RDN,  MS RD

## 2021-12-31 NOTE — Progress Notes (Signed)
[facility]   [facility] PROGRESS NOTE    Today's date & time: 12/31/21 8:27 AM  Patient Name: [name]  Attending Physician: Frederich Cha, DO  Admission Date: 12/25/2021    Assessment:     Active Hospital Problems    Diagnosis    Hypokalemia    Thoracic back pain    Gastroparesis    Weakness of both legs    Guillain Barr syndrome    Type 2 diabetes mellitus    Paroxysmal atrial fibrillation     Plan:   Difficulty walking and bilateral leg and hand weakness - likely from statin induced myositis vs inflammatory myositis vs immune mediated myostitis. patient to has multiple vague complaints who was transferred from outside hospital for consideration of EMG for [name] consideration, and possible IVIG, currently seen by neurology team here and based on her history started the patient on thiamine IV, at this time we will continue to perform EMG, possible IVIG, LP was done and came back unremarkable, got seen by PT and OT team who recommended going to acute rehab, fall precaution, continue monitoring.  Patient had NIF and VC done on 3/5 which showed low volume.  Patient continued proximal muscle weakness, areflexia, highly suspicious for GBS and neurology team started the patient on IVIG on 12/27/2021, after the start of IVIG within the first 10 minutes patient developed numbness on the upper lip on the right side and tongue numbness, was that it was discontinued and was placed as allergy, following that IgG antibodies were ordered, IVIG antibodies ordered, stroke team recommended placing dialysis catheter and doing plasma exchange, pathology team was contacted, patient says every day she is feeling better, currently awaiting the start of plasma exchange.  Patient also had a EMG done on 12/27/2021, based on that CT scan of the abdomen and pelvis was done which showed no hematoma.   MRI L and R thigh or femur W WO   Mild edema and atrophy involving the semimembranosus and biceps  femoris muscles of  the right and left thighs, noted to be bilateral and  symmetric in appearance. These findings are nonspecific, and may be seen in the setting of muscle denervation or myositis.  Mild edema in the proximal adductor longus and adductor magnus muscles bilaterally.   No areas of nonenhancement to suggest muscle necrosis.  LDH 450, CPK 84, LFTs, aldolase, viral hepatitis panel pending  Plan to discontinue HD catheter after CTA chest  Appreciate input from neurology team.   Vitamin D deficiency - repleted.   Zinc deficiency - repleted  B6 deficiency - repleted  Iron deficiency anemia and AICD - eval for Celiac disease  Upper abdominal and mid back pain - patient complaining of having some pain in the epigastric area banding leg to the mid back, she is placed on PPI, Tums, muscle relaxants, as needed pain medications, may consider placing the patient on scheduled Tylenol, will continue monitoring.  Pain is getting better by the day .   Poor appetite/malnourished - nutrition consult.  CT Chest/Abd/Pelvis - no malignancy noted.    Urinary retention - tamsulosin 0.4mg  po daily.  Foley catheter placed.    Paroxysmal atrial fibrillation/tachycardia - rate is controlled, patient is on apixaban.  Metoprolol succcinate increased to 50mg  po daily .  CTA chest to rule PE, inflammatory disease or malignancy.    Cardiomyopathy   ECHO Summary    * Left ventricular systolic function is mildly decreased with an ejection  fraction by Biplane  Method of Discs of  43 %.    * Mild global hypokinesis.    * There is mild aortic regurgitation.    * There is a venous catheter visualized in the right atrium.  Cardiology consult in AM  Hypertension - blood pressure is controlled, continue monitoring.   COPD - noted, patient is getting Symbicort, in no acute respiratory distress, on oxygen.   Type 2 diabetes mellitus - borderline hemoglobin A1c in the past 2020 at 6.0, currently no treatment, hemoglobin A1c 5.3%.   Hypokalemia - replaced, monitor  closely, magnesium level is also lower, replaced magnesium, now  started BID potassium ordered.   Anemia - most likely poor nutrition and chronic disease, will check iron profile, consider iron infusion.  Multivitamins added.   Bipolar disorder - patient seem to have some issues with behavior, patient is on nortriptyline and Latuda, no acute exacerbation, continue monitoring.   Constipation - patient said last bowel meant a few days ago, stool softeners and laxatives given, had multiple bowel movements on the evening of 12/27/2021, continue monitoring    GI prophylaxis: Protonix, Maalox  DVT ppx Apixaban on hold to remove HD catheter.      Spoke to patient's daughter over the phone and updated her about patient's condition    Subjective     CC: Weakness of both legs    Interval History/24 hour events: Patient say every day she is getting better, no shortness of breath, no chest pain, back pain seem to be getting better    HPI: HPI per Admitting Provider   " [name] is a 66 y.o. female hx afib on Eliquis, HTN, HLD, DM, COPD, TIAs, COPD, bipolar disorder, [facility] admit 2/7-2/17/23 for rhabdomyolysis (peak CK 4464 - statin d/c'ed) + vomiting ?gastroparesis + starvation ketoacidosis + UTI, [facility] admit 2/18-2/20/23 for rhabdomyolysis (peak CK 2509) + transaminitis + bilateral leg weakness + right paresthesias, [facility] ER visit 3/2 for bilateral leg weakness who presents to [facility] with difficulty standing and walking, bilateral leg/hand weakness, bilateral leg/hand/face paresthesias. She noted bilateral leg weakness with her prior [facility] admission 2/7-2/17 and was readmitted for bilateral leg weakness 2/18-2/20. The past 4 days she has had difficulty standing and walking. The past 4 days she notes new bilateral hand and face paresthesias and bilateral hand weakness. She has some shortness of breath but is speaking in full sentences. She went to [facility] ER 3/2 and was discharged. She saw her PMD who advised she go to an  [facility]. At [facility] she got MRI brain and CTL-spine which did not show a cause for her symptoms. [facility] called neurology Dr. Francesco Sor who said to consider IVIG for 5 days, LP, EMG. The LP was done at [facility]. Dr. Francesco Sor advised transfer to [facility]. These symptoms are sudden onset, moderate intensity, without alleviating factors. "     Hospital course:   Following admission, patient was seen by neurology consult who performed MRI of the entire spine and brain, without contrast which came back to show no major issues except on the lumbar area L4-L5 disc disease with facet arthrosis and degenerative changes, neurology team recommended LP and CSF studies so far are unrevealing, patient continued to complain of having bandlike pain around the epigastric area & in the back, there was tenderness on the right side of the thoracic vertebral area in the muscle, Lidoderm patch applied, patient is getting Protonix and Maalox, has not had a bowel movement for that reason given lactulose and stool softeners.  Patient was [facility] for consideration of IVIG with a suspicion of GBS, EMG currently pending.     Due to her history of not eating well and has been losing weight over the past few weeks, neurology team felt may be nutritional and started on on IV thiamine, patient has been requesting narcotics for the pain that she is got but not really having any tenderness on examination.  Patient did NIF and vital capacity on the morning of 12/26/2021 which came back -12 cm H2O and vital capacity 1.5 L.  Patient is not in any respiratory distress, not needing any oxygen at this time.  Patient had additional laboratory studies including HIV and syphilis which came back negative, negative for COVID 19, potassium was noted to be low for which replacement was given, CPK slightly elevated at 239.  Cultures remain negative.     Patient was seen by PT and OT team who recommended going to acute rehab.  Patient was having  constipation, stool softeners and laxatives given.     3/8: Pt feels better.  Diffuse pain improved.  LE weakness improved.    3/9: Patient seen and examined she was noted to have abdominal distention.  Patient reports poor appetite over the past 2 weeks.  She denies any history of bariatric surgery or malabsorption disorder.  3/10: Pt c/o shortness of breath and palpitations, but no chest pain.  Pt has remained tachycardic throughout her hospitalization.  Pt urinates, but she still retains urine requiring straight catheterization.        Review of Systems:   General - back pain   Resp - no SOB   CVS - no CP, no CAD   GI - no nausea, no diarrhea   CNS - no visual deficit, generalized  weakness    Physical Exam:     Vitals:    12/30/21 1847 12/30/21 2331 12/31/21 0350 12/31/21 0700   BP: 138/82 108/71 126/79 117/75   Pulse: (!) 111 (!) 116 (!) 107 (!) 110   Resp: 17 19 16 18    Temp: 98.1 F (36.7 C) 97.7 F (36.5 C) 97.9 F (36.6 C) 97.9 F (36.6 C)   TempSrc: Oral Oral Oral Oral   SpO2: 100% 100% 100% 100%   Weight:       Height:            No intake or output data in the 24 hours ending 12/31/21 0827    General: Awake, alert, oriented x 3; no acute distress.  HEENT: PERRLA, eomi, sclera anicteric, oropharynx clear without lesions, mucous membranes moist  Neck: supple, no lymphadenopathy, no thyromegaly, no JVD, no carotid bruits  Cardiovascular: regular rate and rhythm, no murmurs, rubs or gallops  Lungs: clear to auscultation bilaterally, without wheezing, rhonchi, or rales  Abdomen: soft, non-tender, suprapubic area distended; no palpable masses, no hepatosplenomegaly, normoactive bowel sounds, no rebound or guarding  Extremities: no clubbing, cyanosis, or edema  Neuro: cranial nerves grossly intact, strength 5/5 in upper extremities except 3+/5 hand intrinsics, bilateral hip extension 2/5, bilateral knee extension 3/5, bilateral dorsiflexion 3/5, bilateral plantarflexion 4/5, paresthesias bilateral  feet/hands/face but intact to light touch, could not elicit knee DTRs, follows commands.   Skin: no rashes or lesions noted  GU: no CVA tenderness     Meds:   Medications were reviewed by me:  Current Facility-Administered Medications   Medication Dose Route Frequency    acetaminophen  650 mg Oral Daily    amLODIPine  2.5 mg Oral Daily    [Held by provider] apixaban  5 mg Oral Q12H SCH    bisacodyl  5 mg Oral Daily    budesonide-formoterol  2 puff Inhalation BID    diphenhydrAMINE  25 mg Oral Daily    lidocaine  1 patch Transdermal Q24H    lurasidone  120 mg Oral Daily    metoprolol succinate XL  25 mg Oral Daily    nortriptyline  10 mg Oral QHS    pantoprazole  40 mg Oral Daily    potassium chloride  40 mEq Oral BID    rOPINIRole  1 mg Oral QHS    terazosin  2 mg Oral QHS    thiamine  100 mg Oral Daily    vitamin B-6  50 mg Oral Daily    vitamin D (cholecalciferol)  25 mcg Oral Daily    vitamins/minerals  1 tablet Oral Daily    zinc sulfate  220 mg Oral Daily       Labs:   Labs reviewed personally include:  Recent Labs   Lab 12/31/21  0356 12/30/21  1806   WBC 7.67 8.82   Hgb 8.1* 7.9*   Hematocrit 26.2* 25.2*   Platelets 370* 389*    Recent Labs   Lab 12/26/21  0536   PT 13.1*   PT INR 1.1   PTT 27       Recent Labs   Lab 12/31/21  0356 12/30/21  1806   Sodium 134* 134*   Potassium 3.8 3.9   Chloride 101 101   CO2 21 23   BUN 6.0* 6.0*   Creatinine 0.6 0.6   EGFR >60.0 >60.0   Glucose 86 100   Calcium 9.3 9.1    Recent Labs   Lab 12/31/21  0356 12/24/21  1838   Alkaline Phosphatase 60 68   Bilirubin, Total 0.3 0.4   Bilirubin Direct 0.2  --    Protein, Total 6.5 7.1   Albumin 2.9* 3.6   ALT 32 67*   AST (SGOT) 20 35            Microbiology Results (last 15 days)       Procedure Component Value Units Date/Time    Culture + Gram Stain,Aerobic, CSF [[id number]] Collected: 12/24/21 2232    Order Status: Completed Specimen: Cerebrospinal Fluid from CSF (Lumbar Puncture Spinal Fluid) Updated: 12/30/21 1508    Narrative:       ORDER#: [id number]                                    ORDERED BY: PATITUCCI, LAUR  SOURCE: CSF (Lumbar Puncture Spinal Fluid) lumbar spiCOLLECTED:  12/24/21 22:32  ANTIBIOTICS AT COLL.:                                RECEIVED :  12/27/21 18:51  Stain, Gram                                FINAL       12/27/21 20:56  12/27/21   No WBCs seen             No organisms seen             Stain performed on Cytospin (concentrated) specimen  Culture  and Gram Stain, Aerobic, CSF       PRELIM      12/30/21 15:08  12/28/21   Culture no growth to date, Final report to follow  12/29/21   Culture no growth to date, Final report to follow  12/30/21   Culture no growth to date, Final report to follow      CSF Meningitis/Encephalitis Pathogen Panel PCR [[id number]] Collected: 12/24/21 2232    Order Status: Completed Specimen: CSF (Lumbar Puncture Spinal Fluid) Updated: 12/25/21 1110     CSF Eschericia coli K1 by PCR Not Detected     CSF Haemophilus influenza by PCR Not Detected     CSF Listeria monocytogenes by PCR Not Detected     CSF Neisseria meningitidis (encapsulated) by PCR Not Detected     CSF Streptococcus agalactiae by PCR Not Detected     CSF Streptococcus pneumoniae by PCR Not Detected     CSF Cytomegalovirus by PCR Not Detected     CSF Enterovirus by PCR Not Detected     CSF Herpes simplex virus 1 by PCR Not Detected     CSF Herpes simplex virus 2 by PCR Not Detected     CSF Human herpesvirus 6 by PCR Not Detected     CSF Human parechovirus by PCR Not Detected     CSF Varicella zoster virus by PCR Not Detected     CSF Cryptococcus neoformans/gattii by PCR Not Detected     Comment: Test performed with the FilmArray Meningitis/Encephalitis (ME)  PCR Panel. A negative result does not rule out central nervous  system infection and results from this panel are not intended  to be used as the sole basis for diagnosis, treatment, or  other patient management decisions. Consider pathogen specific  testing if FilmArray ME is  negative but clinical suspicion is  high for a particular infection. False negative results could  be due to the presence of strains with genetic variability in  the target regions,concentration of pathogen nucleic acid below  the limit of detection, or improper specimen handling.  This test has not been specifically evaluated with CSF from  immunocompromised patients and this test may be affected by  concurrent antimicrobial therapy. Positive results may be due  to detection of non-viable organism. Clinical correlation is  required. False positives may result from contamination during  specimen collection or processing. This test should not be  used for monitoring treatment of infection.         Narrative:      Indication for Meningitis/Encephalitis Panel PCR  Testing:->Very high clinical concern for infectious  encephalitis    COVID-19 (SARS-CoV-2) only ([facility]iat Rapid) asymptomatic admission [[id number]] Collected: 12/24/21 2057    Order Status: Completed Specimen: Nasopharyngeal Updated: 12/24/21 2136     Purpose of COVID testing Screening     SARS-CoV-2 Specimen Source Nasal Swab     SARS CoV 2 Overall Result Not Detected     Comment: __________________________________________________  -A result of "Detected" indicates POSITIVE for the    presence of SARS CoV-2 RNA  -A result of "Not Detected" indicates NEGATIVE for the    presence of SARS CoV-2 RNA  __________________________________________________________  Test performed using the Roche cobas Liat SARS-CoV-2 assay. This assay is  only for use under the Food and Drug Administration's Emergency Use  Authorization. This is a real-time RT-PCR assay for the qualitative  detection of SARS-CoV-2 RNA. Viral nucleic acids may persist in vivo,  independent of viability. Detection of viral nucleic  acid does not imply the  presence of infectious virus, or that virus nucleic acid is the cause of  clinical symptoms. Negative results do not preclude SARS-CoV-2 infection  and  should not be used as the sole basis for diagnosis, treatment or other  patient management decisions. Negative results must be combined with  clinical observations, patient history, and/or epidemiological information.  Invalid results may be due to inhibiting substances in the specimen and  recollection should occur. Please see Fact Sheets for patients and providers  located:  WirelessDSLBlog.no         Narrative:      o Collect and clearly label specimen type:  o PREFERRED-Upper respiratory specimen: One Nasal Swab in  Transport Media.  o Hand deliver to laboratory ASAP  Indication for testing->Extended care facility admission to  semi private room  Screening            Imaging personally reviewed, including:  Radiology Results (24 Hour)       Procedure Component Value Units Date/Time    MRI Femur Left W WO Contrast [[id number]] Collected: 12/30/21 1406    Order Status: Completed Updated: 12/30/21 1524    Narrative:      HISTORY: Pain in the right femur; pain in the left femur; evaluate for  necrotizing or nonnecrotizing myopathy; history of recent  rhabdomyolysis; weakness likely secondary to myopathy of unknown  etiology    COMPARISON: None.    TECHNIQUE: MRI of the right femur performed on a 1.5T scanner without  and with 15 mL of Clariscan intravenous contrast.     MRI of the left femur performed on a 1.5 T scanner without and with 15  mL of Clariscan intravenous contrast.    FINDINGS:  Bone: The bone and bone marrow are normal. There is no evidence of  fracture. No abnormal marrow or periosteal edema is seen.    Muscles/Tendons: There is mild edema noted in the posterior compartment  muscles, including within the biceps femoris muscle and semimembranosus  muscle, noted to be bilateral and symmetric in appearance. There is mild  atrophy of the semimembranosus muscle and biceps femoris muscles.    Mild symmetric edema is noted in the adductor longus and proximal  adductor magnus  muscles bilaterally adjacent to the level of the  symphysis pubis.    There is suggestion of very mild edema noted within the anterior  compartment muscles bilaterally, slightly hyperintense relative to the  bursal/sartorius muscles. Subtle enhancement is seen on the postcontrast  images.    On the postcontrast images, there is mild enhancement in the region of  muscle edema in the region of the semimembranosus and biceps femoris  muscles bilaterally.    Other: No mass lesion or fluid collection is identified.      Impression:          1. Mild edema and atrophy involving the semimembranosus and biceps  femoris muscles of the right and left thighs, noted to be bilateral and  symmetric in appearance. Mild enhancement on the postcontrast images.  Very mild edema in the anterior compartment muscles of the right and  left thighs, with mild enhancement. Mild atrophy of the semimembranosus  and biceps femoris muscles bilaterally.    These findings are nonspecific, and may be seen in the setting of muscle  denervation or myositis.    2. No atrophy involving the anterior compartment muscles.    3. Mild edema in the proximal adductor longus and adductor magnus  muscles  bilaterally.     4. No areas of nonenhancement to suggest muscle necrosis.    [name] Birks, MD  12/30/2021 3:22 PM    MRI Femur Right W WO Contrast [[id number]] Collected: 12/30/21 1406    Order Status: Completed Updated: 12/30/21 1524    Narrative:      HISTORY: Pain in the right femur; pain in the left femur; evaluate for  necrotizing or nonnecrotizing myopathy; history of recent  rhabdomyolysis; weakness likely secondary to myopathy of unknown  etiology    COMPARISON: None.    TECHNIQUE: MRI of the right femur performed on a 1.5T scanner without  and with 15 mL of Clariscan intravenous contrast.     MRI of the left femur performed on a 1.5 T scanner without and with 15  mL of Clariscan intravenous contrast.    FINDINGS:  Bone: The bone and bone marrow are  normal. There is no evidence of  fracture. No abnormal marrow or periosteal edema is seen.    Muscles/Tendons: There is mild edema noted in the posterior compartment  muscles, including within the biceps femoris muscle and semimembranosus  muscle, noted to be bilateral and symmetric in appearance. There is mild  atrophy of the semimembranosus muscle and biceps femoris muscles.    Mild symmetric edema is noted in the adductor longus and proximal  adductor magnus muscles bilaterally adjacent to the level of the  symphysis pubis.    There is suggestion of very mild edema noted within the anterior  compartment muscles bilaterally, slightly hyperintense relative to the  bursal/sartorius muscles. Subtle enhancement is seen on the postcontrast  images.    On the postcontrast images, there is mild enhancement in the region of  muscle edema in the region of the semimembranosus and biceps femoris  muscles bilaterally.    Other: No mass lesion or fluid collection is identified.      Impression:          1. Mild edema and atrophy involving the semimembranosus and biceps  femoris muscles of the right and left thighs, noted to be bilateral and  symmetric in appearance. Mild enhancement on the postcontrast images.  Very mild edema in the anterior compartment muscles of the right and  left thighs, with mild enhancement. Mild atrophy of the semimembranosus  and biceps femoris muscles bilaterally.    These findings are nonspecific, and may be seen in the setting of muscle  denervation or myositis.    2. No atrophy involving the anterior compartment muscles.    3. Mild edema in the proximal adductor longus and adductor magnus  muscles bilaterally.     4. No areas of nonenhancement to suggest muscle necrosis.    [name] Birks, MD  12/30/2021 3:22 PM          ECHO  Summary    * Left ventricular systolic function is mildly decreased with an ejection  fraction by Biplane Method of Discs of  43 %.    * Mild global hypokinesis.    * There is  mild aortic regurgitation.    * There is a venous catheter visualized in the right atrium.    Safety Checklist:     DVT prophylaxis:  CHEST guideline (See page [id number]) Chemical and Mechanical   Foley:  [facility] Rn Foley protocol Not present   IVs:  Peripheral IV   PT/OT: Ordered   Daily CBC & or Chem ordered:  SHM/ABIM guidelines (see #5) Yes, due to clinical and lab instability  Reference for approximate charges of common labs: CBC auto diff - $76  BMP - $99  Mg - $79    Lines:          Patient Lines/Drains/Airways Status       Active PICC Line / CVC Line / PIV Line / Drain / Airway / Intraosseous Line / Epidural Line / ART Line / Line / Wound / Pressure Ulcer / NG/OG Tube       Name Placement date Placement time Site Days    Peripheral IV 12/25/21 20 G Standard Right Antecubital 12/25/21  0600  Antecubital  1                     Disposition:   Today's date: 12/31/2021  Length of Stay: 6  Anticipated medical stability for discharge: March,  8 - Afternoon  Reason for ongoing hospitalization: Generalized weakness  Anticipated discharge needs: Acute rehab    Signed by: Frederich Cha, DO

## 2022-01-01 ENCOUNTER — Inpatient Hospital Stay: Payer: 59

## 2022-01-01 LAB — HEPATITIS PANEL, ACUTE
Hep A IgM: NONREACTIVE
Hepatitis B Core IgM: NONREACTIVE
Hepatitis B Surface Antigen: NONREACTIVE
Hepatitis C, AB: NONREACTIVE

## 2022-01-01 LAB — CBC
Absolute NRBC: 0 10*3/uL (ref 0.00–0.00)
Hematocrit: 26.2 % — ABNORMAL LOW (ref 34.7–43.7)
Hgb: 8.1 g/dL — ABNORMAL LOW (ref 11.4–14.8)
MCH: 24.7 pg — ABNORMAL LOW (ref 25.1–33.5)
MCHC: 30.9 g/dL — ABNORMAL LOW (ref 31.5–35.8)
MCV: 79.9 fL (ref 78.0–96.0)
MPV: 8.7 fL — ABNORMAL LOW (ref 8.9–12.5)
Nucleated RBC: 0 /100 WBC (ref 0.0–0.0)
Platelets: 369 10*3/uL — ABNORMAL HIGH (ref 142–346)
RBC: 3.28 10*6/uL — ABNORMAL LOW (ref 3.90–5.10)
RDW: 16 % — ABNORMAL HIGH (ref 11–15)
WBC: 5.27 10*3/uL (ref 3.10–9.50)

## 2022-01-01 LAB — BASIC METABOLIC PANEL
Anion Gap: 15 (ref 5.0–15.0)
BUN: 9 mg/dL (ref 7.0–21.0)
CO2: 18 mEq/L (ref 17–29)
Calcium: 9.2 mg/dL (ref 8.5–10.5)
Chloride: 100 mEq/L (ref 99–111)
Creatinine: 0.6 mg/dL (ref 0.4–1.0)
Glucose: 111 mg/dL — ABNORMAL HIGH (ref 70–100)
Potassium: 4.3 mEq/L (ref 3.5–5.3)
Sodium: 133 mEq/L — ABNORMAL LOW (ref 135–145)

## 2022-01-01 LAB — GFR: EGFR: >60

## 2022-01-01 MED ORDER — MEGESTROL ACETATE 400 MG/10ML PO SUSP
400.0000 mg | Freq: Two times a day (BID) | ORAL | Status: DC
Start: 2022-01-01 — End: 2022-01-03
  Administered 2022-01-01 – 2022-01-03 (×4): 400 mg via ORAL
  Filled 2022-01-01 (×8): qty 10

## 2022-01-01 MED ORDER — IOHEXOL 350 MG/ML IV SOLN
100.0000 mL | Freq: Once | INTRAVENOUS | Status: AC | PRN
Start: 2022-01-01 — End: 2022-01-01
  Administered 2022-01-01: 100 mL via INTRAVENOUS

## 2022-01-01 NOTE — Nursing Progress Note (Addendum)
NURSING PROGRESS NOTE    Patient Name: [name] (66 y.o. female)  Admission Date: 12/25/2021 [facility] ([place]) Day 7)    Shift Note:     Pt is alert and oriented x4  FC, MAE  Decreased sensation on bilateral left extremities  PRN Dilaudid given for pain x2  CTA done   Fall and safety precautions in place  Purposeful hourly rounding complete   Recent Labs   Lab 01/01/22  0353   Sodium 133*   Potassium 4.3   Chloride 100   CO2 18   BUN 9.0   Creatinine 0.6   EGFR >60.0   Glucose 111*   Calcium 9.2       Recent Labs   Lab 01/01/22  0353   WBC 5.27   Hgb 8.1*   Hematocrit 26.2*   Platelets 369*         Patient Lines/Drains/Airways Status       Active Lines, Drains and Airways       Name Placement date Placement time Site Days    Temporary Catheter with Pigtail 12/28/21 Internal Jugular Right 12/28/21  1015  --  3    Peripheral IV 12/25/21 20 G Standard Right Antecubital 12/25/21  0600  Antecubital  7    Urethral Catheter Non-latex 12 Fr. 12/31/21  1530  Non-latex  less than 1                    MEWS Score: 3    Last BM: 3/11  Pending Orders: N/A  Discharge Plan: TBD    Safety Checklist   Fall Precautions Y   Avasys N   Seizure Precautions N   Aspiration Precautions Y   Belongings Checked Y     Service Essentials  Medication Teaching Y   Purposeful Hourly Rounding Y       Interpreter Services:  Does the patient require an Interpreter? N    If yes, what form of interpreter services was used? N/A     If family was utilized, is interpreter waiver form signed and in the chart? N/A

## 2022-01-01 NOTE — Plan of Care (Signed)
NURSING PROGRESS NOTE    Patient Name: [name] (66 y.o. female)  Admission Date: 12/25/2021 [facility] Day 7)    Shift Note: Patient is A & O x4, FC, MAE with strength of 4/5 BUE and 2/5 BLE, clear speech.  PRN dilaudid administered x 2 for pain with effect/ benadryl given- pt stated tongue felt thick and itchy ( MD notified).  CHG bath completed as per protocol, and R IJ catheter removed by Ashok Cordia- charge nurse.  Pt consult completed- Pls refer to noted for details.  RA, clear/ diminished lung sounds.  Consistent carbs diet, thin liquids, whole pills.  Bleeding, falls and all safety precautions in place. Purposeful hourly rounding done.     Recent Labs   Lab 01/01/22  0353   Sodium 133*   Potassium 4.3   Chloride 100   CO2 18   BUN 9.0   Creatinine 0.6   EGFR >60.0   Glucose 111*   Calcium 9.2       Recent Labs   Lab 01/01/22  0353   WBC 5.27   Hgb 8.1*   Hematocrit 26.2*   Platelets 369*         Patient Lines/Drains/Airways Status       Active Lines, Drains and Airways       Name Placement date Placement time Site Days    Peripheral IV 12/25/21 20 G Standard Right Antecubital 12/25/21  0600  Antecubital  7    Urethral Catheter Non-latex 12 Fr. 12/31/21  1530  Non-latex  1                    MEWS Score: 3    Last BM: 03/11 x2  Pending Orders: NA  Discharge Plan: TBD    Safety Checklist   Fall Precautions Y   Avasys N   Seizure Precautions N   Aspiration Precautions N   Belongings Checked Y     Service Essentials  Medication Teaching Y   Purposeful Hourly Rounding Y       Interpreter Services:  Does the patient require an Interpreter? N    If yes, what form of interpreter services was used? NA     If family was utilized, is interpreter waiver form signed and in the chart? NA         Problem: Moderate/High Fall Risk Score >5  Goal: Patient will remain free of falls  Outcome: Progressing  Flowsheets (Taken 01/01/2022 0658 by Tonye Becket, RN)  High (Greater than 13):   HIGH-Visual cue at entrance to patient's room    HIGH-Utilize chair pad alarm for patient while in the chair   HIGH-Consider use of low bed   HIGH-Bed alarm on at all times while patient in bed   HIGH-Apply yellow "Fall Risk" arm band   HIGH-Initiate use of floor mats as appropriate     Problem: Compromised Tissue integrity  Goal: Damaged tissue is healing and protected  Outcome: Progressing  Flowsheets (Taken 12/28/2021 1502 by Henrine Screws, RN)  Damaged tissue is healing and protected:   Monitor/assess Braden scale every shift   Provide wound care per wound care algorithm   Reposition patient every 2 hours and as needed unless able to reposition self   Increase activity as tolerated/progressive mobility   Avoid shearing injuries   Relieve pressure to bony prominences for patients at moderate and high risk   Use bath wipes, not soap and water, for daily bathing   Keep intact skin clean  and dry   Use incontinence wipes for cleaning urine, stool and caustic drainage. Foley care as needed   Monitor external devices/tubes for correct placement to prevent pressure, friction and shearing   Utilize specialty bed  Goal: Nutritional status is improving  Outcome: Progressing  Flowsheets (Taken 12/30/2021 0137 by Jarold Song, RN)  Nutritional status is improving: Allow adequate time for meals     Problem: Bladder/Voiding  Goal: Perineal skin integrity is maintained or improved  Outcome: Progressing  Flowsheets (Taken 12/27/2021 1334 by Henrine Screws, RN)  Perineal skin integrity is maintained or improved:   Keep intact skin clean and dry   Use protective skin barriers to decrease potential skin breakdown   Consult/collaborate with Wound Care Nurse  Goal: Free from infection  Outcome: Progressing  Flowsheets (Taken 01/01/2022 1637)  Free from infection: Monitor/assess for signs and symptoms of infection     Problem: Neurological Deficit  Goal: Neurological status is stable or improving  Outcome: Progressing  Flowsheets (Taken 01/01/2022 0658 by Tonye Becket, RN)  Neurological  status is stable or improving:   Monitor/assess/document neurological assessment (Stroke: every 4 hours)   Perform CAM Assessment     Problem: Nutrition  Goal: Nutritional intake is adequate  Outcome: Progressing  Flowsheets (Taken 01/01/2022 1637)  Nutritional intake is adequate: Allow adequate time for meals     Problem: Pain interferes with ability to perform ADL  Goal: Pain at adequate level as identified by patient  Outcome: Progressing  Flowsheets (Taken 01/01/2022 1637)  Pain at adequate level as identified by patient:   Assess pain on admission, during daily assessment and/or before any "as needed" intervention(s)   Reassess pain within 30-60 minutes of any procedure/intervention, per Pain Assessment, Intervention, Reassessment (AIR) Cycle

## 2022-01-01 NOTE — Plan of Care (Signed)
Problem: Moderate/High Fall Risk Score >5  Goal: Patient will remain free of falls  Outcome: Progressing  Flowsheets (Taken 01/01/2022 0658)  High (Greater than 13):   HIGH-Visual cue at entrance to patient's room   HIGH-Utilize chair pad alarm for patient while in the chair   HIGH-Consider use of low bed   HIGH-Bed alarm on at all times while patient in bed   HIGH-Apply yellow "Fall Risk" arm band   HIGH-Initiate use of floor mats as appropriate     Problem: Neurological Deficit  Goal: Neurological status is stable or improving  Outcome: Progressing  Flowsheets (Taken 01/01/2022 0658)  Neurological status is stable or improving:   Monitor/assess/document neurological assessment (Stroke: every 4 hours)   Perform CAM Assessment     Problem: Pain interferes with ability to perform ADL  Goal: Pain at adequate level as identified by patient  Outcome: Progressing  Flowsheets (Taken 01/01/2022 0658)  Pain at adequate level as identified by patient:   Identify patient comfort function goal   Assess for risk of opioid induced respiratory depression, including snoring/sleep apnea. Alert healthcare team of risk factors identified.   Reassess pain within 30-60 minutes of any procedure/intervention, per Pain Assessment, Intervention, Reassessment (AIR) Cycle   Evaluate if patient comfort function goal is met   Evaluate patient's satisfaction with pain management progress   Offer non-pharmacological pain management interventions   Include patient/patient care companion in decisions related to pain management as needed

## 2022-01-01 NOTE — Progress Notes (Signed)
[facility]   [facility]GRESS NOTE    Today's date & time: 01/01/22 4:36 PM  Patient Name: [name]  Attending Physician: Frederich Cha, DO  Admission Date: 12/25/2021    Assessment:     Active Hospital Problems    Diagnosis    Hypokalemia    Thoracic back pain    Gastroparesis    Weakness of both legs    Guillain Barr syndrome    Type 2 diabetes mellitus    Paroxysmal atrial fibrillation     Plan:   Difficulty walking and bilateral leg and hand weakness - likely from statin induced myositis vs inflammatory myositis vs immune mediated myostitis. patient to has multiple vague complaints who was transferred from outside hospital for consideration of EMG for [name] consideration, and possible IVIG, currently seen by neurology team here and based on her history started the patient on thiamine IV, at this time we will continue to perform EMG, possible IVIG, LP was done and came back unremarkable, got seen by PT and OT team who recommended going to acute rehab, fall precaution, continue monitoring.  Patient had NIF and VC done on 3/5 which showed low volume.  Patient continued proximal muscle weakness, areflexia, highly suspicious for GBS and neurology team started the patient on IVIG on 12/27/2021, after the start of IVIG within the first 10 minutes patient developed numbness on the upper lip on the right side and tongue numbness, was that it was discontinued and was placed as allergy, following that IgG antibodies were ordered, IVIG antibodies ordered, stroke team recommended placing dialysis catheter and doing plasma exchange, pathology team was contacted, patient says every day she is feeling better, currently awaiting the start of plasma exchange.  Patient also had a EMG done on 12/27/2021, based on that CT scan of the abdomen and pelvis was done which showed no hematoma.   MRI L and R thigh or femur W WO   Mild edema and atrophy involving the semimembranosus and biceps  femoris muscles of  the right and left thighs, noted to be bilateral and  symmetric in appearance. These findings are nonspecific, and may be seen in the setting of muscle denervation or myositis.  Mild edema in the proximal adductor longus and adductor magnus muscles bilaterally.   No areas of nonenhancement to suggest muscle necrosis.  LDH 450, CPK 84, LFTs, aldolase, viral hepatitis panel negative  Discontinue HD catheter   Appreciate input from neurology team.   Schedule outpatient muscle biopsy   Vitamin D deficiency - repleted.   Zinc deficiency - repleted  B6 deficiency - repleted  Iron deficiency anemia and AICD - eval for Celiac disease  Upper abdominal and mid back pain - patient complaining of having some pain in the epigastric area banding leg to the mid back, she is placed on PPI, Tums, muscle relaxants, as needed pain medications, may consider placing the patient on scheduled Tylenol, will continue monitoring.  Pain is getting better by the day .   Recommend outpatient GI evaluation for malabsorption disorder given vitamin and mineral deficiencies.   Poor appetite/malnourished - nutrition consult.  CT Chest/Abd/Pelvis - no malignancy noted.    Urinary retention - tamsulosin 0.4mg  po daily.  Foley catheter placed.    Paroxysmal atrial fibrillation/tachycardia - rate is controlled, patient is on apixaban.  Metoprolol succcinate increased to 50mg  po daily .  CTA chest negative for PE, lung masses, consolidations, or lymphadenopathy.    Cardiomyopathy   ECHO Summary    *  Left ventricular systolic function is mildly decreased with an ejection  fraction by Biplane Method of Discs of  43 %.    * Mild global hypokinesis.    * There is mild aortic regurgitation.    * There is a venous catheter visualized in the right atrium.  Will need an ischemic work up after her LE weakness improves  Appreciate [facility] cardiology input  Hypertension - blood pressure is controlled, continue monitoring.   COPD - noted, patient is getting  Symbicort, in no acute respiratory distress, on oxygen.   Type 2 diabetes mellitus - borderline hemoglobin A1c in the past 2020 at 6.0, currently no treatment, hemoglobin A1c 5.3%.   Hypokalemia - replaced, monitor closely, magnesium level is also lower, replaced magnesium, now  started BID potassium ordered.   Anemia - most likely poor nutrition and chronic disease, will check iron profile, consider iron infusion.  Multivitamins added.   Bipolar disorder - patient seem to have some issues with behavior, patient is on nortriptyline and Latuda, no acute exacerbation, continue monitoring.   Constipation - patient said last bowel meant a few days ago, stool softeners and laxatives given, had multiple bowel movements on the evening of 12/27/2021, continue monitoring    GI prophylaxis: Protonix, Maalox  DVT ppx Apixaban on hold to remove HD catheter.      Spoke to patient's daughter over the phone and updated her about patient's condition    Subjective     CC: Weakness of both legs    Interval History/24 hour events: Patient say every day she is getting better, no shortness of breath, no chest pain, back pain seem to be getting better    HPI: HPI per Admitting Provider   " [name] is a 66 y.o. female hx afib on Eliquis, HTN, HLD, DM, COPD, TIAs, COPD, bipolar disorder, [facility] admit 2/7-2/17/23 for rhabdomyolysis (peak CK 4464 - statin d/c'ed) + vomiting ?gastroparesis + starvation ketoacidosis + UTI, [facility] admit 2/18-2/20/23 for rhabdomyolysis (peak CK 2509) + transaminitis + bilateral leg weakness + right paresthesias, [facility] ER visit 3/2 for bilateral leg weakness who presents to [facility] with difficulty standing and walking, bilateral leg/hand weakness, bilateral leg/hand/face paresthesias. She noted bilateral leg weakness with her prior [facility] admission 2/7-2/17 and was readmitted for bilateral leg weakness 2/18-2/20. The past 4 days she has had difficulty standing and walking. The past 4 days she notes new  bilateral hand and face paresthesias and bilateral hand weakness. She has some shortness of breath but is speaking in full sentences. She went to [facility] ER 3/2 and was discharged. She saw her PMD who advised she go to an [facility]. At [facility] she got MRI brain and CTL-spine which did not show a cause for her symptoms. [facility] called neurology Dr. Francesco Sor who said to consider IVIG for 5 days, LP, EMG. The LP was done at [facility]. Dr. Francesco Sor advised transfer to [facility] These symptoms are sudden onset, moderate intensity, without alleviating factors. "     Hospital course:   Following admission, patient was seen by neurology consult who performed MRI of the entire spine and brain, without contrast which came back to show no major issues except on the lumbar area L4-L5 disc disease with facet arthrosis and degenerative changes, neurology team recommended LP and CSF studies so far are unrevealing, patient continued to complain of having bandlike pain around the epigastric area & in the back, there was tenderness on the right side of the thoracic vertebral area in  the muscle, Lidoderm patch applied, patient is getting Protonix and Maalox, has not had a bowel movement for that reason given lactulose and stool softeners.  Patient was [facility] for consideration of IVIG with a suspicion of GBS, EMG currently pending.     Due to her history of not eating well and has been losing weight over the past few weeks, neurology team felt may be nutritional and started on on IV thiamine, patient has been requesting narcotics for the pain that she is got but not really having any tenderness on examination.  Patient did NIF and vital capacity on the morning of 12/26/2021 which came back -12 cm H2O and vital capacity 1.5 L.  Patient is not in any respiratory distress, not needing any oxygen at this time.  Patient had additional laboratory studies including HIV and syphilis which came back negative, negative for COVID 19,  potassium was noted to be low for which replacement was given, CPK slightly elevated at 239.  Cultures remain negative.     Patient was seen by PT and OT team who recommended going to acute rehab.  Patient was having constipation, stool softeners and laxatives given.     3/8: Pt feels better.  Diffuse pain improved.  LE weakness improved.    3/9: Patient seen and examined she was noted to have abdominal distention.  Patient reports poor appetite over the past 2 weeks.  She denies any history of bariatric surgery or malabsorption disorder.  3/10: Pt c/o shortness of breath and palpitations, but no chest pain.  Pt has remained tachycardic throughout her hospitalization.  Pt urinates, but she still retains urine requiring straight catheterization.        Review of Systems:   General - back pain   Resp - no SOB   CVS - no CP, no CAD   GI - no nausea, no diarrhea   CNS - no visual deficit, generalized  weakness    Physical Exam:     Vitals:    01/01/22 0925 01/01/22 0945 01/01/22 1252 01/01/22 1500   BP: 116/66 116/66 122/74 118/78   Pulse:  (!) 123 (!) 113    Resp:   16 18   Temp:   97.5 F (36.4 C) 97.7 F (36.5 C)   TempSrc:   Axillary Oral   SpO2:   100% 100%   Weight:       Height:              Intake/Output Summary (Last 24 hours) at 01/01/2022 1636  Last data filed at 01/01/2022 1316  Gross per 24 hour   Intake --   Output 3025 ml   Net -3025 ml       General: Awake, alert, oriented x 3; no acute distress.  HEENT: PERRLA, eomi, sclera anicteric, oropharynx clear without lesions, mucous membranes moist  Neck: supple, no lymphadenopathy, no thyromegaly, no JVD, no carotid bruits  Cardiovascular: regular rate and rhythm, no murmurs, rubs or gallops  Lungs: clear to auscultation bilaterally, without wheezing, rhonchi, or rales  Abdomen: soft, non-tender, suprapubic area distended; no palpable masses, no hepatosplenomegaly, normoactive bowel sounds, no rebound or guarding  Extremities: no clubbing, cyanosis, or  edema  Neuro: cranial nerves grossly intact, strength 5/5 in upper extremities except 3+/5 hand intrinsics, bilateral hip extension 2/5, bilateral knee extension 3/5, bilateral dorsiflexion 3/5, bilateral plantarflexion 4/5, paresthesias bilateral feet/hands/face but intact to light touch, could not elicit knee DTRs, follows commands.   Skin:  no rashes or lesions noted  GU: no CVA tenderness     Meds:   Medications were reviewed by me:  Current Facility-Administered Medications   Medication Dose Route Frequency    amLODIPine  2.5 mg Oral Daily    apixaban  5 mg Oral Q12H SCH    bisacodyl  5 mg Oral Daily    budesonide-formoterol  2 puff Inhalation BID    lidocaine  1 patch Transdermal Q24H    lurasidone  120 mg Oral Daily    megestrol  400 mg Oral BID Meals    metoprolol succinate XL  50 mg Oral Daily    nortriptyline  10 mg Oral QHS    pantoprazole  40 mg Oral Daily    potassium chloride  40 mEq Oral BID    rOPINIRole  1 mg Oral QHS    terazosin  2 mg Oral QHS    thiamine  100 mg Oral Daily    vitamin B-6  50 mg Oral Daily    vitamin D (cholecalciferol)  25 mcg Oral Daily    vitamins/minerals  1 tablet Oral Daily    zinc sulfate  220 mg Oral Daily       Labs:   Labs reviewed personally include:  Recent Labs   Lab 01/01/22  0353 12/31/21  0356   WBC 5.27 7.67   Hgb 8.1* 8.1*   Hematocrit 26.2* 26.2*   Platelets 369* 370*    Recent Labs   Lab 12/26/21  0536   PT 13.1*   PT INR 1.1   PTT 27       Recent Labs   Lab 01/01/22  0353 12/31/21  0356   Sodium 133* 134*   Potassium 4.3 3.8   Chloride 100 101   CO2 18 21   BUN 9.0 6.0*   Creatinine 0.6 0.6   EGFR >60.0 >60.0   Glucose 111* 86   Calcium 9.2 9.3    Recent Labs   Lab 12/31/21  0356   Alkaline Phosphatase 60   Bilirubin, Total 0.3   Bilirubin Direct 0.2   Protein, Total 6.5   Albumin 2.9*   ALT 32   AST (SGOT) 20            Microbiology Results (last 15 days)       Procedure Component Value Units Date/Time    COVID-19 (SARS-CoV-2) only (Liat Rapid) - Required by  Extended care facility discharge within 24 hours [[id number]] Collected: 12/31/21 1434    Order Status: Completed Specimen: Nasopharyngeal Updated: 12/31/21 1524     Purpose of COVID testing Screening     SARS-CoV-2 Specimen Source Nasal Swab     SARS CoV 2 Overall Result Not Detected     Comment: __________________________________________________  -A result of "Detected" indicates POSITIVE for the    presence of SARS CoV-2 RNA  -A result of "Not Detected" indicates NEGATIVE for the    presence of SARS CoV-2 RNA  __________________________________________________________  Test performed using the Roche cobas Liat SARS-CoV-2 assay. This assay is  only for use under the Food and Drug Administration's Emergency Use  Authorization. This is a real-time RT-PCR assay for the qualitative  detection of SARS-CoV-2 RNA. Viral nucleic acids may persist in vivo,  independent of viability. Detection of viral nucleic acid does not imply the  presence of infectious virus, or that virus nucleic acid is the cause of  clinical symptoms. Negative results do not preclude SARS-CoV-2 infection and  should not  be used as the sole basis for diagnosis, treatment or other  patient management decisions. Negative results must be combined with  clinical observations, patient history, and/or epidemiological information.  Invalid results may be due to inhibiting substances in the specimen and  recollection should occur. Please see Fact Sheets for patients and providers  located:  [email or web address]         Narrative:      o Collect and clearly label specimen type:  o PREFERRED-Upper respiratory specimen: One Nasal Swab in  Transport Media.  o Hand deliver to laboratory ASAP  Testing as required by extended care facility?->Yes  Screening    COVID-19 (SARS-CoV-2) only ([facility] Rapid) - Required by Extended care facility discharge within 24 hours [[id number]]  (Abnormal) Collected: 12/31/21 1241    Order Status: Completed  Specimen: Nasopharyngeal Updated: 12/31/21 1426     Purpose of COVID testing Screening     SARS-CoV-2 Specimen Source Nasal Swab     SARS CoV 2 Overall Result Invalid     Comment: TEST RERUN TWICE AND UNABLE TO OBTAIN RESULT .SPOKE TO [id number]  12/31/2021  14:26  __________________________________________________  -A result of "Detected" indicates POSITIVE for the    presence of SARS CoV-2 RNA  -A result of "Not Detected" indicates NEGATIVE for the    presence of SARS CoV-2 RNA  __________________________________________________________  Test performed using the Roche cobas Liat SARS-CoV-2 assay. This assay is  only for use under the Food and Drug Administration's Emergency Use  Authorization. This is a real-time RT-PCR assay for the qualitative  detection of SARS-CoV-2 RNA. Viral nucleic acids may persist in vivo,  independent of viability. Detection of viral nucleic acid does not imply the  presence of infectious virus, or that virus nucleic acid is the cause of  clinical symptoms. Negative results do not preclude SARS-CoV-2 infection and  should not be used as the sole basis for diagnosis, treatment or other  patient management decisions. Negative results must be combined with  clinical observations, patient history, and/or epidemiological information.  Invalid results may be due to inhibiting substances in the specimen and  recollection should occur. Please see Fact Sheets for patients and providers  located:  [email or web address]         Narrative:      o Collect and clearly label specimen type:  o PREFERRED-Upper respiratory specimen: One Nasal Swab in  Transport Media.  o Hand deliver to laboratory ASAP  Testing as required by extended care facility?->Yes  Screening    Culture + Gram Stain,Aerobic, CSF [[id number]] Collected: 12/24/21 2232    Order Status: Completed Specimen: Cerebrospinal Fluid from CSF (Lumbar Puncture Spinal Fluid) Updated: 01/01/22 1458    Narrative:      ORDER#:  [id number]                                    ORDERED BY: PATITUCCI, LAUR  SOURCE: CSF (Lumbar Puncture Spinal Fluid) lumbar spiCOLLECTED:  12/24/21 22:32  ANTIBIOTICS AT COLL.:                                RECEIVED :  12/27/21 18:51  Stain, Gram                                FINAL  12/27/21 20:56  12/27/21   No WBCs seen             No organisms seen             Stain performed on Cytospin (concentrated) specimen  Culture and Gram Stain, Aerobic, CSF       FINAL       01/01/22 14:58  01/01/22   No Growth      CSF Meningitis/Encephalitis Pathogen Panel PCR [[id number]] Collected: 12/24/21 2232    Order Status: Completed Specimen: CSF (Lumbar Puncture Spinal Fluid) Updated: 12/25/21 1110     CSF Eschericia coli K1 by PCR Not Detected     CSF Haemophilus influenza by PCR Not Detected     CSF Listeria monocytogenes by PCR Not Detected     CSF Neisseria meningitidis (encapsulated) by PCR Not Detected     CSF Streptococcus agalactiae by PCR Not Detected     CSF Streptococcus pneumoniae by PCR Not Detected     CSF Cytomegalovirus by PCR Not Detected     CSF Enterovirus by PCR Not Detected     CSF Herpes simplex virus 1 by PCR Not Detected     CSF Herpes simplex virus 2 by PCR Not Detected     CSF Human herpesvirus 6 by PCR Not Detected     CSF Human parechovirus by PCR Not Detected     CSF Varicella zoster virus by PCR Not Detected     CSF Cryptococcus neoformans/gattii by PCR Not Detected     Comment: Test performed with the Film[employer] Meningitis/Encephalitis (ME)  PCR Panel. A negative result does not rule out central nervous  system infection and results from this panel are not intended  to be used as the sole basis for diagnosis, treatment, or  other patient management decisions. Consider pathogen specific  testing if FilmArray ME is negative but clinical suspicion is  high for a particular infection. False negative results could  be due to the presence of strains with genetic variability in  the target  regions,concentration of pathogen nucleic acid below  the limit of detection, or improper specimen handling.  This test has not been specifically evaluated with CSF from  immunocompromised patients and this test may be affected by  concurrent antimicrobial therapy. Positive results may be due  to detection of non-viable organism. Clinical correlation is  required. False positives may result from contamination during  specimen collection or processing. This test should not be  used for monitoring treatment of infection.         Narrative:      Indication for Meningitis/Encephalitis Panel PCR  Testing:->Very high clinical concern for infectious  encephalitis    COVID-19 (SARS-CoV-2) only (Liat Rapid) asymptomatic admission [[id number]] Collected: 12/24/21 2057    Order Status: Completed Specimen: Nasopharyngeal Updated: 12/24/21 2136     Purpose of COVID testing Screening     SARS-CoV-2 Specimen Source Nasal Swab     SARS CoV 2 Overall Result Not Detected     Comment: __________________________________________________  -A result of "Detected" indicates POSITIVE for the    presence of SARS CoV-2 RNA  -A result of "Not Detected" indicates NEGATIVE for the    presence of SARS CoV-2 RNA  __________________________________________________________  Test performed using the Roche cobas Liat SARS-CoV-2 assay. This assay is  only for use under the Food and Drug Administration's Emergency Use  Authorization. This is a real-time RT-PCR assay for the qualitative  detection of SARS-CoV-2 RNA. Viral nucleic acids  may persist in vivo,  independent of viability. Detection of viral nucleic acid does not imply the  presence of infectious virus, or that virus nucleic acid is the cause of  clinical symptoms. Negative results do not preclude SARS-CoV-2 infection and  should not be used as the sole basis for diagnosis, treatment or other  patient management decisions. Negative results must be combined with  clinical observations, patient  history, and/or epidemiological information.  Invalid results may be due to inhibiting substances in the specimen and  recollection should occur. Please see Fact Sheets for patients and providers  located:  [email or web address].no         Narrative:      o Collect and clearly label specimen type:  o PREFERRED-Upper respiratory specimen: One Nasal Swab in  Transport Media.  o Hand deliver to laboratory ASAP  Indication for testing->Extended care facility admission to  semi private room  Screening            Imaging personally reviewed, including:  Radiology Results (24 Hour)       Procedure Component Value Units Date/Time    CT Angiogram Chest [[id number]] Collected: 01/01/22 0753    Order Status: Completed Updated: 01/01/22 0759    Narrative:      HISTORY: Tachycardia.    COMPARISON: CT chest without contrast 12/28/2021    TECHNIQUE: CTA chest WITH intravenous contrast. PE protocol. 100 mL IV  Omnipaque 350 was administered. 3D MIP images are submitted and  reviewed. The following dose reduction techniques were utilized:  Automated exposure control and/or adjustment of the mA and/or kV  according to patient size, and the use of iterative reconstruction  technique.    FINDINGS:     LINES/TUBES: None.    LUNGS: No consolidation, edema or mass.  PLEURA: No pleural effusions or pneumothorax.    HEART: Not enlarged. Trace anterior pericardial fluid.  MEDIASTINUM: No axillary, hilar or mediastinal lymphadenopathy.  PULMONARY ARTERIES: Limited evaluation of the subsegmental pulmonary  arteries due to respiratory motion. No pulmonary embolism is identified.  AORTA: Stable dilated ascending aorta measuring up to 4.1 cm. No aortic  dissection.    UPPER ABDOMEN: No acute findings.    BONES AND SOFT TISSUES: No suspicious or destructive osseous lesion.      Impression:        1.Limited evaluation of the subsegmental pulmonary arteries. No evidence  of pulmonary embolism given this limitation.  2.Dilated  ascending aorta measuring up to 4.1 cm.    Demetrios Isaacs, MD  01/01/2022 7:57 AM          ECHO  Summary    * Left ventricular systolic function is mildly decreased with an ejection  fraction by Biplane Method of Discs of  43 %.    * Mild global hypokinesis.    * There is mild aortic regurgitation.    * There is a venous catheter visualized in the right atrium.    Safety Checklist:     DVT prophylaxis:  CHEST guideline (See page [id number]) Chemical and Mechanical   Foley:  [facility] Rn Foley protocol Not present   IVs:  Peripheral IV   PT/OT: Ordered   Daily CBC & or Chem ordered:  SHM/ABIM guidelines (see #5) Yes, due to clinical and lab instability   Reference for approximate charges of common labs: CBC auto diff - $76  BMP - $99  Mg - $79    Lines:          Patient Lines/Drains/Airways Status  Active PICC Line / CVC Line / PIV Line / Drain / Airway / Intraosseous Line / Epidural Line / ART Line / Line / Wound / Pressure Ulcer / NG/OG Tube       Name Placement date Placement time Site Days    Peripheral IV 12/25/21 20 G Standard Right Antecubital 12/25/21  0600  Antecubital  1                     Disposition:   Today's date: 01/01/2022  Length of Stay: 7  Anticipated medical stability for discharge: March,  8 - Afternoon  Reason for ongoing hospitalization: Generalized weakness  Anticipated discharge needs: Acute rehab    Signed by: Frederich Cha, DO

## 2022-01-01 NOTE — Consults (Signed)
[facility]  NP/PA Spectra [phone] and [phone] I Answering service: [phone]  Patient of Dr. 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 Presented as below for weakness concerning for myositis. Echo was done which showed newly reduced EF of 43% with global hypokinesis  EKG on admission sinus tach. Etiology of newly reduced EF unclear but is non ischemic given Cath 2022 with no obstructive CAD     - no evidence of volume overload on exam  - GDMT: continue Toprol 75  - given recent LHC with no obstructive CAD no plans for further ischemic workup in house. Repeat echo as OP  - recommend Economy amlodipine to allow for addition of ACE/ARB with losartan 12.5 if SBP allows   - echo 01/01/22  EF 43 %. mild aortic regurgitation. There is a venous catheter visualized in the right atrium.    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   3/6 EMG showing a myopathic process affecting proximal bilateral lower limb muscles  MRI Thighs with Mild edema and atrophy involving the semimembranosus and biceps femoris muscles of the right and left thighs with mild enhancement.  Mild edema is noted in the anterior compartment muscles of the right and left thighs with mild enhancement.  In addition, mild edema is noted in the proximal abductor longus and abductor magnus muscles bilaterally.    - neurology following     Hx of CP possible Microvascular angina  Cardiac catheterization in 2014 in 2017 and 2022 negative for CAD. Echo 08/2021 EF: 55-60. Cardiac PET stress test  08/27/2019 was normal with SDS score of 1, MBFR 1.88, EF 54%. Diagnosis of possible microvascular angina being treated with Ranexa as OP (severe HA with nitrates).    - stable, denies chest pain    Paroxysmal Atrial fibrillation CHA2DS2-VASc: 5 s/p LINQ monitor and on Eliquis 5 mg BID  - continue Eliquis 5 BID and Toprol 50  - sinus tach on tele     Hx of Transaminitis   Diagnosed during ED visit on 12/11/2021. Thought to be 2/2 rhabdomyolysis. Statins were held. Viral hepatitis panel was negative. Right upper quadrant ultrasound normal.  - continue to hold statin    HTN  As above    AI   Mild to moderate aortic insufficiency by TEE in 2017 and 2D echo 08/2021. Continue to monitor.  Echo 12/2021 mild AI    HLD   Statins held due to history of transaminitis     Hx of Anemia  Hx of prior GI bleed( GI workup with a PillCam has shown small intestine bleeding,  But a repeat evaluation does not indicate any active bleeding and patient is back on Eliquis 5 b.i.d..)  -Hg ranging from 7.5 - 9 this admission. 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.0] 12/25/2021    History of asthma [Z87.09] 06/16/2019    History of transient ischemic attack (TIA) [Z86.73] 06/16/2019    HType 2 diabetes mellitus [E11.9] 06/16/2019    Syncope and collapse [R55] 06/16/2019    Left-sided weakness [R53.1] 05/20/2019    Chest pain with moderate risk for cardiac etiology [R07.9] 05/11/2019    Non-traumatic subcutaneous emphysema [J98.2] 01/07/2019    Chest pain with high risk of acute coronary syndrome [R07.9] 01/06/2019    HParoxysmal atrial fibrillation [I48.0] 01/06/2019    Hypertension [I10] 01/06/2019    Hyperlipidemia [E78.5] 01/06/2019    Seizure disorder [G40.909] 01/06/2019    History of gastrointestinal  hemorrhage [Z87.19] 01/06/2019    Depression [F32.A] 01/06/2019    Anemia [D64.9] 01/06/2019    Asthma [J45.909] 01/06/2019    Chest pain [R07.9] 08/22/2018    CVA (cerebral vascular accident) [I63.9] 08/21/2018       Chief Complaint:     weakness    History of Present Illness:   [name] is a 66 y.o. female Patient with history of afib, HFpEF , negative ischemic workup 2022 via LHC with no CAD. Presented for weakness concerning for myositis. Echo was done which showed newly reduced EF of 43% with global hypokinesis      Prior Cardiac History/Testing:       TTE 12/07/2021:  CONCLUSIONS     * Left ventricular systolic function is normal with an ejection fraction of   56 % by 2D Teichholz.     * Left ventricular chamber size is normal.     * Left ventricular diastolic function: normal.     * Right ventricular systolic function is normal with TAPSE measuring 2.95   cm.     * Right ventricular chamber dimension is normal.     * There is mild mitral valve regurgitation.     TTE 11/19/2021:  CONCLUSIONS     * Left ventricular systolic function is normal with an ejection fraction of   55 % by Simpson's biplane.     * Left ventricular chamber size is normal.     * Left ventricular diastolic function: Grade I diastolic dysfunction.     * Right ventricular systolic function is normal with TAPSE measuring 2.69   cm.     * Right ventricular chamber dimension is normal.     * There is mild diffuse thickening (sclerosis) of the aortic valve cusps.     * There is mild to moderate aortic valve regurgitation.     * Mitral valve has mild annular calcification.     * No pulmonary hypertension, estimated pulmonary arterial systolic pressure   is 26 mmHg.     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     * There is mild tricuspid valve regurgitation.      Heart catheterization 07/26/2021      1. Normal coronary arteries   2. Hypertension   3. PAF       PET/CT MPS 08/27/2019  1. This is a  normal PET myocardial perfusion study. PET MPI reveals normal myocardial perfusion with no evidence of ischemia or scar.  Summed difference score (SDS) is 1.   2. Left ventricular ejection fraction is calculated to be  54% with normal left ventricular function with no wall motion abnormalities at rest.   3. Blood flow quantification demonstrates a global myocardial blood flow reserve (MBFR) of 1.88.      Venous Doppler lower extremity 06/05/2019 no DVT and no evidence of reflux except left GSV with greater than half a 2nd reflux. Leg edema stable with elevation and compression stockings    Cardiac PET stress test 08/27/2019 was normal with SDS score of 1, MBFR 1.88, EF 54%..    blood from 01/11/2020 hematocrit 32.4, A1c 6.0, BUN /creatinine 18/0.9, K 4.3, LDL 57, HDL 62, TG 52 on Lipitor 40 q.d.    Venous Doppler of lower extremities 04/11/2020 no DVT, minor reflux in left GSV.      Venous Doppler of the lower extremities 11/14/2020 indicated no DVT, moderate bilateral reflux in the deep veins and GSV.   Going for venous ablation by vascular Department.   patient was admitted to [facility] 08/24/2021 with TIA like symptoms and the workup apparently was unremarkable.  Echo done there on 09/02/2021 indicated EF 55%.  Creatinine was initially elevated but after hydration went back to normal.   09/27/21 ER visit due to headache and nausea with elevated BP to 158/101. Potassium 2.7.  She notes she stopped taking Terazosin 2 mg daily 2 weeks ago and symptoms of nausea started with cessation of medication. BP at home has been 140s to 150s systolic.  She is to follow up with GI.  No blood in stool or emesis.  Tolerating orals.  Recommend resume Terazosin.  Monitor BP as well as BMP given hypokalemia in ER.      12/21/2021: Patient presents today for f/u s/p Ulen from admission for rhabdomyolysis and leg swelling.    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 Echo  08/2021 EF: 55-60. Cardiac PET stress test 08/27/2019 was normal with SDS score of 1, MBFR 1.88, EF 54%. Diagnosis of possible microvascular angina being treated with Ranexa (severe HA with nitrates). Cr: 0.5.      Past History:     Past Medical History:   Diagnosis Date    Anemia     Asthma     Atrial fibrillation     Chronic obstructive pulmonary disease     Convulsions     Depression     Diabetes mellitus     diet controlled     Hyperlipidemia     Hypertension     TIA (transient ischemic attack)       Past Surgical History:   Procedure Laterality Date    CARDIAC CATHETERIZATION  2017    HYSTERECTOMY      pci      TRIPLE LUMEN DIALYSIS CATH N/A 12/28/2021    Procedure: TRIPLE LUMEN DIALYSIS CATH;  Surgeon: Tamala Bari, MD;  Location: [facility] CARDIAC CATH;  Service: Interventional Radiology;  Laterality: N/A;      Medications Prior to Admission   Medication Sig Dispense Refill Last Dose    acetaminophen (TYLENOL) 325 MG tablet Take 2 tablets (650 mg total) by mouth every 4 (four) hours as needed for Pain 50 tablet 0     albuterol (PROVENTIL HFA) 108 (90 Base) MCG/ACT inhaler Inhale 1 puff into the lungs daily Also take it PRN every 20 minutes.       amLODIPine (NORVASC) 2.5 MG tablet Take 1 tablet (2.5 mg total) by mouth daily 30 tablet 0     apixaban (  ELIQUIS) 5 MG Take 1 tablet (5 mg total) by mouth every 12 (twelve) hours 30 tablet 0     atorvastatin (LIPITOR) 40 MG tablet Take 40 mg by mouth daily       budesonide-formoterol (SYMBICORT) 80-4.5 MCG/ACT inhaler Inhale 2 puffs into the lungs daily       butalbital-acetaminophen-caffeine (FIORICET) 50-325-40 MG per tablet Take 1 tablet by mouth every 6 (six) hours as needed for Headaches 5 tablet 0     furosemide (LASIX) 20 MG tablet Take 1 tablet (20 mg total) by mouth daily (Patient taking differently: Take 20 mg by mouth 2 (two) times daily Patient states she takes lasix every other day) 30 tablet 0 Unknown    furosemide (LASIX) 40 MG tablet Take 40 mg by mouth 2 (two)  times daily   Unknown    lamoTRIgine (LAMICTAL) 150 MG tablet Take 1 tablet (150 mg total) by mouth 2 (two) times daily (Patient taking differently: Take 150 mg by mouth daily   ) 60 tablet 0     Latuda 120 MG Tab Take 120 mg by mouth daily       LORazepam (Ativan) 0.5 MG tablet Take 0.5 mg by mouth daily as needed for Anxiety       Lurasidone HCl (Latuda) 40 MG Tab Take 40 mg by mouth nightly       metoprolol succinate XL (TOPROL-XL) 25 MG 24 hr tablet Take 25 mg by mouth daily       Motegrity 2 MG Tab Take 2 mg by mouth daily       Ozempic, 1 MG/DOSE, 4 MG/3ML Solution Pen-injector        pantoprazole (PROTONIX) 40 MG tablet Take 1 tablet (40 mg total) by mouth daily 30 tablet 0     QUEtiapine (SEROQUEL) 50 MG tablet Take 1 tablet (50 mg total) by mouth nightly 30 tablet 0     rOPINIRole (REQUIP) 1 MG tablet Take 1 tablet (1 mg total) by mouth nightly 30 tablet 0     terazosin (HYTRIN) 2 MG capsule Take 1 capsule (2 mg total) by mouth nightly 30 capsule 1     Trintellix 10 MG Tab tablet Take 10 mg by mouth daily        Allergies   Allergen Reactions    Singulair [Montelukast] Itching    Immune Globulin (Human) Itching and Facial Swelling     Pt complain of itchiness and swelling of lips and tongue. Saturation fine, bp elevated.    Myoview [Technetium-75m] Itching    Contrast [Iodinated Contrast Media]     Latex     Nitroglycerin     Penicillins     Tetracyclines & Related       Current Facility-Administered Medications   Medication Dose Route Frequency Provider Last Rate Last Admin    acetaminophen (TYLENOL) tablet 650 mg  650 mg Oral Q6H PRN Sheran Luz D, MD   650 mg at 12/31/21 1610    Or    acetaminophen (TYLENOL) suppository 650 mg  650 mg Rectal Q6H PRN Sheran Luz D, MD        albuterol sulfate HFA (PROVENTIL) inhaler 2 puff  2 puff Inhalation Q6H PRN Emeline Darling, MD        alum & mag hydroxide-simethicone (MAALOX PLUS) 200-200-20 mg/5 mL suspension 30 mL  30 mL Oral Q4H PRN Ashiny, Zelalem A, MD         amLODIPine (NORVASC) tablet 2.5 mg  2.5 mg Oral  Daily Sheran Luz D, MD   2.5 mg at 01/01/22 0925    apixaban (ELIQUIS) tablet 5 mg  5 mg Oral Q12H [facility] Sheran Luz D, MD   5 mg at 01/01/22 2001    bisacodyl (DULCOLAX) EC tablet 5 mg  5 mg Oral Daily Ashiny, Zelalem A, MD   5 mg at 12/27/21 0834    budesonide-formoterol (SYMBICORT) 80-4.5 MCG/ACT inhaler 2 puff  2 puff Inhalation BID Ashiny, Zelalem A, MD   2 puff at 12/31/21 2102    calcium carbonate (TUMS) chewable tablet 1,000 mg  1,000 mg Oral Q6H PRN Sheran Luz D, MD        diphenhydrAMINE (BENADRYL) injection 25 mg  25 mg Intravenous Q6H PRN Frederich Cha, DO        diphenhydrAMINE (BENADRYL) injection 6.25 mg  6.25 mg Intravenous Q6H PRN Ashiny, Zelalem A, MD   6.25 mg at 01/01/22 1221    gabapentin (NEURONTIN) capsule 100 mg  100 mg Oral TID PRN Sheran Luz D, MD   100 mg at 12/29/21 0912    hydrALAZINE (APRESOLINE) injection 10 mg  10 mg Intravenous Q2H PRN Sheran Luz D, MD   10 mg at 12/29/21 1951    HYDROmorphone (DILAUDID) tablet 2 mg  2 mg Oral Q4H PRN Sheran Luz D, MD   2 mg at 01/01/22 2107    labetalol (NORMODYNE,TRANDATE) injection 10 mg  10 mg Intravenous Q2H PRN Sheran Luz D, MD        lidocaine (LIDODERM) 5 % 1 patch  1 patch Transdermal Q24H Ashiny, Zelalem A, MD   1 patch at 01/01/22 0949    lurasidone (LATUDA) tablet 120 mg  120 mg Oral Daily Sheran Luz D, MD   120 mg at 01/01/22 0347    magnesium sulfate 1g in dextrose 5% IVPB (premix)  1 g Intravenous PRN Sheran Luz D, MD        megestrol acetate (MEGACE) 400 MG/10ML oral suspension 400 mg  400 mg Oral BID Meals Boykins, [place], DO   400 mg at 01/01/22 1748    melatonin tablet 3 mg  3 mg Oral QHS PRN Sheran Luz D, MD   3 mg at 12/30/21 2131    metoprolol succinate XL (TOPROL-XL) 24 hr tablet 50 mg  50 mg Oral Daily Cyndie Chime, [place], DO   50 mg at 01/01/22 0945    naloxone (NARCAN) injection 0.2 mg  0.2 mg Intravenous PRN Sheran Luz D, MD        nortriptyline (PAMELOR) capsule 10 mg  10  mg Oral QHS Ashiny, Zelalem A, MD   10 mg at 01/01/22 2236    ondansetron (ZOFRAN-ODT) disintegrating tablet 4 mg  4 mg Oral Q6H PRN Sheran Luz D, MD        Or    ondansetron [facility]) injection 4 mg  4 mg Intravenous Q6H PRN Sheran Luz D, MD   4 mg at 01/01/22 2001    pantoprazole (PROTONIX) EC tablet 40 mg  40 mg Oral Daily Sheran Luz D, MD   40 mg at 01/01/22 0859    potassium chloride (KLOR-CON M20) CR tablet 40 mEq  40 mEq Oral BID Ashiny, Zelalem A, MD   40 mEq at 01/01/22 1748    rOPINIRole (REQUIP) tablet 1 mg  1 mg Oral QHS Sheran Luz D, MD   1 mg at 01/01/22 2236    senna-docusate (PERICOLACE) 8.6-50 MG per tablet 2 tablet  2 tablet Oral BID PRN Cyndie Chime,  Marlowe Sax, MD   2 tablet at 12/26/21 1820    terazosin (HYTRIN) capsule 2 mg  2 mg Oral QHS Sheran Luz D, MD   2 mg at 01/01/22 2235    thiamine (VITAMIN B1) tablet 100 mg  100 mg Oral Daily Delice Bison, MD   100 mg at 01/01/22 1610    vitamin B-6 (PYRIDOXINE) tablet 50 mg  50 mg Oral Daily Frederich Cha, DO   50 mg at 01/01/22 9604    vitamin D (cholecalciferol) tablet 25 mcg  25 mcg Oral Daily Lendon Colonel, MD   25 mcg at 01/01/22 5409    vitamins/minerals tablet 1 tablet  1 tablet Oral Daily Ashiny, Zelalem A, MD   1 tablet at 01/01/22 0858    zinc sulfate (ZINCATE) capsule 220 mg  220 mg Oral Daily Frederich Cha, DO   220 mg at 01/01/22 8119     Social History     Tobacco Use    Smoking status: Never    Smokeless tobacco: Never   Substance Use Topics    Alcohol use: Never        Family History:   Family History   Problem Relation Age of Onset    Stroke Maternal Grandmother           Review of Systems:     General: Denies fevers, chills, or sweats.  Denies fatigue, weight gain, or weight loss.    Head:  Denies headaches, vision loss, eye pain.  ENT:  Denies nasal congestion, sinus pain, sore throat, oral bleeding  Respiratory:  Denies cough, wheezing, or sputum production.    Cardiovascular:  See HPI.  Denies calf, thigh, or buttock claudication.     Gastrointestinal:  Denies abdominal pain, nausea, vomiting, diarrhea, constipation.   Neurological: see hpi  Musculoskeletal:  Denies arthritis or myalgias.   Genitourinary:  Denies dysuria or bladder problems.  Skin:  Denies rash, itching, or new lesions.    Hematologic:  Denies easy bruising or bleeding.    Endocrine:  Denies heat/cold intolerance or increased thirst.  Psychiatric:  Denies depression or anxiety.     Complete ten point review of systems was performed.  Except as noted above and in the HPI, no other pertinent positives or negatives were identified.      Physical Exam:     BP 113/66   Pulse (!) 117   Temp 98.3 F (36.8 C) (Oral)   Resp 20   Ht 1.651 m (5\' 5" )   Wt 72.6 kg (160 lb)   LMP  (LMP Unknown)   SpO2 98%   BMI 26.63 kg/m     Intake and Output Summary (Last 24 hours) at Date Time    Intake/Output Summary (Last 24 hours) at 01/02/2022 0732  Last data filed at 01/02/2022 0423  Gross per 24 hour   Intake 686 ml   Output 1150 ml   Net -464 ml       General:  Well-nourished, well-developed.  Alert and in no apparent distress.  Eyes:  Anicteric sclera.  Pupils equal and round.    ENT:  Hearing grossly intact. Lips moist, color appropriate for race.  Neck:  2+ carotids bilaterally with normal upstroke; no carotid bruits.  No JVD.  No LAD.  Lungs:  Clear to auscultation bilaterally. No wheezes or crackles.  Respiratory effort unlabored, chest expansion symmetric.  Cardiovascular:  regular rhythm, tachycardic  Abdomen:  Soft, non-tender, non-distended.  No organomegaly.  No pulsatile masses.  Extremities:  2+ bilateral radial and pedal pulses. No edema.  Skin:  Warm, dry, and intact. No rashes or lesions on exposed portions of skin.  Neurologic:  Moving all four extremities; exam grossly non-focal      Ancillary Data:     Cardiac Data Review  Echo Results       Procedure Component Value Units Date/Time    Echocardiogram Adult Complete W Clr/ Dopp Waveform [[id number]] Collected: 12/31/21 1637      Updated: 12/31/21 1826     Site RV Size (AS) normal in size     RV Function Normal     Site RA Size (AS) normal in size     MV Regurgitation Severity trace     TV Regurgitation Severity trace     AV Regurgitation Severity mild     Aortic Stenosis Severity no     Site PV Stenosis (AS) no Doppler evidence of pulmonic valve sclerosis or stenosis     PV Regurgitation Severity no     Atrial Septum Findings No evidence of interatrial shunt by color Doppler.     Aortic Valve Findings The aortic valve is tricuspid.     Aortic Valve Findings There is no aortic stenosis.     Aortic Valve Findings There is mild aortic regurgitation.     Pulmonary Valve Findings The pulmonic valve is structurally normal.     Pulmonary Valve Findings There is no pulmonic regurgitation.     Mitral Valve Findings The mitral valve is structurally normal.     Mitral Valve Findings There is trace mitral regurgitation.     Tricuspid Valve Findings The tricuspid valve is structurally normal.     Tricuspid Valve Findings There is trace tricuspid regurgitation.     Tricuspid Valve Findings Insufficient tricuspid regurgitation jet to estimate pulmonary artery systolic pressure.     IVS Diastolic Thickness (2D) 0.9     LVID diastole (2D) 4.2     LVID systole (2D) 3.3     BP Mod LV Ejection Fraction 43.333     LA Dimension (2D) 2.5     AV Mean Gradient 6     AV Peak Velocity 170     AV Area (Cont Eq VTI) 2.193     AV Area (Cont Eq VTI) 2.19     Ao Root Diameter (2D) 3.3     Prox Ascending Aorta Diameter 3.7     MV E/A 0.569     MV E/A 0.6     RV Basal Diastolic Dimension 3     MV Area (PHT) 9.366     LA Volume Index (BP A-L) 0.018     MV E/e' (Average) 5.92     No Prior TTE True    Narrative:      Name:     [name]  Age:     66 years  DOB:     31-Mar-1956  Gender:     Female  MRN:     [id number]  Wt:     160 lb  BSA:     1.84 m2  Systolic BP:     258 mmHg  Diastolic BP:     67 mmHg  Technical Quality:     Adequate  Exam Date/Time:     12/31/2021 4:37  PM  Study Site:     [facility]  Exam Type:     ECHOCARDIOGRAM ADULT COMPLETE W CLR/ DOPP WAVEFORM    Study Info  Indications  Shortness of breath with high cardiac risk -  Procedure    Complete two-dimensional, color flow and spectral Doppler transthoracic  echocardiogram is performed.    Staff  Sonographer:     Bary Castilla RDCS  Ordering Physician:     Frederich Cha    67 in      History/Risk Factors  Hypertension:     Yes  Hyperlipidemia:     Yes  TIA/CVA:     Yes  COPD:     Yes  Arrhythmias:     Atrial fibrillation    Additional Patient History    Hypokalemia. Paroxysmal A-fib.      Summary    * Left ventricular systolic function is mildly decreased with an ejection  fraction by Biplane Method of Discs of  43 %.    * Mild global hypokinesis.    * There is mild aortic regurgitation.    * There is a venous catheter visualized in the right atrium.      Findings  Left Ventricle    The left ventricle is normal in size. There is normal left ventricular  geometry. Left ventricular systolic function is mildly decreased with an  ejection fraction by Biplane Method of Discs of  43 %. Left ventricular  diastolic filling parameters demonstrate normal diastolic function. Mild  global hypokinesis.  Right Ventricle    The right ventricular cavity size is normal in size. Normal right  ventricular systolic function.      Left Atrium    The left atrium is normal in size.    Right Atrium    The right atrium is normal in size. There is a venous catheter visualized in  the right atrium.    Atrial Septum    No evidence of interatrial shunt by color Doppler.      Aortic Valve    The aortic valve is tricuspid. There is no aortic stenosis. There is mild  aortic regurgitation.    Pulmonary Valve    The pulmonic valve is structurally normal. There is no pulmonic  regurgitation.    Mitral Valve    The mitral valve is structurally normal. There is trace mitral  regurgitation.    Tricuspid Valve    The tricuspid valve is structurally normal.  There is trace tricuspid  regurgitation. Insufficient tricuspid regurgitation jet to estimate pulmonary  artery systolic pressure.      Measurements  2D Measurements  ----------------------------------------------------------------------  Name                                 Value        Normal  ----------------------------------------------------------------------    Parasternal 2D  ----------------------------------------------------------------------   IVS Diastolic Thickness  (2D)                               0.90 cm     0.60-0.90   LVID Diastole (2D)                 4.20 cm     3.80-5.20    LVIW Diastolic Thickness  (2D)                               0.90 cm     0.60-0.95   LVID Systole (2D)  3.30 cm     2.20-3.50   LVOT Diameter                      2.00 cm                 LA Dimension (2D)                  2.50 cm     2.70-3.80   Ao Root Diameter (2D)              3.30 cm     2.70-3.70   Prox Asc Ao Diameter               3.70 cm     2.30-3.10     LV Ejection Fraction 2D  ----------------------------------------------------------------------  LV EF (BP MOD)                        43 %         54-74     Apical 2D Dimensions  ----------------------------------------------------------------------   RV Basal Diastolic  Dimension                          3.00 cm     2.50-4.10   LA Volume Index (BP A-L)        17.74 ml/m2       <=34.00   RA Area (4C)                     12.10 cm2       <=18.00  LVOT/Aortic Valve Doppler Measurements  ----------------------------------------------------------------------  Name                                 Value        Normal  ----------------------------------------------------------------------    LVOT Doppler  ----------------------------------------------------------------------  LVOT Peak Velocity                1.15 m/s                   AoV Doppler  ----------------------------------------------------------------------  AV Peak Velocity                   1.70 m/s                 AV Peak Gradient                   12 mmHg                 AV Mean Gradient                    6 mmHg           <20   AV Area (Cont Eq VTI)             2.19 cm2        >=3.00   AV V1/V2 Ratio                        0.68  RVOT/Pulmonic Valve Doppler Measurements  ----------------------------------------------------------------------  Name                                 Value  Normal  ----------------------------------------------------------------------    PV Doppler  ----------------------------------------------------------------------  PV Peak Velocity                  0.95 m/s  Mitral Valve Measurements  ----------------------------------------------------------------------  Name                                 Value        Normal  ----------------------------------------------------------------------    MV Doppler  ----------------------------------------------------------------------  MV E Peak Velocity                0.65 m/s                 MV A Peak Velocity                1.15 m/s                 MV E/A                                0.57                   MV Annular TDI  ----------------------------------------------------------------------  MV Septal e' Velocity             0.11 m/s        >=0.08   MV E/e' (Septal)                      5.84        <=8.00   MV Lateral e' Velocity            0.11 m/s        >=0.10   MV E/e' (Lateral)                     6.00        <=8.00      Pericardium / Pleural Effusion    No pericardial effusion visualized.    Inferior Vena Cava    The IVC is normal in size with > 50% respiratory variance consistent with  normal RA pressure of 3 mmHg.    Aorta    The aortic root is normal in size. The ascending aorta is normal in size.          EKG Results       ** No results found for the last 48 hours. **            Labs:   Lab Results   Component Value Date    WBC 5.27 01/01/2022    HGB 8.1 (L) 01/01/2022    HCT 26.2 (L) 01/01/2022    MCV 79.9  01/01/2022    PLT 369 (H) 01/01/2022     No results found for: CKTOTAL, CKMB, CKMBINDEX  No results found for: CKTOTAL  Lab Results   Component Value Date    BUN 9.0 01/01/2022    NA 133 (L) 01/01/2022    K 4.3 01/01/2022    CL 100 01/01/2022    CO2 18 01/01/2022       Chest X-Ray:  Radiology Results (24 Hour)       Procedure Component Value Units Date/Time    CT Angiogram Chest [[id number]] Collected: 01/01/22 0753    Order Status: Completed Updated: 01/01/22 0759    Narrative:  HISTORY: Tachycardia.    COMPARISON: CT chest without contrast 12/28/2021    TECHNIQUE: CTA chest WITH intravenous contrast. PE protocol. 100 mL IV  Omnipaque 350 was administered. 3D MIP images are submitted and  reviewed. The following dose reduction techniques were utilized:  Automated exposure control and/or adjustment of the mA and/or kV  according to patient size, and the use of iterative reconstruction  technique.    FINDINGS:     LINES/TUBES: None.    LUNGS: No consolidation, edema or mass.  PLEURA: No pleural effusions or pneumothorax.    HEART: Not enlarged. Trace anterior pericardial fluid.  MEDIASTINUM: No axillary, hilar or mediastinal lymphadenopathy.  PULMONARY ARTERIES: Limited evaluation of the subsegmental pulmonary  arteries due to respiratory motion. No pulmonary embolism is identified.  AORTA: Stable dilated ascending aorta measuring up to 4.1 cm. No aortic  dissection.    UPPER ABDOMEN: No acute findings.    BONES AND SOFT TISSUES: No suspicious or destructive osseous lesion.      Impression:        1.Limited evaluation of the subsegmental pulmonary arteries. No evidence  of pulmonary embolism given this limitation.  2.Dilated ascending aorta measuring up to 4.1 cm.    Demetrios Isaacs, MD  01/01/2022 7:57 AM            The patient was seen and examined, chart reviewed.  I reviewed the note by the PA/NP, discussed the case and agree with the findings and plan as outlined.      Theone Stanley MD, [facility], RPVI  Interventional  Cardiology  [facility]

## 2022-01-01 NOTE — Nursing Progress Note (Signed)
Removed R IJ catheter at bedside with Nancy Fetter RN. Sutures removed, pt performed Valsalva maneuver, applied pressure for 5 minutes, hemostasis achieved. Pt remained supine for 45 minutes following removal. Pt VSS.

## 2022-01-01 NOTE — PT Progress Note (Addendum)
Physical Therapy Treatment  [name]  Post Acute Care Therapy Recommendations:     Discharge Recommendations:  Acute Rehab    If Acute Rehab  recommended discharge disposition is not available, patient will need hands on assist for mobility, HHA, transport service and HHPT.     DME needs IF patient is discharging home: Wheelchair-manual, Front wheel walker    Therapy discharge recommendations may change with patient status.  Please refer to most recent note for up-to-date recommendations.      Assessment:   Significant Findings: none    Pt eager to work w/ PT. Unable to stand w/o blocking knees on first attempt. Session focused on wt shifting. Able to pick up each foot w/o blocking knees on first attempt but needs VC to extend the knee. Needs more support/blocking on succeeding attempts. Cont PT POC.     Patient left without needs and call bell within reach. RN notified of session outcome.     Treatment Activities: thera ex(incorporated in functional mobility)    Educated the patient to role of physical therapy, plan of care, goals of therapy and safety with mobility and ADLs.      Plan:   Treatment/Interventions: Exercise, Neuromuscular re-education, Functional transfer training, Gait training, LE strengthening/ROM, Endurance training, Cognitive reorientation, Patient/family training, Equipment eval/education, Bed mobility, Compensatory technique education        PT Frequency: 4-5x/wk     Continue plan of care.       Unit: [facility] TOWER 7  Bed: F745/F745.01    Precautions and Contraindications:  Falls        Updated Medical Status/Imaging/Labs:   reviewed  Subjective:   "  I'm not feeling well. I don't know.    "    Pain:  Denies pain    Patient's medical condition is appropriate for Physical Therapy intervention at this time.  Patient is agreeable to participation in the therapy session. Nursing clears patient for therapy.    Objective:   Patient is in bed with peripheral IV in place.  Pt  wore mask during therapy session:No       Functional Mobility:  Bed Mobility:  Supine to Sit:min, inc time and effort  Sit to Stand:min/mod of 2, knees blocked      PMP Activity: Step 4 dangle at EOB        Balance:  Standing Static:min of 2  Standing Dynamic:min/mod of 2    Patient Participation: good  Patient Endurance: good        Patient left with call bell within reach, all needs met,   SCD: off  Fall mat: in place  Bed alarm: on  Chair alarm: n/a  and all questions answered. RN notified of session outcome and patient response.     Goals:  Goals  Goal Formulation: With patient  Time for Goal Acheivement: 10 visits  Goals: Select goal  Pt Will Roll Left: independent  Pt Will Roll Right: independent  Pt Will Go Supine To Sit: with stand by assist  Pt Will Perform Sit To Supine: with minimal assist (for LEs)  Pt Will Sit Edge of Bed: 11-15 min, modified independent  Pt Will Perform Sit to Stand: with minimal assist  Pt Will Transfer Bed/Chair: with rolling walker, with moderate assist  Pt Will Ambulate: 1-10 feet, with rolling walker, with moderate assist  Pt Will Propel Wheelchair: 51-150 feet, with stand by assist  Pt Will Demo / Request Pressure Relief: modified independent  PPE worn during session: procedural mask  Tech present: Alcario Drought  PPE worn by tech: procedural mask    [employer], PT   Pager ID# [phone] 01/01/2022, 4:10 PM    Time of Treatment  PT Received On: 01/01/22  Start Time: 1430  Stop Time: 1500  Time Calculation (min): 30 min  Treatment # 5 out of 10 visits

## 2022-01-01 NOTE — Progress Notes (Signed)
[facility] Note:     Received call from Katie with [employer] advising that medical director is offering a proactive peer to peer prior to rendering a determination for acute rehab insurance authorization.  Deadline for peer to peer is Monday at 12 pm.  Provider can call [phone].  Reference #: [id number].  Deadline for peer to peer is Monday at 12 pm.       Willette Cluster RN, BSN, CCM  Rehab Admissions Liaison/Insurance Authorizations  [facility]  [facility] and [facility]

## 2022-01-02 LAB — VITAMIN B1, WHOLE BLOOD: Whole Blood Vitamin B1: 35 nmol/L — ABNORMAL LOW (ref 78–185)

## 2022-01-02 MED ORDER — LOSARTAN POTASSIUM 25 MG PO TABS
25.0000 mg | ORAL_TABLET | Freq: Every day | ORAL | Status: DC
Start: 2022-01-03 — End: 2022-01-03
  Administered 2022-01-03: 09:00:00 25 mg via ORAL
  Filled 2022-01-02: qty 1

## 2022-01-02 MED ORDER — METOPROLOL SUCCINATE ER 50 MG PO TB24
75.0000 mg | ORAL_TABLET | Freq: Every day | ORAL | Status: DC
Start: 2022-01-02 — End: 2022-01-03
  Administered 2022-01-02: 75 mg via ORAL
  Filled 2022-01-02 (×2): qty 2

## 2022-01-02 NOTE — Progress Notes (Signed)
01/02/22 1118   Respiratory Parameters - Non Ventilated   Status Completed   Resp Rate 18   Vital capacity 1.25 L   $ Vital Capacity Performed? Yes   Negative inspiratory force -16 cm H2o   $ NIF DONE Yes   Adverse Reactions None   Performing Departments   PLAB VC performing department formula [id number]   O2 Device performing department formula 0   Resp param - No vent performing department formula [id number]

## 2022-01-02 NOTE — Progress Notes (Signed)
Pt is A&Ox4, tachy, VS otherwise stable. C/o chest pain, PRN dilaudid administered with desired effect, foley in place, output clear. Pt is calm and cooperative. PT/OT recommended d/c to acute rehab, awaiting insurance auth.

## 2022-01-02 NOTE — Plan of Care (Signed)
NURSING PROGRESS NOTE    Patient Name: [name] (66 y.o. female)  Admission Date: 12/25/2021 [facility] Day 8)    Shift Note: Patient is A & O x4, FC, MAE with strength of 4/5 BUE and 2/5 BLE, clear speech.  PRN dilaudid administered  for pain with effect/ benadryl given- pt stated tongue felt thick and itchy ( MD notified)/ Zofran administered x 1 for nausea.  Foley care completed x 2. Per protocol and for BM.  RA, clear/ diminished lung sounds.  Pt noted with tachycardia, and complained of SOB- vital signs and 02 sat was stable and no labored breathing was observed with no sign of distress- MD notified by charge nurse.  Consistent carbs diet, thin liquids, whole pills.  Bleeding, falls and all safety precautions in place. Purposeful hourly rounding done.     Recent Labs   Lab 01/01/22  0353   Sodium 133*   Potassium 4.3   Chloride 100   CO2 18   BUN 9.0   Creatinine 0.6   EGFR >60.0   Glucose 111*   Calcium 9.2       Recent Labs   Lab 01/01/22  0353   WBC 5.27   Hgb 8.1*   Hematocrit 26.2*   Platelets 369*         Patient Lines/Drains/Airways Status       Active Lines, Drains and Airways       Name Placement date Placement time Site Days    Peripheral IV 12/25/21 20 G Standard Right Antecubital 12/25/21  0600  Antecubital  8    Urethral Catheter Non-latex 12 Fr. 12/31/21  1530  Non-latex  1                    MEWS Score: 3    Last BM: 03/11  Pending Orders: labs  Discharge Plan: TBD    Safety Checklist   Fall Precautions Y   Avasys Y   Seizure Precautions Y   Aspiration Precautions Y   Belongings Checked Y     Service Essentials  Medication Teaching Y   Purposeful Hourly Rounding Y       Interpreter Services:  Does the patient require an Interpreter? N    If yes, what form of interpreter services was used? NA     If family was utilized, is interpreter waiver form signed and in the chart? NA       Problem: Moderate/High Fall Risk Score >5  Goal: Patient will remain free of falls  Outcome: Progressing  Flowsheets  (Taken 01/02/2022 0744)  High (Greater than 13):   HIGH-Initiate use of floor mats as appropriate   HIGH-Apply yellow "Fall Risk" arm band   HIGH-Bed alarm on at all times while patient in bed   HIGH-Utilize chair pad alarm for patient while in the chair   HIGH-Visual cue at entrance to patient's room     Problem: Compromised Tissue integrity  Goal: Damaged tissue is healing and protected  Outcome: Progressing  Flowsheets (Taken 01/02/2022 0744)  Damaged tissue is healing and protected:   Monitor/assess Braden scale every shift   Provide wound care per wound care algorithm   Reposition patient every 2 hours and as needed unless able to reposition self   Relieve pressure to bony prominences for patients at moderate and high risk   Avoid shearing injuries   Keep intact skin clean and dry   Use incontinence wipes for cleaning urine, stool and caustic drainage. Foley  care as needed   Monitor external devices/tubes for correct placement to prevent pressure, friction and shearing  Goal: Nutritional status is improving  Outcome: Progressing  Flowsheets (Taken 01/02/2022 0744)  Nutritional status is improving:   Allow adequate time for meals   Encourage patient to take dietary supplement(s) as ordered     Problem: Bladder/Voiding  Goal: Perineal skin integrity is maintained or improved  Outcome: Progressing  Flowsheets (Taken 12/27/2021 1334 by Henrine Screws, RN)  Perineal skin integrity is maintained or improved:   Keep intact skin clean and dry   Use protective skin barriers to decrease potential skin breakdown   Consult/collaborate with Wound Care Nurse  Goal: Free from infection  Outcome: Progressing  Flowsheets (Taken 01/01/2022 1637)  Free from infection: Monitor/assess for signs and symptoms of infection     Problem: Neurological Deficit  Goal: Neurological status is stable or improving  Outcome: Progressing  Flowsheets (Taken 01/01/2022 0658 by Tonye Becket, RN)  Neurological status is stable or improving:    Monitor/assess/document neurological assessment (Stroke: every 4 hours)   Perform CAM Assessment     Problem: Pain interferes with ability to perform ADL  Goal: Pain at adequate level as identified by patient  Outcome: Progressing  Flowsheets (Taken 01/01/2022 1637)  Pain at adequate level as identified by patient:   Assess pain on admission, during daily assessment and/or before any "as needed" intervention(s)   Reassess pain within 30-60 minutes of any procedure/intervention, per Pain Assessment, Intervention, Reassessment (AIR) Cycle     Problem: Side Effects from Pain Analgesia  Goal: Patient will experience minimal side effects of analgesic therapy  Outcome: Progressing  Flowsheets (Taken 12/28/2021 1502 by Henrine Screws, RN)  Patient will experience minimal side effects of analgesic therapy: Monitor/assess patient's respiratory status (RR depth, effort, breath sounds)

## 2022-01-02 NOTE — Progress Notes (Signed)
[facility]   [facility] PROGRESS NOTE    Today's date & time: 01/02/22 8:10 AM  Patient Name: [name]  Attending Physician: [name] Cha, DO  Admission Date: 12/25/2021    Assessment:     Active Hospital Problems    Diagnosis    Hypokalemia    Thoracic back pain    Gastroparesis    Weakness of both legs    Guillain Barr syndrome    Type 2 diabetes mellitus    Paroxysmal atrial fibrillation     Plan:   Difficulty walking and bilateral leg and hand weakness - likely from statin induced myositis vs inflammatory myositis vs immune mediated myostitis. patient to has multiple vague complaints who was transferred from outside hospital for consideration of EMG for [name] consideration, and possible IVIG, currently seen by neurology team here and based on her history started the patient on thiamine IV, at this time we will continue to perform EMG, possible IVIG, LP was done and came back unremarkable, got seen by PT and OT team who recommended going to acute rehab, fall precaution, continue monitoring.  Patient had NIF and VC done on 3/5 which showed low volume.  Patient continued proximal muscle weakness, areflexia, highly suspicious for GBS and neurology team started the patient on IVIG on 12/27/2021, after the start of IVIG within the first 10 minutes patient developed numbness on the upper lip on the right side and tongue numbness, was that it was discontinued and was placed as allergy, following that IgG antibodies were ordered, IVIG antibodies ordered, stroke team recommended placing dialysis catheter and doing plasma exchange, pathology team was contacted, patient says every day she is feeling better, currently awaiting the start of plasma exchange.  Patient also had a EMG done on 12/27/2021, based on that CT scan of the abdomen and pelvis was done which showed no hematoma.   MRI L and R thigh or femur W WO   Mild edema and atrophy involving the semimembranosus and biceps  femoris muscles of  the right and left thighs, noted to be bilateral and  symmetric in appearance. These findings are nonspecific, and may be seen in the setting of muscle denervation or myositis.  Mild edema in the proximal adductor longus and adductor magnus muscles bilaterally.   No areas of nonenhancement to suggest muscle necrosis.  LDH 450, CPK 84, LFTs, aldolase, viral hepatitis panel negative  Discontinue HD catheter   Appreciate input from neurology team.   Schedule outpatient muscle biopsy   Vitamin D deficiency - repleted.   Zinc deficiency - repleted  B6 deficiency - repleted  Iron deficiency anemia and AICD - eval for Celiac disease  Upper abdominal and mid back pain - patient complaining of having some pain in the epigastric area banding leg to the mid back, she is placed on PPI, Tums, muscle relaxants, as needed pain medications, may consider placing the patient on scheduled Tylenol, will continue monitoring.  Pain is getting better by the day .   Recommend outpatient GI evaluation for malabsorption disorder given vitamin and mineral deficiencies.   Poor appetite/malnourished - nutrition consult.  CT Chest/Abd/Pelvis - no malignancy noted.    Urinary retention - tamsulosin 0.4mg  po daily.  Foley catheter placed.    Paroxysmal atrial fibrillation/tachycardia - rate is controlled, patient is on apixaban.  Metoprolol succcinate increased to 50mg  po daily .  CTA chest negative for PE, lung masses, consolidations, or lymphadenopathy.    Cardiomyopathy   ECHO Summary    *  Left ventricular systolic function is mildly decreased with an ejection  fraction by Biplane Method of Discs of  43 %.    * Mild global hypokinesis.    * There is mild aortic regurgitation.    * There is a venous catheter visualized in the right atrium.  Will need an ischemic work up after her LE weakness improves  Appreciate [facility] cardiology input  Hypertension - blood pressure is controlled, continue monitoring.   COPD - noted, patient is getting  Symbicort, in no acute respiratory distress, on oxygen.   Type 2 diabetes mellitus - borderline hemoglobin A1c in the past 2020 at 6.0, currently no treatment, hemoglobin A1c 5.3%.   Hypokalemia - replaced, monitor closely, magnesium level is also lower, replaced magnesium, now  started BID potassium ordered.   Anemia - most likely poor nutrition and chronic disease, will check iron profile, consider iron infusion.  Multivitamins added.   Bipolar disorder - patient seem to have some issues with behavior, patient is on nortriptyline and Latuda, no acute exacerbation, continue monitoring.  Would consider changing Latuda given possibly tardive dyskinesia cause tongue twisting.  Follow up with psychiatry, Dr. Earnest Rosier.  Consider changing medications to help with dyskinesia.    Constipation - patient said last bowel meant a few days ago, stool softeners and laxatives given, had multiple bowel movements on the evening of 12/27/2021, continue monitoring  Mouth itching and swelling sensation likely from contrast allergy - benadryl prn.  Stop zinc.  Caution with losartan started by cardiology.      GI prophylaxis: Protonix, Maalox  DVT ppx Apixaban on hold to remove HD catheter.      Spoke to patient's daughter over the phone and updated her about patient's condition    Subjective     CC: Weakness of both legs    Interval History/24 hour events: Patient say every day she is getting better, no shortness of breath, no chest pain, back pain seem to be getting better    HPI: HPI per Admitting Provider   " [name] is a 66 y.o. female hx afib on Eliquis, HTN, HLD, DM, COPD, TIAs, COPD, bipolar disorder, [facility] admit 2/7-2/17/23 for rhabdomyolysis (peak CK 4464 - statin d/c'ed) + vomiting ?gastroparesis + starvation ketoacidosis + UTI, [facility] admit 2/18-2/20/23 for rhabdomyolysis (peak CK 2509) + transaminitis + bilateral leg weakness + right paresthesias, [facility] ER visit 3/2 for bilateral leg weakness who presents to [facility] with  difficulty standing and walking, bilateral leg/hand weakness, bilateral leg/hand/face paresthesias. She noted bilateral leg weakness with her prior [facility] admission 2/7-2/17 and was readmitted for bilateral leg weakness 2/18-2/20. The past 4 days she has had difficulty standing and walking. The past 4 days she notes new bilateral hand and face paresthesias and bilateral hand weakness. She has some shortness of breath but is speaking in full sentences. She went to [facility] ER 3/2 and was discharged. She saw her PMD who advised she go to an [facility]. At [address] [facility] she got MRI brain and CTL-spine which did not show a cause for her symptoms. [facility] called neurology Dr. Francesco Sor who said to consider IVIG for 5 days, LP, EMG. The LP was done at [facility]. Dr. Francesco Sor advised transfer to [facility]. These symptoms are sudden onset, moderate intensity, without alleviating factors. "     Hospital course:   Following admission, patient was seen by neurology consult who performed MRI of the entire spine and brain, without contrast which came back to show no major issues except  on the lumbar area L4-L5 disc disease with facet arthrosis and degenerative changes, neurology team recommended LP and CSF studies so far are unrevealing, patient continued to complain of having bandlike pain around the epigastric area & in the back, there was tenderness on the right side of the thoracic vertebral area in the muscle, Lidoderm patch applied, patient is getting Protonix and Maalox, has not had a bowel movement for that reason given lactulose and stool softeners.  Patient was [facility] for consideration of IVIG with a suspicion of GBS, EMG currently pending.     Due to her history of not eating well and has been losing weight over the past few weeks, neurology team felt may be nutritional and started on on IV thiamine, patient has been requesting narcotics for the pain that she is got but not really having any tenderness on  examination.  Patient did NIF and vital capacity on the morning of 12/26/2021 which came back -12 cm H2O and vital capacity 1.5 L.  Patient is not in any respiratory distress, not needing any oxygen at this time.  Patient had additional laboratory studies including HIV and syphilis which came back negative, negative for COVID 19, potassium was noted to be low for which replacement was given, CPK slightly elevated at 239.  Cultures remain negative.     Patient was seen by PT and OT team who recommended going to acute rehab.  Patient was having constipation, stool softeners and laxatives given.     3/8: Pt feels better.  Diffuse pain improved.  LE weakness improved.    3/9: Patient seen and examined she was noted to have abdominal distention.  Patient reports poor appetite over the past 2 weeks.  She denies any history of bariatric surgery or malabsorption disorder.  3/10: Pt c/o shortness of breath and palpitations, but no chest pain.  Pt has remained tachycardic throughout her hospitalization.  Pt urinates, but she still retains urine requiring straight catheterization.  Her legs feel weak again today.          Review of Systems:   General - back pain   Resp - no SOB   CVS - no CP, no CAD   GI - no nausea, no diarrhea   CNS - no visual deficit, generalized  weakness    Physical Exam:     Vitals:    01/01/22 2235 01/01/22 2343 01/02/22 0423 01/02/22 0750   BP: 127/76 119/76 113/66 109/56   Pulse:  (!) 116 (!) 117 (!) 118   Resp:  20 20 16    Temp:  97.9 F (36.6 C) 98.3 F (36.8 C) 98.4 F (36.9 C)   TempSrc:  Oral Oral Oral   SpO2:  98% 98% 98%   Weight:       Height:              Intake/Output Summary (Last 24 hours) at 01/02/2022 0810  Last data filed at 01/02/2022 0423  Gross per 24 hour   Intake 686 ml   Output 1150 ml   Net -464 ml       General: Awake, alert, oriented x 3; no acute distress.  HEENT: PERRLA, eomi, sclera anicteric, oropharynx clear without lesions, mucous membranes moist  Neck: supple, no  lymphadenopathy, no thyromegaly, no JVD, no carotid bruits  Cardiovascular: regular rate and rhythm, no murmurs, rubs or gallops  Lungs: clear to auscultation bilaterally, without wheezing, rhonchi, or rales  Abdomen: soft, non-tender, suprapubic area  distended; no palpable masses, no hepatosplenomegaly, normoactive bowel sounds, no rebound or guarding  Extremities: no clubbing, cyanosis, or edema  Neuro: cranial nerves grossly intact, strength 5/5 in upper extremities except 3+/5 hand intrinsics, bilateral hip extension 2/5, bilateral knee extension 3/5, bilateral dorsiflexion 3/5, bilateral plantarflexion 4/5, paresthesias bilateral feet/hands/face but intact to light touch, could not elicit knee DTRs, follows commands.   Skin: no rashes or lesions noted  GU: no CVA tenderness     Meds:   Medications were reviewed by me:  Current Facility-Administered Medications   Medication Dose Route Frequency    amLODIPine  2.5 mg Oral Daily    apixaban  5 mg Oral Q12H SCH    bisacodyl  5 mg Oral Daily    budesonide-formoterol  2 puff Inhalation BID    lidocaine  1 patch Transdermal Q24H    lurasidone  120 mg Oral Daily    megestrol  400 mg Oral BID Meals    metoprolol succinate XL  50 mg Oral Daily    nortriptyline  10 mg Oral QHS    pantoprazole  40 mg Oral Daily    potassium chloride  40 mEq Oral BID    rOPINIRole  1 mg Oral QHS    terazosin  2 mg Oral QHS    thiamine  100 mg Oral Daily    vitamin B-6  50 mg Oral Daily    vitamin D (cholecalciferol)  25 mcg Oral Daily    vitamins/minerals  1 tablet Oral Daily    zinc sulfate  220 mg Oral Daily       Labs:   Labs reviewed personally include:  Recent Labs   Lab 01/01/22  0353 12/31/21  0356   WBC 5.27 7.67   Hgb 8.1* 8.1*   Hematocrit 26.2* 26.2*   Platelets 369* 370*            Recent Labs   Lab 01/01/22  0353 12/31/21  0356   Sodium 133* 134*   Potassium 4.3 3.8   Chloride 100 101   CO2 18 21   BUN 9.0 6.0*   Creatinine 0.6 0.6   EGFR >60.0 >60.0   Glucose 111* 86   Calcium  9.2 9.3    Recent Labs   Lab 12/31/21  0356   Alkaline Phosphatase 60   Bilirubin, Total 0.3   Bilirubin Direct 0.2   Protein, Total 6.5   Albumin 2.9*   ALT 32   AST (SGOT) 20            Microbiology Results (last 15 days)       Procedure Component Value Units Date/Time    COVID-19 (SARS-CoV-2) only (Liat Rapid) - Required by Extended care facility discharge within 24 hours [[id number]] Collected: 12/31/21 1434    Order Status: Completed Specimen: Nasopharyngeal Updated: 12/31/21 1524     Purpose of COVID testing Screening     SARS-CoV-2 Specimen Source Nasal Swab     SARS CoV 2 Overall Result Not Detected     Comment: __________________________________________________  -A result of "Detected" indicates POSITIVE for the    presence of SARS CoV-2 RNA  -A result of "Not Detected" indicates NEGATIVE for the    presence of SARS CoV-2 RNA  __________________________________________________________  Test performed using the Roche cobas Liat SARS-CoV-2 assay. This assay is  only for use under the Food and Drug Administration's Emergency Use  Authorization. This is a real-time RT-PCR assay for the qualitative  detection of SARS-CoV-2 RNA.  Viral nucleic acids may persist in vivo,  independent of viability. Detection of viral nucleic acid does not imply the  presence of infectious virus, or that virus nucleic acid is the cause of  clinical symptoms. Negative results do not preclude SARS-CoV-2 infection and  should not be used as the sole basis for diagnosis, treatment or other  patient management decisions. Negative results must be combined with  clinical observations, patient history, and/or epidemiological information.  Invalid results may be due to inhibiting substances in the specimen and  recollection should occur. Please see Fact Sheets for patients and providers  located:  [email or web address]         Narrative:      o Collect and clearly label specimen type:  o PREFERRED-Upper respiratory  specimen: One Nasal Swab in  Transport Media.  o Hand deliver to laboratory ASAP  Testing as required by extended care facility?->Yes  Screening    COVID-19 (SARS-CoV-2) only (Liat Rapid) - Required by Extended care facility discharge within 24 hours [[id number]]  (Abnormal) Collected: 12/31/21 1241    Order Status: Completed Specimen: Nasopharyngeal Updated: 12/31/21 1426     Purpose of COVID testing Screening     SARS-CoV-2 Specimen Source Nasal Swab     SARS CoV 2 Overall Result Invalid     Comment: TEST RERUN TWICE AND UNABLE TO OBTAIN RESULT .SPOKE TO [id number]  12/31/2021  14:26  __________________________________________________  -A result of "Detected" indicates POSITIVE for the    presence of SARS CoV-2 RNA  -A result of "Not Detected" indicates NEGATIVE for the    presence of SARS CoV-2 RNA  __________________________________________________________  Test performed using the Roche cobas Liat SARS-CoV-2 assay. This assay is  only for use under the Food and Drug Administration's Emergency Use  Authorization. This is a real-time RT-PCR assay for the qualitative  detection of SARS-CoV-2 RNA. Viral nucleic acids may persist in vivo,  independent of viability. Detection of viral nucleic acid does not imply the  presence of infectious virus, or that virus nucleic acid is the cause of  clinical symptoms. Negative results do not preclude SARS-CoV-2 infection and  should not be used as the sole basis for diagnosis, treatment or other  patient management decisions. Negative results must be combined with  clinical observations, patient history, and/or epidemiological information.  Invalid results may be due to inhibiting substances in the specimen and  recollection should occur. Please see Fact Sheets for patients and providers  located:  [email or web address]         Narrative:      o Collect and clearly label specimen type:  o PREFERRED-Upper respiratory specimen: One Nasal Swab in  Transport  Media.  o Hand deliver to laboratory ASAP  Testing as required by extended care facility?->Yes  Screening    Culture + Gram Stain,Aerobic, CSF [[id number]] Collected: 12/24/21 2232    Order Status: Completed Specimen: Cerebrospinal Fluid from CSF (Lumbar Puncture Spinal Fluid) Updated: 01/01/22 1458    Narrative:      ORDER#: [id number]                                    ORDERED BY: PATITUCCI, LAUR  SOURCE: CSF (Lumbar Puncture Spinal Fluid) lumbar spiCOLLECTED:  12/24/21 22:32  ANTIBIOTICS AT COLL.:  RECEIVED :  12/27/21 18:51  Stain, Gram                                FINAL       12/27/21 20:56  12/27/21   No WBCs seen             No organisms seen             Stain performed on Cytospin (concentrated) specimen  Culture and Gram Stain, Aerobic, CSF       FINAL       01/01/22 14:58  01/01/22   No Growth      CSF Meningitis/Encephalitis Pathogen Panel PCR [[id number]] Collected: 12/24/21 2232    Order Status: Completed Specimen: CSF (Lumbar Puncture Spinal Fluid) Updated: 12/25/21 1110     CSF Eschericia coli K1 by PCR Not Detected     CSF Haemophilus influenza by PCR Not Detected     CSF Listeria monocytogenes by PCR Not Detected     CSF Neisseria meningitidis (encapsulated) by PCR Not Detected     CSF Streptococcus agalactiae by PCR Not Detected     CSF Streptococcus pneumoniae by PCR Not Detected     CSF Cytomegalovirus by PCR Not Detected     CSF Enterovirus by PCR Not Detected     CSF Herpes simplex virus 1 by PCR Not Detected     CSF Herpes simplex virus 2 by PCR Not Detected     CSF Human herpesvirus 6 by PCR Not Detected     CSF Human parechovirus by PCR Not Detected     CSF Varicella zoster virus by PCR Not Detected     CSF Cryptococcus neoformans/gattii by PCR Not Detected     Comment: Test performed with the Film[employer] Meningitis/Encephalitis (ME)  PCR Panel. A negative result does not rule out central nervous  system infection and results from this panel are not intended  to be  used as the sole basis for diagnosis, treatment, or  other patient management decisions. Consider pathogen specific  testing if FilmArray ME is negative but clinical suspicion is  high for a particular infection. False negative results could  be due to the presence of strains with genetic variability in  the target regions,concentration of pathogen nucleic acid below  the limit of detection, or improper specimen handling.  This test has not been specifically evaluated with CSF from  immunocompromised patients and this test may be affected by  concurrent antimicrobial therapy. Positive results may be due  to detection of non-viable organism. Clinical correlation is  required. False positives may result from contamination during  specimen collection or processing. This test should not be  used for monitoring treatment of infection.         Narrative:      Indication for Meningitis/Encephalitis Panel PCR  Testing:->Very high clinical concern for infectious  encephalitis    COVID-19 (SARS-CoV-2) only (Liat Rapid) asymptomatic admission [[id number]] Collected: 12/24/21 2057    Order Status: Completed Specimen: Nasopharyngeal Updated: 12/24/21 2136     Purpose of COVID testing Screening     SARS-CoV-2 Specimen Source Nasal Swab     SARS CoV 2 Overall Result Not Detected     Comment: __________________________________________________  -A result of "Detected" indicates POSITIVE for the    presence of SARS CoV-2 RNA  -A result of "Not Detected" indicates NEGATIVE for the    presence of SARS CoV-2 RNA  __________________________________________________________  Test performed using the Roche cobas Liat SARS-CoV-2 assay. This assay is  only for use under the Food and Drug Administration's Emergency Use  Authorization. This is a real-time RT-PCR assay for the qualitative  detection of SARS-CoV-2 RNA. Viral nucleic acids may persist in vivo,  independent of viability. Detection of viral nucleic acid does not imply the  presence  of infectious virus, or that virus nucleic acid is the cause of  clinical symptoms. Negative results do not preclude SARS-CoV-2 infection and  should not be used as the sole basis for diagnosis, treatment or other  patient management decisions. Negative results must be combined with  clinical observations, patient history, and/or epidemiological information.  Invalid results may be due to inhibiting substances in the specimen and  recollection should occur. Please see Fact Sheets for patients and providers  located:  [email or web address]         Narrative:      o Collect and clearly label specimen type:  o PREFERRED-Upper respiratory specimen: One Nasal Swab in  Transport Media.  o Hand deliver to laboratory ASAP  Indication for testing->Extended care facility admission to  semi private room  Screening            Imaging personally reviewed, including:  Radiology Results (24 Hour)       ** No results found for the last 24 hours. **          ECHO  Summary    * Left ventricular systolic function is mildly decreased with an ejection  fraction by Biplane Method of Discs of  43 %.    * Mild global hypokinesis.    * There is mild aortic regurgitation.    * There is a venous catheter visualized in the right atrium.    Safety Checklist:     DVT prophylaxis:  CHEST guideline (See page [id number]) Chemical and Mechanical   Foley:  [place] Rn Foley protocol Not present   IVs:  Peripheral IV   PT/OT: Ordered   Daily CBC & or Chem ordered:  SHM/ABIM guidelines (see #5) Yes, due to clinical and lab instability   Reference for approximate charges of common labs: CBC auto diff - $76  BMP - $99  Mg - $79    Lines:          Patient Lines/Drains/Airways Status       Active PICC Line / CVC Line / PIV Line / Drain / Airway / Intraosseous Line / Epidural Line / ART Line / Line / Wound / Pressure Ulcer / NG/OG Tube       Name Placement date Placement time Site Days    Peripheral IV 12/25/21 20 G Standard Right  Antecubital 12/25/21  0600  Antecubital  1                     Disposition:   Today's date: 01/02/2022  Length of Stay: 8  Anticipated medical stability for discharge: March,  8 - Afternoon  Reason for ongoing hospitalization: Generalized weakness  Anticipated discharge needs: Acute rehab    Signed by: [name] Cha, DO

## 2022-01-03 ENCOUNTER — Inpatient Hospital Stay
Admission: RE | Admit: 2022-01-03 | Discharge: 2022-01-26 | DRG: 555 | Disposition: A | Discharge status: Skilled Nursing Facility | Payer: 59 | Source: Other Acute Inpatient Hospital | Attending: Physical Medicine & Rehabilitation | Admitting: Physical Medicine & Rehabilitation

## 2022-01-03 DIAGNOSIS — D509 Iron deficiency anemia, unspecified: Secondary | ICD-10-CM | POA: Diagnosis present

## 2022-01-03 DIAGNOSIS — E60 Dietary zinc deficiency: Secondary | ICD-10-CM | POA: Diagnosis present

## 2022-01-03 DIAGNOSIS — E43 Unspecified severe protein-calorie malnutrition: Secondary | ICD-10-CM | POA: Diagnosis present

## 2022-01-03 DIAGNOSIS — R339 Retention of urine, unspecified: Secondary | ICD-10-CM | POA: Diagnosis present

## 2022-01-03 DIAGNOSIS — I110 Hypertensive heart disease with heart failure: Secondary | ICD-10-CM | POA: Diagnosis present

## 2022-01-03 DIAGNOSIS — M609 Myositis, unspecified: Principal | ICD-10-CM | POA: Diagnosis present

## 2022-01-03 DIAGNOSIS — Z7951 Long term (current) use of inhaled steroids: Secondary | ICD-10-CM

## 2022-01-03 DIAGNOSIS — B3731 Acute candidiasis of vulva and vagina: Secondary | ICD-10-CM | POA: Diagnosis not present

## 2022-01-03 DIAGNOSIS — E871 Hypo-osmolality and hyponatremia: Secondary | ICD-10-CM | POA: Diagnosis not present

## 2022-01-03 DIAGNOSIS — K5900 Constipation, unspecified: Secondary | ICD-10-CM | POA: Diagnosis not present

## 2022-01-03 DIAGNOSIS — R0789 Other chest pain: Secondary | ICD-10-CM | POA: Diagnosis not present

## 2022-01-03 DIAGNOSIS — I951 Orthostatic hypotension: Secondary | ICD-10-CM | POA: Diagnosis not present

## 2022-01-03 DIAGNOSIS — I639 Cerebral infarction, unspecified: Secondary | ICD-10-CM

## 2022-01-03 DIAGNOSIS — K219 Gastro-esophageal reflux disease without esophagitis: Secondary | ICD-10-CM | POA: Diagnosis present

## 2022-01-03 DIAGNOSIS — Z79899 Other long term (current) drug therapy: Secondary | ICD-10-CM

## 2022-01-03 DIAGNOSIS — Z90710 Acquired absence of both cervix and uterus: Secondary | ICD-10-CM

## 2022-01-03 DIAGNOSIS — G8220 Paraplegia, unspecified: Secondary | ICD-10-CM | POA: Diagnosis present

## 2022-01-03 DIAGNOSIS — E531 Pyridoxine deficiency: Secondary | ICD-10-CM | POA: Diagnosis present

## 2022-01-03 DIAGNOSIS — F419 Anxiety disorder, unspecified: Secondary | ICD-10-CM | POA: Diagnosis present

## 2022-01-03 DIAGNOSIS — I428 Other cardiomyopathies: Secondary | ICD-10-CM | POA: Diagnosis present

## 2022-01-03 DIAGNOSIS — E861 Hypovolemia: Secondary | ICD-10-CM | POA: Diagnosis not present

## 2022-01-03 DIAGNOSIS — Z6822 Body mass index (BMI) 22.0-22.9, adult: Secondary | ICD-10-CM

## 2022-01-03 DIAGNOSIS — E119 Type 2 diabetes mellitus without complications: Secondary | ICD-10-CM | POA: Diagnosis present

## 2022-01-03 DIAGNOSIS — J449 Chronic obstructive pulmonary disease, unspecified: Secondary | ICD-10-CM | POA: Diagnosis present

## 2022-01-03 DIAGNOSIS — R000 Tachycardia, unspecified: Secondary | ICD-10-CM | POA: Diagnosis not present

## 2022-01-03 DIAGNOSIS — F319 Bipolar disorder, unspecified: Secondary | ICD-10-CM | POA: Diagnosis present

## 2022-01-03 DIAGNOSIS — E785 Hyperlipidemia, unspecified: Secondary | ICD-10-CM | POA: Diagnosis present

## 2022-01-03 DIAGNOSIS — E872 Acidosis, unspecified: Secondary | ICD-10-CM | POA: Diagnosis not present

## 2022-01-03 DIAGNOSIS — Z8673 Personal history of transient ischemic attack (TIA), and cerebral infarction without residual deficits: Secondary | ICD-10-CM

## 2022-01-03 DIAGNOSIS — I5022 Chronic systolic (congestive) heart failure: Secondary | ICD-10-CM | POA: Diagnosis present

## 2022-01-03 DIAGNOSIS — I480 Paroxysmal atrial fibrillation: Secondary | ICD-10-CM | POA: Diagnosis present

## 2022-01-03 DIAGNOSIS — E519 Thiamine deficiency, unspecified: Secondary | ICD-10-CM | POA: Diagnosis present

## 2022-01-03 DIAGNOSIS — Z7901 Long term (current) use of anticoagulants: Secondary | ICD-10-CM

## 2022-01-03 DIAGNOSIS — E559 Vitamin D deficiency, unspecified: Secondary | ICD-10-CM | POA: Diagnosis present

## 2022-01-03 LAB — CBC
Absolute NRBC: 0 10*3/uL (ref 0.00–0.00)
Hematocrit: 26.6 % — ABNORMAL LOW (ref 34.7–43.7)
Hgb: 8.6 g/dL — ABNORMAL LOW (ref 11.4–14.8)
MCH: 25.4 pg (ref 25.1–33.5)
MCHC: 32.3 g/dL (ref 31.5–35.8)
MCV: 78.5 fL (ref 78.0–96.0)
MPV: 8.5 fL — ABNORMAL LOW (ref 8.9–12.5)
Nucleated RBC: 0 /100 WBC (ref 0.0–0.0)
Platelets: 418 10*3/uL — ABNORMAL HIGH (ref 142–346)
RBC: 3.39 10*6/uL — ABNORMAL LOW (ref 3.90–5.10)
RDW: 16 % — ABNORMAL HIGH (ref 11–15)
WBC: 8.27 10*3/uL (ref 3.10–9.50)

## 2022-01-03 LAB — BASIC METABOLIC PANEL
Anion Gap: 11 (ref 5.0–15.0)
BUN: 12 mg/dL (ref 7.0–21.0)
CO2: 20 mEq/L (ref 17–29)
Calcium: 9.1 mg/dL (ref 8.5–10.5)
Chloride: 101 mEq/L (ref 99–111)
Creatinine: 0.6 mg/dL (ref 0.4–1.0)
Glucose: 96 mg/dL (ref 70–100)
Potassium: 3.8 mEq/L (ref 3.5–5.3)
Sodium: 132 mEq/L — ABNORMAL LOW (ref 135–145)

## 2022-01-03 LAB — ECG 12-LEAD
Atrial Rate: 122 {beats}/min
P Axis: 49 degrees
P-R Interval: 136 ms
Q-T Interval: 318 ms
QRS Duration: 88 ms
QTC Calculation (Bezet): 453 ms
R Axis: 48 degrees
T Axis: 240 degrees
Ventricular Rate: 122 {beats}/min

## 2022-01-03 LAB — GFR: EGFR: >60

## 2022-01-03 LAB — PROBNP: NT-proBNP: 41 pg/mL (ref 0–125)

## 2022-01-03 MED ORDER — METOPROLOL SUCCINATE ER 50 MG PO TB24
100.0000 mg | ORAL_TABLET | Freq: Every day | ORAL | Status: DC
Start: 2022-01-04 — End: 2022-01-04
  Administered 2022-01-04: 100 mg via ORAL
  Filled 2022-01-03: qty 2

## 2022-01-03 MED ORDER — DIPHENHYDRAMINE HCL 25 MG PO CAPS
25.0000 mg | ORAL_CAPSULE | Freq: Four times a day (QID) | ORAL | Status: DC | PRN
Start: 2022-01-03 — End: 2022-01-03

## 2022-01-03 MED ORDER — ROPINIROLE HCL 1 MG PO TABS
1.0000 mg | ORAL_TABLET | Freq: Every evening | ORAL | Status: DC
Start: 2022-01-03 — End: 2022-01-26
  Administered 2022-01-03 – 2022-01-25 (×23): 1 mg via ORAL
  Filled 2022-01-03 (×23): qty 1

## 2022-01-03 MED ORDER — VITAMIN B-6 50 MG PO TABS
50.0000 mg | ORAL_TABLET | Freq: Every day | ORAL | Status: DC
Start: 2022-01-04 — End: 2022-01-26
  Administered 2022-01-04 – 2022-01-26 (×23): 50 mg via ORAL
  Filled 2022-01-03 (×23): qty 1

## 2022-01-03 MED ORDER — SENNOSIDES-DOCUSATE SODIUM 8.6-50 MG PO TABS
2.0000 | ORAL_TABLET | Freq: Two times a day (BID) | ORAL | 0 refills | Status: DC | PRN
Start: 2022-01-03 — End: 2022-01-26

## 2022-01-03 MED ORDER — MELATONIN 3 MG PO TABS
3.0000 mg | ORAL_TABLET | Freq: Every evening | ORAL | Status: DC | PRN
Start: 2022-01-03 — End: 2022-01-26
  Administered 2022-01-03 – 2022-01-21 (×6): 3 mg via ORAL
  Filled 2022-01-03 (×6): qty 1

## 2022-01-03 MED ORDER — BISACODYL 5 MG PO TBEC
10.0000 mg | DELAYED_RELEASE_TABLET | Freq: Every day | ORAL | Status: DC | PRN
Start: 2022-01-03 — End: 2022-01-26
  Administered 2022-01-09 – 2022-01-22 (×5): 10 mg via ORAL
  Filled 2022-01-03 (×6): qty 2

## 2022-01-03 MED ORDER — POTASSIUM CHLORIDE CRYS ER 20 MEQ PO TBCR
40.0000 meq | EXTENDED_RELEASE_TABLET | Freq: Every day | ORAL | Status: DC
Start: 2022-01-04 — End: 2022-01-14
  Administered 2022-01-04 – 2022-01-14 (×11): 40 meq via ORAL
  Filled 2022-01-03 (×11): qty 2

## 2022-01-03 MED ORDER — NORTRIPTYLINE HCL 10 MG PO CAPS
10.0000 mg | ORAL_CAPSULE | Freq: Every evening | ORAL | Status: DC
Start: 2022-01-03 — End: 2022-01-05
  Administered 2022-01-03 – 2022-01-04 (×2): 10 mg via ORAL
  Filled 2022-01-03 (×3): qty 1

## 2022-01-03 MED ORDER — LIDOCAINE 5 % EX PTCH
1.0000 | MEDICATED_PATCH | CUTANEOUS | Status: DC
Start: 2022-01-04 — End: 2022-01-26
  Administered 2022-01-04 – 2022-01-26 (×9): 1 via TRANSDERMAL
  Filled 2022-01-03 (×18): qty 1

## 2022-01-03 MED ORDER — PYRIDOXINE HCL 50 MG PO TABS
50.0000 mg | ORAL_TABLET | Freq: Every day | ORAL | 0 refills | Status: DC
Start: 2022-01-04 — End: 2023-06-21

## 2022-01-03 MED ORDER — DIPHENHYDRAMINE HCL 25 MG PO CAPS
25.0000 mg | ORAL_CAPSULE | Freq: Four times a day (QID) | ORAL | 0 refills | Status: DC | PRN
Start: 2022-01-03 — End: 2022-02-07

## 2022-01-03 MED ORDER — THIAMINE HCL 100 MG PO TABS
100.0000 mg | ORAL_TABLET | Freq: Every day | ORAL | 0 refills | Status: DC
Start: 2022-01-04 — End: 2024-01-22

## 2022-01-03 MED ORDER — VITAMINS/MINERALS PO TABS
1.0000 | ORAL_TABLET | Freq: Every day | ORAL | Status: DC
Start: 2022-01-04 — End: 2022-01-09
  Administered 2022-01-04 – 2022-01-09 (×6): 1 via ORAL
  Filled 2022-01-03 (×6): qty 1

## 2022-01-03 MED ORDER — ACETAMINOPHEN 325 MG PO TABS
650.0000 mg | ORAL_TABLET | ORAL | Status: DC | PRN
Start: 2022-01-03 — End: 2022-01-26
  Administered 2022-01-04 – 2022-01-17 (×6): 650 mg via ORAL
  Filled 2022-01-03 (×6): qty 2

## 2022-01-03 MED ORDER — METOPROLOL SUCCINATE ER 50 MG PO TB24
100.0000 mg | ORAL_TABLET | Freq: Every day | ORAL | Status: DC
Start: 2022-01-04 — End: 2022-01-03

## 2022-01-03 MED ORDER — LOSARTAN POTASSIUM 25 MG PO TABS
25.0000 mg | ORAL_TABLET | Freq: Every day | ORAL | 0 refills | Status: DC
Start: 2022-01-04 — End: 2022-01-26

## 2022-01-03 MED ORDER — APIXABAN 5 MG PO TABS
5.0000 mg | ORAL_TABLET | Freq: Two times a day (BID) | ORAL | Status: DC
Start: 2022-01-03 — End: 2022-01-26
  Administered 2022-01-04 – 2022-01-26 (×46): 5 mg via ORAL
  Filled 2022-01-03 (×45): qty 1

## 2022-01-03 MED ORDER — HYDROMORPHONE HCL 2 MG PO TABS
2.0000 mg | ORAL_TABLET | Freq: Four times a day (QID) | ORAL | 0 refills | Status: DC | PRN
Start: 2022-01-03 — End: 2022-01-26

## 2022-01-03 MED ORDER — SENNOSIDES-DOCUSATE SODIUM 8.6-50 MG PO TABS
2.0000 | ORAL_TABLET | Freq: Every evening | ORAL | Status: DC
Start: 2022-01-03 — End: 2022-01-26
  Administered 2022-01-06 – 2022-01-25 (×18): 2 via ORAL
  Filled 2022-01-03 (×23): qty 2

## 2022-01-03 MED ORDER — ALBUTEROL SULFATE HFA 108 (90 BASE) MCG/ACT IN AERS
2.0000 | INHALATION_SPRAY | Freq: Four times a day (QID) | RESPIRATORY_TRACT | 0 refills | Status: AC | PRN
Start: 2022-01-03 — End: ?

## 2022-01-03 MED ORDER — TERAZOSIN HCL 1 MG PO CAPS
2.0000 mg | ORAL_CAPSULE | Freq: Every evening | ORAL | Status: DC
Start: 2022-01-03 — End: 2022-01-04
  Administered 2022-01-03: 23:00:00 2 mg via ORAL
  Filled 2022-01-03: qty 2

## 2022-01-03 MED ORDER — FERROUS SULFATE 324 (65 FE) MG PO TBEC
324.0000 mg | DELAYED_RELEASE_TABLET | ORAL | 0 refills | Status: AC
Start: 2022-01-03 — End: ?

## 2022-01-03 MED ORDER — NYSTATIN 100000 UNIT/ML MT SUSP
500000.0000 [IU] | Freq: Four times a day (QID) | OROMUCOSAL | 0 refills | Status: DC
Start: 2022-01-03 — End: 2022-01-26

## 2022-01-03 MED ORDER — CALCIUM CARBONATE ANTACID 500 MG PO CHEW
1000.0000 mg | CHEWABLE_TABLET | Freq: Four times a day (QID) | ORAL | 0 refills | Status: AC | PRN
Start: 2022-01-03 — End: ?

## 2022-01-03 MED ORDER — LOSARTAN POTASSIUM 25 MG PO TABS
25.0000 mg | ORAL_TABLET | Freq: Every day | ORAL | Status: DC
Start: 2022-01-04 — End: 2022-01-04
  Filled 2022-01-03: qty 1

## 2022-01-03 MED ORDER — VITAMIN D 25 MCG (1000 UT) PO TABS
25.0000 ug | ORAL_TABLET | Freq: Every day | ORAL | Status: DC
Start: 2022-01-04 — End: 2022-01-26
  Administered 2022-01-04 – 2022-01-26 (×23): 25 ug via ORAL
  Filled 2022-01-03 (×23): qty 1

## 2022-01-03 MED ORDER — ONDANSETRON 4 MG PO TBDP
4.0000 mg | ORAL_TABLET | Freq: Four times a day (QID) | ORAL | 0 refills | Status: DC | PRN
Start: 2022-01-03 — End: 2022-02-07

## 2022-01-03 MED ORDER — METOPROLOL SUCCINATE ER 100 MG PO TB24
100.0000 mg | ORAL_TABLET | Freq: Every day | ORAL | 0 refills | Status: DC
Start: 2022-01-04 — End: 2022-01-26

## 2022-01-03 MED ORDER — GABAPENTIN 100 MG PO CAPS
100.0000 mg | ORAL_CAPSULE | Freq: Three times a day (TID) | ORAL | Status: DC | PRN
Start: 2022-01-03 — End: 2022-01-26
  Administered 2022-01-14 – 2022-01-26 (×10): 100 mg via ORAL
  Filled 2022-01-03 (×10): qty 1

## 2022-01-03 MED ORDER — PANTOPRAZOLE SODIUM 40 MG PO TBEC
40.0000 mg | DELAYED_RELEASE_TABLET | Freq: Every morning | ORAL | Status: DC
Start: 2022-01-04 — End: 2022-01-26
  Administered 2022-01-04 – 2022-01-26 (×23): 40 mg via ORAL
  Filled 2022-01-03 (×26): qty 1

## 2022-01-03 MED ORDER — BUDESONIDE-FORMOTEROL FUMARATE 80-4.5 MCG/ACT IN AERO
2.0000 | INHALATION_SPRAY | Freq: Two times a day (BID) | RESPIRATORY_TRACT | Status: DC
Start: 2022-01-03 — End: 2022-01-26
  Administered 2022-01-04 – 2022-01-26 (×34): 2 via RESPIRATORY_TRACT
  Filled 2022-01-03 (×2): qty 6.9

## 2022-01-03 MED ORDER — VITAMINS/MINERALS PO TABS
1.0000 | ORAL_TABLET | Freq: Every day | ORAL | 0 refills | Status: AC
Start: 2022-01-04 — End: ?

## 2022-01-03 MED ORDER — NYSTATIN 100000 UNIT/ML MT SUSP
500000.0000 [IU] | Freq: Four times a day (QID) | OROMUCOSAL | Status: DC
Start: 2022-01-03 — End: 2022-01-03
  Administered 2022-01-03 (×3): 500000 [IU] via ORAL
  Filled 2022-01-03 (×3): qty 5

## 2022-01-03 MED ORDER — CHOLECALCIFEROL 25 MCG (1000 UT) PO TABS
25.0000 ug | ORAL_TABLET | Freq: Every day | ORAL | 0 refills | Status: DC
Start: 2022-01-04 — End: 2023-06-21

## 2022-01-03 MED ORDER — THIAMINE (VITAMIN B1) 100 MG PO TABS (WRAP)
100.0000 mg | ORAL_TABLET | Freq: Every day | ORAL | Status: DC
Start: 2022-01-04 — End: 2022-01-26
  Administered 2022-01-04 – 2022-01-26 (×23): 100 mg via ORAL
  Filled 2022-01-03 (×23): qty 1

## 2022-01-03 MED ORDER — BUDESONIDE-FORMOTEROL FUMARATE 80-4.5 MCG/ACT IN AERO
1.0000 | INHALATION_SPRAY | Freq: Two times a day (BID) | RESPIRATORY_TRACT | Status: DC
Start: 2022-01-03 — End: 2022-01-03
  Administered 2022-01-03: 21:00:00 1 via RESPIRATORY_TRACT

## 2022-01-03 MED ORDER — MELATONIN 3 MG PO TABS
3.0000 mg | ORAL_TABLET | Freq: Every evening | ORAL | 0 refills | Status: DC | PRN
Start: 2022-01-03 — End: 2022-01-26

## 2022-01-03 MED ORDER — HYDROMORPHONE HCL 2 MG PO TABS
2.0000 mg | ORAL_TABLET | Freq: Four times a day (QID) | ORAL | Status: DC | PRN
Start: 2022-01-03 — End: 2022-01-26
  Administered 2022-01-03 – 2022-01-26 (×55): 2 mg via ORAL
  Filled 2022-01-03 (×55): qty 1

## 2022-01-03 MED ORDER — MEGESTROL ACETATE 400 MG/10ML PO SUSP
400.0000 mg | Freq: Two times a day (BID) | ORAL | 0 refills | Status: DC
Start: 2022-01-03 — End: 2022-01-03

## 2022-01-03 MED ORDER — NYSTATIN 100000 UNIT/ML MT SUSP
500000.0000 [IU] | Freq: Four times a day (QID) | OROMUCOSAL | Status: AC
Start: 2022-01-03 — End: 2022-01-13
  Administered 2022-01-04 – 2022-01-13 (×36): 500000 [IU] via ORAL
  Filled 2022-01-03 (×37): qty 5

## 2022-01-03 MED ORDER — METOPROLOL SUCCINATE ER 50 MG PO TB24
25.0000 mg | ORAL_TABLET | Freq: Once | ORAL | Status: AC
Start: 2022-01-03 — End: 2022-01-03
  Administered 2022-01-03: 10:00:00 25 mg via ORAL
  Filled 2022-01-03: qty 1

## 2022-01-03 MED ORDER — ONDANSETRON 4 MG PO TBDP
4.0000 mg | ORAL_TABLET | Freq: Four times a day (QID) | ORAL | Status: DC | PRN
Start: 2022-01-03 — End: 2022-01-07
  Administered 2022-01-04 – 2022-01-06 (×3): 4 mg via ORAL
  Filled 2022-01-03 (×3): qty 1

## 2022-01-03 MED ORDER — PATIENT SUPPLIED NON FORMULARY
120.0000 mg | Freq: Every day | Status: DC
Start: 2022-01-04 — End: 2022-01-05

## 2022-01-03 MED ORDER — LACTULOSE 10 GM/15ML PO SOLN
20.0000 g | ORAL | Status: DC | PRN
Start: 2022-01-03 — End: 2022-01-26
  Administered 2022-01-08 – 2022-01-22 (×6): 20 g via ORAL
  Filled 2022-01-03 (×8): qty 30

## 2022-01-03 MED ORDER — LIDOCAINE 5 % EX PTCH
1.0000 | MEDICATED_PATCH | CUTANEOUS | 0 refills | Status: DC
Start: 2022-01-03 — End: 2022-01-26

## 2022-01-03 MED ORDER — NORTRIPTYLINE HCL 10 MG PO CAPS
10.0000 mg | ORAL_CAPSULE | Freq: Every evening | ORAL | 0 refills | Status: DC
Start: 2022-01-03 — End: 2022-02-07

## 2022-01-03 NOTE — Progress Notes (Signed)
[facility] Progress Note  NP/PA Spectra [phone] and [phone]  Patient of Dr. 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 Presented as below for weakness concerning for myositis. Echo was done which showed newly reduced EF of 43% with global hypokinesis  EKG on admission sinus tach. Etiology of newly reduced EF unclear but is non ischemic given Cath 2022 with no obstructive CAD.     - no evidence of volume overload on exam  - GDMT: continue Toprol 75, - continue losartan 25mg  PO daily  - given recent LHC with no obstructive CAD no plans for further ischemic workup in house. Repeat echo as OP  - echo 01/01/22  EF 43 %. mild aortic regurgitation. There is a venous catheter visualized in the right atrium.  -outpatient workup for NICM causes      Sinus Tachycardia  Remains in sinus tachycardia. CTA negative for PE. No significant anemia/BP stable so less likely compensatory. Unsure of sinus tachycardia etiology.     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   3/6 EMG showing a myopathic process affecting proximal bilateral lower limb muscles  MRI Thighs with Mild edema and atrophy involving the semimembranosus and biceps femoris muscles of the right and left thighs with mild enhancement.  Mild edema is noted in the anterior compartment muscles of the right and left thighs with mild enhancement.  In addition, mild edema is noted in the proximal abductor longus and abductor magnus muscles bilaterally.     - neurology following      Hx of CP possible Microvascular angina  Cardiac catheterization in 2014 in 2017 and 2022  negative for CAD. Echo 08/2021 EF: 55-60. Cardiac PET stress test 08/27/2019 was normal with SDS score of 1, MBFR 1.88, EF 54%. Diagnosis of possible microvascular angina being treated with Ranexa as OP (severe HA with nitrates).     - stable, denies chest pain     Paroxysmal Atrial fibrillation CHA2DS2-VASc: 5 s/p LINQ monitor and on Eliquis 5 mg BID  - continue Eliquis 5 BID and Toprol 50  - sinus tach on tele      Hx of Transaminitis   Diagnosed during ED visit on 12/11/2021. Thought to be 2/2 rhabdomyolysis. Statins were held. Viral hepatitis panel was negative. Right upper quadrant ultrasound normal.  - continue to hold statin     HTN  As above     AI   Mild to moderate aortic insufficiency by TEE in 2017 and 2D echo 08/2021. Continue to monitor.  Echo 12/2021 mild AI     HLD   Statins held due to history of transaminitis      Hx of Anemia  Hx of prior GI bleed (GI workup with a PillCam has shown small intestine bleeding,  But a repeat evaluation does not indicate any active bleeding and patient is back on Eliquis 5 b.i.d..)  -Hg ranging from 7.5 - 9 this admission. Continue Eliquis 5 BID  Patient Active Problem List    Diagnosis Date Noted    *HWeakness of both legs [R29.898] 12/25/2021    HHypokalemia [E87.6] 12/26/2021    HThoracic back pain [M54.6] 12/26/2021    HGastroparesis [K31.84] 12/26/2021    HGuillain Barr syndrome [G61.0] 12/25/2021    History of asthma [Z87.09] 06/16/2019    History of transient ischemic attack (TIA) [Z86.73] 06/16/2019    HType 2 diabetes mellitus [E11.9] 06/16/2019    Syncope and collapse [R55] 06/16/2019    Left-sided weakness [R53.1] 05/20/2019    Chest pain with moderate risk for cardiac etiology [R07.9] 05/11/2019    Non-traumatic subcutaneous emphysema [J98.2] 01/07/2019    Chest pain with high risk of acute coronary syndrome [R07.9] 01/06/2019    HParoxysmal atrial fibrillation [I48.0] 01/06/2019    Hypertension [I10] 01/06/2019    Hyperlipidemia [E78.5] 01/06/2019     Seizure disorder [G40.909] 01/06/2019    History of gastrointestinal hemorrhage [Z87.19] 01/06/2019    Depression [F32.A] 01/06/2019    Anemia [D64.9] 01/06/2019    Asthma [J45.909] 01/06/2019    Chest pain [R07.9] 08/22/2018    CVA (cerebral vascular accident) [I63.9] 08/21/2018       Subjective:   Reports feeling well. Did endorse some chest pain when first waking up this morning but it only lasted "a few seconds". Continues ot endorse palpitations.   Denies SOB    Prior Cardiac History/Testing:      TTE 12/07/2021:  CONCLUSIONS     * Left ventricular systolic function is normal with an ejection fraction of   56 % by 2D Teichholz.     * Left ventricular chamber size is normal.     * Left ventricular diastolic function: normal.     * Right ventricular systolic function is normal with TAPSE measuring 2.95   cm.     * Right ventricular chamber dimension is normal.     * There is mild mitral valve regurgitation.      TTE 11/19/2021:  CONCLUSIONS     * Left ventricular systolic function is normal with an ejection fraction of   55 % by Simpson's biplane.     * Left ventricular chamber size is normal.     * Left ventricular diastolic function: Grade I diastolic dysfunction.     * Right ventricular systolic function is normal with TAPSE measuring 2.69   cm.     * Right ventricular chamber dimension is normal.     * There is mild diffuse thickening (sclerosis) of the aortic valve cusps.     * There is mild to moderate aortic valve regurgitation.     * Mitral valve has mild annular calcification.     * No pulmonary hypertension, estimated pulmonary arterial systolic pressure   is 26 mmHg.     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     * There is mild tricuspid valve regurgitation.       Heart catheterization 07/26/2021      1. Normal coronary arteries   2. Hypertension   3. PAF        PET/CT MPS 08/27/2019  1. This is a normal PET myocardial perfusion study. PET MPI reveals  normal myocardial perfusion with no evidence of ischemia or scar.  Summed difference score (SDS) is 1.   2. Left ventricular ejection fraction is calculated to be 54% with normal left ventricular function with no wall motion abnormalities at rest.  3. Blood flow quantification demonstrates a global myocardial blood flow reserve (MBFR) of 1.88.        Venous Doppler lower extremity 06/05/2019 no DVT and no evidence of reflux except left GSV with greater than half a 2nd reflux. Leg edema stable with elevation and compression stockings     Cardiac PET stress test 08/27/2019 was normal with SDS score of 1, MBFR 1.88, EF 54%..    blood from 01/11/2020 hematocrit 32.4, A1c 6.0, BUN /creatinine 18/0.9, K 4.3, LDL 57, HDL 62, TG 52 on Lipitor 40 q.d.     Venous Doppler of lower extremities 04/11/2020 no DVT, minor reflux in left GSV.       Venous Doppler of the lower extremities 11/14/2020 indicated no DVT, moderate bilateral reflux in the deep veins and GSV.   Going for venous ablation by vascular Department.   patient was admitted to [facility] 08/24/2021 with TIA like symptoms and the workup apparently was unremarkable.  Echo done there on 09/02/2021 indicated EF 55%.  Creatinine was initially elevated but after hydration went back to normal.   09/27/21 ER visit due to headache and nausea with elevated BP to 158/101. Potassium 2.7.  She notes she stopped taking Terazosin 2 mg daily 2 weeks ago and symptoms of nausea started with cessation of medication. BP at home has been 140s to 150s systolic.  She is to follow up with GI.  No blood in stool or emesis.  Tolerating orals.  Recommend resume Terazosin.  Monitor BP as well as BMP given hypokalemia in ER.        12/21/2021: Patient presents today for f/u s/p [place] from admission for rhabdomyolysis and leg swelling.     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 Echo 08/2021 EF: 55-60. Cardiac PET stress test  08/27/2019 was normal with SDS score of 1, MBFR 1.88, EF 54%. Diagnosis of possible microvascular angina being treated with Ranexa (severe HA with nitrates). Cr: 0.5.    Physical Exam:     Vitals:    01/03/22 0736   BP: 129/69   Pulse: (!) 125   Resp: 18   Temp: 98.2 F (36.8 C)   SpO2: 98%     Temp (24hrs), Avg:98.4 F (36.9 C), Min:98.1 F (36.7 C), Max:99.1 F (37.3 C)    Weight: 72.6 kg (160 lb)    Intake and Output Summary (Last 24 hours) at Date Time    Intake/Output Summary (Last 24 hours) at 01/03/2022 0757  Last data filed at 01/03/2022 0317  Gross per 24 hour   Intake 1346 ml   Output 1650 ml   Net -304 ml       General:  Well-nourished, well-developed.  Alert and in no apparent distress.  Eyes:  Anicteric sclera.  Pupils equal and round.    ENT:  Hearing grossly intact. Lips moist, color appropriate for race.  Neck:  2+ carotids bilaterally with normal upstroke; no carotid bruits.  No JVD.   Lungs:  Clear to auscultation bilaterally. No wheezes or crackles.  Respiratory effort unlabored, chest expansion symmetric.  Cardiovascular:  PMI non-displaced.  No tenderness to palpation.  Tachycardic and regular rhythm. Normal S1 and S2.  No S3 or S4.  No murmurs or rubs.  Abdomen:  Soft, non-tender, non-distended.  No organomegaly.  No pulsatile masses.  Extremities:  2+ bilateral radial and pedal pulses.  No LE edema.  Skin:  Warm, dry, and intact. No  rashes or lesions on exposed portions of skin.  Neurologic:  Moving all four extremities; exam grossly non-focal    Medications:       Current Facility-Administered Medications   Medication Dose Route Frequency    apixaban  5 mg Oral Q12H SCH    bisacodyl  5 mg Oral Daily    budesonide-formoterol  2 puff Inhalation BID    lidocaine  1 patch Transdermal Q24H    losartan  25 mg Oral Daily    lurasidone  120 mg Oral Daily    megestrol  400 mg Oral BID Meals    metoprolol succinate XL  75 mg Oral Daily    nortriptyline  10 mg Oral QHS    pantoprazole  40 mg Oral  Daily    potassium chloride  40 mEq Oral BID    rOPINIRole  1 mg Oral QHS    terazosin  2 mg Oral QHS    thiamine  100 mg Oral Daily    vitamin B-6  50 mg Oral Daily    vitamin D (cholecalciferol)  25 mcg Oral Daily    vitamins/minerals  1 tablet Oral Daily       Labs:     Recent CMP   Recent Labs     01/03/22  0312   Glucose 96   BUN 12.0   Creatinine 0.6   Sodium 132*   Potassium 3.8   CO2 20   Calcium 9.1     Recent CARDIAC ENZYMES No results for input(s): TROPONIN, ISTATTROPONI, CK in the last 24 hours.    Invalid input(s): TROPONINT, CKMB[24  Recent TSH Invalid input(s): TSH[24  Recent PT/PTT No results for input(s): INR, PTT in the last 24 hours.    Invalid input(s): PTI, COUM, COUMP, ACOAG, ACOAP[24  Recent CBC WITH DIFF   Recent Labs     01/03/22  0312   RBC 3.39*   Hgb 8.6*   Hematocrit 26.6*   MCV 78.5   MCHC 32.3   RDW 16*   MPV 8.5*   Platelets 418*     Recent LIPID PANEL No results for input(s): CHOL, TRIG, HDL in the last 24 hours.    Invalid input(s): LDLC, VLDLC, LRAT[24    Rads:     Radiology Results (24 Hour)       ** No results found for the last 24 hours. **            Cardiographics:       Telemetry:  sinus tachycardia 120s

## 2022-01-03 NOTE — Discharge Summary -  Nursing (Signed)
DISCHARGE NOTE      [name], [name] (66 y.o., 1956-04-21)  Admission Date: 12/25/2021 (LOS: 9)    Discharge Date: 01/03/22  Discharge Disposition: [facility]  Report Given: given by dayshift RN,  Tedwina RN  Transportation Method: stretcher @2113   Discharge Medication Education Provided: transfer to acute rehab   Additional Discharge Info:   Foley placed at 2035 with Natalia Leatherwood RN   Through peri care done prior to foley insertion.   Pt sent down with Belongings   Peripheral IV removed,catheter intact.   Pt Daughter Morrell Riddle called and updated.     Discharge Medication List       Medication List        START taking these medications      calcium carbonate 500 MG chewable tablet  Commonly known as: TUMS  Chew 2 tablets (1,000 mg) by mouth every 6 (six) hours as needed for Heartburn     cholecalciferol 25 MCG (1000 UT) tablet  Commonly known as: vitamin D3  Take 1 tablet (25 mcg) by mouth daily  Start taking on: January 04, 2022     diphenhydrAMINE 25 mg capsule  Commonly known as: BENADRYL  Take 1 capsule (25 mg) by mouth every 6 (six) hours as needed for Itching     ferrous sulfate 324 (65 FE) MG Tbec  Take 1 tablet (324 mg) by mouth every other day     HYDROmorphone 2 MG tablet  Commonly known as: DILAUDID  Take 1 tablet (2 mg) by mouth every 6 (six) hours as needed for Pain     lidocaine 5 %  Commonly known as: LIDODERM  Place 1 patch onto the skin every 24 hours Remove & Discard patch within 12 hours or as directed by MD     losartan 25 MG tablet  Commonly known as: COZAAR  Take 1 tablet (25 mg) by mouth daily  Start taking on: January 04, 2022     melatonin 3 mg tablet  Take 1 tablet (3 mg) by mouth nightly as needed for Sleep     nortriptyline 10 MG capsule  Commonly known as: PAMELOR  Take 1 capsule (10 mg) by mouth nightly     nystatin 100000 UNIT/ML suspension  Commonly known as: MYCOSTATIN  Take 5 mLs (500,000 Units) by mouth 4 (four) times daily     ondansetron 4 MG disintegrating tablet  Commonly known as:  ZOFRAN-ODT  Take 1 tablet (4 mg) by mouth every 6 (six) hours as needed for Nausea     pyridoxine 50 MG tablet  Commonly known as: B-6  Take 1 tablet (50 mg) by mouth daily  Start taking on: January 04, 2022     senna-docusate 8.6-50 MG per tablet  Commonly known as: PERICOLACE  Take 2 tablets by mouth 2 (two) times daily as needed for Constipation     thiamine 100 MG tablet  Commonly known as: B-1  Take 1 tablet (100 mg) by mouth daily  Start taking on: January 04, 2022     vitamins/minerals Tabs  Take 1 tablet by mouth daily  Start taking on: January 04, 2022            CHANGE how you take these medications      albuterol sulfate HFA 108 (90 Base) MCG/ACT inhaler  Commonly known as: PROVENTIL  Inhale 2 puffs into the lungs every 6 (six) hours as needed for Wheezing  What changed:   how much to take  when to take this  reasons to take this  additional instructions     Latuda 120 MG Tabs  Generic drug: lurasidone  What changed: Another medication with the same name was removed. Continue taking this medication, and follow the directions you see here.     metoprolol succinate 100 MG 24 hr tablet  Commonly known as: TOPROL-XL  Take 1 tablet (100 mg) by mouth daily  Start taking on: January 04, 2022  What changed:   medication strength  how much to take            CONTINUE taking these medications      acetaminophen 325 MG tablet  Commonly known as: TYLENOL  Take 2 tablets (650 mg total) by mouth every 4 (four) hours as needed for Pain     apixaban 5 MG  Commonly known as: ELIQUIS  Take 1 tablet (5 mg total) by mouth every 12 (twelve) hours     budesonide-formoterol 80-4.5 MCG/ACT inhaler  Commonly known as: SYMBICORT     butalbital-acetaminophen-caffeine 50-325-40 MG per tablet  Commonly known as: FIORICET  Take 1 tablet by mouth every 6 (six) hours as needed for Headaches     pantoprazole 40 MG tablet  Commonly known as: PROTONIX  Take 1 tablet (40 mg total) by mouth daily     QUEtiapine 50 MG tablet  Commonly known as:  SEROquel  Take 1 tablet (50 mg total) by mouth nightly     rOPINIRole 1 MG tablet  Commonly known as: REQUIP  Take 1 tablet (1 mg total) by mouth nightly     terazosin 2 MG capsule  Commonly known as: HYTRIN  Take 1 capsule (2 mg total) by mouth nightly     Trintellix 10 MG Tabs tablet  Generic drug: vortioxetine            STOP taking these medications      amLODIPine 2.5 MG tablet  Commonly known as: NORVASC     Ativan 0.5 MG tablet  Generic drug: LORazepam     atorvastatin 40 MG tablet  Commonly known as: LIPITOR     furosemide 20 MG tablet  Commonly known as: LASIX     furosemide 40 MG tablet  Commonly known as: LASIX     lamoTRIgine 150 MG tablet  Commonly known as: LaMICtal     Motegrity 2 MG Tabs  Generic drug: Prucalopride Succinate     Ozempic (1 MG/DOSE) 4 MG/3ML Sopn  Generic drug: Semaglutide (1 MG/DOSE)               Where to Get Your Medications        These medications were sent to [id number] [place], [place] - [address] [facility] AT [facility] [address]  [address] [place], [place] [address]      Phone: [phone]   cholecalciferol 25 MCG (1000 UT) tablet  diphenhydrAMINE 25 mg capsule  pyridoxine 50 MG tablet  thiamine 100 MG tablet  vitamins/minerals Tabs       You can get these medications from any pharmacy    Bring a paper prescription for each of these medications  HYDROmorphone 2 MG tablet       Information about where to get these medications is not yet available    Ask your nurse or doctor about these medications  albuterol sulfate HFA 108 (90 Base) MCG/ACT inhaler  calcium carbonate 500 MG chewable tablet  ferrous sulfate 324 (65 FE) MG Tbec  lidocaine 5 %  losartan 25  MG tablet  melatonin 3 mg tablet  metoprolol succinate 100 MG 24 hr tablet  nortriptyline 10 MG capsule  nystatin 100000 UNIT/ML suspension  ondansetron 4 MG disintegrating tablet  senna-docusate 8.6-50 MG per tablet

## 2022-01-03 NOTE — OT Progress Note (Signed)
Occupational Therapy Treatment  [name]        Post Acute Care Therapy Recommendations:     Discharge Recommendations:  Acute Rehab    If Acute Rehab  recommended discharge disposition is not available, patient will need max assist for ADLs/transfers and HHOT.     DME needs IF patient is discharging home: [facility], Hospital bed, [facility]    Therapy discharge recommendations may change with patient status.  Please refer to most recent note for up-to-date recommendations.    Patient anticipated to benefit from and to be able to engage in 3 hours of therapy a day for 5 days a week.     Assessment:   Significant Findings: None    Pt received in the bed, agreeable to OT treatment session. Pt making good progress towards goals. Pt with improved transfers and mobility with RW today, demonstrating improved balance and decreased ataxia. Pt completes grooming tasks at the sink, initially CGA for standing balance however required increased assist with time and eventually modification to complete remainder of task from seated level as pt HR noted to increase to 150s after increased time standing. Reinforced seated HEP while pt is seated in the chair and emphasized importance of regular OOB activity/mobility with nursing staff as able to maintain progress. Pt verbalized understanding. Pt left in the chair with all needs in reach, all questions answered. Will continue to benefit from skilled OT to maximize functional independence and address deficits.      Treatment Activities: bed mobility, transfer training, functional mobility, ADL, education    Educated the patient to role of occupational therapy, plan of care, goals of therapy and HEP, safety with mobility and ADLs, home safety.    Plan:    OT Frequency Recommended: 4-5x/wk     Continue plan of care.    Unit: [facility] TOWER 7  Bed: F745/F745.01       Precautions and Contraindications:   Falls    Updated Medical  Status/Imaging/Labs:  Reviewed    Subjective: "I don't want to stay here longer"    Patient's medical condition is appropriate for Occupational Therapy intervention at this time.  Patient is agreeable to participation in the therapy session. Nursing clears patient for therapy.    Pain:   Denies     Objective:   Patient is in bed with PIV access, external catheter in place.  Pt wore mask during therapy session:No      Cognition  A/Ox3  Answers questions appropriately: yes  Command Following: good     Functional Mobility  Rolling: SBA  Supine to Sit: SBA   Sit to Stand: minA with RW  Transfers: minA with RW  Functional Mobility: min-modA with RW, ataxic, scissoring, needs cues to use visual compensation for step pattern     PMP Activity: Step 6 - Walks in Room    Balance  Static Sitting: good  Dynamic Sitting: good  Static Standing: fair+ with BUE support, fair/fair- with fatigue  Dynamic Standing: fair with RW     Self Care and Home Management  Eating: independent  Grooming: initially CGA at sink for standing balance, required modification to complete from seated 2/2 decreased standing balance   Bathing: modA from seated   UE Dressing: SBA  LE Dressing: SBA adjusting socks from seated EOB  Toileting: maxA    Therapeutic Exercises/Activities  Incorporated into ADLs    Participation: good   Endurance: good    Patient left with call bell within  reach, all needs met, SCDs off, fall mat in place, bed alarm n/a, chair alarm on and all questions answered. RN notified of session outcome and patient response.     Goals:  Time For Goal Achievement: 7 visits  ADL Goals  Patient will groom self: Minimal Assist, at sinkside, 7 visits  Patient will dress lower body: Goal met  Mobility and Transfer Goals  Pt will perform functional transfers: Stand by Assist, with rolling walker, 5 visits, New goal  Neuro Re-Ed Goals  Pt will perform dynamic sitting balance: Goal met  Other Goal: pt will demonstrate use of visual compensatory  methods 2/2 decreased LE sensation           Vision Goals  Pt with diplopia will complete ADLs with adaptive equipment: Discontinued (comment) (diplopia resolved)            PPE worn during session: procedural mask and gloves    Tech present: n/a  PPE worn by tech: N/A    Enid Derry, OTR/L  Pager [phone]      Time of Treatment  OT Received On: 01/03/22  Start Time: 1135  Stop Time: 1203  Time Calculation (min): 28 min    Treatment # 5 of 7 visits

## 2022-01-03 NOTE — Progress Notes (Signed)
[facility] Note:     Insurance authorization for acute rehab admission at [facility] received from insurance.  Clydie Braun, Rehab Liaison, notified.      Willette Cluster RN, BSN, CCM  Rehab Admissions Liaison/Insurance [employer] [facility]  [facility]

## 2022-01-03 NOTE — Progress Notes (Signed)
CMA set up transport for 4 PM    Laural Roes  Case Management Assistant  Case Management Department  [facility]  P: [phone] [email or web address]

## 2022-01-03 NOTE — H&P (Signed)
IRF Post-Admission Assessment  History and Physical    Patient Name: [name], [name]  DOB:  12-Aug-1956    Reason for admission: myositis causing deficits of mobility and ADLs     Date of admission: 01/03/2022     History of present illness:   This 66 y.o. year old right hand-dominant female   hx afib on Eliquis, HTN, HLD, DM, COPD, TIAs, COPD, bipolar disorder, [facility] admit 2/7-2/17/23 for rhabdomyolysis (peak CK 4464 - statin d/c'ed) + vomiting ?gastroparesis + starvation ketoacidosis + UTI, [facility] admit 2/18-2/20/23 for rhabdomyolysis (peak CK 2509) + transaminitis + bilateral leg weakness + right paresthesias, [facility] ER visit 3/2 for bilateral leg weakness who presents to [facility] with difficulty standing and walking, bilateral leg/hand weakness, bilateral leg/hand/face paresthesias. She noted bilateral leg weakness with her prior [facility] admission 2/7-2/17 and was readmitted for bilateral leg weakness 2/18-2/20. The past 4 days she has had difficulty standing and walking. The past 4 days she notes new bilateral hand and face paresthesias and bilateral hand weakness. She has some shortness of breath but is speaking in full sentences. She went to [facility] ER 3/2 and was discharged. She saw her PMD who advised she go to an [facility]. At [facility] she got MRI brain and CTL-spine which did not show a cause for her symptoms. [facility] called neurology Dr. Francesco Sor who said to consider IVIG for 5 days, LP, EMG. The LP was done at [facility]. Dr. Francesco Sor advised transfer to [facility]. These symptoms are sudden onset, moderate intensity, without alleviating factors.  Following admission, patient was seen by neurology consult who performed MRI of the entire spine and brain, without contrast which came back to show no major issues except on the lumbar area L4-L5 disc disease with facet arthrosis and degenerative changes, neurology team recommended LP and CSF studies so far are unrevealing, patient continued to complain of having bandlike pain around  the epigastric area & in the back, there was tenderness on the right side of the thoracic vertebral area in the muscle, Lidoderm patch applied, patient is getting Protonix and Maalox, has not had a bowel movement for that reason given lactulose and stool softeners.  Patient was [facility] from [facility] for consideration of IVIG with a suspicion of GBS, EMG currently pending.   Due to her history of not eating well and has been losing weight over the past few weeks, neurology team felt may be nutritional and started on on IV thiamine, patient has been requesting narcotics for the pain that she is got but not really having any tenderness on examination.  Patient did NIF and vital capacity on the morning of 12/26/2021 which came back -12 cm H2O and vital capacity 1.5 L.  Patient is not in any respiratory distress, not needing any oxygen at this time.  Patient had additional laboratory studies including HIV and syphilis which came back negative, negative for COVID 19, potassium was noted to be low for which replacement was given, CPK slightly elevated at 239.  Cultures remain negative.   Patient is A&O X 4 and following commands, she is tolerating a regular diet, last BM was 3/11. No hearing problems, wears glasses for reading. Lives with daughter is usually able to walk around the house, uses a RW for community level ambulation, patient usually requires help from her daughter for LE bathing but otherwise independent. Patient with IJ Line which was placed for PLEX, however no longer going to have PLEX so it will be discontinued. Patient is working  with PT and OT and would benefit from inpatient AR to get her as close to her pre-morbid status as possible and to manage his co-morbid conditions as above.     Patient was admitted to acute rehab on 01/03/2022     Past Medical History:   Past Medical History:   Diagnosis Date    Anemia     Asthma     Atrial fibrillation     Chronic obstructive pulmonary disease      Convulsions     Depression     Diabetes mellitus     diet controlled     Hyperlipidemia     Hypertension     TIA (transient ischemic attack)        Allergies:   Allergies   Allergen Reactions    Singulair [Montelukast] Itching    Immune Globulin (Human) Itching and Facial Swelling     Pt complain of itchiness and swelling of lips and tongue. Saturation fine, bp elevated.    Myoview [Technetium-102m] Itching    Contrast [Iodinated Contrast Media]     Latex     Nitroglycerin     Penicillins     Tetracyclines & Related            Family History:   Family History   Problem Relation Age of Onset    Stroke Maternal Grandmother        Functional history and social history:   Pre-morbid status    Bed mobility Independent     Transfers Independent     Locomotion Independent     Upper body dressing Independent     Lower body dressing Independent     Bathing Independent     Toileting Independent     Communication Not impaired     Swallow Not impaired     Cognition Not impaired       Admission status  Current Functional Status  Weight-bearing status:  None     Mobility 01/03/22  Functional Mobility:  Supine to Sit: Stand by Assist  Scooting to EOB: Stand by Assist  Sit to Stand: Minimal Assist;Increased Time;Increased Effort;with instruction for hand placement to increase safety;bed elevated  Stand to Sit: Moderate Assist (2/2 uncontrolled descent)  Transfers  Bed to Chair: Minimal Assist  Device Used for Functional Transfer: front-wheeled walker  PMP - Progressive Mobility Protocol   PMP Activity: Step 6 - Walks in Room      Ambulation:  Ambulation: Minimal Assist;with front-wheeled walker  Pattern: shuffle;R foot decreased clearance;L foot decreased clearance;L genu recurvatum;decreased cadence;decreased step length;R genu recurvatum        Patient Participation: good  Patient Endurance: fair  Activities of Daily Living 01/03/22   Balance  Static Sitting: good  Dynamic Sitting: good  Static Standing: fair+ with BUE support,  fair/fair- with fatigue  Dynamic Standing: fair with RW      Self Care and Home Management  Eating: independent  Grooming: initially CGA at sink for standing balance, required modification to complete from seated 2/2 decreased standing balance   Bathing: modA from seated   UE Dressing: SBA  LE Dressing: SBA adjusting socks from seated EOB  Toileting: maxA     Therapeutic Exercises/Activities  Incorporated into ADLs     Participation: good   Endurance: good  Cognition: Alert/oriented and Follows commands  Cognitive deficits: None noted at this time  Communication deficits: None noted at this time  Swallowing deficits: No  Other impairments: None noted at this time  Review of systems:  Psychiatric: Denies depression, confusion  Constitutional: Denies fevers, chills  Skin: Denies rash, hives  Respiratory: Denies cough, dyspnea  Cardiovascular: Denies chest pain, palpitations  GI: Denies nausea, vomiting, abdominal pain, diarrhea, constipation  GU: Denies dysuria, frequency  Endocrine: Denies polyphagia, polyuria, polydipsia  Musculoskeletal: Denies swelling, redness, joint pain   Hematology: Denies frequent illness  Eyes: Denies blurred vision, decreased vision  ENMT: Denies rhinitis, pharyngitis  Neurologic: Denies headache, dizziness, numbness, +weakness    Medications:   Current Facility-Administered Medications   Medication Dose Route Frequency    apixaban  5 mg Oral Q12H SCH    budesonide-formoterol  2 puff Inhalation BID    lidocaine  1 patch Transdermal Q24H    [START ON 01/05/2022] metoprolol succinate  100 mg Oral QHS    metoprolol succinate XL  25 mg Oral QHS    nortriptyline  10 mg Oral QHS    nystatin  500,000 Units Oral QID    pantoprazole  40 mg Oral QAM AC    NON-FORMULARY PAT OWN MED order form  120 mg Oral Daily    potassium chloride  40 mEq Oral Daily    rOPINIRole  1 mg Oral QHS    senna-docusate  2 tablet Oral QHS    terazosin  2 mg Oral QHS    thiamine  100 mg Oral Daily    vitamin B-6  50 mg Oral  Daily    vitamin D  25 mcg Oral Daily    vitamins/minerals  1 tablet Oral Daily     Physical Examination:   BP 112/73   Pulse (!) 108   Temp 97.5 F (36.4 C) (Oral)   Resp 16   Ht 1.651 m (5\' 5" )   Wt 60.8 kg (134 lb 1.6 oz)   LMP  (LMP Unknown)   SpO2 100%   BMI 22.32 kg/m   Estimated body mass index is 22.32 kg/m as calculated from the following:    Height as of this encounter: 1.651 m (5\' 5" ).    Weight as of this encounter: 60.8 kg (134 lb 1.6 oz).    Gen: Alert, NAD  Psych: Pleasant and cooperative  Skin: Pink, warm, no rashes  HEENT: Atraumatic, normocephalic, EOMI  Neck: Supple, full ROM  Chest: symmetric bilateral chest rise and fall, atraumatic, non-tender  CV: Regular rate and rhythm, no murmurs  Resp: breath sounds equal throughout; non-labored breathing; no wheezes  Abd: non-distended, soft, non-tender, normal abdominal sounds  Extr: Pink, warm,  Neuro:  No aphasia, fluent speech   intact attention, repetition intact, named 3/3 objects,   Cranial Nerves 2-12 intact    Manual muscle strength testing:  Muscle group Right Left   Shoulder abductors 4 4   Elbow flexors 4 4   Elbow extensors 4 4   Wrist extensors 4 4   Middle finger DIP flexors 4 4   Little finger abductors 4 4   Hip flexors 4 4   Knee extensors 4 4   Ankle dorsiflexors 4 4   Ankle plantarflexors 4 4   Great toe extensors 4 4     Medication Review  A complete drug regimen review was completed: Yes  Were drug issues were found during review: No    INPATIENT REHABILITATION FACILITY - PATIENT ASSESSMENT INSTRUMENT QUALITY INDICATORS     INPATIENT REHABILITATION FACILITY - PATIENT ASSESSMENT INSTRUMENT QUALITY INDICATORS: COGNITIVE PATTERNS    Cognitive Pattern Assessment Used: BIMS   Repetition of Three Words (  First Attempt): 3   Temporal Orientation: Year: Correct   Temporal Orientation: Month: Accurate within 5 days  Temporal Orientation: Day: Correct  Recall: "Sock": Yes, no cue required  Recall: "Blue": Yes, no cue  required  Recall: "Bed": No, could not recall  BIMS Summary Score: 13      INPATIENT REHABILITATION FACILITY - PATIENT ASSESSMENT INSTRUMENT QUALITY INDICATORS: CONFUSION ASSESSMENT METHOD (CAM)    Is there evidence of an acute change in mental status from the patient's baseline?: No   Inattention: Behavior not present   Disorganized thinking: Behavior not present  Altered level of consciousness: Behavior not present      INPATIENT REHABILITATION FACILITY - PATIENT ASSESSMENT INSTRUMENT QUALITY INDICATORS: PHQ-2 TO 9    Will the patient answer the depression risk questions?: Yes   Little interest or pleasure in doing things: Not at all   Feeling down, depressed, or hopeless: Not at all   [facility] Score: 0       Labs:   Recent Labs   Lab 01/03/22  0312 01/01/22  0353 12/31/21  0356 12/30/21  1806   WBC 8.27 5.27 7.67 8.82   Hgb 8.6* 8.1* 8.1* 7.9*   Hematocrit 26.6* 26.2* 26.2* 25.2*   Platelets 418* 369* 370* 389*      Recent Labs   Lab 01/03/22  0312 01/01/22  0353 12/31/21  0356 12/30/21  1806   Sodium 132* 133* 134* 134*   Potassium 3.8 4.3 3.8 3.9   Chloride 101 100 101 101   CO2 20 18 21 23    BUN 12.0 9.0 6.0* 6.0*   Creatinine 0.6 0.6 0.6 0.6   Calcium 9.1 9.2 9.3 9.1   Albumin  --   --  2.9*  --    Protein, Total  --   --  6.5  --    Bilirubin, Total  --   --  0.3  --    Alkaline Phosphatase  --   --  60  --    ALT  --   --  32  --    AST (SGOT)  --   --  20  --    Glucose 96 111* 86 100          Assessment:   Patient Active Problem List   Diagnosis    CVA (cerebral vascular accident)    Chest pain    Chest pain with high risk of acute coronary syndrome    Paroxysmal atrial fibrillation    Hypertension    Hyperlipidemia    Seizure disorder    History of gastrointestinal hemorrhage    Depression    Anemia    Asthma    Non-traumatic subcutaneous emphysema    Chest pain with moderate risk for cardiac etiology    Left-sided weakness    History of asthma    History of transient ischemic attack (TIA)    Type 2 diabetes  mellitus    Syncope and collapse    Weakness of both legs    Hypokalemia    Thoracic back pain    Gastroparesis    Guillain Barr syndrome    Myositis       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   Occupational Therapy focused on improving strength, endurance, transfers, activities of daily living, and cognition  for 60-120 mins/day, 5-6 days/week, for 14 days.     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%   Metoprolol,   losartan held due to hypotension    # COPD  Symbicort     # bipolar disorder  Latuda, nortriptyline     # Pain Management   Daily monitoring   gabapentin  Acetaminophen, dilaudid PRN     #Bowel Management  constipation - Senna, Colace, Bisacodyl suppository PRN; hold for loose stools    #Bladder Management  Check PVRs    #Skin Management  Frequent skin assessment  Moisture barrier PRN    #FEN/GI  cardiac diet    #DVT PPx  eliquis    #Dispo  Pending rehab progress      [x]  Requires an intensive inpatient rehabilitation program with multidisciplinary therapies, rehab nursing, and close physician management.    [x]  Is at risk for the following complications:  Injurious falls, Contracture, Heterotopic ossification, Pneumonia, Urinary tract infection, Venous thromboembolic disease, and Fecal impaction        Individualized Plan of Care:    - REHAB: Begin comprehensive and intensive inpatient rehab program, including:  Physical therapy 60-120 min daily, 5-6 times per week  Occupational therapy  60-120 min daily, 5-6 times per  week  Case management  Rehabilitation nursing    Will work in an interdisciplinary manner to address the following impairments and issues:   Mobility, ADLs, Impaired strength, Impaired ROM, Impaired endurance, Adherence to precautions, Caregiver training, Medication management, Adjustment to disability, Impaired coordination, and Impaired balance    Requires 24h rehabilitation nursing to address:  Bladder care, Bowel care, Hydration needs, Medication teaching, Nutrition, Positioning, and Safety    Anticipate a discharge to:  Home with assistance    Estimated length of stay: 14d      Goals for discharge:  Bed mobility Supervision   Transfers Supervision   Locomotion Supervision   Upper body dressing Supervision   Lower body dressing Supervision   Bathing Supervision   Toileting Supervision   Communication Use compensatory strategies to express needs and wants     Swallow Tolerate least restrictive oral diet without signs or symptoms of aspiration     Cognition Use compensatory strategies appropriately to compensate       Rehab potential: fair    Prognosis: fair    Potential limitations:Challenging home environment      Review of Pre-admission assessment  [x]  I have reviewed the nurse liaison's pre-admission assessment.   I do not note any significant changes at this time and agree with patient's appropriateness for intensive inpatient rehab program.      Signed by: Cheryll Dessert, MD     [facility]

## 2022-01-03 NOTE — Plan of Care (Signed)
NURSING PROGRESS NOTE    Patient Name: [name] (66 y.o. female)  Admission Date: 12/25/2021 [facility] Day 9)    Shift Note: Patient is A & O x4, FC, MAE with strength of 4/5 BUE and 2/5 BLE, clear speech.  PRN benadryl administered - pt stated tongue felt thick and itchy after morning meds/ Zofran administered x 1 for nausea.  Foley care completed, and removed at 1310 and set to void around 1910/ unable to void. Bladder scanned with a volume of 371- foley to be put back in.  RA, clear/ diminished lung sounds.  Pt noted with tachycardia.  Consistent carbs diet, thin liquids, whole pills.  Patient pending to be transferred to [facility], report given to [facility]- [phone].  Bleeding, falls and all safety precautions in place. Purposeful hourly rounding done.     Recent Labs   Lab 01/03/22  0312   Sodium 132*   Potassium 3.8   Chloride 101   CO2 20   BUN 12.0   Creatinine 0.6   EGFR >60.0   Glucose 96   Calcium 9.1       Recent Labs   Lab 01/03/22  0312   WBC 8.27   Hgb 8.6*   Hematocrit 26.6*   Platelets 418*         Patient Lines/Drains/Airways Status       Active Lines, Drains and Airways       Name Placement date Placement time Site Days    Peripheral IV 12/25/21 20 G Standard Right Antecubital 12/25/21  0600  Antecubital  9    Urethral Catheter Non-latex 12 Fr. 12/31/21  1530  Non-latex  2                    MEWS Score: 3    Last BM: 03/12  Pending Orders: NA  Discharge Plan: TBD    Safety Checklist   Fall Precautions Y   Avasys N   Seizure Precautions Y   Aspiration Precautions Y   Belongings Checked Y     Service Essentials  Medication Teaching Y   Purposeful Hourly Rounding Y       Interpreter Services:  Does the patient require an Interpreter? N    If yes, what form of interpreter services was used? NA     If family was utilized, is interpreter waiver form signed and in the chart? NA       Problem: Moderate/High Fall Risk Score >5  Goal: Patient will remain free of falls  Outcome: Progressing  Flowsheets  (Taken 01/03/2022 0737)  High (Greater than 13):   HIGH-Utilize chair pad alarm for patient while in the chair   HIGH-Consider use of low bed   HIGH-Visual cue at entrance to patient's room   HIGH-Bed alarm on at all times while patient in bed   HIGH-Initiate use of floor mats as appropriate   HIGH-Apply yellow "Fall Risk" arm band     Problem: Compromised Tissue integrity  Goal: Damaged tissue is healing and protected  Outcome: Progressing  Flowsheets (Taken 01/02/2022 0744)  Damaged tissue is healing and protected:   Monitor/assess Braden scale every shift   Provide wound care per wound care algorithm   Reposition patient every 2 hours and as needed unless able to reposition self   Relieve pressure to bony prominences for patients at moderate and high risk   Avoid shearing injuries   Keep intact skin clean and dry   Use incontinence wipes for  cleaning urine, stool and caustic drainage. Foley care as needed   Monitor external devices/tubes for correct placement to prevent pressure, friction and shearing  Goal: Nutritional status is improving  Outcome: Progressing  Flowsheets (Taken 01/02/2022 0744)  Nutritional status is improving:   Allow adequate time for meals   Encourage patient to take dietary supplement(s) as ordered     Problem: Bladder/Voiding  Goal: Perineal skin integrity is maintained or improved  Outcome: Progressing  Flowsheets (Taken 01/03/2022 0737)  Perineal skin integrity is maintained or improved:   Keep intact skin clean and dry   Apply urinary containment device as appropriate and/or per order   Use protective skin barriers to decrease potential skin breakdown  Goal: Free from infection  Outcome: Progressing  Flowsheets (Taken 01/01/2022 1637)  Free from infection: Monitor/assess for signs and symptoms of infection     Problem: Neurological Deficit  Goal: Neurological status is stable or improving  Outcome: Progressing  Flowsheets (Taken 01/01/2022 0658 by Tonye Becket, RN)  Neurological status is  stable or improving:   Monitor/assess/document neurological assessment (Stroke: every 4 hours)   Perform CAM Assessment     Problem: Pain interferes with ability to perform ADL  Goal: Pain at adequate level as identified by patient  Outcome: Progressing  Flowsheets (Taken 01/01/2022 1637)  Pain at adequate level as identified by patient:   Assess pain on admission, during daily assessment and/or before any "as needed" intervention(s)   Reassess pain within 30-60 minutes of any procedure/intervention, per Pain Assessment, Intervention, Reassessment (AIR) Cycle

## 2022-01-03 NOTE — Progress Notes (Signed)
01/03/22 0920   Respiratory Parameters - Non Ventilated   Status Completed   Vital capacity 1.32 L   $ Vital Capacity Performed? Yes   Negative inspiratory force -16 cm H2o   $ NIF DONE Yes   Equipment labeled Yes   Adverse Reactions None   Performing Departments   PLAB VC performing department formula [id number]   Resp param - No vent performing department formula [id number]

## 2022-01-03 NOTE — Plan of Care (Signed)
Patient is alert and oriented, on room air, received dilaudid x 1 for back pain, Zofran x 1 for nausea. She was awake throughout the night, watching movies. Foley care was provided. No obvious distress noted.    Problem: Moderate/High Fall Risk Score >5  Goal: Patient will remain free of falls  Outcome: Progressing  Flowsheets (Taken 01/03/2022 0502)  High (Greater than 13):   HIGH-Consider use of low bed   HIGH-Apply yellow "Fall Risk" arm band  VH Moderate Risk (6-13): Use of floor mat  VH High Risk (Greater than 13):   Use assistive devices   Keep door open for better visibility   A CHAIR PAD ALARM WILL BE USED WHEN PATIENT IS UP SITTING IN A CHAIR   RED "HIGH FALL RISK" SIGNAGE   Request PT/OT therapy consult order from physician for patients with gait/mobility impairment     Problem: Compromised Tissue integrity  Goal: Damaged tissue is healing and protected  Outcome: Progressing  Flowsheets (Taken 01/02/2022 0744 by Deedra Ehrich, RN)  Damaged tissue is healing and protected:   Monitor/assess Braden scale every shift   Provide wound care per wound care algorithm   Reposition patient every 2 hours and as needed unless able to reposition self   Relieve pressure to bony prominences for patients at moderate and high risk   Avoid shearing injuries   Keep intact skin clean and dry   Use incontinence wipes for cleaning urine, stool and caustic drainage. Foley care as needed   Monitor external devices/tubes for correct placement to prevent pressure, friction and shearing  Goal: Nutritional status is improving  Outcome: Progressing  Flowsheets (Taken 01/02/2022 0744 by Deedra Ehrich, RN)  Nutritional status is improving:   Allow adequate time for meals   Encourage patient to take dietary supplement(s) as ordered     Problem: Bladder/Voiding  Goal: Remains continent  Outcome: Progressing  Flowsheets (Taken 01/03/2022 0502)  Remains continent:   Encourage toileting   Monitor intake and output   Initiate bladder training  program   Encourage patient to empty bladder at regular intervals   Utilize bladder scans prior to or post void as appropriate   Ambulate patient to the bathroom with appropriate assistance  Goal: Perineal skin integrity is maintained or improved  Outcome: Progressing  Flowsheets (Taken 12/27/2021 1334 by Henrine Screws, RN)  Perineal skin integrity is maintained or improved:   Keep intact skin clean and dry   Use protective skin barriers to decrease potential skin breakdown   Consult/collaborate with Wound Care Nurse  Goal: Free from infection  Outcome: Progressing  Flowsheets (Taken 01/01/2022 1637 by Deedra Ehrich, RN)  Free from infection: Monitor/assess for signs and symptoms of infection  Goal: Patient will experience proper bladder emptying during admission  Outcome: Progressing  Flowsheets (Taken 12/28/2021 2051 by Jarold Song, RN)  Patient will experience proper bladder emptying during admission:   Monitor intake and output   Encourage patient to empty bladder at regular intervals     Problem: Neurological Deficit  Goal: Neurological status is stable or improving  Outcome: Progressing  Flowsheets (Taken 01/01/2022 0658 by Tonye Becket, RN)  Neurological status is stable or improving:   Monitor/assess/document neurological assessment (Stroke: every 4 hours)   Perform CAM Assessment     Problem: Peripheral Neurovascular Impairment  Goal: Extremity color, movement, sensation are maintained or improved  Outcome: Progressing  Flowsheets (Taken 12/28/2021 0421 by Roanna Epley, RN)  Extremity color, movement, sensation are maintained or improved:  Increase mobility as tolerated/progressive mobility   Assess and monitor application of corrective devices (cast, brace, splint), check skin integrity   Assess extremity for proper alignment     Problem: Compromised Hemodynamic Status  Goal: Vital signs and fluid balance maintained/improved  Outcome: Progressing  Flowsheets (Taken 12/28/2021 0421 by Roanna Epley,  RN)  Vital signs and fluid balance are maintained/improved:   Position patient for maximum circulation/cardiac output   Monitor/assess vitals and hemodynamic parameters with position changes   Monitor and compare daily weight   Monitor intake and output. Notify LIP if urine output is less than 30 mL/hour.   Monitor/assess lab values and report abnormal values     Problem: Nutrition  Goal: Nutritional intake is adequate  Outcome: Progressing  Flowsheets (Taken 01/01/2022 1637 by Deedra Ehrich, RN)  Nutritional intake is adequate: Allow adequate time for meals     Problem: Pain interferes with ability to perform ADL  Goal: Pain at adequate level as identified by patient  Outcome: Progressing  Flowsheets (Taken 01/01/2022 1637 by Deedra Ehrich, RN)  Pain at adequate level as identified by patient:   Assess pain on admission, during daily assessment and/or before any "as needed" intervention(s)   Reassess pain within 30-60 minutes of any procedure/intervention, per Pain Assessment, Intervention, Reassessment (AIR) Cycle     Problem: Side Effects from Pain Analgesia  Goal: Patient will experience minimal side effects of analgesic therapy  Outcome: Progressing  Flowsheets (Taken 12/28/2021 1502 by Henrine Screws, RN)  Patient will experience minimal side effects of analgesic therapy: Monitor/assess patient's respiratory status (RR depth, effort, breath sounds)

## 2022-01-03 NOTE — Progress Notes (Signed)
[facility] Note:     Peer to peer completed and patient received authorization for acute inpatient rehab admission at: [facility]     Authorization received from: [facility] on behalf of Gevena Barre   Authorization number: [id number]  Dates of service: 3/13 to 3/19  Next review date: 3/20  Concurrent review nurse following: TBD  Phone: [phone]  Fax: [phone]     Rehab Nurse Liaison notified that authorization was received.     Willette Cluster, RN, BSN, CCM  Rehab Admissions Insurance Authorization Liaison   [facility]   [facility] and [facility]

## 2022-01-03 NOTE — Progress Notes (Signed)
[facility] Note:   Referral received/Following patient for acute inpatient rehab needs.    The patient will admit [facility] [address] [place], [place] [address], [address] , room 501-2 , report to [phone]  Please let me know pick up time when you have it. Thanks  Note To DISCHARGING PHYSICIAN: Place a "discharge-readmit" order when a patient transitions over to [facility]/ [facility]. This feature can be accessed through "Navigator", then d/c re-admit and then orders.   CM/SW notified of decision.   Clydie Braun [place] MSN, RN-BC, CRRN, [employer]

## 2022-01-03 NOTE — Plan of Care (Signed)
Newly admitted.  A, O x 4.  Reports pain at "6" in feet and legs. Dilaudid prn to manage.  Foley drains qs amber urine: pt had failed trial without foley earlier in the day (3/13) but unable to spontaneously void and foley replaced.    Excoriation at peri area, bottom, and gluteal fold but no open areas. Mepilex applied.     Problem: Bladder Management  Goal: LTG: Pt will be 100% continent and independent with bladder elim at [place]  Outcome: Progressing  Note: Foley indwelling on admission. Pt had trial earlier in the day 3/13 with foley removal but unable to spontaneously void and foley replaced prior to transfer to [place].     Problem: Cardiac Function  Goal: LTG: Pt will be independent with BP and pulse management and medication at [place]  Outcome: Progressing  Note: On medication to manage pulse and BP. Tachy 110-118 at times.     Problem: Pain Management  Goal: LTG: Pt will achieve good pain control with current medication regimen as evidenced by ability to participate in 100% of therapy sessions.  Outcome: Progressing  Note: Reports pain at "6" on admission. On lidoderm patch and prn dilaudid to manage.     Problem: Safety Risk  Goal: LTG: Pt will recognize limitations and use call bell 100% of the time prior to transfers in order to prevent fsalls  Outcome: Progressing  Note: Pt is at the yellow level for falls risk. She is able to return demo of call bell and reports she will comply with fall protocol.     Problem: Skin Wound Management  Goal: LTG: Excoriation at buttocks will resolve by [place]  Outcome: Progressing  Note: Excoriation at buttocks. No open areas. Mepilex applied. Specialty bed to promote skin integrity ordered but not yet arrived on the unit.

## 2022-01-03 NOTE — Discharge Summary (Addendum)
Discharge Summary    Date:01/03/2022   Patient Name: [name]  Attending Physician: Frederich Cha, DO    Date of Admission:   12/25/2021    Date of Discharge:   01/03/2022      Admitting Diagnosis:   #Difficulty walking and bilateral leg and hand weakness - likely from statin induced myositis vs inflammatory myositis vs immune mediated myostitis.    Discharge Dx:     Principal Diagnosis (Diagnosis after study, that is chiefly responsible for admission to inpatient status): Weakness of both legs  #Difficulty walking and bilateral leg and hand weakness - likely from statin induced myositis vs inflammatory myositis vs immune mediated myostitis.  #Leg muscle edema seen on MRI   #Vitamin D deficiency - repleted.   #Zinc deficiency - repleted  #B6 deficiency - repleted  #Iron deficiency anemia and AICD - eval for Celiac disease  #Upper abdominal and mid back pain - improved  #Poor appetite/malnourished   #Urinary retention - voiding trial at discharge  #Paroxysmal atrial fibrillation/tachycardia - rate is controlled, patient is on apixaban.    #Cardiomyopathy likely non-ischemic with LVEF 43%  #Hypertension   #COPD   #Type 2 diabetes mellitus - diet controlled  #Moderate malnutrition related to inadequate protein energy intake in the setting of acute illness as evidenced by intake <75% of estimated energy requirements for >1 week,  7.5% weight loss in 3 months,   mild depletion of muscle loss (temporalis, pectoralis major, quadriceps, gastrocnemius).   #Chronic HFrEF (euvolemic now)  #Hypokalemia - replaced  #Anemia - most likely poor nutrition and chronic disease.  IDA.    #Bipolar disorder   #Constipation -  #Mouth itching and swelling sensation likely from contrast allergy or megesterol            Treatment Team:   Treatment Team:   Attending Provider: Frederich Cha, DO     Procedures performed:   Radiology: all results from this admission  CT Abdomen Pelvis WO IV/ WO PO Cont    Result Date: 12/28/2021  Distended urinary  bladder. Otherwise unremarkable noncontrast abdomen pelvis CT. Rocky Crafts, MD 12/28/2021 11:26 AM    CT Head WO Contrast    Result Date: 12/24/2021   No acute intracranial process. Sandie Ano, MD  12/24/2021 8:17 PM    CT Chest WO Contrast    Result Date: 12/28/2021  No evidence of malignancy in the chest. Carlynn Spry, MD 12/28/2021 2:41 PM    CT Angiogram Chest    Result Date: 01/01/2022  1.Limited evaluation of the subsegmental pulmonary arteries. No evidence of pulmonary embolism given this limitation. 2.Dilated ascending aorta measuring up to 4.1 cm. Demetrios Isaacs, MD 01/01/2022 7:57 AM    MRI Brain WO Contrast    Result Date: 12/25/2021  No acute intracranial abnormality. Mild chronic microvascular ischemic changes. Cavum septum pellucidum and cavum vergae. Alric Seton MD, MD  12/25/2021 2:47 AM    MRI Cervical Spine WO Contrast    Result Date: 12/25/2021  No cord compression or intrinsic cord signal abnormality. Multilevel degenerative changes as described above, with varying degrees of neural foraminal narrowing, and no central canal stenosis. Alric Seton MD, MD  12/25/2021 2:53 AM    MRI Thoracic Spine WO Contrast    Result Date: 12/25/2021  No cord compression or intrinsic cord signal abnormality. Minimal degenerative changes as described above. Alric Seton MD, MD  12/25/2021 2:56 AM    MRI Lumbar Spine WO Contrast    Result Date:  12/25/2021  1. 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 Orson Gear, MD  12/25/2021 2:54 AM    Chest AP Portable    Result Date: 12/24/2021   No acute pulmonary or pleural disease. Sandie Ano, MD  12/24/2021 7:14 PM    MRI Femur Right W WO Contrast    Result Date: 12/30/2021  1. Mild edema and atrophy involving the semimembranosus and biceps femoris muscles of the right and left thighs, noted to be bilateral and symmetric in appearance. Mild enhancement on the postcontrast images. Very mild edema in the anterior compartment muscles of the right  and left thighs, with mild enhancement. Mild atrophy of the semimembranosus and biceps femoris muscles bilaterally. These findings are nonspecific, and may be seen in the setting of muscle denervation or myositis. 2. No atrophy involving the anterior compartment muscles. 3. Mild edema in the proximal adductor longus and adductor magnus muscles bilaterally. 4. No areas of nonenhancement to suggest muscle necrosis. [name] Birks, MD 12/30/2021 3:22 PM    MRI Femur Left W WO Contrast    Result Date: 12/30/2021  1. Mild edema and atrophy involving the semimembranosus and biceps femoris muscles of the right and left thighs, noted to be bilateral and symmetric in appearance. Mild enhancement on the postcontrast images. Very mild edema in the anterior compartment muscles of the right and left thighs, with mild enhancement. Mild atrophy of the semimembranosus and biceps femoris muscles bilaterally. These findings are nonspecific, and may be seen in the setting of muscle denervation or myositis. 2. No atrophy involving the anterior compartment muscles. 3. Mild edema in the proximal adductor longus and adductor magnus muscles bilaterally. 4. No areas of nonenhancement to suggest muscle necrosis. [name] Birks, MD 12/30/2021 3:22 PM    Triple Lumen Dialysis Cath    Result Date: 12/28/2021  Right IJ 13 French 20 cm trialysis catheter placement. Tip in the deep right atrium (only place where adequate flow was obtained) . PLAN: Catheter ready for use. PROCEDURE SUMMARY: *Venous access in ultrasound guidance. *Nontunneled dialysis catheter placement. TECHNIQUE/FINDINGS: Informed consent for the procedure including risks, benefits and alternatives was obtained. A safety timeout was performed and documented with all members of the procedure team present, including verification of patient identification, correct procedure, procedure site, and laterality. The patient was positioned supine. Preparation: (MIPS): The site was prepared and draped  using all elements of maximal sterile barrier technique including sterile gloves, sterile gown, cap, mask, large sterile sheet, sterile ultrasound probe cover, hand hygiene and cutaneous antisepsis with 2% chlorhexidine. Medical reason for site preparation exception (MIPS): Not applicable. Sedation: The patient was placed under no additional IV sedation which was administered/monitored by not applicable. Sonographic interrogation of the right internal jugular vein demonstrated patency. Local anesthesia was administered at the access site. A small dermatotomy an #11 scalpel was made and venous access was achieved with a 21 gauge single wall needle. Sonographic images were obtained pre- and post- puncture for documentation.  A 0.018 inch guidewire was advanced into the inferior vena cava. The needle was exchanged for a 5 French coaxial dilator system. Using fluoroscopic guidance, the 0.018 inch guidewire was removed and marked at the appropriate length for the intravascular portion of the catheter and a 0.035 inch wire was then advanced into the inferior vena cava. Sequential dilation was performed over the wire and the tiialysis catheter was advanced. The wire was removed. The catheter were evaluated for appropriate flow and were  flushed with normal saline. The catheter was secured to the skin with nonabsorbable suture. A sterile dressing was applied. The procedure was well tolerated.  A fluoroscopic image was obtained to document catheter tip position at the right atrium. All indicated images were saved to PACS. CONTRAST: Contrast agent: None Contrast volume (mL): 0 RADIATION PARAMETERS: Fluoroscopy time (minutes): 0.6  Reference air kerma (mGy): 3.6 Darci Current, MD 12/28/2021 12:42 PM     Reason for Admission:   #Difficulty walking and bilateral leg and hand weakness - likely from statin induced myositis vs inflammatory myositis vs immune mediated myostitis.  Hospital Course:     CC: Weakness of both legs     Interval  History/24 hour events: Patient say every day she is getting better, no shortness of breath, no chest pain, back pain seem to be getting better     HPI: HPI per Admitting Provider   " Marlia Schewe is a 66 y.o. female hx afib on Eliquis, HTN, HLD, DM, COPD, TIAs, COPD, bipolar disorder, [facility] admit 2/7-2/17/23 for rhabdomyolysis (peak CK 4464 - statin d/c'ed) + vomiting ?gastroparesis + starvation ketoacidosis + UTI, [facility] admit 2/18-2/20/23 for rhabdomyolysis (peak CK 2509) + transaminitis + bilateral leg weakness + right paresthesias, [facility] ER visit 3/2 for bilateral leg weakness who presents to [facility] with difficulty standing and walking, bilateral leg/hand weakness, bilateral leg/hand/face paresthesias. She noted bilateral leg weakness with her prior [facility] admission 2/7-2/17 and was readmitted for bilateral leg weakness 2/18-2/20. The past 4 days she has had difficulty standing and walking. The past 4 days she notes new bilateral hand and face paresthesias and bilateral hand weakness. She has some shortness of breath but is speaking in full sentences. She went to [facility] ER 3/2 and was discharged. She saw her PMD who advised she go to an [facility]. At [facility] she got MRI brain and CTL-spine which did not show a cause for her symptoms. [facility] called neurology Dr. Francesco Sor who said to consider IVIG for 5 days, LP, EMG. The LP was done at [facility]. Dr. Francesco Sor advised transfer to [facility]: [facility] Rhonda W. These symptoms are sudden onset, moderate intensity, without alleviating factors. "      Hospital course:   Following admission, patient was seen by neurology consult who performed MRI of the entire spine and brain, without contrast which came back to show no major issues except on the lumbar area L4-L5 disc disease with facet arthrosis and degenerative changes, neurology team recommended LP and CSF studies so far are unrevealing, patient continued to complain of having bandlike pain around the epigastric area & in the back, there was  tenderness on the right side of the thoracic vertebral area in the muscle, Lidoderm patch applied, patient is getting Protonix and Maalox, has not had a bowel movement for that reason given lactulose and stool softeners.  Patient was [facility] from [facility] for consideration of IVIG with a suspicion of GBS, EMG currently pending.      Due to her history of not eating well and has been losing weight over the past few weeks, neurology team felt may be nutritional and started on on IV thiamine, patient has been requesting narcotics for the pain that she is got but not really having any tenderness on examination.  Patient did NIF and vital capacity on the morning of 12/26/2021 which came back -12 cm H2O and vital capacity 1.5 L.  Patient is not in any respiratory distress, not needing any oxygen at this time.  Patient  had additional laboratory studies including HIV and syphilis which came back negative, negative for COVID 19, potassium was noted to be low for which replacement was given, CPK slightly elevated at 239.  Cultures remain negative.      Patient was seen by PT and OT team who recommended going to acute rehab.  Patient was having constipation, stool softeners and laxatives given.      3/8: Pt feels better.  Diffuse pain improved.  LE weakness improved.    3/9: Patient seen and examined she was noted to have abdominal distention.  Patient reports poor appetite over the past 2 weeks.  She denies any history of bariatric surgery or malabsorption disorder.  3/10: Pt c/o shortness of breath and palpitations, but no chest pain.  Pt has remained tachycardic throughout her hospitalization.  Pt urinates, but she still retains urine requiring straight catheterization.  Her legs feel weak again today.      3/12: Her legs feel stronger, but she still has mouth itching and thickened feeling.  She has h/o tachycardia, but worse with OT/PT today.  HRs up to the 140s per OT.  Pt reports starting Ozempic 6 weeks ago.            Difficulty walking and bilateral leg and hand weakness - likely from statin induced myositis vs inflammatory myositis vs immune mediated myostitis. patient to has multiple vague complaints who was transferred from outside hospital for consideration of EMG for [name] consideration, and possible IVIG, currently seen by neurology team here and based on her history started the patient on thiamine IV, at this time we will continue to perform EMG, possible IVIG, LP was done and came back unremarkable, got seen by PT and OT team who recommended going to acute rehab, fall precaution, continue monitoring.  Patient had NIF and VC done on 3/5 which showed low volume.  Patient continued proximal muscle weakness, areflexia, highly suspicious for GBS and neurology team started the patient on IVIG on 12/27/2021, after the start of IVIG within the first 10 minutes patient developed numbness on the upper lip on the right side and tongue numbness, was that it was discontinued and was placed as allergy, following that IgG antibodies were ordered, IVIG antibodies ordered, stroke team recommended placing dialysis catheter and doing plasma exchange, pathology team was contacted, patient says every day she is feeling better, currently awaiting the start of plasma exchange.  Patient also had a EMG done on 12/27/2021, based on that CT scan of the abdomen and pelvis was done which showed no hematoma.   MRI L and R thigh or femur W WO   Mild edema and atrophy involving the semimembranosus and biceps  femoris muscles of the right and left thighs, noted to be bilateral and  symmetric in appearance. These findings are nonspecific, and may be seen in the setting of muscle denervation or myositis.  Mild edema in the proximal adductor longus and adductor magnus muscles bilaterally.   No areas of nonenhancement to suggest muscle necrosis.  LDH 450, CPK 84, LFTs, aldolase, viral hepatitis panel negative  Discontinue HD catheter    Appreciate input from neurology team.   Schedule outpatient muscle biopsy   Vitamin D deficiency - repleted.   Zinc deficiency - repleted  B6 deficiency - repleted  Iron deficiency anemia and AICD - eval for Celiac disease  Upper abdominal and mid back pain - patient complaining of having some pain in the epigastric area banding leg to the mid  back, she is placed on PPI, Tums, muscle relaxants, as needed pain medications, may consider placing the patient on scheduled Tylenol, will continue monitoring.  Pain is getting better by the day .   Recommend outpatient GI evaluation for malabsorption disorder given vitamin and mineral deficiencies.   Moderate malnutrition related to inadequate protein energy intake in the setting of acute illness as evidenced by intake <75% of estimated energy requirements for >1 week,  7.5% weight loss in 3 months,   mild depletion of muscle loss (temporalis, pectoralis major, quadriceps, gastrocnemius). Poor appetite/malnourished - nutrition consult.  CT Chest/Abd/Pelvis - no malignancy noted.    Urinary retention - tamsulosin 0.4mg  po daily.  Voiding trial today.  May need a foley catheter if she still retains urine.    Paroxysmal atrial fibrillation/tachycardia - rate is controlled, patient is on apixaban.  Metoprolol succcinate increased to 100 mg po daily .  CTA chest negative for PE, lung masses, consolidations, or lymphadenopathy.    Cardiomyopathy LVEF 43%, Chronic HFrEF  ECHO Summary    * Left ventricular systolic function is mildly decreased with an ejection  fraction by Biplane Method of Discs of  43 %.    * Mild global hypokinesis.    * There is mild aortic regurgitation.    * There is a venous catheter visualized in the right atrium.  Will need an ischemic work up after her LE weakness improves  Appreciate [facility] cardiology input  Hypertension - blood pressure is controlled, continue monitoring.   COPD - noted, patient is getting Symbicort, in no acute respiratory distress,  on oxygen.   Type 2 diabetes mellitus - borderline hemoglobin A1c in the past 2020 at 6.0, currently no treatment, hemoglobin A1c 5.3%.  Stop Ozempic given malnutrition and weight loss.    Hypokalemia - replaced, monitor closely, magnesium level is also lower, replaced magnesium, now  started BID potassium ordered.   Anemia - most likely poor nutrition and chronic disease, will check iron profile, consider iron infusion.  Multivitamins added.   Bipolar disorder - patient seem to have some issues with behavior, patient is on nortriptyline and Latuda, no acute exacerbation, continue monitoring.  Would consider changing Latuda given possibly tardive dyskinesia cause tongue twisting.  Follow up with psychiatry, Dr. Earnest Rosier.  Consider changing medications to help with dyskinesia.    Constipation - patient said last bowel meant a few days ago, stool softeners and laxatives given, had multiple bowel movements on the evening of 12/27/2021, continue monitoring  Mouth itching and swelling sensation likely from contrast allergy - benadryl prn.  Stop zinc.  Caution with losartan started by cardiology.       GI prophylaxis: Protonix, Maalox  DVT ppx Apixaban on hold to remove HD catheter.       Spoke to patient's daughter, Morrell Riddle over the phone and updated her about patient's condition       Condition at Discharge:   Patient is hemodynamically and neurologically stable for discharge.     Today:     BP 94/64   Pulse (!) 118   Temp 98.2 F (36.8 C) (Oral)   Resp 18   Ht 1.651 m (5\' 5" )   Wt 72.6 kg (160 lb)   LMP  (LMP Unknown)   SpO2 100%   BMI 26.63 kg/m   Ranges for the last 24 hours:  Temp:  [98.1 F (36.7 C)-99.1 F (37.3 C)] 98.2 F (36.8 C)  Heart Rate:  [114-125] 118  Resp Rate:  [17-20]  18  BP: (94-133)/(64-76) 94/64    Last set of labs   Recent Labs   Lab 01/03/22  0312   WBC 8.27   Hgb 8.6*   Hematocrit 26.6*   Platelets 418*     Recent Labs   Lab 01/03/22  0312   Sodium 132*   Potassium 3.8   Chloride 101   CO2  20   BUN 12.0   Creatinine 0.6   EGFR >60.0   Glucose 96   Calcium 9.1     Recent Labs   Lab 12/31/21  0356   Bilirubin, Total 0.3   Bilirubin Direct 0.2   Protein, Total 6.5   Albumin 2.9*   ALT 32   AST (SGOT) 20               Invalid input(s): FREET4      Recent Labs   Lab 12/30/21  1806 12/28/21  0520   Creatine Kinase (CK) 84 154     Microbiology Results (last 15 days)       Procedure Component Value Units Date/Time    COVID-19 (SARS-CoV-2) only (Liat Rapid) - Required by Extended care facility discharge within 24 hours [[id number]] Collected: 12/31/21 1434    Order Status: Completed Specimen: Nasopharyngeal Updated: 12/31/21 1524     Purpose of COVID testing Screening     SARS-CoV-2 Specimen Source Nasal Swab     SARS CoV 2 Overall Result Not Detected     Comment: __________________________________________________  -A result of "Detected" indicates POSITIVE for the    presence of SARS CoV-2 RNA  -A result of "Not Detected" indicates NEGATIVE for the    presence of SARS CoV-2 RNA  __________________________________________________________  Test performed using the Roche cobas Liat SARS-CoV-2 assay. This assay is  only for use under the Food and Drug Administration's Emergency Use  Authorization. This is a real-time RT-PCR assay for the qualitative  detection of SARS-CoV-2 RNA. Viral nucleic acids may persist in vivo,  independent of viability. Detection of viral nucleic acid does not imply the  presence of infectious virus, or that virus nucleic acid is the cause of  clinical symptoms. Negative results do not preclude SARS-CoV-2 infection and  should not be used as the sole basis for diagnosis, treatment or other  patient management decisions. Negative results must be combined with  clinical observations, patient history, and/or epidemiological information.  Invalid results may be due to inhibiting substances in the specimen and  recollection should occur. Please see Fact Sheets for patients and  providers  located:  [email or web address]         Narrative:      o Collect and clearly label specimen type:  o PREFERRED-Upper respiratory specimen: One Nasal Swab in  Transport Media.  o Hand deliver to laboratory ASAP  Testing as required by extended care facility?->Yes  Screening    COVID-19 (SARS-CoV-2) only ([facility]iat Rapid) - Required by Extended care facility discharge within 24 hours [[id number]]  (Abnormal) Collected: 12/31/21 1241    Order Status: Completed Specimen: Nasopharyngeal Updated: 12/31/21 1426     Purpose of COVID testing Screening     SARS-CoV-2 Specimen Source Nasal Swab     SARS CoV 2 Overall Result Invalid     Comment: TEST RERUN TWICE AND UNABLE TO OBTAIN RESULT .SPOKE TO N#[id number]  12/31/2021  14:26  __________________________________________________  -A result of "Detected" indicates POSITIVE for the    presence of SARS CoV-2 RNA  -A result of "Not Detected" indicates NEGATIVE  for the    presence of SARS CoV-2 RNA  __________________________________________________________  Test performed using the Roche cobas Liat SARS-CoV-2 assay. This assay is  only for use under the Food and Drug Administration's Emergency Use  Authorization. This is a real-time RT-PCR assay for the qualitative  detection of SARS-CoV-2 RNA. Viral nucleic acids may persist in vivo,  independent of viability. Detection of viral nucleic acid does not imply the  presence of infectious virus, or that virus nucleic acid is the cause of  clinical symptoms. Negative results do not preclude SARS-CoV-2 infection and  should not be used as the sole basis for diagnosis, treatment or other  patient management decisions. Negative results must be combined with  clinical observations, patient history, and/or epidemiological information.  Invalid results may be due to inhibiting substances in the specimen and  recollection should occur. Please see Fact Sheets for patients and  providers  located:  [email or web address]         Narrative:      o Collect and clearly label specimen type:  o PREFERRED-Upper respiratory specimen: One Nasal Swab in  Transport Media.  o Hand deliver to laboratory ASAP  Testing as required by extended care facility?->Yes  Screening    Culture + Gram Azzie Glatter, CSF [[id number]] Collected: 12/24/21 2232    Order Status: Completed Specimen: Cerebrospinal Fluid from CSF (Lumbar Puncture Spinal Fluid) Updated: 01/01/22 1458    Narrative:      ORDER#: [id number]                                    ORDERED BY: PATITUCCI, LAUR  SOURCE: CSF (Lumbar Puncture Spinal Fluid) lumbar spiCOLLECTED:  12/24/21 22:32  ANTIBIOTICS AT COLL.:                                RECEIVED :  12/27/21 18:51  Stain, Gram                                FINAL       12/27/21 20:56  12/27/21   No WBCs seen             No organisms seen             Stain performed on Cytospin (concentrated) specimen  Culture and Gram Stain, Aerobic, CSF       FINAL       01/01/22 14:58  01/01/22   No Growth      CSF Meningitis/Encephalitis Pathogen Panel PCR [[id number]] Collected: 12/24/21 2232    Order Status: Completed Specimen: CSF (Lumbar Puncture Spinal Fluid) Updated: 12/25/21 1110     CSF Eschericia coli K1 by PCR Not Detected     CSF Haemophilus influenza by PCR Not Detected     CSF Listeria monocytogenes by PCR Not Detected     CSF Neisseria meningitidis (encapsulated) by PCR Not Detected     CSF Streptococcus agalactiae by PCR Not Detected     CSF Streptococcus pneumoniae by PCR Not Detected     CSF Cytomegalovirus by PCR Not Detected     CSF Enterovirus by PCR Not Detected     CSF Herpes simplex virus 1 by PCR Not Detected     CSF Herpes simplex virus 2 by PCR Not Detected  CSF Human herpesvirus 6 by PCR Not Detected     CSF Human parechovirus by PCR Not Detected     CSF Varicella zoster virus by PCR Not Detected     CSF Cryptococcus neoformans/gattii by PCR Not Detected      Comment: Test performed with the [employer] Meningitis/Encephalitis (ME)  PCR Panel. A negative result does not rule out central nervous  system infection and results from this panel are not intended  to be used as the sole basis for diagnosis, treatment, or  other patient management decisions. Consider pathogen specific  testing if FilmArray ME is negative but clinical suspicion is  high for a particular infection. False negative results could  be due to the presence of strains with genetic variability in  the target regions,concentration of pathogen nucleic acid below  the limit of detection, or improper specimen handling.  This test has not been specifically evaluated with CSF from  immunocompromised patients and this test may be affected by  concurrent antimicrobial therapy. Positive results may be due  to detection of non-viable organism. Clinical correlation is  required. False positives may result from contamination during  specimen collection or processing. This test should not be  used for monitoring treatment of infection.         Narrative:      Indication for Meningitis/Encephalitis Panel PCR  Testing:->Very high clinical concern for infectious  encephalitis    COVID-19 (SARS-CoV-2) only ([facility]) asymptomatic admission [[id number]] Collected: 12/24/21 2057    Order Status: Completed Specimen: Nasopharyngeal Updated: 12/24/21 2136     Purpose of COVID testing Screening     SARS-CoV-2 Specimen Source Nasal Swab     SARS CoV 2 Overall Result Not Detected     Comment: __________________________________________________  -A result of "Detected" indicates POSITIVE for the    presence of SARS CoV-2 RNA  -A result of "Not Detected" indicates NEGATIVE for the    presence of SARS CoV-2 RNA  __________________________________________________________  Test performed using the Roche cobas Liat SARS-CoV-2 assay. This assay is  only for use under the Food and Drug Administration's Emergency Use  Authorization. This is  a real-time RT-PCR assay for the qualitative  detection of SARS-CoV-2 RNA. Viral nucleic acids may persist in vivo,  independent of viability. Detection of viral nucleic acid does not imply the  presence of infectious virus, or that virus nucleic acid is the cause of  clinical symptoms. Negative results do not preclude SARS-CoV-2 infection and  should not be used as the sole basis for diagnosis, treatment or other  patient management decisions. Negative results must be combined with  clinical observations, patient history, and/or epidemiological information.  Invalid results may be due to inhibiting substances in the specimen and  recollection should occur. Please see Fact Sheets for patients and providers  located:  WirelessDSLBlog.no         Narrative:      o Collect and clearly label specimen type:  o PREFERRED-Upper respiratory specimen: One Nasal Swab in  Transport Media.  o Hand deliver to laboratory ASAP  Indication for testing->Extended care facility admission to  semi private room  Screening            Micro / Labs / Path pending:     Unresulted Labs       Procedure . . . Date/Time    Tissue Transglutaminase Antibody Panel [[id number]] Collected: 12/31/21 0850     Updated: 12/31/21 0857    Aldolase [[id number]] Collected: 12/30/21  1806    Specimen: Blood Updated: 12/30/21 1834    Ganglioside Ab Panel, S [[id number]] Collected: 12/28/21 1710     Updated: 12/28/21 1732    Encephalopathy Autoimmune Evaluation [[id number]] Collected: 12/28/21 1710     Updated: 12/28/21 1732    Myasthenia Gravis Panel w/Reflex to MuSK Ab [[id number]] Collected: 12/28/21 1710    Specimen: Blood Updated: 12/28/21 1732    Paraneoplastic Autoantibody Shanon Ace [[id number]] Collected: 12/28/21 1710     Updated: 12/28/21 1732            Discharge Instructions For Providers     Instructions for after your discharge:  Follow up all hospital labs with Neurology   Schedule outpatient muscle biopsy with Neurosurgery in  2-4 weeks  Stop Ozempic due to weight loss and malnutrition   Recommend outpatient GI evaluation for malabsorption disorder given vitamin and mineral deficiencies.   Tamsulosin 0.4mg  po daily.   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  Consider alternative antipsychotic therapy.   Ferrous sulfate every other day for iron deficiency  Check CBC, CMP, and Magnesium on 01/10/22  Consider changing Latuda given possibly tardive dyskinesia cause tongue twisting.  Follow up with psychiatry, Dr. Earnest Rosier.  Consider changing medications to help with dyskinesia.    Benadryl as needed for itching  Monitor BP and HR twice daily  Monitor for drug or vitamin allergies given recent mouth itching   Nystatin four times daily swish and spit or swallow for 10 days   Schedule all recommended follow up visits at discharge.               Discharge Instructions:      Follow-up Information       Malcolm Metro, MD. Schedule an appointment as soon as possible for a visit in 1 week(s).    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 Schedule an appointment as soon as possible for a visit in 4 week(s).    Specialty: Gastroenterology  Why: vitamin deficiency and IDA eval for malabsorption  Contact information:  [address]  [address]  [place] [address]  [phone]  Additional information:  Clinic is located at [address] [place] [address] on the [facility] campus. It is across the street from [facility]. From [employer], take [address] to [address] [place].  Take the second right to turn on to [employer]. The [facility] will be directly ahead on the left.    Free Valet Parking (Monday through Friday 7:00am-4:30pm) is available at Zone #1 near the Main Entrance. Look for the signs along [employer]. Free Patient and Visitor Self-Parking is available in the "B" - [employer] [address] at the [place] end of the campus or just across from the [employer] on the "D" - [address] Lot.   From the "B" - [employer] [address] enter Retail banker on the level you have parked on (  B1, B1.5 or B3) on and take elevator to the 2nd floor.             [facility] Neurology [facility]. Schedule an appointment as soon as possible for a visit in 4 week(s).    Specialty: Neurology  Why: LE weakness  Contact information:  [address]  [place] [address]  [phone]             Marlaine Hind, MD. Schedule an appointment as soon as possible for a visit in 4 week(s).    Specialty: Neurological Surgery  Why: muscle biopsy for LE weakness and mild edema  Contact information:  [address]  [address] [address]  [phone]               Miachel Roux, MD. Schedule an appointment as soon as possible for a visit in 2 week(s).    Specialty: Cardiology  Why: A fib and tachycardia follow up  Contact information:  [address]  [address] [place] [address]  [phone]                             Discharge Diet: Cardiac Consistent Carbohydrates  Wound 12/27/21 Skin Tear Sacrum scattered areas of open unblanchable redness (Active)   Site Description Manson Passey 01/03/22 0800   Peri-wound Description Maroon 01/03/22 0800   Closure Open to air 01/03/22 0800   Drainage Amount None 01/03/22 0800   Drainage Description Serosanguinous 12/27/21 2000   Treatments Zinc - oxide paste 01/03/22 0800   Dressing Open to air 01/03/22 0800   Dressing Status Dry;Intact  01/03/22 0800   Number of days: 6        Peripheral IV 12/25/21 20 G Standard Right Antecubital (Active)   Site Assessment Clean;Dry;Intact 01/02/22 2200   Line Status Saline Locked;Flushed 01/02/22 2200   Tubing Dated? Yes 01/02/22 2200   Dressing Status Clean;Dry 01/02/22 2200   Dressing/Line Intervention Tubing changed 12/30/21 2200   Dressing Dated? Yes 01/02/22 2200   Dressing Change Due 12/31/21 01/02/22 2058   Reason Not Rotated Not due 01/02/22 2058   Number of days: 9     Urethral Catheter Non-latex 12 Fr. (Active)   Catheter necessity reviewed? 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 01/03/22 0500   Positioned catheter tubing for unobstructed urine flow: Yes 01/03/22 0500   Urine Output (mL) 650 mL 01/03/22 0317   Urine Output (mL) Total 650 mL 01/03/22 0317   Number of days: 2     Immunization History   Administered Date(s) Administered    COVID-19 mRNA MONOVALENT vaccine PRIMARY SERIES 12 years and above (Moderna) 100 mcg/0.5 mL 11/30/2019, 01/01/2020, 11/04/2020     Extended Emergency Contact Information  Primary Emergency Contact: Maureen Chatters  Address: [address]           [place], [place] [address] [place]  Home Phone: [phone]  Mobile Phone: [phone]  Relation: Daughter  Full Code  Discharge References/Attachments    None       Disposition:  [facility]     Discharge Medication List  Taking      acetaminophen 325 MG tablet  Dose: 650 mg  Commonly known as: TYLENOL  Take 2 tablets (650 mg total) by mouth every 4 (four) hours as needed for Pain     albuterol sulfate HFA 108 (90 Base) MCG/ACT inhaler  Dose: 2 puff  What changed:   how much to take  when to take  this  reasons to take this  additional instructions  Commonly known as: PROVENTIL  For: Asthma, Chronic Obstructive Lung Disease  Inhale 2 puffs into the lungs every 6 (six) hours as needed for Wheezing     apixaban 5 MG  Dose: 5 mg  Commonly known as: ELIQUIS  Take 1 tablet (5 mg total) by mouth every 12 (twelve) hours     budesonide-formoterol 80-4.5 MCG/ACT inhaler  Dose: 2 puff  Commonly known as: SYMBICORT  For: Asthma, Chronic Obstructive Lung Disease  Inhale 2 puffs into the lungs daily     butalbital-acetaminophen-caffeine 50-325-40 MG per tablet  Dose: 1 tablet  Commonly known as: FIORICET  Take 1 tablet by mouth every 6 (six) hours as needed for Headaches     calcium carbonate 500 MG chewable tablet  Dose: 1,000 mg  Commonly known as: TUMS  Chew 2 tablets (1,000 mg) by mouth every 6 (six) hours as needed for Heartburn     cholecalciferol 25 MCG (1000 UT) tablet  Dose: 25 mcg  Commonly known as: vitamin D3  Start taking on: January 04, 2022  Take 1 tablet (25 mcg) by mouth daily     diphenhydrAMINE 25 mg capsule  Dose: 25 mg  Commonly known as: BENADRYL  Take 1 capsule (25 mg) by mouth every 6 (six) hours as needed for Itching     ferrous sulfate 324 (65 FE) MG Tbec  Dose: 324 mg  Take 1 tablet (324 mg) by mouth every other day     HYDROmorphone 2 MG tablet  Dose: 2 mg  Commonly known as: DILAUDID  Take 1 tablet (2 mg) by mouth every 6 (six) hours as needed for Pain     Latuda 120 MG Tabs  Dose: 120 mg  What changed: Another medication with the same name was removed. Continue taking this medication, and follow the directions you see here.  Generic drug: lurasidone  Take 120 mg by mouth daily     lidocaine 5 %  Dose: 1 patch  Commonly known as: LIDODERM  Place 1 patch onto the skin every 24 hours Remove & Discard patch within 12 hours or as directed by MD     losartan 25 MG tablet  Dose: 25 mg  Commonly known as: COZAAR  Start taking on: January 04, 2022  Take 1 tablet (25 mg) by mouth daily     melatonin 3 mg  tablet  Dose: 3 mg  Take 1 tablet (3 mg) by mouth nightly as needed for Sleep     metoprolol succinate 100 MG 24 hr tablet  Dose: 100 mg  What changed:   medication strength  how much to take  Commonly known as: TOPROL-XL  Start taking on: January 04, 2022  Take 1 tablet (100 mg) by mouth daily     nortriptyline 10 MG capsule  Dose: 10 mg  Commonly known as: PAMELOR  Take 1 capsule (10 mg) by mouth nightly     nystatin 100000 UNIT/ML suspension  Dose: 500,000 Units  Commonly known as: MYCOSTATIN  Take 5 mLs (500,000 Units) by mouth 4 (  four) times daily     ondansetron 4 MG disintegrating tablet  Dose: 4 mg  Commonly known as: ZOFRAN-ODT  Take 1 tablet (4 mg) by mouth every 6 (six) hours as needed for Nausea     pantoprazole 40 MG tablet  Dose: 40 mg  Commonly known as: PROTONIX  Take 1 tablet (40 mg total) by mouth daily     pyridoxine 50 MG tablet  Dose: 50 mg  Commonly known as: B-6  Start taking on: January 04, 2022  Take 1 tablet (50 mg) by mouth daily     QUEtiapine 50 MG tablet  Dose: 50 mg  Commonly known as: SEROquel  Take 1 tablet (50 mg total) by mouth nightly     rOPINIRole 1 MG tablet  Dose: 1 mg  Commonly known as: REQUIP  Take 1 tablet (1 mg total) by mouth nightly     senna-docusate 8.6-50 MG per tablet  Dose: 2 tablet  Commonly known as: PERICOLACE  Take 2 tablets by mouth 2 (two) times daily as needed for Constipation     terazosin 2 MG capsule  Dose: 2 mg  Commonly known as: HYTRIN  Take 1 capsule (2 mg total) by mouth nightly     thiamine 100 MG tablet  Dose: 100 mg  Commonly known as: B-1  Start taking on: January 04, 2022  Take 1 tablet (100 mg) by mouth daily     Trintellix 10 MG Tabs tablet  Dose: 10 mg  Generic drug: vortioxetine  Take 10 mg by mouth daily     vitamins/minerals Tabs  Dose: 1 tablet  Start taking on: January 04, 2022  Take 1 tablet by mouth daily            STOP taking these medications      amLODIPine 2.5 MG tablet  Commonly known as: NORVASC     Ativan 0.5 MG tablet  Generic drug:  LORazepam     atorvastatin 40 MG tablet  Commonly known as: LIPITOR     furosemide 20 MG tablet  Commonly known as: LASIX     furosemide 40 MG tablet  Commonly known as: LASIX     lamoTRIgine 150 MG tablet  Commonly known as: LaMICtal     Motegrity 2 MG Tabs  Generic drug: Prucalopride Succinate     Ozempic (1 MG/DOSE) 4 MG/3ML Sopn  Generic drug: Semaglutide (1 MG/DOSE)            Minutes spent coordinating discharge and reviewing discharge plan: 70 minutes      Signed by: Frederich ChaViet Marene Gilliam, DO

## 2022-01-03 NOTE — Progress Notes (Addendum)
CASE MANAGEMENT PROGRESS NOTE      Date Time: 01/03/22 1:59 PM  Patient Name: [name]  Attending Physician: Frederich Cha, DO  Hospital Day: 9    Date of Admission:  12/25/2021    Reason for Admission:  Weakness of both legs [R29.898]       01/03/22 1356   Discharge Disposition   Patient preference/choice provided? Yes   Physical Discharge Disposition Acute Rehab   Receiving facility, unit and room number: [facility], room 501-2   Nursing report phone number: [phone]   Mode of Transportation Wheelchair [place]   Patient/Family/POA notified of transfer plan Yes;Patient informed only   Patient agreeable to discharge plan/expected d/c date? Yes   Bedside nurse notified of transport plan? Yes   Hard copy of narcotic RX sent with patient? Yes   Hard copy of DNR/Advance Directive sent with patient? N/A   IV antibiotics post discharge? N/A   Wound care post discharge? N/A   CM Interventions   Follow up appointment scheduled? No   Reason no follow up scheduled? Discharge to SNF/AR/LTAC   Multidisciplinary rounds/family meeting before d/c? Yes   Medicare Checklist   Is this a Medicare patient? Yes   If LOS 3 days or greater, did patient received 2nd IMM Letter? n/a  (Acute Rehab)     Pt will transport to [facility], via w/c van @ 6 pm. Pt stated she would notifiy her daughter.    Gabriel Carina, MSN RN  Case Management  [employer]  [phone]

## 2022-01-03 NOTE — PT Progress Note (Signed)
Physical Therapy Note    Physical Therapy Treatment  [name]      Post Acute Care Therapy Recommendations:     Discharge Recommendations:  Acute Rehab    If Acute Rehab  recommended discharge disposition is not available, patient will need Min/Mod assist for mobility and HHPT.     DME needs IF patient is discharging home: Wheelchair-manual, Front wheel walker    Therapy discharge recommendations may change with patient status.  Please refer to most recent note for up-to-date recommendations.    Patient anticipated to benefit from and to be able to engage in 3 hours of therapy a day for 5 days a week.      Assessment:   Significant Findings: HR 140s with limited mobility - RN aware    Pt received supine in bed eager and agreeable to participate in therapy session. Pt with continued proximal B LE weakness with increased difficulty lifting B LEs for supine to sit transition. Pt continues to require VCs for hand placement prior to sit <> stand transition 2/2 reaching up to pull from RW requiring MinA with genu recurvatum upon initial standing. Pt able to ambulate short distance with RW and MinA with gait distance limited 2/2 elevated HR. Pt continues to present with decreased strength, decreased sensation, endurance and balance deficits and would continue to benefit from skilled PT to maximize functional mobility and independence.     Assessment: Decreased UE strength, Decreased LE strength, Decreased safety/judgement during functional mobility, Decreased cognition, Decreased endurance/activity tolerance, Impaired coordination, Decreased sensation, Impaired motor control, Decreased functional mobility, Gait impairment, Decreased balance  Progress: Progressing toward goals  Prognosis: Good, With continued PT status post acute discharge  Risks/Benefits/POC Discussed with Pt/Family: With patient  Patient left without needs and call bell within reach. RN notified of session outcome.     Treatment Activities: Neuro  re-ed, Gait training, and Transfer training    Educated the patient to role of physical therapy, plan of care, goals of therapy and HEP, safety with mobility and ADLs, energy conservation techniques.    Plan:   Treatment/Interventions: Exercise, Gait training, Neuromuscular re-education, Functional transfer training, LE strengthening/ROM, Endurance training, Patient/family training, Bed mobility, Equipment eval/education, Compensatory technique education      PT Frequency: 4-5x/wk     Continue plan of care.    Unit: [facility] TOWER 7  Bed: F745/F745.01     Precautions and Contraindications:   Precautions  Other Precautions: falls, decreased B LE strength/sensation    Updated Medical Status/Imaging/Labs:   No results found.     Subjective:   Patient Goal: not stated    "Thank goodness youre here."  Pain Assessment  Pain Assessment: No/denies pain    Patient's medical condition is appropriate for Physical Therapy intervention at this time.  Patient is agreeable to participation in the therapy session. Nursing clears patient for therapy.    Objective:   Observation of Patient/Vital Signs:  Patient is in bed with telemetry and foley in place.  Pt wore mask during therapy session:No      Cognition/Neuro Status  Arousal/Alertness: Appropriate responses to stimuli  Attention Span: Appears intact  Orientation Level: Oriented X4  Memory: Appears intact  Following Commands: Follows all commands and directions without difficulty  Safety Awareness: minimal verbal instruction  Insights: Fully aware of deficits;Educated in safety awareness  Problem Solving: Assistance required to identify errors made;Assistance required to generate solutions;Assistance required to implement solutions;minimal assistance  Behavior: calm;cooperative  Motor Planning: ataxia  Coordination: GMC impaired       Functional Mobility:  Supine to Sit: Stand by Assist  Scooting to EOB: Stand by Assist  Sit to Stand: Minimal Assist;Increased  Time;Increased Effort;with instruction for hand placement to increase safety;bed elevated  Stand to Sit: Moderate Assist (2/2 uncontrolled descent)  Transfers  Bed to Chair: Minimal Assist  Device Used for Functional Transfer: front-wheeled walker  PMP - Progressive Mobility Protocol   PMP Activity: Step 6 - Walks in Room     Ambulation:  Ambulation: Minimal Assist;with front-wheeled walker  Pattern: shuffle;R foot decreased clearance;L foot decreased clearance;L genu recurvatum;decreased cadence;decreased step length;R genu recurvatum      Patient Participation: good  Patient Endurance: fair    Patient left with call bell within reach, all needs met and all questions answered. RN notified of session outcome and patient response.     SCDs: off  Fall mat: in place  Bed alarm: n/a  Chair alarm: on    Goals:  Goals  Goal Formulation: With patient  Time for Goal Acheivement: 10 visits  Goals: Select goal  Pt Will Roll Left: independent  Pt Will Roll Right: independent  Pt Will Go Supine To Sit: with stand by assist  Pt Will Perform Sit To Supine: with minimal assist (for LEs)  Pt Will Sit Edge of Bed: 11-15 min, modified independent  Pt Will Perform Sit to Stand: with minimal assist  Pt Will Transfer Bed/Chair: with rolling walker, with moderate assist  Pt Will Ambulate: 1-10 feet, with rolling walker, with moderate assist  Pt Will Propel Wheelchair: 51-150 feet, with stand by assist  Pt Will Demo / Request Pressure Relief: modified independent    Time of Treatment  PT Received On: 01/03/22  Start Time: 1120  Stop Time: 1140  Time Calculation (min): 20 min  Treatment # 6 out of 10 visits    PPE worn during session: procedural mask and gloves    Tech present: n/a  PPE worn by tech: N/A    Clent Jacks PT, DPT  Pager [phone]

## 2022-01-03 NOTE — Nursing Progress Note (Signed)
4 eyes in 4 hours pressure injury assessment note:      Completed with: Jill Side, RN  Unit & Time discharged: NT7 @ 2113              Bony Prominences: Check appropriate box; if wound is present enter wound assessment in LDA     Occiput:                 [x] WNL  []  Wound present  Face:                     [x] WNL  []  Wound present  Ears:                      [x] WNL  []  Wound present  Spine:                    [x] WNL  []  Wound present  Shoulders:             [x] WNL  []  Wound present  Elbows:                  [x] WNL  []  Wound present  Sacrum/coccyx:     [x] WNL  []  Wound present  Ischial Tuberosity:  [] WNL  []  Wound present  Trochanter/Hip:      [x] WNL  []  Wound present  Knees:                   [x] WNL  []  Wound present  Ankles:                   [x] WNL  []  Wound present  Heels:                    [x] WNL  []  Wound present  Other pressure areas:  []  Wound location       Device related: []  Device name:         LDA completed if wound present: n/a  Consult WOCN if necessary    Other skin related issues, ie tears, rash, etc, document in Integumentary flowsheet  -scattered bruising  -excoriation to groin, bilateral   -skin loss under L buttock (see photo)       Skin upon dicharge

## 2022-01-03 NOTE — Discharge Instr - AVS First Page (Addendum)
Reason for your Hospital Admission:  #Difficulty walking and bilateral leg and hand weakness - likely from statin induced myositis vs inflammatory myositis vs immune mediated myostitis.  #Leg muscle edema seen on MRI   #Vitamin D deficiency - repleted.   #Zinc deficiency - repleted  #B6 deficiency - repleted  #Iron deficiency anemia and AICD - eval for Celiac disease  #Upper abdominal and mid back pain - improved  #Poor appetite/malnourished   #Urinary retention - voiding trial at discharge  #Paroxysmal atrial fibrillation/tachycardia - rate is controlled, patient is on apixaban.    #Cardiomyopathy likely non-ischemic with LVEF 43%  #Hypertension   #COPD   #Type 2 diabetes mellitus - diet controlled  #Moderate malnutrition related to inadequate protein energy intake in the setting of acute illness as evidenced by intake <75% of estimated energy requirements for >1 week,  7.5% weight loss in 3 months,   mild depletion of muscle loss (temporalis, pectoralis major, quadriceps, gastrocnemius).   #Chronic HFrEF (euvolemic now)  #Hypokalemia - replaced  #Anemia - most likely poor nutrition and chronic disease.  IDA.    #Bipolar disorder   #Constipation -  #Mouth itching and swelling sensation likely from contrast allergy or megesterol          Instructions for after your discharge:    Follow up all hospital labs with Neurology   Schedule outpatient muscle biopsy with Neurosurgery in 2-4 weeks  Stop Ozempic due to weight loss and malnutrition   Recommend outpatient GI evaluation for malabsorption disorder given vitamin and mineral deficiencies.   Tamsulosin 0.4mg  po daily.   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  Consider alternative antipsychotic therapy.    Ferrous sulfate every other day for iron deficiency  Check CBC, CMP, and Magnesium on 01/10/22  Consider changing Latuda given possibly tardive dyskinesia cause tongue twisting.  Follow up with psychiatry, Dr. Earnest Rosier.  Consider changing medications to help with dyskinesia.    Benadryl as needed for itching  Monitor BP and HR twice daily  Monitor for drug or vitamin allergies given recent mouth itching   Nystatin four times daily swish and spit or swallow for 10 days   Schedule all recommended follow up visits at discharge.

## 2022-01-04 LAB — MYOSITIS SPECIFIC ANTIBODY PANEL
EJ Antibody: <11 SI (ref <11)
JO-1 Antibody: <11 SI (ref <11)
MDA-5 Antibody: <11 SI (ref <11)
MI-2 Alpha Antibody: <11 SI (ref <11)
Mi-2 Beta Antibody: <11 SI (ref <11)
NXP-2 Antibody: <11 SI (ref <11)
OJ Antibody: <11 SI (ref <11)
PL-12 Antibody: <11 SI (ref <11)
PL-7 Antibody: <11 SI (ref <11)
SRP Antibody: <11 SI (ref <11)
TIF-1y Antibody: <11 SI (ref <11)

## 2022-01-04 LAB — TISSUE TRANSGLUTAMINASE ANTIBODY PANEL
Tissue Transglutaminase AB, IgG: <1 U/mL (ref <15.0)
Tissue Transglutaminase Antibody, IgA: <1 U/mL (ref <15.0)

## 2022-01-04 LAB — ALDOLASE: Aldolase: 6.5 U/L (ref <=8.1)

## 2022-01-04 MED ORDER — METOPROLOL SUCCINATE ER 50 MG PO TB24
100.0000 mg | ORAL_TABLET | Freq: Every evening | ORAL | Status: DC
Start: 2022-01-05 — End: 2022-01-08
  Administered 2022-01-05 – 2022-01-08 (×4): 100 mg via ORAL
  Filled 2022-01-04 (×4): qty 2

## 2022-01-04 MED ORDER — DIPHENHYDRAMINE HCL 25 MG PO CAPS
25.0000 mg | ORAL_CAPSULE | Freq: Four times a day (QID) | ORAL | Status: DC | PRN
Start: 2022-01-04 — End: 2022-01-26
  Administered 2022-01-04 – 2022-01-26 (×12): 25 mg via ORAL
  Filled 2022-01-04 (×12): qty 1

## 2022-01-04 MED ORDER — METOPROLOL SUCCINATE ER 25 MG PO TB24
25.0000 mg | ORAL_TABLET | Freq: Every evening | ORAL | Status: AC
Start: 2022-01-04 — End: 2022-01-04
  Administered 2022-01-04: 25 mg via ORAL
  Filled 2022-01-04: qty 1

## 2022-01-04 NOTE — PT Eval Note (Signed)
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 female hx afib on Eliquis, HTN, HLD, DM, COPD, TIAs, COPD, bipolar disorder, [facility] admit 2/7-2/17/23 for rhabdomyolysis (peak CK 4464 - statin d/c'ed) + vomiting ?gastroparesis + starvation ketoacidosis + UTI, [facility] admit 2/18-2/20/23 for rhabdomyolysis (peak CK 2509) + transaminitis + bilateral leg weakness + right paresthesias, [facility] ER visit 3/2 for bilateral leg weakness who presents to [name] with difficulty standing and walking, bilateral leg/hand weakness, bilateral leg/hand/face paresthesias. She noted bilateral leg weakness with her prior [facility] admission 2/7-2/17 and was readmitted for bilateral leg weakness 2/18-2/20. The past 4 days she has had difficulty standing and walking. The past 4 days she notes new bilateral hand and face paresthesias and bilateral hand weakness. She has some shortness of breath but is speaking in full sentences. She went to [facility] ER 3/2 and was discharged. She saw her PMD who advised she go to an [facility] At [facility] she got MRI brain and CTL-spine which did not show a cause for her symptoms. [facility] called neurology Dr. Francesco Sor who said to consider IVIG for 5 days, LP, EMG. The LP was done at [facility]. Dr. Francesco Sor advised transfer to [facility]. These symptoms are sudden onset, moderate intensity, without alleviating factors.    Past Medical History:   Past Medical History:    Diagnosis Date    Anemia     Asthma     Atrial fibrillation     Chronic obstructive pulmonary disease     Convulsions     Depression     Diabetes mellitus     diet controlled     Hyperlipidemia     Hypertension     TIA (transient ischemic attack)        Prior Functioning: Everyday Activities  Self Care: Needed some help (Needed some help with LE self care)  Indoor Mobility (Ambulation): Independent (No AD for household distances)  Stairs: Independent  Functional Cognition: Independent  Prior Device Use: None of the given options (SPC for longer community distances)  Mobility  Transfers: Independent  Walking: Independent, Used assistive device (No AD for household distances. Used SPC for longer community distances.)  Walking assistive devices used: Financial trader mobility: Not applicable  Stair negotiation: Artist  Occupation: Former Gaffer Status: Retired for disability    Home Living Arrangements:  Living Arrangements: Children, Family members (Lives with daughter and 3 grandchildren (14, 60, 99 YO). 1 great grandchild (42 weeks old))  Type of Home: House  Home Layout: Two level, Laundry in basement, Work area in basement, Stairs to enter with rails (add number in comment) (2 level home. 3 STE with bilateral HR (cannot reach both at the same time). 1 FOS down to basement where patient resides (everything she needs in basement). Patient states it is too complicated to access the  basement in any other way other than the FOS.)  Bathroom Shower/Tub: Tub only  Armed forces technical officer: Academic librarian Accessibility:  (TBD)  DME Currently at Home: Cane, Single Point  Home Living - Notes / Comments: Lives with daughter (stay at home mom) and 3 grandchildren (14, 41, 67 YO), 1 great grandchild (53 weeks old). Daughter able to help during the day, older children able to help at night. Patient's living quarters in basement with FOS to get  down (L HR going down)    Subjective   Patient Report: "I am feeling really tired"  Patient/Caregiver Goals: I want to be independent  Pain Assessment:  Pain Assessment: No/denies pain    Objective   Vitals:  Heart Rate: (!) 125  BP: 92/63  MAP (mmHg): 73  Patient Position: Sitting    Interventions: Patient received in semi-reclined after direct handoff from OT. Patient expressing intense feelings of fatigue throughout this session. And per OT report, patient with syncopal episode directly prior to this session. Patient experiencing orthostatic hypotension with transfer from semi-reclined to sitting EOB. Patient's BP measured to be 118/75 in semi-reclined and dropping to 92/63 while sitting EOB. Patient unable to attempt standing at this time due to intense fatigue and low BP despite TED hose being donned. Patient made comfortable at the completion of the session in semi-reclined with all needs met.     Mobility Functional Status:   Current   Status Current Status Discharge Goal   Functional Area: Care Score:  Comments:    Roll Left and Right 88 Not attempted due to pt fatigue   Independent   Sit to Lying 88 Completed with increased time needed. Use of bed controls to elevate HOB   Independent   Lying to Sitting on Side of Bed 88 Completed with increased time needed. Use of bed controls to elevate HOB. Use of bedrails   Independent   Sit to Stand 88 Not completed due to low BP while sitting EOB, patient fatigue, and occasion of syncopal episode in session directly prior to this session   Independent   Chair/Bed-to-Chair Transfer 88 Not completed due to low BP while sitting EOB, patient fatigue, and occasion of syncopal episode in session directly prior to this session   Independent   Car Transfer 88 Not completed due to low BP while sitting EOB, patient fatigue, and occasion of syncopal episode in session directly prior to this session   Independent   Walk 10 Feet 88 Not completed due to low BP while sitting EOB,  patient fatigue, and occasion of syncopal episode in session directly prior to this session   Independent   Walk 50 Feet with Two Turns 88 Not completed due to low BP while sitting EOB, patient fatigue, and occasion of syncopal episode in session directly prior to this session   Independent   Walk 10 Feet on Uneven Surface 88 Not completed due to low BP while sitting EOB, patient fatigue, and occasion of syncopal episode in session directly prior to this session   Independent   Walk 150 Feet 88 Not completed due to low BP while sitting EOB, patient fatigue, and occasion of syncopal episode in session directly prior to this session   Independent   1 Step (Curb) 88 Not completed due to low BP while sitting EOB, patient fatigue, and occasion of syncopal episode in session directly prior to this session   Supervision or touching assistance   4 Steps 88  Not completed due to low BP while sitting EOB, patient fatigue, and occasion of syncopal episode in session directly prior to this session   Supervision or touching assistance   12 Steps 88 Not completed due to low BP while sitting EOB, patient fatigue, and occasion of syncopal episode in session directly prior to this session   Supervision or touching assistance   Wheel 50 Feet with Two Turns 9    Not applicable   Wheel 150 Feet 9    Not applicable     Assessment:  Cognition: no deficits noted.    Gross ROM  Neck/Trunk ROM: within functional limits  Right Upper Extremity ROM: needs focused assessment  Right Upper Extremity Overall ROM % reduced: reduced by 50% (R shoulder flexion reduced by 50%; R elbow ROM WFL)  Left Upper Extremity ROM: needs focused assessment  Left Upper Extremity Overall ROM % reduced: reduced by 50% (L shoulder flexion reduced by 50%; L elbow ROM WFL)  Right Lower Extremity ROM: needs focused assessment  Right Lower Extremity ROM % reduced: reduced by 75% (Unable to flex R hip against gravity; R knee ROM WFL)  Left Lower Extremity ROM: needs  focused assessment  Left Lower Extremity ROM % Reduced: reduced by 75% (Unable to flex L hip against gravity; L knee ROM WFL)        Gross Strength  Neck/Trunk Strength: WFL  Right Upper Extremity Strength: 4-/5  Left Upper Extremity Strength: 4-/5  R Hip Flexion: 2+/5  R Hip ABduction: 4-/5  R Hip ADduction: 4-/5  R Knee Extension: 4-/5  L Hip Flexion: 2+/5  L Hip ABduction: 4-/5  L Hip ADduction: 4-/5  L Knee Extension: 4-/5  Strength RLE  R Hip Flexion: 2+/5  R Hip ABduction: 4-/5  R Hip ADduction: 4-/5  R Knee Extension: 4-/5  Strength LLE  L Hip Flexion: 2+/5  L Hip ABduction: 4-/5  L Hip ADduction: 4-/5  L Knee Extension: 4-/5    Sensation  Sensation: Impaired sensation. L hemibody worse than R hemibody  Proprioception: No deficits noted on eval while sitting EOB    Motor Control  Gross: intact  FMC: FMC slightly delayed, needs further assessment    Balance  Sitting - Static: Good  Sitting - Dynamic: Good (Able to maintain balance while donning socks)  Standing - Static: Not tested  Standing - Dynamic: Not tested      Encounter Problems       Encounter Problems (Active)       PT - General and Outcome Measures       STG: Patient will tolerate Berg Balance Assessment       Start: 01/04/22   Expected End: 01/11/22            STG: Patient will tolerate       Start: 01/04/22   Expected End: 01/11/22            STG: Patient will tolerate       Start: 01/04/22   Expected End: 01/11/22            STG: Patient will tolerate entire therapy session OOB.       Start: 01/04/22   Expected End: 01/11/22            STG: Patient will tolerate a stair, curb and ramp assessment.        Start: 01/04/22   Expected End: 01/11/22  PT - General and Outcome Measures       LTG: Patient will complete transfers from bed <> chair with recommended DME, independent assistance in order to maximize independence and prevent falls upon [place] home.       Start: 01/04/22            LTG: Patient will walk 150 ft with  recommended DME, independent assistance in order to access areas of their home and community upon [place] home.       Start: 01/04/22            LTG: Patient will walk on uneven surfaces with recommended DME, independent assistance in order to access areas of their home and community upon [place] home.       Start: 01/04/22            LTG: Patient will ascend/descend 1 FOS with recommended DME, steadying assistance in order to safely and independently access their home/community       Start: 01/04/22            LTG: Patient/caregiver will return safe demonstration of proper supervision/assistance level for all mobility related needs in order to maximize safety upon transition to next level of care       Start: 01/04/22                   Education Documentation  Rehab techniques/procedure, taught by Marquis Buggy at 01/04/2022  2:43 PM.  Learner: Patient  Readiness: Acceptance  Method: Explanation  Response: Verbalizes Understanding    Precautions, taught by Marquis Buggy at 01/04/2022  2:43 PM.  Learner: Patient  Readiness: Acceptance  Method: Explanation  Response: Verbalizes Understanding    Plan of care, taught by Marquis Buggy at 01/04/2022  2:43 PM.  Learner: Patient  Readiness: Acceptance  Method: Explanation  Response: Verbalizes Understanding    Education Comments  No comments found.        Assessment & Plan   Assessment: Decreased UE ROM, Decreased LE ROM, Decreased LE strength, Decreased UE strength, Decreased endurance/activity tolerance, Decreased sensation, Impaired coordination, Impaired motor control, Decreased functional mobility, Decreased balance, Gait impairment    Patient is a 66 YO female presenting to [facility] with bilateral leg weakness with difficulty standing and walking, bilateral leg/hand weakness, bilateral leg/hand/face paresthesias. Patient unable to get OOB today due to syncopal episode directly prior to this session and experiencing orthostatic hypotension upon transferring from semi-reclined in  bed to sitting EOB. Only symptoms of orthostatic hypotension that patient complains of is intense fatigue. Patient presents with decreased ROM, strength, and sensation in the bilateral UE and LE. Patient with supportive daughter at home who helped patient at baseline with different activities around the home including LE bathing. Patient will benefit from intense skilled PT using an interdisciplinary approach to treatment in order to maximize functional independence and decrease BOC to promote a safe [place] to the home environment.     Plan:  Risks/Benefits/POC Discussed with Pt/Family: With patient  Treatment/Interventions: Gait training, Exercise, Stair training, Neuromuscular re-education, Functional transfer training, LE strengthening/ROM, Endurance training, Patient/family training, Equipment eval/education, Bed mobility, Compensatory technique education, Continued evaluation    Recommendation:  Discharge Recommendation: Home with home health PT  PT Therapy Recommendation: 5-6 days/week, 60-120 mins/day, 1:1 treatment  PT Estimated Length of Stay: 10-14 days from IE on 01/04/2022

## 2022-01-04 NOTE — Progress Notes (Signed)
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 Pt declined TR at this time citing that she will think about it.

## 2022-01-04 NOTE — Plan of Care (Signed)
Problem: Hemodynamic Status: Cardiac  Goal: Stable vital signs and fluid balance  Outcome: Progressing  Flowsheets (Taken 01/04/2022 1751)  Stable vital signs and fluid balance:   Monitor/assess vital signs and telemetry per unit protocol   Assess signs and symptoms associated with cardiac rhythm changes   Monitor intake/output per unit protocol and/or LIP order   Monitor for leg swelling/edema and report to LIP if abnormal   Monitor lab values   Weigh on admission and record weight daily     Problem: Inadequate Tissue Perfusion  Goal: Adequate tissue perfusion will be maintained  Outcome: Progressing  Flowsheets (Taken 01/04/2022 1751)  Adequate tissue perfusion will be maintained:   Monitor/assess vital signs   Monitor/assess lab values and report abnormal values   Monitor/assess neurovascular status (pulses, capillary refill, pain, paresthesia, paralysis, presence of edema)   Monitor intake and output   Monitor for signs and symptoms of a pulmonary embolism (dyspnea, tachypnea, tachycardia, confusion)   Encourage/assist patient as needed to turn, cough, and perform deep breathing every 2 hours   Reinforce use of ordered respiratory interventions (i.e. CPAP, BiPAP, Incentive Spirometer, Acapella, etc.)     Problem: Nutrition  Goal: Nutritional intake is adequate  Outcome: Progressing  Flowsheets (Taken 01/04/2022 1751)  Nutritional intake is adequate:   Monitor daily weights   Assist patient with meals/food selection   Encourage/perform oral hygiene as appropriate   Encourage/administer dietary supplements as ordered (i.e. tube feed, TPN, oral, OGT/NGT, supplements)   Assess anorexia, appetite, and amount of meal/food tolerated   Consult/collaborate with Clinical Nutritionist   Include patient/patient care companion in decisions related to nutrition   Consult/collaborate with Speech Therapy (swallow evaluations)   Allow adequate time for meals   Pt A/ox4 follows commands  pt BP soft and HR 120's MD notified pt BP  was very low and pt was symptomatic and almost pass out during PT session Dr Carlynn Purl aware . Abd binder Ted hose applied By PT.  BLE weaker than upper . 1-2  assist . Poor appetite and pt encouraged to increase intake . Foley in placed for retention . Excoriation skin pari area Z Gurd applied and pt in  specialty bed. Pt safety and comfort will continue monitor.

## 2022-01-04 NOTE — UM Notes (Signed)
Case Management  Inpatient Rehabilitation Progress Note    Team conference attended today by MD, nursing, PPS, therapy, neuropsyc, and this CM. Report filed by scribe and signed by this CM. Please refer to the [facility] Plan of Care.     Inpatient Rehab Team Conference 01/04/22   Effective from: 01/04/2022  Effective to: 01/11/2022    Plan ID: [id number]                     Participants as of 01/04/2022     Name Type Comments Contact Info     Cheryll Dessert, MD Attending Provider   [phone]     Silverio Decamp Social Work         Roselyn Bering Therapies         Cher Nakai, RN Registered Nurse         Katy Apo, RN Registered Nurse         Marquis Buggy PT         Doylene Canning, PT Physical Therapist         Davonna Belling, OT OT             Patient Demographics      Patient Name  [name], [name] Legal Sex  Female DOB  1956-09-10 SSN  [id number] Address  [address]  [place] [address] Phone  [phone] (Home) *Preferred*  [phone] (Mobile)         Current Problems                     Noted     CVA (cerebral vascular accident) 08/21/2018     Chest pain 08/22/2018     Chest pain with high risk of acute coronary syndrome 01/06/2019     Paroxysmal atrial fibrillation 01/06/2019     Hypertension 01/06/2019     Hyperlipidemia 01/06/2019     Seizure disorder 01/06/2019     History of gastrointestinal hemorrhage 01/06/2019     Depression 01/06/2019     Anemia 01/06/2019     Asthma 01/06/2019     Non-traumatic subcutaneous emphysema 01/07/2019     Chest pain with moderate risk for cardiac etiology 05/11/2019     Left-sided weakness 05/20/2019     History of asthma 06/16/2019     History of transient ischemic attack (TIA) 06/16/2019     Type 2 diabetes mellitus 06/16/2019     Syncope and collapse 06/16/2019     Weakness of both legs 12/25/2021     Hypokalemia 12/26/2021     Thoracic back pain 12/26/2021     Gastroparesis 12/26/2021     Guillain Barr syndrome 12/25/2021     Overview       Added automatically from request for  surgery [id number]           Myositis 01/03/2022         Interdisciplinary Care Plan           Interdisciplinary Care Plan (Active)           Rehab Team Goals            Bladder Management      Dates: Start: 01/03/22              LTG: Pt will be 100% continent and independent with bladder elim at [place]      Dates: Start: 01/03/22   Expected End: 01/29/22  Goal Note filed on 01/03/22 2343 by Kerry Fort., RN      Foley indwelling on admission. Pt had trial earlier in the day 3/13 with foley removal but unable to spontaneously void and foley replaced prior to transfer to [place].                     Anticipate/assist with toileting needs      Dates: Start: 01/03/22        Description:                    Monitor intake and output      Dates: Start: 01/03/22        Description:                    Use incontinence wipes for cleaning urine, stool and caustic drainage; foley care PRN.      Dates: Start: 01/03/22        Description:                                  Cardiac Function      Dates: Start: 01/03/22              LTG: Pt will be independent with BP and pulse management and medication at [place]      Dates: Start: 01/03/22   Expected End: 01/29/22            Goal Note filed on 01/03/22 2343 by Kerry Fort., RN      On medication to manage pulse and BP. Tachy 110-118 at times.                     Monitor and assess vital signs and oxygen saturation.      Dates: Start: 01/03/22        Description:                    Educate patient about medications      Dates: Start: 01/03/22        Description:                                  Pain Management      Dates: Start: 01/03/22              LTG: Pt will achieve good pain control with current medication regimen as evidenced by ability to participate in 100% of therapy sessions.      Dates: Start: 01/03/22   Expected End: 01/29/22            Goal Note filed on 01/03/22 2343 by Kerry Fort., RN      Reports pain at "6" on admission. On lidoderm patch and prn dilaudid to  manage.                     Administer both scheduled pain meds and prn meds for break through pain      Dates: Start: 01/03/22        Description:                    Assess and monitor patient's pain using appropriate pain scale.      Dates: Start: 01/03/22        Description:  Assess characteristics of pain      Dates: Start: 01/03/22        Description:                                  Safety Risk      Dates: Start: 01/03/22              LTG: Pt will recognize limitations and use call bell 100% of the time prior to transfers in order to prevent fsalls      Dates: Start: 01/03/22   Expected End: 01/29/22            Goal Note filed on 01/03/22 2343 by Kerry Fort., RN      Pt is at the yellow level for falls risk. She is able to return demo of call bell and reports she will comply with fall protocol.                     Hourly rounding      Dates: Start: 01/03/22        Description:                    Teach/back demonstrate use of call bell      Dates: Start: 01/03/22        Description:                    Encourage patient to call for help when getting up to use the bathroom .      Dates: Start: 01/03/22        Description:                                  Skin Wound Management      Dates: Start: 01/03/22              LTG: Excoriation at buttocks will resolve by [place]      Dates: Start: 01/03/22   Expected End: 01/29/22            Goal Note filed on 01/03/22 2343 by Kerry Fort., RN      Excoriation at buttocks. No open areas. Mepilex applied. Specialty bed to promote skin integrity ordered but not yet arrived on the unit.                     Use incontinence wipes for cleaning urine, stool and caustic drainage; foley care PRN.      Dates: Start: 01/03/22        Description:                    Reposition as tolerated      Dates: Start: 01/03/22        Description:                                               Interdisciplinary Care Plan (Resolved)      There are no resolved problems.                          Team Discussion  Team Conference Note     Patient Name:  [name]       Medical Record Number: [id number]   Date of Birth: 24-Nov-1955  Sex: Female          Room/Bed:  M501/M501-02     Admitting Diagnosis: 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];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]   Admit Date/Time:  01/03/2022 10:00 PM  Admission Comments: No comment available      Primary Diagnosis: Myositis          Patient Active Problem List     Diagnosis Date Noted    Myositis 01/03/2022    Hypokalemia 12/26/2021    Thoracic back pain 12/26/2021    Gastroparesis 12/26/2021    Weakness of both legs 12/25/2021    Guillain Barr syndrome 12/25/2021    History of asthma 06/16/2019    History of transient ischemic attack (TIA) 06/16/2019    Type 2 diabetes mellitus 06/16/2019    Syncope and collapse 06/16/2019    Left-sided weakness 05/20/2019    Chest pain with moderate risk for cardiac etiology 05/11/2019    Non-traumatic subcutaneous emphysema 01/07/2019    Chest pain with high risk of acute coronary syndrome 01/06/2019    Paroxysmal atrial fibrillation 01/06/2019    Hypertension 01/06/2019    Hyperlipidemia 01/06/2019    Seizure disorder 01/06/2019    History of gastrointestinal hemorrhage 01/06/2019    Depression 01/06/2019    Anemia 01/06/2019    Asthma 01/06/2019    Chest pain 08/22/2018    CVA (cerebral vascular accident) 08/21/2018         Vital Signs  Blood Pressure: 105/70  Temperature: 97.4 F (36.3 C)  Pulse: (!) 112  Respirations: 16  Pain Scale Used: Numeric Scale (0-10)  Pain Score: 4  Pain Location: Back  Pain Orientation: Lower  Pain Descriptors: Aching  Pain Frequency: Increases with movement        Weight and Nutrition  Admission Weight: 60.8 kg (134 lb 1.6 oz)  Current Weight: 60.8 kg (134 lb 1.6 oz)  Diet Type: Consistent Carbohydrate, Thin Liquid     Plan of Care  Anticipated Discharge Date: Jan 18, 2022  Discharge Plan:    Fall Risk Level: Yellow  Is the duration of therapy 15 hours over 7 days instead of the  standard 3 hours of therapy, 5 out of 7 days a week?: No  The patient will benefit from continued services by:   Physical Therapy Recommendation:    Occupational Therapy Recommendation:             The following is a list of patient problems that have been identified by the interdisciplinary team:   Encounter Problems         Encounter Problems (Active)         Bladder Management         LTG: Pt will be 100% continent and independent with bladder elim at [place] (Progressing)         Start: 01/03/22   Expected End: 01/29/22           Goal Note         Foley indwelling on admission. Pt had trial earlier in the day 3/13 with foley removal but unable to spontaneously void and foley replaced prior to transfer to [place].                    Cardiac Function         LTG: Pt will be independent with  BP and pulse management and medication at [place] (Progressing)         Start: 01/03/22   Expected End: 01/29/22           Goal Note         On medication to manage pulse and BP. Tachy 110-118 at times.                    Compromised Tissue integrity         Damaged tissue is healing and protected         Start: 01/04/22        Interventions:  1.      Monitor/assess Braden scale every shift  2.      Provide wound care per wound care algorithm  3.      Reposition patient every 2 hours and as needed unless able to reposition self  4.      Increase activity as tolerated/progressive mobility  5.      Relieve pressure to bony prominences for patients at moderate and high risk  6.      Avoid shearing injuries   7.      Keep intact skin clean and dry  8.      Use bath wipes, not soap and water, for daily bathing   9.      Use incontinence wipes for cleaning urine, stool and caustic drainage; Foley care as needed   10.    Monitor external devices/tubes for correct placement to prevent pressure, friction and shearing   11.    Encourage use of lotion/moisturizer on skin  12.    Monitor patient's hygiene practices  13.    Consult/collaborate with wound  care nurse   14.    Utilize specialty bed  15.    Consider placing an indwelling catheter if incontinence interferes with healing of stage 3 or 4 pressure injury           Nutritional status is improving         Start: 01/04/22        Interventions:  1.      Assist patient with eating   2.      Allow adequate time for meals   3.      Encourage patient to take dietary supplement(s) as ordered   4.      Collaborate with Clinical Nutritionist  5.      Include patient/patient care companion in decisions related to nutrition              Pain Management         LTG: Pt will achieve good pain control with current medication regimen as evidenced by ability to participate in 100% of therapy sessions. (Progressing)         Start: 01/03/22   Expected End: 01/29/22           Goal Note         Reports pain at "6" on admission. On lidoderm patch and prn dilaudid to manage.                    Safety Risk         LTG: Pt will recognize limitations and use call bell 100% of the time prior to transfers in order to prevent fsalls (Progressing)         Start: 01/03/22   Expected End: 01/29/22  Goal Note         Pt is at the yellow level for falls risk. She is able to return demo of call bell and reports she will comply with fall protocol.                    Skin Wound Management         LTG: Excoriation at buttocks will resolve by [place] (Progressing)         Start: 01/03/22   Expected End: 01/29/22           Goal Note         Excoriation at buttocks. No open areas. Mepilex applied. Specialty bed to promote skin integrity ordered but not yet arrived on the unit.                        Rehab Team Discussion           Self Care Functional Status:    Admission Assessment Current Status Discharge  Goal   Functional Area: Care Score:  Care Score:     Eating 5 5 (01/03/22 2300)     Oral Hygiene         Toileting Hygiene 7 7 (01/03/22 2300)     Shower/Bathe Self         Upper Body Dressing         Lower Body Dressing         Putting  On/Taking Off Footwear               Mobility Functional Status:    Admission Assessment Current   Status  Discharge   Goal   Functional Area: Care Score:  Care Score:     Roll Left and Right 4 4 (01/03/22 2300)     Sit to Lying 4 4 (01/03/22 2300)     Lying to Sitting on Side of Bed 3 3 (01/03/22 2300)     Sit to Stand 3 3 (01/03/22 2300)     Chair/Bed-to-Chair Transfer 3 3 (01/03/22 2300)     Toilet Transfer 7 7 (01/03/22 2300)     Car Transfer         Walk 10 Feet         Walk 50 Feet with Two Turns         Walk 150 Feet         Walking 10 Feet on Uneven Surfaces         1 Step (Curb)         4 Steps         12 Steps         Wheel 50 Feet with Two Turns         Wheel 150 Feet         Picking Up Object

## 2022-01-04 NOTE — PT Progress Note (Signed)
Physical Therapy  Inpatient Rehabilitation Daily Progress Note    Patient Name:  [name]       Medical Record Number: [id number]   Date of Birth: Apr 11, 1956  Sex: Female          Room/Bed:  M501/M501-02    Therapy Received:  Start Time: 1500   Stop Time: 1600  Total Therapy Minutes: 60    Rehabilitation Precautions/Restrictions:  Weight Bearing Status: no restrictions  Precaution Instructions Given to Patient: Yes  Other Precautions: Falls; decreased LE strength and sensation    Rehab Diagnosis: 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]  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]     Subjective   Patient Report: "I'm tired"  Patient/Caregiver Goals: I want to be independent  Pain Assessment:  Pain Assessment: No/denies pain    Objective   Vitals:  Heart Rate: (!) 108  BP: 112/73  MAP (mmHg): 86  Patient Position: Lying    Interventions: Handoff with RN appreciated prior to session, pt cleared for PT.   PT received sup in bed agreeable to session BP 115/73. Sup>sit completed with SBA pt using her UE to assist with bringing BLE over EOB. Lateral scoot transfer bed<>wc with minA however upon sititng upright in wc pt with dizziness BP 109/71 however returned to bed rather than remaining seated due to recent history of passing out.   Pt completed ankle pumps, heel slides, quad sets, glute sets, TA activation, supine hip abd/add, sup adduction squeeze, scap retraction with red resistance band, 2lb bicep curl, overhead press with body weight with rest breaks in between exercises due to fatigue.         Education Documentation  Precautions, taught by Danley Danker, PT at 01/04/2022  3:00 PM.  Learner: Patient  Readiness: Acceptance  Method: Explanation  Response: [name] Understanding, Needs Reinforcement    Plan of care, taught by Danley Danker, PT at 01/04/2022  3:00 PM.  Learner: Patient  Readiness: Acceptance  Method: Explanation  Response: Verbalizes Understanding, Needs Reinforcement    Functional transfers/mobility, taught by Danley Danker, PT at 01/04/2022  3:00 PM.  Learner: Patient  Readiness: Acceptance  Method: Explanation  Response: Verbalizes Understanding, Needs Reinforcement    Fall prevention/balance training, taught by Danley Danker, PT at 01/04/2022  3:00 PM.  Learner: Patient  Readiness: Acceptance  Method: Explanation  Response: Verbalizes Understanding, Needs Reinforcement    Education Comments  No comments found.         Assessment & Plan   Pt limited by dizziness and recent history of orthostatic hypotension. Pt with decreased activity tolerance benefiting form therapeutic rest breaks in between exercises. She will continue to benefit from skilled PT services to increase functional independence prior to discharge.     Plan:  Risks/Benefits/POC Discussed with Pt/Family: With patient  Treatment/Interventions: Gait training, Exercise, Stair training, Neuromuscular re-education, Functional transfer training, LE strengthening/ROM, Endurance training, Patient/family training, Equipment eval/education, Bed mobility, Compensatory technique education, Continued evaluation    Recommendation:  Discharge Recommendation: Home with home health PT  PT Therapy Recommendation: 5-6 days/week, 60-120 mins/day, 1:1 treatment  PT Estimated Length of Stay: 10-14 days from IE on 01/04/2022

## 2022-01-04 NOTE — OT Eval Note (Signed)
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 year old right hand-dominant female   hx afib on Eliquis, HTN, HLD, DM, COPD, TIAs, COPD, bipolar disorder, [facility] admit 2/7-2/17/23 for rhabdomyolysis (peak CK 4464 - statin d/c'ed) + vomiting ?gastroparesis + starvation ketoacidosis + UTI, [facility] admit 2/18-2/20/23 for rhabdomyolysis (peak CK 2509) + transaminitis + bilateral leg weakness + right paresthesias, [facility] ER visit 3/2 for bilateral leg weakness who presents to [facility] with difficulty standing and walking, bilateral leg/hand weakness, bilateral leg/hand/face paresthesias. She noted bilateral leg weakness with her prior [facility] admission 2/7-2/17 and was readmitted for bilateral leg weakness 2/18-2/20. The past 4 days she has had difficulty standing and walking. The past 4 days she notes new bilateral hand and face paresthesias and bilateral hand weakness. She has some shortness of breath but is speaking in full sentences. She went to [facility] ER 3/2 and was discharged. She saw her PMD who advised she go to an [facility]. At [facility] she got MRI brain and CTL-spine which did not show a cause for her symptoms. [facility] called neurology Dr. Francesco Sor who said to consider IVIG for 5 days, LP, EMG. The LP was done at [facility]. Dr. Francesco Sor advised transfer to [facility]. These symptoms are sudden onset, moderate intensity, without alleviating factors.  Following admission, patient was  seen by neurology consult who performed MRI of the entire spine and brain, without contrast which came back to show no major issues except on the lumbar area L4-L5 disc disease with facet arthrosis and degenerative changes, neurology team recommended LP and CSF studies so far are unrevealing, patient continued to complain of having bandlike pain around the epigastric area & in the back, there was tenderness on the right side of the thoracic vertebral area in the muscle, Lidoderm patch applied, patient is getting Protonix and Maalox, has not had a bowel movement for that reason given lactulose and stool softeners.  Patient was sent from [facility] multiple hospital for consideration of IVIG with a suspicion of GBS, EMG currently pending.   Due to her history of not eating well and has been losing weight over the past few weeks, neurology team felt may be nutritional and started on on IV thiamine, patient has been requesting narcotics for the pain that she is got but not really having any tenderness on examination.  Patient did NIF and vital capacity on the morning of 12/26/2021 which came back -12 cm H2O and vital capacity 1.5 L.  Patient is not in any respiratory distress, not needing any oxygen at this time.  Patient had additional laboratory studies including HIV and syphilis which came back negative, negative for COVID 19, potassium was noted to be low for which replacement was given, CPK slightly elevated at 239.  Cultures remain negative.   Patient is A&O X 4 and following commands, she is tolerating a regular diet, last BM  was 3/11. No hearing problems, wears glasses for reading. Lives with daughter is usually able to walk around the house, uses a RW for community level ambulation, patient usually requires help from her daughter for LE bathing but otherwise independent. Patient with IJ Line which was placed for PLEX, however no longer going to have PLEX so it will be discontinued. Patient is working with PT and OT  and would benefit from inpatient AR to get her as close to her pre-morbid status as possible and to manage his co-morbid conditions as above.      Patient was admitted to acute rehab on 01/03/2022     Past Medical History:   Past Medical History:   Diagnosis Date    Anemia     Asthma     Atrial fibrillation     Chronic obstructive pulmonary disease     Convulsions     Depression     Diabetes mellitus     diet controlled     Hyperlipidemia     Hypertension     TIA (transient ischemic attack)        Prior Level of Function  Prior level of function: Needs assistance with ADLs, Ambulates with assistive device  Assistive Device: Single point cane, Front wheel walker, Wheelchair  Baseline Activity Level: Community ambulation, Household ambulation  Driving: does not drive  Dressing - Upper Body: independent  Dressing - Lower Body: maximal assist  Cooking: light meal prep, Yes  Feeding: independent  Bathing: moderate assist  Grooming: independent  Toileting: independent  Employment: Disabled  DME Currently at Home: ADL- Paediatric nurse, [place], Single [place], [place], [employer], Biomedical scientist, Manual    Home Living Arrangements:  Living Arrangements: Children, Family members, Other (Comment) (Lives with daughter, son -in -law, 3 grandchildren [phone]), 1 great grandchild (infant).)  Type of Home: House  Home Layout: Two level, Performs ADL's on one level, Stairs to enter without rails (add number in comment), Other (Comment) (Stays in the fully equiped  basement, 2 FOS BIHR; pt reports a back lower entrance with 3 STE)  Bathroom Shower/Tub: Tub only  Firefighter: Midwife: Biomedical scientist:  (TBD)  DME Currently at Home: ADL- Paediatric nurse, Environmental consultant, [employer], [place], [place]  Home Living - Notes / Comments: Lives with daughter (stay at home mom) and 3 grandchildren (14, 28, 22 YO), 1 great grandchild (90 weeks old). Daughter able to help during the day, older children able to help at  night. Patient's living quarters in basement with FOS to get down (L HR going down)    Subjective   Patient Report: "I think I did" (when OT pt seemed to have had syncopal episode)  Patient/Caregiver Goals: I want to be independent  Pain Assessment:  Pain Assessment: No/denies pain    Objective   Vitals:  Heart Rate: (!) 112  BP: 115/75  BP Method: Automatic  MAP (mmHg): 88  Patient Position: Lying    Interventions:     OT orders received. Pt received lying in bed, agreeable to therapy.     Pt participated in ADL eval this am. See below for details.     During LB dressing, pt had syncopal episode in standing, with eyes rolling back, and pt's limbs becoming rigid, requiring rapid transition back to supine. Pt became responsive immediately after returning to supine position. Nsg and next therapist notified.     Pt left lying in bed with all needs met. Handoff to  nsg completed.     Self Care Functional Status:   Current Status Current Status Discharge Goal   Functional Area: Care Score: Comments:    Eating 5    Independent   Oral Hygiene 5 Per clinical judgment   Independent   Toileting Hygiene 2 Per clinical judgment, assist with clothing management, rear peri care   Independent   Shower/Bathe Self 88 Pt performed seated on shower chair at home; per clinical judgment, assist with BLE (pt was receiving assist with LB bathing from daughter at home)   Supervision or touching assistance   Upper Body Dressing 4 cues to doff gown prior to putting on shirt   Independent   Lower Body Dressing 2 assist to manage foley, thread BLE, and advance over hips in standing   Supervision or touching assistance   Putting On/Taking Off Footwear 7 Pt declined shoes, but donned socks EOB without assistance   Supervision or touching assistance     Mobility Functional Status:   Current   Status Current Status Discharge Goal   Functional Area: Care Score: Comments:    Roll Left and Right 88 use of bedrails       Sit to Lying 88 Total assist to  return to supine d/t syncopal episode in standing Independent   Lying to Sitting on Side of Bed 88 with use of bedrails   Independent   Sit to Stand 88 unable to attempt without use hospital bed controls; from elevated surface with FWW, cga - min A   Independent   Chair/Bed-to-Chair Transfer 88 Not attempted d/t syncopal episode, and low BP in sitting   Independent   Toilet Transfer 88 Not attempted d/t syncopal episode, and low BP in sitting   Independent   Picking Up Object 88 unable to perform in standing, per clinical judgment; pt with syncopal episode in standing   Independent (with use of AE)       Assessment:  Cognition: A & O x 4. Pt demonstrated possible cog deficits during UB dressing, requiring cueing to doff gown prior to donning shirt.    Vision - Complex Assessment  Additional Comments: No obvious deficits noted on eval. Pt reported possible nystagmus earlier in recent medical hx, but that has resolved, per pt.    Inspection/Posture  Inspection/Posture: Pt appeared very fatigued, reporting that she wasn't able to sleep well the night prior. Foley catheter    Gross ROM  Right Upper Extremity ROM: within functional limits  Left Upper Extremity ROM: within functional limits        Gross Strength  Right Upper Extremity Strength: needs focused assessment  R Shoulder Flexion: 3+/5  R Shoulder ABduction: 4-/5  R Elbow Flexion: 4/5  R Elbow Extension: 4/5  Left Upper Extremity Strength: needs focused assessment  L Shoulder Flexion: 3+/5  L Shoulder ABduction: 3+/5  L Elbow Flexion: 4-/5  L Elbow Extension: 4/5    Sensation  Sensation: Slightly impaired sensation, L > R. Some areas of reduced sensitivity in L palm. Pt felt most light touch stimuli, but with somewhat impaired tactile localization  Proprioception: No deficits noted on eval    Motor Control  Gross: intact  FMC: slightly delayed and imprecise opposition    Balance  Balance: needs focused assessment  Sitting - Static: Good  Sitting - Dynamic:  Good  Standing - Static: Poor (Requires UE support x 2 on walker, unable to lift a hand off of walker)  Standing - Dynamic: Other (Comment) (unable to  test d/t syncopal episode, but likely poor)        Encounter Problems       Encounter Problems (Active)       OT General       LTG: Pt will complete LB dressing with use of compensatory strategies and AE prn with spv.        Start: 01/04/22            STG: Pt will complete LB dressing with mod A with use of compensatory strategies and AE prn with spv.        Start: 01/04/22   Expected End: 01/11/22            LTG: Pt will increase dynamic standing balance and upright tolerance to allow pt to advance pants over hips without physical assistance in standing.        Start: 01/04/22            LTG: Pt will complete LB bathing with use of compensatory strategies and AE prn with spv.        Start: 01/04/22            LTG: Pt will complete tub transfers with use of recommended DME and AE prn with spv.        Start: 01/04/22            LTG: Pt will perform UE HEP with occasional verbal cues.        Start: 01/04/22            STG: Pt will increase dynamic standing balance to fair +.        Start: 01/04/22                   Education Documentation  Rehab techniques/procedure, taught by Charlton Amor, OT at 01/04/2022  3:51 PM.  Learner: Patient  Readiness: Acceptance  Method: Explanation  Response: Verbalizes Understanding    Plan of care, taught by Charlton Amor, OT at 01/04/2022  3:51 PM.  Learner: Patient  Readiness: Acceptance  Method: Explanation  Response: Verbalizes Understanding    Education Comments  No comments found.        Assessment & Plan   Assessment: decreased strength, balance deficits, decreased independence with ADLs, sensory impairment, decreased cognition, decreased independence with IADLs, decreased endurance/activity tolerance (possible cognition impairments; may need further screening)    Ms. Kreis is a 66 y/o female presenting to AR with  suspected GBS and myositis. Pt lives with her daughter and grandchildren, as well a a great grand child in a 2 story home in a basement apartment with bed/bath on that level. Pt reports that there is a back door with 3 STE and BLHR to enter basement apartment. Prior to admission, pt's daughter was helping pt with LB dressing and bathing and pt used a shower seat in the tub. Pt reports independence in toileting, and light meal prep. Pt was neither driving, nor working. Pt owns a FWW, SPC, manual w/c, and shower chair. Pt's goals are to be "fully self reliant." Pt presents with the above deficits, and would benefit from the below treatment interventions to increase pt's independence in ADLs and functional mobility.     Plan:  Risks/Benefits/POC Discussed with Pt/Family: With patient  Treatment Interventions: ADL retraining, Functional transfer training, UE strengthening/ROM, Endurance training, Cognitive reorientation, Patient/Family training, Equipment eval/education, Neuro muscular reeducation, Fine motor coordination activities, Compensatory technique education    Recommendation:  Discharge Recommendation: Home with home  health OT, Home with home health PT  OT Therapy Recommendation: 5-6 days/week, 60-120 mins/day, 1:1 treatment, Group therapy  OT Estimated Length of Stay: 14 days    Groups appropriate for patient:   UE Group, Mobility Group    Group Justification: Increase aerobic endurance capacity utilizing peer support  Practice /carry over skills learned in individual sessions with new partners, clinicians, and context  Experience modeling by peers

## 2022-01-04 NOTE — Care and Service Plan (Signed)
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]   Admit Date/Time:  01/03/2022 10:00 PM  Admission Comments: No comment available     Primary Diagnosis: Myositis    Patient Active Problem List    Diagnosis Date Noted    Myositis 01/03/2022    Hypokalemia 12/26/2021    Thoracic back pain 12/26/2021    Gastroparesis 12/26/2021    Weakness of both legs 12/25/2021    Guillain Barr syndrome 12/25/2021    History of asthma 06/16/2019    History of transient ischemic attack (TIA) 06/16/2019    Type 2 diabetes mellitus 06/16/2019    Syncope and collapse 06/16/2019    Left-sided weakness 05/20/2019    Chest pain with moderate risk for cardiac etiology 05/11/2019    Non-traumatic subcutaneous emphysema 01/07/2019    Chest pain with high risk of acute coronary syndrome 01/06/2019    Paroxysmal atrial fibrillation 01/06/2019    Hypertension 01/06/2019    Hyperlipidemia 01/06/2019    Seizure disorder 01/06/2019    History of gastrointestinal hemorrhage 01/06/2019    Depression 01/06/2019    Anemia 01/06/2019    Asthma 01/06/2019    Chest pain 08/22/2018    CVA (cerebral vascular accident) 08/21/2018       Vital Signs  Blood Pressure: 105/70  Temperature: 97.4 F (36.3 C)  Pulse: (!) 112  Respirations: 16  Pain Scale Used: Numeric Scale (0-10)  Pain Score: 4  Pain Location: Back  Pain Orientation: Lower  Pain Descriptors: Aching  Pain Frequency: Increases with movement      Weight and Nutrition  Admission Weight: 60.8 kg (134 lb 1.6 oz)  Current Weight: 60.8 kg (134 lb 1.6 oz)  Diet Type: Consistent Carbohydrate, Thin Liquid    Plan of Care  Anticipated Discharge Date: Jan 18, 2022  Discharge Plan:    Fall Risk Level: Yellow  Is the duration of therapy 15 hours over 7 days instead of the standard 3 hours of therapy, 5 out of 7 days a week?:  No  The patient will benefit from continued services by:   Physical Therapy Recommendation:    Occupational Therapy Recommendation:          The following is a list of patient problems that have been identified by the interdisciplinary team:   Encounter Problems       Encounter Problems (Active)       Bladder Management       LTG: Pt will be 100% continent and independent with bladder elim at [place] (Progressing)       Start: 01/03/22   Expected End: 01/29/22         Goal Note       Foley indwelling on admission. Pt had trial earlier in the day 3/13 with foley removal but unable to spontaneously void and foley replaced prior to transfer to AR.                 Cardiac Function       LTG: Pt will be independent with BP and pulse management and medication at [place] (Progressing)       Start: 01/03/22   Expected End: 01/29/22         Goal Note  On medication to manage pulse and BP. Tachy 110-118 at times.                 Compromised Tissue integrity       Damaged tissue is healing and protected       Start: 01/04/22       Interventions:  1. Monitor/assess Braden scale every shift  2. Provide wound care per wound care algorithm  3. Reposition patient every 2 hours and as needed unless able to reposition self  4. Increase activity as tolerated/progressive mobility  5. Relieve pressure to bony prominences for patients at moderate and high risk  6. Avoid shearing injuries   7. Keep intact skin clean and dry  8. Use bath wipes, not soap and water, for daily bathing   9. Use incontinence wipes for cleaning urine, stool and caustic drainage; Foley care as needed   10. Monitor external devices/tubes for correct placement to prevent pressure, friction and shearing   11. Encourage use of lotion/moisturizer on skin  12. Monitor patient's hygiene practices  13. Consult/collaborate with wound care nurse   14. Utilize specialty bed  15. Consider placing an indwelling catheter if incontinence interferes with healing of stage 3 or 4  pressure injury         Nutritional status is improving       Start: 01/04/22       Interventions:  1. Assist patient with eating   2. Allow adequate time for meals   3. Encourage patient to take dietary supplement(s) as ordered   4. Collaborate with Clinical Nutritionist  5. Include patient/patient care companion in decisions related to nutrition            Pain Management       LTG: Pt will achieve good pain control with current medication regimen as evidenced by ability to participate in 100% of therapy sessions. (Progressing)       Start: 01/03/22   Expected End: 01/29/22         Goal Note       Reports pain at "6" on admission. On lidoderm patch and prn dilaudid to manage.                 Safety Risk       LTG: Pt will recognize limitations and use call bell 100% of the time prior to transfers in order to prevent fsalls (Progressing)       Start: 01/03/22   Expected End: 01/29/22         Goal Note       Pt is at the yellow level for falls risk. She is able to return demo of call bell and reports she will comply with fall protocol.                 Skin Wound Management       LTG: Excoriation at buttocks will resolve by [place] (Progressing)       Start: 01/03/22   Expected End: 01/29/22         Goal Note       Excoriation at buttocks. No open areas. Mepilex applied. Specialty bed to promote skin integrity ordered but not yet arrived on the unit.                     Rehab Team Discussion        Self Care Functional Status:   Admission Assessment Current Status Discharge  Goal  Functional Area: Care Score:  Care Score:    Eating 5 5 (01/03/22 2300)     Oral Hygiene         Toileting Hygiene 7 7 (01/03/22 2300)     Shower/Bathe Self         Upper Body Dressing         Lower Body Dressing         Putting On/Taking Off Footwear             Mobility Functional Status:   Admission Assessment Current   Status  Discharge   Goal   Functional Area: Care Score:  Care Score:    Roll Left and Right 4 4 (01/03/22 2300)     Sit to  Lying 4 4 (01/03/22 2300)     Lying to Sitting on Side of Bed 3 3 (01/03/22 2300)     Sit to Stand 3 3 (01/03/22 2300)     Chair/Bed-to-Chair Transfer 3 3 (01/03/22 2300)     Toilet Transfer 7 7 (01/03/22 2300)     Car Transfer         Walk 10 Feet         Walk 50 Feet with Two Turns         Walk 150 Feet         Walking 10 Feet on Uneven Surfaces         1 Step (Curb)         4 Steps         12 Steps         Wheel 50 Feet with Two Turns         Wheel 150 Feet         Picking Up Object

## 2022-01-04 NOTE — Progress Notes (Signed)
Case Management  Inpatient Rehabilitation Progress Note  Initial assessment completed by Forde DandyNaomi Bodhi Stenglein BSN, RN, CRRN on 01/04/22    Lives in the basement apartment of a 3-level house with her daughter, son-in-law, 3 grandchildren [phone](14,17,20) and infant great -grandchild.  3 STE 0 HR, to access basement 2 FOS BIHR.  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  Does not drive, needs assistance with bathing and dressing.  DME: Shower chair, single point cane, front wheel walker in the community.    Support: Daughter Maureen ChattersKira Grier [place](POA) [phone], [phone] works from home, will be available for family training, discharge, and to drive to appointments.  PCP: Cynda Acresaenell [phone]  Pharmacy: [id number]  [id number], [place].     01/04/22 1511   Healthcare Decisions   Interviewed: Patient   Orientation/Decision Making Abilities of Patient Alert and Oriented x3, able to make decisions   Advance Directive Patient has advance directive, copy not in chart   Advance Directive not in Chart Copy requested from family/decision maker   Healthcare Agent Appointed Yes   Prior to admission   Prior level of function Needs assistance with ADLs;Ambulates with assistive device   Type of Residence Private residence   Home Layout Two level;Performs ADL's on one level;Stairs to enter without rails (add number in comment);Other (Comment)  (Stays in the fully equiped  basement, 2 FOS BIHR)   Living Arrangements Children;Family members;Other (Comment)  (Lives with daughter, son -in -law, 3 grandchildren [phone](14,17,20), 1 great grandchild (infant).)   How do you get to your MD appointments? Daughter drives   How do you get your groceries? Daughter drives   Who fixes your meals? Pt. asssits daughter   Who does your laundry? Daughter   Who picks up your prescriptions? Daughter   Dressing Needs assistance   Grooming Independent   Feeding Independent   Bathing Needs assistance   Toileting Independent   DME Currently at Home  ADL- Shower Chair;Walker, Front [place];Cane, [place]   Prior SNF admission? (Detail) N/A   Prior Rehab admission? (Detail) N/A   Discharge Planning   Support Systems Children;Family members   Patient expects to be discharged to: Daughters home   Potential barriers to discharge: Decreased mobility   Mode of transportation: Private car (family member)   Does the patient have perscription coverage? Yes   Financial Resource Strain   How hard is it for you to pay for the very basics like food, housing, medical care, and heating? Not very   Housing Stability   In the last 12 months, was there a time when you were not able to pay the mortgage or rent on time? N   In the last 12 months, how many places have you lived? 1   In the last 12 months, was there a time when you did not have a steady place to sleep or slept in a shelter (including now)? N   Transportation Needs   In the past 12 months, has lack of transportation kept you from medical appointments or from getting medications? no   Consults/Providers   PT Evaluation Needed 1   OT Evalulation Needed 1   SLP Evaluation Needed 1  (Inconsistencies regarding home environmen and history given)   Correct PCP listed in Epic? Yes   Family and PCP   PCP on file was verified as the current PCP? Yes   In case you are admitted, transferred or discharged, would like family notified? Yes   Name of family member to be  notified Daughter Maureen Chatters [phone]   In case you are admitted, transferred or discharged, would like your PCP notified? Yes

## 2022-01-04 NOTE — OT Progress Note (Signed)
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   Patient/Caregiver Goals: I want to be independent  Pain Assessment:  Pain Assessment: No/denies pain    Objective   Vitals:  Heart Rate: (!) 112  BP: 115/75  BP Method: Automatic  MAP (mmHg): 88  Patient Position: Lying    Interventions:     Pt received sitting upright in bed, agreeable to missed minutes session. Appreciate handoff from nsg.     Pt agreeable to education session. Pt introduced to pt binder, as well as therapy team. Pt educated in inpatient rehab procedures, scheduling, and discharge planning, as well as possible equipment recommendations, and insurance processes related to ordering equipment. Pt receptive to education, reporting no further questions at this time.     Pt left lying in bed with all needs met. Handoff to nsg completed.         Education Documentation  Rehab techniques/procedure, taught by Charlton Amor, OT at 01/04/2022  3:51 PM.  Learner: Patient  Readiness: Acceptance  Method: Explanation  Response: Verbalizes Understanding    Plan of care, taught by Charlton Amor, OT at 01/04/2022  3:51 PM.  Learner: Patient  Readiness: Acceptance  Method: Explanation  Response: Verbalizes Understanding    Education Comments  No comments found.        Assessment & Plan   Pt would benefit on follow up on education provided this session, as well as  incorporation of pt binder during therapy sessions. Continue POC per primary OT.     Plan:       Recommendation:

## 2022-01-05 LAB — CBC AND DIFFERENTIAL
Absolute NRBC: 0 10*3/uL (ref 0.00–0.00)
Basophils Absolute Automated: 0.04 10*3/uL (ref 0.00–0.08)
Basophils Automated: 0.4 %
Eosinophils Absolute Automated: 0.24 10*3/uL (ref 0.00–0.44)
Eosinophils Automated: 2.2 %
Hematocrit: 29.7 % — ABNORMAL LOW (ref 34.7–43.7)
Hgb: 9.3 g/dL — ABNORMAL LOW (ref 11.4–14.8)
Immature Granulocytes Absolute: 0.04 10*3/uL (ref 0.00–0.07)
Immature Granulocytes: 0.4 %
Instrument Absolute Neutrophil Count: 7.02 10*3/uL — ABNORMAL HIGH (ref 1.10–6.33)
Lymphocytes Absolute Automated: 2.69 10*3/uL (ref 0.42–3.22)
Lymphocytes Automated: 24.3 %
MCH: 25.5 pg (ref 25.1–33.5)
MCHC: 31.3 g/dL — ABNORMAL LOW (ref 31.5–35.8)
MCV: 81.4 fL (ref 78.0–96.0)
MPV: 9.5 fL (ref 8.9–12.5)
Monocytes Absolute Automated: 1.04 10*3/uL — ABNORMAL HIGH (ref 0.21–0.85)
Monocytes: 9.4 %
Neutrophils Absolute: 7.02 10*3/uL — ABNORMAL HIGH (ref 1.10–6.33)
Neutrophils: 63.3 %
Nucleated RBC: 0 /100 WBC (ref 0.0–0.0)
Platelets: 330 10*3/uL (ref 142–346)
RBC: 3.65 10*6/uL — ABNORMAL LOW (ref 3.90–5.10)
RDW: 17 % — ABNORMAL HIGH (ref 11–15)
WBC: 11.07 10*3/uL — ABNORMAL HIGH (ref 3.10–9.50)

## 2022-01-05 LAB — COMPREHENSIVE METABOLIC PANEL
ALT: 34 U/L (ref 0–55)
AST (SGOT): 23 U/L (ref 5–41)
Albumin/Globulin Ratio: 0.9 (ref 0.9–2.2)
Albumin: 3.2 g/dL — ABNORMAL LOW (ref 3.5–5.0)
Alkaline Phosphatase: 65 U/L (ref 37–117)
Anion Gap: 13 (ref 5.0–15.0)
BUN: 10 mg/dL (ref 7.0–21.0)
Bilirubin, Total: 0.4 mg/dL (ref 0.2–1.2)
CO2: 13 mEq/L — ABNORMAL LOW (ref 17–29)
Calcium: 9.2 mg/dL (ref 8.5–10.5)
Chloride: 104 mEq/L (ref 99–111)
Creatinine: 0.6 mg/dL (ref 0.4–1.0)
Globulin: 3.4 g/dL (ref 2.0–3.6)
Glucose: 107 mg/dL — ABNORMAL HIGH (ref 70–100)
Potassium: 4.3 mEq/L (ref 3.5–5.3)
Protein, Total: 6.6 g/dL (ref 6.0–8.3)
Sodium: 130 mEq/L — ABNORMAL LOW (ref 135–145)

## 2022-01-05 LAB — PHOSPHORUS: Phosphorus: 4.2 mg/dL (ref 2.3–4.7)

## 2022-01-05 LAB — GANGLIOSIDE AB PANEL, S
IgG Asialo. GM1: NEGATIVE
IgG Disialo. GD1b: NEGATIVE
IgG Monos. GM1: NEGATIVE
IgM Asialo. GM1: NEGATIVE
IgM Disialo. GD1b: NEGATIVE
IgM Monos. GM1: NEGATIVE

## 2022-01-05 LAB — MYASTHENIA GRAVIS PANEL WITH REFLEX TO MUSK ANTIBODY: ACH Receptor Binding AB: 0 nmol/L (ref <=0.02)

## 2022-01-05 LAB — MUSK ANTIBODY: MuSK Autoantibody: 0 nmol/L (ref 0.00–0.02)

## 2022-01-05 LAB — MRSA CULTURE
Culture MRSA Surveillance: NEGATIVE
Culture MRSA Surveillance: NEGATIVE

## 2022-01-05 LAB — MAGNESIUM: Magnesium: 2 mg/dL (ref 1.6–2.6)

## 2022-01-05 LAB — GFR: EGFR: >60

## 2022-01-05 MED ORDER — MIRTAZAPINE 15 MG PO TABS
7.5000 mg | ORAL_TABLET | Freq: Every evening | ORAL | Status: DC
Start: 2022-01-05 — End: 2022-01-07
  Administered 2022-01-05 – 2022-01-06 (×2): 7.5 mg via ORAL
  Filled 2022-01-05 (×3): qty 1

## 2022-01-05 MED ORDER — QUETIAPINE FUMARATE 100 MG PO TABS
100.0000 mg | ORAL_TABLET | Freq: Every evening | ORAL | Status: DC
Start: 2022-01-05 — End: 2022-01-26
  Administered 2022-01-05 – 2022-01-25 (×21): 100 mg via ORAL
  Filled 2022-01-05 (×22): qty 1

## 2022-01-05 NOTE — Progress Notes (Signed)
PHYSICAL MEDICINE AND REHABILITATION  PROGRESS NOTE  FACE TO [name]    Date Time: 01/05/22 2:56 PM    Patient Name: [name],[name]  Admission date:  01/03/2022  (LOS: 2 days)    Subjective:     Patient seen with friend at side   Discussed with pharmacy to clarify some meds, patient on latuda which is not form  Will add seroquel which she has taken   Will start remeron for sleep and appetite  BP stable today     Functional Status:     Patient very receptive to education on compensatory methods for dressing but required lots of rest breaks due to fatigue. Her mood improved following OOB therapy and wash-up at sink, however she remains reasonably anxious for any standing tasks due to prior experience of passing out with OT yesterday. She demonstrated good ability to stand using BUE on wheelchair arm rests and would benefit from continued practice along with deep breathing exercises to decrease anxiety. Continue OT POC.     Medications:   Medication reviewed by me:     Scheduled Meds: PRN Meds:    apixaban, 5 mg, Oral, Q12H SCH  budesonide-formoterol, 2 puff, Inhalation, BID  lidocaine, 1 patch, Transdermal, Q24H  metoprolol succinate, 100 mg, Oral, QHS  mirtazapine, 7.5 mg, Oral, QHS  nystatin, 500,000 Units, Oral, QID  pantoprazole, 40 mg, Oral, QAM AC  potassium chloride, 40 mEq, Oral, Daily  QUEtiapine, 100 mg, Oral, QHS  rOPINIRole, 1 mg, Oral, QHS  senna-docusate, 2 tablet, Oral, QHS  thiamine, 100 mg, Oral, Daily  vitamin B-6, 50 mg, Oral, Daily  vitamin D, 25 mcg, Oral, Daily  vitamins/minerals, 1 tablet, Oral, Daily        Continuous Infusions:   acetaminophen, 650 mg, Q4H PRN  bisacodyl, 10 mg, Daily PRN  diphenhydrAMINE, 25 mg, Q6H PRN  gabapentin, 100 mg, TID PRN  HYDROmorphone, 2 mg, Q6H PRN  lactulose, 20 g, Q4H PRN  melatonin, 3 mg, QHS PRN  ondansetron, 4 mg, Q6H PRN          Medication Review  A complete drug regimen review was completed: Yes  Were drug issues were found during review: No   If yes  to drug issues found during review:  What was the issue?:   What was the time the issue was identified?:   Was I contacted and action was taken by midnight of the next calendar day once issue was identified?: N/A   Person who contacted me:   Action taken:     Review of Systems:   A comprehensive review of systems was: No fevers, chills, nausea, vomiting, chest pain, shortness of breath, cough, headache, double vision.  All others negative.    Labs:     Recent Labs   Lab 01/05/22  0654 01/03/22  0312 01/01/22  0353 12/31/21  0356   WBC 11.07* 8.27 5.27 7.67   Hgb 9.3* 8.6* 8.1* 8.1*   Hematocrit 29.7* 26.6* 26.2* 26.2*   Platelets 330 418* 369* 370*      Recent Labs   Lab 01/05/22  0654 01/03/22  0312 01/01/22  0353 12/31/21  0356   Sodium 130* 132* 133* 134*   Potassium 4.3 3.8 4.3 3.8   Chloride 104 101 100 101   CO2 13* 20 18 21    BUN 10.0 12.0 9.0 6.0*   Creatinine 0.6 0.6 0.6 0.6   Calcium 9.2 9.1 9.2 9.3   Albumin 3.2*  --   --  2.9*   Protein, Total 6.6  --   --  6.5   Bilirubin, Total 0.4  --   --  0.3   Alkaline Phosphatase 65  --   --  60   ALT 34  --   --  32   AST (SGOT) 23  --   --  20   Glucose 107* 96 111* 86   Magnesium 2.0  --   --   --    Phosphorus 4.2  --   --   --          Rads:   Radiological Procedure reviewed.  Radiology Results (24 Hour)       ** No results found for the last 24 hours. **          Physical Exam:     Vitals:    01/05/22 0905 01/05/22 0930 01/05/22 0955 01/05/22 1119   BP:  105/58 (!) 143/94 136/89   Pulse:  (!) 126  (!) 119   Resp:       Temp:       TempSrc:       SpO2:       Weight:       Height: 1.651 m (5\' 5" )          Intake and Output Summary (Last 24 hours) at Date Time    Intake/Output Summary (Last 24 hours) at 01/05/2022 1456  Last data filed at 01/05/2022 1244  Gross per 24 hour   Intake 1200 ml   Output 1100 ml   Net 100 ml     P.O.: 240 mL (01/05/22 1244)     Urine: 0 mL (01/05/22 0723)             Gen: Alert, NAD  Psych: Pleasant and cooperative  Skin: Pink, warm,  no rashes  HEENT: Atraumatic, normocephalic,   Chest: symmetric bilateral chest rise and fall,   CV: Regular rate and rhythm, no murmurs  Resp: breath sounds equal throughout; non-labored breathing; no wheezes  Abd: non-distended, soft, non-tender, normal abdominal sounds  Extr: Pink, warm,  Neuro:  No aphasia, fluent speech     Assessment and Plan:     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   Occupational Therapy focused on improving strength, endurance, transfers, activities of daily living, and cognition for 60-120 mins/day, 5-6 days/week, for 14 days.     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%   Metoprolol,   losartan held due to hypotension     # COPD  Symbicort      # bipolar disorder  Latuda at home  Seroquel in hospital  Remeron for sleep and appetite     # Pain Management   Daily monitoring   gabapentin  Acetaminophen, dilaudid PRN     #Bowel Management  constipation - Senna, Colace, Bisacodyl suppository PRN; hold for loose stools    #Bladder Management  Check PVRs    #Skin Management  Frequent skin assessment  Moisture barrier PRN    #  FEN/GI with severe malnutrition   cardiac diet    #DVT PPx  eliquis    #Dispo  Pending rehab progress      Continue comprehensive and intensive inpatient rehab program, including:   Physical therapy 60-120 min daily, 5-6 times per week, Occupational therapy 60-120 min daily, 5-6 times per week, Case management and Rehabilitation nursing.  Psychology services to evaluate and treat as needed      Signed by: Cheryll Dessertarlos R Perez Gonzalez, MD   01/05/2022, 2:56 PM    [facility]

## 2022-01-05 NOTE — Plan of Care (Signed)
Problem: Pain Management  Goal: LTG: Pt will achieve good pain control with current medication regimen as evidenced by ability to participate in 100% of therapy sessions.  Outcome: Progressing  Note: Patient complained of discomfort in back. Lidocaine patch applied. Will continue to assess patients' pain level.      Problem: Safety Risk  Goal: LTG: Pt will recognize limitations and use call bell 100% of the time prior to transfers in order to prevent fsalls  Outcome: Progressing  Note: Patient uses call light appropriately when assistance is needed to help prevent falls.

## 2022-01-05 NOTE — OT Progress Note (Signed)
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 BP monitored throughout, see above for values.     She completed lateral scoots from EOB to wheelchair with min A. She completed bathing at sink side at wheelchair level with external environmental support and VC for use of compensatory strategies.     She was able to complete bathing with VC for use of tailor sit technique to wash BLE. She donned shirt with set-up A. With cues, patient used tailor sit method to thread BLE into pants with assistance to manage foley. Patient used grabbars to stand with max A where OT assisted with posterior pericare, however she was unable to sustain stand for >10 seconds reporting lightheadedness. BP WNL, and patient endorsing that she was highly anxious during stand due to passing out in previous OT session. Instead, patient used BUE to push herself up to a stand x2 with min A and OT assisted with advancing pants over hips. She donned socks via tailor sit method with in A for LLE d/t fatigue.     She completed oral care  seated at sink with SPV. Donned BLE TED hose with total A for maximal safety    Education Documentation  Safety issues and interventions, taught by Davonna Belling, OT at 01/05/2022 10:03 AM.  Learner: Patient  Readiness: Acceptance  Method: Explanation, Teach Back, Demonstration  Response: Demonstrated Understanding, Needs Reinforcement, Verbalizes Understanding, Able to Teach Back    Rehab techniques/procedure, taught by Davonna Belling, OT at 01/05/2022 10:03 AM.  Learner: Patient  Readiness: Acceptance  Method: Explanation, Teach Back, Demonstration  Response: Demonstrated Understanding, Needs Reinforcement, Verbalizes Understanding, Able to Teach Back    Precautions, taught by Davonna Belling, OT at 01/05/2022 10:03 AM.  Learner: Patient  Readiness: Acceptance  Method: Explanation, Teach Back, Demonstration  Response: Demonstrated Understanding, Needs Reinforcement, Verbalizes Understanding, Able to Teach Back    Plan of care, taught by Davonna Belling, OT at 01/05/2022 10:03 AM.  Learner: Patient  Readiness: Acceptance  Method: Explanation, Teach Back, Demonstration  Response: Demonstrated Understanding, Needs Reinforcement, Verbalizes Understanding, Able to Teach Back    Functional transfers/mobility, taught by Davonna Belling, OT at 01/05/2022 10:03 AM.  Learner: Patient  Readiness: Acceptance  Method: Explanation, Teach Back, Demonstration  Response: Demonstrated Understanding, Needs Reinforcement, Verbalizes Understanding, Able to Teach Back    Fall prevention/balance training, taught by Davonna Belling, OT at  01/05/2022 10:03 AM.  Learner: Patient  Readiness: Acceptance  Method: Explanation, Teach Back, Demonstration  Response: Demonstrated Understanding, Needs Reinforcement, Verbalizes Understanding, Able to Teach Back    ADL retraining, taught by Davonna Belling, OT at 01/05/2022 10:03 AM.  Learner: Patient  Readiness: Acceptance  Method: Explanation, Teach Back, Demonstration  Response: Demonstrated  Understanding, Needs Reinforcement, Verbalizes Understanding, Able to Teach Back    Education Comments  No comments found.      Assessment & Plan   Patient very receptive to education on compensatory methods for dressing but required lots of rest breaks due to fatigue. Her mood improved following OOB therapy and wash-up at sink, however she remains reasonably anxious for any standing tasks due to prior experience of passing out with OT yesterday. She demonstrated good ability to stand using BUE on wheelchair arm rests and would benefit from continued practice along with deep breathing exercises to decrease anxiety. Continue OT POC.     Plan:  Risks/Benefits/POC Discussed with Pt/Family: With patient  Treatment Interventions: ADL retraining, Functional transfer training, UE strengthening/ROM, Endurance training, Cognitive reorientation, Patient/Family training, Equipment eval/education, Neuro muscular reeducation, Fine motor coordination activities, Compensatory technique education    Recommendation:  Discharge Recommendation: Home with home health OT, Home with home health PT  OT Therapy Recommendation: 5-6 days/week, 60-120 mins/day, 1:1 treatment, Group therapy  OT Estimated Length of Stay: 14 days

## 2022-01-05 NOTE — Malnutrition Assessment (Signed)
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 female patient.   [id number]    Malnutrition Assessment   Malnutrition Documentation  Severe malnutrition related to inadequate protein-energy intake with increased protein-energy needs in the context of acute illness as evidenced by moderate fat loss ( orbital and buccal fat pads); moderate muscle loss ( temporalis, pectoralis,deltoid, interosseous, quadriceps); 20% weight loss in 10 days    Lurline Hare MS. RD [id number]   01/05/2022 @ 8119    If physician disagrees with this assessment see addendum.

## 2022-01-05 NOTE — Plan of Care (Signed)
Problem: Safety Risk  Goal: LTG: Pt will recognize limitations and use call bell 100% of the time prior to transfers in order to prevent fsalls  Outcome: Progressing   Calls for help with transfers and care needs.  Problem: Pain Management  Goal: LTG: Pt will achieve good pain control with current medication regimen as evidenced by ability to participate in 100% of therapy sessions.  Outcome: Progressing   Patient reports  effective pain control with scheduled pain medication.denies pain at this time  Problem: Cardiac Function  Goal: LTG: Pt will be independent with BP and pulse management and medication at [place]  Outcome: Progressing   Teaching on s/s of hypotension and importance of abdominal binder provided. Patient verbalized understanding

## 2022-01-05 NOTE — PT Progress Note (Signed)
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 Patient initially presenting with anxiety and complaints of dizziness and nausea. Patient's BP remained WNL throughout session (see flowsheet). Patient was heavily educated that her BP was WNL, and she was safe to mobilize. Patient also educated on safety measures that the therapists take to increase BP including donning TED hose, ACE wraps, abdominal binder, exercise, and having the patient drink water as patient expressed feeling nervous to mobilize due to a previous syncopal episode. Patient agreed to begin supine TherEx that included lower trunk rotations, single knee to chest with Min A x 1 and quad sets.     Patient then agreed to transfer to the EOB for seated TherEx. Patient transferred to the EOB by using bed controls to elevate HOB, otherwise, this was completed with Supervision. When on EOB, patient completed LAQ, RTB clamshells, heel raises and RTB shoulder flexion/abduction. Patient then completed 1 x 30 second sit>stand and 2 x  15 second sit>stand with the RW and CGA. Patient initially stands with bilateral genu recurvatum, so she was educated to soften her knees. Patient's HR was noted to be high throughout session (see flowsheet), and RN notified of this. Patient transferred back to semi-reclined with Supervision. Education given on the importance to build tolerance to a more upright position in bed so that her BP will not have sudden drops upon standing. Patient states her nausea and dizziness subsided throughout session.         Education Documentation  Safety issues and interventions, taught by Marquis Buggy at 01/05/2022  4:41 PM.  Learner: Patient  Readiness: Acceptance  Method: Explanation  Response: Verbalizes Understanding    Rehab techniques/procedure, taught by Marquis Buggy at 01/05/2022  4:41 PM.  Learner: Patient  Readiness: Acceptance  Method: Explanation  Response: Verbalizes Understanding    Precautions, taught by Marquis Buggy at 01/05/2022  4:41 PM.  Learner: Patient  Readiness: Acceptance  Method: Explanation  Response: Verbalizes Understanding    Plan of care, taught by Marquis Buggy at 01/05/2022  4:41 PM.  Learner: Patient  Readiness: Acceptance  Method: Explanation  Response: Verbalizes Understanding    Pain management, taught by Marquis Buggy at 01/05/2022  4:41 PM.  Learner: Patient  Readiness: Acceptance  Method: Explanation  Response: Verbalizes Understanding    Functional transfers/mobility, taught by Marquis Buggy at 01/05/2022  4:41 PM.  Learner: Patient  Readiness: Acceptance  Method: Explanation  Response:  Verbalizes Understanding    Fall prevention/balance training, taught by Marquis Buggy at 01/05/2022  4:41 PM.  Learner: Patient  Readiness: Acceptance  Method: Explanation  Response: [name] Understanding    Equipment, taught by Marquis Buggy at 01/05/2022  4:41 PM.  Learner: Patient  Readiness: Acceptance  Method: Explanation  Response: Verbalizes Understanding    Cognitive function, taught  by Marquis Buggy at 01/05/2022  4:41 PM.  Learner: Patient  Readiness: Acceptance  Method: Explanation  Response: Verbalizes Understanding    Bowel and bladder management, taught by Marquis Buggy at 01/05/2022  4:41 PM.  Learner: Patient  Readiness: Acceptance  Method: Explanation  Response: Verbalizes Understanding    ADL retraining, taught by Marquis Buggy at 01/05/2022  4:41 PM.  Learner: Patient  Readiness: Acceptance  Method: Explanation  Response: Verbalizes Understanding    Education Comments  No comments found.         Assessment & Plan   Patient presenting with anxiety and high HR throughout session today. Extensive education given on BP management and safety precautions maintained throughout therapy sessions. Patient completed supine and seated TherEx. She also completed 3 sit> prolonged stands with a RW and CGA. Plan to implement more weightbearing activity as tolerance allows. Patient to benefit from skilled PT to decrease BOC upon [place].    Plan:  Risks/Benefits/POC Discussed with Pt/Family: With patient  Treatment/Interventions: Gait training, Exercise, Stair training, Neuromuscular re-education, Functional transfer training, LE strengthening/ROM, Endurance training, Patient/family training, Equipment eval/education, Bed mobility, Compensatory technique education, Continued evaluation    Recommendation:  Discharge Recommendation: Home with home health PT  PT Therapy Recommendation: 5-6 days/week, 60-120 mins/day, 1:1 treatment  PT Estimated Length of Stay: 10-14 days from IE on 01/04/2022

## 2022-01-05 NOTE — Progress Notes (Addendum)
Nutrition Support Services  [facility]   Main Office Telephone: [phone]    Nutrition Assessment    [name] 66 y.o. female   MRN: [id number]    Summary of Nutrition Recommendations:  RN documents bed scale weekly weight  Add glucerna shake TID  If po intake continues to decline, MD considers an appetite stimulant    -----------------------------------------------------------------------------------------------------------------    Assessment Data   Reason for Assessment: MST=3; recent weight loss and decreased appetite    Subjective Nutrition:  I ate 30% of the breakfast. I have diabetes.    Learning Needs:  None    Events of Current Admission:  PMH and PSH below. 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 Pt has a hx of not eating well. She appears wasted. NFPE shows fat and muscle loss. Bed scale weights show 20% weight loss in 10 days.    Medical Hx:  has a past medical history of Anemia, Asthma, Atrial fibrillation, Chronic obstructive pulmonary disease, Convulsions, Depression, Diabetes mellitus, Hyperlipidemia, Hypertension, and TIA (transient ischemic attack).    PSH: has a past surgical history that includes Hysterectomy; pci; Cardiac catheterization (2017); and Triple Lumen Dialysis Cath (N/A, 12/28/2021).     Wt Readings from Last 30 Encounters:   01/03/22 60.8 kg (134 lb 1.6 oz)   12/26/21 72.6 kg (160 lb)   12/24/21 76.2 kg (168 lb)   12/24/21 73.5 kg (162 lb)   09/09/20 95.3 kg (210 lb)   07/07/20 136.1 kg (300 lb)   06/23/19 104 kg (229 lb 4.5 oz)   06/16/19 99.3 kg (219 lb)   06/16/19 101.9 kg (224 lb 10.4 oz)   05/21/19 96.6 kg (212 lb 14.4 oz)   05/20/19 101.2 kg (223 lb 1.7 oz)   05/11/19 100.4 kg (221 lb 4.8 oz)   01/06/19 98.2 kg (216 lb 6.4 oz)   08/26/18 96.8 kg (213 lb 8 oz)   08/22/18 96.2 kg (212 lb)       Weight Hx Summary: Bed scale weight ( 12/24/2021 and 01/03/2022) shows 20% weight loss in 10 days.    ANTHROPOMETRIC  Height: 165.1 cm (5\' 5" )  Weight: 60.8 kg (134 lb 1.6 oz)      Body mass index is 22.32 kg/m.     Social History     Socioeconomic History    Marital status: Divorced   Tobacco Use    Smoking status: Never    Smokeless tobacco: Never   Vaping Use    Vaping Use: Never used   Substance and Sexual Activity    Alcohol use: Never    Drug use: Never   Other Topics Concern    Acutrim No    Byetta No    Contrave No    Dexatrim No    Diethylpropion No    Fastin No    Fen - Phen No    Ionamin / Adipex No    Phentermine No    Qsymia No    Prozac No    Saxenda No    Topamax No    Wellbutrin No    Xenical (Orlistat, Alli) No    Other Med No     Social Determinants of Psychologist, prison and probation services Strain: Low Risk     Difficulty of Paying Living Expenses: Not very hard   Food Insecurity: No Food Insecurity    Worried About Running Out of Food in the Last Year: Never true    Barista in  the Last Year: Never true   Transportation Needs: No Transportation Needs    Lack of Transportation (Medical): No    Lack of Transportation (Non-Medical): No   Physical Activity: Insufficiently Active    Days of Exercise per Week: 2 days    Minutes of Exercise per Session: 20 min   Stress: Stress Concern Present    Feeling of Stress : To some extent   Social Connections: Moderately Integrated    Frequency of Communication with Friends and Family: More than three times a week    Frequency of Social Gatherings with Friends and Family: Twice a week    Attends Religious Services: More than 4 times per year    Active Member of [employer] or Organizations: Yes    Attends Engineer, structural: More than 4 times per year    Marital Status: Divorced   Catering manager Violence: Not At Risk    Fear of Current or Ex-Partner: No    Emotionally Abused: No    Physically Abused: No    Sexually Abused: No   Housing Stability: Low Risk     Unable to Pay for Housing in the Last Year: No    Number of Places Lived in the Last Year: 1    Unstable Housing in the Last Year: No       Active Hospital Problems     Diagnosis    Myositis     Allergies   Allergen Reactions    Singulair [Montelukast] Itching    Immune Globulin (Human) Itching and Facial Swelling     Pt complain of itchiness and swelling of lips and tongue. Saturation fine, bp elevated.    Myoview [Technetium-13m] Itching    Contrast [Iodinated Contrast Media]     Latex     Nitroglycerin     Penicillins     Tetracyclines & Related        GI Symptoms: Poor appetite  Skin: Stage 1 pressure coccyx  NFPE:  Head: temple region: slight depression with decrease in muscle tone/resistance (moderate muscle loss - temporalis), orbital region: slightly dark circles, somewhat hollow look, some decrease in bounce back of fat pads (moderate fat loss), and buccal region: slight depression, somewhat sunken appearance, flat cheeks, decrease in bounce back of fat pads (moderate fat loss)  Upper Body: clavicle bone region: some protrusion of the clavicle with decrease in muscle tone/resistance (moderate muscle loss - pectoralis major), shoulder and acromion bone region: slight protrusion of acromion process, decrease in muscle tone/resistance (moderate muscle loss - deltoid), upper arm region: some fat in pinch between fingers, but not ample (mild fat loss), dorsal hand region: slight depression, decrease in muscle tone/resistance (moderate muscle loss - interosseous), scapular bone region: slight protrusion of the scapula bone, mild depressions, decrease in muscle tone/resistance around scapula bone (mild muscle loss - trapezius, supraspinatus, infraspinatus), and thoracic/lumbar region: ribs apparent, depressions between them less pronounced, minimal fat in pinch, Iliac Crest somewhat prominent (mild fat loss)  Lower Body: anterior thigh and patellar region: slight depression along inner/outer thigh, patella slightly prominent, feel decrease in muscle tone/resistance in quadriceps to the patella (moderate muscle loss - quadriceps), posterior calf region: some shape to the bulb, but  not well-developed, decrease in muscle tone/resistance (mild muscle loss - gastrocnemius), and edema: mild pitting (1+), indentation lasts 0 to 15 seconds (mild edema -  )    Orders Placed This Encounter   Procedures    Diet consistent carbohydrate    Glucerna Shake (  8 Oz Cans) Quantity: A. One; Frequency: Once With lunch        Current Meds:  apixaban, 5 mg, Q12H SCH  budesonide-formoterol, 2 puff, BID  lidocaine, 1 patch, Q24H  metoprolol succinate, 100 mg, QHS  nortriptyline, 10 mg, QHS  nystatin, 500,000 Units, QID  pantoprazole, 40 mg, QAM AC  NON-FORMULARY PAT OWN MED order form, 120 mg, Daily  potassium chloride, 40 mEq, Daily  rOPINIRole, 1 mg, QHS  senna-docusate, 2 tablet, QHS  thiamine, 100 mg, Daily  vitamin B-6, 50 mg, Daily  vitamin D, 25 mcg, Daily  vitamins/minerals, 1 tablet, Daily          Recent Labs:  Recent Labs   Lab 01/05/22  0654 01/03/22  0312 01/01/22  0353   WBC 11.07* 8.27 5.27   Hgb 9.3* 8.6* 8.1*   Hematocrit 29.7* 26.6* 26.2*   MCV 81.4 78.5 79.9   Platelets 330 418* 369*     Recent Labs   Lab 01/05/22  0654 01/03/22  0312 01/01/22  0353 12/31/21  0356 12/30/21  1806   Sodium 130* 132* 133* 134* 134*   Potassium 4.3 3.8 4.3 3.8 3.9   Chloride 104 101 100 101 101   CO2 13* 20 18 21 23    BUN 10.0 12.0 9.0 6.0* 6.0*   Creatinine 0.6 0.6 0.6 0.6 0.6   Glucose 107* 96 111* 86 100   Calcium 9.2 9.1 9.2 9.3 9.1   Magnesium 2.0  --   --   --   --    Phosphorus 4.2  --   --   --   --    EGFR >60.0 >60.0 >60.0 >60.0 >60.0     Recent Labs   Lab 01/05/22  0654 12/31/21  0356   Albumin 3.2* 2.9*       Estimated Nutrition Needs:  Total Daily Energy Needs: 1824 to 2128 kcal  Method for Calculating Energy Needs: 30 kcal - 35 kcal per kg  at 60.8 kg (Actual body weight)  Rationale: Malnutrition     Total Daily Protein Needs: 72.96 to 91.2 g  Method for Calculating Protein Needs: 1.2 g - 1.5 g per kg at 60.8 kg (0 body weight)       Total Daily Fluid Needs: 1824 ml  Method for Calculating Fluid Needs:  ABW x 30 ml  Rationale: General guideline      Nutrition Diagnosis      Inadequate Protein-energy intake related to decreased appetite as evidenced by 30% po intake  Severe malnutrition related to inadequate protein-energy intake with increased protein-energy needs in the context of acute illness as evidenced by moderate fat loss ( orbital and buccal fat pads); moderate muscle loss ( temporalis, pectoralis,deltoid, interosseous, quadriceps); 20% weight loss in 10 days  Unintentional weight loss related to decreased po intake as evidenced by 20% weight loss in 10 days      Intervention      Please refer to top of note for nutrition interventions and recommendations     Monitoring and Evaluation      Goals: Po intake increases to 75% or more to meet estimated needs    Monitor: weight, po intake and labs    Follow up: 1-4 days    Lurline Hare MS. RD [id number]   01/05/2022 @ [id number]

## 2022-01-05 NOTE — PT Progress Note (Signed)
Inpatient Rehabilitation Exception Note    Analyse Angst missed therapy today because of reported increase in fatigue and nausea.     Missed therapy minutes reason: Patient refusal - fatigue  Plan to make up missed minutes: Later today, not scheduled

## 2022-01-06 DIAGNOSIS — E559 Vitamin D deficiency, unspecified: Secondary | ICD-10-CM

## 2022-01-06 DIAGNOSIS — E60 Dietary zinc deficiency: Secondary | ICD-10-CM

## 2022-01-06 DIAGNOSIS — E1169 Type 2 diabetes mellitus with other specified complication: Secondary | ICD-10-CM

## 2022-01-06 DIAGNOSIS — R29898 Other symptoms and signs involving the musculoskeletal system: Secondary | ICD-10-CM

## 2022-01-06 DIAGNOSIS — E531 Pyridoxine deficiency: Secondary | ICD-10-CM

## 2022-01-06 DIAGNOSIS — E782 Mixed hyperlipidemia: Secondary | ICD-10-CM

## 2022-01-06 LAB — BASIC METABOLIC PANEL
Anion Gap: 12 (ref 5.0–15.0)
BUN: 10 mg/dL (ref 7.0–21.0)
CO2: 18 mEq/L (ref 17–29)
Calcium: 9.4 mg/dL (ref 8.5–10.5)
Chloride: 106 mEq/L (ref 99–111)
Creatinine: 0.6 mg/dL (ref 0.4–1.0)
Glucose: 88 mg/dL (ref 70–100)
Potassium: 4.2 mEq/L (ref 3.5–5.3)
Sodium: 136 mEq/L (ref 135–145)

## 2022-01-06 LAB — CBC
Absolute NRBC: 0 10*3/uL (ref 0.00–0.00)
Hematocrit: 28.4 % — ABNORMAL LOW (ref 34.7–43.7)
Hgb: 9.1 g/dL — ABNORMAL LOW (ref 11.4–14.8)
MCH: 25.2 pg (ref 25.1–33.5)
MCHC: 32 g/dL (ref 31.5–35.8)
MCV: 78.7 fL (ref 78.0–96.0)
MPV: 8.7 fL — ABNORMAL LOW (ref 8.9–12.5)
Nucleated RBC: 0 /100 WBC (ref 0.0–0.0)
Platelets: 490 10*3/uL — ABNORMAL HIGH (ref 142–346)
RBC: 3.61 10*6/uL — ABNORMAL LOW (ref 3.90–5.10)
RDW: 17 % — ABNORMAL HIGH (ref 11–15)
WBC: 9.34 10*3/uL (ref 3.10–9.50)

## 2022-01-06 LAB — GFR: EGFR: >60

## 2022-01-06 MED ORDER — ZINC SULFATE 220 (50 ZN) MG PO CAPS
220.0000 mg | ORAL_CAPSULE | Freq: Every day | ORAL | Status: DC
Start: 2022-01-06 — End: 2022-01-09
  Administered 2022-01-06 – 2022-01-09 (×4): 220 mg via ORAL
  Filled 2022-01-06 (×4): qty 1

## 2022-01-06 MED ORDER — ALBUTEROL-IPRATROPIUM 2.5-0.5 (3) MG/3ML IN SOLN
3.0000 mL | Freq: Four times a day (QID) | RESPIRATORY_TRACT | Status: DC | PRN
Start: 2022-01-06 — End: 2022-01-26

## 2022-01-06 MED ORDER — FERROUS SULFATE 324 (65 FE) MG PO TBEC
324.0000 mg | DELAYED_RELEASE_TABLET | Freq: Every morning | ORAL | Status: DC
Start: 2022-01-07 — End: 2022-01-26
  Administered 2022-01-07 – 2022-01-26 (×19): 324 mg via ORAL
  Filled 2022-01-06 (×20): qty 1

## 2022-01-06 NOTE — Consults (Signed)
MEDICINE NEW CONSULT  [facility], DIVISION OF HOSPITALIST MEDICINE    [facility]   [place] Pager: [phone]      Date Time: 01/06/22 5:26 PM  Patient Name: [name]  Requesting Physician: Anastasia Pall, Edson Snowball*  Consulting Physician: Carley Hammed, MD, MD    Primary Care Physician: Malcolm Metro, MD    Reason for Consultation: Myositis,  Cardiomyopathy, COPD, hypertension, diabetes      Assessment:     Active Hospital Problems    Diagnosis    Myositis       66 year old female with past medical history significant for diabetes, hypertension, nonischemic cardiomyopathy, paroxysmal A-fib, anemia, bipolar,, TIAs, patient presented to [facility] hospital on 2/7 with lower extremity weakness, paresthesias, transaminitis thought to have 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  Patient was subsequent discharged on 2/17 will need to go back to the hospital on 2/18 for continued management of rhabdomyolysis.  Patient noted to have left-sided weakness with associated difficulty with walking, standing, also developing bilateral hand and feet paresthesias and hand weakness she subsequent presented [facility] and was seen by neurology, work-up including MRIs, lumbar puncture done with no clear etiology subsequent with transfer to [facility] for consideration for IVIG.  While at [facility] first dose of IVIG was started outpatient setting developed allergic reaction therefore discontinued.  Diagnosis at time of discharge unclear include possible statin induced myositis versus inflammatory myositis versus immune mediated myositis.  She was noted to have multiple vitamin deficiencies including D, zinc, B6, iron deficiency which was treated.  Patient was discharged to acute rehab on 3/14.  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  Extensive work-up nonrevealing  Paraneoplastic antibodies are pending  PT/OT per primary team    Vitamin D, B6, zinc  Continue multivitamin supplement  Continue thiamine, vitamin B6  We will add zinc supplement    Iron deficiency anemia  Add ferrous sulfate  Monitor H&H    Moderate  malnutrition  Weight loss, was on Ozempic but discontinued  Nutrition consult  Currently on Remeron    Urinary tension  Currently has a Foley catheter, failed voiding trial on 3/13   Continue Flomax  Consider another voiding trial early next week    COPD  Not in exacerbation   continue Symbicort  We will add as needed nebs    Paroxysmal A-fib  Continue metoprolol XL 100 mg  Continue Eliquis    History of nonischemic cardiopathy  EF 43%, mild global hypokinesis  We will ultimately need outpatient work-up for ischemia once weakness improves    Bipolar disorder  Continue home meds including Seroquel, ropinirole    Diabetes  Hemoglobin A1c 5.3  Patient had been on Ozempic but stopped due to malnutrition/weight loss  No need for sliding-scale for now, can check as needed        .        Recent Labs     01/06/22  0516 01/05/22  0654   Sodium 136 130*  Diagnosis: Mild Hyponatremia   resolved    Anastasia Pall, Edson Snowball*, thank you for this consultation.  We will follow the patient with you during this hospitalization.  Please contact me with any questions or new issues.      History of Presenting Illness:   [name] is a 66 y.o. female who was hospitalized for unclear etiology for myositis/myopathy.  Medicine consulted for continued medical  comanagement.  Patient seen examined at bedside.  Currently denies any chest pain, no shortness of breath, does note some shortness of breath with exertion and she feels tired.  No associated fevers no chills no sweats.  No abdominal pain no nausea no vomiting.  Continues to have generalized weakness.  Past Medical History:     Past Medical History:   Diagnosis Date    Anemia     Asthma     Atrial fibrillation     Chronic obstructive pulmonary disease     Convulsions     Depression     Diabetes mellitus     diet controlled     Hyperlipidemia     Hypertension     TIA (transient ischemic attack)      additional Hx      Available old records reviewed, including: EPIC      Past Surgical History:     Past Surgical History:   Procedure Laterality Date    CARDIAC CATHETERIZATION  2017    HYSTERECTOMY      [facility]      TRIPLE LUMEN DIALYSIS CATH N/A 12/28/2021    Procedure: TRIPLE LUMEN DIALYSIS CATH;  Surgeon: Tamala Bari, MD;  Location: [facility] CARDIAC CATH;  Service: Interventional Radiology;  Laterality: N/A;       Family History:     Family History   Problem Relation Age of Onset    Stroke Maternal Grandmother    , otherwise reviewed and non contributory     Social History:    reports that she has never smoked. She has never used smokeless tobacco. She reports that she does not drink alcohol and does not use drugs.    Allergies:     Allergies   Allergen Reactions    Singulair [Montelukast] Itching    Immune Globulin (Human) Itching and Facial Swelling     Pt complain of itchiness and swelling of lips and tongue. Saturation fine, bp elevated.    Myoview [Technetium-24m] Itching    Contrast [Iodinated Contrast Media]     Latex     Nitroglycerin     Penicillins     Tetracyclines & Related        Medications:     Medications Prior to Admission   Medication Sig    acetaminophen (TYLENOL) 325 MG tablet Take 2 tablets (650 mg total) by mouth every 4 (four) hours as needed for Pain    albuterol sulfate HFA (PROVENTIL) 108 (90 Base)  MCG/ACT inhaler Inhale 2 puffs into the lungs every 6 (six) hours as needed for Wheezing    apixaban (ELIQUIS) 5 MG Take 1 tablet (5 mg total) by mouth every 12 (twelve) hours    budesonide-formoterol (SYMBICORT) 80-4.5 MCG/ACT inhaler Inhale 2 puffs into the lungs daily    butalbital-acetaminophen-caffeine (FIORICET) 50-325-40 MG per tablet Take 1 tablet by mouth every 6 (six) hours as needed for Headaches    calcium carbonate (TUMS) 500 MG chewable tablet Chew 2 tablets (1,000 mg) by mouth every 6 (six) hours as needed for Heartburn  diphenhydrAMINE (BENADRYL) 25 mg capsule Take 1 capsule (25 mg) by mouth every 6 (six) hours as needed for Itching    ferrous sulfate 324 (65 FE) MG Tablet Delayed Response Take 1 tablet (324 mg) by mouth every other day    HYDROmorphone (DILAUDID) 2 MG tablet Take 1 tablet (2 mg) by mouth every 6 (six) hours as needed for Pain    Latuda 120 MG Tab Take 120 mg by mouth daily    lidocaine (LIDODERM) 5 % Place 1 patch onto the skin every 24 hours Remove & Discard patch within 12 hours or as directed by MD    losartan (COZAAR) 25 MG tablet Take 1 tablet (25 mg) by mouth daily    melatonin 3 mg tablet Take 1 tablet (3 mg) by mouth nightly as needed for Sleep    metoprolol succinate XL (TOPROL-XL) 100 MG 24 hr tablet Take 1 tablet (100 mg) by mouth daily    nortriptyline (PAMELOR) 10 MG capsule Take 1 capsule (10 mg) by mouth nightly    nystatin (MYCOSTATIN) 100000 UNIT/ML suspension Take 5 mLs (500,000 Units) by mouth 4 (four) times daily    ondansetron (ZOFRAN-ODT) 4 MG disintegrating tablet Take 1 tablet (4 mg) by mouth every 6 (six) hours as needed for Nausea    pantoprazole (PROTONIX) 40 MG tablet Take 1 tablet (40 mg total) by mouth daily    QUEtiapine (SEROQUEL) 50 MG tablet Take 1 tablet (50 mg total) by mouth nightly    rOPINIRole (REQUIP) 1 MG tablet Take 1 tablet (1 mg total) by mouth nightly    senna-docusate (PERICOLACE) 8.6-50 MG per tablet Take 2 tablets by mouth 2 (two)  times daily as needed for Constipation    terazosin (HYTRIN) 2 MG capsule Take 1 capsule (2 mg total) by mouth nightly    thiamine (B-1) 100 MG tablet Take 1 tablet (100 mg) by mouth daily    Trintellix 10 MG Tab tablet Take 10 mg by mouth daily    vitamin B-6 (B-6) 50 MG tablet Take 1 tablet (50 mg) by mouth daily    vitamin D (vitamin D3) 25 MCG (1000 UT) tablet Take 1 tablet (25 mcg) by mouth daily    vitamins/minerals Tab Take 1 tablet by mouth daily       Current Facility-Administered Medications   Medication Dose Route Frequency    apixaban  5 mg Oral Q12H SCH    budesonide-formoterol  2 puff Inhalation BID    lidocaine  1 patch Transdermal Q24H    metoprolol succinate  100 mg Oral QHS    mirtazapine  7.5 mg Oral QHS    nystatin  500,000 Units Oral QID    pantoprazole  40 mg Oral QAM AC    potassium chloride  40 mEq Oral Daily    QUEtiapine  100 mg Oral QHS    rOPINIRole  1 mg Oral QHS    senna-docusate  2 tablet Oral QHS    thiamine  100 mg Oral Daily    vitamin B-6  50 mg Oral Daily    vitamin D  25 mcg Oral Daily    vitamins/minerals  1 tablet Oral Daily            Review of Systems:   All other systems were reviewed and are negative except as above in HPI     Physical Exam:   Patient Vitals for the past 24 hrs:   BP Temp Temp src Pulse Resp SpO2  01/06/22 1703 122/72 99.7 F (37.6 C) Oral (Abnormal) 120 17 99 %   01/06/22 1300 103/72 no documentation no documentation (Abnormal) 107 no documentation no documentation   01/06/22 1150 123/82 no documentation no documentation (Abnormal) 114 no documentation no documentation   01/06/22 1135 116/74 no documentation no documentation (Abnormal) 121 no documentation no documentation   01/06/22 1129 130/83 no documentation no documentation (Abnormal) 116 no documentation no documentation   01/06/22 1100 125/75 no documentation no documentation (Abnormal) 107 no documentation no documentation   01/06/22 1000 125/75 no documentation no documentation (Abnormal) 107 no  documentation no documentation   01/06/22 0856 115/75 no documentation no documentation (Abnormal) 120 no documentation 99 %   01/06/22 0854 98/63 no documentation no documentation (Abnormal) 122 no documentation 99 %   01/06/22 0552 123/77 98.4 F (36.9 C) Oral (Abnormal) 101 17 100 %   01/05/22 2054 136/72 98.4 F (36.9 C) Oral (Abnormal) 115 no documentation 100 %     Body mass index is 22.32 kg/m.    Intake/Output Summary (Last 24 hours) at 01/06/2022 1726  Last data filed at 01/06/2022 1703  Gross per 24 hour   Intake 460 ml   Output 600 ml   Net -140 ml       General:  Appears well-developed and well-nourished female, in no acute distress   Eyes: Extraocular movements intact. Conjunctivae are normal. Pupils are equal, round, and reactive to light and accommodation. No scleral icterus.  Head: Normocephalic.   Neck: Neck supple. Normal carotid pulses present. No lymphadenopathy. No thyromegaly.   Cardiovascular: Normal rate, regular rhythm, S1 normal and S2 normal. Exam reveals no gallop and no friction rub.  No murmur heard.  Pulmonary/Chest: Effort normal and breath sounds normal. Clear to auscultation bilaterally. No wheezes, rhonchi or rales.   Abdominal: Soft. Bowel sounds present. No distension. No abdominal tenderness. No hepatosplenomegaly.  Extremities: no clubbing, cyanosis, or edema, .       Peripheral Pulses: Dorsalis pedis and posterior tibial pulses are 2+ bilaterally.   Skin: No rash noted   Neurological: alert, oriented x 3,moves all ext   Psychiatric: Alert and oriented to person, place, and time. Cognitive function is intact.       Labs:     Recent Labs     01/06/22  0516 01/05/22  0654   WBC 9.34 11.07*   Hgb 9.1* 9.3*   Hematocrit 28.4* 29.7*   Platelets 490* 330       Recent Labs     01/06/22  0516 01/05/22  0654   Sodium 136 130*   Potassium 4.2 4.3   Chloride 106 104   CO2 18 13*   BUN 10.0 10.0   Creatinine 0.6 0.6   Glucose 88 107*   Calcium 9.4 9.2   Magnesium  --  2.0   Phosphorus  --   4.2       Recent Labs     01/05/22  0654   AST (SGOT) 23   ALT 34   Alkaline Phosphatase 65   Protein, Total 6.6   Albumin 3.2*   Bilirubin, Total 0.4       No results for input(s): PTT, PT, INR in the last 72 hours.            Imaging personally reviewed, including:   No results found.      Signed by: Carley Hammed, MD, MD    cc: Anastasia Pall, Randall Hiss, MD

## 2022-01-06 NOTE — Progress Notes (Signed)
PHYSICAL MEDICINE AND REHABILITATION  PROGRESS NOTE  FACE TO [name]    Date Time: 01/06/22 3:08 PM    Patient Name: [name],[name]  Admission date:  01/03/2022  (LOS: 3 days)    Subjective:     Patient seen this morning  Reports better sleep last night now that she was seroquel  Internal medicine consulted, awaiting neuro eval     Functional Status:     Pt demo increased tolerance for OOB activity but only for about half of session before requesting to cease activity. Was able to continue to engage in session via deep breathing exercises. She is making slow progress and may need additional time and reminders/further education of anxiety mgmt strategies to continue to persist in challenging OOB activities.Continue per OT POC.    Medications:   Medication reviewed by me:     Scheduled Meds: PRN Meds:    apixaban, 5 mg, Oral, Q12H SCH  budesonide-formoterol, 2 puff, Inhalation, BID  lidocaine, 1 patch, Transdermal, Q24H  metoprolol succinate, 100 mg, Oral, QHS  mirtazapine, 7.5 mg, Oral, QHS  nystatin, 500,000 Units, Oral, QID  pantoprazole, 40 mg, Oral, QAM AC  potassium chloride, 40 mEq, Oral, Daily  QUEtiapine, 100 mg, Oral, QHS  rOPINIRole, 1 mg, Oral, QHS  senna-docusate, 2 tablet, Oral, QHS  thiamine, 100 mg, Oral, Daily  vitamin B-6, 50 mg, Oral, Daily  vitamin D, 25 mcg, Oral, Daily  vitamins/minerals, 1 tablet, Oral, Daily        Continuous Infusions:   acetaminophen, 650 mg, Q4H PRN  bisacodyl, 10 mg, Daily PRN  diphenhydrAMINE, 25 mg, Q6H PRN  gabapentin, 100 mg, TID PRN  HYDROmorphone, 2 mg, Q6H PRN  lactulose, 20 g, Q4H PRN  melatonin, 3 mg, QHS PRN  ondansetron, 4 mg, Q6H PRN          Medication Review  A complete drug regimen review was completed: Yes  Were drug issues were found during review: No   If yes to drug issues found during review:  What was the issue?:   What was the time the issue was identified?:   Was I contacted and action was taken by midnight of the next calendar day once issue was  identified?: N/A   Person who contacted me:   Action taken:     Review of Systems:   A comprehensive review of systems was: No fevers, chills, nausea, vomiting, chest pain, shortness of breath, cough, headache, double vision.  All others negative.    Labs:     Recent Labs   Lab 01/06/22  0516 01/05/22  0654 01/03/22  0312 01/01/22  0353   WBC 9.34 11.07* 8.27 5.27   Hgb 9.1* 9.3* 8.6* 8.1*   Hematocrit 28.4* 29.7* 26.6* 26.2*   Platelets 490* 330 418* 369*      Recent Labs   Lab 01/06/22  0516 01/05/22  0654 01/03/22  0312 01/01/22  0353 12/31/21  0356   Sodium 136 130* 132* 133* 134*   Potassium 4.2 4.3 3.8 4.3 3.8   Chloride 106 104 101 100 101   CO2 18 13* 20 18 21    BUN 10.0 10.0 12.0 9.0 6.0*   Creatinine 0.6 0.6 0.6 0.6 0.6   Calcium 9.4 9.2 9.1 9.2 9.3   Albumin  --  3.2*  --   --  2.9*   Protein, Total  --  6.6  --   --  6.5   Bilirubin, Total  --  0.4  --   --  0.3   Alkaline Phosphatase  --  65  --   --  60   ALT  --  34  --   --  32   AST (SGOT)  --  23  --   --  20   Glucose 88 107* 96 111* 86   Magnesium  --  2.0  --   --   --    Phosphorus  --  4.2  --   --   --          Rads:   Radiological Procedure reviewed.  Radiology Results (24 Hour)       ** No results found for the last 24 hours. **          Physical Exam:     Vitals:    01/06/22 1129 01/06/22 1135 01/06/22 1150 01/06/22 1300   BP: 130/83 116/74 123/82 103/72   Pulse: (!) 116 (!) 121 (!) 114 (!) 107   Resp:       Temp:       TempSrc:       SpO2:       Weight:       Height:           Intake and Output Summary (Last 24 hours) at Date Time    Intake/Output Summary (Last 24 hours) at 01/06/2022 1508  Last data filed at 01/06/2022 0854  Gross per 24 hour   Intake 700 ml   Output 250 ml   Net 450 ml     P.O.: 120 mL (01/06/22 0854)     Urine: 0 mL (01/06/22 0854)             Gen: Alert, NAD  Psych: Pleasant and cooperative  Skin: Pink, warm, no rashes  HEENT: Atraumatic, normocephalic,   Chest: symmetric bilateral chest rise and fall,   CV: Regular  rate and rhythm, no murmurs  Resp: breath sounds equal throughout; ctax2  Abd: non-distended, +BS  Extr: Pink, warm,  Neuro:  Moving all ext     Assessment and Plan:     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   Occupational Therapy focused on improving strength, endurance, transfers, activities of daily living, and cognition for 60-120 mins/day, 5-6 days/week, for 14 days.     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%   Metoprolol,   losartan held due to hypotension     # COPD  Symbicort      # bipolar disorder  Latuda at home  Seroquel in hospital  Remeron for sleep and appetite     # Pain Management   Daily monitoring   gabapentin  Acetaminophen, dilaudid PRN     #Bowel Management  constipation - Senna, Colace, Bisacodyl suppository PRN; hold for loose stools    #Bladder Management  Check PVRs    #Skin Management  Frequent skin assessment  Moisture barrier PRN    #FEN/GI with severe malnutrition   cardiac diet    #DVT PPx  eliquis    #  Dispo  Pending rehab progress      Continue comprehensive and intensive inpatient rehab program, including:   Physical therapy 60-120 min daily, 5-6 times per week, Occupational therapy 60-120 min daily, 5-6 times per week, Case management and Rehabilitation nursing.  Psychology services to evaluate and treat as needed     Signed by: Cheryll Dessert, MD   01/06/2022, 3:08 PM    [facility]

## 2022-01-06 NOTE — PT Progress Note (Signed)
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 Pt continues to display significant anxiety with OOB activity and has elevated HR at rest (120s baseline, up to 150s with activity). She requires encouragement and education throughout and c/o frequent feelings of dizziness, however BP remains stable throughout  with no evidence of orthostatic hypotension. Ted hose, ab binder and ace wraps donned for safety.     Session focuses on OOB tolerance. She transfers to w/c via modified squat pivot and SBA, needs min A at end of session once fatigued. Obtained RW and practiced 2 stands with CGA; VC given to not lock knees and not hold breath. She tolerates standing approximately 10-20 seconds before requesting to sit due to c/o dizziness.  Pt then engaged in NuStep lvl 1 using BUE and BLE for 3' to promote cardiovascular endurance. Pt able to tolerate sitting in chair for lunch due to stable BP.       Education Documentation  Safety issues and interventions, taught by Doylene CanningEveland, Libbi Towner, PT at 01/06/2022 12:38 PM.  Learner: Patient  Readiness: Acceptance  Method: Explanation  Response: Trenton GammonVerbalizes Understanding, Needs  Reinforcement    Rehab techniques/procedure, taught by Doylene CanningEveland, Meshell Abdulaziz, PT at 01/06/2022 12:38 PM.  Learner: Patient  Readiness: Acceptance  Method: Explanation  Response: Trenton GammonVerbalizes Understanding, Needs Reinforcement    Precautions, taught by Doylene CanningEveland, Field Staniszewski, PT at 01/06/2022 12:38 PM.  Learner: Patient  Readiness: Acceptance  Method: Explanation  Response: Verbalizes Understanding, Needs Reinforcement    Functional transfers/mobility, taught by Doylene CanningEveland, Elsie Sakuma, PT at 01/06/2022 12:38 PM.  Learner: Patient  Readiness: Acceptance  Method: Explanation  Response: Verbalizes Understanding, Needs Reinforcement    Fall prevention/balance training, taught by Doylene CanningEveland, Shariece Viveiros, PT at 01/06/2022 12:38 PM.  Learner: Patient  Readiness: Acceptance  Method: Explanation  Response: Trenton GammonVerbalizes Understanding, Needs Reinforcement    Equipment, taught by Doylene CanningEveland, Radonna Bracher, PT at 01/06/2022 12:38 PM.  Learner: Patient  Readiness: Acceptance  Method: Explanation  Response: Verbalizes Understanding, Needs Reinforcement    Education Comments  No comments found.         Assessment & Plan   Pt making slow progress in PT, able to tolerate session OOB for first time this AR stay. She continues to display high levels of anxiety and needs frequent encouragement that she is safe in order to maximize her participation. Continue with POC.     Plan:  Risks/Benefits/POC Discussed with Pt/Family: With patient  Treatment/Interventions: Gait training, Exercise, Stair training, Neuromuscular re-education, Functional transfer training, LE strengthening/ROM, Endurance training, Patient/family training, Equipment eval/education, Bed mobility, Compensatory technique education, Continued  evaluation    Recommendation:  Discharge Recommendation: Home with home health PT  PT Therapy Recommendation: 5-6 days/week, 60-120 mins/day, 1:1 treatment  PT Estimated Length of Stay: 10-14 days from IE on 01/04/2022

## 2022-01-06 NOTE — Individualized Plan of Care (Signed)
Physical Medicine and Rehabilitation  Individualized Overall Plan of Care    Patient Name: [name]   Medical Record Number: [id number]   Date of Birth: Apr 01, 1956   Age: 66 y.o.   Sex: Female     Primary Rehab (Etiologic) Diagnosis: Myositis      Admit Date/Time: 01/03/2022 10:00 PM     Prognosis: fair    Plan of Care  Anticipated Discharge Date: 01/24/22  Estimated LOS: 21d  Anticipated Discharge Destination: home  Is the duration of therapy 15 hours over 7 days instead of the standard 3 hours of therapy, 5 out of 7 days a week?: No  Medical Necessity Expected Level Rationale: myositis/myopathy, cardiomyopathy   The patient will benefit from continued services by: Physical therapy 60-120 min daily, 5-6 times per week for 21 days, Occupational therapy 60-120 min daily, 5-6 times per week for 21 days, Case management, and Rehabilitation nursing    The following is a list of patient problems that have been identified by the interdisciplinary team:   Encounter Problems       Encounter Problems (Active)       Bladder Management       LTG: Pt will be 100% continent and independent with bladder elim at [place]       Start:  01/03/22    Expected End:  01/29/22         Goal Note       Pt is on Foley catheter                 Cardiac Function       LTG: Pt will be independent with BP and pulse management and medication at [place]       Start:  01/03/22    Expected End:  01/29/22               Compromised Tissue integrity       Damaged tissue is healing and protected       Start:  01/04/22       Interventions:  1. Monitor/assess Braden scale every shift  2. Provide wound care per wound care algorithm  3. Reposition patient every 2 hours and as needed unless able to reposition self  4. Increase activity as tolerated/progressive mobility  5. Relieve pressure to bony prominences for patients at moderate and high risk  6. Avoid shearing injuries   7. Keep intact skin clean and dry  8. Use bath wipes, not soap and water, for daily bathing    9. Use incontinence wipes for cleaning urine, stool and caustic drainage; Foley care as needed   10. Monitor external devices/tubes for correct placement to prevent pressure, friction and shearing   11. Encourage use of lotion/moisturizer on skin  12. Monitor patient's hygiene practices  13. Consult/collaborate with wound care nurse   14. Utilize specialty bed  15. Consider placing an indwelling catheter if incontinence interferes with healing of stage 3 or 4 pressure injury         Nutritional status is improving       Start:  01/04/22       Interventions:  1. Assist patient with eating   2. Allow adequate time for meals   3. Encourage patient to take dietary supplement(s) as ordered   4. Collaborate with Clinical Nutritionist  5. Include patient/patient care companion in decisions related to nutrition            Hemodynamic Status: Cardiac  Stable vital signs and fluid balance       Start:  01/04/22    Expected End:  01/06/22       Interventions:  1. Monitor/assess vital signs and telemetry per unit protocol  2. Weigh on admission and record weight daily  3. Assess signs and symptoms associated with cardiac rhythm changes  4. Monitor intake/output per unit protocol and/or LIP order  5. Monitor lab values  6. Monitor for leg swelling/edema and report to LIP if abnormal       Initial    Stable vital signs and fluid balance: Monitor/assess vital signs and telemetry per unit protocol;Assess signs and symptoms associated with cardiac rhythm changes;Monitor intake/output per unit protocol and/or LIP order;Monitor for leg swelling/edema and report to LIP if abnormal;Monitor lab values;Weigh on admission and record weight daily            Inadequate Tissue Perfusion       Adequate tissue perfusion will be maintained       Start:  01/04/22    Expected End:  01/06/22       Interventions:  1. Monitor/assess vital signs  2. Monitor/assess lab values and report abnormal values  3. Monitor/assess neurovascular status  (pulses, capillary refill, pain, paresthesia, paralysis, presence of edema)  4. Monitor intake and output  5. Monitor/assess for signs of VTE (edema of calf/thigh redness, pain)  6. Monitor for signs and symptoms of a pulmonary embolism (dyspnea, tachypnea, tachycardia, confusion)  7. VTE Prevention: Administer anticoagulant(s) and/or apply anti-embolism stockings/devices as ordered  8. Encourage/assist patient as needed to turn, cough, and perform deep breathing every 2 hours  9. Reinforce use of ordered respiratory interventions (i.e. CPAP, BiPAP, Incentive Spirometer, Acapella, etc.)  10. Perform active/passive ROM  11. Reinforce ankle pump exercises  12. Increase mobility as tolerated/progressive mobility  13. Elevate feet  14. Position patient for maximum circulation/cardiac output  15. Place shoes or other foot protection on patient  16. Assess and monitor skin integrity  17. Provide wound/skin care       Initial    Adequate tissue perfusion will be maintained: Monitor/assess vital signs;Monitor/assess lab values and report abnormal values;Monitor/assess neurovascular status (pulses, capillary refill, pain, paresthesia, paralysis, presence of edema);Monitor intake and output;Monitor for signs and symptoms of a pulmonary embolism (dyspnea, tachypnea, tachycardia, confusion);Encourage/assist patient as needed to turn, cough, and perform deep breathing every 2 hours;Reinforce use of ordered respiratory interventions (i.e. CPAP, BiPAP, Incentive Spirometer, Acapella, etc.)            Mobility       LTG: Patient will ambulate >150 feet at a Mod I level and navigate 1 FOS with CGA, both with LRAD in order to access the home and community environments.              Nutrition       Nutritional intake is adequate       Start:  01/04/22    Expected End:  01/06/22       Interventions:  1. Monitor daily weights  2. Assist patient with meals/food selection  3. Allow adequate time for meals  4. Encourage/perform oral  hygiene as appropriate   5. Encourage/administer dietary supplements as ordered (i.e. tube feed, TPN, oral, OGT/NGT, supplements)  6. Consult/collaborate with Clinical Nutritionist  7. Include patient/patient care companion in decisions related to nutrition  8. Assess anorexia, appetite, and amount of meal/food tolerated  9. Consult/collaborate with Speech Therapy (swallow evaluations)  Initial    Nutritional intake is adequate: Monitor daily weights;Assist patient with meals/food selection;Encourage/perform oral hygiene as appropriate;Encourage/administer dietary supplements as ordered (i.e. tube feed, TPN, oral, OGT/NGT, supplements);Assess anorexia, appetite, and amount of meal/food tolerated;Consult/collaborate with Clinical Nutritionist;Include patient/patient care companion in decisions related to nutrition;Consult/collaborate with Speech Therapy (swallow evaluations);Allow adequate time for meals            Pain Management       LTG: Pt will achieve good pain control with current medication regimen as evidenced by ability to participate in 100% of therapy sessions.       Start:  01/03/22    Expected End:  01/29/22               Safety Risk       LTG: Pt will recognize limitations and use call bell 100% of the time prior to transfers in order to prevent fsalls       Start:  01/03/22    Expected End:  01/29/22               Self Care Management       LTG: Pt will perform ADL routine at spv level with use of compensatory strategies and AE prn upon d/c.              Skin Wound Management       LTG: Excoriation at buttocks will resolve by [place]       Start:  01/03/22    Expected End:  01/29/22                    Anticipated Interventions    The patient will undergo inpatient rehabilitation to manage complex medical and rehabilitation needs related to Myositis.    The patient will receive interdisciplinary care, including daily rehabilitation physician management for the following: Pain, Impaired sleep/wake  cycles, and Monitoring for medication side effects    Patient goals were discussed and agreed upon with the patient. I have reviewed the therapy notes and agree with the current plan of care.      Cheryll Dessert, MD  01/06/2022 2:52 PM

## 2022-01-06 NOTE — Plan of Care (Signed)
Problem: Pain Management  Goal: LTG: Pt will achieve good pain control with current medication regimen as evidenced by ability to participate in 100% of therapy sessions.  Outcome: Progressing   Patient c/o back pain relieved with lidocaine and  frequent repositioning.   Problem: Safety Risk  Goal: LTG: Pt will recognize limitations and use call bell 100% of the time prior to transfers in order to prevent fsalls  Outcome: Progressing   Call bell and bedside table within reach at all times

## 2022-01-06 NOTE — OT Progress Note (Signed)
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 Attempted to transfer from EOB to wheelchair, however patient began experiencing lightheadedness +pallor +diaphoresis. BP WFL.     Transitioned patient back to semi supine with mod A after donning BLE TED hose and completed ADL session at bed level due to sxs of orthostasis. She completed bathing and dressing using hospital bed elevation and bed rails for support. Donned abdominal binder and assisted patient back to EOB with SPV. She transferred into chair with min A via squat-pivot t/f.     Dependently transported to OT gym where session focused on transfer training and anterior leans. She completed squat pivot t/f from wheelchair to EOM with min A and completed anerior leans using exercise ball for support to increase confidence with forward leaning. Patient then completed "half stands" using BUE built-up handles. She was able to complete approx 5 before beginning to experience pallor and diaphoresis. Encouraged BLE/BUE exercises to improve BP but with minimal symptom improvement. Transitioned patient back to wheelchair with mod A.     BP slightly low but with minimal  10-15point decrease. Returned to bed with min A.     Self Care Functional Status:   Current Status Current Status Discharge Goal   Functional Area: Care Score: Comments:    Eating 5    Independent   Oral Hygiene 5    Independent   Toileting Hygiene 2    Independent   Shower/Bathe Self 88    Supervision or touching assistance   Upper Body Dressing 4 Bed level using bed rails Independent   Lower Body Dressing 3 Bed level due to [place] sxs, VC for tailor method and bridging to advance over hips Supervision or touching assistance   Putting On/Taking Off Footwear 3 Socks bed level via tailor sit; donnned TED hose max A Supervision or touching assistance     Mobility Functional Status:   Current   Status Current Status Discharge Goal   Functional Area: Care Score: Comments:    Chair/Bed-to-Chair Transfer 3 VC for sequence/technique Independent     Neuro Re-Ed  Balance Training: Sitting with perturbations all planes, Sitting reaching activities    Assessment & Plan   Patient positive for symptoms during increase in activity which caused a spike in HR but minimal affect on BP. MD notified to assess medical management to maximize participation in therapy session. She would benefit from BLE strengthening, cardiopulmonary endurance training as appropriate, and activities to promote OOB tolerance.

## 2022-01-06 NOTE — Progress Notes (Signed)
Nutrition Support Services  [facility]   Main Office Telephone: [phone]    Nutrition Follow-up    Emalynn Clewis 66 y.o. female   MRN: [id number]    Summary of Nutrition Recommendations:  Liberalize diet to regular from consistent CHO to optimize PO intake in the setting of malnutrition   Continue Glucerna TID with meals to optimize PO intake in the setting of malnutrition   Each Glucerna shake provides 220 kcal and 10 gm protein.  3. Weekly weights per standing scale as able  4. Record % PO intake per flowsheets  5. Consider adding appetite stimulant if PO intake does not improve by RD f/u. Previously discussed with MD.   ----------------------------------------------------------------------------------------------------------------                                                       ASSESSMENT DATA     Subjective Nutrition: F/u with pt at bedside, reports she is drinking 2-3 Glucerna/day but still with low food intake, only ate coffee and a few bites of her breakfast this AM. Was able to have BM 3/15.     Learning Needs: None this visit    Events of Current Admission:  66 y.o. year old female [facility] admit 2/7-2/17/23 for rhabdomyolysis + transaminitis + bilateral leg weakness + right paresthesias presents to [facility] with difficulty standing and walking, bilateral leg/hand weakness, bilateral leg/hand/face paresthesias. Hx of poor PO, wt loss, started on thiamine, K repleted. Admitted to acute rehab 3/15.    Medical Hx:  has a past medical history of Anemia, Asthma, Atrial fibrillation, Chronic obstructive pulmonary disease, Convulsions, Depression, Diabetes mellitus, Hyperlipidemia, Hypertension, and TIA (transient ischemic attack).     Active Hospital Problems    Diagnosis    Myositis       Orders Placed This Encounter   Procedures    Diet consistent carbohydrate    Glucerna Shake (8 Oz Cans) Quantity: A. One; Frequency: Once With lunch    Glucerna Shake (8 Oz Cans) Quantity: A. One; Frequency:  TID (3 times a day) with meals     Weight Monitoring Weight Weight Method   09/09/2020 95.255 kg Stated   12/24/2021 76.204 kg Bed Scale   12/25/2021 72.576 kg Stated   12/26/2021 72.576 kg    01/03/2022 60.827 kg Bed Scale       ANTHROPOMETRIC  Height: 165.1 cm (5\' 5" )  Weight: 60.8 kg (134 lb 1.6 oz)  Body mass index is 22.32 kg/m.       NFPE deferred this visit d/t most recent performed < 1 wk prior (3/15)    ESTIMATED NEEDS    Total Daily Energy Needs: 1824 to 2128 kcal  Method for Calculating Energy Needs: 30 kcal - 35 kcal per kg  at 60.8 kg (Actual body weight)  Rationale: Malnutrition     Total Daily Protein Needs: 72.96 to 91.2 g  Method for Calculating Protein Needs: 1.2 g - 1.5 g per kg at 60.8 kg (0 body weight)       Total Daily Fluid Needs: 1824 ml  Method for Calculating Fluid Needs: ABW x 30 ml  Rationale: General guideline    Pertinent Medications:   IVF:    apixaban, 5 mg, Q12H SCH  budesonide-formoterol, 2 puff, BID  lidocaine, 1 patch, Q24H  metoprolol succinate, 100  mg, QHS  mirtazapine, 7.5 mg, QHS  nystatin, 500,000 Units, QID  pantoprazole, 40 mg, QAM AC  potassium chloride, 40 mEq, Daily  QUEtiapine, 100 mg, QHS  rOPINIRole, 1 mg, QHS  senna-docusate, 2 tablet, QHS  thiamine, 100 mg, Daily  vitamin B-6, 50 mg, Daily  vitamin D, 25 mcg, Daily  vitamins/minerals, 1 tablet, Daily        Pertinent labs:  Recent Labs   Lab 01/06/22  0516 01/05/22  0654 01/03/22  0312 01/01/22  0353 12/31/21  0356   Sodium 136 130* 132* 133* 134*   Potassium 4.2 4.3 3.8 4.3 3.8   Chloride 106 104 101 100 101   CO2 18 13* 20 18 21    BUN 10.0 10.0 12.0 9.0 6.0*   Creatinine 0.6 0.6 0.6 0.6 0.6   Glucose 88 107* 96 111* 86   Calcium 9.4 9.2 9.1 9.2 9.3   Magnesium  --  2.0  --   --   --    Phosphorus  --  4.2  --   --   --    EGFR >60.0 >60.0 >60.0 >60.0 >60.0   WBC 9.34 11.07* 8.27 5.27 7.67   Hematocrit 28.4* 29.7* 26.6* 26.2* 26.2*   Hgb 9.1* 9.3* 8.6* 8.1* 8.1*                                                          NUTRITION DIAGNOSIS     Inadequate Protein-energy intake related to decreased appetite as evidenced by 30% po intake.-Active    Severe malnutrition related to inadequate protein-energy intake with increased protein-energy needs in the context of acute illness as evidenced by moderate fat loss ( orbital and buccal fat pads); moderate muscle loss ( temporalis, pectoralis,deltoid, interosseous, quadriceps); 20% weight loss in 10 days.-Active     Unintentional weight loss related to decreased po intake as evidenced by 20% weight loss in 10 days.-Active                                                           INTERVENTION     Nutrition recommendation - Please refer to top of note                                                       MONITORING/EVALUATION     Goals: Po intake increases to 75% or more to meet estimated needs.-Active    Monitor: PO intake, ONS tolerance, wts, labs     Follow up: 01/10/2022    Tilda Burrowlivia Breia Ocampo, MS, RD  Clinical Dietitian   [phone]

## 2022-01-06 NOTE — OT Progress Note (Addendum)
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  Pulse (SpO2): 97  BP: 103/72  BP Location: Right arm  BP Method: Automatic  Patient Position: Sitting    *RN notified of HR-states MD is aware    Interventions: Pt agreeable to participation in therapy session. Handoff with RN appreciated prior to session, reporting no concerns with participation in session other than some nausea earlier for which she received medication. Pt received in bed. Session focused on increasing pts comfort with OOB activity. Initially quite motivated to participate.    Bed mobility: SBA supine<>sit  Attempted to stand at bedside for [facility] readings. Sit<>stand completed c CGA c RW but after standing x 30s pt c/o lightheadedness and self initiated seat back onto bed  EOB exercises: x 10 ea-knee lifts, knee extension c 3 s sustained holds, ankle pumps  Bed<>w/c: close SBA for squat pivot with arm rest removed  W/C<>toilet: close SBA to/from using grab bars and arm rest removed pt         Ot was about to attempt shower bench transfer but pt asks if session can be discontinued-does not offer reason but seems anxious and says "I feel a little crazy in my head." OT asks if pt would be willing to  engage in guided deep breathing exercise which she was willing to do. Returned to bed c close SBA.        OT provided instruction in guided deep breathing exercise x 7 min focused on activating parasympathetic nervous system. Pt participates fully and finds benefit but she told Clinical research associate after that it was "a lot". Did not expand on this but likely was not intending for breathing exercise to last as long as it did. OT also mentioned to pt about sensory interventions such as lavender and tapping for anxiety mgmt. Pt may be willing to explore.     Pt left in bed with call button in reach and all personal needs within reach. RN notified of pts elevated HR.          Education Documentation  Rehab techniques/procedure, taught by Dianne Dun, OT at 01/06/2022  2:51 PM.  Learner: Patient  Readiness: Acceptance  Method: Explanation, Demonstration  Response: [name] Understanding, Demonstrated Understanding    Functional transfers/mobility, taught by Dianne Dun, OT at 01/06/2022  2:51 PM.  Learner: Patient  Readiness: Acceptance  Method: Explanation, Demonstration  Response: Verbalizes Understanding, Demonstrated Understanding    Education Comments  No comments found.        Assessment & Plan   Pt demo increased tolerance for OOB activity but only for about half of session before  requesting to cease activity. Was able to continue to engage in session via deep breathing exercises. She is making slow progress and may need additional time and reminders/further education of anxiety mgmt strategies to continue to persist in challenging OOB activities.Continue per OT POC.    Plan:  Risks/Benefits/POC Discussed with Pt/Family: With patient  Treatment Interventions: ADL retraining, Functional transfer training, UE strengthening/ROM, Endurance training, Cognitive reorientation, Patient/Family training, Equipment eval/education, Neuro muscular reeducation, Fine motor coordination activities, Compensatory technique  education    Recommendation:  Discharge Recommendation: Home with home health OT, Home with home health PT  OT Therapy Recommendation: 5-6 days/week, 60-120 mins/day, 1:1 treatment, Group therapy  OT Estimated Length of Stay: 14 days

## 2022-01-06 NOTE — Progress Notes (Signed)
IRF PPS Data Collection Rights and Rehab Privacy Act Notice: Patient and family/caregiver received a copy of the IRF-PAI Data Collection Rights and Rehab Privacy Act Notice.

## 2022-01-06 NOTE — OT Progress Note (Signed)
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 That is all I can do right now."  Patient/Caregiver Goals: I want to be independent  Pain Assessment:  Pain Assessment: No/denies pain    Objective   Vitals:  Heart Rate: (!) 107  BP: 125/75  BP Location: Right arm  Patient Position: Lying    Interventions: Appreciate direct handoff from nsg - reporting pt with high HR in morning OT session and to monitor. Received patient supine in bed, asleep. Easily aroused. Agreeable to OT missed minutes session. Vitals monitored t/o session: BP 125/75, Hr fluctuating between 107-114 with activity while supine in bed.   Ot provided education on vitals fluctuation - discussed role of medication, time, and context in role of hemodynamic stability. Additionally, pt with concerns regarding weight loss and OT discussed importance of nutrition with patient; reports she is eating but Ot encouraging her to select meals she finds appealing, pt very agreeable to education. Session focused on bridging technique and strengthening for optimized engagement in ADL. Completed 2x10 bridging supine in bed with focus on isometric hold, 2x10 bridging in supine with focus on improved height clearance from bed, and massed bridging practice to don/dof red theraband as simulated pants waist band. Provided verbal cues for encouragement, orientation for task completion. Rest breaks as needed between  trials. Following above activity, pt reporting fatigue, however, participated in 2x10 BLE leg extensions for improved glute and quad activation. Despite encouragement, pt deferring continued OT services 2/2 fatigue. All needs met and verbal handoff with nsg.    Assessment:          Education Documentation  Home exercise program, taught by Ortencia Kick, OT at 01/06/2022 10:00 AM.  Learner: Patient  Readiness: Acceptance  Method: Explanation, Teach Back  Response: Verbalizes Understanding    ADL retraining, taught by Ortencia Kick, OT at 01/06/2022 10:00 AM.  Learner: Patient  Readiness: Acceptance  Method: Explanation, Teach Back  Response: Verbalizes Understanding    Education Comments  No comments found.        Assessment & Plan   Pt presenting with fatigue limiting full engagement in OT session. However, demonstrating motivation for therapy participation and progress towards functional goals, evident by massed practice of LE muscle activations within functional context. Pt would benefit from continued services to maximize strength and activity tolerance for discharge.    Plan:  Risks/Benefits/POC Discussed with Pt/Family: With patient  Treatment Interventions: ADL retraining, Functional transfer training, UE strengthening/ROM, Endurance training, Cognitive reorientation, Patient/Family training, Equipment eval/education, Neuro muscular reeducation, Fine motor coordination activities, Compensatory technique education    Recommendation:  Discharge Recommendation: Home with home health OT, Home with home health PT  OT Therapy Recommendation: 5-6 days/week, 60-120 mins/day, 1:1 treatment, Group therapy  OT Estimated Length of Stay: 14 days

## 2022-01-06 NOTE — Plan of Care (Signed)
Problem: Bladder Management  Goal: LTG: Pt will be 100% continent and independent with bladder elim at [place]  Outcome: Progressing  Note: Pt is on Foley catheter

## 2022-01-07 DIAGNOSIS — Z8659 Personal history of other mental and behavioral disorders: Secondary | ICD-10-CM

## 2022-01-07 DIAGNOSIS — Z8709 Personal history of other diseases of the respiratory system: Secondary | ICD-10-CM

## 2022-01-07 DIAGNOSIS — M6089 Other myositis, multiple sites: Secondary | ICD-10-CM

## 2022-01-07 DIAGNOSIS — I480 Paroxysmal atrial fibrillation: Secondary | ICD-10-CM

## 2022-01-07 DIAGNOSIS — E119 Type 2 diabetes mellitus without complications: Secondary | ICD-10-CM

## 2022-01-07 LAB — PARANEOPLASTIC AUTOANTIBODY EVAL, S
AGNA-1 Antibody IFA: NEGATIVE
ANNA-1 (HU) Antibody IFA: NEGATIVE
ANNA-2 (RI) Antibody IFA: NEGATIVE
ANNA-3 Antibody IFA: NEGATIVE
Amphiphysin Antibody IFA: NEGATIVE
CRMP-5 IgG IFA: NEGATIVE
Calcium Channel, Ab P/Q-Type: 0 nmol/L (ref <=0.02)
PCA-1 (YO) Antibody IFA: NEGATIVE
PCA-2 Antibody IFA: NEGATIVE
PCA-Tr (DNER) Antibody IFA: NEGATIVE
VGKC Antibody: 0 nmol/L (ref <=0.02)

## 2022-01-07 MED ORDER — PATIENT SUPPLIED NON FORMULARY
120.0000 mg | Freq: Every day | Status: DC
Start: 2022-01-07 — End: 2022-01-07

## 2022-01-07 MED ORDER — LURASIDONE HCL 120 MG PO TABS
120.0000 mg | ORAL_TABLET | Freq: Every evening | ORAL | Status: DC
Start: 2022-01-07 — End: 2022-01-26
  Administered 2022-01-07 – 2022-01-26 (×20): 120 mg via ORAL

## 2022-01-07 MED ORDER — LORAZEPAM 0.5 MG PO TABS
0.5000 mg | ORAL_TABLET | Freq: Every evening | ORAL | Status: DC | PRN
Start: 2022-01-07 — End: 2022-01-26
  Administered 2022-01-10 – 2022-01-22 (×7): 0.5 mg via ORAL
  Filled 2022-01-07 (×7): qty 1

## 2022-01-07 MED ORDER — PROMETHAZINE HCL 25 MG PO TABS
25.0000 mg | ORAL_TABLET | Freq: Four times a day (QID) | ORAL | Status: DC | PRN
Start: 2022-01-07 — End: 2022-01-26
  Administered 2022-01-07 – 2022-01-22 (×21): 25 mg via ORAL
  Filled 2022-01-07 (×21): qty 1

## 2022-01-07 NOTE — OT Progress Note (Signed)
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 I'm just not feeling it. My daughter said she can tell in my voice that I have lost my fight."  Patient/Caregiver Goals: I want to be independent  Pain Assessment:  Pain Assessment: Numeric Scale (0-10)  Pain Score: 7-severe pain  Pain Location: Generalized  Pain Descriptors: Aching  Effect of Pain on Daily Activities: moderate  Pain Intervention(s): Medication (See eMAR), Repositioned, Distraction, Prayers, Relaxation technique, Rest    Objective   Vitals:   01/07/22 0840 01/07/22 0842 01/07/22 0845   Vitals   Heart Rate (!) 166 (!) 127 (!) 116   BP (!) 86/58 99/64 102/67   BP Location  --   --  Left arm   MAP (mmHg) 67 76 79   Patient Position  --   --  Sitting     Interventions: Patient greeted seated upright in bed agreeable to OT session. She reported that she would like to complete pericare and dressing. She completed anterior and posterior pericare at bed level using bath wipes.     She then transitioned to EOB using bed rails with CGA. Patient was able to thread BLE into pants and use lateral leans to advance over hips with overall modA for managing foley, advancing over buttocks and to prevent sliding forward off of bed. She completed UB dressing with set-up A.     Patient with BP as above citing increased fatigue. Donned BLE TED hose and returned patient into chair position.  Patient feeling extremely down requesting for rest of sessions to be canceled. Educated on importance of OOB therapies to maximize return in function. Additional education provided on IPR expectations, POC, and additional psychosocial supports.     Provided encouragement and therapeutic listening.    Education Documentation  Safety issues and interventions, taught by Davonna Belling, OT at 01/07/2022  9:03 AM.  Learner: Patient  Readiness: Acceptance  Method: Explanation, Demonstration  Response: Demonstrated Understanding, Verbalizes Understanding, Needs Reinforcement    Rehab techniques/procedure, taught by Davonna Belling, OT at 01/07/2022  9:03 AM.  Learner: Patient  Readiness: Acceptance  Method: Explanation, Demonstration  Response: Demonstrated Understanding, Verbalizes Understanding, Needs Reinforcement    Precautions, taught by Davonna Belling, OT at 01/07/2022  9:03 AM.  Learner: Patient  Readiness: Acceptance  Method: Explanation, Demonstration  Response: Demonstrated Understanding, Verbalizes Understanding, Needs Reinforcement    Plan of care, taught by Davonna Belling, OT at 01/07/2022  9:03 AM.  Learner: Patient  Readiness: Acceptance  Method: Explanation, Demonstration  Response: Demonstrated Understanding, Verbalizes Understanding, Needs Reinforcement    Fall prevention/balance training, taught by Davonna Belling, OT at 01/07/2022  9:03 AM.  Learner: Patient  Readiness: Acceptance  Method: Explanation, Demonstration  Response: Demonstrated Understanding, Verbalizes Understanding, Needs Reinforcement    ADL retraining, taught by Davonna Belling, OT at 01/07/2022  9:03 AM.  Learner: Patient  Readiness: Acceptance  Method: Explanation, Demonstration  Response: Demonstrated Understanding, Verbalizes Understanding, Needs Reinforcement    Education Comments  No comments found.      Assessment & Plan   Patient is limited by her OH, high HR, fatigue, and poor engagement due to decreased motivation and depressive  mood. She was receptive to education and encouragement. She would benefit from resuming bipolar medications as well as psychosocial support to maximize participation in therapy. Continue OT POC with focus on hemodynamic stability, OOB tolerance, transfer training.    Plan:  Risks/Benefits/POC Discussed with Pt/Family: With patient  Treatment Interventions: ADL retraining, Functional transfer training, UE strengthening/ROM, Endurance training, Cognitive reorientation, Patient/Family training, Equipment eval/education, Neuro muscular reeducation, Fine motor coordination activities, Compensatory technique education    Recommendation:  Discharge Recommendation: Home with home health OT, Home with home health PT  OT Therapy Recommendation: 5-6 days/week, 60-120 mins/day, 1:1 treatment, Group therapy  OT Estimated Length of Stay: 14 days

## 2022-01-07 NOTE — Plan of Care (Signed)
Problem: Pain Management  Goal: LTG: Pt will achieve good pain control with current medication regimen as evidenced by ability to participate in 100% of therapy sessions.  Outcome: Progressing   Pain managed with lidocaine and frequent turning /repositioning.  Problem: Safety Risk  Goal: LTG: Pt will recognize limitations and use call bell 100% of the time prior to transfers in order to prevent fsalls  Outcome: Progressing   Patient has good safety awareness and calls appropriately for assistance with transfers .  Problem: Skin Wound Management  Goal: LTG: Excoriation at buttocks will resolve by [place]  Outcome: Progressing   Buttock excoriation is improving with z guard treatment.

## 2022-01-07 NOTE — Progress Notes (Signed)
Chaplain Service      Background:  Visit Type: Initial was made by Chaplain with patient, [name], based on Source: Chaplain Initiated.  Present at Visit: Patient, Family members.  Spiritual Care Provided to: patient, family members.    .    Summary:  Spiritual Care Interventions: Provided comfort, encouragement,affirmation  Reason for Visit: Spiritual Assessment, Spiritual Support   Spiritual Care Outcomes: Patient expressed appreciation of visit, Family expressed appreciation of visit

## 2022-01-07 NOTE — OT Progress Note (Signed)
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 RN cleared for therapy with no new medical concerns. Pt declined mobility training at this time, reporting too fatigued from AM therapy. Some memory deficits note with pt reporting multiple bouts of therapy prior to this Clinical research associate. Recommending to primary OT SLP eval to further assess.    Pt benefiting from focus on relaxation facilitation via Healing Touch session. Tolerating well with notable decrease in HR and BP pre and post session. Also benefits with therapeutic use of music with peaceful gospel.     Assessment:    Education Documentation  Rehab techniques/procedure, taught by Idamae Lusher at 01/07/2022 10:15 AM.  Learner: Patient  Readiness: Acceptance  Method: Explanation  Response: Verbalizes Understanding    Education Comments  No comments found.      Assessment & Plan   Pt will benefit from SLP eval to further examine her memory deficits. Cont with Healing Touch as pt tolerates and in conjunction with mobility participation. Cont POC.    Plan:  Risks/Benefits/POC Discussed with Pt/Family: With patient  Treatment Interventions: ADL retraining, Functional transfer  training, UE strengthening/ROM, Endurance training, Cognitive reorientation, Patient/Family training, Equipment eval/education, Neuro muscular reeducation, Fine motor coordination activities, Compensatory technique education    Recommendation:  Discharge Recommendation: Home with home health OT, Home with home health PT  OT Therapy Recommendation: 5-6 days/week, 60-120 mins/day, 1:1 treatment, Group therapy  OT Estimated Length of Stay: 14 days

## 2022-01-07 NOTE — Progress Notes (Signed)
CM attempted  to meet with patient at bedside.   Patient sleeping very soundly.  CM did not wake patient as she did not rest well last night.

## 2022-01-07 NOTE — Progress Notes (Signed)
MEDICINE PROGRESS NOTE  [facility], DIVISION OF HOSPITALIST MEDICINE   [facility]   [place] Pager: [phone]      Date Time: 01/07/22 12:18 PM  Patient Name: [name],[name]  Attending Physician: Anastasia PallPerez Gonzalez, Edson SnowballCarlos REdward [place]*  Hospital Day: 5  Assessment:     Active Hospital Problems    Diagnosis    Myositis     66 year old female with past medical history significant for diabetes, hypertension, nonischemic cardiomyopathy, paroxysmal A-fib, anemia, bipolar,, TIAs, patient [facility] on [facility] with lower extremity weakness, paresthesias, transaminitis thought to have 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  Patient was subsequent discharged on 2/17 will need to go back to the hospital on 2/18 for continued management of rhabdomyolysis.  Patient noted to have left-sided weakness with associated difficulty with walking, standing, also developing bilateral hand and feet paresthesias and hand weakness she subsequent [facility] and was seen by neurology, work-up including MRIs, lumbar puncture done with no clear etiology subsequent with [facility].  [facility] first dose of IVIG was started outpatient setting developed allergic reaction therefore discontinued.  Diagnosis at time of discharge unclear include possible statin induced myositis versus inflammatory myositis versus immune mediated myositis.  She was noted to have multiple vitamin deficiencies including D, zinc, B6, iron deficiency which was treated.  Patient was discharged to acute rehab on 3/14.    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 and less likely to suggest autoimmune process   -Multiple vitamin deficiencies including vitamin D, zinc, B6, iron which were replaced  -PT/OT  -Avoid statins  -May need  muscle biopsy?; extensive work-up nonrevealing  -f/u paraneoplastic antibodies from 3/7     Vitamin D, B6, B1, zinc deficiencies  -Continue multivitamin supplement  -Continue thiamine(received IV replacement 3/4-3/9), vitamin B6, zinc supplements     Microcytic anemia  -Noted TSAT of 21%, ferritin 492, normal folate level on 3/10; normal vitamin B12 level 3/4  -cont ferrous sulfate  -Hb stable     Moderate  malnutrition  -Weight loss, was on Ozempic but discontinued  -Nutrition consult  -Currently on Remeron     Urinary retension  -Currently has a Foley catheter, failed voiding trial on 3/13   -Consider another voiding trial early next week  -May consider adding Flomax but will need to watch BP; noted intermittently low     COPD--Not in exacerbation   -continue Symbicort  -prn duonebs     Paroxysmal A-fib  -Continue metoprolol XL 100 mg  -Continue Eliquis  -Follow heart rate, may need further increase in metoprolol if heart rate consistently elevated  -Add telemetry for now     History of nonischemic cardiopathy  -Echo 12/31/2021 with EF 43%, mild global hypokinesis  -will ultimately need outpatient work-up for ischemia once weakness improves     Bipolar disorder  -Continue home meds including Seroquel, ropinirole; re-start home latuda 120mg  daily(pt has home med)     Diabetes--Hemoglobin A1c 5.3 on 3/6  -Patient had been on Ozempic but stopped due to malnutrition/weight loss  -No need for sliding-scale for now, can check as needed    Add prn promethazine  at pt request as she reports more effective than zofran    Discussed with patient and patient's family at bedside    Subjective     CC: Myositis  HPI/Subjective: Patient seen and examined.  Patient reporting general fatigue but denies any chest  pain.  Patient reports some chronic shortness of breath which is at baseline    Review of Systems:   Review of Systems - Negative except as above in HPI    Physical Exam:     Temp:  [97.5 F (36.4 C)-99.7 F (37.6 C)] 97.5 F (36.4 C)  Heart Rate:  [101-166] 111  Resp Rate:  [17-18] 18  BP: (86-150)/(58-89) 132/83    Intake/Output Summary (Last 24 hours) at 01/07/2022 1218  Last data filed at 01/07/2022 4098  Gross per 24 hour   Intake 340 ml   Output 550 ml   Net -210 ml    General: alert and awake, no distress  HEENT: anicteric, moist mucous membranes  Neck: supple, no JVD  CVS: nl S1, S2; no gallop or rub  Lungs: clear to auscultation b/l, no wheezing or rhonchi; normal resp effort  Abd: soft, non-tender, non-distended  Ext: no pedal edema, no cyanosis  Neuro: alert and awake, moving all extremities       Meds:   Medications were reviewed:  Scheduled Meds:  Current Facility-Administered Medications   Medication Dose Route Frequency    apixaban  5 mg Oral Q12H SCH    budesonide-formoterol  2 puff Inhalation BID    ferrous sulfate  324 mg Oral QAM W/BREAKFAST    lidocaine  1 patch Transdermal Q24H    metoprolol succinate  100 mg Oral QHS    mirtazapine  7.5 mg Oral QHS    nystatin  500,000 Units Oral QID    pantoprazole  40 mg Oral QAM AC    potassium chloride  40 mEq Oral Daily    QUEtiapine  100 mg Oral QHS    rOPINIRole  1 mg Oral QHS    senna-docusate  2 tablet Oral QHS    thiamine  100 mg Oral Daily    vitamin B-6  50 mg Oral Daily    vitamin D  25 mcg Oral Daily    vitamins/minerals  1 tablet Oral Daily    zinc sulfate  220 mg Oral Daily     Continuous Infusions:  PRN Meds:.acetaminophen, albuterol-ipratropium, bisacodyl, diphenhydrAMINE, gabapentin, HYDROmorphone, lactulose, melatonin, ondansetron  Labs/Radiology:   Imaging personally reviewed, including: all available   No results found.  No results for input(s): GLUCOSEWB in the last 24 hours.  Recent Labs   Lab 01/06/22  0516 01/05/22  0654  01/03/22  0312 01/01/22  0353   Sodium 136 130* 132* 133*   Potassium 4.2 4.3 3.8 4.3   Chloride 106 104 101 100   BUN 10.0 10.0 12.0 9.0   Creatinine 0.6 0.6 0.6 0.6   EGFR >60.0 >60.0 >60.0 >60.0   Glucose 88 107* 96 111*   Calcium 9.4 9.2 9.1 9.2     Recent Labs   Lab 01/06/22  0516 01/05/22  0654 01/03/22  0312 01/01/22  0353   WBC 9.34 11.07* 8.27 5.27   Hgb 9.1* 9.3* 8.6* 8.1*   Hematocrit 28.4* 29.7* 26.6* 26.2*   Platelets 490* 330 418* 369*         Recent Labs   Lab 01/05/22  0654   Alkaline Phosphatase 65   Bilirubin, Total 0.4   ALT 34  AST (SGOT) 23       This note was generated by the Epic EMR system/ Dragon speech recognition and may contain inherent errors or omissions not intended by the user. Grammatical errors, random word insertions, deletions and pronoun errors  are occasional consequences of this technology due to software limitations. Not all errors are caught or corrected. If there are questions or concerns about the content of this note or information contained within the body of this dictation they should be addressed directly with the author for clarification.    Signed by: Ilda Foil, MD

## 2022-01-07 NOTE — Plan of Care (Signed)
Problem: Bladder Management  Goal: LTG: Pt will be 100% continent and independent with bladder elim at [place]  Outcome: Progressing     Problem: Cardiac Function  Goal: LTG: Pt will be independent with BP and pulse management and medication at [place]  Outcome: Progressing     Problem: Pain Management  Goal: LTG: Pt will achieve good pain control with current medication regimen as evidenced by ability to participate in 100% of therapy sessions.  Outcome: Progressing     Problem: Safety Risk  Goal: LTG: Pt will recognize limitations and use call bell 100% of the time prior to transfers in order to prevent fsalls  Outcome: Progressing     Problem: Skin Wound Management  Goal: LTG: Excoriation at buttocks will resolve by [place]  Outcome: Progressing     Problem: Self Care Management  Goal: LTG: Pt will perform ADL routine at spv level with use of compensatory strategies and AE prn upon d/c.   Outcome: Progressing

## 2022-01-07 NOTE — Progress Notes (Signed)
PHYSICAL MEDICINE AND REHABILITATION  PROGRESS NOTE  FACE TO Bruce Donath    Date Time: 01/07/22 2:59 PM    Patient Name: [name],[name]  Admission date:  01/03/2022  (LOS: 4 days)    Subjective:     Patient seen with daughter at side  Daughter brought in home med latuda, she usually takes late in afternoon   Pending neurology eval   SLP eval for cognition     Functional Status:     Interventions: Pt received supine in bed, agreeable to OT treatment. RN cleared for therapy with no new medical concerns. Pt declined mobility training at this time, reporting too fatigued from AM therapy. Some memory deficits note with pt reporting multiple bouts of therapy prior to this Clinical research associate. Recommending to primary OT SLP eval to further assess.     Pt benefiting from focus on relaxation facilitation via Healing Touch session. Tolerating well with notable decrease in HR and BP pre and post session. Also benefits with therapeutic use of music with peaceful gospel.     Medications:   Medication reviewed by me:     Scheduled Meds: PRN Meds:    apixaban, 5 mg, Oral, Q12H SCH  budesonide-formoterol, 2 puff, Inhalation, BID  ferrous sulfate, 324 mg, Oral, QAM W/BREAKFAST  lidocaine, 1 patch, Transdermal, Q24H  lurasidone, 120 mg, Oral, QPM  metoprolol succinate, 100 mg, Oral, QHS  mirtazapine, 7.5 mg, Oral, QHS  nystatin, 500,000 Units, Oral, QID  pantoprazole, 40 mg, Oral, QAM AC  potassium chloride, 40 mEq, Oral, Daily  QUEtiapine, 100 mg, Oral, QHS  rOPINIRole, 1 mg, Oral, QHS  senna-docusate, 2 tablet, Oral, QHS  thiamine, 100 mg, Oral, Daily  vitamin B-6, 50 mg, Oral, Daily  vitamin D, 25 mcg, Oral, Daily  vitamins/minerals, 1 tablet, Oral, Daily  zinc sulfate, 220 mg, Oral, Daily        Continuous Infusions:   acetaminophen, 650 mg, Q4H PRN  albuterol-ipratropium, 3 mL, Q6H PRN  bisacodyl, 10 mg, Daily PRN  diphenhydrAMINE, 25 mg, Q6H PRN  gabapentin, 100 mg, TID PRN  HYDROmorphone, 2 mg, Q6H PRN  lactulose, 20 g, Q4H PRN  LORazepam,  0.5 mg, QHS PRN  melatonin, 3 mg, QHS PRN  promethazine, 25 mg, Q6H PRN          Medication Review  A complete drug regimen review was completed: Yes  Were drug issues were found during review: No   If yes to drug issues found during review:  What was the issue?:   What was the time the issue was identified?:   Was I contacted and action was taken by midnight of the next calendar day once issue was identified?: N/A   Person who contacted me:   Action taken:     Review of Systems:   A comprehensive review of systems was: No fevers, chills, nausea, vomiting, chest pain, shortness of breath, cough, headache, double vision.  All others negative.    Labs:     Recent Labs   Lab 01/06/22  0516 01/05/22  0654 01/03/22  0312 01/01/22  0353   WBC 9.34 11.07* 8.27 5.27   Hgb 9.1* 9.3* 8.6* 8.1*   Hematocrit 28.4* 29.7* 26.6* 26.2*   Platelets 490* 330 418* 369*      Recent Labs   Lab 01/06/22  0516 01/05/22  0654 01/03/22  0312 01/01/22  0353   Sodium 136 130* 132* 133*   Potassium 4.2 4.3 3.8 4.3   Chloride 106 104 101 100  CO2 18 13* 20 18   BUN 10.0 10.0 12.0 9.0   Creatinine 0.6 0.6 0.6 0.6   Calcium 9.4 9.2 9.1 9.2   Albumin  --  3.2*  --   --    Protein, Total  --  6.6  --   --    Bilirubin, Total  --  0.4  --   --    Alkaline Phosphatase  --  65  --   --    ALT  --  34  --   --    AST (SGOT)  --  23  --   --    Glucose 88 107* 96 111*   Magnesium  --  2.0  --   --    Phosphorus  --  4.2  --   --          Rads:   Radiological Procedure reviewed.  Radiology Results (24 Hour)       ** No results found for the last 24 hours. **          Physical Exam:     Vitals:    01/07/22 0842 01/07/22 0845 01/07/22 1022 01/07/22 1046   BP: 99/64 102/67 150/89 132/83   Pulse: (!) 127 (!) 116 (!) 118 (!) 111   Resp:       Temp:       TempSrc:       SpO2: 96% 99% 99%    Weight:       Height:           Intake and Output Summary (Last 24 hours) at Date Time    Intake/Output Summary (Last 24 hours) at 01/07/2022 1459  Last data filed at  01/07/2022 1610  Gross per 24 hour   Intake 340 ml   Output 550 ml   Net -210 ml     P.O.: 340 mL (01/07/22 0658)     Urine: 0 mL (01/07/22 0658)             Gen: Alert, NAD  Psych: Pleasant and cooperative  Skin: Pink, warm, no rashes  HEENT: Atraumatic, normocephalic,   Chest: symmetric bilateral chest rise and fall,   CV: Regular rate and rhythm, no murmurs  Resp: breath sounds equal throughout; non labored  Abd: non-distended, soft nontender   Extr: Pink, warm,  Neuro:  Fluent speech    Assessment and Plan:     Alec Mcphee is a 66 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   Occupational Therapy focused on improving strength, endurance, transfers, activities of daily living, and cognition for 60-120 mins/day, 5-6 days/week, for 14 days.     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%   Metoprolol,   losartan held due to hypotension     # COPD  Symbicort      # bipolar disorder  Latuda   Seroquel   Remeron for sleep and appetite     # Pain Management   Daily monitoring   gabapentin  Acetaminophen, dilaudid PRN     #  Bowel Management  constipation - Senna, Colace, Bisacodyl suppository PRN; hold for loose stools    #Bladder Management  Check PVRs    #Skin Management  Frequent skin assessment  Moisture barrier PRN    #FEN/GI with severe malnutrition   cardiac diet    #DVT PPx  eliquis    #Dispo  Pending rehab progress      Continue comprehensive and intensive inpatient rehab program, including:   Physical therapy 60-120 min daily, 5-6 times per week, Occupational  therapy 60-120 min daily, 5-6 times per week, Case management and Rehabilitation nursing.  Psychology services to evaluate and treat as needed     Signed by: Cheryll Dessert, MD   01/07/2022, 2:59 PM    [facility]

## 2022-01-07 NOTE — PT Progress Note (Signed)
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 My daughter came today and what she had to say was encouraging."  Patient/Caregiver Goals: I want to be independent  Pain Assessment:  Pain Assessment: No/denies pain    Objective   Vitals:  BP: 112/75  BP Location: Left arm  Patient Position: Sitting    Interventions: Pt received in bed when PT arrived. This therapist provided therapeutic listening based on pt's subjective report. Provided gentle, manual technique to assist with releasing tension throughout the body and for pain management. Pt sat at edge of bed. Performed seated marching in place x 20, LAQs x 20 and ankle pumps x 20. Pt transferred bed>w/c squat pivot with cga. Sit to stand at [name] with cga. Pt able to stand x 20 seconds and then reported feeling dizzy. Measured BP: 110/74 mmHg. Pt remained out of bed for ~20 minutes. Pt transferred w/c>bed squat pivot with cga and was able to independently manage lower extremities into bed.         Education Documentation  Rehab techniques/procedure, taught by Philbert Riser, PT at 01/07/2022  4:05 PM.  Learner: Patient  Readiness: Acceptance  Method: Explanation  Response: [name] Understanding, Demonstrated Understanding    Plan of care, taught by Philbert Riser, PT at 01/07/2022  4:05 PM.  Learner: Patient  Readiness: Acceptance  Method: Explanation  Response: Verbalizes Understanding,  Demonstrated Understanding    Education Comments  No comments found.         Assessment & Plan   Pt's was able to tolerate out of bed activity for ~20 minutes with vitals remaining stable during session. Pt appeared encouraged by therapeutic gains this session. Recommend continued therapy to increase lower extremity strength and activity tolerance to enable pt to return to community with assistance as needed.     Plan:  Risks/Benefits/POC Discussed with Pt/Family: With patient  Treatment/Interventions: Gait training, Exercise, Stair training, Neuromuscular re-education, Functional transfer training, LE strengthening/ROM, Endurance training, Patient/family training, Equipment eval/education, Bed mobility, Compensatory technique education, Continued evaluation    Recommendation:  Discharge Recommendation: Home with home health PT  PT Therapy Recommendation: 5-6 days/week, 60-120 mins/day, 1:1 treatment  PT Estimated Length of Stay: 10-14 days from IE on 01/04/2022

## 2022-01-07 NOTE — PT Progress Note (Signed)
Inpatient Rehabilitation Exception Note    [name] missed therapy today because of fatigue secondary to not sleeping well last night. Pt also reports increased anxiety. RN notified. Pt declined PT participation even with increased encouragement. Offered to work on breathing exercises, pt continues to decline.     Missed therapy minutes reason: Patient refusal - fatigue  Plan to make up missed minutes: Tomorrow, scheduled

## 2022-01-08 MED ORDER — METOPROLOL SUCCINATE ER 50 MG PO TB24
125.0000 mg | ORAL_TABLET | Freq: Every evening | ORAL | Status: DC
Start: 2022-01-09 — End: 2022-01-26
  Administered 2022-01-09 – 2022-01-25 (×17): 125 mg via ORAL
  Filled 2022-01-08 (×16): qty 1
  Filled 2022-01-08: qty 2
  Filled 2022-01-08: qty 1

## 2022-01-08 NOTE — Plan of Care (Signed)
Problem: Bladder Management  Goal: LTG: Pt will be 100% continent and independent with bladder elim at [place]  Outcome: Progressing     Problem: Cardiac Function  Goal: LTG: Pt will be independent with BP and pulse management and medication at [place]  Outcome: Progressing     Problem: Pain Management  Goal: LTG: Pt will achieve good pain control with current medication regimen as evidenced by ability to participate in 100% of therapy sessions.  Outcome: Progressing     Problem: Safety Risk  Goal: LTG: Pt will recognize limitations and use call bell 100% of the time prior to transfers in order to prevent fsalls  Outcome: Progressing     Problem: Skin Wound Management  Goal: LTG: Excoriation at buttocks will resolve by [place]  Outcome: Progressing

## 2022-01-08 NOTE — Progress Notes (Addendum)
At 13:30,while this writer positioned the wheelchair close to the bed and got [name] ready to scoot from the wheelchair to the bed, she started slipping off the wheelchair.  I then assisted her to the floor  to prevent her and myself from injury despite that she still abe to partially prop herself up with the left leg. I then called another staff to assist patient to stand and get back to bed. Dr.Ganji  made aware. [name] is aware.

## 2022-01-08 NOTE — Progress Notes (Signed)
[facility] Physiatry Attending Face to Face Progress Note   Functional Status/Update:   I reviewed patient's therapy notes to assess functional status and ongoing need for therapies. Of note, the patient was seen and discussed in rounds with nursing.  Medical records and recent imaging studies were reviewed.    Subjective:   No headache. Sleeping well. No constipation. No chest pain. No shortness of breath.     Objective:   Vitals:    01/08/22 0811 01/08/22 0815 01/08/22 0954 01/08/22 1113   BP: 104/67 103/70 113/71 110/69   Pulse: (!) 112 (!) 112 (!) 121 (!) 108   Resp:       Temp:   97.9 F (36.6 C)    TempSrc:   Oral    SpO2:   100% 100%   Weight:       Height:           Physical Examination:   Appears stated age.   Anicteric sclerae. Conjunctivae non-injected. EOMI.   Moist mucous membranes.   +S1S2 Heart rate and rhythm are regular. Trace edema in the distal lower extremities   Lungs are clear to auscultation bilaterally. No wheezes, rales, or rhonchi. Good respiratory effort.   Abdomen: Soft. Non-tender. Normoactive bowel sounds.   Alert Pleasant and cooperative Oriented x 3 Normal mood and affect     New Labs:  Results       ** No results found for the last 24 hours. **            Current medications:   Scheduled Meds:  Current Facility-Administered Medications   Medication Dose Route Frequency    apixaban  5 mg Oral Q12H SCH    budesonide-formoterol  2 puff Inhalation BID    ferrous sulfate  324 mg Oral QAM W/BREAKFAST    lidocaine  1 patch Transdermal Q24H    lurasidone  120 mg Oral QPM    metoprolol succinate  100 mg Oral QHS    nystatin  500,000 Units Oral QID    pantoprazole  40 mg Oral QAM AC    potassium chloride  40 mEq Oral Daily    QUEtiapine  100 mg Oral QHS    rOPINIRole  1 mg Oral QHS    senna-docusate  2 tablet Oral QHS    thiamine  100 mg Oral Daily    vitamin B-6  50 mg Oral Daily    vitamin D  25 mcg Oral Daily    vitamins/minerals  1 tablet Oral Daily    zinc sulfate  220 mg Oral Daily     PRN  Meds:.acetaminophen, albuterol-ipratropium, bisacodyl, diphenhydrAMINE, gabapentin, HYDROmorphone, lactulose, LORazepam, melatonin, promethazine      Medication Review  A complete drug regimen review was completed: Yes  Were any drug issues found during review?: No       Assessment: 66 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  We will discussed with internal medicine consultant.  Tolerating the therapy program patient was updated regarding her status and treatment plans

## 2022-01-08 NOTE — OT Progress Note (Signed)
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 She transitioned to EOB with min assist for bringing legs over to side of bed. She completed a squat>pivot transfer to w/c with CG and stabilization of her feet. She stood at elevated surface and expressed concerns that she didn't feel stabile, and sat precipitously. On subsequent attempts she increased her time and confidence. OT cued her to widen her stance. She propelled w/c independently short distance of 15 ft to access counter/cabinet area. She stood to perform item retrieval from cabinet. She used bilateral hands to place items on shelf above eye level, to match containers and lids, pinch clothespins, and pickup toothpicks. Returned to room and setup for lunch, she was willing to stay OOB for meal. Hand-off to RN and passed along pt's questions about her dietary menu options and low appetite worries.    Education Documentation  No documentation found.  Education Comments  No comments found.        Assessment & Plan   When distracted by conversation pt stood for longer periods of time. She demonstrates emerging weight shifts over ankles and  more controled use of her hands for fine motor prehension activities. Continue OT POC to increase OOB activity and confidence with functional transfers.    Plan:  Risks/Benefits/POC Discussed with Pt/Family: With patient  Treatment Interventions: ADL retraining, Functional transfer training, UE strengthening/ROM, Endurance training, Cognitive reorientation, Patient/Family training, Equipment eval/education, Neuro muscular reeducation, Fine motor coordination activities, Compensatory technique education    Recommendation:  Discharge Recommendation: Home with home health OT, Home with home health PT  OT Therapy Recommendation: 5-6 days/week, 60-120 mins/day, 1:1 treatment, Group therapy  OT Estimated Length of Stay: 14 days

## 2022-01-08 NOTE — Progress Notes (Signed)
MEDICINE PROGRESS NOTE  [facility], DIVISION OF HOSPITALIST MEDICINE   [facility]   [place] Pager: [phone]      Date Time: 01/08/22 11:51 AM  Patient Name: [name]  Attending Physician: Anastasia Pall, [facility] Day[facility] 6  Assessment:     Active Hospital Problems    Diagnosis    Myositis     66 year old female with past medical history significant for diabetes, hypertension, nonischemic cardiomyopathy, paroxysmal A-fib, anemia, bipolar,, TIAs, patient presented to [facility] on 2/7 with lower extremity weakness, paresthesias, transaminitis thought to have 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  Patient was subsequent discharged on 2/17 will need to go back to the hospital on 2/18 for continued management of rhabdomyolysis.  Patient noted to have left-sided weakness with associated difficulty with walking, standing, also developing bilateral hand and feet paresthesias and hand weakness she subsequent presented [facility] and was seen by neurology, work-up including MRIs, lumbar puncture done with no clear etiology subsequent with transfer to [facility] for consideration for IVIG.  While at [facility] first dose of IVIG was started outpatient setting developed allergic reaction therefore discontinued.  Diagnosis at time of discharge unclear include possible statin induced myositis versus inflammatory myositis versus immune mediated myositis.  She was noted to have multiple vitamin deficiencies including D, zinc, B6, iron deficiency which was treated.  Patient was discharged to acute rehab on 3/14.    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 and less likely to suggest autoimmune process   -Multiple vitamin deficiencies including vitamin D, zinc, B6, iron which were replaced  -PT/OT  -Avoid statins  -May need  muscle biopsy?; extensive work-up nonrevealing  -Negative paraneoplastic autoantibody from 3/7     Vitamin D, B6, B1, zinc deficiencies  -Continue multivitamin supplement  -Continue thiamine(received IV replacement 3/4-3/9), vitamin B6, zinc supplements     Normocytic anemia  -Noted TSAT of 21%, ferritin 492, normal folate level on 3/10; normal vitamin B12 level 3/4  -cont ferrous sulfate  -Hb stable     Moderate  malnutrition  -Weight loss, was on Ozempic but discontinued  -Nutrition consult  -Currently on Remeron    Paroxysmal A-fib  -Increase metoprolol XL to 125 mg at evening from 100 mg  -Continue Eliquis  -Follow heart rate  -May need to add low-dose midodrine if BP running on low side with metoprolol     Urinary retension  -Currently has a Foley catheter, failed voiding trial on 3/13   -Consider another voiding trial early next week  -May consider adding Flomax but will hold off to avoid any significant hypotension including with increasing metoprolol XL     COPD--Not in exacerbation   -continue Symbicort  -prn duonebs    History of nonischemic cardiopathy  -Echo 12/31/2021 with EF 43%, mild global hypokinesis  -Noted per cardiology note 3/13 with no further plans for ischemic work-up as patient had cardiac catheterization in 2022 with no obstructive CAD--outpatient follow-up for nonischemic cardiomyopathy work-up  -Repeat echo as outpatient     Bipolar disorder  -Continue home meds including Seroquel, ropinirole; re-start home latuda 120mg  daily(pt has home med)     Diabetes--Hemoglobin A1c 5.3 on  3/6  -Patient had been on Ozempic but stopped due to malnutrition/weight loss  -No need for sliding-scale for now, can check as needed    Discussed with patient    Subjective     CC:  Myositis  HPI/Subjective: Patient seen and examined.  Patient feeling okay presently.  Patient denies any chest pain or palpitations.  No events overnight.    Review of Systems:   Review of Systems - Negative except as above in HPI    Physical Exam:     Temp:  [97.9 F (36.6 C)-98.6 F (37 C)] 97.9 F (36.6 C)  Heart Rate:  [100-121] 108  Resp Rate:  [18] 18  BP: (103-121)/(67-75) 110/69    Intake/Output Summary (Last 24 hours) at 01/08/2022 1151  Last data filed at 01/08/2022 1610  Gross per 24 hour   Intake 540 ml   Output 1150 ml   Net -610 ml      General: alert and awake, no distress  HEENT: anicteric, moist mucous membranes  Neck: supple, no JVD  CVS: nl S1, S2; no gallop or rub  Lungs: clear to auscultation b/l, no wheezing or rhonchi; normal resp effort  Abd: soft, non-tender, non-distended  Ext: no pedal edema, no cyanosis  Neuro: alert and awake, moving all extremities       Meds:   Medications were reviewed:  Scheduled Meds:  Current Facility-Administered Medications   Medication Dose Route Frequency    apixaban  5 mg Oral Q12H SCH    budesonide-formoterol  2 puff Inhalation BID    ferrous sulfate  324 mg Oral QAM W/BREAKFAST    lidocaine  1 patch Transdermal Q24H    lurasidone  120 mg Oral QPM    metoprolol succinate  100 mg Oral QHS    nystatin  500,000 Units Oral QID    pantoprazole  40 mg Oral QAM AC    potassium chloride  40 mEq Oral Daily    QUEtiapine  100 mg Oral QHS    rOPINIRole  1 mg Oral QHS    senna-docusate  2 tablet Oral QHS    thiamine  100 mg Oral Daily    vitamin B-6  50 mg Oral Daily    vitamin D  25 mcg Oral Daily    vitamins/minerals  1 tablet Oral Daily    zinc sulfate  220 mg Oral Daily     Continuous Infusions:  PRN Meds:.acetaminophen, albuterol-ipratropium, bisacodyl, diphenhydrAMINE, gabapentin, HYDROmorphone, lactulose, LORazepam, melatonin, promethazine  Labs/Radiology:   Imaging personally reviewed, including: all available   No results found.  No results for input(s):  GLUCOSEWB in the last 24 hours.  Recent Labs   Lab 01/06/22  0516 01/05/22  0654 01/03/22  0312   Sodium 136 130* 132*   Potassium 4.2 4.3 3.8   Chloride 106 104 101   BUN 10.0 10.0 12.0   Creatinine 0.6 0.6 0.6   EGFR >60.0 >60.0 >60.0   Glucose 88 107* 96   Calcium 9.4 9.2 9.1       Recent Labs   Lab 01/06/22  0516 01/05/22  0654 01/03/22  0312   WBC 9.34 11.07* 8.27   Hgb 9.1* 9.3* 8.6*   Hematocrit 28.4* 29.7* 26.6*   Platelets 490* 330 418*           Recent Labs   Lab 01/05/22  0654   Alkaline Phosphatase 65   Bilirubin, Total 0.4   ALT 34   AST (SGOT) 23  This note was generated by the Epic EMR system/ Dragon speech recognition and may contain inherent errors or omissions not intended by the user. Grammatical errors, random word insertions, deletions and pronoun errors  are occasional consequences of this technology due to software limitations. Not all errors are caught or corrected. If there are questions or concerns about the content of this note or information contained within the body of this dictation they should be addressed directly with the author for clarification.    Signed by: Gaetano Hawthorne, MD

## 2022-01-08 NOTE — Plan of Care (Signed)
Problem: Pain Management  Goal: LTG: Pt will achieve good pain control with current medication regimen as evidenced by ability to participate in 100% of therapy sessions.  Outcome: Progressing   C/o back pain controlled with PRN   Problem: Safety Risk  Goal: LTG: Pt will recognize limitations and use call bell 100% of the time prior to transfers in order to prevent fsalls  Outcome: Progressing   Call bell , bedside table and personal care items kept within reach. All yellow fall risk precautions maintained.

## 2022-01-08 NOTE — Plan of Care (Signed)
Problem: Bladder Management  Goal: LTG: Pt will be 100% continent and independent with bladder elim at [place]  Outcome: Progressing     Problem: Cardiac Function  Goal: LTG: Pt will be independent with BP and pulse management and medication at [place]  Outcome: Progressing     Problem: Pain Management  Goal: LTG: Pt will achieve good pain control with current medication regimen as evidenced by ability to participate in 100% of therapy sessions.  Outcome: Progressing     Problem: Safety Risk  Goal: LTG: Pt will recognize limitations and use call bell 100% of the time prior to transfers in order to prevent fsalls  Outcome: Progressing     Problem: Skin Wound Management  Goal: LTG: Excoriation at buttocks will resolve by [place]  Outcome: Progressing     Problem: Compromised Tissue integrity  Goal: Damaged tissue is healing and protected  Outcome: Progressing  Goal: Nutritional status is improving  Outcome: Progressing     Problem: Mobility  Goal: LTG: Patient will ambulate >150 feet at a Mod I level and navigate 1 FOS with CGA, both with LRAD in order to access the home and community environments.   Outcome: Progressing     Problem: Self Care Management  Goal: LTG: Pt will perform ADL routine at spv level with use of compensatory strategies and AE prn upon d/c.   Outcome: Progressing     Problem: Hemodynamic Status: Cardiac  Goal: Stable vital signs and fluid balance  Outcome: Progressing     Problem: Inadequate Tissue Perfusion  Goal: Adequate tissue perfusion will be maintained  Outcome: Progressing     Problem: Nutrition  Goal: Nutritional intake is adequate  Outcome: Progressing

## 2022-01-08 NOTE — PT Progress Note (Signed)
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  Pt agreeable to sitting EOB, BP noted to be stable although pt reports slight dizziness.  Pt willing to perform hip flexion and LAQ while seated EOB.  After 6 min sitting pt states her dizziness has increased and she needs to lie back down in bed.  At this time she tells this Clinical research associate that she was very surprised about today's therapy schedule, and that she has too much going on to be able to complete any additional activities.      Education Documentation  Rehab techniques/procedure, taught by Seabron Spates, PT at 01/08/2022  8:51 AM.  Learner: Patient  Readiness: Acceptance  Method: Explanation  Response: [name] Understanding, Needs Reinforcement    Plan of care, taught by Seabron Spates, PT at 01/08/2022  8:51 AM.  Learner: Patient  Readiness: Acceptance  Method: Explanation  Response: Verbalizes Understanding, Needs Reinforcement    Education Comments  No comments found.         Assessment & Plan   Despite education on benefits of OOB pt is insisting she heeds to take the day to  rest, as she has too much going on in her head to be able to focus on her physical rehabilitation at this time.  While pt does benefit from skilled therapy, she may not be appropriate for this level of care given her motivation and limited tolerance.      Plan:  Risks/Benefits/POC Discussed with Pt/Family: With patient  Treatment/Interventions: Gait training, Exercise, Stair training, Neuromuscular re-education, Functional transfer training, LE strengthening/ROM, Endurance training, Patient/family training, Equipment eval/education, Bed mobility, Compensatory technique education, Continued evaluation    Recommendation:  Discharge Recommendation: Home with home health PT  PT Therapy Recommendation: 5-6 days/week, 60-120 mins/day, 1:1 treatment  PT Estimated Length of Stay: 10-14 days from IE on 01/04/2022

## 2022-01-09 MED ORDER — ZINC SULFATE 220 (50 ZN) MG PO CAPS
220.0000 mg | ORAL_CAPSULE | Freq: Every day | ORAL | Status: DC
Start: 2022-01-10 — End: 2022-01-26
  Administered 2022-01-10 – 2022-01-26 (×17): 220 mg via ORAL
  Filled 2022-01-09 (×17): qty 1

## 2022-01-09 MED ORDER — VITAMINS/MINERALS PO TABS
1.0000 | ORAL_TABLET | Freq: Every day | ORAL | Status: DC
Start: 2022-01-10 — End: 2022-01-26
  Administered 2022-01-10 – 2022-01-26 (×17): 1 via ORAL
  Filled 2022-01-09 (×17): qty 1

## 2022-01-09 NOTE — SLP Eval Note (Signed)
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 year old right hand-dominant female   hx afib on Eliquis, HTN, HLD, DM, COPD, TIAs, COPD, bipolar disorder, [facility] admit 2/7-2/17/23 for rhabdomyolysis (peak CK 4464 - statin d/c'ed) + vomiting ?gastroparesis + starvation ketoacidosis + UTI, [facility] admit 2/18-2/20/23 for rhabdomyolysis (peak CK 2509) + transaminitis + bilateral leg weakness + right paresthesias, [facility] ER visit 3/2 for bilateral leg weakness who presents to [facility] with difficulty standing and walking, bilateral leg/hand weakness, bilateral leg/hand/face paresthesias. She noted bilateral leg weakness with her prior [facility] admission 2/7-2/17 and was readmitted for bilateral leg weakness 2/18-2/20. The past 4 days she has had difficulty standing and walking. The past 4 days she notes new bilateral hand and face paresthesias and bilateral hand weakness. She has some shortness of breath but is speaking in full sentences. She went to [facility] ER 3/2 and was discharged. She saw her PMD who advised she go to an [facility]. At [facility] she got MRI brain and CTL-spine which did not show a cause for her symptoms. [facility] called neurology Dr. Francesco Sor who said to consider IVIG for 5 days, LP, EMG. The LP was done at [facility]. Dr. Francesco Sor advised transfer to [facility]. These symptoms are sudden  onset, moderate intensity, without alleviating factors.  Following admission, patient was seen by neurology consult who performed MRI of the entire spine and brain, without contrast which came back to show no major issues except on the lumbar area L4-L5 disc disease with facet arthrosis and degenerative changes, neurology team recommended LP and CSF studies so far are unrevealing, patient continued to complain of having bandlike pain around the epigastric area & in the back, there was tenderness on the right side of the thoracic vertebral area in the muscle, Lidoderm patch applied, patient is getting Protonix and Maalox, has not had a bowel movement for that reason given lactulose and stool softeners.  Patient was [facility] from [facility] for consideration of IVIG with a suspicion of GBS, EMG currently pending.   Due to her history of not eating well and has been losing weight over the past few weeks, neurology team felt may be nutritional and started on on IV thiamine, patient has been requesting narcotics for the pain that she is got but not really having any tenderness on examination.  Patient did NIF and vital capacity on the morning of 12/26/2021 which came back -12 cm H2O and vital capacity 1.5 L.  Patient is not in any respiratory distress, not needing any oxygen at this time.  Patient had additional laboratory studies including HIV and syphilis which came back negative, negative for COVID 19, potassium was noted to be low for which replacement was given, CPK slightly elevated at 239.  Cultures remain negative.   Patient is A&O X  4 and following commands, she is tolerating a regular diet, last BM was 3/11. No hearing problems, wears glasses for reading. Lives with daughter is usually able to walk around the house, uses a RW for community level ambulation, patient usually requires help from her daughter for LE bathing but otherwise independent. Patient with IJ Line which was placed for PLEX, however no  longer going to have PLEX so it will be discontinued. Patient is working with PT and OT and would benefit from inpatient AR to get her as close to her pre-morbid status as possible and to manage his co-morbid conditions as above.      Patient was admitted to acute rehab on 01/03/2022     Past Medical History:   Past Medical History:   Diagnosis Date    Anemia     Asthma     Atrial fibrillation     Chronic obstructive pulmonary disease     Convulsions     Depression     Diabetes mellitus     diet controlled     Hyperlipidemia     Hypertension     TIA (transient ischemic attack)        Relevant Speech Therapy History  Speech Therapy History Comments: N/A  Results of Previous Instrumental Swallow Study: N/A  Premorbid Swallowing Function/Diet Textures: regular/thin    Subjective   Patient Report: "I just feel like my mind sometimes gets in the way and my tongue is really heavy."  Patient/Caregiver Goals: I want to be independent  Prior Level of Function: Lived with daughter, daughter assisted with medication and money management, cooking.  Daughter reported decline in speech intelligibility and mental clarity over the last 6 months.  Pain Assessment:  Pain Assessment: No/denies pain    Objective   Vitals: N/A      Interventions: Assessment completed on this date.  Discussed POC and rationale for 3/xweek due to necessity of PT and OT to reduce burden of care with daughter at home.  See results below.     Hearing, Speech, and Vision:  Ability to Hear: Adequate  Expression of Ideas and Wants: Some difficulty  Understanding Verbal and Non-Verbal Content: Understands     Health Literacy:  How often do you need to have someone help you when you read instructions, pamphlets, or other written material from your doctor or pharmacy?: Always    Evaluation:  Language/Cognition/Swallow:   Oral/Motor  Labial ROM: Within Functional Limits  Lingual ROM: Within Functional Limits  Lingual Symmetry: Abnormal symmetry right  Facial ROM:  Within Functional Limits  Facial Symmetry: Left droop  Facial Sensation: Within Functional Limits  Vocal Quality: Within Functional Limits  Vocal Intensity: Mildly decreased  Intelligibility: Intelligibility reduced  Intelligibility Rating: 51%-80%  Breath Support: Adequate for speech  Dentition: Dentures top (edentulous on bottom)  Hearing: Within Functional Limits    Deglutition Skills  Position: upright 90 degrees  Food(s) Tested: thin liquid (single cup sips and 3 oz water test)  Oral Stage: adequate  Pharyngeal Stage: adequate        Cognition  Oriented to: Self, Place, Situation  Sustained Attention: Mildly impaired  Processing: Mildly impaired (Impacted based on internal distractions and mental frame of mind as per patient)  Short Term Memory: Moderately impaired  Long Term Memory: WFL  Problem Solving/Reasoning: Moderately impaired (Impaired reasoning noted with PT and OT during sequencing of functional tasks, daughter reported fluctuations in cognition at home prior to hospitalization)  Awareness/Judgment: [facility] (Aware of physical  and cognitive deficits evidenced by frustration with intelligibility breakdowns and challenges with thought generation)    Motor Speech Intelligibility  Word: [facility]  Sentence: Mildly impaired  Automatic Speech: WFL  Conversational Discourse: Mild-moderately impaired (75% intelligible depending on fluctuations in vocal intensity and reduced articulatory imprecision.  Patient able to reduce rate and over articulate syllables with listener request clarification.  Daughter reports 75% intelligibility at home/via phone)    Auditory Comprehension  1-Step Command: [facility]    Reading Comprehension  Functional Reading Task: Mildly impaired  Irregularly Formatted Information: WFL    Verbal Expression  Confrontational Naming: WFL (PNT: 29/30)  Discourse Level: Relatively fluent provided additional time for processing and thought generation due cognitive impairments    Pragmatic Language  Other:  WFL    Written Expression  Other (comment): WFL (Right hand dominant, further assessment warranted)                 Assessments:    [place] Naming Test: 29/30       01/09/22 0800   CPIB: Condition interferes with:   Talking with people you know 0   Communicating when you need to say something quickly 0   Talking with people you do NOT know 0   Communicating when you are out in your community (e.g., errands, appointments) 1   Asking questions in conversation 2   Communicating in a small group of people 3   Having a long conversation with someone you know about a book, movie, show, or sports event 0   Giving someone DETAILED information 1   Getting your turn in a fast-moving conversation 2   Trying to persuade a friend or family member to see a different point of view 2   Raw score 11     T-score : 42.2 (mean : 50 SD 10)    EVERYDAY MEMORY QUESTIONNAIRE - REVISED (EMQ-R)  0 = ONCE OR LESS IN THE LAST MONTH   1 = MORE THAN ONCE A MONTH BUT LESS THAN ONCE A WEEK   2 = ABOUT ONCE A WEEK   3 = MORE THAN ONCE A WEEK OR LESS THAN ONCE A DAY   4 = ONCE OR MORE IN A DAY    Having to check whether you have done something that you should have done. 0  Forgetting when it was that something happened; for example, whether it was yesterday or last week. 0  Forgetting that you were told something yesterday or a few days ago, and maybe having to be reminded about it. 1  Starting to read something (a book or an article in a newspaper, or a magazine) without realizing you have already read it before.  0  Finding that a word is on the tip of your tongue. You know what it is but cannot quite find it. 1  Completely forgetting to do things you said you would do, and things you planned to do. 0  Forgetting important details of what you did or what happened to you the day before. 0  When talking to someone, forgetting what you have just said. Maybe saying 'what was I talking about?' 1  When reading a newspaper or magazine, being unable  to follow the thread of a story and losing track of what it is about. 1  Forgetting to tell somebody something important, perhaps forgetting to pass on a message or remind someone of something. 0  Getting the details of what someone has told you mixed up  and confused. 0  Forgetting where things are normally kept or looking for them in the wrong place. 0  Repeating to someone what you have just told them or asking someone the same question twice. 0    Total: 4/52 (higher scores = more severe self-report of memory impairment)     Quality of Life in the Dysarthria Speaker:   Speech Characteristics: 29/40  Situational Difficulty: 26/40  Compensatory Strategies: 33/40  Perceived Reactions of Other: 24/40  Higher scores indicate increased difficulty    Encounter Problems       Encounter Problems (Active)       Memory       LTG:  Patient will demonstrate increased advocacy in compensatory cognitive strategies during family training sessions to improve recall and attention during structured tasks as reported in post task analysis in at least 1-2 sessions.       Start:  01/09/22            STG:  Patient will implement cognitive strategies (reducing background noise, external aids, repetition) in order to participate in structured tasks evidenced via verbalization in at least 2/5 opportunities over the course of 2 sessions.        Start:  01/09/22    Expected End:  01/12/22               Speech/Language       LTG:  Patient will demonstrate improved speech intelligibility evidenced by communication partners reporting increase from 75% intelligibility and/or a 3-5 point increase on CPIB or QOL-dys.        Start:  01/09/22            STG:  Patient will demonstrate use of compensatory speech strategies (Be clear) during structured tasks at the sentence level to increase intelligibility to 80% accuracy provided cues no more than 50% of the time.        Start:  01/09/22    Expected End:  01/12/22                   Education  Documentation  Communication strategies, taught by Orvan Seen, SLP at 01/09/2022  2:21 PM.  Learner: Patient  Readiness: Acceptance  Method: Explanation, Teach Back  Response: Verbalizes Understanding, Needs Reinforcement, Able to Teach Back    Cognitive function, taught by Orvan Seen, SLP at 01/09/2022  2:21 PM.  Learner: Patient  Readiness: Acceptance  Method: Explanation, Teach Back  Response: Verbalizes Understanding, Needs Reinforcement, Able to Teach Back    Education Comments  No comments found.        Assessment & Plan   Assessment: Dysarthria, Decreased Attention, Decreased Memory  Mrs. Eisel presents to AR with suspected GBS and myositis.  Based on patient/family report and presentation at time of evaluation, patient presents with a mild dysarthria c/b reduced articulatory precision and fluctuating vocal intensity.  She is stimulable for strategy training given favorable response to listener request for clarification and implementation of Be Clear speech strategies.  Fluctuations in cognition noted as per family and patient report due to internal distraction and fatigue impacting attention and memory.  After discussion with patient/family endorsing increased need from a physical standpoint with PT and OT, recommend ST interventions 3x/week specifically focusing on compensatory strategy training with caregivers to modify environment and reduce burden of care considering current dx and fluctuating presentation.      Plan:  Risks/Benefits/POC Discussed with Pt/Family: With patient  Treatment/Interventions: Speech/Language evaluation, Speech/Language treatment, Cognitive linguistic retraining  Recommendation:  Discharge Recommendation: Home with supervision, Home with home health SLP  SLP Therapy Recommendation: 60-120 mins/day, 1:1 treatment, Group therapy (3x/week)  SLP Estimated Length of Stay: 7-10 days    Swallow Recommendations:   Diet Solids Recommendation: regular  Diet Liquids Recommendations:  thin consistency  Precautions/Compensations: Awake/alert, Upright 90 degrees for all oral intake  Recommended Form of Meds: PO, whole, with liquid    Groups appropriate for patient:   Communication Group, Cognition Group     Group Justification: Integrate and carry over functional social-pragmatic language skills through conversations with peers  Practice communication/functional cognitive skills in a setting that mimics a real world environment through conversations with peers  Practice /carry over skills learned in individual sessions with new partners, clinicians, and context  Practice strategies to manage communication/cognitive challenges in a distracting environment  Encouraging receiving and providing feedback to peers for enhanced skill development

## 2022-01-09 NOTE — PT Progress Note (Signed)
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  Patient/Caregiver Goals: I want to be independent  Pain Assessment:  Pain Assessment: No/denies pain    Objective   Vitals:  Heart Rate: (!) 117  Heart Rate Source: Monitor  Pulse (SpO2): 100  BP: 124/79  BP Location: Right arm  BP Method: Automatic  Patient Position: Sitting    Interventions: Requires Min A for SPT from bed to W/C. Min / Mod A for STS training from W/C in PT gym. Multiple rest breaks needed in between sets 2/2 fatigue and apprehension. Competes Seated there-ex (knee extension with 2# ankle weights) 3x15. Min A for SPT back to W/C at conclusion of session.    Evaluation:                  Assessments:      Education Documentation  Functional transfers/mobility, taught by Vevelyn Pat, PT at 01/09/2022  1:07 PM.  Learner: Patient  Readiness: Acceptance  Method: Explanation, Demonstration  Response: [name] Understanding, Demonstrated Understanding    ADL retraining, taught by Vevelyn Pat, PT at 01/09/2022  1:07 PM.  Learner: Patient  Readiness: Acceptance  Method: Explanation, Demonstration  Response: Verbalizes Understanding, Demonstrated Understanding    Education Comments  No comments found.         Assessment & Plan   Pt able to complete session today  despite early feelings of nausea at beginning of her session. Min A required for SPT 2/2 B/L leg weakness. She requires Min / Mod A at her waist (depending on fatigue level) for repeated STS from chair. She demonstrates deconditioning with need for extended rest breaks during STS practice. She will continue to benefit from skilled inpatient therapy in order to improve function and LE strength to make transfers safer for her and her caregivers in preparation for a safe D/C.    Plan:  Risks/Benefits/POC Discussed with Pt/Family: With patient  Treatment/Interventions: Gait training, Exercise, Stair training, Neuromuscular re-education, Functional transfer training, LE strengthening/ROM, Endurance training, Patient/family training, Equipment eval/education, Bed mobility, Compensatory technique education, Continued evaluation    Recommendation:  Discharge Recommendation: Home with home health PT  PT Therapy Recommendation: 5-6 days/week, 60-120 mins/day, 1:1 treatment  PT Estimated Length of Stay: 10-14 days from IE on 01/04/2022

## 2022-01-09 NOTE — Plan of Care (Signed)
Patient is alert with intermittent confusion, reports pain was effectively managed with PRN medication. No signs of discomfort noted on assessment. Will continue to monitor      Problem: Cardiac Function  Goal: LTG: Pt will be independent with BP and pulse management and medication at [place]  Outcome: Progressing     Problem: Pain Management  Goal: LTG: Pt will achieve good pain control with current medication regimen as evidenced by ability to participate in 100% of therapy sessions.  Outcome: Progressing     Problem: Safety Risk  Goal: LTG: Pt will recognize limitations and use call bell 100% of the time prior to transfers in order to prevent fsalls  Outcome: Progressing     Problem: Skin Wound Management  Goal: LTG: Excoriation at buttocks will resolve by [place]  Outcome: Progressing     Problem: Mobility  Goal: LTG: Patient will ambulate >150 feet at a Mod I level and navigate 1 FOS with CGA, both with LRAD in order to access the home and community environments.   Outcome: Progressing

## 2022-01-09 NOTE — Plan of Care (Signed)
Problem: Safety Risk  Goal: LTG: Pt will recognize limitations and use call bell 100% of the time prior to transfers in order to prevent fsalls  Outcome: Progressing  Note: Discussed safety measures with patient. Making sure wheelchair is locked when needed. Keeping pathways clear of clutter to help prevent falls.

## 2022-01-09 NOTE — Progress Notes (Signed)
MEDICINE PROGRESS NOTE  [facility], DIVISION OF HOSPITALIST MEDICINE   [facility]   [place] Pager: [phone]      Date Time: 01/09/22 12:54 PM  Patient Name: [name]  Attending Physician: Anastasia Pall, [facility] Day: 7  Assessment:     Active Hospital Problems    Diagnosis    Myositis     66 year old female with past medical history significant for diabetes, hypertension, nonischemic cardiomyopathy, paroxysmal A-fib, anemia, bipolar,, TIAs, patient presented to [facility] hospital on 2/7 with lower extremity weakness, paresthesias, transaminitis thought to have 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  Patient was subsequent discharged on 2/17 will need to go back to the hospital on 2/18 for continued management of rhabdomyolysis.  Patient noted to have left-sided weakness with associated difficulty with walking, standing, also developing bilateral hand and feet paresthesias and hand weakness she subsequent presented [facility] and was seen by neurology, work-up including MRIs, lumbar puncture done with no clear etiology subsequent with transfer to [facility] for consideration for IVIG.  While at [facility] first dose of IVIG was started outpatient setting developed allergic reaction therefore discontinued.  Diagnosis at time of discharge unclear include possible statin induced myositis versus inflammatory myositis versus immune mediated myositis.  She was noted to have multiple vitamin deficiencies including D, zinc, B6, iron deficiency which was treated.  Patient was discharged to acute rehab on 3/14.    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 and less likely to suggest autoimmune process   -Multiple vitamin deficiencies including vitamin D, zinc, B6, iron which were replaced  -PT/OT  -Avoid statins  -May need  muscle biopsy?; extensive work-up nonrevealing  -Negative paraneoplastic autoantibody from 3/7     Vitamin D, B6, B1, zinc deficiencies  -Continue multivitamin supplement  -Continue thiamine(received IV replacement 3/4-3/9), vitamin B6, zinc supplements     Normocytic anemia  -Noted TSAT of 21%, ferritin 492, normal folate level on 3/10; normal vitamin B12 level 3/4  -cont ferrous sulfate  -Hb stable     Moderate  malnutrition  -Weight loss, was on Ozempic but discontinued  -Nutrition consult  -Currently on Remeron    Paroxysmal A-fib--currently in sinus  -cont metoprolol XL 125 mg in evening (increased from 100 mg on 3/17)  -Continue Eliquis  -Follow heart rate  -May need to add low-dose midodrine if BP running on low side with metoprolol     Urinary retension  -Currently has a Foley catheter, failed voiding trial on 3/13   -May consider another voiding trial later this week  -May consider adding Flomax but will hold off to avoid any significant hypotension including with recent increase in metoprolol XL     COPD--Not in exacerbation   -continue Symbicort  -prn duonebs    History of nonischemic cardiopathy  -Echo 12/31/2021 with EF 43%, mild global hypokinesis  -Noted per cardiology note 3/13 with no further plans for ischemic work-up as patient had cardiac catheterization in 2022 with no obstructive CAD--outpatient follow-up for nonischemic cardiomyopathy work-up  -Repeat echo as outpatient     Bipolar disorder  -Continue home meds including Seroquel, ropinirole; home latuda 120mg  daily(pt has home med)  Diabetes--Hemoglobin A1c 5.3 on 3/6  -Patient had been on Ozempic but stopped due to malnutrition/weight loss  -No need for sliding-scale for now    Discussed with patient    Subjective     CC:  Myositis  HPI/Subjective: Patient seen and examined.  Patient feeling okay presently.  Patient noted with some tachycardia with therapy.  Patient does report significant anxiety including while working with therapy.    Review of Systems:   Review of Systems - Negative except as above in HPI    Physical Exam:     Temp:  [97.7 F (36.5 C)-98.1 F (36.7 C)] 97.9 F (36.6 C)  Heart Rate:  [104-125] 118  Resp Rate:  [16-20] 18  BP: (93-126)/(67-85) 121/83    Intake/Output Summary (Last 24 hours) at 01/09/2022 1254  Last data filed at 01/09/2022 6948  Gross per 24 hour   Intake 900 ml   Output 500 ml   Net 400 ml      General: alert and awake, no distress  HEENT: anicteric, moist mucous membranes  Neck: supple, no JVD  CVS: nl S1, S2; no gallop or rub  Lungs: clear to auscultation b/l, no wheezing or rhonchi; normal resp effort  Abd: soft, non-tender, non-distended  Ext: no pedal edema, no cyanosis  Neuro: alert and awake, moving all extremities       Meds:   Medications were reviewed:  Scheduled Meds:  Current Facility-Administered Medications   Medication Dose Route Frequency    apixaban  5 mg Oral Q12H SCH    budesonide-formoterol  2 puff Inhalation BID    ferrous sulfate  324 mg Oral QAM W/BREAKFAST    lidocaine  1 patch Transdermal Q24H    lurasidone  120 mg Oral QPM    metoprolol succinate  125 mg Oral QHS    nystatin  500,000 Units Oral QID    pantoprazole  40 mg Oral QAM AC    potassium chloride  40 mEq Oral Daily    QUEtiapine  100 mg Oral QHS    rOPINIRole  1 mg Oral QHS    senna-docusate  2 tablet Oral QHS    thiamine  100 mg Oral Daily    vitamin B-6  50 mg Oral Daily    vitamin D  25 mcg Oral Daily    [START ON 01/10/2022] vitamins/minerals  1 tablet Oral Daily at 1700    [START ON 01/10/2022] zinc sulfate  220 mg Oral Daily at 1700     Continuous Infusions:  PRN Meds:.acetaminophen, albuterol-ipratropium, bisacodyl, diphenhydrAMINE, gabapentin, HYDROmorphone, lactulose, LORazepam, melatonin,  promethazine  Labs/Radiology:   Imaging personally reviewed, including: all available   No results found.  No results for input(s): GLUCOSEWB in the last 24 hours.  Recent Labs   Lab 01/06/22  0516 01/05/22  0654 01/03/22  0312   Sodium 136 130* 132*   Potassium 4.2 4.3 3.8   Chloride 106 104 101   BUN 10.0 10.0 12.0   Creatinine 0.6 0.6 0.6   EGFR >60.0 >60.0 >60.0   Glucose 88 107* 96   Calcium 9.4 9.2 9.1       Recent Labs   Lab 01/06/22  0516 01/05/22  0654 01/03/22  0312   WBC 9.34 11.07* 8.27   Hgb 9.1* 9.3* 8.6*   Hematocrit 28.4* 29.7* 26.6*   Platelets 490* 330 418*           Recent Labs   Lab 01/05/22  0654   Alkaline Phosphatase  65   Bilirubin, Total 0.4   ALT 34   AST (SGOT) 23         This note was generated by the Epic EMR system/ Dragon speech recognition and may contain inherent errors or omissions not intended by the user. Grammatical errors, random word insertions, deletions and pronoun errors  are occasional consequences of this technology due to software limitations. Not all errors are caught or corrected. If there are questions or concerns about the content of this note or information contained within the body of this dictation they should be addressed directly with the author for clarification.    Signed by: Ilda FoilJonathan M Eleonora Peeler, MD

## 2022-01-10 LAB — CBC
Absolute NRBC: 0 10*3/uL (ref 0.00–0.00)
Hematocrit: 30.8 % — ABNORMAL LOW (ref 34.7–43.7)
Hgb: 9.9 g/dL — ABNORMAL LOW (ref 11.4–14.8)
MCH: 25.8 pg (ref 25.1–33.5)
MCHC: 32.1 g/dL (ref 31.5–35.8)
MCV: 80.2 fL (ref 78.0–96.0)
MPV: 9.6 fL (ref 8.9–12.5)
Nucleated RBC: 0 /100 WBC (ref 0.0–0.0)
Platelets: 369 10*3/uL — ABNORMAL HIGH (ref 142–346)
RBC: 3.84 10*6/uL — ABNORMAL LOW (ref 3.90–5.10)
RDW: 18 % — ABNORMAL HIGH (ref 11–15)
WBC: 9.3 10*3/uL (ref 3.10–9.50)

## 2022-01-10 LAB — ENCEPHALOPATHY AUTOIMMUNE EVALUATION
AGNA-1 Antibody IFA: NEGATIVE
AMPA Receptor Antibody CBA: NEGATIVE
ANNA-1 (HU) Antibody IFA: NEGATIVE
ANNA-2 (RI) Antibody IFA: NEGATIVE
ANNA-3 Antibody IFA: NEGATIVE
Amphiphysin Antibody IFA: NEGATIVE
CASPR2 IgG CBA: NEGATIVE
CRMP-5 IgG IFA: NEGATIVE
DPPX Antibody IFA: NEGATIVE
GABA-B Receptor Antibody CBA: NEGATIVE
GAD65 Antibody Assay: 0.19 nmol/L — ABNORMAL HIGH (ref <=0.02)
GFAP IFA: NEGATIVE
IgLON5 IFA: NEGATIVE
LGI1 IgG CBA: NEGATIVE
NIF IFA: NEGATIVE
NMDA Receptor Antibody CBA: NEGATIVE
Neurochondrin IFA: NEGATIVE
PCA-1 (YO) Antibody IFA: NEGATIVE
PCA-2 Antibody IFA: NEGATIVE
PCA-Tr (DNER) Antibody IFA: NEGATIVE
Septin-7 IFA: NEGATIVE
mGLuR1 Antibody IFA: NEGATIVE

## 2022-01-10 LAB — BASIC METABOLIC PANEL
Anion Gap: 11 (ref 5.0–15.0)
BUN: 8 mg/dL (ref 7.0–21.0)
CO2: 14 mEq/L — ABNORMAL LOW (ref 17–29)
Calcium: 9.5 mg/dL (ref 8.5–10.5)
Chloride: 104 mEq/L (ref 99–111)
Creatinine: 0.7 mg/dL (ref 0.4–1.0)
Glucose: 88 mg/dL (ref 70–100)
Potassium: 4.9 mEq/L (ref 3.5–5.3)
Sodium: 129 mEq/L — ABNORMAL LOW (ref 135–145)

## 2022-01-10 LAB — GFR: EGFR: >60

## 2022-01-10 MED ORDER — MIDODRINE HCL 5 MG PO TABS
2.5000 mg | ORAL_TABLET | Freq: Two times a day (BID) | ORAL | Status: DC
Start: 2022-01-10 — End: 2022-01-11
  Administered 2022-01-10 – 2022-01-11 (×3): 2.5 mg via ORAL
  Filled 2022-01-10 (×3): qty 1

## 2022-01-10 NOTE — PT Progress Note (Signed)
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  Is very anxious about repeating this.  Asks, "Why do you ask me to do these things when I have a muscle injury and they are impossible?"  Patient/Caregiver Goals: I want to be independent  Pain Assessment:  Pain Assessment: No/denies pain    Objective   Vitals:  Heart Rate: (!) 101  BP: 123/78  Patient Position: Sitting    BP assessed in long sitting above at beginning of session;    In sitting EOB:    108/64    After supine exercises:  117/80, HR 103 bpm.     Interventions:   Pt received supine in bed, agreeable to participate despite reservations as noted above; RN "Karena Addison" ok'd treatment.  Pt lethargic in session, requiring multiple tapping cues for attention to task even in sitting.     - Patient completes AP and heel slides x10 reps each with min A initially in supine for muscle pumping for improved response to positional changes.  Wearing B TED hose, no abdominal binder this pm.   - Supine to sit:  independent.    - Seated exercises:  MIP, AP, LAQ.  X10 repetitions each.    - Sit to/from stand:  from EOB using rwx. X5 repetitions with CGA for safety, limited by fatigue and lightheadedness.    - Sitting unsupported  EOB for ~10 mins.    - Pt refuses transfer to w/c this pm d/t anxiety about BP issues, increasing lightheadedness and fatigue.    - Sit to supine:  min A for RLE.    - Agreeable to participate in supine LE strengthening for increased function with bed mobility and transfers: AP, HS, hip abd/add, SLR, SAQ, bent-knee fall out.  Min A provided to RLE, Min/CGA to LLE x10 repetitions each.  Attempted bridge with facilitation through B tibias for Wbing x3 repetitions.      Education provided in session regarding:  importance of muscle pumping in blood return and benefit of LE exercise prior to transfers; benefit of strength training with BLE weakness in recovery.      Left supine in hospital bed with all needs within reach, no complaints.  RN aware.          Education Documentation  Safety issues and interventions, taught by Keane Scrape, PT at 01/10/2022  1:58 PM.  Learner: Patient  Readiness: Acceptance  Method: Explanation, Demonstration  Response: [name] Understanding, Needs Reinforcement    Rehab techniques/procedure, taught by Keane Scrape, PT at 01/10/2022  1:58 PM.  Learner: Patient  Readiness: Acceptance  Method: Explanation, Demonstration  Response: Verbalizes Understanding, Needs Reinforcement    Functional transfers/mobility, taught by Keane Scrape,  PT at 01/10/2022  1:58 PM.  Learner: Patient  Readiness: Acceptance  Method: Explanation, Demonstration  Response: [name] Understanding, Needs Reinforcement    Fall prevention/balance training, taught by Keane Scrape, PT at 01/10/2022  1:58 PM.  Learner: Patient  Readiness: Acceptance  Method: Explanation, Demonstration  Response: Verbalizes Understanding, Needs Reinforcement    Education Comments  No comments found.         Assessment & Plan   Patient with tolerance of activities limited by lightheadedness, anxiety and fatigue, but able to participate in session with rest breaks.  BP decreases with sitting and with sit to stands but remains  in therapeutic range.  Decreased strength noted in R>L LE requiring assist for LE exercises and sit to supine. She will continue to benefit from skilled OPPT to promote safe return home and decrease caregiver burden.     Plan:  Risks/Benefits/POC Discussed with Pt/Family: With patient  Treatment/Interventions: Gait training, Exercise, Stair training, Neuromuscular re-education, Functional transfer training, LE strengthening/ROM, Endurance training, Patient/family training, Equipment eval/education, Bed mobility, Compensatory technique education, Continued evaluation    Recommendation:  Discharge Recommendation: Home with home health PT  PT Therapy Recommendation: 5-6 days/week, 60-120 mins/day, 1:1 treatment  PT Estimated Length of Stay: 10-14 days from IE on 01/04/2022

## 2022-01-10 NOTE — OT Progress Note (Addendum)
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]  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 Began session by donning BLE TED hose in preparation for OOB activity.     OT left room to retrieve Dynam[employer]. Upon return, patient lying with eyes rolled back into her head. Easily arousable with verbal cues and patient reporting that she feels like she passed out. Vital stable RN notified clearing patient for therapy despite feelings of blacking out.    Discussed patient's anxiety toward OOB activities due to fear of passing out. Led patient through relaxation technique and grounding technique to decrease anxiety. As patient transitioned to edge of bed, OT provided assistance with breathing techniques. Spoke to patient about advocating for herself and directing her own care when completing transfers to  increase sense of control as well as decrease anxiety.    She completed lateral scoots over to the wheelchair with CGA. Donned abdominal binder and monitored BP. Dependently transported patient to therapy gym where she practiced completion of "half stands" using BUE and min VC for positioning and technique. Overall CGA.     Transitioned to STS with rolling walker, which patient was able to complete with CGA. BP significantly low and patient symptomatic. See table above for values.     Patient lead through exercises while reclined back in chair with BP returning to normal levels. Direct handoff to PT.     Education Documentation  Muscle weakness, taught by Davonna Belling, OT at 01/10/2022  7:01 PM.  Learner: Patient  Readiness: Acceptance  Method: Explanation, Demonstration  Response: Verbalizes Understanding, Demonstrated Understanding, Needs Reinforcement    Emotional adjustments/depression, taught by Davonna Belling, OT at 01/10/2022  7:01 PM.  Learner: Patient  Readiness: Acceptance  Method: Explanation, Demonstration  Response: Verbalizes Understanding, Demonstrated Understanding, Needs Reinforcement    Reconditioning, taught by Davonna Belling, OT at 01/10/2022  7:01 PM.  Learner: Patient  Readiness: Acceptance  Method: Explanation, Demonstration  Response: Verbalizes Understanding, Demonstrated Understanding, Needs Reinforcement    Verbalize detrimental effects of bedrest/inactivity, taught by Davonna Belling, OT at 01/10/2022  7:01 PM.  Learner: Patient  Readiness: Acceptance  Method: Explanation, Demonstration  Response: Verbalizes Understanding, Demonstrated Understanding, Needs Reinforcement    Signs/symptoms associated with debility, taught by Davonna Belling, OT at 01/10/2022  7:01 PM.  Learner: Patient  Readiness: Acceptance  Method: Explanation, Demonstration  Response: Verbalizes Understanding, Demonstrated Understanding, Needs Reinforcement    Safety issues and interventions, taught by Davonna Belling,  OT at 01/10/2022  7:01 PM.  Learner: Patient  Readiness: Acceptance  Method: Explanation, Demonstration  Response: Verbalizes Understanding, Demonstrated Understanding, Needs Reinforcement    Rehab techniques/procedure, taught by Davonna Belling, OT at 01/10/2022  7:01 PM.  Learner: Patient  Readiness: Acceptance  Method: Explanation, Demonstration  Response: Verbalizes Understanding, Demonstrated Understanding, Needs Reinforcement    Precautions, taught by Davonna Belling, OT at 01/10/2022  7:01 PM.  Learner: Patient  Readiness: Acceptance  Method: Explanation, Demonstration  Response: Verbalizes Understanding, Demonstrated Understanding, Needs Reinforcement    Functional transfers/mobility, taught by Davonna Belling, OT at 01/10/2022  7:01 PM.  Learner: Patient  Readiness: Acceptance  Method: Explanation, Demonstration  Response: Verbalizes Understanding, Demonstrated Understanding, Needs Reinforcement    Fall prevention/balance training, taught by Davonna Belling, OT at 01/10/2022  7:01 PM.  Learner: Patient  Readiness: Acceptance  Method: Explanation, Demonstration  Response: Verbalizes Understanding, Demonstrated Understanding, Needs Reinforcement    Education Comments  No comments found.        Assessment & Plan   Patient's blood pressure continues to be a large barrier even with the use of TED hose and abdominal binder. She continues to have a men's anxiety regarding her orthostatic hypotension, and will frequently attempt to avoid therapy due to fear of passing out. Neuropsychology has been notified, and she may benefit from co-tx to maximize participation and confidence.      Plan:  Risks/Benefits/POC Discussed with Pt/Family: With patient  Treatment Interventions: ADL retraining, Functional transfer training, UE strengthening/ROM, Endurance training, Cognitive reorientation, Patient/Family training, Equipment eval/education, Neuro muscular reeducation, Fine motor coordination activities, Compensatory technique  education    Recommendation:  Discharge Recommendation: Home with home health OT, Home with home health PT  OT Therapy Recommendation: 5-6 days/week, 60-120 mins/day, 1:1 treatment, Group therapy  OT Estimated Length of Stay: 14 days

## 2022-01-10 NOTE — Progress Notes (Signed)
Updated clinical faxed to patient's [employer] @ [phone]    Grace Blight MSW  Acute Rehab Case Manager  [facility]   P: [phone]  [email or web address]

## 2022-01-10 NOTE — Plan of Care (Signed)
Problem: Bladder Management  Goal: LTG: Pt will be 100% continent and independent with bladder elim at [place]  Outcome: Progressing  Note: Pt continues with foley catheter at present.     Problem: Cardiac Function  Goal: LTG: Pt will be independent with BP and pulse management and medication at [place]  Outcome: Progressing  Note: Heart rate runs tachycardic, generally between 100-118. Pt on remote telemetry for monitoring and on medications to manage. Education regarding BP and pulse is ongoing with each med pass and pt needs continued reinforcement.     Problem: Pain Management  Goal: LTG: Pt will achieve good pain control with current medication regimen as evidenced by ability to participate in 100% of therapy sessions.  Outcome: Progressing  Note: Pain is a primary patient concern. She reports "ice cold feet" and general discomfort. Pt is on Lidoderm patch and prn tylenol and dilaudid.  She also may receive Ativan for anxiety. Constipation a concern and source of discomfort as well and pt on dulcolax tabe to promote evacuation and regularity. Taking phenergan for nausea prn.     Problem: Safety Risk  Goal: LTG: Pt will recognize limitations and use call bell 100% of the time prior to transfers in order to prevent fsalls  Outcome: Progressing  Note: Pt is at the green (low) risk level for falls. She uses her call bell appropriately and waits for help to arrive before proceeding.Needs nursing staff assistance with transfers due to leg weakness.     Problem: Skin Wound Management  Goal: LTG: Excoriation at buttocks will resolve by [place]  Outcome: Progressing  Note: Excoriation at buttocks that was present on admission is resolving. Remains on specialty bed.     Problem: Compromised Tissue integrity  Goal: Damaged tissue is healing and protected  Outcome: Progressing  Flowsheets (Taken 01/10/2022 1940)  Damaged tissue is healing and protected:   Monitor/assess Braden scale every shift   Reposition patient every 2 hours and as  needed unless able to reposition self   Increase activity as tolerated/progressive mobility   Relieve pressure to bony prominences for patients at moderate and high risk   Avoid shearing injuries   Keep intact skin clean and dry   Use bath wipes, not soap and water, for daily bathing   Use incontinence wipes for cleaning urine, stool and caustic drainage. Foley care as needed   Monitor external devices/tubes for correct placement to prevent pressure, friction and shearing   Encourage use of lotion/moisturizer on skin   Monitor patient's hygiene practices   Utilize specialty bed  Note: Pt shifts positions often but needs staff help with full turns. Excoriation at bottom is slowly resolving. Continues to use specialty bed.  Goal: Nutritional status is improving  Outcome: Progressing  Flowsheets (Taken 01/10/2022 1940)  Nutritional status is improving:   Assist patient with eating   Allow adequate time for meals  Note: PO Intake of meals and fluids is in qs amounts.

## 2022-01-10 NOTE — Progress Notes (Signed)
MEDICINE PROGRESS NOTE  [facility], DIVISION OF HOSPITALIST MEDICINE   [facility]   [place] Pager: [phone]      Date Time: 01/10/22 2:24 PM  Patient Name: [name]  Attending Physician: Anastasia Pall, [facility] Day[facility] 8  Assessment:     Active Hospital Problems    Diagnosis    Myositis     66 year old female with past medical history significant for diabetes, hypertension, nonischemic cardiomyopathy, paroxysmal A-fib, anemia, bipolar, TIAs, patient presented to [facility] hospital on 2/7 with lower extremity weakness, paresthesias, transaminitis thought to have 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  Patient was subsequent discharged on 2/17 will need to go back to the hospital on 2/18 for continued management of rhabdomyolysis.  Patient noted to have left-sided weakness with associated difficulty with walking, standing, also developing bilateral hand and feet paresthesias and hand weakness she subsequent presented [facility] and was seen by neurology, work-up including MRIs, lumbar puncture done with no clear etiology subsequent with transfer to [facility] for consideration for IVIG.  While at [facility] first dose of IVIG was started outpatient setting developed allergic reaction therefore discontinued.  Diagnosis at time of discharge unclear include possible statin induced myositis versus inflammatory myositis versus immune mediated myositis.  She was noted to have multiple vitamin deficiencies including D, zinc, B6, iron deficiency which was treated.  Patient was discharged to acute rehab on 3/14.    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 and less likely to suggest autoimmune process   -Multiple vitamin deficiencies including vitamin D, zinc, B6, iron which were replaced  -PT/OT  -Avoid statins  -May need  muscle biopsy?; extensive work-up nonrevealing  -Negative paraneoplastic autoantibody from 3/7     Vitamin D, B6, B1, zinc deficiencies  -Continue multivitamin supplement  -Continue thiamine(received IV replacement 3/4-3/9), vitamin B6, zinc supplements     Normocytic anemia  -Noted TSAT of 21%, ferritin 492, normal folate level on 3/10; normal vitamin B12 level 3/4  -cont ferrous sulfate  -Hb has been stable     Moderate malnutrition  -Weight loss, was on Ozempic but discontinued  -Nutrition consult  -Currently on Remeron    Paroxysmal A-fib--currently in sinus  -cont metoprolol XL 125 mg in evening (increased from 100 mg on 3/17)  -Continue Eliquis  -Follow heart rate    Orthostatic hypotension  -add midodrine 2.5 mg in am and afternoon for now (continuing metoprolol with tachycardia and PAF)  -follow BP     Urinary retension  -Currently has a Foley catheter, failed voiding trial on 3/13   -May consider another voiding trial later this week  -May consider adding Flomax but will hold off to avoid any significant hypotension including with recent increase in metoprolol XL     COPD--Not in exacerbation   -continue Symbicort  -prn duonebs    History of nonischemic cardiopathy  -Echo 12/31/2021 with EF 43%, mild global hypokinesis  -Noted per cardiology note 3/13 with no further plans for ischemic work-up as patient had cardiac catheterization in 2022 with no obstructive CAD--outpatient follow-up for nonischemic cardiomyopathy work-up  -Repeat echo as outpatient     Bipolar disorder  -Continue home meds including  Seroquel, ropinirole; home latuda 120mg  daily(pt has home med)     Diabetes--Hemoglobin A1c 5.3 on 3/6  -Patient had been on Ozempic but stopped due to malnutrition/weight loss  -No need for sliding-scale for  now    Discussed with patient    Subjective     CC: Myositis  HPI/Subjective: Patient seen and examined.  Patient noted with orthostatic hypotension with SBP dropping to 70s earlier today while working with therapy.  Patient feeling okay presently denies any lightheadedness or dizziness.  Patient denies any chest pain or shortness of breath.    Review of Systems:   Review of Systems - Negative except as above in HPI    Physical Exam:     Temp:  [97.5 F (36.4 C)-98.4 F (36.9 C)] 98.4 F (36.9 C)  Heart Rate:  [65-120] 101  Resp Rate:  [18-20] 20  BP: (78-132)/(49-81) 123/78    Intake/Output Summary (Last 24 hours) at 01/10/2022 1424  Last data filed at 01/10/2022 [id number]  Gross per 24 hour   Intake 360 ml   Output 500 ml   Net -140 ml      General: alert and awake, no distress  HEENT: anicteric, moist mucous membranes  Neck: supple, no JVD  CVS: nl S1, S2; no gallop or rub  Lungs: clear to auscultation b/l, no wheezing or rhonchi; normal resp effort  Abd: soft, non-tender, non-distended  Ext: no pedal edema, no cyanosis  Neuro: alert and awake, moving all extremities       Meds:   Medications were reviewed:  Scheduled Meds:  Current Facility-Administered Medications   Medication Dose Route Frequency    apixaban  5 mg Oral Q12H SCH    budesonide-formoterol  2 puff Inhalation BID    ferrous sulfate  324 mg Oral QAM W/BREAKFAST    lidocaine  1 patch Transdermal Q24H    lurasidone  120 mg Oral QPM    metoprolol succinate  125 mg Oral QHS    nystatin  500,000 Units Oral QID    pantoprazole  40 mg Oral QAM AC    potassium chloride  40 mEq Oral Daily    QUEtiapine  100 mg Oral QHS    rOPINIRole  1 mg Oral QHS    senna-docusate  2 tablet Oral QHS    thiamine  100 mg Oral Daily    vitamin B-6  50 mg Oral Daily    vitamin D  25 mcg Oral Daily    vitamins/minerals  1 tablet Oral Daily at 1700    zinc sulfate  220 mg Oral Daily at 1700     Continuous Infusions:  PRN Meds:.acetaminophen, albuterol-ipratropium, bisacodyl,  diphenhydrAMINE, gabapentin, HYDROmorphone, lactulose, LORazepam, melatonin, promethazine  Labs/Radiology:   Imaging personally reviewed, including: all available   No results found.  No results for input(s): GLUCOSEWB in the last 24 hours.  Recent Labs   Lab 01/06/22  0516 01/05/22  0654   Sodium 136 130*   Potassium 4.2 4.3   Chloride 106 104   BUN 10.0 10.0   Creatinine 0.6 0.6   EGFR >60.0 >60.0   Glucose 88 107*   Calcium 9.4 9.2       Recent Labs   Lab 01/06/22  0516 01/05/22  0654   WBC 9.34 11.07*   Hgb 9.1* 9.3*   Hematocrit 28.4* 29.7*   Platelets 490* 330           Recent Labs   Lab 01/05/22  0654  Alkaline Phosphatase 65   Bilirubin, Total 0.4   ALT 34   AST (SGOT) 23         This note was generated by the Epic EMR system/ Dragon speech recognition and may contain inherent errors or omissions not intended by the user. Grammatical errors, random word insertions, deletions and pronoun errors  are occasional consequences of this technology due to software limitations. Not all errors are caught or corrected. If there are questions or concerns about the content of this note or information contained within the body of this dictation they should be addressed directly with the author for clarification.    Signed by: Ilda Foil, MD

## 2022-01-10 NOTE — Progress Notes (Signed)
Nutrition Support Services  [facility]   Main Office Telephone: [phone]    Nutrition Follow-up    [name] BirksCarol Echeverry 66 y.o. female   MRN: [id number]    Summary of Nutrition Recommendations:  RN documents weekly standing scale weight  -----------------------------------------------------------------------------------------------------------------                                                       ASSESSMENT DATA     Subjective Nutrition: For the last 2 days, I find myself eating more food.     Learning Needs: None    Events of Current Admission:  Pt appears more focused today. 25-50% po intake from flowsheet. Remeron was discontinued by MD. No current weight.    Medical Hx:  has a past medical history of Anemia, Asthma, Atrial fibrillation, Chronic obstructive pulmonary disease, Convulsions, Depression, Diabetes mellitus, Hyperlipidemia, Hypertension, and TIA (transient ischemic attack).     Active Hospital Problems    Diagnosis    Myositis       Orders Placed This Encounter   Procedures    Diet regular    Glucerna Shake (8 Oz Cans) Quantity: A. One; Frequency: Once With lunch    Glucerna Shake (8 Oz Cans) Quantity: A. One; Frequency: TID (3 times a day) with meals     ANTHROPOMETRIC  Height: 165.1 cm (5\' 5" )  Weight: 60.8 kg (134 lb 1.6 oz)    Body mass index is 22.32 kg/m.     ESTIMATED NEEDS    Total Daily Energy Needs: 1824 to 2128 kcal  Method for Calculating Energy Needs: 30 kcal - 35 kcal per kg  at 60.8 kg (Actual body weight)  Rationale: Malnutrition     Total Daily Protein Needs: 72.96 to 91.2 g  Method for Calculating Protein Needs: 1.2 g - 1.5 g per kg at 60.8 kg (0 body weight)       Total Daily Fluid Needs: 1824 ml  Method for Calculating Fluid Needs: ABW x 30 ml  Rationale: General guideline    Pertinent Medications:   IVF:    apixaban, 5 mg, Q12H SCH  budesonide-formoterol, 2 puff, BID  ferrous sulfate, 324 mg, QAM W/BREAKFAST  lidocaine, 1 patch, Q24H  lurasidone, 120 mg,  QPM  metoprolol succinate, 125 mg, QHS  nystatin, 500,000 Units, QID  pantoprazole, 40 mg, QAM AC  potassium chloride, 40 mEq, Daily  QUEtiapine, 100 mg, QHS  rOPINIRole, 1 mg, QHS  senna-docusate, 2 tablet, QHS  thiamine, 100 mg, Daily  vitamin B-6, 50 mg, Daily  vitamin D, 25 mcg, Daily  vitamins/minerals, 1 tablet, Daily at 1700  zinc sulfate, 220 mg, Daily at 1700        Pertinent labs:  Recent Labs   Lab 01/06/22  0516 01/05/22  0654   Sodium 136 130*   Potassium 4.2 4.3   Chloride 106 104   CO2 18 13*   BUN 10.0 10.0   Creatinine 0.6 0.6   Glucose 88 107*   Calcium 9.4 9.2   Magnesium  --  2.0   Phosphorus  --  4.2   EGFR >60.0 >60.0   WBC 9.34 11.07*   Hematocrit 28.4* 29.7*   Hgb 9.1* 9.3*  NUTRITION DIAGNOSIS     Inadequate oral intake improving  Severe malnutrition continues  Unintentional weight loss continues                                                           INTERVENTION     Nutrition recommendation - Please refer to top of note                                                       MONITORING/EVALUATION     Goals: Po intake increases to 755 or more to meet estimated needs    Monitor: weight, po intake and labs    Follow up:  01/21/2022    Lurline Hare MS. RD [phone]   01/10/2022 @ 1058

## 2022-01-10 NOTE — PT Progress Note (Signed)
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 Patient with TED hose and abdominal binder donned, and BP remained WNL throughout session (see flowsheet). Patient completed items in the CARE tool, as listed below. Additionally, patient completed TherEx in sitting for LE strengthening including LAQ w/ 1 lb ankle weights; marches w/ 1 lb ankle weights; and GTB clamshells. Patient tolerated 6 reps of GTB shoulder abduction before becoming nauseous.  She then had a small amount of vomit. Patient was made comfortable at the completion of the session in semi-reclined, with all needs met. Patient states that the nausea spell had subsided upon transferring back into bed.     Mobility Functional Status:   Current   Status Current Status Discharge Goal   Functional Area: Care Score:  Comments:    Roll Left and Right (P) 88 (P) Not attempted due to episode of patient nausea small amount of vomitting Independent   Sit to Lying (P) 4 (P) HOB elevated. Use of bedrails. Independent   Lying  to Sitting on Side of Bed (P) 4 (P) HOB elevated. Use of bedrails. Independent   Sit to Stand (P) 3 (P) With RW. Min A due to poor endurance and LE strength. VC to unlock knees Independent   Chair/Bed-to-Chair Transfer (P) 4 (P) Lateral scoot Independent   Car Transfer (P) 88 (P) Not attempted due to episode of patient nausea small amount of vomitting Independent   Walk 10 Feet (P) 88 (P) Not attempted due to episode of patient nausea small amount of vomitting Independent   Walk 50 Feet with Two Turns (P) 88 (P) Not attempted due to episode of patient nausea small amount of vomitting Independent   Walk 10 Feet on Uneven Surface (P) 88 (P) Not attempted due to episode of patient nausea small amount of vomitting Independent   Walk 150 Feet (P) 88 (P) Not attempted due to episode of patient nausea small amount of vomitting Independent   1 Step (Curb) (P) 88 (P) Not attempted due to episode of patient nausea small amount of vomitting Supervision or touching assistance   4 Steps (P) 88 (P) Not attempted due to episode of patient nausea small amount of vomitting Supervision or touching assistance   12 Steps (P) 88 (P) Not attempted due to episode of patient nausea small amount of vomitting Supervision or touching assistance   Wheel 50 Feet with Two Turns (P) 4 (P) UE propulsion Not applicable  Wheel 150 Feet (P) 4 (P) UE propulsion Not applicable         Encounter Problems       Encounter Problems (Active)       PT - General and Outcome Measures       STG: Patient will tolerate Berg Balance Assessment (Progressing)       Start:  01/04/22    Expected End:  01/11/22            STG: Patient will tolerate (Progressing)       Start:  01/04/22    Expected End:  01/11/22            STG: Patient will tolerate (Progressing)       Start:  01/04/22    Expected End:  01/11/22            STG: Patient will tolerate entire therapy session OOB. (Progressing)       Start:  01/04/22    Expected End:  01/11/22            STG:  Patient will tolerate a stair, curb and ramp assessment.  (Progressing)       Start:  01/04/22    Expected End:  01/11/22               PT - General and Outcome Measures       LTG: Patient will complete transfers from bed <> chair with recommended DME, independent assistance in order to maximize independence and prevent falls upon [place] home. (Progressing)       Start:  01/04/22            LTG: Patient will walk 150 ft with recommended DME, independent assistance in order to access areas of their home and community upon [place] home. (Progressing)       Start:  01/04/22            LTG: Patient will walk on uneven surfaces with recommended DME, independent assistance in order to access areas of their home and community upon [place] home. (Progressing)       Start:  01/04/22            LTG: Patient will ascend/descend 1 FOS with recommended DME, steadying assistance in order to safely and independently access their home/community (Progressing)       Start:  01/04/22            LTG: Patient/caregiver will return safe demonstration of proper supervision/assistance level for all mobility related needs in order to maximize safety upon transition to next level of care (Progressing)       Start:  01/04/22                   Education Documentation  Wheelchair management/mobility, taught by Marquis Buggy at 01/10/2022  4:20 PM.  Learner: Patient  Readiness: Acceptance  Method: Explanation  Response: Verbalizes Understanding    Safety issues and interventions, taught by Marquis Buggy at 01/10/2022  4:20 PM.  Learner: Patient  Readiness: Acceptance  Method: Explanation  Response: Verbalizes Understanding    Rehab techniques/procedure, taught by Marquis Buggy at 01/10/2022  4:20 PM.  Learner: Patient  Readiness: Acceptance  Method: Explanation  Response: Verbalizes Understanding    Precautions, taught by Marquis Buggy at 01/10/2022  4:20 PM.  Learner: Patient  Readiness: Acceptance  Method: Explanation  Response: Verbalizes  Understanding    Plan of care, taught by Marquis Buggy at 01/10/2022  4:20 PM.  Learner: Patient  Readiness: Acceptance  Method: Explanation  Response: Verbalizes Understanding    Pain management, taught by Marquis Buggy at 01/10/2022  4:20 PM.  Learner: Patient  Readiness: Acceptance  Method: Explanation  Response: Verbalizes Understanding    Home exercise program, taught by Marquis Buggy at 01/10/2022  4:20 PM.  Learner: Patient  Readiness: Acceptance  Method: Explanation  Response: Verbalizes Understanding    Functional transfers/mobility, taught by Marquis Buggy at 01/10/2022  4:20 PM.  Learner: Patient  Readiness: Acceptance  Method: Explanation  Response: Verbalizes Understanding    Fall prevention/balance training, taught by Marquis Buggy at 01/10/2022  4:20 PM.  Learner: Patient  Readiness: Acceptance  Method: Explanation  Response: Verbalizes Understanding    Equipment, taught by Marquis Buggy at 01/10/2022  4:20 PM.  Learner: Patient  Readiness: Acceptance  Method: Explanation  Response: Verbalizes Understanding    Cognitive function, taught by Marquis Buggy at 01/10/2022  4:20 PM.  Learner: Patient  Readiness: Acceptance  Method: Explanation  Response: Verbalizes Understanding    Bowel and bladder management, taught by Marquis Buggy at 01/10/2022  4:20 PM.  Learner: Patient  Readiness: Acceptance  Method: Explanation  Response: Verbalizes Understanding    ADL retraining, taught by Marquis Buggy at 01/10/2022  4:20 PM.  Learner: Patient  Readiness: Acceptance  Method: Explanation  Response: Verbalizes Understanding    Education Comments  No comments found.        Assessment & Plan   Patient continues to be limited by symptomatic syncopal episodes, N/V, and anxiety. Today, session limited due to N/V. Plan to incorporate weightbearing exercise as BP allows. Patient will benefit from skilled PT to address these deficits to decrease BOC upon [place] home.     Plan:  Risks/Benefits/POC Discussed with  Pt/Family: With patient  Treatment/Interventions: Gait training, Exercise, Stair training, Neuromuscular re-education, Functional transfer training, LE strengthening/ROM, Endurance training, Patient/family training, Equipment eval/education, Bed mobility, Compensatory technique education, Continued evaluation    Recommendation:  Discharge Recommendation: Home with home health PT  PT Therapy Recommendation: 5-6 days/week, 60-120 mins/day, 1:1 treatment  PT Estimated Length of Stay: 10-14 days from IE on 01/04/2022

## 2022-01-10 NOTE — Progress Notes (Signed)
Chaplain Service      Background:  Visit Type: Follow-up was made by Chaplain with patient, [name], based on Source: Chaplain Initiated.  Present at Visit: Patient.  Spiritual Care Provided to: patient.    .    Summary:  Spiritual Care Interventions: Performed Blessing, Provided prayer, Provided spiritual encouragement, Provided comfort, encouragement,affirmation  Reason for Visit: Spiritual Support   Spiritual Care Outcomes: Patient expressed appreciation of visit

## 2022-01-10 NOTE — OT Progress Note (Addendum)
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  Agreeable for OT session.  Pt at SPV-touch A for supine to sit EOB.  Engaged in scooting along EOB, partial stands, anterior weight shifts at CGA-mod A.  Spv for static sitting EOB.  BP taken during session with significant drop in BP from semi-supine to sit EOB.  Pt also falling asleep/less responsive during ankle pumps and B LE exercises.  Returned to supine with noted improved communication and arousal level.  Applied ace wraps on B LE's to INC BP in prep for next therapy session.  Pt at this time refused trial of sitting EOB.        Education Documentation  No documentation found.  Education Comments  No comments found.        Assessment & Plan   Pt limited with symptomatic orthostatic hypotension this session.  Pt also continues to present with decreased motivation and anxiety during ADL and mobility tasks resulting in refusal in certain tasks in therapy including transfer practice.  Pt still resistant to out of bed therapy despite relating her goal of wanting a shower to building up activity tolerance out of bed.  To discuss with  rehab team.    Plan:  Risks/Benefits/POC Discussed with Pt/Family: With patient  Treatment Interventions: ADL retraining, Functional transfer training, UE strengthening/ROM, Endurance training, Cognitive reorientation, Patient/Family training, Equipment eval/education, Neuro muscular reeducation, Fine motor coordination activities, Compensatory technique education    Recommendation:  Discharge Recommendation: Home with home health OT, Home with home health PT  OT Therapy Recommendation: 5-6 days/week, 60-120 mins/day, 1:1 treatment, Group therapy  OT Estimated Length of Stay: 14 days

## 2022-01-10 NOTE — Plan of Care (Signed)
Problem: Bladder Management  Goal: LTG: Pt will be 100% continent and independent with bladder elim at [place]  Outcome: Progressing     Problem: Cardiac Function  Goal: LTG: Pt will be independent with BP and pulse management and medication at [place]  Outcome: Progressing     Problem: Pain Management  Goal: LTG: Pt will achieve good pain control with current medication regimen as evidenced by ability to participate in 100% of therapy sessions.  Outcome: Progressing     Problem: Safety Risk  Goal: LTG: Pt will recognize limitations and use call bell 100% of the time prior to transfers in order to prevent fsalls  Outcome: Progressing

## 2022-01-10 NOTE — Progress Notes (Signed)
PHYSICAL MEDICINE AND REHABILITATION  PROGRESS NOTE  FACE TO Bruce Donath    Date Time: 01/10/22 2:55 PM    Patient Name: DanielleFatimah  Admission date:  01/03/2022  (LOS: 7 days)    Subjective:     Patient seen this morning  Reports feeling better on latuda  Discussed with neuro-psychologist   Midodrine for orthostatism     Functional Status:     Patient with tolerance of activities limited by lightheadedness, anxiety and fatigue, but able to participate in session with rest breaks.  BP decreases with sitting and with sit to stands but remains in therapeutic range.  Decreased strength noted in R>L LE requiring assist for LE exercises and sit to supine. She will continue to benefit from skilled OPPT to promote safe return home and decrease caregiver burden.     Medications:   Medication reviewed by me:     Scheduled Meds: PRN Meds:    apixaban, 5 mg, Oral, Q12H SCH  budesonide-formoterol, 2 puff, Inhalation, BID  ferrous sulfate, 324 mg, Oral, QAM W/BREAKFAST  lidocaine, 1 patch, Transdermal, Q24H  lurasidone, 120 mg, Oral, QPM  metoprolol succinate, 125 mg, Oral, QHS  midodrine, 2.5 mg, Oral, BID Meals  nystatin, 500,000 Units, Oral, QID  pantoprazole, 40 mg, Oral, QAM AC  potassium chloride, 40 mEq, Oral, Daily  QUEtiapine, 100 mg, Oral, QHS  rOPINIRole, 1 mg, Oral, QHS  senna-docusate, 2 tablet, Oral, QHS  thiamine, 100 mg, Oral, Daily  vitamin B-6, 50 mg, Oral, Daily  vitamin D, 25 mcg, Oral, Daily  vitamins/minerals, 1 tablet, Oral, Daily at 1700  zinc sulfate, 220 mg, Oral, Daily at 1700        Continuous Infusions:   acetaminophen, 650 mg, Q4H PRN  albuterol-ipratropium, 3 mL, Q6H PRN  bisacodyl, 10 mg, Daily PRN  diphenhydrAMINE, 25 mg, Q6H PRN  gabapentin, 100 mg, TID PRN  HYDROmorphone, 2 mg, Q6H PRN  lactulose, 20 g, Q4H PRN  LORazepam, 0.5 mg, QHS PRN  melatonin, 3 mg, QHS PRN  promethazine, 25 mg, Q6H PRN          Medication Review  A complete drug regimen review was completed: Yes  Were drug issues were  found during review: No   If yes to drug issues found during review:  What was the issue?:   What was the time the issue was identified?:   Was I contacted and action was taken by midnight of the next calendar day once issue was identified?: N/A   Person who contacted me:   Action taken:     Review of Systems:   A comprehensive review of systems was: No fevers, chills, nausea, vomiting, chest pain, shortness of breath, cough, headache, double vision.  All others negative.    Labs:     Recent Labs   Lab 01/06/22  0516 01/05/22  0654   WBC 9.34 11.07*   Hgb 9.1* 9.3*   Hematocrit 28.4* 29.7*   Platelets 490* 330      Recent Labs   Lab 01/06/22  0516 01/05/22  0654   Sodium 136 130*   Potassium 4.2 4.3   Chloride 106 104   CO2 18 13*   BUN 10.0 10.0   Creatinine 0.6 0.6   Calcium 9.4 9.2   Albumin  --  3.2*   Protein, Total  --  6.6   Bilirubin, Total  --  0.4   Alkaline Phosphatase  --  65   ALT  --  34  AST (SGOT)  --  23   Glucose 88 107*   Magnesium  --  2.0   Phosphorus  --  4.2         Rads:   Radiological Procedure reviewed.  Radiology Results (24 Hour)       ** No results found for the last 24 hours. **          Physical Exam:     Vitals:    01/10/22 1114 01/10/22 1131 01/10/22 1149 01/10/22 1300   BP: 118/60 102/70 126/77 123/78   Pulse: 65 (!) 118 (!) 115 (!) 101   Resp:       Temp:       TempSrc:       SpO2: 97%  99%    Weight:       Height:           Intake and Output Summary (Last 24 hours) at Date Time    Intake/Output Summary (Last 24 hours) at 01/10/2022 1455  Last data filed at 01/10/2022 0509  Gross per 24 hour   Intake 360 ml   Output 500 ml   Net -140 ml     P.O.: 240 mL (01/09/22 2100)     Urine: 0 mL (01/09/22 2100)             Gen: Alert, NAD  Psych: Pleasant and cooperative  Skin: Pink, warm, no rashes  HEENT: Atraumatic, normocephalic,   Chest: symmetric bilateral chest rise and fall,   CV: Regular rate and rhythm, no murmurs  Resp: breath sounds equal throughout; ctax2  Abd: non-distended,  +BS  Extr: Pink, warm,  Neuro:  Moving all ext     Assessment and Plan:     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   Occupational Therapy focused on improving strength, endurance, transfers, activities of daily living, and cognition for 60-120 mins/day, 5-6 days/week, for 14 days.     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%   Metoprolol,   losartan held due to hypotension     # COPD  Symbicort      # bipolar disorder  Latuda   Seroquel   Remeron for sleep and appetite     # Pain Management   Daily monitoring   gabapentin  Acetaminophen, dilaudid PRN     #Bowel Management  constipation - Senna, Colace, Bisacodyl suppository PRN; hold for loose stools    #Bladder Management  Check PVRs    #Skin Management  Frequent skin assessment  Moisture barrier PRN    #FEN/GI with severe malnutrition   cardiac diet    #DVT PPx  eliquis    #Dispo  Pending rehab progress      Continue comprehensive and intensive inpatient rehab program, including:   Physical therapy 60-120 min daily, 5-6 times per week, Occupational therapy 60-120 min daily, 5-6 times per week, Case management and Rehabilitation nursing.  Psychology services to evaluate and treat as needed     Signed by: Gwyneth Revels  Jayme Cloud, MD   01/10/2022, 2:55 PM    [facility]

## 2022-01-11 ENCOUNTER — Inpatient Hospital Stay: Payer: 59

## 2022-01-11 DIAGNOSIS — M609 Myositis, unspecified: Principal | ICD-10-CM

## 2022-01-11 DIAGNOSIS — J45909 Unspecified asthma, uncomplicated: Secondary | ICD-10-CM

## 2022-01-11 MED ORDER — BISACODYL 10 MG RE SUPP
10.0000 mg | Freq: Every day | RECTAL | Status: DC | PRN
Start: 2022-01-11 — End: 2022-01-26
  Administered 2022-01-11 – 2022-01-18 (×2): 10 mg via RECTAL
  Filled 2022-01-11 (×2): qty 1

## 2022-01-11 MED ORDER — MIDODRINE HCL 5 MG PO TABS
5.0000 mg | ORAL_TABLET | Freq: Two times a day (BID) | ORAL | Status: DC
Start: 2022-01-12 — End: 2022-01-19
  Administered 2022-01-12 – 2022-01-19 (×16): 5 mg via ORAL
  Filled 2022-01-11 (×16): qty 1

## 2022-01-11 NOTE — Progress Notes (Signed)
MEDICINE PROGRESS NOTE  [facility], DIVISION OF HOSPITALIST MEDICINE   [facility]   [place] Pager: [phone]      Date Time: 01/11/22 10:21 AM  Patient Name: [name]  Attending Physician: Anastasia Pall, [facility]: 9  Assessment:     Active Hospital Problems    Diagnosis    Myositis     66 year old female with past medical history significant for diabetes, hypertension, nonischemic cardiomyopathy, paroxysmal A-fib, anemia, bipolar, TIAs, patient presented to [facility] hospital on 2/7 with lower extremity weakness, paresthesias, transaminitis thought to have 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  Patient was subsequent discharged on 2/17 will need to go back to the hospital on 2/18 for continued management of rhabdomyolysis.  Patient noted to have left-sided weakness with associated difficulty with walking, standing, also developing bilateral hand and feet paresthesias and hand weakness she subsequent presented [facility] and was seen by neurology, work-up including MRIs, lumbar puncture done with no clear etiology subsequent with transfer to [facility] for consideration for IVIG.  While at [facility] first dose of IVIG was started outpatient setting developed allergic reaction therefore discontinued.  Diagnosis at time of discharge unclear include possible statin induced myositis versus inflammatory myositis versus immune mediated myositis.  She was noted to have multiple vitamin deficiencies including D, zinc, B6, iron deficiency which was treated.  Patient was discharged to acute rehab on 3/14.    Plan:     Complains of shortness of breath/no hypoxia noted/no fever  -Chest x-ray was done today-no acute changes  -Patient subsequently felt better  -She states she has a history of asthma and has been using her inhaler.    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 and less likely to suggest autoimmune process   -Multiple vitamin deficiencies including vitamin D, zinc, B6, iron which were replaced  -PT/OT  -Avoid statins  -May need  muscle biopsy?; extensive work-up nonrevealing  -Negative paraneoplastic autoantibody from 3/7    Hyponatremia  Na 129     Vitamin D, B6, B1, zinc deficiencies  -Continue multivitamin supplement  -Continue thiamine(received IV replacement 3/4-3/9), vitamin B6, zinc supplements     Normocytic anemia  -Noted TSAT of 21%, ferritin 492, normal folate level on 3/10; normal vitamin B12 level 3/4  -cont ferrous sulfate  -Hb has been stable     Moderate protein calorie malnutrition  -Weight loss, was on Ozempic but discontinued  -Nutrition consult  -Currently on Remeron    Paroxysmal A-fib--currently in sinus  -cont metoprolol XL 125 mg in evening (increased from 100 mg on 3/17)  -Continue Eliquis  -Follow heart rate    Orthostatic hypotension  -Responding to midodrine 2.5 mg in am and afternoon for now (continuing metoprolol with tachycardia and PAF)  -Noted today's blood pressure seems to be more stable with systolic blood pressure 100-120s     Urinary retension  -Currently has a Foley catheter, failed voiding trial on 3/13   -Consider attempting another voiding trial this week  -May consider adding Flomax but will hold off to avoid any significant hypotension including with recent increase in metoprolol XL     COPD--Not in exacerbation   -continue Symbicort  -prn  duonebs    History of nonischemic cardiopathy  -Echo 12/31/2021 with EF 43%, mild global hypokinesis  -Noted per cardiology note 3/13 with no further plans for ischemic work-up as patient had cardiac catheterization in 2022 with no  obstructive CAD--outpatient follow-up for nonischemic cardiomyopathy work-up  -Repeat echo as outpatient     Bipolar disorder  -Continue home meds including Seroquel, ropinirole; home latuda 120mg  daily(pt has home med)     Diabetes--Hemoglobin A1c 5.3 on 3/6  -Patient had been on Ozempic but stopped due to malnutrition/weight loss  -No need for sliding-scale for now    Discussed with patient, RN    Subjective     CC: Myositis  Received message from RN that patient is having shortness of breath.  No wheezing.  Patient was placed on 2 L of oxygen, patient's oxygen saturations were 100%.  Patient denies any lightheadedness or dizziness.  Patient denies any chest pain or shortness of breath.  No cough    Review of Systems:   Review of Systems - Negative except as above in HPI    Physical Exam:     Temp:  [97.7 F (36.5 C)] 97.7 F (36.5 C)  Heart Rate:  [65-121] 120  Resp Rate:  [18] 18  BP: (78-132)/(49-85) 123/80    Intake/Output Summary (Last 24 hours) at 01/11/2022 1021  Last data filed at 01/11/2022 0600  Gross per 24 hour   Intake --   Output 1300 ml   Net -1300 ml      General: alert and awake, no distress  HEENT: anicteric, moist mucous membranes  Neck: supple, no JVD  CVS: nl S1, S2; no gallop or rub  Lungs: clear to auscultation b/l, no wheezing or rhonchi; normal resp effort  Abd: soft, non-tender, non-distended  Ext: no pedal edema, no cyanosis  Neuro: alert and awake, moving all extremities       Meds:   Medications were reviewed:  Scheduled Meds:  Current Facility-Administered Medications   Medication Dose Route Frequency    apixaban  5 mg Oral Q12H SCH    budesonide-formoterol  2 puff Inhalation BID    ferrous sulfate  324 mg Oral QAM W/BREAKFAST    lidocaine  1 patch Transdermal Q24H    lurasidone  120 mg Oral QPM    metoprolol succinate  125 mg Oral QHS    midodrine  2.5 mg Oral BID Meals    nystatin  500,000 Units Oral QID    pantoprazole  40 mg Oral QAM AC    potassium chloride  40 mEq Oral Daily     QUEtiapine  100 mg Oral QHS    rOPINIRole  1 mg Oral QHS    senna-docusate  2 tablet Oral QHS    thiamine  100 mg Oral Daily    vitamin B-6  50 mg Oral Daily    vitamin D  25 mcg Oral Daily    vitamins/minerals  1 tablet Oral Daily at 1700    zinc sulfate  220 mg Oral Daily at 1700     Continuous Infusions:  PRN Meds:.acetaminophen, albuterol-ipratropium, bisacodyl, diphenhydrAMINE, gabapentin, HYDROmorphone, lactulose, LORazepam, melatonin, promethazine  Labs/Radiology:   Imaging personally reviewed, including: all available   No results found.  No results for input(s): GLUCOSEWB in the last 24 hours.  Recent Labs   Lab 01/10/22  1519 01/06/22  0516 01/05/22  0654   Sodium 129* 136 130*   Potassium 4.9 4.2 4.3   Chloride 104 106 104  BUN 8.0 10.0 10.0   Creatinine 0.7 0.6 0.6   EGFR >60.0 >60.0 >60.0   Glucose 88 88 107*   Calcium 9.5 9.4 9.2       Recent Labs   Lab 01/10/22  1519 01/06/22  0516 01/05/22  0654   WBC 9.30 9.34 11.07*   Hgb 9.9* 9.1* 9.3*   Hematocrit 30.8* 28.4* 29.7*   Platelets 369* 490* 330           Recent Labs   Lab 01/05/22  0654   Alkaline Phosphatase 65   Bilirubin, Total 0.4   ALT 34   AST (SGOT) 23         This note was generated by the Epic EMR system/ Dragon speech recognition and may contain inherent errors or omissions not intended by the user. Grammatical errors, random word insertions, deletions and pronoun errors  are occasional consequences of this technology due to software limitations. Not all errors are caught or corrected. If there are questions or concerns about the content of this note or information contained within the body of this dictation they should be addressed directly with the author for clarification.    Signed by: Laretta BolsterKanchan Elanna Bert, MD

## 2022-01-11 NOTE — OT Progress Note (Signed)
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 Patient reporting that she would still enjoy an attempt using [facility] for safety.    Procured platform commode and provided CGA as patient transitioned to EOB. With VC, patient was able to completed lateral scoots from EOB>BSC with CGA. Guided patient through lateral leans to manage pants with overall min A and CGA to prevent sliding forward. Patient unable to complete BM. She used arm rests to push herself up slightly while OT assisted with pant management. She returned to EOB via lateral scoots with CGA.  BP slightly low and deemed unsafe for OOB therapy as patient tends to decrease when OOB.     She returned to semi supine with min A and completed UB exercises using 4lb weight. She completed 3 sets of 10 of overhead press, chest press, and bicep curls.     Assessment & Plan   Continue to be limited by BP which is somewhat moderated by TED hose and abdominal binder. Would like to  explore continued medical management to maximize patient's ability to participate in therapy session.     Plan:  Risks/Benefits/POC Discussed with Pt/Family: With patient  Treatment Interventions: ADL retraining, Functional transfer training, UE strengthening/ROM, Endurance training, Cognitive reorientation, Patient/Family training, Equipment eval/education, Neuro muscular reeducation, Fine motor coordination activities, Compensatory technique education    Recommendation:  Discharge Recommendation: Home with home health OT, Home with home health PT  OT Therapy Recommendation: 5-6 days/week, 60-120 mins/day, 1:1 treatment, Group therapy  OT Estimated Length of Stay: 14 days

## 2022-01-11 NOTE — Progress Notes (Signed)
Pt has not had a bowel movement charted since 01/02/22. Pt states she can not recall the last time she passed her bowels. Medicated with dulcolax tabs and lactulose but not results as of yet. Pt asked for coffee this morning. RN did a dry brush to abdomen clockwise to facilitate bowels. Bowel sounds are faint but present. Dry brush and hot coffee to assist with bowel movement.Results pending.

## 2022-01-11 NOTE — Care and Service Plan (Signed)
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]   Admit Date/Time:  01/03/2022 10:00 PM  Admission Comments: No comment available     Primary Diagnosis: Myositis    Patient Active Problem List    Diagnosis Date Noted    Myositis 01/03/2022    Hypokalemia 12/26/2021    Thoracic back pain 12/26/2021    Gastroparesis 12/26/2021    Weakness of both legs 12/25/2021    Guillain Barr syndrome 12/25/2021    History of asthma 06/16/2019    History of transient ischemic attack (TIA) 06/16/2019    Type 2 diabetes mellitus 06/16/2019    Syncope and collapse 06/16/2019    Left-sided weakness 05/20/2019    Chest pain with moderate risk for cardiac etiology 05/11/2019    Non-traumatic subcutaneous emphysema 01/07/2019    Chest pain with high risk of acute coronary syndrome 01/06/2019    Paroxysmal atrial fibrillation 01/06/2019    Hypertension 01/06/2019    Hyperlipidemia 01/06/2019    Seizure disorder 01/06/2019    History of gastrointestinal hemorrhage 01/06/2019    Depression 01/06/2019    Anemia 01/06/2019    Asthma 01/06/2019    Chest pain 08/22/2018    CVA (cerebral vascular accident) 08/21/2018       Vital Signs  Blood Pressure: 123/79  Temperature: 98 F (36.7 C)  Pulse: (!) 111  Respirations: 18  Pain Scale Used: Numeric Scale (0-10)  Pain Score: 2  Pain Location: Back  Pain Orientation: Lower  Pain Descriptors: Aching  Pain Frequency: Continuous      Weight and Nutrition  Admission Weight: 60.8 kg (134 lb 1.6 oz)  Current Weight: 60.8 kg (134 lb 1.6 oz)  Diet Type: Consistent Carbohydrate    Plan of Care  Anticipated Discharge Date: Jan 25, 2022  Discharge Plan: Daughters home  Fall Risk Level: Yellow  Is the duration of therapy 15 hours over 7 days instead of the standard 3 hours of therapy, 5 out of 7 days a week?: No  The patient  will benefit from continued services by:   Physical Therapy Recommendation: 5-6 days/week, 60-120 mins/day, 1:1 treatment  Occupational Therapy Recommendation: 5-6 days/week, 60-120 mins/day, 1:1 treatment, Group therapy  Speech Language Pathology Recommendation: 60-120 mins/day, 1:1 treatment, Group therapy (3x/week)      The following is a list of patient problems that have been identified by the interdisciplinary team:   Encounter Problems       Encounter Problems (Active)       Bladder Management       LTG: Pt will be 100% continent and independent with bladder elim at [place] (Progressing)       Start:  01/03/22    Expected End:  01/29/22         Goal Note       Pt is on Foley.                 Cardiac Function       LTG: Pt will be independent with BP and pulse management and medication at [place] (Progressing)       Start:  01/03/22    Expected End:  01/29/22               Communication  Patient will be able to communicate wants/needs regarding POC utilizing compenstatory speech strategies at the discourse level with caregiver support as indicated. (Progressing)             Compromised Tissue integrity       Damaged tissue is healing and protected (Progressing)       Start:  01/04/22       Interventions:  1. Monitor/assess Braden scale every shift  2. Provide wound care per wound care algorithm  3. Reposition patient every 2 hours and as needed unless able to reposition self  4. Increase activity as tolerated/progressive mobility  5. Relieve pressure to bony prominences for patients at moderate and high risk  6. Avoid shearing injuries   7. Keep intact skin clean and dry  8. Use bath wipes, not soap and water, for daily bathing   9. Use incontinence wipes for cleaning urine, stool and caustic drainage; Foley care as needed   10. Monitor external devices/tubes for correct placement to prevent pressure, friction and shearing   11. Encourage use of lotion/moisturizer on skin  12. Monitor patient's hygiene  practices  13. Consult/collaborate with wound care nurse   14. Utilize specialty bed  15. Consider placing an indwelling catheter if incontinence interferes with healing of stage 3 or 4 pressure injury       Initial    Damaged tissue is healing and protected : Monitor/assess Braden scale every shift;Reposition patient every 2 hours and as needed unless able to reposition self;Increase activity as tolerated/progressive mobility;Relieve pressure to bony prominences for patients at moderate and high risk;Avoid shearing injuries;Keep intact skin clean and dry;Use bath wipes, not soap and water, for daily bathing;Use incontinence wipes for cleaning urine, stool and caustic drainage. Foley care as needed;Monitor external devices/tubes for correct placement to prevent pressure, friction and shearing;Encourage use of lotion/moisturizer on skin;Monitor patient's hygiene practices;Utilize specialty bed      Goal Note       Pt shifts positions often but needs staff help with full turns. Excoriation at bottom is slowly resolving. Continues to use specialty bed.              Nutritional status is improving (Progressing)       Start:  01/04/22       Interventions:  1. Assist patient with eating   2. Allow adequate time for meals   3. Encourage patient to take dietary supplement(s) as ordered   4. Collaborate with Clinical Nutritionist  5. Include patient/patient care companion in decisions related to nutrition       Initial    Nutritional status is improving: Assist patient with eating;Allow adequate time for meals      Goal Note       PO Intake of meals and fluids is in qs amounts.                 Hemodynamic Status: Cardiac       Stable vital signs and fluid balance (Progressing)       Start:  01/04/22    Expected End:  01/06/22       Interventions:  1. Monitor/assess vital signs and telemetry per unit protocol  2. Weigh on admission and record weight daily  3. Assess signs and symptoms associated with cardiac rhythm  changes  4. Monitor intake/output per unit protocol and/or LIP order  5. Monitor lab values  6. Monitor for leg swelling/edema and report to LIP if abnormal  Initial    Stable vital signs and fluid balance: Monitor/assess vital signs and telemetry per unit protocol;Assess signs and symptoms associated with cardiac rhythm changes;Monitor intake/output per unit protocol and/or LIP order;Monitor for leg swelling/edema and report to LIP if abnormal;Monitor lab values;Weigh on admission and record weight daily            Inadequate Tissue Perfusion       Adequate tissue perfusion will be maintained (Progressing)       Start:  01/04/22    Expected End:  01/06/22       Interventions:  1. Monitor/assess vital signs  2. Monitor/assess lab values and report abnormal values  3. Monitor/assess neurovascular status (pulses, capillary refill, pain, paresthesia, paralysis, presence of edema)  4. Monitor intake and output  5. Monitor/assess for signs of VTE (edema of calf/thigh redness, pain)  6. Monitor for signs and symptoms of a pulmonary embolism (dyspnea, tachypnea, tachycardia, confusion)  7. VTE Prevention: Administer anticoagulant(s) and/or apply anti-embolism stockings/devices as ordered  8. Encourage/assist patient as needed to turn, cough, and perform deep breathing every 2 hours  9. Reinforce use of ordered respiratory interventions (i.e. CPAP, BiPAP, Incentive Spirometer, Acapella, etc.)  10. Perform active/passive ROM  11. Reinforce ankle pump exercises  12. Increase mobility as tolerated/progressive mobility  13. Elevate feet  14. Position patient for maximum circulation/cardiac output  15. Place shoes or other foot protection on patient  16. Assess and monitor skin integrity  17. Provide wound/skin care       Initial    Adequate tissue perfusion will be maintained: Monitor/assess vital signs;Monitor/assess lab values and report abnormal values;Monitor/assess neurovascular status (pulses, capillary refill, pain,  paresthesia, paralysis, presence of edema);Monitor intake and output;Monitor for signs and symptoms of a pulmonary embolism (dyspnea, tachypnea, tachycardia, confusion);Encourage/assist patient as needed to turn, cough, and perform deep breathing every 2 hours;Reinforce use of ordered respiratory interventions (i.e. CPAP, BiPAP, Incentive Spirometer, Acapella, etc.)            Mobility       LTG: Patient will ambulate >150 feet at a Mod I level and navigate 1 FOS with CGA, both with LRAD in order to access the home and community environments.  (Progressing)             Nutrition       Nutritional intake is adequate (Progressing)       Start:  01/04/22    Expected End:  01/06/22       Interventions:  1. Monitor daily weights  2. Assist patient with meals/food selection  3. Allow adequate time for meals  4. Encourage/perform oral hygiene as appropriate   5. Encourage/administer dietary supplements as ordered (i.e. tube feed, TPN, oral, OGT/NGT, supplements)  6. Consult/collaborate with Clinical Nutritionist  7. Include patient/patient care companion in decisions related to nutrition  8. Assess anorexia, appetite, and amount of meal/food tolerated  9. Consult/collaborate with Speech Therapy (swallow evaluations)       Initial    Nutritional intake is adequate: Monitor daily weights;Assist patient with meals/food selection;Encourage/perform oral hygiene as appropriate;Encourage/administer dietary supplements as ordered (i.e. tube feed, TPN, oral, OGT/NGT, supplements);Assess anorexia, appetite, and amount of meal/food tolerated;Consult/collaborate with Clinical Nutritionist;Include patient/patient care companion in decisions related to nutrition;Consult/collaborate with Speech Therapy (swallow evaluations);Allow adequate time for meals            Pain Management       LTG: Pt will achieve good pain control with current medication regimen as evidenced  by ability to participate in 100% of therapy sessions. (Progressing)        Start:  01/03/22    Expected End:  01/29/22               Safety Risk       LTG: Pt will recognize limitations and use call bell 100% of the time prior to transfers in order to prevent fsalls (Progressing)       Start:  01/03/22    Expected End:  01/29/22               Self Care Management       LTG: Pt will perform ADL routine at spv level with use of compensatory strategies and AE prn upon d/c.  (Progressing)             Skin Wound Management       LTG: Excoriation at buttocks will resolve by [place] (Progressing)       Start:  01/03/22    Expected End:  01/29/22                   Rehab Team Discussion        Self Care Functional Status:   Admission Assessment Current Status Discharge  Goal   Functional Area: Care Score:  Care Score:    Eating 5 5 (01/04/22 0900) 6   Oral Hygiene 5 5 (01/04/22 0900) 6   Toileting Hygiene 7 2 (01/04/22 0900) 6   Shower/Bathe Self 88 88 (01/04/22 0900) 4   Upper Body Dressing 4 4 (01/06/22 0800) 6   Lower Body Dressing 2 3 (01/06/22 0800) 4   Putting On/Taking Off Footwear 7 3 (01/06/22 0800) 4       Mobility Functional Status:   Admission Assessment Current   Status  Discharge   Goal   Functional Area: Care Score:  Care Score:    Roll Left and Right 4 88 (01/10/22 1100) 6   Sit to Lying 4 4 (01/10/22 1100) 6   Lying to Sitting on Side of Bed 3 4 (01/10/22 1100) 6   Sit to Stand 3 3 (01/10/22 1100) 6   Chair/Bed-to-Chair Transfer 3 4 (01/10/22 1100) 6   Toilet Transfer 7 88 (01/04/22 0900) 6   Car Transfer 88 88 (01/10/22 1100) 6   Walk 10 Feet 88 88 (01/10/22 1100) 6   Walk 50 Feet with Two Turns 88 88 (01/10/22 1100) 6   Walk 150 Feet 88 88 (01/10/22 1100) 6   Walking 10 Feet on Uneven Surfaces 88 88 (01/10/22 1100) 6   1 Step (Curb) 88 88 (01/10/22 1100) 4   4 Steps 88 88 (01/10/22 1100) 4   12 Steps 88 88 (01/10/22 1100) 4   Wheel 50 Feet with Two Turns 9 4 (01/10/22 1100) 9   Wheel 150 Feet 9 4 (01/10/22 1100) 9   Picking Up Object 88 88 (01/04/22 0900) Independent (with use of  AE)

## 2022-01-11 NOTE — PT Progress Note (Signed)
Physical Therapy  Inpatient Rehabilitation Daily Progress Note    Patient Name:  [name] BirksCarol Rodd       Medical Record Number: [id number]   Date of Birth: 07/27/1956  Sex: Female          Room/Bed:  M501/M501-02    Therapy Received:  Start Time: 0900   Stop Time: 1000  Total Therapy Minutes: 60    Rehabilitation Precautions/Restrictions:  Weight Bearing Status: (P) no restrictions  Precaution Instructions Given to Patient: Yes  Communication Precautions: Other (comment) (encourage patient to speak loudly and slowly)  Other Precautions: (P) falls, decreased LE strength/sensation    Rehab Diagnosis: 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]  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 Clin tech assisted in dep donning patient's diaper and pants and applying cream to patient's buttocks by way of log rolling on a flat bed (patient able to log roll with supervision and use of bed rails). Upon completion of cleaning/changing, patient states that she needs to have a BM.    Patient transferred to EOB with close supervision and use of bed controls to elevate HOB. On EOB, TED hose and abdominal binder were dep donned due to patient's history of symptomatic orthostatic hypotension. Patient completes bed<>chair by way of scoot transfer with close supervision. Once wheeled to the restroom, patient's BP dropped to 96/66. Due to a sudden drop in BP despite TED hose and abdominal binder applied, and patient feeling slight dizziness, she was dep wheeled back to bed. Once in supine, patient log rolled to secure a bed pan underneath her to have a BM. Patient unsuccessful in having a BM. Needs met at the completion of the  session.         Education Materials engineerDocumentation  Wheelchair management/mobility, taught by Marquis BuggyJones, Taksh Hjort at 01/11/2022  1:24 PM.  Learner: Patient  Readiness: Acceptance  Method: Explanation  Response: Verbalizes Understanding    Safety issues and interventions, taught by Marquis BuggyJones, Mylin Gignac at 01/11/2022  1:24 PM.  Learner: Patient  Readiness: Acceptance  Method: Explanation  Response: Verbalizes Understanding    Rehab techniques/procedure, taught by Marquis BuggyJones, Daymeon Fischman at 01/11/2022  1:24 PM.  Learner: Patient  Readiness: Acceptance  Method: Explanation  Response: Verbalizes Understanding    Precautions, taught by Marquis BuggyJones, Alain Deschene at 01/11/2022  1:24 PM.  Learner: Patient  Readiness: Acceptance  Method: Explanation  Response: Verbalizes Understanding    Plan of care, taught by Marquis BuggyJones, Shekelia Boutin at 01/11/2022  1:24 PM.  Learner: Patient  Readiness: Acceptance  Method: Explanation  Response: Verbalizes Understanding    Home exercise program, taught by Marquis BuggyJones, Segundo Makela at 01/11/2022  1:24 PM.  Learner: Patient  Readiness: Acceptance  Method: Explanation  Response: Verbalizes Understanding    Functional transfers/mobility, taught by Marquis BuggyJones, Matias Thurman at 01/11/2022  1:24 PM.  Learner: Patient  Readiness: Acceptance  Method: Explanation  Response: Verbalizes Understanding    Fall prevention/balance training, taught by Marquis BuggyJones, Raijon Lindfors at 01/11/2022  1:24 PM.  Learner: Patient  Readiness: Acceptance  Method: Explanation  Response: Verbalizes Understanding    Equipment, taught by Marquis BuggyJones, Finley Dinkel at 01/11/2022  1:24 PM.  Learner: Patient  Readiness: Acceptance  Method: Explanation  Response: Verbalizes Understanding    Cognitive function, taught by Marquis Buggy at 01/11/2022  1:24 PM.  Learner: Patient  Readiness: Acceptance  Method: Explanation  Response: Verbalizes Understanding    Bowel and bladder management, taught by Marquis Buggy at 01/11/2022  1:24 PM.  Learner: Patient  Readiness: Acceptance  Method: Explanation  Response: Verbalizes  Understanding    ADL retraining, taught by Marquis Buggy at 01/11/2022  1:24 PM.  Learner: Patient  Readiness: Acceptance  Method: Explanation  Response: Verbalizes Understanding    Education Comments  No comments found.         Assessment & Plan   Patient limited by orthostatic hypotension today in the seated position despite having TED hose and abdominal binder donned. Patient unsuccessful in having BM in the bed pan despite stated need to have a BM. Plan to trial weightbearing activity as BP allows. Patient will benefit from skilled PT to address these deficits and decrease BOC upon [place].    Plan:  Risks/Benefits/POC Discussed with Pt/Family: With patient  Treatment/Interventions: Gait training, Exercise, Stair training, Neuromuscular re-education, Functional transfer training, LE strengthening/ROM, Endurance training, Patient/family training, Equipment eval/education, Bed mobility, Compensatory technique education, Continued evaluation    Recommendation:  Discharge Recommendation: Home with home health PT  PT Therapy Recommendation: 5-6 days/week, 60-120 mins/day, 1:1 treatment  PT Estimated Length of Stay: 10-14 days from IE on 01/04/2022

## 2022-01-11 NOTE — Progress Notes (Signed)
Writer received a call from [employer] stating they did not receive yesterdays clinical.    Re faxed to patient's Medicare Anthem @ [phone]    Grace Blight MSW  Acute Rehab Case Manager  [facility]   P: [phone]  [email or web address]

## 2022-01-11 NOTE — OT Progress Note (Signed)
Occupational Therapy  Inpatient Rehabilitation Daily Progress Note    Patient Name:  [name]       Medical Record Number: [id number]   Date of Birth: September 23, 1956  Sex: Female          Room/Bed:  M501/M501-02    Therapy Received:  Start Time: 1400   Stop Time: 1500  Total Therapy Minutes: 60    Rehabilitation Precautions/Restrictions:  Weight Bearing Status: no restrictions  Precaution Instructions Given to Patient: Yes  Communication Precautions: Other (comment) (encourage patient to speak loudly and slowly)  Other Precautions: falls, decreased LE strength/sensation    Rehab Diagnosis: 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]  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  Pt at I level for supine to sit EOB.  Scooting along EOB at SPV level.  Mod A to don abdominal binder sitting EOB.  Transferring bed to w/c squat pivot at close SPV-touch A.  Once sitting in w/c, BP dropped to 89/64.  Propped B LE's on chair.  Pt not symptomatic.    Engaged in B UE strengthening using medium resistance theraband to complete B shld flexion/extension, shld abduction/adduction, and elbow flexion/extension to INC strength for all functional tasks.  Engaged in leisure interest conversations.  Pt sitting up in w/c with B LE's elevated for 30 minutes.  Pt requesting to return to bed to semi supine position at end of session.        Education Documentation  Plan of care, taught by Darrold Junker, OT at 01/11/2022  3:44 PM.  Learner: Patient  Readiness: Acceptance  Method: Explanation  Response: Needs Reinforcement, Verbalizes Understanding    Education Comments  No comments found.        Assessment & Plan   Pt tolerated being out of bed today for increased time  with pt sitting in the w/c with B LE's elevated.  Pt will benefit from continued focus on increasing her tolerance out of bed during functional tasks.  Pt did present with improved mood today and increased motivation to attempt out of bed activities.      Plan:  Risks/Benefits/POC Discussed with Pt/Family: With patient  Treatment Interventions: ADL retraining, Functional transfer training, UE strengthening/ROM, Endurance training, Cognitive reorientation, Patient/Family training, Equipment eval/education, Neuro muscular reeducation, Fine motor coordination activities, Compensatory technique education    Recommendation:  Discharge Recommendation: Home with home health OT, Home with home health PT  OT Therapy Recommendation: 5-6 days/week, 60-120 mins/day, 1:1 treatment, Group therapy  OT Estimated Length of Stay: 14 days

## 2022-01-11 NOTE — Progress Notes (Signed)
PHYSICAL MEDICINE AND REHABILITATION  PROGRESS NOTE  FACE TO Bruce DonathFACE ENCOUNTER    Date Time: 01/11/22 5:47 PM    Patient Name: [name],[name]  Admission date:  01/03/2022  (LOS: 8 days)    Subjective:     Patient complained of SOB today, CXR normal  Will increase midodrine to 5mg  from 2.5, due to orthostatism with therapy     Functional Status:     Pt tolerated being out of bed today for increased time with pt sitting in the w/c with B LE's elevated.  Pt will benefit from continued focus on increasing her tolerance out of bed during functional tasks.  Pt did present with improved mood today and increased motivation to attempt out of bed activities.      Medications:   Medication reviewed by me:     Scheduled Meds: PRN Meds:    apixaban, 5 mg, Oral, Q12H SCH  budesonide-formoterol, 2 puff, Inhalation, BID  ferrous sulfate, 324 mg, Oral, QAM W/BREAKFAST  lidocaine, 1 patch, Transdermal, Q24H  lurasidone, 120 mg, Oral, QPM  metoprolol succinate, 125 mg, Oral, QHS  [START ON 01/12/2022] midodrine, 5 mg, Oral, BID Meals  nystatin, 500,000 Units, Oral, QID  pantoprazole, 40 mg, Oral, QAM AC  potassium chloride, 40 mEq, Oral, Daily  QUEtiapine, 100 mg, Oral, QHS  rOPINIRole, 1 mg, Oral, QHS  senna-docusate, 2 tablet, Oral, QHS  thiamine, 100 mg, Oral, Daily  vitamin B-6, 50 mg, Oral, Daily  vitamin D, 25 mcg, Oral, Daily  vitamins/minerals, 1 tablet, Oral, Daily at 1700  zinc sulfate, 220 mg, Oral, Daily at 1700        Continuous Infusions:   acetaminophen, 650 mg, Q4H PRN  albuterol-ipratropium, 3 mL, Q6H PRN  bisacodyl, 10 mg, Daily PRN  bisacodyl, 10 mg, Daily PRN  diphenhydrAMINE, 25 mg, Q6H PRN  gabapentin, 100 mg, TID PRN  HYDROmorphone, 2 mg, Q6H PRN  lactulose, 20 g, Q4H PRN  LORazepam, 0.5 mg, QHS PRN  melatonin, 3 mg, QHS PRN  promethazine, 25 mg, Q6H PRN          Medication Review  A complete drug regimen review was completed: Yes  Were drug issues were found during review: No   If yes to drug issues found during  review:  What was the issue?:   What was the time the issue was identified?:   Was I contacted and action was taken by midnight of the next calendar day once issue was identified?: N/A   Person who contacted me:   Action taken:     Review of Systems:   A comprehensive review of systems was: No fevers, chills, nausea, vomiting, chest pain, shortness of breath, cough, headache, double vision.  All others negative.    Labs:     Recent Labs   Lab 01/10/22  1519 01/06/22  0516 01/05/22  0654   WBC 9.30 9.34 11.07*   Hgb 9.9* 9.1* 9.3*   Hematocrit 30.8* 28.4* 29.7*   Platelets 369* 490* 330      Recent Labs   Lab 01/10/22  1519 01/06/22  0516 01/05/22  0654   Sodium 129* 136 130*   Potassium 4.9 4.2 4.3   Chloride 104 106 104   CO2 14* 18 13*   BUN 8.0 10.0 10.0   Creatinine 0.7 0.6 0.6   Calcium 9.5 9.4 9.2   Albumin  --   --  3.2*   Protein, Total  --   --  6.6   Bilirubin,  Total  --   --  0.4   Alkaline Phosphatase  --   --  65   ALT  --   --  34   AST (SGOT)  --   --  23   Glucose 88 88 107*   Magnesium  --   --  2.0   Phosphorus  --   --  4.2         Rads:   Radiological Procedure reviewed.  Radiology Results (24 Hour)       Procedure Component Value Units Date/Time    XR Chest AP Portable [[id number]] Collected: 01/11/22 1211    Order Status: Completed Updated: 01/11/22 1214    Narrative:      INDICATION: Shortness of breath    COMPARISON: 12/24/2021    FINDINGS:  A single radiograph of the chest performed. Portable film.  The heart is normal in size.   The mediastinal and hilar structures are within normal limits.  The lung fields are clear. No pleural effusion.  No pneumothorax.    The  visualized osseous structures demonstrates no acute abnormality.      Impression:       No active disease    Kinnie Feil, MD  01/11/2022 12:12 PM          Physical Exam:     Vitals:    01/11/22 1121 01/11/22 1400 01/11/22 1419 01/11/22 1723   BP: 123/79 100/66 100/66 97/65   Pulse: (!) 111 (!) 57 (!) 57 (!) 109   Resp: 18   18    Temp: 98 F (36.7 C)   97.5 F (36.4 C)   TempSrc: Oral   Oral   SpO2: 100%  96% 100%   Weight:       Height:           Intake and Output Summary (Last 24 hours) at Date Time    Intake/Output Summary (Last 24 hours) at 01/11/2022 1747  Last data filed at 01/11/2022 1121  Gross per 24 hour   Intake 129 ml   Output 1800 ml   Net -1671 ml     P.O.: 129 mL (01/11/22 0930)     Urine: 0 mL (01/11/22 0930)             Gen: Alert, NAD  Psych: Pleasant and cooperative  Skin: Pink, warm, no rashes  HEENT: Atraumatic, normocephalic,   Chest: symmetric bilateral chest rise and fall,   CV: Regular rate and rhythm, no murmurs  Resp: breath sounds equal throughout; non labored  Abd: non-distended, soft nontender  Extr: Pink, warm,  Neuro:  Fluent speech     Assessment and Plan:     Hether 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   Occupational Therapy focused on improving strength, endurance, transfers, activities of daily living, and cognition for 60-120 mins/day, 5-6 days/week, for 14 days.     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%   Metoprolol,   losartan held due to hypotension     #  COPD  Symbicort      # bipolar disorder  Latuda   Seroquel     # Pain Management   Daily monitoring   gabapentin  Acetaminophen, dilaudid PRN     #Bowel Management  constipation - Senna, Colace, Bisacodyl suppository PRN; hold for loose stools    #Bladder Management  Check PVRs    #Skin Management  Frequent skin assessment  Moisture barrier PRN    #FEN/GI with severe  malnutrition   cardiac diet    #DVT PPx  eliquis    #Dispo  Pending rehab progress      Continue comprehensive and intensive inpatient rehab program, including:   Physical therapy 60-120 min daily, 5-6 times per week, Occupational therapy 60-120 min daily, 5-6 times per week, Case management and Rehabilitation nursing.  Psychology services to evaluate and treat as needed     Signed by: Cheryll Dessert, MD   01/11/2022, 5:47 PM    [facility]

## 2022-01-12 LAB — CBC
Absolute NRBC: 0 10*3/uL (ref 0.00–0.00)
Hematocrit: 31.6 % — ABNORMAL LOW (ref 34.7–43.7)
Hgb: 10 g/dL — ABNORMAL LOW (ref 11.4–14.8)
MCH: 25.6 pg (ref 25.1–33.5)
MCHC: 31.6 g/dL (ref 31.5–35.8)
MCV: 80.8 fL (ref 78.0–96.0)
MPV: 10.3 fL (ref 8.9–12.5)
Nucleated RBC: 0 /100 WBC (ref 0.0–0.0)
Platelets: 333 10*3/uL (ref 142–346)
RBC: 3.91 10*6/uL (ref 3.90–5.10)
RDW: 19 % — ABNORMAL HIGH (ref 11–15)
WBC: 10.14 10*3/uL — ABNORMAL HIGH (ref 3.10–9.50)

## 2022-01-12 LAB — BASIC METABOLIC PANEL
Anion Gap: 12 (ref 5.0–15.0)
BUN: 8 mg/dL (ref 7.0–21.0)
CO2: 12 mEq/L — ABNORMAL LOW (ref 17–29)
Calcium: 9.5 mg/dL (ref 8.5–10.5)
Chloride: 106 mEq/L (ref 99–111)
Creatinine: 0.8 mg/dL (ref 0.4–1.0)
Glucose: 117 mg/dL — ABNORMAL HIGH (ref 70–100)
Potassium: 5.3 mEq/L (ref 3.5–5.3)
Sodium: 130 mEq/L — ABNORMAL LOW (ref 135–145)

## 2022-01-12 LAB — GFR: EGFR: >60

## 2022-01-12 NOTE — Progress Notes (Signed)
MEDICINE PROGRESS NOTE  [facility], DIVISION OF HOSPITALIST MEDICINE   [facility]   [place] Pager: [phone]      Date Time: 01/12/22 12:52 PM  Patient Name: [name]  Attending Physician: Anastasia Pall, [facility] Day: 10  Assessment:     Active Hospital Problems    Diagnosis    Myositis     66 year old female with past medical history significant for diabetes, hypertension, nonischemic cardiomyopathy, paroxysmal A-fib, anemia, bipolar, TIAs, patient presented to 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  Patient was subsequent discharged on 2/17 will need to go back to the hospital on 2/18 for continued management of rhabdomyolysis.  Patient noted to have left-sided weakness with associated difficulty with walking, standing, also developing bilateral hand and feet paresthesias and hand weakness she subsequent presented [facility] and was seen by neurology, work-up including MRIs, lumbar puncture done with no clear etiology subsequent with transfer to [facility] for consideration for IVIG.  While at [facility] first dose of IVIG was started outpatient setting developed allergic reaction therefore discontinued.  Diagnosis at time of discharge unclear include possible statin induced myositis versus inflammatory myositis versus immune mediated myositis.  She was noted to have multiple vitamin deficiencies including D, zinc, B6, iron deficiency which was treated.  Patient was discharged to acute rehab on 3/14.    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 and less likely to suggest autoimmune process   -Multiple vitamin deficiencies including vitamin D, zinc, B6, iron which were replaced  -PT/OT  -Avoid statins  -May need  muscle biopsy?; extensive work-up nonrevealing  -Negative paraneoplastic autoantibody from 3/7    Hyponatremia  Na 129  Will recheck today and check Urine studies if low     Vitamin D, B6, B1, zinc deficiencies  -Continue multivitamin supplement  -Continue thiamine(received IV replacement 3/4-3/9), vitamin B6, zinc supplements     Normocytic anemia  -Noted TSAT of 21%, ferritin 492, normal folate level on 3/10; normal vitamin B12 level 3/4  -cont ferrous sulfate  -Hb has been stable     Moderate protein calorie malnutrition  -Weight loss, was on Ozempic but discontinued  -Nutrition consult  -Currently on Remeron    Paroxysmal A-fib--currently in sinus  -cont metoprolol XL 125 mg in evening (increased from 100 mg on 3/17)  -Continue Eliquis  -Follow heart rate    Orthostatic hypotension  -Responding to midodrine 2.5 mg in am and afternoon for now (continuing metoprolol with tachycardia and PAF)  -Noted today's blood pressure seems to be more stable with systolic blood pressure 100-120s     Urinary retension  -Currently has a Foley catheter, failed voiding trial on 3/13   -Consider attempting another voiding trial this week  -May consider adding Flomax but will hold off to avoid any significant hypotension including with recent increase in metoprolol XL     COPD--Not in exacerbation   -continue Symbicort  -prn duonebs  Complains of shortness of breath on 3/21-resolved  -/no hypoxia noted/no fever  -Chest x-ray was done 3/21-no acute changes  -Patient subsequently felt better  -has  been using her inhaler, no wheezing noted      History of nonischemic cardiopathy  -Echo 12/31/2021 with EF 43%, mild global hypokinesis  -Noted per cardiology note 3/13 with no further plans for ischemic work-up as patient  had cardiac catheterization in 2022 with no obstructive CAD--outpatient follow-up for nonischemic cardiomyopathy work-up  -Repeat echo as outpatient     Bipolar disorder  -Continue home meds including Seroquel, ropinirole; home latuda 120mg  daily(pt has home med)     Diabetes--Hemoglobin A1c 5.3 on 3/6  -Patient had been on Ozempic but stopped due to malnutrition/weight loss  -No need for sliding-scale for now    Discussed with patient, RN    Subjective     CC: Myositis  Patient seen with OT. Pt did well. No dizziness. BP was stable -no orthostasis during therapy.  .Patient denies any lightheadedness or dizziness.  Patient denies any chest pain or shortness of breath.  No cough    Review of Systems:   Review of Systems - Negative except as above in HPI    Physical Exam:     Temp:  [97.5 F (36.4 C)-98.1 F (36.7 C)] 98.1 F (36.7 C)  Heart Rate:  [57-110] 106  Resp Rate:  [18] 18  BP: (97-125)/(53-81) 107/72    Intake/Output Summary (Last 24 hours) at 01/12/2022 1252  Last data filed at 01/12/2022 0900  Gross per 24 hour   Intake 200 ml   Output 300 ml   Net -100 ml      General: alert and awake, no distress  HEENT: anicteric, moist mucous membranes  Neck: supple, no JVD  CVS: nl S1, S2; no gallop or rub  Lungs: clear to auscultation b/l, no wheezing or rhonchi; normal resp effort  Abd: soft, non-tender, non-distended  Ext: no pedal edema, no cyanosis  Neuro: alert and awake, moving all extremities       Meds:   Medications were reviewed:  Scheduled Meds:  Current Facility-Administered Medications   Medication Dose Route Frequency    apixaban  5 mg Oral Q12H SCH    budesonide-formoterol  2 puff Inhalation BID    ferrous sulfate  324 mg Oral QAM W/BREAKFAST    lidocaine  1 patch Transdermal Q24H    lurasidone  120 mg Oral QPM    metoprolol succinate  125 mg Oral QHS    midodrine  5 mg Oral BID Meals    nystatin  500,000 Units Oral QID    pantoprazole  40 mg Oral QAM AC    potassium chloride  40 mEq Oral Daily     QUEtiapine  100 mg Oral QHS    rOPINIRole  1 mg Oral QHS    senna-docusate  2 tablet Oral QHS    thiamine  100 mg Oral Daily    vitamin B-6  50 mg Oral Daily    vitamin D  25 mcg Oral Daily    vitamins/minerals  1 tablet Oral Daily at 1700    zinc sulfate  220 mg Oral Daily at 1700     Continuous Infusions:  PRN Meds:.acetaminophen, albuterol-ipratropium, bisacodyl, bisacodyl, diphenhydrAMINE, gabapentin, HYDROmorphone, lactulose, LORazepam, melatonin, promethazine  Labs/Radiology:   Imaging personally reviewed, including: all available   No results found.  No results for input(s): GLUCOSEWB in the last 24 hours.  Recent Labs   Lab 01/10/22  1519 01/06/22  0516   Sodium 129* 136   Potassium 4.9 4.2   Chloride 104 106   BUN 8.0 10.0  Creatinine 0.7 0.6   EGFR >60.0 >60.0   Glucose 88 88   Calcium 9.5 9.4       Recent Labs   Lab 01/10/22  1519 01/06/22  0516   WBC 9.30 9.34   Hgb 9.9* 9.1*   Hematocrit 30.8* 28.4*   Platelets 369* 490*                   This note was generated by the Epic EMR system/ Dragon speech recognition and may contain inherent errors or omissions not intended by the user. Grammatical errors, random word insertions, deletions and pronoun errors  are occasional consequences of this technology due to software limitations. Not all errors are caught or corrected. If there are questions or concerns about the content of this note or information contained within the body of this dictation they should be addressed directly with the author for clarification.    Signed by: Laretta BolsterKanchan Tyrees Chopin, MD

## 2022-01-12 NOTE — Plan of Care (Addendum)
Problem: Cardiac Function  Goal: LTG: Pt will be independent with BP and pulse management and medication at [place]  Outcome: Progressing   Pt's heart rate continues to be tachy at 104. Medication given to assist with getting rate under control. BP 119/75 and TED hose removed for the night.        Pt did have two large bowel movements this shift. Educated to increase fiber intake to prevent   constipation.    Pt had 100 cc's of dark tea colored urine out. Pt educated she needs to drink more fluids to prevent dehydration and kidney injury.

## 2022-01-12 NOTE — Progress Notes (Addendum)
Start [facility] Note  Home Health Referral    Referral from [name] (Case Manager) for home health care upon discharge.    By [employer], the patient has the right to freely choose a home care provider.    A company of the patients choosing. We have supplied the patient with a listing of providers in your area who asked to be included and participate in Medicare.   [facility], formerly [facility], a home care agency that provides adult home care services and participates in Medicare   The preferred provider of your insurance company. Choosing a home care provider other than your insurance company's preferred provider may affect your insurance coverage.      Home Health Discharge Information    Your doctor has ordered Skilled Nursing, Physical Therapy, and Occupational Therapy in-home service(s) for you while you recuperate at home, to assist you in the transition from hospital to home.    The agency that you or your representative chose to provide the service:   [facility] [phone]          The above services were set up by:  Audie Pinto, RN [facility] Liaison) Phone  [phone]      IF YOU HAVE NOT HEARD FROM YOUR HOME YOUR HOME HEALTH AGENCY WITHIN 24-48 HOURS AFTER DISCHARGE PLEASE CALL YOUR AGENCY TO ARRANGE A TIME FOR YOUR FIRST VISIT. FOR ANY SCHEDULING CONCERNS OR QUESTIONS RELATED TO HOME HEALTH, SUCH AS TIME OR DATE PLEASE CONTACT YOUR HOME HEALTH AGENCY AT THE NUMBER LISTED ABOVE.    Additional comments: Spoke with patient at bedside to discuss [facility] services. Patient agreeable to services with no preference of HH agency.           START PATIENT REGISTRATION INFORMATION     Order Information  Order Signing Physician: Eppie Gibson     Service Ordered RN ?: Yes  Service Ordered PT ?: Yes  Service Ordered OT ?: Yes  Service Ordered ST ?: No    Service Ordered MSW?: No    Service Ordered HHA?: No    Following Physician: [name], MD  Following Physician Phone:  [phone]  Overseeing Physician: N/A  (Required for Residents only)   Agreeable to Follow?: Yes  Spoke with: Receptionist  Date/Time of Call: 01/12/22 3:16 PM      Care Coordination     Primary Care Physician:[name], MD  Primary Care Physician Phone:[phone]  Primary Care Physician Address: [address] / [place] [address]  PCP NPI: [id number]  Visit Instructions: N/A  Service Discharge Location Type: Home    Demographics  Patient Last Name: [name]   Patient First Name: [name]  Language/Communication Barrier: none   Service Address: [address]  [place] [address]   Service Home Phone: [phone] (home)   Other phone numbers:    Telephone Information:   Mobile [phone]     Emergency Contact: Extended Emergency Contact Information  Primary Emergency Contact: [name]  Address: [address]           [place], [place] [address] [place]  Home Phone: [phone]  Mobile Phone: [phone]  Relation: Daughter  Primary Phone Number:  [phone](daughter)  Secondary Phone Number:  [phone](c)    Admission Information  Admit Date: 01/03/2022  Patient Status at discharge: Inpatient  Admitting Diagnosis: 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]  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]     Caregiver Information  Caregiver First Name: Morrell Riddle  Caregiver  Last Name: Remonia Richter  Caregiver Relationship to Patient: Child  Caregiver Phone Number: [phone]  Caregiver Notes: N/A            Data processing manager Information  Primary Subscriber: Medicare [employer] MED[employer]  Primary Subscriber Relation To Guarantor: Self  Primary Payor:   Primary Plan:   Primary Group #:  [id number]  Primary Subscriber ID:  [id number]  Primary Subscriber DOB: 1956-10-16  Secondary Insurance Information  Secondary Subscriber: Medicaid-Medicaid [place]  Secondary Subscriber Relation To Guarantor: self  Secondary Payor:   Secondary Plan:   Secondary Group #:   Secondary Subscriber ID:  [id number]  Secondary Subscriber DOB: 1956-04-14  HITECH  Foley care and education      END PATIENT REGISTRATION INFORMATION       Diagnosis: Myositis   CVA (cerebral vascular accident)   Chest pain with high risk of acute coronary syndrome   Paroxysmal atrial fibrillation   Hypertension   Hyperlipidemia   Seizure disorder   History of gastrointestinal hemorrhage   Depression   Anemia   Asthma   Non-traumatic subcutaneous emphysema   Chest pain with moderate risk for cardiac etiology   Left-sided weakness   History of asthma   History of transient ischemic attack (TIA)   Type 2 diabetes mellitus   Syncope and collapse   Weakness of both legs   Hypokalemia   Thoracic back pain   Gastroparesis   Guillain Barr syndrome    Start [facility] Summary      COVID-19 Screening:    COVID-19   Negative                                                                             If positive or result pending, is patient agreeable to wearing PPE during visit? N/A    Additional Comments: None    End [facility] Summary     Discharge Date:  01/18/22    Referral Source  Signed by: Audie Pinto, RN  Date Time: 01/12/22 3:16 PM      End [facility] Note        Home Health face-to-face (FTF) Encounter (Order [id number])  Consult  Date: 01/12/2022 Department: [facility] Ordering/Authorizing: Anastasia Pall, Edson Snowball, MD     Order Information    Order Date/Time Release Date/Time Start Date/Time End Date/Time   01/12/22 03:10 PM None 01/12/22 03:06 PM 01/12/22 03:06 PM     Order Details    Frequency Duration Priority Order Class   Once 1  occurrence Routine Hospital Performed     Standing Order Information    Remaining Occurrences Interval Last Released     0/1 Once 01/12/2022              Provider Information    Ordering User Ordering Provider Authorizing Provider   [place], Kimber Relic, RN Anastasia Pall, Edson Snowball, MD Anastasia Pall, Edson Snowball, MD   Attending Provider(s) Admitting Provider PCP   Anastasia Pall, Edson Snowball, MD Anastasia Pall, Edson Snowball, MD Malcolm Metro, MD     Verbal Order Info    Action Created on Order Mode Entered by Responsible Provider Signed by Signed  on   Ordering 01/12/22 1510 Telephone with Valerie Roys, RN Anastasia Pall, Edson Snowball, MD Cheryll Dessert, MD 01/12/22 1516           Comments    Home nursing required for skilled assessment including cardiopulmonary assessment and dietary education for disease management, and medication instruction. Home PT/OT required for gait and balance training, strengthening, mobility, fall prevention, and ADL training.     Physician to follow Raenell C. Mayford Knife, MD      UE:[id number]   CVA (cerebral vascular accident)   Chest pain with high risk of acute coronary syndrome   Paroxysmal atrial fibrillation   Hypertension   Hyperlipidemia   Seizure disorder   History of gastrointestinal hemorrhage   Depression   Anemia   Asthma   Non-traumatic subcutaneous emphysema   Chest pain with moderate risk for cardiac etiology   Left-sided weakness   History of asthma   History of transient ischemic attack (TIA)   Type 2 diabetes mellitus   Syncope and collapse   Weakness of both legs   Hypokalemia   Thoracic back pain   Gastroparesis   Guillain Barr syndrome                Home Health face-to-face (FTF) Encounter: Patient Communication     Not Released  Not seen         Order Questions    Question Answer   Date I saw the patient face-to-face: 01/12/2022   Evidence this patient is homebound because: C.  Decreased endurance, strength, ROM, cadence, safety/judgment during mobility    E.  Requires assistance of another person or assistive device to ambulate > 5 feet    F.  Deconditioned due to advance disease process requiring assistance to leave home    G.  Fall risk due to impaired coordination, gait and decreased balance    L.  Transfer/ambulation requires stand by assist and verbal cues to perform safely    M.  Unsteady gait, poor ambulation with history of falls    N.   Impaired mobility d/t pain, arthritis, weakness that compromises patient safety   Medical conditions that necessitate Home Health care: E.  Exacerbation of disease requiring follow up monitoring    F.  New diagnosis & treatment requiring follow up monitoring and management    G.  High risk/complex medications requiring instruction and management    H.  Multiple new medications requiring management and monitoring   Per clinical findings, following services are medically necessary: Skilled Nursing    PT    OT   Clinical findings that support the need for Skilled Nursing. SN will: C. Monitor for signs and symptoms of exacerbation of disease and management    D. Review medication reconciliation, manage and educate on use and side effects    F. Instruct on Lovenox or Coumadin use/monitoring/complications    G. Educate on new diagnosis, treatment & management to prevent re-hospitalization    H. Assess cardiopulmonary status and monitor for signs &symptoms of exacerbation    I.  Educate dietary and or fluid restrictions and weight management   Clinical findings that support the need for Physical Therapy. PT will A.  Evaluate and treat functional impairment and improve mobility    C.  Educate on weight bearing status, stair/gait training, balance & coordination    D.  Provide services to help restore function, mobility, and releive pain    E.  Educate on functional mobility; bed, chair,  sit, stand and transfer activities    F.  Perform home safety assessment & develop safe in home exercise program    G.  Implement activities to improve stance time, cadence & step length    H.  Educate on the safe use of assistive device/ durable medical equipment    I.  Instruct on restorative activities to restore ability to perform ADL    J.  Educate on wheelchair mobility, ramp negotiation, and therapeutic exercises   OT will provide assistance with: Home program to improve ability to perform ADLs    Restorative program to improve  mobility and independence    Strategies to compensate for loss of function    Recovery and maintenance skills    Basic motor function and reasoning abilities   Other (please specify) See comments                    Process Instructions    Please select Home Care Services medically necessary.     Based on the above findings, I certify that this patient is confined to the home and needs intermittent skilled nursing care, physical therapry and / or speech therapy or continues to need occupational therapy. The patient is under my care, and I have initiated the establishment of the plan of care. This patient will be followed by a physician who will periodically review the plan of care.      Collection Information            Consult Order Info    ID Description Priority Start Date Start Time   [id number] Home Health face-to-face (FTF) Encounter Routine 01/12/2022  3:06 PM   Provider Specialty Referred to   ______________________________________ _____________________________________         Acknowledgement Info    For At Acknowledged By Acknowledged On   Placing Order 01/12/22 1510 Rubye Beach, RN 01/12/22 1510                     Verbal Order Info    Action Created on Order Mode Entered by Responsible Provider Signed by Signed on   Ordering 01/12/22 1510 Telephone with Valerie Roys, RN Anastasia Pall, Edson Snowball, MD Cheryll Dessert, MD 01/12/22 1516           Patient Information    Patient Name   [name] Legal Sex   Female DOB   01-18-1956       Reprint Order Requisition    Home Health face-to-face (FTF) Encounter (Order #[id number]) on 01/12/22       Additional Information    Associated Reports External References   Priority and Order Details [employer]

## 2022-01-12 NOTE — Progress Notes (Signed)
PHYSICAL MEDICINE AND REHABILITATION  PROGRESS NOTE  FACE TO Bruce Donath    Date Time: 01/12/22 3:42 PM    Patient Name: [name],[name]  Admission date:  01/03/2022  (LOS: 9 days)    Subjective:     Patient seen in wheelchair at lunch  Tolerated PT well today, BP better as well     Functional Status:     Pt tolerated OOB activity up in w/c. She is motivated to stay up in the w/c, but displays self-limiting behavior when it comes to attempting steps. She tolerated standing with CGA for 10-40 sec, but adamantly declined taking steps. Pt's BP stable as above. Recommend continued therapy to maximize her functional mobility and independence.     Medications:   Medication reviewed by me:     Scheduled Meds: PRN Meds:    apixaban, 5 mg, Oral, Q12H SCH  budesonide-formoterol, 2 puff, Inhalation, BID  ferrous sulfate, 324 mg, Oral, QAM W/BREAKFAST  lidocaine, 1 patch, Transdermal, Q24H  lurasidone, 120 mg, Oral, QPM  metoprolol succinate, 125 mg, Oral, QHS  midodrine, 5 mg, Oral, BID Meals  nystatin, 500,000 Units, Oral, QID  pantoprazole, 40 mg, Oral, QAM AC  potassium chloride, 40 mEq, Oral, Daily  QUEtiapine, 100 mg, Oral, QHS  rOPINIRole, 1 mg, Oral, QHS  senna-docusate, 2 tablet, Oral, QHS  thiamine, 100 mg, Oral, Daily  vitamin B-6, 50 mg, Oral, Daily  vitamin D, 25 mcg, Oral, Daily  vitamins/minerals, 1 tablet, Oral, Daily at 1700  zinc sulfate, 220 mg, Oral, Daily at 1700        Continuous Infusions:   acetaminophen, 650 mg, Q4H PRN  albuterol-ipratropium, 3 mL, Q6H PRN  bisacodyl, 10 mg, Daily PRN  bisacodyl, 10 mg, Daily PRN  diphenhydrAMINE, 25 mg, Q6H PRN  gabapentin, 100 mg, TID PRN  HYDROmorphone, 2 mg, Q6H PRN  lactulose, 20 g, Q4H PRN  LORazepam, 0.5 mg, QHS PRN  melatonin, 3 mg, QHS PRN  promethazine, 25 mg, Q6H PRN          Medication Review  A complete drug regimen review was completed: Yes  Were drug issues were found during review: No   If yes to drug issues found during review:  What was the issue?:    What was the time the issue was identified?:   Was I contacted and action was taken by midnight of the next calendar day once issue was identified?: N/A   Person who contacted me:   Action taken:     Review of Systems:   A comprehensive review of systems was: No fevers, chills, nausea, vomiting, chest pain, shortness of breath, cough, headache, double vision.  All others negative.    Labs:     Recent Labs   Lab 01/10/22  1519 01/06/22  0516   WBC 9.30 9.34   Hgb 9.9* 9.1*   Hematocrit 30.8* 28.4*   Platelets 369* 490*      Recent Labs   Lab 01/10/22  1519 01/06/22  0516   Sodium 129* 136   Potassium 4.9 4.2   Chloride 104 106   CO2 14* 18   BUN 8.0 10.0   Creatinine 0.7 0.6   Calcium 9.5 9.4   Glucose 88 88         Rads:   Radiological Procedure reviewed.  Radiology Results (24 Hour)       ** No results found for the last 24 hours. **          Physical  Exam:     Vitals:    01/12/22 1106 01/12/22 1118 01/12/22 1130 01/12/22 1140   BP: 115/79 115/69 107/71 107/72   Pulse: (!) 103 (!) 105 (!) 108 (!) 106   Resp:       Temp:       TempSrc:       SpO2:       Weight:       Height:           Intake and Output Summary (Last 24 hours) at Date Time    Intake/Output Summary (Last 24 hours) at 01/12/2022 1542  Last data filed at 01/12/2022 1055  Gross per 24 hour   Intake 200 ml   Output 600 ml   Net -400 ml     P.O.: 200 mL (01/12/22 0900)     Urine: 0 mL (01/12/22 0900)             Gen: Alert, NAD  Psych: Pleasant and cooperative  Skin: Pink, warm, no rashes  HEENT: Atraumatic, normocephalic,   Chest: symmetric bilateral chest rise and fall,   CV: Regular rate and rhythm, no murmurs  Resp: breath sounds equal throughout; ctax2  Abd: non-distended, +BS  Extr: Pink, warm,  Neuro:  Moving all ext    Assessment and Plan:     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   Occupational Therapy focused on improving strength, endurance, transfers, activities of daily living, and cognition for 60-120 mins/day, 5-6 days/week, for 14 days.     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%   Metoprolol,   losartan held due to hypotension     # COPD  Symbicort      # bipolar disorder  Latuda   Seroquel     # Pain Management   Daily monitoring   gabapentin  Acetaminophen, dilaudid PRN     #Bowel Management  constipation - Senna, Colace, Bisacodyl suppository PRN; hold for loose stools    #Bladder Management  Check PVRs    #Skin Management  Frequent skin assessment  Moisture barrier PRN    #FEN/GI with severe malnutrition   cardiac diet    #DVT PPx  eliquis    #Dispo  Pending rehab progress      Continue comprehensive and intensive inpatient rehab program, including:   Physical therapy 60-120 min daily, 5-6 times per week, Occupational therapy 60-120 min daily, 5-6 times per week, Case management and Rehabilitation nursing.  Psychology services to evaluate and treat as needed     Signed by: Cheryll Dessertarlos R Perez Gonzalez, MD   01/12/2022, 3:42 PM    [facility]

## 2022-01-12 NOTE — SLP Progress Note (Addendum)
Speech Language Pathology  Inpatient Rehabilitation Weekly Progress Note    Patient Name:  [name]       Medical Record Number: [id number]   Date of Birth: August 21, 1956  Sex: Female          Room/Bed:  M501/M501-02    Therapy Received:  Start Time: 1300    Stop Time: 1400  Total Therapy Minutes: 60    Rehabilitation Precautions/Restrictions:  Weight Bearing Status: no restrictions  Precaution Instructions Given to Patient: Yes  Communication Precautions: Other (comment) (encourage patient to speak loudly and slowly)  Other Precautions: falls, decreased LE strength/sensation, orthostatic hypotension, ABD binder, TED hose and ACE wraps as needed    Rehab Diagnosis: 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      Collaborative goal setting: Open ended questions used to ID premorbid roles - pt reports that she uses laptop to continue her community involvement (book club, church group via [employer]). Transport to medical appointments was managed via insurance benefits, dtr provided meals. Require additional clarity and discussion to ID who took care of medication administration.    External aid: Introduced to hospital binder and tracking sheet for PO intake to increase insight and awareness into PO behaviors and caloric intake patterns causing nutritional deficiencies and contributing to hemodynamic instability. Required direct instruction on problem solving in 2/2 incidental opportunities to ID practical solution. Reminder needed for donning glasses to accurately use tracking sheet with wh-q probes to sustain engagement and completion X 2/2 opportunities. Placed  generated phrase handout also in binder.  Noted pt was citing use of external memory aids such as her own personal planner, but when asked to use if for tracking, declining, stating "I'll ask my dtr to get another one"; abandoning tracking attempts till clinician generated solution.    Cognition: Pt unable to cite medical diagnoses, unaware of reason for clinician to encourage fluid and PO intake. Required extensive education on comorbidities causing changes to function and putting her at risk for reduced BP stability + malnutrition, despite RN handoff indicating that this has been addressed.     Functional phrases generated with occasional models, clinician providing situational probes, rest break. Noted clear vocal quality during sustained phonation and no asymmetry or deficiency in velar movement with visual inspection; however during conversation, pt's resonance appears atypical, in addition to articulatory imprecision. Refusing to use delayed auditory feedback this session, however with education re: rationale and modifying plan to having her record self on her own phone camera versus using Voice Analyst app on clinician's phone, pt agreeable to attempt this next session for improving her insight.      Encounter Problems       Encounter Problems (Active)       Memory       LTG:  Patient will demonstrate increased advocacy in compensatory cognitive strategies during family training sessions to improve recall and attention during structured tasks as reported in post task analysis in at least 1-2 sessions.       Start:  01/09/22            STG:  Patient will  implement cognitive strategies (reducing background noise, external aids, repetition) in order to participate in structured tasks evidenced via verbalization in at least 2/5 opportunities over the course of 2 sessions.        Start:  01/09/22    Expected End:  01/12/22               Speech/Language       LTG:  Patient will demonstrate improved speech  intelligibility evidenced by communication partners reporting increase from 75% intelligibility and/or a 3-5 point increase on CPIB or QOL-dys.        Start:  01/09/22            STG:  Patient will demonstrate use of compensatory speech strategies (Be clear) during structured tasks at the sentence level to increase intelligibility to 80% accuracy provided cues no more than 50% of the time.        Start:  01/09/22    Expected End:  01/12/22                   Education Documentation  Cognitive function, taught by Barbara CowerKucheria, Candace Begue, SLP at 01/12/2022  4:23 PM.  Learner: Patient  Readiness: Acceptance  Method: Explanation, Demonstration  Response: Verbalizes Understanding, Demonstrated Understanding, Needs Reinforcement    Communication strategies, taught by Barbara CowerKucheria, Sreya Froio, SLP at 01/12/2022  4:22 PM.  Learner: Patient  Readiness: Acceptance  Method: Explanation, Handout  Response: Verbalizes Understanding  Comment: created functional phrase list    Education Comments  No comments found.        Assessment & Plan   Pt with a single SLP session since evaluation, hence limited opportunity to address goals. Pt superficially aware of changes to cognition and speech but requires assist to make detailed plans or initiate steps to address, along with reduced insight and self-monitoring into immediate needs or self-induced barriers (low PO intake) to healing and recovery, with anxiety posing as a self-limiting factor. Barrier to discharge at this time along with physical deconditioning is cognitive, concurrently, particularly ability to self-monitor her own health parameters. Anticipate that she will need S with ADLs upon discharge, goal of attaining mod I for IADLs (medications) and HH SLP with an ELOS of 10 days from a speech therapy standpoint.    Plan:  Risks/Benefits/POC Discussed with Pt/Family: With patient  Treatment/Interventions: Speech/Language evaluation, Speech/Language treatment, Cognitive linguistic  retraining    Recommendation:  Discharge Recommendation: Home with supervision  SLP Therapy Recommendation: 60-120 mins/day, 1:1 treatment, Group therapy (3x/week)  SLP Estimated Length of Stay: 7-10 days    Swallow Recommendations:   Diet Solids Recommendation: regular  Diet Liquids Recommendations: thin consistency  Precautions/Compensations: Awake/alert, Upright 90 degrees for all oral intake  Recommended Form of Meds: PO, whole, with liquid    Groups appropriate for patient:   Communication Group, Cognition Group    Group Justification: Practice /carry over skills learned in individual sessions with new partners, clinicians, and context  Practice strategies to manage communication/cognitive challenges in a distracting environment  Experience modeling by peers  Carryover skills learned in individual sessions in group format

## 2022-01-12 NOTE — Plan of Care (Signed)
Problem: Pain Management  Goal: LTG: Pt will achieve good pain control with current medication regimen as evidenced by ability to participate in 100% of therapy sessions.  Outcome: Progressing  Note: Provided patient education on side effects of pain medication. Encouraged patient to drink plenty of fluids to help prevent constipation and episodes of hypotension.      Problem: Safety Risk  Goal: LTG: Pt will recognize limitations and use call bell 100% of the time prior to transfers in order to prevent fsalls  Outcome: Progressing  Note: Patient uses call light when necessary to help prevent falls.

## 2022-01-12 NOTE — OT Progress Note (Signed)
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 Assisted with LB dressing and footwear at bed level with min A for managing foley and VC for technique. Donned shirt with set-upA. Monitored BP throughout session. See vitals section above for details    In prep for OOB therapy, donned TED hose, abdominal binder, and BLE ace wraps. Discussed BP medications with RN Wilkie Aye who was helpful with scheduling medications in prep for standing activities.     She transitioned from semi supine>EOB>wheelchair via lateral scoots with grossly CGA.     She practiced wheelchair propulsion with BUE to encourage endurance as well as strengthening. She propelled over 150 ft with minimal rest breaks before completed 1 set of 30s of forward rowing, backward rowing, shoulder press, and anterior  leans.     BP remaining stable throughout. Handed off to RN for BP medications.    Assessment & Plan   Patient's BP slightly low, but remained within functional limits today with the increase of BP medications, BLE TED hose, BLE ACE wraps, and abdominal binder. Continue OT POC with focus on cardiopulmonary endurance, gross strengthening, and hemodynamic stability.    Plan:  Risks/Benefits/POC Discussed with Pt/Family: With patient  Treatment Interventions: ADL retraining, Functional transfer training, UE strengthening/ROM, Endurance training, Cognitive reorientation, Patient/Family training, Equipment eval/education, Neuro muscular reeducation, Fine motor coordination activities, Compensatory technique education    Recommendation:  Discharge Recommendation: Home with home health OT, Home with home health PT  OT Therapy Recommendation: 5-6 days/week, 60-120 mins/day, 1:1 treatment, Group therapy  OT Estimated Length of Stay: 14 days

## 2022-01-12 NOTE — PT Progress Note (Signed)
Physical Therapy  Inpatient Rehabilitation Daily Progress Note    Patient Name:  [name]       Medical Record Number: [id number]   Date of Birth: [birth year]  Sex: Female          Room/Bed:  M501/M501-02    Therapy Received:  Start Time: 1100   Stop Time: 1200  Total Therapy Minutes: 60    Rehabilitation Precautions/Restrictions:  Weight Bearing Status: no restrictions  Precaution Instructions Given to Patient: Yes  Communication Precautions: Other (comment) (encourage patient to speak loudly and slowly)  Other Precautions: falls, decreased LE strength/sensation, orthostatic hypotension, ABD binder, TED hose and ACE wraps as needed    Rehab Diagnosis: 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 She's too tired. She states she wants to stay up for lunch and throughout SLP rx.   Patient/Caregiver Goals: I want to be independent  Pain Assessment:  Pain Assessment: No/denies pain    Objective   Vitals:    At rest  HR 103  BP 115/69  In sitting    After activity  Heart Rate: (!) 106  BP: 107/72  MAP (mmHg): 83  Patient Position: Sitting    Interventions: Pt handoff by OT, [name] appreciated. Pt found seated up in w/c with abd binder, B TED hose and B ACE wraps on LEs. Pt dependently taken in w/c to [facility] gym.    She performed 2 x 10 reps of B LE: AP and TKE prior to standing.     She practiced sit to stand transfer in // bars with CGA and stood for 10 sec, 40 sec and 30 sec x 2 with CGA. Pt c/o mild dizziness first two trials and then fatigue after 3rd and 4th trial. Pt encouraged to try and take a few steps, but adamantly refused.     She practiced w/c mobility propelling w/c 177ft with SBA.    Pt asking to return to room. Upon returning to room pt educated on tips to keep BP up: AP, TKE and increasing fluid intake. She verbalized understanding and agreement. Tips written on her white board.    Education Occupational psychologist, taught by Dan Humphreys, PT at 01/12/2022 12:43 PM.  Learner: Patient  Readiness: Acceptance  Method: Explanation, Demonstration  Response: [name] Understanding, Needs Reinforcement    Safety issues and interventions, taught by Dan Humphreys, PT at 01/12/2022 12:43 PM.  Learner: Patient  Readiness: Acceptance  Method: Explanation, Demonstration  Response: Verbalizes Understanding, Needs Reinforcement    Rehab techniques/procedure, taught by Dan Humphreys, PT at 01/12/2022 12:43 PM.  Learner: Patient  Readiness: Acceptance  Method: Explanation, Demonstration  Response: Verbalizes Understanding, Needs Reinforcement    Precautions, taught by Dan Humphreys, PT at 01/12/2022 12:43 PM.  Learner: Patient  Readiness: Acceptance  Method: Explanation, Demonstration  Response: Verbalizes Understanding, Needs Reinforcement    Plan of care, taught by Dan Humphreys, PT at 01/12/2022 12:43 PM.  Learner: Patient  Readiness: Acceptance  Method: Explanation, Demonstration  Response: Verbalizes Understanding, Needs Reinforcement    Functional transfers/mobility, taught by Dan Humphreys, PT at 01/12/2022 12:43 PM.  Learner: Patient  Readiness: Acceptance  Method: Explanation, Demonstration  Response: Verbalizes Understanding, Needs Reinforcement    Fall prevention/balance training, taught by Dan Humphreys, PT at 01/12/2022 12:43 PM.  Learner: Patient  Readiness: Acceptance  Method: Explanation, Demonstration  Response: Verbalizes Understanding, Needs Reinforcement    Equipment,  taught by Dan HumphreysKhandwala, Ivo Moga, PT at 01/12/2022 12:43 PM.  Learner: Patient  Readiness: Acceptance  Method: Explanation, Demonstration  Response: Verbalizes Understanding, Needs Reinforcement    Education Comments  No comments found.         Assessment & Plan   Pt tolerated OOB activity up in w/c. She is motivated to stay up in the w/c, but displays self-limiting behavior when it comes to attempting steps. She tolerated  standing with CGA for 10-40 sec, but adamantly declined taking steps. Pt's BP stable as above. Recommend continued therapy to maximize her functional mobility and independence.     Plan:  Risks/Benefits/POC Discussed with Pt/Family: With patient  Treatment/Interventions: Gait training, Exercise, Stair training, Neuromuscular re-education, Functional transfer training, LE strengthening/ROM, Endurance training, Patient/family training, Equipment eval/education, Bed mobility, Compensatory technique education, Continued evaluation    Recommendation:  Discharge Recommendation: Home with home health PT, SNF (will continue to assess)  PT Therapy Recommendation: 5-6 days/week, 60-120 mins/day, 1:1 treatment  PT Estimated Length of Stay: 10-14 days from IE on 01/04/2022

## 2022-01-13 LAB — CBC
Absolute NRBC: 0 10*3/uL (ref 0.00–0.00)
Hematocrit: 27.8 % — ABNORMAL LOW (ref 34.7–43.7)
Hgb: 8.9 g/dL — ABNORMAL LOW (ref 11.4–14.8)
MCH: 25.7 pg (ref 25.1–33.5)
MCHC: 32 g/dL (ref 31.5–35.8)
MCV: 80.3 fL (ref 78.0–96.0)
MPV: 9.4 fL (ref 8.9–12.5)
Nucleated RBC: 0 /100 WBC (ref 0.0–0.0)
Platelets: 433 10*3/uL — ABNORMAL HIGH (ref 142–346)
RBC: 3.46 10*6/uL — ABNORMAL LOW (ref 3.90–5.10)
RDW: 18 % — ABNORMAL HIGH (ref 11–15)
WBC: 8.81 10*3/uL (ref 3.10–9.50)

## 2022-01-13 LAB — BASIC METABOLIC PANEL
Anion Gap: 11 (ref 5.0–15.0)
BUN: 8 mg/dL (ref 7.0–21.0)
CO2: 16 mEq/L — ABNORMAL LOW (ref 17–29)
Calcium: 9.2 mg/dL (ref 8.5–10.5)
Chloride: 106 mEq/L (ref 99–111)
Creatinine: 0.7 mg/dL (ref 0.4–1.0)
Glucose: 92 mg/dL (ref 70–100)
Potassium: 4.4 mEq/L (ref 3.5–5.3)
Sodium: 133 mEq/L — ABNORMAL LOW (ref 135–145)

## 2022-01-13 LAB — GFR: EGFR: >60

## 2022-01-13 MED ORDER — TAMSULOSIN HCL 0.4 MG PO CAPS
0.4000 mg | ORAL_CAPSULE | Freq: Every day | ORAL | Status: DC
Start: 2022-01-13 — End: 2022-01-26
  Administered 2022-01-13 – 2022-01-26 (×14): 0.4 mg via ORAL
  Filled 2022-01-13 (×14): qty 1

## 2022-01-13 NOTE — Progress Notes (Signed)
MEDICINE PROGRESS NOTE  [facility], DIVISION OF HOSPITALIST MEDICINE   [facility]   [place] Pager: [phone]      Date Time: 01/13/22 3:11 PM  Patient Name: [name]  Attending Physician: Anastasia Pall, [facility]  Assessment:     Active Hospital Problems    Diagnosis    Myositis     66 year old female with past medical history significant for diabetes, hypertension, nonischemic cardiomyopathy, paroxysmal A-fib, anemia, bipolar, TIAs, patient presented to [facility] hospital on 2/7 with lower extremity weakness, paresthesias, transaminitis thought to have 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  Patient was subsequent discharged on 2/17 will need to go back to the hospital on 2/18 for continued management of rhabdomyolysis.  Patient noted to have left-sided weakness with associated difficulty with walking, standing, also developing bilateral hand and feet paresthesias and hand weakness she subsequent presented [facility] and was seen by neurology, work-up including MRIs, lumbar puncture done with no clear etiology subsequent with transfer to [facility] for consideration for IVIG.  While at [facility] first dose of IVIG was started outpatient setting developed allergic reaction therefore discontinued.  Diagnosis at time of discharge unclear include possible statin induced myositis versus inflammatory myositis versus immune mediated myositis.  She was noted to have multiple vitamin deficiencies including D, zinc, B6, iron deficiency which was treated.  Patient was discharged to acute rehab on 3/14.    Plan:     Myositis: Unclear etiology including possible statin induced myositis versus inflammatory arthritis versus immediate myositis  -Attempted IVIG at [facility] unsuccessful due to development of allergic reaction  -Patient's access was removed - as the initial plan to plasma exchange was cancelled. 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 role of biopsy    Hyponatremia  Na 133  F/u urine studies   Fluid restrict 1000 cc     Also with metabolic acidosis  -monitor  -consider Sod bicarbonate po 650 mg bid if remains low  -no s/o infection    Vitamin D, B6, B1, zinc deficiencies  -Continue multivitamin supplement  -Continue thiamine(received IV replacement 3/4-3/9), vitamin B6, zinc supplements     Normocytic anemia  -Noted TSAT of 21%, ferritin 492, normal folate level on 3/10; normal vitamin B12 level 3/4  -cont ferrous sulfate  -Hb has been stable     Moderate protein calorie malnutrition  -Weight loss, was on Ozempic but discontinued  -Nutrition consult  -Currently on Remeron    Paroxysmal A-fib--currently in sinus  -cont metoprolol XL 125 mg in evening (increased from 100 mg on 3/17)  -Continue Eliquis  -Follow heart rate    Orthostatic hypotension  -Responding to midodrine 2.5 mg in am and afternoon for now (continuing metoprolol with tachycardia and PAF)  -Noted today's blood pressure seems to be more stable with systolic blood pressure 100-130's     Urinary retension  -Currently has a Foley catheter, failed voiding trial on 3/13   -would restart the Flomax tonight as her orthostasis seems to have resolved, in anticipation of voiding trial early next week.     COPD--Not in exacerbation   -continue Symbicort  -prn duonebs  Pt had c/o shortness of breath on 3/21-resolved  -no hypoxia noted/no  fever  -Chest x-ray was done 3/21-no acute changes  -Patient subsequently felt better  -has been using her inhaler, no wheezing noted      History of nonischemic cardiopathy  -Echo 12/31/2021 with EF 43%, mild global hypokinesis  -Noted per cardiology note  3/13 with no further plans for ischemic work-up as patient had cardiac catheterization in 2022 with no obstructive CAD--outpatient follow-up for nonischemic cardiomyopathy work-up  -Repeat echo as outpatient     Bipolar disorder  -Continue home meds including Seroquel, ropinirole; home latuda 120mg  daily(pt has home med)     Diabetes--Hemoglobin A1c 5.3 on 3/6  -Patient had been on Ozempic but stopped due to malnutrition/weight loss  -No need for sliding-scale     Discussed with patient, RN    Subjective     CC: Myositis    Patient with no complaints of dizziness/lightheadedness today.  Patient denies any chest pain or shortness of breath.  No cough    Review of Systems:   Review of Systems - Negative except as above in HPI    Physical Exam:     Temp:  [98.2 F (36.8 C)-99.5 F (37.5 C)] 99.5 F (37.5 C)  Heart Rate:  [97-111] 105  Resp Rate:  [18] 18  BP: (100-145)/(59-85) 131/84    Intake/Output Summary (Last 24 hours) at 01/13/2022 1511  Last data filed at 01/13/2022 1432  Gross per 24 hour   Intake 980 ml   Output 1500 ml   Net -520 ml      General: alert and awake, no distress  HEENT: anicteric, moist mucous membranes  Neck: supple, no JVD  CVS: nl S1, S2; no gallop or rub  Lungs: clear to auscultation b/l, no wheezing or rhonchi; normal resp effort  Abd: soft, non-tender, non-distended  Ext: no pedal edema, no cyanosis  Neuro: alert and awake, moving all extremities       Meds:   Medications were reviewed:  Scheduled Meds:  Current Facility-Administered Medications   Medication Dose Route Frequency    apixaban  5 mg Oral Q12H SCH    budesonide-formoterol  2 puff Inhalation BID    ferrous sulfate  324 mg Oral QAM W/BREAKFAST    lidocaine  1 patch Transdermal Q24H    lurasidone  120 mg Oral QPM    metoprolol succinate  125 mg Oral QHS    midodrine  5 mg Oral BID Meals    nystatin  500,000 Units Oral QID    pantoprazole  40 mg Oral QAM AC    potassium chloride  40 mEq Oral Daily    QUEtiapine  100 mg Oral QHS     rOPINIRole  1 mg Oral QHS    senna-docusate  2 tablet Oral QHS    thiamine  100 mg Oral Daily    vitamin B-6  50 mg Oral Daily    vitamin D  25 mcg Oral Daily    vitamins/minerals  1 tablet Oral Daily at 1700    zinc sulfate  220 mg Oral Daily at 1700     Continuous Infusions:  PRN Meds:.acetaminophen, albuterol-ipratropium, bisacodyl, bisacodyl, diphenhydrAMINE, gabapentin, HYDROmorphone, lactulose, LORazepam, melatonin, promethazine  Labs/Radiology:   Imaging personally reviewed, including: all available   No results found.  No results for input(s): GLUCOSEWB in the last 24 hours.  Recent Labs   Lab 01/13/22  0436 01/12/22  1759 01/10/22  1519   Sodium 133* 130* 129*   Potassium 4.4 5.3 4.9   Chloride 106 106  104   BUN 8.0 8.0 8.0   Creatinine 0.7 0.8 0.7   EGFR >60.0 >60.0 >60.0   Glucose 92 117* 88   Calcium 9.2 9.5 9.5       Recent Labs   Lab 01/13/22  0436 01/12/22  1759 01/10/22  1519   WBC 8.81 10.14* 9.30   Hgb 8.9* 10.0* 9.9*   Hematocrit 27.8* 31.6* 30.8*   Platelets 433* 333 369*                   This note was generated by the Epic EMR system/ Dragon speech recognition and may contain inherent errors or omissions not intended by the user. Grammatical errors, random word insertions, deletions and pronoun errors  are occasional consequences of this technology due to software limitations. Not all errors are caught or corrected. If there are questions or concerns about the content of this note or information contained within the body of this dictation they should be addressed directly with the author for clarification.    Signed by: Laretta Bolster, MD

## 2022-01-13 NOTE — OT Progress Note (Signed)
Occupational Therapy  Inpatient Rehabilitation Daily Progress Note    Patient Name:  [name]       Medical Record Number: [id number]   Date of Birth: 1956-08-08  Sex: Female          Room/Bed:  M501/M501-02    Therapy Received:  Start Time: 1100   Stop Time: 1150  Total Therapy Minutes: 50    Rehabilitation Precautions/Restrictions:  Weight Bearing Status: no restrictions  Precaution Instructions Given to Patient: Yes  Communication Precautions: Other (comment) (encourage patient to speak loudly and slowly)  Other Precautions: falls, decreased LE strength/sensation, orthostatic hypotension, ABD binder, TED hose and ACE wraps as needed    Rehab Diagnosis: 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]  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]     Subjective   Patient Report: I am so cold and tired, I just can't do anymore  Patient/Caregiver Goals: I want to be independent  Pain Assessment:  Pain Assessment: Numeric Scale (0-10)  Pain Score: 9-severe pain  Pain Location: Back  Pain Orientation: Lower  Pain Descriptors: Constant  Effect of Pain on Daily Activities: moderate  Pain Intervention(s): Medication (See eMAR), Heat applied, Repositioned, Ambulation/increased activity, Emotional support, Environmental changes, Warm pack    Objective   Vitals:  Heart Rate Source: Right, Brachial  BP: 131/84  BP Location: Right arm  BP Method: Automatic  Patient Position: Sitting    Interventions:     Assessment:    IADL - s/u from w/c level for front loading washing machine - note on board for laundry location     01/13/22 1100   Eating   Assistance Needed Independent   Physical Assistance Level No physical assistance   CARE Score - Eating 6   Upper Body Dressing   Assistance Needed Set-up / clean-up   Physical Assistance Level No physical assistance   CARE Score - Upper Body Dressing 5   Putting On/Taking Off Footwear   Assistance Needed Set-up / clean-up   Physical Assistance Level No physical assistance   Comment brings BLE to modified figure 4 - includes  laces   CARE Score - Putting On/Taking Off Footwear 5   Sit to Lying   Assistance Needed Supervision   Physical Assistance Level No physical assistance   Comment HOBE   CARE Score - Sit to Lying 4   Lying to Sitting on Side of Bed   Assistance Needed Supervision   Physical Assistance Level No physical assistance   Comment HOBE   CARE Score - Lying to Sitting on Side of Bed 4   Chair/Bed-to-Chair Transfer   Assistance Needed Incidental touching   Physical Assistance Level 25% or less   Comment SQPVT > L > R- increased assist 2/2 fatigue   CARE Score - Chair/Bed-to-Chair Transfer 3       Assessment & Plan   Pt is received in pain and not eligible for pain meds at this time per RN. Pt engaging in limited therapy citing pain, feeling cold and extreme fatigue. Cont with OT POC    Plan:  Risks/Benefits/POC Discussed with Pt/Family: With patient  Treatment Interventions: ADL retraining, Functional transfer training, UE strengthening/ROM, Endurance training, Cognitive reorientation, Patient/Family training, Equipment eval/education, Neuro muscular reeducation, Fine motor coordination activities, Compensatory technique education    Recommendation:  Discharge Recommendation: Home with home health OT, Home with home health PT  OT Therapy Recommendation: 5-6 days/week, 60-120 mins/day, 1:1 treatment, Group therapy  OT Estimated Length of Stay: 14 days

## 2022-01-13 NOTE — PT Progress Note (Signed)
Physical Therapy  Inpatient Rehabilitation Daily Progress Note    Patient Name:  [name] BirksCarol Pettet       Medical Record Number: [id number]   Date of Birth: 03/01/1956  Sex: Female          Room/Bed:  M501/M501-02    Therapy Received:  Start Time: 1000   Stop Time: 1100  Total Therapy Minutes: 60    Rehabilitation Precautions/Restrictions:  Weight Bearing Status: no restrictions  Precaution Instructions Given to Patient: Yes  Communication Precautions: Other (comment) (encourage patient to speak loudly and slowly)  Other Precautions: falls, decreased LE strength/sensation, orthostatic hypotension, ABD binder, TED hose and ACE wraps as needed    Rehab Diagnosis: 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 Pt sitting up in W/C at bedside upon arrival, agreeable to treatment. BP checked throughout session and noted to be stable (106-108 SBP in sitting).  Pt Dep brought to [facility] therapy gym in W/C.  Pt practiced sit <-> stands in ll bars x5 reps with Min A. Pt stood for 10-30 sec each rep. Pt progressed from static standing tolerance to being able to practice steps forward. Pt noted to take very short steps on L, so on next trial, pt instructed to step fwd and back with L foot only to isolate L step quality. Pt was able to perform normal sized steps x5 reps before needing to sit down. At this time, pt c/o not feeling well. Vitals checked and noted to be stable. Pt c/o lightheadedness, mild SOB and chest pain and requested to lie down. Pt acknowledged that symptoms may be a result of anxiety.   Pt was brought back to her room  and transferred scooting techn to R side with arm rest removed and CGA. Pt performed sit -> supine with Supv. RN notified of pt's complaints. Vitals re-checked in supine and noted to be WNL. Pt reports symptoms improving once in bed.   Pt agreeable on instruction of HEP in supine. Pt performed 2x10 reps each B of AP's, heel slides, hip abd/add (AAROM progressing to AROM), and bridging (with assist to stabilize feet). Ex's written on pt's white board in the room and encouraged pt to perform on own 2x/day for strengthening.  Pt was left in bed with all needs met.       Education Documentation  Rehab techniques/procedure, taught by Geronimo Runningheuer, Rebel Willcutt, PT at 01/13/2022 11:01 AM.  Learner: Patient  Readiness: Acceptance  Method: Explanation, Teach Back, Demonstration  Response: Verbalizes Understanding, Demonstrated Understanding, Able to Teach Back, Needs Reinforcement    Home exercise program, taught by Geronimo Runningheuer, Xylina Rhoads, PT at 01/13/2022 11:01 AM.  Learner: Patient  Readiness: Acceptance  Method: Explanation, Teach Back, Demonstration  Response: Verbalizes Understanding, Demonstrated Understanding, Able to Teach Back, Needs Reinforcement    Functional transfers/mobility, taught by Geronimo Runningheuer, Zeidy Tayag, PT at 01/13/2022 11:01 AM.  Learner: Patient  Readiness: Acceptance  Method: Explanation, Teach Back, Demonstration  Response: Verbalizes Understanding, Demonstrated Understanding, Able to Teach Back, Needs Reinforcement    Fall prevention/balance training, taught by Clorox Companyheuer,  Rayfield Citizen, PT at 01/13/2022 11:01 AM.  Learner: Patient  Readiness: Acceptance  Method: Explanation, Teach Back, Demonstration  Response: Verbalizes Understanding, Demonstrated Understanding, Able to Teach Back, Needs Reinforcement    Education Comments  No comments found.         Assessment & Plan   Pt c/o dizziness during session following standing activities, however no changes seen in BP. Pt c/o feeling ill and required transport back to bed. Pt can benefit  from encouragement to continue performing HEP for strengthening and for spending more time OOB. Recommend continued PT services to address functional mobility deficits in preparation for return to home.    Plan:  Risks/Benefits/POC Discussed with Pt/Family: With patient  Treatment/Interventions: Gait training, Exercise, Stair training, Neuromuscular re-education, Functional transfer training, LE strengthening/ROM, Endurance training, Patient/family training, Equipment eval/education, Bed mobility, Compensatory technique education, Continued evaluation    Recommendation:  Discharge Recommendation: Home with home health PT, SNF (will continue to assess)  PT Therapy Recommendation: 5-6 days/week, 60-120 mins/day, 1:1 treatment  PT Estimated Length of Stay: 10-14 days from IE on 01/04/2022

## 2022-01-13 NOTE — Progress Notes (Signed)
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 The patient was found seated in her bed. She was alert and cooperative. [name] was oriented to person, place, time, date and situation. Affect was euthymic, and she made appropriate eye contact. She was engaging throughout the session and demonstrated spontaneous speech. Personal grooming and hygiene were good. Speech was of normal rhythm, volume, rate, and prosody.  Processing speed was within normal limits. Behavioral signs of pain were not observed. There was no evidence of her responding to internal stimuli, and there was no delusional content in her speech. At the time of the session, the patient did not appear to be in or report any acute distress. Thought process was linear and goal oriented; content was  appropriate. Patient did not endorse any suicidal ideation, intent, and/or plan.      Assessment  Impressions: Relevant background information was gathered. The patient reported an "okay" mood and endorsed symptoms of adjustment related anxiety. This included rehabilitation, recovery, and thoughts of the future. She described biopsychosocial stressors and adjustment to management of medical symptoms. The patient confirmed a history of Bipolar diagnosis (first diagnosed 1980's) and effective medication management. The patient was given space to express frustrations and concerns.  She was provided with counseling from a person centered and cognitive behavioral perspective. This clinician explored and reflected the patient's emotional process with adjustment. Support and encouragement were provided. Goals and personal values were explored, and coping strategies were integrated. Patient's hospital course and therapeutic process were discussed at length. The patient did not endorse other symptoms of psychopathology. The patient is in agreement with the plan of care and rehabilitation process.      Plan  Increase adaptive coping and adjustment to recovery process and medical symptoms.    Recommendations: Individual and co-treat as needed

## 2022-01-13 NOTE — Plan of Care (Signed)
Problem: Pain Management  Goal: LTG: Pt will achieve good pain control with current medication regimen as evidenced by ability to participate in 100% of therapy sessions.  Outcome: Progressing  Note: Patient has complaints of back pain and will request prn pain medication. Will continue to monitor patients pain level.

## 2022-01-13 NOTE — Progress Notes (Signed)
PHYSICAL MEDICINE AND REHABILITATION  PROGRESS NOTE  FACE TO Bruce Donath    Date Time: 01/13/22 3:06 PM    Patient Name: [name],[name]  Admission date:  01/03/2022  (LOS: 10 days)    Subjective:     Patient seen at noon with lunch  She was concerned about a possible discharge next week, discussed with case management     Functional Status:     Interventions: Session focused on adls.  See below for details.  Ot placed ted hose and abd binder on pt.  Scooting on bed to left and right with supervision.  Scooted to w/c with supervision.  BUE's rom exercises to help increase bp, increase strength/endurance.      Medications:   Medication reviewed by me:     Scheduled Meds: PRN Meds:    apixaban, 5 mg, Oral, Q12H SCH  budesonide-formoterol, 2 puff, Inhalation, BID  ferrous sulfate, 324 mg, Oral, QAM W/BREAKFAST  lidocaine, 1 patch, Transdermal, Q24H  lurasidone, 120 mg, Oral, QPM  metoprolol succinate, 125 mg, Oral, QHS  midodrine, 5 mg, Oral, BID Meals  nystatin, 500,000 Units, Oral, QID  pantoprazole, 40 mg, Oral, QAM AC  potassium chloride, 40 mEq, Oral, Daily  QUEtiapine, 100 mg, Oral, QHS  rOPINIRole, 1 mg, Oral, QHS  senna-docusate, 2 tablet, Oral, QHS  thiamine, 100 mg, Oral, Daily  vitamin B-6, 50 mg, Oral, Daily  vitamin D, 25 mcg, Oral, Daily  vitamins/minerals, 1 tablet, Oral, Daily at 1700  zinc sulfate, 220 mg, Oral, Daily at 1700        Continuous Infusions:   acetaminophen, 650 mg, Q4H PRN  albuterol-ipratropium, 3 mL, Q6H PRN  bisacodyl, 10 mg, Daily PRN  bisacodyl, 10 mg, Daily PRN  diphenhydrAMINE, 25 mg, Q6H PRN  gabapentin, 100 mg, TID PRN  HYDROmorphone, 2 mg, Q6H PRN  lactulose, 20 g, Q4H PRN  LORazepam, 0.5 mg, QHS PRN  melatonin, 3 mg, QHS PRN  promethazine, 25 mg, Q6H PRN          Medication Review  A complete drug regimen review was completed: Yes  Were drug issues were found during review: No   If yes to drug issues found during review:  What was the issue?:   What was the time the issue was  identified?:   Was I contacted and action was taken by midnight of the next calendar day once issue was identified?: N/A   Person who contacted me:   Action taken:     Review of Systems:   A comprehensive review of systems was: No fevers, chills, nausea, vomiting, chest pain, shortness of breath, cough, headache, double vision.  All others negative.    Labs:     Recent Labs   Lab 01/13/22  0436 01/12/22  1759 01/10/22  1519   WBC 8.81 10.14* 9.30   Hgb 8.9* 10.0* 9.9*   Hematocrit 27.8* 31.6* 30.8*   Platelets 433* 333 369*      Recent Labs   Lab 01/13/22  0436 01/12/22  1759 01/10/22  1519   Sodium 133* 130* 129*   Potassium 4.4 5.3 4.9   Chloride 106 106 104   CO2 16* 12* 14*   BUN 8.0 8.0 8.0   Creatinine 0.7 0.8 0.7   Calcium 9.2 9.5 9.5   Glucose 92 117* 88         Rads:   Radiological Procedure reviewed.  Radiology Results (24 Hour)       ** No results found  for the last 24 hours. **          Physical Exam:     Vitals:    01/13/22 1009 01/13/22 1027 01/13/22 1037 01/13/22 1100   BP: 108/69 105/70 134/84 131/84   Pulse: (!) 108 (!) 108 (!) 105    Resp:       Temp:       TempSrc:       SpO2: 97% 97% 100%    Weight:       Height:           Intake and Output Summary (Last 24 hours) at Date Time    Intake/Output Summary (Last 24 hours) at 01/13/2022 1506  Last data filed at 01/13/2022 1432  Gross per 24 hour   Intake 980 ml   Output 1500 ml   Net -520 ml     P.O.: 240 mL (01/13/22 1220)     Urine: 0 mL (01/13/22 0720)             Gen: Alert, NAD  Psych: Pleasant and cooperative  Skin: Pink, warm, no rashes  HEENT: Atraumatic, normocephalic,   Chest: symmetric bilateral chest rise and fall,   CV: Regular rate and rhythm, no murmurs  Resp: breath sounds equal throughout; non labored  Abd: non-distended, soft nontender  Extr: Pink, warm,  Neuro:  Fluent speech     Assessment and Plan:     [name] is a 66 y.o. female with myositis    #Rehab  Multidisciplinary rehab program  Physical Therapy focused on improving  strength, endurance, balance, functional mobility and transfers for 60-120 mins/day, 5-6 days/week, for 14 days.   Occupational Therapy focused on improving strength, endurance, transfers, activities of daily living, and cognition for 60-120 mins/day, 5-6 days/week, for 14 days.     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%   Metoprolol,   losartan held due to hypotension     # COPD  Symbicort      # bipolar disorder  Latuda   Seroquel     # Pain Management   Daily monitoring   gabapentin  Acetaminophen, dilaudid PRN     #Bowel Management  constipation - Senna, Colace, Bisacodyl suppository PRN; hold for loose stools    #Bladder Management  Check PVRs    #Skin Management  Frequent skin assessment  Moisture barrier PRN    #FEN/GI with severe malnutrition   cardiac diet    #DVT PPx  eliquis    #Dispo  Pending rehab progress      Continue comprehensive and intensive inpatient rehab program, including:   Physical therapy 60-120 min daily, 5-6 times per week, Occupational therapy 60-120 min daily, 5-6 times per week, Case management and Rehabilitation nursing.  Psychology services to evaluate and treat as needed     Signed by: Cheryll Dessert, MD   01/13/2022, 3:06 PM    [facility]

## 2022-01-13 NOTE — Progress Notes (Signed)
CM spoke with daughter, Morrell Riddle this afternoon and update provided from team conference amd recommended [place] on 4/4.  Daughter agreeable.  Referral in place to [facility] for [facility] services.

## 2022-01-13 NOTE — Plan of Care (Signed)
Problem: Pain Management  Goal: LTG: Pt will achieve good pain control with current medication regimen as evidenced by ability to participate in 100% of therapy sessions.  Outcome: Progressing   Lower back pain controlled with PRN dilaudid, education provided on side effects of pain medication with understanding verbalized   Problem: Safety Risk  Goal: LTG: Pt will recognize limitations and use call bell 100% of the time prior to transfers in order to prevent fsalls  Outcome: Progressing   Pt able to call for assistance appropriately and fall precautions maintained.  Problem: Skin Wound Management  Goal: LTG: Excoriation at buttocks will resolve by [place]  Outcome: Progressing   Peri area cleansed, dry and z guard applied relieving c/o itching and scratching per pt. Excoriation is improving with limited pull up [place] during the night

## 2022-01-13 NOTE — OT Progress Note (Signed)
Occupational Therapy  Inpatient Rehabilitation Daily Progress Note    Patient Name:  [name]       Medical Record Number: [id number]   Date of Birth: Sep 16, 1956  Sex: Female          Room/Bed:  M501/M501-02    Therapy Received:  Start Time: 0700   Stop Time: 0800  Total Therapy Minutes: 60    Rehabilitation Precautions/Restrictions:  Weight Bearing Status: no restrictions  Precaution Instructions Given to Patient: Yes  Communication Precautions: Other (comment) (encourage patient to speak loudly and slowly)  Other Precautions: falls, decreased LE strength/sensation, orthostatic hypotension, ABD binder, TED hose and ACE wraps as needed    Rehab Diagnosis: 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   Patient/Caregiver Goals: I want to be independent  Pain Assessment:  Pain Assessment: No/denies pain    Objective   Vitals:  Heart Rate: (!) 109  BP: 111/73  BP Location: Left arm  MAP (mmHg): 86  Patient Position: Sitting    Interventions: Session focused on adls.  See below for details.  Ot placed ted hose and abd binder on pt.  Scooting on bed to left and right with supervision.  Scooted to w/c with supervision.  BUE's rom exercises to help increase bp, increase strength/endurance.      Self Care Functional Status:   Current Status Current Status Discharge Goal   Functional Area: Care Score: Comments:    Eating 5    Independent   Oral Hygiene 5    Independent   Toileting Hygiene 2    Independent   Shower/Bathe Self 4    Supervision or touching assistance   Upper Body Dressing 3    Independent   Lower Body Dressing 3    Supervision or touching assistance   Putting On/Taking Off Footwear 3    Supervision or touching assistance     Mobility Functional Status:   Current   Status Current Status Discharge Goal   Functional Area: Care Score: Comments:    Roll Left and Right 88        Sit to Lying 4    Independent   Lying to Sitting on Side of Bed 6     Independent   Sit to Stand 88    Independent   Chair/Bed-to-Chair Transfer 4    Independent   Toilet Transfer 88    Independent   Car Transfer          Picking Up Object 88    Independent (with use of AE)                                       Education Documentation  Functional transfers/mobility, taught by Ila Mcgill D, OT at 01/13/2022  8:02 AM.  Learner: Patient  Readiness: Acceptance  Method: Explanation, Teach Back  Response: Verbalizes Understanding    Education Comments  No comments found.        Assessment & Plan   Pt tolerated session well today. No issues with bp-although pt with ted hose and abd binder when eob and up.  Pt would benefit from continued ot to maximize independence.     Plan:  Risks/Benefits/POC Discussed with Pt/Family: With patient  Treatment Interventions: ADL retraining, Functional transfer training, UE strengthening/ROM, Endurance training, Cognitive reorientation, Patient/Family training,  Equipment eval/education, Neuro muscular reeducation, Fine motor coordination activities, Compensatory technique education    Recommendation:  Discharge Recommendation: Home with home health OT, Home with home health PT  OT Therapy Recommendation: 5-6 days/week, 60-120 mins/day, 1:1 treatment, Group therapy  OT Estimated Length of Stay: 14 days    Groups appropriate for patient:   UE Group, Mobility Group

## 2022-01-13 NOTE — Progress Notes (Signed)
Elizabeth (DON) from [facility] called stating pt is no longer able to be accepted due to being out of network. Referral sent back out in allscripts. Will continue to follow up on referral.

## 2022-01-14 DIAGNOSIS — I951 Orthostatic hypotension: Secondary | ICD-10-CM

## 2022-01-14 DIAGNOSIS — R339 Retention of urine, unspecified: Secondary | ICD-10-CM

## 2022-01-14 LAB — OSMOLALITY, URINE: Urine Osmolality: 75 mosm/kg — ABNORMAL LOW (ref 300–1094)

## 2022-01-14 LAB — SODIUM, URINE, RANDOM: Urine Sodium Random: <20 mEq/L

## 2022-01-14 NOTE — Plan of Care (Signed)
Problem: Bladder Management  Goal: LTG: Pt will be 100% continent and independent with bladder elim at [place]  Outcome: Progressing     Problem: Cardiac Function  Goal: LTG: Pt will be independent with BP and pulse management and medication at [place]  Outcome: Progressing     Problem: Pain Management  Goal: LTG: Pt will achieve good pain control with current medication regimen as evidenced by ability to participate in 100% of therapy sessions.  Outcome: Progressing     Problem: Safety Risk  Goal: LTG: Pt will recognize limitations and use call bell 100% of the time prior to transfers in order to prevent fsalls  Outcome: Progressing     Problem: Compromised Tissue integrity  Goal: Damaged tissue is healing and protected  Outcome: Progressing     Problem: Communication  Goal: Patient will be able to communicate wants/needs regarding POC utilizing compenstatory speech strategies at the discourse level with caregiver support as indicated.  Outcome: Progressing

## 2022-01-14 NOTE — Progress Notes (Signed)
MEDICINE PROGRESS NOTE  [facility], DIVISION OF HOSPITALIST MEDICINE   [facility]   [place] Pager: [phone]      Date Time: 01/14/22 9:10 PM  Patient Name: [name]  Attending Physician: Anastasia Pall, E[facility]on [facility]ball[facility] Day: 12  Assessment:   65 year old female with past medical history significant for diabetes, hypertension, nonischemic cardiomyopathy, paroxysmal A-fib, anemia, bipolar, TIAs presented to [facility]  hospital on 2/7 with lower extremity weakness, paresthesias, transaminitis thought to have 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 Patient was discharged on 2/17 and went back to the hospital on 2/18 for continued management of rhabdomyolysis.  Patient noted to have left-sided weakness with associated difficulty with walking, standing, also developing bilateral hand and feet paresthesias and hand weakness,she was seen by neurology, work-up including MRIs, lumbar puncture done with no clear etiology,was transferred to [facility] for consideration for IVIG.  While at [facility], first dose of IVIG was started,but apparently,she developed allergic reaction therefore IVIG was discontinued. Diagnosis at the time of discharge stayed unclear,DDx was possible statin induced myositis vs. inflammatory myositis vs. immune mediated myositis.  She was also noted to have multiple vitamin deficiencies including D, zinc, B6, iron deficiency which has been replaced.  Patient was discharged to acute rehab on 3/14.    Recommendations:   Myositis: Unclear etiology including possible statin induced myositis versus inflammatory arthritis versus immediate myositis  -Attempted IVIG at [facility] unsuccessful due to development of allergic reaction  -Patient's access was removed - as the initial plan to plasma exchange was cancelled. 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 role of biopsy?      Hyponatremia  -Na 133 as of 4/23  -Fluid restrict 1000 cc  -check BMP in am      metabolic acidosis  -HCO3 was 12 and now 16  -consider Sod bicarbonate po 650 mg bid if remains low  -no s/o infection  -check BMP      Vitamin D, B6, B1, zinc deficiencies  -Continue multivitamin supplement  -Continue thiamine(received IV replacement 3/4-3/9), vitamin B6, zinc supplements       Normocytic anemia  -Noted TSAT of 21%, ferritin 492, normal folate level on 3/10; -normal vitamin B12 level 3/4  -cont ferrous sulfate  -Hgb has been stable       Moderate protein calorie malnutrition  -Weight loss, was on Ozempic but discontinued  -c/w glucerna and regular diet      Paroxysmal A-fib--currently in sinus  -cont metoprolol XL 125 mg in evening (increased from 100 mg on 3/17)  -Continue Eliquis  -Follow heart rate      Orthostatic hypotension  -Responding to midodrine 5 mg in am and afternoon for now (continuing metoprolol with tachycardia and PAF)  -Noted today's blood pressure,may need to increase midodrine, c/w compression stocking and abd binder  -she is on Flomax and need to monitor closely      Urinary retension  -Currently has a Foley catheter, failed voiding trial on 3/13   -has been restarted on Flomax in anticipation of voiding trial early next week.       COPD--Not in exacerbation   -continue Symbicort  -prn duonebs  Pt had c/o shortness of breath on 3/21-resolved  -no hypoxia noted/no fever  -  Chest x-ray was done 3/21-no acute changes  -Patient subsequently felt better  -has been using her inhaler, no wheezing noted      History of nonischemic cardiopathy  -Echo 12/31/2021 with EF 43%, mild global hypokinesis  -Noted per cardiology note 3/13 with no further plans for ischemic work-up as patient had cardiac  catheterization in 2022 with no obstructive CAD--outpatient follow-up for nonischemic cardiomyopathy work-up  -Repeat echo as outpatient     Bipolar disorder  -Continue home meds including Seroquel, ropinirole; home latuda 120mg  daily(pt has home med)     Diabetes--Hemoglobin A1c 5.3 on 3/6  -Patient was on Ozempic but stopped due to malnutrition/weight loss  -No need for sliding-scale     Case was discussed with patient and RN    Subjective/ROS/24hr events:     CC: Myositis    Patient with no complaints of dizziness/lightheadedness today.  Patient denies any chest pain or shortness of breath.  No cough      Physical Exam:     Temp:  [97.5 F (36.4 C)-98.2 F (36.8 C)] 97.5 F (36.4 C)  Heart Rate:  [76-114] 104  Resp Rate:  [16] 16  BP: (88-110)/(50-71) 106/69    Intake/Output Summary (Last 24 hours) at 01/14/2022 2110  Last data filed at 01/14/2022 1913  Gross per 24 hour   Intake 1300 ml   Output 2000 ml   Net -700 ml      General: alert and awake, no distress  HEENT: anicteric, moist mucous membranes  Neck: supple, no JVD  CVS: nl S1, S2; no gallop or rub  Lungs: clear to auscultation b/l, no wheezing or rhonchi; normal resp effort  Abd: soft, non-tender, non-distended  Ext: no pedal edema, no cyanosis  Neuro: alert and awake, moving all extremities       Meds:   Medications were reviewed:  Scheduled Meds:  Current Facility-Administered Medications   Medication Dose Route Frequency    apixaban  5 mg Oral Q12H SCH    budesonide-formoterol  2 puff Inhalation BID    ferrous sulfate  324 mg Oral QAM W/BREAKFAST    lidocaine  1 patch Transdermal Q24H    lurasidone  120 mg Oral QPM    metoprolol succinate  125 mg Oral QHS    midodrine  5 mg Oral BID Meals    pantoprazole  40 mg Oral QAM AC    QUEtiapine  100 mg Oral QHS    rOPINIRole  1 mg Oral QHS    senna-docusate  2 tablet Oral QHS    tamsulosin  0.4 mg Oral Daily after dinner    thiamine  100 mg Oral Daily    vitamin B-6  50 mg Oral Daily    vitamin D  25 mcg  Oral Daily    vitamins/minerals  1 tablet Oral Daily at 1700    zinc sulfate  220 mg Oral Daily at 1700     Continuous Infusions:  PRN Meds:.acetaminophen, albuterol-ipratropium, bisacodyl, bisacodyl, diphenhydrAMINE, gabapentin, HYDROmorphone, lactulose, LORazepam, melatonin, promethazine  Labs/Radiology:   Imaging personally reviewed, including: all available   No results found.  No results for input(s): GLUCOSEWB in the last 24 hours.  Recent Labs   Lab 01/13/22  0436 01/12/22  1759 01/10/22  1519   Sodium 133* 130* 129*   Potassium 4.4 5.3 4.9   Chloride 106 106 104   BUN 8.0 8.0 8.0   Creatinine 0.7 0.8 0.7   EGFR >60.0 >60.0 >60.0   Glucose 92  117* 88   Calcium 9.2 9.5 9.5       Recent Labs   Lab 01/13/22  0436 01/12/22  1759 01/10/22  1519   WBC 8.81 10.14* 9.30   Hgb 8.9* 10.0* 9.9*   Hematocrit 27.8* 31.6* 30.8*   Platelets 433* 333 369*           Signed by: Richarda Osmond, MD,FACP

## 2022-01-14 NOTE — SLP Progress Note (Addendum)
Speech Language Pathology  Inpatient Rehabilitation Daily Progress Note    Patient Name:  [name]       Medical Record Number: [id number]   Date of Birth: [birth year]  Sex: Female          Room/Bed:  M501/M501-02    Therapy Received:  Start Time: 0900   Stop Time: 1000  Total Therapy Minutes: 60    Rehabilitation Precautions/Restrictions:  Weight Bearing Status: no restrictions  Precaution Instructions Given to Patient: Yes  Communication Precautions: Speak slowly  Other Precautions: falls, decreased LE strength/sensation, orthostatic hypotension, ABD binder, TED hose and ACE wraps as needed    Rehab Diagnosis: 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    Interventions:     Stimulability testing at sentence level with strategy of slowing rate effective in improving clarity and reducing perceptual judgment of nasality during connected speech.    Assessment:  [place] Dysarthria Assessment Tool (N-DAT)  Oral Mechanism  Oral mechanism/Cranial Nerve Assessment: lingual weakness on left though unclear the degree to which this may be acute; muscle loss around temporalis and masseter exacerbated lack of lower dentures  Intelligibility  Spontaneous speech (picture description): primarily noted hypernasality in connected speech  Respiration  Breathing pattern: clavicular  Breathing rate per minute (Norm: 12-18): 15  Phonation  Observation of voice: mild breathiness  Loudness: adequate  Pitch: adequate  Quality: breathy  Average maximum phonation time (MPT): 12  Average S/Z ratio: s/z ratio is approx 0.5 taking highest s and z values  Ability to sustain volume (count to 5): able  Pitch Glide (high to low then  low to high) Ability: able  Pitch glide range: adequate  Pitch glide control: adequate  Resonance  Observation of nasality: adequate  Nasal flutter test:  (tremor)  Prosody  Ability to vary stress: adequate  Ability to vary intonation: adequate  Articulation  Observation of motor speech: AMRs:  Phrase length: adequate  Groping behaviors: adequate  Rate of movement ("buttercup" repetitions in 5 seconds): Fair (4-7 repetitions)  Sequential motion rates (SMRs) - Rate: slow  Sequential motion rates (SMRs) - Precision: regular  Repetitions of /puh, tuh, kuh/ in 5 seconds: 1.6    Education Documentation  Communication strategies, taught by Barbara Cower, SLP at 01/14/2022  2:17 PM.  Learner: Patient  Readiness: Acceptance  Method: Explanation, Teach Back  Response: Verbalizes Understanding, Needs Reinforcement, Demonstrated Understanding    Education Comments  No comments found.        Assessment & Plan   Based on results of motor speech testing, pt with hypernasality during connected speech/discourse, which is absent during isolated resonance tasks. Based on results of testing hypothesis of reduced coordination versus true vocal pathology influencing speech production, eliminating need at this time for an ENT consult. She was stimulable for rate control techniques to trigger improved coordination with positive effects on intelligibility. Be Clear protocol to emphasize reduced rate as primary strategy for improving intelligibility with initiation of training with reading tasks so pausing can be pre-planned.    Plan:  Risks/Benefits/POC Discussed with Pt/Family: With patient  Treatment/Interventions: Speech/Language treatment, Cognitive linguistic retraining    Recommendation:  Discharge Recommendation: Home with supervision  SLP Therapy Recommendation: 5-6 days/week, 60-120 mins/day, 1:1 treatment  SLP Estimated Length of Stay: 7-10 days    Swallow Recommendations:   Diet Solids Recommendation: regular  Diet Liquids  Recommendations: thin consistency  Precautions/Compensations: Awake/alert, Upright 90 degrees for all oral intake  Recommended Form of Meds: PO, whole, with liquid

## 2022-01-14 NOTE — PT Progress Note (Signed)
Physical Therapy  Inpatient Rehabilitation Daily Progress Note    Patient Name:  [name]       Medical Record Number: [id number]   Date of Birth: 04/11/1956  Sex: Female          Room/Bed:  M501/M501-02    Therapy Received:  Start Time: 1400   Stop Time: 1500  Total Therapy Minutes: 60    Rehabilitation Precautions/Restrictions:  Weight Bearing Status: (P) no restrictions  Precaution Instructions Given to Patient: (P) Yes  Communication Precautions: Speak slowly  Other Precautions: (P) falls, decreased LE strength/sensation, orthostatic hypotension, ABD binder, TED hose and ACE wraps as needed    Rehab Diagnosis: 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 A brief sensation screen was performed on the patients distal LE, and she has diminished sensation throughout the bilateral lower legs/feet. Today's session focused on weightbearing activity in the parallel bars as well as WC mobility through tight spaces. BP remained WNL throughout session (see flowsheet). Patient completes bed <>WC with squat pivot and CGA. Patient completed 2 x 20 second stands with CGA in the parallel bars. Patient instructed to bend and extend knees while holding on to parallel bars. Patient also completed 1 x 20 seconds of low marching in the parallel bars with a 4# ankle weight around each ankle to increase challenge and proprioception. Patient with significant fatigue throughout weightbearing activity, asking to sit promptly after nearly 20 seconds had passed. Additionally, upon each stand, patient with genu recurvatum that tilts WC backwards, patient unable to correct throughout trials.      Next, patient completed approximately 100 feet of WC mobility that included weaving in and out of cones. Patient unable to stay between cones, knocking down many cones throughout trials. At the completion of the session, patient taken through gentle PROM of the bilateral LE to prevent contracture due to significantly decreased weightbearing activity due to BP concerns and anxiety. All needs met at the completion of the session.       Education Materials engineer, taught by Marquis Buggy at 01/14/2022  4:40 PM.  Learner: Patient  Readiness: Acceptance  Method: Explanation  Response: Verbalizes Understanding    Safety issues and interventions, taught by Marquis Buggy at 01/14/2022  4:40 PM.  Learner: Patient  Readiness: Acceptance  Method: Explanation  Response: Verbalizes Understanding    Rehab techniques/procedure, taught by Marquis Buggy at 01/14/2022  4:40 PM.  Learner: Patient  Readiness: Acceptance  Method: Explanation  Response: Verbalizes Understanding    Precautions, taught by Marquis Buggy at 01/14/2022  4:40 PM.  Learner: Patient  Readiness: Acceptance  Method: Explanation  Response: Verbalizes Understanding    Plan of care, taught by Marquis Buggy at 01/14/2022  4:40 PM.  Learner: Patient  Readiness: Acceptance  Method: Explanation  Response: Verbalizes Understanding    Pain management, taught by Marquis Buggy at 01/14/2022  4:40 PM.  Learner: Patient  Readiness: Acceptance  Method: Explanation  Response: Verbalizes Understanding    Home exercise program, taught by Marquis Buggy at 01/14/2022  4:40 PM.  Learner: Patient  Readiness: Acceptance  Method:  Explanation  Response: Verbalizes Understanding    Functional transfers/mobility, taught by Marquis Buggy at 01/14/2022  4:40 PM.  Learner: Patient  Readiness: Acceptance  Method: Explanation  Response: [name] Understanding    Fall prevention/balance training, taught by Marquis Buggy at 01/14/2022  4:40  PM.  Learner: Patient  Readiness: Acceptance  Method: Explanation  Response: Verbalizes Understanding    Equipment, taught by Marquis Buggy at 01/14/2022  4:40 PM.  Learner: Patient  Readiness: Acceptance  Method: Explanation  Response: Verbalizes Understanding    Cognitive function, taught by Marquis Buggy at 01/14/2022  4:40 PM.  Learner: Patient  Readiness: Acceptance  Method: Explanation  Response: Verbalizes Understanding    Bowel and bladder management, taught by Marquis Buggy at 01/14/2022  4:40 PM.  Learner: Patient  Readiness: Acceptance  Method: Explanation  Response: Verbalizes Understanding    ADL retraining, taught by Marquis Buggy at 01/14/2022  4:40 PM.  Learner: Patient  Readiness: Acceptance  Method: Explanation  Response: Verbalizes Understanding    Education Comments  No comments found.         Assessment & Plan   Patient with significant fatigue throughout brief (20 second) weightbearing trials and WC mobility today. However, BP remained WNL. Plan to continue building weightbearing tolerance and endurance as tolerance allows. Patient to benefit from skilled PT to address these deficits and decrease BOC upon [place] home.     Plan:  Risks/Benefits/POC Discussed with Pt/Family: With patient  Treatment/Interventions: Gait training, Exercise, Stair training, Neuromuscular re-education, Functional transfer training, LE strengthening/ROM, Endurance training, Patient/family training, Equipment eval/education, Bed mobility, Compensatory technique education, Continued evaluation    Recommendation:  Discharge Recommendation: Home with home health PT, SNF (will continue to assess)  PT Therapy Recommendation: 5-6 days/week, 60-120 mins/day, 1:1 treatment  PT Estimated Length of Stay: 10-14 days from IE on 01/04/2022

## 2022-01-14 NOTE — OT Progress Note (Addendum)
Occupational Therapy  Inpatient Rehabilitation Daily Progress Note    Patient Name:  [name]       Medical Record Number: [id number]   Date of Birth: Dec 31, 1955  Sex: Female          Room/Bed:  M501/M501-02    Therapy Received:  Start Time: 1340   Stop Time: 1400  Total Therapy Minutes: 20    Rehabilitation Precautions/Restrictions:  Weight Bearing Status: no restrictions  Precaution Instructions Given to Patient: Yes  Communication Precautions: Other (comment) (encourage patient to speak loudly and slowly)  Other Precautions: falls, decreased LE strength/sensation, orthostatic hypotension, ABD binder, TED hose and ACE wraps as needed    Rehab Diagnosis: 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 Upon entering room pt asleep in bed though easily aroused and agreeable to OT session.     Pt came from supine to sitting EOB with CS. CGA provided for SQPT from EOB to w/c. BP noted to drop after transition to w/c. Pt unable to recall techniques/recommendations for increasing BP without cues. Pt completed 10 reps b/l seated leg extensions, marches, and ankle pups with good results. Pt seated in w/c with needs in reach at end of session. Handoff to RN.         Education Documentation  Safety issues and interventions, taught by Encarnacion Chu, OT at 01/14/2022  1:40 PM.  Learner: Patient  Readiness: Acceptance  Method: Explanation, Teach Back  Response: Verbalizes  Understanding    Precautions, taught by Encarnacion Chu, OT at 01/14/2022  1:40 PM.  Learner: Patient  Readiness: Acceptance  Method: Explanation, Teach Back  Response: Verbalizes Understanding    Functional transfers/mobility, taught by Encarnacion Chu, OT at 01/14/2022  1:40 PM.  Learner: Patient  Readiness: Acceptance  Method: Explanation, Teach Back  Response: Verbalizes Understanding    Education Comments  No comments found.        Assessment & Plan   Pt tolerated missed min make up session well. Continue OT POC to further improve strength, endurance, and BP management education in prep for safe d/c     Plan:  Risks/Benefits/POC Discussed with Pt/Family: With patient  Treatment Interventions: ADL retraining, Functional transfer training, UE strengthening/ROM, Endurance training, Cognitive reorientation, Patient/Family training, Equipment eval/education, Neuro muscular reeducation, Fine motor coordination activities, Compensatory technique education    Recommendation:  Discharge Recommendation: Home with home health OT, Home with home health PT  OT Therapy Recommendation: 5-6 days/week, 60-120 mins/day, 1:1 treatment, Group therapy  OT Estimated Length of Stay: 14 days

## 2022-01-14 NOTE — OT Progress Note (Signed)
Occupational Therapy  Inpatient Rehabilitation Daily Progress Note    Patient Name:  Sareen Randon       Medical Record Number: [id number]   Date of Birth: 17-Nov-1955  Sex: Female          Room/Bed:  M501/M501-02    Therapy Received:  Start Time: 0900   Stop Time: 1000  Total Therapy Minutes: 60    Rehabilitation Precautions/Restrictions:  Weight Bearing Status: no restrictions  Precaution Instructions Given to Patient: Yes  Communication Precautions: Other (comment) (encourage patient to speak loudly and slowly)  Other Precautions: falls, decreased LE strength/sensation, orthostatic hypotension, ABD binder, TED hose and ACE wraps as needed    Rehab Diagnosis: 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 I know I need to eat more."  Patient/Caregiver Goals: I want to be independent  Pain Assessment:  Pain Assessment: No/denies pain    Objective   Vitals:   01/14/22 1016 01/14/22 1022 01/14/22 1027   Vitals   Heart Rate (!) 109 76 (!) 112   BP 108/71 93/65 (!) 88/50   BP Location Left arm Left arm Left arm   MAP (mmHg) 84 74 (!) 63   Patient Position Sitting Sitting Sitting      01/14/22 1034 01/14/22 1039   Vitals   Heart Rate (!) 114 (!) 111   BP (!) 89/59 104/71   BP Location Right arm Right arm   MAP (mmHg) 69 82   Patient Position Sitting Sitting     Interventions: Patient greeted seated at nurses station +BLE TED hose and abdominal binder, eager for therapy session with notable improved mood. Noted somewhat low BP, therefore donned BLE ACE wraps in prep for standing activity.    With prompting, patient able to identify strategies to combat against OH including drinking water, BLE exercises, and lying down.     Session focused on [facility] management, STS t/f, standing tolerance, and standing balance with slow, incremental increase in demands of stands. She completed 5 STS total with CGA to stand and mod A to limit retropulsion:  1 Standing march on BLE  2 Standing  march on BLE  3 Standing march on BLE  4 Standing march on BLE  5 standing with focus on limiting retropulsion (able to progress from mod A to CGA x2)    Patient practiced completed SPT from wheelchair>EOB using RW with mod A for slow, controlled descent.     Education Documentation  Safety issues and interventions, taught by Davonna Belling, OT at 01/14/2022 12:52 PM.  Learner: Patient  Readiness: Acceptance  Method: Explanation, Demonstration  Response: Verbalizes Understanding, Demonstrated Understanding, Needs Reinforcement    Rehab techniques/procedure, taught by Davonna Belling, OT at 01/14/2022 12:52 PM.  Learner: Patient  Readiness: Acceptance  Method: Explanation, Demonstration  Response: Verbalizes Understanding, Demonstrated Understanding, Needs Reinforcement    Plan of care, taught by Davonna Belling, OT at 01/14/2022 12:52 PM.  Learner: Patient  Readiness: Acceptance  Method: Explanation, Demonstration  Response: Verbalizes Understanding, Demonstrated Understanding, Needs Reinforcement    Functional transfers/mobility, taught by Davonna Belling, OT at 01/14/2022 12:52 PM.  Learner: Patient  Readiness: Acceptance  Method: Explanation, Demonstration  Response: Verbalizes Understanding, Demonstrated Understanding, Needs Reinforcement    Fall prevention/balance training, taught by Davonna Belling, OT at 01/14/2022 12:52 PM.  Learner: Patient  Readiness: Acceptance  Method: Explanation, Demonstration  Response: Verbalizes Understanding, Demonstrated Understanding, Needs Reinforcement    ADL retraining,  taught by Davonna BellingMazique, Woodward Klem, OT at 01/14/2022 12:52 PM.  Learner: Patient  Readiness: Acceptance  Method: Explanation, Demonstration  Response: Verbalizes Understanding, Demonstrated Understanding, Needs Reinforcement    Education Comments  No comments found.      Assessment & Plan   Patient demonstrating progress for the first time this session with some OH, but asymptomatic overall. She has significant BLE swelling  and demonstrates poor proprioception during upright tasks and poor standing tolerance. Continue OT POC with focus on above barriers.     Plan:  Risks/Benefits/POC Discussed with Pt/Family: With patient  Treatment Interventions: ADL retraining, Functional transfer training, UE strengthening/ROM, Endurance training, Cognitive reorientation, Patient/Family training, Equipment eval/education, Neuro muscular reeducation, Fine motor coordination activities, Compensatory technique education    Recommendation:  Discharge Recommendation: Home with home health OT, Home with home health PT  OT Therapy Recommendation: 5-6 days/week, 60-120 mins/day, 1:1 treatment, Group therapy  OT Estimated Length of Stay: 14 days

## 2022-01-14 NOTE — Plan of Care (Signed)
Problem: Cardiac Function  Goal: LTG: Pt will be independent with BP and pulse management and medication at [place]  Outcome: Progressing   Teaching provided on BP monitoring and medication side effects with verbalized understanding.  Problem: Pain Management  Goal: LTG: Pt will achieve good pain control with current medication regimen as evidenced by ability to participate in 100% of therapy sessions.  Outcome: Progressing   Dilaudid given for lower back pain and effective.  Problem: Safety Risk  Goal: LTG: Pt will recognize limitations and use call bell 100% of the time prior to transfers in order to prevent fsalls  Outcome: Progressing   Pt has good safety awareness and follows all fall precautions.

## 2022-01-14 NOTE — Progress Notes (Signed)
PHYSICAL MEDICINE AND REHABILITATION  PROGRESS NOTE  FACE TO Bruce Donath    Date Time: 01/14/22 5:19 PM    Patient Name: [name],[name]  Admission date:  01/03/2022  (LOS: 11 days)    Subjective:     Patient has no new complaints. Denies pain. Tolerating therapies.     Functional Status:     Patient with significant fatigue throughout brief (20 second) weightbearing trials and WC mobility today. However, BP remained WNL. Plan to continue building weightbearing tolerance and endurance as tolerance allows. Patient to benefit from skilled PT to address these deficits and decrease BOC upon [place] home.     Medications:   Medication reviewed by me:     Scheduled Meds: PRN Meds:    apixaban, 5 mg, Oral, Q12H SCH  budesonide-formoterol, 2 puff, Inhalation, BID  ferrous sulfate, 324 mg, Oral, QAM W/BREAKFAST  lidocaine, 1 patch, Transdermal, Q24H  lurasidone, 120 mg, Oral, QPM  metoprolol succinate, 125 mg, Oral, QHS  midodrine, 5 mg, Oral, BID Meals  pantoprazole, 40 mg, Oral, QAM AC  QUEtiapine, 100 mg, Oral, QHS  rOPINIRole, 1 mg, Oral, QHS  senna-docusate, 2 tablet, Oral, QHS  tamsulosin, 0.4 mg, Oral, Daily after dinner  thiamine, 100 mg, Oral, Daily  vitamin B-6, 50 mg, Oral, Daily  vitamin D, 25 mcg, Oral, Daily  vitamins/minerals, 1 tablet, Oral, Daily at 1700  zinc sulfate, 220 mg, Oral, Daily at 1700        Continuous Infusions:   acetaminophen, 650 mg, Q4H PRN  albuterol-ipratropium, 3 mL, Q6H PRN  bisacodyl, 10 mg, Daily PRN  bisacodyl, 10 mg, Daily PRN  diphenhydrAMINE, 25 mg, Q6H PRN  gabapentin, 100 mg, TID PRN  HYDROmorphone, 2 mg, Q6H PRN  lactulose, 20 g, Q4H PRN  LORazepam, 0.5 mg, QHS PRN  melatonin, 3 mg, QHS PRN  promethazine, 25 mg, Q6H PRN          Medication Review  A complete drug regimen review was completed: Yes  Were drug issues were found during review: No   If yes to drug issues found during review:  What was the issue?:   What was the time the issue was identified?:   Was I contacted and action  was taken by midnight of the next calendar day once issue was identified?: N/A   Person who contacted me:   Action taken:     Review of Systems:   A comprehensive review of systems was: No fevers, chills, nausea, vomiting, chest pain, shortness of breath, cough, headache, double vision.  All others negative.    Labs:     Recent Labs   Lab 01/13/22  0436 01/12/22  1759 01/10/22  1519   WBC 8.81 10.14* 9.30   Hgb 8.9* 10.0* 9.9*   Hematocrit 27.8* 31.6* 30.8*   Platelets 433* 333 369*      Recent Labs   Lab 01/13/22  0436 01/12/22  1759 01/10/22  1519   Sodium 133* 130* 129*   Potassium 4.4 5.3 4.9   Chloride 106 106 104   CO2 16* 12* 14*   BUN 8.0 8.0 8.0   Creatinine 0.7 0.8 0.7   Calcium 9.2 9.5 9.5   Glucose 92 117* 88         Rads:   Radiological Procedure reviewed.  Radiology Results (24 Hour)       ** No results found for the last 24 hours. **          Physical Exam:  Vitals:    01/14/22 1412 01/14/22 1431 01/14/22 1453 01/14/22 1625   BP: 97/55 92/50 99/61  106/69   Pulse: (!) 109 (!) 111 (!) 108 (!) 104   Resp:    16   Temp:    97.5 F (36.4 C)   TempSrc:    Oral   SpO2:    100%   Weight:       Height:           Intake and Output Summary (Last 24 hours) at Date Time    Intake/Output Summary (Last 24 hours) at 01/14/2022 1719  Last data filed at 01/14/2022 1233  Gross per 24 hour   Intake 1300 ml   Output 1500 ml   Net -200 ml     P.O.: 360 mL (01/14/22 1233)     Urine: 0 mL (01/14/22 0718)             Gen: Alert, NAD  Psych: Pleasant and cooperative  Skin: Pink, warm, no rashes  HEENT: Atraumatic, normocephalic,   Chest: symmetric bilateral chest rise and fall,   CV: Regular rate and rhythm, no murmurs  Resp: breath sounds equal throughout; ctax2  Abd: non-distended, +BS  Extr: Pink, warm,  Neuro:  Moving all ext    Assessment and Plan:     [name] is a 66 y.o. female with myositis    #Rehab  Multidisciplinary rehab program  Physical Therapy focused on improving strength, endurance, balance, functional  mobility and transfers for 60-120 mins/day, 5-6 days/week, for 14 days.   Occupational Therapy focused on improving strength, endurance, transfers, activities of daily living, and cognition for 60-120 mins/day, 5-6 days/week, for 14 days.     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%   Metoprolol,   losartan held due to hypotension     # COPD  Symbicort      # bipolar disorder  Latuda   Seroquel     # Pain Management   Daily monitoring   gabapentin  Acetaminophen, dilaudid PRN     #Bowel Management  constipation - Senna, Colace, Bisacodyl suppository PRN; hold for loose stools    #Bladder Management  Check PVRs    #Skin Management  Frequent skin assessment  Moisture barrier PRN    #FEN/GI with severe malnutrition   cardiac diet    #DVT PPx  eliquis    #Dispo  Pending rehab progress      Continue comprehensive and intensive inpatient rehab program, including:   Physical therapy 60-120 min daily, 5-6 times per week, Occupational therapy 60-120 min daily, 5-6 times per week, Case management and Rehabilitation nursing.  Psychology services to evaluate and treat as needed     Signed by: Cheryll Dessert, MD   01/14/2022, 5:19 PM    [facility]

## 2022-01-15 LAB — BASIC METABOLIC PANEL
Anion Gap: 10 (ref 5.0–15.0)
BUN: 7 mg/dL (ref 7.0–21.0)
CO2: 18 mEq/L (ref 17–29)
Calcium: 9.2 mg/dL (ref 8.5–10.5)
Chloride: 106 mEq/L (ref 99–111)
Creatinine: 0.7 mg/dL (ref 0.4–1.0)
Glucose: 99 mg/dL (ref 70–100)
Potassium: 5 mEq/L (ref 3.5–5.3)
Sodium: 134 mEq/L — ABNORMAL LOW (ref 135–145)

## 2022-01-15 LAB — GFR: EGFR: >60

## 2022-01-15 NOTE — Plan of Care (Signed)
Problem: Pain Management  Goal: LTG: Pt will achieve good pain control with current medication regimen as evidenced by ability to participate in 100% of therapy sessions.  Outcome: Progressing   Back pain well controlled with lidocaine patch and frequent repositioning.  Problem: Safety Risk  Goal: LTG: Pt will recognize limitations and use call bell 100% of the time prior to transfers in order to prevent fsalls  Outcome: Progressing   Calls appropriately for assistance with transfers and follows all fall precautions

## 2022-01-15 NOTE — Progress Notes (Signed)
PROGRESS NOTE -- FACE-TO-FACE ENCOUNTER  PHYSICAL MEDICINE AND REHABILITATION      Date Time: 01/15/22 11:11 AM  Patient Name: [name],[name]    Admission date:  01/03/2022      Plan:   Continue PT, OT in acute rehabilitation medical setting.  Continue comprehensive and intensive inpatient rehab program, including:   Physical therapy 60-120 min daily, 5-6 times per week, Occupational therapy 60-120 min daily, 5-6 times per week, Case management and Rehabilitation nursing      Assessment:   Continue with acute inpatient rehabilitation with close medical supervision for Myositis.     Patient Active Problem List   Diagnosis    CVA (cerebral vascular accident)    Chest pain    Chest pain with high risk of acute coronary syndrome    Paroxysmal atrial fibrillation    Hypertension    Hyperlipidemia    Seizure disorder    History of gastrointestinal hemorrhage    Depression    Anemia    Asthma    Non-traumatic subcutaneous emphysema    Chest pain with moderate risk for cardiac etiology    Left-sided weakness    History of asthma    History of transient ischemic attack (TIA)    Type 2 diabetes mellitus    Syncope and collapse    Weakness of both legs    Hypokalemia    Thoracic back pain    Gastroparesis    Guillain Barr syndrome    Myositis       Subjective:   No new complaints.  No new concerns with pain or sleep.    Medications:   Medications reviewed by me:     Scheduled Meds: PRN Meds:    apixaban, 5 mg, Oral, Q12H SCH  budesonide-formoterol, 2 puff, Inhalation, BID  ferrous sulfate, 324 mg, Oral, QAM W/BREAKFAST  lidocaine, 1 patch, Transdermal, Q24H  lurasidone, 120 mg, Oral, QPM  metoprolol succinate, 125 mg, Oral, QHS  midodrine, 5 mg, Oral, BID Meals  pantoprazole, 40 mg, Oral, QAM AC  QUEtiapine, 100 mg, Oral, QHS  rOPINIRole, 1 mg, Oral, QHS  senna-docusate, 2 tablet, Oral, QHS  tamsulosin, 0.4 mg, Oral, Daily after dinner  thiamine, 100 mg, Oral, Daily  vitamin B-6, 50 mg, Oral, Daily  vitamin D, 25 mcg, Oral,  Daily  vitamins/minerals, 1 tablet, Oral, Daily at 1700  zinc sulfate, 220 mg, Oral, Daily at 1700        Continuous Infusions:   acetaminophen, 650 mg, Q4H PRN  albuterol-ipratropium, 3 mL, Q6H PRN  bisacodyl, 10 mg, Daily PRN  bisacodyl, 10 mg, Daily PRN  diphenhydrAMINE, 25 mg, Q6H PRN  gabapentin, 100 mg, TID PRN  HYDROmorphone, 2 mg, Q6H PRN  lactulose, 20 g, Q4H PRN  LORazepam, 0.5 mg, QHS PRN  melatonin, 3 mg, QHS PRN  promethazine, 25 mg, Q6H PRN          Medication Review  A complete drug regimen review was completed: Yes  Were any drug issues found during review?: No     Review of Systems:   No new issues overnight.  No new pain.    Physical Exam:     Vitals:    01/14/22 1625 01/14/22 2128 01/15/22 0525 01/15/22 0838   BP: 106/69 108/65 105/67 99/51   Pulse: (!) 104 (!) 104 98    Resp: 16      Temp: 97.5 F (36.4 C) 98.1 F (36.7 C) 98.1 F (36.7 C)    TempSrc: Oral Oral Oral  SpO2: 100% 98% 100%    Weight:       Height:           Intake and Output Summary (Last 24 hours) at Date Time    Intake/Output Summary (Last 24 hours) at 01/15/2022 1111  Last data filed at 01/15/2022 2440  Gross per 24 hour   Intake 800 ml   Output 1350 ml   Net -550 ml     P.O.: 200 mL (01/15/22 0545)     Urine: 0 mL (01/15/22 1027)             Chest:  symmetric movement of chest  NAD.    Labs:   No results for input(s): GLUCOSEWHOLE in the last 24 hours.    Recent Labs   Lab 01/13/22  0436 01/12/22  1759 01/10/22  1519   WBC 8.81 10.14* 9.30   Hgb 8.9* 10.0* 9.9*   Hematocrit 27.8* 31.6* 30.8*   Platelets 433* 333 369*        Recent Labs   Lab 01/15/22  0531 01/13/22  0436 01/12/22  1759 01/10/22  1519   Sodium 134* 133* 130* 129*   Potassium 5.0 4.4 5.3 4.9   Chloride 106 106 106 104   CO2 18 16* 12* 14*   BUN 7.0 8.0 8.0 8.0   Creatinine 0.7 0.7 0.8 0.7   Calcium 9.2 9.2 9.5 9.5   Glucose 99 92 117* 88             Results       Procedure Component Value Units Date/Time    GFR [[id number]] Collected: 01/15/22 0531     Updated:  01/15/22 0632     EGFR >60.0    Basic Metabolic Panel [[id number]]  (Abnormal) Collected: 01/15/22 0531    Specimen: Blood Updated: 01/15/22 0632     Glucose 99 mg/dL      BUN 7.0 mg/dL      Creatinine 0.7 mg/dL      Calcium 9.2 mg/dL      Sodium 875 mEq/L      Potassium 5.0 mEq/L      Chloride 106 mEq/L      CO2 18 mEq/L      Anion Gap 10.0               Rads:   Radiological Procedure reviewed.  Radiology Results (24 Hour)       ** No results found for the last 24 hours. **            Signed by: Virl Cagey, MD    [facility]      If there are questions or concerns about the content of this note or information contained within the body of this dictation they should be addressed directly with the author for clarification.

## 2022-01-15 NOTE — Progress Notes (Signed)
MEDICINE PROGRESS NOTE  [facility], DIVISION OF HOSPITALIST MEDICINE   [facility]   [place] Pager: [phone]      Date Time: 01/15/22 2:50 PM  Patient Name: [name]  Attending Physician: Anastasia Pall, 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 Patient was discharged on 2/17 and went back to the hospital on 2/18 for continued management of rhabdomyolysis.  Patient noted to have left-sided weakness with associated difficulty with walking, standing, also developing bilateral hand and feet paresthesias and hand weakness,she was seen by neurology, work-up including MRIs, lumbar puncture done with no clear etiology,was transferred to [facility] for consideration for IVIG.  While at [facility], first dose of IVIG was started,but apparently,she developed allergic reaction therefore IVIG was discontinued. Diagnosis at the time of discharge stayed unclear,DDx was possible statin induced myositis vs. inflammatory myositis vs. immune mediated myositis.  She was also noted to have multiple vitamin deficiencies including D, zinc, B6, iron deficiency which has been replaced.  Patient was discharged to acute rehab on 3/14.    Recommendations:   Myositis: Unclear etiology including possible statin induced myositis versus inflammatory arthritis versus immediate myositis  -Attempted IVIG at [facility] unsuccessful due to development of allergic reaction  -Patient's access was removed - as the initial plan to plasma exchange was cancelled. 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 role of biopsy?      Hyponatremia  -Na 133-->134 as of 4/23  -Fluid restrict 1000 cc  -check BMP on Monday  -urine Na <20 and urine osmolality  75, both low indicating hypovolemia cause of hyponatremia      metabolic acidosis,resolved  -HCO3 was 12 and now 16-->18  -no s/o infection  -check BMP on Monday      Vitamin D, B6, B1, zinc deficiencies  -Continue multivitamin supplement  -Continue thiamine(received IV replacement 3/4-3/9), vitamin B6, zinc supplements       Normocytic anemia  -Noted TSAT of 21%, ferritin 492, normal folate level on 3/10; -normal vitamin B12 level 3/4  -cont ferrous sulfate  -Hgb has been stable       Moderate protein calorie malnutrition  -Weight loss, was on Ozempic but discontinued  -c/w glucerna and regular diet      Paroxysmal A-fib--currently in sinus  -cont metoprolol XL 125 mg in evening (increased from 100 mg on 3/17)  -Continue Eliquis  -Follow heart rate      Orthostatic hypotension  -Responding to midodrine 5 mg in am and afternoon for now (continuing metoprolol with tachycardia and PAF)  -Noted today's blood pressure,may need to increase midodrine, c/w compression stocking and abd binder  -she is on Flomax and need to monitor closely      Urinary retension  -Currently has a Foley catheter, failed voiding trial on 3/13   -has been restarted on Flomax in anticipation of voiding trial early next week.       COPD--Not in exacerbation   -continue Symbicort  -prn duonebs  Pt had c/o shortness of breath on  3/21-resolved  -no hypoxia noted/no fever  -Chest x-ray was done 3/21-no acute changes  -Patient subsequently felt better  -has been using her inhaler, no wheezing noted      History of nonischemic cardiopathy  -Echo 12/31/2021 with EF 43%, mild global hypokinesis  -Noted per cardiology note  3/13 with no further plans for ischemic work-up as patient had cardiac catheterization in 2022 with no obstructive CAD--outpatient follow-up for nonischemic cardiomyopathy work-up  -Repeat echo as outpatient     Bipolar disorder  -Continue home meds including Seroquel, ropinirole; home latuda 120mg  daily(pt has home med)     Diabetes--Hemoglobin A1C 5.3% on 3/6  -Patient was on Ozempic but stopped due to malnutrition/weight loss  -No need for sliding-scale     Case was discussed with patient     Subjective/ROS/24hr events:     CC: Myositis    Patient with no complaints of dizziness/lightheadedness,  Patient denies any chest pain or shortness of breath.  No cough,afebrile,+normal BM      Physical Exam:     Temp:  [97.5 F (36.4 C)-98.1 F (36.7 C)] 98.1 F (36.7 C)  Heart Rate:  [98-108] 98  Resp Rate:  [16] 16  BP: (99-108)/(51-69) 99/51    Intake/Output Summary (Last 24 hours) at 01/15/2022 1450  Last data filed at 01/15/2022 1610  Gross per 24 hour   Intake 640 ml   Output 1350 ml   Net -710 ml    General: alert and awake, no distress  HEENT: anicteric, moist mucous membranes  Neck: supple, no JVD  CVS: nl S1, S2; no gallop or rub  Lungs: clear to auscultation b/l, no wheezing or rhonchi; normal resp effort  Abd: soft, non-tender, non-distended  Ext: no pedal edema, no cyanosis  Neuro: alert and awake, moving all extremities       Meds:   Medications were reviewed:  Scheduled Meds:  Current Facility-Administered Medications   Medication Dose Route Frequency    apixaban  5 mg Oral Q12H SCH    budesonide-formoterol  2 puff Inhalation BID    ferrous sulfate  324 mg Oral QAM W/BREAKFAST    lidocaine  1 patch Transdermal Q24H    lurasidone  120 mg Oral QPM    metoprolol succinate  125 mg Oral QHS    midodrine  5 mg Oral BID Meals    pantoprazole  40 mg Oral QAM AC    QUEtiapine  100 mg Oral QHS    rOPINIRole  1 mg Oral QHS    senna-docusate  2 tablet Oral QHS    tamsulosin  0.4 mg Oral Daily after dinner    thiamine   100 mg Oral Daily    vitamin B-6  50 mg Oral Daily    vitamin D  25 mcg Oral Daily    vitamins/minerals  1 tablet Oral Daily at 1700    zinc sulfate  220 mg Oral Daily at 1700     Continuous Infusions:  PRN Meds:.acetaminophen, albuterol-ipratropium, bisacodyl, bisacodyl, diphenhydrAMINE, gabapentin, HYDROmorphone, lactulose, LORazepam, melatonin, promethazine  Labs/Radiology:   Imaging personally reviewed, including: all available   No results found.  No results for input(s): GLUCOSEWB in the last 24 hours.  Recent Labs   Lab 01/15/22  0531 01/13/22  0436 01/12/22  1759 01/10/22  1519   Sodium 134* 133* 130* 129*   Potassium 5.0 4.4 5.3 4.9   Chloride 106 106 106 104   BUN 7.0 8.0 8.0 8.0   Creatinine 0.7 0.7  0.8 0.7   EGFR >60.0 >60.0 >60.0 >60.0   Glucose 99 92 117* 88   Calcium 9.2 9.2 9.5 9.5     Recent Labs   Lab 01/13/22  0436 01/12/22  1759 01/10/22  1519   WBC 8.81 10.14* 9.30   Hgb 8.9* 10.0* 9.9*   Hematocrit 27.8* 31.6* 30.8*   Platelets 433* 333 369*         Signed by: Richarda Osmond, MD,FACP

## 2022-01-15 NOTE — Plan of Care (Signed)
Problem: Bladder Management  Goal: LTG: Pt will be 100% continent and independent with bladder elim at [place]  Outcome: Progressing     Problem: Cardiac Function  Goal: LTG: Pt will be independent with BP and pulse management and medication at [place]  Outcome: Progressing     Problem: Pain Management  Goal: LTG: Pt will achieve good pain control with current medication regimen as evidenced by ability to participate in 100% of therapy sessions.  Outcome: Progressing     Problem: Safety Risk  Goal: LTG: Pt will recognize limitations and use call bell 100% of the time prior to transfers in order to prevent fsalls  Outcome: Progressing

## 2022-01-16 DIAGNOSIS — K5900 Constipation, unspecified: Secondary | ICD-10-CM

## 2022-01-16 MED ORDER — POLYETHYLENE GLYCOL 3350 17 G PO PACK
17.0000 g | PACK | Freq: Every day | ORAL | Status: DC
Start: 2022-01-16 — End: 2022-01-26
  Administered 2022-01-16 – 2022-01-26 (×11): 17 g via ORAL
  Filled 2022-01-16 (×11): qty 1

## 2022-01-16 NOTE — Plan of Care (Signed)
Problem: Bladder Management  Goal: LTG: Pt will be 100% continent and independent with bladder elim at [place]  Outcome: Not Progressing  pt is still with the  foleycatheter

## 2022-01-16 NOTE — Plan of Care (Signed)
Problem: Cardiac Function  Goal: LTG: Pt will be independent with BP and pulse management and medication at [place]  Outcome: Progressing pt on telemetry monitor to follow her A-fib, so far no new change seen from the previous time

## 2022-01-16 NOTE — Progress Notes (Signed)
PROGRESS NOTE -- FACE-TO-FACE ENCOUNTER  PHYSICAL MEDICINE AND REHABILITATION      Date Time: 01/16/22 1:40 PM  Patient Name: [name]    Admission date:  01/03/2022    Plan:     Continue PT, OT in acute rehabilitation medical setting.  Continue comprehensive and intensive inpatient rehab program, including:   Physical therapy 60-120 min daily, 5-6 times per week, Occupational therapy 60-120 min daily, 5-6 times per week, Case management and Rehabilitation nursing    No new issues overnight.    Assessment:     Medically stable to continue with acute inpatient rehab for Myositis.    Patient Active Problem List   Diagnosis    CVA (cerebral vascular accident)    Chest pain    Chest pain with high risk of acute coronary syndrome    Paroxysmal atrial fibrillation    Hypertension    Hyperlipidemia    Seizure disorder    History of gastrointestinal hemorrhage    Depression    Anemia    Asthma    Non-traumatic subcutaneous emphysema    Chest pain with moderate risk for cardiac etiology    Left-sided weakness    History of asthma    History of transient ischemic attack (TIA)    Type 2 diabetes mellitus    Syncope and collapse    Weakness of both legs    Hypokalemia    Thoracic back pain    Gastroparesis    Guillain Barr syndrome    Myositis       Subjective:   No new pain.    Medications:   Medication reviewed by me:     Scheduled Meds: PRN Meds:    apixaban, 5 mg, Oral, Q12H SCH  budesonide-formoterol, 2 puff, Inhalation, BID  ferrous sulfate, 324 mg, Oral, QAM W/BREAKFAST  lidocaine, 1 patch, Transdermal, Q24H  lurasidone, 120 mg, Oral, QPM  metoprolol succinate, 125 mg, Oral, QHS  midodrine, 5 mg, Oral, BID Meals  pantoprazole, 40 mg, Oral, QAM AC  polyethylene glycol, 17 g, Oral, Daily  QUEtiapine, 100 mg, Oral, QHS  rOPINIRole, 1 mg, Oral, QHS  senna-docusate, 2 tablet, Oral, QHS  tamsulosin, 0.4 mg, Oral, Daily after dinner  thiamine, 100 mg, Oral, Daily  vitamin B-6, 50 mg, Oral, Daily  vitamin D, 25 mcg, Oral,  Daily  vitamins/minerals, 1 tablet, Oral, Daily at 1700  zinc sulfate, 220 mg, Oral, Daily at 1700        Continuous Infusions:   acetaminophen, 650 mg, Q4H PRN  albuterol-ipratropium, 3 mL, Q6H PRN  bisacodyl, 10 mg, Daily PRN  bisacodyl, 10 mg, Daily PRN  diphenhydrAMINE, 25 mg, Q6H PRN  gabapentin, 100 mg, TID PRN  HYDROmorphone, 2 mg, Q6H PRN  lactulose, 20 g, Q4H PRN  LORazepam, 0.5 mg, QHS PRN  melatonin, 3 mg, QHS PRN  promethazine, 25 mg, Q6H PRN          Medication Review  A complete drug regimen review was completed: Yes  Were any drug issues found during review?: No     Review of Systems:   No new problems with pain or sleep.    Physical Exam:     Vitals:    01/15/22 1616 01/15/22 1927 01/15/22 2106 01/16/22 0532   BP: 107/72  105/63 99/62   Pulse: 79  70 99   Resp: 18      Temp: 98.8 F (37.1 C)   99.7 F (37.6 C)   TempSrc: Oral   Oral  SpO2: 95% 95%  99%   Weight:    58.6 kg (129 lb 3 oz)   Height:           Intake and Output Summary (Last 24 hours) at Date Time    Intake/Output Summary (Last 24 hours) at 01/16/2022 1340  Last data filed at 01/16/2022 1000  Gross per 24 hour   Intake 440 ml   Output 550 ml   Net -110 ml     P.O.: 240 mL (01/16/22 0900)     Urine: 0 mL (01/16/22 1000)             NAD  Chest:  symmetric movement of chest    Labs:   No results for input(s): GLUCOSEWHOLE in the last 24 hours.    Recent Labs   Lab 01/13/22  0436 01/12/22  1759 01/10/22  1519   WBC 8.81 10.14* 9.30   Hgb 8.9* 10.0* 9.9*   Hematocrit 27.8* 31.6* 30.8*   Platelets 433* 333 369*        Recent Labs   Lab 01/15/22  0531 01/13/22  0436 01/12/22  1759 01/10/22  1519   Sodium 134* 133* 130* 129*   Potassium 5.0 4.4 5.3 4.9   Chloride 106 106 106 104   CO2 18 16* 12* 14*   BUN 7.0 8.0 8.0 8.0   Creatinine 0.7 0.7 0.8 0.7   Calcium 9.2 9.2 9.5 9.5   Glucose 99 92 117* 88                       Results       ** No results found for the last 24 hours. **            Rads:   Radiological Procedure reviewed.  Radiology  Results (24 Hour)       ** No results found for the last 24 hours. **            Signed by: Virl Cagey, MD     [facility]    If there are questions or concerns about the content of this note or information contained within the body of this dictation they should be addressed directly with the author for clarification.

## 2022-01-16 NOTE — Plan of Care (Signed)
Problem: Bladder Management  Goal: LTG: Pt will be 100% continent and independent with bladder elim at [place]  Outcome: Progressing:   Pt is still with indwelling foley cathter, foley care is done with [facility]     Problem: Cardiac Function  Goal: LTG: Pt will be independent with BP and pulse management and medication at [place]  Outcome: Progressing   Pt is on telemetry monitor due to A.FIB. vital signs are WNR   Problem: Pain Management  Goal: LTG: Pt will achieve good pain control with current medication regimen as evidenced by ability to participate in 100% of therapy sessions.  Outcome: Progressing:  Pt c/o pain in the lower back, PRN dilaudid 2mg  is given per order      Problem: Safety Risk  Goal: LTG: Pt will recognize limitations and use call bell 100% of the time prior to transfers in order to prevent fsalls  Outcome: Progressing:  Pt able to call when in need, call bell is with in reach and bed alarm and chair alarms are engaged.

## 2022-01-16 NOTE — Plan of Care (Signed)
Problem: Pain Management  Goal: LTG: Pt will achieve good pain control with current medication regimen as evidenced by ability to participate in 100% of therapy sessions.  Outcome: Progressing pt has a controlled pain with the current scheduled pain med

## 2022-01-16 NOTE — Progress Notes (Signed)
MEDICINE PROGRESS NOTE  [facility], DIVISION OF HOSPITALIST MEDICINE   [facility]   [place] Pager: [phone]      Date Time: 01/16/22 3:31 PM  Patient Name: [name]  Attending Physician: Anastasia Pall, E[facility]on [facility]  Assessment:   66 year old female with past medical history significant for diabetes, hypertension, nonischemic cardiomyopathy, paroxysmal A-fib, anemia, bipolar, TIAs presented to [facility]  hospital on 2/7 with lower extremity weakness, paresthesias, transaminitis thought to have 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 Patient was discharged on 2/17 and went back to the hospital on 2/18 for continued management of rhabdomyolysis.  Patient noted to have left-sided weakness with associated difficulty with walking, standing, also developing bilateral hand and feet paresthesias and hand weakness,she was seen by neurology, work-up including MRIs, lumbar puncture done with no clear etiology,was transferred to [facility] for consideration for IVIG.  While at [facility], first dose of IVIG was started,but apparently,she developed allergic reaction therefore IVIG was discontinued. Diagnosis at the time of discharge stayed unclear,DDx was possible statin induced myositis vs. inflammatory myositis vs. immune mediated myositis.  She was also noted to have multiple vitamin deficiencies including D, zinc, B6, iron deficiency which are being replaced.  Patient was discharged to acute rehab on 3/14.    Recommendations:   Myositis: Unclear etiology including possible statin induced myositis versus inflammatory arthritis versus immune mediated myositis  -Attempted IVIG at [facility] unsuccessful due to development of allergic reaction  -Patient's access was removed - as the initial plan to plasma exchange was cancelled. 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 role of biopsy?      Hyponatremia  -Na 133-->134 as of 01/15/22  -Fluid restrict 1000 cc  -check BMP on Monday  -urine Na <20 and urine osmolality  75, both low indicating hypovolemia cause of hyponatremia      metabolic acidosis,resolved  -HCO3 was 12 and now 16-->18  -no s/o infection  -check BMP on Monday      Vitamin D, B6, B1, zinc deficiencies  -Continue multivitamin supplement  -Continue thiamine(received IV replacement 3/4-3/9), vitamin B6, zinc supplements       Normocytic anemia  -Noted TSAT of 21%, ferritin 492, normal folate level on 3/10;  -normal vitamin B12 level on 3/4  -cont ferrous sulfate  -Hgb has been stable       Moderate protein calorie malnutrition  -Weight loss, was on Ozempic but discontinued  -c/w glucerna and regular diet      Paroxysmal A-fib--currently in sinus  -cont metoprolol XL 125 mg in evening (increased from 100 mg on 3/17)  -Continue Eliquis  -Follow heart rate      Orthostatic hypotension  -Responding to midodrine 5 mg in am and afternoon for now (continuing metoprolol at night with tachycardia and PAF)  -Noted today's blood pressure 99/62,may need to increase midodrine, c/w compression stocking and abd binder  -she is on Flomax and need to monitor  her closely        Urinary retension  -Currently has a Foley catheter, failed voiding trial on 3/13   -has been restarted on Flomax in anticipation of voiding trial early next week.probably on wednesday       COPD--Not in exacerbation   -continue  Symbicort  -prn duonebs  -Pt had c/o shortness of breath on 3/21-resolved  -no hypoxia noted/no fever  -Chest x-ray was done 3/21-no acute changes  -Patient subsequently felt better  -has been using her inhaler, no wheezing noted      History of nonischemic cardiopathy  -Echo 12/31/2021 with EF 43%,  mild global hypokinesis  -Noted per cardiology note 3/13 with no further plans for ischemic work-up as patient had cardiac catheterization in 2022 with no obstructive CAD  -outpatient follow-up for nonischemic cardiomyopathy work-up  -Repeat echo as outpatient       Bipolar disorder  -Continue home meds including Seroquel, ropinirole; home latuda 120mg  daily(pt has home med)     Diabetes  -Hemoglobin A1C 5.3% on 3/6  -Patient was on Ozempic but stopped due to malnutrition/weight loss  -No need for sliding-scale     H/o GERD  -c/w PPI      Constipation  -no abd pain or N/V,+flatus, already on pericolace, will start on miralax      Case was discussed with patient and RN    Subjective/ROS/24hr events:     CC: Myositis    Patient with no complaints of dizziness/lightheadedness,  Patient denies any chest pain or shortness of breath.  No cough,afebrile,+constipation per pt today      Physical Exam:     Temp:  [98.8 F (37.1 C)-99.7 F (37.6 C)] 99.7 F (37.6 C)  Heart Rate:  [70-99] 99  Resp Rate:  [18] 18  BP: (99-107)/(62-72) 99/62    Intake/Output Summary (Last 24 hours) at 01/16/2022 1531  Last data filed at 01/16/2022 1000  Gross per 24 hour   Intake 440 ml   Output 550 ml   Net -110 ml    General: alert and awake, no distress  HEENT: anicteric, moist mucous membranes  Neck: supple, no JVD  CVS: nl S1, S2; no gallop or rub  Lungs: clear to auscultation b/l, no wheezing or rhonchi; normal resp effort  Abd: soft, non-tender, non-distended  Ext: no pedal edema, no cyanosis  Neuro: alert and awake, moving all extremities       Meds:   Medications were reviewed:  Scheduled Meds:  Current Facility-Administered Medications   Medication Dose Route Frequency    apixaban  5 mg Oral Q12H SCH    budesonide-formoterol  2 puff Inhalation BID    ferrous sulfate  324 mg Oral QAM W/BREAKFAST    lidocaine  1 patch Transdermal Q24H    lurasidone  120 mg Oral QPM    metoprolol succinate  125 mg Oral QHS    midodrine  5 mg Oral BID  Meals    pantoprazole  40 mg Oral QAM AC    polyethylene glycol  17 g Oral Daily    QUEtiapine  100 mg Oral QHS    rOPINIRole  1 mg Oral QHS    senna-docusate  2 tablet Oral QHS    tamsulosin  0.4 mg Oral Daily after dinner    thiamine  100 mg Oral Daily    vitamin B-6  50 mg Oral Daily    vitamin D  25 mcg Oral Daily    vitamins/minerals  1 tablet Oral Daily at 1700    zinc sulfate  220 mg Oral Daily at 1700     Continuous Infusions:  PRN Meds:.acetaminophen, albuterol-ipratropium, bisacodyl, bisacodyl, diphenhydrAMINE, gabapentin, HYDROmorphone, lactulose, LORazepam, melatonin, promethazine  Labs/Radiology:   Imaging personally reviewed, including: all available   No results found.  No  results for input(s): GLUCOSEWB in the last 24 hours.  Recent Labs   Lab 01/15/22  0531 01/13/22  0436 01/12/22  1759 01/10/22  1519   Sodium 134* 133* 130* 129*   Potassium 5.0 4.4 5.3 4.9   Chloride 106 106 106 104   BUN 7.0 8.0 8.0 8.0   Creatinine 0.7 0.7 0.8 0.7   EGFR >60.0 >60.0 >60.0 >60.0   Glucose 99 92 117* 88   Calcium 9.2 9.2 9.5 9.5     Recent Labs   Lab 01/13/22  0436 01/12/22  1759 01/10/22  1519   WBC 8.81 10.14* 9.30   Hgb 8.9* 10.0* 9.9*   Hematocrit 27.8* 31.6* 30.8*   Platelets 433* 333 369*         Signed by: Richarda Osmond, MD,FACP

## 2022-01-16 NOTE — Plan of Care (Signed)
Problem: Safety Risk  Goal: LTG: Pt will recognize limitations and use call bell 100% of the time prior to transfers in order to prevent fsalls    Pt calls for assistance as needed able to follow safety precuations

## 2022-01-17 LAB — BASIC METABOLIC PANEL
Anion Gap: 6 (ref 5.0–15.0)
BUN: 6 mg/dL — ABNORMAL LOW (ref 7.0–21.0)
CO2: 21 mEq/L (ref 17–29)
Calcium: 9 mg/dL (ref 8.5–10.5)
Chloride: 108 mEq/L (ref 99–111)
Creatinine: 0.6 mg/dL (ref 0.4–1.0)
Glucose: 103 mg/dL — ABNORMAL HIGH (ref 70–100)
Potassium: 4.2 mEq/L (ref 3.5–5.3)
Sodium: 135 mEq/L (ref 135–145)

## 2022-01-17 LAB — GFR: EGFR: >60

## 2022-01-17 LAB — CBC
Absolute NRBC: 0 10*3/uL (ref 0.00–0.00)
Hematocrit: 26.8 % — ABNORMAL LOW (ref 34.7–43.7)
Hgb: 8.6 g/dL — ABNORMAL LOW (ref 11.4–14.8)
MCH: 25.7 pg (ref 25.1–33.5)
MCHC: 32.1 g/dL (ref 31.5–35.8)
MCV: 80.2 fL (ref 78.0–96.0)
MPV: 8.6 fL — ABNORMAL LOW (ref 8.9–12.5)
Nucleated RBC: 0 /100 WBC (ref 0.0–0.0)
Platelets: 378 10*3/uL — ABNORMAL HIGH (ref 142–346)
RBC: 3.34 10*6/uL — ABNORMAL LOW (ref 3.90–5.10)
RDW: 18 % — ABNORMAL HIGH (ref 11–15)
WBC: 7.03 10*3/uL (ref 3.10–9.50)

## 2022-01-17 MED ORDER — FLUCONAZOLE 100 MG PO TABS
150.0000 mg | ORAL_TABLET | ORAL | Status: AC
Start: 2022-01-17 — End: 2022-01-17
  Administered 2022-01-17: 150 mg via ORAL
  Filled 2022-01-17: qty 2

## 2022-01-17 NOTE — OT Progress Note (Signed)
Occupational Therapy  Inpatient Rehabilitation Daily Progress Note    Patient Name:  [name]       Medical Record Number: [id number]   Date of Birth: [birth year]  Sex: Female          Room/Bed:  M501/M501-02    Therapy Received:  Start Time: 1000   Stop Time: 1100  Total Therapy Minutes: 60    Rehabilitation Precautions/Restrictions:  Weight Bearing Status: no restrictions  Precaution Instructions Given to Patient: Yes  Communication Precautions: Speak slowly  Other Precautions: falls, decreased LE strength/sensation, orthostatic hypotension, ABD binder, TED hose and ACE wraps as needed    Rehab Diagnosis: 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     Greeted in direct handoff from RN following foley management. RN reporting patients SBP in high 90s while lying supine. Donned BLE TED hose, ACE wraps, and abdominal binder in prep for OOB therapy. She completed squat pivot t/f from EOB to wheelchair with CGA.     Transported patient to therapy gym where she completed 5 STS from wheelchair<>// bars with focus on anterior leans during initial stand/descent, standing tolerance, and standing balance. She required CGA-min A for managing pelvic tilt to improve midline in frontal plane.     Patient practiced taking 10 steps forward with w/c follow x2, CGA, and VC for increasing step length.    Rest of session focused on coaching for proper body mechanics and sequencing STS t/f.    Education Documentation  Safety issues and interventions, taught by Davonna Belling, OT at 01/17/2022 12:50 PM.  Learner: Patient  Readiness:  Acceptance  Method: Explanation, Demonstration, Teach Back  Response: Verbalizes Understanding, Demonstrated Understanding, Needs Reinforcement, Able to Teach Back    Functional transfers/mobility, taught by Davonna Belling, OT at 01/17/2022 12:50 PM.  Learner: Patient  Readiness: Acceptance  Method: Explanation, Demonstration, Teach Back  Response: Verbalizes Understanding, Demonstrated Understanding, Needs Reinforcement, Able to Teach Back    Fall prevention/balance training, taught by Davonna Belling, OT at 01/17/2022 12:50 PM.  Learner: Patient  Readiness: Acceptance  Method: Explanation, Demonstration, Teach Back  Response: Verbalizes Understanding, Demonstrated Understanding, Needs Reinforcement, Able to Teach Back    ADL retraining, taught by Davonna Belling, OT at 01/17/2022 12:50 PM.  Learner: Patient  Readiness: Acceptance  Method: Explanation, Demonstration, Teach Back  Response: Verbalizes Understanding, Demonstrated Understanding, Needs Reinforcement, Able to Teach Back    Muscle weakness, taught by Davonna Belling, OT at 01/17/2022 11:00 AM.  Learner: Patient  Readiness: Acceptance  Method: Explanation, Demonstration, Teach Back  Response: Verbalizes Understanding, Demonstrated Understanding, Able to Teach Back, Needs Reinforcement    Reconditioning, taught by Davonna Belling, OT at 01/17/2022 11:00 AM.  Learner: Patient  Readiness: Acceptance  Method: Explanation, Demonstration, Teach Back  Response: Verbalizes Understanding, Demonstrated Understanding, Able to Teach Back, Needs Reinforcement    Verbalize detrimental effects of bedrest/inactivity, taught by Davonna Belling, OT at 01/17/2022 11:00 AM.  Learner: Patient  Readiness: Acceptance  Method: Explanation, Demonstration, Teach Back  Response: Verbalizes Understanding, Demonstrated Understanding, Able to Teach Back, Needs  Reinforcement    Signs/symptoms associated with debility, taught by Davonna BellingMazique, Rahiem Schellinger, OT at 01/17/2022 11:00 AM.  Learner:  Patient  Readiness: Acceptance  Method: Explanation, Demonstration, Teach Back  Response: Verbalizes Understanding, Demonstrated Understanding, Able to Teach Back, Needs Reinforcement    Safety issues and interventions, taught by Davonna BellingMazique, Clorine Swing, OT at 01/17/2022 11:00 AM.  Learner: Patient  Readiness: Acceptance  Method: Explanation, Demonstration, Teach Back  Response: Verbalizes Understanding, Demonstrated Understanding, Able to Teach Back, Needs Reinforcement    Rehab techniques/procedure, taught by Davonna BellingMazique, Rebbecca Osuna, OT at 01/17/2022 11:00 AM.  Learner: Patient  Readiness: Acceptance  Method: Explanation, Demonstration, Teach Back  Response: Verbalizes Understanding, Demonstrated Understanding, Able to Teach Back, Needs Reinforcement    Precautions, taught by Davonna BellingMazique, Steffany Schoenfelder, OT at 01/17/2022 11:00 AM.  Learner: Patient  Readiness: Acceptance  Method: Explanation, Demonstration, Teach Back  Response: Verbalizes Understanding, Demonstrated Understanding, Able to Teach Back, Needs Reinforcement    Plan of care, taught by Davonna BellingMazique, Melquan Ernsberger, OT at 01/17/2022 11:00 AM.  Learner: Patient  Readiness: Acceptance  Method: Explanation, Demonstration, Teach Back  Response: Verbalizes Understanding, Demonstrated Understanding, Able to Teach Back, Needs Reinforcement    Functional transfers/mobility, taught by Davonna BellingMazique, Jermisha Hoffart, OT at 01/17/2022 11:00 AM.  Learner: Patient  Readiness: Acceptance  Method: Explanation, Demonstration, Teach Back  Response: Verbalizes Understanding, Demonstrated Understanding, Able to Teach Back, Needs Reinforcement    Fall prevention/balance training, taught by Davonna BellingMazique, Judah Carchi, OT at 01/17/2022 11:00 AM.  Learner: Patient  Readiness: Acceptance  Method: Explanation, Demonstration, Teach Back  Response: Verbalizes Understanding, Demonstrated Understanding, Able to Teach Back, Needs Reinforcement    ADL retraining, taught by Davonna BellingMazique, Alysse Rathe, OT at 01/17/2022 11:00 AM.  Learner: Patient  Readiness:  Acceptance  Method: Explanation, Demonstration, Teach Back  Response: Verbalizes Understanding, Demonstrated Understanding, Able to Teach Back, Needs Reinforcement    Education Comments  No comments found.      Assessment & Plan   Patient demonstrated great participation this session with no symptoms of orthostasis throughout. She required heavy multimodal cues to maximize anterior leans throughout transfers. Continue OT POC focusing on static standing, STS t/f, body mechanics, and standing balance.     Plan:  Risks/Benefits/POC Discussed with Pt/Family: With patient  Treatment Interventions: ADL retraining, Functional transfer training, UE strengthening/ROM, Endurance training, Cognitive reorientation, Patient/Family training, Equipment eval/education, Neuro muscular reeducation, Fine motor coordination activities, Compensatory technique education    Recommendation:  Discharge Recommendation: Home with home health OT, Home with home health PT  OT Therapy Recommendation: 5-6 days/week, 60-120 mins/day, 1:1 treatment, Group therapy  OT Estimated Length of Stay: 14 days

## 2022-01-17 NOTE — Progress Notes (Addendum)
MEDICINE PROGRESS NOTE  [facility], DIVISION OF HOSPITALIST MEDICINE   [facility]   [place] Pager: [phone]      Date Time: 01/17/22 5:35 PM  Patient Name: [name]  Attending Physician: Anastasia Pall, [facility] Day: 15  Assessment:   66 year old female with past medical history significant for diabetes, hypertension, nonischemic cardiomyopathy, paroxysmal A-fib, anemia, bipolar, TIAs presented to [facility]  hospital on 2/7 with lower extremity weakness, paresthesias, transaminitis thought to have 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 Patient was discharged on 2/17 and went back to the hospital on 2/18 for continued management of rhabdomyolysis.  Patient noted to have left-sided weakness with associated difficulty with walking, standing, also developing bilateral hand and feet paresthesias and hand weakness,she was seen by neurology, work-up including MRIs, lumbar puncture done with no clear etiology,was transferred to [facility] for consideration for IVIG.  While at [facility], first dose of IVIG was started,but apparently,she developed allergic reaction therefore IVIG was discontinued. Diagnosis at the time of discharge stayed unclear,DDx was possible statin induced myositis vs. inflammatory myositis vs. immune mediated myositis.  She was also noted to have multiple vitamin deficiencies including D, zinc, B6, iron deficiency which are being replaced.  Patient was discharged to acute rehab on 3/14.    Recommendations:   Myositis: Unclear etiology including possible statin induced myositis versus inflammatory arthritis versus immune mediated myositis  -Attempted IVIG at [facility] unsuccessful due to development of allergic reaction  -Patient's access was removed - as the initial plan to plasma exchange was cancelled. 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 role of biopsy?      Hyponatremia,resolved likely hypovolemia based on low urine Na and urine osmolality   -Na 129---->134-->135   -monitor BMP periodically       metabolic acidosis,resolved  -HCO3 was 12 and now 16-->18-->21  -no s/o infection        Vitamin D, B6, B1, zinc deficiencies  -Continue multivitamin supplement  -Continue thiamine(received IV replacement 3/4-3/9), vitamin B6, zinc supplements       Normocytic anemia  -Noted TSAT of 21%, ferritin 492, normal folate level on 3/10;  -normal vitamin B12 level on 3/4  -cont ferrous sulfate  -Hgb has been stable       Moderate protein calorie malnutrition  -Weight loss, was on Ozempic but discontinued  -c/w glucerna and regular diet      Paroxysmal A-fib  -currently in sinus  -cont metoprolol XL 125 mg in evening (increased from 100 mg on 3/17)  -Continue Eliquis  -Follow heart rate      Orthostatic hypotension  -Responding to midodrine 5 mg in am and afternoon for now (continuing metoprolol at night with tachycardia and PAF)  -Noted today's blood pressure 116/73  -c/w compression stocking and abd binder  -she is on Flomax and need to monitor  her closely        Urinary retension  -Currently has a Foley catheter, failed voiding trial on 3/13   -has been restarted on Flomax in anticipation of voiding trial   -placed removed today and on voiding trial,monitor closely       COPD--Not in exacerbation   -continue Symbicort  -prn duonebs  -Pt had c/o shortness of breath  on 3/21-resolved  -no hypoxia noted/no fever  -Chest x-ray was done 3/21-no acute changes  -Patient subsequently felt better  -has been using her inhaler, no wheezing noted      History of nonischemic cardiopathy  -Echo 12/31/2021 with EF 43%, mild global hypokinesis  -Noted per cardiology note 3/13 with no  further plans for ischemic work-up as patient had cardiac catheterization in 2022 with no obstructive CAD  -outpatient follow-up for nonischemic cardiomyopathy work-up  -Repeat echo as outpatient       Bipolar disorder  -Continue home meds including Seroquel, ropinirole; home latuda 120mg  daily(pt has home med)     Diabetes  -Hemoglobin A1C 5.3% on 3/6  -Patient was on Ozempic but stopped due to malnutrition/weight loss  -No need for sliding-scale     H/o GERD  -c/w PPI      Constipation  -no abd pain or N/V,+flatus, already on pericolace, and started her on miralax on 3/26 and received a dose of dulcolax, if no BM in next 24 hrs then will need enema      DVT ppx:on Eliquis      Case was discussed with patient and RN    Subjective/ROS/24hr events:     CC: Myositis    Patient was seen after PT,with no complaints of dizziness/lightheadedness,  Patient denies any chest pain or shortness of breath.  No cough,afebrile,+constipation per pt and no BM yet,+flatus,no abd pain or N/V      Physical Exam:     Temp:  [97.9 F (36.6 C)-98.1 F (36.7 C)] 98.1 F (36.7 C)  Heart Rate:  [78-118] 107  Resp Rate:  [18] 18  BP: (95-116)/(61-73) 116/73    Intake/Output Summary (Last 24 hours) at 01/17/2022 1735  Last data filed at 01/17/2022 0900  Gross per 24 hour   Intake 500 ml   Output 575 ml   Net -75 ml    General: alert and awake, no distress  HEENT: anicteric, moist mucous membranes  Neck: supple, no JVD  CVS: nl S1, S2; no gallop or rub  Lungs: clear to auscultation b/l, no wheezing or rhonchi; normal resp effort  Abd: soft, non-tender, non-distended  Ext: no pedal edema, no cyanosis  Neuro: alert and awake, moving all extremities with overall weakness in LE especially        Meds:   Medications were reviewed:  Scheduled Meds:  Current Facility-Administered Medications   Medication Dose Route Frequency    apixaban  5 mg Oral Q12H SCH    budesonide-formoterol  2 puff Inhalation BID    ferrous sulfate  324 mg Oral QAM  W/BREAKFAST    lidocaine  1 patch Transdermal Q24H    lurasidone  120 mg Oral QPM    metoprolol succinate  125 mg Oral QHS    midodrine  5 mg Oral BID Meals    pantoprazole  40 mg Oral QAM AC    polyethylene glycol  17 g Oral Daily    QUEtiapine  100 mg Oral QHS    rOPINIRole  1 mg Oral QHS    senna-docusate  2 tablet Oral QHS    tamsulosin  0.4 mg Oral Daily after dinner    thiamine  100 mg Oral Daily    vitamin B-6  50 mg Oral Daily    vitamin D  25 mcg Oral Daily    vitamins/minerals  1 tablet Oral Daily at 1700    zinc sulfate  220 mg Oral Daily at 1700  Continuous Infusions:  PRN Meds:.acetaminophen, albuterol-ipratropium, bisacodyl, bisacodyl, diphenhydrAMINE, gabapentin, HYDROmorphone, lactulose, LORazepam, melatonin, promethazine  Labs/Radiology:   Imaging personally reviewed, including: all available   No results found.  No results for input(s): GLUCOSEWB in the last 24 hours.  Recent Labs   Lab 01/17/22  0643 01/15/22  0531 01/13/22  0436 01/12/22  1759   Sodium 135 134* 133* 130*   Potassium 4.2 5.0 4.4 5.3   Chloride 108 106 106 106   BUN 6.0* 7.0 8.0 8.0   Creatinine 0.6 0.7 0.7 0.8   EGFR >60.0 >60.0 >60.0 >60.0   Glucose 103* 99 92 117*   Calcium 9.0 9.2 9.2 9.5     Recent Labs   Lab 01/17/22  0643 01/13/22  0436 01/12/22  1759   WBC 7.03 8.81 10.14*   Hgb 8.6* 8.9* 10.0*   Hematocrit 26.8* 27.8* 31.6*   Platelets 378* 433* 333         Signed by: Richarda Osmond, MD,FACP

## 2022-01-17 NOTE — PT Progress Note (Signed)
Physical Therapy  Inpatient Rehabilitation Weekly Progress Note    Patient Name:  [name]       Medical Record Number: [id number]   Date of Birth: Apr 22, 1956  Sex: Female          Room/Bed:  M501/M501-02    Therapy Received:  Start Time: 1100   Stop Time: 1200  Total Therapy Minutes: 60    Rehabilitation Precautions/Restrictions:  Weight Bearing Status: no restrictions  Precaution Instructions Given to Patient: Yes  Restricted BP Precautions: other (comment) (abdominal binder and TED stockings donned)  Communication Precautions: Speak slowly  Other Precautions: falls, decreased LE strength/sensation, orthostatic hypotension, ABD binder, TED hose and ACE wraps as needed    Rehab Diagnosis: 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 Patient's BP remained WNL throughout session (see flowsheet). Today's session focused on evaluating items in the CARE tool as listed below. Additionally, patient completed 30 seconds of standing weight shifting using the RW as a single leg balance challenge. Patient also practiced WC mobility by weaving in and out of cones, VC needed to avoid knocking over cones. All needs met at the completion of the session.     Mobility Functional Status:   Current   Status Current Status Discharge Goal   Functional Area: Care Score:  Comments:    Roll Left and Right (P) 4 (P) Flat bed, use of handrails on bed Independent   Sit to Lying (P) 4 (P) HOB elevated Independent   Lying to Sitting on Side of Bed (P) 4 (P) HOB elevated Independent   Sit to Stand (P) 4 (P) With RW. VC given for anterior trunk/head  lean as patient has a tendency for retropulsion and genu recurvatum Independent   Chair/Bed-to-Chair Transfer (P) 4 (P) Squat pivot Independent   Car Transfer (P) 88 (P) Not attempted due to patient fatigue Independent   Walk 10 Feet (P) 1 (P) Mod A x 1 given for balance and to manage RW. Close WC follow by second set of hands. Patient ambulates with bilateral genu recurvatum during stance and LLE cross over midline during during swing and LLE initial contact Independent   Walk 50 Feet with Two Turns (P) 88 (P) Not attempted due to patient fatigue. Patient completed 5 x 15 feet of ambulation with Mod A x 1 given for balance and to manage RW. Close WC follow by second set of hands. Patient ambulates with bilateral genu recurvatum during stance and LLE cross over midline during during swing and LLE initial contact Independent   Walk 10 Feet on Uneven Surface (P) 88 (P) Not attempted due to patient fatigue Independent   Walk 150 Feet (P) 88 (P) Not attempted due to patient fatigue. Patient completed 5 x 15 feet of ambulation with Mod A x 1 given for balance and to manage RW. Close WC follow by second set of hands. Patient ambulates with bilateral genu recurvatum during stance and LLE cross over midline during during swing and LLE initial contact Independent   1 Step (Curb) (P) 88 (  P) Not attempted due to patient fatigue Supervision or touching assistance   4 Steps (P) 88 (P) Not attempted due to patient fatigue Supervision or touching assistance   12 Steps (P) 88 (P) Not attempted due to patient fatigue Supervision or touching assistance   Wheel 50 Feet with Two Turns (P) 4 (P) UE propulsion. Cues needed to avoid obstacles on ground Not applicable   Wheel 150 Feet (P) 88 (P) 100 feet of WC mobility completed. Patient fatigue prevents further WC mobility. Not applicable           Encounter Problems       Encounter Problems (Active)       PT - General and Outcome Measures       STG: Patient will tolerate Berg Balance  Assessment (Progressing)       Start:  01/04/22    Expected End:  01/11/22            STG: Patient will tolerate (Progressing)       Start:  01/04/22    Expected End:  01/11/22            STG: Patient will tolerate (Progressing)       Start:  01/04/22    Expected End:  01/11/22            STG: Patient will tolerate entire therapy session OOB. (Completed)       Start:  01/04/22    Expected End:  01/11/22    Resolved:  01/17/22         STG: Patient will tolerate a stair, curb and ramp assessment.  (Progressing)       Start:  01/04/22    Expected End:  01/11/22               PT - General and Outcome Measures       LTG: Patient will complete transfers from bed <> chair with recommended DME, independent assistance in order to maximize independence and prevent falls upon [place] home. (Progressing)       Start:  01/04/22            LTG: Patient will walk 150 ft with recommended DME, independent assistance in order to access areas of their home and community upon [place] home. (Progressing)       Start:  01/04/22            LTG: Patient will walk on uneven surfaces with recommended DME, independent assistance in order to access areas of their home and community upon [place] home. (Progressing)       Start:  01/04/22            LTG: Patient will ascend/descend 1 FOS with recommended DME, steadying assistance in order to safely and independently access their home/community (Progressing)       Start:  01/04/22            LTG: Patient/caregiver will return safe demonstration of proper supervision/assistance level for all mobility related needs in order to maximize safety upon transition to next level of care (Progressing)       Start:  01/04/22                   Education Documentation  Wheelchair management/mobility, taught by Marquis Buggy at 01/17/2022  4:25 PM.  Learner: Patient  Readiness: Acceptance  Method: Explanation  Response: Verbalizes Understanding    Safety issues and interventions, taught by Marquis Buggy at  01/17/2022  4:25 PM.  Learner: Patient  Readiness: Acceptance  Method: Explanation  Response: Verbalizes Understanding    Rehab techniques/procedure, taught by Marquis Buggy at 01/17/2022  4:25 PM.  Learner: Patient  Readiness: Acceptance  Method: Explanation  Response: Verbalizes Understanding    Precautions, taught by Marquis Buggy at 01/17/2022  4:25 PM.  Learner: Patient  Readiness: Acceptance  Method: Explanation  Response: Verbalizes Understanding    Plan of care, taught by Marquis Buggy at 01/17/2022  4:25 PM.  Learner: Patient  Readiness: Acceptance  Method: Explanation  Response: Verbalizes Understanding    Pain management, taught by Marquis Buggy at 01/17/2022  4:25 PM.  Learner: Patient  Readiness: Acceptance  Method: Explanation  Response: Verbalizes Understanding    Home exercise program, taught by Marquis Buggy at 01/17/2022  4:25 PM.  Learner: Patient  Readiness: Acceptance  Method: Explanation  Response: Verbalizes Understanding    Functional transfers/mobility, taught by Marquis Buggy at 01/17/2022  4:25 PM.  Learner: Patient  Readiness: Acceptance  Method: Explanation  Response: Verbalizes Understanding    Fall prevention/balance training, taught by Marquis Buggy at 01/17/2022  4:25 PM.  Learner: Patient  Readiness: Acceptance  Method: Explanation  Response: Verbalizes Understanding    Equipment, taught by Marquis Buggy at 01/17/2022  4:25 PM.  Learner: Patient  Readiness: Acceptance  Method: Explanation  Response: Verbalizes Understanding    Cognitive function, taught by Marquis Buggy at 01/17/2022  4:25 PM.  Learner: Patient  Readiness: Acceptance  Method: Explanation  Response: Verbalizes Understanding    ADL retraining, taught by Marquis Buggy at 01/17/2022  4:25 PM.  Learner: Patient  Readiness: Acceptance  Method: Explanation  Response: Verbalizes Understanding    Education Comments  No comments found.        Assessment & Plan   Patient tolerating entire session OOB today, with BP  remained WNL. Patient trialed ambulation (5 x 15 ft) for the first time today with Mod A x 1 given for balance and to manage RW. Close WC follow by second set of hands. Plan to continue gait training and weightbearing activity, with an emphasis on endurance training as patient has poor endurance. Patient will benefit from skilled PT to address these deficits and decrease BOC for a safe [place] home.     Plan:  Risks/Benefits/POC Discussed with Pt/Family: With patient  Treatment/Interventions: Gait training, Exercise, Stair training, Neuromuscular re-education, Functional transfer training, LE strengthening/ROM, Endurance training, Patient/family training, Equipment eval/education, Bed mobility, Compensatory technique education, Continued evaluation    Recommendation:  Discharge Recommendation: Home with home health PT, SNF (will continue to assess)  PT Therapy Recommendation: 5-6 days/week, 60-120 mins/day, 1:1 treatment  PT Estimated Length of Stay: 10-14 days from IE on 01/04/2022    Groups appropriate for patient:   Other (not group appropriate)

## 2022-01-17 NOTE — Plan of Care (Signed)
Problem: Bladder Management  Goal: LTG: Pt will be 100% continent and independent with bladder elim at [place]  Outcome: Progressing     Problem: Cardiac Function  Goal: LTG: Pt will be independent with BP and pulse management and medication at [place]  Outcome: Progressing     Problem: Pain Management  Goal: LTG: Pt will achieve good pain control with current medication regimen as evidenced by ability to participate in 100% of therapy sessions.  Outcome: Progressing     Problem: Safety Risk  Goal: LTG: Pt will recognize limitations and use call bell 100% of the time prior to transfers in order to prevent fsalls  Outcome: Progressing

## 2022-01-17 NOTE — SLP Progress Note (Signed)
Speech Language Pathology  Inpatient Rehabilitation Daily Progress Note    Patient Name:  [name]       Medical Record Number: [id number]   Date of Birth: [birth year]  Sex: Female          Room/Bed:  M501/M501-02    Therapy Received:  Start Time: 1230    Stop Time: 1330  Total Therapy Minutes: 60    Rehabilitation Precautions/Restrictions:  Weight Bearing Status: no restrictions  Precaution Instructions Given to Patient: Yes  Restricted BP Precautions: other (comment) (abdominal binder and TED stockings donned)  Communication Precautions: Speak slowly  Other Precautions: falls, decreased LE strength/sensation, orthostatic hypotension, ABD binder, TED hose and ACE wraps as needed    Rehab Diagnosis: 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    "Be Clear" protocol for dysarthria implemented to focus on improved speech intelligibility. In shaping clear speech, the patient benefits from the following cues: pre-planning pausing strategy by marking off spots on functional phrase list. Pt utilized clear speech per clinician judgment in 100% of personally-relevant functional phrases with no cueing. The patient's self-ratings of speech matched with clinician judgment in 0/2 of trials, indicating unreliable self-monitoring abilities at this time. Audio recordings on Voice Analyst app conducted with pt's verbal consent, with similar context and placement and rationale as last session. Phone placed on the table in front of pt with use of cookie theft picture to generate monologue in a closed room without auditory disturbance. Pt rating clarity as 9/10 on audio recording from last  session, compared to today's recording that clinician perceived to be of improved intelligibility but pt rating as 8/10.    Attempted to simulate phone call scenario which is a situation that pt notes that she may be asked to clarify herself X 1-2 over a 2 hour conversation based on self-report. Pt was intelligible over a 5-minute phone conversation without need for incidental cues. Improved clarity this session also coinciding with pt report of perception of reduced lingual thickness. Pt's whiteboard updated with reminder to use 'slow down' strategy for clarification    Goal setting discussion with pt noting improvements in memory, though still noting not returned to baseline with gaps noted in recalling details from daily or from previous day's events. Agreeable to using writing as a strategy and initiating use of tracking sheet next session, along with addressing medication management.     Of note, inter-rater reliability assessment conducted with assistance of senior SLP clinician whose ratings of intelligibility matched that of primary clinician.    External aid - pt used meal intake form to record details of at least 2 meals over 3 days, 3 meals noted on 2/3 days. Modeled fidelity of record to ensure consistency and adherence with uptake.    Transferred from bed to W/C with CGA at the end of session with pt independently making judgment on W/C positioning and safety cues (lifting handle for scoot-pivot transfer).    Education Documentation  Compensatory cognitive strategies/aids, taught by Barbara Cower, SLP at 01/17/2022  2:05 PM.  Learner: Patient  Readiness: Acceptance  Method: Explanation  Response: [name] Understanding  Communication strategies, taught by Barbara Cower, SLP at 01/17/2022  2:05 PM.  Learner: Patient  Readiness: Acceptance  Method: Explanation  Response: Verbalizes Understanding, Demonstrated Understanding, Needs Reinforcement    Education Comments  No comments  found.        Assessment & Plan   Brief period of SLP treatment, consistent with 3 sessions that was notable for spontaneous and skilled improvements in speech production. Emerging uptake with using tracking strategy for personal record keeping, particularly with meal intake. However, PO intake remains limited. Cognitively, pt reporting improvements and though is citing that she is not fully at baseline, no signficant effect noted on daily functioning per nursing handoff and clinical observation. Barrier to discharge may be physical > cognitive, particularly fatigue/endurance levels that requires need for S with ADLs. Upcoming session to target expanding use of external aid for daily tracking of events and assessing independence with premorbid IADLs (medication management), anticipate early discharge from skilled SLP with relatively short LOS at the hospital.    Plan:  Risks/Benefits/POC Discussed with Pt/Family: With patient  Treatment/Interventions: Speech/Language treatment, Cognitive linguistic retraining    Recommendation:  Discharge Recommendation: Home with supervision  SLP Therapy Recommendation: 5-6 days/week, 60-120 mins/day, 1:1 treatment  SLP Estimated Length of Stay: March 4    Swallow Recommendations:   Diet Solids Recommendation: regular  Diet Liquids Recommendations: thin consistency  Precautions/Compensations: Awake/alert, Upright 90 degrees for all oral intake  Recommended Form of Meds: PO, whole, with liquid

## 2022-01-17 NOTE — Plan of Care (Addendum)
Problem: Bladder Management  Goal: LTG: Pt will be 100% continent and independent with bladder elim at [place]  Outcome: Progressing.     Can be continent and incontinent of bladder at this time.    Problem: Cardiac Function  Goal: LTG: Pt will be independent with BP and pulse management and medication at [place]  Outcome: Progressing.    Sinus Tachy of HR 112 at bedtime, scheduled metoprolol given.    Problem: Pain Management  Goal: LTG: Pt will achieve good pain control with current medication regimen as evidenced by ability to participate in 100% of therapy sessions.  Outcome: Progressing.     C/O back pain, managed with scheduled/PRN pain medication. Slept well.    Problem: Safety Risk  Goal: LTG: Pt will recognize limitations and use call bell 100% of the time prior to transfers in order to prevent fsalls  Outcome: Progressing.     No safety issues, calls appropriately for assistance as needed.     Problem: Skin Wound Management  Goal: LTG: Excoriation at buttocks will resolve by [place]  Outcome: Progressing

## 2022-01-17 NOTE — Progress Notes (Signed)
PHYSICAL MEDICINE AND REHABILITATION  PROGRESS NOTE  FACE TO FACE ENCOUNTER    Date Time: 01/17/22 7:00 PM    Patient Name: [name],[name]  Admission date:  01/03/2022  (LOS: 14 days)    Subjective:     Patient seen this evening  Foley removed today, no voiding, will order bladder scans  Complained of smelly vaginal discharge, will order a dose of fluconazole for vulvovaginal candidiasis     Functional Status:     Patient tolerating entire session OOB today, with BP remained WNL. Patient trialed ambulation (5 x 15 ft) for the first time today with Mod A x 1 given for balance and to manage RW. Close WC follow by second set of hands. Plan to continue gait training and weightbearing activity, with an emphasis on endurance training as patient has poor endurance. Patient will benefit from skilled PT to address these deficits and decrease BOC for a safe  [place] home.     Medications:   Medication reviewed by me:     Scheduled Meds: PRN Meds:    apixaban, 5 mg, Oral, Q12H SCH  budesonide-formoterol, 2 puff, Inhalation, BID  ferrous sulfate, 324 mg, Oral, QAM W/BREAKFAST  fluconazole, 150 mg, Oral, Q24H SCH  lidocaine, 1 patch, Transdermal, Q24H  lurasidone, 120 mg, Oral, QPM  metoprolol succinate, 125 mg, Oral, QHS  midodrine, 5 mg, Oral, BID Meals  pantoprazole, 40 mg, Oral, QAM AC  polyethylene glycol, 17 g, Oral, Daily  QUEtiapine, 100 mg, Oral, QHS  rOPINIRole, 1 mg, Oral, QHS  senna-docusate, 2 tablet, Oral, QHS  tamsulosin, 0.4 mg, Oral, Daily after dinner  thiamine, 100 mg, Oral, Daily  vitamin B-6, 50 mg, Oral, Daily  vitamin D, 25 mcg, Oral, Daily  vitamins/minerals, 1 tablet, Oral, Daily at 1700  zinc sulfate, 220 mg, Oral, Daily at 1700        Continuous Infusions:   acetaminophen, 650 mg, Q4H PRN  albuterol-ipratropium, 3 mL, Q6H PRN  bisacodyl, 10 mg, Daily PRN  bisacodyl, 10 mg, Daily PRN  diphenhydrAMINE, 25 mg, Q6H PRN  gabapentin, 100 mg, TID PRN  HYDROmorphone, 2 mg, Q6H PRN  lactulose, 20 g, Q4H PRN  LORazepam,  0.5 mg, QHS PRN  melatonin, 3 mg, QHS PRN  promethazine, 25 mg, Q6H PRN          Medication Review  A complete drug regimen review was completed: Yes  Were drug issues were found during review: No   If yes to drug issues found during review:  What was the issue?:   What was the time the issue was identified?:   Was I contacted and action was taken by midnight of the next calendar day once issue was identified?: N/A   Person who contacted me:   Action taken:     Review of Systems:   A comprehensive review of systems was: No fevers, chills, nausea, vomiting, chest pain, shortness of breath, cough, headache, double vision.  All others negative.    Labs:     Recent Labs   Lab 01/17/22  0643 01/13/22  0436 01/12/22  1759   WBC 7.03 8.81 10.14*   Hgb 8.6* 8.9* 10.0*   Hematocrit 26.8* 27.8* 31.6*   Platelets 378* 433* 333      Recent Labs   Lab 01/17/22  0643 01/15/22  0531 01/13/22  0436 01/12/22  1759   Sodium 135 134* 133* 130*   Potassium 4.2 5.0 4.4 5.3   Chloride 108 106 106 106   CO2  21 18 16* 12*   BUN 6.0* 7.0 8.0 8.0   Creatinine 0.6 0.7 0.7 0.8   Calcium 9.0 9.2 9.2 9.5   Glucose 103* 99 92 117*         Rads:   Radiological Procedure reviewed.  Radiology Results (24 Hour)       ** No results found for the last 24 hours. **          Physical Exam:     Vitals:    01/17/22 1100 01/17/22 1112 01/17/22 1127 01/17/22 1640   BP: 97/61 97/61 107/71 116/73   Pulse: (!) 106 (!) 106 (!) 110 (!) 107   Resp:    18   Temp:    98.1 F (36.7 C)   TempSrc:    Oral   SpO2:   100% 98%   Weight:       Height:           Intake and Output Summary (Last 24 hours) at Date Time    Intake/Output Summary (Last 24 hours) at 01/17/2022 1900  Last data filed at 01/17/2022 1700  Gross per 24 hour   Intake 500 ml   Output 1000 ml   Net -500 ml     P.O.: 200 mL (01/17/22 0900)     Urine: 0 mL (01/17/22 0900)  Bladder Scan Volume (mL): 389 mL (01/17/22 1700)  Intermittent/Straight Cath (mL): 425 mL (01/17/22 1700)       Gen: Alert, NAD  Psych:  Pleasant and cooperative  Skin: Pink, warm, no rashes  HEENT: Atraumatic, normocephalic,   Chest: symmetric bilateral chest rise and fall,   CV: Regular rate and rhythm, no murmurs  Resp: breath sounds equal throughout; non labored  Abd: non-distended, soft nontender  Extr: Pink, warm,  Neuro:  Fluent speech     Assessment and Plan:     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   Occupational Therapy focused on improving strength, endurance, transfers, activities of daily living, and cognition for 60-120 mins/day, 5-6 days/week, for 14 days.     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%   Metoprolol,   losartan held due to hypotension     # COPD  Symbicort      # bipolar disorder  Latuda   Seroquel     # Pain Management   Daily monitoring   gabapentin  Acetaminophen, dilaudid PRN     #Bowel Management  constipation - Senna, Colace, Bisacodyl suppository PRN; hold for loose stools    #Bladder Management  Check PVRs    #Skin Management  Frequent skin assessment  Moisture barrier PRN    #FEN/GI with severe malnutrition   cardiac diet    #DVT PPx  eliquis    #Dispo  Pending rehab progress      Continue comprehensive and intensive inpatient rehab program, including:   Physical therapy 60-120 min daily, 5-6 times per week, Occupational therapy 60-120 min daily, 5-6 times per week, Case management and Rehabilitation nursing.  Psychology  services to evaluate and treat as needed     Signed by: Cheryll Dessert, MD   01/17/2022, 7:00 PM    [place] [facility]

## 2022-01-17 NOTE — Plan of Care (Signed)
Problem: Bladder Management  Goal: LTG: Pt will be 100% continent and independent with bladder elim at [place]  Outcome: Progressing  Note: Foley out today and pt has not spontaneously voided. + Yeast infection and on Diflucan. Order for bladder scan q 4 and I and O cath prn. Dependent on nursing for bladder management at this time.     Problem: Cardiac Function  Goal: LTG: Pt will be independent with BP and pulse management and medication at [place]  Outcome: Progressing  Note: Heart rate runs 100-108. Remains on remote telemetry. Education regarding Pulse and BP is ongoing with each med pass. Pt needs reinforcement of education.     Problem: Pain Management  Goal: LTG: Pt will achieve good pain control with current medication regimen as evidenced by ability to participate in 100% of therapy sessions.  Outcome: Progressing  Note: Generalized pain remains a concern. Pt is on Lidoderm patches and may have prn tylenol, dilaudid and gabapentin to manage.     Problem: Safety Risk  Goal: LTG: Pt will recognize limitations and use call bell 100% of the time prior to transfers in order to prevent fsalls  Outcome: Progressing  Note: Pt has progressed to the green level for falls risk. She uses her call bell appropriately and waits for help to arrive. For nursing she still requires hands on help with all transfers.     Problem: Skin Wound Management  Goal: LTG: Excoriation at buttocks will resolve by [place]  Outcome: Progressing  Note: Excoriation at buttocks slowly resolving. Pt with specialty bed to protect skin.     Problem: Compromised Tissue integrity  Goal: Damaged tissue is healing and protected  Outcome: Progressing  Flowsheets (Taken 01/10/2022 1940)  Damaged tissue is healing and protected:   Monitor/assess Braden scale every shift   Reposition patient every 2 hours and as needed unless able to reposition self   Increase activity as tolerated/progressive mobility   Relieve pressure to bony prominences for patients at moderate  and high risk   Avoid shearing injuries   Keep intact skin clean and dry   Use bath wipes, not soap and water, for daily bathing   Use incontinence wipes for cleaning urine, stool and caustic drainage. Foley care as needed   Monitor external devices/tubes for correct placement to prevent pressure, friction and shearing   Encourage use of lotion/moisturizer on skin   Monitor patient's hygiene practices   Utilize specialty bed  Goal: Nutritional status is improving  Outcome: Progressing  Flowsheets (Taken 01/10/2022 1940)  Nutritional status is improving:   Assist patient with eating   Allow adequate time for meals  Note: PO intake taken in qs amounts with encouragement.

## 2022-01-18 MED ORDER — NALOXONE HCL 0.4 MG/ML IJ SOLN (WRAP)
0.4000 mg | INTRAMUSCULAR | Status: DC | PRN
Start: 2022-01-18 — End: 2022-01-26

## 2022-01-18 NOTE — SLP Progress Note (Addendum)
Speech Language Pathology  Inpatient Rehabilitation Daily Progress Note    Patient Name:  [name]       Medical Record Number: [id number]   Date of Birth: 1956-04-17  Sex: Female          Room/Bed:  M501/M501-02    Therapy Received:  Start Time: 1300   Stop Time: 1400  Total Therapy Minutes: 60    Rehabilitation Precautions/Restrictions:  Weight Bearing Status: no restrictions  Precaution Instructions Given to Patient: Yes  Restricted BP Precautions: other (comment) (ACE wraps, abdominal binder)  Communication Precautions: Speak slowly  Other Precautions: falls, decreased LE strength/sensation, orthostatic hypotension, ABD binder, TED hose and ACE wraps as needed    Rehab Diagnosis: 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    ALFA used dynamically to assess readiness to return to medication management. Clinician demonstrated use of task segmentation with wh-q probes for stepwise problem solving to promote understanding of dosage calculation X 2/2 to facilitate subsequent filling of medication chart. Pt independently asking for repetition when breakdown in comprehension noted.     Goals: discussed plan to use phone to set up prospective memory alarms/triggers for fluid intake and improving recall/independence for medication intake to which pt was very agreeable.    Speech production: Required direct reminder of slow down strategy and reinforced retention with teachback at the end of session provided logical cue. Required incidental cue X 1 during hour for improving clarity.      PLOF: Pt reporting that prior to  this admission, daughter was needing to provide greater S with medication management, including initiating verbal reminders and checks. Pt also noting moments of inertia causing breakdowns in problem solving when filling out her pillbox, and needing dtr's assist.    Assessment:  Assessment of Language-Related Functional Activities (ALFA)  Understanding Medicine Labels (UM) Score: 7  Understanding Medicine Labels (UM) Functional Impairment: 2: indication of need for some level of assistance on this task, needs further exploration    Communicative Participation Item Bank (CPIB)  Raw score: 20; increase in overall total score from initial administration (Raw score - 11) suggests reduced interference in communicative participation     Education Documentation  Compensatory cognitive strategies/aids, taught by Barbara Cower, SLP at 01/18/2022  4:20 PM.  Learner: Patient  Readiness: Acceptance  Method: Explanation, Demonstration  Response: Verbalizes Understanding, Needs Reinforcement    Education Comments  No comments found.        Assessment & Plan   Though pt with emerging uptake of external aid for meal intake, desired effect on increasing motivation for PO intake not triggered. Reduced carryover and retention of speech strategies discussed in last session with cognitive functioning compounded by precipitating mental health conditions and nutritional deficiencies. Brief testing revealing that pt will need assist with medication management tasks and caregiver burden may be higher than it was premorbidly from a cognitive as well as physical standpoint due to general deconditioning exacerbated by consistently limited PO intake and orthostatic presentation which could pose as barrier to discharge  home. Appreciate team input that pt may be more appropriate for SNF discharge given her ongoing deficits. Note some degree of enthusiasm to use phone as an aid for prospective recall - to be addressed in upcoming  sessions    Plan:  Risks/Benefits/POC Discussed with Pt/Family: With patient  Treatment/Interventions: Speech/Language treatment, Cognitive linguistic retraining    Recommendation:  Discharge Recommendation: Home with supervision  SLP Therapy Recommendation: 5-6 days/week, 60-120 mins/day, 1:1 treatment  SLP Estimated Length of Stay: March 4    Swallow Recommendations:   Diet Solids Recommendation: regular  Diet Liquids Recommendations: thin consistency  Precautions/Compensations: Awake/alert, Upright 90 degrees for all oral intake  Recommended Form of Meds: PO, whole, with liquid

## 2022-01-18 NOTE — Progress Notes (Signed)
Pt medicated with Lactulose for constipation. Educated again that narcotics cause constipation and its been 7 days since last BM. Pt has poor po intake of both food and fluids.

## 2022-01-18 NOTE — PT Progress Note (Signed)
Physical Therapy  Inpatient Rehabilitation Daily Progress Note    Patient Name:  Stephene Alegria       Medical Record Number: [id number]   Date of Birth: [birth year]  Sex: Female          Room/Bed:  M501/M501-02    Therapy Received:  Start Time: 1100   Stop Time: 1200  Total Therapy Minutes: 60    Rehabilitation Precautions/Restrictions:  Weight Bearing Status: (P) no restrictions  Precaution Instructions Given to Patient: (P) Yes  Restricted BP Precautions: other (comment) (abdominal binder and TED stockings donned)  Communication Precautions: Speak slowly  Other Precautions: (P) falls, decreased LE strength/sensation, orthostatic hypotension, ABD binder, TED hose and ACE wraps as needed    Rehab Diagnosis: 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]  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 Patient's BP was 93/61, so ACE wraps and an abdominal binder were dep donned. BP remained WNL throughout session (see flowsheet). Today's session consisted of endurance training and 2 trials of weightbearing activity.     Patient completes all transfers by way of low squat pivot and SBA. Patient completed 2 x 5 minutes on the NuStep at level 0, with use of UE for UE/LE strength and endurance. Patient enjoyed listening to music as a therapeutic distraction during the NuStep. Then, patient completed  sit<>stand with Min A x 1 and VC for a strong anterior trunk lean. Patient took 5 steps with Mod A x 1 for balance. Second set of hands with close WC follow. Patient states she is not feeling well and needs to sit. After a rest, patient completed another sit<>stand and then 5 steps with same level of  assistance as above. During this trial, however, patient with strong anterior trunk lean and LE lagging far behind her body. Patient again requests to sit. Patient left with all needs met at the completion of the session in bed.       Education Materials engineer, taught by Marquis Buggy at 01/18/2022 12:58 PM.  Learner: Patient  Readiness: Acceptance  Method: Explanation  Response: Verbalizes Understanding    Safety issues and interventions, taught by Marquis Buggy at 01/18/2022 12:58 PM.  Learner: Patient  Readiness: Acceptance  Method: Explanation  Response: Verbalizes Understanding    Rehab techniques/procedure, taught by Marquis Buggy at 01/18/2022 12:58 PM.  Learner: Patient  Readiness: Acceptance  Method: Explanation  Response: Verbalizes Understanding    Precautions, taught by Marquis Buggy at 01/18/2022 12:58 PM.  Learner: Patient  Readiness: Acceptance  Method: Explanation  Response: Verbalizes Understanding    Plan of care, taught by Marquis Buggy at 01/18/2022 12:58 PM.  Learner: Patient  Readiness: Acceptance  Method: Explanation  Response: Verbalizes Understanding    Pain management, taught by Marquis Buggy at 01/18/2022 12:58 PM.  Learner: Patient  Readiness: Acceptance  Method: Explanation  Response: Verbalizes Understanding    Home management activities, taught by Marquis Buggy at 01/18/2022 12:58 PM.  Learner: Patient  Readiness: Acceptance  Method: Explanation  Response: Verbalizes Understanding    Home exercise program, taught by Marquis Buggy at 01/18/2022 12:58  PM.  Learner: Patient  Readiness: Acceptance  Method: Explanation  Response: Verbalizes Understanding    Functional transfers/mobility, taught by Marquis Buggy at 01/18/2022 12:58 PM.  Learner: Patient  Readiness: Acceptance  Method: Explanation  Response: [name] Understanding    Fall prevention/balance training, taught by Marquis Buggy at 01/18/2022 12:58 PM.  Learner: Patient  Readiness:  Acceptance  Method: Explanation  Response: Verbalizes Understanding    Equipment, taught by Marquis Buggy at 01/18/2022 12:58 PM.  Learner: Patient  Readiness: Acceptance  Method: Explanation  Response: [employer] resources, taught by Marquis Buggy at 01/18/2022 12:58 PM.  Learner: Patient  Readiness: Acceptance  Method: Explanation  Response: Verbalizes Understanding    Cognitive function, taught by Marquis Buggy at 01/18/2022 12:58 PM.  Learner: Patient  Readiness: Acceptance  Method: Explanation  Response: Verbalizes Understanding    ADL retraining, taught by Marquis Buggy at 01/18/2022 12:58 PM.  Learner: Patient  Readiness: Acceptance  Method: Explanation  Response: Verbalizes Understanding    Education Comments  No comments found.         Assessment & Plan   Patient trialing further weightbearing activity today, but with less tolerance than yesterday. BP remained WNL but patient still presenting with significant fatigue and decreased strength throughout mobility. Plan to continue weightbearing activity as tolerance allows. Patient will benefit from skilled PT to address these deficits to decrease BOC upon [place].     Plan:  Risks/Benefits/POC Discussed with Pt/Family: With patient  Treatment/Interventions: Gait training, Exercise, Stair training, Neuromuscular re-education, Functional transfer training, LE strengthening/ROM, Endurance training, Patient/family training, Equipment eval/education, Bed mobility, Compensatory technique education, Continued evaluation    Recommendation:  Discharge Recommendation: Home with home health PT, SNF (will continue to assess)  PT Therapy Recommendation: 5-6 days/week, 60-120 mins/day, 1:1 treatment  PT Estimated Length of Stay: 10-14 days from IE on 01/04/2022

## 2022-01-18 NOTE — OT Progress Note (Signed)
Occupational Therapy  Inpatient Rehabilitation Daily Progress Note    Patient Name:  [name] BirksCarol [name]       Medical Record Number: [id number]   Date of Birth: 02/23/1956  Sex: Female          Room/Bed:  M501/M501-02    Therapy Received:  Start Time: 1400   Stop Time: 1500  Total Therapy Minutes: 60    Rehabilitation Precautions/Restrictions:  Weight Bearing Status: no restrictions  Precaution Instructions Given to Patient: Yes  Restricted BP Precautions: other (comment) (ACE wraps, abdominal binder)  Communication Precautions: Speak slowly  Other Precautions: falls, decreased LE strength/sensation, orthostatic hypotension, ABD binder, TED hose and ACE wraps as needed    Rehab Diagnosis: 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]  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 Additionally donned abdominal binder in prep for standing activities. BP WNL throughout session. Noted untouched lunch therefore provided Glucerna which patient was able to drink within first 10 mins of session. Patient educated on importance of hydration and nutrition in prep for intensive therapy sessions. Brainstormed non-pharmacological strategies for increasing appetite including food diary or use of ginger ale to manage nausea.    Pt practiced STS t/f from wheelchair to elevated mat table with CGA and max VC to recall proper body positioning and sequence.     Patient able to sustain stand at ROM with attempt to engage her in for reaching activities to practice unilateral upper extremity support. Patient reporting that she was an able to complete additional activity and reported that she needed to  sit immediately with noted, severe retropulsion and stiff BLE and trunk extension. Assisted to seated position with Max A. Patient reporting that she experienced lightheadedness, however, blood pressure was within normal limits.    Patient, then transition to focus on mat work to promote full body strengthening. She transitioned into quadruped and into tall kneeling with minimal assistance. Patient with extreme difficulty, engaging hips and glutes into task, requiring physical assist, and Max encouragement to attempt. Patient complaining of pain and reported that she was unable to continue activity due to undefinable discomfort.    Transitioned into ring sit and long sit with CGA and lead patient through various stretches, targeting posterior chain, including hamstrings, glutes, and lower back.     Patient attempted additional stand and required min to mod assist to engage core. Patient reporting need to have bowel movement therefore transitioned back to room where she completed SPT using grabbars with overall mod assist for brief management and thorough posterior peri care.    Education Documentation  Muscle weakness, taught by Davonna BellingMazique, Karri Kallenbach, OT at 01/18/2022  4:53 PM.  Learner: Patient  Readiness: Acceptance  Method: Explanation  Response: Trenton GammonVerbalizes Understanding, Needs Reinforcement    Emotional adjustments/depression, taught by Davonna BellingMazique, Kaladin Noseworthy, OT at 01/18/2022  4:53 PM.  Learner: Patient  Readiness: Acceptance  Method: Explanation  Response: Trenton GammonVerbalizes Understanding, Needs Reinforcement    Reconditioning, taught by Davonna BellingMazique, Krystal Teachey, OT at 01/18/2022  4:53 PM.  Learner: Patient  Readiness: Acceptance  Method: Explanation  Response: Verbalizes Understanding, Needs  Reinforcement    Verbalize detrimental effects of bedrest/inactivity, taught by Davonna Belling, OT at 01/18/2022  4:53 PM.  Learner: Patient  Readiness: Acceptance  Method: Explanation  Response: Verbalizes Understanding, Needs  Reinforcement    Signs/symptoms associated with debility, taught by Davonna Belling, OT at 01/18/2022  4:53 PM.  Learner: Patient  Readiness: Acceptance  Method: Explanation  Response: Verbalizes Understanding, Needs Reinforcement    Education Comments  No comments found.      Assessment & Plan   Session extremely limited by patient's pain and anxiety toward progression of mobility activities with noted inconsistencies in certain movements that cause her pain but other similar movements that do not elicit the same response. Patient presents with significant anxiety that impacts her ability to complete standing activities, due to her fear of falling and losing consciousness although this is not occurred since her first day in rehab.     Plan:  Risks/Benefits/POC Discussed with Pt/Family: With patient  Treatment Interventions: ADL retraining, Functional transfer training, UE strengthening/ROM, Endurance training, Cognitive reorientation, Patient/Family training, Equipment eval/education, Neuro muscular reeducation, Fine motor coordination activities, Compensatory technique education    Recommendation:  Discharge Recommendation: Home with home health OT, Home with home health PT  OT Therapy Recommendation: 5-6 days/week, 60-120 mins/day, 1:1 treatment, Group therapy  OT Estimated Length of Stay: 14 days

## 2022-01-18 NOTE — Care and Service Plan (Signed)
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    Hypokalemia 12/26/2021    Thoracic back pain 12/26/2021    Gastroparesis 12/26/2021    Weakness of both legs 12/25/2021    Guillain Barr syndrome 12/25/2021    History of asthma 06/16/2019    History of transient ischemic attack (TIA) 06/16/2019    Type 2 diabetes mellitus 06/16/2019    Syncope and collapse 06/16/2019    Left-sided weakness 05/20/2019    Chest pain with moderate risk for cardiac etiology 05/11/2019    Non-traumatic subcutaneous emphysema 01/07/2019    Chest pain with high risk of acute coronary syndrome 01/06/2019    Paroxysmal atrial fibrillation 01/06/2019    Hypertension 01/06/2019    Hyperlipidemia 01/06/2019    Seizure disorder 01/06/2019    History of gastrointestinal hemorrhage 01/06/2019    Depression 01/06/2019    Anemia 01/06/2019    Asthma 01/06/2019    Chest pain 08/22/2018    CVA (cerebral vascular accident) 08/21/2018       Vital Signs  Blood Pressure: 122/72  Temperature: 98.1 F (36.7 C)  Pulse: (!) 102  Respirations: 18  Pain Scale Used: Numeric Scale (0-10)  Pain Score: 7  Pain Location: Back  Pain Orientation: Lower  Pain Descriptors: Aching  Pain Frequency: Intermittent      Weight and Nutrition  Admission Weight: 60.8 kg (134 lb 1.6 oz)  Current Weight: 58.6 kg (129 lb 3 oz)  Diet Type: Regular, Thin Liquid    Plan of Care  Anticipated Discharge Date: 1 wk SNF  Discharge Plan: Daughters home  Fall Risk Level: [place] Si  Is the duration of therapy 15 hours over 7 days instead of the standard 3 hours of therapy, 5 out of 7 days a week?: No  The patient will  benefit from continued services by:   Physical Therapy Recommendation: 5-6 days/week, 60-120 mins/day, 1:1 treatment  Occupational Therapy Recommendation: 5-6 days/week, 60-120 mins/day, 1:1 treatment, Group therapy  Speech Language Pathology Recommendation: 5-6 days/week, 60-120 mins/day, 1:1 treatment      The following is a list of patient problems that have been identified by the interdisciplinary team:   Encounter Problems       Encounter Problems (Active)       Bladder Management       LTG: Pt will be 100% continent and independent with bladder elim at [place] (Progressing)       Start:  01/03/22    Expected End:  01/29/22               Cardiac Function       LTG: Pt will be independent with BP and pulse management and medication at [place] (Progressing)       Start:  01/03/22    Expected End:  01/29/22               Communication       Patient will be able to communicate wants/needs regarding POC utilizing compenstatory speech strategies at the discourse  level with caregiver support as indicated. (Progressing)             Compromised Tissue integrity       Damaged tissue is healing and protected (Progressing)       Start:  01/04/22       Interventions:  1. Monitor/assess Braden scale every shift  2. Provide wound care per wound care algorithm  3. Reposition patient every 2 hours and as needed unless able to reposition self  4. Increase activity as tolerated/progressive mobility  5. Relieve pressure to bony prominences for patients at moderate and high risk  6. Avoid shearing injuries   7. Keep intact skin clean and dry  8. Use bath wipes, not soap and water, for daily bathing   9. Use incontinence wipes for cleaning urine, stool and caustic drainage; Foley care as needed   10. Monitor external devices/tubes for correct placement to prevent pressure, friction and shearing   11. Encourage use of lotion/moisturizer on skin  12. Monitor patient's hygiene practices  13. Consult/collaborate with wound care nurse    14. Utilize specialty bed  15. Consider placing an indwelling catheter if incontinence interferes with healing of stage 3 or 4 pressure injury       Initial    Damaged tissue is healing and protected : Monitor/assess Braden scale every shift;Reposition patient every 2 hours and as needed unless able to reposition self;Increase activity as tolerated/progressive mobility;Relieve pressure to bony prominences for patients at moderate and high risk;Avoid shearing injuries;Keep intact skin clean and dry;Use bath wipes, not soap and water, for daily bathing;Use incontinence wipes for cleaning urine, stool and caustic drainage. Foley care as needed;Monitor external devices/tubes for correct placement to prevent pressure, friction and shearing;Encourage use of lotion/moisturizer on skin;Monitor patient's hygiene practices;Utilize specialty bed         Nutritional status is improving (Progressing)       Start:  01/04/22       Interventions:  1. Assist patient with eating   2. Allow adequate time for meals   3. Encourage patient to take dietary supplement(s) as ordered   4. Collaborate with Clinical Nutritionist  5. Include patient/patient care companion in decisions related to nutrition       Initial    Nutritional status is improving: Assist patient with eating;Allow adequate time for meals      Goal Note       PO intake taken in qs amounts with encouragement.                 Hemodynamic Status: Cardiac       Stable vital signs and fluid balance (Progressing)       Start:  01/04/22    Expected End:  01/06/22       Interventions:  1. Monitor/assess vital signs and telemetry per unit protocol  2. Weigh on admission and record weight daily  3. Assess signs and symptoms associated with cardiac rhythm changes  4. Monitor intake/output per unit protocol and/or LIP order  5. Monitor lab values  6. Monitor for leg swelling/edema and report to LIP if abnormal       Initial    Stable vital signs and fluid balance: Monitor/assess  vital signs and telemetry per unit protocol;Assess signs and symptoms associated with cardiac rhythm changes;Monitor intake/output per unit protocol and/or LIP order;Monitor for leg swelling/edema and report to LIP if abnormal;Monitor lab values;Weigh on admission and record weight daily  Inadequate Tissue Perfusion       Adequate tissue perfusion will be maintained (Progressing)       Start:  01/04/22    Expected End:  01/06/22       Interventions:  1. Monitor/assess vital signs  2. Monitor/assess lab values and report abnormal values  3. Monitor/assess neurovascular status (pulses, capillary refill, pain, paresthesia, paralysis, presence of edema)  4. Monitor intake and output  5. Monitor/assess for signs of VTE (edema of calf/thigh redness, pain)  6. Monitor for signs and symptoms of a pulmonary embolism (dyspnea, tachypnea, tachycardia, confusion)  7. VTE Prevention: Administer anticoagulant(s) and/or apply anti-embolism stockings/devices as ordered  8. Encourage/assist patient as needed to turn, cough, and perform deep breathing every 2 hours  9. Reinforce use of ordered respiratory interventions (i.e. CPAP, BiPAP, Incentive Spirometer, Acapella, etc.)  10. Perform active/passive ROM  11. Reinforce ankle pump exercises  12. Increase mobility as tolerated/progressive mobility  13. Elevate feet  14. Position patient for maximum circulation/cardiac output  15. Place shoes or other foot protection on patient  16. Assess and monitor skin integrity  17. Provide wound/skin care       Initial    Adequate tissue perfusion will be maintained: Monitor/assess vital signs;Monitor/assess lab values and report abnormal values;Monitor/assess neurovascular status (pulses, capillary refill, pain, paresthesia, paralysis, presence of edema);Monitor intake and output;Monitor for signs and symptoms of a pulmonary embolism (dyspnea, tachypnea, tachycardia, confusion);Encourage/assist patient as needed to turn, cough, and  perform deep breathing every 2 hours;Reinforce use of ordered respiratory interventions (i.e. CPAP, BiPAP, Incentive Spirometer, Acapella, etc.)            Mobility       LTG: Patient will ambulate >150 feet at a Mod I level and navigate 1 FOS with CGA, both with LRAD in order to access the home and community environments.  (Progressing)             Nutrition       Nutritional intake is adequate (Progressing)       Start:  01/04/22    Expected End:  01/06/22       Interventions:  1. Monitor daily weights  2. Assist patient with meals/food selection  3. Allow adequate time for meals  4. Encourage/perform oral hygiene as appropriate   5. Encourage/administer dietary supplements as ordered (i.e. tube feed, TPN, oral, OGT/NGT, supplements)  6. Consult/collaborate with Clinical Nutritionist  7. Include patient/patient care companion in decisions related to nutrition  8. Assess anorexia, appetite, and amount of meal/food tolerated  9. Consult/collaborate with Speech Therapy (swallow evaluations)       Initial    Nutritional intake is adequate: Monitor daily weights;Assist patient with meals/food selection;Encourage/perform oral hygiene as appropriate;Encourage/administer dietary supplements as ordered (i.e. tube feed, TPN, oral, OGT/NGT, supplements);Assess anorexia, appetite, and amount of meal/food tolerated;Consult/collaborate with Clinical Nutritionist;Include patient/patient care companion in decisions related to nutrition;Consult/collaborate with Speech Therapy (swallow evaluations);Allow adequate time for meals            Pain Management       LTG: Pt will achieve good pain control with current medication regimen as evidenced by ability to participate in 100% of therapy sessions. (Progressing)       Start:  01/03/22    Expected End:  01/29/22               Safety Risk       LTG: Pt will recognize limitations and use call bell 100% of the time  prior to transfers in order to prevent fsalls (Progressing)       Start:   01/03/22    Expected End:  01/29/22               Self Care Management       LTG: Pt will perform ADL routine at spv level with use of compensatory strategies and AE prn upon d/c.  (Progressing)             Skin Wound Management       LTG: Excoriation at buttocks will resolve by [place] (Progressing)       Start:  01/03/22    Expected End:  01/29/22         Goal Note       Excoriation at buttocks slowly resolving. Pt with specialty bed to protect skin.                     Rehab Team Discussion        Self Care Functional Status:   Admission Assessment Current Status Discharge  Goal   Functional Area: Care Score:  Care Score:    Eating 5 6 (01/13/22 1100) 6   Oral Hygiene 5 5 (01/04/22 0900) 6   Toileting Hygiene 7 2 (01/04/22 0900) 6   Shower/Bathe Self 88 4 (01/13/22 0700) 4   Upper Body Dressing 4 5 (01/13/22 1100) 6   Lower Body Dressing 2 3 (01/13/22 0700) 4   Putting On/Taking Off Footwear 7 5 (01/13/22 1100) 4       Mobility Functional Status:   Admission Assessment Current   Status  Discharge   Goal   Functional Area: Care Score:  Care Score:    Roll Left and Right 4 4 (01/17/22 1100) 6   Sit to Lying 4 4 (01/17/22 1100) 6   Lying to Sitting on Side of Bed 3 4 (01/17/22 1100) 6   Sit to Stand 3 4 (01/17/22 1100) 6   Chair/Bed-to-Chair Transfer 3 4 (01/17/22 1100) 6   Toilet Transfer 7 88 (01/04/22 0900) 6   Car Transfer 88 88 (01/17/22 1100) 6   Walk 10 Feet 88 1 (01/17/22 1100) 6   Walk 50 Feet with Two Turns 88 88 (01/17/22 1100) 6   Walk 150 Feet 88 88 (01/17/22 1100) 6   Walking 10 Feet on Uneven Surfaces 88 88 (01/17/22 1100) 6   1 Step (Curb) 88 88 (01/17/22 1100) 4   4 Steps 88 88 (01/17/22 1100) 4   12 Steps 88 88 (01/17/22 1100) 4   Wheel 50 Feet with Two Turns 9 4 (01/17/22 1100) 9   Wheel 150 Feet 9 88 (01/17/22 1100) 9   Picking Up Object 88 88 (01/04/22 0900) Independent (with use of AE)

## 2022-01-18 NOTE — Progress Notes (Signed)
PHYSICAL MEDICINE AND REHABILITATION  PROGRESS NOTE  FACE TO Bruce Donath    Date Time: 01/18/22 6:25 PM    Patient Name: [name],[name]  Admission date:  01/03/2022  (LOS: 15 days)    Subjective:     Patient denies pain. Slept well. Having regular bowel movements. No new issues.   Starting to work on walking     Functional Status:     Session extremely limited by patient's pain and anxiety toward progression of mobility activities with noted inconsistencies in certain movements that cause her pain but other similar movements that do not elicit the same response. Patient presents with significant anxiety that impacts her ability to complete standing activities, due to her fear of falling and losing consciousness although this is not occurred since her first day in rehab.     Medications:   Medication reviewed by me:     Scheduled Meds: PRN Meds:    apixaban, 5 mg, Oral, Q12H SCH  budesonide-formoterol, 2 puff, Inhalation, BID  ferrous sulfate, 324 mg, Oral, QAM W/BREAKFAST  lidocaine, 1 patch, Transdermal, Q24H  lurasidone, 120 mg, Oral, QPM  metoprolol succinate, 125 mg, Oral, QHS  midodrine, 5 mg, Oral, BID Meals  pantoprazole, 40 mg, Oral, QAM AC  polyethylene glycol, 17 g, Oral, Daily  QUEtiapine, 100 mg, Oral, QHS  rOPINIRole, 1 mg, Oral, QHS  senna-docusate, 2 tablet, Oral, QHS  tamsulosin, 0.4 mg, Oral, Daily after dinner  thiamine, 100 mg, Oral, Daily  vitamin B-6, 50 mg, Oral, Daily  vitamin D, 25 mcg, Oral, Daily  vitamins/minerals, 1 tablet, Oral, Daily at 1700  zinc sulfate, 220 mg, Oral, Daily at 1700        Continuous Infusions:   acetaminophen, 650 mg, Q4H PRN  albuterol-ipratropium, 3 mL, Q6H PRN  bisacodyl, 10 mg, Daily PRN  bisacodyl, 10 mg, Daily PRN  diphenhydrAMINE, 25 mg, Q6H PRN  gabapentin, 100 mg, TID PRN  HYDROmorphone, 2 mg, Q6H PRN  lactulose, 20 g, Q4H PRN  LORazepam, 0.5 mg, QHS PRN  melatonin, 3 mg, QHS PRN  naloxone, 0.4 mg, PRN  promethazine, 25 mg, Q6H PRN          Medication  Review  A complete drug regimen review was completed: Yes  Were drug issues were found during review: No   If yes to drug issues found during review:  What was the issue?:   What was the time the issue was identified?:   Was I contacted and action was taken by midnight of the next calendar day once issue was identified?: N/A   Person who contacted me:   Action taken:     Review of Systems:   A comprehensive review of systems was: No fevers, chills, nausea, vomiting, chest pain, shortness of breath, cough, headache, double vision.  All others negative.    Labs:     Recent Labs   Lab 01/17/22  0643 01/13/22  0436 01/12/22  1759   WBC 7.03 8.81 10.14*   Hgb 8.6* 8.9* 10.0*   Hematocrit 26.8* 27.8* 31.6*   Platelets 378* 433* 333      Recent Labs   Lab 01/17/22  0643 01/15/22  0531 01/13/22  0436 01/12/22  1759   Sodium 135 134* 133* 130*   Potassium 4.2 5.0 4.4 5.3   Chloride 108 106 106 106   CO2 21 18 16* 12*   BUN 6.0* 7.0 8.0 8.0   Creatinine 0.6 0.7 0.7 0.8   Calcium 9.0 9.2 9.2  9.5   Glucose 103* 99 92 117*         Rads:   Radiological Procedure reviewed.  Radiology Results (24 Hour)       ** No results found for the last 24 hours. **          Physical Exam:     Vitals:    01/18/22 1158 01/18/22 1413 01/18/22 1429 01/18/22 1728   BP: 122/72 123/69 111/63 118/73   Pulse: (!) 102 (!) 103 (!) 105 (!) 101   Resp:    18   Temp:       TempSrc:       SpO2:    100%   Weight:       Height:           Intake and Output Summary (Last 24 hours) at Date Time    Intake/Output Summary (Last 24 hours) at 01/18/2022 1825  Last data filed at 01/18/2022 1324  Gross per 24 hour   Intake 240 ml   Output 1200 ml   Net -960 ml     P.O.: 240 mL (01/18/22 1000)     Urine: 0 mL (01/18/22 1000)  Bladder Scan Volume (mL): 441 mL (01/18/22 1324)  Intermittent/Straight Cath (mL): 600 mL (01/18/22 1324)       Gen: Alert, NAD  Psych: Pleasant and cooperative  Skin: Pink, warm, no rashes  HEENT: Atraumatic, normocephalic,   Chest: symmetric  bilateral chest rise and fall,   CV: Regular rate and rhythm, no murmurs  Resp: breath sounds equal throughout; ctax2  Abd: non-distended, +BS  Extr: Pink, warm,  Neuro:  Moving all ext    Assessment and Plan:     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   Occupational Therapy focused on improving strength, endurance, transfers, activities of daily living, and cognition for 60-120 mins/day, 5-6 days/week, for 14 days.     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%   Metoprolol,   losartan held due to hypotension     # COPD  Symbicort      # bipolar disorder  Latuda   Seroquel     # Pain Management   Daily monitoring   gabapentin  Acetaminophen, dilaudid PRN     #Bowel Management  constipation - Senna, Colace, Bisacodyl suppository PRN; hold for loose stools    #Bladder Management  Check PVRs    #Skin Management  Frequent skin assessment  Moisture barrier PRN    #FEN/GI with severe malnutrition   cardiac diet    #DVT PPx  eliquis    #Dispo  Pending rehab progress      Continue comprehensive and intensive inpatient rehab program, including:   Physical therapy 60-120 min daily, 5-6 times per week, Occupational therapy 60-120 min daily, 5-6 times per week, Case management and Rehabilitation nursing.  Psychology services to evaluate and treat as needed     Signed by: Cheryll Dessert, MD   01/18/2022, 6:25 PM    [facility]

## 2022-01-18 NOTE — Plan of Care (Signed)
Problem: Bladder Management  Goal: LTG: Pt will be 100% continent and independent with bladder elim at [place]  Outcome: Progressing: pt is retaining and in and out cath q4hrs is done per order.      Problem: Cardiac Function  Goal: LTG: Pt will be independent with BP and pulse management and medication at [place]  Outcome: Progressing: on tele monitor, Afib BP IS low this morning. Ted hose and abdominal bender is applied      Problem: Pain Management  Goal: LTG: Pt will achieve good pain control with current medication regimen as evidenced by ability to participate in 100% of therapy sessions.  Outcome: Progressing   Pt request 2mg  dilaudid q6 hrs. Complaining of lower back pain 8/10  Problem: Safety Risk  Goal: LTG: Pt will recognize limitations and use call bell 100% of the time prior to transfers in order to prevent fsalls  Outcome: Progressing: pt able to call for her needs, bed alarm and chair alarms are on at all time

## 2022-01-18 NOTE — Progress Notes (Signed)
MEDICINE PROGRESS NOTE  [facility], DIVISION OF HOSPITALIST MEDICINE   [facility]   [place] Pager: [phone]      Date Time: 01/18/22 10:33 AM  Patient Name: [name],[name]  Attending Physician: Anastasia PallPerez Gonzalez, Edson SnowballCarlos RState [facility]*  Hospital Day: 16  Assessment:   66 year old female with past medical history significant for diabetes, hypertension, nonischemic cardiomyopathy, paroxysmal A-fib, anemia, bipolar, TIAs 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 Patient was discharged on 2/17 and went back to the hospital on 2/18 for continued management of rhabdomyolysis.  Patient noted to have left-sided weakness with associated difficulty with walking, standing, also developing bilateral hand and feet paresthesias and hand weakness,she was seen by neurology, work-up including MRIs, lumbar puncture done with no clear etiology,was [facility].  While at [facility], first dose of IVIG was started,but apparently,she developed allergic reaction therefore IVIG was discontinued. Diagnosis at the time of discharge stayed unclear,DDx was possible statin induced myositis vs. inflammatory myositis vs. immune mediated myositis.  She was also noted to have multiple vitamin deficiencies including D, zinc, B6, iron deficiency which are being replaced.  Patient was discharged to acute rehab on 3/14.    Recommendations:   Myositis: Unclear etiology including possible statin induced myositis versus inflammatory arthritis versus immune mediated myositis  -[facility]ed [facility] unsuccessful due to development of allergic reaction  -Patient's access was removed - as the initial plan to plasma exchange was cancelled. 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 role of biopsy?    Hyponatremia,resolved likely hypovolemia based on low urine Na and urine osmolality   -Sodium stable including a value of 135 on 3/27  -monitor BMP periodically     Improved metabolic acidosis  -HCO3 as low as 12 on 3/22 and improved at this time including labs 3/27    Vitamin D, B6, B1, zinc deficiencies  -Continue multivitamin supplement  -Continue thiamine(received IV replacement 3/4-3/9), vitamin B6, zinc supplements     Normocytic anemia  -Noted TSAT of 21%, ferritin 492, normal folate level on 3/10;  -normal vitamin B12 level on 3/4  -cont ferrous sulfate  -Hgb has been stable     Moderate protein calorie malnutrition  -Weight loss, was on Ozempic but discontinued  -c/w glucerna and regular diet    Paroxysmal A-fib  -currently in sinus with intermittent tachycardia  -cont metoprolol XL 125 mg in evening (increased from 100 mg on 3/17)  -Continue Eliquis  -Follow heart rate    Orthostatic hypotension  -Responding to midodrine 5 mg in am and afternoon for now (continuing metoprolol at night with tachycardia and PAF)  -c/w compression stocking and abd binder  -Initially on Flomax; may consider discontinuing if significant orthostatic hypotension     Urinary retension  -Currently has a Foley catheter, failed voiding trial on 3/13   -Monitor with voiding trial status post removal of Foley catheter 3/27 and monitor with voiding trial  -Continue Flomax for now, follow BP    COPD--Not in exacerbation   -continue Symbicort  -prn duonebs  -Chest x-ray was done 3/21-no acute changes    History of nonischemic cardiopathy  -Echo 12/31/2021  with EF 43%, mild global hypokinesis  -Noted per cardiology note 3/13 with no further plans for ischemic work-up as patient had cardiac catheterization in 2022 with no obstructive  CAD  -outpatient follow-up for nonischemic cardiomyopathy work-up  -Repeat echo as outpatient     Bipolar disorder  -Continue home meds including Seroquel, ropinirole; home latuda 120mg  daily(pt has home med)     Diabetes  -Hemoglobin A1C 5.3% on 3/6  -Patient was on Ozempic but stopped due to malnutrition/weight loss  -No need for sliding-scale     H/o GERD  -c/w PPI    Constipation  -Continue bowel regimen    DVT ppx:on Eliquis as above    Discussed with patient and nurse    Subjective/ROS/24hr events:     CC: Myositis    HPI: Patient seen and examined.  Patient feeling okay presently.  Patient denies any chest pain or shortness of breath.  No acute events overnight.      Physical Exam:     Temp:  [98.1 F (36.7 C)] 98.1 F (36.7 C)  Heart Rate:  [97-118] 97  Resp Rate:  [18] 18  BP: (97-118)/(61-73) 118/73    Intake/Output Summary (Last 24 hours) at 01/18/2022 1033  Last data filed at 01/18/2022 0600  Gross per 24 hour   Intake --   Output 1025 ml   Net -1025 ml      General: alert and awake, no distress  HEENT: anicteric, moist mucous membranes  Neck: supple, no JVD  CVS: nl S1, S2; no gallop or rub  Lungs: clear to auscultation b/l, no wheezing or rhonchi; normal resp effort  Abd: soft, non-tender, non-distended  Ext: no pedal edema, no cyanosis  Neuro: alert and awake, moving all extremities with overall weakness in LE especially        Meds:   Medications were reviewed:  Scheduled Meds:  Current Facility-Administered Medications   Medication Dose Route Frequency    apixaban  5 mg Oral Q12H SCH    budesonide-formoterol  2 puff Inhalation BID    ferrous sulfate  324 mg Oral QAM W/BREAKFAST    lidocaine  1 patch Transdermal Q24H    lurasidone  120 mg Oral QPM    metoprolol succinate  125 mg Oral QHS    midodrine  5 mg Oral BID Meals    pantoprazole  40 mg Oral QAM AC    polyethylene glycol  17 g Oral Daily    QUEtiapine  100 mg Oral QHS    rOPINIRole  1 mg Oral QHS    senna-docusate  2 tablet Oral QHS     tamsulosin  0.4 mg Oral Daily after dinner    thiamine  100 mg Oral Daily    vitamin B-6  50 mg Oral Daily    vitamin D  25 mcg Oral Daily    vitamins/minerals  1 tablet Oral Daily at 1700    zinc sulfate  220 mg Oral Daily at 1700     Continuous Infusions:  PRN Meds:.acetaminophen, albuterol-ipratropium, bisacodyl, bisacodyl, diphenhydrAMINE, gabapentin, HYDROmorphone, lactulose, LORazepam, melatonin, promethazine  Labs/Radiology:   Imaging personally reviewed, including: all available   No results found.  No results for input(s): GLUCOSEWB in the last 24 hours.  Recent Labs   Lab 01/17/22  0643 01/15/22  0531 01/13/22  0436 01/12/22  1759   Sodium 135 134* 133* 130*   Potassium 4.2 5.0 4.4 5.3   Chloride 108 106 106 106   BUN 6.0* 7.0  8.0 8.0   Creatinine 0.6 0.7 0.7 0.8   EGFR >60.0 >60.0 >60.0 >60.0   Glucose 103* 99 92 117*   Calcium 9.0 9.2 9.2 9.5       Recent Labs   Lab 01/17/22  0643 01/13/22  0436 01/12/22  1759   WBC 7.03 8.81 10.14*   Hgb 8.6* 8.9* 10.0*   Hematocrit 26.8* 27.8* 31.6*   Platelets 378* 433* 333           Signed by: Ilda Foil, MD,FACP

## 2022-01-19 DIAGNOSIS — E876 Hypokalemia: Secondary | ICD-10-CM

## 2022-01-19 DIAGNOSIS — R55 Syncope and collapse: Secondary | ICD-10-CM

## 2022-01-19 MED ORDER — MIDODRINE HCL 5 MG PO TABS
7.5000 mg | ORAL_TABLET | Freq: Two times a day (BID) | ORAL | Status: DC
Start: 2022-01-20 — End: 2022-01-23
  Administered 2022-01-20 – 2022-01-23 (×7): 7.5 mg via ORAL
  Filled 2022-01-19 (×7): qty 2

## 2022-01-19 NOTE — PT Progress Note (Signed)
Physical Therapy  Inpatient Rehabilitation Daily Progress Note    Patient Name:  [name]       Medical Record Number: [id number]   Date of Birth: 1956/04/03  Sex: Female          Room/Bed:  M501/M501-02    Therapy Received:  Start Time: 0900   Stop Time: 1000  Total Therapy Minutes: 60    Rehabilitation Precautions/Restrictions:  Weight Bearing Status: (P) no restrictions  Precaution Instructions Given to Patient: (P) Yes  Restricted BP Precautions: other (comment) (ACE wraps, abdominal binder)  Communication Precautions: Speak slowly  Other Precautions: (P) falls, decreased LE strength/sensation, orthostatic hypotension, ABD binder, TED hose and ACE wraps as needed    Rehab Diagnosis: 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 Patient agreeable to PT. Patient with bilateral TED hose, ACE wraps and abdominal binder donned due to tendencies for BP to drop. Today's session focused on weightbearing activity as well as WC mobility. All transfers throughout session completed by way of a low squat pivot with SBA. To warm up, patient completed 5 minutes on the NuStep at level 0 with BUE and BLE.     Next, patient completed 4 sit<>stand with Min A x 1. VC given for a strong anterior head and trunk lean before standing and to flex and extend knees upon standing, as patient has a tendency for bilateral genu recurvatum upon standing. Once in standing, patient ambulated 5 feet with Mod A x 1 for balance. Max VC given to keep feet/glutes under patient as patient has a tendency for increased hip flexion and anterior trunk lean when  ambulating, with feet lag behind body. Patient was followed closely with a WC from second set of hands. Patient completed same sequence as above x 3 but walking further distances (5 feet, 10 feet, and then 10 feet). Throughout the weightbearing activity, patient also mass practiced transferring from standing>sit with a slow descent and anterior trunk lean.     Finally, patient completed 100 feet of WC mobility by way of UE propulsion and  SBA. Min VC to avoid hitting the wall of the hallway. Patient and this Clinical research associate discussed the plan to [place] to another facility (SNF) before home. Patient agrees that she will benefit from therapy every day at another facility to get stronger before heading home for home health PT. All needs met at the completion of the session in bed.       Education Materials engineer, taught by Marquis Buggy at 01/19/2022 10:54 AM.  Learner: Patient  Readiness: Acceptance  Method: Explanation  Response: Verbalizes Understanding    Safety issues and interventions, taught by Marquis Buggy at 01/19/2022 10:54 AM.  Learner: Patient  Readiness: Acceptance  Method: Explanation  Response: Verbalizes Understanding    Rehab techniques/procedure, taught by Marquis Buggy at 01/19/2022 10:54 AM.  Learner: Patient  Readiness: Acceptance  Method: Explanation  Response: Verbalizes Understanding    Precautions, taught by Marquis Buggy at 01/19/2022 10:54 AM.  Learner: Patient  Readiness: Acceptance  Method:  Explanation  Response: Verbalizes Understanding    Plan of care, taught by Marquis BuggyJones, Irie Dowson at 01/19/2022 10:54 AM.  Learner: Patient  Readiness: Acceptance  Method: Explanation  Response: Verbalizes Understanding    Pain management, taught by Marquis BuggyJones, Hennesy Sobalvarro at 01/19/2022 10:54 AM.  Learner: Patient  Readiness: Acceptance  Method: Explanation  Response: Verbalizes Understanding    Home management activities, taught by Marquis BuggyJones, Juleen Sorrels at 01/19/2022 10:54 AM.  Learner:  Patient  Readiness: Acceptance  Method: Explanation  Response: Verbalizes Understanding    Home exercise program, taught by Marquis BuggyJones, Benicio Manna at 01/19/2022 10:54 AM.  Learner: Patient  Readiness: Acceptance  Method: Explanation  Response: Verbalizes Understanding    Functional transfers/mobility, taught by Marquis BuggyJones, Haidyn Kilburg at 01/19/2022 10:54 AM.  Learner: Patient  Readiness: Acceptance  Method: Explanation  Response: Verbalizes Understanding    Fall prevention/balance training, taught by Marquis BuggyJones, Naliya Gish at 01/19/2022 10:54 AM.  Learner: Patient  Readiness: Acceptance  Method: Explanation  Response: Verbalizes Understanding    Equipment, taught by Marquis BuggyJones, Nadav Swindell at 01/19/2022 10:54 AM.  Learner: Patient  Readiness: Acceptance  Method: Explanation  Response: Dollar GeneralVerbalizes Understanding    Community resources, taught by Marquis BuggyJones, Bayler Gehrig at 01/19/2022 10:54 AM.  Learner: Patient  Readiness: Acceptance  Method: Explanation  Response: Verbalizes Understanding    Cognitive function, taught by Marquis BuggyJones, Beila Purdie at 01/19/2022 10:54 AM.  Learner: Patient  Readiness: Acceptance  Method: Explanation  Response: Verbalizes Understanding    ADL retraining, taught by Marquis BuggyJones, Dylann Layne at 01/19/2022 10:54 AM.  Learner: Patient  Readiness: Acceptance  Method: Explanation  Response: Verbalizes Understanding    Education Comments  No comments found.         Assessment & Plan   Patient with further weightbearing activity today, ambulating a total of approximately 30 feet, with seated rest breaks throughout. Plan to continue building endurance to weightbearing activity, as tolerance allows. Pt in agreement that she will benefit from staying at another facility to increase strength and endurance prior to returning home. Patient will benefit from skilled PT to address these deficits and decrease BOC upon [place].    Plan:  Risks/Benefits/POC Discussed with Pt/Family: With patient  Treatment/Interventions: Gait training, Exercise, Stair training, Neuromuscular  re-education, Functional transfer training, LE strengthening/ROM, Endurance training, Patient/family training, Equipment eval/education, Bed mobility, Compensatory technique education, Continued evaluation    Recommendation:  Discharge Recommendation: Home with home health PT, SNF (will continue to assess)  PT Therapy Recommendation: 5-6 days/week, 60-120 mins/day, 1:1 treatment  PT Estimated Length of Stay: 10-14 days from IE on 01/04/2022

## 2022-01-19 NOTE — Plan of Care (Signed)
Problem: Bladder Management  Goal: LTG: Pt will be 100% continent and independent with bladder elim at [place]  Outcome: Progressing  Patient is retaining urine, unable to void, need intermittent cath q 4hourly to empty bladder.     Problem: Cardiac Function  Goal: LTG: Pt will be independent with BP and pulse management and medication at [place]  Outcome: Progressing  Patient verbalize how to monitor blood pressure and take medication on time to manage blood pressure and tachycardia.     Problem: Pain Management  Goal: LTG: Pt will achieve good pain control with current medication regimen as evidenced by ability to participate in 100% of therapy sessions.  Outcome: Progressing  Complaint pain in back, manage pain with prn dilaudid , medicated one time during night shift.     Problem: Safety Risk  Goal: LTG: Pt will recognize limitations and use call bell 100% of the time prior to transfers in order to prevent fsalls  Outcome: Progressing  Called appropriately as needed for assistance, did not try out of bed by self.     Problem: Skin Wound Management  Goal: LTG: Excoriation at buttocks will resolve by [place]  Outcome: Progressing  Excoriation at buttocks, between thigh, groin area almost healed, encourage patient keep that area dry and open to air at least 30 minutes 4-5 times

## 2022-01-19 NOTE — OT Progress Note (Signed)
Occupational Therapy  Inpatient Rehabilitation Weekly Progress Note    Patient Name:  [name]       Medical Record Number: [id number]   Date of Birth: 1956-07-08  Sex: Female          Room/Bed:  M501/M501-02    Therapy Received:  Start Time: 0800   Stop Time: 0900  Total Therapy Minutes: 60    Rehabilitation Precautions/Restrictions:  Weight Bearing Status: no restrictions  Precaution Instructions Given to Patient: Yes  Restricted BP Precautions: other (comment) (ACE wraps, abdominal binder)  Communication Precautions: Speak slowly  Other Precautions: falls, decreased LE strength/sensation, orthostatic hypotension, ABD binder, TED hose and ACE wraps as needed    Rehab Diagnosis: 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 She completed dressing at bed level with set-upA. Patient reported that she re-donned ACE wraps herself following education on process yesterday. Educated on importance of removing TED hose at night to promote skin integrity. Other CARE Tool scores based on patient's usual performance. Donned abdominal binder in prep for upright activities.     Transported to PT gym where she practiced standing tolerance/balance tasks using // bars.     She began by completed STS with focus on standing >30s x2; patient reporting lightheadedness and reported that she needed to sit immediately with severe retropulsion and stiff BLE and trunk extension. Patient educated on importance of remaining calm during these times to decrease fall risk and maintain safety for her and the person assisting her. Practiced  additional stands simulated pant management to practice unilateral UE support with up to mod A for decreasing retropulsion and knee hyperextension. She was able to communicate that she needed to sit, and was able to complete slow, controlled descent with heavy multimodal cues for anterior lean prior to sitting.    Self Care Functional Status:   Current Status Current Status Discharge Goal   Functional Area: Care Score: Comments:    Eating 4 SPV to ensure adequate intake Independent   Oral Hygiene 5 Wheelchair level Independent   Toileting Hygiene 3 Grabbars for support, assistance with balance and pant management. Able to complete pericare in seated position Independent   Shower/Bathe Self 3 May recieve assistance for overall thoroughness Supervision or touching assistance   Upper Body Dressing 5    Independent   Lower Body Dressing 5 Bed level, rolling to advance over hips Supervision or touching assistance   Putting On/Taking Off Footwear 3 Bed level using tailor sit; assistance with TED hose Supervision or touching assistance     Mobility Functional Status:   Current   Status Current Status Discharge Goal   Functional Area: Care Score: Comments:    Toilet Transfer 3 SPT from wheelchair<>toilet; intermittnet assistance for slow, controlled descent Independent   Picking Up Object 88    Independent (with use of AE)     Encounter Problems       Encounter Problems (Active)       OT General       LTG: Pt will complete LB  dressing with use of compensatory strategies and AE prn with spv.  (Progressing)       Start:  01/04/22            STG: Pt will complete LB dressing with mod A with use of compensatory strategies and AE prn with spv.  (Completed)       Start:  01/04/22    Expected End:  01/11/22    Resolved:  01/11/22         LTG: Pt will increase dynamic standing balance and upright tolerance to allow pt to advance pants over hips without physical assistance in standing.  (Progressing)       Start:  01/04/22             LTG: Pt will complete LB bathing with use of compensatory strategies and AE prn with spv.  (Progressing)       Start:  01/04/22            LTG: Pt will complete tub transfers with use of recommended DME and AE prn with spv.  (Progressing)       Start:  01/04/22            LTG: Pt will perform UE HEP with occasional verbal cues.  (Progressing)       Start:  01/04/22            STG: Pt will increase dynamic standing balance to fair +.  (Progressing)       Start:  01/04/22                   Education Documentation  Safety issues and interventions, taught by Davonna Belling, OT at 01/19/2022  3:51 PM.  Learner: Patient  Readiness: Acceptance  Method: Explanation  Response: [name] Understanding, Demonstrated Understanding, Needs Reinforcement    Rehab techniques/procedure, taught by Davonna Belling, OT at 01/19/2022  3:51 PM.  Learner: Patient  Readiness: Acceptance  Method: Explanation  Response: Verbalizes Understanding, Demonstrated Understanding, Needs Reinforcement    Precautions, taught by Davonna Belling, OT at 01/19/2022  3:51 PM.  Learner: Patient  Readiness: Acceptance  Method: Explanation  Response: Verbalizes Understanding, Demonstrated Understanding, Needs Reinforcement    Functional transfers/mobility, taught by Davonna Belling, OT at 01/19/2022  3:51 PM.  Learner: Patient  Readiness: Acceptance  Method: Explanation  Response: Verbalizes Understanding, Demonstrated Understanding, Needs Reinforcement    Fall prevention/balance training, taught by Davonna Belling, OT at 01/19/2022  3:51 PM.  Learner: Patient  Readiness: Acceptance  Method: Explanation  Response: Verbalizes Understanding, Demonstrated Understanding, Needs Reinforcement    ADL retraining, taught by Davonna Belling, OT at 01/19/2022  3:51 PM.  Learner: Patient  Readiness: Acceptance  Method: Explanation  Response: Verbalizes Understanding, Demonstrated Understanding, Needs Reinforcement    Education Comments  No comments  found.        Assessment & Plan   Patient is making slow progress during her stay which has been exacerbated by hemodynamic instability, anxiety, and severe deconditioning. Discussed transition to SNF to maximize continued strengthening and conditioning in prep for d/c home. May benefit from co-tx with neuropsych to maximize participation and engagement in therapy session while managing emotions and psychosocial barriers.    Plan:  Risks/Benefits/POC Discussed with Pt/Family: With patient  Treatment Interventions: ADL retraining, Functional transfer training, UE strengthening/ROM, Endurance training, Cognitive reorientation, Patient/Family training, Equipment eval/education, Neuro muscular reeducation, Fine motor coordination activities, Compensatory technique education    Recommendation:  Discharge Recommendation: Home with home health OT, Home with  home health PT  OT Therapy Recommendation: 5-6 days/week, 60-120 mins/day, 1:1 treatment, Group therapy  OT Estimated Length of Stay: 14 days    Groups appropriate for patient:   UE Group, Mobility Group    Group Justification: Increase aerobic endurance capacity utilizing peer support  Improve safety with use of appropriate AD while dividing attention through encouragement by peers  Improve negotiation of environmental barrier in distracting environment  Improve coping and therapeutic participation with support by peers  Experience modeling by peers  Carryover skills learned in individual sessions in group format  Emotional and/or spiritual support from peers experiencing similar circumstances    Encouraging receiving and providing feedback to peers for enhanced skill development

## 2022-01-19 NOTE — Progress Notes (Signed)
Call placed to patient's daughter, Bella Kennedy to discuss discharge and recommendation for SNF.    Message left requesting call.

## 2022-01-19 NOTE — Progress Notes (Signed)
MEDICINE PROGRESS NOTE  [facility], DIVISION OF HOSPITALIST MEDICINE   [facility]   [place] Pager: [phone]      Date Time: 01/19/22 4:16 PM  Patient Name: [name]  Attending Physician: Anastasia Pall, E[facility]on [facility]  Assessment:   66 year old female with past medical history significant for diabetes, hypertension, nonischemic cardiomyopathy, paroxysmal A-fib, anemia, bipolar, TIAs presented to [facility]  hospital on 2/7 with lower extremity weakness, paresthesias, transaminitis thought to have 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 Patient was discharged on 2/17 and went back to the hospital on 2/18 for continued management of rhabdomyolysis.  Patient noted to have left-sided weakness with associated difficulty with walking, standing, also developing bilateral hand and feet paresthesias and hand weakness,she was seen by neurology, work-up including MRIs, lumbar puncture done with no clear etiology,was transferred to [facility] for consideration for IVIG.  While at [facility], first dose of IVIG was started,but apparently,she developed allergic reaction therefore IVIG was discontinued. Diagnosis at the time of discharge stayed unclear,DDx was possible statin induced myositis vs. inflammatory myositis vs. immune mediated myositis.  She was also noted to have multiple vitamin deficiencies including D, zinc, B6, iron deficiency which are being replaced.  Patient was discharged to acute rehab on 3/14.    Recommendations:   Myositis: Unclear etiology including possible statin induced myositis versus inflammatory arthritis versus immune mediated myositis  -Attempted IVIG at [facility] unsuccessful due to development of allergic reaction  -Patient's access was removed - as the initial plan to plasma exchange was cancelled. 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 role of biopsy?    Hyponatremia,resolved likely hypovolemia based on low urine Na and urine osmolality   -Sodium stable including a value of 135 on 3/27  -monitor BMP periodically     Improved metabolic acidosis  -HCO3 as low as 12 on 3/22 and improved at this time including labs 3/27    Vitamin D, B6, B1, zinc deficiencies  -Continue multivitamin supplement  -Continue thiamine(received IV replacement 3/4-3/9), vitamin B6, zinc supplements     Normocytic anemia  -Noted TSAT of 21%, ferritin 492, normal folate level on 3/10;  -normal vitamin B12 level on 3/4  -cont ferrous sulfate  -Hgb has been stable     Moderate protein calorie malnutrition  -Weight loss, was on Ozempic but discontinued  -c/w glucerna and regular diet    Paroxysmal A-fib  -currently in sinus with intermittent tachycardia  -cont metoprolol XL 125 mg in evening (increased from 100 mg on 3/17)  -Continue Eliquis  -Follow heart rate    Orthostatic hypotension  -Responding to midodrine 5 mg in am and afternoon for now (continuing metoprolol at night with tachycardia and PAF)  -c/w compression stocking and abd binder  -Initially on Flomax; may consider discontinuing if significant orthostatic hypotension     Urinary retension  -Had Foley catheter, failed voiding trial on 3/13   -Monitor with voiding trial status post removal of Foley catheter 3/27 and monitor with voiding trial  -Continue Flomax for now, follow BP  -Bladder scan and straight cath PRN    COPD--Not in exacerbation   -continue Symbicort  -prn duonebs  -Chest x-ray was done 3/21-no acute changes    History of  nonischemic cardiopathy  -Echo 12/31/2021 with EF 43%, mild global hypokinesis  -Noted per cardiology note 3/13 with no further plans for ischemic work-up as patient had cardiac catheterization in  2022 with no obstructive CAD  -outpatient follow-up for nonischemic cardiomyopathy work-up  -Repeat echo as outpatient     Bipolar disorder  -Continue home meds including Seroquel, ropinirole; home latuda 120mg  daily(pt has home med)     Diabetes  -Hemoglobin A1C 5.3% on 3/6  -Patient was on Ozempic but stopped due to malnutrition/weight loss  -No need for sliding-scale     H/o GERD  -c/w PPI    Constipation  -Continue bowel regimen    DVT ppx:on Eliquis as above    Discussed with patient and nurse    Subjective/ROS/24hr events:     CC: Myositis    HPI: Patient seen and examined.  No events overnight.  She states she still unable to urinate spontaneously.  Denies any chest pain or shortness of breath.  No nausea or vomiting.  No fevers.      Physical Exam:     Temp:  [98 F (36.7 C)-98.4 F (36.9 C)] 98.4 F (36.9 C)  Heart Rate:  [96-111] 105  Resp Rate:  [18] 18  BP: (87-118)/(54-73) 109/67    Intake/Output Summary (Last 24 hours) at 01/19/2022 1616  Last data filed at 01/19/2022 0900  Gross per 24 hour   Intake 480 ml   Output 2090 ml   Net -1610 ml      General: alert and awake, no distress  HEENT: anicteric, moist mucous membranes  Neck: supple, no JVD  CVS: nl S1, S2; no gallop or rub  Lungs: clear to auscultation b/l, no wheezing or rhonchi; normal resp effort  Abd: soft, non-tender, non-distended  Ext: no pedal edema, no cyanosis  Neuro: alert and awake, moving all extremities with overall weakness in LE especially        Meds:   Medications were reviewed:  Scheduled Meds:  Current Facility-Administered Medications   Medication Dose Route Frequency    apixaban  5 mg Oral Q12H SCH    budesonide-formoterol  2 puff Inhalation BID    ferrous sulfate  324 mg Oral QAM W/BREAKFAST    lidocaine  1 patch Transdermal Q24H    lurasidone  120 mg Oral QPM    metoprolol succinate  125 mg Oral QHS    midodrine  5 mg Oral BID Meals    pantoprazole  40 mg Oral QAM AC    polyethylene glycol  17 g Oral Daily    QUEtiapine   100 mg Oral QHS    rOPINIRole  1 mg Oral QHS    senna-docusate  2 tablet Oral QHS    tamsulosin  0.4 mg Oral Daily after dinner    thiamine  100 mg Oral Daily    vitamin B-6  50 mg Oral Daily    vitamin D  25 mcg Oral Daily    vitamins/minerals  1 tablet Oral Daily at 1700    zinc sulfate  220 mg Oral Daily at 1700     Continuous Infusions:  PRN Meds:.acetaminophen, albuterol-ipratropium, bisacodyl, bisacodyl, diphenhydrAMINE, gabapentin, HYDROmorphone, lactulose, LORazepam, melatonin, naloxone, promethazine  Labs/Radiology:   Imaging personally reviewed, including: all available   No results found.  No results for input(s): GLUCOSEWB in the last 24 hours.  Recent Labs   Lab 01/17/22  0643 01/15/22  0531 01/13/22  0436 01/12/22  1759   Sodium 135 134* 133*  130*   Potassium 4.2 5.0 4.4 5.3   Chloride 108 106 106 106   BUN 6.0* 7.0 8.0 8.0   Creatinine 0.6 0.7 0.7 0.8   EGFR >60.0 >60.0 >60.0 >60.0   Glucose 103* 99 92 117*   Calcium 9.0 9.2 9.2 9.5       Recent Labs   Lab 01/17/22  0643 01/13/22  0436 01/12/22  1759   WBC 7.03 8.81 10.14*   Hgb 8.6* 8.9* 10.0*   Hematocrit 26.8* 27.8* 31.6*   Platelets 378* 433* 333           Signed by: Louann Liv, DO

## 2022-01-19 NOTE — SLP Progress Note (Addendum)
Speech Language Pathology  Inpatient Rehabilitation Daily Progress Note    Patient Name:  [name]       Medical Record Number: [id number]   Date of Birth: [birth year]  Sex: Female          Room/Bed:  M501/M501-02    Therapy Received:  Start Time: 1100   Stop Time: 1200  Total Therapy Minutes: 60    Rehabilitation Precautions/Restrictions:  Weight Bearing Status: no restrictions  Precaution Instructions Given to Patient: (P) Yes  Restricted BP Precautions: other (comment) (ACE wraps, abdominal binder)  Communication Precautions: Speak slowly  Other Precautions: falls, decreased LE strength/sensation, orthostatic hypotension, ABD binder, TED hose and ACE wraps as needed    Rehab Diagnosis: 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 3 massed practice trials; 3 trials with single verbal corrective feedback cues; and remainder 2 trials without cues to fully acquire 4-step procedure. Noted attention and working memory deficits playing into developing efficient learning curve, with pt able to retain 3 steps, but after 2-3 trials commission errors noted primarily on recalling purpose of alarm.  Medication management: Of note, pt recalled purposes of all medications except 2 in prior SLP session denoting familiarity with her regimen. Pillbox setup and modeling of think aloud to introduce metacognitive strategy during this simulated IADL. Pt able to  execute strategy in  trials post modeling, 4/4 with reminder. 75% accuracy, 100% with cue to recheck. Post-task assessment notable for pt expressing value in using think aloud to promote attention during task ("I can hear myself"), prompted by mechanism of drawing pt's attention via auditory-visual activation.    All needs addressed prior to exiting room.    Education Documentation  Compensatory cognitive strategies/aids, taught by Barbara Cower, SLP at 01/19/2022 12:57 PM.  Learner: Patient  Readiness: Acceptance  Method: Explanation, Demonstration  Response: Verbalizes Understanding, Demonstrated Understanding, Needs Reinforcement    Education Comments  No comments found.        Assessment & Plan   Pt stimulable for think aloud technique but requires caregiver S to consistently execute across task due to attention and working memory breakdowns impairing self-monitoring. Phone alarms set as prospective recall aid to trigger fluid intake Barrier to discharge is cognitive concurrently with physical deconditioning and ongoing co-morbidities such as low BP, anxiety, and limited PO intake along with incontinence and constipation that significantly increase caregiver burden. SNF discharge appropriate if caregiver is unable to provide required degree of assist with basic ADLs and S for IADLs.    Plan:  Risks/Benefits/POC Discussed with Pt/Family: With patient  Treatment/Interventions: Speech/Language treatment, Cognitive linguistic retraining    Recommendation:  Discharge Recommendation: Home with supervision  SLP Therapy Recommendation: 5-6 days/week, 60-120 mins/day, 1:1 treatment  SLP Estimated Length of Stay: March 4    Swallow Recommendations:   Diet Solids Recommendation: regular  Diet Liquids Recommendations: thin consistency  Precautions/Compensations: Awake/alert, Upright 90  degrees for all oral intake  Recommended Form of Meds: PO, whole, with liquid

## 2022-01-19 NOTE — Plan of Care (Signed)
Problem: Bladder Management  Goal: LTG: Pt will be 100% continent and independent with bladder elim at [place]  Outcome: Progressing  Pt is retaining; in and out straight cath is done per order      Problem: Cardiac Function  Goal: LTG: Pt will be independent with BP and pulse management and medication at [place]  Outcome: Progressing  Pt is on tele monitor, she has high HR and low BP this morning, Midodrine is given per order, ABD binder, ted hos applied       Problem: Pain Management  Goal: LTG: Pt will achieve good pain control with current medication regimen as evidenced by ability to participate in 100% of therapy sessions.  Outcome: Progressing:   Pt is given 2 mg dilaudid q6 per order for pain but has been refusing her lidocaine patch: verbalizing that pain meds helps her to get through the day   Problem: Safety Risk  Goal: LTG: Pt will recognize limitations and use call bell 100% of the time prior to transfers in order to prevent fsalls  Outcome: Progressing  Pt able to call appropriately for her needs      Problem: Skin Wound Management  Goal: LTG: Excoriation at buttocks will resolve by [place]  Outcome: Progressing  Excoriation on the buttock looks resolved

## 2022-01-20 DIAGNOSIS — E871 Hypo-osmolality and hyponatremia: Secondary | ICD-10-CM

## 2022-01-20 LAB — CBC
Absolute NRBC: 0 10*3/uL (ref 0.00–0.00)
Hematocrit: 25.5 % — ABNORMAL LOW (ref 34.7–43.7)
Hgb: 8.1 g/dL — ABNORMAL LOW (ref 11.4–14.8)
MCH: 25.9 pg (ref 25.1–33.5)
MCHC: 31.8 g/dL (ref 31.5–35.8)
MCV: 81.5 fL (ref 78.0–96.0)
MPV: 8.7 fL — ABNORMAL LOW (ref 8.9–12.5)
Nucleated RBC: 0 /100 WBC (ref 0.0–0.0)
Platelets: 339 10*3/uL (ref 142–346)
RBC: 3.13 10*6/uL — ABNORMAL LOW (ref 3.90–5.10)
RDW: 18 % — ABNORMAL HIGH (ref 11–15)
WBC: 7.13 10*3/uL (ref 3.10–9.50)

## 2022-01-20 LAB — BASIC METABOLIC PANEL
Anion Gap: 6 (ref 5.0–15.0)
BUN: 3 mg/dL — ABNORMAL LOW (ref 7.0–21.0)
CO2: 25 mEq/L (ref 17–29)
Calcium: 8.5 mg/dL (ref 8.5–10.5)
Chloride: 106 mEq/L (ref 99–111)
Creatinine: 0.5 mg/dL (ref 0.4–1.0)
Glucose: 96 mg/dL (ref 70–100)
Potassium: 3.6 mEq/L (ref 3.5–5.3)
Sodium: 137 mEq/L (ref 135–145)

## 2022-01-20 LAB — GFR: EGFR: >60

## 2022-01-20 NOTE — Progress Notes (Signed)
Screening ID # : [id number]  The following forms were included in the submission:   UAI   DMAS-96   DMAS-95RC   DMAS-97

## 2022-01-20 NOTE — Plan of Care (Signed)
Problem: Bladder Management  Goal: LTG: Pt will be 100% continent and independent with bladder elim at [place]  Outcome: Progressing   Foley D/C, bladder scan completed as ordered and catheterized x2.   Problem: Pain Management  Goal: LTG: Pt will achieve good pain control with current medication regimen as evidenced by ability to participate in 100% of therapy sessions.  Outcome: Progressing   PRN dilaudid administered for c/o lower back pain and effective.  Problem: Safety Risk  Goal: LTG: Pt will recognize limitations and use call bell 100% of the time prior to transfers in order to prevent fsalls  Outcome: Progressing   Patient has good safety awareness and able to call for assistance with transfers. Following all fall precautions in place.

## 2022-01-20 NOTE — OT Progress Note (Signed)
Occupational Therapy  Inpatient Rehabilitation Daily Progress Note    Patient Name:  [name]       Medical Record Number: [id number]   Date of Birth: May 03, 1956  Sex: Female          Room/Bed:  M501/M501-02    Therapy Received:  Start Time: 1400   Stop Time: 1435  Total Therapy Minutes: 35    Rehabilitation Precautions/Restrictions:  Weight Bearing Status: no restrictions  Precaution Instructions Given to Patient: Yes  Restricted BP Precautions: other (comment) (ACE wraps, abdominal binder)  Communication Precautions: Speak slowly  Other Precautions: falls, decreased LE strength/sensation, orthostatic hypotension, ABD binder, TED hose and ACE wraps as needed    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 She cited that she feels, "out of control," and, "fearful of change." Educated on role of SNF, POC, and goals for d/c. Spent time discussing earlier traumas that caused her fears of change with patient opening op about her early life and how it has affected her mindset today.     She enjoyed discussing her faith and her children. She cited that delving into her faith has gotten her out of feeling hopeless in previous situations. She reported that she is still reading the Bible and attending her bible studies. Provided patient a physical copy of Proverbs and daily devotionals. Patient appreciative and reported that she plans on participating in therapy tomorrow. Will follow up with RN to request chaplain services.     Assessment & Plan    Session limited by patient's anxiety and fear after finding out that she will be transitioning to SNF. She was receptive and appreciative of discussion. Tomorrow's session will be a co-tx with neuropsych to maximize participation and coping.    Plan:  Risks/Benefits/POC Discussed with Pt/Family: With patient  Treatment Interventions: ADL retraining, Functional transfer training, UE strengthening/ROM, Endurance training, Cognitive reorientation, Patient/Family training, Equipment eval/education, Neuro muscular reeducation, Fine motor coordination activities, Compensatory technique education    Recommendation:  Discharge Recommendation: Home with home health OT, Home with home health PT  OT Therapy Recommendation: 5-6 days/week, 60-120 mins/day, 1:1 treatment, Group therapy  OT Estimated Length of Stay: 14 days

## 2022-01-20 NOTE — PT Progress Note (Signed)
Inpatient Rehabilitation Exception Note    [name] missed therapy today due to patient refusal. See below for details.      Missed therapy minutes reason: (P) Patient refusal - other (Pt refused therapy this hour. She states she needs rest and is feeling overwhelmed. Pt strongly encouraged to particpite to maintain progress made in the last few days. Education given on the importance to get OOB for BP, strength and overall wellness.)  Plan to make up missed minutes: (P) Later today, scheduled

## 2022-01-20 NOTE — Progress Notes (Signed)
MEDICINE PROGRESS NOTE  [facility], DIVISION OF HOSPITALIST MEDICINE   [facility]   [place] Pager: [phone]      Date Time: 01/20/22 1:56 PM  Patient Name: [name]  Attending Physician: Anastasia Pall, [facility] 18  Assessment:   66 year old female with past medical history significant for diabetes, hypertension, nonischemic cardiomyopathy, paroxysmal A-fib, anemia, bipolar, TIAs presented to [facility]  hospital on 2/7 with lower extremity weakness, paresthesias, transaminitis thought to have 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 Patient was discharged on 2/17 and went back to the hospital on 2/18 for continued management of rhabdomyolysis.  Patient noted to have left-sided weakness with associated difficulty with walking, standing, also developing bilateral hand and feet paresthesias and hand weakness,she was seen by neurology, work-up including MRIs, lumbar puncture done with no clear etiology,was transferred to [facility] for consideration for IVIG.  While at [facility], first dose of IVIG was started,but apparently,she developed allergic reaction therefore IVIG was discontinued. Diagnosis at the time of discharge stayed unclear,DDx was possible statin induced myositis vs. inflammatory myositis vs. immune mediated myositis.  She was also noted to have multiple vitamin deficiencies including D, zinc, B6, iron deficiency which are being replaced.  Patient was discharged to acute rehab on 3/14.    Recommendations:   Myositis: Unclear etiology including possible statin induced myositis versus inflammatory arthritis versus immune mediated myositis  -Attempted IVIG at [facility] unsuccessful due to development of allergic reaction  -Patient's access was removed - as the initial plan to plasma exchange was cancelled. 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 and less likely to suggest autoimmune process   -Multiple vitamin  deficiencies including vitamin D, zinc, B6, iron which are repleted  -Ongoing PT/OT per rehab protocol  -Avoid statins  -Negative paraneoplastic autoantibody from 3/7    Hyponatremia, resolved likely hypovolemia based on low urine Na and urine osmolality   -Sodium stable including a value of 137 on 3/29  -monitor BMP periodically     Improved metabolic acidosis  -HCO3 as low as 12 on 3/22 and improved at this time including labs 3/27    Vitamin D, B6, B1, zinc deficiencies  -Continue multivitamin supplement  -Continue thiamine(received IV replacement 3/4-3/9), vitamin B6, zinc supplements     Normocytic anemia  -Noted TSAT of 21%, ferritin 492, normal folate level on 3/10;  -normal vitamin B12 level on 3/4  -cont ferrous sulfate  -Hgb has been stable     Moderate protein calorie malnutrition  -Weight loss, was on Ozempic but discontinued  -c/w glucerna and regular diet    Paroxysmal A-fib  -currently in sinus with intermittent tachycardia  -cont metoprolol XL 125 mg in evening (increased from 100 mg on 3/17)  -Continue Eliquis  -Follow heart rate    Orthostatic hypotension  -Responding to midodrine 5 mg in am and afternoon for now (continuing metoprolol at night with tachycardia and PAF)  -c/w compression stocking and abd binder  -Initially on Flomax; may consider discontinuing if significant orthostatic hypotension     Urinary retension  -Had Foley catheter, failed voiding trial on 3/13   -Monitor with voiding trial status post removal of Foley catheter 3/27 and monitor with voiding trial  -Continue Flomax for now, follow BP  -Bladder scan and straight cath PRN    COPD--Not in exacerbation   -continue Symbicort  -prn duonebs  -Chest x-ray was done 3/21-no acute changes    History of nonischemic cardiopathy  -Echo 12/31/2021 with  EF 43%, mild global hypokinesis  -Noted per cardiology note 3/13 with no further plans for ischemic work-up as patient had cardiac catheterization in 2022 with no obstructive  CAD  -outpatient follow-up for nonischemic cardiomyopathy work-up  -Repeat echo as outpatient     Bipolar disorder  -Continue home meds including Seroquel, ropinirole; home latuda 120mg  daily(pt has home med)     Diabetes  -Hemoglobin A1C 5.3% on 3/6  -Patient was on Ozempic but stopped due to malnutrition/weight loss  -No need for sliding-scale   -Continue diet control    H/o GERD  -c/w PPI    Constipation  -Continue bowel regimen    DVT ppx:on Eliquis as above    Discussed with patient and nurse    Subjective/ROS/24hr events:     CC: Myositis    HPI: Patient seen and examined.  No events overnight.  She states she still unable to urinate spontaneously.  Denies any chest pain or shortness of breath.  No nausea or vomiting.  No fevers.      Physical Exam:     Temp:  [98.1 F (36.7 C)-99 F (37.2 C)] 98.1 F (36.7 C)  Heart Rate:  [100-114] 100  Resp Rate:  [18] 18  BP: (97-130)/(57-78) 130/78    Intake/Output Summary (Last 24 hours) at 01/20/2022 1356  Last data filed at 01/20/2022 0809  Gross per 24 hour   Intake 580 ml   Output 1000 ml   Net -420 ml      General: alert and awake, no distress  HEENT: anicteric, moist mucous membranes  Neck: supple, no JVD  CVS: nl S1, S2; no gallop or rub  Lungs: clear to auscultation b/l, no wheezing or rhonchi; normal resp effort  Abd: soft, non-tender, non-distended  Ext: no pedal edema, no cyanosis  Neuro: alert and awake, moving all extremities with overall weakness in LE especially        Meds:   Medications were reviewed:  Scheduled Meds:  Current Facility-Administered Medications   Medication Dose Route Frequency    apixaban  5 mg Oral Q12H SCH    budesonide-formoterol  2 puff Inhalation BID    ferrous sulfate  324 mg Oral QAM W/BREAKFAST    lidocaine  1 patch Transdermal Q24H    lurasidone  120 mg Oral QPM    metoprolol succinate  125 mg Oral QHS    midodrine  7.5 mg Oral BID Meals    pantoprazole  40 mg Oral QAM AC    polyethylene glycol  17 g Oral Daily    QUEtiapine   100 mg Oral QHS    rOPINIRole  1 mg Oral QHS    senna-docusate  2 tablet Oral QHS    tamsulosin  0.4 mg Oral Daily after dinner    thiamine  100 mg Oral Daily    vitamin B-6  50 mg Oral Daily    vitamin D  25 mcg Oral Daily    vitamins/minerals  1 tablet Oral Daily at 1700    zinc sulfate  220 mg Oral Daily at 1700     Continuous Infusions:  PRN Meds:.acetaminophen, albuterol-ipratropium, bisacodyl, bisacodyl, diphenhydrAMINE, gabapentin, HYDROmorphone, lactulose, LORazepam, melatonin, naloxone, promethazine  Labs/Radiology:   Imaging personally reviewed, including: all available   No results found.  No results for input(s): GLUCOSEWB in the last 24 hours.  Recent Labs   Lab 01/20/22  0658 01/17/22  0643 01/15/22  0531   Sodium 137 135 134*   Potassium 3.6 4.2  5.0   Chloride 106 108 106   BUN 3.0* 6.0* 7.0   Creatinine 0.5 0.6 0.7   EGFR >60.0 >60.0 >60.0   Glucose 96 103* 99   Calcium 8.5 9.0 9.2       Recent Labs   Lab 01/20/22  0658 01/17/22  0643   WBC 7.13 7.03   Hgb 8.1* 8.6*   Hematocrit 25.5* 26.8*   Platelets 339 378*           Signed by: Louann Liv, DO

## 2022-01-20 NOTE — Progress Notes (Signed)
PHYSICAL MEDICINE AND REHABILITATION  PROGRESS NOTE  FACE TO FACE ENCOUNTER    Date Time: 01/20/22 6:00 PM    Patient Name: [name],[name]  Admission date:  01/03/2022  (LOS: 17 days)    Subjective:     Patient has no new complaints. Denies pain. Tolerating therapies.     Functional Status:     Pt demonstrating some self-limiting behaviors that would impede her ability to make sustainable traction with mobility goals and consistent uptake of cognitive strategies. Discharge to SNF still appropriate based on pt presentation and EHR notes outlining discussions between CM and pt's daughter.    Medications:   Medication reviewed by me:     Scheduled Meds: PRN Meds:    apixaban, 5 mg, Oral, Q12H SCH  budesonide-formoterol, 2 puff, Inhalation, BID  ferrous sulfate, 324 mg, Oral, QAM W/BREAKFAST  lidocaine, 1 patch, Transdermal, Q24H  lurasidone, 120 mg, Oral, QPM  metoprolol succinate, 125 mg, Oral, QHS  midodrine, 7.5 mg, Oral, BID Meals  pantoprazole, 40 mg, Oral, QAM AC  polyethylene glycol, 17 g, Oral, Daily  QUEtiapine, 100 mg, Oral, QHS  rOPINIRole, 1 mg, Oral, QHS  senna-docusate, 2 tablet, Oral, QHS  tamsulosin, 0.4 mg, Oral, Daily after dinner  thiamine, 100 mg, Oral, Daily  vitamin B-6, 50 mg, Oral, Daily  vitamin D, 25 mcg, Oral, Daily  vitamins/minerals, 1 tablet, Oral, Daily at 1700  zinc sulfate, 220 mg, Oral, Daily at 1700        Continuous Infusions:   acetaminophen, 650 mg, Q4H PRN  albuterol-ipratropium, 3 mL, Q6H PRN  bisacodyl, 10 mg, Daily PRN  bisacodyl, 10 mg, Daily PRN  diphenhydrAMINE, 25 mg, Q6H PRN  gabapentin, 100 mg, TID PRN  HYDROmorphone, 2 mg, Q6H PRN  lactulose, 20 g, Q4H PRN  LORazepam, 0.5 mg, QHS PRN  melatonin, 3 mg, QHS PRN  naloxone, 0.4 mg, PRN  promethazine, 25 mg, Q6H PRN          Medication Review  A complete drug regimen review was completed: Yes  Were drug issues were found during review: No   If yes to drug issues found during review:  What was the issue?:   What was the time the  issue was identified?:   Was I contacted and action was taken by midnight of the next calendar day once issue was identified?: N/A   Person who contacted me:   Action taken:     Review of Systems:   A comprehensive review of systems was: No fevers, chills, nausea, vomiting, chest pain, shortness of breath, cough, headache, double vision.  All others negative.    Labs:     Recent Labs   Lab 01/20/22  0658 01/17/22  0643   WBC 7.13 7.03   Hgb 8.1* 8.6*   Hematocrit 25.5* 26.8*   Platelets 339 378*      Recent Labs   Lab 01/20/22  0658 01/17/22  0643 01/15/22  0531   Sodium 137 135 134*   Potassium 3.6 4.2 5.0   Chloride 106 108 106   CO2 25 21 18    BUN 3.0* 6.0* 7.0   Creatinine 0.5 0.6 0.7   Calcium 8.5 9.0 9.2   Glucose 96 103* 99         Rads:   Radiological Procedure reviewed.  Radiology Results (24 Hour)       ** No results found for the last 24 hours. **          Physical Exam:  Vitals:    01/20/22 0510 01/20/22 0809 01/20/22 1122 01/20/22 1633   BP: 109/68 97/65 130/78 106/67   Pulse: 100   (!) 110   Resp:  18  18   Temp: 98.1 F (36.7 C)   98.4 F (36.9 C)   TempSrc: Oral   Oral   SpO2: 100% 100%  99%   Weight:       Height:           Intake and Output Summary (Last 24 hours) at Date Time    Intake/Output Summary (Last 24 hours) at 01/20/2022 1800  Last data filed at 01/20/2022 0809  Gross per 24 hour   Intake 340 ml   Output 1000 ml   Net -660 ml     P.O.: 120 mL (01/20/22 0809)     Urine: 0 mL (01/20/22 0809)  Bladder Scan Volume (mL): 450 mL (01/20/22 0510)  Intermittent/Straight Cath (mL): 500 mL (01/19/22 2347)       Gen: Alert, NAD  Psych: Pleasant and cooperative  Skin: Pink, warm, no rashes  HEENT: Atraumatic, normocephalic,   Chest: symmetric bilateral chest rise and fall,   CV: Regular rate and rhythm, no murmurs  Resp: breath sounds equal throughout; non labored  Abd: non-distended, soft nontender  Extr: Pink, warm,  Neuro:  Fluent speech    Assessment and Plan:     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   Occupational Therapy focused on improving strength, endurance, transfers, activities of daily living, and cognition for 60-120 mins/day, 5-6 days/week, for 14 days.     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%   Metoprolol,   losartan held due to hypotension     # COPD  Symbicort      # bipolar disorder  Latuda   Seroquel     # Pain Management   Daily monitoring   gabapentin  Acetaminophen, dilaudid PRN     #Bowel Management  constipation - Senna, Colace, Bisacodyl suppository PRN; hold for loose stools    #Bladder Management  Check PVRs    #Skin Management  Frequent skin assessment  Moisture barrier PRN    #FEN/GI with severe malnutrition   cardiac diet    #DVT PPx  eliquis    #Dispo  Pending rehab progress      Continue comprehensive and intensive inpatient rehab program, including:   Physical therapy 60-120 min daily, 5-6 times per week, Occupational therapy 60-120 min daily, 5-6 times per week, Case management and Rehabilitation nursing.  Psychology services to evaluate and treat as needed     Signed by: Cheryll Dessert, MD   01/20/2022, 6:00 PM    [facility]

## 2022-01-20 NOTE — SLP Progress Note (Signed)
Speech Language Pathology  Inpatient Rehabilitation Daily Progress Note    Patient Name:  [name]       Medical Record Number: [id number]   Date of Birth: [birth year]  Sex: Female          Room/Bed:  M501/M501-02    Therapy Received:  Start Time: 1000   Stop Time: 1100  Total Therapy Minutes: 60    Rehabilitation Precautions/Restrictions:  Weight Bearing Status: no restrictions  Precaution Instructions Given to Patient: Yes  Restricted BP Precautions: other (comment) (ACE wraps, abdominal binder)  Communication Precautions: Speak slowly  Other Precautions: falls, decreased LE strength/sensation, orthostatic hypotension, ABD binder, TED hose and ACE wraps as needed    Rehab Diagnosis: 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 Orientation to template with visual cues needed X 1/3 opportunities to learn accurate placement of info. Pt able to recall and list details of 2/3 therapies from previous day accurately, 3/3 with logical associative cues re: goal and location of therapy. Pt expressing value to this approach but hesitant about effort to track daily, though receptive to attempting this. Home assignment provided to attempt tracking for this day. Pt able to identify an appropriate section in her hospital binder to insert tracking.      Education: Followed up on utility of phone alarms which pt stated were effective majority of the time, except in 2 instances when she did not trigger prospective behavior of fluid intake. Extensive discussion to identify barrier to engaging in fluid intake in both instances, and pt noted to recognize that disruption to a personal activity is barrier to initiating  fluid intake behavior. Extensively educated on correlation of fluid intake, low BP, and sequelae such as candidacy for pain meds, feeling cold, and remote impacts on mobility safety. Pt also sharing toward end of session that she was wanting to not do PT session next hour, stating she was feeling 'overwhelmed'. Education provided on pt's rights and implications of refusing on progress, customizing education to pt-specific comorbidities (low BP, endurance, etc) and reiterated pt's autonomy and voluntary capacity to evaluate risks/benefits to making her decision.     Appreciate RN handoff that pt with low BP this AM and not a candidate for pain meds. PO intake continues to remain low.            Education Documentation  Compensatory cognitive strategies/aids, taught by Barbara Cower, SLP at 01/20/2022 12:22 PM.  Learner: Patient  Readiness: Acceptance  Method: Explanation  Response: Verbalizes Understanding, Demonstrated Understanding, Needs Reinforcement    Education Comments  No comments found.        Assessment & Plan   Pt demonstrating some self-limiting behaviors that would impede her ability to make sustainable traction with mobility goals and consistent uptake of cognitive strategies. Discharge to SNF still appropriate based on pt presentation and EHR notes outlining discussions between CM and pt's daughter.    Plan:  Risks/Benefits/POC Discussed with Pt/Family: With patient  Treatment/Interventions: Speech/Language treatment, Cognitive linguistic retraining    Recommendation:  Discharge Recommendation: SNF  SLP Therapy Recommendation: 5-6 days/week, 60-120 mins/day, 1:1 treatment  SLP Estimated  Length of Stay: March 4    Swallow Recommendations:   Diet Solids Recommendation: regular  Diet Liquids Recommendations: thin consistency  Precautions/Compensations: Awake/alert, Upright 90 degrees for all oral intake  Recommended Form of Meds: PO, whole, with liquid

## 2022-01-20 NOTE — Plan of Care (Signed)
Pain alert and oriented x4, continues on pain management with prn dilaudid. Patient continues on bladder program and in& out done.  Problem: Bladder Management  Goal: LTG: Pt will be 100% continent and independent with bladder elim at [place]  Outcome: Progressing     Problem: Cardiac Function  Goal: LTG: Pt will be independent with BP and pulse management and medication at [place]  Outcome: Progressing     Problem: Pain Management  Goal: LTG: Pt will achieve good pain control with current medication regimen as evidenced by ability to participate in 100% of therapy sessions.  Outcome: Progressing

## 2022-01-21 NOTE — Progress Notes (Signed)
MEDICINE PROGRESS NOTE  [facility], DIVISION OF HOSPITALIST MEDICINE   [facility] [place] [facility]   [place] Pager: [phone]      Date Time: 01/21/22 10:56 AM  Patient Name: [name]  Attending Physician: Anastasia Pall, [facility] Day: 19  Assessment:   66 year old female with past medical history significant for diabetes, hypertension, nonischemic cardiomyopathy, paroxysmal A-fib, anemia, bipolar, TIAs presented to [facility]  hospital on 2/7 with lower extremity weakness, paresthesias, transaminitis thought to have 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 Patient was discharged on 2/17 and went back to the hospital on 2/18 for continued management of rhabdomyolysis.  Patient noted to have left-sided weakness with associated difficulty with walking, standing, also developing bilateral hand and feet paresthesias and hand weakness,she was seen by neurology, work-up including MRIs, lumbar puncture done with no clear etiology,was transferred to [facility] for consideration for IVIG.  While at [facility], first dose of IVIG was started,but apparently,she developed allergic reaction therefore IVIG was discontinued. Diagnosis at the time of discharge stayed unclear,DDx was possible statin induced myositis vs. inflammatory myositis vs. immune mediated myositis.  She was also noted to have multiple vitamin deficiencies including D, zinc, B6, iron deficiency which are being replaced.  Patient was discharged to acute rehab on 3/14.    Recommendations:   Myositis: Unclear etiology including possible statin induced myositis versus inflammatory arthritis versus immune mediated myositis  -Attempted IVIG at [facility] unsuccessful due to development of allergic reaction  -Patient's access was removed - as the initial plan to plasma exchange was cancelled. 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 and less likely to suggest autoimmune process   -Multiple vitamin  deficiencies including vitamin D, zinc, B6, iron which are repleted  -Ongoing PT/OT per rehab protocol  -Avoid statins  -Negative paraneoplastic autoantibody from 3/7    Hyponatremia, resolved likely hypovolemia based on low urine Na and urine osmolality   -Sodium stable including a value of 137 on 3/29  -monitor BMP periodically     Improved metabolic acidosis  -HCO3 as low as 12 on 3/22 and improved at this time including labs 3/27    Vitamin D, B6, B1, zinc deficiencies  -Continue multivitamin supplement  -Continue thiamine(received IV replacement 3/4-3/9), vitamin B6, zinc supplements     Normocytic anemia  -Noted TSAT of 21%, ferritin 492, normal folate level on 3/10;  -normal vitamin B12 level on 3/4  -cont ferrous sulfate  -Hgb has been stable     Moderate protein calorie malnutrition  -Weight loss, was on Ozempic but discontinued  -c/w glucerna and regular diet    Paroxysmal A-fib  -currently in sinus with intermittent tachycardia  -cont metoprolol XL 125 mg in evening (increased from 100 mg on 3/17)  -Continue Eliquis  -Follow heart rate    Orthostatic hypotension  -Responding to midodrine 5 mg in am and afternoon for now (continuing metoprolol at night with tachycardia and PAF)  -c/w compression stocking and abd binder  -Initially on Flomax; may consider discontinuing if significant orthostatic hypotension     Urinary retension  -Had Foley catheter, failed voiding trial on 3/13   -Monitor with voiding trial status post removal of Foley catheter 3/27 and monitor with voiding trial  -Continue Flomax for now, follow BP  -Bladder scan and straight cath PRN    COPD--Not in exacerbation   -continue Symbicort  -prn duonebs  -Chest x-ray was done 3/21-no acute changes    History of nonischemic cardiopathy  -Echo 12/31/2021 with  EF 43%, mild global hypokinesis  -Noted per cardiology note 3/13 with no further plans for ischemic work-up as patient had cardiac catheterization in 2022 with no obstructive  CAD  -outpatient follow-up for nonischemic cardiomyopathy work-up  -Repeat echo as outpatient     Bipolar disorder  -Continue home meds including Seroquel, ropinirole; home latuda 120mg  daily(pt has home med)     Diabetes  -Hemoglobin A1C 5.3% on 3/6  -Patient was on Ozempic but stopped due to malnutrition/weight loss  -No need for sliding-scale   -Continue diet control    H/o GERD  -c/w PPI    Constipation  -Continue bowel regimen    DVT ppx:on Eliquis as above    Discussed with patient and nurse    Subjective/ROS/24hr events:     CC: Myositis    HPI: Patient seen and examined.  No events overnight.  She states she still unable to urinate spontaneously.  Denies any chest pain or shortness of breath.  No nausea or vomiting.  No fevers.  Patient required straight catheterization last evening.  She does states she has some urgency to urinate today.      Physical Exam:     Temp:  [98.1 F (36.7 C)-98.8 F (37.1 C)] 98.8 F (37.1 C)  Heart Rate:  [100-110] 100  Resp Rate:  [18] 18  BP: (101-130)/(64-78) 101/64    Intake/Output Summary (Last 24 hours) at 01/21/2022 1056  Last data filed at 01/21/2022 1610  Gross per 24 hour   Intake 240 ml   Output 2075 ml   Net -1835 ml      General: alert and awake, no distress  HEENT: anicteric, moist mucous membranes  Neck: supple, no JVD  CVS: nl S1, S2; no gallop or rub  Lungs: clear to auscultation b/l, no wheezing or rhonchi; normal resp effort  Abd: soft, non-tender, non-distended  Ext: no pedal edema, no cyanosis  Neuro: alert and awake, moving all extremities with overall weakness in LE especially        Meds:   Medications were reviewed:  Scheduled Meds:  Current Facility-Administered Medications   Medication Dose Route Frequency    apixaban  5 mg Oral Q12H SCH    budesonide-formoterol  2 puff Inhalation BID    ferrous sulfate  324 mg Oral QAM W/BREAKFAST    lidocaine  1 patch Transdermal Q24H    lurasidone  120 mg Oral QPM    metoprolol succinate  125 mg Oral QHS     midodrine  7.5 mg Oral BID Meals    pantoprazole  40 mg Oral QAM AC    polyethylene glycol  17 g Oral Daily    QUEtiapine  100 mg Oral QHS    rOPINIRole  1 mg Oral QHS    senna-docusate  2 tablet Oral QHS    tamsulosin  0.4 mg Oral Daily after dinner    thiamine  100 mg Oral Daily    vitamin B-6  50 mg Oral Daily    vitamin D  25 mcg Oral Daily    vitamins/minerals  1 tablet Oral Daily at 1700    zinc sulfate  220 mg Oral Daily at 1700     Continuous Infusions:  PRN Meds:.acetaminophen, albuterol-ipratropium, bisacodyl, bisacodyl, diphenhydrAMINE, gabapentin, HYDROmorphone, lactulose, LORazepam, melatonin, naloxone, promethazine  Labs/Radiology:   Imaging personally reviewed, including: all available   No results found.  No results for input(s): GLUCOSEWB in the last 24 hours.  Recent Labs   Lab 01/20/22  1610 01/17/22  0643 01/15/22  0531   Sodium 137 135 134*   Potassium 3.6 4.2 5.0   Chloride 106 108 106   BUN 3.0* 6.0* 7.0   Creatinine 0.5 0.6 0.7   EGFR >60.0 >60.0 >60.0   Glucose 96 103* 99   Calcium 8.5 9.0 9.2       Recent Labs   Lab 01/20/22  0658 01/17/22  0643   WBC 7.13 7.03   Hgb 8.1* 8.6*   Hematocrit 25.5* 26.8*   Platelets 339 378*           Signed by: Louann Liv, DO

## 2022-01-21 NOTE — SLP Progress Note (Signed)
Speech Language Pathology  Inpatient Rehabilitation Daily Progress Note    Patient Name:  [name]       Medical Record Number: [id number]   Date of Birth: 19-Aug-1956  Sex: Female          Room/Bed:  M501/M501-02    Therapy Received:  Start Time: 1400    Stop Time: 1500  Total Therapy Minutes: 60    Rehabilitation Precautions/Restrictions:  Weight Bearing Status: no restrictions  Precaution Instructions Given to Patient: Yes  Restricted BP Precautions: other (comment) (ACE wraps, abdominal binder)  Communication Precautions: Speak slowly  Other Precautions: falls, decreased LE strength/sensation, orthostatic hypotension, ABD binder, TED hose and ACE wraps as needed    Rehab Diagnosis: 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 (Pt stating when offered option to discharge SLP in favor of 3rd PT hour)  Patient/Caregiver Goals: I want to be independent  Pain Assessment:  Pain Assessment: No/denies pain    Objective         Interventions:     Everyday Memory Questionnaire re-administered for progress monitoring and collaborate on goals as pt continuing to state that memory is impaired and interested in addressing her goals. Unable to generate goals, hence PROM items leveraged to initiate discussion. Pt unable to generate a single strategy on items rated as 3 or 4 on this questionnaire, with clinician guiding strategy selection, though ironically reporting none of the issues as a significant hindrance in communication, routines, or participatory roles. Extensive open-ended discussion on systems used in the past for memory and pt noting that she uses her '[employer] notes' app on smartphone to track MD appointments and other pertinent information. Receptive to home assignment to record details from rehab appointments this day to improve tracking.   Pt unable to recall home assignment post 15 minute delay, whiteboard used to explicitly cue  pt for [facility] completion.    Assessment:  EVERYDAY MEMORY QUESTIONNAIRE - REVISED (EMQ-R)  0 = ONCE OR LESS IN THE LAST MONTH   1 = MORE THAN ONCE A MONTH BUT LESS THAN ONCE A WEEK   2 = ABOUT ONCE A WEEK   3 = MORE THAN ONCE A WEEK OR LESS THAN ONCE A DAY   4 = ONCE OR MORE IN A DAY    Having to check whether you have done something that you should have done. 4  Forgetting when it was that something happened; for example, whether it was yesterday or last week. 1  Forgetting that you were told something yesterday or a few days ago, and maybe having to be reminded about it. 1  Starting to read something (a book or an article in a newspaper, or a magazine) without realizing you have already read it before.  0  Finding that a word is on the tip of your tongue. You know what it is but cannot quite find it. 4  Completely forgetting to do things you said you would do, and things you planned to do. 2  Forgetting important details of what you did or what happened to you the day before. 2  When talking to someone, forgetting what you have just said. Maybe saying 'what was I talking about?' 4  When reading a newspaper or magazine, being unable to follow the thread of a story and losing track of what it is about. 1  Forgetting to tell  somebody something important, perhaps forgetting to pass on a message or remind someone of something. 1  Getting the details of what someone has told you mixed up and confused. 3  Forgetting where things are normally kept or looking for them in the wrong place. 4  Repeating to someone what you have just told them or asking someone the same question twice. 4    Total: 31/52 (higher scores = more severe self-report of memory impairment). Increase in score since initial administration when score was 4/52.      Education Documentation  Compensatory cognitive strategies/aids, taught by Barbara CowerKucheria, Hazelle Woollard, SLP at 01/21/2022 11:43 PM.  Learner: Patient  Readiness: Acceptance  Method: Explanation  Response:  Verbalizes Understanding, Needs Reinforcement    Education Comments  No comments found.        Assessment & Plan   Increase in perception of memory impairment likely a result of improved awareness and attention in the last week with regard to cognitive functioning, leading to greater acuity on analyzing PROM items. Pt notes that she has a system established on her phone, but not readily using it for tracking daily correspondences. Agreeable to trialing this route, and abandoning use of paper-pencil tracking aid created last session.      Plan:  Risks/Benefits/POC Discussed with Pt/Family: With patient  Treatment/Interventions: Speech/Language treatment, Cognitive linguistic retraining    Recommendation:  Discharge Recommendation: SNF  SLP Therapy Recommendation: 5-6 days/week, 60-120 mins/day, 1:1 treatment  SLP Estimated Length of Stay: March 4    Swallow Recommendations:   Diet Solids Recommendation: regular  Diet Liquids Recommendations: thin consistency  Precautions/Compensations: Awake/alert, Upright 90 degrees for all oral intake  Recommended Form of Meds: PO, whole, with liquid

## 2022-01-21 NOTE — Plan of Care (Signed)
Problem: Bladder Management  Goal: LTG: Pt will be 100% continent and independent with bladder elim at [place]  Outcome: Progressing     Problem: Cardiac Function  Goal: LTG: Pt will be independent with BP and pulse management and medication at [place]  Outcome: Progressing     Problem: Pain Management  Goal: LTG: Pt will achieve good pain control with current medication regimen as evidenced by ability to participate in 100% of therapy sessions.  Outcome: Progressing     Problem: Safety Risk  Goal: LTG: Pt will recognize limitations and use call bell 100% of the time prior to transfers in order to prevent fsalls  Outcome: Progressing     Problem: Skin Wound Management  Goal: LTG: Excoriation at buttocks will resolve by [place]  Outcome: Progressing

## 2022-01-21 NOTE — Progress Notes (Signed)
PHYSICAL MEDICINE AND REHABILITATION  PROGRESS NOTE  FACE TO [name]    Date Time: 01/21/22 5:59 PM    Patient Name: [name],[name]  Admission date:  01/03/2022  (LOS: 18 days)    Subjective:     Patient denies pain. Slept well. Having regular bowel movements. No new issues.   Discussed with neurology, will evaluate Monday, plan for discharge to SNF next week    Functional Status:     Patient with increased tolerance to weightbearing activity today for short periods of ambulation  with a RW (10 feet), Mod A given and no WC follow. Plan to continue gait training and weightbearing activity. Patient to benefit from skilled PT to address these deficits and decrease BOC for a safe [place] home.    Medications:   Medication reviewed by me:     Scheduled Meds: PRN Meds:    apixaban, 5 mg, Oral, Q12H SCH  budesonide-formoterol, 2 puff, Inhalation, BID  ferrous sulfate, 324 mg, Oral, QAM W/BREAKFAST  lidocaine, 1 patch, Transdermal, Q24H  lurasidone, 120 mg, Oral, QPM  metoprolol succinate, 125 mg, Oral, QHS  midodrine, 7.5 mg, Oral, BID Meals  pantoprazole, 40 mg, Oral, QAM AC  polyethylene glycol, 17 g, Oral, Daily  QUEtiapine, 100 mg, Oral, QHS  rOPINIRole, 1 mg, Oral, QHS  senna-docusate, 2 tablet, Oral, QHS  tamsulosin, 0.4 mg, Oral, Daily after dinner  thiamine, 100 mg, Oral, Daily  vitamin B-6, 50 mg, Oral, Daily  vitamin D, 25 mcg, Oral, Daily  vitamins/minerals, 1 tablet, Oral, Daily at 1700  zinc sulfate, 220 mg, Oral, Daily at 1700        Continuous Infusions:   acetaminophen, 650 mg, Q4H PRN  albuterol-ipratropium, 3 mL, Q6H PRN  bisacodyl, 10 mg, Daily PRN  bisacodyl, 10 mg, Daily PRN  diphenhydrAMINE, 25 mg, Q6H PRN  gabapentin, 100 mg, TID PRN  HYDROmorphone, 2 mg, Q6H PRN  lactulose, 20 g, Q4H PRN  LORazepam, 0.5 mg, QHS PRN  melatonin, 3 mg, QHS PRN  naloxone, 0.4 mg, PRN  promethazine, 25 mg, Q6H PRN          Medication Review  A complete drug regimen review was completed: Yes  Were drug issues were found  during review: No   If yes to drug issues found during review:  What was the issue?:   What was the time the issue was identified?:   Was I contacted and action was taken by midnight of the next calendar day once issue was identified?: N/A   Person who contacted me:   Action taken:     Review of Systems:   A comprehensive review of systems was: No fevers, chills, nausea, vomiting, chest pain, shortness of breath, cough, headache, double vision.  All others negative.    Labs:     Recent Labs   Lab 01/20/22  0658 01/17/22  0643   WBC 7.13 7.03   Hgb 8.1* 8.6*   Hematocrit 25.5* 26.8*   Platelets 339 378*      Recent Labs   Lab 01/20/22  0658 01/17/22  0643 01/15/22  0531   Sodium 137 135 134*   Potassium 3.6 4.2 5.0   Chloride 106 108 106   CO2 25 21 18    BUN 3.0* 6.0* 7.0   Creatinine 0.5 0.6 0.7   Calcium 8.5 9.0 9.2   Glucose 96 103* 99         Rads:   Radiological Procedure reviewed.  Radiology Results (24 Hour)       **  No results found for the last 24 hours. **          Physical Exam:     Vitals:    01/21/22 1300 01/21/22 1312 01/21/22 1342 01/21/22 1614   BP: 110/58 99/56 110/58 117/61   Pulse: (!) 112 (!) 112 (!) 117 (!) 106   Resp:    16   Temp:    97.7 F (36.5 C)   TempSrc:    Oral   SpO2:   98% 98%   Weight:       Height:           Intake and Output Summary (Last 24 hours) at Date Time    Intake/Output Summary (Last 24 hours) at 01/21/2022 1759  Last data filed at 01/21/2022 1727  Gross per 24 hour   Intake 720 ml   Output 1900 ml   Net -1180 ml     P.O.: 240 mL (01/21/22 1727)     Urine: 0 mL (01/20/22 0809)  Bladder Scan Volume (mL): 328 mL (01/21/22 1549)  Intermittent/Straight Cath (mL): 350 mL (01/21/22 1549)       Gen: Alert, NAD  Psych: Pleasant and cooperative  Skin: Pink, warm, no rashes  HEENT: Atraumatic, normocephalic,   Chest: symmetric bilateral chest rise and fall,   CV: Regular rate and rhythm, no murmurs  Resp: breath sounds equal throughout; ctax2  Abd: non-distended, +BS  Extr: Pink,  warm,  Neuro:  Moving all ext    Assessment and Plan:     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   Occupational Therapy focused on improving strength, endurance, transfers, activities of daily living, and cognition for 60-120 mins/day, 5-6 days/week, for 14 days.     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%   Metoprolol,   losartan held due to hypotension     # COPD  Symbicort      # bipolar disorder  Latuda   Seroquel     # Pain Management   Daily monitoring   gabapentin  Acetaminophen, dilaudid PRN     #Bowel Management  constipation - Senna, Colace, Bisacodyl suppository PRN; hold for loose stools    #Bladder Management  Check PVRs    #Skin Management  Frequent skin assessment  Moisture barrier PRN    #FEN/GI with severe malnutrition   cardiac diet    #DVT PPx  eliquis    #Dispo  Pending rehab progress      Continue comprehensive and intensive inpatient rehab program, including:   Physical therapy 60-120 min daily, 5-6 times per week, Occupational therapy 60-120 min daily, 5-6 times per week, Case management and Rehabilitation nursing.  Psychology services to evaluate and treat as needed     Signed by: Cheryll Dessert, MD   01/21/2022, 5:59 PM    [facility]

## 2022-01-21 NOTE — Plan of Care (Signed)
Problem: Cardiac Function  Goal: LTG: Pt will be independent with BP and pulse management and medication at [place]  Outcome: Progressing   Teaching provided on assessing BP and medication side effects. All questions answered to pt's satisfaction.  Problem: Pain Management  Goal: LTG: Pt will achieve good pain control with current medication regimen as evidenced by ability to participate in 100% of therapy sessions.  Outcome: Progressing   Back pain controlled with dilaudid and relaxation techniques.

## 2022-01-21 NOTE — Progress Notes (Signed)
Nutrition Support Services  [facility]   Main Office Telephone: [phone]    Nutrition Follow-up    [name] 66 y.o. female   MRN: [id number]    Summary of Nutrition Recommendations:  Continue current diet to optimize PO intake in the setting of malnutrition     Vanilla Ensure HP with meals to optimize PO intake in the setting of malnutrition     3. Weekly weights per standing scale as able      4. Record % PO intake per flowsheets    -----------------------------------------------------------------------------------------------------------------                                                           ASSESSMENT DATA     Subjective Nutrition: Pt met at bedside. She reports having fair appetite. Her lunch was 75% consumed at time of visit. She has had variable meal intake according to documentation. She consumes 25-100% of meals. Writer encouraged pt to consume ONS and meals. Pt prefers vanilla ONS.    Learning Needs: None at this time    Events of Current Admission:  66 y.o. year old female [facility] admit 2/7-2/17/23 for rhabdomyolysis + transaminitis + bilateral leg weakness + right paresthesias presents to [facility] with difficulty standing and walking, bilateral leg/hand weakness, bilateral leg/hand/face paresthesias. Hx of poor PO, wt loss, started on thiamine, K repleted. Admitted to acute rehab 3/15.    Medical Hx:  has a past medical history of Anemia, Asthma, Atrial fibrillation, Chronic obstructive pulmonary disease, Convulsions, Depression, Diabetes mellitus, Hyperlipidemia, Hypertension, and TIA (transient ischemic attack).     Active Hospital Problems    Diagnosis    Myositis       Orders Placed This Encounter   Procedures    Diet regular    Glucerna Shake (8 Oz Cans) Quantity: A. One; Frequency: TID (3 times a day) with meals         ANTHROPOMETRIC  Height: 165.1 cm (5\' 5" )  Weight: 58.6 kg (129 lb 3 oz)                    Body mass index is 21.5 kg/m.     ESTIMATED NEEDS    Total  Daily Energy Needs: 1824 to 2128 kcal  Method for Calculating Energy Needs: 30 kcal - 35 kcal per kg  at 60.8 kg (Actual body weight)  Rationale: Malnutrition     Total Daily Protein Needs: 72.96 to 91.2 g  Method for Calculating Protein Needs: 1.2 g - 1.5 g per kg at 60.8 kg (0 body weight)       Total Daily Fluid Needs: 1824 ml  Method for Calculating Fluid Needs: ABW x 30 ml  Rationale: General guideline    Pertinent Medications:   IVF:    apixaban, 5 mg, Q12H SCH  budesonide-formoterol, 2 puff, BID  ferrous sulfate, 324 mg, QAM W/BREAKFAST  lidocaine, 1 patch, Q24H  lurasidone, 120 mg, QPM  metoprolol succinate, 125 mg, QHS  midodrine, 7.5 mg, BID Meals  pantoprazole, 40 mg, QAM AC  polyethylene glycol, 17 g, Daily  QUEtiapine, 100 mg, QHS  rOPINIRole, 1 mg, QHS  senna-docusate, 2 tablet, QHS  tamsulosin, 0.4 mg, Daily after dinner  thiamine, 100 mg, Daily  vitamin B-6, 50 mg, Daily  vitamin  D, 25 mcg, Daily  vitamins/minerals, 1 tablet, Daily at 1700  zinc sulfate, 220 mg, Daily at 1700        Pertinent labs:  Recent Labs   Lab 01/20/22  0658 01/17/22  0643 01/15/22  0531   Sodium 137 135 134*   Potassium 3.6 4.2 5.0   Chloride 106 108 106   CO2 25 21 18    BUN 3.0* 6.0* 7.0   Creatinine 0.5 0.6 0.7   Glucose 96 103* 99   Calcium 8.5 9.0 9.2   EGFR >60.0 >60.0 >60.0   WBC 7.13 7.03  --    Hematocrit 25.5* 26.8*  --    Hgb 8.1* 8.6*  --    Labs reviewed                                                      NUTRITION DIAGNOSIS     Inadequate Protein-energy intake-Active     Severe malnutrition.-Active      Unintentional weight loss-active                                                           INTERVENTION     Nutrition recommendation - Please refer to top of note                                                       MONITORING/EVALUATION     Goals: Po intake increases to 75% or more to meet estimated needs.-Active     Monitor: PO intake, ONS tolerance, wts, labs      Follow up: 5-14 days    Alba Cory,  RDN  [phone]

## 2022-01-21 NOTE — Progress Notes (Signed)
CM folllowing for discharge planning to SNF.   Referrals generated to facilities with several accepting.  Multiple calls placed to daughter and messages left requesting c/b.  CM will request first accepting facility to request AUTH for admission.   [facility], previously [facility].

## 2022-01-21 NOTE — PT Progress Note (Signed)
Physical Therapy  Inpatient Rehabilitation Daily Progress Note    Patient Name:  [name]       Medical Record Number: [id number]   Date of Birth: 05/16/1956  Sex: Female          Room/Bed:  M501/M501-02    Therapy Received:  Start Time: 1300   Stop Time: 1400  Total Therapy Minutes: 60    Rehabilitation Precautions/Restrictions:  Weight Bearing Status: (P) no restrictions  Precaution Instructions Given to Patient: (P) Yes  Restricted BP Precautions: other (comment) (ACE wraps, abdominal binder)  Communication Precautions: Speak slowly  Other Precautions: (P) falls, decreased LE strength/sensation, orthostatic hypotension, ABD binder, TED hose and ACE wraps as needed    Rehab Diagnosis: 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 An abdominal binder and bilateral ACE wraps were dep donned due to patient's tendencies for BP to drop. BP remained WNL throughout this session (see flowsheet).     Today's session focused on weightbearing activity. Patient ambulated 4 x 10 feet to a chair set up in the hallway with a RW and Mod A x 1 due to decreased LE strength and balance. Patient successfully completing turns to sit in the chair with the same Mod A x 1 given. VC given when completing sit<>stand for a strong anterior head and trunk lean. Visual demonstration of proper sit<>stand with strong forward lean given. Today was the first time this patient has successfully trialed short periods of ambulation without a close WC follow. Finally, patient completed 150 feet of WC mobility with UE propulsion independently back to her room. All needs met at the completion of the session.          Education Materials engineerDocumentation  Wheelchair management/mobility, taught by Marquis BuggyJones, Seren Chaloux at 01/21/2022  3:08 PM.  Learner: Patient  Readiness: Acceptance  Method: Explanation  Response: Verbalizes Understanding    Safety issues and interventions, taught by Marquis BuggyJones, Mindee Robledo at 01/21/2022  3:08 PM.  Learner: Patient  Readiness: Acceptance  Method: Explanation  Response: Verbalizes Understanding    Rehab techniques/procedure, taught by Marquis BuggyJones, Marly Schuld at 01/21/2022  3:08 PM.  Learner: Patient  Readiness: Acceptance  Method: Explanation  Response: Verbalizes Understanding    Precautions, taught by Marquis BuggyJones, Lila Lufkin at 01/21/2022  3:08 PM.  Learner: Patient  Readiness: Acceptance  Method: Explanation  Response: Verbalizes Understanding    Plan of care, taught by Marquis BuggyJones, Felis Quillin at 01/21/2022  3:08 PM.  Learner: Patient  Readiness: Acceptance  Method: Explanation  Response: Verbalizes Understanding    Pain management, taught by Marquis BuggyJones, Jamesha Ellsworth at 01/21/2022  3:08 PM.  Learner: Patient  Readiness: Acceptance  Method: Explanation  Response: Verbalizes Understanding    Home management activities, taught by Marquis BuggyJones, Tacy Chavis at 01/21/2022  3:08 PM.  Learner: Patient  Readiness: Acceptance  Method: Explanation  Response: Verbalizes Understanding    Home exercise program, taught by Marquis BuggyJones, Oliver Neuwirth at 01/21/2022  3:08 PM.  Learner: Patient  Readiness: Acceptance  Method: Explanation  Response: Verbalizes Understanding    Functional transfers/mobility, taught by Marquis BuggyJones, Teigen Bellin at 01/21/2022  3:08 PM.  Learner: Patient  Readiness: Acceptance  Method: Explanation  Response: Verbalizes Understanding    Fall prevention/balance training, taught by Marquis Buggy at 01/21/2022  3:08 PM.  Learner: Patient  Readiness: Acceptance  Method: Explanation  Response: [name] Understanding    Equipment, taught by Marquis Buggy at 01/21/2022  3:08 PM.  Learner: Patient  Readiness: Acceptance  Method: Explanation  Response: [employer] resources, taught by Marquis Buggy at 01/21/2022  3:08 PM.  Learner: Patient  Readiness: Acceptance  Method: Explanation  Response: Verbalizes Understanding    Cognitive function, taught by Marquis Buggy at 01/21/2022  3:08 PM.  Learner: Patient  Readiness: Acceptance  Method: Explanation  Response: Verbalizes Understanding    ADL retraining, taught by Marquis Buggy at 01/21/2022  3:08 PM.  Learner: Patient  Readiness: Acceptance  Method: Explanation  Response: Verbalizes Understanding    Education Comments  No comments found.         Assessment & Plan   Patient with increased tolerance to weightbearing activity today for short periods of ambulation  with a RW (10 feet), Mod A given and no WC follow. Plan to continue gait training and weightbearing activity. Patient to benefit from skilled PT to address these deficits and decrease BOC for a safe [place] home.    Plan:  Risks/Benefits/POC Discussed with Pt/Family: With patient  Treatment/Interventions: Gait training, Exercise, Stair training, Neuromuscular re-education, Functional transfer training, LE strengthening/ROM, Endurance training, Patient/family training, Equipment eval/education, Bed mobility, Compensatory technique education, Continued evaluation    Recommendation:  Discharge Recommendation: Home with home health PT, SNF (will continue to assess)  PT Therapy Recommendation: 5-6 days/week, 60-120 mins/day, 1:1 treatment  PT Estimated Length of Stay: 10-14 days from IE on 01/04/2022

## 2022-01-21 NOTE — Plan of Care (Addendum)
NURSE NOTE SUMMARY  [facility] [place] [facility]A [facility] STRO[facility]   Patient Name: [name]   Attending Physician: Anastasia Pall, Edson Snowball*   Today's date:   01/22/2022 LOS: 19 days   Shift Summary:                                                              Assumed pt care at 1900. Pt A/Ox4. VSS. Pt denies chest pain or SOB. Breathing treatment given by RT. Tele in place, sinus to sinus tach on monitor. Pt continues to c/o lower back pain. Prn PO dilaudid given with pain with some relief. PO ativan given for anxiety. Bladder scan performed as order, straight cath x 2 overnight. Bleeding and abrasion noted to left side genitalia caused by pt scratching due to irritation. Area cleansed and Z-guard skin protectant applied. No BM this shift. Discussed bowel regimen with pt. Lactulose given this morning. Fall/skin precautions in place. No acute events overnight.    Provider Notifications:        Rapid Response Notifications:  Mobility:          Weight tracking:  Family Dynamic:   No data found.          Last Bowel Movement   Last BM Date: 01/18/22        Problem: Bladder Management  Goal: LTG: Pt will be 100% continent and independent with bladder elim at [place]  Outcome: Progressing     Problem: Cardiac Function  Goal: LTG: Pt will be independent with BP and pulse management and medication at [place]  Outcome: Progressing     Problem: Pain Management  Goal: LTG: Pt will achieve good pain control with current medication regimen as evidenced by ability to participate in 100% of therapy sessions.  Outcome: Progressing     Problem: Safety Risk  Goal: LTG: Pt will recognize limitations and use call bell 100% of the time prior to transfers in order to prevent fsalls  Outcome: Progressing     Problem: Skin Wound Management  Goal: LTG: Excoriation at buttocks will resolve by [employer]  Outcome: Progressing     Problem: Compromised Tissue integrity  Goal: Damaged tissue is healing and protected  Outcome:  Progressing  Flowsheets (Taken 01/10/2022 1940 by Kerry Fort., RN)  Damaged tissue is healing and protected:   Monitor/assess Braden scale every shift   Reposition patient every 2 hours and as needed unless able to reposition self   Increase activity as tolerated/progressive mobility   Relieve pressure to bony prominences for patients at moderate and high risk   Avoid shearing injuries   Keep intact skin clean and dry   Use bath wipes, not soap and water, for daily bathing   Use incontinence wipes for cleaning urine, stool and caustic drainage. Foley care as needed   Monitor external devices/tubes for correct placement to prevent pressure, friction and shearing   Encourage use of lotion/moisturizer on skin   Monitor patient's hygiene practices   Utilize specialty bed  Goal: Nutritional status is improving  Outcome: Progressing  Flowsheets (Taken 01/21/2022 2234)  Nutritional status is improving:   Allow adequate time for meals   Include patient/patient care companion in decisions related to nutrition     Problem: Mobility  Goal: LTG: Patient will ambulate >150 feet at a Mod I level and navigate 1 FOS with CGA, both with LRAD in order to access the home and community environments.   Outcome: Progressing     Problem: Self Care Management  Goal: LTG: Pt will perform ADL routine at spv level with use of compensatory strategies and AE prn upon d/c.   Outcome: Progressing     Problem: Hemodynamic Status: Cardiac  Goal: Stable vital signs and fluid balance  Outcome: Progressing  Flowsheets (Taken 01/04/2022 1751 by Jenene Slicker A, RN)  Stable vital signs and fluid balance:   Monitor/assess vital signs and telemetry per unit protocol   Assess signs and symptoms associated with cardiac rhythm changes   Monitor intake/output per unit protocol and/or LIP order   Monitor for leg swelling/edema and report to LIP if abnormal   Monitor lab values   Weigh on admission and record weight daily     Problem: Inadequate Tissue  Perfusion  Goal: Adequate tissue perfusion will be maintained  Outcome: Progressing  Flowsheets (Taken 01/04/2022 1751 by Jenene Slicker A, RN)  Adequate tissue perfusion will be maintained:   Monitor/assess vital signs   Monitor/assess lab values and report abnormal values   Monitor/assess neurovascular status (pulses, capillary refill, pain, paresthesia, paralysis, presence of edema)   Monitor intake and output   Monitor for signs and symptoms of a pulmonary embolism (dyspnea, tachypnea, tachycardia, confusion)   Encourage/assist patient as needed to turn, cough, and perform deep breathing every 2 hours   Reinforce use of ordered respiratory interventions (i.e. CPAP, BiPAP, Incentive Spirometer, Acapella, etc.)     Problem: Nutrition  Goal: Nutritional intake is adequate  Outcome: Progressing  Flowsheets (Taken 01/21/2022 2234)  Nutritional intake is adequate:   Assist patient with meals/food selection   Allow adequate time for meals   Encourage/perform oral hygiene as appropriate   Encourage/administer dietary supplements as ordered (i.e. tube feed, TPN, oral, OGT/NGT, supplements)   Consult/collaborate with Clinical Nutritionist   Include patient/patient care companion in decisions related to nutrition   Assess anorexia, appetite, and amount of meal/food tolerated   Monitor daily weights     Problem: Communication  Goal: Patient will be able to communicate wants/needs regarding POC utilizing compenstatory speech strategies at the discourse level with caregiver support as indicated.  Outcome: Progressing

## 2022-01-21 NOTE — OT Progress Note (Addendum)
Occupational Therapy  Inpatient Rehabilitation Daily Progress Note    Patient Name:  [name]       Medical Record Number: [id number]   Date of Birth: 12/19/1955  Sex: Female          Room/Bed:  M501/M501-02    Therapy Received:  Start Time: 1100   Stop Time: 1200  Total Therapy Minutes: 60    Rehabilitation Precautions/Restrictions:  Weight Bearing Status: no restrictions  Precaution Instructions Given to Patient: Yes  Restricted BP Precautions: other (comment) (ACE wraps, abdominal binder)  Communication Precautions: Speak slowly  Other Precautions: falls, decreased LE strength/sensation, orthostatic hypotension, ABD binder, TED hose and ACE wraps as needed    Rehab Diagnosis: 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 It happened a lot at home before this."  Patient/Caregiver Goals: I want to be independent  Pain Assessment:  Pain Assessment: No/denies pain    Objective   Vitals:  Heart Rate: 61  BP: 105/61  MAP (mmHg): 76    Interventions: Appreciate co-tx with Neuropsych Dr. Gwyneth Sprout for additional clinical reasoning and skilled psychosocial assessment and reasoning.     Session focused on functional mobility skills and improving confidence and safety. Patient practiced ambulating using parallel bars  for support. She began ambulating 10 feet forward and 5 feet back before reporting, "my legs feel weak, I need to sit." She required Max encouragement to take additional steps back with max assist and second set of hands to manage her severe retropulsion back into the wheelchair.    Appreciate Dr. Gwyneth Sprout, intervention and strategy of facing her fear of falling backwards when she feels weak. Patient able to report that she experienced many falls at home prior to hospitalization that increased her fear of falling. Dr. Gwyneth Sprout tasked patient with addressing her fear head on by remaining standing tall, despite the onset of BLE  fatigue.    Patient practiced additional ambulation 10 ft forward, 10 ft backward using parallel bars with close wheelchair follow. She was able to report when she needed to sit, and slowly return to sit with CGA and heavy VC for "pushing hips backward" to decrease "plopping" into wheelchair.    Patient practiced additional ambulation with distant wheelchair follow to encourage increased confidence. She ambulated forward and backwards, 10 feet with contact guard assist and verbal cues to increase step length and decrease bilateral shoulder elevation. Patient reporting she needed to sit, but encouraged to remain standing with contact guard assist and conversation about kids for additional minute before slow controls it back to wheelchair    Patient practiced final bout of ambulation using RW. She completed approximately 6 feet before requesting to sit.    Second trial patient able to ambulate over 12 feet with CGA, RW, and wheelchair follow. She was able to stand for additional 30 seconds after requesting to sit and was able to complete slow controlled descent back into chair with heavy verbal cues.    Appreciate Dr. Gwyneth Sprout, providing relaxation techniques and education on anxiety management throughout session     Education Documentation  Muscle weakness, taught by Davonna Belling, OT at 01/21/2022  2:13 PM.  Learner: Patient  Readiness: Acceptance  Method: Explanation, Demonstration, Teach Back  Response: Verbalizes Understanding, Demonstrated Understanding, Able to Teach Back, Needs Reinforcement    Emotional adjustments/depression, taught by Davonna Belling, OT at 01/21/2022  2:13 PM.  Learner: Patient  Readiness: Acceptance  Method: Explanation, Demonstration, Teach Back  Response: Verbalizes Understanding, Demonstrated Understanding, Able to Teach Back, Needs Reinforcement    Reconditioning, taught by Davonna Belling, OT at 01/21/2022  2:13 PM.  Learner: Patient  Readiness: Acceptance  Method: Explanation,  Demonstration, Teach Back  Response: Verbalizes Understanding, Demonstrated Understanding, Able to Teach Back, Needs Reinforcement    Verbalize detrimental effects of bedrest/inactivity, taught by Davonna Belling, OT at 01/21/2022  2:13 PM.  Learner: Patient  Readiness: Acceptance  Method: Explanation, Demonstration, Teach Back  Response: Verbalizes Understanding, Demonstrated Understanding, Able to Teach Back, Needs Reinforcement    Signs/symptoms associated with debility, taught by Davonna Belling, OT at 01/21/2022  2:13 PM.  Learner: Patient  Readiness: Acceptance  Method: Explanation, Demonstration, Teach Back  Response: Verbalizes Understanding, Demonstrated Understanding, Able to Teach Back, Needs Reinforcement    Safety issues and interventions, taught by Davonna Belling, OT at 01/21/2022  2:13 PM.  Learner: Patient  Readiness: Acceptance  Method: Explanation, Demonstration, Teach Back  Response: Verbalizes Understanding, Demonstrated Understanding, Able to Teach Back, Needs Reinforcement    Rehab techniques/procedure, taught by Davonna Belling, OT at 01/21/2022  2:13 PM.  Learner: Patient  Readiness: Acceptance  Method: Explanation, Demonstration, Teach Back  Response: Verbalizes Understanding, Demonstrated Understanding, Able to Teach Back, Needs Reinforcement    Precautions, taught by Davonna Belling, OT at 01/21/2022  2:13 PM.  Learner: Patient  Readiness: Acceptance  Method: Explanation, Demonstration, Teach Back  Response: Verbalizes Understanding, Demonstrated Understanding, Able to Teach Back, Needs Reinforcement    Plan of care, taught by Davonna Belling, OT at 01/21/2022  2:13 PM.  Learner: Patient  Readiness: Acceptance  Method: Explanation, Demonstration, Teach Back  Response: Verbalizes Understanding, Demonstrated Understanding, Able to Teach Back, Needs Reinforcement    Functional transfers/mobility, taught by Davonna Belling, OT at 01/21/2022  2:13 PM.  Learner: Patient  Readiness: Acceptance  Method:  Explanation, Demonstration, Teach Back  Response: Verbalizes Understanding, Demonstrated Understanding, Able to Teach Back, Needs Reinforcement    Fall prevention/balance training, taught by Davonna Belling, OT at 01/21/2022  2:13 PM.  Learner: Patient  Readiness: Acceptance  Method: Explanation, Demonstration, Teach Back  Response: Verbalizes Understanding, Demonstrated Understanding, Able to Teach Back, Needs Reinforcement    Education Comments  No comments found.      Assessment & Plan   Patient benefited highly from co-tx with Dr. Gwyneth Sprout for additional, skilled psychosocial support and coping strategies to utilize within session to decrease overall anxiety and improve safety when feeling like she needed to sit. She would benefit from continued practice with carrying over these strategies in situ to maximize functional mobility skills, reconditioning, and independence to decrease overall burden of care. She required VC throughout for breathing strategies, relaxation strategies, and decrease reliance on BUE support.     Plan:  Risks/Benefits/POC Discussed with Pt/Family: With patient  Treatment Interventions: ADL retraining, Functional transfer training, UE strengthening/ROM, Endurance training, Cognitive reorientation, Patient/Family training, Equipment eval/education, Neuro muscular reeducation, Fine motor coordination activities, Compensatory technique education    Recommendation:  Discharge Recommendation: Home with home health OT, Home with home health PT  OT Therapy Recommendation: 5-6 days/week, 60-120 mins/day, 1:1 treatment, Group therapy  OT Estimated Length of Stay: 14 days

## 2022-01-22 MED ORDER — BISACODYL 10 MG RE SUPP
10.0000 mg | Freq: Once | RECTAL | Status: DC
Start: 2022-01-22 — End: 2022-01-26
  Filled 2022-01-22 (×2): qty 1

## 2022-01-22 NOTE — Progress Notes (Signed)
[facility] Physiatry Attending Face to Face Progress  Note  [X] Discussed with nurse  .[x] No new events    Subjective:  Feels well;  c/o constipation.     No headache. No shortness of breath. No chest pain. No constipation.  Objective:  Vitals: BP 114/70   Pulse (!) 103   Temp 98.6 F (37 C) (Oral)   Resp 16   Ht 1.651 m (5\' 5" )   Wt 58.6 kg (129 lb 3 oz)   LMP  (LMP Unknown)   SpO2 99%   BMI 21.50 kg/m   Appears well.In no apparent distress.Resting comfortably   Awake ; normal mood and affect.Sclerae anicteric.   Moist mucous membranes.   No respiratory distress .        New labs   Results       ** No results found for the last 24 hours. **            Medications:     Current Facility-Administered Medications   Medication Dose Route Frequency    apixaban  5 mg Oral Q12H SCH    bisacodyl  10 mg Rectal Once    budesonide-formoterol  2 puff Inhalation BID    ferrous sulfate  324 mg Oral QAM W/BREAKFAST    lidocaine  1 patch Transdermal Q24H    lurasidone  120 mg Oral QPM    metoprolol succinate  125 mg Oral QHS    midodrine  7.5 mg Oral BID Meals    pantoprazole  40 mg Oral QAM AC    polyethylene glycol  17 g Oral Daily    QUEtiapine  100 mg Oral QHS    rOPINIRole  1 mg Oral QHS    senna-docusate  2 tablet Oral QHS    tamsulosin  0.4 mg Oral Daily after dinner    thiamine  100 mg Oral Daily    vitamin B-6  50 mg Oral Daily    vitamin D  25 mcg Oral Daily    vitamins/minerals  1 tablet Oral Daily at 1700    zinc sulfate  220 mg Oral Daily at 1700       Medication Review  A complete drug regimen review was completed: YES  Were drug issues were found during review: NO   If yes to drug issues found during review:  What was the issue?:   What was the time the issue was identified?:   Was I contacted and action was taken by midnight of the next calendar day once issue was identified?:   Person who contacted me:   Action taken:       Assessment/Plan: 66 y.o. female  with dysfunction of mobility/ ADL due to  Myositis  Continue comprehensive intensive inpatient rehab program. Medically stable. Continue current management.     Dulcolax suppository tonight.     At least 15 minutes was spent with the patient in total.     Letta Moynahan, MD  PM&R

## 2022-01-22 NOTE — Plan of Care (Signed)
Problem: Bladder Management  Goal: LTG: Pt will be 100% continent and independent with bladder elim at [place]  Outcome: Progressing     Problem: Cardiac Function  Goal: LTG: Pt will be independent with BP and pulse management and medication at [place]  Outcome: Progressing     Problem: Pain Management  Goal: LTG: Pt will achieve good pain control with current medication regimen as evidenced by ability to participate in 100% of therapy sessions.  Outcome: Progressing     Problem: Safety Risk  Goal: LTG: Pt will recognize limitations and use call bell 100% of the time prior to transfers in order to prevent fsalls  Outcome: Progressing     Problem: Skin Wound Management  Goal: LTG: Excoriation at buttocks will resolve by [place]  Outcome: Progressing     Problem: Mobility  Goal: LTG: Patient will ambulate >150 feet at a Mod I level and navigate 1 FOS with CGA, both with LRAD in order to access the home and community environments.   Outcome: Progressing

## 2022-01-23 MED ORDER — MIDODRINE HCL 5 MG PO TABS
10.0000 mg | ORAL_TABLET | Freq: Two times a day (BID) | ORAL | Status: DC
Start: 2022-01-24 — End: 2022-01-23

## 2022-01-23 MED ORDER — MIDODRINE HCL 5 MG PO TABS
10.0000 mg | ORAL_TABLET | Freq: Two times a day (BID) | ORAL | Status: DC
Start: 2022-01-23 — End: 2022-01-26
  Administered 2022-01-23 – 2022-01-26 (×7): 10 mg via ORAL
  Filled 2022-01-23 (×6): qty 2

## 2022-01-23 NOTE — Plan of Care (Signed)
Problem: Cardiac Function  Goal: LTG: Pt will be independent with BP and pulse management and medication at [place]  Outcome: Progressing     Problem: Pain Management  Goal: LTG: Pt will achieve good pain control with current medication regimen as evidenced by ability to participate in 100% of therapy sessions.  Outcome: Progressing     Problem: Safety Risk  Goal: LTG: Pt will recognize limitations and use call bell 100% of the time prior to transfers in order to prevent fsalls  Outcome: Progressing     Problem: Skin Wound Management  Goal: LTG: Excoriation at buttocks will resolve by [place]  Outcome: Progressing

## 2022-01-23 NOTE — SLP Progress Note (Addendum)
Speech Language Pathology  Inpatient Rehabilitation Daily Progress Note    Patient Name:  [name]       Medical Record Number: [id number]   Date of Birth: 10-21-1956  Sex: Female          Room/Bed:  M501/M501-02    Therapy Received:  Start Time: 1000   Stop Time: 1100  Total Therapy Minutes: 60    Rehabilitation Precautions/Restrictions:  Weight Bearing Status: no restrictions  Precaution Instructions Given to Patient: Yes  Restricted BP Precautions: other (comment)  Communication Precautions: Speak slowly  Other Precautions: falls, decreased LE strength/sensation, orthostatic hypotension, ABD binder, TED hose and ACE wraps as needed    Rehab Diagnosis: 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]  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 Patient recalled items served for breakfast and recorded on "Meal Intake Form." Patient encouraged to do the same for lunch/dinner from 4/1 and to do the same for 4/2 to be checked by next clinician.      Speech Intelligibility + Memory  Patient and clinician reviewed speech intelligibility strategies (reduced rate of speech and over-articulation) with practice at the paragraph level. Patient reading passages aloud with occasional cues for reduced rate, however patient self correcting errors before clinician would say anything. Patient was judged to be 90-100% intelligible. Patient asked to recall 1-2 details from the passages targeting immediate recall of information with difficulty. Patient recalling vague information and required to read back again for detailed information. Patient fatigued at the end of the session with noted decreased vocal volume and decreased articulatory precision. Patient said: "I'm tired" jokingly. Patient completing 8 paragraphs with recall of 8 details with  assistance.     Education Documentation  Compensatory cognitive strategies/aids, taught by Theodoro Kalata, SLP at 01/23/2022 12:56 PM.  Learner: Patient  Readiness: Acceptance  Method: Explanation  Response: Verbalizes Understanding    Communication strategies, taught by Theodoro Kalata, SLP at 01/23/2022 12:56 PM.  Learner: Patient  Readiness: Acceptance  Method: Explanation  Response: Verbalizes Understanding    Cognitive function, taught by Theodoro Kalata, SLP at 01/23/2022 12:56 PM.  Learner: Patient  Readiness: Acceptance  Method: Explanation  Response: Verbalizes Understanding    Education Comments  No comments found.        Assessment & Plan   Patient's awareness of impairments continues to be emerging with self correction of errors and willingness to complete memory assignment for next therapy session. Patient would benefit from continued work to establish a compensatory strategy she would feel most comfortable using independently (I.e. note taking in one spot, cell phone reminders, etc). Patient would also benefit from continued practice with use of speech intelligibility strategies      Plan:  Risks/Benefits/POC Discussed with Pt/Family: With patient  Treatment/Interventions: Speech/Language treatment, Cognitive linguistic retraining    Recommendation:  Discharge Recommendation: SNF  SLP Therapy Recommendation: 5-6 days/week, 60-120 mins/day, 1:1 treatment  SLP Estimated Length of Stay: March 4    Swallow Recommendations:   Diet Solids Recommendation: regular  Diet Liquids Recommendations: thin consistency  Precautions/Compensations: Awake/alert, Upright 90 degrees for all oral intake  Recommended Form of Meds: PO, whole, with liquid

## 2022-01-23 NOTE — PT Progress Note (Signed)
Physical Therapy  Inpatient Rehabilitation Daily Progress Note    Patient Name:  [name]       Medical Record Number: [id number]   Date of Birth: [birth year]  Sex: Female          Room/Bed:  M501/M501-02    Therapy Received:  Start Time: 0900   Stop Time: 1000  Total Therapy Minutes: 60    Rehabilitation Precautions/Restrictions:  Weight Bearing Status: no restrictions  Precaution Instructions Given to Patient: Yes  Restricted BP Precautions: other (comment) (ACE wraps, abdominal binder)  Communication Precautions: Speak slowly  Other Precautions: falls, decreased LE strength/sensation, orthostatic hypotension, ABD binder, TED hose and ACE wraps as needed    Rehab Diagnosis: 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]  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    Patient/Caregiver Goals: I want to be independent  Pain Assessment:  Pain Assessment: No/denies pain    Objective   Vitals:  Heart Rate: (!) 113  BP: 90/58  BP Location: Left arm  BP Method: Automatic  MAP (mmHg): 69  Patient Position: Lying    Interventions: Pt received seated in wheelchair with RN present administering Gabapentin due to reports of back discomfort. PT to notice no BP management garments and immediately checked vitals, 79/50 mmHg. PT to educate Pt on the importance to advocate for nursing to check vitals when in upright position. Pt with reports of feeling dizzy, 5/10 on dizziness scale.  PT to don abdominal binder, recline Pt, encourage fluids, and elevated BLE. Vitals check again, 91/57 mmHg. PT to notify nurse and encourage double checking communication board with reports of TED hose and abdominal binder donned.  RN notified covering MD of low BP. Pt agreeable to participate in therapies after contacting her family to ensure they were arriving at a later time. Pt dependently transported to [facility] gym. Pt completed squat pivot transfer to/from [place], MinA. Pt  instructed in [place] recumbent bike, targeted time of 12 minutes, Pt able to complete 4 minutes with SPM>35 at level , pt reports of feeling "off", vitals checked 90/59 mmHg.  Pt then with reports of wanting to return to bed and no longer wanting to participate in therapy. Pt encourage to drink fluids, vitals checked again 89/55 mmHg.Pt transported to room, PT to don B TED hose due to increase in drop in BP with activity. Pt instructed in wheelchair management ( brakes and leg rest), to prepare for wheelchair to bed transfer, Pt grossly required SBA. Pt instructed in squat pivot transfer from wheelchair to bed, attempted CGA, however Pt required Min A to navigate hips. Sit to supine, Sup.     Education Documentation  Wheelchair management/mobility, taught by Garrison Columbus, PT at 01/23/2022  9:00 AM.  Learner: Patient  Readiness: Acceptance  Method: Explanation  Response: Verbalizes Understanding    Safety issues and interventions, taught by Garrison Columbus, PT at 01/23/2022  9:00 AM.  Learner: Patient  Readiness: Acceptance  Method: Explanation  Response: Verbalizes Understanding    Rehab techniques/procedure, taught by Garrison Columbus, PT at 01/23/2022  9:00 AM.  Learner: Patient  Readiness: Acceptance  Method: Explanation  Response: Verbalizes Understanding    Precautions, taught by Garrison Columbus, PT at 01/23/2022  9:00 AM.  Learner: Patient  Readiness: Acceptance  Method: Explanation  Response: Verbalizes Understanding    Plan of care, taught by Garrison Columbus, PT at 01/23/2022  9:00  AM.  Learner: Patient  Readiness: Acceptance  Method: Explanation  Response: Verbalizes Understanding    Functional transfers/mobility, taught by Garrison Columbus, PT at 01/23/2022  9:00 AM.  Learner: Patient  Readiness: Acceptance  Method: Explanation  Response: [name] Understanding    Fall prevention/balance training, taught by Garrison Columbus, PT at 01/23/2022  9:00 AM.  Learner: Patient  Readiness: Acceptance  Method:  Explanation  Response: Verbalizes Understanding    Education Comments  No comments found.         Assessment & Plan   Pt did not tolerate session well today due to reports of dizziness and poor hemodynamic stability. However, pt with noted decrease assistance for transfers and will likely be able to complete lateral scoots with armrest removed with Sup.  Pt also demonstrates self limiting characteristics that directly impacts her participation in therapies. Pt will continue to benefit from further medical management education, transfer training, and wheelchair management/propulsion. Continue PT per POC.     Plan:  Risks/Benefits/POC Discussed with Pt/Family: With patient  Treatment/Interventions: Gait training, Exercise, Stair training, Neuromuscular re-education, Functional transfer training, LE strengthening/ROM, Endurance training, Patient/family training, Equipment eval/education, Bed mobility, Compensatory technique education, Continued evaluation    Recommendation:  Discharge Recommendation: Home with home health PT, SNF (will continue to assess)  PT Therapy Recommendation: 5-6 days/week, 60-120 mins/day, 1:1 treatment  PT Estimated Length of Stay: 10-14 days from IE on 01/04/2022

## 2022-01-23 NOTE — OT Progress Note (Signed)
Occupational Therapy  Inpatient Rehabilitation Daily Progress Note    Patient Name:  [name]       Medical Record Number: [id number]   Date of Birth: January 16, 1956  Sex: Female          Room/Bed:  M501/M501-02    Therapy Received:  Start Time: 1300   Stop Time: 1330  Total Therapy Minutes: 30    Rehabilitation Precautions/Restrictions:  Weight Bearing Status: no restrictions  Precaution Instructions Given to Patient: Yes  Restricted BP Precautions: other (comment)  Communication Precautions: Speak slowly  Other Precautions: falls, decreased LE strength/sensation, orthostatic hypotension, ABD binder, TED hose and ACE wraps as needed    Rehab Diagnosis: 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]  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]     Subjective   Patient Report: "I don't normally say this in therapy, but I just can't handle it."  Patient/Caregiver Goals: I want to be independent  Pain Assessment:  Pain Assessment: Numeric Scale (0-10)  Pain Score: 7-severe pain  Pain Location: Back  Pain Orientation: Lower  Pain Descriptors: Constant  Effect of Pain on Daily Activities: mild  Pain Intervention(s): Medication (See eMAR), Repositioned, Environmental changes, Emotional support    Objective   Vitals:  Prior to activity  BP: 105/68  HR: 111    During activity  Heart Rate: (!) 118  BP: 94/61  BP Location: Right arm  BP Method: Automatic  MAP (mmHg): 72  Patient Position: Sitting    Interventions: Appreciate direct handoff from RN in room, providing medication, cleared for therapy. Pt met semi-supine in bed, +B/L TEDS, requesting to skip session 2/2 family planning to visit. Pt educated on therapy schedule and importance of participation; pt  agreeable. Donned abdominal binder prior to transitioning to w/c. Pt completed squat pivot transfer to chair SPV. Pt was taken to therapy gym and engaged in STS/standing tolerance activities. Pt completed x2 STS transfers in prep for standing c CGA at raised mat table. Engaged pt in taking 1 UE off RW to  encourage unilateral support in order to participate in coloring activity. Pt reporting feeling dizzy and as if she were about to faint; BP decreased slightly (see vitals above). Attempted for pt to recall activities for BP management; pt only reporting "rest". Educated on BLE exercises and completed seated marches, LAQs and ankle pumps. Pt then engaged in small marches while standing, however again reporting feeling like she will faint. OT provided emotional support and encouragement, educating on BP only slightly low and activity will help to manage OH going forward. Pt c significant fear of fainting, reporting "I don't normally do this but I can't handle therapy today" and "I almost fainted 3 times this morning." OT encouraged pt that she did not faint this AM. Pt also upset about having schedule today, as she was told she would have the weekend off. OT educated pt on missed minutes from earlier in the week; pt adamant about not having missed any time despite documented missed minutes in pt's chart. Returned pt to room and transferred back to bed.       Education Documentation  Muscle weakness, taught by Penelope Coop, OT at 01/23/2022  3:22 PM.  Learner: Patient  Readiness: Acceptance  Method: Explanation  Response: [name] Understanding, Needs Reinforcement    Reconditioning, taught by Penelope Coop, OT at 01/23/2022  3:22 PM.  Learner: Patient  Readiness: Acceptance  Method: Explanation  Response: Verbalizes Understanding, Needs Reinforcement    Signs/symptoms associated with debility, taught by Penelope Coop, OT  at 01/23/2022  3:22 PM.  Learner: Patient  Readiness: Acceptance  Method: Explanation  Response: Trenton GammonVerbalizes Understanding, Needs Reinforcement    Safety issues and interventions, taught by Penelope CoopKraska, Omair Dettmer, OT at 01/23/2022  3:22 PM.  Learner: Patient  Readiness: Acceptance  Method: Explanation  Response: Verbalizes Understanding, Needs Reinforcement    Rehab techniques/procedure, taught by Penelope CoopKraska, Jaleen Grupp, OT  at 01/23/2022  3:22 PM.  Learner: Patient  Readiness: Acceptance  Method: Explanation  Response: Verbalizes Understanding, Needs Reinforcement    Precautions, taught by Penelope CoopKraska, Doron Shake, OT at 01/23/2022  3:22 PM.  Learner: Patient  Readiness: Acceptance  Method: Explanation  Response: Verbalizes Understanding, Needs Reinforcement    Plan of care, taught by Penelope CoopKraska, Airiana Elman, OT at 01/23/2022  3:22 PM.  Learner: Patient  Readiness: Acceptance  Method: Explanation  Response: Verbalizes Understanding, Needs Reinforcement    Functional transfers/mobility, taught by Penelope CoopKraska, Ellanie Oppedisano, OT at 01/23/2022  3:22 PM.  Learner: Patient  Readiness: Acceptance  Method: Explanation  Response: Verbalizes Understanding, Needs Reinforcement    Fall prevention/balance training, taught by Penelope CoopKraska, Saivion Goettel, OT at 01/23/2022  3:22 PM.  Learner: Patient  Readiness: Acceptance  Method: Explanation  Response: Verbalizes Understanding, Needs Reinforcement    Education Comments  No comments found.        Assessment & Plan   Pt unable to participate in full hour this session due to self-limiting behaviors directly influencing activity tolerance and willingness to participate. Pt demo's significant fear of fainting, despite all compression garments donned and SBP in mid 90's. Continue c OT POC, educating on debility and OH management and activity tolerance.    Plan:  Risks/Benefits/POC Discussed with Pt/Family: With patient  Treatment Interventions: ADL retraining, Functional transfer training, UE strengthening/ROM, Endurance training, Cognitive reorientation, Patient/Family training, Equipment eval/education, Neuro muscular reeducation, Fine motor coordination activities, Compensatory technique education    Recommendation:  Discharge Recommendation: Home with home health OT, Home with home health PT  OT Therapy Recommendation: 5-6 days/week, 60-120 mins/day, 1:1 treatment, Group therapy  OT Estimated Length of Stay: 14 days

## 2022-01-23 NOTE — Plan of Care (Signed)
Patient had a very large BM this morning after getting lactulose last night. Pain well controlled with diluadid and gabapentin PRN. Her BP this AM dropped even after she got her midrodene. She felt dizzy with the episode. Her midrodene dose was increased and TED stockings were applied. She has mostly been sinus tachy this shift as well; MD aware of both BP and HR.    After seeing that several times this week she has required and in and out cath for retention, a foley was requested by the MD to be reinserted. Used a latex free foley cath. Corrie DandyMary RN was the witness as well as some students (patient agreed). She tolerated it well. Will continue to monitor.       Problem: Bladder Management  Goal: LTG: Pt will be 100% continent and independent with bladder elim at [place]  Outcome: Progressing     Problem: Cardiac Function  Goal: LTG: Pt will be independent with BP and pulse management and medication at [place]  Outcome: Progressing     Problem: Pain Management  Goal: LTG: Pt will achieve good pain control with current medication regimen as evidenced by ability to participate in 100% of therapy sessions.  Outcome: Progressing     Problem: Safety Risk  Goal: LTG: Pt will recognize limitations and use call bell 100% of the time prior to transfers in order to prevent fsalls  Outcome: Progressing     Problem: Skin Wound Management  Goal: LTG: Excoriation at buttocks will resolve by [place]  Outcome: Progressing     Problem: Skin Wound Management  Goal: LTG: Excoriation at buttocks will resolve by [place]  Outcome: Progressing     Problem: Mobility  Goal: LTG: Patient will ambulate >150 feet at a Mod I level and navigate 1 FOS with CGA, both with LRAD in order to access the home and community environments.   Outcome: Progressing     Problem: Self Care Management  Goal: LTG: Pt will perform ADL routine at spv level with use of compensatory strategies and AE prn upon d/c.   Outcome: Progressing     Problem: Communication  Goal: Patient will be able  to communicate wants/needs regarding POC utilizing compenstatory speech strategies at the discourse level with caregiver support as indicated.  Outcome: Progressing     Problem: Compromised Tissue integrity  Goal: Damaged tissue is healing and protected  Outcome: Progressing  Flowsheets (Taken 01/10/2022 1940 by Kerry FortLee, Denise C., RN)  Damaged tissue is healing and protected:   Monitor/assess Braden scale every shift   Reposition patient every 2 hours and as needed unless able to reposition self   Increase activity as tolerated/progressive mobility   Relieve pressure to bony prominences for patients at moderate and high risk   Avoid shearing injuries   Keep intact skin clean and dry   Use bath wipes, not soap and water, for daily bathing   Use incontinence wipes for cleaning urine, stool and caustic drainage. Foley care as needed   Monitor external devices/tubes for correct placement to prevent pressure, friction and shearing   Encourage use of lotion/moisturizer on skin   Monitor patient's hygiene practices   Utilize specialty bed  Goal: Nutritional status is improving  Outcome: Progressing  Flowsheets (Taken 01/21/2022 2234 by Malka SoBekele, Ayda, RN)  Nutritional status is improving:   Allow adequate time for meals   Include patient/patient care companion in decisions related to nutrition     Problem: Hemodynamic Status: Cardiac  Goal: Stable vital signs and fluid  balance  Outcome: Progressing  Flowsheets (Taken 01/04/2022 1751 by Jenene Slicker A, RN)  Stable vital signs and fluid balance:   Monitor/assess vital signs and telemetry per unit protocol   Assess signs and symptoms associated with cardiac rhythm changes   Monitor intake/output per unit protocol and/or LIP order   Monitor for leg swelling/edema and report to LIP if abnormal   Monitor lab values   Weigh on admission and record weight daily     Problem: Inadequate Tissue Perfusion  Goal: Adequate tissue perfusion will be maintained  Outcome:  Progressing  Flowsheets (Taken 01/04/2022 1751 by Jenene Slicker A, RN)  Adequate tissue perfusion will be maintained:   Monitor/assess vital signs   Monitor/assess lab values and report abnormal values   Monitor/assess neurovascular status (pulses, capillary refill, pain, paresthesia, paralysis, presence of edema)   Monitor intake and output   Monitor for signs and symptoms of a pulmonary embolism (dyspnea, tachypnea, tachycardia, confusion)   Encourage/assist patient as needed to turn, cough, and perform deep breathing every 2 hours   Reinforce use of ordered respiratory interventions (i.e. CPAP, BiPAP, Incentive Spirometer, Acapella, etc.)     Problem: Nutrition  Goal: Nutritional intake is adequate  Outcome: Progressing  Flowsheets (Taken 01/21/2022 2234 by Malka So, RN)  Nutritional intake is adequate:   Assist patient with meals/food selection   Allow adequate time for meals   Encourage/perform oral hygiene as appropriate   Encourage/administer dietary supplements as ordered (i.e. tube feed, TPN, oral, OGT/NGT, supplements)   Consult/collaborate with Clinical Nutritionist   Include patient/patient care companion in decisions related to nutrition   Assess anorexia, appetite, and amount of meal/food tolerated   Monitor daily weights

## 2022-01-23 NOTE — Progress Notes (Signed)
[facility] Physiatry Attending Face to Face Progress  Note  [X] Discussed with nurse  .[x] No new events    Subjective:  Feels well;  c/o constipation.     No headache. No shortness of breath. No chest pain. No constipation.    Per nursing and PT patient continues to be significantly orthostatic this morning with dizziness and light headedness despite midodrine. Also continues to be SC, nursing requesting foley.     Objective:  Vitals: BP 105/68   Pulse (!) 111   Temp 98.8 F (37.1 C) (Oral)   Resp 16   Ht 1.651 m (5\' 5" )   Wt 58.6 kg (129 lb 3 oz)   LMP  (LMP Unknown)   SpO2 100%   BMI 21.50 kg/m   Appears well.In no apparent distress.Resting comfortably   Awake ; normal mood and affect.Sclerae anicteric.   Moist mucous membranes.   No respiratory distress .        New labs   Results       ** No results found for the last 24 hours. **            Medications:     Current Facility-Administered Medications   Medication Dose Route Frequency    apixaban  5 mg Oral Q12H SCH    bisacodyl  10 mg Rectal Once    budesonide-formoterol  2 puff Inhalation BID    ferrous sulfate  324 mg Oral QAM W/BREAKFAST    lidocaine  1 patch Transdermal Q24H    lurasidone  120 mg Oral QPM    metoprolol succinate  125 mg Oral QHS    midodrine  10 mg Oral BID Meals    pantoprazole  40 mg Oral QAM AC    polyethylene glycol  17 g Oral Daily    QUEtiapine  100 mg Oral QHS    rOPINIRole  1 mg Oral QHS    senna-docusate  2 tablet Oral QHS    tamsulosin  0.4 mg Oral Daily after dinner    thiamine  100 mg Oral Daily    vitamin B-6  50 mg Oral Daily    vitamin D  25 mcg Oral Daily    vitamins/minerals  1 tablet Oral Daily at 1700    zinc sulfate  220 mg Oral Daily at 1700       Medication Review  A complete drug regimen review was completed: YES  Were drug issues were found during review: NO   If yes to drug issues found during review:  What was the issue?:   What was the time the issue was identified?:   Was I contacted and action was taken by  midnight of the next calendar day once issue was identified?:   Person who contacted me:   Action taken:       Assessment/Plan: 66 y.o. 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 Medically stable. Continue current management.     Midodrine increased dose, also ordered urinary indwelling catheter for urinary retention, start voiding trail at a later time.     At least 30 minutes was spent with the patient in total.     Letta Moynahan, MD  PM&R

## 2022-01-24 DIAGNOSIS — M6009 Infective myositis, multiple sites: Secondary | ICD-10-CM

## 2022-01-24 DIAGNOSIS — R079 Chest pain, unspecified: Secondary | ICD-10-CM

## 2022-01-24 LAB — BASIC METABOLIC PANEL
Anion Gap: 7 (ref 5.0–15.0)
BUN: 5 mg/dL — ABNORMAL LOW (ref 7.0–21.0)
CO2: 24 mEq/L (ref 17–29)
Calcium: 8.8 mg/dL (ref 8.5–10.5)
Chloride: 110 mEq/L (ref 99–111)
Creatinine: 0.5 mg/dL (ref 0.4–1.0)
Glucose: 104 mg/dL — ABNORMAL HIGH (ref 70–100)
Potassium: 3.9 mEq/L (ref 3.5–5.3)
Sodium: 141 mEq/L (ref 135–145)

## 2022-01-24 LAB — CBC
Absolute NRBC: 0 10*3/uL (ref 0.00–0.00)
Hematocrit: 26.9 % — ABNORMAL LOW (ref 34.7–43.7)
Hgb: 8.5 g/dL — ABNORMAL LOW (ref 11.4–14.8)
MCH: 26 pg (ref 25.1–33.5)
MCHC: 31.6 g/dL (ref 31.5–35.8)
MCV: 82.3 fL (ref 78.0–96.0)
MPV: 9 fL (ref 8.9–12.5)
Nucleated RBC: 0 /100 WBC (ref 0.0–0.0)
Platelets: 356 10*3/uL — ABNORMAL HIGH (ref 142–346)
RBC: 3.27 10*6/uL — ABNORMAL LOW (ref 3.90–5.10)
RDW: 18 % — ABNORMAL HIGH (ref 11–15)
WBC: 6.88 10*3/uL (ref 3.10–9.50)

## 2022-01-24 LAB — ECG 12-LEAD
Atrial Rate: 98 {beats}/min
IHS MUSE NARRATIVE AND IMPRESSION: NORMAL
P Axis: 53 degrees
P-R Interval: 150 ms
Q-T Interval: 364 ms
QRS Duration: 90 ms
QTC Calculation (Bezet): 464 ms
R Axis: 44 degrees
T Axis: 31 degrees
Ventricular Rate: 98 {beats}/min

## 2022-01-24 LAB — HIGH SENSITIVITY TROPONIN-I: hs Troponin-I: 2.9 ng/L

## 2022-01-24 LAB — GFR: EGFR: >60

## 2022-01-24 NOTE — Plan of Care (Signed)
Problem: Bladder Management  Goal: LTG: Pt will be 100% continent and independent with bladder elim at [place]  Outcome: Progressing     Problem: Cardiac Function  Goal: LTG: Pt will be independent with BP and pulse management and medication at [place]  Outcome: Progressing     Problem: Pain Management  Goal: LTG: Pt will achieve good pain control with current medication regimen as evidenced by ability to participate in 100% of therapy sessions.  Outcome: Progressing     Problem: Safety Risk  Goal: LTG: Pt will recognize limitations and use call bell 100% of the time prior to transfers in order to prevent fsalls  Outcome: Progressing     Problem: Skin Wound Management  Goal: LTG: Excoriation at buttocks will resolve by [place]  Outcome: Progressing     Problem: Mobility  Goal: LTG: Patient will ambulate >150 feet at a Mod I level and navigate 1 FOS with CGA, both with LRAD in order to access the home and community environments.   Outcome: Progressing     Problem: Self Care Management  Goal: LTG: Pt will perform ADL routine at spv level with use of compensatory strategies and AE prn upon d/c.   Outcome: Progressing     Problem: Communication  Goal: Patient will be able to communicate wants/needs regarding POC utilizing compenstatory speech strategies at the discourse level with caregiver support as indicated.  Outcome: Progressing     Problem: Compromised Tissue integrity  Goal: Damaged tissue is healing and protected  Outcome: Progressing

## 2022-01-24 NOTE — Progress Notes (Signed)
PHYSICAL MEDICINE AND REHABILITATION  PROGRESS NOTE  FACE TO Bruce Donath    Date Time: 01/24/22 5:26 PM    Patient Name: [name],[name]  Admission date:  01/03/2022  (LOS: 21 days)    Subjective:     Patient has no new complaints. Denies pain. Tolerating therapies.   Case discussed with daughter over the phone     Functional Status:     Pt making little to no progress with PT due to anxiety, severe fear of falling, and self-limiting tendencies which place her at significantly increased risk of falling. Pt will benefit from [place] to SNF to continue to address strength and endurance impairments in order to reduce burden of care.     Medications:   Medication reviewed by me:     Scheduled Meds: PRN Meds:    apixaban, 5 mg, Oral, Q12H SCH  bisacodyl, 10 mg, Rectal, Once  budesonide-formoterol, 2 puff, Inhalation, BID  ferrous sulfate, 324 mg, Oral, QAM W/BREAKFAST  lidocaine, 1 patch, Transdermal, Q24H  lurasidone, 120 mg, Oral, QPM  metoprolol succinate, 125 mg, Oral, QHS  midodrine, 10 mg, Oral, BID Meals  pantoprazole, 40 mg, Oral, QAM AC  polyethylene glycol, 17 g, Oral, Daily  QUEtiapine, 100 mg, Oral, QHS  rOPINIRole, 1 mg, Oral, QHS  senna-docusate, 2 tablet, Oral, QHS  tamsulosin, 0.4 mg, Oral, Daily after dinner  thiamine, 100 mg, Oral, Daily  vitamin B-6, 50 mg, Oral, Daily  vitamin D, 25 mcg, Oral, Daily  vitamins/minerals, 1 tablet, Oral, Daily at 1700  zinc sulfate, 220 mg, Oral, Daily at 1700        Continuous Infusions:   acetaminophen, 650 mg, Q4H PRN  albuterol-ipratropium, 3 mL, Q6H PRN  bisacodyl, 10 mg, Daily PRN  bisacodyl, 10 mg, Daily PRN  diphenhydrAMINE, 25 mg, Q6H PRN  gabapentin, 100 mg, TID PRN  HYDROmorphone, 2 mg, Q6H PRN  lactulose, 20 g, Q4H PRN  LORazepam, 0.5 mg, QHS PRN  melatonin, 3 mg, QHS PRN  naloxone, 0.4 mg, PRN  promethazine, 25 mg, Q6H PRN          Medication Review  A complete drug regimen review was completed: Yes  Were drug issues were found during review: No   If yes to drug  issues found during review:  What was the issue?:   What was the time the issue was identified?:   Was I contacted and action was taken by midnight of the next calendar day once issue was identified?: N/A   Person who contacted me:   Action taken:     Review of Systems:   A comprehensive review of systems was: No fevers, chills, nausea, vomiting, chest pain, shortness of breath, cough, headache, double vision.  All others negative.    Labs:     Recent Labs   Lab 01/24/22  0628 01/20/22  0658   WBC 6.88 7.13   Hgb 8.5* 8.1*   Hematocrit 26.9* 25.5*   Platelets 356* 339      Recent Labs   Lab 01/24/22  0628 01/20/22  0658   Sodium 141 137   Potassium 3.9 3.6   Chloride 110 106   CO2 24 25   BUN 5.0* 3.0*   Creatinine 0.5 0.5   Calcium 8.8 8.5   Glucose 104* 96         Rads:   Radiological Procedure reviewed.  Radiology Results (24 Hour)       ** No results found for the last 24 hours. **  Physical Exam:     Vitals:    01/24/22 1100 01/24/22 1112 01/24/22 1151 01/24/22 1638   BP: 102/69 102/69 103/71 106/69   Pulse: (!) 118 (!) 118 (!) 119 (!) 115   Resp:       Temp:    98.2 F (36.8 C)   TempSrc:    Oral   SpO2:  99% 100% 94%   Weight:       Height:           Intake and Output Summary (Last 24 hours) at Date Time    Intake/Output Summary (Last 24 hours) at 01/24/2022 1726  Last data filed at 01/24/2022 1300  Gross per 24 hour   Intake 360 ml   Output 1151 ml   Net -791 ml     P.O.: 240 mL (01/24/22 1300)     Urine: 1 mL (01/23/22 2315)  Bladder Scan Volume (mL): 244 mL (01/23/22 1115)  Intermittent/Straight Cath (mL): 950 mL (01/23/22 0515)       Gen: Alert, NAD  Psych: Pleasant and cooperative  Skin: Pink, warm, no rashes  HEENT: Atraumatic, normocephalic,   Chest: symmetric bilateral chest rise and fall,   CV: Regular rate and rhythm, no murmurs  Resp: breath sounds equal throughout; non labored  Abd: non-distended, soft nontender  Extr: Pink, warm,  Neuro:  Fluent speech    Assessment and Plan:     [name]  is a 66 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   Occupational Therapy focused on improving strength, endurance, transfers, activities of daily living, and cognition for 60-120 mins/day, 5-6 days/week, for 14 days.     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%   Metoprolol,   losartan held due to hypotension     # COPD  Symbicort      # bipolar disorder  Latuda   Seroquel     # Pain Management   Daily monitoring   gabapentin  Acetaminophen, dilaudid PRN     #Bowel Management  constipation - Senna, Colace, Bisacodyl suppository PRN; hold for loose stools    #Bladder Management  Check PVRs    #Skin Management  Frequent skin assessment  Moisture barrier PRN    #FEN/GI with severe malnutrition   cardiac diet    #DVT PPx  eliquis    #Dispo  Pending rehab progress      Continue comprehensive and intensive inpatient rehab program, including:   Physical therapy 60-120 min daily, 5-6 times per week, Occupational therapy 60-120 min daily, 5-6 times per week, Case management and Rehabilitation nursing.  Psychology services to evaluate and treat as needed     Signed by: Cheryll Dessert, MD   01/24/2022, 5:26 PM    [facility]

## 2022-01-24 NOTE — Progress Notes (Addendum)
Case Management  Inpatient Rehabilitation Progress Note    9:07AM  Pt new to this CM's census today. Reviewed CM handoff. Likely dispo SNF. CM reached out to [facility] w/ SNF admissions; auth pending.     CM reviewed chart; 7/10 chest pain per nursing note this morning.     10:53AM  Per attending should likely get med cx tomorrow v TBD.     CM uploaded current clinicals in Careport/[employer] for SNF.     11:10AM  CM Melissa completed the [facility] req copy. CM faxed it.     3:28PM  Met w/ pt at bedside; updated white board w/ this CM's info.     Updated pt of likely d/c tomorrow pending the insurance auth w/ [employer].     Dispo: [facility] pending [employer]. Auth initiated Fri 3/31.       Rico Junker, MSW, LMSW, ACM-SW  Social Worker Case Manager II  Acute Rehab  [place] [facility]  [address]  [place], [place] [address]  [phone]  Spectra [phone]

## 2022-01-24 NOTE — OT Progress Note (Signed)
Occupational Therapy  Inpatient Rehabilitation Daily Progress Note    Patient Name:  Mesa Janus       Medical Record Number: [id number]   Date of Birth: 1956/05/27  Sex: Female          Room/Bed:  M501/M501-02    Therapy Received:  Start Time: 1000   Stop Time: 1100  Total Therapy Minutes: 60    Rehabilitation Precautions/Restrictions:  Weight Bearing Status: no restrictions  Precaution Instructions Given to Patient: Yes  Restricted BP Precautions: other (comment)  Communication Precautions: Speak slowly  Other Precautions: falls, decreased LE strength/sensation, orthostatic hypotension, ABD binder, TED hose and ACE wraps as needed    Rehab Diagnosis: 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 She donned socks IND and OT donned abdominal binder in prep for OOB activities. She completed stand pivot t/f using RW with VC for technique/sequence and grossly CGA.     Began session with 5 min meditation to decrease patient's anxiety prior to standing activities.     She then practiced standing tolerance/balance activity via simulated pant management and functional reach with unilateral UE support. On first trial, patient experiencing high anxiety after about 40s of standing going into retropulsion, unable to recover, and requiring max A for slow descent to chair.     She tried additional 3 trials and was able to stand for approx 1 min each, safely sitting properly with multimodal cues for proper sit.     Rest of session patient used BLE to propel wheelchair approx 50 ft total with rest breaks as needed.     Neuro Re-Ed  Balance Training: Standing reaching  activities, Standing weight shifting all planes    Education Documentation  Wheelchair management/mobility, taught by Davonna Belling, OT at 01/24/2022 12:21 PM.  Learner: Patient  Readiness: Eager  Method: Explanation, Demonstration  Response: Needs Reinforcement, Verbalizes Understanding, No Evidence of Learning    Safety issues and interventions, taught by Davonna Belling, OT at 01/24/2022 12:21 PM.  Learner: Patient  Readiness: Eager  Method: Explanation, Demonstration  Response: Needs Reinforcement, Verbalizes Understanding, No Evidence of Learning    Precautions, taught by Davonna Belling, OT at 01/24/2022 12:21 PM.  Learner: Patient  Readiness: Eager  Method: Explanation, Demonstration  Response: Needs Reinforcement, Verbalizes Understanding, No Evidence of Learning    Functional transfers/mobility, taught by Davonna Belling, OT at 01/24/2022 12:21 PM.  Learner: Patient  Readiness: Eager  Method: Explanation, Demonstration  Response: Needs Reinforcement, Verbalizes Understanding, No Evidence of Learning    Fall prevention/balance training, taught by Davonna Belling, OT at 01/24/2022 12:21 PM.  Learner: Patient  Readiness: Eager  Method: Explanation, Demonstration  Response: Needs Reinforcement, Verbalizes Understanding, No Evidence of Learning    ADL retraining, taught by Davonna Belling, OT at 01/24/2022 12:21 PM.  Learner: Patient  Readiness: Eager  Method: Explanation, Demonstration  Response: Needs Reinforcement, Verbalizes Understanding, No Evidence of Learning    Education Comments  No comments found.      Assessment & Plan   Patient's high anxiety makes functional mobility dangerous for both the  patient and the assisting therapist due to severe retropulsion upon anxiety onset. Maintain safe environment that allows patient to immediately sit when completing functional mobility training. Continue to focus on strengthening, standing tolerance, and hemodynamic stability in prep for d/c to  SNF.    Plan:  Risks/Benefits/POC Discussed with Pt/Family: With patient  Treatment Interventions: ADL retraining, Functional transfer training, UE strengthening/ROM, Endurance training, Cognitive reorientation, Patient/Family training, Equipment eval/education, Neuro muscular reeducation, Fine motor coordination activities, Compensatory technique education    Recommendation:  Discharge Recommendation: SNF  OT Therapy Recommendation: 5-6 days/week, 60-120 mins/day, 1:1 treatment, Group therapy  OT Estimated Length of Stay: 14 days

## 2022-01-24 NOTE — Progress Notes (Signed)
MEDICINE PROGRESS NOTE  [facility], DIVISION OF HOSPITALIST MEDICINE   [facility]   [place] Pager: [phone]      Date Time: 01/24/22 2:45 PM  Patient Name: [name],[name]  Attending Physician: Anastasia PallPerez Gonzalez, Edson SnowballCarlos [facility]  Hospital Day: 22  Assessment:   66 year old female with past medical history significant for diabetes, hypertension, nonischemic cardiomyopathy, paroxysmal A-fib, anemia, bipolar, TIAs presented [facility] on 2/7 with lower extremity weakness, paresthesias, transaminitis thought to have 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 Patient was discharged on 2/17 and went back to the hospital on 2/18 for continued management of rhabdomyolysis.  Patient noted to have left-sided weakness with associated difficulty with walking, standing, also developing bilateral hand and feet paresthesias and hand weakness,she was seen by neurology, work-up including MRIs, lumbar puncture done with no clear etiology,was [facility] for [facility].  While at [facility], first dose of IVIG was started,but apparently,she developed allergic reaction therefore IVIG was discontinued. Diagnosis at the time of discharge stayed unclear,DDx was possible statin induced myositis vs. inflammatory myositis vs. immune mediated myositis.  She was also noted to have multiple vitamin deficiencies including D, zinc, B6, iron deficiency which are being replaced.  Patient was discharged to acute rehab on 3/14.    Recommendations:   Myositis: Unclear etiology including possible statin induced myositis versus inflammatory arthritis versus immune mediated myositis  -[facility] unsuccessful due to development of allergic reaction  -Patient's access was removed - as the initial plan to plasma exchange was cancelled. 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 and less likely to suggest autoimmune process   -Multiple vitamin  deficiencies including vitamin D, zinc, B6, iron which are repleted  -Ongoing PT/OT per rehab protocol  -Avoid statins  -Negative paraneoplastic autoantibody from 3/7    Chest pain, atypical and reproducible, musculoskeletal, resolved  Troponin is normal  EKG shows chronic T wave inversion in lateral leads  Had cardiac cath in 2022 which was negative  Tylenol as needed    Chronic systolic heart failure, EF 43%  nonischemic cardiopathy  -Echo 12/31/2021 with EF 43%, mild global hypokinesis  -Noted per cardiology note 3/13 with no further plans for ischemic work-up as patient had cardiac catheterization in 2022 with no obstructive CAD  -outpatient follow-up for nonischemic cardiomyopathy work-up  -Repeat echo as outpatient  Follow-up with cardiology as an outpatient    Hyponatremia, resolved likely hypovolemia based on low urine Na and urine osmolality   -Sodium stable including a value of 137 on 3/29  -monitor BMP periodically     Improved metabolic acidosis  -HCO3 as low as 12 on 3/22 and improved at this time including labs 3/27    Vitamin D, B6, B1, zinc deficiencies  -Continue multivitamin supplement  -Continue thiamine(received IV replacement 3/4-3/9), vitamin B6, zinc supplements     Normocytic anemia  -Noted TSAT of 21%, ferritin 492, normal folate level on 3/10;  -normal vitamin B12 level on 3/4  -cont ferrous sulfate  -Monitor CBC periodically     Moderate protein calorie malnutrition  -Weight loss, was on Ozempic but discontinued  -c/w glucerna and regular diet    Paroxysmal A-fib  -currently in sinus with intermittent tachycardia  -cont metoprolol XL 125 mg in evening (increased from 100 mg on 3/17)  -Continue Eliquis  -Monitor heart rate    Orthostatic hypotension  -Responding to midodrine 5 mg in am and afternoon for now (continuing metoprolol at night with  tachycardia and PAF)  -c/w compression stocking and abd binder  -Initially on Flomax; may consider discontinuing if significant orthostatic  hypotension   Monitor blood pressure    Urinary retension  -Had Foley catheter, failed voiding trial on 3/13   -Monitor with voiding trial status post removal of Foley catheter 3/27 and monitor with voiding trial  -Continue Flomax for now, follow BP  -Bladder scan and straight cath PRN    COPD--Not in exacerbation   -continue Symbicort  -prn duonebs  -Chest x-ray was done 3/21-no acute changes     Bipolar disorder  -Continue home meds including Seroquel, ropinirole; home latuda 120mg  daily(pt has home med)     Type 2 diabetes mellitus  -Hemoglobin A1C 5.3% on 3/6  -Patient was on Ozempic but stopped due to malnutrition/weight loss  -No need for sliding-scale   -Continue diet control    H/o GERD  -c/w PPI    Constipation  -Continue bowel regimen    DVT ppx:on Eliquis as above    Discussed with patient and nurse    Subjective/ROS/24hr events:     CC: Myositis    HPI: Patient complained of chest pain earlier today, pain was behind her left breast, nonradiating, worse with changing position or movement and pressing on it.  Pain subsides by now  She denies shortness of breath    Physical Exam:     Temp:  [98.6 F (37 C)-98.8 F (37.1 C)] 98.7 F (37.1 C)  Heart Rate:  [95-125] 119  Resp Rate:  [16-18] 16  BP: (102-118)/(51-74) 103/71    Intake/Output Summary (Last 24 hours) at 01/24/2022 1445  Last data filed at 01/24/2022 1300  Gross per 24 hour   Intake 600 ml   Output 1411 ml   Net -811 ml      General: alert and awake, no distress  HEENT: anicteric, moist mucous membranes  Neck: supple, no JVD  CVS: nl S1, S2; no gallop or rub mild chest wall tenderness over costochondral joints  Lungs: clear to auscultation b/l, no wheezing or rhonchi; normal resp effort  Abd: soft, non-tender, non-distended  Ext: no pedal edema, no cyanosis  Neuro: alert and awake, moving all extremities with overall weakness in LE especially        Meds:   Medications were reviewed:  Scheduled Meds:  Current Facility-Administered Medications    Medication Dose Route Frequency    apixaban  5 mg Oral Q12H SCH    bisacodyl  10 mg Rectal Once    budesonide-formoterol  2 puff Inhalation BID    ferrous sulfate  324 mg Oral QAM W/BREAKFAST    lidocaine  1 patch Transdermal Q24H    lurasidone  120 mg Oral QPM    metoprolol succinate  125 mg Oral QHS    midodrine  10 mg Oral BID Meals    pantoprazole  40 mg Oral QAM AC    polyethylene glycol  17 g Oral Daily    QUEtiapine  100 mg Oral QHS    rOPINIRole  1 mg Oral QHS    senna-docusate  2 tablet Oral QHS    tamsulosin  0.4 mg Oral Daily after dinner    thiamine  100 mg Oral Daily    vitamin B-6  50 mg Oral Daily    vitamin D  25 mcg Oral Daily    vitamins/minerals  1 tablet Oral Daily at 1700    zinc sulfate  220 mg Oral Daily at 1700  Continuous Infusions:  PRN Meds:.acetaminophen, albuterol-ipratropium, bisacodyl, bisacodyl, diphenhydrAMINE, gabapentin, HYDROmorphone, lactulose, LORazepam, melatonin, naloxone, promethazine  Labs/Radiology:   Imaging personally reviewed, including: all available   No results found.  No results for input(s): GLUCOSEWB in the last 24 hours.  Recent Labs   Lab 01/24/22  0628 01/20/22  0658   Sodium 141 137   Potassium 3.9 3.6   Chloride 110 106   BUN 5.0* 3.0*   Creatinine 0.5 0.5   EGFR >60.0 >60.0   Glucose 104* 96   Calcium 8.8 8.5       Recent Labs   Lab 01/24/22  0628 01/20/22  0658   WBC 6.88 7.13   Hgb 8.5* 8.1*   Hematocrit 26.9* 25.5*   Platelets 356* 339           Signed by: Argentina Ponder, MD

## 2022-01-24 NOTE — PT Progress Note (Signed)
Physical Therapy  Inpatient Rehabilitation Weekly Progress Note    Patient Name:  [name]       Medical Record Number: [id number]   Date of Birth: 1956-06-01  Sex: Female          Room/Bed:  M501/M501-02    Therapy Received:  Start Time: 1100   Stop Time: 1200  Total Therapy Minutes: 60    Rehabilitation Precautions/Restrictions:  Weight Bearing Status: no restrictions  Precaution Instructions Given to Patient: Yes  Restricted BP Precautions: other (comment)  Communication Precautions: Speak slowly  Other Precautions: falls, decreased LE strength/sensation, orthostatic hypotension, ABD binder, TED hose and ACE wraps as needed    Rehab Diagnosis: 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 Session focused on [employer] mobility assessment as detailed below.     Pt engaged in gait training. Pt attempting to ambulate 10' to second chair. When turning, pt's RW becomes stuck on the leg rest and pt closes eyes, holds breath, and states she feels dizzy. Despite max encouragement and direct cues, pt begins to sit without chair behind her. Second person requested to assist (person was walking by in the hallway) and able to quickly place chair behind patient to avoid fall. Gait training terminated out of safety concerns due to pt's severe anxiety and tendency to place herself and therapist in unsafe position when gait training.     Mass practiced STS using RW, with max VC for forward trunk lean both when standing and sitting. Although pt can complete with CGA, she consistently hyper extends knees and pushes chair backwards as well as attempts to sit with tall trunk, placing her at  an increased fall risk when sitting.     Mobility Functional Status:   Current   Status Current Status Discharge Goal   Functional Area: Care Score:  Comments:    Roll Left and Right 4 Flat bed, use of handrails on bed Independent   Sit to Lying 4 HOB elevated Independent   Lying to Sitting on Side of Bed 4 HOB elevated Independent   Sit to Stand 4 RW Independent   Chair/Bed-to-Chair Transfer 4 RW Independent   Car Transfer 88 Safety concerns due to low BP Independent   Walk 10 Feet 3 Min A with RW, gait belt, close chair follow Independent   Walk 50 Feet with Two Turns 88    Independent   Walk 10 Feet on Uneven Surface 88    Independent   Walk 150 Feet 88    Independent   1 Step (Curb) 88    Supervision or touching assistance   4 Steps 88    Supervision or touching assistance   12 Steps 88    Supervision or touching assistance   Wheel 50 Feet with Two Turns 4 UE propulsion. Cues needed to avoid obstacles on ground Not applicable   Wheel 150 Feet 4 UE propulsion. Cues needed to avoid obstacles on ground Not applicable       Encounter Problems       Encounter Problems (Active)  PT - General and Outcome Measures       STG: Patient will tolerate Berg Balance Assessment (Progressing)       Start:  01/04/22    Expected End:  01/11/22            STG: Patient will tolerate (Progressing)       Start:  01/04/22    Expected End:  01/11/22            STG: Patient will tolerate (Progressing)       Start:  01/04/22    Expected End:  01/11/22            STG: Patient will tolerate entire therapy session OOB. (Completed)       Start:  01/04/22    Expected End:  01/11/22    Resolved:  01/17/22         STG: Patient will tolerate a stair, curb and ramp assessment.  (Progressing)       Start:  01/04/22    Expected End:  01/11/22               PT - General and Outcome Measures       LTG: Patient will complete transfers from bed <> chair with recommended DME, independent assistance in order to maximize independence and  prevent falls upon [place] home. (Progressing)       Start:  01/04/22            LTG: Patient will walk 150 ft with recommended DME, independent assistance in order to access areas of their home and community upon [place] home. (Progressing)       Start:  01/04/22            LTG: Patient will walk on uneven surfaces with recommended DME, independent assistance in order to access areas of their home and community upon  home. (Progressing)       Start:  01/04/22            LTG: Patient will ascend/descend 1 FOS with recommended DME, steadying assistance in order to safely and independently access their home/community (Progressing)       Start:  01/04/22            LTG: Patient/caregiver will return safe demonstration of proper supervision/assistance level for all mobility related needs in order to maximize safety upon transition to next level of care (Progressing)       Start:  01/04/22                   Education Documentation  Wheelchair management/mobility, taught by Doylene Canning, PT at 01/24/2022  1:00 PM.  Learner: Patient  Readiness: Acceptance  Method: Explanation  Response: Verbalizes Understanding, Needs Reinforcement    Safety issues and interventions, taught by Doylene Canning, PT at 01/24/2022  1:00 PM.  Learner: Patient  Readiness: Acceptance  Method: Explanation  Response: Verbalizes Understanding, Needs Reinforcement    Rehab techniques/procedure, taught by Doylene Canning, PT at 01/24/2022  1:00 PM.  Learner: Patient  Readiness: Acceptance  Method: Explanation  Response: Verbalizes Understanding, Needs Reinforcement    Precautions, taught by Doylene Canning, PT at 01/24/2022  1:00 PM.  Learner: Patient  Readiness: Acceptance  Method: Explanation  Response: Verbalizes Understanding, Needs Reinforcement    Plan of care, taught by Doylene Canning, PT at 01/24/2022  1:00 PM.  Learner: Patient  Readiness: Acceptance  Method: Explanation  Response: Verbalizes Understanding, Needs Reinforcement    Functional  transfers/mobility, taught by  Doylene CanningEveland, Yanina Knupp, PT at 01/24/2022  1:00 PM.  Learner: Patient  Readiness: Acceptance  Method: Explanation  Response: Trenton GammonVerbalizes Understanding, Needs Reinforcement    Fall prevention/balance training, taught by Doylene CanningEveland, Antigone Crowell, PT at 01/24/2022  1:00 PM.  Learner: Patient  Readiness: Acceptance  Method: Explanation  Response: Trenton GammonVerbalizes Understanding, Needs Reinforcement    Equipment, taught by Doylene CanningEveland, Zalman Hull, PT at 01/24/2022  1:00 PM.  Learner: Patient  Readiness: Acceptance  Method: Explanation  Response: Verbalizes Understanding, Needs Reinforcement    Education Comments  No comments found.        Assessment & Plan   Pt making little to no progress with PT due to anxiety, severe fear of falling, and self-limiting tendencies which place her at significantly increased risk of falling. Pt will benefit from [place] to SNF to continue to address strength and endurance impairments in order to reduce burden of care.     Plan:  Risks/Benefits/POC Discussed with Pt/Family: With patient  Treatment/Interventions: Gait training, Exercise, Stair training, Neuromuscular re-education, Functional transfer training, LE strengthening/ROM, Endurance training, Patient/family training, Equipment eval/education, Bed mobility, Compensatory technique education, Continued evaluation    Recommendation:  Discharge Recommendation: Home with home health PT, SNF (will continue to assess)  PT Therapy Recommendation: 5-6 days/week, 60-120 mins/day, 1:1 treatment  PT Estimated Length of Stay: 10-14 days from IE on 01/04/2022    Groups appropriate for patient:   Other (not group appropriate)

## 2022-01-24 NOTE — Plan of Care (Signed)
Problem: Bladder Management  Goal: LTG: Pt will be 100% continent and independent with bladder elim at [place]  Outcome: Progressing     Problem: Cardiac Function  Goal: LTG: Pt will be independent with BP and pulse management and medication at   Outcome: Progressing     Problem: Pain Management  Goal: LTG: Pt will achieve good pain control with current medication regimen as evidenced by ability to participate in 100% of therapy sessions.  Outcome: Progressing

## 2022-01-24 NOTE — SLP Progress Note (Addendum)
Speech Language Pathology  Inpatient Rehabilitation Discharge Summary    Patient Name:  [name]       Medical Record Number: [id number]   Date of Birth: 09-15-1956  Sex: Female          Room/Bed:  M501/M501-02    Therapy Received:  Start Time: 1400   Stop Time: 1500  Total Therapy Minutes: 60    Rehabilitation Precautions/Restrictions:  Weight Bearing Status: no restrictions  Precaution Instructions Given to Patient: Yes  Restricted BP Precautions: other (comment) (TED hose, abdominal binder)  Communication Precautions: Speak slowly  Other Precautions: falls, decreased LE strength/sensation, orthostatic hypotension    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 year old right hand-dominant female   hx afib on Eliquis, HTN, HLD, DM, COPD, TIAs, COPD, bipolar disorder, [facility] admit 2/7-2/17/23 for rhabdomyolysis (peak CK 4464 - statin d/c'ed) + vomiting ?gastroparesis + starvation ketoacidosis + UTI, [facility] admit 2/18-2/20/23 for rhabdomyolysis (peak CK 2509) + transaminitis + bilateral leg weakness + right paresthesias, [facility] ER visit 3/2 for bilateral leg weakness who presents to [facility] with difficulty standing and walking, bilateral leg/hand weakness, bilateral leg/hand/face paresthesias. She noted bilateral leg weakness with her prior [facility] admission 2/7-2/17 and was readmitted for bilateral leg weakness 2/18-2/20. The past 4 days she has had difficulty standing and walking. The past 4 days she notes new bilateral hand and face paresthesias and bilateral hand weakness. She has some shortness of breath but is speaking in full sentences. She went to [facility] ER 3/2 and was discharged. She saw her PMD who advised she go to an [facility]. At [facility] she got MRI brain and CTL-spine which did not show a cause for her symptoms. [facility] called neurology Dr. Francesco Sor who said to consider IVIG for 5 days, LP, EMG. The LP was done at [facility]. Dr. Francesco Sor advised  transfer to [facility]. These symptoms are sudden onset, moderate intensity, without alleviating factors.  Following admission, patient was seen by neurology consult who performed MRI of the entire spine and brain, without contrast which came back to show no major issues except on the lumbar area L4-L5 disc disease with facet arthrosis and degenerative changes, neurology team recommended LP and CSF studies so far are unrevealing, patient continued to complain of having bandlike pain around the epigastric area & in the back, there was tenderness on the right side of the thoracic vertebral area in the muscle, Lidoderm patch applied, patient is getting Protonix and Maalox, has not had a bowel movement for that reason given lactulose and stool softeners.  Patient was sent from enema multiple hospital for consideration of IVIG with a suspicion of GBS, EMG currently pending.   Due to her history of not eating well and has been losing weight over the past few weeks, neurology team felt may be nutritional and started on on IV thiamine, patient has been requesting narcotics for the pain that she is got but not really having any tenderness on examination.  Patient did NIF and vital capacity on the morning of 12/26/2021 which came back -12 cm H2O and vital capacity 1.5 L.  Patient is not in any respiratory distress, not needing any oxygen at this time.  Patient had additional laboratory studies including HIV and syphilis which came back negative, negative for COVID 19, potassium was noted to be low for which replacement was given, CPK slightly elevated at 239.  Cultures remain negative.  Patient is A&O X 4 and following commands, she is tolerating a regular diet, last BM was 3/11. No hearing problems, wears glasses for reading. Lives with daughter is usually able to walk around the house, uses a RW for community level ambulation, patient usually requires help from her daughter for LE bathing but otherwise independent. Patient with IJ  Line which was placed for PLEX, however no longer going to have PLEX so it will be discontinued. Patient is working with PT and OT and would benefit from inpatient AR to get her as close to her pre-morbid status as possible and to manage his co-morbid conditions as above.      Patient was admitted to acute rehab on 01/03/2022        Subjective   Patient Report: I am ready  Patient/Caregiver Goals: I want to be independent  Pain Assessment:  Pain Assessment: No/denies pain    Objective         Interventions:     External aid:     Of note, pt did not follow up on HW assigned during last SLP session to record details of her day using [employer] notes. Pt reports however that she has recorded her medications and continues to record follow up tasks needed for [employer]. Anticipate motivation might be a barrier given cognitive burden of task. Attempted to ease burden by instructing pt on note dictation feature.    Elements of systematic instruction applied to ensure learning of speech-to-text feature on '[employer] notes' app for daily tracking, including task analysis, massed practice X 3, guided feedback, and highlighting details to stimulate attention to detail effective in pt teaching back successfully in second teachback opportunity.     Pt's queries re: discharge plan adequately answered referring to CM notes on waiting for insurance authorization. Pt asked to talk to CM re: details given evolving discharge plan. CM informed of pt related queries re: discharge via secure chat    Encounter Problems       Encounter Problems (Active)       Memory       LTG:  Patient will demonstrate increased advocacy in compensatory cognitive strategies during family training sessions to improve recall and attention during structured tasks as reported in post task analysis in at least 1-2 sessions. (Adequate for Discharge)       Start:  01/09/22            STG:  Patient will implement cognitive strategies (reducing background noise, external  aids, repetition) in order to participate in structured tasks evidenced via verbalization in at least 2/5 opportunities over the course of 2 sessions.  (Adequate for Discharge)       Start:  01/09/22    Expected End:  01/12/22         Goal Note       4/3: Introduced paper-pencil systems, but pt not demonstrating consistent uptake or adherence. Modified system to pre-existing, self-generated strategy to use '[employer] notes' app on phone and assigned HW to attempt tracking however pt did not attempt. Pt independent with using [employer] notes to track her desired priorities including medications and follow up tasks from social routines (ex: book club)                    Encounter Problems (Resolved)       Speech/Language       LTG:  Patient will demonstrate improved speech intelligibility evidenced by communication partners reporting increase from 75% intelligibility and/or  a 3-5 point increase on CPIB or QOL-dys.  (Completed)       Start:  01/09/22    Resolved:  01/24/22      Goal Note       4/3: Pt's score on CPIB increased from 11 to 20 demonstrating marked improvement in intelligibility. Incidental cues needed less than 1x/2 sessions for incorporating speech strategies, with pt independently able to recite speech strategy              STG:  Patient will demonstrate use of compensatory speech strategies (Be clear) during structured tasks at the sentence level to increase intelligibility to 80% accuracy provided cues no more than 50% of the time.  (Completed)       Start:  01/09/22    Expected End:  01/12/22    Resolved:  01/17/22      Goal Note       3/17: Inter-rater reliability testing with colleague rating recorded description of cookie theft picture demonstrating improved intelligibility this session compared to session conducted 2 days prior. Pt demonstrating ability to apply slow speech strategies with functional phrase production list.                     Education Documentation  Communication strategies, taught  by Barbara Cower, SLP at 01/24/2022  2:21 PM.  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  Learner: Patient  Readiness: Acceptance  Method: Explanation, Teach Back, Handout, Demonstration  Response: Verbalizes Understanding, Demonstrated Understanding, Able to Teach Back, Needs Reinforcement    Education Comments  No comments found.        Assessment & Plan   Summary of Progress and Current Status: Ms. Loretto received skilled SLP services to address speech and cognition impairments in the setting of suspected GBS with IVIG treatment to assess with management. She came in after multiple hospitalizations with similar presenting symptoms, a hx of low PO intake and gastroparesis which also contributed to nutritional deficiencies. Spontaneous recovery was accelerated by skilled interventions, particularly training on speech strategy of slowing rate with use of delayed auditory feedback/review for building insight and shaping conceptualization. Cognitively, emphasis was placed on external aid training primarily to address issues with memory. Despite collaborative goal setting to identify viable systems, a barrier to attaining her cognitive goals was uptake and consistent adaptation. Anticipate that pre-existing personality factors in the setting of premorbid mental health diagnoses may have impacted internal motivation. Final discharge session focus on training use speech-to-text feature on pre-familiar app on phone to leverage reduced load in the setting of previously established  systems to improve recall with daily event tracking. Anticipate that uptake might initially be heavily externally mediated, and family support may increase likelihood of adaptation. Skilled SLP suggested but not warranted, given subjective reports of spontaneous progress and recoveries of cognitive function, increased internal motivation, and increased consistency and adaptation for memory strategies introduced during her inpatient rehabilitation stay. SNF discharge appropriate given barriers to making steady progress with mobility and ADL goals, largely psychological that is causing increased caregiver burden at this time.    Plan:  Risks/Benefits/POC  Discussed with Pt/Family: With patient  Treatment/Interventions: Speech/Language treatment, Cognitive linguistic retraining    Recommendation:  Discharge Recommendation: SNF, Other (Comment) (SLP skilled needs dependent on pt's motivation)  SLP Therapy Recommendation: 5-6 days/week, 60-120 mins/day, 1:1 treatment  SLP Estimated Length of Stay: March 4    Swallow Recommendations:   Diet Solids Recommendation: regular  Diet Liquids Recommendations: thin consistency  Precautions/Compensations: Awake/alert, Upright 90 degrees for all oral intake  Recommended Form of Meds: PO, whole, with liquid

## 2022-01-24 NOTE — Progress Notes (Signed)
Patient c/o chest pain 7/10 on the left side, VS checked, hospitalist called, order for EKG received, and was done. patient stated morphine works better for his pain. Patient was evaluated by hospitalist, labs ordered, PRN dilaudid given will continue with plan of care.

## 2022-01-24 NOTE — Plan of Care (Signed)
Complaints of chest pain this morning.      - EKG  - Cbc, cmp, troponin    Pain is reproducible upon palpation on their left breast.   Denies sob, n/v, abdominal pain and radiation of pain.  Denies previous pain like this before.  Onset to symptoms 30 minutes ago. Patient does not have IV access and allergic to NTG.   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  Pending EKG and labs to rule out ACS.    Vitals:    01/24/22 0636   BP: 105/67   Pulse: (!) 101   Resp:    Temp:    SpO2: 100%       - Dorris Fetch, PA-C

## 2022-01-24 NOTE — Plan of Care (Signed)
Problem: Bladder Management  Goal: LTG: Pt will be 100% continent and independent with bladder elim at [place]  Outcome: Progressing  Note: Pt has indwelling foley catheter for retention. Failed attempt at foley removal as pt required I and O cath. Output in qs amounts.     Problem: Cardiac Function  Goal: LTG: Pt will be independent with BP and pulse management and medication at [place]  Outcome: Progressing  Note: BP stable. Remains on remote telemetry and runs tachy 100-115.     Problem: Pain Management  Goal: LTG: Pt will achieve good pain control with current medication regimen as evidenced by ability to participate in 100% of therapy sessions.  Outcome: Progressing  Note: On lidoderm patch scheduled and prn Benadryl, Dilaudid and gabapentin for reports of pain.     Problem: Safety Risk  Goal: LTG: Pt will recognize limitations and use call bell 100% of the time prior to transfers in order to prevent fsalls  Outcome: Adequate for Discharge  Note: Pt has advanced to the green level for falls risk. She uses her call bell appropriately.     Problem: Skin Wound Management  Goal: LTG: Excoriation at buttocks will resolve by [place]  Outcome: Adequate for Discharge  Note: Excoriation at buttocks has resolved. Pt calls for staff assistance with repositioning as needed. Remains on specialty bed.     Problem: Compromised Tissue integrity  Goal: Damaged tissue is healing and protected  Outcome: Adequate for Discharge  Flowsheets (Taken 01/10/2022 1940)  Damaged tissue is healing and protected:   Monitor/assess Braden scale every shift   Reposition patient every 2 hours and as needed unless able to reposition self   Increase activity as tolerated/progressive mobility   Relieve pressure to bony prominences for patients at moderate and high risk   Avoid shearing injuries   Keep intact skin clean and dry   Use bath wipes, not soap and water, for daily bathing   Use incontinence wipes for cleaning urine, stool and caustic drainage. Foley  care as needed   Monitor external devices/tubes for correct placement to prevent pressure, friction and shearing   Encourage use of lotion/moisturizer on skin   Monitor patient's hygiene practices   Utilize specialty bed  Note: Excoriation at buttocks has resolved. Pt calls for staff assistance with repositioning as needed. Remains on specialty bed.  Goal: Nutritional status is improving  Outcome: Adequate for Discharge  Flowsheets (Taken 01/24/2022 2013)  Nutritional status is improving: Allow adequate time for meals  Note: PO intake of fluids and meals is qs.

## 2022-01-24 NOTE — Progress Notes (Signed)
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 The patient was found in her room. She was alert and cooperative. [name] was oriented to person, place, time, date and situation. Affect was WNL, and she made appropriate eye contact. She was engaging throughout the session and demonstrated spontaneous speech. Personal grooming and hygiene were good. Speech was of normal rhythm, volume, rate, and prosody.  Processing speed was within normal limits. Behavioral signs of pain were not observed. There was no evidence of her responding to internal stimuli, and there was no delusional content in her speech. At the time of the session, the patient did not appear to be in or report any acute distress. Thought process was linear and goal oriented; content was appropriate. Patient did not endorse any suicidal ideation, intent, and/or plan.    Assessment  Impressions: During this session, the patient discussed her progress with rehabilitation and experiences in the hospital setting. She was given space to process biopsychosocial stressors and factors that affect rehabilitation. Insights and patterns were explored. Cognitions were lightly challenged  and reframed. At 11am, this patient also practiced CBT techniques to challenge fears that affect walking and progress toward rehabilitation goals. The patient was engaging and implemented techniques well.    Plan  Increase adaptive coping and adjustment to medical conditions and changes in functional mobility.

## 2022-01-25 NOTE — PT Progress Note (Addendum)
Physical Therapy  Inpatient Rehabilitation Daily Progress Note    Patient Name:  [name]       Medical Record Number: [id number]   Date of Birth: [birth year]  Sex: Female          Room/Bed:  M501/M501-02    Therapy Received:  Start Time: 1100   Stop Time: 1200  Total Therapy Minutes: 60    Rehabilitation Precautions/Restrictions:  Weight Bearing Status: no restrictions  Precaution Instructions Given to Patient: Yes  Restricted BP Precautions: other (comment) (TED hose, abdominal binder)  Communication Precautions: Speak slowly  Other Precautions: falls, decreased LE strength/sensation, orthostatic hypotension    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 Abdominal binder and Ted hose donned throughout session and VS remain WFL, although she does not engage in any standing or gait activity this session. Pt practices w/c mobility over uneven, outdoor surfaces with supervision and occasional VC for steering and negotiating obstacles.    Large portion of session spent on therapeutic listening and discussing upcoming discharge to SNF. Pt remains highly anxious and can be self-limiting during sessions, refusing any standing or gait training this hour due to fear of falling and episodes of orthostatic hypotension.       Education Materials engineer, taught by Doylene Canning, PT at 01/25/2022  1:36 PM.  Learner: Patient  Readiness: Acceptance  Method: Explanation  Response: Verbalizes Understanding    Safety issues and interventions, taught by Doylene Canning, PT at 01/25/2022  1:36 PM.  Learner: Patient  Readiness: Acceptance  Method: Explanation  Response: Verbalizes  Understanding    Rehab techniques/procedure, taught by Doylene Canning, PT at 01/25/2022  1:36 PM.  Learner: Patient  Readiness: Acceptance  Method: Explanation  Response: Verbalizes Understanding    Plan of care, taught by Doylene Canning, PT at 01/25/2022  1:36 PM.  Learner: Patient  Readiness: Acceptance  Method: Explanation  Response: Verbalizes Understanding    Functional transfers/mobility, taught by Doylene Canning, PT at 01/25/2022  1:36 PM.  Learner: Patient  Readiness: Acceptance  Method: Explanation  Response: Verbalizes Understanding    Fall prevention/balance training, taught by Doylene Canning, PT at 01/25/2022  1:36 PM.  Learner: Patient  Readiness: Acceptance  Method: Explanation  Response: Verbalizes Understanding    Education Comments  No comments found.         Assessment & Plan   Pt continues to display poor endurance, weakness, and decreased activity tolerance. Based on pt's functional status, she is not safe to discharge home due to heavy burden of care. Recommend [place] to SNF at this time to maximize safety and functional independence prior to returning home with family.     Plan:  Risks/Benefits/POC Discussed with Pt/Family: With patient  Treatment/Interventions: Gait training, Exercise, Stair training, Neuromuscular re-education, Functional transfer training, LE strengthening/ROM, Endurance training, Patient/family training, Equipment eval/education, Bed mobility, Compensatory technique education, Continued evaluation    Recommendation:  Discharge Recommendation: SNF  PT Therapy Recommendation: 5-6 days/week, 60-120 mins/day, 1:1 treatment  PT Estimated Length of Stay: 10-14 days from IE on 01/04/2022

## 2022-01-25 NOTE — Care and Service Plan (Signed)
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]   Admit Date/Time:  01/03/2022 10:00 PM  Admission Comments: No comment available     Primary Diagnosis: Myositis    Patient Active Problem List    Diagnosis Date Noted    Myositis 01/03/2022    Hypokalemia 12/26/2021    Thoracic back pain 12/26/2021    Gastroparesis 12/26/2021    Weakness of both legs 12/25/2021    Guillain Barr syndrome 12/25/2021    History of asthma 06/16/2019    History of transient ischemic attack (TIA) 06/16/2019    Type 2 diabetes mellitus 06/16/2019    Syncope and collapse 06/16/2019    Left-sided weakness 05/20/2019    Chest pain with moderate risk for cardiac etiology 05/11/2019    Non-traumatic subcutaneous emphysema 01/07/2019    Chest pain with high risk of acute coronary syndrome 01/06/2019    Paroxysmal atrial fibrillation 01/06/2019    Hypertension 01/06/2019    Hyperlipidemia 01/06/2019    Seizure disorder 01/06/2019    History of gastrointestinal hemorrhage 01/06/2019    Depression 01/06/2019    Anemia 01/06/2019    Asthma 01/06/2019    Chest pain 08/22/2018    CVA (cerebral vascular accident) 08/21/2018       Vital Signs  Blood Pressure: 120/79  Temperature: 98.6 F (37 C)  Pulse: (!) 112  Respirations: 18  Pain Scale Used: Numeric Scale (0-10)  Pain Score: 0  Pain Location: Back  Pain Orientation: Lower  Pain Descriptors: Aching  Pain Frequency: Intermittent      Weight and Nutrition  Admission Weight: 60.8 kg (134 lb 1.6 oz)  Current Weight: 58.6 kg (129 lb 3 oz)  Diet Type: Regular, Thin Liquid    Plan of Care  Anticipated Discharge Date: Data Unavailable  Discharge Plan: Daughters home  Fall Risk Level: [place]  Is the duration of therapy 15 hours over 7 days instead of the standard 3 hours of therapy, 5 out of 7 days a week?: No  The  patient will benefit from continued services by:   Physical Therapy Recommendation: 5-6 days/week, 60-120 mins/day, 1:1 treatment  Occupational Therapy Recommendation: 5-6 days/week, 60-120 mins/day, 1:1 treatment, Group therapy  Speech Language Pathology Recommendation: 5-6 days/week, 60-120 mins/day, 1:1 treatment      The following is a list of patient problems that have been identified by the interdisciplinary team:   Encounter Problems       Encounter Problems (Active)       Bladder Management       LTG: Pt will be 100% continent and independent with bladder elim at [place] (Progressing)       Start:  01/03/22    Expected End:  01/29/22         Goal Note       Pt has indwelling foley catheter for retention. Failed attempt at foley removal as pt required I and O cath. Output in qs amounts.                 Cardiac Function       LTG: Pt will be independent with BP and pulse management and medication at [place] (Progressing)       Start:  01/03/22    Expected  End:  01/29/22         Goal Note       BP stable. Remains on remote telemetry and runs tachy 100-115.                 Compromised Tissue integrity       Damaged tissue is healing and protected (Adequate for Discharge)       Start:  01/04/22       Interventions:  1. Monitor/assess Braden scale every shift  2. Provide wound care per wound care algorithm  3. Reposition patient every 2 hours and as needed unless able to reposition self  4. Increase activity as tolerated/progressive mobility  5. Relieve pressure to bony prominences for patients at moderate and high risk  6. Avoid shearing injuries   7. Keep intact skin clean and dry  8. Use bath wipes, not soap and water, for daily bathing   9. Use incontinence wipes for cleaning urine, stool and caustic drainage; Foley care as needed   10. Monitor external devices/tubes for correct placement to prevent pressure, friction and shearing   11. Encourage use of lotion/moisturizer on skin  12. Monitor patient's hygiene  practices  13. Consult/collaborate with wound care nurse   14. Utilize specialty bed  15. Consider placing an indwelling catheter if incontinence interferes with healing of stage 3 or 4 pressure injury       Initial    Damaged tissue is healing and protected : Monitor/assess Braden scale every shift;Reposition patient every 2 hours and as needed unless able to reposition self;Increase activity as tolerated/progressive mobility;Relieve pressure to bony prominences for patients at moderate and high risk;Avoid shearing injuries;Keep intact skin clean and dry;Use bath wipes, not soap and water, for daily bathing;Use incontinence wipes for cleaning urine, stool and caustic drainage. Foley care as needed;Monitor external devices/tubes for correct placement to prevent pressure, friction and shearing;Encourage use of lotion/moisturizer on skin;Monitor patient's hygiene practices;Utilize specialty bed      Goal Note       Excoriation at buttocks has resolved. Pt calls for staff assistance with repositioning as needed. Remains on specialty bed.              Nutritional status is improving (Adequate for Discharge)       Start:  01/04/22       Interventions:  1. Assist patient with eating   2. Allow adequate time for meals   3. Encourage patient to take dietary supplement(s) as ordered   4. Collaborate with Clinical Nutritionist  5. Include patient/patient care companion in decisions related to nutrition       Initial Last    Nutritional status is improving: Assist patient with eating;Allow adequate time for meals Allow adequate time for meals      Goal Note       PO intake of fluids and meals is qs.                 Hemodynamic Status: Cardiac       Stable vital signs and fluid balance (Progressing)       Start:  01/04/22    Expected End:  01/06/22       Interventions:  1. Monitor/assess vital signs and telemetry per unit protocol  2. Weigh on admission and record weight daily  3. Assess signs and symptoms associated with  cardiac rhythm changes  4. Monitor intake/output per unit protocol and/or LIP order  5. Monitor lab values  6. Monitor for  leg swelling/edema and report to LIP if abnormal       Initial    Stable vital signs and fluid balance: Monitor/assess vital signs and telemetry per unit protocol;Assess signs and symptoms associated with cardiac rhythm changes;Monitor intake/output per unit protocol and/or LIP order;Monitor for leg swelling/edema and report to LIP if abnormal;Monitor lab values;Weigh on admission and record weight daily            Inadequate Tissue Perfusion       Adequate tissue perfusion will be maintained (Progressing)       Start:  01/04/22    Expected End:  01/06/22       Interventions:  1. Monitor/assess vital signs  2. Monitor/assess lab values and report abnormal values  3. Monitor/assess neurovascular status (pulses, capillary refill, pain, paresthesia, paralysis, presence of edema)  4. Monitor intake and output  5. Monitor/assess for signs of VTE (edema of calf/thigh redness, pain)  6. Monitor for signs and symptoms of a pulmonary embolism (dyspnea, tachypnea, tachycardia, confusion)  7. VTE Prevention: Administer anticoagulant(s) and/or apply anti-embolism stockings/devices as ordered  8. Encourage/assist patient as needed to turn, cough, and perform deep breathing every 2 hours  9. Reinforce use of ordered respiratory interventions (i.e. CPAP, BiPAP, Incentive Spirometer, Acapella, etc.)  10. Perform active/passive ROM  11. Reinforce ankle pump exercises  12. Increase mobility as tolerated/progressive mobility  13. Elevate feet  14. Position patient for maximum circulation/cardiac output  15. Place shoes or other foot protection on patient  16. Assess and monitor skin integrity  17. Provide wound/skin care       Initial    Adequate tissue perfusion will be maintained: Monitor/assess vital signs;Monitor/assess lab values and report abnormal values;Monitor/assess neurovascular status (pulses,  capillary refill, pain, paresthesia, paralysis, presence of edema);Monitor intake and output;Monitor for signs and symptoms of a pulmonary embolism (dyspnea, tachypnea, tachycardia, confusion);Encourage/assist patient as needed to turn, cough, and perform deep breathing every 2 hours;Reinforce use of ordered respiratory interventions (i.e. CPAP, BiPAP, Incentive Spirometer, Acapella, etc.)            Mobility       LTG: Patient will ambulate >150 feet at a Mod I level and navigate 1 FOS with CGA, both with LRAD in order to access the home and community environments.  (Progressing)             Nutrition       Nutritional intake is adequate (Progressing)       Start:  01/04/22    Expected End:  01/06/22       Interventions:  1. Monitor daily weights  2. Assist patient with meals/food selection  3. Allow adequate time for meals  4. Encourage/perform oral hygiene as appropriate   5. Encourage/administer dietary supplements as ordered (i.e. tube feed, TPN, oral, OGT/NGT, supplements)  6. Consult/collaborate with Clinical Nutritionist  7. Include patient/patient care companion in decisions related to nutrition  8. Assess anorexia, appetite, and amount of meal/food tolerated  9. Consult/collaborate with Speech Therapy (swallow evaluations)       Initial Last    Nutritional intake is adequate: Monitor daily weights;Assist patient with meals/food selection;Encourage/perform oral hygiene as appropriate;Encourage/administer dietary supplements as ordered (i.e. tube feed, TPN, oral, OGT/NGT, supplements);Assess anorexia, appetite, and amount of meal/food tolerated;Consult/collaborate with Clinical Nutritionist;Include patient/patient care companion in decisions related to nutrition;Consult/collaborate with Speech Therapy (swallow evaluations);Allow adequate time for meals Assist patient with meals/food selection;Allow adequate time for meals;Encourage/perform oral hygiene as appropriate;Encourage/administer dietary supplements  as  ordered (i.e. tube feed, TPN, oral, OGT/NGT, supplements);Consult/collaborate with Clinical Nutritionist;Include patient/patient care companion in decisions related to nutrition;Assess anorexia, appetite, and amount of meal/food tolerated;Monitor daily weights            Pain Management       LTG: Pt will achieve good pain control with current medication regimen as evidenced by ability to participate in 100% of therapy sessions. (Progressing)       Start:  01/03/22    Expected End:  01/29/22         Goal Note       On lidoderm patch scheduled and prn Benadryl, Dilaudid and gabapentin for reports of pain.                 Safety Risk       LTG: Pt will recognize limitations and use call bell 100% of the time prior to transfers in order to prevent fsalls (Adequate for Discharge)       Start:  01/03/22    Expected End:  01/29/22         Goal Note       Pt has advanced to the green level for falls risk. She uses her call bell appropriately.                 Self Care Management       LTG: Pt will perform ADL routine at spv level with use of compensatory strategies and AE prn upon d/c.  (Progressing)             Skin Wound Management       LTG: Excoriation at buttocks will resolve by [place] (Adequate for Discharge)       Start:  01/03/22    Expected End:  01/29/22         Goal Note       Excoriation at buttocks has resolved. Pt calls for staff assistance with repositioning as needed. Remains on specialty bed.                    Encounter Problems (Resolved)       Communication       Patient will be able to communicate wants/needs regarding POC utilizing compenstatory speech strategies at the discourse level with caregiver support as indicated. (Completed)       Resolved:  01/24/22                Rehab Team Discussion         Self Care Functional Status:   Admission Assessment Current Status Discharge  Goal   Functional Area: Care Score:  Care Score:    Eating 5 4 (01/19/22 0800) 6   Oral Hygiene 5 5 (01/19/22 0800) 6    Toileting Hygiene 7 3 (01/19/22 0800) 6   Shower/Bathe Self 88 3 (01/19/22 0800) 4   Upper Body Dressing 4 5 (01/19/22 0800) 6   Lower Body Dressing 2 3 (01/25/22 0700) 4   Putting On/Taking Off Footwear 7 3 (01/19/22 0800) 4       Mobility Functional Status:   Admission Assessment Current   Status  Discharge   Goal   Functional Area: Care Score:  Care Score:    Roll Left and Right 4 4 (01/24/22 1100) 6   Sit to Lying 4 4 (01/24/22 1100) 6   Lying to Sitting on Side of Bed 3 4 (01/24/22 1100) 6   Sit to Stand 3 4 (01/24/22 1100) 6  Chair/Bed-to-Chair Transfer 3 4 (01/24/22 1100) 6   Toilet Transfer 7 3 (01/19/22 0800) 6   Car Transfer 88 88 (01/24/22 1100) 6   Walk 10 Feet 88 3 (01/24/22 1100) 6   Walk 50 Feet with Two Turns 88 88 (01/24/22 1100) 6   Walk 150 Feet 88 88 (01/24/22 1100) 6   Walking 10 Feet on Uneven Surfaces 88 88 (01/24/22 1100) 6   1 Step (Curb) 88 88 (01/24/22 1100) 4   4 Steps 88 88 (01/24/22 1100) 4   12 Steps 88 88 (01/24/22 1100) 4   Wheel 50 Feet with Two Turns 9 4 (01/24/22 1100) 9   Wheel 150 Feet 9 4 (01/24/22 1100) 9   Picking Up Object 88 88 (01/19/22 0800) Independent (with use of AE)

## 2022-01-25 NOTE — OT Progress Note (Addendum)
Occupational Therapy  Inpatient Rehabilitation Weekly Progress Note    Patient Name:  [name]       Medical Record Number: [id number]   Date of Birth: 22-Sep-1956  Sex: Female          Room/Bed:  M501/M501-02    Therapy Received:  Start Time: 0700   Stop Time: 0800  Total Therapy Minutes: 60    Rehabilitation Precautions/Restrictions:  Weight Bearing Status: no restrictions  Precaution Instructions Given to Patient: Yes  Restricted BP Precautions: other (comment) (TED hose, abdominal binder)  Communication Precautions: Speak slowly  Other Precautions: falls, decreased LE strength/sensation, orthostatic hypotension    Rehab Diagnosis: 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]  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 Pt is agreeable to participation in therapy session. Handoff with RN appreciated prior to session, reporting no concerns with participation in session.     OT offered a spongebath ADL, however pt. Politely declined reporting she already completed a spongebath earlier this morning.     Pt completed LB dressing in bed, please refer below for details:    Pt. Independently donned abdominal binder while in bed. Pt. Performed lateral scoot pivot transfer from EOB>w.c. with CGA    Pt. Was dependently transported to [facility] gym while sitting on w.c. Pt engaged in red theraband HEP targeting all major muscle groups to promote increased UB strength for increased pt safety and independence during ADLs/IADLs. Provided visual demo and mod verbal and tactile cues for proper form. 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       Self Care Functional Status:   Current Status Current Status Discharge Goal   Functional Area: Care Score: Comments:    Lower Body Dressing 3 In bed: min A to thread foley bag through pants. Pt able to therad pants through BLE.  Pt required min A to pull pants and brief over hips by rolling L and R   Supervision or touching assistance       Education Documentation  Home exercise program, taught by Marlowe Shores, OT at 01/25/2022  8:00 AM.  Learner: Patient  Readiness: Acceptance  Method: Explanation, Demonstration  Response: [name] Understanding, Demonstrated Understanding    Functional transfers/mobility, taught by Marlowe Shores, OT at 01/25/2022  8:00 AM.  Learner: Patient  Readiness: Acceptance  Method: Explanation, Demonstration  Response: [name] Understanding, Demonstrated Understanding    ADL retraining, taught by Marlowe Shores, OT at 01/25/2022  8:00 AM.  Learner: Patient  Readiness: Acceptance  Method: Explanation, Demonstration  Response: Verbalizes Understanding, Demonstrated Understanding    Education Comments  No comments found.        Assessment & Plan   Pt. Performed LB dressing with mod A for managing foley bag and to pull pants over hips in bed. Pt benefitted from gospel podcast to help her feel more relaxed as pt. Reported feeling nervous about d.c. plan. Pt would  benefit from continued OT to increase independence and safety with ADLs and IADLs at d.c      Plan:  Risks/Benefits/POC Discussed with Pt/Family: With patient  Treatment Interventions: ADL retraining, Functional transfer training, UE strengthening/ROM, Endurance training, Cognitive reorientation, Patient/Family training, Equipment eval/education, Neuro muscular reeducation, Fine motor coordination activities, Compensatory technique education    Recommendation:  Discharge Recommendation: SNF  OT  Therapy Recommendation: 5-6 days/week, 60-120 mins/day, 1:1 treatment, Group therapy  OT Estimated Length of Stay: 14 days    Groups appropriate for patient:   UE Group, Mobility Group

## 2022-01-25 NOTE — Plan of Care (Signed)
Patient alert and oriented x4, routine medications given as ordered and patient tolerated well. Continues on pain management with prn dilaudid. Fall and safety precautions in place.   Problem: Bladder Management  Goal: LTG: Pt will be 100% continent and independent with bladder elim at [place]  Outcome: Progressing     Problem: Cardiac Function  Goal: LTG: Pt will be independent with BP and pulse management and medication at [place]  Outcome: Progressing     Problem: Pain Management  Goal: LTG: Pt will achieve good pain control with current medication regimen as evidenced by ability to participate in 100% of therapy sessions.  Outcome: Progressing     Problem: Mobility  Goal: LTG: Patient will ambulate >150 feet at a Mod I level and navigate 1 FOS with CGA, both with LRAD in order to access the home and community environments.   Outcome: Progressing

## 2022-01-25 NOTE — Progress Notes (Addendum)
Case Management  Inpatient Rehabilitation Progress Note    Spoke w/ daughter 2x; daughter doesn't want [facility] SNF. Now wants [facility]--they don't take [employer]. Daughter agreeable to [facility] SNF. Spoke w/ [name] in admissions; CM requested they start the [employer] auth. UAI done.     Rico Junker, MSW, LMSW, ACM-SW  Social Worker Case Manager II  Acute Rehab  [facility]  [address]  [place], [place] [address]  [phone]  Spectra [phone]

## 2022-01-25 NOTE — OT Progress Note (Signed)
Occupational Therapy  Inpatient Rehabilitation Daily Progress Note    Patient Name:  [name]       Medical Record Number: [id number]   Date of Birth: Sep 28, 1956  Sex: Female          Room/Bed:  M501/M501-02    Therapy Received:  Start Time: 1330   Stop Time: 1400  Total Therapy Minutes: 30    Rehabilitation Precautions/Restrictions:  Weight Bearing Status: no restrictions  Precaution Instructions Given to Patient: Yes  Restricted BP Precautions: other (comment) (TED hose, abdominal binder)  Communication Precautions: Speak slowly  Other Precautions: falls, decreased LE strength/sensation, orthostatic hypotension    Rehab Diagnosis: 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]  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 Pt agreeable to OT services. Pt seen for missed minutes for 30 minutes with pt participating fully in session. Pt completes bed mobility supine to sit EOB with SPV. Pt performs scoot pivot transfer with SPV to/from bed and wheelchair. Pt attempts standing balance task at table top however pt reports increased dizziness with standing. Task downgrading to sitting with increased resistance demands. Pt completes 2x30 resistance clothespin pinches to pick up and transfer various size objects at table top level:  Trial 1) RUE, 30 reps, blue (mod resistance)   Trial 2) LUE, 12 reps completes with blue resistance, downgraded to green resistance for remaining reps     Pt transitions to seated BUE strengthening task to maximize pt upper extremity endurance and bilateral performance during ADL/IADl tasks:  - 3x15 chest press 2#  - 3x15 shoulder flexion 2#       Education Documentation  Safety issues and interventions, taught by Maryfrances Bunnell, OT at 01/25/2022  3:13 PM.  Learner: Patient  Readiness:  Eager  Method: Explanation  Response: Verbalizes Understanding    Education Comments  No comments found.        Assessment & Plan   Pt demonstrates ongoing progress towards goals but is limited in session due to low BP and onset of dizziness in standing. Pt will benefit from graded standing tasks to improve pt tolerance to upright position as well as vital monitoring throughout sessions. Pt would benefit from ongoing skilled OT services to maximize pt independence with daily routine and maximize pt safety upon discharge.     Plan:  Risks/Benefits/POC Discussed with Pt/Family: With patient  Treatment Interventions: ADL retraining, Functional transfer training, UE strengthening/ROM, Endurance training, Cognitive reorientation, Patient/Family training, Equipment eval/education, Neuro muscular reeducation, Fine motor coordination activities, Compensatory technique education    Recommendation:  Discharge Recommendation: SNF  OT Therapy Recommendation: 5-6 days/week, 60-120 mins/day, 1:1 treatment, Group therapy  OT Estimated Length of Stay: 14 days

## 2022-01-25 NOTE — UM Notes (Signed)
Case Management  CM / UR: Continued Stay Review    Approved; NRD 01/30/22.     Rico Junker, MSW, LMSW, ACM-SW  Social Worker Case Manager II  Acute Rehab  [facility]  [address]  [place], [place] [address]  Direct: [phone]  Fax: [phone]  [email or web address]

## 2022-01-25 NOTE — Progress Notes (Signed)
MEDICINE PROGRESS NOTE  [facility], DIVISION OF HOSPITALIST MEDICINE   [facility]   [place] Pager: [phone]      Date Time: 01/25/22 3:37 PM  Patient Name: [name],[name]  Attending Physician: Anastasia PallPerez Gonzalez, Edson SnowballCarlos [facility]*  Hospital Day: 23  Assessment:   66 year old female with past medical history significant for diabetes, hypertension, nonischemic cardiomyopathy, paroxysmal A-fib, anemia, bipolar, TIAs [facility] with lower extremity weakness, paresthesias, transaminitis thought to have 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 Patient was discharged on 2/17 and went back to the hospital on 2/18 for continued management of rhabdomyolysis.  Patient noted to have left-sided weakness with associated difficulty with walking, standing, also developing bilateral hand and feet paresthesias and hand weakness,she was seen by neurology, work-up including MRIs, lumbar puncture done with no clear etiology,was [facility].  [facility], first dose of IVIG was started,but apparently,she developed allergic reaction therefore IVIG was discontinued. Diagnosis at the time of discharge stayed unclear,DDx was possible statin induced myositis vs. inflammatory myositis vs. immune mediated myositis.  She was also noted to have multiple vitamin deficiencies including D, zinc, B6, iron deficiency which are being replaced.  Patient was discharged to acute rehab on 3/14.    Recommendations:   Myositis: Unclear etiology including possible statin induced myositis versus inflammatory arthritis versus immune mediated myositis  -[facility] unsuccessful due to development of allergic reaction  -Patient's access was removed - as the initial plan to plasma exchange was cancelled. 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 role of biopsy?      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%  -Echo 12/31/2021 with EF 43%, mild global hypokinesis  -Noted per cardiology note 3/13 with no further plans for ischemic work-up as patient had cardiac catheterization in 2022 with no obstructive CAD  -outpatient follow-up for nonischemic cardiomyopathy work-up  -Repeat echo as outpatient      Hyponatremia,resolved likely hypovolemia based on low urine Na and urine osmolality   -Na 129---->134-->135-->141  -monitor BMP periodically       metabolic acidosis,resolved  -HCO3 was 12 and now 16-->18-->21-->24  -no s/o infection      Vitamin D, B6, B1, zinc deficiencies  -Continue multivitamin supplement  -Continue thiamine(received IV replacement 3/4-3/9), vitamin B6, zinc supplements       Normocytic anemia  -Hgb stable around 8.5  -Noted TSAT of 21%, ferritin 492, normal folate level on 3/10;  -normal vitamin B12 level on 3/4  -cont ferrous sulfate  -Hgb has been stable       Moderate protein calorie malnutrition  -Weight loss, was on Ozempic but discontinued  -c/w glucerna and regular diet      Paroxysmal A-fib  -currently in sinus  -cont metoprolol XL 125 mg in evening (increased from 100 mg on 3/17)  -Continue Eliquis  -Follow heart rate      Orthostatic hypotension  -Responding to midodrine and now on 10 mg in am and  afternoon for now (continuing metoprolol at night with tachycardia and PAF)  -c/w compression stocking and abd binder  -she is on Flomax and need to monitor her closely        Urinary retension  -Currently has a Foley catheter  again on 4/2, failed voiding trial on 3/13 and 3/27  -has been on Flomax       COPD,Not in exacerbation   -continue Symbicort  -prn duonebs  -Pt had c/o shortness of breath on 3/21-resolved  -no hypoxia noted/no fever  -Chest x-ray was done 3/21-no acute changes  -Patient subsequently felt better  -has been using her inhaler, no wheezing noted       Bipolar disorder  -Continue home meds including Seroquel, ropinirole; home latuda 120mg  daily(pt has home med)       DMII  -Hemoglobin A1C 5.3% on 3/6  -Patient was on Ozempic but stopped due to malnutrition/weight loss  -No need for sliding-scale   -Diet controlled       H/o GERD  -c/w PPI      Constipation  -resolved, c/w bowel regimen   -last BM 2 days ago per pt      DVT ppx:on Eliquis      Case was discussed with patient and RN    Subjective/ROS/24hr events:     CC: Myositis    Patient was seen and examined at bedside,with no complaints of dizziness/lightheadedness,  Patient denies any chest pain or shortness of breath today,  No cough,afebrile,+BM 2 days ago and s/p foley replacement after failing voiding trial +flatus,no abd pain or N/V      Physical Exam:     Temp:  [98.2 F (36.8 C)-98.6 F (37 C)] 98.6 F (37 C)  Heart Rate:  [103-115] 112  Resp Rate:  [12-18] 18  BP: (106-120)/(69-79) 114/74    Intake/Output Summary (Last 24 hours) at 01/25/2022 1537  Last data filed at 01/25/2022 1300  Gross per 24 hour   Intake 840 ml   Output 1000 ml   Net -160 ml    General: alert and awake, no distress  HEENT: anicteric, moist mucous membranes  Neck: supple, no JVD  CVS: nl S1, S2; no gallop or rub  Lungs: clear to auscultation b/l, no wheezing or rhonchi; normal resp effort  Abd: soft, non-tender, non-distended  Ext: no pedal edema, no cyanosis  Neuro: alert and awake, moving all extremities with overall weakness in LE especially   +foley since 4/2       Meds:   Medications were reviewed:  Scheduled Meds:  Current Facility-Administered Medications   Medication Dose Route  Frequency    apixaban  5 mg Oral Q12H SCH    bisacodyl  10 mg Rectal Once    budesonide-formoterol  2 puff Inhalation BID    ferrous sulfate  324 mg Oral QAM W/BREAKFAST    lidocaine  1 patch Transdermal Q24H    lurasidone  120 mg Oral QPM    metoprolol succinate  125 mg Oral QHS    midodrine  10 mg Oral BID Meals    pantoprazole  40 mg Oral QAM AC    polyethylene glycol  17 g Oral Daily    QUEtiapine  100 mg Oral QHS    rOPINIRole  1 mg Oral QHS    senna-docusate  2 tablet Oral QHS    tamsulosin  0.4 mg Oral Daily after dinner    thiamine  100 mg Oral Daily    vitamin B-6  50 mg Oral Daily    vitamin D  25 mcg Oral Daily    vitamins/minerals  1 tablet Oral Daily at 1700    zinc sulfate  220 mg Oral Daily at 1700     Continuous Infusions:  PRN Meds:.acetaminophen, albuterol-ipratropium, bisacodyl, bisacodyl, diphenhydrAMINE, gabapentin, HYDROmorphone, lactulose, LORazepam, melatonin, naloxone, promethazine  Labs/Radiology:   Imaging personally reviewed, including: all available   No results found.  No results for input(s): GLUCOSEWB in the last 24 hours.  Recent Labs   Lab 01/24/22  0628 01/20/22  0658   Sodium 141 137   Potassium 3.9 3.6   Chloride 110 106   BUN 5.0* 3.0*   Creatinine 0.5 0.5   EGFR >60.0 >60.0   Glucose 104* 96   Calcium 8.8 8.5     Recent Labs   Lab 01/24/22  0628 01/20/22  0658   WBC 6.88 7.13   Hgb 8.5* 8.1*   Hematocrit 26.9* 25.5*   Platelets 356* 339         Signed by: Richarda Osmond, MD,FACP

## 2022-01-26 ENCOUNTER — Inpatient Hospital Stay
Admission: EM | Admit: 2022-01-26 | Discharge: 2022-02-08 | DRG: 092 | Disposition: A | Discharge status: Skilled Nursing Facility | Payer: 59 | Attending: Internal Medicine | Admitting: Internal Medicine

## 2022-01-26 DIAGNOSIS — Z79899 Other long term (current) drug therapy: Secondary | ICD-10-CM

## 2022-01-26 DIAGNOSIS — E785 Hyperlipidemia, unspecified: Secondary | ICD-10-CM | POA: Diagnosis present

## 2022-01-26 DIAGNOSIS — I480 Paroxysmal atrial fibrillation: Secondary | ICD-10-CM | POA: Diagnosis present

## 2022-01-26 DIAGNOSIS — K3184 Gastroparesis: Secondary | ICD-10-CM | POA: Diagnosis present

## 2022-01-26 DIAGNOSIS — I10 Essential (primary) hypertension: Secondary | ICD-10-CM | POA: Diagnosis present

## 2022-01-26 DIAGNOSIS — F32A Depression, unspecified: Secondary | ICD-10-CM | POA: Diagnosis present

## 2022-01-26 DIAGNOSIS — Z8673 Personal history of transient ischemic attack (TIA), and cerebral infarction without residual deficits: Secondary | ICD-10-CM

## 2022-01-26 DIAGNOSIS — E8729 Other acidosis: Secondary | ICD-10-CM | POA: Diagnosis present

## 2022-01-26 DIAGNOSIS — Z7951 Long term (current) use of inhaled steroids: Secondary | ICD-10-CM

## 2022-01-26 DIAGNOSIS — G8220 Paraplegia, unspecified: Secondary | ICD-10-CM | POA: Diagnosis present

## 2022-01-26 DIAGNOSIS — J449 Chronic obstructive pulmonary disease, unspecified: Secondary | ICD-10-CM | POA: Diagnosis present

## 2022-01-26 DIAGNOSIS — E1143 Type 2 diabetes mellitus with diabetic autonomic (poly)neuropathy: Secondary | ICD-10-CM | POA: Diagnosis present

## 2022-01-26 DIAGNOSIS — R000 Tachycardia, unspecified: Secondary | ICD-10-CM

## 2022-01-26 DIAGNOSIS — G2582 Stiff-man syndrome: Principal | ICD-10-CM | POA: Diagnosis present

## 2022-01-26 DIAGNOSIS — D638 Anemia in other chronic diseases classified elsewhere: Secondary | ICD-10-CM | POA: Diagnosis present

## 2022-01-26 DIAGNOSIS — E876 Hypokalemia: Secondary | ICD-10-CM | POA: Diagnosis present

## 2022-01-26 DIAGNOSIS — Z7901 Long term (current) use of anticoagulants: Secondary | ICD-10-CM

## 2022-01-26 LAB — COMPREHENSIVE METABOLIC PANEL
ALT: 11 U/L (ref 0–55)
AST (SGOT): 16 U/L (ref 5–41)
Albumin/Globulin Ratio: 0.9 (ref 0.9–2.2)
Albumin: 2.7 g/dL — ABNORMAL LOW (ref 3.5–5.0)
Alkaline Phosphatase: 75 U/L (ref 37–117)
Anion Gap: 10 (ref 5.0–15.0)
BUN: 6 mg/dL — ABNORMAL LOW (ref 7.0–21.0)
Bilirubin, Total: 0.3 mg/dL (ref 0.2–1.2)
CO2: 22 mEq/L (ref 17–29)
Calcium: 9 mg/dL (ref 8.5–10.5)
Chloride: 105 mEq/L (ref 99–111)
Creatinine: 0.6 mg/dL (ref 0.4–1.0)
Globulin: 3.1 g/dL (ref 2.0–3.6)
Glucose: 111 mg/dL — ABNORMAL HIGH (ref 70–100)
Potassium: 3.8 mEq/L (ref 3.5–5.3)
Protein, Total: 5.8 g/dL — ABNORMAL LOW (ref 6.0–8.3)
Sodium: 137 mEq/L (ref 135–145)

## 2022-01-26 LAB — GFR: EGFR: >60

## 2022-01-26 LAB — CBC AND DIFFERENTIAL
Absolute NRBC: 0 10*3/uL (ref 0.00–0.00)
Basophils Absolute Automated: 0.05 10*3/uL (ref 0.00–0.08)
Basophils Automated: 0.5 %
Eosinophils Absolute Automated: 0.22 10*3/uL (ref 0.00–0.44)
Eosinophils Automated: 2.3 %
Hematocrit: 29.3 % — ABNORMAL LOW (ref 34.7–43.7)
Hgb: 9.4 g/dL — ABNORMAL LOW (ref 11.4–14.8)
Immature Granulocytes Absolute: 0.03 10*3/uL (ref 0.00–0.07)
Immature Granulocytes: 0.3 %
Instrument Absolute Neutrophil Count: 5.89 10*3/uL (ref 1.10–6.33)
Lymphocytes Absolute Automated: 2.8 10*3/uL (ref 0.42–3.22)
Lymphocytes Automated: 29.1 %
MCH: 25.8 pg (ref 25.1–33.5)
MCHC: 32.1 g/dL (ref 31.5–35.8)
MCV: 80.3 fL (ref 78.0–96.0)
MPV: 8.9 fL (ref 8.9–12.5)
Monocytes Absolute Automated: 0.64 10*3/uL (ref 0.21–0.85)
Monocytes: 6.6 %
Neutrophils Absolute: 5.89 10*3/uL (ref 1.10–6.33)
Neutrophils: 61.2 %
Nucleated RBC: 0 /100 WBC (ref 0.0–0.0)
Platelets: 415 10*3/uL — ABNORMAL HIGH (ref 142–346)
RBC: 3.65 10*6/uL — ABNORMAL LOW (ref 3.90–5.10)
RDW: 19 % — ABNORMAL HIGH (ref 11–15)
WBC: 9.63 10*3/uL — ABNORMAL HIGH (ref 3.10–9.50)

## 2022-01-26 LAB — CK: Creatine Kinase (CK): 21 U/L — ABNORMAL LOW (ref 29–233)

## 2022-01-26 LAB — PT AND APTT
PT INR: 1.1 (ref 0.9–1.1)
PT: 13.2 s — ABNORMAL HIGH (ref 10.1–12.9)
PTT: 32 s (ref 27–39)

## 2022-01-26 LAB — ECG 12-LEAD
Atrial Rate: 112 {beats}/min
QTC Calculation (Bezet): 453 ms
T Axis: 254 degrees
Ventricular Rate: 112 {beats}/min

## 2022-01-26 LAB — LACTATE DEHYDROGENASE: LDH: 212 U/L (ref 120–331)

## 2022-01-26 MED ORDER — POLYETHYLENE GLYCOL 3350 17 G PO PACK
17.0000 g | PACK | Freq: Every day | ORAL | Status: DC
Start: 2022-01-27 — End: 2024-01-22

## 2022-01-26 MED ORDER — HYDROMORPHONE HCL 2 MG PO TABS
2.0000 mg | ORAL_TABLET | Freq: Four times a day (QID) | ORAL | 0 refills | Status: DC | PRN
Start: 2022-01-26 — End: 2022-02-07

## 2022-01-26 MED ORDER — LORAZEPAM 0.5 MG PO TABS
0.5000 mg | ORAL_TABLET | Freq: Every evening | ORAL | 0 refills | Status: DC | PRN
Start: 2022-01-26 — End: 2022-02-07

## 2022-01-26 MED ORDER — LURASIDONE HCL 120 MG PO TABS
120.0000 mg | ORAL_TABLET | Freq: Every evening | ORAL | Status: AC
Start: 2022-01-26 — End: ?

## 2022-01-26 MED ORDER — MIDODRINE HCL 10 MG PO TABS
10.0000 mg | ORAL_TABLET | Freq: Two times a day (BID) | ORAL | Status: DC
Start: 2022-01-27 — End: 2023-11-23

## 2022-01-26 MED ORDER — TAMSULOSIN HCL 0.4 MG PO CAPS
0.4000 mg | ORAL_CAPSULE | Freq: Every day | ORAL | Status: DC
Start: 2022-01-26 — End: 2024-01-22

## 2022-01-26 MED ORDER — SENNOSIDES-DOCUSATE SODIUM 8.6-50 MG PO TABS
2.0000 | ORAL_TABLET | Freq: Every evening | ORAL | Status: AC
Start: 2022-01-26 — End: ?

## 2022-01-26 MED ORDER — METOPROLOL SUCCINATE ER 25 MG PO TB24
125.0000 mg | ORAL_TABLET | Freq: Every evening | ORAL | Status: DC
Start: 2022-01-26 — End: 2022-02-07

## 2022-01-26 MED ORDER — QUETIAPINE FUMARATE 100 MG PO TABS
100.0000 mg | ORAL_TABLET | Freq: Every evening | ORAL | Status: DC
Start: 2022-01-26 — End: 2022-02-07

## 2022-01-26 MED ORDER — ZINC SULFATE 220 (50 ZN) MG PO CAPS
220.0000 mg | ORAL_CAPSULE | Freq: Every day | ORAL | Status: DC
Start: 2022-01-26 — End: 2023-06-21

## 2022-01-26 NOTE — Progress Notes (Addendum)
Case Management  Inpatient Rehabilitation Progress Note    9:29AM  Pt's insurance denied a SNF dispo. Our attending is calling the Manufacturing engineer for the peer-to-peer.     Insurance P2P: [id number]  NAME: [name], [name]  DOB: 07-Dec-1955  MEMBER ID: [id number]    Dispo: [facility] pending [employer] insurance auth.    12:10PM  Appeal successful per attending.     CM notified [name] w/ [facility] and requested a bed assignment and CR.     12:16PM  D/C: 01/26/22  Dispo: [facility].   Rm: 200  Report: [phone] ext [phone] or [phone]  Admissions: [name]  Transport: [facility]  Pick up: 4:00PM    Spoke w/ daughter, she's agreeable to this d/c plan.     12:29PM  Met w/ pt at bedside; she's agreeable to the d/c plan, too.     Rico Junker, MSW, LMSW, ACM-SW  Social Worker Case Manager II  Acute Rehab  [facility]  [address]  [place], [place] [address]  [phone]  Spectra [phone]

## 2022-01-26 NOTE — Discharge Summary (Signed)
REHABILITATION MEDICINE  DISCHARGE SUMMARY AND DISCHARGE NOTE    Patient Identification  [name] is a 66 y.o. female.  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       Discharge Medication List        Taking      acetaminophen 325 MG tablet  Dose: 650 mg  Commonly known as: TYLENOL  Take 2 tablets (650 mg total) by mouth every 4 (four) hours as needed for Pain     albuterol sulfate HFA 108 (90 Base) MCG/ACT inhaler  Dose: 2 puff  Commonly known as: PROVENTIL  For: Asthma, Chronic Obstructive Lung Disease  Inhale 2 puffs into the lungs every 6 (six) hours as needed for Wheezing     apixaban 5 MG  Dose: 5 mg  Commonly known as: ELIQUIS  Take 1 tablet (5 mg total) by mouth every 12 (twelve) hours     budesonide-formoterol 80-4.5 MCG/ACT inhaler  Dose: 2 puff  Commonly known as: SYMBICORT  For: Asthma, Chronic Obstructive Lung Disease  Inhale 2 puffs into the lungs daily     calcium carbonate 500 MG chewable tablet  Dose: 1,000 mg  Commonly known as: TUMS  Chew 2 tablets (1,000 mg) by mouth every 6 (six) hours as needed for Heartburn     cholecalciferol 25 MCG (1000 UT) tablet  Dose: 25 mcg  Commonly known as: vitamin D3  Take 1 tablet (25 mcg) by mouth daily     diphenhydrAMINE 25 mg capsule  Dose: 25 mg  Commonly known as: BENADRYL  Take 1 capsule (25 mg) by mouth every 6 (six) hours as needed for Itching     ferrous sulfate 324 (65 FE) MG Tbec  Dose: 324 mg  Take 1 tablet (324 mg) by mouth every  other day     HYDROmorphone 2 MG tablet  Dose: 2 mg  What changed: reasons to take this  Commonly known as: DILAUDID  Take 1 tablet (2 mg) by mouth every 6 (six) hours as needed (severe pain)     LORazepam 0.5 MG tablet  Dose: 0.5 mg  Commonly known as: ATIVAN  Take 1 tablet (0.5 mg) by mouth nightly as needed for Anxiety     lurasidone 120 MG Tabs  Dose: 120 mg  What changed: when to take this  Commonly known as: LATUDA  Take 1 tablet (120 mg) by mouth every evening     metoprolol succinate XL 25 MG 24 hr tablet  Dose: 125 mg  What changed:   medication strength  how much to take  when to take this  Commonly known as: TOPROL-XL  Take 5 tablets (125 mg) by mouth nightly     midodrine 10  MG tablet  Dose: 10 mg  Commonly known as: PROAMATINE  Start taking on: January 27, 2022  Take 1 tablet (10 mg) by mouth 2 (two) times daily with meals At 8am and 12pm     nortriptyline 10 MG capsule  Dose: 10 mg  Commonly known as: PAMELOR  Take 1 capsule (10 mg) by mouth nightly     ondansetron 4 MG disintegrating tablet  Dose: 4 mg  Commonly known as: ZOFRAN-ODT  Take 1 tablet (4 mg) by mouth every 6 (six) hours as needed for Nausea     pantoprazole 40 MG tablet  Dose: 40 mg  Commonly known as: PROTONIX  Take 1 tablet (40 mg total) by mouth daily     polyethylene glycol 17 g packet  Dose: 17 g  Commonly known as: MIRALAX  Start taking on: January 27, 2022  Take 17 g by mouth daily     pyridoxine 50 MG tablet  Dose: 50 mg  Commonly known as: B-6  Take 1 tablet (50 mg) by mouth daily     QUEtiapine 100 MG tablet  Dose: 100 mg  What changed:   medication strength  how much to take  Commonly known as: SEROquel  Take 1 tablet (100 mg) by mouth nightly     rOPINIRole 1 MG tablet  Dose: 1 mg  Commonly known as: REQUIP  Take 1 tablet (1 mg total) by mouth nightly     senna-docusate 8.6-50 MG per tablet  Dose: 2 tablet  What changed:   when to take this  reasons to take this  Commonly known as: PERICOLACE  Take 2 tablets by mouth nightly      tamsulosin 0.4 MG Caps  Dose: 0.4 mg  Commonly known as: FLOMAX  Take 1 capsule (0.4 mg) by mouth Daily after dinner     thiamine 100 MG tablet  Dose: 100 mg  Commonly known as: B-1  Take 1 tablet (100 mg) by mouth daily     vitamins/minerals Tabs  Dose: 1 tablet  Take 1 tablet by mouth daily     zinc sulfate 220 (50 Zn) MG capsule  Dose: 220 mg  Commonly known as: ZINCATE  Take 1 capsule (220 mg) by mouth daily            STOP taking these medications      butalbital-acetaminophen-caffeine 50-325-40 MG per tablet  Commonly known as: FIORICET     lidocaine 5 %  Commonly known as: LIDODERM     losartan 25 MG tablet  Commonly known as: COZAAR     melatonin 3 mg tablet     nystatin 100000 UNIT/ML suspension  Commonly known as: MYCOSTATIN     terazosin 2 MG capsule  Commonly known as: HYTRIN     Trintellix 10 MG Tabs tablet  Generic drug: vortioxetine            Medication Review  A complete drug regimen review was completed: Yes  Were drug issues were found during review: No    History:      66 y.o. year old right hand-dominant female   hx afib on Eliquis, HTN, HLD, DM, COPD, TIAs, COPD, bipolar disorder, [facility] admit 2/7-2/17/23 for rhabdomyolysis (peak CK 4464 - statin d/c'ed) + vomiting ?gastroparesis + starvation ketoacidosis + UTI, [facility] admit 2/18-2/20/23 for rhabdomyolysis (peak CK 2509) + transaminitis + bilateral leg weakness + right paresthesias, [facility] ER visit 3/2 for bilateral leg weakness who presents to [facility] with difficulty standing  and walking, bilateral leg/hand weakness, bilateral leg/hand/face paresthesias. She noted bilateral leg weakness with her prior [facility] admission 2/7-2/17 and was readmitted for bilateral leg weakness 2/18-2/20. The past 4 days she has had difficulty standing and walking. The past 4 days she notes new bilateral hand and face paresthesias and bilateral hand weakness. She has some shortness of breath but is speaking in full sentences. She went to [facility] ER 3/2 and was  discharged. She saw her PMD who advised she go to an [facility]. At [facility] she got MRI brain and CTL-spine which did not show a cause for her symptoms. [facility] called neurology Dr. Francesco Sor who said to consider IVIG for 5 days, LP, EMG. The LP was done at [facility]. Dr. Francesco Sor advised transfer to [facility]. These symptoms are sudden onset, moderate intensity, without alleviating factors.  Following admission, patient was seen by neurology consult who performed MRI of the entire spine and brain, without contrast which came back to show no major issues except on the lumbar area L4-L5 disc disease with facet arthrosis and degenerative changes, neurology team recommended LP and CSF studies so far are unrevealing, patient continued to complain of having bandlike pain around the epigastric area & in the back, there was tenderness on the right side of the thoracic vertebral area in the muscle, Lidoderm patch applied, patient is getting Protonix and Maalox, has not had a bowel movement for that reason given lactulose and stool softeners.  Patient was [facility] from [facility] for consideration of IVIG with a suspicion of GBS, EMG currently pending.   Due to her history of not eating well and has been losing weight over the past few weeks, neurology team felt may be nutritional and started on on IV thiamine, patient has been requesting narcotics for the pain that she is got but not really having any tenderness on examination.  Patient did NIF and vital capacity on the morning of 12/26/2021 which came back -12 cm H2O and vital capacity 1.5 L.  Patient is not in any respiratory distress, not needing any oxygen at this time.  Patient had additional laboratory studies including HIV and syphilis which came back negative, negative for COVID 19, potassium was noted to be low for which replacement was given, CPK slightly elevated at 239.  Cultures remain negative.   Patient is A&O X 4 and following commands, she is tolerating a regular  diet, last BM was 3/11. No hearing problems, wears glasses for reading. Lives with daughter is usually able to walk around the house, uses a RW for community level ambulation, patient usually requires help from her daughter for LE bathing but otherwise independent. Patient with IJ Line which was placed for PLEX, however no longer going to have PLEX so it will be discontinued. Patient is working with PT and OT and would benefit from inpatient AR to get her as close to her pre-morbid status as possible and to manage his co-morbid conditions as above.      Patient was admitted to acute rehab on 01/03/2022     Hospital Course:         Patient was admitted to acute rehab on 01/03/2022   Patient continued to work with therapy for weakness due to myositis, she will follow up with neurology who want to purpose a muscle biopsy in the future. She continued on metoprolol and eliquis for afib. 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 She continued on symbicort for COPD. She  continued on seroquel and latuda for bipolar disorder. She continued on dilaudid for pain.     INPATIENT REHABILITATION FACILITY - PATIENT ASSESSMENT INSTRUMENT QUALITY INDICATORS     INPATIENT REHABILITATION FACILITY - PATIENT ASSESSMENT INSTRUMENT QUALITY INDICATORS: COGNITIVE PATTERNS    Cognitive Pattern Assessment Used: BIMS   Repetition of Three Words (First Attempt): 3   Temporal Orientation: Year: Correct   Temporal Orientation: Month: Accurate within 5 days  Temporal Orientation: Day: Incorrect  Recall: "Sock": Yes, no cue required  Recall: "Blue": Yes, after cueing ("a color")  Recall: "Bed": No, could not recall  BIMS Summary Score: 11      INPATIENT REHABILITATION FACILITY - PATIENT ASSESSMENT INSTRUMENT QUALITY INDICATORS: CONFUSION ASSESSMENT METHOD (CAM)    Is there evidence of an acute change in mental status from the patient's baseline?: No   Inattention: Behavior not present   Disorganized  thinking: Behavior not present  Altered level of consciousness: Behavior not present      INPATIENT REHABILITATION FACILITY - PATIENT ASSESSMENT INSTRUMENT QUALITY INDICATORS: PHQ-2 TO 9    Will the patient answer the depression risk questions?: Yes   Little interest or pleasure in doing things: Not at all   Feeling down, depressed, or hopeless: Not at all   PHQ2 Score: 0       Discharge Functional Status:         Self Care Functional Status:    Current Status Current Status Discharge Goal   Functional Area: Care Score: Comments:     Eating 6      Independent   Oral Hygiene 6 Seated in wheelchair Independent   Toileting Hygiene 4 Bed level SPV; minA at [facility] Independent   Shower/Bathe Self 5 Bed level soap and water; unsafe for full shower d/t hypotension Supervision or touching assistance   Upper Body Dressing 6      Independent   Lower Body Dressing 4 VC for foley management, SPV at bed level Supervision or touching assistance   Putting On/Taking Off Footwear 3 Assistance for TED hose, IND for all other footwear needs Supervision or touching assistance      Mobility Functional Status:    Current   Status Current Status Discharge Goal   Functional Area: Care Score: Comments:     Toilet Transfer 4 SPV for squat pivot, CGA for stand pivot using RW Independent   Picking Up Object 88 Patient is unsafe to attempt at this time due to high fall risk Independent (with use of AE)     Discharge Exam:  Vitals:    01/26/22 0838 01/26/22 0900 01/26/22 1015 01/26/22 1020   BP: 106/65 101/67 104/70 104/70   Pulse: (!) 114 (!) 107 (!) 114    Resp:       Temp:       TempSrc:       SpO2:       Weight:       Height:           No results for input(s): GLUCOSEWHOLE in the last 24 hours.  Recent Labs   Lab 01/24/22  0628 01/20/22  0658   WBC 6.88 7.13   Hgb 8.5* 8.1*   Hematocrit 26.9* 25.5*   Platelets 356* 339      Recent Labs   Lab 01/24/22  0628 01/20/22  0658   Sodium 141 137   Potassium 3.9 3.6   Chloride 110 106   CO2 24 25   BUN  5.0* 3.0*   Creatinine  0.5 0.5   Calcium 8.8 8.5   Glucose 104* 96         Procedures/Radiology performed:   Radiology: all results from this admission  XR Chest AP Portable    Result Date: 01/11/2022   No active disease Kinnie Feil, MD 01/11/2022 12:12 PM    CT Angiogram Chest    Result Date: 01/01/2022  1.Limited evaluation of the subsegmental pulmonary arteries. No evidence of pulmonary embolism given this limitation. 2.Dilated ascending aorta measuring up to 4.1 cm. Demetrios Isaacs, MD 01/01/2022 7:57 AM    MRI Femur Left W WO Contrast    Result Date: 12/30/2021  1. Mild edema and atrophy involving the semimembranosus and biceps femoris muscles of the right and left thighs, noted to be bilateral and symmetric in appearance. Mild enhancement on the postcontrast images. Very mild edema in the anterior compartment muscles of the right and left thighs, with mild enhancement. Mild atrophy of the semimembranosus and biceps femoris muscles bilaterally. These findings are nonspecific, and may be seen in the setting of muscle denervation or myositis. 2. No atrophy involving the anterior compartment muscles. 3. Mild edema in the proximal adductor longus and adductor magnus muscles bilaterally. 4. No areas of nonenhancement to suggest muscle necrosis. [name], MD 12/30/2021 3:22 PM    MRI Femur Right W WO Contrast    Result Date: 12/30/2021  1. Mild edema and atrophy involving the semimembranosus and biceps femoris muscles of the right and left thighs, noted to be bilateral and symmetric in appearance. Mild enhancement on the postcontrast images. Very mild edema in the anterior compartment muscles of the right and left thighs, with mild enhancement. Mild atrophy of the semimembranosus and biceps femoris muscles bilaterally. These findings are nonspecific, and may be seen in the setting of muscle denervation or myositis. 2. No atrophy involving the anterior compartment muscles. 3. Mild edema in the proximal adductor longus and  adductor magnus muscles bilaterally. 4. No areas of nonenhancement to suggest muscle necrosis. [name], MD 12/30/2021 3:22 PM    CT Chest WO Contrast    Result Date: 12/28/2021  No evidence of malignancy in the chest. Carlynn Spry, MD 12/28/2021 2:41 PM    Triple Lumen Dialysis Cath    Result Date: 12/28/2021  Right IJ 13 French 20 cm trialysis catheter placement. Tip in the deep right atrium (only place where adequate flow was obtained) . PLAN: Catheter ready for use. PROCEDURE SUMMARY: *Venous access in ultrasound guidance. *Nontunneled dialysis catheter placement. TECHNIQUE/FINDINGS: Informed consent for the procedure including risks, benefits and alternatives was obtained. A safety timeout was performed and documented with all members of the procedure team present, including verification of patient identification, correct procedure, procedure site, and laterality. The patient was positioned supine. Preparation: (MIPS): The site was prepared and draped using all elements of maximal sterile barrier technique including sterile gloves, sterile gown, cap, mask, large sterile sheet, sterile ultrasound probe cover, hand hygiene and cutaneous antisepsis with 2% chlorhexidine. Medical reason for site preparation exception (MIPS): Not applicable. Sedation: The patient was placed under no additional IV sedation which was administered/monitored by not applicable. Sonographic interrogation of the right internal jugular vein demonstrated patency. Local anesthesia was administered at the access site. A small dermatotomy an #11 scalpel was made and venous access was achieved with a 21 gauge single wall needle. Sonographic images were obtained pre- and post- puncture for documentation.  A 0.018 inch guidewire was advanced into the inferior vena cava. The needle was  exchanged for a 5 French coaxial dilator system. Using fluoroscopic guidance, the 0.018 inch guidewire was removed and marked at the appropriate length for the  intravascular portion of the catheter and a 0.035 inch wire was then advanced into the inferior vena cava. Sequential dilation was performed over the wire and the tiialysis catheter was advanced. The wire was removed. The catheter were evaluated for appropriate flow and were flushed with normal saline. The catheter was secured to the skin with nonabsorbable suture. A sterile dressing was applied. The procedure was well tolerated.  A fluoroscopic image was obtained to document catheter tip position at the right atrium. All indicated images were saved to PACS. CONTRAST: Contrast agent: None Contrast volume (mL): 0 RADIATION PARAMETERS: Fluoroscopy time (minutes): 0.6  Reference air kerma (mGy): 3.6 Darci Current, MD 12/28/2021 12:42 PM    CT Abdomen Pelvis WO IV/ WO PO Cont    Result Date: 12/28/2021  Distended urinary bladder. Otherwise unremarkable noncontrast abdomen pelvis CT. Rocky Crafts, MD 12/28/2021 11:26 AM   Surgery: all results from this admission  * No surgery found *    Discharge planning, preparation, and coordination took more than 35 minutes.    Signed by: Cheryll Dessert, MD   [facility].

## 2022-01-26 NOTE — ED Provider Notes (Signed)
[facility] EMERGENCY DEPARTMENT H&P      Visit date: 01/26/2022      CLINICAL SUMMARY          Diagnosis:    .     Final diagnoses:   Stiff person syndrome         MDM Notes:      Shaylen Nephew is a 66 y.o. old female presenting for abnormal labs concerning for stiff person syndrome. Vitals stable. Nursing notes reviewed. Exam as listed. Differential includes autoimmune myositis, stiff person syndrome, rhabdomyolysis.     CK level not suggestive of recurrence of rhabdomyolysis.  Patient with no other new neurologic symptoms from time of discharge.  Discussed with neuro hospitalist and neurology team who both agree with plan for admission and initiation of plasmapheresis in the morning.    Patient remained stable during the rest of her ED course.    Medical Decision Making  Amount and/or Complexity of Data Reviewed  Labs: ordered.    Risk  Decision regarding hospitalization.               Disposition:         Inpatient Admit      ED Disposition       ED Disposition   Admit    Condition   --    Date/Time   Wed Jan 26, 2022 11:13 PM    Comment   Admitting Physician: Emeline Darling [[id number]]   Service:: Neurology [115]   Stroke or Rule Out Stroke?: No   Estimated Length of Stay: > or = to 2 midnights   Tentative Discharge Plan?: Home or Self Care [1]   Does patient need telemetry?: Yes   Is patient 18 yrs or greater?: Yes   Telemetry type (separate Telemetry order is also required):: Adult telemetry                              CLINICAL INFORMATION        HPI:      Chief Complaint: Abnormal Lab and Fatigue  .    Joellyn Grandt is a 66 y.o. female with complex chronic medical history including A-fib on Eliquis, hypertension, hyperlipidemia, diabetes, COPD, TIAs, rhabdomyolysis    who presents on recommendation of neurology team with finding of a positive GAD 65 testing concerning for neurologic autoimmune condition and need for plasmapheresis.  Plan for plasmapheresis in the morning.  She was  discharged from rehab facility earlier today and after arriving at SNF started experiencing generalized fatigue and weakness.  Received phone call from neurology team noting the abnormality in lab suggestive of autoimmune condition that would require plasmapheresis.  Based on recurrence of stiffness and weakness, also has concern for recurrence of rhabdomyolysis.  Patient otherwise denying any other new symptoms. Currently denying chest pain, shortness of breath, abdominal pain, nausea, vomiting, diarrhea, headache, new focal weakness, new rash, fevers.      History obtained from: patient, review of prior chart          ROS:      Positive and negative ROS elements as per HPI.  All other systems reviewed and negative.      Physical Exam:      Pulse (!) 120  BP 95/49  Resp 18  SpO2 99 %  Temp 97.2 F (36.2 C)    General appearance - alert, chronically ill appearing, and in no distress  Mental status - alert,  oriented to person, place, and time  Eyes - pupils equal and reactive, extraocular eye movements intact  Ears - bilateral TM's and external ear canals normal  Nose - normal and patent, no erythema, discharge or polyps  Mouth - mucous membranes moist, pharynx normal without lesions  Neck - supple, no significant adenopathy  Lymphatics - no palpable lymphadenopathy, no hepatosplenomegaly  Chest - clear to auscultation, no wheezes, rales or rhonchi, symmetric air entry  Heart -sinus tachycardia, regular rhythm, normal S1, S2, no murmurs, rubs, clicks or gallops  Abdomen - soft, nontender, nondistended, no masses or organomegaly  Back exam - full range of motion, no tenderness, palpable spasm or pain on motion  Neurological - alert, oriented, normal speech, no focal findings or movement disorder noted  Extremities - peripheral pulses normal, no pedal edema, no clubbing or cyanosis  Skin - normal coloration and turgor, no rashes, no suspicious skin lesions noted            PAST HISTORY        Primary Care  Provider: Malcolm Metro, MD        PMH/PSH:    .     Past Medical History:   Diagnosis Date    Anemia     Asthma     Atrial fibrillation     Chronic obstructive pulmonary disease     Convulsions     Depression     Diabetes mellitus     diet controlled     Hyperlipidemia     Hypertension     TIA (transient ischemic attack)        She has a past surgical history that includes Hysterectomy; pci; Cardiac catheterization (2017); Triple Lumen Dialysis Cath (N/A, 12/28/2021); and Non-Tunneled Cath Placement [facility]) (N/A, 01/27/2022).      Social/Family History:      She reports that she has never smoked. She has never used smokeless tobacco. She reports that she does not drink alcohol and does not use drugs.    Family History   Problem Relation Age of Onset    Stroke Maternal Grandmother          Listed Medications on Arrival:    .     Home Medications       Med List Status: Complete Set By: Serina Cowper, RN at 01/27/2022 12:04 AM              acetaminophen (TYLENOL) 325 MG tablet     Take 2 tablets (650 mg total) by mouth every 4 (four) hours as needed for Pain     albuterol sulfate HFA (PROVENTIL) 108 (90 Base) MCG/ACT inhaler     Inhale 2 puffs into the lungs every 6 (six) hours as needed for Wheezing     apixaban (ELIQUIS) 5 MG     Take 1 tablet (5 mg total) by mouth every 12 (twelve) hours     budesonide-formoterol (SYMBICORT) 80-4.5 MCG/ACT inhaler     Inhale 2 puffs into the lungs daily     calcium carbonate (TUMS) 500 MG chewable tablet     Chew 2 tablets (1,000 mg) by mouth every 6 (six) hours as needed for Heartburn     diphenhydrAMINE (BENADRYL) 25 mg capsule     Take 1 capsule (25 mg) by mouth every 6 (six) hours as needed for Itching     ferrous sulfate 324 (65 FE) MG Tablet Delayed Response     Take 1 tablet (324 mg) by mouth every  other day     HYDROmorphone (DILAUDID) 2 MG tablet     Take 1 tablet (2 mg) by mouth every 6 (six) hours as needed (severe pain)     LORazepam (ATIVAN) 0.5 MG tablet     Take 1  tablet (0.5 mg) by mouth nightly as needed for Anxiety     lurasidone (LATUDA) 120 MG Tab     Take 1 tablet (120 mg) by mouth every evening     metoprolol succinate (TOPROL-XL) 25 MG 24 hr tablet     Take 5 tablets (125 mg) by mouth nightly     midodrine (PROAMATINE) 10 MG tablet     Take 1 tablet (10 mg) by mouth 2 (two) times daily with meals At 8am and 12pm     nortriptyline (PAMELOR) 10 MG capsule     Take 1 capsule (10 mg) by mouth nightly     ondansetron (ZOFRAN-ODT) 4 MG disintegrating tablet     Take 1 tablet (4 mg) by mouth every 6 (six) hours as needed for Nausea     pantoprazole (PROTONIX) 40 MG tablet     Take 1 tablet (40 mg total) by mouth daily     polyethylene glycol (MIRALAX) 17 g packet     Take 17 g by mouth daily     QUEtiapine (SEROquel) 100 MG tablet     Take 1 tablet (100 mg) by mouth nightly     rOPINIRole (REQUIP) 1 MG tablet     Take 1 tablet (1 mg total) by mouth nightly     senna-docusate (PERICOLACE) 8.6-50 MG per tablet     Take 2 tablets by mouth nightly     tamsulosin (FLOMAX) 0.4 MG Cap     Take 1 capsule (0.4 mg) by mouth Daily after dinner     thiamine (B-1) 100 MG tablet     Take 1 tablet (100 mg) by mouth daily     vitamin B-6 (B-6) 50 MG tablet     Take 1 tablet (50 mg) by mouth daily     vitamin D (vitamin D3) 25 MCG (1000 UT) tablet     Take 1 tablet (25 mcg) by mouth daily     vitamins/minerals Tab     Take 1 tablet by mouth daily     zinc sulfate (ZINCATE) 220 (50 Zn) MG capsule     Take 1 capsule (220 mg) by mouth daily                                                           Allergies: She is allergic to singulair [montelukast], immune globulin (human), myoview [technetium-61m], atorvastatin, contrast [iodinated contrast media], latex, magnesium chloride, nitroglycerin, penicillins, and tetracyclines & related.            VISIT INFORMATION        Clinical Course in the ED:      Throughout the stay in the Emergency Department, questions and concerns surrounding pain  management, care plan, diagnostic studies, effects of medications administered or prescribed, and future care plan were assessed and addressed.           Medications Given in the ED:    .     ED Medication Orders (From admission, onward)      Start Ordered  Status Ordering Provider    01/27/22 2200 01/27/22 0013  lurasidone (LATUDA) tablet 120 mg  At bedtime        Route: Oral  Ordered Dose: 120 mg     Last MAR action: Given NGUYEN, HUY D    01/27/22 2200 01/27/22 0013  rOPINIRole (REQUIP) tablet 0.5 mg  At bedtime        Route: Oral  Ordered Dose: 0.5 mg     Last MAR action: Given NGUYEN, HUY D    01/27/22 2200 01/27/22 0013  senna-docusate (PERICOLACE) 8.6-50 MG per tablet 2 tablet  At bedtime        Route: Oral  Ordered Dose: 2 tablet     Last MAR action: Given NGUYEN, HUY D    01/27/22 1730 01/27/22 0013  tamsulosin (FLOMAX) capsule 0.4 mg  Daily        Route: Oral  Ordered Dose: 0.4 mg     Last MAR action: Given NGUYEN, HUY D    01/27/22 0900 01/27/22 0013    Every 12 hours        Route: Oral  Ordered Dose: 25 mg     Discontinued NGUYEN, HUY D    01/27/22 0900 01/27/22 0013  thiamine (VITAMIN B1) tablet 100 mg  Daily        Route: Oral  Ordered Dose: 100 mg     Last MAR action: Given NGUYEN, HUY D    01/27/22 0900 01/27/22 0013  vitamin B-6 (PYRIDOXINE) tablet 50 mg  Daily        Route: Oral  Ordered Dose: 50 mg     Last MAR action: Given NGUYEN, HUY D    01/27/22 0900 01/27/22 0013  vitamin D (cholecalciferol) tablet 25 mcg  Daily        Route: Oral  Ordered Dose: 25 mcg     Last MAR action: Given NGUYEN, HUY D    01/27/22 0900 01/27/22 0013  vitamins/minerals tablet 1 tablet  Daily        Route: Oral  Ordered Dose: 1 tablet     Last MAR action: Given NGUYEN, HUY D    01/27/22 0900 01/27/22 0013  zinc sulfate (ZINCATE) capsule 220 mg  Daily        Route: Oral  Ordered Dose: 220 mg     Last MAR action: Given NGUYEN, HUY D    01/27/22 0900 01/27/22 0013  polyethylene glycol (MIRALAX) packet 17 g  Daily         Route: Oral  Ordered Dose: 17 g     Last MAR action: Not Given NGUYEN, HUY D    01/27/22 0800 01/27/22 0013    RT - 2 times daily        Route: Inhalation  Ordered Dose: 2 puff     Discontinued NGUYEN, HUY D    01/27/22 0800 01/27/22 0013  midodrine (PROAMATINE) tablet 10 mg  2 times daily with meals        Route: Oral  Ordered Dose: 10 mg     Last MAR action: Given NGUYEN, HUY D    01/27/22 0745 01/27/22 0013  pantoprazole (PROTONIX) EC tablet 40 mg  Every morning        Route: Oral  Ordered Dose: 40 mg     Last MAR action: Given NGUYEN, HUY D    01/27/22 0630 01/27/22 0159  diazePAM (VALIUM) tablet 5 mg  Every 8 hours scheduled        Route: Oral  Ordered Dose: 5 mg     Last MAR action: Not Given NGUYEN, HUY D    01/27/22 0014 01/27/22 0013  0.9% NaCl infusion  Continuous        Route: Intravenous     Last MAR action: Stopped NGUYEN, HUY D    01/27/22 0013 01/27/22 0013    At bedtime PRN        Route: Oral  Ordered Dose: 0.5 mg     Discontinued NGUYEN, HUY D    01/27/22 0013 01/27/22 0013  HYDROmorphone (DILAUDID) tablet 2 mg  Every 6 hours PRN        Route: Oral  Ordered Dose: 2 mg     Last MAR action: Given NGUYEN, HUY D    01/27/22 0013 01/27/22 0013  diphenhydrAMINE (BENADRYL) capsule 25 mg  Every 12 hours PRN        Route: Oral  Ordered Dose: 25 mg     Acknowledged NGUYEN, HUY D    01/27/22 0013 01/27/22 0013  calcium carbonate (TUMS) chewable tablet 1,000 mg  Every 6 hours PRN        Route: Oral  Ordered Dose: 1,000 mg     Acknowledged NGUYEN, HUY D    01/27/22 0013 01/27/22 0013  labetalol (NORMODYNE,TRANDATE) injection 10 mg  Every 2 hours PRN        Route: Intravenous  Ordered Dose: 10 mg     Acknowledged NGUYEN, HUY D    01/27/22 0013 01/27/22 0013  hydrALAZINE (APRESOLINE) injection 10 mg  Every 2 hours PRN        Route: Intravenous  Ordered Dose: 10 mg     Acknowledged NGUYEN, HUY D    01/27/22 0013 01/27/22 0013  ondansetron (ZOFRAN-ODT) disintegrating tablet 4 mg  Every 6 hours PRN        Route:  Oral  Ordered Dose: 4 mg    See Hyperspace for full Linked Orders Report.    Last MAR action: Given NGUYEN, HUY D    01/27/22 0013 01/27/22 0013  ondansetron (ZOFRAN) injection 4 mg  Every 6 hours PRN        Route: Intravenous  Ordered Dose: 4 mg    See Hyperspace for full Linked Orders Report.    Last MAR action: See Alternative NGUYEN, HUY D    01/27/22 0013 01/27/22 0013  acetaminophen (TYLENOL) tablet 650 mg  Every 6 hours PRN        Route: Oral  Ordered Dose: 650 mg    See Hyperspace for full Linked Orders Report.    Last MAR action: Given NGUYEN, HUY D    01/27/22 0013 01/27/22 0013  acetaminophen (TYLENOL) suppository 650 mg  Every 6 hours PRN        Route: Rectal  Ordered Dose: 650 mg    See Hyperspace for full Linked Orders Report.    Last MAR action: See Alternative NGUYEN, HUY D    01/27/22 0013 01/27/22 0013    2 times daily PRN        Route: Oral  Ordered Dose: 2 tablet     Discontinued NGUYEN, HUY D    01/27/22 0013 01/27/22 0013  melatonin tablet 3 mg  At bedtime PRN        Route: Oral  Ordered Dose: 3 mg     Acknowledged NGUYEN, HUY D    01/27/22 0013 01/27/22 0013  magnesium sulfate 1g in dextrose 5% IVPB (premix)  As needed  Route: Intravenous  Ordered Dose: 1 g     Acknowledged NGUYEN, HUY D    01/27/22 0013 01/27/22 0013  potassium chloride (KLOR-CON M20) CR tablet 0-60 mEq  As needed        Route: Oral  Ordered Dose: 0-60 mEq    See Hyperspace for full Linked Orders Report.    Last MAR action: Given NGUYEN, HUY D    01/27/22 0013 01/27/22 0013  potassium chloride (KLOR-CON) packet 0-60 mEq  As needed        Route: Oral  Ordered Dose: 0-60 mEq    See Hyperspace for full Linked Orders Report.    Last MAR action: See Alternative NGUYEN, HUY D    01/27/22 0013 01/27/22 0013  naloxone (NARCAN) injection 0.2 mg  As needed        Route: Intravenous  Ordered Dose: 0.2 mg     Acknowledged NGUYEN, HUY D    01/27/22 0013 01/27/22 0013  albuterol sulfate HFA (PROVENTIL) inhaler 2 puff  RT -  Every 6 hours as needed        Route: Inhalation  Ordered Dose: 2 puff     Acknowledged NGUYEN, HUY D              Procedures:      Procedures      Interpretations:      O2 Sat:  The patient's oxygen saturation was 99 % on room air. This was independently interpreted by me as Normal.   EKG: I reviewed and Independently interpreted the patient's EKG as sinus tachycardia at 113. T wave inversion in lead V3. Otherwise no obvious ST changes in this poor quality EKG. No chest pain or SOB at the time of this EKG.  Cardiac Monitoring: I independently reviewed and interpreted the patient's cardiac monitoring as sinus tachycardia at 113.       NIH Stroke Score      Flowsheet Row Most Recent Value   Patient's calculated Stroke Score: 3 filed at 01/30/2022 2000              *This note was generated by the Epic / Dragon speech recognition system and may contain errors or omissions not intended by the user. Grammatical errors, random word insertions, deletions, pronoun errors and incomplete sentences are occasional consequences of this technology due to software limitations. Not all errors are caught or corrected. If there are questions or concerns about the content of this note or information contained within the body of this dictation they should be addressed directly with the author for clarification.*     *This note may contain test results that returned after care was transitioned to another provider or after discharge from the ED. These results are automatically entered in the chart by the electronic medical record, and the expectation is that a subsequent provider will act on any notable results.*               RESULTS        Lab Results:      Results       Procedure Component Value Units Date/Time    Basic Metabolic Panel [[id number]]  (Abnormal) Collected: 01/31/22 0501    Specimen: Blood Updated: 01/31/22 0629     Glucose 108 mg/dL      BUN 5.0 mg/dL      Creatinine 0.5 mg/dL      Calcium 8.6 mg/dL      Sodium 629 mEq/L       Potassium  3.6 mEq/L      Chloride 106 mEq/L      CO2 26 mEq/L      Anion Gap 8.0    GFR [[id number]] Collected: 01/31/22 0501     Updated: 01/31/22 0629     EGFR >60.0    CBC and differential [[id number]]  (Abnormal) Collected: 01/31/22 0501    Specimen: Blood Updated: 01/31/22 0555     WBC 8.92 x10 3/uL      Hgb 8.2 g/dL      Hematocrit 30.8 %      Platelets 322 x10 3/uL      RBC 3.11 x10 6/uL      MCV 81.4 fL      MCH 26.4 pg      MCHC 32.4 g/dL      RDW 18 %      MPV 9.4 fL      Instrument Absolute Neutrophil Count 5.73 x10 3/uL      Neutrophils 64.3 %      Lymphocytes Automated 26.7 %      Monocytes 6.2 %      Eosinophils Automated 2.1 %      Basophils Automated 0.3 %      Immature Granulocytes 0.4 %      Nucleated RBC 0.0 /100 WBC      Neutrophils Absolute 5.73 x10 3/uL      Lymphocytes Absolute Automated 2.38 x10 3/uL      Monocytes Absolute Automated 0.55 x10 3/uL      Eosinophils Absolute Automated 0.19 x10 3/uL      Basophils Absolute Automated 0.03 x10 3/uL      Immature Granulocytes Absolute 0.04 x10 3/uL      Absolute NRBC 0.00 x10 3/uL     Calcium, ionized [[id number]] Collected: 01/31/22 0129    Specimen: Blood Updated: 01/31/22 0158     Calcium, Ionized 2.50 mEq/L     Narrative:      Pre-procedure if AM lab not done  [place]uled by [id number] at 01/31/2022 01:19 Reason: Specimen drawn in   proximity to IV site.    Fibrinogen [[id number]] Collected: 01/31/22 0117    Specimen: Blood Updated: 01/31/22 0156     Fibrinogen 236 mg/dL     Narrative:      Pre-procedure, If AM lab not done  Pre-procedure if AM lab not done    GFR [[id number]] Collected: 01/31/22 0117     Updated: 01/31/22 0153     EGFR >60.0    Narrative:      Pre-procedure, If AM lab not done  Pre-procedure if AM lab not done    Basic Metabolic Panel [[id number]]  (Abnormal) Collected: 01/31/22 0117    Specimen: Blood Updated: 01/31/22 0153     Glucose 108 mg/dL      BUN 6.0 mg/dL      Creatinine 0.5 mg/dL      Calcium 8.6 mg/dL      Sodium 387  mEq/L      Potassium 3.3 mEq/L      Chloride 108 mEq/L      CO2 24 mEq/L      Anion Gap 8.0    Narrative:      Pre-procedure, If AM lab not done  Pre-procedure if AM lab not done    CBC without differential [[id number]]  (Abnormal) Collected: 01/31/22 0117    Specimen: Blood Updated: 01/31/22 0146     WBC 8.79 x10 3/uL      Hgb 7.8 g/dL  Hematocrit 24.4 %      Platelets 282 x10 3/uL      RBC 3.02 x10 6/uL      MCV 80.8 fL      MCH 25.8 pg      MCHC 32.0 g/dL      RDW 18 %      MPV 8.8 fL      Nucleated RBC 0.0 /100 WBC      Absolute NRBC 0.00 x10 3/uL     Narrative:      Pre-procedure, If AM lab not done  Pre-procedure if AM lab not done                Radiology Results:      Non-Tunneled Cath Placement [facility])   Final Result      Right internal jugular approach non-tunneled ([place]) central venous   catheter placement.      PLAN:   Catheter is ready for use.         Verline Lema MD   01/28/2022 11:27 AM          @POCUSRESULTS @            Scribe Attestation:      I was acting as a Neurosurgeon for [employer], Mohammed Kindle, MD on [place],Koi       I am the first provider for this patient and I personally performed the services documented.  is scribing for me on Matus,Lorynn. This note and the patient instructions accurately reflect work and decisions made by me.  Velvet Moomaw, Mohammed Kindle, MD                  [place], [place] Judie Petit, MD  02/01/22 [phone]

## 2022-01-26 NOTE — PT Progress Note (Signed)
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  Patient/Caregiver Goals: I want to be independent  Pain Assessment:  Pain Score: 8-severe pain  Pain Location: Back  Pain Intervention(s): Medication (See eMAR), Repositioned    Objective   Vitals:  Heart Rate: (!) 114  BP: 104/70  Patient Position: Sitting    Interventions: Pt received resting in bed with TED hose donned, agreeable to make up missed minutes at this time. Donned abdominal binder prior to session. She completes bed mobility and lateral scoot transfer to manual w/c with supervision. RN notified regarding elevated pain levels (8/10 in low back) and pt was administered medication. Outdoors, she completed 2x10 seated marching, LAQ, and marching on each leg. With encouragement, she completed 1 sit<-->stand with min A and hand-held assistance. She stood x20 seconds then required seated break due to weakness in BLEs. Pt returned to room and transferred back to bed as above. Pain levels reduced to 3/10.      Education Documentation  No documentation found.  Education Comments  No comments found.         Assessment & Plan   No reports of dizziness during seated and brief standing activity today. Pt agreed to trial standing x1 attempt though requested not to trial ambulation. She continues  to require skilled inpatient PT to maximize safety and independence with functional mobility prior to discharge.     Plan:  Risks/Benefits/POC Discussed with Pt/Family: With patient  Treatment/Interventions: Gait training, Exercise, Stair training, Neuromuscular re-education, Functional transfer training, LE strengthening/ROM, Endurance training, Patient/family training, Equipment eval/education, Bed mobility, Compensatory technique education, Continued evaluation    Recommendation:  Discharge Recommendation: SNF  PT Therapy Recommendation: 5-6 days/week, 60-120 mins/day, 1:1 treatment  PT Estimated Length of Stay: 10-14 days from IE on 01/04/2022

## 2022-01-26 NOTE — Plan of Care (Signed)
Problem: Pain Management  Goal: LTG: Pt will achieve good pain control with current medication regimen as evidenced by ability to participate in 100% of therapy sessions.  Outcome: Progressing   Pain managed with scheduled pain medication and with relaxation techniques such as distraction and repositioning   Problem: Safety Risk  Goal: LTG: Pt will recognize limitations and use call bell 100% of the time prior to transfers in order to prevent fsalls  Outcome: Progressing   Following fall precautions currently in place. Bedside table and personal care items kept within reach at all times

## 2022-01-26 NOTE — ED Notes (Addendum)
This patient was seen by me in triage and initial testing was ordered based on presenting complaint. Care was expedited. I am not the primary provider for this patient.        66 y.o. female d/c from rehab now called back by Dr Joyce Gross neuro for +  GAB65 and needs plasmapheresis.  The only 1 CK tonight and they will do plasmapheresis in the morning       Elesa Hacker, MD  01/26/22 2144

## 2022-01-26 NOTE — PT Progress Note (Signed)
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 year old right hand-dominant female   hx afib on Eliquis, HTN, HLD, DM, COPD, TIAs, COPD, bipolar disorder, [facility] admit 2/7-2/17/23 for rhabdomyolysis (peak CK 4464 - statin d/c'ed) + vomiting ?gastroparesis + starvation ketoacidosis + UTI, [facility] admit 2/18-2/20/23 for rhabdomyolysis (peak CK 2509) + transaminitis + bilateral leg weakness + right paresthesias, [facility] ER visit 3/2 for bilateral leg weakness who presents to [facility] with difficulty standing and walking, bilateral leg/hand weakness, bilateral leg/hand/face paresthesias. She noted bilateral leg weakness with her prior [facility] admission 2/7-2/17 and was readmitted for bilateral leg weakness 2/18-2/20. The past 4 days she has had difficulty standing and walking. The past 4 days she notes new bilateral hand and face paresthesias and bilateral hand weakness. She has some shortness of breath but is speaking in full sentences. She went to [facility] ER 3/2 and was discharged. She saw her PMD who advised she go to an [facility]. At [facility] she got MRI brain and CTL-spine which did not show a cause for her symptoms. [facility] called neurology Dr. Francesco Sor who said to consider IVIG for 5 days, LP, EMG. The LP was done at [facility]. Dr. Francesco Sor advised transfer to [facility]. These symptoms are sudden onset, moderate intensity,  without alleviating factors.  Following admission, patient was seen by neurology consult who performed MRI of the entire spine and brain, without contrast which came back to show no major issues except on the lumbar area L4-L5 disc disease with facet arthrosis and degenerative changes, neurology team recommended LP and CSF studies so far are unrevealing, patient continued to complain of having bandlike pain around the epigastric area & in the back, there was tenderness on the right side of the thoracic vertebral area in the muscle, Lidoderm patch applied, patient is getting Protonix and Maalox, has not had a bowel movement for that reason given lactulose and stool softeners.  Patient was [facility] for consideration of IVIG with a suspicion of GBS, EMG currently pending.   Due to her history of not eating well and has been losing weight over the past few weeks, neurology team felt may be nutritional and started on on IV thiamine, patient has been requesting narcotics for the pain that she is got but not really having any tenderness on examination.  Patient did NIF and vital capacity on the morning of 12/26/2021 which came back -12 cm H2O and vital capacity 1.5 L.  Patient is not in any respiratory distress, not needing any oxygen at this time.  Patient had additional laboratory studies including HIV and syphilis which came back negative, negative for COVID 19, potassium was noted to be low for which replacement was given, CPK slightly elevated at 239.  Cultures remain negative.   Patient is A&O X 4 and following commands, she is tolerating a regular  diet, last BM was 3/11. No hearing problems, wears glasses for reading. Lives with daughter is usually able to walk around the house, uses a RW for community level ambulation, patient usually requires help from her daughter for LE bathing but otherwise independent. Patient with IJ Line which was placed for PLEX, however no longer going to have PLEX  so it will be discontinued. Patient is working with PT and OT and would benefit from inpatient AR to get her as close to her pre-morbid status as possible and to manage his co-morbid conditions as above.     Subjective     Patient/Caregiver Goals: I want to be independent        Objective          Mobility Functional Status:   Current   Status Current Status Discharge Goal   Functional Area: Care Score:  Comments:    Roll Left and Right 4 Flat bed, use of handrails on bed Independent   Sit to Lying 4 Flat bed, use of handrails on bed Independent   Lying to Sitting on Side of Bed 4 Flat bed, use of handrails on bed Independent   Sit to Stand 4 RW Independent   Chair/Bed-to-Chair Transfer 4 RW Independent   Car Transfer 88    Independent   Walk 10 Feet 3 Min A with RW, gait belt, close chair follow Independent   Walk 50 Feet with Two Turns 88    Independent   Walk 10 Feet on Uneven Surface 88    Independent   Walk 150 Feet 88    Independent   1 Step (Curb) 88    Supervision or touching assistance   4 Steps 88    Supervision or touching assistance   12 Steps 88    Supervision or touching assistance   Wheel 50 Feet with Two Turns 4    Not applicable   Wheel 150 Feet 4    Not applicable         Encounter Problems       Encounter Problems (Active)       PT - General and Outcome Measures       STG: Patient will tolerate Berg Balance Assessment (Adequate for Discharge)       Start:  01/04/22    Expected End:  01/11/22            STG: Patient will tolerate (Adequate for Discharge)       Start:  01/04/22    Expected End:  01/11/22            STG: Patient will tolerate (Adequate for Discharge)       Start:  01/04/22    Expected End:  01/11/22            STG: Patient will tolerate entire therapy session OOB. (Completed)       Start:  01/04/22    Expected End:  01/11/22    Resolved:  01/17/22         STG: Patient will tolerate a stair, curb and ramp assessment.  (Adequate for Discharge)       Start:  01/04/22     Expected End:  01/11/22               PT - General and Outcome Measures       LTG: Patient will complete transfers from bed <> chair with recommended DME, independent assistance in order to maximize independence and prevent falls upon  [place] home. (Adequate for Discharge)       Start:  01/04/22            LTG: Patient will walk 150 ft with recommended DME, independent assistance in order to access areas of their home and community upon [place] home. (Adequate for Discharge)       Start:  01/04/22            LTG: Patient will walk on uneven surfaces with recommended DME, independent assistance in order to access areas of their home and community upon [place] home. (Adequate for Discharge)       Start:  01/04/22            LTG: Patient will ascend/descend 1 FOS with recommended DME, steadying assistance in order to safely and independently access their home/community (Adequate for Discharge)       Start:  01/04/22            LTG: Patient/caregiver will return safe demonstration of proper supervision/assistance level for all mobility related needs in order to maximize safety upon transition to next level of care (Adequate for Discharge)       Start:  01/04/22                   Education Documentation  No documentation found.  Education Comments  No comments found.        Assessment & Plan   Summary of Progress and Current Status: Pt has made limited progress throughout her ARF due to weakness, poor endurance, and medical concerns including volatile BP and elevated HR. She continues to show significant deficits in strength and balance and has high anxiety with regards to standing activity including gait training. She currently requires min-mod A to ambulate 10' with RW with close w/c follow for safety as pt tends to fall and sit suddenly. Pt will benefit from discharge to SNF to continue to address strength impairments and reduce burden of care prior to [place] home.     Plan:  Risks/Benefits/POC Discussed with Pt/Family: With  patient  Treatment/Interventions: Gait training, Exercise, Stair training, Neuromuscular re-education, Functional transfer training, LE strengthening/ROM, Endurance training, Patient/family training, Equipment eval/education, Bed mobility, Compensatory technique education, Continued evaluation    Recommendation:  Discharge Recommendation: SNF  DME Recommended for Discharge: Front wheel walker  PT Therapy Recommendation: 5-6 days/week, 60-120 mins/day, 1:1 treatment  PT Estimated Length of Stay: 10-14 days from IE on 01/04/2022

## 2022-01-26 NOTE — Plan of Care (Addendum)
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

## 2022-01-26 NOTE — EDIE (Signed)
COLLECTIVE?NOTIFICATION?01/26/2022 21:03?[name], [name]?MRN: [id number]    Criteria Met      5 ED Visits in 12 Months    3 Different Facilities in 90 Days    Security and Safety  No Security Events were found.  ED Care Guidelines  There are currently no ED Care Guidelines for this patient. Please check your facility's medical records system.        Prescription Monitoring Program  390??- Narcotic Use Score  330??- Sedative Use Score  000??- Stimulant Use Score  320??- Overdose Risk Score  - All Scores range from 000-999 with 75% of the population scoring < 200 and on 1% scoring above 650  - The last digit of the narcotic, sedative, and stimulant score indicates the number of active prescriptions of that type  - Higher Use scores correlate with increased prescribers, pharmacies, mg equiv, and overlapping prescriptions  - Higher Overdose Risk Scores correlate with increased risk of unintentional overdose death   Concerning or unexpectedly high scores should prompt a review of the PMP record; this does not constitute checking PMP for prescribing purposes.    E.D. Visit Count (12 mo.)  Facility Visits   [facility] 10   [place] - [facility] 1   [place] 14   [place] [facility] 2   Total 27   Note: Visits indicate total known visits.     Recent Emergency Department Visit Summary  Showing 10 most recent visits out of 27 in the past 12 months   Date Facility [facility] Type Diagnoses or Chief Complaint    Jan 26, 2022  [place] [facility]  [place]  Emergency      triage      Dec 25, 2021  [place] [facility]  [place]  Emergency      lower extremity weakness      Other symptoms and signs involving the musculoskeletal system      Dec 24, 2021  [place] [facility]  [name]  [place]  Emergency      Numbess; Drooling      Chest Pain      Numbness      Headache      Other symptoms and signs involving the musculoskeletal system      Dec 23, 2021  [facility]   [name]  [place]  Emergency      RS/ WEAKNESS      Weakness      1. Urinary tract infection, site not specified      2. Nausea      2. Other specified soft tissue disorders      3. Chest pain, unspecified      3. Dysuria      5. Tachycardia, unspecified      7. Contact with and (suspected) exposure to COVID-19      8. Paroxysmal atrial fibrillation      Dec 11, 2021  [facility] [place]  [name]  Emergency      rs/ reaction to medication      Paresthesia of skin      Hypokalemia      Nov 30, 2021  [facility] [place]  [name]  Emergency      RS/ CHEST PAIN      Constipation, unspecified      Rhabdomyolysis      Nausea with vomiting,  unspecified      Nov 28, 2021  [facility] [place] .  [place]  Emergency      gastric and bowels, PCP told to come in      Generalized abdominal pain      Constipation, unspecified      1. Nausea with vomiting, unspecified      2. Unspecified abdominal pain      2. Abnormal weight loss      3. Type 2 diabetes mellitus without complications      3. Shortness of breath      4. Chronic obstructive pulmonary disease, unspecified      4. Hypertensive heart disease with heart failure      Nov 26, 2021  [facility] [place]  [name]  [place]  Emergency      ABNORMAL LABS-LEG PAIN-FROM DR. WILLIAMS      Rhabdomyolysis      1. Pain in left lower leg      2. Pain in right lower leg      2. Other chest pain      3. Hemiplegia and hemiparesis following cerebral infarction affecting left non-dominant side      4. Essential (primary) hypertension      5. Paroxysmal atrial fibrillation      6. Type 2 diabetes mellitus without complications      7. Chronic obstructive pulmonary disease, unspecified      Nov 18, 2021  [facility] [place]  [name]  [place]  Emergency      CHEST PAIN      Chest pain, unspecified      Nov 13, 2021  [facility]  [place]  Emergency      HAVENT HAD A BOWEL MOVEMENT IN A MONTH. FEELING WEAK      Constipation, unspecified       1. Unspecified abdominal pain      2. Nausea      2. Essential (primary) hypertension      3. Chronic obstructive pulmonary disease, unspecified      4. Type 2 diabetes mellitus without complications      5. Paroxysmal atrial fibrillation      6. Hyperlipidemia, unspecified      7. Bipolar disorder, unspecified        Recent Inpatient Visit Summary  Date Facility [facility] Type Diagnoses or Chief Complaint    Dec 25, 2021  [place] [facility]  [place]  [place]  Neurology      Other symptoms and signs involving the musculoskeletal system      Guillain-Barre syndrome      Nov 30, 2021  [facility] [place]  [name]  Family Practice      Constipation, unspecified      1. Nausea with vomiting, unspecified      1. Hypokalemia      2. Cerebral infarction, unspecified      2. Unspecified abdominal pain      3. Shortness of breath      3. Hemiplegia and hemiparesis following cerebral infarction affecting left non-dominant side      4. Rhabdomyolysis      5. Other disorders of phosphorus metabolism      6. Epilepsy, unspecified, not intractable, without status epilepticus      Aug 24, 2021  [facility] [place]  [name]  [place]  Family Practice  Acute renal failure (ARF) (HCC)      1. Acute kidney failure, unspecified      2. Other chest pain      2. Hemiplegia and hemiparesis following cerebral infarction affecting left non-dominant side      3. Pain in left shoulder      3. Hypotension, unspecified      4. Nausea      4. Thoracic aortic aneurysm, without rupture, unspecified      5. Type 2 diabetes mellitus without complications      6. Epilepsy, unspecified, not intractable, without status epilepticus      Jun 29, 2021  [facility] [place]  [name]  [place]  General Medicine      Chest pain, unspecified type      Apr 21, 2021  [facility] [place]  [name]  [place]  Family Practice      HEADACHE      LEFT SIDED WEAKNESS      Left sided numbness      Facial paresthesia      Left-sided weakness       Mar 11, 2021  [facility] [place]  [name]  [place]  General Medicine      FACIAL DROOP      1. Facial weakness      2. Hemiplegia and hemiparesis following cerebral infarction affecting left non-dominant side      3. Paroxysmal atrial fibrillation      4. Long term (current) use of anticoagulants      5. Pure hypercholesterolemia, unspecified      6. Obesity, unspecified      7. Body mass index [BMI] 37.0-37.9, adult      8. Chronic obstructive pulmonary disease, unspecified      9. Gastro-esophageal reflux disease without esophagitis        Care Team  Provider Specialty Phone Fax Service Dates   Santa Lighter, M.D. Internal Medicine   Current    [name] , M.D. Family Medicine [phone]  Current      Collective Portal  This patient has registered at the [facility] [place] Emergency Department   For more information visit: [email or web address]     PLEASE NOTE:     1.   Any care recommendations and other clinical information are provided as guidelines or for historical purposes only, and providers should exercise their own clinical judgment when providing care.    2.   You may only use this information for purposes of treatment, payment or health care operations activities, and subject to the limitations of applicable Collective Policies.    3.   You should consult directly with the organization that provided a care guideline or other clinical history with any questions about additional information or accuracy or completeness of information provided.    ? 2023 [place] - [email or web address]

## 2022-01-26 NOTE — ED Notes (Signed)
[facility] EMERGENCY DEPT  ED NURSING NOTE FOR THE RECEIVING INPATIENT NURSE   ED NURSE Juliann Olesky/Jessica   [place] [address]   ED CHARGE RN [phone]   ADMISSION INFORMATION   [name] is a 66 y.o. female admitted with an ED diagnosis of:    1. Stiff person syndrome         Isolation: None   Allergies: Singulair [montelukast], Immune globulin (human), Myoview [technetium-19m], Contrast [iodinated contrast media], Latex, Magnesium chloride, Nitroglycerin, Penicillins, and Tetracyclines & related   Holding Orders confirmed? No   Belongings Documented? No   Home medications sent to pharmacy confirmed? No   NURSING CARE   Patient Comes From:   Mental Status: Acute Care Facility  alert   ADL: Needs assistance with ADLs   Ambulation: 1 person assist   Pertinent Information  and Safety Concerns:     [place] Violence Risk Level: Low 66 y.o. female d/c from rehab now called back by Dr Joyce Gross neuro for +  GAB65 and needs plasmapheresis.  The only 1 CK tonight and they will do plasmapheresis in the morning       CT / NIH   CT Head ordered on this patient?  No   NIH/Dysphagia assessment done prior to admission? No   VITAL SIGNS (at the time of this note)      Vitals:    01/26/22 2245   BP: 119/70   Pulse: (!) 113   Resp: 21   Temp:    SpO2: 100%

## 2022-01-26 NOTE — Plan of Care (Signed)
See my note for readmission 4/5, 01/27/22

## 2022-01-26 NOTE — Progress Notes (Signed)
Pt discharge to SNF per Md's order. Instructions given to patient along with copy of chart and prescriptions. Called [facility] and gave report. All belongings taken along with dentures, Transported via wheel chair. Daughter made aware of mom leaving.

## 2022-01-26 NOTE — Plan of Care (Signed)
Patient alert and oriented x4, continues on pain management with prn dilaudid. Routine medications given as ordered  and patient tolerated well.  Problem: Bladder Management  Goal: LTG: Pt will be 100% continent and independent with bladder elim at [place]  Outcome: Progressing     Problem: Cardiac Function  Goal: LTG: Pt will be independent with BP and pulse management and medication at [place]  Outcome: Progressing     Problem: Pain Management  Goal: LTG: Pt will achieve good pain control with current medication regimen as evidenced by ability to participate in 100% of therapy sessions.  Outcome: Progressing     Problem: Safety Risk  Goal: LTG: Pt will recognize limitations and use call bell 100% of the time prior to transfers in order to prevent fsalls  Outcome: Progressing

## 2022-01-26 NOTE — Progress Notes (Signed)
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 The patient was found seated in her wheelchair. She was alert and cooperative. [name] was oriented to person, place, time, date and situation. Affect was euthymic, and she made appropriate eye contact. She was engaging throughout the session and demonstrated spontaneous speech. Personal grooming and hygiene were good. Speech was of normal rhythm, volume, rate, and prosody.  Processing speed was within normal limits. Behavioral signs of pain were not observed. There was no evidence of her responding to internal stimuli, and there was no delusional content in her speech. At the time of the session, the patient did not appear to be in or report any acute distress. Thought process was linear and goal oriented; content was appropriate. Patient did not endorse any suicidal ideation, intent, and/or plan.      Assessment  Impressions: During this session, the patient discussed her progress with rehabilitation and managing fear in her sessions. Techniques and strategies for anxiety management were discussed. The patient also processed her next steps in rehabilitation and transitioning to a SNF.  Overall, she reported improving adjustment to her medical condition and recovery process, and anxiety continues to affect safe ambulation.     Plan  Increase adaptive coping and adjustment to functional abilities and he rehabilitation process.

## 2022-01-26 NOTE — OT Progress Note (Signed)
Occupational Therapy  Inpatient Rehabilitation Daily Progress Note    Patient Name:  [name]       Medical Record Number: [id number]   Date of Birth: 19-Jan-1956  Sex: Female          Room/Bed:  M501/M501-02    Therapy Received:  Start Time: 900   Stop Time: 1000  Total Therapy Minutes: 60    Rehabilitation Precautions/Restrictions:  Weight Bearing Status: no restrictions  Precaution Instructions Given to Patient: Yes  Restricted BP Precautions: other (comment)  Communication Precautions: Speak slowly  Other Precautions: falls, decreased LE strength/sensation, orthostatic hypotension    Rehab Diagnosis: 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 Handoff from previous OT and RN appreciated. Session focused on transfer training techniques specifically w/c set up and mgmt and total body strengthening.    Transfer training-Pt completed w/c set up x 2 self propelling c feet/hands. Pt required cues for arm rest mgmt and attending to tele. Initial transfer completed with several lateral scoots bed<>w/c. Pt able to verbally direct OT x 3 in assisting with mgmt of catheter and assisting with far brake (pt unable to fully lock while sitting in bed). Pt completed remaining transfers as squat pivots with close SPV.     Therapeutic Ex: x 10 ea with red t-band chest pull, biceps, elbow ext, sh diag flex. Scap retraction completed x 10 c 3s holds. Needed cues for proper technique for most exercises.  Per past  experience with pt, after about 30-35 min in w/c, pt asks "can we be done? I'm feeling funny." Pt able to sustain participation in session with encouragement but from bed level focusing on exercises.    Completed stretching for UE and LE from bed level: posterior/inf capsule stretch, protraction/retraction stretch, wrist flex/ext, finger flex/ext, hamstring stretch, ankle pumps, legs in/out to side    Hand off to RN completed. Pt left in bed with all needs in reach      Education Documentation  Functional transfers/mobility, taught by Dianne Dun, OT at 01/26/2022 12:37 PM.  Learner: Patient  Readiness: Acceptance  Method: Explanation, Demonstration  Response: Demonstrated Understanding, Verbalizes Understanding    Education Comments  No comments found.        Assessment & Plan   Pt completed session content with encouragement per usual level of tolerance. She did pretty well self-directing transfers and setting them up independently for training staff at her d/c locale. She is slated to d/c to SNF this p.m.     Plan:  Risks/Benefits/POC Discussed with Pt/Family: With patient  Treatment Interventions: ADL retraining, Functional transfer training, UE strengthening/ROM, Endurance training, Cognitive reorientation, Patient/Family training, Equipment eval/education, Neuro muscular reeducation, Fine motor coordination activities, Compensatory technique education    Recommendation:  Discharge Recommendation: SNF  OT Therapy Recommendation: 5-6 days/week, 60-120 mins/day, 1:1 treatment, Group therapy  OT Estimated Length of Stay: 14 days

## 2022-01-26 NOTE — OT Progress Note (Signed)
Occupational Therapy  Inpatient Rehabilitation Discharge Summary    Patient Name:  [name] BirksCarol [name]       Medical Record Number: [id number]   Date of Birth: 02/23/1956  Sex: Female          Room/Bed:  M501/M501-02    Therapy Received:  Start Time: 0800   Stop Time: 0900  Total Therapy Minutes: 60    Rehabilitation Precautions/Restrictions:  Weight Bearing Status: no restrictions  Precaution Instructions Given to Patient: Yes  Restricted BP Precautions: other (comment)  Communication Precautions: Speak slowly  Other Precautions: falls, decreased LE strength/sensation, orthostatic hypotension    Rehab Diagnosis: 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 year old right hand-dominant female   hx afib on Eliquis, HTN, HLD, DM, COPD, TIAs, COPD, bipolar disorder, [facility] admit 2/7-2/17/23 for rhabdomyolysis (peak CK 4464 - statin d/c'ed) + vomiting ?gastroparesis + starvation ketoacidosis + UTI, [facility] admit 2/18-2/20/23 for rhabdomyolysis (peak CK 2509) + transaminitis + bilateral leg weakness + right paresthesias, [facility] ER visit 3/2 for bilateral leg weakness who [facility] with difficulty standing and walking, bilateral leg/hand weakness, bilateral leg/hand/face paresthesias. She noted bilateral leg weakness with her prior [facility] admission 2/7-2/17 and was readmitted for bilateral leg weakness 2/18-2/20. The past 4 days she has had difficulty standing and walking. The past 4 days she notes new bilateral hand and face paresthesias and bilateral hand weakness. She has some shortness of breath but is speaking in full sentences. [facility] and was discharged. She saw her PMD who advised she go to [facility]. [facility] she got MRI brain and CTL-spine which did not show a cause for her symptoms. [facility] called neurology Dr. Francesco SorKhosla who said to consider IVIG for 5 days, LP, EMG. The [facility]. Dr. Frances[facility]la [facility]. These  symptoms are sudden onset, moderate intensity, without alleviating factors.  Following admission, patient was seen by neurology consult who performed MRI of the entire spine and brain, without contrast which came back to show no major issues except on the lumbar area L4-L5 disc disease with facet arthrosis and degenerative changes, neurology team recommended LP and CSF studies so far are unrevealing, patient continued to complain of having bandlike pain around the epigastric area & in the back, there was tenderness on the right side of the thoracic vertebral area in the muscle, Lidoderm patch applied, patient is getting Protonix and Maalox, has not had a bowel movement for that reason given lactulose and stool softeners.  Patient was sent from enema multiple hospital for consideration of IVIG with a suspicion of GBS, EMG currently pending.   Due to her history of not eating well and has been losing weight over the past few weeks, neurology team felt may be nutritional and started on on IV thiamine, patient has been requesting narcotics for the pain that she is got but not really having any tenderness on examination.  Patient did NIF and vital capacity on the morning of 12/26/2021 which came back -12 cm H2O and vital capacity 1.5 L.  Patient is not in any respiratory distress, not needing any oxygen at this time.  Patient had additional laboratory studies including HIV and syphilis which came back negative, negative for COVID 19, potassium was noted to be low for which replacement was given, CPK slightly elevated at 239.  Cultures remain negative.   Patient is A&O X  4 and following commands, she is tolerating a regular diet, last BM was 3/11. No hearing problems, wears glasses for reading. Lives with daughter is usually able to walk around the house, uses a RW for community level ambulation, patient usually requires help from her daughter for LE bathing but otherwise independent. Patient with IJ Line which was placed  for PLEX, however no longer going to have PLEX so it will be discontinued. Patient is working with PT and OT and would benefit from inpatient AR to get her as close to her pre-morbid status as possible and to manage his co-morbid conditions as above.      Patient was admitted to acute rehab on 01/03/2022     Subjective   Patient Report: "So, what is the rest of my day going to look like?"  Patient/Caregiver Goals: I want to be independent  Pain Assessment:  Pain Assessment: No/denies pain    Objective   Vitals:  Heart Rate: (!) 114  BP: 106/65  MAP (mmHg): 79    Interventions: Patient greeted upright in bed offered shower with provisions for BP management, however she preferred to complete at bed level. She completed bed level ADL with grossly set-upA and SPV for managing pants/foley. See CARE Tool for details on functional performance.     She then completed gait level t/f from wheelchair<>NuStep with CGA and VC for sequence/technique of transfer. She completed 6 mins total on NuStep with 2 rest breaks on level 0-2 with verbal encouragement to continue despite fatigue onset.    Self Care Functional Status:   Current Status Current Status Discharge Goal   Functional Area: Care Score: Comments:    Eating 6    Independent   Oral Hygiene 6 Seated in wheelchair Independent   Toileting Hygiene 4 Bed level SPV; minA at [facility] Independent   Shower/Bathe Self 5 Bed level soap and water; unsafe for full shower d/t hypotension Supervision or touching assistance   Upper Body Dressing 6    Independent   Lower Body Dressing 4 VC for foley management, SPV at bed level Supervision or touching assistance   Putting On/Taking Off Footwear 3 Assistance for TED hose, IND for all other footwear needs Supervision or touching assistance     Mobility Functional Status:   Current   Status Current Status Discharge Goal   Functional Area: Care Score: Comments:    Toilet Transfer 4 SPV for squat pivot, CGA for stand pivot using RW Independent    Picking Up Object 88 Patient is unsafe to attempt at this time due to high fall risk Independent (with use of AE)     Cognition: Patient presents with likely premorbid cognitive deficits that are highly influenced by her depression, bipolar disorder, and high anxiety. She presents with memory deficits, executive function deficits, and attention deficits. See SLP notes for details.    Encounter Problems       Encounter Problems (Active)       OT General       LTG: Pt will complete LB dressing with use of compensatory strategies and AE prn with spv.  (Completed)       Start:  01/04/22    Resolved:  01/26/22         STG: Pt will complete LB dressing with mod A with use of compensatory strategies and AE prn with spv.  (Completed)       Start:  01/04/22    Expected End:  01/11/22    Resolved:  01/11/22         LTG: Pt will increase dynamic standing balance and upright tolerance to allow pt to advance pants over hips without physical assistance in standing.  (Adequate for Discharge)       Start:  01/04/22            LTG: Pt will complete LB bathing with use of compensatory strategies and AE prn with spv.  (Completed)       Start:  01/04/22    Resolved:  01/26/22         LTG: Pt will complete tub transfers with use of recommended DME and AE prn with spv.  (Adequate for Discharge)       Start:  01/04/22            LTG: Pt will perform UE HEP with occasional verbal cues.  (Completed)       Start:  01/04/22    Resolved:  01/26/22         STG: Pt will increase dynamic standing balance to fair +.  (Adequate for Discharge)       Start:  01/04/22                   Education Documentation  Muscle weakness, taught by Davonna Belling, OT at 01/26/2022 12:26 PM.  Learner: Patient  Readiness: Acceptance  Method: Explanation, Demonstration, Teach Back  Response: Verbalizes Understanding, Demonstrated Understanding, Able to Teach Back, Needs Reinforcement    Emotional adjustments/depression, taught by Davonna Belling, OT at 01/26/2022  12:26 PM.  Learner: Patient  Readiness: Acceptance  Method: Explanation, Demonstration, Teach Back  Response: Verbalizes Understanding, Demonstrated Understanding, Able to Teach Back, Needs Reinforcement    Reconditioning, taught by Davonna Belling, OT at 01/26/2022 12:26 PM.  Learner: Patient  Readiness: Acceptance  Method: Explanation, Demonstration, Teach Back  Response: Verbalizes Understanding, Demonstrated Understanding, Able to Teach Back, Needs Reinforcement    Verbalize detrimental effects of bedrest/inactivity, taught by Davonna Belling, OT at 01/26/2022 12:26 PM.  Learner: Patient  Readiness: Acceptance  Method: Explanation, Demonstration, Teach Back  Response: Verbalizes Understanding, Demonstrated Understanding, Able to Teach Back, Needs Reinforcement    Signs/symptoms associated with debility, taught by Davonna Belling, OT at 01/26/2022 12:26 PM.  Learner: Patient  Readiness: Acceptance  Method: Explanation, Demonstration, Teach Back  Response: Verbalizes Understanding, Demonstrated Understanding, Able to Teach Back, Needs Reinforcement    Safety issues and interventions, taught by Davonna Belling, OT at 01/26/2022 12:26 PM.  Learner: Patient  Readiness: Acceptance  Method: Explanation, Demonstration, Teach Back  Response: Verbalizes Understanding, Demonstrated Understanding, Able to Teach Back, Needs Reinforcement    Rehab techniques/procedure, taught by Davonna Belling, OT at 01/26/2022 12:26 PM.  Learner: Patient  Readiness: Acceptance  Method: Explanation, Demonstration, Teach Back  Response: Verbalizes Understanding, Demonstrated Understanding, Able to Teach Back, Needs Reinforcement    Precautions, taught by Davonna Belling, OT at 01/26/2022 12:26 PM.  Learner: Patient  Readiness: Acceptance  Method: Explanation, Demonstration, Teach Back  Response: Verbalizes Understanding, Demonstrated Understanding, Able to Teach Back, Needs Reinforcement    Plan of care, taught by Davonna Belling, OT at 01/26/2022 12:26  PM.  Learner: Patient  Readiness: Acceptance  Method: Explanation, Demonstration, Teach Back  Response: Verbalizes Understanding, Demonstrated Understanding, Able to Teach Back, Needs Reinforcement    Pain management, taught by Davonna Belling, OT at 01/26/2022 12:26 PM.  Learner: Patient  Readiness: Acceptance  Method: Explanation, Demonstration, Teach Back  Response: Verbalizes Understanding, Demonstrated Understanding, Able to Teach Back, Needs  Reinforcement    Orthotics/prosthetics, taught by Davonna Belling, OT at 01/26/2022 12:26 PM.  Learner: Patient  Readiness: Acceptance  Method: Explanation, Demonstration, Teach Back  Response: Verbalizes Understanding, Demonstrated Understanding, Able to Teach Back, Needs Reinforcement    Home management activities, taught by Davonna Belling, OT at 01/26/2022 12:26 PM.  Learner: Patient  Readiness: Acceptance  Method: Explanation, Demonstration, Teach Back  Response: Verbalizes Understanding, Demonstrated Understanding, Able to Teach Back, Needs Reinforcement    Home exercise program, taught by Davonna Belling, OT at 01/26/2022 12:26 PM.  Learner: Patient  Readiness: Acceptance  Method: Explanation, Demonstration, Teach Back  Response: Verbalizes Understanding, Demonstrated Understanding, Able to Teach Back, Needs Reinforcement    Equipment, taught by Davonna Belling, OT at 01/26/2022 12:26 PM.  Learner: Patient  Readiness: Acceptance  Method: Explanation, Demonstration, Teach Back  Response: Verbalizes Understanding, Demonstrated Understanding, Able to Teach Back, Needs Reinforcement    Driving, taught by Davonna Belling, OT at 01/26/2022 12:26 PM.  Learner: Patient  Readiness: Acceptance  Method: Explanation, Demonstration, Teach Back  Response: Verbalizes Understanding, Demonstrated Understanding, Able to Teach Back, Needs Reinforcement    Community resources, taught by Davonna Belling, OT at 01/26/2022 12:26 PM.  Learner: Patient  Readiness: Acceptance  Method: Explanation,  Demonstration, Teach Back  Response: Verbalizes Understanding, Demonstrated Understanding, Able to Teach Back, Needs Reinforcement    Cognitive function, taught by Davonna Belling, OT at 01/26/2022 12:26 PM.  Learner: Patient  Readiness: Acceptance  Method: Explanation, Demonstration, Teach Back  Response: Verbalizes Understanding, Demonstrated Understanding, Able to Teach Back, Needs Reinforcement    Bowel and bladder management, taught by Davonna Belling, OT at 01/26/2022 12:26 PM.  Learner: Patient  Readiness: Acceptance  Method: Explanation, Demonstration, Teach Back  Response: Verbalizes Understanding, Demonstrated Understanding, Able to Teach Back, Needs Reinforcement    ADL retraining, taught by Davonna Belling, OT at 01/26/2022 12:26 PM.  Learner: Patient  Readiness: Acceptance  Method: Explanation, Demonstration, Teach Back  Response: Verbalizes Understanding, Demonstrated Understanding, Able to Teach Back, Needs Reinforcement    Education Comments  No comments found.        Assessment & Plan   Summary of Progress and Current Status: Ms. Rashad has made fair progress during her hospital stay which has been severely limited by her experience with orthostatic hypotension and fear of falling and/or passing out. She has demonstrated increased strength, standing tolerance, standing balance, and independence with self care needs. She has some self limiting behaviours which are improved with a psychosocial approach using grounding techniques. She would benefit from d/c to SNF to maximize her functional independence at a more tolerable pace to decrease burden of care in prep for d/c home.     Plan:  Risks/Benefits/POC Discussed with Pt/Family: With patient  Treatment Interventions: ADL retraining, Functional transfer training, UE strengthening/ROM, Endurance training, Cognitive reorientation, Patient/Family training, Equipment eval/education, Neuro muscular reeducation, Fine motor coordination activities, Compensatory  technique education    Recommendation:  Discharge Recommendation: SNF  DME Recommended for Discharge: No additional equipment/DME recommended at this time  OT Therapy Recommendation: 5-6 days/week, 60-120 mins/day, 1:1 treatment, Group therapy  OT Estimated Length of Stay: 14 days

## 2022-01-26 NOTE — Progress Notes (Signed)
Chaplain Service      Background:  Visit Type: Follow-up was made by Chaplain with patient, [name], based on Source: Chaplain Initiated.  Present at Visit: Patient, Family members.  Spiritual Care Provided to: patient, family members.    .    Summary:  Spiritual Care Interventions: Provided prayer, Provided spiritual encouragement, Provided comfort, encouragement,affirmation  Reason for Visit: Emotional Support, Spiritual Support   Spiritual Care Outcomes: Patient expressed appreciation of visit, Family expressed appreciation of visit

## 2022-01-26 NOTE — Progress Notes (Addendum)
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   Rm: 200  Report: [phone] ext [phone] or [phone]  Admissions: [name]  HM Transport: [phone]  Pick up: 4:00PM    RECOMMENDED FOLLOW UP  Follow up with Malcolm Metro, MD  PCP follow up within 1 week.   [facility]  [address]   [place] [address]  [phone]           Follow up with [facility]   [address]   [place] [address]   [phone]           Follow up with Marlaine Hind, MD   [facility]  [address]   [place] [address]  [phone]           Follow up with Miachel Roux, MD   [facility]  [address]   [place] [address]  [phone]    MYCHART:  Please visit MyChart to access your medical record including your inpatient rehabilitation discharge summaries: [email or web address]    HELPFUL PHONE NUMBERS:  [facility], [facility]: [phone]  [facility]: [phone]    RESOURCES:  2-1-1 [place] is a free service that can help you find local resources:  Visit www.[facility].org  Text CONNECT to 432-127-7284  Call 211    Discharge instruction reviewed by:  Rico Junker, MSW, LMSW, ACM-SW  Social Worker Case Manager II  Acute Rehab  [facility]  [address]  [place], [place] [address]  [phone]

## 2022-01-26 NOTE — ED Triage Notes (Signed)
Pt A&Ox4, was recently d/c [facility] after staying three weeks. Then today she was getting checked into [facility] when the MD called and said go to the ER for abnormal plasma and will need and exchange

## 2022-01-26 NOTE — Progress Notes (Signed)
MEDICINE PROGRESS NOTE  [facility], DIVISION OF HOSPITALIST MEDICINE   [facility]   [place] Pager: [phone]      Date Time: 01/26/22 3:32 PM  Patient Name: [name]  Attending Physician: Anastasia Pall, Edson SnowballPriscilla Chan & [facility] Day: 24  Assessment:   66 year old female with past medical history significant for diabetes, hypertension, nonischemic cardiomyopathy, paroxysmal A-fib, anemia, bipolar, TIAs presented to [facility]  hospital on 2/7 with lower extremity weakness, paresthesias, transaminitis thought to have 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 Patient was discharged on 2/17 and went back to the hospital on 2/18 for continued management of rhabdomyolysis.  Patient noted to have left-sided weakness with associated difficulty with walking, standing, also developing bilateral hand and feet paresthesias and hand weakness,she was seen by neurology, work-up including MRIs, lumbar puncture done with no clear etiology,was transferred to [facility] for consideration for IVIG.  While at [facility], first dose of IVIG was started,but apparently,she developed allergic reaction therefore IVIG was discontinued. Diagnosis at the time of discharge stayed unclear,DDx was possible statin induced myositis vs. inflammatory myositis vs. immune mediated myositis.  She was also noted to have multiple vitamin deficiencies including D, zinc, B6, iron deficiency which are being replaced.  Patient was discharged to acute rehab on 3/14.    Recommendations:   Myositis: Unclear etiology including possible statin induced myositis versus inflammatory arthritis versus immune mediated myositis  -Attempted IVIG at [facility] unsuccessful due to development of allergic reaction  -Patient's access was removed - as the initial plan to plasma exchange was cancelled. 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 role of biopsy?      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%  -Echo 12/31/2021 with EF 43%, mild global hypokinesis  -Noted per cardiology note 3/13 with no further plans for ischemic work-up as patient had cardiac catheterization in 2022 with no obstructive CAD  -outpatient follow-up for nonischemic cardiomyopathy work-up  -Repeat echo as outpatient      Hyponatremia,resolved likely hypovolemia based on low urine Na and urine osmolality   -Na 129---->134-->135-->141  -monitor BMP periodically       metabolic acidosis,resolved  -HCO3 was 12 and now 16-->18-->21-->24  -no s/o infection      Vitamin D, B6, B1, zinc deficiencies  -Continue multivitamin supplement  -Continue thiamine(received IV replacement 3/4-3/9), vitamin B6, zinc supplements       Normocytic anemia  -Hgb stable around 8.5  -Noted TSAT of 21%, ferritin 492, normal folate level on 3/10;  -normal vitamin B12 level on 3/4  -cont ferrous sulfate  -Hgb has been stable       Moderate protein calorie malnutrition  -Weight loss, was on Ozempic but discontinued  -c/w glucerna and regular diet      Paroxysmal A-fib  -currently in sinus  -cont metoprolol XL 125 mg in evening (increased from 100 mg on 3/17)  -Continue Eliquis  -Follow heart rate,in SR and ST,I personally reviewed Tele      Orthostatic hypotension  -Responding to midodrine and now  on 10 mg in am and afternoon for now (continuing metoprolol at night with tachycardia and PAF)  -c/w compression stocking and abd binder  -she is on Flomax and need to monitor her closely        Urinary  retension  -Currently has a Foley catheter again on 4/2, failed voiding trial on 3/13 and 3/27  -has been on Flomax       COPD,Not in exacerbation   -continue Symbicort  -prn duonebs  -Pt had c/o shortness of breath on 3/21-resolved  -no hypoxia noted/no fever  -Chest x-ray was done 3/21-no acute changes  -Patient subsequently felt better  -has been using her inhaler, no wheezing noted       Bipolar disorder  -Continue home meds including Seroquel, ropinirole; home latuda 120mg  daily(pt has home med)       DMII  -Hemoglobin A1C 5.3% on 3/6  -Patient was on Ozempic but stopped due to malnutrition/weight loss  -No need for sliding-scale   -Diet controlled       H/o GERD  -c/w PPI      Constipation  -resolved, c/w bowel regimen   -last BM 2 days ago per pt      DVT ppx:on Eliquis      Case was discussed with patient and RN    Pt is stable from medical stand point for [place]  Subjective/ROS/24hr events:     CC: Myositis    Patient was seen and examined at bedside,with no complaints ,last BM 3 days ago, +flatus,no abd pain or N/V,afebrile,Foley in place      Physical Exam:     Temp:  [97.3 F (36.3 C)-99.5 F (37.5 C)] 97.3 F (36.3 C)  Heart Rate:  [99-114] 112  Resp Rate:  [17-18] 18  BP: (101-125)/(63-75) 121/75    Intake/Output Summary (Last 24 hours) at 01/26/2022 1532  Last data filed at 01/26/2022 1300  Gross per 24 hour   Intake 980 ml   Output 700 ml   Net 280 ml      General: alert and awake, no distress  HEENT: anicteric, moist mucous membranes  Neck: supple, no JVD  CVS: nl S1, S2; no gallop or rub  Lungs: clear to auscultation b/l, no wheezing or rhonchi; normal resp effort  Abd: soft, non-tender, non-distended  Ext: no pedal edema, no cyanosis  Neuro: alert and awake, moving all extremities with overall weakness in LE especially   +foley since 4/2       Meds:   Medications were reviewed:  Scheduled Meds:  Current Facility-Administered Medications   Medication Dose Route Frequency    apixaban  5 mg Oral Q12H SCH     bisacodyl  10 mg Rectal Once    budesonide-formoterol  2 puff Inhalation BID    ferrous sulfate  324 mg Oral QAM W/BREAKFAST    lidocaine  1 patch Transdermal Q24H    lurasidone  120 mg Oral QPM    metoprolol succinate  125 mg Oral QHS    midodrine  10 mg Oral BID Meals    pantoprazole  40 mg Oral QAM AC    polyethylene glycol  17 g Oral Daily    QUEtiapine  100 mg Oral QHS    rOPINIRole  1 mg Oral QHS    senna-docusate  2 tablet Oral QHS    tamsulosin  0.4 mg Oral Daily after dinner    thiamine  100 mg Oral Daily    vitamin B-6  50 mg  Oral Daily    vitamin D  25 mcg Oral Daily    vitamins/minerals  1 tablet Oral Daily at 1700    zinc sulfate  220 mg Oral Daily at 1700     Continuous Infusions:  PRN Meds:.acetaminophen, albuterol-ipratropium, bisacodyl, bisacodyl, diphenhydrAMINE, gabapentin, HYDROmorphone, lactulose, LORazepam, melatonin, naloxone, promethazine  Labs/Radiology:   Imaging personally reviewed, including: all available   No results found.  No results for input(s): GLUCOSEWB in the last 24 hours.  Recent Labs   Lab 01/24/22  0628 01/20/22  0658   Sodium 141 137   Potassium 3.9 3.6   Chloride 110 106   BUN 5.0* 3.0*   Creatinine 0.5 0.5   EGFR >60.0 >60.0   Glucose 104* 96   Calcium 8.8 8.5       Recent Labs   Lab 01/24/22  0628 01/20/22  0658   WBC 6.88 7.13   Hgb 8.5* 8.1*   Hematocrit 26.9* 25.5*   Platelets 356* 339           Signed by: Richarda Osmond, MD,FACP

## 2022-01-27 ENCOUNTER — Encounter
Admission: EM | Disposition: A | Discharge status: Skilled Nursing Facility | Payer: Self-pay | Source: Home / Self Care | Attending: Internal Medicine

## 2022-01-27 HISTORY — PX: NON-TUNNELED CATH PLACEMENT (TDC): IMG2612

## 2022-01-27 LAB — CBC AND DIFFERENTIAL
Absolute NRBC: 0 10*3/uL (ref 0.00–0.00)
Basophils Absolute Automated: 0.05 10*3/uL (ref 0.00–0.08)
Basophils Automated: 0.6 %
Eosinophils Absolute Automated: 0.15 10*3/uL (ref 0.00–0.44)
Eosinophils Automated: 1.8 %
Hematocrit: 26.9 % — ABNORMAL LOW (ref 34.7–43.7)
Hgb: 8.4 g/dL — ABNORMAL LOW (ref 11.4–14.8)
Immature Granulocytes Absolute: 0.03 10*3/uL (ref 0.00–0.07)
Immature Granulocytes: 0.4 %
Instrument Absolute Neutrophil Count: 5 10*3/uL (ref 1.10–6.33)
Lymphocytes Absolute Automated: 2.66 10*3/uL (ref 0.42–3.22)
Lymphocytes Automated: 31.6 %
MCH: 25.6 pg (ref 25.1–33.5)
MCHC: 31.2 g/dL — ABNORMAL LOW (ref 31.5–35.8)
MCV: 82 fL (ref 78.0–96.0)
MPV: 8.8 fL — ABNORMAL LOW (ref 8.9–12.5)
Monocytes Absolute Automated: 0.52 10*3/uL (ref 0.21–0.85)
Monocytes: 6.2 %
Neutrophils Absolute: 5 10*3/uL (ref 1.10–6.33)
Neutrophils: 59.4 %
Nucleated RBC: 0 /100 WBC (ref 0.0–0.0)
Platelets: 371 10*3/uL — ABNORMAL HIGH (ref 142–346)
RBC: 3.28 10*6/uL — ABNORMAL LOW (ref 3.90–5.10)
RDW: 19 % — ABNORMAL HIGH (ref 11–15)
WBC: 8.41 10*3/uL (ref 3.10–9.50)

## 2022-01-27 LAB — COMPREHENSIVE METABOLIC PANEL
ALT: 12 U/L (ref 0–55)
AST (SGOT): 15 U/L (ref 5–41)
Albumin/Globulin Ratio: 0.8 — ABNORMAL LOW (ref 0.9–2.2)
Albumin: 2.5 g/dL — ABNORMAL LOW (ref 3.5–5.0)
Alkaline Phosphatase: 70 U/L (ref 37–117)
Anion Gap: 9 (ref 5.0–15.0)
BUN: 5 mg/dL — ABNORMAL LOW (ref 7.0–21.0)
Bilirubin, Total: 0.3 mg/dL (ref 0.2–1.2)
CO2: 23 mEq/L (ref 17–29)
Calcium: 8.7 mg/dL (ref 8.5–10.5)
Chloride: 106 mEq/L (ref 99–111)
Creatinine: 0.6 mg/dL (ref 0.4–1.0)
Globulin: 3.3 g/dL (ref 2.0–3.6)
Glucose: 102 mg/dL — ABNORMAL HIGH (ref 70–100)
Potassium: 3.2 mEq/L — ABNORMAL LOW (ref 3.5–5.3)
Protein, Total: 5.8 g/dL — ABNORMAL LOW (ref 6.0–8.3)
Sodium: 138 mEq/L (ref 135–145)

## 2022-01-27 LAB — ECG 12-LEAD
P Axis: 59 degrees
P-R Interval: 126 ms
Q-T Interval: 332 ms
QRS Duration: 78 ms
R Axis: 42 degrees

## 2022-01-27 LAB — BLOOD TYPE CONFIRMATION: Blood Type Confirmation: A POS

## 2022-01-27 LAB — GFR: EGFR: >60

## 2022-01-27 LAB — MAGNESIUM: Magnesium: 1.9 mg/dL (ref 1.6–2.6)

## 2022-01-27 LAB — TYPE AND SCREEN
AB Screen Gel: NEGATIVE
ABO Rh: A POS

## 2022-01-27 SURGERY — NON-TUNNELED CATH PLACEMENT
Anesthesia: Anesthesia Choice

## 2022-01-27 MED ORDER — CALCIUM CARBONATE ANTACID 500 MG PO CHEW
1000.0000 mg | CHEWABLE_TABLET | Freq: Four times a day (QID) | ORAL | Status: DC | PRN
Start: 2022-01-27 — End: 2022-02-08

## 2022-01-27 MED ORDER — VITAMIN B-6 50 MG PO TABS
50.0000 mg | ORAL_TABLET | Freq: Every day | ORAL | Status: DC
Start: 2022-01-27 — End: 2022-02-08
  Administered 2022-01-27 – 2022-02-08 (×13): 50 mg via ORAL
  Filled 2022-01-27 (×15): qty 1

## 2022-01-27 MED ORDER — TAMSULOSIN HCL 0.4 MG PO CAPS
0.4000 mg | ORAL_CAPSULE | Freq: Every day | ORAL | Status: DC
Start: 2022-01-27 — End: 2022-02-08
  Administered 2022-01-27 – 2022-02-07 (×12): 0.4 mg via ORAL
  Filled 2022-01-27 (×12): qty 1

## 2022-01-27 MED ORDER — POLYETHYLENE GLYCOL 3350 17 G PO PACK
17.0000 g | PACK | Freq: Every day | ORAL | Status: DC
Start: 2022-01-27 — End: 2022-02-08
  Administered 2022-01-27 – 2022-02-08 (×6): 17 g via ORAL
  Filled 2022-01-27 (×9): qty 1

## 2022-01-27 MED ORDER — NALOXONE HCL 0.4 MG/ML IJ SOLN (WRAP)
0.2000 mg | INTRAMUSCULAR | Status: DC | PRN
Start: 2022-01-27 — End: 2022-02-08

## 2022-01-27 MED ORDER — LABETALOL HCL 5 MG/ML IV SOLN (WRAP)
10.0000 mg | INTRAVENOUS | Status: DC | PRN
Start: 2022-01-27 — End: 2022-02-08

## 2022-01-27 MED ORDER — MELATONIN 3 MG PO TABS
3.0000 mg | ORAL_TABLET | Freq: Every evening | ORAL | Status: DC | PRN
Start: 2022-01-27 — End: 2022-02-08
  Administered 2022-02-05: 3 mg via ORAL
  Filled 2022-01-27: qty 1

## 2022-01-27 MED ORDER — ONDANSETRON 4 MG PO TBDP
4.0000 mg | ORAL_TABLET | Freq: Four times a day (QID) | ORAL | Status: DC | PRN
Start: 2022-01-27 — End: 2022-02-08
  Administered 2022-01-27 – 2022-02-03 (×2): 4 mg via ORAL
  Filled 2022-01-27 (×2): qty 1

## 2022-01-27 MED ORDER — HYDRALAZINE HCL 20 MG/ML IJ SOLN
10.0000 mg | INTRAMUSCULAR | Status: DC | PRN
Start: 2022-01-27 — End: 2022-02-08

## 2022-01-27 MED ORDER — POTASSIUM CHLORIDE CRYS ER 20 MEQ PO TBCR
0.0000 meq | EXTENDED_RELEASE_TABLET | ORAL | Status: DC | PRN
Start: 2022-01-27 — End: 2022-02-08
  Administered 2022-01-27 – 2022-02-05 (×5): 60 meq via ORAL
  Filled 2022-01-27 (×3): qty 3
  Filled 2022-01-27: qty 1
  Filled 2022-01-27: qty 3

## 2022-01-27 MED ORDER — ACETAMINOPHEN 650 MG RE SUPP
650.0000 mg | Freq: Four times a day (QID) | RECTAL | Status: DC | PRN
Start: 2022-01-27 — End: 2022-02-08

## 2022-01-27 MED ORDER — SENNOSIDES-DOCUSATE SODIUM 8.6-50 MG PO TABS
2.0000 | ORAL_TABLET | Freq: Two times a day (BID) | ORAL | Status: DC | PRN
Start: 2022-01-27 — End: 2022-01-27

## 2022-01-27 MED ORDER — ACETAMINOPHEN 325 MG PO TABS
650.0000 mg | ORAL_TABLET | Freq: Four times a day (QID) | ORAL | Status: DC | PRN
Start: 2022-01-27 — End: 2022-02-08
  Administered 2022-01-27 – 2022-02-02 (×7): 650 mg via ORAL
  Filled 2022-01-27 (×7): qty 2

## 2022-01-27 MED ORDER — LURASIDONE HCL 40 MG PO TABS
120.0000 mg | ORAL_TABLET | Freq: Every evening | ORAL | Status: DC
Start: 2022-01-27 — End: 2022-02-08
  Administered 2022-01-27 – 2022-02-07 (×12): 120 mg via ORAL
  Filled 2022-01-27 (×14): qty 3

## 2022-01-27 MED ORDER — METOPROLOL SUCCINATE ER 25 MG PO TB24
25.0000 mg | ORAL_TABLET | Freq: Two times a day (BID) | ORAL | Status: DC
Start: 2022-01-27 — End: 2022-01-30
  Administered 2022-01-27 – 2022-01-30 (×7): 25 mg via ORAL
  Filled 2022-01-27 (×7): qty 1

## 2022-01-27 MED ORDER — SODIUM CHLORIDE 0.9 % IV SOLN
INTRAVENOUS | Status: AC
Start: 2022-01-27 — End: 2022-01-27

## 2022-01-27 MED ORDER — THIAMINE (VITAMIN B1) 100 MG PO TABS (WRAP)
100.0000 mg | ORAL_TABLET | Freq: Every day | ORAL | Status: DC
Start: 2022-01-27 — End: 2022-02-08
  Administered 2022-01-27 – 2022-02-08 (×13): 100 mg via ORAL
  Filled 2022-01-27 (×13): qty 1

## 2022-01-27 MED ORDER — BUDESONIDE-FORMOTEROL FUMARATE 80-4.5 MCG/ACT IN AERO
2.0000 | INHALATION_SPRAY | Freq: Two times a day (BID) | RESPIRATORY_TRACT | Status: DC
Start: 2022-01-27 — End: 2022-01-27
  Administered 2022-01-27: 2 via RESPIRATORY_TRACT
  Filled 2022-01-27: qty 6.9

## 2022-01-27 MED ORDER — DIPHENHYDRAMINE HCL 25 MG PO CAPS
25.0000 mg | ORAL_CAPSULE | Freq: Two times a day (BID) | ORAL | Status: DC | PRN
Start: 2022-01-27 — End: 2022-02-01
  Filled 2022-01-27: qty 1

## 2022-01-27 MED ORDER — DIAZEPAM 5 MG PO TABS
5.0000 mg | ORAL_TABLET | Freq: Three times a day (TID) | ORAL | Status: DC
Start: 2022-01-27 — End: 2022-02-04
  Administered 2022-01-27 – 2022-02-04 (×23): 5 mg via ORAL
  Filled 2022-01-27 (×15): qty 1
  Filled 2022-01-27: qty 3
  Filled 2022-01-27 (×8): qty 1

## 2022-01-27 MED ORDER — LORAZEPAM 0.5 MG PO TABS
0.5000 mg | ORAL_TABLET | Freq: Every evening | ORAL | Status: DC | PRN
Start: 2022-01-27 — End: 2022-01-27

## 2022-01-27 MED ORDER — ALBUTEROL SULFATE HFA 108 (90 BASE) MCG/ACT IN AERS
2.0000 | INHALATION_SPRAY | Freq: Four times a day (QID) | RESPIRATORY_TRACT | Status: DC | PRN
Start: 2022-01-27 — End: 2022-02-08
  Filled 2022-01-27: qty 8

## 2022-01-27 MED ORDER — APIXABAN 5 MG PO TABS
5.0000 mg | ORAL_TABLET | Freq: Two times a day (BID) | ORAL | Status: DC
Start: 2022-01-27 — End: 2022-02-08
  Administered 2022-01-27 – 2022-02-08 (×24): 5 mg via ORAL
  Filled 2022-01-27 (×24): qty 1

## 2022-01-27 MED ORDER — SENNOSIDES-DOCUSATE SODIUM 8.6-50 MG PO TABS
2.0000 | ORAL_TABLET | Freq: Every evening | ORAL | Status: DC
Start: 2022-01-27 — End: 2022-02-08
  Administered 2022-01-27 – 2022-02-07 (×11): 2 via ORAL
  Filled 2022-01-27 (×11): qty 2

## 2022-01-27 MED ORDER — PANTOPRAZOLE SODIUM 40 MG PO TBEC
40.0000 mg | DELAYED_RELEASE_TABLET | Freq: Every morning | ORAL | Status: DC
Start: 2022-01-27 — End: 2022-02-08
  Administered 2022-01-27 – 2022-02-08 (×13): 40 mg via ORAL
  Filled 2022-01-27 (×13): qty 1

## 2022-01-27 MED ORDER — MIDODRINE HCL 5 MG PO TABS
10.0000 mg | ORAL_TABLET | Freq: Two times a day (BID) | ORAL | Status: DC
Start: 2022-01-27 — End: 2022-02-08
  Administered 2022-01-27 – 2022-02-08 (×26): 10 mg via ORAL
  Filled 2022-01-27: qty 2
  Filled 2022-01-27: qty 1
  Filled 2022-01-27 (×6): qty 2
  Filled 2022-01-27: qty 1
  Filled 2022-01-27 (×12): qty 2
  Filled 2022-01-27: qty 1
  Filled 2022-01-27 (×5): qty 2

## 2022-01-27 MED ORDER — VITAMINS/MINERALS PO TABS
1.0000 | ORAL_TABLET | Freq: Every day | ORAL | Status: DC
Start: 2022-01-27 — End: 2022-02-08
  Administered 2022-01-27 – 2022-02-08 (×13): 1 via ORAL
  Filled 2022-01-27 (×13): qty 1

## 2022-01-27 MED ORDER — MAGNESIUM SULFATE IN D5W 1-5 GM/100ML-% IV SOLN
1.0000 g | INTRAVENOUS | Status: DC | PRN
Start: 2022-01-27 — End: 2022-02-08

## 2022-01-27 MED ORDER — VITAMIN D 25 MCG (1000 UT) PO TABS
25.0000 ug | ORAL_TABLET | Freq: Every day | ORAL | Status: DC
Start: 2022-01-27 — End: 2022-02-08
  Administered 2022-01-27 – 2022-02-08 (×13): 25 ug via ORAL
  Filled 2022-01-27 (×13): qty 1

## 2022-01-27 MED ORDER — LIDOCAINE HCL 1 % IJ SOLN
INTRAMUSCULAR | Status: AC | PRN
Start: 2022-01-27 — End: 2022-01-27
  Administered 2022-01-27: 4 mL

## 2022-01-27 MED ORDER — ROPINIROLE HCL 0.5 MG PO TABS
0.5000 mg | ORAL_TABLET | Freq: Every evening | ORAL | Status: DC
Start: 2022-01-27 — End: 2022-02-08
  Administered 2022-01-27 – 2022-02-07 (×12): 0.5 mg via ORAL
  Filled 2022-01-27 (×14): qty 1

## 2022-01-27 MED ORDER — HYDROMORPHONE HCL 2 MG PO TABS
2.0000 mg | ORAL_TABLET | Freq: Four times a day (QID) | ORAL | Status: DC | PRN
Start: 2022-01-27 — End: 2022-02-08
  Administered 2022-01-27 – 2022-02-08 (×9): 2 mg via ORAL
  Filled 2022-01-27 (×9): qty 1

## 2022-01-27 MED ORDER — ZINC SULFATE 220 (50 ZN) MG PO CAPS
220.0000 mg | ORAL_CAPSULE | Freq: Every day | ORAL | Status: DC
Start: 2022-01-27 — End: 2022-02-08
  Administered 2022-01-27 – 2022-02-08 (×13): 220 mg via ORAL
  Filled 2022-01-27 (×15): qty 1

## 2022-01-27 MED ORDER — LIDOCAINE HCL 1 % IJ SOLN
INTRAMUSCULAR | Status: AC | PRN
Start: 2022-01-27 — End: 2022-01-27
  Administered 2022-01-27: 3 mL

## 2022-01-27 MED ORDER — BUDESONIDE-FORMOTEROL FUMARATE 80-4.5 MCG/ACT IN AERO
2.0000 | INHALATION_SPRAY | Freq: Two times a day (BID) | RESPIRATORY_TRACT | Status: DC
Start: 2022-01-27 — End: 2022-02-08
  Administered 2022-01-27 – 2022-02-08 (×17): 2 via RESPIRATORY_TRACT
  Filled 2022-01-27: qty 6.9

## 2022-01-27 MED ORDER — ONDANSETRON HCL 4 MG/2ML IJ SOLN
4.0000 mg | Freq: Four times a day (QID) | INTRAMUSCULAR | Status: DC | PRN
Start: 2022-01-27 — End: 2022-02-08
  Administered 2022-02-04 – 2022-02-07 (×5): 4 mg via INTRAVENOUS
  Filled 2022-01-27 (×5): qty 2

## 2022-01-27 MED ORDER — POTASSIUM CHLORIDE 20 MEQ PO PACK
0.0000 meq | PACK | ORAL | Status: DC | PRN
Start: 2022-01-27 — End: 2022-02-08

## 2022-01-27 SURGICAL SUPPLY — 9 items
GUIDEWIRE VASC SS PTFE STRG TPR AMPTZ (Guidwire) ×1
GUIDEWIRE VASCULAR OD.035 IN L80 CM L7 (Guidwire) ×1
GUIDEWIRE VASCULAR OD.035 IN L80 CM L7 CM AMPLATZ STRAIGHT TAPER (Guidwire) ×1 IMPLANT
KIT INTRO .018IN STD MRT MK 4FR 21GA 10 (Introducer) ×1
KIT INTRODUCER L10 CM .018 IN STANDARD (Introducer) ×1
KIT INTRODUCER L10 CM .018 IN STANDARD GUIDEWIRE NEEDLE MINI ACCESS (Introducer) ×1 IMPLANT
SET CATH PU STRG DUO-SPLT 13FR 15CM BSC (Catheter) ×1 IMPLANT
SET CATHETER L15 CM STRAIGHT BASIC SPLIT (Catheter) ×1 IMPLANT
SET CATHETER L15 CM STRAIGHT BASIC SPLIT TIP 2 STYLET 2 D INTERNAL (Catheter) ×1 IMPLANT

## 2022-01-27 NOTE — Progress Notes (Signed)
Received inpatient  to [facility] room 9  for Quinton cath placement  , pt is alert and oriented x 3, patient oriented to unit and plan of care. Pt has been NPO since midnight last night , pt's safety maintained . Call bell within pt reach.      PreMena Goes cath placement Teaching and Learning Objectives   Learner: [name],   Preference for learning: Verbal  Teaching Method: Verbal Instruction   Outcome of Learning: Fully Achieved    Described/Demonstrated the following:     + Responsibilities of patient's care  + Quinton cath placement   + Purpose of procedure  + Need to be NPO pre-procedure  + Need for maintaining bedrest & Monitor cath site  post-procedure & pain management   + Necessary fluid intake after procedure  + Symptoms of bleeding & states plan to notify nurse.

## 2022-01-27 NOTE — Progress Notes (Signed)
4 eyes in 4 hours pressure injury assessment note:      Completed with: Becca RN  Unit & Time admitted: 0500, 4/6            Bony Prominences: Check appropriate box; if wound is present enter wound assessment in LDA     Occiput:                 [x] WNL  []  Wound present  Face:                     [x] WNL  []  Wound present  Ears:                      [x] WNL  []  Wound present  Spine:                    [x] WNL  []  Wound present  Shoulders:             [x] WNL  []  Wound present  Elbows:                  [x] WNL  []  Wound present  Sacrum/coccyx:     [x] WNL  []  Wound present  Ischial Tuberosity:  [x] WNL  []  Wound present  Trochanter/Hip:      [x] WNL  []  Wound present  Knees:                   [x] WNL  []  Wound present  Ankles:                   [x] WNL  []  Wound present  Heels:                    [x] WNL  []  Wound present  Other pressure areas:  []  Wound location       Device related: []  Device name:         LDA completed if wound present: yes/no  Consult WOCN if necessary    Other skin related issues, ie tears, rash, etc, document in Integumentary flowsheet

## 2022-01-27 NOTE — H&P (Signed)
BRIEF VIR H&P    Date Time: 01/27/22 2:26 PM    PROCEDURALIST COMMENTS BELOW:   Plan for quinton     Risk, benefits and alternatives discussed. Risks include, but are not limited to, bleeding/infection/adjacent organ injury that can be life threatening if severe. All questions answered. Informed consent obtained.     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 Saturation fine, bp elevated.    Myoview [Technetium-68m] Itching    Atorvastatin Other (See Comments)     Pt states she thinks it caused her to have rhabdo    Contrast [Iodinated Contrast Media]     Latex     Magnesium Chloride Itching    Nitroglycerin     Penicillins     Tetracyclines & Related        PAST SURGICAL HISTORY     Past Surgical History:   Procedure Laterality Date    CARDIAC CATHETERIZATION  2017    HYSTERECTOMY      pci      TRIPLE LUMEN DIALYSIS CATH N/A 12/28/2021    Procedure: TRIPLE LUMEN DIALYSIS CATH;  Surgeon: Tamala Bari, MD;  Location: [facility] C[facility];  Service: Interventional Radiology;  Laterality: N/A;       REVIEW OF SYSTEMS REVIEWED:   YES  ( x )      CURRENT MEDICATION     Prior to Admission medications    Medication Sig Start Date End Date Taking? Authorizing Provider   acetaminophen (TYLENOL) 325 MG tablet Take 2 tablets (650 mg total) by mouth every 4 (four) hours as needed for Pain 08/29/18   Kolycheva, Galina N, DO   albuterol sulfate HFA  (PROVENTIL) 108 (90 Base) MCG/ACT inhaler Inhale 2 puffs into the lungs every 6 (six) hours as needed for Wheezing 01/03/22   Frederich Cha, DO   apixaban (ELIQUIS) 5 MG Take 1 tablet (5 mg total) by mouth every 12 (twelve) hours 01/09/19   Mahlon Gammon, Belva Crome, MD   budesonide-formoterol (SYMBICORT) 80-4.5 MCG/ACT inhaler Inhale 2 puffs into the lungs daily    [provider]   calcium carbonate (TUMS) 500 MG chewable tablet Chew 2 tablets (1,000 mg) by mouth every 6 (six) hours as needed for Heartburn 01/03/22   Frederich Cha, DO   diphenhydrAMINE (BENADRYL) 25 mg capsule Take 1 capsule (25 mg) by mouth every 6 (six) hours as needed for Itching 01/03/22   Frederich Cha, DO   ferrous sulfate 324 (65 FE) MG Tablet Delayed Response Take 1 tablet (324 mg) by mouth every other day 01/03/22   Frederich Cha, DO   HYDROmorphone (DILAUDID) 2 MG tablet Take 1 tablet (2  mg) by mouth every 6 (six) hours as needed (severe pain) 01/26/22 02/02/22  Anastasia PallPerez Gonzalez, Edson Snowballarlos R, MD   LORazepam (ATIVAN) 0.5 MG tablet Take 1 tablet (0.5 mg) by mouth nightly as needed for Anxiety 01/26/22   Anastasia PallPerez Gonzalez, Edson Snowballarlos R, MD   lurasidone (LATUDA) 120 MG Tab Take 1 tablet (120 mg) by mouth every evening 01/26/22   Anastasia PallPerez Gonzalez, Edson Snowballarlos R, MD   metoprolol succinate (TOPROL-XL) 25 MG 24 hr tablet Take 5 tablets (125 mg) by mouth nightly 01/26/22   Anastasia PallPerez Gonzalez, Edson Snowballarlos R, MD   midodrine (PROAMATINE) 10 MG tablet Take 1 tablet (10 mg) by mouth 2 (two) times daily with meals At 8am and 12pm 01/27/22   Anastasia PallPerez Gonzalez, Edson Snowballarlos R, MD   nortriptyline (PAMELOR) 10 MG capsule Take 1 capsule (10 mg) by mouth nightly 01/03/22   Frederich ChaNguyen, Viet, DO   ondansetron (ZOFRAN-ODT) 4 MG disintegrating tablet Take 1 tablet (4 mg) by mouth every 6 (six) hours as needed for Nausea 01/03/22   Frederich ChaNguyen, Viet, DO   pantoprazole (PROTONIX) 40 MG tablet Take 1 tablet (40 mg total) by mouth daily 08/29/18   Earl ManyKolycheva, Galina N, DO   polyethylene glycol (MIRALAX) 17 g packet Take 17 g by  mouth daily 01/27/22   Anastasia PallPerez Gonzalez, Edson Snowballarlos R, MD   QUEtiapine (SEROquel) 100 MG tablet Take 1 tablet (100 mg) by mouth nightly 01/26/22   Anastasia PallPerez Gonzalez, Edson Snowballarlos R, MD   rOPINIRole (REQUIP) 1 MG tablet Take 1 tablet (1 mg total) by mouth nightly 08/29/18   Earl ManyKolycheva, Galina N, DO   senna-docusate (PERICOLACE) 8.6-50 MG per tablet Take 2 tablets by mouth nightly 01/26/22   Anastasia PallPerez Gonzalez, Edson Snowballarlos R, MD   tamsulosin [facility](FLOMAX) 0.4 MG Cap Take 1 capsule (0.4 mg) by mouth Daily after dinner 01/26/22   Anastasia PallPerez Gonzalez, Edson Snowballarlos R, MD   thiamine (B-1) 100 MG tablet Take 1 tablet (100 mg) by mouth daily 01/04/22   Frederich ChaNguyen, Viet, DO   vitamin B-6 (B-6) 50 MG tablet Take 1 tablet (50 mg) by mouth daily 01/04/22   Frederich ChaNguyen, Viet, DO   vitamin D (vitamin D3) 25 MCG (1000 UT) tablet Take 1 tablet (25 mcg) by mouth daily 01/04/22   Frederich ChaNguyen, Viet, DO   vitamins/minerals Tab Take 1 tablet by mouth daily 01/04/22   Frederich ChaNguyen, Viet, DO   zinc sulfate (ZINCATE) 220 (50 Zn) MG capsule Take 1 capsule (220 mg) by mouth daily 01/26/22   Anastasia PallPerez Gonzalez, Edson Snowballarlos R, MD         SOCIAL HISTORY     Social History     Socioeconomic History    Marital status: Divorced     Spouse name: Not on file    Number of children: Not on file    Years of education: Not on file    Highest education level: Not on file   Occupational History    Not on file   Tobacco Use    Smoking status: Never    Smokeless tobacco: Never   Vaping Use    Vaping status: Never Used   Substance and Sexual Activity    Alcohol use: Never    Drug use: Never    Sexual activity: Not on file   Other Topics Concern    Dietary supplements / vitamins Not Asked    Anesthesia problems Not Asked    Blood thinners Not Asked    Pregnant Not Asked    Future Children Not Asked    Number of  Pregnancies? Not Asked    Number of children Not Asked    Miscarriages / Abortions? Not Asked    Eats large amounts Not Asked    Excessive Sweets Not Asked    Skips meals Not Asked    Eats excessive starches Not Asked    Snacks or  grazes Not Asked    Emotional eater Not Asked    Eats fried food Not Asked    Eats fast food Not Asked    Diet Center Not Asked    Doylene Bode Not Asked    LA Weight Loss Not Asked    Nutri-System Not Asked    Opti-Fast / Medi-Fast Not Asked    Overeaters Anonymous Not Asked    [facility] Not Asked    TOPS Not Asked    Weight Watchers Not Asked    Atkins Not Asked    Binging / Purging Not Asked    Calorie Counting Not Asked    Fasting Not Asked    High Protein Not Asked    Low Carb Not Asked    Low Fat Not Asked    [facility] Diet Not Asked    Slim Fast Not Asked    [facility] Not Asked    Stationary cycle or treadmill Not Asked    Gym/fitness Classes Not Asked    Home exercise/video Not Asked    Swimming Not Asked    Weight training Not Asked    Walking or running Not Asked    Hospitalization Not Asked    Hypnosis Not Asked    Physical therapy Not Asked    Psychological therapy Not Asked    Residential program Not Asked    Acutrim No    Byetta No    Contrave No    Dexatrim No    Diethylpropion No    Fastin No    Fen - Phen No    Ionamin / Adipex No    Phentermine No    Qsymia No    Prozac No    Saxenda No    Topamax No    Wellbutrin No    Xenical (Orlistat, Alli) No    Other Med No    No impairment Not Asked    Walks with cane/crutch Not Asked    Requires a wheelchair Not Asked    Bedridden Not Asked    Are you currently being treated for depression? Not Asked    Do you snore? Not Asked    Are you receiving any medical or psychological services? Not Asked    Do you ever wake up at night gasping for breath? Not Asked    Do you have or have you been treated for an eating disorder? Not Asked    Anyone ever told you that you stop breathing while asleep? Not Asked    Do you exercise regularly? Not Asked    Have you or family member ever have trouble with anesthesia? Not Asked   Social History Narrative    Not on file     Social Determinants of Health     Financial Resource Strain: Low Risk   (01/04/2022)    Overall Financial Resource Strain (CARDIA)     Difficulty of Paying Living Expenses: Not very hard   Food Insecurity: No Food Insecurity (07/12/2020)    Hunger Vital Sign     Worried About Running Out of Food in the Last Year: Never true     Ran Out  of Food in the Last Year: Never true   Transportation Needs: No Transportation Needs (01/26/2022)    PRAPARE - Therapist, art (Medical): No     Lack of Transportation (Non-Medical): No   Physical Activity: Insufficiently Active (07/12/2020)    Exercise Vital Sign     Days of Exercise per Week: 2 days     Minutes of Exercise per Session: 20 min   Stress: Stress Concern Present (07/12/2020)    [employer] of Occupational Health - Occupational Stress Questionnaire     Feeling of Stress : To some extent   Social Connections: Moderately Integrated (07/12/2020)    Social Connection and Isolation Panel [NHANES]     Frequency of Communication with Friends and Family: More than three times a week     Frequency of Social Gatherings with Friends and Family: Twice a week     Attends Religious Services: More than 4 times per year     Active Member of [employer] or Organizations: Yes     Attends Engineer, structural: More than 4 times per year     Marital Status: Divorced   Intimate Partner Violence: Not At Risk (07/12/2020)    Humiliation, Afraid, Rape, and Kick questionnaire     Fear of Current or Ex-Partner: No     Emotionally Abused: No     Physically Abused: No     Sexually Abused: No   Housing Stability: Low Risk  (01/04/2022)    Housing Stability Vital Sign     Unable to Pay for Housing in the Last Year: No     Number of Places Lived in the Last Year: 1     Unstable Housing in the Last Year: No         PHYSICAL EXAM     AIRWAY CLASSIFICATION:    CLASS I   (  )     CLASS II  ( x )    CLASS III  (  )     CLASS IV  (  )    CARDIAC:   RRR  LUNGS:   NLBRA  ABDOMEN:   Soft, NT  NEURO:   AAO x3  OTHER:     LABS:     Recent Labs   Lab  01/27/22  0423 01/26/22  2236 01/24/22  0628   WBC 8.41 9.63* 6.88   RBC 3.28* 3.65* 3.27*   Hgb 8.4* 9.4* 8.5*   Hematocrit 26.9* 29.3* 26.9*     Recent Labs   Lab 01/27/22  0423 01/26/22  2236 01/24/22  0628   Glucose 102* 111* 104*   BUN 5.0* 6.0* 5.0*   Creatinine 0.6 0.6 0.5   Sodium 138 137 141   CO2 23 22 24    Albumin 2.5* 2.7*  --    AST (SGOT) 15 16  --    ALT 12 11  --      Recent Labs   Lab 01/26/22  2236   PT INR 1.1   PTT 32       ASA PHYSICAL STATUS   (  )  ASA 1   HEALTHY PATIENT  (  )  ASA 2   MILD SYSTEMIC ILLNESS  (x)  ASA 3   SYSTEMIC DISEASE, NOT INCAPACITATING  (  )  ASA 4   SEVERE SYSTEMIC DISEASE, IS CONSTANT THREAT TO  LIFE  (  )  ASA 5   MORIBUND CONDITION, NOT EXPECTED TO  LIVE >24 HOURS            IRRESPECTIVE OF PROCEDURE  (  )  E           EMERGENCY PROCEDURE     PLANNED SEDATION:   (x  ) NO SEDATION  (  ) MODERATE SEDATION  (  ) DEEP SEDATION WITH ANESTHESIA    (  ) DNR Rescinded for precedure and d/w patient/family member who agree and understand.       CONCLUSION:   PATIENT HAS BEEN REASSESSED IMMEDIATELY PRIOR TO THE PROCEDURE   AND IS AN APPROPRIATE CANDIDATE FOR THE PLANNED SEDATION AND   PROCEDURE.  RISKS, BENEFITS AND ALTERNATIVES TO THE PLANNED   PROCEDURE AND SEDATION HAVE BEEN EXPLAINED TO THE PATIENT   OR GUARDIAN/FAMILY THOROUGHLY. MAJOR RISKS RE INFECTION, BLEEDING, INTERNAL ORGAN INJURY, AND RECURRENCE WERE DISCUSSED.    (x)  YES  (  )  EMERGENCY CONSENT       Verline Lema, MD    [facility]  [facility], Division of Vascular & Interventional Radiology  [phone]  [place] - [phone]  [phone]

## 2022-01-27 NOTE — Progress Notes (Signed)
Patient takes symbicort at home. She was able to show return demonstration. Inhaler was changed to self administration per respiratory PDP.

## 2022-01-27 NOTE — Progress Notes (Addendum)
Patient's IV infiltrated and tech attempted to place IV. Unable to place at this time. Notified ER nurse and stated they will send a tech to place IV. Ultrasound guided IV placed by ER tech.

## 2022-01-27 NOTE — Progress Notes (Signed)
[facility] HOSPITALIST PROGRESS NOTE    Date Time: 01/27/22 12:47 PM  Patient Name: [name],[name]  Attending Physician: Les Pou, MD        History of Presenting Illness and Interval History/24 hour Events:   HPI per Admitting Provider  " [name] is a 66 y.o. female hx afib on Eliquis, HTN, HLD, DM, COPD, TIAs, COPD, bipolar disorder, [facility] admit 2/7-2/17/23 for rhabdomyolysis (peak CK 4464 - statin d/c'ed) + vomiting ?gastroparesis + starvation ketoacidosis + UTI, [facility] admit 2/18-2/20/23 for rhabdomyolysis (peak CK 2509) + transaminitis + bilateral leg weakness + right paresthesias, admit 3/4-3/13 for bilateral hand/leg weakness and paresthesias  (unclear etiology, suspect myositis s/p IVIG but had facial paresthesias so it was stopped) + leg muscle edema on MRI (no biopsy done) + non-ischemic cardiomyopathy (EF 43%) who presents to the hospital with ongoing paraparesis. She was at [facility] rehab and her hand strength improved somewhat but she is still paraparetic. She was discharged to [facility] today but his neurologist Dr. 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 These symptoms are sudden onset, severe intensity, without alleviating factors.  "    Subjective 4/6: NAE. Patient reports no new focal complaints. Continues to show improvement with weakens, but sill reports limb stiffness.      Physical Exam:     Vitals:    01/27/22 1100   BP: 134/71   Pulse: (!) 115   Resp: 18   Temp: 99 F (37.2 C)   SpO2: 97%       Intake and Output Summary (Last 24 hours) at Date Time  No intake or output data in the 24 hours ending 01/27/22 1247    General: awake, alert, oriented x 3; no acute distress.  HEENT: NC/AT.  MMM  Neck: supple  Cardiovascular: regular rate and rhythm, no murmurs, rubs or  gallops  Lungs: clear to auscultation bilaterally, without wheezing, rhonchi, or rales  Abdomen: soft, non-tender, non-distended; normoactive bowel sounds, no rebound or guarding  Extremities: increased tone bilateral legs > arms   Neuro: cranial nerves grossly intact, strength 5/5 in upper and 3/5 lower extremities, sensation intact,   Skin: no rashes or lesions noted      Medications:     Current Facility-Administered Medications   Medication Dose Route Frequency    budesonide-formoterol  2 puff Inhalation BID    diazePAM  5 mg Oral Q8H SCH    lurasidone  120 mg Oral QHS    metoprolol succinate XL  25 mg Oral Q12H    midodrine  10 mg Oral BID Meals    pantoprazole  40 mg Oral QAM    polyethylene glycol  17 g Oral Daily    rOPINIRole  0.5 mg Oral QHS    senna-docusate  2 tablet Oral QHS    tamsulosin  0.4 mg Oral Daily    thiamine  100 mg Oral Daily    vitamin B-6  50 mg Oral Daily    vitamin D  25 mcg Oral Daily    vitamins/minerals  1 tablet Oral Daily    zinc sulfate  220 mg Oral Daily         Labs:     Results       Procedure Component Value Units Date/Time    Comprehensive  metabolic panel [[id number]]  (Abnormal) Collected: 01/27/22 0423    Specimen: Blood Updated: 01/27/22 0518     Glucose 102 mg/dL      BUN 5.0 mg/dL      Creatinine 0.6 mg/dL      Sodium 756 mEq/L      Potassium 3.2 mEq/L      Chloride 106 mEq/L      CO2 23 mEq/L      Calcium 8.7 mg/dL      Protein, Total 5.8 g/dL      Albumin 2.5 g/dL      AST (SGOT) 15 U/L      ALT 12 U/L      Alkaline Phosphatase 70 U/L      Bilirubin, Total 0.3 mg/dL      Globulin 3.3 g/dL      Albumin/Globulin Ratio 0.8     Anion Gap 9.0    GFR [[id number]] Collected: 01/27/22 0423     Updated: 01/27/22 0518     EGFR >60.0    Magnesium [[id number]] Collected: 01/27/22 0423    Specimen: Blood Updated: 01/27/22 0518     Magnesium 1.9 mg/dL     Blood Type Confirmation [[id number]] Collected: 01/27/22 0423    Specimen: Blood Updated: 01/27/22 0458     Blood Type Confirmation A  POS    CBC and differential [[id number]]  (Abnormal) Collected: 01/27/22 0423    Specimen: Blood Updated: 01/27/22 0455     WBC 8.41 x10 3/uL      Hgb 8.4 g/dL      Hematocrit 27.0 %      Platelets 371 x10 3/uL      RBC 3.28 x10 6/uL      MCV 82.0 fL      MCH 25.6 pg      MCHC 31.2 g/dL      RDW 19 %      MPV 8.8 fL      Instrument Absolute Neutrophil Count 5.00 x10 3/uL      Neutrophils 59.4 %      Lymphocytes Automated 31.6 %      Monocytes 6.2 %      Eosinophils Automated 1.8 %      Basophils Automated 0.6 %      Immature Granulocytes 0.4 %      Nucleated RBC 0.0 /100 WBC      Neutrophils Absolute 5.00 x10 3/uL      Lymphocytes Absolute Automated 2.66 x10 3/uL      Monocytes Absolute Automated 0.52 x10 3/uL      Eosinophils Absolute Automated 0.15 x10 3/uL      Basophils Absolute Automated 0.05 x10 3/uL      Immature Granulocytes Absolute 0.03 x10 3/uL      Absolute NRBC 0.00 x10 3/uL     Type and Screen [[id number]] Collected: 01/26/22 2236    Specimen: Blood Updated: 01/27/22 0005     ABO Rh A POS     AB Screen Gel NEG    Narrative:      Pre-Surgical/Pre-Procedure->No  For transfusion?->No    PT/APTT [[id number]]  (Abnormal) Collected: 01/26/22 2236     Updated: 01/26/22 2310     PT 13.2 sec      PT INR 1.1     PTT 32 sec     GFR [[id number]] Collected: 01/26/22 2236     Updated: 01/26/22 2308     EGFR >60.0    Comprehensive metabolic panel [[id number]]  (Abnormal) Collected: 01/26/22 2236  Specimen: Blood Updated: 01/26/22 2308     Glucose 111 mg/dL      BUN 6.0 mg/dL      Creatinine 0.6 mg/dL      Sodium 161 mEq/L      Potassium 3.8 mEq/L      Chloride 105 mEq/L      CO2 22 mEq/L      Calcium 9.0 mg/dL      Protein, Total 5.8 g/dL      Albumin 2.7 g/dL      AST (SGOT) 16 U/L      ALT 11 U/L      Alkaline Phosphatase 75 U/L      Bilirubin, Total 0.3 mg/dL      Globulin 3.1 g/dL      Albumin/Globulin Ratio 0.9     Anion Gap 10.0    Lactate dehydrogenase [[id number]] Collected: 01/26/22 2236    Specimen: Blood  Updated: 01/26/22 2307     LDH 212 U/L     Creatine Kinase (CK) [[id number]]  (Abnormal) Collected: 01/26/22 2236    Specimen: Blood Updated: 01/26/22 2306     Creatine Kinase (CK) 21 U/L     CBC and differential [[id number]]  (Abnormal) Collected: 01/26/22 2236    Specimen: Blood Updated: 01/26/22 2254     WBC 9.63 x10 3/uL      Hgb 9.4 g/dL      Hematocrit 57.8 %      Platelets 415 x10 3/uL      RBC 3.65 x10 6/uL      MCV 80.3 fL      MCH 25.8 pg      MCHC 32.1 g/dL      RDW 19 %      MPV 8.9 fL      Instrument Absolute Neutrophil Count 5.89 x10 3/uL      Neutrophils 61.2 %      Lymphocytes Automated 29.1 %      Monocytes 6.6 %      Eosinophils Automated 2.3 %      Basophils Automated 0.5 %      Immature Granulocytes 0.3 %      Nucleated RBC 0.0 /100 WBC      Neutrophils Absolute 5.89 x10 3/uL      Lymphocytes Absolute Automated 2.80 x10 3/uL      Monocytes Absolute Automated 0.64 x10 3/uL      Eosinophils Absolute Automated 0.22 x10 3/uL      Basophils Absolute Automated 0.05 x10 3/uL      Immature Granulocytes Absolute 0.03 x10 3/uL      Absolute NRBC 0.00 x10 3/uL                   @A1c @    Radiology:     Radiology Results (24 Hour)       ** No results found for the last 24 hours. **            Lines  Patient Lines/Drains/Airways Status       Active PICC Line / CVC Line / PIV Line / Drain / Airway / Intraosseous Line / Epidural Line / ART Line / Line / Wound / Pressure Ulcer / NG/OG Tube       Name Placement date Placement time Site Days    Urethral Catheter Non-latex;Straight-tip 16 Fr. 01/23/22  1457  Non-latex;Straight-tip  3    Wound 01/03/22 Stage 1 Coccyx;Sacrum unblanchable redness 01/03/22  2200  Coccyx;Sacrum  23  Urethral Catheter Non-latex;Straight-tip 16 Fr. (Active)   Catheter necessity reviewed? Yes 01/27/22 1100   Site Assessment WDL 01/27/22 0800   Pericare (With Urinary Catheter) Yes 01/27/22 1100   Collection Container Standard drainage bag with urimeter 01/27/22 1100    Securement Method Securement device 01/27/22 1100   Reason for Continuing Urinary Catheterization past POD 1 Acute urinary retention due to nerve injury 01/27/22 0800   Positioned catheter tubing for unobstructed urine flow: Yes 01/27/22 1100   Urine Output (mL) 300 mL 01/26/22 1500   Urine Output (mL) Total 300 mL 01/26/22 1500   Number of days: 3         Assessment:     Patient Active Problem List   Diagnosis    CVA (cerebral vascular accident)    Chest pain    Chest pain with high risk of acute coronary syndrome    Paroxysmal atrial fibrillation    Hypertension    Hyperlipidemia    Seizure disorder    History of gastrointestinal hemorrhage    Depression    Anemia    Asthma    Non-traumatic subcutaneous emphysema    Chest pain with moderate risk for cardiac etiology    Left-sided weakness    History of asthma    History of transient ischemic attack (TIA)    Type 2 diabetes mellitus    Syncope and collapse    Weakness of both legs    Hypokalemia    Thoracic back pain    Gastroparesis    Guillain Barr syndrome    Myositis    Stiff person syndrome     66 y.o. female hx afib on Eliquis, HTN, HLD, DM, COPD, TIAs, COPD, bipolar disorder, [facility] admit 2/7-2/17/23 for rhabdomyolysis (peak CK 4464 - statin d/c'ed) + vomiting ?gastroparesis + starvation ketoacidosis + UTI, [facility] admit 2/18-2/20/23 for rhabdomyolysis (peak CK 2509) + transaminitis + bilateral leg weakness + right paresthesias, admit 3/4-3/13 for bilateral hand/leg weakness and paresthesias  (unclear etiology, suspect myositis s/p IVIG but had facial paresthesias so it was stopped) + leg muscle edema on MRI (no biopsy done) + non-ischemic cardiomyopathy (EF 43%) who presents with ongoing paraparesis.     Plan:   #Neuro: Weakness, Stiffness, Ambulatory Dysfunction for 6 weeks.  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 Hand weakness appears improved but paraparesis persists.  -Neurology following  -Plan for Quinton Catheter  and PLEX x 3-4 sessions.    -Benzodiazepine trial. Start Valium 5mg  q8h.   -Routine Cancer screening as an outpatient.  -Check TPO Ab    -Quinton catheter.   -Neuro checks.   -PT/OT.      Hx afib on Eliquis, HTN, HLD, DM, COPD, TIAs, COPD, bipolar disorder, [facility] admit 2/7-2/17/23 for rhabdomyolysis (peak CK 4464 - statin d/c'ed) + vomiting ?gastroparesis + starvation ketoacidosis + UTI, [facility] admit 2/18-2/20/23 for rhabdomyolysis (peak CK 2509) + transaminitis + bilateral leg weakness + right paresthesias, admit 3/4-3/13 for bilateral hand/leg weakness and paresthesias  (unclear etiology, suspect myositis s/p IVIG but had facial paresthesias so it was stopped) + leg muscle edema on MRI (no biopsy done) + non-ischemic cardiomyopathy (EF 43%) - Hold Eliquis for Quinton but restart after Quinton.      DVT/GI prophylaxis - SCDs. Hold Eliquis for [place] but restart after Quinton.              Recent Labs     01/27/22  0423 01/26/22  2236   Potassium 3.2* 3.8  Diagnosis: Hypokalemia        Signed by: Les Pou, MD, MD, MBA  cc:Malcolm Metro, MD

## 2022-01-27 NOTE — OT Plan of Care Note (Signed)
Occupational Therapy Cancellation Note    [name]  XBW:[id number]    Unit: C163/A453.64    Patient not seen for occupational therapy evaluation secondary to pt off unit for Quinton catheter placement. OT to follow up as able/appropriate.    Adolm Joseph, OTR/L, CSRS  01/27/2022  4:55 PM  Pager [phone]

## 2022-01-27 NOTE — UM Notes (Addendum)
ADMIT TO INPATIENT (Order #[id number]) on 01/26/22    PATIENT NAME: [name],[name]   DOB: 04/04/1956     [id number] [address] [place]  History of present illness:   Pt is a 66 [facility]. 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 Pt arrived to hospital on (01/26/2022 at 2142) with ongoing paraparesis.     Arrival VS: Vitals BP: 95/49, Temp: 97.2 F (36.2 C), Temp Source: Temporal, Heart Rate: (!) 120, Resp Rate: 18, SpO2: 99 %, Height: 165.1 cm (5\' 5" ), Weight: 61.2 kg (135 lb)      Medications in ED:  Dilaudid x1 dose, NS infusion    Abnormal Labs:   01/26/22 22:36   WBC 9.63 (H)   Hemoglobin 9.4 (L)   Hematocrit 29.3 (L)   Platelet Count 415 (H)   RBC 3.65 (L)   Glucose 111 (H)   BUN 6.0 (L)   Albumin 2.7 (L)   Protein Total 5.8 (L)   Creatine Kinase (CK) 21 (L)   PT 13.2 (H)     Plan:  Paraparesis - Hand weakness appears improved but paraparesis persists. Had positive GAD65 antibody resulted today so neurology sent back for readmission for stiff person syndrome with plasmapheresis and benzodiazepine trial. Quinton catheter. Neuro checks. PT/OT. Start Valium 5mg  q8h.      Hx afib on Eliquis, HTN, HLD, DM, COPD, TIAs, COPD, bipolar disorder, [facility] admit 2/7-2/17/23 for rhabdomyolysis (peak CK 4464 - statin d/c'ed) + vomiting ?gastroparesis + starvation ketoacidosis + UTI, [facility] admit 2/18-2/20/23 for rhabdomyolysis (peak CK 2509) + transaminitis + bilateral leg weakness + right paresthesias, admit 3/4-3/13 for bilateral hand/leg weakness and paresthesias  (unclear etiology, suspect myositis s/p IVIG but had facial paresthesias so it was stopped) + leg muscle edema on MRI (no biopsy done) + non-ischemic cardiomyopathy (EF 43%) - Hold Eliquis for Quinton but restart after Quinton.      DVT/GI prophylaxis - SCDs. Hold Eliquis for [place] but restart after Quinton.     ------------------------------------------------------------------------------------   DAY 2 Continued stay  review:  CS[facility] Date[facility]    Vitals:  Temp 99  HR 120  RR 19  BP 105/65  SpO2 97% on RA    Medications:   Current Facility-Administered Medications   Medication Dose Route Frequency    budesonide-formoterol  2 puff Inhalation BID    diazePAM  5 mg Oral Q8H SCH    lurasidone  120 mg Oral QHS    metoprolol succinate XL  125 mg Oral QHS    midodrine  10 mg Oral BID Meals    pantoprazole  40 mg Oral QAM    polyethylene glycol  17 g Oral Daily    rOPINIRole  1 mg Oral QHS    senna-docusate  2 tablet Oral QHS    tamsulosin  0.4 mg Oral QHS    thiamine  100 mg Oral Daily    vitamin B-6  50 mg Oral Daily    vitamin D  25 mcg Oral Daily    vitamins/minerals  1 tablet Oral Daily    zinc sulfate  220 mg Oral Daily      sodium chloride 100 mL/hr at 01/27/22 0050     Labs:   01/27/22 04:23   Hemoglobin 8.4 (L)   Hematocrit 26.9 (L)   Platelet Count 371 (H)   RBC 3.28 (L)   BUN 5.0 (L)   Potassium 3.2 (L)  Albumin 2.5 (L)   Protein Total 5.8 (L)     Plan  #Neuro: Weakness, Stiffness, Ambulatory Dysfunction for 6 weeks.  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 Hand weakness appears improved but paraparesis persists.  -Neurology following  -Plan for Quinton Catheter and PLEX x 3-4 sessions.    -Benzodiazepine trial. Start Valium 5mg  q8h.   -Routine Cancer screening as an outpatient.  -Check TPO Ab    -Quinton catheter.   -Neuro checks.   -PT/OT.      Hx afib on Eliquis, HTN, HLD, DM, COPD, TIAs, COPD, bipolar disorder, [facility] admit 2/7-2/17/23 for rhabdomyolysis (peak CK 4464 - statin d/c'ed) + vomiting ?gastroparesis + starvation ketoacidosis + UTI, [facility] admit 2/18-2/20/23 for rhabdomyolysis (peak CK 2509) + transaminitis + bilateral leg weakness + right paresthesias, admit 3/4-3/13 for bilateral hand/leg weakness and paresthesias  (unclear etiology, suspect myositis s/p IVIG but had facial paresthesias so it was stopped) + leg muscle edema on MRI (no  biopsy done) + non-ischemic cardiomyopathy (EF 43%) - Hold Eliquis for Quinton but restart after Quinton.      DVT/GI prophylaxis - SCDs. Hold Eliquis for [place] but restart after Quinton.     UTILIZATION REVIEW CONTACT: [place], BSN, RN  Utilization Review   [facility]  [address]  [address]  [place], [place]  Phone: [phone]  Main: [phone]   Fax: [phone]  Email: [email or web address]  NPI:   [id number]  Tax ID:  [id number]         NOTES TO REVIEWER:    This clinical review is based on/compiled from documentation provided by the treatment team within the patient's medical record.

## 2022-01-27 NOTE — Plan of Care (Signed)
Problem: Moderate/High Fall Risk Score >5  Goal: Patient will remain free of falls  01/27/2022 1448 by Sumiko Ceasar, Aldean JewettMadeline Grace, RN  Outcome: Progressing  Flowsheets (Taken 01/27/2022 1445)  High (Greater than 13):   HIGH-Visual cue at entrance to patient's room   HIGH-Bed alarm on at all times while patient in bed   HIGH-Utilize chair pad alarm for patient while in the chair   HIGH-Apply yellow "Fall Risk" arm band   HIGH-Initiate use of floor mats as appropriate   HIGH-Consider use of low bed  01/27/2022 1425 by Olander Friedl, Aldean JewettMadeline Grace, RN  Outcome: Progressing  Flowsheets (Taken 01/27/2022 1000)  High (Greater than 13):   HIGH-Visual cue at entrance to patient's room   HIGH-Bed alarm on at all times while patient in bed   HIGH-Utilize chair pad alarm for patient while in the chair   HIGH-Apply yellow "Fall Risk" arm band   HIGH-Consider use of low bed   HIGH-Initiate use of floor mats as appropriate     Problem: Neurological Deficit  Goal: Neurological status is stable or improving  Outcome: Progressing  Flowsheets (Taken 01/27/2022 1448)  Neurological status is stable or improving:   Monitor/assess/document neurological assessment (Stroke: every 4 hours)   Perform CAM Assessment     Problem: Peripheral Neurovascular Impairment  Goal: Extremity color, movement, sensation are maintained or improved  Outcome: Progressing  Flowsheets (Taken 01/27/2022 1448)  Extremity color, movement, sensation are maintained or improved:   Increase mobility as tolerated/progressive mobility   Assess and monitor application of corrective devices (cast, brace, splint), check skin integrity   Assess extremity for proper alignment     Problem: Impaired Mobility  Goal: Mobility/Activity is maintained at optimal level for patient  Outcome: Progressing  Flowsheets (Taken 01/27/2022 1448)  Mobility/activity is maintained at optimal level for patient:   Increase mobility as tolerated/progressive mobility   Encourage independent activity per ability    Maintain proper body alignment   Perform active/passive ROM   Plan activities to conserve energy, plan rest periods   Reposition patient every 2 hours and as needed unless able to reposition self   Consult/collaborate with Physical Therapy and/or Occupational Therapy     Problem: Anxiety  Goal: Anxiety is at a manageable level  Outcome: Progressing  Flowsheets (Taken 01/27/2022 1448)  Anxiety is at a manageable level:   Orient to unit   Inform/explain to patient/patient care companion all tests/procedures/treatment/care prior to initiation   Facilitate expression of feelings, fears, concerns, anxiety   Assess emotional status and coping mechanisms   Provide alternatives to reduce anxiety   Provide and maintain a safe environment   Limit or eliminate stimulants such as caffeine and nicotine   Include patient/patient care companion in decisions related to anxiety/depression   Provide emotional support   Encourage participation in care       NURSING PROGRESS NOTE: STROKE UNIT    Patient Name: [name] (16(65 y.o. female)  Admission Date: 01/26/2022 [facility](Hospital Day 1)      Recent Labs   Lab 01/27/22  0423   Sodium 138   Potassium 3.2*   Chloride 106   CO2 23   BUN 5.0*   Creatinine 0.6   EGFR >60.0   Glucose 102*   Calcium 8.7       Recent Labs   Lab 01/27/22  0423   WBC 8.41   Hgb 8.4*   Hematocrit 26.9*   Platelets 371*         Patient Lines/Drains/Airways  Status       Active Lines, Drains and Airways       Name Placement date Placement time Site Days    Urethral Catheter Non-latex;Straight-tip 16 Fr. 01/23/22  1457  Non-latex;Straight-tip  3                       Braden Scale Score: 16 (01/27/22 1420)      Skin Integrity: Scars  Bruising Skin Location: scattered    Wound 01/03/22 Stage 1 Coccyx;Sacrum unblanchable redness (Active)   Date First Assessed/Time First Assessed: 01/03/22 2200   Pressure Injury Staging (WOCN/ Trained RNs Only): Stage 1  Location: Coccyx;Sacrum  Wound Description (Comments): unblanchable redness   Present on Admission: Yes      Assessments 01/03/2022 11:00 PM 01/27/2022  2:20 PM   Site Description Clean;Dry;Intact Dry;Clean   Peri-wound Description Clean;Dry Dry;Clean;Intact   Closure Open to air Open to air   Treatments Site care --   Dressing Foam Open to air   Dressing Changed New --   Dressing Status Clean;Dry;Intact --       No associated orders.       Safety Checklist   1:1 Sitter N    Avasys N       Interpreter Services:  Does the patient require an Interpreter? N    If yes, what form of interpreter services was used? N/a    If family was utilized, is interpreter waiver form signed and in the chart? N/A      ASSESSMENT/PLAN:    Last BM: PTA    Pending Orders: PLEX, Diazepam challenge study, voiding trial, midline placement    Discharge Plan: SNF    POC / Family Update: Pt    Shift Note: Pt A&Ox4, speech is slurred, FC, calm and cooperative. MAE. Numbness and tingling in Bilateral fingers and toes, decreased sensation on L side. BUE can overcome resistance. BLE can overcome gravity. OOB x1-2 with walker.  Pt c/o generalized pain with prn Dilaudid and tylenol given throughout shift. Scheduled Diazepam given.   Foley removed @1600 . External cath in place. Continent to bowel. RA. Regular diet. Meds whole.   Quinton cath placed today.     PPE worn in patient's room: Mask, gloves

## 2022-01-27 NOTE — Consults (Signed)
[facility] Consultation Note                                       Date Time: 01/27/22 9:16 AM  Patient Name: [name]  Requesting Physician: Les Pou, MD  Date of Admission: 01/26/2022    CC / Reason for Consultation: LE weakness, paresthesias           Assessment:   65 yo F with PMHx of afib (on eliquis), HTN, HLD, DM, COPD, TIA, bipolar d/o, recent rhabdomyolysis (02/23), recent [facility] admission 3/13-01/26/22 for weakness and discharged to acute rehab 01/26/22. Pt called back to [facility] on recommendation of neurologist Dr. 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    Weakness, stiffness, difficulty ambulating, weight loss - Symptoms x 4-6 weeks. 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  -Workup on recent admission 3/13-4/5:  -CSF protein 52, WBC 1. ME panel, cx, HSV negative  -ENS2: +GAD65 Ab  -Ganglioside Ab panel neg  -Motor sensory neuropathy panel neg  -MG panel neg  -RPR, HIV nonreactive  -TSH, B12, copper,  wnl              -Anaphylactic response to IVIg (IgA 435)              -3/6 EMG showing a myopathic process affecting proximal BLE              -CT chest/abd/pelvis unremarkable              -CK 239, Vit D 10. LDH 450  -MRI femur bilat WWO 3/9: Mild edema and atrophy involving the semimembranosus and biceps femoris muscles of the right and left thighs, noted to be bilateral and symmetric in appearance.      -Workup this admission:  -CK 21    Neurology Attending Addendum:    I have personally reviewed the interval history, images and pertinent test results and I have personally examined the patient and confirmed the major physical findings of this resident/NP/PA note.  Also, I have noted any changes since the note was written as well as added additional findings and recommendations.   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 [name], MD  Neuro-hospitalist  [facility]        Plan:   -Medical management per primary care team   -Replacement of vit B1, vit B6, zinc per primary care team  -Trial of PLEX 3 sessions s/p placement of quinton. PLEX planned for Fri / Mon / Wed.  -Valium challenge to see if leg improvement, dosing per Dr. Joyce Gross  -PT/OT  -Plan is per Dr. Joyce Gross        HPI   [name] is a 66 y.o. female with PMHx of afib (on eliquis), HTN, HLD, DM, COPD, TIA, bipolar d/o, recent rhabdomyolysis (02/23), recent [facility] admission 3/13-01/26/22 for weakness and discharged to acute rehab 01/26/22. Pt called back to [facility] on recommendation of neurologist Dr. 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 Pt endorses ongoing weakness to BUE, BLE, stiffness, pain to hands / feet / legs, does endorses some paresthesias and lightheadedness. She endorses 50lbs weight loss over the past month or so. Pt denies dysphagia, vision changes, dyspnea.     Hx is per pt, EMR.      Past Medical Hx     Past Medical History:   Diagnosis Date    Anemia     Asthma      Atrial fibrillation     Chronic obstructive pulmonary disease     Convulsions     Depression     Diabetes mellitus     diet controlled     Hyperlipidemia     Hypertension     TIA (transient ischemic attack)           Past Surgical Hx:     Past Surgical History:   Procedure Laterality Date    CARDIAC CATHETERIZATION  2017    HYSTERECTOMY      pci      TRIPLE LUMEN DIALYSIS CATH N/A 12/28/2021    Procedure: TRIPLE LUMEN DIALYSIS CATH;  Surgeon: Tamala Bari, MD;  Location: [facility] CARDIAC CATH;  Service: Interventional Radiology;  Laterality: N/A;        Family Medical History:      Family History   Problem Relation Age of Onset    Stroke Maternal Grandmother        Social Hx     Social History     Socioeconomic History    Marital status: Divorced   Tobacco Use    Smoking status: Never    Smokeless tobacco: Never   Vaping Use    Vaping status: Never Used   Substance and Sexual Activity    Alcohol use: Never    Drug use: Never   Other Topics Concern    Acutrim No    Byetta No    Contrave No    Dexatrim No    Diethylpropion No    Fastin No    Fen - Phen No    Ionamin / Adipex No    Phentermine No    Qsymia No    Prozac No    Saxenda No    Topamax No    Wellbutrin No    Xenical (Orlistat, Alli) No    Other Med No     Social Determinants of Health     Financial Resource Strain: Low Risk  (01/04/2022)    Overall Financial Resource Strain (CARDIA)     Difficulty of Paying Living Expenses: Not very hard  Food Insecurity: No Food Insecurity (07/12/2020)    Hunger Vital Sign     Worried About Running Out of Food in the Last Year: Never true     Ran Out of Food in the Last Year: Never true   Transportation Needs: No Transportation Needs (01/26/2022)    PRAPARE - Therapist, art (Medical): No     Lack of Transportation (Non-Medical): No   Physical Activity: Insufficiently Active (07/12/2020)    Exercise Vital Sign     Days of Exercise per Week: 2 days     Minutes of Exercise per Session: 20 min   Stress: Stress  Concern Present (07/12/2020)    [employer] of Occupational Health - Occupational Stress Questionnaire     Feeling of Stress : To some extent   Social Connections: Moderately Integrated (07/12/2020)    Social Connection and Isolation Panel [NHANES]     Frequency of Communication with Friends and Family: More than three times a week     Frequency of Social Gatherings with Friends and Family: Twice a week     Attends Religious Services: More than 4 times per year     Active Member of [employer] or Organizations: Yes     Attends Engineer, structural: More than 4 times per year     Marital Status: Divorced   Intimate Partner Violence: Not At Risk (07/12/2020)    Humiliation, Afraid, Rape, and Kick questionnaire     Fear of Current or Ex-Partner: No     Emotionally Abused: No     Physically Abused: No     Sexually Abused: No   Housing Stability: Low Risk  (01/04/2022)    Housing Stability Vital Sign     Unable to Pay for Housing in the Last Year: No     Number of Places Lived in the Last Year: 1     Unstable Housing in the Last Year: No       Meds     Home :   Prior to Admission medications    Medication Sig Start Date End Date Taking? Authorizing Provider   acetaminophen (TYLENOL) 325 MG tablet Take 2 tablets (650 mg total) by mouth every 4 (four) hours as needed for Pain 08/29/18   Kolycheva, Galina N, DO   albuterol sulfate HFA (PROVENTIL) 108 (90 Base) MCG/ACT inhaler Inhale 2 puffs into the lungs every 6 (six) hours as needed for Wheezing 01/03/22   Frederich Cha, DO   apixaban (ELIQUIS) 5 MG Take 1 tablet (5 mg total) by mouth every 12 (twelve) hours 01/09/19   Mahlon Gammon, Belva Crome, MD   budesonide-formoterol (SYMBICORT) 80-4.5 MCG/ACT inhaler Inhale 2 puffs into the lungs daily    [provider]   calcium carbonate (TUMS) 500 MG chewable tablet Chew 2 tablets (1,000 mg) by mouth every 6 (six) hours as needed for Heartburn 01/03/22   Frederich Cha, DO   diphenhydrAMINE (BENADRYL) 25 mg capsule Take 1  capsule (25 mg) by mouth every 6 (six) hours as needed for Itching 01/03/22   Frederich Cha, DO   ferrous sulfate 324 (65 FE) MG Tablet Delayed Response Take 1 tablet (324 mg) by mouth every other day 01/03/22   Frederich Cha, DO   HYDROmorphone (DILAUDID) 2 MG tablet Take 1 tablet (2 mg) by mouth every 6 (six) hours as needed (severe pain) 01/26/22 02/02/22  Anastasia Pall, Edson Snowball, MD   LORazepam (ATIVAN) 0.5 MG tablet Take 1 tablet (0.5  mg) by mouth nightly as needed for Anxiety 01/26/22   Anastasia Pall, Edson Snowball, MD   lurasidone (LATUDA) 120 MG Tab Take 1 tablet (120 mg) by mouth every evening 01/26/22   Anastasia Pall, Edson Snowball, MD   metoprolol succinate (TOPROL-XL) 25 MG 24 hr tablet Take 5 tablets (125 mg) by mouth nightly 01/26/22   Anastasia Pall, Edson Snowball, MD   midodrine (PROAMATINE) 10 MG tablet Take 1 tablet (10 mg) by mouth 2 (two) times daily with meals At 8am and 12pm 01/27/22   Anastasia Pall, Edson Snowball, MD   nortriptyline (PAMELOR) 10 MG capsule Take 1 capsule (10 mg) by mouth nightly 01/03/22   Frederich Cha, DO   ondansetron (ZOFRAN-ODT) 4 MG disintegrating tablet Take 1 tablet (4 mg) by mouth every 6 (six) hours as needed for Nausea 01/03/22   Frederich Cha, DO   pantoprazole (PROTONIX) 40 MG tablet Take 1 tablet (40 mg total) by mouth daily 08/29/18   Earl Many, DO   polyethylene glycol (MIRALAX) 17 g packet Take 17 g by mouth daily 01/27/22   Anastasia Pall, Edson Snowball, MD   QUEtiapine (SEROquel) 100 MG tablet Take 1 tablet (100 mg) by mouth nightly 01/26/22   Anastasia Pall, Edson Snowball, MD   rOPINIRole (REQUIP) 1 MG tablet Take 1 tablet (1 mg total) by mouth nightly 08/29/18   Earl Many, DO   senna-docusate (PERICOLACE) 8.6-50 MG per tablet Take 2 tablets by mouth nightly 01/26/22   Anastasia Pall, Edson Snowball, MD   tamsulosin [facility]) 0.4 MG Cap Take 1 capsule (0.4 mg) by mouth Daily after dinner 01/26/22   Anastasia Pall, Edson Snowball, MD   thiamine (B-1) 100 MG tablet Take 1 tablet (100 mg) by mouth daily  01/04/22   Frederich Cha, DO   vitamin B-6 (B-6) 50 MG tablet Take 1 tablet (50 mg) by mouth daily 01/04/22   Frederich Cha, DO   vitamin D (vitamin D3) 25 MCG (1000 UT) tablet Take 1 tablet (25 mcg) by mouth daily 01/04/22   Frederich Cha, DO   vitamins/minerals Tab Take 1 tablet by mouth daily 01/04/22   Frederich Cha, DO   zinc sulfate (ZINCATE) 220 (50 Zn) MG capsule Take 1 capsule (220 mg) by mouth daily 01/26/22   Anastasia Pall, Edson Snowball, MD      Inpatient :   Current Facility-Administered Medications   Medication Dose Route Frequency    budesonide-formoterol  2 puff Inhalation BID    diazePAM  5 mg Oral Q8H SCH    lurasidone  120 mg Oral QHS    metoprolol succinate XL  25 mg Oral Q12H    midodrine  10 mg Oral BID Meals    pantoprazole  40 mg Oral QAM    polyethylene glycol  17 g Oral Daily    rOPINIRole  1 mg Oral QHS    senna-docusate  2 tablet Oral QHS    tamsulosin  0.4 mg Oral Daily    thiamine  100 mg Oral Daily    vitamin B-6  50 mg Oral Daily    vitamin D  25 mcg Oral Daily    vitamins/minerals  1 tablet Oral Daily    zinc sulfate  220 mg Oral Daily         Allergies    Singulair [montelukast], Immune globulin (human), Myoview [technetium-14m], Atorvastatin, Contrast [iodinated contrast media], Latex, Magnesium chloride, Nitroglycerin, Penicillins, and Tetracyclines & related      Review of Systems  Pertinent items are noted in HPI.  All other systems were reviewed and are negative except for that mentioned in the HPI    Physical Exam:   Temp:  [97.2 F (36.2 C)-99 F (37.2 C)] 99 F (37.2 C)  Heart Rate:  [107-120] 120  Resp Rate:  [17-21] 17  BP: (94-126)/(49-79) 114/75     Vital Signs:  Reviewed    General: Well developed and well nourished. No acute distress. Cooperative with the exam  ENT: Normal oral mucosa, no ear or nose discharge  Neck: Symmetric, no deformities  CV: mild tachy  Resp: No audible wheezing, normal work of breathing  Abd: Nondistended  Skin: Intact, extremities normal in color  Psych:  Affect is normal, good insight    Mental Status: The patient is awake, alert and oriented to person, place, and time.    Fund of knowledge appropriate  Recent and remote memory are intact   Attention span and concentration appear normal.  Language function is normal. There is no evidence of aphasia in conversational speech.    Cranial nerves:   -CN II: Visual fields full to bedside confrontation   -CN III, IV, VI: Pupils equal, round, and reactive to light; extraocular movements intact; no ptosis              -CN V: Facial sensation intact in V1 through V3 distributions   -CN VII: Face symmetric   -CN VIII: Hearing intact to conversational speech   -CN IX, X: Palate elevates symmetrically; normal phonation   -CN XI: Symmetric full strength of trapezius muscles   -CN XII: Tongue protrudes midline    Motor: Muscle tone increased in legs > arms. No atrophy.  No pronator drift.     UEs:   Deltoid Bicep Tricep Grip   Right 5 5 5 5    Left 5 5 5 5      LEs   HF KF KE PF DF   Right 2 4 5 5 5    Left 2 4 4+ 5 5     Sensory:   Light touch intact.  Temperature diminished in RLE only, normal in LLE, BUE      Reflexes:      B T BR P A   Right 0 0 0 0 0   Left 0 0 0 0 0     Plantars: flexor bilat    Coordination: fine tremors intermittently RUE, LLE    Gait: ambulates from chair to bed with 1-person assist using walker        Labs:     Results       Procedure Component Value Units Date/Time    Comprehensive metabolic panel [[id number]]  (Abnormal) Collected: 01/27/22 0423    Specimen: Blood Updated: 01/27/22 0518     Glucose 102 mg/dL      BUN 5.0 mg/dL      Creatinine 0.6 mg/dL      Sodium 332 mEq/L      Potassium 3.2 mEq/L      Chloride 106 mEq/L      CO2 23 mEq/L      Calcium 8.7 mg/dL      Protein, Total 5.8 g/dL      Albumin 2.5 g/dL      AST (SGOT) 15 U/L      ALT 12 U/L      Alkaline Phosphatase 70 U/L      Bilirubin, Total 0.3 mg/dL      Globulin 3.3 g/dL  Albumin/Globulin Ratio 0.8     Anion Gap 9.0    GFR [[id number]]  Collected: 01/27/22 0423     Updated: 01/27/22 0518     EGFR >60.0    Magnesium [[id number]] Collected: 01/27/22 0423    Specimen: Blood Updated: 01/27/22 0518     Magnesium 1.9 mg/dL     Blood Type Confirmation [[id number]] Collected: 01/27/22 0423    Specimen: Blood Updated: 01/27/22 0458     Blood Type Confirmation A POS    CBC and differential [[id number]]  (Abnormal) Collected: 01/27/22 0423    Specimen: Blood Updated: 01/27/22 0455     WBC 8.41 x10 3/uL      Hgb 8.4 g/dL      Hematocrit 62.9 %      Platelets 371 x10 3/uL      RBC 3.28 x10 6/uL      MCV 82.0 fL      MCH 25.6 pg      MCHC 31.2 g/dL      RDW 19 %      MPV 8.8 fL      Instrument Absolute Neutrophil Count 5.00 x10 3/uL      Neutrophils 59.4 %      Lymphocytes Automated 31.6 %      Monocytes 6.2 %      Eosinophils Automated 1.8 %      Basophils Automated 0.6 %      Immature Granulocytes 0.4 %      Nucleated RBC 0.0 /100 WBC      Neutrophils Absolute 5.00 x10 3/uL      Lymphocytes Absolute Automated 2.66 x10 3/uL      Monocytes Absolute Automated 0.52 x10 3/uL      Eosinophils Absolute Automated 0.15 x10 3/uL      Basophils Absolute Automated 0.05 x10 3/uL      Immature Granulocytes Absolute 0.03 x10 3/uL      Absolute NRBC 0.00 x10 3/uL     Type and Screen [[id number]] Collected: 01/26/22 2236    Specimen: Blood Updated: 01/27/22 0005     ABO Rh A POS     AB Screen Gel NEG    Narrative:      Pre-Surgical/Pre-Procedure->No  For transfusion?->No    PT/APTT [[id number]]  (Abnormal) Collected: 01/26/22 2236     Updated: 01/26/22 2310     PT 13.2 sec      PT INR 1.1     PTT 32 sec     GFR [[id number]] Collected: 01/26/22 2236     Updated: 01/26/22 2308     EGFR >60.0    Comprehensive metabolic panel [[id number]]  (Abnormal) Collected: 01/26/22 2236    Specimen: Blood Updated: 01/26/22 2308     Glucose 111 mg/dL      BUN 6.0 mg/dL      Creatinine 0.6 mg/dL      Sodium 433 mEq/L      Potassium 3.8 mEq/L      Chloride 105 mEq/L      CO2 22 mEq/L      Calcium 9.0  mg/dL      Protein, Total 5.8 g/dL      Albumin 2.7 g/dL      AST (SGOT) 16 U/L      ALT 11 U/L      Alkaline Phosphatase 75 U/L      Bilirubin, Total 0.3 mg/dL      Globulin 3.1 g/dL      Albumin/Globulin Ratio 0.9     Anion  Gap 10.0    Lactate dehydrogenase [[id number]] Collected: 01/26/22 2236    Specimen: Blood Updated: 01/26/22 2307     LDH 212 U/L     Creatine Kinase (CK) [[id number]]  (Abnormal) Collected: 01/26/22 2236    Specimen: Blood Updated: 01/26/22 2306     Creatine Kinase (CK) 21 U/L     CBC and differential [[id number]]  (Abnormal) Collected: 01/26/22 2236    Specimen: Blood Updated: 01/26/22 2254     WBC 9.63 x10 3/uL      Hgb 9.4 g/dL      Hematocrit 88.4 %      Platelets 415 x10 3/uL      RBC 3.65 x10 6/uL      MCV 80.3 fL      MCH 25.8 pg      MCHC 32.1 g/dL      RDW 19 %      MPV 8.9 fL      Instrument Absolute Neutrophil Count 5.89 x10 3/uL      Neutrophils 61.2 %      Lymphocytes Automated 29.1 %      Monocytes 6.6 %      Eosinophils Automated 2.3 %      Basophils Automated 0.5 %      Immature Granulocytes 0.3 %      Nucleated RBC 0.0 /100 WBC      Neutrophils Absolute 5.89 x10 3/uL      Lymphocytes Absolute Automated 2.80 x10 3/uL      Monocytes Absolute Automated 0.64 x10 3/uL      Eosinophils Absolute Automated 0.22 x10 3/uL      Basophils Absolute Automated 0.05 x10 3/uL      Immature Granulocytes Absolute 0.03 x10 3/uL      Absolute NRBC 0.00 x10 3/uL             Rads:     Results for orders placed or performed during the hospital encounter of 12/24/21   MRI Brain WO Contrast    Narrative    Clinical History:    lower extremity weakness    Technique:    MRI BRAIN WO CONTRAST MRI of the brain was performed without contrast as  per departmental protocol. Planar reformatted images were submitted for  review.    Comparison:    MRI of the brain dated 06/16/2019    Findings:    The gyri and sulci are within normal limits. There is no intra or  extra-axial fluid collection, midline shift or mass  effect. There is a  cable septum pellucidum and cavum verge. There is no hydrocephalus.  There are patchy and punctate areas of T2/FLAIR hyperintensity within  the periventricular and subcortical white matter bilaterally, not  significantly changed from the prior study. There are no areas of  restricted diffusion. The basal cisterns are patent.    The regions of the sella, pineal gland, and craniocervical junction are  unremarkable. Flow-voids are identified within the vessels of the skull  base. The globes and orbits are unremarkable. The paranasal sinuses and  mastoid air cells are clear. The scalp and calvarium are unremarkable.      Impression    No acute intracranial abnormality.    Mild chronic microvascular ischemic changes.    Cavum septum pellucidum and cavum vergae.    Alric Seton MD, MD   12/25/2021 2:47 AM   CT Head WO Contrast    Narrative    CT HEAD WO CONTRAST    CLINICAL  INDICATION:   weakness in lower extremities, drooling, change  in speech    COMPARISON: 06/23/2019    TECHNIQUE: 5 mm axial images from the skull base to the vertex. The  following  dose reduction techniques were utilized: automated exposure  control and/or adjustment of the mA and/or kV according to patient size,  and the use of iterative reconstruction technique.    FINDINGS:     The ventricles, cisterns, and sulci appear within normal size limits.  There is a cavum vergae again noted. There is no mass effect or midline  shift. There is no hemorrhage or abnormal extra-axial fluid collection.  The gray-white differentiation appears maintained. Ill-defined white  matter hypodensities are nonspecific but likely represent microvascular  ischemic changes.  Bone windows demonstrate no evidence for acute  osseous abnormality. The included paranasal sinuses and mastoid air  cells appear clear.      Impression     No acute intracranial process.    Sandie Ano, MD   12/24/2021 8:17 PM   Results for orders placed or performed during the  hospital encounter of 06/16/19   MRI Brain W WO Contrast    Narrative    INDICATION: Neuro deficit, acute, stroke suspected. Left-sided weakness.  Left-sided facial droop.    TECHNIQUE: Multiplanar, multisequence MR imaging of the brain was  performed before and after 10 mL Gadavist IV contrast administration.     COMPARISON: 05/20/2019.    FINDINGS: Exam is limited by artifact related to patient motion.    BRAIN: Cavum septum pellucidum is noted. Scattered foci of T2  prolongation demonstrated within the periventricular, deep, and  subcortical white matter. The ventricles and sulci are normal in size  and configuration. There is no acute infarct or intracerebral  hemorrhage. No extra-axial blood or fluid collection is present. No  intracranial mass is identified. There is no midline shift or edema. No  abnormal enhancement appreciated within the limitations of the exam. No  paranasal sinus air fluid levels demonstrated.        Impression     Motion limited exam. No evidence of acute intracranial  abnormality.      Moderate diffuse white matter disease, nonspecific but most often  reflects chronic small vessel ischemic changes.    Gustavus Messing, MD   06/16/2019 6:52 PM   MR Angiogram Head WO Contrast    Narrative    INDICATION: Neuro deficit, acute, stroke suspected    TECHNIQUE: Time of flight MRA of the circle of Willis was performed      FINDINGS:    MRA:    Right ACA: No evidence of high grade stenosis or occlusion.   Right MCA: No evidence of high grade stenosis or occlusion.   Right PCA: No evidence of high grade stenosis or occlusion.     Left ACA: No evidence of high grade stenosis or occlusion.   Left MCA: No evidence of high grade stenosis or occlusion.   Left PCA: No evidence of high grade stenosis or occlusion.    No aneurysm identified.      Impression     No evidence of significant stenosis within the circle of  Willis.      END OF IMPRESSION       Gustavus Messing, MD   06/16/2019 6:48 PM       Sarita Haver, PA  PA-C   [facility]  [facility] Consult Spectra: [phone]  [facility] Consult Spectra: [phone]  Between [phone], please epic chat: "  [facility] Neu[facility]"  After [phone], call: [phone]  12:35 PM  01/27/22    Please see attending Neurologist note that accompanies this mid-level encounter note.

## 2022-01-27 NOTE — Sedation Documentation (Signed)
Patient arrived to Cath Lab small procedure room  from [place] . Threshold Pause performed. Moved from stretcher to the table with 2 person assist. Patient placed on monitor and oxygen . Padded arm board in place. Bair Hugger in place. Body in proper alignment. Patient being prepped in a sterile fashion by technologist Thitikun.

## 2022-01-27 NOTE — Brief Op Note (Addendum)
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    Anesthesia:   (  ) Conscious Sedation and Local Anesthesia  (  ) Fentanyl and Local Anesthesia  ( x ) Local Anesthesia  (  ) Provided by Anesthesia Department     A safety timeout was performed.   Estimated Blood Loss:   Less than 15 mL.    Implants:     Implant Name Type Inv. Item Serial No. Manufacturer Lot No. LRB No. Used Action   SET CATH PU STRG DUO-SPLT 13FR 15CM BSC - [id number] Catheter SET CATH PU STRG DUO-SPLT 13FR 15CM BSC  MEDCOMP [id number] Right 1 Implanted       Findings:   CONTRAST:  Contrast agent: None  Contrast volume (mL): 0    RADIATION PARAMETERS:  Fluoroscopy time (mm:ss): 00:30.1    Reference air kerma (mGy): 4.154     IMPRESSION:  Right internal jugular approach non-tunneled (Quinton) central venous catheter placement.    PLAN:  Catheter is ready for use.    Complications:   None.                                                                    Verline Lema, MD    [facility]  [facility], Division of Vascular & Interventional Radiology  [phone]  [place] - [phone]  IFH--[id number] [phone] IFOH--[id number]    [facility] CARDIAC CATH

## 2022-01-27 NOTE — Progress Notes (Signed)
S:  [name] is a 66 y.o. female hx afib on Eliquis, HTN, HLD, DM, COPD, TIAs, COPD, bipolar disorder, [facility] admit 2/7-2/17/23 for rhabdomyolysis (peak CK 4464 - statin d/c'ed) + vomiting ?gastroparesis + starvation ketoacidosis + UTI, [facility] admit 2/18-2/20/23 for rhabdomyolysis (peak CK 2509) + transaminitis + bilateral leg weakness + right paresthesias, admit 3/4-3/13 for bilateral hand/leg weakness and paresthesias  (unclear etiology, suspect myositis s/p IVIG but had facial paresthesias so it was stopped) + leg muscle edema on MRI (no biopsy done) + non-ischemic cardiomyopathy (EF 43%) who presents to the hospital with ongoing paraparesis. She was at [facility] rehab and her hand strength improved somewhat but she is still paraparetic. She was discharged to [facility] today but his neurologist Dr. 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 These symptoms are sudden onset, severe intensity, without alleviating factors.    Ongoing Paraparesis     B:  Pt discharged to [facility] on 01/03/22 from [facility].   Yesterday pt was transferred from [facility] to [facility].   Pt will likely go back to [place] when medically stable.   A P2P did have to be completed for insurance auth before she went to [facility] yesterday.      A:  Still being worked up.  Will likely need placement, came from [place], hopefully wants to return to [facility].     R:  Case Management will continue to follow and communicate with treatment team regarding discharge planning.        Beverlee Wilmarth von Meredith Mody  MSW, [employer], ACM-SW  Discharge Planner   [email or web address]

## 2022-01-27 NOTE — PT Eval Note (Signed)
Physical Therapy Evaluation  [name]      Post Acute Care Therapy Recommendations:     Discharge Recommendations:  SNF      If SNF  recommended discharge disposition is not available, patient will need mod to max assist for all mobility and HHPT.  Will need transport into home and 1 floor set up.     DME needs IF patient is discharging home: Front wheel walker, Wheelchair-manual, [facility]    Therapy discharge recommendations may change with patient status.  Please refer to most recent note for up-to-date recommendations.    Assessment:   Significant Findings: none    [name] is a 66 y.o. female admitted 01/26/2022.  Patient presents A and O x 3 and follows all commands as able.  C/o altered sensation/sensitivity B fingers and calf to foot.  Poor coordination L UE>R and B LE RAM.  She demonstrates functional BUE strength, and distal LE strength but profound hip/pelvic weakness.  Pt able to mobilize to edge of bed using UEs to assist LEs for movement.  Pt able to ambulate in room with walker, min to mod A, and significant gait deviations and balance deficit.  Would benefit from continued skilled PT.  Recommend SNF rehab at d/c.  Will continue to assess for appropriateness of acute rehab depending on treatment response.      Therapy Diagnosis: impaired mobility, imbalance    Rehabilitation Potential: good    Treatment Activities: functional mobility training, gait training, therapeutic exercise  Educated the patient to role of physical therapy, plan of care, goals of therapy and HEP, safety with mobility and ADLs.    Plan:   PT Frequency: 4-5x/wk    Treatment/Interventions: functional mobility training, gait training, neuromuscular re ed, balance training, therapeutic exercise    Risks/benefits/POC discussed with patient      Unit: [facility] TOWER 6 EAST  Bed: F613/F613.01     Precautions and Contraindications:   Fall  Paraparesis    Consult received for [name] for PT Evaluation and Treatment.   Patient's medical condition is appropriate for Physical Therapy intervention at this time.    History of Present Illness:   [name] is a 66 y.o. 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  Extensive medical h/o with recent multiple admissions Feb 2023, March 2023.  D/c to acute rehab then to SNF just prior to admission.      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]    Past Medical/Surgical History:  Past Medical History:   Diagnosis Date    Anemia     Asthma     Atrial fibrillation     Chronic obstructive pulmonary disease     Convulsions     Depression     Diabetes mellitus     diet controlled     Hyperlipidemia     Hypertension     TIA (transient ischemic attack)      Past Surgical History:   Procedure Laterality Date    CARDIAC CATHETERIZATION  2017    HYSTERECTOMY      pci      TRIPLE LUMEN DIALYSIS CATH N/A 12/28/2021    Procedure: TRIPLE LUMEN DIALYSIS CATH;  Surgeon: Tamala Bari, MD;  Location: [facility] CARDIAC CATH;  Service: Interventional Radiology;  Laterality: N/A;       Imaging/Tests/Labs:  XR Chest AP Portable    Result Date: 01/11/2022   No active disease Kinnie Feil, MD  01/11/2022 12:12 PM    CT Angiogram Chest    Result Date: 01/01/2022  1.Limited evaluation of the subsegmental pulmonary arteries. No evidence of pulmonary embolism given this limitation. 2.Dilated ascending aorta measuring up to 4.1 cm. Demetrios Isaacs, MD 01/01/2022 7:57 AM    MRI Femur Left W WO Contrast    Result Date: 12/30/2021  1. Mild edema and atrophy involving the semimembranosus and biceps femoris muscles of the right and left thighs, noted to be bilateral and symmetric in appearance. Mild enhancement on the postcontrast images. Very mild edema in the anterior compartment muscles of the right and left thighs, with mild enhancement. Mild atrophy of the semimembranosus and biceps femoris muscles bilaterally. These findings are nonspecific, and may be seen in the setting  of muscle denervation or myositis. 2. No atrophy involving the anterior compartment muscles. 3. Mild edema in the proximal adductor longus and adductor magnus muscles bilaterally. 4. No areas of nonenhancement to suggest muscle necrosis. [name] Birks, MD 12/30/2021 3:22 PM    MRI Femur Right W WO Contrast    Result Date: 12/30/2021  1. Mild edema and atrophy involving the semimembranosus and biceps femoris muscles of the right and left thighs, noted to be bilateral and symmetric in appearance. Mild enhancement on the postcontrast images. Very mild edema in the anterior compartment muscles of the right and left thighs, with mild enhancement. Mild atrophy of the semimembranosus and biceps femoris muscles bilaterally. These findings are nonspecific, and may be seen in the setting of muscle denervation or myositis. 2. No atrophy involving the anterior compartment muscles. 3. Mild edema in the proximal adductor longus and adductor magnus muscles bilaterally. 4. No areas of nonenhancement to suggest muscle necrosis. [name] Birks, MD 12/30/2021 3:22 PM    CT Chest WO Contrast    Result Date: 12/28/2021  No evidence of malignancy in the chest. Carlynn Spry, MD 12/28/2021 2:41 PM      CT Abdomen Pelvis WO IV/ WO PO Cont    Result Date: 12/28/2021  Distended urinary bladder. Otherwise unremarkable noncontrast abdomen pelvis CT. Rocky Crafts, MD 12/28/2021 11:26 AM     Social History:    Prior Level of Function: minimal ambulation with assist  Assistive Devices: walker  Baseline Activity: minimal ambulation, w/c level of function  DME Currently at Home: at rehab currently  Home Living Arrangements: prior to rehab, pt lives with daughter  Type of Home: [facility]  Home Layout: multilevel    Subjective:   Patient is agreeable to participation in the therapy session. Nursing clears patient for therapy.     Patient Goal: to get strongter    Pain:   Scale: 5/10  Location: hands/feet  Intervention: repositioned    Objective:   Patient is in bed  with telemetry, peripheral IV, and indwelling urinary catheter in place.  Pt wore mask during therapy session:No      Cognitive Status and Neuro Exam:  A and O x 3. Follows all commands.  Sensation: c/o sensitivity B fingers, mid calf to feet  Coordination: impaired RAM L > R speed and quality  R hand dominant.    Musculoskeletal Examination  RUE ROM: WFL  LUE ROM: WFL  RLE ROM: WFL  LLE ROM: WFL    RUE Strength: grossly 4/5 to 4+/5  LUE Strength: grossly 4/5  RLE Strength: hip flex 2-/5, abd/add 2/5, knee ext 4-/5 flex 4-/5, dorsiflex 4/5  LLE Strength: hip flex 2-/5, ab/add 2-/5, knee ext 3+/5, flex 3+/5, dorsiflex 4/5  Functional Mobility  Rolling: independent with bedrails and UE for LE support  Supine to Sit: SBA with bedrails and UE for LE support  Scooting: CGA  Sit to Stand: min to mod A  Stand to Sit: min to mod A with decreased eccentric control  Transfers: min to mod A        Ambulation  PMP - Progressive Mobility Protocol   PMP Activity: Step 6 - Walks in Room  Distance Walked (ft) (Step 6,7): 10 Feet   Level of assistance required: min to mod A  Pattern: decreased heel strike B LE, scissored step/poor placement, decreased B knee/pelvis control but no frank buckling  Device Used: wheeled walker  Weightbearing Status: no restrictions      Balance  Static Sitting: good  Dynamic Sitting: good  Static Standing: fair- at walker  Dynamic Standing: fair- at walker    Participation and Activity Tolerance  Participation Effort: good  Endurance: fatigues quickly, but fully participatory              Patient left with call bell within reach, all needs met, SCDs not in place as found, fall mat in place, bed alarm n/a, chair alarm in place and all questions answered. RN notified of session outcome and patient response.     Goals:  Goals  Goal Formulation: With patient  Time for Goal Acheivement: 7 visits  Pt Will Perform Sit to Stand: with contact guard assist  Pt Will Transfer Bed/Chair: with rolling walker, with  contact guard assist  Pt Will Ambulate: 31-50 feet, with rolling walker, with contact guard assist  Pt Will Propel Wheelchair: > 150 feet, independent      PPE worn during session: gloves    Tech present: n/a  PPE worn by tech: N/A      Alvera SinghJaCynthia K (Cindy) Kylian Loh, PT, DPT  Pager [phone]      Time of Treatment  PT Received On: 01/27/22  Start Time: 0805  Stop Time: 0855  Time Calculation (min): 50 min

## 2022-01-27 NOTE — Progress Notes (Signed)
SW sent referral in [facility] to [facility] in anticipation of Pt's discharge and return to [place].      Luiz Blare liaison, is following.     [name]  MSW, [employer], ACM-SW  Discharge Planner   [email or web address]

## 2022-01-27 NOTE — Consults (Signed)
ARROW POWER MIDLINE INSERTION PROCEDURE       INDICATIONS: Therapy less than 28 days, no IV access    The midline procedure, risks, benefits were discussed with thepatient.  All questions were answered  patient verbalized understanding and agreed to proceed. Midline education/instructions provided to patient.    PROCEDURE DETAILS:   The patient was positioned and Ultrasound was used to confirm patency of the Left Brachial vein prior to obtaining venous access. The arm was scrubbed with 2% chlorhexidine per guidelines and a maximal sterile field was established for the patient.  The clinician was attired with cap, mask and sterile gown/gloves prior to start.     Arrow Power Midline:    A sterile cover was sheathed to the Ultrasound probe.  The vein was then revisualized and 1% lidocaine injected prior to puncture of the Left Brachial vein with a 21-gauge single-wall needle under direct sonographic guidance.  The guidewire was advanced through the needle, the needle was removed and a peel-away sheath was placed over the wire.  After measuring from the insertion site to the midline of the upper arm the catheter was trimmed to insure tip location below the axillary.  The catheter was then advanced through the peel-away sheath.  The sheath was removed.  The catheter was flushed with normal saline to confirm brisk blood return and capped.  The catheter was stabilized on the skin using a securement device.  Sterile transparent occlusive dressing applied using aseptic technique. Gauze dressing applied. Informed RN to change gauze dressing in 48 hours.    Patient did tolerate the procedure well.   Catheter Type: 4Fr Arrow Power Midline  Insertion Site: Left Brachial vein  Total length: 15cm  Internal Length:  15cm  External Length: 0cm  UAC: 29cm    Midline Reference #: [id number]  Midline Kit Lot#: [id number]  Midline Kit expiration date: 01-22-2023    Findings/Conclusions:  No signs of bleeding or symptoms of nerve  irritation noted at time of insertion procedure.    Tip location is below the level of the axilla in Brachial vein. Midline is ready for immediate use.    Midline is ready for immediate use    Patrici Ranks, RN

## 2022-01-27 NOTE — H&P (Signed)
[facility]PITAL[facility] ADMISSION HISTORY AND PHYSICAL EXAM    Date Time: 01/26/22 11:38 PM  Patient Name: [name],[name]  Attending Physician: Emeline Darling, MD  Primary Care Physician: Malcolm Metro, MD    CC: ongoing paraparesis      History of Presenting Illness:   [name] is a 66 y.o. female hx afib on Eliquis, HTN, HLD, DM, COPD, TIAs, COPD, bipolar disorder, [facility] admit 2/7-2/17/23 for rhabdomyolysis (peak CK 4464 - statin d/c'ed) + vomiting ?gastroparesis + starvation ketoacidosis + UTI, [facility] admit 2/18-2/20/23 for rhabdomyolysis (peak CK 2509) + transaminitis + bilateral leg weakness + right paresthesias, admit 3/4-3/13 for bilateral hand/leg weakness and paresthesias  (unclear etiology, suspect myositis s/p IVIG but had facial paresthesias so it was stopped) + leg muscle edema on MRI (no biopsy done) + non-ischemic cardiomyopathy (EF 43%) who presents to the hospital with ongoing paraparesis. She was at [facility] rehab and her hand strength improved somewhat but she is still paraparetic. She was discharged to [facility] today but his neurologist Dr. 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 These symptoms are sudden onset, severe intensity, without alleviating factors.    Past Medical History:     Past Medical History:   Diagnosis Date    Anemia     Asthma     Atrial fibrillation     Chronic obstructive pulmonary disease     Convulsions     Depression     Diabetes mellitus     diet controlled     Hyperlipidemia     Hypertension     TIA (transient ischemic attack)        Past Surgical History:     Past Surgical History:   Procedure Laterality Date    CARDIAC CATHETERIZATION  2017    HYSTERECTOMY      [facility]      TRIPLE LUMEN DIALYSIS CATH N/A 12/28/2021    Procedure: TRIPLE LUMEN DIALYSIS CATH;   Surgeon: Tamala Bari, MD;  Location: [facility] CARDIAC CATH;  Service: Interventional Radiology;  Laterality: N/A;       Family History:     Family History   Problem Relation Age of Onset    Stroke Maternal Grandmother        Social History:     Social History     Socioeconomic History    Marital status: Divorced     Spouse name: Not on file    Number of children: Not on file    Years of education: Not on file    Highest education level: Not on file   Occupational History    Not on file   Tobacco Use    Smoking status: Never    Smokeless tobacco: Never   Vaping Use    Vaping status: Never Used   Substance and Sexual Activity    Alcohol use: Never    Drug use: Never    Sexual activity: Not on file   Other Topics Concern    Dietary supplements / vitamins Not Asked    Anesthesia problems Not Asked    Blood thinners Not Asked    Pregnant Not Asked    Future Children Not Asked    Number of Pregnancies? Not Asked    Number of children Not Asked  Miscarriages / Abortions? Not Asked    Eats large amounts Not Asked    Excessive Sweets Not Asked    Skips meals Not Asked    Eats excessive starches Not Asked    Snacks or grazes Not Asked    Emotional eater Not Asked    Eats fried food Not Asked    Eats fast food Not Asked    Diet Center Not Asked    Doylene Bode Not Asked    [facility] Weight Loss Not Asked    Nutri-System Not Asked    Opti-Fast / Medi-Fast Not Asked    Overeaters Anonymous Not Asked    [facility] Not Asked    TOPS Not Asked    Weight Watchers Not Asked    Atkins Not Asked    Binging / Purging Not Asked    Calorie Counting Not Asked    Fasting Not Asked    High Protein Not Asked    Low Carb Not Asked    Low Fat Not Asked    [facility] Diet Not Asked    Slim Fast Not Asked    [facility] Not Asked    Stationary cycle or treadmill Not Asked    Gym/fitness Classes Not Asked    Home exercise/video Not Asked    Swimming Not Asked    Weight training Not Asked    Walking or running Not Asked    Hospitalization  Not Asked    Hypnosis Not Asked    Physical therapy Not Asked    Psychological therapy Not Asked    Residential program Not Asked    Acutrim No    Byetta No    Contrave No    Dexatrim No    Diethylpropion No    Fastin No    Fen - Phen No    Ionamin / Adipex No    Phentermine No    Qsymia No    Prozac No    Saxenda No    Topamax No    Wellbutrin No    Xenical (Orlistat, Alli) No    Other Med No    No impairment Not Asked    Walks with cane/crutch Not Asked    Requires a wheelchair Not Asked    Bedridden Not Asked    Are you currently being treated for depression? Not Asked    Do you snore? Not Asked    Are you receiving any medical or psychological services? Not Asked    Do you ever wake up at night gasping for breath? Not Asked    Do you have or have you been treated for an eating disorder? Not Asked    Anyone ever told you that you stop breathing while asleep? Not Asked    Do you exercise regularly? Not Asked    Have you or family member ever have trouble with anesthesia? Not Asked   Social History Narrative    Not on file     Social Determinants of Health     Financial Resource Strain: Low Risk  (01/04/2022)    Overall Financial Resource Strain (CARDIA)     Difficulty of Paying Living Expenses: Not very hard   Food Insecurity: No Food Insecurity (07/12/2020)    Hunger Vital Sign     Worried About Running Out of Food in the Last Year: Never true     Ran Out of Food in the Last Year: Never true   Transportation Needs: No Transportation Needs (  01/26/2022)    PRAPARE - Therapist, art (Medical): No     Lack of Transportation (Non-Medical): No   Physical Activity: Insufficiently Active (07/12/2020)    Exercise Vital Sign     Days of Exercise per Week: 2 days     Minutes of Exercise per Session: 20 min   Stress: Stress Concern Present (07/12/2020)    [employer] of Occupational Health - Occupational Stress Questionnaire     Feeling of Stress : To some extent   Social Connections: Moderately  Integrated (07/12/2020)    Social Connection and Isolation Panel [NHANES]     Frequency of Communication with Friends and Family: More than three times a week     Frequency of Social Gatherings with Friends and Family: Twice a week     Attends Religious Services: More than 4 times per year     Active Member of [employer] or Organizations: Yes     Attends Engineer, structural: More than 4 times per year     Marital Status: Divorced   Intimate Partner Violence: Not At Risk (07/12/2020)    Humiliation, Afraid, Rape, and Kick questionnaire     Fear of Current or Ex-Partner: No     Emotionally Abused: No     Physically Abused: No     Sexually Abused: No   Housing Stability: Low Risk  (01/04/2022)    Housing Stability Vital Sign     Unable to Pay for Housing in the Last Year: No     Number of Places Lived in the Last Year: 1     Unstable Housing in the Last Year: No       Allergies:     Allergies   Allergen Reactions    Singulair [Montelukast] Itching    Immune Globulin (Human) Itching and Facial Swelling     Pt complain of itchiness and swelling of lips and tongue. Saturation fine, bp elevated.    Myoview [Technetium-77m] Itching    Atorvastatin Other (See Comments)     Pt states she thinks it caused her to have rhabdo    Contrast [Iodinated Contrast Media]     Latex     Magnesium Chloride Itching    Nitroglycerin     Penicillins     Tetracyclines & Related        Medications:     Prior to Admission medications    Medication Sig Start Date End Date Taking? Authorizing Provider   acetaminophen (TYLENOL) 325 MG tablet Take 2 tablets (650 mg total) by mouth every 4 (four) hours as needed for Pain 08/29/18   Kolycheva, Galina N, DO   albuterol sulfate HFA (PROVENTIL) 108 (90 Base) MCG/ACT inhaler Inhale 2 puffs into the lungs every 6 (six) hours as needed for Wheezing 01/03/22   Frederich Cha, DO   apixaban (ELIQUIS) 5 MG Take 1 tablet (5 mg total) by mouth every 12 (twelve) hours 01/09/19   Mahlon Gammon, Belva Crome, MD    budesonide-formoterol (SYMBICORT) 80-4.5 MCG/ACT inhaler Inhale 2 puffs into the lungs daily    [provider]   calcium carbonate (TUMS) 500 MG chewable tablet Chew 2 tablets (1,000 mg) by mouth every 6 (six) hours as needed for Heartburn 01/03/22   Frederich Cha, DO   diphenhydrAMINE (BENADRYL) 25 mg capsule Take 1 capsule (25 mg) by mouth every 6 (six) hours as needed for Itching 01/03/22   Frederich Cha, DO   ferrous sulfate 324 (65 FE) MG  Tablet Delayed Response Take 1 tablet (324 mg) by mouth every other day 01/03/22   Frederich Cha, DO   HYDROmorphone (DILAUDID) 2 MG tablet Take 1 tablet (2 mg) by mouth every 6 (six) hours as needed (severe pain) 01/26/22 02/02/22  Anastasia Pall, Edson Snowball, MD   LORazepam (ATIVAN) 0.5 MG tablet Take 1 tablet (0.5 mg) by mouth nightly as needed for Anxiety 01/26/22   Anastasia Pall, Edson Snowball, MD   lurasidone (LATUDA) 120 MG Tab Take 1 tablet (120 mg) by mouth every evening 01/26/22   Anastasia Pall, Edson Snowball, MD   metoprolol succinate (TOPROL-XL) 25 MG 24 hr tablet Take 5 tablets (125 mg) by mouth nightly 01/26/22   Anastasia Pall, Edson Snowball, MD   midodrine (PROAMATINE) 10 MG tablet Take 1 tablet (10 mg) by mouth 2 (two) times daily with meals At 8am and 12pm 01/27/22   Anastasia Pall, Edson Snowball, MD   nortriptyline (PAMELOR) 10 MG capsule Take 1 capsule (10 mg) by mouth nightly 01/03/22   Frederich Cha, DO   ondansetron (ZOFRAN-ODT) 4 MG disintegrating tablet Take 1 tablet (4 mg) by mouth every 6 (six) hours as needed for Nausea 01/03/22   Frederich Cha, DO   pantoprazole (PROTONIX) 40 MG tablet Take 1 tablet (40 mg total) by mouth daily 08/29/18   Earl Many, DO   polyethylene glycol (MIRALAX) 17 g packet Take 17 g by mouth daily 01/27/22   Anastasia Pall, Edson Snowball, MD   QUEtiapine (SEROquel) 100 MG tablet Take 1 tablet (100 mg) by mouth nightly 01/26/22   Anastasia Pall, Edson Snowball, MD   rOPINIRole (REQUIP) 1 MG tablet Take 1 tablet (1 mg total) by mouth nightly 08/29/18   Earl Many, DO   senna-docusate (PERICOLACE) 8.6-50 MG per tablet Take 2 tablets by mouth nightly 01/26/22   Anastasia Pall, Edson Snowball, MD   tamsulosin [facility]) 0.4 MG Cap Take 1 capsule (0.4 mg) by mouth Daily after dinner 01/26/22   Anastasia Pall, Edson Snowball, MD   thiamine (B-1) 100 MG tablet Take 1 tablet (100 mg) by mouth daily 01/04/22   Frederich Cha, DO   vitamin B-6 (B-6) 50 MG tablet Take 1 tablet (50 mg) by mouth daily 01/04/22   Frederich Cha, DO   vitamin D (vitamin D3) 25 MCG (1000 UT) tablet Take 1 tablet (25 mcg) by mouth daily 01/04/22   Frederich Cha, DO   vitamins/minerals Tab Take 1 tablet by mouth daily 01/04/22   Frederich Cha, DO   zinc sulfate (ZINCATE) 220 (50 Zn) MG capsule Take 1 capsule (220 mg) by mouth daily 01/26/22   Anastasia Pall, Edson Snowball, MD       Review of Systems:   All other systems were reviewed and are negative except: as above    Physical Exam:     Vitals:    01/26/22 2355   BP: 125/75   Pulse: (!) 107   Resp:    Temp: 98.1 F (36.7 C)   SpO2: 100%       Intake and Output Summary (Last 24 hours) at Date Time  No intake or output data in the 24 hours ending 01/27/22 0138    General: awake, alert, oriented x 3; no acute distress.  HEENT: perrla, eomi, sclera anicteric  oropharynx clear without lesions, mucous membranes moist  Neck: supple, no lymphadenopathy, no thyromegaly, no JVD, no carotid bruits  Cardiovascular: regular rate and rhythm, no murmurs, rubs or gallops  Lungs: clear to  auscultation bilaterally, without wheezing, rhonchi, or rales  Abdomen: soft, non-tender, non-distended; no palpable masses, no hepatosplenomegaly, normoactive bowel sounds, no rebound or guarding  Extremities: no clubbing, cyanosis, or edema  Neuro: cranial nerves grossly intact, strength 5/5 in upper and 2-3/5 in lower extremities, bilateral leg paresthesias, follows commands  Skin: no rashes or lesions noted      Labs:     Results       Procedure Component Value Units Date/Time    Type and Screen [[id number]]  Collected: 01/26/22 2236    Specimen: Blood Updated: 01/27/22 0005     ABO Rh A POS     AB Screen Gel NEG    Narrative:      Pre-Surgical/Pre-Procedure->No  For transfusion?->No    PT/APTT [[id number]]  (Abnormal) Collected: 01/26/22 2236     Updated: 01/26/22 2310     PT 13.2 sec      PT INR 1.1     PTT 32 sec     GFR [[id number]] Collected: 01/26/22 2236     Updated: 01/26/22 2308     EGFR >60.0    Comprehensive metabolic panel [[id number]]  (Abnormal) Collected: 01/26/22 2236    Specimen: Blood Updated: 01/26/22 2308     Glucose 111 mg/dL      BUN 6.0 mg/dL      Creatinine 0.6 mg/dL      Sodium 629 mEq/L      Potassium 3.8 mEq/L      Chloride 105 mEq/L      CO2 22 mEq/L      Calcium 9.0 mg/dL      Protein, Total 5.8 g/dL      Albumin 2.7 g/dL      AST (SGOT) 16 U/L      ALT 11 U/L      Alkaline Phosphatase 75 U/L      Bilirubin, Total 0.3 mg/dL      Globulin 3.1 g/dL      Albumin/Globulin Ratio 0.9     Anion Gap 10.0    Lactate dehydrogenase [[id number]] Collected: 01/26/22 2236    Specimen: Blood Updated: 01/26/22 2307     LDH 212 U/L     Creatine Kinase (CK) [[id number]]  (Abnormal) Collected: 01/26/22 2236    Specimen: Blood Updated: 01/26/22 2306     Creatine Kinase (CK) 21 U/L     CBC and differential [[id number]]  (Abnormal) Collected: 01/26/22 2236    Specimen: Blood Updated: 01/26/22 2254     WBC 9.63 x10 3/uL      Hgb 9.4 g/dL      Hematocrit 59.5 %      Platelets 415 x10 3/uL      RBC 3.65 x10 6/uL      MCV 80.3 fL      MCH 25.8 pg      MCHC 32.1 g/dL      RDW 19 %      MPV 8.9 fL      Instrument Absolute Neutrophil Count 5.89 x10 3/uL      Neutrophils 61.2 %      Lymphocytes Automated 29.1 %      Monocytes 6.6 %      Eosinophils Automated 2.3 %      Basophils Automated 0.5 %      Immature Granulocytes 0.3 %      Nucleated RBC 0.0 /100 WBC      Neutrophils Absolute 5.89 x10 3/uL      Lymphocytes Absolute Automated 2.80 x10 3/uL  Monocytes Absolute Automated 0.64 x10 3/uL      Eosinophils Absolute  Automated 0.22 x10 3/uL      Basophils Absolute Automated 0.05 x10 3/uL      Immature Granulocytes Absolute 0.03 x10 3/uL      Absolute NRBC 0.00 x10 3/uL             Radiology Results (24 Hour)       ** No results found for the last 24 hours. **              Assessment:     Patient Active Problem List   Diagnosis    CVA (cerebral vascular accident)    Chest pain    Chest pain with high risk of acute coronary syndrome    Paroxysmal atrial fibrillation    Hypertension    Hyperlipidemia    Seizure disorder    History of gastrointestinal hemorrhage    Depression    Anemia    Asthma    Non-traumatic subcutaneous emphysema    Chest pain with moderate risk for cardiac etiology    Left-sided weakness    History of asthma    History of transient ischemic attack (TIA)    Type 2 diabetes mellitus    Syncope and collapse    Weakness of both legs    Hypokalemia    Thoracic back pain    Gastroparesis    Guillain Barr syndrome    Myositis    Stiff person syndrome       66 y.o. female hx afib on Eliquis, HTN, HLD, DM, COPD, TIAs, COPD, bipolar disorder, [facility] admit 2/7-2/17/23 for rhabdomyolysis (peak CK 4464 - statin d/c'ed) + vomiting ?gastroparesis + starvation ketoacidosis + UTI, [facility] admit 2/18-2/20/23 for rhabdomyolysis (peak CK 2509) + transaminitis + bilateral leg weakness + right paresthesias, admit 3/4-3/13 for bilateral hand/leg weakness and paresthesias  (unclear etiology, suspect myositis s/p IVIG but had facial paresthesias so it was stopped) + leg muscle edema on MRI (no biopsy done) + non-ischemic cardiomyopathy (EF 43%) who presents with ongoing paraparesis.     Plan:   Paraparesis - Hand weakness appears improved but paraparesis persists. Had positive GAD65 antibody resulted today so neurology sent back for readmission for stiff person syndrome with plasmapheresis and benzodiazepine trial. Quinton catheter. Neuro checks. PT/OT. Start Valium 5mg  q8h.     Hx afib on Eliquis, HTN, HLD, DM, COPD, TIAs, COPD,  bipolar disorder, [facility] admit 2/7-2/17/23 for rhabdomyolysis (peak CK 4464 - statin d/c'ed) + vomiting ?gastroparesis + starvation ketoacidosis + UTI, [facility] admit 2/18-2/20/23 for rhabdomyolysis (peak CK 2509) + transaminitis + bilateral leg weakness + right paresthesias, admit 3/4-3/13 for bilateral hand/leg weakness and paresthesias  (unclear etiology, suspect myositis s/p IVIG but had facial paresthesias so it was stopped) + leg muscle edema on MRI (no biopsy done) + non-ischemic cardiomyopathy (EF 43%) - Hold Eliquis for Quinton but restart after Quinton.     DVT/GI prophylaxis - SCDs. Hold Eliquis for [place] but restart after Quinton.     Status/Rationale: Inpatient neurotelemetry.     Signed by: Emeline Darling, MD, MD   cc:Malcolm Metro, MD

## 2022-01-27 NOTE — Sedation Documentation (Signed)
13 Fr x 15 cm Quinton was successfully inserted by Dr Colin Rhein. Sutures were anchored. Dressing was applied. No bleeding and hematoma noted. Patient was stable during and after the procedure. Transported back to room and gave report to [employer]

## 2022-01-27 NOTE — Progress Notes (Signed)
Pt admitted from ED @ 0500. VSS, tele applied. Belongings with pt and daughter. Pt made comfortable and oriented to room. Foley care done.     Pt is A&Ox4, follows commands, clear/weak speech. Facial numbness, tingling/numb/decreased to hands and legs. BUE cannot overcomes resistance, BLE flickers (with effort to overcome grav). Bed mobility, turns well. RA, dim. Tele, NS tachy. IVF ordered. C/o generalized pain. Continent, foley in place & unobstructed, placed 1 week ago per pt. NPO for Quinton cath placement r/t plasmapheresis, meds w/ sips.     Pt has advanced directive, informed daughter to bring in copy to put in pt chart.

## 2022-01-28 ENCOUNTER — Ambulatory Visit: Payer: Medicare Other

## 2022-01-28 ENCOUNTER — Encounter: Payer: Self-pay | Admitting: Diagnostic Radiology

## 2022-01-28 LAB — URINALYSIS REFLEX TO MICROSCOPIC EXAM - REFLEX TO CULTURE
Bilirubin, UA: NEGATIVE
Glucose, UA: NEGATIVE
Ketones UA: NEGATIVE
Nitrite, UA: NEGATIVE
Protein, UR: NEGATIVE
Specific Gravity UA: 1.006 (ref 1.001–1.035)
Urine pH: 5.5 (ref 5.0–8.0)
Urobilinogen, UA: NORMAL mg/dL (ref 0.2–2.0)

## 2022-01-28 LAB — CBC AND DIFFERENTIAL
Absolute NRBC: 0 10*3/uL (ref 0.00–0.00)
Basophils Absolute Automated: 0.03 10*3/uL (ref 0.00–0.08)
Basophils Automated: 0.4 %
Eosinophils Absolute Automated: 0.17 10*3/uL (ref 0.00–0.44)
Eosinophils Automated: 2.4 %
Hematocrit: 25.7 % — ABNORMAL LOW (ref 34.7–43.7)
Hgb: 8.2 g/dL — ABNORMAL LOW (ref 11.4–14.8)
Immature Granulocytes Absolute: 0.03 10*3/uL (ref 0.00–0.07)
Immature Granulocytes: 0.4 %
Instrument Absolute Neutrophil Count: 4.38 10*3/uL (ref 1.10–6.33)
Lymphocytes Absolute Automated: 2.02 10*3/uL (ref 0.42–3.22)
Lymphocytes Automated: 28.3 %
MCH: 26.1 pg (ref 25.1–33.5)
MCHC: 31.9 g/dL (ref 31.5–35.8)
MCV: 81.8 fL (ref 78.0–96.0)
MPV: 9.1 fL (ref 8.9–12.5)
Monocytes Absolute Automated: 0.5 10*3/uL (ref 0.21–0.85)
Monocytes: 7 %
Neutrophils Absolute: 4.38 10*3/uL (ref 1.10–6.33)
Neutrophils: 61.5 %
Nucleated RBC: 0 /100 WBC (ref 0.0–0.0)
Platelets: 364 10*3/uL — ABNORMAL HIGH (ref 142–346)
RBC: 3.14 10*6/uL — ABNORMAL LOW (ref 3.90–5.10)
RDW: 18 % — ABNORMAL HIGH (ref 11–15)
WBC: 7.13 10*3/uL (ref 3.10–9.50)

## 2022-01-28 LAB — COMPREHENSIVE METABOLIC PANEL
ALT: 11 U/L (ref 0–55)
AST (SGOT): 15 U/L (ref 5–41)
Albumin/Globulin Ratio: 0.8 — ABNORMAL LOW (ref 0.9–2.2)
Albumin: 2.5 g/dL — ABNORMAL LOW (ref 3.5–5.0)
Alkaline Phosphatase: 64 U/L (ref 37–117)
Anion Gap: 9 (ref 5.0–15.0)
BUN: 4 mg/dL — ABNORMAL LOW (ref 7.0–21.0)
Bilirubin, Total: 0.3 mg/dL (ref 0.2–1.2)
CO2: 23 mEq/L (ref 17–29)
Calcium: 8.7 mg/dL (ref 8.5–10.5)
Chloride: 105 mEq/L (ref 99–111)
Creatinine: 0.5 mg/dL (ref 0.4–1.0)
Globulin: 3.1 g/dL (ref 2.0–3.6)
Glucose: 88 mg/dL (ref 70–100)
Potassium: 3.8 mEq/L (ref 3.5–5.3)
Protein, Total: 5.6 g/dL — ABNORMAL LOW (ref 6.0–8.3)
Sodium: 137 mEq/L (ref 135–145)

## 2022-01-28 LAB — MAGNESIUM: Magnesium: 1.8 mg/dL (ref 1.6–2.6)

## 2022-01-28 LAB — FIBRINOGEN: Fibrinogen: 420 mg/dL — ABNORMAL HIGH (ref 181–413)

## 2022-01-28 LAB — GFR: EGFR: >60

## 2022-01-28 LAB — CALCIUM, IONIZED: Calcium, Ionized: 2.51 mEq/L (ref 2.30–2.58)

## 2022-01-28 MED ORDER — CALCIUM CARBONATE ANTACID 500 MG PO CHEW
1000.0000 mg | CHEWABLE_TABLET | Freq: Four times a day (QID) | ORAL | Status: DC | PRN
Start: 2022-01-28 — End: 2022-02-08

## 2022-01-28 MED ORDER — CALCIUM GLUCONATE-NACL 1-0.675 GM/50ML-% IV SOLN
1.0000 g | INTRAVENOUS | Status: AC
Start: 2022-01-28 — End: 2022-01-28
  Administered 2022-01-28 (×3): 1 g via INTRAVENOUS
  Filled 2022-01-28: qty 50

## 2022-01-28 MED ORDER — DIPHENHYDRAMINE HCL 50 MG/ML IJ SOLN
25.0000 mg | Freq: Four times a day (QID) | INTRAMUSCULAR | Status: DC | PRN
Start: 2022-01-28 — End: 2022-02-08
  Administered 2022-01-31 – 2022-02-07 (×4): 25 mg via INTRAVENOUS
  Filled 2022-01-28 (×6): qty 1

## 2022-01-28 MED ORDER — ACD FORMULA A 0.73-2.45-2.2 GM/100ML VI SOLN (APHERESIS)
300.0000 mL | Status: DC
Start: 2022-01-28 — End: 2022-02-08
  Administered 2022-01-28: 300 mL via INTRAVENOUS

## 2022-01-28 MED ORDER — ALBUMIN HUMAN/BIOSIMILIAR 5% IV SOLN (WRAP)
25.0000 g | INTRAVENOUS | Status: AC
Start: 2022-01-28 — End: 2022-01-28
  Administered 2022-01-28: 120 g via INTRAVENOUS
  Filled 2022-01-28: qty 2500

## 2022-01-28 NOTE — Progress Notes (Addendum)
[facility] HOSPITALIST PROGRESS NOTE    Date Time: 01/28/22 12:44 PM  Patient Name: [name],[name]  Attending Physician: Les Pou, MD        History of Presenting Illness and Interval History/24 hour Events:   HPI per Admitting Provider  " [name] is a 66 y.o. female hx afib on Eliquis, HTN, HLD, DM, COPD, TIAs, COPD, bipolar disorder, [facility] admit 2/7-2/17/23 for rhabdomyolysis (peak CK 4464 - statin d/c'ed) + vomiting ?gastroparesis + starvation ketoacidosis + UTI, [facility] admit 2/18-2/20/23 for rhabdomyolysis (peak CK 2509) + transaminitis + bilateral leg weakness + right paresthesias, admit 3/4-3/13 for bilateral hand/leg weakness and paresthesias  (unclear etiology, suspect myositis s/p IVIG but had facial paresthesias so it was stopped) + leg muscle edema on MRI (no biopsy done) + non-ischemic cardiomyopathy (EF 43%) who presents to the hospital with ongoing paraparesis. She was at [facility] rehab and her hand strength improved somewhat but she is still paraparetic. She was discharged to [facility] today but his neurologist Dr. 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 These symptoms are sudden onset, severe intensity, without alleviating factors.  "    Subjective 4/6: NAE. Patient reports no new focal complaints. Continues to show improvement with weakens, but sill reports limb stiffness.      Subjective 4/7: NAE. Patient reports improved mobility today after starting valium yesterday.      Physical Exam:     Vitals:    01/28/22 1100   BP: 142/76   Pulse: (!) 114   Resp: 17   Temp: 98.2 F (36.8 C)   SpO2: 96%       Intake and Output Summary (Last 24 hours) at Date Time    Intake/Output Summary (Last 24 hours) at 01/28/2022 1244  Last data filed at 01/28/2022 0300  Gross per 24 hour   Intake --    Output 900 ml   Net -900 ml       General: awake, alert, oriented x 3; no acute distress.  HEENT: NC/AT.  MMM  Neck: supple  Cardiovascular: regular rate and rhythm, no murmurs, rubs or gallops  Lungs: clear to auscultation bilaterally, without wheezing, rhonchi, or rales  Abdomen: soft, non-tender, non-distended; normoactive bowel sounds, no rebound or guarding  Extremities: increased tone LLE>RLE.    Neuro: cranial nerves grossly intact, strength 5/5 in upper and 3/5 lower extremities, sensation intact,   Skin: no rashes or lesions noted      Medications:     Current Facility-Administered Medications   Medication Dose Route Frequency    apixaban  5 mg Oral Q12H SCH    budesonide-formoterol  2 puff Inhalation BID    calcium GLUConate  1 g Intravenous Q45 MIN    diazePAM  5 mg Oral Q8H SCH    lurasidone  120 mg Oral QHS    metoprolol succinate XL  25 mg Oral Q12H    midodrine  10 mg Oral BID Meals    pantoprazole  40 mg Oral QAM    polyethylene glycol  17 g Oral Daily    rOPINIRole  0.5 mg Oral QHS    senna-docusate  2 tablet Oral QHS    tamsulosin  0.4 mg Oral Daily    thiamine  100 mg Oral Daily    vitamin B-6  50 mg Oral Daily    vitamin D  25 mcg Oral Daily    vitamins/minerals  1 tablet Oral Daily    zinc sulfate  220 mg Oral Daily         Labs:     Results       Procedure Component Value Units Date/Time    Fibrinogen [[id number]]  (Abnormal) Collected: 01/28/22 0441    Specimen: Blood Updated: 01/28/22 0556     Fibrinogen 420 mg/dL     Comprehensive metabolic panel [[id number]]  (Abnormal) Collected: 01/28/22 0441    Specimen: Blood Updated: 01/28/22 0556     Glucose 88 mg/dL      BUN 4.0 mg/dL      Creatinine 0.5 mg/dL      Sodium 132 mEq/L      Potassium 3.8 mEq/L      Chloride 105 mEq/L      CO2 23 mEq/L      Calcium 8.7 mg/dL      Protein, Total 5.6 g/dL      Albumin 2.5 g/dL      AST (SGOT) 15 U/L      ALT 11 U/L      Alkaline Phosphatase 64 U/L      Bilirubin, Total 0.3 mg/dL      Globulin 3.1 g/dL       Albumin/Globulin Ratio 0.8     Anion Gap 9.0    GFR [[id number]] Collected: 01/28/22 0441     Updated: 01/28/22 0556     EGFR >60.0    Magnesium [[id number]] Collected: 01/28/22 0441    Specimen: Blood Updated: 01/28/22 0556     Magnesium 1.8 mg/dL     Calcium, ionized [[id number]] Collected: 01/28/22 0441    Specimen: Blood Updated: 01/28/22 0538     Calcium, Ionized 2.51 mEq/L     CBC and differential [[id number]]  (Abnormal) Collected: 01/28/22 0441    Specimen: Blood Updated: 01/28/22 0535     WBC 7.13 x10 3/uL      Hgb 8.2 g/dL      Hematocrit 88.4 %      Platelets 364 x10 3/uL      RBC 3.14 x10 6/uL      MCV 81.8 fL      MCH 26.1 pg      MCHC 31.9 g/dL      RDW 18 %      MPV 9.1 fL      Instrument Absolute Neutrophil Count 4.38 x10 3/uL      Neutrophils 61.5 %      Lymphocytes Automated 28.3 %      Monocytes 7.0 %      Eosinophils Automated 2.4 %      Basophils Automated 0.4 %      Immature Granulocytes 0.4 %      Nucleated RBC 0.0 /100 WBC      Neutrophils Absolute 4.38 x10 3/uL      Lymphocytes Absolute Automated 2.02 x10 3/uL      Monocytes Absolute Automated 0.50 x10 3/uL      Eosinophils Absolute Automated 0.17 x10 3/uL      Basophils Absolute Automated 0.03 x10 3/uL      Immature Granulocytes Absolute 0.03 x10 3/uL      Absolute NRBC 0.00 x10 3/uL     Thyroid Peroxidase Antibody [[id number]] Collected: 01/28/22 0441    Specimen: Blood Updated: 01/28/22 0514                  @  [email or web address]    Radiology:     Radiology Results (24 Hour)       Procedure Component Value Units Date/Time    Non-Tunneled Cath Placement [facility]) [[id number]] Collected: 01/28/22 1123    Order Status: Completed Updated: 01/28/22 1130    Narrative:      PROCEDURE: Right internal jugular approach nontunneled central venous  catheter placement.    HISTORY: Quinton placement requested.    OPERATOR(S):  Attending VIR Physician(s): Verline Lema, M.D.  Advanced Practice Provider: None    CONTRAST:  Contrast agent: None  Contrast volume (mL):  0    RADIATION PARAMETERS:  Fluoroscopy time (mm:ss): 00:30.1    Reference air kerma (mGy): 4.154     TECHNIQUE/FINDINGS: Informed consent for the procedure including risks,  benefits and alternatives was obtained. A safety timeout was performed  and documented with all members of the procedure team present, including  verification of patient identification, correct procedure, procedure  site, and laterality. The patient was positioned supine.     Preparation: The site was prepared and draped using all elements of  maximal sterile barrier technique including sterile gloves, sterile  gown, cap, mask, large sterile sheet, sterile ultrasound probe cover,  hand hygiene and cutaneous antisepsis with 2% chlorhexidine.  Medical reason for site preparation exception: Not applicable.    Sedation: None.    All indicated images were saved to PACS.    Using ultrasound guidance the patient was evaluated for an appropriate  access site. Demonstrating patency, the right internal jugular vein was  selected. The vein was entered using a micropuncture needle utilizing  realtime ultrasound visualization. A microwire was passed and a 5 [place]  introducer sheath was placed. Image documentation of venous access and  final catheter placement is maintained in the patient medical record. A  0.035 inch wire was used to access the inferior vena cava. Serial  dilatations were performed. Fluoroscopic guidance was then used to place  the catheter in the upper right right atrium.     Sterile dressing was applied. Patient tolerated the procedure well.      Impression:        Right internal jugular approach non-tunneled (Quinton) central venous  catheter placement.    PLAN:  Catheter is ready for use.      Verline Lema MD  01/28/2022 11:27 AM            Lines  Patient Lines/Drains/Airways Status       Active PICC Line / CVC Line / PIV Line / Drain / Airway / Intraosseous Line / Epidural Line / ART Line / Line / Wound / Pressure Ulcer / NG/OG Tube        Name Placement date Placement time Site Days    Urethral Catheter Non-latex;Straight-tip 16 Fr. 01/23/22  1457  Non-latex;Straight-tip  3    Wound 01/03/22 Stage 1 Coccyx;Sacrum unblanchable redness 01/03/22  2200  Coccyx;Sacrum  23                  Urethral Catheter Non-latex;Straight-tip 16 Fr. (Active)   Catheter necessity reviewed? Yes 01/27/22 1100   Site Assessment WDL 01/27/22 0800   Pericare (With Urinary Catheter) Yes 01/27/22 1100   Collection Container Standard drainage bag with urimeter 01/27/22 1100   Securement Method Securement device 01/27/22 1100   Reason for Continuing Urinary Catheterization past POD 1 Acute urinary retention due to nerve injury 01/27/22 0800   Positioned catheter tubing for unobstructed urine flow: Yes 01/27/22 1100  Urine Output (mL) 300 mL 01/26/22 1500   Urine Output (mL) Total 300 mL 01/26/22 1500   Number of days: 3         Assessment:     Patient Active Problem List   Diagnosis    CVA (cerebral vascular accident)    Chest pain    Chest pain with high risk of acute coronary syndrome    Paroxysmal atrial fibrillation    Hypertension    Hyperlipidemia    Seizure disorder    History of gastrointestinal hemorrhage    Depression    Anemia    Asthma    Non-traumatic subcutaneous emphysema    Chest pain with moderate risk for cardiac etiology    Left-sided weakness    History of asthma    History of transient ischemic attack (TIA)    Type 2 diabetes mellitus    Syncope and collapse    Weakness of both legs    Hypokalemia    Thoracic back pain    Gastroparesis    Guillain Barr syndrome    Myositis    Stiff person syndrome     66 y.o. female hx afib on Eliquis, HTN, HLD, DM, COPD, TIAs, COPD, bipolar disorder, [facility] admit 2/7-2/17/23 for rhabdomyolysis (peak CK 4464 - statin d/c'ed) + vomiting ?gastroparesis + starvation ketoacidosis + UTI, [facility] admit 2/18-2/20/23 for rhabdomyolysis (peak CK 2509) + transaminitis + bilateral leg weakness + right paresthesias, admit  3/4-3/13 for bilateral hand/leg weakness and paresthesias  (unclear etiology, suspect myositis s/p IVIG but had facial paresthesias so it was stopped) + leg muscle edema on MRI (no biopsy done) + non-ischemic cardiomyopathy (EF 43%) who presents with ongoing paraparesis.     Plan:   #Neuro: Weakness, Stiffness, Ambulatory Dysfunction for 6 weeks.  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 Hand weakness appears improved but paraparesis persists.  -Neurology following  -s/p Quinton Catheter.  Plan for PLEX #1 of 3 (F/M/W)  -Benzodiazepine trial. Cont Valium 5mg  q8h.   -Routine Cancer screening as an outpatient.  -TPO Ab  pending  -Quinton catheter.   -Neuro checks.   -PT/OT.     #Anemia of chronic disease  -Iron profile reviewed, B12 within normal limits  -Hemoglobin remains stable on Eliquis     Hx afib on Eliquis, HTN, HLD, DM, COPD, TIAs, COPD, bipolar disorder, [facility] admit 2/7-2/17/23 for rhabdomyolysis (peak CK 4464 - statin d/c'ed) + vomiting ?gastroparesis + starvation ketoacidosis + UTI, [facility] admit 2/18-2/20/23 for rhabdomyolysis (peak CK 2509) + transaminitis + bilateral leg weakness + right paresthesias, admit 3/4-3/13 for bilateral hand/leg weakness and paresthesias  (unclear etiology, suspect myositis s/p IVIG but had facial paresthesias so it was stopped) + leg muscle edema on MRI (no biopsy done) + non-ischemic cardiomyopathy (EF 43%)      DVT/GI prophylaxis - SCDs. Eliquis BID               Recent Labs     01/28/22  0441 01/27/22  0423 01/26/22  2236   Potassium 3.8 3.2* 3.8       Diagnosis: Hypokalemia        Signed by: Les Pou, MD, MD, MBA  cc:Malcolm Metro, MD

## 2022-01-28 NOTE — Procedures (Signed)
Treatment was Initiated without a complications, stable VSS, aseptically accessed Rt Ij CVC, both ports are patent, clean and intact dressing, no s/s of bleeding, tolerating tx very well. Albumin 5% and Calcium Gluconate was administered during tx. Monitor the patient's vital signs every 15 minutes, this is her 1st TPE TX. Successfully completed tx, no reactions from citrate, no complains of weakness, alert and oriented x 4. Called Dr. Pati Gallo that the patient is doing well. Rinse back done, flushed both ports with NSS,clamped and placed new caps. Post TPE Tx report was given to the Nurse in charge. Safety was implemented by monitoring patient reactions to Tx. No Post TPE complications.

## 2022-01-28 NOTE — PT Progress Note (Signed)
[facility]   Physical Therapy Cancellation Note      Patient:  [name] MRN#:  [id number]  Unit:  [facility] TOWER 6 EAST Room/Bed:  M578/I696.29    01/28/2022  Time: 1350      Pt not seen for physical therapy secondary to pt undergoing PLEX at bedside. Will follow and reattempt as appropriate.    Leavy Cella, PT, DPT Pager # [phone] 01/28/2022 1:49 PM

## 2022-01-28 NOTE — Malnutrition Assessment (Signed)
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 female patient.   [id number]    Malnutrition Assessment   Malnutrition Documentation    Moderate malnutrition related to inadequate protein energy intake in the setting of acute illness as evidenced by intake <75% of estimated energy requirements for >1 week,  7.5% weight loss in 3 months,  mild depletion of muscle loss (temporalis, pectoralis major, quadriceps, gastrocnemius).       Mathis Dad Carlissa Pesola, RDN      If physician disagrees with this assessment see addendum.

## 2022-01-28 NOTE — Procedures (Signed)
APHERESIS PROCEDURE NOTE - Therapeutic Plasmapheresis # 1    Date Time: 01/28/22 4:51 PM  Patient Name: [name], [name]    Procedure:   Therapeutic Plasma Exchange (Plasmapheresis)    Providers Performing:   Vedia Coffer, MD  Robbie Lis RN    Consent:   This procedure has been fully reviewed with the patient and written informed consent has been obtained.  *Consent in the file.    Timeout:   Was performed as planned    Brief Notes:   [name] is a 66 y.o. 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 She has h/o afib (on eliquis), HTN, HLD, DM, COPD, TIA, bipolar d/o, recent rhabdomyolysis (02/23), recent [facility] admission 3/13-01/26/22 for weakness and discharged to acute rehab 01/26/22. Dr. Joyce Gross requested for PLEX. The goals, principles, risks and benefits associated with TPE have been explained to this patient in details. The plan of care has also been discussed with the patient who has verbalized understanding and agreement with the treatment options. All her questions have been answered to her satisfaction. She has voluntarily signed the informed consent form electronically using iMed consent form. Thereafter, the TPE procedure has been initiated and completed without any adverse effect.       Past Medical History:   Diagnosis Date    Anemia     Asthma     Atrial fibrillation     Chronic obstructive pulmonary disease     Convulsions     Depression     Diabetes mellitus     diet controlled     Hyperlipidemia     Hypertension     TIA (transient ischemic attack)        Past Surgical History:   Procedure Laterality Date    CARDIAC CATHETERIZATION  2017    HYSTERECTOMY      NON-TUNNELED CATH PLACEMENT [facility]) N/A 01/27/2022    Procedure: Mena Goes;  Surgeon: Verline Lema, MD;  Location: [facility] CARDIAC CATH;  Service: Interventional Radiology;  Laterality: N/A;    pci      TRIPLE LUMEN DIALYSIS CATH N/A 12/28/2021    Procedure: TRIPLE LUMEN DIALYSIS CATH;  Surgeon: Tamala Bari, MD;  Location: [facility] CARDIAC CATH;  Service:  Interventional Radiology;  Laterality: N/A;       Allergies   Allergen Reactions    Singulair [Montelukast] Itching    Immune Globulin (Human) Itching and Facial Swelling     Pt complain of itchiness and swelling of lips and tongue. Saturation fine, bp elevated.    Myoview [Technetium-1m] Itching    Atorvastatin Other (See Comments)     Pt states she thinks it caused her to have rhabdo    Contrast [Iodinated Contrast Media]     Latex     Magnesium Chloride Itching    Nitroglycerin     Penicillins     Tetracyclines & Related        Blood pressure 127/77, pulse 70, temperature 98.4 F (36.9 C), temperature source Axillary, resp. rate 18, height 1.651 m (5\' 5" ), weight 61.7 kg (136 lb 1.6 oz), SpO2 98 %.    Alert  Oriented  Stable vitals    Pre-pheresis Labs:      Recent BMP   Recent Labs     01/28/22  0441   Glucose 88   BUN 4.0*   Creatinine 0.5   Calcium 8.7   Sodium 137   Potassium 3.8   Chloride 105   CO2 23     Recent CBC  WITH DIFF   Recent Labs     01/28/22  0441   RBC 3.14*   Hgb 8.2*   Hematocrit 25.7*   MCV 81.8   MCHC 31.9   RDW 18*   MPV 9.1   Platelets 364*       ionized Ca -2.51   Fib - 420     5% albumin was used for replacement fluid.    Complications:   None    Signed by: Vedia CofferAbdus Lasasha Brophy, MD, MD  Date/Time: 01/28/22 4:51 PM

## 2022-01-28 NOTE — Plan of Care (Addendum)
Problem: Moderate/High Fall Risk Score >5  Goal: Patient will remain free of falls  Flowsheets (Taken 01/27/2022 2000)  High (Greater than 13):   LOW-Fall Interventions Appropriate for Low Fall Risk   LOW-Anticoagulation education for injury risk   MOD-Use of assistive devices -Bedside Commode if appropriate   MOD-Utilize diversion activities   MOD-Perform dangle, stand, walk (DSW) prior to mobilization   MOD-Request PT/OT consult order for patients with gait/mobility impairment   MOD-Include family in multidisciplinary POC discussions   MOD-Place Fall Risk level on whiteboard in room   HIGH-Visual cue at entrance to patient's room   HIGH-Bed alarm on at all times while patient in bed   HIGH-Apply yellow "Fall Risk" arm band   HIGH-Initiate use of floor mats as appropriate   HIGH-Consider use of low bed     Problem: Compromised Tissue integrity  Goal: Damaged tissue is healing and protected  Flowsheets (Taken 01/28/2022 0200)  Damaged tissue is healing and protected:   Monitor/assess Braden scale every shift   Reposition patient every 2 hours and as needed unless able to reposition self   Increase activity as tolerated/progressive mobility   Relieve pressure to bony prominences for patients at moderate and high risk   Avoid shearing injuries   Keep intact skin clean and dry   Use bath wipes, not soap and water, for daily bathing   Use incontinence wipes for cleaning urine, stool and caustic drainage. Foley care as needed   Monitor patient's hygiene practices   Encourage use of lotion/moisturizer on skin     Problem: Neurological Deficit  Goal: Neurological status is stable or improving  Flowsheets (Taken 01/28/2022 0200)  Neurological status is stable or improving:   Monitor/assess/document neurological assessment (Stroke: every 4 hours)   Observe for seizure activity and initiate seizure precautions if indicated   Perform CAM Assessment     Problem: Peripheral Neurovascular Impairment  Goal: Extremity color, movement,  sensation are maintained or improved  Flowsheets (Taken 01/28/2022 0200)  Extremity color, movement, sensation are maintained or improved:   Increase mobility as tolerated/progressive mobility   Assess and monitor application of corrective devices (cast, brace, splint), check skin integrity   Assess extremity for proper alignment   Teach/review/reinforce ankle pump exercises   VTE Prevention: Administer anticoagulant(s) and/or apply anti-embolism stockings/devices as ordered     Problem: Impaired Mobility  Goal: Mobility/Activity is maintained at optimal level for patient  Flowsheets (Taken 01/28/2022 0200)  Mobility/activity is maintained at optimal level for patient:   Encourage independent activity per ability   Plan activities to conserve energy, plan rest periods   Perform active/passive ROM   Reposition patient every 2 hours and as needed unless able to reposition self   Maintain proper body alignment   Increase mobility as tolerated/progressive mobility   Assess for changes in respiratory status, level of consciousness and/or development of fatigue   Consult/collaborate with Physical Therapy and/or Occupational Therapy     Problem: Anxiety  Goal: Anxiety is at a manageable level  Flowsheets (Taken 01/28/2022 0200)  Anxiety is at a manageable level:   Inform/explain to patient/patient care companion all tests/procedures/treatment/care prior to initiation   Assess emotional status and coping mechanisms   Facilitate expression of feelings, fears, concerns, anxiety   Provide and maintain a safe environment   Provide alternatives to reduce anxiety   Consult/collaborate with ancillary departments   Provide emotional support   Assist in developing anxiety-reducing skills    NURSING PROGRESS NOTE: STROKE UNIT  Patient Name: [name] (66 y.o. female)  Admission Date: 01/26/2022 [facility] Day 2)      Recent Labs   Lab 01/27/22  0423   Sodium 138   Potassium 3.2*   Chloride 106   CO2 23   BUN 5.0*   Creatinine 0.6   EGFR  >60.0   Glucose 102*   Calcium 8.7       Recent Labs   Lab 01/27/22  0423   WBC 8.41   Hgb 8.4*   Hematocrit 26.9*   Platelets 371*         Patient Lines/Drains/Airways Status       Active Lines, Drains and Airways       Name Placement date Placement time Site Days    Temporary Catheter - Non-Tunneled 01/27/22 Internal Jugular Right 01/27/22  1525  -- less than 1    Midline IV 01/27/22 Anterior;Left Upper Arm 01/27/22  1738  Upper Arm  less than 1                       Braden Scale Score: 16 (01/27/22 2000)      Skin Integrity: Scars  Bruising Skin Location: scattered    Wound 01/03/22 Stage 1 Coccyx;Sacrum unblanchable redness (Active)   Date First Assessed/Time First Assessed: 01/03/22 2200   Pressure Injury Staging (WOCN/ Trained RNs Only): Stage 1  Location: Coccyx;Sacrum  Wound Description (Comments): unblanchable redness  Present on Admission: Yes      Assessments 01/03/2022 11:00 PM 01/27/2022  2:20 PM   Site Description Clean;Dry;Intact Dry;Clean   Peri-wound Description Clean;Dry Dry;Clean;Intact   Closure Open to air Open to air   Treatments Site care --   Dressing Foam Open to air   Dressing Changed New --   Dressing Status Clean;Dry;Intact --       No associated orders.       Puncture Site 01/27/22 Venous Anterior;Right Neck (Active)   Date First Assessed/Time First Assessed: 01/27/22 1518   Puncture site location: Venous  Wound Location Orientation: Anterior;Right  Location: Neck      Assessments 01/27/2022  3:28 PM 01/27/2022  8:00 PM   Site Assessment Clean;Dry;Intact;Soft;Non Tender Clean;Dry;Intact   Dressing Status Dry;Intact Clean;Dry;Intact   Dressing Changed New --   Drainage Amount None None       No associated orders.       Safety Checklist   1:1 Sitter N    Avasys N       Interpreter Services:  Does the patient require an Interpreter? N    If yes, what form of interpreter services was used? N/A     If family was utilized, is interpreter waiver form signed and in the chart?  N/a      ASSESSMENT/PLAN:    Last BM: PTA, on regimen    Pending Orders: plasmapheresis, drug trial, pt/ot    Discharge Plan: tbd    POC / Family Update: patient    Shift Note: Pt is A&Ox4, follows commands, clear/weak speech. Facial numbness, tingling/numb/decreased to hands and legs. BUE overcomes resistance, RLE flicker, LLE cannot overcome resistance. Assist x1-2 to [facility]. RA, dim. No tele. C/o generalized pain, PRN dilaudid x1 with good effect. R IJ clear/dry/intact, no leakage. Midline dressing clean/dry/intact, gauze in place. Continent, SC . Reg diet, thins, pills whole. Fall precautions in place.     PPE worn in patient's room: mask and gloves

## 2022-01-28 NOTE — Progress Notes (Signed)
Nutritional Support Services  Nutrition Assessment    [name] 66 y.o. female   MRN: [id number]    NUTRITION RECOMMENDATIONS:    Continue Regular diet.  Will add Ensure supplements TID with meals.    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 PMHx of HTN, HLD, DM, COPD, TIAs, and bipolar disorder. Per chart review highest A1c was 6.0 on 05/21/19 but subsequent A1c on 06/17/19 was 5.7; now 5.3. Unclear when/why Ozempic was prescribed but it was included on a med list from Dec 2022 which likely contributed to patients lack of appetite and significant unintentional weight loss. Ozempic was discontinued during hospital admission mid March 2023.    Pt reports 40 lb weight loss in the past 3 months and a significantly decreased appetite for the past month. She states she was essentially unable to eat despite trying. In the past few days she reports her appetite has started to pick back up and she has been able to eat more normally. Pt agreeable to adding supplements while she's admitted.    Diet/Social Hx:   Orders Placed This Encounter   Procedures    Diet regular       ANTHROPOMETRIC  Height: 165.1 cm (5\' 5" )  Weight: 61.7 kg (136 lb 1.6 oz)    Nutrition Focused Physical Exam (NFPE):   Head: temple region: slight depression with decrease in muscle tone/resistance (mild muscle loss - temporalis) and orbital region: slightly dark circles, somewhat hollow look, some decrease in bounce back of fat pads (mild fat loss)  Upper Body: clavicle bone region: some protrusion of the clavicle with decrease in muscle tone/resistance (mild muscle loss - pectoralis major) and shoulder and Acromion bone region: rounded, curves at shoulder/arm juncture, feel muscle tone/resistance  (no wasting observed)  Lower Body: anterior thigh and patellar region: slight depression along inner/outer thigh, patella slightly prominent, feel decrease in muscle tone/resistance in quadriceps to the patella (mild muscle loss - quadriceps) and posterior calf region: some shape to the bulb, but not well-developed, decrease in muscle tone/resistance (mild muscle loss - gastrocnemius)  GI function:  Edema:  Skin:    ESTIMATED NEEDS:      Pertinent Medications:   Thiamine, Vitamin B6, Vitamin D, MVI, Zinc      Allergies:   Allergies   Allergen Reactions    Singulair [Montelukast] Itching    Immune Globulin (Human) Itching and Facial Swelling     Pt complain of itchiness and swelling of lips and tongue. Saturation fine, bp elevated.    Myoview [Technetium-28m] Itching    Atorvastatin Other (See Comments)     Pt states she thinks it caused her to have rhabdo    Contrast [Iodinated Contrast Media]     Latex     Magnesium Chloride Itching    Nitroglycerin     Penicillins     Tetracyclines & Related        Pertinent labs:  Recent Labs   Lab 01/28/22  0441   Sodium 137   Potassium 3.8   Chloride 105   CO2  23   BUN 4.0*   Creatinine 0.5   Calcium 8.7   Albumin 2.5*   Protein, Total 5.6*   Bilirubin, Total 0.3   Alkaline Phosphatase 64   ALT 11   AST (SGOT) 15   Glucose 88     Lab Results   Component Value Date    HGBA1C 5.3 12/27/2021                                                                     Nutrition Diagnosis    Moderate malnutrition related to inadequate protein energy intake in the setting of acute illness as evidenced by intake <75% of estimated energy requirements for >1 week,  7.5% weight loss in 3 months,  mild depletion of muscle loss (temporalis, pectoralis major, quadriceps, gastrocnemius).                                                               Monitoring     Monitor weight, labs, intakes, GI status and POC.      Sheli Talene Glastetter,RDN  Clinical Dietitian   RD Office [phone] or available on Secure  Chat

## 2022-01-28 NOTE — Progress Notes (Signed)
[facility] Consultation Progress Note    Date Time: 01/28/22 9:45 AM  Patient Name: [name]  Outpatient Neurologist :    CC: LE weakness, paresthesias           Neurology Attending Addendum:    I have personally reviewed the interval history, images and pertinent test results and I have personally examined the patient and confirmed the major physical findings of this resident/NP/PA note.  Also, I have noted any changes since the note was written as well as added additional findings and recommendations.   I reviewed the brain imaging personally and I agree with the findings   In summary:     'im very sure the diazepam is helping'  Pts daughter also stated she was talking to her mom prior to me coming in and her mom said her legs just feel better and less tight    L LE increased tone , R LE no significant increased tone  Significant atrophy to quads     Denies being too sleepy  Plex today     Walking better per PT and patient    Will follow to bio feedback of ambulation and L>R LE stiffness  Can titrate up diazepam  No oligoclonal band in chart, to confirm partial SPS or stiff leg, pt will need LP , oligo and EMG w diazepam     PT daily   Pt will do exercises in bed daily       A. [name], MD  Neuro-hospitalist  [facility]    Assessment:   66 yo F with PMHx of afib (on eliquis), HTN, HLD, DM, COPD, TIA, bipolar d/o, recent rhabdomyolysis (02/23), recent [facility] admission 3/13-01/26/22 for weakness and discharged to acute rehab 01/26/22. Pt called back to [facility] on recommendation of neurologist Dr. 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     Weakness, stiffness, difficulty ambulating, weight loss - Symptoms x 4-6 weeks. 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 No change in symptoms with diazepam.   -Workup on recent admission 3/13-4/5:  -CSF protein 52, WBC 1. ME panel, cx, HSV negative  -ENS2: +GAD65 Ab  -Ganglioside Ab panel  neg  -Motor sensory neuropathy panel neg  -MG panel neg  -RPR, HIV nonreactive  -TSH, B12, copper,  wnl              -Anaphylactic response to IVIg (IgA 435) Lips, tongue swelling with itchiness.              -3/6 EMG showing a myopathic process affecting proximal BLE              -CT chest/abd/pelvis unremarkable              -CK 239, Vit D 10. LDH 450  -MRI femur bilat WWO 3/9: Mild edema and atrophy involving the semimembranosus and biceps femoris muscles of the right and left thighs, noted to be bilateral and symmetric in appearance.       -Workup this admission:  -CK 21      Patient Active Problem List   Diagnosis    CVA (cerebral vascular accident)    Chest pain    Chest pain with high risk of acute coronary syndrome    Paroxysmal atrial fibrillation    Hypertension    Hyperlipidemia    Seizure disorder    History of gastrointestinal hemorrhage    Depression    Anemia  Asthma    Non-traumatic subcutaneous emphysema    Chest pain with moderate risk for cardiac etiology    Left-sided weakness    History of asthma    History of transient ischemic attack (TIA)    Type 2 diabetes mellitus    Syncope and collapse    Weakness of both legs    Hypokalemia    Thoracic back pain    Gastroparesis    Guillain Barr syndrome    Myositis    Stiff person syndrome       Plan:   -Medical management per primary care team              -Replacement of vit B1, vit B6, zinc per primary care team  -Trial of PLEX 3 sessions. Fri / Mon / Wed. Today is #1/3  -Valium challenge to see if leg improvement, dosing per Dr. Joyce Gross  -PT/OT  -Further recs pending neurology attending    Interval History/Subjective:   Pt denies any changes in symptoms. She c/o paresthesias to bilat feet and hands, stiffness to legs, weakness to legs. She denies pain. She denies new symptoms.     Medications:     Current Facility-Administered Medications   Medication Dose Route Frequency    apixaban  5 mg Oral Q12H SCH    budesonide-formoterol  2 puff Inhalation BID     diazePAM  5 mg Oral Q8H SCH    lurasidone  120 mg Oral QHS    metoprolol succinate XL  25 mg Oral Q12H    midodrine  10 mg Oral BID Meals    pantoprazole  40 mg Oral QAM    polyethylene glycol  17 g Oral Daily    rOPINIRole  0.5 mg Oral QHS    senna-docusate  2 tablet Oral QHS    tamsulosin  0.4 mg Oral Daily    thiamine  100 mg Oral Daily    vitamin B-6  50 mg Oral Daily    vitamin D  25 mcg Oral Daily    vitamins/minerals  1 tablet Oral Daily    zinc sulfate  220 mg Oral Daily       Review of Systems:   Neurological ROS:  negative except as noted in interval hx / subjective    Physical Exam:   Temp:  [98 F (36.7 C)-99 F (37.2 C)] 98 F (36.7 C)  Heart Rate:  [103-117] 111  Resp Rate:  [16-19] 18  BP: (106-147)/(57-84) 129/81    Vital Signs:  Reviewed    General: Well developed and well nourished. No acute distress. Cooperative with the exam  ENT: Normal oral mucosa, no ear or nose discharge  Neck: Symmetric, no deformities  CV: mild tachy  Resp: No audible wheezing, normal work of breathing  Abd: Nondistended  Skin: Intact, extremities normal in color  Psych: Affect is normal, good insight     Mental Status: The patient is awake, alert and oriented to person, place, and time.    Fund of knowledge appropriate  Recent and remote memory are intact   Attention span and concentration appear normal.  Language function is normal. There is no evidence of aphasia in conversational speech.     Cranial nerves:   -CN II: Tracking   -CN III, IV, VI: Pupils equal, round, and reactive to light; extraocular movements intact; no ptosis                                 -  CN V: Facial sensation intact in V1 through V3 distributions   -CN VII: Face symmetric   -CN VIII: Hearing intact to conversational speech   -CN IX, X: Normal phonation   -CN XI: Symmetric full strength of trapezius muscles   -CN XII: Tongue protrudes midline     Motor: Muscle tone increased in LLE. Temporalis wasting bilat.  No pronator drift.     UEs:     Deltoid Bicep Tricep Grip   Right 5 5 5 5    Left 5 5 5 5       LEs    HF KF KE PF DF   Right 2 4 5 5 5    Left 2 4 4+ 5 5      Sensory:   Light touch diminished bilat feet, L hand  Temperature diminished in RLE, bilat feet        Reflexes:        B T BR P A   Right 0 0 0 0 0   Left 0 0 0 0 0      Plantars: flexor bilat     Coordination: fine tremors not appreciated today     Gait: deferred      Labs:     Results       Procedure Component Value Units Date/Time    Fibrinogen [[id number]]  (Abnormal) Collected: 01/28/22 0441    Specimen: Blood Updated: 01/28/22 0556     Fibrinogen 420 mg/dL     Comprehensive metabolic panel [[id number]]  (Abnormal) Collected: 01/28/22 0441    Specimen: Blood Updated: 01/28/22 0556     Glucose 88 mg/dL      BUN 4.0 mg/dL      Creatinine 0.5 mg/dL      Sodium 782 mEq/L      Potassium 3.8 mEq/L      Chloride 105 mEq/L      CO2 23 mEq/L      Calcium 8.7 mg/dL      Protein, Total 5.6 g/dL      Albumin 2.5 g/dL      AST (SGOT) 15 U/L      ALT 11 U/L      Alkaline Phosphatase 64 U/L      Bilirubin, Total 0.3 mg/dL      Globulin 3.1 g/dL      Albumin/Globulin Ratio 0.8     Anion Gap 9.0    GFR [[id number]] Collected: 01/28/22 0441     Updated: 01/28/22 0556     EGFR >60.0    Magnesium [[id number]] Collected: 01/28/22 0441    Specimen: Blood Updated: 01/28/22 0556     Magnesium 1.8 mg/dL     Calcium, ionized [[id number]] Collected: 01/28/22 0441    Specimen: Blood Updated: 01/28/22 0538     Calcium, Ionized 2.51 mEq/L     CBC and differential [[id number]]  (Abnormal) Collected: 01/28/22 0441    Specimen: Blood Updated: 01/28/22 0535     WBC 7.13 x10 3/uL      Hgb 8.2 g/dL      Hematocrit 64.4 %      Platelets 364 x10 3/uL      RBC 3.14 x10 6/uL      MCV 81.8 fL      MCH 26.1 pg      MCHC 31.9 g/dL      RDW 18 %      MPV 9.1 fL      Instrument Absolute Neutrophil Count 4.38 x10 3/uL  Neutrophils 61.5 %      Lymphocytes Automated 28.3 %      Monocytes 7.0 %      Eosinophils Automated 2.4 %       Basophils Automated 0.4 %      Immature Granulocytes 0.4 %      Nucleated RBC 0.0 /100 WBC      Neutrophils Absolute 4.38 x10 3/uL      Lymphocytes Absolute Automated 2.02 x10 3/uL      Monocytes Absolute Automated 0.50 x10 3/uL      Eosinophils Absolute Automated 0.17 x10 3/uL      Basophils Absolute Automated 0.03 x10 3/uL      Immature Granulocytes Absolute 0.03 x10 3/uL      Absolute NRBC 0.00 x10 3/uL     Thyroid Peroxidase Antibody [[id number]] Collected: 01/28/22 0441    Specimen: Blood Updated: 01/28/22 0514            Rads:   No results found.      Signed by:  Sarita Haver, PA PA-C   [name]  [facility] Consult Spectra: [phone]  [facility] Consult Spectra: [phone]  Between 1630-1900, please epic chat: "[facility]"  After 1900, call: [phone]  12:29 PM  01/28/22    Please see attending Neurologist note that accompanies this mid-level encounter note.

## 2022-01-28 NOTE — Plan of Care (Signed)
NURSING PROGRESS NOTE: STROKE UNIT    Patient Name: [name] (66 y.o. female)  Admission Date: 01/26/2022 [facility] Day 2)      Recent Labs   Lab 01/28/22  0441   Sodium 137   Potassium 3.8   Chloride 105   CO2 23   BUN 4.0*   Creatinine 0.5   EGFR >60.0   Glucose 88   Calcium 8.7       Recent Labs   Lab 01/28/22  0441   WBC 7.13   Hgb 8.2*   Hematocrit 25.7*   Platelets 364*         Patient Lines/Drains/Airways Status       Active Lines, Drains and Airways       Name Placement date Placement time Site Days    Temporary Catheter - Non-Tunneled 01/27/22 Internal Jugular Right 01/27/22  1525  -- 1    Midline IV 01/27/22 Anterior;Left Upper Arm 01/27/22  1738  Upper Arm  1    Urethral Catheter Non-latex 14 Fr. 01/28/22  1428  Non-latex  less than 1                       Braden Scale Score: 17 (01/28/22 0800)      Skin Integrity: Scars  Bruising Skin Location: scattered    Wound 01/03/22 Stage 1 Coccyx;Sacrum unblanchable redness (Active)   Date First Assessed/Time First Assessed: 01/03/22 2200   Pressure Injury Staging (WOCN/ Trained RNs Only): Stage 1  Location: Coccyx;Sacrum  Wound Description (Comments): unblanchable redness  Present on Admission: Yes      Assessments 01/03/2022 11:00 PM 01/28/2022  4:00 PM   Site Description Clean;Dry;Intact Intact;Dry   Peri-wound Description Clean;Dry Clean;Dry;Intact   Closure Open to air --   Treatments Site care --   Dressing Foam --   Dressing Changed New --   Dressing Status Clean;Dry;Intact --       No associated orders.       Puncture Site 01/27/22 Venous Anterior;Right Neck (Active)   Date First Assessed/Time First Assessed: 01/27/22 1518   Puncture site location: Venous  Wound Location Orientation: Anterior;Right  Location: Neck      Assessments 01/27/2022  3:28 PM 01/28/2022  4:00 PM   Site Assessment Clean;Dry;Intact;Soft;Non Tender Clean;Dry;Intact   Dressing Status Dry;Intact Clean;Dry;Intact   Dressing Changed New --   Drainage Amount None --       No associated orders.        Safety Checklist   1:1 Sitter N    Avasys N       Interpreter Services:  Does the patient require an Interpreter? N    If yes, what form of interpreter services was used? NA     If family was utilized, is interpreter waiver form signed and in the chart? NA      ASSESSMENT/PLAN:    Last BM: 01/24/22    Pending Orders: None    Discharge Plan: SNF    POC / Family Update: None    Shift Note: Pt resting in bed.  No signs of distress.  She has been alert and oriented x 4 throughout the day.  She has no facial droop or slurred speech.  She moves all extremities although she is generally weak.  She has continued to have mild tingling in her hands and lower legs.  Legs are weaker than arms.  When ambulating pt is unsteady and tends to lean a little to the left.  Complete system assessments can be found in flowsheets.  She had her first plasmapheresis today.  She tolerated it well.  Vss.     PPE worn in patient's room:  Standard precautions.      Problem: Moderate/High Fall Risk Score >5  Goal: Patient will remain free of falls  Outcome: Progressing  Flowsheets (Taken 01/27/2022 2000 by Kyra Leyland, RN)  High (Greater than 13):   LOW-Fall Interventions Appropriate for Low Fall Risk   LOW-Anticoagulation education for injury risk   MOD-Use of assistive devices -Bedside Commode if appropriate   MOD-Utilize diversion activities   MOD-Perform dangle, stand, walk (DSW) prior to mobilization   MOD-Request PT/OT consult order for patients with gait/mobility impairment   MOD-Include family in multidisciplinary POC discussions   MOD-Place Fall Risk level on whiteboard in room   HIGH-Visual cue at entrance to patient's room   HIGH-Bed alarm on at all times while patient in bed   HIGH-Apply yellow "Fall Risk" arm band   HIGH-Initiate use of floor mats as appropriate   HIGH-Consider use of low bed     Problem: Neurological Deficit  Goal: Neurological status is stable or improving  Outcome: Progressing  Flowsheets (Taken 01/28/2022  0200 by Kyra Leyland, RN)  Neurological status is stable or improving:   Monitor/assess/document neurological assessment (Stroke: every 4 hours)   Observe for seizure activity and initiate seizure precautions if indicated   Perform CAM Assessment     Problem: Impaired Mobility  Goal: Mobility/Activity is maintained at optimal level for patient  Outcome: Progressing  Flowsheets (Taken 01/28/2022 0200 by Kyra Leyland, RN)  Mobility/activity is maintained at optimal level for patient:   Encourage independent activity per ability   Plan activities to conserve energy, plan rest periods   Perform active/passive ROM   Reposition patient every 2 hours and as needed unless able to reposition self   Maintain proper body alignment   Increase mobility as tolerated/progressive mobility   Assess for changes in respiratory status, level of consciousness and/or development of fatigue   Consult/collaborate with Physical Therapy and/or Occupational Therapy

## 2022-01-28 NOTE — OT Eval Note (Signed)
Occupational Therapy Eval [name]        Post Acute Care Therapy Recommendations:     Discharge Recommendations:  SNF    If SNF  recommended discharge disposition is not available, patient will need hands on assist for ADLs, functional mobility, transfers, safety, and HHOT.     DME needs IF patient is discharging home: No additional equipment/DME recommended at this time, Patient already has needed equipment    Therapy discharge recommendations may change with patient status.  Please refer to most recent note for up-to-date recommendations.    Assessment:   Significant Findings: none    [name] is a 66 y.o. female admitted 01/26/2022.  Patient presents with decreased ADL and mobility function 2/2 weakness, decreased endurance, decreased coordination, and decreased balance. Pt may benefit from skilled OT services to promote safety and independence during ADLs and functional activities.     Therapy Diagnosis: weakness, decreased coordination, decreased balance    Rehabilitation Potential: good for set goals    Treatment Activities: initial eval, ADLs, pt education    Educated the patient to role of occupational therapy, plan of care, goals of therapy and safety with mobility and ADLs.    Plan:   OT Frequency Recommended: 3-4x/wk     Treatment/Interventions: ADL training, functional transfer training, NMR, therapeutic exercises, patient/family education, therapeutic activities, FMC/coordination    Risks/benefits/POC discussed yes    Unit: [facility] TOWER 6 EAST  Bed: F613/F613.01        Precautions and Contraindications:   Fall  Bleeding    Consult received for [name] for OT Evaluation and Treatment.  Patient's medical condition is appropriate for Occupational Therapy intervention at this time.    History of Present Illness:    [name] is a 66 y.o. female admitted on 01/26/2022 with "hx afib on Eliquis, HTN, HLD, DM, COPD, TIAs, COPD, bipolar disorder, [facility] admit 2/7-2/17/23 for  rhabdomyolysis (peak CK 4464 - statin d/c'ed) + vomiting ?gastroparesis + starvation ketoacidosis + UTI, [facility] admit 2/18-2/20/23 for rhabdomyolysis (peak CK 2509) + transaminitis + bilateral leg weakness + right paresthesias, admit 3/4-3/13 for bilateral hand/leg weakness and paresthesias  (unclear etiology, suspect myositis s/p IVIG but had facial paresthesias so it was stopped) + leg muscle edema on MRI (no biopsy done) + non-ischemic cardiomyopathy (EF 43%) who presents to the hospital with ongoing paraparesis. She was at [facility] rehab and her hand strength improved somewhat but she is still paraparetic. She was discharged to [facility] today but his neurologist Dr. 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]    Past Medical/Surgical History:  Past Medical History:   Diagnosis Date    Anemia     Asthma     Atrial fibrillation     Chronic obstructive pulmonary disease     Convulsions     Depression     Diabetes mellitus     diet controlled     Hyperlipidemia     Hypertension     TIA (transient ischemic attack)      Past Surgical History:   Procedure Laterality Date    CARDIAC CATHETERIZATION  2017  HYSTERECTOMY      [facility]      TRIPLE LUMEN DIALYSIS CATH N/A 12/28/2021    Procedure: TRIPLE LUMEN DIALYSIS CATH;  Surgeon: Tamala Bari, MD;  Location: [facility] CARDIAC CATH;  Service: Interventional Radiology;  Laterality: N/A;     Imaging/Tests/Labs:  XR Chest AP Portable    Result Date: 01/11/2022   No active disease Kinnie Feil, MD 01/11/2022 12:12 PM    CT Angiogram Chest    Result Date: 01/01/2022  1.Limited evaluation of the subsegmental pulmonary arteries. No  evidence of pulmonary embolism given this limitation. 2.Dilated ascending aorta measuring up to 4.1 cm. Demetrios Isaacs, MD 01/01/2022 7:57 AM    MRI Femur Left W WO Contrast    Result Date: 12/30/2021  1. Mild edema and atrophy involving the semimembranosus and biceps femoris muscles of the right and left thighs, noted to be bilateral and symmetric in appearance. Mild enhancement on the postcontrast images. Very mild edema in the anterior compartment muscles of the right and left thighs, with mild enhancement. Mild atrophy of the semimembranosus and biceps femoris muscles bilaterally. These findings are nonspecific, and may be seen in the setting of muscle denervation or myositis. 2. No atrophy involving the anterior compartment muscles. 3. Mild edema in the proximal adductor longus and adductor magnus muscles bilaterally. 4. No areas of nonenhancement to suggest muscle necrosis. [name] Birks, MD 12/30/2021 3:22 PM    MRI Femur Right W WO Contrast    Result Date: 12/30/2021  1. Mild edema and atrophy involving the semimembranosus and biceps femoris muscles of the right and left thighs, noted to be bilateral and symmetric in appearance. Mild enhancement on the postcontrast images. Very mild edema in the anterior compartment muscles of the right and left thighs, with mild enhancement. Mild atrophy of the semimembranosus and biceps femoris muscles bilaterally. These findings are nonspecific, and may be seen in the setting of muscle denervation or myositis. 2. No atrophy involving the anterior compartment muscles. 3. Mild edema in the proximal adductor longus and adductor magnus muscles bilaterally. 4. No areas of nonenhancement to suggest muscle necrosis. [name] Birks, MD 12/30/2021 3:22 PM   Social History: per pt   Prior Level of Function: independent w/ ADLs and ambulation (10/2021)  Assistive Devices: SPC, FWW  Baseline Activity: community  DME Currently at Home: shower chair  Home Living Arrangements: w/ dtr and family  Type  of Home: multi level home  Home Layout: ~3 STE, FOS to basement for bed/bath, tub/shower combo    Subjective: "I must be comfortable with you"    Patient is agreeable to participation in the therapy session. Nursing clears patient for therapy.     Patient Goal: to get stronger  Pain:   Scale: 7/10  Location: "all over"  Intervention: pt repositioned, RN notified    Objective:   Patient is in bed with quinton cath in place.  Pt wore mask during therapy session:No      Cognitive Status and Neuro Exam:  A&O x person, place, situation. Pt is able to attend to tasks and follow instructions appropriately.    Musculoskeletal Examination  RUE ROM: WFL  LUE ROM: WFL  RLE ROM: WFL  LLE ROM: WFL    RUE Strength: grossly 4/5  LUE Strength: grossly 4/5  RLE Strength: 3+/5 HF, 4-/5 quad/hamstring, 5/5 DF/PF  LLE Strength: 3+/5 HF, 4-/5 quad/hamstring, 5/5 DF/PF    Coordination  Finger to Nose: RUE - overshoot w/ correction  Pad to Pad: increased  time/effort to complete  RAM: WNL    Sensory/Oculomotor Examination  Auditory: pt is able to hear therapist  Tactile: BUE - intact to LT, LLE extinct to LT along medial side of calf  Vision: EOM - WNL, Vergence - WNL, Tracking - WNL, pt wears reading glasses, pt is able to read therapist name badge and clock in room    Activities of Daily Living  Eating: independent  Grooming: supervision  Bathing: ModA, seated  UE Dressing: MinA, pt sits EOB to don gown across back  LE Dressing: ModA, pt demos tailor sit at EOB  Toileting: ModA    Functional Mobility:  Supine to Sit: SBA  Sit to Supine: SBA  Sit to Stand: CGA  Stand to Sit: CGA  Transfers: MinA, FWW, scissoring during turns    PMP Activity: Step 6 - Walks in Room      Balance  Static Sitting: good  Dynamic Sitting: good-  Static Standing: good-  Dynamic Standing: fair    Participation and Activity Tolerance  Participation Effort: good  Endurance: good-    Patient left with call bell within reach, all needs met, SCDs not in use as found,  fall mat in palce, bed alarm on, chair alarm n/a and all questions answered. RN notified of session outcome and patient response.     Goals:  Time For Goal Achievement: 7 visits  ADL Goals  Patient will groom self: Risk analyst, at sinkside, 7 visits  Patient will dress lower body: Minimal Assist, 7 visits  Pt will complete bathing: Minimal Assist, 7 visits  Patient will toilet: Minimal Assist, 7 visits  Mobility and Transfer Goals  Pt will perform functional transfers: Contact Guard Assist, 7 visits (w/ LRD)  Neuro Re-Ed Goals  Pt will perform dynamic sitting balance: Supervision, to increase ability to complete ADLs, 7 visits           Vision Goals  Pt with diplopia will complete ADLs with adaptive equipment: Discontinued (comment)      PPE worn during session: procedural mask and gloves    Tech present: N/A  PPE worn by tech: N/A    Luan Moore, OTD, OTR/L  Pager # [phone]    Time of treatment:   OT Received On: 01/28/22  Start Time: 0454  Stop Time: 0981  Time Calculation (min): 42 min

## 2022-01-29 DIAGNOSIS — R760 Raised antibody titer: Secondary | ICD-10-CM

## 2022-01-29 DIAGNOSIS — G2582 Stiff-man syndrome: Secondary | ICD-10-CM

## 2022-01-29 DIAGNOSIS — M609 Myositis, unspecified: Secondary | ICD-10-CM

## 2022-01-29 LAB — CBC AND DIFFERENTIAL
Absolute NRBC: 0 10*3/uL (ref 0.00–0.00)
Basophils Absolute Automated: 0.04 10*3/uL (ref 0.00–0.08)
Basophils Automated: 0.4 %
Eosinophils Absolute Automated: 0.28 10*3/uL (ref 0.00–0.44)
Eosinophils Automated: 3.1 %
Hematocrit: 24.9 % — ABNORMAL LOW (ref 34.7–43.7)
Hgb: 8 g/dL — ABNORMAL LOW (ref 11.4–14.8)
Immature Granulocytes Absolute: 0.04 10*3/uL (ref 0.00–0.07)
Immature Granulocytes: 0.4 %
Instrument Absolute Neutrophil Count: 6.22 10*3/uL (ref 1.10–6.33)
Lymphocytes Absolute Automated: 1.76 10*3/uL (ref 0.42–3.22)
Lymphocytes Automated: 19.8 %
MCH: 26.1 pg (ref 25.1–33.5)
MCHC: 32.1 g/dL (ref 31.5–35.8)
MCV: 81.4 fL (ref 78.0–96.0)
MPV: 9 fL (ref 8.9–12.5)
Monocytes Absolute Automated: 0.56 10*3/uL (ref 0.21–0.85)
Monocytes: 6.3 %
Neutrophils Absolute: 6.22 10*3/uL (ref 1.10–6.33)
Neutrophils: 70 %
Nucleated RBC: 0 /100 WBC (ref 0.0–0.0)
Platelets: 288 10*3/uL (ref 142–346)
RBC: 3.06 10*6/uL — ABNORMAL LOW (ref 3.90–5.10)
RDW: 18 % — ABNORMAL HIGH (ref 11–15)
WBC: 8.9 10*3/uL (ref 3.10–9.50)

## 2022-01-29 LAB — COMPREHENSIVE METABOLIC PANEL
ALT: 6 U/L (ref 0–55)
AST (SGOT): 12 U/L (ref 5–41)
Albumin/Globulin Ratio: 3.7 — ABNORMAL HIGH (ref 0.9–2.2)
Albumin: 3.7 g/dL (ref 3.5–5.0)
Alkaline Phosphatase: 32 U/L — ABNORMAL LOW (ref 37–117)
Anion Gap: 8 (ref 5.0–15.0)
BUN: <3 mg/dL — ABNORMAL LOW (ref 7.0–21.0)
Bilirubin, Total: 0.6 mg/dL (ref 0.2–1.2)
CO2: 22 mEq/L (ref 17–29)
Calcium: 8.5 mg/dL (ref 8.5–10.5)
Chloride: 109 mEq/L (ref 99–111)
Creatinine: 0.5 mg/dL (ref 0.4–1.0)
Globulin: 1 g/dL — ABNORMAL LOW (ref 2.0–3.6)
Glucose: 101 mg/dL — ABNORMAL HIGH (ref 70–100)
Potassium: 3.3 mEq/L — ABNORMAL LOW (ref 3.5–5.3)
Protein, Total: 4.7 g/dL — ABNORMAL LOW (ref 6.0–8.3)
Sodium: 139 mEq/L (ref 135–145)

## 2022-01-29 LAB — GFR: EGFR: >60

## 2022-01-29 LAB — THYROID PEROXIDASE ANTIBODY: Thyroid Peroxidase (TPO) Antibodies: 2 IU/mL (ref <9)

## 2022-01-29 NOTE — Plan of Care (Signed)
NURSING PROGRESS NOTE: STROKE UNIT    Patient Name: [name] (66 y.o. female)  Admission Date: 01/26/2022 [facility] Day 3)      Recent Labs   Lab 01/29/22  0428   Sodium 139   Potassium 3.3*   Chloride 109   CO2 22   BUN <3.0*   Creatinine 0.5   EGFR >60.0   Glucose 101*   Calcium 8.5       Recent Labs   Lab 01/29/22  0428   WBC 8.90   Hgb 8.0*   Hematocrit 24.9*   Platelets 288         Patient Lines/Drains/Airways Status       Active Lines, Drains and Airways       Name Placement date Placement time Site Days    Temporary Catheter - Non-Tunneled 01/27/22 Internal Jugular Right 01/27/22  1525  -- 2    Midline IV 01/27/22 Anterior;Left Upper Arm 01/27/22  1738  Upper Arm  1    Urethral Catheter Non-latex 14 Fr. 01/28/22  1428  Non-latex  1                       Braden Scale Score: 17 (01/29/22 0755)      Skin Integrity: Scars  Bruising Skin Location: scattered    Wound 01/03/22 Stage 1 Coccyx;Sacrum unblanchable redness (Active)   Date First Assessed/Time First Assessed: 01/03/22 2200   Pressure Injury Staging (WOCN/ Trained RNs Only): Stage 1  Location: Coccyx;Sacrum  Wound Description (Comments): unblanchable redness  Present on Admission: Yes      Assessments 01/03/2022 11:00 PM 01/29/2022  3:59 PM   Site Description Clean;Dry;Intact Intact;Dry   Peri-wound Description Clean;Dry --   Closure Open to air --   Treatments Site care --   Dressing Foam --   Dressing Changed New --   Dressing Status Clean;Dry;Intact --       No associated orders.       Puncture Site 01/27/22 Venous Anterior;Right Neck (Active)   Date First Assessed/Time First Assessed: 01/27/22 1518   Puncture site location: Venous  Wound Location Orientation: Anterior;Right  Location: Neck      Assessments 01/27/2022  3:28 PM 01/29/2022  3:59 PM   Site Assessment Clean;Dry;Intact;Soft;Non Tender Clean;Dry;Intact   Dressing Status Dry;Intact Clean;Dry;Intact   Dressing Changed New --   Drainage Amount None --       No associated orders.       Safety Checklist    1:1 Sitter N    Avasys N       Interpreter Services:  Does the patient require an Interpreter? N    If yes, what form of interpreter services was used? NA     If family was utilized, is interpreter waiver form signed and in the chart? NA      ASSESSMENT/PLAN:    Last BM: 01/29/22    Pending Orders: None    Discharge Plan: SNF    POC / Family Update: None    Shift Note: Pt resting in bed.  No signs of distress.  She has been alert and oriented x 4 throughout the day.  She continues to have tingling in her hands and lower legs.  Weakness remains to both legs as well.  Remaining neuro assessment is wnl.  She has ambulated in hallway with therapy.  She continues to lean a little to the left and coordination is diminished slightly.  Complete system assessments can be found in  flowsheets.  Vss.      PPE worn in patient's room:  Standard precautions      Problem: Moderate/High Fall Risk Score >5  Goal: Patient will remain free of falls  Outcome: Progressing  Flowsheets (Taken 01/27/2022 2000 by Kyra LeylandBennett, Josephine, RN)  High (Greater than 13):   LOW-Fall Interventions Appropriate for Low Fall Risk   LOW-Anticoagulation education for injury risk   MOD-Use of assistive devices -Bedside Commode if appropriate   MOD-Utilize diversion activities   MOD-Perform dangle, stand, walk (DSW) prior to mobilization   MOD-Request PT/OT consult order for patients with gait/mobility impairment   MOD-Include family in multidisciplinary POC discussions   MOD-Place Fall Risk level on whiteboard in room   HIGH-Visual cue at entrance to patient's room   HIGH-Bed alarm on at all times while patient in bed   HIGH-Apply yellow "Fall Risk" arm band   HIGH-Initiate use of floor mats as appropriate   HIGH-Consider use of low bed     Problem: Neurological Deficit  Goal: Neurological status is stable or improving  Outcome: Progressing  Flowsheets (Taken 01/29/2022 0121 by Louie BostonBalderrama Muyba, Marcelo, RN)  Neurological status is stable or improving:    Monitor/assess/document neurological assessment (Stroke: every 4 hours)   Perform CAM Assessment     Problem: Impaired Mobility  Goal: Mobility/Activity is maintained at optimal level for patient  Outcome: Progressing  Flowsheets (Taken 01/28/2022 0200 by Kyra LeylandBennett, Josephine, RN)  Mobility/activity is maintained at optimal level for patient:   Encourage independent activity per ability   Plan activities to conserve energy, plan rest periods   Perform active/passive ROM   Reposition patient every 2 hours and as needed unless able to reposition self   Maintain proper body alignment   Increase mobility as tolerated/progressive mobility   Assess for changes in respiratory status, level of consciousness and/or development of fatigue   Consult/collaborate with Physical Therapy and/or Occupational Therapy

## 2022-01-29 NOTE — OT Progress Note (Signed)
Occupational Therapy Treatment  [name]        Post Acute Care Therapy Recommendations:     Discharge Recommendations:  SNF    If SNF  recommended discharge disposition is not available, patient will need hands on assist for ADLs, functional mobility, transfers, and HHOT.     DME needs IF patient is discharging home: No additional equipment/DME recommended at this time, Patient already has needed equipment    Therapy discharge recommendations may change with patient status.  Please refer to most recent note for up-to-date recommendations.    Assessment:   Significant Findings: session limited at pt HR elevated to 120s at rest. Highest HR noted at 138 with standing. RN notified.    Pt encountered semi supine in bed, agreeable to OT session. Pt participates in bed mobility, UB dressing, LB dressing, STS transfers, and pt education - see below for details. Pt voices that she's "having a bad day" and reports struggling mentally. Pt informed of music therapy and animal assisted therapy services and verbalizes interest - RN and MD notified. Pt w/ slight improvement in LE stiffness (LLE cont to be slightly stiffer than RLE). Pt reports dropping items t/o morning and b/l grip is weaker on assessment (3/5 MMT) compared to evaluation. Pt cont to demo weakness, decreased balance, decreased endurance, decreased activity tolerance, and decreased coordination. Recommend cont' OT services to promote safety and independence during ADLs and functional activities.    Treatment Activities: ADLs, bed mobility, pt education    Educated the patient to role of occupational therapy, plan of care, goals of therapy and safety with mobility and ADLs.    Plan:    OT Frequency Recommended: 3-4x/wk     Continue plan of care.    Unit: [facility] TOWER 6 EAST  Bed: F613/F613.01       Precautions and Contraindications:   Fall  Bleeding  Monitor HR    Updated Medical Status/Imaging/Labs:  reviewed    Subjective: "I'm having a  bad morning, I keep dropping things"   Patient's medical condition is appropriate for Occupational Therapy intervention at this time.  Patient is agreeable to participation in the therapy session. Nursing clears patient for therapy.    Pain:   Scale: 0 [name] faces  Location: N/A  Intervention: N/A    Objective:   Patient is in bed with indwelling urinary catheter and quinton cath in place.  Pt wore mask during therapy session:No      Cognition  Pt follows instructions and attends to tasks appropriately    Functional Mobility  Scooting: SBA  Supine to Sit: SBA, HOB elevated  Sit to Supine: SBA, HOB flat  Sit to Stand: MinA  Stand to Sit: MinA  Transfers: Min/ModA, FWW, side step along EOB    PMP Activity: Step 4 - Dangle at Bedside (STSx2)    Balance  Static Sitting: good  Dynamic Sitting: NT  Static Standing: good-  Dynamic Standing: fair    Self Care and Home Management  UE Dressing: MinA, pt sits EOB to don t-shirt  LE Dressing: ModA, pt sits EOB to don pants, assist for foley and pull to mid thigh    Therapeutic Exercises  W/ activity    Participation: good  Endurance: fair+    Patient left with call bell within reach, all needs met, SCDs not in use as found, fall mat in place, bed alarm on, chair alarm n/a and all questions answered. RN notified of session outcome  and patient response.     Goals:  Time For Goal Achievement: 7 visits  ADL Goals  Patient will groom self: Risk analyst, at sinkside, 7 visits  Patient will dress lower body: Minimal Assist, 7 visits  Pt will complete bathing: Minimal Assist, 7 visits  Patient will toilet: Minimal Assist, 7 visits  Mobility and Transfer Goals  Pt will perform functional transfers: Contact Guard Assist, 7 visits (w/ LRD)  Neuro Re-Ed Goals  Pt will perform dynamic sitting balance: Supervision, to increase ability to complete ADLs, 7 visits           Vision Goals  Pt with diplopia will complete ADLs with adaptive equipment: Discontinued (comment)      PPE  worn during session: procedural mask and gloves    Tech present: N/A  PPE worn by tech: N/A    Luan Moore, OTD, OTR/L  Pager # [phone]    Time of Treatment  OT Received On: 01/29/22  Start Time: 0454  Stop Time: 0919  Time Calculation (min): 44 min    Treatment # 1 of 7 visits

## 2022-01-29 NOTE — Progress Notes (Signed)
[facility] Neurology Consultation Progress Note    Date Time: 01/29/22 11:59 AM  Patient Name: [facility]  Outpatient Neurologist :    CC: LE weakness, paresthesia      Assessment:   66 yo F with PMHx of afib (on eliquis), HTN, HLD, DM, COPD, TIA, bipolar d/o, recent rhabdomyolysis (02/23), recent [facility] admission 3/13-01/26/22 for weakness and discharged to acute rehab 01/26/22. Pt called back to [facility] on recommendation of neurologist Dr. 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     Weakness, stiffness, difficulty ambulating, weight loss - Symptoms x 4-6 weeks. 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 No change in symptoms with diazepam.   -Workup on recent admission 3/13-4/5:  -CSF protein 52, WBC 1. ME panel, cx, HSV negative  -ENS2: +GAD65 Ab  -Ganglioside Ab panel neg  -Motor sensory neuropathy panel neg  -MG panel neg  -RPR, HIV nonreactive  -TSH, B12, copper,  wnl              -Anaphylactic response to IVIg (IgA 435) Lips, tongue swelling with itchiness.              -3/6 EMG showing a myopathic process affecting proximal BLE              -CT chest/abd/pelvis unremarkable              -CK 239, Vit D 10. LDH 450  -MRI femur bilat WWO 3/9: Mild edema and atrophy involving the semimembranosus and biceps femoris muscles of the right and left thighs, noted to be bilateral and symmetric in appearance.       -Workup this admission:  -CK 21  Patient Active Problem List   Diagnosis    CVA (cerebral vascular accident)    Chest pain    Chest pain with high risk of acute coronary syndrome    Paroxysmal atrial fibrillation    Hypertension    Hyperlipidemia    Seizure disorder    History of gastrointestinal hemorrhage    Depression    Anemia    Asthma    Non-traumatic subcutaneous emphysema    Chest pain with moderate risk for cardiac etiology    Left-sided weakness    History of asthma    History of transient ischemic attack (TIA)    Type 2 diabetes  mellitus    Syncope and collapse    Weakness of both legs    Hypokalemia    Thoracic back pain    Gastroparesis    Guillain Barr syndrome    Myositis    Stiff person syndrome       Plan:   -Trial of PLEX x 3 sessions, Fri / Mon / Wed  -valium challenge for symptomatic treatment of LE stiffness  -supportive measures.     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 Ongoing trial of PLEX (first session completed on 4/7). In the future low threshold for muscle biopsy (rectus femoris recommended based on EMG results).     I performed the substantive portion of this visit by providing more than 50% of the total time and spent 30 minutes. Patient was also seen by APP who spent 20 minutes. I agree with the findings and plan as outlined, with any highlights or additions as noted above.    A total of 50 minutes were spent face-to-face with the  patient for which >50% was spent counseling and coordinating care.      Angelia Mould, MD  ___________________________________      Interval History/Subjective:   No new events overnight. Continues to c/o b/l feet and hand paresthesia. Denies new focal motor weakness, headache, vision/speech changes.     Medications:     Current Facility-Administered Medications   Medication Dose Route Frequency    apixaban  5 mg Oral Q12H SCH    budesonide-formoterol  2 puff Inhalation BID    diazePAM  5 mg Oral Q8H SCH    lurasidone  120 mg Oral QHS    metoprolol succinate XL  25 mg Oral Q12H    midodrine  10 mg Oral BID Meals    pantoprazole  40 mg Oral QAM    polyethylene glycol  17 g Oral Daily    rOPINIRole  0.5 mg Oral QHS    senna-docusate  2 tablet Oral QHS    tamsulosin  0.4 mg Oral Daily    thiamine  100 mg Oral Daily    vitamin B-6  50 mg Oral Daily    vitamin D  25 mcg Oral Daily    vitamins/minerals  1 tablet Oral Daily    zinc sulfate  220 mg Oral Daily        Review of Systems:   Neurological ROS: negative for - confusion, dizziness, headaches, or tremors    Physical Exam:   Temp:  [97.9 F (36.6 C)-98.9 F (37.2 C)] 97.9 F (36.6 C)  Heart Rate:  [70-123] 117  Resp Rate:  [15-18] 17  BP: (122-147)/(67-88) 123/74    Vital Signs:  Reviewed    General: Well developed and well nourished. No acute distress. Cooperative with the exam  ENT: Normal oral mucosa, no ear or nose discharge  Neck: Symmetric, no deformities  CV: RRR  Resp: No audible wheezing, normal work of breathing  Abd: Soft, nondistended  Skin: Intact, extremities normal in color  Psych: Affect is normal, good insight    Mental Status: The patient is awake, alert and oriented to person, place, and time.  Affect is normal  Fund of knowledge appropriate  Attention span and concentration appear normal.  Language function is normal. There is no evidence of aphasia in conversational speech.    Cranial nerves:   -CN II: tracking  -CN III, IV, VI: Pupils equal, round, and reactive to light; extraocular movements intact; no ptosis              -CN V: Facial sensation intact in V1 through V3 distributions   -CN VII: Face symmetric   -CN VIII: Hearing intact to conversational speech   -CN IX, X: Palate elevates symmetrically; normal phonation   -CN XI: Symmetric full strength of sternocleidomastoid and trapezius muscles   -CN XII: Tongue protrudes midline    Motor: Muscle tone normal without spasticity or flaccidity. No atrophy.      UEs:   Deltoid Bicep Tricep WE WF Grip IO   Right 5 5 5   5     Left 5 5 5   5       LEs   HF HE KF KE PF DF   Right 2 2 4 5 5 5    Left 2 2 4  4+ 5 5     Sensory:   LT and Temp diminished b/l hand, feet    Reflexes:      B T BR P A   Right 0 0 0  0 0   Left 0 0 0 0 0     Plantars: flexor b/l    Coordination: No tremors    Gait: deferred      Labs:     Results       Procedure Component Value Units Date/Time    Thyroid Peroxidase Antibody [[id number]] Collected: 01/28/22 0441    Specimen:  Blood Updated: 01/29/22 0624     Thyroid Peroxidase (TPO) Antibodies 2 IU/mL     Comprehensive metabolic panel [[id number]]  (Abnormal) Collected: 01/29/22 0428    Specimen: Blood Updated: 01/29/22 0530     Glucose 101 mg/dL      BUN <7.8 mg/dL      Creatinine 0.5 mg/dL      Sodium 295 mEq/L      Potassium 3.3 mEq/L      Chloride 109 mEq/L      CO2 22 mEq/L      Calcium 8.5 mg/dL      Protein, Total 4.7 g/dL      Albumin 3.7 g/dL      AST (SGOT) 12 U/L      ALT 6 U/L      Alkaline Phosphatase 32 U/L      Bilirubin, Total 0.6 mg/dL      Globulin 1.0 g/dL      Albumin/Globulin Ratio 3.7     Anion Gap 8.0    GFR [[id number]] Collected: 01/29/22 0428     Updated: 01/29/22 0530     EGFR >60.0    CBC and differential [[id number]]  (Abnormal) Collected: 01/29/22 0428    Specimen: Blood Updated: 01/29/22 0506     WBC 8.90 x10 3/uL      Hgb 8.0 g/dL      Hematocrit 84.1 %      Platelets 288 x10 3/uL      RBC 3.06 x10 6/uL      MCV 81.4 fL      MCH 26.1 pg      MCHC 32.1 g/dL      RDW 18 %      MPV 9.0 fL      Instrument Absolute Neutrophil Count 6.22 x10 3/uL      Neutrophils 70.0 %      Lymphocytes Automated 19.8 %      Monocytes 6.3 %      Eosinophils Automated 3.1 %      Basophils Automated 0.4 %      Immature Granulocytes 0.4 %      Nucleated RBC 0.0 /100 WBC      Neutrophils Absolute 6.22 x10 3/uL      Lymphocytes Absolute Automated 1.76 x10 3/uL      Monocytes Absolute Automated 0.56 x10 3/uL      Eosinophils Absolute Automated 0.28 x10 3/uL      Basophils Absolute Automated 0.04 x10 3/uL      Immature Granulocytes Absolute 0.04 x10 3/uL      Absolute NRBC 0.00 x10 3/uL     Urinalysis Reflex to Microscopic Exam- Reflex to Culture [[id number]]  (Abnormal) Collected: 01/28/22 1424     Updated: 01/28/22 1523     Urine Type Urine, Clean Ca     Color, UA Straw     Clarity, UA Clear     Specific Gravity UA 1.006     Urine pH 5.5     Leukocyte Esterase, UA Moderate     Nitrite, UA Negative     Protein, UR Negative     Glucose,  UA  Negative     Ketones UA Negative     Urobilinogen, UA Normal mg/dL      Bilirubin, UA Negative     Blood, UA Small     RBC, UA 3 - 5 /hpf      WBC, UA 11-25 /hpf      Squamous Epithelial Cells, Urine 0-5 /hpf      Urine Mucus Present            Rads:   Non-Tunneled Cath Placement [facility])    Result Date: 01/28/2022  Right internal jugular approach non-tunneled (Quinton) central venous catheter placement. PLAN: Catheter is ready for use. Verline Lema MD 01/28/2022 11:27 AM       Signed by:  [facility]  [facility] Consult Spectra: [phone]  Between [phone], please epic chat: "[facility] General Neurology"  After 1900, call: [phone]  Please see attending neurology note that follows this mid-level encounter note.

## 2022-01-29 NOTE — Progress Notes (Addendum)
[facility] HOSPITALIST PROGRESS NOTE    Date Time: 01/29/22 12:11 PM  Patient Name: [name],[name]  Attending Physician: Les Pou, MD        History of Presenting Illness and Interval History/24 hour Events:   HPI per Admitting Provider  " [name] is a 66 y.o. female hx afib on Eliquis, HTN, HLD, DM, COPD, TIAs, COPD, bipolar disorder, [facility] admit 2/7-2/17/23 for rhabdomyolysis (peak CK 4464 - statin d/c'ed) + vomiting ?gastroparesis + starvation ketoacidosis + UTI, [facility] admit 2/18-2/20/23 for rhabdomyolysis (peak CK 2509) + transaminitis + bilateral leg weakness + right paresthesias, admit 3/4-3/13 for bilateral hand/leg weakness and paresthesias  (unclear etiology, suspect myositis s/p IVIG but had facial paresthesias so it was stopped) + leg muscle edema on MRI (no biopsy done) + non-ischemic cardiomyopathy (EF 43%) who presents to the hospital with ongoing paraparesis. She was at [facility] rehab and her hand strength improved somewhat but she is still paraparetic. She was discharged to [facility] today but his neurologist Dr. 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 These symptoms are sudden onset, severe intensity, without alleviating factors.  "    Subjective 4/6: NAE. Patient reports no new focal complaints. Continues to show improvement with weakens, but sill reports limb stiffness.      Subjective 4/7: NAE. Patient reports improved mobility today after starting valium yesterday.    Subjective 4/8: NAE. Patient without focal complaints, tolerated PLEX yesterday  Physical Exam:     Vitals:    01/29/22 1100   BP: 123/74   Pulse: (!) 117   Resp: 17   Temp: 97.9 F (36.6 C)   SpO2: 97%       Intake and Output Summary (Last 24 hours) at Date Time    Intake/Output Summary (Last 24 hours) at  01/29/2022 1211  Last data filed at 01/29/2022 0900  Gross per 24 hour   Intake --   Output 850 ml   Net -850 ml         General: awake, alert, oriented x 3; no acute distress.  HEENT: NC/AT.  MMM  Neck: supple  Cardiovascular: regular rate and rhythm, no murmurs, rubs or gallops  Lungs: clear to auscultation bilaterally, without wheezing, rhonchi, or rales  Abdomen: soft, non-tender, non-distended; normoactive bowel sounds, no rebound or guarding  Extremities: increased tone LLE>RLE.    Neuro: cranial nerves grossly intact, strength 5/5 in upper and 3/5 lower extremities, sensation intact except paraesthesias in hands/feet  Skin: no rashes or lesions noted      Medications:     Current Facility-Administered Medications   Medication Dose Route Frequency    apixaban  5 mg Oral Q12H SCH    budesonide-formoterol  2 puff Inhalation BID    diazePAM  5 mg Oral Q8H SCH    lurasidone  120 mg Oral QHS    metoprolol succinate XL  25 mg Oral Q12H    midodrine  10 mg Oral BID Meals    pantoprazole  40 mg Oral QAM    polyethylene glycol  17 g Oral Daily    rOPINIRole  0.5 mg Oral QHS    senna-docusate  2 tablet Oral QHS    tamsulosin  0.4 mg Oral Daily    thiamine  100 mg Oral Daily  vitamin B-6  50 mg Oral Daily    vitamin D  25 mcg Oral Daily    vitamins/minerals  1 tablet Oral Daily    zinc sulfate  220 mg Oral Daily         Labs:     Results       Procedure Component Value Units Date/Time    Thyroid Peroxidase Antibody [[id number]] Collected: 01/28/22 0441    Specimen: Blood Updated: 01/29/22 0624     Thyroid Peroxidase (TPO) Antibodies 2 IU/mL     Comprehensive metabolic panel [[id number]]  (Abnormal) Collected: 01/29/22 0428    Specimen: Blood Updated: 01/29/22 0530     Glucose 101 mg/dL      BUN <1.0 mg/dL      Creatinine 0.5 mg/dL      Sodium 272 mEq/L      Potassium 3.3 mEq/L      Chloride 109 mEq/L      CO2 22 mEq/L      Calcium 8.5 mg/dL      Protein, Total 4.7 g/dL      Albumin 3.7 g/dL      AST (SGOT) 12 U/L      ALT 6  U/L      Alkaline Phosphatase 32 U/L      Bilirubin, Total 0.6 mg/dL      Globulin 1.0 g/dL      Albumin/Globulin Ratio 3.7     Anion Gap 8.0    GFR [[id number]] Collected: 01/29/22 0428     Updated: 01/29/22 0530     EGFR >60.0    CBC and differential [[id number]]  (Abnormal) Collected: 01/29/22 0428    Specimen: Blood Updated: 01/29/22 0506     WBC 8.90 x10 3/uL      Hgb 8.0 g/dL      Hematocrit 75.6 %      Platelets 288 x10 3/uL      RBC 3.06 x10 6/uL      MCV 81.4 fL      MCH 26.1 pg      MCHC 32.1 g/dL      RDW 18 %      MPV 9.0 fL      Instrument Absolute Neutrophil Count 6.22 x10 3/uL      Neutrophils 70.0 %      Lymphocytes Automated 19.8 %      Monocytes 6.3 %      Eosinophils Automated 3.1 %      Basophils Automated 0.4 %      Immature Granulocytes 0.4 %      Nucleated RBC 0.0 /100 WBC      Neutrophils Absolute 6.22 x10 3/uL      Lymphocytes Absolute Automated 1.76 x10 3/uL      Monocytes Absolute Automated 0.56 x10 3/uL      Eosinophils Absolute Automated 0.28 x10 3/uL      Basophils Absolute Automated 0.04 x10 3/uL      Immature Granulocytes Absolute 0.04 x10 3/uL      Absolute NRBC 0.00 x10 3/uL     Urinalysis Reflex to Microscopic Exam- Reflex to Culture [[id number]]  (Abnormal) Collected: 01/28/22 1424     Updated: 01/28/22 1523     Urine Type Urine, Clean Ca     Color, UA Straw     Clarity, UA Clear     Specific Gravity UA 1.006     Urine pH 5.5     Leukocyte Esterase, UA Moderate     Nitrite, UA Negative  Protein, UR Negative     Glucose, UA Negative     Ketones UA Negative     Urobilinogen, UA Normal mg/dL      Bilirubin, UA Negative     Blood, UA Small     RBC, UA 3 - 5 /hpf      WBC, UA 11-25 /hpf      Squamous Epithelial Cells, Urine 0-5 /hpf      Urine Mucus Present                  @A1c @    Radiology:     Radiology Results (24 Hour)       Procedure Component Value Units Date/Time    Non-Tunneled Cath Placement [facility]) [[id number]] Collected: 01/28/22 1123    Order Status: Completed Updated:  01/28/22 1352    Narrative:      PROCEDURE: Right internal jugular approach nontunneled central venous  catheter placement.    HISTORY: Quinton placement requested.    OPERATOR(S):  Attending VIR Physician(s): Verline Lema, M.D.  Advanced Practice Provider: None    CONTRAST:  Contrast agent: None  Contrast volume (mL): 0    RADIATION PARAMETERS:  Fluoroscopy time (mm:ss): 00:30.1    Reference air kerma (mGy): 4.154     TECHNIQUE/FINDINGS: Informed consent for the procedure including risks,  benefits and alternatives was obtained. A safety timeout was performed  and documented with all members of the procedure team present, including  verification of patient identification, correct procedure, procedure  site, and laterality. The patient was positioned supine.     Preparation: The site was prepared and draped using all elements of  maximal sterile barrier technique including sterile gloves, sterile  gown, cap, mask, large sterile sheet, sterile ultrasound probe cover,  hand hygiene and cutaneous antisepsis with 2% chlorhexidine.  Medical reason for site preparation exception: Not applicable.    Sedation: None.    All indicated images were saved to PACS.    Using ultrasound guidance the patient was evaluated for an appropriate  access site. Demonstrating patency, the right internal jugular vein was  selected. The vein was entered using a micropuncture needle utilizing  realtime ultrasound visualization. A microwire was passed and a 5 [place]  introducer sheath was placed. Image documentation of venous access and  final catheter placement is maintained in the patient medical record. A  0.035 inch wire was used to access the inferior vena cava. Serial  dilatations were performed. Fluoroscopic guidance was then used to place  the catheter in the upper right right atrium.     Sterile dressing was applied. Patient tolerated the procedure well.      Impression:        Right internal jugular approach non-tunneled (Quinton)  central venous  catheter placement.    PLAN:  Catheter is ready for use.      Verline Lema MD  01/28/2022 11:27 AM            Lines  Patient Lines/Drains/Airways Status       Active PICC Line / CVC Line / PIV Line / Drain / Airway / Intraosseous Line / Epidural Line / ART Line / Line / Wound / Pressure Ulcer / NG/OG Tube       Name Placement date Placement time Site Days    Urethral Catheter Non-latex;Straight-tip 16 Fr. 01/23/22  1457  Non-latex;Straight-tip  3    Wound 01/03/22 Stage 1 Coccyx;Sacrum unblanchable redness 01/03/22  2200  Coccyx;Sacrum  23  Urethral Catheter Non-latex;Straight-tip 16 Fr. (Active)   Catheter necessity reviewed? Yes 01/27/22 1100   Site Assessment WDL 01/27/22 0800   Pericare (With Urinary Catheter) Yes 01/27/22 1100   Collection Container Standard drainage bag with urimeter 01/27/22 1100   Securement Method Securement device 01/27/22 1100   Reason for Continuing Urinary Catheterization past POD 1 Acute urinary retention due to nerve injury 01/27/22 0800   Positioned catheter tubing for unobstructed urine flow: Yes 01/27/22 1100   Urine Output (mL) 300 mL 01/26/22 1500   Urine Output (mL) Total 300 mL 01/26/22 1500   Number of days: 3         Assessment:     Patient Active Problem List   Diagnosis    CVA (cerebral vascular accident)    Chest pain    Chest pain with high risk of acute coronary syndrome    Paroxysmal atrial fibrillation    Hypertension    Hyperlipidemia    Seizure disorder    History of gastrointestinal hemorrhage    Depression    Anemia    Asthma    Non-traumatic subcutaneous emphysema    Chest pain with moderate risk for cardiac etiology    Left-sided weakness    History of asthma    History of transient ischemic attack (TIA)    Type 2 diabetes mellitus    Syncope and collapse    Weakness of both legs    Hypokalemia    Thoracic back pain    Gastroparesis    Guillain Barr syndrome    Myositis    Stiff person syndrome     66 y.o. female hx afib on  Eliquis, HTN, HLD, DM, COPD, TIAs, COPD, bipolar disorder, [facility] admit 2/7-2/17/23 for rhabdomyolysis (peak CK 4464 - statin d/c'ed) + vomiting ?gastroparesis + starvation ketoacidosis + UTI, [facility] admit 2/18-2/20/23 for rhabdomyolysis (peak CK 2509) + transaminitis + bilateral leg weakness + right paresthesias, admit 3/4-3/13 for bilateral hand/leg weakness and paresthesias  (unclear etiology, suspect myositis s/p IVIG but had facial paresthesias so it was stopped) + leg muscle edema on MRI (no biopsy done) + non-ischemic cardiomyopathy (EF 43%) who presents with ongoing paraparesis.     Plan:   #Neuro: Weakness, Stiffness, Ambulatory Dysfunction for 6 weeks.  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 Hand weakness appears improved but paraparesis persists.  -Neurology following  -s/p Quinton Catheter.  S/p PLEX #1 of 3 (F/M/W)  -Benzodiazepine trial. Cont Valium 5mg  q8h.   -Routine Cancer screening as an outpatient.  -TPO Ab  pending  -Quinton catheter.   -Neuro checks.   -PT/OT.     #Anemia of chronic disease  -Iron profile reviewed, B12 within normal limits  -Hemoglobin remains stable on Eliquis    UA likely colonization, no signs/symptoms c/f UTI    Maintain Foley catheter for retetnion (4/7). Failed multiple voiding trials     Hx afib on Eliquis, HTN, HLD, DM, COPD, TIAs, COPD, bipolar disorder, [facility] admit 2/7-2/17/23 for rhabdomyolysis (peak CK 4464 - statin d/c'ed) + vomiting ?gastroparesis + starvation ketoacidosis + UTI, [facility] admit 2/18-2/20/23 for rhabdomyolysis (peak CK 2509) + transaminitis + bilateral leg weakness + right paresthesias, admit 3/4-3/13 for bilateral hand/leg weakness and paresthesias  (unclear etiology, suspect myositis s/p IVIG but had facial paresthesias so it was stopped) + leg muscle edema on MRI (no biopsy done) + non-ischemic cardiomyopathy (EF 43%)      DVT/GI prophylaxis - SCDs. Eliquis BID  Recent Labs     01/29/22  0428  01/28/22  0441 01/27/22  0423 01/26/22  2236   Potassium 3.3* 3.8 3.2* 3.8       Diagnosis: Hypokalemia        Signed by: Les Pou, MD, MD, MBA  cc:Malcolm Metro, MD

## 2022-01-29 NOTE — Plan of Care (Signed)
Problem: Compromised Tissue integrity  Goal: Damaged tissue is healing and protected  Flowsheets (Taken 01/29/2022 0121)  Damaged tissue is healing and protected:   Monitor/assess Braden scale every shift   Provide wound care per wound care algorithm   Increase activity as tolerated/progressive mobility   Avoid shearing injuries   Relieve pressure to bony prominences for patients at moderate and high risk   Use bath wipes, not soap and water, for daily bathing   Keep intact skin clean and dry   Use incontinence wipes for cleaning urine, stool and caustic drainage. Foley care as needed   Monitor external devices/tubes for correct placement to prevent pressure, friction and shearing   Encourage use of lotion/moisturizer on skin   Monitor patient's hygiene practices     Problem: Neurological Deficit  Goal: Neurological status is stable or improving  Flowsheets (Taken 01/29/2022 0121)  Neurological status is stable or improving:   Monitor/assess/document neurological assessment (Stroke: every 4 hours)   Perform CAM Assessment   Neuro:  A0x4. Able to follow commands. PERRLA. Tinglings on hands present. 4/5 strength on left upper and lower extremity  Cardio:  no tele  Respiratory:  RA, sating @ 98%  Pain: No pain reported during this shift   Integumentary: Non-pitting edema on BLE   GI/GU: Foley in place. LBM -PTA per patient report  Diet: Regular   Activity: SBA with walker  LDA: Midline LUE, RIJ Quinton catheter  Critical Labs/Imaging/Procedures:     Comments: Bed alarm on and bed locked in the lowest position. Call bell and belongings within reach. Fall precautions maintained.     Plan:   PLEX x3.   Continue with benzo trial.  Neuro checks Q4/hrs.  PT/OT    Vitals:    01/28/22 1530 01/28/22 1856 01/28/22 2051 01/28/22 2300   BP: 127/77 130/82 131/79 122/75   Pulse: 70 (!) 113 (!) 114 (!) 113   Resp: 18 18  15    Temp: 98.4 F (36.9 C) 98.9 F (37.2 C)  98.3 F (36.8 C)   TempSrc: Axillary Oral  Oral   SpO2: 98% 98%   98%   Weight:       Height:           Patient Lines/Drains/Airways Status       Active Lines, Drains and Airways       Name Placement date Placement time Site Days    Temporary Catheter - Non-Tunneled 01/27/22 Internal Jugular Right 01/27/22  1525  -- 1    Midline IV 01/27/22 Anterior;Left Upper Arm 01/27/22  1738  Upper Arm  1    Urethral Catheter Non-latex 14 Fr. 01/28/22  1428  Non-latex  less than 1

## 2022-01-29 NOTE — PT Progress Note (Signed)
Physical Therapy Treatment  [name]   Post Acute Care Therapy Recommendations:   Discharge Recommendations: SNF    If SNF recommended discharge disposition is not available, patient will need hands on assist for mobility and ADLs and HHPT.     DME needs IF patient discharges home: Front wheel walker, Wheelchair-manual, [facility]    Therapy discharge recommendations may change with patient status.  Please refer to most recent note for up-to-date recommendations.  Assessment:   Significant Findings: none    Pt approached for skilled PT tx with RN consent, received semisupine in bed, agreeable to participate. Pt reports LE stiffness/weakness has improved since admission. Requires CGA for sit<>stands. Amb 66ft with min A and RW. Slow, short gait with NBOS. No scissoring noted throughout, tolerated turns without difficulty or LOB. Noted knee "bouncing" while walking, no buckling noted. Fatigues quickly. Pt will continue to benefit from skilled PT services to address mobility impairments in order to maximize functional independence.      Assessment: Decreased LE strength, Decreased endurance/activity tolerance, Decreased functional mobility, Decreased balance, Gait impairment  Progress: Progressing toward goals  Prognosis: Good, With continued PT status post acute discharge  Risks/Benefits/POC Discussed with Pt/Family: With patient  Patient left without needs and call bell within reach. RN notified of session outcome.     Treatment Activities: gait training, therex, pt education    Educated the patient to role of physical therapy, plan of care, goals of therapy and safety with mobility and ADLs.    Plan:   Treatment/Interventions: Exercise, Gait training, Neuromuscular re-education, Functional transfer training, LE strengthening/ROM, Endurance training, Patient/family training, Equipment eval/education, Compensatory technique education        PT Frequency: 3-4x/wk     Continue plan of care.     Unit: [facility] TOWER 6 EAST  Bed: F613/F613.01    Precautions and Contraindications:   Precautions  Weight Bearing Status: no restrictions  Other Precautions: falls    Updated Medical Status/Imaging/Labs: reviewed    Subjective: "This feels alright."   Patient Goal: to get better    Pain Assessment  Pain Assessment: No/denies pain    Patient's medical condition is appropriate for Physical Therapy intervention at this time.  Patient is agreeable to participation in the therapy session. Nursing clears patient for therapy.    Objective:   Observation of Patient/Vital Signs:  Patient is in bed with peripheral IV, Quinton catheter in place.  Pt wore mask during therapy session:No      Cognition/Neuro Status  Arousal/Alertness: Appropriate responses to stimuli  Attention Span: Appears intact  Orientation Level: Oriented X4  Following Commands: Follows all commands and directions without difficulty  Behavior: calm;cooperative  Motor Planning: intact  Coordination: intact         Functional Mobility:  Supine to Sit: Stand by Assist  Scooting to EOB: Stand by Assist  Sit to Supine: Minimal Assist (for LE assist)  Sit to Stand: Contact Guard Assist  Stand to Sit: Contact Guard Assist       Ambulation:  PMP - Progressive Mobility Protocol   PMP Activity: Step 7 - Walks out of Room  Distance Walked (ft) (Step 6,7): 50 Feet     Ambulation: Minimal Assist;with front-wheeled walker  Pattern: decreased cadence;decreased step length;Narrow BOS (knee bouncing, no buckling)       Patient Participation: good  Patient Endurance: fair    Patient left with call bell within reach, all needs met, SCDs off, fall mat in place, bed  alarm on and all questions answered. RN notified of session outcome and patient response.     Goals:  Goals  Goal Formulation: With patient  Time for Goal Acheivement: 7 visits  Pt Will Perform Sit to Stand: with contact guard assist  Pt Will Transfer Bed/Chair: with rolling walker, with contact guard assist  Pt Will  Ambulate: 31-50 feet, with rolling walker, with contact guard assist  Pt Will Propel Wheelchair: > 150 feet, independent    PPE worn during session: procedural mask and gloves    Tech present: none  PPE worn by tech: N/A    Leavy Cella, PT, DPT Pager # [phone] 01/29/2022 4:28 PM       Time of Treatment  PT Received On: 01/29/22  Start Time: 1445  Stop Time: 1510  Time Calculation (min): 25 min  Treatment # 1 out of 7 visits

## 2022-01-30 LAB — CBC AND DIFFERENTIAL
Absolute NRBC: 0 10*3/uL (ref 0.00–0.00)
Basophils Absolute Automated: 0.04 10*3/uL (ref 0.00–0.08)
Basophils Automated: 0.5 %
Eosinophils Absolute Automated: 0.24 10*3/uL (ref 0.00–0.44)
Eosinophils Automated: 2.9 %
Hematocrit: 27.2 % — ABNORMAL LOW (ref 34.7–43.7)
Hgb: 8.6 g/dL — ABNORMAL LOW (ref 11.4–14.8)
Immature Granulocytes Absolute: 0.02 10*3/uL (ref 0.00–0.07)
Immature Granulocytes: 0.2 %
Instrument Absolute Neutrophil Count: 5.31 10*3/uL (ref 1.10–6.33)
Lymphocytes Absolute Automated: 2.25 10*3/uL (ref 0.42–3.22)
Lymphocytes Automated: 26.8 %
MCH: 25.9 pg (ref 25.1–33.5)
MCHC: 31.6 g/dL (ref 31.5–35.8)
MCV: 81.9 fL (ref 78.0–96.0)
MPV: 9.2 fL (ref 8.9–12.5)
Monocytes Absolute Automated: 0.52 10*3/uL (ref 0.21–0.85)
Monocytes: 6.2 %
Neutrophils Absolute: 5.31 10*3/uL (ref 1.10–6.33)
Neutrophils: 63.4 %
Nucleated RBC: 0 /100 WBC (ref 0.0–0.0)
Platelets: 335 10*3/uL (ref 142–346)
RBC: 3.32 10*6/uL — ABNORMAL LOW (ref 3.90–5.10)
RDW: 18 % — ABNORMAL HIGH (ref 11–15)
WBC: 8.38 10*3/uL (ref 3.10–9.50)

## 2022-01-30 LAB — COMPREHENSIVE METABOLIC PANEL
ALT: 8 U/L (ref 0–55)
AST (SGOT): 17 U/L (ref 5–41)
Albumin/Globulin Ratio: 2.3 — ABNORMAL HIGH (ref 0.9–2.2)
Albumin: 3.7 g/dL (ref 3.5–5.0)
Alkaline Phosphatase: 44 U/L (ref 37–117)
Anion Gap: 6 (ref 5.0–15.0)
BUN: <3 mg/dL — ABNORMAL LOW (ref 7.0–21.0)
Bilirubin, Total: 0.5 mg/dL (ref 0.2–1.2)
CO2: 23 mEq/L (ref 17–29)
Calcium: 8.6 mg/dL (ref 8.5–10.5)
Chloride: 109 mEq/L (ref 99–111)
Creatinine: 0.5 mg/dL (ref 0.4–1.0)
Globulin: 1.6 g/dL — ABNORMAL LOW (ref 2.0–3.6)
Glucose: 111 mg/dL — ABNORMAL HIGH (ref 70–100)
Potassium: 4 mEq/L (ref 3.5–5.3)
Protein, Total: 5.3 g/dL — ABNORMAL LOW (ref 6.0–8.3)
Sodium: 138 mEq/L (ref 135–145)

## 2022-01-30 LAB — GFR: EGFR: >60

## 2022-01-30 MED ORDER — METOPROLOL SUCCINATE ER 25 MG PO TB24
37.5000 mg | ORAL_TABLET | Freq: Two times a day (BID) | ORAL | Status: DC
Start: 2022-01-30 — End: 2022-02-08
  Administered 2022-01-30 – 2022-02-08 (×17): 37.5 mg via ORAL
  Filled 2022-01-30 (×18): qty 2

## 2022-01-30 NOTE — UM Notes (Signed)
PATIENT NAME: [name],[name]   DOB: [birth year]     CSR Date: 01/30/22, Unit: [facility]  Pt is a 66 y.o. 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 Pt arrived to hospital on (01/26/2022 at 2142) with ongoing paraparesis.    PT/OT Discharge Recommendation: SNF    Vitals:  Temp 97.6  HR 121  RR 17  BP 108/74  SpO2 96% on RA    Medications:   Current Facility-Administered Medications   Medication Dose Route Frequency    apixaban  5 mg Oral Q12H SCH    budesonide-formoterol  2 puff Inhalation BID    diazePAM  5 mg Oral Q8H SCH    lurasidone  120 mg Oral QHS    metoprolol succinate XL  37.5 mg Oral Q12H    midodrine  10 mg Oral BID Meals    pantoprazole  40 mg Oral QAM    polyethylene glycol  17 g Oral Daily    rOPINIRole  0.5 mg Oral QHS    senna-docusate  2 tablet Oral QHS    tamsulosin  0.4 mg Oral Daily    thiamine  100 mg Oral Daily    vitamin B-6  50 mg Oral Daily    vitamin D  25 mcg Oral Daily    vitamins/minerals  1 tablet Oral Daily    zinc sulfate  220 mg Oral Daily      citrate dextrose Stopped (01/28/22 1608)     Abnormal Labs:   01/30/22 05:03   Hemoglobin 8.6 (L)   Hematocrit 27.2 (L)   BUN <3.0 (L)   Protein Total 5.3 (L)   Globulin 1.6 (L)   Albumin/Globulin Ratio 2.3 (H)     Assessment/Plan:  #Neuro: Weakness, Stiffness, Ambulatory Dysfunction for 6 weeks.  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 Hand weakness appears improved but paraparesis persists.  -Neurology following  -s/p Quinton Catheter.  S/p PLEX #1 of 3 (F/M/W)  -Benzodiazepine trial. Cont Valium 5mg  q8h.   -Routine Cancer screening as an outpatient.  -Neuro checks.   -PT/OT.      #Anemia of chronic disease  -Iron profile reviewed, B12 within normal limits  -on Eliquis     UA likely colonization, no signs/symptoms c/f UTI     Maintain Foley catheter for retetnion (4/7). Failed multiple voiding trials     Hx afib on Eliquis,   -Metop for rate control; increase to  37.5mg  XL BID  -Cont Eliquis     #HTN, HLD, DM (A1c 5.3 on 12/2021), COPD, TIAs, COPD, bipolar disorder, [facility] admit 2/7-2/17/23 for rhabdomyolysis (peak CK 4464 - statin d/c'ed) + vomiting ?gastroparesis + starvation ketoacidosis + UTI, [facility] admit 2/18-2/20/23 for rhabdomyolysis (peak CK 2509) + transaminitis + bilateral leg weakness + right paresthesias, admit 3/4-3/13 for bilateral hand/leg weakness and paresthesias  (unclear etiology, suspect myositis s/p IVIG but had facial paresthesias so it was stopped) + leg muscle edema on MRI (no biopsy done) + non-ischemic cardiomyopathy (EF 43%)      DVT/GI prophylaxis - SCDs. Eliquis BID          Recent Labs     01/30/22  0503 01/29/22  0428 01/28/22  0441   Potassium 4.0 3.3* 3.8         Diagnosis: Hypokalemia       UTILIZATION REVIEW CONTACT: Name: [name], BSN, RN  Clinical Case Manager  - Utilization Review  [facility]  Address:  [place], [place]  [address]  NPI:   [id number]  Tax ID:  [id number]  Phone: [phone]  Main: [phone]   Fax: [phone]    Please use fax number [phone] to provide authorization for hospital services or to request additional information.        NOTES TO REVIEWER:    This clinical review is based on/compiled from documentation provided by the treatment team within the patient's medical record.

## 2022-01-30 NOTE — Progress Notes (Signed)
CNS HOSPITALIST PROGRESS NOTE    Date Time: 01/30/22 9:51 AM  Patient Name: DanielleRose  Attending Physician: Les Pou, MD        History of Presenting Illness and Interval History/24 hour Events:   HPI per Admitting Provider  " [name] [name] is a 66 y.o. female hx afib on Eliquis, HTN, HLD, DM, COPD, TIAs, COPD, bipolar disorder, [facility] admit 2/7-2/17/23 for rhabdomyolysis (peak CK 4464 - statin d/c'ed) + vomiting ?gastroparesis + starvation ketoacidosis + UTI, [facility] admit 2/18-2/20/23 for rhabdomyolysis (peak CK 2509) + transaminitis + bilateral leg weakness + right paresthesias, admit 3/4-3/13 for bilateral hand/leg weakness and paresthesias  (unclear etiology, suspect myositis s/p IVIG but had facial paresthesias so it was stopped) + leg muscle edema on MRI (no biopsy done) + non-ischemic cardiomyopathy (EF 43%) who presents to the hospital with ongoing paraparesis. She was at [facility] rehab and her hand strength improved somewhat but she is still paraparetic. She was discharged to [facility] today but his neurologist Dr. 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 These symptoms are sudden onset, severe intensity, without alleviating factors.  "    Subjective 4/6: NAE. Patient reports no new focal complaints. Continues to show improvement with weakens, but sill reports limb stiffness.      Subjective 4/7: NAE. Patient reports improved mobility today after starting valium yesterday.    Subjective 4/8: NAE. Patient without focal complaints, tolerated PLEX yesterday  Subjective 4/9: Reports improved mobility in LE.   Physical Exam:     Vitals:    01/30/22 0821   BP:    Pulse: (!) 109   Resp:    Temp:    SpO2:        Intake and Output Summary (Last 24 hours) at Date Time    Intake/Output  Summary (Last 24 hours) at 01/30/2022 0951  Last data filed at 01/29/2022 2000  Gross per 24 hour   Intake --   Output 825 ml   Net -825 ml         General: awake, alert, oriented x 3; no acute distress.  HEENT: NC/AT.  MMM  Neck: supple  Cardiovascular: regular rate and rhythm, no murmurs, rubs or gallops  Lungs: clear to auscultation bilaterally, without wheezing, rhonchi, or rales  Abdomen: soft, non-tender, non-distended; normoactive bowel sounds, no rebound or guarding  Extremities: increased tone LLE>RLE.    Neuro: cranial nerves grossly intact, strength 5/5 in upper and 3/5 lower extremities, sensation intact except paraesthesias in hands/feet  Skin: no rashes or lesions noted      Medications:     Current Facility-Administered Medications   Medication Dose Route Frequency    apixaban  5 mg Oral Q12H SCH    budesonide-formoterol  2 puff Inhalation BID    diazePAM  5 mg Oral Q8H SCH    lurasidone  120 mg Oral QHS    metoprolol succinate XL  37.5 mg Oral Q12H    midodrine  10 mg Oral BID Meals    pantoprazole  40 mg Oral QAM    polyethylene glycol  17 g Oral Daily    rOPINIRole  0.5 mg Oral QHS    senna-docusate  2 tablet Oral QHS    tamsulosin  0.4 mg Oral Daily    thiamine  100 mg Oral Daily    vitamin B-6  50 mg Oral Daily    vitamin D  25 mcg Oral Daily    vitamins/minerals  1 tablet Oral Daily    zinc sulfate  220 mg Oral Daily         Labs:     Results       Procedure Component Value Units Date/Time    Comprehensive metabolic panel [[id number]]  (Abnormal) Collected: 01/30/22 0503    Specimen: Blood Updated: 01/30/22 0614     Glucose 111 mg/dL      BUN <4.0 mg/dL      Creatinine 0.5 mg/dL      Sodium 981 mEq/L      Potassium 4.0 mEq/L      Chloride 109 mEq/L      CO2 23 mEq/L      Calcium 8.6 mg/dL      Protein, Total 5.3 g/dL      Albumin 3.7 g/dL      AST (SGOT) 17 U/L      ALT 8 U/L      Alkaline Phosphatase 44 U/L      Bilirubin, Total 0.5 mg/dL      Globulin 1.6 g/dL      Albumin/Globulin Ratio 2.3      Anion Gap 6.0    GFR [[id number]] Collected: 01/30/22 0503     Updated: 01/30/22 0614     EGFR >60.0    CBC and differential [[id number]]  (Abnormal) Collected: 01/30/22 0503    Specimen: Blood Updated: 01/30/22 0538     WBC 8.38 x10 3/uL      Hgb 8.6 g/dL      Hematocrit 84.6 %      Platelets 335 x10 3/uL      RBC 3.32 x10 6/uL      MCV 81.9 fL      MCH 25.9 pg      MCHC 31.6 g/dL      RDW 18 %      MPV 9.2 fL      Instrument Absolute Neutrophil Count 5.31 x10 3/uL      Neutrophils 63.4 %      Lymphocytes Automated 26.8 %      Monocytes 6.2 %      Eosinophils Automated 2.9 %      Basophils Automated 0.5 %      Immature Granulocytes 0.2 %      Nucleated RBC 0.0 /100 WBC      Neutrophils Absolute 5.31 x10 3/uL      Lymphocytes Absolute Automated 2.25 x10 3/uL      Monocytes Absolute Automated 0.52 x10 3/uL      Eosinophils Absolute Automated 0.24 x10 3/uL      Basophils Absolute Automated 0.04 x10 3/uL      Immature Granulocytes Absolute 0.02 x10 3/uL      Absolute NRBC 0.00 x10 3/uL     Urine culture [[id number]] Collected: 01/28/22 1424    Specimen: Bladder Updated: 01/29/22 1738    Narrative:      ORDER#: [id number]                                    ORDERED BY: Les Pou  SOURCE: Urine  COLLECTED:  01/28/22 14:24  ANTIBIOTICS AT COLL.:                                RECEIVED :  01/28/22 14:36  Culture Urine                              PRELIM      01/29/22 17:38   +  01/29/22   >100,000 CFU/ML Escherichia coli               Further workup to follow including susceptibility testing                      @A1c @    Radiology:     Radiology Results (24 Hour)       ** No results found for the last 24 hours. **            Lines  Patient Lines/Drains/Airways Status       Active PICC Line / CVC Line / PIV Line / Drain / Airway / Intraosseous Line / Epidural Line / ART Line / Line / Wound / Pressure Ulcer / NG/OG Tube       Name Placement date Placement time Site Days    Urethral  Catheter Non-latex;Straight-tip 16 Fr. 01/23/22  1457  Non-latex;Straight-tip  3    Wound 01/03/22 Stage 1 Coccyx;Sacrum unblanchable redness 01/03/22  2200  Coccyx;Sacrum  23                  Urethral Catheter Non-latex;Straight-tip 16 Fr. (Active)   Catheter necessity reviewed? Yes 01/27/22 1100   Site Assessment WDL 01/27/22 0800   Pericare (With Urinary Catheter) Yes 01/27/22 1100   Collection Container Standard drainage bag with urimeter 01/27/22 1100   Securement Method Securement device 01/27/22 1100   Reason for Continuing Urinary Catheterization past POD 1 Acute urinary retention due to nerve injury 01/27/22 0800   Positioned catheter tubing for unobstructed urine flow: Yes 01/27/22 1100   Urine Output (mL) 300 mL 01/26/22 1500   Urine Output (mL) Total 300 mL 01/26/22 1500   Number of days: 3         Assessment:     Patient Active Problem List   Diagnosis    CVA (cerebral vascular accident)    Chest pain    Chest pain with high risk of acute coronary syndrome    Paroxysmal atrial fibrillation    Hypertension    Hyperlipidemia    Seizure disorder    History of gastrointestinal hemorrhage    Depression    Anemia    Asthma    Non-traumatic subcutaneous emphysema    Chest pain with moderate risk for cardiac etiology    Left-sided weakness    History of asthma    History of transient ischemic attack (TIA)    Type 2 diabetes mellitus    Syncope and collapse    Weakness of both legs    Hypokalemia    Thoracic back pain    Gastroparesis    Guillain Barr syndrome    Myositis    Stiff person syndrome     66 y.o. female hx afib on Eliquis, HTN, HLD, DM, COPD, TIAs, COPD, bipolar disorder, [facility] admit 2/7-2/17/23 for rhabdomyolysis (peak CK 4464 - statin d/c'ed) + vomiting ?gastroparesis + starvation ketoacidosis + UTI, [facility] admit 2/18-2/20/23 for rhabdomyolysis (  peak CK 2509) + transaminitis + bilateral leg weakness + right paresthesias, admit 3/4-3/13 for bilateral hand/leg weakness and paresthesias  (unclear  etiology, suspect myositis s/p IVIG but had facial paresthesias so it was stopped) + leg muscle edema on MRI (no biopsy done) + non-ischemic cardiomyopathy (EF 43%) who presents with ongoing paraparesis.     Plan:   #Neuro: Weakness, Stiffness, Ambulatory Dysfunction for 6 weeks.  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 Hand weakness appears improved but paraparesis persists.  -Neurology following  -s/p Quinton Catheter.  S/p PLEX #1 of 3 (F/M/W)  -Benzodiazepine trial. Cont Valium 5mg  q8h.   -Routine Cancer screening as an outpatient.  -Neuro checks.   -PT/OT.     #Anemia of chronic disease  -Iron profile reviewed, B12 within normal limits  -Hemoglobin remains stable on Eliquis    UA likely colonization, no signs/symptoms c/f UTI    Maintain Foley catheter for retetnion (4/7). Failed multiple voiding trials     Hx afib on Eliquis,   -Metop for rate control; increase to 37.5mg  XL BID  -Cont Eliquis    #HTN, HLD, DM (A1c 5.3 on 12/2021), COPD, TIAs, COPD, bipolar disorder, [facility] admit 2/7-2/17/23 for rhabdomyolysis (peak CK 4464 - statin d/c'ed) + vomiting ?gastroparesis + starvation ketoacidosis + UTI, [facility] admit 2/18-2/20/23 for rhabdomyolysis (peak CK 2509) + transaminitis + bilateral leg weakness + right paresthesias, admit 3/4-3/13 for bilateral hand/leg weakness and paresthesias  (unclear etiology, suspect myositis s/p IVIG but had facial paresthesias so it was stopped) + leg muscle edema on MRI (no biopsy done) + non-ischemic cardiomyopathy (EF 43%)      DVT/GI prophylaxis - SCDs. Eliquis BID                   Recent Labs     01/30/22  0503 01/29/22  0428 01/28/22  0441   Potassium 4.0 3.3* 3.8       Diagnosis: Hypokalemia        Signed by: Les Pou, MD, MD, MBA  cc:Malcolm Metro, MD

## 2022-01-30 NOTE — Plan of Care (Signed)
NURSING PROGRESS NOTE: STROKE UNIT    Patient Name: [name] (66 y.o. female)  Admission Date: 01/26/2022 [facility] Day 4)      Recent Labs   Lab 01/30/22  0503   Sodium 138   Potassium 4.0   Chloride 109   CO2 23   BUN <3.0*   Creatinine 0.5   EGFR >60.0   Glucose 111*   Calcium 8.6       Recent Labs   Lab 01/30/22  0503   WBC 8.38   Hgb 8.6*   Hematocrit 27.2*   Platelets 335         Patient Lines/Drains/Airways Status       Active Lines, Drains and Airways       Name Placement date Placement time Site Days    Temporary Catheter - Non-Tunneled 01/27/22 Internal Jugular Right 01/27/22  1525  -- 3    Midline IV 01/27/22 Anterior;Left Upper Arm 01/27/22  1738  Upper Arm  3    Urethral Catheter Non-latex 14 Fr. 01/28/22  1428  Non-latex  2                       Braden Scale Score: 17 (01/30/22 2000)      Skin Integrity: Scars  Bruising Skin Location: Scattered    Wound 01/03/22 Stage 1 Coccyx;Sacrum unblanchable redness (Active)   Date First Assessed/Time First Assessed: 01/03/22 2200   Pressure Injury Staging (WOCN/ Trained RNs Only): Stage 1  Location: Coccyx;Sacrum  Wound Description (Comments): unblanchable redness  Present on Admission: Yes      Assessments 01/03/2022 11:00 PM 01/30/2022  8:00 PM   Site Description Clean;Dry;Intact Dry;Intact   Peri-wound Description Clean;Dry --   Closure Open to air Open to air   Treatments Site care --   Dressing Foam --   Dressing Changed New --   Dressing Status Clean;Dry;Intact --       No associated orders.       Puncture Site 01/27/22 Venous Anterior;Right Neck (Active)   Date First Assessed/Time First Assessed: 01/27/22 1518   Puncture site location: Venous  Wound Location Orientation: Anterior;Right  Location: Neck      Assessments 01/27/2022  3:28 PM 01/30/2022  8:00 PM   Site Assessment Clean;Dry;Intact;Soft;Non Tender Clean;Dry   Dressing Status Dry;Intact Clean;Dry;Intact   Dressing Changed New --   Drainage Amount None --       No associated orders.       Safety  Checklist   1:1 Sitter N    Avasys N       Interpreter Services:  Does the patient require an Interpreter? N     If yes, what form of interpreter services was used? N    If family was utilized, is Tour manager form signed and in the chart?  N/A      ASSESSMENT/PLAN:    Last BM:  01/29/2022    Pending Orders: None    Discharge Plan:  To SNF Facility    POC / Family Update: None    Shift Note: Patient is alert and oriented x 4,follows commands, sensation is intact with some paresthesias noted in hand and feet, strength 5/5 BUE 5/5 and 3/5 BLE, due meds given, VSS closely monitored. Safety maintained. Continue plan of care.    PPE worn in patient's room:  Standard Precaution

## 2022-01-30 NOTE — Plan of Care (Signed)
NURSING PROGRESS NOTE: STROKE UNIT    Patient Name: [name] (66 y.o. female)  Admission Date: 01/26/2022 [facility] Day 4)      Recent Labs   Lab 01/30/22  0503   Sodium 138   Potassium 4.0   Chloride 109   CO2 23   BUN <3.0*   Creatinine 0.5   EGFR >60.0   Glucose 111*   Calcium 8.6       Recent Labs   Lab 01/30/22  0503   WBC 8.38   Hgb 8.6*   Hematocrit 27.2*   Platelets 335         Patient Lines/Drains/Airways Status       Active Lines, Drains and Airways       Name Placement date Placement time Site Days    Temporary Catheter - Non-Tunneled 01/27/22 Internal Jugular Right 01/27/22  1525  -- 3    Midline IV 01/27/22 Anterior;Left Upper Arm 01/27/22  1738  Upper Arm  2    Urethral Catheter Non-latex 14 Fr. 01/28/22  1428  Non-latex  2                       Braden Scale Score: 17 (01/30/22 0800)      Skin Integrity: Scars  Bruising Skin Location: Scattered    Wound 01/03/22 Stage 1 Coccyx;Sacrum unblanchable redness (Active)   Date First Assessed/Time First Assessed: 01/03/22 2200   Pressure Injury Staging (WOCN/ Trained RNs Only): Stage 1  Location: Coccyx;Sacrum  Wound Description (Comments): unblanchable redness  Present on Admission: Yes      Assessments 01/03/2022 11:00 PM 01/30/2022  4:00 PM   Site Description Clean;Dry;Intact Dry;Intact   Peri-wound Description Clean;Dry --   Closure Open to air Open to air   Treatments Site care --   Dressing Foam --   Dressing Changed New --   Dressing Status Clean;Dry;Intact --       No associated orders.       Puncture Site 01/27/22 Venous Anterior;Right Neck (Active)   Date First Assessed/Time First Assessed: 01/27/22 1518   Puncture site location: Venous  Wound Location Orientation: Anterior;Right  Location: Neck      Assessments 01/27/2022  3:28 PM 01/30/2022  4:00 PM   Site Assessment Clean;Dry;Intact;Soft;Non Tender Clean;Dry   Dressing Status Dry;Intact Clean;Dry;Intact   Dressing Changed New --   Drainage Amount None --       No associated orders.       Safety  Checklist   1:1 Sitter N    Avasys N       Interpreter Services:  Does the patient require an Interpreter? N    If yes, what form of interpreter services was used? NA     If family was utilized, is interpreter waiver form signed and in the chart? NA      ASSESSMENT/PLAN:    Last BM: 01/30/22    Pending Orders: None    Discharge Plan: SNF    POC / Family Update: None    Shift Note: Pt resting in bed.  No signs of distress.  She continues to have tingling to her hands and lower legs.  Gait remains unsteady.  Remaining neuro assessment unchanged.  She has ambulated to the restroom with walker.  She did have one episode of fecal incontinence this morning following her miralax but otherwise she has no issues going to the bathroom.  Complete system assessments can be found in flowsheets.  Vss.  PPE worn in patient's room:  Standard precautions      Problem: Moderate/High Fall Risk Score >5  Goal: Patient will remain free of falls  Outcome: Progressing  Flowsheets (Taken 01/28/2022 2000 by Louie BostonBalderrama Muyba, Marcelo, RN)  Moderate Risk (6-13):   MOD-Apply bed exit alarm if patient is confused   MOD-Floor mat at bedside (where available) if appropriate   MOD-Consider a move closer to Nurses Station   MOD-Remain with patient during toileting     Problem: Neurological Deficit  Goal: Neurological status is stable or improving  Outcome: Progressing  Flowsheets (Taken 01/30/2022 0230 by Hildred AlaminBatac, Jessica, RN)  Neurological status is stable or improving:   Monitor/assess/document neurological assessment (Stroke: every 4 hours)   Observe for seizure activity and initiate seizure precautions if indicated   Perform CAM Assessment     Problem: Impaired Mobility  Goal: Mobility/Activity is maintained at optimal level for patient  Outcome: Progressing  Flowsheets (Taken 01/28/2022 0200 by Kyra LeylandBennett, Josephine, RN)  Mobility/activity is maintained at optimal level for patient:   Encourage independent activity per ability   Plan activities to  conserve energy, plan rest periods   Perform active/passive ROM   Reposition patient every 2 hours and as needed unless able to reposition self   Maintain proper body alignment   Increase mobility as tolerated/progressive mobility   Assess for changes in respiratory status, level of consciousness and/or development of fatigue   Consult/collaborate with Physical Therapy and/or Occupational Therapy

## 2022-01-30 NOTE — Plan of Care (Signed)
NURSING PROGRESS NOTE: STROKE UNIT    Patient Name: Aalani Aikens (66 y.o. female)  Admission Date: 01/26/2022 [facility] Day 4)      Recent Labs   Lab 01/29/22  0428   Sodium 139   Potassium 3.3*   Chloride 109   CO2 22   BUN <3.0*   Creatinine 0.5   EGFR >60.0   Glucose 101*   Calcium 8.5       Recent Labs   Lab 01/29/22  0428   WBC 8.90   Hgb 8.0*   Hematocrit 24.9*   Platelets 288         Patient Lines/Drains/Airways Status       Active Lines, Drains and Airways       Name Placement date Placement time Site Days    Temporary Catheter - Non-Tunneled 01/27/22 Internal Jugular Right 01/27/22  1525  -- 2    Midline IV 01/27/22 Anterior;Left Upper Arm 01/27/22  1738  Upper Arm  2    Urethral Catheter Non-latex 14 Fr. 01/28/22  1428  Non-latex  1                       Braden Scale Score: 17 (01/29/22 2000)      Skin Integrity: Scars  Bruising Skin Location: Scattered    Wound 01/03/22 Stage 1 Coccyx;Sacrum unblanchable redness (Active)   Date First Assessed/Time First Assessed: 01/03/22 2200   Pressure Injury Staging (WOCN/ Trained RNs Only): Stage 1  Location: Coccyx;Sacrum  Wound Description (Comments): unblanchable redness  Present on Admission: Yes      Assessments 01/03/2022 11:00 PM 01/29/2022  8:00 PM   Site Description Clean;Dry;Intact Intact;Dry   Peri-wound Description Clean;Dry Dry;Clean;Intact   Closure Open to air Open to air   Drainage Amount -- None   Treatments Site care Zinc - oxide paste   Dressing Foam Open to air   Dressing Changed New --   Dressing Status Clean;Dry;Intact --       No associated orders.       Puncture Site 01/27/22 Venous Anterior;Right Neck (Active)   Date First Assessed/Time First Assessed: 01/27/22 1518   Puncture site location: Venous  Wound Location Orientation: Anterior;Right  Location: Neck      Assessments 01/27/2022  3:28 PM 01/29/2022  8:00 PM   Site Assessment Clean;Dry;Intact;Soft;Non Tender Clean;Dry   Dressing Status Dry;Intact Clean;Dry;Intact   Dressing Changed New --    Drainage Amount None Scant       No associated orders.       Safety Checklist   1:1 Sitter N    Avasys N       Interpreter Services:  Does the patient require an Interpreter? NO    If yes, what form of interpreter services was used? N/A    If family was utilized, is interpreter waiver form signed and in the chart? N/A      ASSESSMENT/PLAN:    Last BM:    Pending Orders: None    Discharge Plan: SNF    POC / Family Update: None    Shift Note:  Patient is alert and oriented x 4, follows command,speech is cleared,due meds given, telemetry ordered for HR sustaining to 115's to 120's MD aware. Up with 1/2 to assist to bedside commode, still have weakness on both legs. VSS closely monitored. Safety maintained,continue plan of care.    PPE worn in patient's room:  N/A      Problem: Moderate/High Fall Risk Score >  5  Goal: Patient will remain free of falls  Outcome: Progressing  Flowsheets (Taken 01/30/2022 0230)  High (Greater than 13):   HIGH-Bed alarm on at all times while patient in bed   HIGH-Consider use of low bed     Problem: Compromised Tissue integrity  Goal: Damaged tissue is healing and protected  Outcome: Progressing  Flowsheets (Taken 01/30/2022 0230)  Damaged tissue is healing and protected:   Monitor/assess Braden scale every shift   Provide wound care per wound care algorithm   Reposition patient every 2 hours and as needed unless able to reposition self   Avoid shearing injuries   Increase activity as tolerated/progressive mobility   Relieve pressure to bony prominences for patients at moderate and high risk   Monitor patient's hygiene practices   Monitor external devices/tubes for correct placement to prevent pressure, friction and shearing  Goal: Nutritional status is improving  Outcome: Progressing  Flowsheets (Taken 01/30/2022 0230)  Nutritional status is improving:   Assist patient with eating   Allow adequate time for meals   Encourage patient to take dietary supplement(s) as ordered   Include  patient/patient care companion in decisions related to nutrition     Problem: Neurological Deficit  Goal: Neurological status is stable or improving  Outcome: Progressing  Flowsheets (Taken 01/30/2022 0230)  Neurological status is stable or improving:   Monitor/assess/document neurological assessment (Stroke: every 4 hours)   Observe for seizure activity and initiate seizure precautions if indicated   Perform CAM Assessment     Problem: Anxiety  Goal: Anxiety is at a manageable level  Outcome: Progressing  Flowsheets (Taken 01/30/2022 0230)  Anxiety is at a manageable level:   Inform/explain to patient/patient care companion all tests/procedures/treatment/care prior to initiation   Assess emotional status and coping mechanisms   Facilitate expression of feelings, fears, concerns, anxiety   Provide and maintain a safe environment   Assist in developing anxiety-reducing skills   Encourage participation in care

## 2022-01-31 ENCOUNTER — Ambulatory Visit: Payer: Medicare Other

## 2022-01-31 LAB — CBC
Absolute NRBC: 0 10*3/uL (ref 0.00–0.00)
Hematocrit: 24.4 % — ABNORMAL LOW (ref 34.7–43.7)
Hgb: 7.8 g/dL — ABNORMAL LOW (ref 11.4–14.8)
MCH: 25.8 pg (ref 25.1–33.5)
MCHC: 32 g/dL (ref 31.5–35.8)
MCV: 80.8 fL (ref 78.0–96.0)
MPV: 8.8 fL — ABNORMAL LOW (ref 8.9–12.5)
Nucleated RBC: 0 /100 WBC (ref 0.0–0.0)
Platelets: 282 10*3/uL (ref 142–346)
RBC: 3.02 10*6/uL — ABNORMAL LOW (ref 3.90–5.10)
RDW: 18 % — ABNORMAL HIGH (ref 11–15)
WBC: 8.79 10*3/uL (ref 3.10–9.50)

## 2022-01-31 LAB — CBC AND DIFFERENTIAL
Absolute NRBC: 0 10*3/uL (ref 0.00–0.00)
Basophils Absolute Automated: 0.03 10*3/uL (ref 0.00–0.08)
Basophils Automated: 0.3 %
Eosinophils Absolute Automated: 0.19 10*3/uL (ref 0.00–0.44)
Eosinophils Automated: 2.1 %
Hematocrit: 25.3 % — ABNORMAL LOW (ref 34.7–43.7)
Hgb: 8.2 g/dL — ABNORMAL LOW (ref 11.4–14.8)
Immature Granulocytes Absolute: 0.04 10*3/uL (ref 0.00–0.07)
Immature Granulocytes: 0.4 %
Instrument Absolute Neutrophil Count: 5.73 10*3/uL (ref 1.10–6.33)
Lymphocytes Absolute Automated: 2.38 10*3/uL (ref 0.42–3.22)
Lymphocytes Automated: 26.7 %
MCH: 26.4 pg (ref 25.1–33.5)
MCHC: 32.4 g/dL (ref 31.5–35.8)
MCV: 81.4 fL (ref 78.0–96.0)
MPV: 9.4 fL (ref 8.9–12.5)
Monocytes Absolute Automated: 0.55 10*3/uL (ref 0.21–0.85)
Monocytes: 6.2 %
Neutrophils Absolute: 5.73 10*3/uL (ref 1.10–6.33)
Neutrophils: 64.3 %
Nucleated RBC: 0 /100 WBC (ref 0.0–0.0)
Platelets: 322 10*3/uL (ref 142–346)
RBC: 3.11 10*6/uL — ABNORMAL LOW (ref 3.90–5.10)
RDW: 18 % — ABNORMAL HIGH (ref 11–15)
WBC: 8.92 10*3/uL (ref 3.10–9.50)

## 2022-01-31 LAB — BASIC METABOLIC PANEL
Anion Gap: 8 (ref 5.0–15.0)
Anion Gap: 8 (ref 5.0–15.0)
BUN: 6 mg/dL — ABNORMAL LOW (ref 7.0–21.0)
BUN: 5 mg/dL — ABNORMAL LOW (ref 7.0–21.0)
CO2: 24 mEq/L (ref 17–29)
CO2: 26 mEq/L (ref 17–29)
Calcium: 8.6 mg/dL (ref 8.5–10.5)
Calcium: 8.6 mg/dL (ref 8.5–10.5)
Chloride: 108 mEq/L (ref 99–111)
Chloride: 106 mEq/L (ref 99–111)
Creatinine: 0.5 mg/dL (ref 0.4–1.0)
Creatinine: 0.5 mg/dL (ref 0.4–1.0)
Glucose: 108 mg/dL — ABNORMAL HIGH (ref 70–100)
Glucose: 108 mg/dL — ABNORMAL HIGH (ref 70–100)
Potassium: 3.3 mEq/L — ABNORMAL LOW (ref 3.5–5.3)
Potassium: 3.6 mEq/L (ref 3.5–5.3)
Sodium: 140 mEq/L (ref 135–145)
Sodium: 140 mEq/L (ref 135–145)

## 2022-01-31 LAB — GFR
EGFR: >60
EGFR: >60

## 2022-01-31 LAB — CALCIUM, IONIZED: Calcium, Ionized: 2.5 mEq/L (ref 2.30–2.58)

## 2022-01-31 LAB — FIBRINOGEN: Fibrinogen: 236 mg/dL (ref 181–413)

## 2022-01-31 MED ORDER — ALTEPLASE 2 MG IJ SOLR
2.0000 mg | INTRAMUSCULAR | Status: DC | PRN
Start: 2022-01-31 — End: 2022-02-08

## 2022-01-31 MED ORDER — DIPHENHYDRAMINE HCL 50 MG/ML IJ SOLN
25.0000 mg | Freq: Four times a day (QID) | INTRAMUSCULAR | Status: DC | PRN
Start: 2022-01-31 — End: 2022-02-08
  Administered 2022-02-02 – 2022-02-05 (×2): 25 mg via INTRAVENOUS
  Filled 2022-01-31: qty 1

## 2022-01-31 MED ORDER — CALCIUM CARBONATE ANTACID 500 MG PO CHEW
1000.0000 mg | CHEWABLE_TABLET | Freq: Four times a day (QID) | ORAL | Status: DC | PRN
Start: 2022-01-31 — End: 2022-02-08

## 2022-01-31 MED ORDER — ALBUMIN HUMAN/BIOSIMILIAR 5% IV SOLN (WRAP)
25.0000 g | INTRAVENOUS | Status: AC
Start: 2022-01-31 — End: 2022-01-31
  Administered 2022-01-31: 125 g via INTRAVENOUS
  Filled 2022-01-31: qty 2500

## 2022-01-31 MED ORDER — HEPARIN SODIUM (PORCINE) 5000 UNIT/ML IJ SOLN
5000.0000 [IU] | Freq: Once | INTRAMUSCULAR | Status: DC
Start: 2022-01-31 — End: 2022-02-08
  Filled 2022-01-31 (×3): qty 1

## 2022-01-31 MED ORDER — CALCIUM GLUCONATE-NACL 1-0.675 GM/50ML-% IV SOLN
1.0000 g | INTRAVENOUS | Status: AC
Start: 2022-01-31 — End: 2022-01-31
  Administered 2022-01-31 (×3): 1 g via INTRAVENOUS
  Filled 2022-01-31: qty 50

## 2022-01-31 MED ORDER — ACD FORMULA A 0.73-2.45-2.2 GM/100ML VI SOLN (APHERESIS)
300.0000 mL | Status: DC
Start: 2022-01-31 — End: 2022-02-08
  Administered 2022-01-31: 300 mL via INTRAVENOUS

## 2022-01-31 MED ORDER — HEPARIN SODIUM (PORCINE) 5000 UNIT/ML IJ SOLN
5000.0000 [IU] | Freq: Once | INTRAMUSCULAR | Status: DC
Start: 2022-01-31 — End: 2022-02-08

## 2022-01-31 NOTE — Procedures (Signed)
APHERESIS PROCEDURE NOTE - Therapeutic Plasmapheresis # 2/3    Date Time: 01/31/2022 4:55 PM  Patient Name: [name],[name]  MRN: [id number]  DOB: 07/31/1956  Sex: female  Procedure: Therapeutic Plasma Exchange (Plasmapheresis)  Performing Provider: Olam IdlerMichelle D Meyli Boice, MD  Performing RN: Robbie LisMichelle Sapida, RN    Consent:      This procedure has been fully reviewed with the patient and written informed consent has been obtained.  *Consent in the file.    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 She has h/o afib (on eliquis), HTN, HLD, DM, COPD, TIA, bipolar d/o, recent rhabdomyolysis (02/23), [facility] and discharged to acute rehab 01/26/22. Dr. Joyce GrossKay requested for PLEX.     The patient tolerated today's procedure well with no complications or adverse reactions. Vitals were stable throughout. Will continue future plasmapheresis procedures per schedule.    Past Medical History:     Active Ambulatory Problems     Diagnosis Date Noted    CVA (cerebral vascular accident) 08/21/2018    Chest pain 08/22/2018    Chest pain with high risk of acute coronary syndrome 01/06/2019    Paroxysmal atrial fibrillation 01/06/2019    Hypertension 01/06/2019    Hyperlipidemia 01/06/2019    Seizure disorder 01/06/2019    History of gastrointestinal hemorrhage 01/06/2019    Depression 01/06/2019    Anemia 01/06/2019    Asthma 01/06/2019    Non-traumatic subcutaneous emphysema 01/07/2019    Chest pain with moderate risk for cardiac etiology 05/11/2019    Left-sided weakness 05/20/2019    History of asthma 06/16/2019    History of transient ischemic attack (TIA) 06/16/2019    Type 2 diabetes mellitus 06/16/2019    Syncope and collapse 06/16/2019    Weakness of both legs 12/25/2021    Hypokalemia 12/26/2021    Thoracic back pain  12/26/2021    Gastroparesis 12/26/2021    Guillain Barr syndrome 12/25/2021    Myositis 01/03/2022     Resolved Ambulatory Problems     Diagnosis Date Noted    No Resolved Ambulatory Problems     Past Medical History:   Diagnosis Date    Atrial fibrillation     Chronic obstructive pulmonary disease     Convulsions     Diabetes mellitus     TIA (transient ischemic attack)        Past Surgical History:     Past Surgical History:   Procedure Laterality Date    CARDIAC CATHETERIZATION  2017    HYSTERECTOMY      NON-TUNNELED CATH PLACEMENT [facility] N/A 01/27/2022    Procedure: Mena GoesQUINTON;  Surgeon: Verline LemaKaushal, Pankaj, MD;  Location: [facility] CARDIAC CATH;  Service: Interventional Radiology;  Laterality: N/A;    pci      TRIPLE LUMEN DIALYSIS CATH N/A 12/28/2021    Procedure: TRIPLE LUMEN DIALYSIS CATH;  Surgeon: Tamala BariYou, Eric Y, MD;  Location: [facility] CARDIAC CATH;  Service: Interventional Radiology;  Laterality: N/A;       Allergies:     Allergies   Allergen Reactions    Singulair [Montelukast] Itching    Immune Globulin (Human) Itching and Facial Swelling     Pt complain of itchiness and swelling of lips and  tongue. Saturation fine, bp elevated.    Myoview [Technetium-47m] Itching    Atorvastatin Other (See Comments)     Pt states she thinks it caused her to have rhabdo    Contrast [Iodinated Contrast Media]     Latex     Magnesium Chloride Itching    Nitroglycerin     Penicillins     Tetracyclines & Related          Physical Exam:   Temp:  [97 F (36.1 C)-98.4 F (36.9 C)] 98.2 F (36.8 C)  Heart Rate:  [78-120] 112  Resp Rate:  [15-18] 18  BP: (104-125)/(67-82) 109/75    Chest - clear to auscultation, no wheezes, rales or rhonchi, symmetric air entry  Abdomen - soft, nontender, nondistended, no masses or organomegaly  Extremities - no pedal edema noted    Lab:     Recent Labs     01/31/22  0501   Glucose 108*   BUN 5.0*   Creatinine 0.5   Calcium 8.6   Sodium 140   Potassium 3.6   Chloride 106   CO2 26     Recent Labs     01/31/22  0501    WBC 8.92   RBC 3.11*   Hgb 8.2*   Hematocrit 25.3*   MCV 81.4   MCHC 32.4   RDW 18*   MPV 9.4   Platelets 322     Recent Labs     01/31/22  0117   Fibrinogen 236     Recent Labs     01/31/22  0129   Calcium, Ionized 2.50       Procedure Notes:     Vital signs are stable throughout the procedure     5% albumin was used as replacement fluid for the plasma exchange procedure    Fluid Balance:   See nursing flowsheet    Complications:   None    Signed by: Olam Idler, MD, MD  Date/Time: 01/31/2022 4:55 PM

## 2022-01-31 NOTE — Progress Notes (Signed)
[facility] Consultation Progress Note    Date Time: 01/31/22 9:56 AM  Patient Name: [facility]  Outpatient Neurologist :    CC: LE weakness, paresthesia      Assessment:   66 yo F with PMHx of afib (on eliquis), HTN, HLD, DM, COPD, TIA, bipolar d/o, recent rhabdomyolysis (02/23), recent [facility] admission 3/13-01/26/22 for weakness and discharged to acute rehab 01/26/22. Pt called back to [facility] on recommendation of neurologist Dr. 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 Ongoing trial of PLEX (first session completed on 4/7). In the future low threshold for muscle biopsy (rectus femoris recommended based on EMG results). On exam, pt drowsy today though had just received 25mg  benadryl d/t c/o mouth itching following PLEX.    -Workup on recent admission 3/13-4/5:  -CSF protein 52, WBC 1. ME panel, cx, HSV negative  -ENS2: +GAD65 Ab  -Ganglioside Ab panel neg  -Motor sensory neuropathy panel neg  -MG panel neg  -RPR, HIV nonreactive  -TSH, B12, copper,  wnl              -Anaphylactic response to IVIg (IgA 435) Lips, tongue swelling with itchiness.              -3/6 EMG showing a myopathic process affecting proximal BLE              -CT chest/abd/pelvis unremarkable              -CK 239, Vit D 10. LDH 450  -MRI femur bilat WWO 3/9: Mild edema and atrophy involving the semimembranosus and biceps femoris muscles of the right and left thighs, noted to be bilateral and symmetric in appearance.       -Workup this admission:  -CK 21        Patient Active Problem List   Diagnosis    CVA (cerebral vascular accident)    Chest pain    Chest pain with high risk of acute coronary syndrome    Paroxysmal atrial fibrillation    Hypertension    Hyperlipidemia    Seizure disorder    History of gastrointestinal hemorrhage     Depression    Anemia    Asthma    Non-traumatic subcutaneous emphysema    Chest pain with moderate risk for cardiac etiology    Left-sided weakness    History of asthma    History of transient ischemic attack (TIA)    Type 2 diabetes mellitus    Syncope and collapse    Weakness of both legs    Hypokalemia    Thoracic back pain    Gastroparesis    Guillain Barr syndrome    Myositis    Stiff person syndrome       Plan:   -Trial of PLEX x 3 sessions, #2/3 today  -Currently on valium 5mg  q8hrs for leg stiffness   -supportive measures.   Neurology Attending Note:    Total time of the encounter was 35 minutes, of which >50% was spent reviewing the chart, updating orders, coordinating care, and at the bedside counseling. I performed the substantive portion of the visit by providing more than 50% of the total encounter time, and spent 20 minutes. Patient was also seen by  APP  who spent .  I reviewed the chart, relevant neuro-imaging and labs on the  above patient.  I personally examined the patient.  I also conferred with the midlevel and agree with the findings and plan outlined above.           Kendrick Fries, M.D.  Board Certified Neurology, ABPN  Board Certified NeuroImaging, UCNS   [facility]       Interval History/Subjective:   Pt drowsy, less participatory with subjective hx. She does endorse improvement in LLE stiffness.      Medications:     Current Facility-Administered Medications   Medication Dose Route Frequency    apixaban  5 mg Oral Q12H SCH    budesonide-formoterol  2 puff Inhalation BID    calcium GLUConate  1 g Intravenous Q45 MIN    diazePAM  5 mg Oral Q8H SCH    heparin (porcine)  5,000 Units Intracatheter Once    heparin (porcine)  5,000 Units Intracatheter Once    lurasidone  120 mg Oral QHS    metoprolol succinate XL  37.5 mg Oral Q12H    midodrine  10 mg Oral BID Meals    pantoprazole  40 mg Oral QAM    polyethylene glycol  17 g Oral Daily    rOPINIRole  0.5 mg Oral QHS    senna-docusate   2 tablet Oral QHS    tamsulosin  0.4 mg Oral Daily    thiamine  100 mg Oral Daily    vitamin B-6  50 mg Oral Daily    vitamin D  25 mcg Oral Daily    vitamins/minerals  1 tablet Oral Daily    zinc sulfate  220 mg Oral Daily       Review of Systems:   Neurological ROS: limited d/t pt factors however does endorses improvement in LLE stiffness.    Physical Exam:   Temp:  [97 F (36.1 C)-98.4 F (36.9 C)] 97 F (36.1 C)  Heart Rate:  [78-121] 114  Resp Rate:  [16-18] 16  BP: (104-125)/(67-82) 116/76    Vital Signs:  Reviewed    General: Well developed and well nourished. No acute distress. Cooperative with the exam  ENT: Normal oral mucosa, no ear or nose discharge  Neck: Symmetric, no deformities  CV: RRR  Resp: No audible wheezing, normal work of breathing  Abd: Soft, nondistended  Skin: Intact, extremities normal in color  Psych: Affect is drowsy    Mental Status: The patient is woken from sleep, drowsy. Opens eyes to voice, keeps closing eyes. Oriented to self, [place], 2023. States "July" for month.  Fund of knowledge appropriate  Attention span and concentration appear reduced.  Language function is normal. There is no evidence of aphasia in conversational speech.    Cranial nerves:   -CN II: tracking  -CN III, IV, VI: Pupils equal, round, and reactive to light; extraocular movements sluggish but intact; no ptosis              -CN V: Facial sensation intact in V1 through V3 distributions   -CN VII: trace L facial droop   -CN VIII: Hearing intact to conversational speech   -CN IX, X: Normal phonation   -CN XI: Symmetric full strength of trapezius muscles   -CN XII: Tongue protrudes midline    Motor: Muscle tone normal without spasticity or flaccidity. No atrophy.      UEs:   Deltoid Bicep Tricep Grip   Right 5 5 5 5    Left 5 5 5 5      LEs   HF KF  KE PF DF   Right 2 4 5 5 5    Left 2 4 4+ 5 5     Sensory:   LT and Temp diminished left hand, left leg    Reflexes:   Plantars: flexor b/l    Coordination: No  tremors    Gait: deferred      Labs:     Results       Procedure Component Value Units Date/Time    Basic Metabolic Panel [[id number]][[id number]]  (Abnormal) Collected: 01/31/22 0501    Specimen: Blood Updated: 01/31/22 0629     Glucose 108 mg/dL      BUN 5.0 mg/dL      Creatinine 0.5 mg/dL      Calcium 8.6 mg/dL      Sodium 086140 mEq/L      Potassium 3.6 mEq/L      Chloride 106 mEq/L      CO2 26 mEq/L      Anion Gap 8.0    GFR [[id number]][[id number]] Collected: 01/31/22 0501     Updated: 01/31/22 0629     EGFR >60.0    CBC and differential [[id number]][[id number]]  (Abnormal) Collected: 01/31/22 0501    Specimen: Blood Updated: 01/31/22 0555     WBC 8.92 x10 3/uL      Hgb 8.2 g/dL      Hematocrit 01.025.3 %      Platelets 322 x10 3/uL      RBC 3.11 x10 6/uL      MCV 81.4 fL      MCH 26.4 pg      MCHC 32.4 g/dL      RDW 18 %      MPV 9.4 fL      Instrument Absolute Neutrophil Count 5.73 x10 3/uL      Neutrophils 64.3 %      Lymphocytes Automated 26.7 %      Monocytes 6.2 %      Eosinophils Automated 2.1 %      Basophils Automated 0.3 %      Immature Granulocytes 0.4 %      Nucleated RBC 0.0 /100 WBC      Neutrophils Absolute 5.73 x10 3/uL      Lymphocytes Absolute Automated 2.38 x10 3/uL      Monocytes Absolute Automated 0.55 x10 3/uL      Eosinophils Absolute Automated 0.19 x10 3/uL      Basophils Absolute Automated 0.03 x10 3/uL      Immature Granulocytes Absolute 0.04 x10 3/uL      Absolute NRBC 0.00 x10 3/uL     Calcium, ionized [[id number]][[id number]] Collected: 01/31/22 0129    Specimen: Blood Updated: 01/31/22 0158     Calcium, Ionized 2.50 mEq/L     Narrative:      Pre-procedure if AM lab not done  Rescheduled by [id number] at 01/31/2022 01:19 Reason: Specimen drawn in   proximity to IV site.    Fibrinogen [[id number]][[id number]] Collected: 01/31/22 0117    Specimen: Blood Updated: 01/31/22 0156     Fibrinogen 236 mg/dL     Narrative:      Pre-procedure, If AM lab not done  Pre-procedure if AM lab not done    GFR [[id number]][[id number]] Collected: 01/31/22 0117     Updated: 01/31/22 0153      EGFR >60.0    Narrative:      Pre-procedure, If AM lab not done  Pre-procedure if AM lab not done    Basic Metabolic Panel [[id number]][[id number]]  (Abnormal) Collected: 01/31/22  0117    Specimen: Blood Updated: 01/31/22 0153     Glucose 108 mg/dL      BUN 6.0 mg/dL      Creatinine 0.5 mg/dL      Calcium 8.6 mg/dL      Sodium 161 mEq/L      Potassium 3.3 mEq/L      Chloride 108 mEq/L      CO2 24 mEq/L      Anion Gap 8.0    Narrative:      Pre-procedure, If AM lab not done  Pre-procedure if AM lab not done    CBC without differential [[id number]]  (Abnormal) Collected: 01/31/22 0117    Specimen: Blood Updated: 01/31/22 0146     WBC 8.79 x10 3/uL      Hgb 7.8 g/dL      Hematocrit 81.1 %      Platelets 282 x10 3/uL      RBC 3.02 x10 6/uL      MCV 80.8 fL      MCH 25.8 pg      MCHC 32.0 g/dL      RDW 18 %      MPV 8.8 fL      Nucleated RBC 0.0 /100 WBC      Absolute NRBC 0.00 x10 3/uL     Narrative:      Pre-procedure, If AM lab not done  Pre-procedure if AM lab not done    Urine culture [[id number]] Collected: 01/28/22 1424    Specimen: Bladder Updated: 01/30/22 1526    Narrative:      ORDER#: [id number]                                    ORDERED BY: Les Pou  SOURCE: Urine                                        COLLECTED:  01/28/22 14:24  ANTIBIOTICS AT COLL.:                                RECEIVED :  01/28/22 14:36  Culture Urine                              FINAL       01/30/22 15:26   +  01/30/22   >100,000 CFU/ML Escherichia coli      _____________________________________________________________________________                                     E.coli       ANTIBIOTICS                     MIC  INTRP      _____________________________________________________________________________  Amoxicillin/CA                 >16/8   R        Ampicillin                      >16    R  D1    Ampicillin/sulbactam           >  16/8   R        Aztreonam                       <=2    S        Cefazolin                       >16    R  D2     Cefepime                        <=1    S        Cefoxitin                       16     I        Ceftazidime                     <=2    S        Ceftriaxone                     <=1    S  D3    Cefuroxime                      16     I        Ciprofloxacin                 <=0.25   S        Ertapenem                     <=0.25   S        Gentamicin                      <=2    S        Levofloxacin                   <=0.5   S        Meropenem                      <=0.5   S        Nitrofurantoin                 <=16    S  D4    Piperacillin/Tazobactam         8/4    S        Tetracycline                    <=2    S        Trimethoprim/Sulfamethoxazole <=0.5/9  S          -----DRUG COMMENTS----------    D1:  If oral therapy is desired AND ampicillin is susceptible,         consider amoxicillin.         [place] Antimicrobial Subcommittee Nov. 2020    D2:  Cefazolin interpretation is for uncomplicated UTI only.         If oral therapy is desired for uncomplicated UTI AND E.coli is         susceptible to cefazolin, consider cephalexin, cefdinir,         cefpodoxime, or cefprozil.  [place] Antimicrobial Subcommittee Nov. 2020    D3:  Ceftriaxone does not predict cefdinir susceptibility.         [place] Antimicrobial Subcommittee Nov. 2020    D4:  Nitrofurantoin should only be used for the treatment of         uncomplicated cystitis.         [facility] Antimicrobial Subcommittee June 2015  _____________________________________________________________________________            S=SUSCEPTIBLE     I=INTERMEDIATE     R=RESISTANT       N/S=NON-SUSCEPTIBLE     SDD=SUSCEPTIBLE-DOSE DEPENDENT  _____________________________________________________________________________              Rads:   Non-Tunneled Cath Placement [facility])    Result Date: 01/28/2022  Right internal jugular approach non-tunneled ([place]) central venous catheter placement. PLAN: Catheter is ready for use. Verline Lema MD 01/28/2022 11:27 AM       Signed by:  Sarita Haver, PA PA-C   [facility]  [facility] Consult Spectra: [phone]  [facility] Consult Spectra: [phone]  Between 1630-1900, please epic chat: "FX [facility] General Neurology"  After 1900, call: [phone]  2:38 PM  01/31/22    Please see attending Neurologist note that accompanies this mid-level encounter note.

## 2022-01-31 NOTE — Progress Notes (Signed)
Situation:   January 31, 2022   Patient information: [name], [name], 66 y.o., female,   Hospital day 5  Admission Diagnosis: Stiff person syndrome [G25.82]           Current Discharge Plan : CM contacted to pt's daughter Morrell Riddle # [phone], she would like to return back with [facility] when patient is ready to discharge.    Francee Piccolo  RN Case Manager I

## 2022-01-31 NOTE — Progress Notes (Signed)
[facility] HOSPITALIST PROGRESS NOTE    Date Time: 01/31/22 10:43 AM  Patient Name: [name],[name]  Attending Physician: Les Pouhaudhry, Mellie Buccellato, MD        History of Presenting Illness and Interval History/24 hour Events:   HPI per Admitting Provider  " [name] BirksCarol [name] is a 66 y.o. female hx afib on Eliquis, HTN, HLD, DM, COPD, TIAs, COPD, bipolar disorder, [facility] admit 2/7-2/17/23 for rhabdomyolysis (peak CK 4464 - statin d/c'ed) + vomiting ?gastroparesis + starvation ketoacidosis + UTI, [facility] admit 2/18-2/20/23 for rhabdomyolysis (peak CK 2509) + transaminitis + bilateral leg weakness + right paresthesias, admit 3/4-3/13 for bilateral hand/leg weakness and paresthesias  (unclear etiology, suspect myositis s/p IVIG but had facial paresthesias so it was stopped) + leg muscle edema on MRI (no biopsy done) + non-ischemic cardiomyopathy (EF 43%) who presents to the hospital with ongoing paraparesis. [facility] and her hand strength improved somewhat but she is still paraparetic. She was [facility] today but his neurologist Dr. 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 These symptoms are sudden onset, severe intensity, without alleviating factors.  "    Subjective 4/6: NAE. Patient reports no new focal complaints. Continues to show improvement with weakens, but sill reports limb stiffness.      Subjective 4/7: NAE. Patient reports improved mobility today after starting valium yesterday.    Subjective 4/8: NAE. Patient without focal complaints, tolerated PLEX yesterday  Subjective 4/9: Reports improved mobility in LE.   Subjective 4/10: Receiving PLEX #2 this AM. NO focal compaints, sleepy   Physical Exam:     Vitals:    01/31/22 1015   BP:    Pulse:    Resp:    Temp:    SpO2: 98%       Intake  and Output Summary (Last 24 hours) at Date Time  No intake or output data in the 24 hours ending 01/31/22 1043      General: asleep but arousable.    HEENT: NC/AT.  MMM  Neck: supple  Cardiovascular: regular rate and rhythm, no murmurs, rubs or gallops  Lungs: clear to auscultation bilaterally, without wheezing, rhonchi, or rales  Abdomen: soft, non-tender, non-distended; normoactive bowel sounds, no rebound or guarding  Extremities: increased tone LLE>RLE.  (Overall LLE tone improved)  Neuro: cranial nerves grossly intact, strength 5/5 in upper and 3/5 lower extremities, sensation intact except paraesthesias in hands/feet  Skin: no rashes or lesions noted      Medications:     Current Facility-Administered Medications   Medication Dose Route Frequency    apixaban  5 mg Oral Q12H SCH    budesonide-formoterol  2 puff Inhalation BID    diazePAM  5 mg Oral Q8H SCH    heparin (porcine)  5,000 Units Intracatheter Once    heparin (porcine)  5,000 Units Intracatheter Once    lurasidone  120 mg Oral QHS    metoprolol succinate XL  37.5 mg Oral Q12H    midodrine  10 mg Oral BID Meals    pantoprazole  40 mg Oral QAM    polyethylene glycol  17 g Oral Daily    rOPINIRole  0.5 mg Oral QHS    senna-docusate  2 tablet Oral QHS    tamsulosin  0.4 mg Oral Daily  thiamine  100 mg Oral Daily    vitamin B-6  50 mg Oral Daily    vitamin D  25 mcg Oral Daily    vitamins/minerals  1 tablet Oral Daily    zinc sulfate  220 mg Oral Daily         Labs:     Results       Procedure Component Value Units Date/Time    Basic Metabolic Panel [[id number]]  (Abnormal) Collected: 01/31/22 0501    Specimen: Blood Updated: 01/31/22 0629     Glucose 108 mg/dL      BUN 5.0 mg/dL      Creatinine 0.5 mg/dL      Calcium 8.6 mg/dL      Sodium 409 mEq/L      Potassium 3.6 mEq/L      Chloride 106 mEq/L      CO2 26 mEq/L      Anion Gap 8.0    GFR [[id number]] Collected: 01/31/22 0501     Updated: 01/31/22 0629     EGFR >60.0    CBC and differential [[id number]]   (Abnormal) Collected: 01/31/22 0501    Specimen: Blood Updated: 01/31/22 0555     WBC 8.92 x10 3/uL      Hgb 8.2 g/dL      Hematocrit 57.8 %      Platelets 322 x10 3/uL      RBC 3.11 x10 6/uL      MCV 81.4 fL      MCH 26.4 pg      MCHC 32.4 g/dL      RDW 18 %      MPV 9.4 fL      Instrument Absolute Neutrophil Count 5.73 x10 3/uL      Neutrophils 64.3 %      Lymphocytes Automated 26.7 %      Monocytes 6.2 %      Eosinophils Automated 2.1 %      Basophils Automated 0.3 %      Immature Granulocytes 0.4 %      Nucleated RBC 0.0 /100 WBC      Neutrophils Absolute 5.73 x10 3/uL      Lymphocytes Absolute Automated 2.38 x10 3/uL      Monocytes Absolute Automated 0.55 x10 3/uL      Eosinophils Absolute Automated 0.19 x10 3/uL      Basophils Absolute Automated 0.03 x10 3/uL      Immature Granulocytes Absolute 0.04 x10 3/uL      Absolute NRBC 0.00 x10 3/uL     Calcium, ionized [[id number]] Collected: 01/31/22 0129    Specimen: Blood Updated: 01/31/22 0158     Calcium, Ionized 2.50 mEq/L     Narrative:      Pre-procedure if AM lab not done  [place] [id number] at 01/31/2022 01:19 Reason: Specimen drawn in   proximity to IV site.    Fibrinogen [[id number]] Collected: 01/31/22 0117    Specimen: Blood Updated: 01/31/22 0156     Fibrinogen 236 mg/dL     Narrative:      Pre-procedure, If AM lab not done  Pre-procedure if AM lab not done    GFR [[id number]] Collected: 01/31/22 0117     Updated: 01/31/22 0153     EGFR >60.0    Narrative:      Pre-procedure, If AM lab not done  Pre-procedure if AM lab not done    Basic Metabolic Panel [[id number]]  (Abnormal) Collected: 01/31/22 0117    Specimen: Blood Updated:  01/31/22 0153     Glucose 108 mg/dL      BUN 6.0 mg/dL      Creatinine 0.5 mg/dL      Calcium 8.6 mg/dL      Sodium 161 mEq/L      Potassium 3.3 mEq/L      Chloride 108 mEq/L      CO2 24 mEq/L      Anion Gap 8.0    Narrative:      Pre-procedure, If AM lab not done  Pre-procedure if AM lab not done    CBC without differential  [[id number]]  (Abnormal) Collected: 01/31/22 0117    Specimen: Blood Updated: 01/31/22 0146     WBC 8.79 x10 3/uL      Hgb 7.8 g/dL      Hematocrit 81.1 %      Platelets 282 x10 3/uL      RBC 3.02 x10 6/uL      MCV 80.8 fL      MCH 25.8 pg      MCHC 32.0 g/dL      RDW 18 %      MPV 8.8 fL      Nucleated RBC 0.0 /100 WBC      Absolute NRBC 0.00 x10 3/uL     Narrative:      Pre-procedure, If AM lab not done  Pre-procedure if AM lab not done    Urine culture [[id number]] Collected: 01/28/22 1424    Specimen: Bladder Updated: 01/30/22 1526    Narrative:      ORDER#: [id number]                                    ORDERED BY: Delbert Phenix, Karishma Unrein  SOURCE: Urine                                        COLLECTED:  01/28/22 14:24  ANTIBIOTICS AT COLL.:                                RECEIVED :  01/28/22 14:36  Culture Urine                              FINAL       01/30/22 15:26   +  01/30/22   >100,000 CFU/ML Escherichia coli      _____________________________________________________________________________                                     E.coli       ANTIBIOTICS                     MIC  INTRP      _____________________________________________________________________________  Amoxicillin/CA                 >16/8   R        Ampicillin                      >16    R  D1    Ampicillin/sulbactam           >16/8  R        Aztreonam                       <=2    S        Cefazolin                       >16    R  D2    Cefepime                        <=1    S        Cefoxitin                       16     I        Ceftazidime                     <=2    S        Ceftriaxone                     <=1    S  D3    Cefuroxime                      16     I        Ciprofloxacin                 <=0.25   S        Ertapenem                     <=0.25   S        Gentamicin                      <=2    S        Levofloxacin                   <=0.5   S        Meropenem                      <=0.5   S        Nitrofurantoin                 <=16    S  D4     Piperacillin/Tazobactam         8/4    S        Tetracycline                    <=2    S        Trimethoprim/Sulfamethoxazole <=0.5/9  S          -----DRUG COMMENTS----------    D1:  If oral therapy is desired AND ampicillin is susceptible,         consider amoxicillin.         [place] Antimicrobial Subcommittee Nov. 2020    D2:  Cefazolin interpretation is for uncomplicated UTI only.         If oral therapy is desired for uncomplicated UTI AND E.coli is         susceptible to cefazolin, consider cephalexin, cefdinir,         cefpodoxime, or cefprozil.  [place] Antimicrobial Subcommittee Nov. 2020    D3:  Ceftriaxone does not predict cefdinir susceptibility.         [place] Antimicrobial Subcommittee Nov. 2020    D4:  Nitrofurantoin should only be used for the treatment of         uncomplicated cystitis.         [facility] [facility] Antimicrobial Subcommittee June 2015  _____________________________________________________________________________            S=SUSCEPTIBLE     I=INTERMEDIATE     R=RESISTANT       N/S=NON-SUSCEPTIBLE     SDD=SUSCEPTIBLE-DOSE DEPENDENT  _____________________________________________________________________________                    @A1c @    Radiology:     Radiology Results (24 Hour)       ** No results found for the last 24 hours. **            Lines  Patient Lines/Drains/Airways Status       Active PICC Line / CVC Line / PIV Line / Drain / Airway / Intraosseous Line / Epidural Line / ART Line / Line / Wound / Pressure Ulcer / NG/OG Tube       Name Placement date Placement time Site Days    Urethral Catheter Non-latex;Straight-tip 16 Fr. 01/23/22  1457  Non-latex;Straight-tip  3    Wound 01/03/22 Stage 1 Coccyx;Sacrum unblanchable redness 01/03/22  2200  Coccyx;Sacrum  23                  Urethral Catheter Non-latex;Straight-tip 16 Fr. (Active)   Catheter necessity reviewed? Yes 01/27/22 1100   Site Assessment WDL 01/27/22 0800   Pericare (With Urinary Catheter) Yes 01/27/22 1100    Collection Container Standard drainage bag with urimeter 01/27/22 1100   Securement Method Securement device 01/27/22 1100   Reason for Continuing Urinary Catheterization past POD 1 Acute urinary retention due to nerve injury 01/27/22 0800   Positioned catheter tubing for unobstructed urine flow: Yes 01/27/22 1100   Urine Output (mL) 300 mL 01/26/22 1500   Urine Output (mL) Total 300 mL 01/26/22 1500   Number of days: 3         Assessment:     Patient Active Problem List   Diagnosis    CVA (cerebral vascular accident)    Chest pain    Chest pain with high risk of acute coronary syndrome    Paroxysmal atrial fibrillation    Hypertension    Hyperlipidemia    Seizure disorder    History of gastrointestinal hemorrhage    Depression    Anemia    Asthma    Non-traumatic subcutaneous emphysema    Chest pain with moderate risk for cardiac etiology    Left-sided weakness    History of asthma    History of transient ischemic attack (TIA)    Type 2 diabetes mellitus    Syncope and collapse    Weakness of both legs    Hypokalemia    Thoracic back pain    Gastroparesis    Guillain Barr syndrome    Myositis    Stiff person syndrome     66 y.o. female hx afib on Eliquis, HTN, HLD, DM, COPD, TIAs, COPD, bipolar disorder, [facility] admit 2/7-2/17/23 for rhabdomyolysis (peak CK 4464 - statin d/c'ed) + vomiting ?gastroparesis + starvation ketoacidosis + UTI, [facility] admit 2/18-2/20/23 for rhabdomyolysis (peak CK 2509) + transaminitis + bilateral leg weakness + right paresthesias,  admit 3/4-3/13 for bilateral hand/leg weakness and paresthesias  (unclear etiology, suspect myositis s/p IVIG but had facial paresthesias so it was stopped) + leg muscle edema on MRI (no biopsy done) + non-ischemic cardiomyopathy (EF 43%) who presents with ongoing paraparesis.     Plan:   #Neuro: Weakness, Stiffness, Ambulatory Dysfunction for 6 weeks.  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 Hand weakness appears improved but  paraparesis persists.  -Neurology following  -s/p [place] Catheter.  PLEX #2 of 3 today. (F/M/W)  -Benzodiazepine trial. Cont Valium 5mg  q8h.   -Routine Cancer screening as an outpatient.  -Neuro checks.   -PT/OT.     #Anemia of chronic disease  -Iron profile reviewed, B12 within normal limits  -Hemoglobin remains stable on Eliquis    UA likely colonization, no signs/symptoms c/f UTI  -Hold abx    Maintain Foley catheter for retenton (4/7).   -Failed multiple voiding trials     Hx afib on Eliquis,   -Metop for rate control; increase to 37.5mg  XL BID  -Cont Eliquis    #HTN, HLD, DM (A1c 5.3 on 12/2021), COPD, TIAs, COPD, bipolar disorder, [facility] admit 2/7-2/17/23 for rhabdomyolysis (peak CK 4464 - statin d/c'ed) + vomiting ?gastroparesis + starvation ketoacidosis + UTI, [facility] admit 2/18-2/20/23 for rhabdomyolysis (peak CK 2509) + transaminitis + bilateral leg weakness + right paresthesias, admit 3/4-3/13 for bilateral hand/leg weakness and paresthesias  (unclear etiology, suspect myositis s/p IVIG but had facial paresthesias so it was stopped) + leg muscle edema on MRI (no biopsy done) + non-ischemic cardiomyopathy (EF 43%)      DVT/GI prophylaxis - SCDs. Eliquis BID                     Recent Labs     01/31/22  0501 01/31/22  0117 01/30/22  0503 01/29/22  0428   Potassium 3.6 3.3* 4.0 3.3*       Diagnosis: Hypokalemia        Signed by: Les Pou, MD, MD, MBA  cc:Malcolm Metro, MD

## 2022-01-31 NOTE — Procedures (Signed)
Tolerated TPE TX, Called Dr. Julianne Handler and authorize to initiate TPE TX. Aseptically accessed RT CVC IJ, no s/s of infections, both ports are patent. Albumin 5 % and Calcium Gluconate was administered, No reactions from the Citrate, stable VSS. Rinse back done,flushed both ports with NSS, clamped and change new caps. Dressing is intact,dry and clean, no complications of TPE TX .  Safety is implemented by monitoring VSS every 15 minutes plus the connections from the machine. No complains within the entire TX. Post TPE Tx report was given to [facility]. Call on bed side.

## 2022-01-31 NOTE — Plan of Care (Signed)
Problem: Neurological Deficit  Goal: Neurological status is stable or improving  Outcome: Progressing  Flowsheets (Taken 01/31/2022 1657)  Neurological status is stable or improving:   Monitor/assess/document neurological assessment (Stroke: every 4 hours)   Perform CAM Assessment   Observe for seizure activity and initiate seizure precautions if indicated     Problem: Impaired Mobility  Goal: Mobility/Activity is maintained at optimal level for patient  Outcome: Progressing  Flowsheets (Taken 01/31/2022 1657)  Mobility/activity is maintained at optimal level for patient:   Increase mobility as tolerated/progressive mobility   Encourage independent activity per ability   Maintain proper body alignment   Perform active/passive ROM   Plan activities to conserve energy, plan rest periods   Reposition patient every 2 hours and as needed unless able to reposition self   Assess for changes in respiratory status, level of consciousness and/or development of fatigue   Consult/collaborate with Physical Therapy and/or Occupational Therapy     Problem: Anxiety  Goal: Anxiety is at a manageable level  Outcome: Progressing  Flowsheets (Taken 01/31/2022 1657)  Anxiety is at a manageable level:   Orient to unit   Inform/explain to patient/patient care companion all tests/procedures/treatment/care prior to initiation   Facilitate expression of feelings, fears, concerns, anxiety   Assess emotional status and coping mechanisms   Provide alternatives to reduce anxiety   Provide and maintain a safe environment   Limit or eliminate stimulants such as caffeine and nicotine   Consult/collaborate with ancillary departments   Include patient/patient care companion in decisions related to anxiety/depression   Provide emotional support   Assist in developing anxiety-reducing skills   Encourage participation in care           NURSING PROGRESS NOTE: STROKE UNIT    Patient Name: [name] (66 y.o. female)  Admission Date: 01/26/2022 [facility] Day  5)      Recent Labs   Lab 01/31/22  0501   Sodium 140   Potassium 3.6   Chloride 106   CO2 26   BUN 5.0*   Creatinine 0.5   EGFR >60.0   Glucose 108*   Calcium 8.6       Recent Labs   Lab 01/31/22  0501   WBC 8.92   Hgb 8.2*   Hematocrit 25.3*   Platelets 322         Patient Lines/Drains/Airways Status       Active Lines, Drains and Airways       Name Placement date Placement time Site Days    Temporary Catheter - Non-Tunneled 01/27/22 Internal Jugular Right 01/27/22  1525  -- 4    Midline IV 01/27/22 Anterior;Left Upper Arm 01/27/22  1738  Upper Arm  3    Urethral Catheter Non-latex 14 Fr. 01/28/22  1428  Non-latex  3                       Braden Scale Score: 17 (01/31/22 0800)      Skin Integrity: Bruising, Scars, Non-blanchable Redness  Bruising Skin Location: scattered    Wound 01/03/22 Stage 1 Coccyx;Sacrum unblanchable redness (Active)   Date First Assessed/Time First Assessed: 01/03/22 2200   Pressure Injury Staging (WOCN/ Trained RNs Only): Stage 1  Location: Coccyx;Sacrum  Wound Description (Comments): unblanchable redness  Present on Admission: Yes      Assessments 01/03/2022 11:00 PM 01/31/2022  8:00 AM   Site Description Clean;Dry;Intact Dry;Intact   Peri-wound Description Clean;Dry Clean;Dry;Intact   Closure Open to air  Open to air   Treatments Site care --   Dressing Foam --   Dressing Changed New --   Dressing Status Clean;Dry;Intact --       No associated orders.       Puncture Site 01/27/22 Venous Anterior;Right Neck (Active)   Date First Assessed/Time First Assessed: 01/27/22 1518   Puncture site location: Venous  Wound Location Orientation: Anterior;Right  Location: Neck      Assessments 01/27/2022  3:28 PM 01/31/2022  8:00 AM   Site Assessment Clean;Dry;Intact;Soft;Non Tender Clean;Dry;Intact   Dressing Status Dry;Intact Clean;Dry;Intact   Dressing Changed New --   Drainage Amount None Scant       No associated orders.       Safety Checklist   1:1 Sitter N    Avasys N       Interpreter Services:  Does  the patient require an Interpreter? N    If yes, what form of interpreter services was used? NA     If family was utilized, is interpreter waiver form signed and in the chart? NA      ASSESSMENT/PLAN:    Last BM: 4/8    Pending Orders: PLEX treatment 3 out of 3 on Wed 4/12    Discharge Plan: SNF     POC / Family Update: none     Shift Note: Patient A&O x 4, speech slurred, follows commands. Tingling to hands and feet. MAE, strong, unsteady gait OOB with assist x 1 and walker to BR. Foley catheter in place, care done. Pain to vaginal/periarea, PRN tylenol given x 1. PLEX treatment 2 out of 3 done this shift, c/o itchiness to tongue, PRN IV benadryl given x 1.   Fall precautions in place: bed in lowest position, call light within reach, fall mat in place, bed alarm on.     PPE worn in patient's room: gloves, surgical mask

## 2022-01-31 NOTE — Progress Notes (Signed)
[facility]       Music Therapy Services   [phone]    Music Therapy Session Note      Patient: [name]    MRN#: [id number]     [id number]    Referred by: Unit Charge RN/Unit Supervisor    Patient is agreeable to participation in the therapy session.    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 She was asked if she would like to engage with the tx in the music therapy session. She agreed and shared that she likes gospel music. Tx proceeded to play through multiple different gospel songs that were uplifting and encouraging for the pt. The pt engaged in mildly talking to the tx but mostly engaged in just listening with her eyes closed. She expressed enjoyment of the music near the end of the session and eventually asked for the session to come to a close. Tx thanked the pt for her time and left shortly after.     Patient's Response to Treatment: Pt presented with a flat affect throughout the entire session but commented on what songs and artists she would like to hear for today. She expressed enjoyment and stated words of praise during the songs she related to most. Near the end of the session, she requested the session come to a close. Tx thanked the pt for her time and willingness. Tx left shortly after the last song.     Pre-session pain level:     Post-session pain level:     Pre-session anxiety level:    Post-session anxiety level:     Plan: Continue with Music Therapy service to address engagement/relaxation.     Cira Rue, MMT, MT-BC, NMT

## 2022-02-01 LAB — CBC AND DIFFERENTIAL
Absolute NRBC: 0 10*3/uL (ref 0.00–0.00)
Basophils Absolute Automated: 0.04 10*3/uL (ref 0.00–0.08)
Basophils Automated: 0.5 %
Eosinophils Absolute Automated: 0.33 10*3/uL (ref 0.00–0.44)
Eosinophils Automated: 4.5 %
Hematocrit: 24 % — ABNORMAL LOW (ref 34.7–43.7)
Hgb: 7.7 g/dL — ABNORMAL LOW (ref 11.4–14.8)
Immature Granulocytes Absolute: 0.03 10*3/uL (ref 0.00–0.07)
Immature Granulocytes: 0.4 %
Instrument Absolute Neutrophil Count: 4.21 10*3/uL (ref 1.10–6.33)
Lymphocytes Absolute Automated: 2.3 10*3/uL (ref 0.42–3.22)
Lymphocytes Automated: 31.2 %
MCH: 25.9 pg (ref 25.1–33.5)
MCHC: 32.1 g/dL (ref 31.5–35.8)
MCV: 80.8 fL (ref 78.0–96.0)
MPV: 9.5 fL (ref 8.9–12.5)
Monocytes Absolute Automated: 0.47 10*3/uL (ref 0.21–0.85)
Monocytes: 6.4 %
Neutrophils Absolute: 4.21 10*3/uL (ref 1.10–6.33)
Neutrophils: 57 %
Nucleated RBC: 0 /100 WBC (ref 0.0–0.0)
Platelets: 292 10*3/uL (ref 142–346)
RBC: 2.97 10*6/uL — ABNORMAL LOW (ref 3.90–5.10)
RDW: 18 % — ABNORMAL HIGH (ref 11–15)
WBC: 7.38 10*3/uL (ref 3.10–9.50)

## 2022-02-01 LAB — BASIC METABOLIC PANEL
Anion Gap: 6 (ref 5.0–15.0)
BUN: 5 mg/dL — ABNORMAL LOW (ref 7.0–21.0)
CO2: 22 mEq/L (ref 17–29)
Calcium: 8.5 mg/dL (ref 8.5–10.5)
Chloride: 112 mEq/L — ABNORMAL HIGH (ref 99–111)
Creatinine: 0.5 mg/dL (ref 0.4–1.0)
Glucose: 102 mg/dL — ABNORMAL HIGH (ref 70–100)
Potassium: 3.2 mEq/L — ABNORMAL LOW (ref 3.5–5.3)
Sodium: 140 mEq/L (ref 135–145)

## 2022-02-01 LAB — MAGNESIUM: Magnesium: 1.7 mg/dL (ref 1.6–2.6)

## 2022-02-01 LAB — GFR: EGFR: >60

## 2022-02-01 MED ORDER — DIPHENHYDRAMINE HCL 25 MG PO CAPS
25.0000 mg | ORAL_CAPSULE | Freq: Four times a day (QID) | ORAL | Status: DC | PRN
Start: 2022-02-01 — End: 2022-02-08
  Administered 2022-02-03: 25 mg via ORAL
  Filled 2022-02-01 (×3): qty 1

## 2022-02-01 MED ORDER — DIPHENHYDRAMINE HCL 50 MG/ML IJ SOLN
12.5000 mg | Freq: Four times a day (QID) | INTRAMUSCULAR | Status: DC | PRN
Start: 2022-02-01 — End: 2022-02-08
  Administered 2022-02-01 – 2022-02-08 (×12): 12.5 mg via INTRAVENOUS
  Filled 2022-02-01 (×11): qty 1

## 2022-02-01 NOTE — PT Progress Note (Signed)
Physical Therapy Treatment  [name]   Post Acute Care Therapy Recommendations:   Discharge Recommendations: SNF    If SNF recommended discharge disposition is not available, patient will need hands on assist for mobility and ADLs and HHPT.     DME needs IF patient discharges home: Front wheel walker, Wheelchair-manual, [facility]    Therapy discharge recommendations may change with patient status.  Please refer to most recent note for up-to-date recommendations.  Assessment:   Significant Findings: HR 125 with activity, 114 at rest    Pt approached for skilled PT tx with RN consent, received seated sinkside with OT, agreeable to participate. Pt demonstrated improving gait quality, however, LEs continue to fatigue quickly. Amb 54ft, 56ft, 21ft with min A and RW. Slow, short reciprocal gait. Seated rests between walks. Gait smoother, no knee bouncing noted. 1 episode of B knee buckling during last walk, mod A from PT required to recover. Pt will continue to benefit from skilled PT services to address strength, balance and endurace impairments in order to maximize functional independence.      Assessment: Decreased LE strength, Decreased safety/judgement during functional mobility, Decreased endurance/activity tolerance, Decreased sensation, Impaired coordination, Impaired motor control, Decreased functional mobility, Decreased balance, Gait impairment  Progress: Progressing toward goals  Prognosis: Good, With continued PT status post acute discharge  Risks/Benefits/POC Discussed with Pt/Family: With patient  Patient left without needs and call bell within reach. RN notified of session outcome.     Treatment Activities: gait training, pt education    Educated the patient to role of physical therapy, plan of care, goals of therapy and safety with mobility and ADLs.    Plan:   Treatment/Interventions: Exercise, Gait training, Neuromuscular re-education, Functional transfer training, LE strengthening/ROM, Endurance training,  Patient/family training, Equipment eval/education, Compensatory technique education        PT Frequency: 3-4x/wk     Continue plan of care.     Unit: [facility] TOWER 6 EAST  Bed: F613/F613.01    Precautions and Contraindications:   Precautions  Weight Bearing Status: no restrictions  Other Precautions: falls    Updated Medical Status/Imaging/Labs: Reviewed    Subjective: "I'm tired."   Patient Goal: to get better    Pain Assessment  Pain Assessment: No/denies pain    Patient's medical condition is appropriate for Physical Therapy intervention at this time.  Patient is agreeable to participation in the therapy session. Nursing clears patient for therapy.    Objective:   Observation of Patient/Vital Signs:  Patient is in bed with peripheral IV in place.  Pt wore mask during therapy session:No      Cognition/Neuro Status  Arousal/Alertness: Appropriate responses to stimuli  Attention Span: Appears intact  Orientation Level: Oriented X4  Following Commands: Follows all commands and directions without difficulty  Behavior: calm;cooperative         Functional Mobility:  Sit to Supine: Stand by Assist  Sit to Stand: Minimal Assist;with instruction for hand placement to increase safety  Stand to Sit: Minimal Assist  Transfers  Bed to Chair: Minimal Assist (Stand step with RW, knee buckling 2' fatigue)    Ambulation:  PMP - Progressive Mobility Protocol   PMP Activity: Step 7 - Walks out of Room  Distance Walked (ft) (Step 6,7): 25 Feet     Ambulation: Minimal Assist;with front-wheeled walker  Pattern: shuffle;decreased cadence;decreased step length  Patient Participation: good  Patient Endurance: fair    Patient left with call bell within reach, all needs met, SCDs off, fall mat in place, bed alarm on and all questions answered. RN notified of session outcome and patient response.     Goals:  Goals  Goal Formulation: With patient  Time for Goal Acheivement: 7 visits  Pt Will Perform Sit  to Stand: with contact guard assist  Pt Will Transfer Bed/Chair: with rolling walker, with contact guard assist  Pt Will Ambulate: 31-50 feet, with rolling walker, with contact guard assist  Pt Will Propel Wheelchair: > 150 feet, independent    PPE worn during session: procedural mask and gloves    Tech present: none  PPE worn by tech: N/A    Leavy CellaErica Jenay Morici, PT, DPT Pager # [phone] 02/01/2022 4:54 PM       Time of Treatment  PT Received On: 02/01/22  Start Time: 1540  Stop Time: 1605  Time Calculation (min): 25 min  Treatment # 2 out of 7 visits

## 2022-02-01 NOTE — Plan of Care (Signed)
Problem: Neurological Deficit  Goal: Neurological status is stable or improving  Outcome: Progressing  Flowsheets (Taken 02/01/2022 1531)  Neurological status is stable or improving:   Monitor/assess/document neurological assessment (Stroke: every 4 hours)   Perform CAM Assessment     Problem: Peripheral Neurovascular Impairment  Goal: Extremity color, movement, sensation are maintained or improved  Outcome: Progressing  Flowsheets (Taken 02/01/2022 1531)  Extremity color, movement, sensation are maintained or improved:   Increase mobility as tolerated/progressive mobility   Assess and monitor application of corrective devices (cast, brace, splint), check skin integrity   Assess extremity for proper alignment   Teach/review/reinforce ankle pump exercises   VTE Prevention: Administer anticoagulant(s) and/or apply anti-embolism stockings/devices as ordered     Problem: Impaired Mobility  Goal: Mobility/Activity is maintained at optimal level for patient  Outcome: Progressing  Flowsheets (Taken 02/01/2022 1531)  Mobility/activity is maintained at optimal level for patient:   Increase mobility as tolerated/progressive mobility   Encourage independent activity per ability   Maintain proper body alignment   Perform active/passive ROM   Plan activities to conserve energy, plan rest periods   Reposition patient every 2 hours and as needed unless able to reposition self   Assess for changes in respiratory status, level of consciousness and/or development of fatigue   Consult/collaborate with Physical Therapy and/or Occupational Therapy           NURSING PROGRESS NOTE: STROKE UNIT    Patient Name: [name] (66 y.o. female)  Admission Date: 01/26/2022 [facility] Day 6)      Recent Labs   Lab 02/01/22  0446   Sodium 140   Potassium 3.2*   Chloride 112*   CO2 22   BUN 5.0*   Creatinine 0.5   EGFR >60.0   Glucose 102*   Calcium 8.5       Recent Labs   Lab 02/01/22  0446   WBC 7.38   Hgb 7.7*   Hematocrit 24.0*   Platelets 292          Patient Lines/Drains/Airways Status       Active Lines, Drains and Airways       Name Placement date Placement time Site Days    Temporary Catheter - Non-Tunneled 01/27/22 Internal Jugular Right 01/27/22  1525  -- 5    Midline IV 01/27/22 Anterior;Left Upper Arm 01/27/22  1738  Upper Arm  4    Urethral Catheter Non-latex 14 Fr. 01/28/22  1428  Non-latex  4                       Braden Scale Score: 18 (02/01/22 0800)      Skin Integrity: Bruising, Scars  Bruising Skin Location: scattered    Wound 01/03/22 Stage 1 Coccyx;Sacrum unblanchable redness (Active)   Date First Assessed/Time First Assessed: 01/03/22 2200   Pressure Injury Staging (WOCN/ Trained RNs Only): Stage 1  Location: Coccyx;Sacrum  Wound Description (Comments): unblanchable redness  Present on Admission: Yes      Assessments 01/03/2022 11:00 PM 02/01/2022  8:00 AM   Site Description Clean;Dry;Intact Clean;Dry;Intact   Peri-wound Description Clean;Dry Clean;Dry;Intact   Closure Open to air --   Treatments Site care --   Dressing Foam --   Dressing Changed New --   Dressing Status Clean;Dry;Intact --       No associated orders.       Puncture Site 01/27/22 Venous Anterior;Right Neck (Active)   Date First Assessed/Time First Assessed: 01/27/22 1518  Puncture site location: Venous  Wound Location Orientation: Anterior;Right  Location: Neck      Assessments 01/27/2022  3:28 PM 02/01/2022  8:00 AM   Site Assessment Clean;Dry;Intact;Soft;Non Tender Clean;Dry;Intact   Dressing Status Dry;Intact Clean;Dry;Intact   Dressing Changed New --   Drainage Amount None --       No associated orders.       Safety Checklist   1:1 Sitter N    Avasys N       Interpreter Services:  Does the patient require an Interpreter? N    If yes, what form of interpreter services was used? NA     If family was utilized, is interpreter waiver form signed and in the chart? NA      ASSESSMENT/PLAN:    Last BM: 4/11    Pending Orders: PLEX treatment 3 out of 3 on Wed 4/12    Discharge  Plan: SNF     POC / Family Update: none     Shift Note: Patient A&O x 4, speech slurred, follows commands. Tingling to hands and feet. MAE, strong, unsteady gait, OOB with assist x 1 and walker to BR. Foley catheter in place, care done, continent to BM. Pain to neck, PRN tylenol given x 1. C/o itchiness to tongue after morning medications, PRN IV benadryl given x 1.   Fall precautions in place: bed in lowest position, call light within reach, fall mat in place, bed alarm on.       PPE worn in patient's room: gloves, surgical mask

## 2022-02-01 NOTE — Progress Notes (Signed)
[facility] HOSPITALIST PROGRESS NOTE    Date Time: 02/01/22 11:13 AM  Patient Name: [name],[name]  Attending Physician: Les Pou, MD        History of Presenting Illness and Interval History/24 hour Events:   HPI per Admitting Provider  " [name] is a 66 y.o. female hx afib on Eliquis, HTN, HLD, DM, COPD, TIAs, COPD, bipolar disorder, [facility] admit 2/7-2/17/23 for rhabdomyolysis (peak CK 4464 - statin d/c'ed) + vomiting ?gastroparesis + starvation ketoacidosis + UTI, [facility] admit 2/18-2/20/23 for rhabdomyolysis (peak CK 2509) + transaminitis + bilateral leg weakness + right paresthesias, admit 3/4-3/13 for bilateral hand/leg weakness and paresthesias  (unclear etiology, suspect myositis s/p IVIG but had facial paresthesias so it was stopped) + leg muscle edema on MRI (no biopsy done) + non-ischemic cardiomyopathy (EF 43%) who presents to the hospital with ongoing paraparesis. She was at [facility] rehab and her hand strength improved somewhat but she is still paraparetic. She was discharged to [facility] today but his neurologist Dr. 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 These symptoms are sudden onset, severe intensity, without alleviating factors.  "    Subjective 4/6: NAE. Patient reports no new focal complaints. Continues to show improvement with weakens, but sill reports limb stiffness.      Subjective 4/7: NAE. Patient reports improved mobility today after starting valium yesterday.    Subjective 4/8: NAE. Patient without focal complaints, tolerated PLEX yesterday  Subjective 4/9: Reports improved mobility in LE.   Subjective 4/10: Receiving PLEX #2 this AM. NO focal compaints, sleepy   Subjective 4/11: No acute events.  Patient tolerated Plex well yesterday.  Complains of drooling from  left side of her mouth past 3 to 4 days  Physical Exam:     Vitals:    02/01/22 1100   BP: 128/82   Pulse: (!) 106   Resp: 18   Temp: 98.2 F (36.8 C)   SpO2: 97%       Intake and Output Summary (Last 24 hours) at Date Time  No intake or output data in the 24 hours ending 02/01/22 1113      General: Awake, alert, oriented  HEENT: NC/AT.  MMM  Neck: supple  Cardiovascular: regular rate and rhythm, no murmurs, rubs or gallops  Lungs: clear to auscultation bilaterally, without wheezing, rhonchi, or rales  Abdomen: soft, non-tender, non-distended; normoactive bowel sounds, no rebound or guarding  Extremities: increased tone LLE>RLE.  (Overall LLE tone improved)  Neuro: Mild left facial droop, strength 5/5 in upper and 4/5 lower extremities, sensation intact except paraesthesias in hands/feet  Skin: no rashes or lesions noted      Medications:     Current Facility-Administered Medications   Medication Dose Route Frequency    apixaban  5 mg Oral Q12H SCH    budesonide-formoterol  2 puff Inhalation BID    diazePAM  5 mg Oral Q8H SCH    heparin (porcine)  5,000 Units Intracatheter Once    heparin (porcine)  5,000 Units Intracatheter Once    lurasidone  120 mg Oral QHS    metoprolol succinate XL  37.5 mg Oral Q12H    midodrine  10 mg Oral BID Meals    pantoprazole  40 mg Oral QAM    polyethylene glycol  17 g  Oral Daily    rOPINIRole  0.5 mg Oral QHS    senna-docusate  2 tablet Oral QHS    tamsulosin  0.4 mg Oral Daily    thiamine  100 mg Oral Daily    vitamin B-6  50 mg Oral Daily    vitamin D  25 mcg Oral Daily    vitamins/minerals  1 tablet Oral Daily    zinc sulfate  220 mg Oral Daily         Labs:     Results       Procedure Component Value Units Date/Time    Magnesium [[id number]] Collected: 02/01/22 0446    Specimen: Blood Updated: 02/01/22 0855     Magnesium 1.7 mg/dL     Basic Metabolic Panel [[id number]]  (Abnormal) Collected: 02/01/22 0446    Specimen: Blood Updated: 02/01/22 0634     Glucose 102 mg/dL      BUN 5.0  mg/dL      Creatinine 0.5 mg/dL      Calcium 8.5 mg/dL      Sodium 401 mEq/L      Potassium 3.2 mEq/L      Chloride 112 mEq/L      CO2 22 mEq/L      Anion Gap 6.0    GFR [[id number]] Collected: 02/01/22 0446     Updated: 02/01/22 0634     EGFR >60.0    CBC and differential [[id number]]  (Abnormal) Collected: 02/01/22 0446    Specimen: Blood Updated: 02/01/22 0606     WBC 7.38 x10 3/uL      Hgb 7.7 g/dL      Hematocrit 56.3 %      Platelets 292 x10 3/uL      RBC 2.97 x10 6/uL      MCV 80.8 fL      MCH 25.9 pg      MCHC 32.1 g/dL      RDW 18 %      MPV 9.5 fL      Instrument Absolute Neutrophil Count 4.21 x10 3/uL      Neutrophils 57.0 %      Lymphocytes Automated 31.2 %      Monocytes 6.4 %      Eosinophils Automated 4.5 %      Basophils Automated 0.5 %      Immature Granulocytes 0.4 %      Nucleated RBC 0.0 /100 WBC      Neutrophils Absolute 4.21 x10 3/uL      Lymphocytes Absolute Automated 2.30 x10 3/uL      Monocytes Absolute Automated 0.47 x10 3/uL      Eosinophils Absolute Automated 0.33 x10 3/uL      Basophils Absolute Automated 0.04 x10 3/uL      Immature Granulocytes Absolute 0.03 x10 3/uL      Absolute NRBC 0.00 x10 3/uL                   @A1c @    Radiology:     Radiology Results (24 Hour)       ** No results found for the last 24 hours. **            Lines  Patient Lines/Drains/Airways Status       Active PICC Line / CVC Line / PIV Line / Drain / Airway / Intraosseous Line / Epidural Line / ART Line / Line / Wound / Pressure Ulcer / NG/OG Tube       Name Placement date  Placement time Site Days    Urethral Catheter Non-latex;Straight-tip 16 Fr. 01/23/22  1457  Non-latex;Straight-tip  3    Wound 01/03/22 Stage 1 Coccyx;Sacrum unblanchable redness 01/03/22  2200  Coccyx;Sacrum  23                  Urethral Catheter Non-latex;Straight-tip 16 Fr. (Active)   Catheter necessity reviewed? Yes 01/27/22 1100   Site Assessment WDL 01/27/22 0800   Pericare (With Urinary Catheter) Yes 01/27/22 1100   Collection Container  Standard drainage bag with urimeter 01/27/22 1100   Securement Method Securement device 01/27/22 1100   Reason for Continuing Urinary Catheterization past POD 1 Acute urinary retention due to nerve injury 01/27/22 0800   Positioned catheter tubing for unobstructed urine flow: Yes 01/27/22 1100   Urine Output (mL) 300 mL 01/26/22 1500   Urine Output (mL) Total 300 mL 01/26/22 1500   Number of days: 3         Assessment:     Patient Active Problem List   Diagnosis    CVA (cerebral vascular accident)    Chest pain    Chest pain with high risk of acute coronary syndrome    Paroxysmal atrial fibrillation    Hypertension    Hyperlipidemia    Seizure disorder    History of gastrointestinal hemorrhage    Depression    Anemia    Asthma    Non-traumatic subcutaneous emphysema    Chest pain with moderate risk for cardiac etiology    Left-sided weakness    History of asthma    History of transient ischemic attack (TIA)    Type 2 diabetes mellitus    Syncope and collapse    Weakness of both legs    Hypokalemia    Thoracic back pain    Gastroparesis    Guillain Barr syndrome    Myositis    Stiff person syndrome     66 y.o. female hx afib on Eliquis, HTN, HLD, DM, COPD, TIAs, COPD, bipolar disorder, [facility] admit 2/7-2/17/23 for rhabdomyolysis (peak CK 4464 - statin d/c'ed) + vomiting ?gastroparesis + starvation ketoacidosis + UTI, [facility] admit 2/18-2/20/23 for rhabdomyolysis (peak CK 2509) + transaminitis + bilateral leg weakness + right paresthesias, admit 3/4-3/13 for bilateral hand/leg weakness and paresthesias  (unclear etiology, suspect myositis s/p IVIG but had facial paresthesias so it was stopped) + leg muscle edema on MRI (no biopsy done) + non-ischemic cardiomyopathy (EF 43%) who presents with ongoing paraparesis.     Plan:   #Neuro: Weakness, Stiffness, Ambulatory Dysfunction for 6 weeks.  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 Hand weakness appears improved but paraparesis  persists.  -Neurology following  -s/p [place] Catheter.  PLEX #2 of 3. (F/M/W).  Last session on 4/12  -Benzodiazepine trial. Cont Valium 5mg  q8h.   -Routine Cancer screening as an outpatient.  -Neuro checks.   -PT/OT.     #Anemia of chronic disease  -Iron profile reviewed, B12 within normal limits  -Hemoglobin remains stable on Eliquis    UA likely colonization, no signs/symptoms c/f UTI  -Hold abx    Maintain Foley catheter for retenton (4/7).   -Failed multiple voiding trials     Hx afib on Eliquis,   -Metop for rate control; increase to 37.5mg  XL BID  -Cont Eliquis    #HTN, HLD, DM (A1c 5.3 on 12/2021), COPD, TIAs, COPD, bipolar disorder, [facility] admit 2/7-2/17/23 for rhabdomyolysis (peak CK 4464 - statin d/c'ed) + vomiting ?gastroparesis +  starvation ketoacidosis + UTI, [facility] admit 2/18-2/20/23 for rhabdomyolysis (peak CK 2509) + transaminitis + bilateral leg weakness + right paresthesias, admit 3/4-3/13 for bilateral hand/leg weakness and paresthesias  (unclear etiology, suspect myositis s/p IVIG but had facial paresthesias so it was stopped) + leg muscle edema on MRI (no biopsy done) + non-ischemic cardiomyopathy (EF 43%)      DVT/GI prophylaxis - SCDs. Eliquis BID                       Recent Labs     02/01/22  0446 01/31/22  0501 01/31/22  0117 01/30/22  0503   Potassium 3.2* 3.6 3.3* 4.0     -Replete per electrolyte protocol  Diagnosis: Hypokalemia        Signed by: Les Pou, MD, MD, MBA  cc:Malcolm Metro, MD

## 2022-02-01 NOTE — Plan of Care (Addendum)
Problem: Moderate/High Fall Risk Score >5  Goal: Patient will remain free of falls  Flowsheets (Taken 01/31/2022 2000)  High (Greater than 13):   LOW-Fall Interventions Appropriate for Low Fall Risk   LOW-Anticoagulation education for injury risk   HIGH-Consider use of low bed   HIGH-Initiate use of floor mats as appropriate   HIGH-Apply yellow "Fall Risk" arm band   HIGH-Bed alarm on at all times while patient in bed   HIGH-Utilize chair pad alarm for patient while in the chair   HIGH-Visual cue at entrance to patient's room   MOD-Place Fall Risk level on whiteboard in room   MOD-Request PT/OT consult order for patients with gait/mobility impairment   MOD-Perform dangle, stand, walk (DSW) prior to mobilization   MOD-Use of assistive devices -Bedside Commode if appropriate   MOD-Remain with patient during toileting   MOD-Use of chair-pad alarm when appropriate     Problem: Compromised Tissue integrity  Goal: Damaged tissue is healing and protected  Flowsheets (Taken 02/01/2022 0233)  Damaged tissue is healing and protected:   Monitor/assess Braden scale every shift   Provide wound care per wound care algorithm   Increase activity as tolerated/progressive mobility   Relieve pressure to bony prominences for patients at moderate and high risk   Reposition patient every 2 hours and as needed unless able to reposition self   Keep intact skin clean and dry   Use bath wipes, not soap and water, for daily bathing   Use incontinence wipes for cleaning urine, stool and caustic drainage. Foley care as needed   Monitor external devices/tubes for correct placement to prevent pressure, friction and shearing   Encourage use of lotion/moisturizer on skin   Monitor patient's hygiene practices  Goal: Nutritional status is improving  Flowsheets (Taken 02/01/2022 0233)  Nutritional status is improving:   Assist patient with eating   Allow adequate time for meals   Encourage patient to take dietary supplement(s) as ordered   Collaborate  with Clinical Nutritionist   Include patient/patient care companion in decisions related to nutrition     Problem: Neurological Deficit  Goal: Neurological status is stable or improving  Flowsheets (Taken 02/01/2022 0233)  Neurological status is stable or improving:   Monitor/assess/document neurological assessment (Stroke: every 4 hours)   Observe for seizure activity and initiate seizure precautions if indicated   Perform CAM Assessment     Problem: Peripheral Neurovascular Impairment  Goal: Extremity color, movement, sensation are maintained or improved  Flowsheets (Taken 02/01/2022 0233)  Extremity color, movement, sensation are maintained or improved:   Increase mobility as tolerated/progressive mobility   Assess and monitor application of corrective devices (cast, brace, splint), check skin integrity   Assess extremity for proper alignment   Teach/review/reinforce ankle pump exercises   VTE Prevention: Administer anticoagulant(s) and/or apply anti-embolism stockings/devices as ordered     Problem: Impaired Mobility  Goal: Mobility/Activity is maintained at optimal level for patient  Flowsheets (Taken 02/01/2022 0233)  Mobility/activity is maintained at optimal level for patient:   Encourage independent activity per ability   Plan activities to conserve energy, plan rest periods   Maintain proper body alignment   Perform active/passive ROM   Increase mobility as tolerated/progressive mobility   Assess for changes in respiratory status, level of consciousness and/or development of fatigue   Reposition patient every 2 hours and as needed unless able to reposition self   Consult/collaborate with Physical Therapy and/or Occupational Therapy     Problem: Anxiety  Goal: Anxiety  is at a manageable level  Flowsheets (Taken 02/01/2022 0233)  Anxiety is at a manageable level:   Inform/explain to patient/patient care companion all tests/procedures/treatment/care prior to initiation   Facilitate expression of feelings, fears,  concerns, anxiety   Provide and maintain a safe environment   Provide emotional support   Include patient/patient care companion in decisions related to anxiety/depression   Encourage participation in care    NURSING PROGRESS NOTE: STROKE UNIT    Patient Name: [name] BirksCarol [name] ([age] y.o. female)  Admission Date: 01/26/2022 [facility](Hospital Day 6)      Recent Labs   Lab 01/31/22  0501   Sodium 140   Potassium 3.6   Chloride 106   CO2 26   BUN 5.0*   Creatinine 0.5   EGFR >60.0   Glucose 108*   Calcium 8.6       Recent Labs   Lab 01/31/22  0501   WBC 8.92   Hgb 8.2*   Hematocrit 25.3*   Platelets 322         Patient Lines/Drains/Airways Status       Active Lines, Drains and Airways       Name Placement date Placement time Site Days    Temporary Catheter - Non-Tunneled 01/27/22 Internal Jugular Right 01/27/22  1525  -- 4    Midline IV 01/27/22 Anterior;Left Upper Arm 01/27/22  1738  Upper Arm  4    Urethral Catheter Non-latex 14 Fr. 01/28/22  1428  Non-latex  3                       Braden Scale Score: 17 (01/31/22 2000)      Skin Integrity: Bruising, Scars  Bruising Skin Location: scattered    Wound 01/03/22 Stage 1 Coccyx;Sacrum unblanchable redness (Active)   Date First Assessed/Time First Assessed: 01/03/22 2200   Pressure Injury Staging (WOCN/ Trained RNs Only): Stage 1  Location: Coccyx;Sacrum  Wound Description (Comments): unblanchable redness  Present on Admission: Yes      Assessments 01/03/2022 11:00 PM 01/31/2022  8:00 AM   Site Description Clean;Dry;Intact Dry;Intact   Peri-wound Description Clean;Dry Clean;Dry;Intact   Closure Open to air Open to air   Treatments Site care --   Dressing Foam --   Dressing Changed New --   Dressing Status Clean;Dry;Intact --       No associated orders.       Puncture Site 01/27/22 Venous Anterior;Right Neck (Active)   Date First Assessed/Time First Assessed: 01/27/22 1518   Puncture site location: Venous  Wound Location Orientation: Anterior;Right  Location: Neck      Assessments 01/27/2022   3:28 PM 01/31/2022  8:00 PM   Site Assessment Clean;Dry;Intact;Soft;Non Tender Clean;Dry;Intact   Dressing Status Dry;Intact Clean;Dry;Intact   Dressing Changed New --   Drainage Amount None None       No associated orders.       Safety Checklist   1:1 Sitter N    Avasys N       Interpreter Services:  Does the patient require an Interpreter? N    If yes, what form of interpreter services was used? N/A     If family was utilized, is interpreter waiver form signed and in the chart? N/A      ASSESSMENT/PLAN:    Last BM: PTA    Pending Orders: PLEX #3 on Wed 4/12    Discharge Plan: snf    POC / Family Update: patient    Shift  Note:  Pt is A&Ox4, follows commands, clear/weak speech. Increased drowsiness/sedated, help PM valium dose per instructions. tingling/numb/decreased to BL hands and legs. MAE, overcomes resistance, gen weakness. Assist x1-2 to [facility]. RA, dim. No tele. C/o generalized pain, PRN tylenol x1 with good effect. R IJ clear/dry/intact, no leakage. Midline dressing clean/dry/intact. Continent, foley care done. Reg diet, thins, pills whole. Fall precautions in place.     PPE worn in patient's room: mask and gloves

## 2022-02-01 NOTE — OT Progress Note (Signed)
Occupational Therapy Treatment  [name]        Post Acute Care Therapy Recommendations:     Discharge Recommendations:  SNF    If SNF  recommended discharge disposition is not available, patient will need hands on assist for ADLs, functional mobility, transfers, and HHOT.     DME needs IF patient is discharging home: No additional equipment/DME recommended at this time, Patient already has needed equipment    Therapy discharge recommendations may change with patient status.  Please refer to most recent note for up-to-date recommendations.    Assessment:   Significant Findings: HR 114 at rest and increased to 125 with activity. RN aware.     RN approved pt for therapy and pt semi-supine in bed upon OT arrival. Pt completed bed mobility with SPV for safety and increased time. Pt ambulated short household distances with the RW x 3 and completed oral care in standing with Min A, with one instance of Mod A due to lateral LOB to the left. Provided education on importance of maintaining unilateral UE support on a surface and self-monitoring in order to self-implement rest breaks for overall safety during functional tasks. Frequent, lengthy seated rest breaks provided throughout with mod cues for implementation of rest breaks. Pt continues to demonstrate decreased strength, decreased balance, decreased endurance, decreased activity tolerance, and decreased coordination, which impacts pt's overall safety and independence with functional t/f, ADLs, and IADLs. Pt is functioning below PLOF and will benefit from continued acute OT services to maximize independence and safety with ADLs and functional transfers/mobility.      Treatment Activities: ADL retraining, functional t/f training, endurance, pt education, dynamic balance     Educated the patient to role of occupational therapy, plan of care, goals of therapy and safety with mobility and ADLs, home safety.    Plan:    OT Frequency Recommended: 3-4x/wk     Continue plan  of care.    Unit: [facility] TOWER 6 EAST  Bed: F613/F613.01       Precautions and Contraindications:   Fall   Bleeding  Monitor HR    Updated Medical Status/Imaging/Labs:  Reviewed     Subjective: "I am feeling pretty good today."    Patient's medical condition is appropriate for Occupational Therapy intervention at this time.  Patient is agreeable to participation in the therapy session. Nursing clears patient for therapy.    Pain:   Scale: No pain reported       Objective:   Patient is in bed with temporary catheter, midline, foley catheter, and peripheral IV in place.  Pt wore mask during therapy session:No      Cognition  Pt alert and oriented x 4. Pt able to follow multi-step commands. Note decreased self-monitoring during tasks, as pt requires cueing to implement rest breaks for decreased fall risk.     Functional Mobility  Rolling:  SPV  Supine to Sit: SPV  Sit to Stand: CGA-Min A  Transfers: CGA-Min A    PMP Activity: Step 7 - Walks out of Room    Balance  Static Sitting: Good  Static Standing: Good-     Self Care and Home Management  Eating: SPV  Grooming: Min A/Mod A standing at the sink. One instance of LOB to the left without UE support on the sink   Bathing: Mod A, seated EOB  UE Dressing: Min A, seated EOB  LE Dressing: Mod A, seated EOB, assist for foley management  Toileting: Max  A     Therapeutic Exercises  With activity    Participation: Good  Endurance: Fair+    Patient left with call bell within reach, all needs met, SCDs off as found, fall mat in mat, bed alarm on, chair alarm N/A and all questions answered. RN notified of session outcome and patient response.     Goals:  Time For Goal Achievement: 7 visits  ADL Goals  Patient will groom self: Risk analyst, at sinkside, 7 visits  Patient will dress lower body: Minimal Assist, 7 visits  Pt will complete bathing: Minimal Assist, 7 visits  Patient will toilet: Minimal Assist, 7 visits  Mobility and Transfer Goals  Pt will  perform functional transfers: Contact Guard Assist, 7 visits (w/ LRD)  Neuro Re-Ed Goals  Pt will perform dynamic sitting balance: Supervision, to increase ability to complete ADLs, 7 visits           Vision Goals  Pt with diplopia will complete ADLs with adaptive equipment: Discontinued (comment)            PPE worn during session: procedural mask and gloves    Tech present: Session overlapped with PT to maximize therapeutic benefit and due to decreased pt activity tolerance.   PPE worn by tech: N/A    Raliegh Ip, OTR/L   Pager #: [phone]     Time of Treatment  OT Received On: 02/01/22  Start Time: 1515  Stop Time: 1540  Time Calculation (min): 25 min    Treatment # 2 of 7 visits

## 2022-02-02 ENCOUNTER — Other Ambulatory Visit: Payer: Self-pay

## 2022-02-02 ENCOUNTER — Ambulatory Visit: Payer: Medicare Other

## 2022-02-02 LAB — CBC AND DIFFERENTIAL
Absolute NRBC: 0 10*3/uL (ref 0.00–0.00)
Basophils Absolute Automated: 0.05 10*3/uL (ref 0.00–0.08)
Basophils Automated: 0.6 %
Eosinophils Absolute Automated: 0.31 10*3/uL (ref 0.00–0.44)
Eosinophils Automated: 3.5 %
Hematocrit: 24.7 % — ABNORMAL LOW (ref 34.7–43.7)
Hgb: 8 g/dL — ABNORMAL LOW (ref 11.4–14.8)
Immature Granulocytes Absolute: 0.04 10*3/uL (ref 0.00–0.07)
Immature Granulocytes: 0.5 %
Instrument Absolute Neutrophil Count: 4.98 10*3/uL (ref 1.10–6.33)
Lymphocytes Absolute Automated: 2.74 10*3/uL (ref 0.42–3.22)
Lymphocytes Automated: 31.3 %
MCH: 26.4 pg (ref 25.1–33.5)
MCHC: 32.4 g/dL (ref 31.5–35.8)
MCV: 81.5 fL (ref 78.0–96.0)
MPV: 9.4 fL (ref 8.9–12.5)
Monocytes Absolute Automated: 0.63 10*3/uL (ref 0.21–0.85)
Monocytes: 7.2 %
Neutrophils Absolute: 4.98 10*3/uL (ref 1.10–6.33)
Neutrophils: 56.9 %
Nucleated RBC: 0 /100 WBC (ref 0.0–0.0)
Platelets: 311 10*3/uL (ref 142–346)
RBC: 3.03 10*6/uL — ABNORMAL LOW (ref 3.90–5.10)
RDW: 18 % — ABNORMAL HIGH (ref 11–15)
WBC: 8.75 10*3/uL (ref 3.10–9.50)

## 2022-02-02 LAB — GFR
EGFR: >60
EGFR: >60

## 2022-02-02 LAB — BASIC METABOLIC PANEL
Anion Gap: 7 (ref 5.0–15.0)
Anion Gap: 7 (ref 5.0–15.0)
BUN: 5 mg/dL — ABNORMAL LOW (ref 7.0–21.0)
BUN: 5 mg/dL — ABNORMAL LOW (ref 7.0–21.0)
CO2: 23 mEq/L (ref 17–29)
CO2: 23 mEq/L (ref 17–29)
Calcium: 8.5 mg/dL (ref 8.5–10.5)
Calcium: 8.8 mg/dL (ref 8.5–10.5)
Chloride: 110 mEq/L (ref 99–111)
Chloride: 110 mEq/L (ref 99–111)
Creatinine: 0.5 mg/dL (ref 0.4–1.0)
Creatinine: 0.5 mg/dL (ref 0.4–1.0)
Glucose: 89 mg/dL (ref 70–100)
Glucose: 93 mg/dL (ref 70–100)
Potassium: 3.6 mEq/L (ref 3.5–5.3)
Potassium: 3.7 mEq/L (ref 3.5–5.3)
Sodium: 140 mEq/L (ref 135–145)
Sodium: 140 mEq/L (ref 135–145)

## 2022-02-02 LAB — CALCIUM, IONIZED: Calcium, Ionized: 2.53 mEq/L (ref 2.30–2.58)

## 2022-02-02 LAB — FIBRINOGEN: Fibrinogen: 169 mg/dL — ABNORMAL LOW (ref 181–413)

## 2022-02-02 MED ORDER — CALCIUM CARBONATE ANTACID 500 MG PO CHEW
1000.0000 mg | CHEWABLE_TABLET | Freq: Four times a day (QID) | ORAL | Status: DC | PRN
Start: 2022-02-02 — End: 2022-02-08

## 2022-02-02 MED ORDER — LORAZEPAM 0.5 MG PO TABS
1.0000 mg | ORAL_TABLET | Freq: Once | ORAL | Status: AC | PRN
Start: 2022-02-02 — End: 2022-02-03
  Administered 2022-02-03: 1 mg via ORAL
  Filled 2022-02-02: qty 2

## 2022-02-02 MED ORDER — ALTEPLASE 2 MG IJ SOLR
2.0000 mg | INTRAMUSCULAR | Status: DC | PRN
Start: 2022-02-02 — End: 2022-02-08

## 2022-02-02 MED ORDER — HEPARIN SODIUM (PORCINE) 5000 UNIT/ML IJ SOLN
5000.0000 [IU] | Freq: Once | INTRAMUSCULAR | Status: DC
Start: 2022-02-02 — End: 2022-02-08

## 2022-02-02 MED ORDER — DIPHENHYDRAMINE HCL 50 MG/ML IJ SOLN
25.0000 mg | Freq: Four times a day (QID) | INTRAMUSCULAR | Status: DC | PRN
Start: 2022-02-02 — End: 2022-02-08

## 2022-02-02 MED ORDER — ALBUMIN HUMAN/BIOSIMILIAR 5% IV SOLN (WRAP)
25.0000 g | INTRAVENOUS | Status: AC
Start: 2022-02-02 — End: 2022-02-02
  Administered 2022-02-02: 122.5 g via INTRAVENOUS
  Filled 2022-02-02: qty 2500

## 2022-02-02 MED ORDER — CIPROFLOXACIN HCL 500 MG PO TABS
500.0000 mg | ORAL_TABLET | Freq: Two times a day (BID) | ORAL | Status: AC
Start: 2022-02-02 — End: 2022-02-06
  Administered 2022-02-02 – 2022-02-06 (×10): 500 mg via ORAL
  Filled 2022-02-02 (×10): qty 1

## 2022-02-02 MED ORDER — CALCIUM GLUCONATE-NACL 1-0.675 GM/50ML-% IV SOLN
1.0000 g | INTRAVENOUS | Status: AC
Start: 2022-02-02 — End: 2022-02-02
  Administered 2022-02-02 (×3): 1 g via INTRAVENOUS
  Filled 2022-02-02: qty 50

## 2022-02-02 MED ORDER — ACD FORMULA A 0.73-2.45-2.2 GM/100ML VI SOLN (APHERESIS)
300.0000 mL | Status: DC
Start: 2022-02-02 — End: 2022-02-08
  Administered 2022-02-02: 300 mL via INTRAVENOUS

## 2022-02-02 NOTE — Progress Notes (Signed)
[facility] Consultation Progress Note    Date Time: 02/02/22 9:58 AM  Patient Name: [facility]  Outpatient Neurologist :    CC: LE weakness, paresthesia      Assessment:   66 yo F with PMHx of afib (on eliquis), HTN, HLD, DM, COPD, TIA, bipolar d/o, recent rhabdomyolysis (02/23), recent [facility] admission 3/13-01/26/22 for weakness and discharged to acute rehab 01/26/22. Pt called back to [facility] on recommendation of neurologist Dr. 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 Ongoing trial of PLEX (first session completed on 4/7). In the future low threshold for muscle biopsy (rectus femoris recommended based on EMG results). On exam, pt disoriented to name / location of hospital, which is new. Pt also with improvement in leg strength. Given new drooling, trace dysarthria, will repeat MRI brain WWO to eval for brainstem changes. Given subjective and objective improvement in leg strength and ambulation, would complete total of 5 sessions of PLEX.    -Workup on recent admission 3/13-4/5:  -CSF protein 52, WBC 1. ME panel, cx, HSV negative  -ENS2: +GAD65 Ab  -Ganglioside Ab panel neg  -Motor sensory neuropathy panel neg  -MG panel neg  -RPR, HIV nonreactive  -TSH, B12, copper,  wnl              -Anaphylactic response to IVIg (IgA 435) Lips, tongue swelling with itchiness.              -3/6 EMG showing a myopathic process affecting proximal BLE              -CT chest/abd/pelvis unremarkable              -CK 239, Vit D 10. LDH 450  -MRI femur bilat WWO 3/9: Mild edema and atrophy involving the semimembranosus and biceps femoris muscles of the right and left thighs, noted to be bilateral and symmetric in appearance.       -Workup this admission:  -CK 21        Patient Active Problem List   Diagnosis    CVA (cerebral  vascular accident)    Chest pain    Chest pain with high risk of acute coronary syndrome    Paroxysmal atrial fibrillation    Hypertension    Hyperlipidemia    Seizure disorder    History of gastrointestinal hemorrhage    Depression    Anemia    Asthma    Non-traumatic subcutaneous emphysema    Chest pain with moderate risk for cardiac etiology    Left-sided weakness    History of asthma    History of transient ischemic attack (TIA)    Type 2 diabetes mellitus    Syncope and collapse    Weakness of both legs    Hypokalemia    Thoracic back pain    Gastroparesis    Guillain Barr syndrome    Myositis    Stiff person syndrome       Plan:   -MRI brain WWO (ordered)  -Trial of PLEX x 3 sessions, #3/3 today. Would extend to 5 sessions given improvement  -Currently on valium 5mg  q8hrs for leg stiffness   -supportive measures.   Neurology Attending Note:    Total time of the encounter was 35 minutes, of which >50% was spent reviewing the chart, updating orders, coordinating care, and  at the bedside counseling. I performed the substantive portion of the visit by providing more than 50% of the total encounter time, and spent 20 minutes. Patient was also seen by  APP  who spent .  I reviewed the chart, relevant neuro-imaging and labs on the above patient.  I personally examined the patient.  I also conferred with the midlevel and agree with the findings and plan outlined above.           Kendrick Fries, M.D.  Board Certified Neurology, ABPN  Board Certified NeuroImaging, UCNS   [facility]       Interval History/Subjective:   Pt endorses improvement in leg strength and ability to walk. Pt endorses drooling off/on x 5 days. She denies diplopia. She notes her voice is "thick" yesterday and today, having difficulty getting her words out. She feels LLE stiffness and L hand / leg paresthesias are unchanged. She denies any additional new symptoms.     Medications:     Current Facility-Administered Medications   Medication  Dose Route Frequency    apixaban  5 mg Oral Q12H SCH    budesonide-formoterol  2 puff Inhalation BID    diazePAM  5 mg Oral Q8H SCH    heparin (porcine)  5,000 Units Intracatheter Once    heparin (porcine)  5,000 Units Intracatheter Once    lurasidone  120 mg Oral QHS    metoprolol succinate XL  37.5 mg Oral Q12H    midodrine  10 mg Oral BID Meals    pantoprazole  40 mg Oral QAM    polyethylene glycol  17 g Oral Daily    rOPINIRole  0.5 mg Oral QHS    senna-docusate  2 tablet Oral QHS    tamsulosin  0.4 mg Oral Daily    thiamine  100 mg Oral Daily    vitamin B-6  50 mg Oral Daily    vitamin D  25 mcg Oral Daily    vitamins/minerals  1 tablet Oral Daily    zinc sulfate  220 mg Oral Daily       Review of Systems:   Neurological ROS: negative except as noted in interval hx / subjective  Physical Exam:   Temp:  [97.6 F (36.4 C)-98.2 F (36.8 C)] 98.1 F (36.7 C)  Heart Rate:  [101-106] 102  Resp Rate:  [14-18] 17  BP: (107-128)/(64-82) 107/64    Vital Signs:  Reviewed    General: Well developed and well nourished. No acute distress. Cooperative with the exam  ENT: Normal oral mucosa, no ear or nose discharge  Neck: Symmetric, no deformities  CV: RRR  Resp: No audible wheezing, normal work of breathing  Abd: Nondistended  Skin: Intact, extremities normal in color  Psych: Affect is normal    Mental Status: The patient is awake, alert. Oriented to self, April, 2023. States "[facility]" in [place] for location  [facility] of knowledge appropriate  Attention span and concentration appear normal.  Language function is normal. There is no evidence of aphasia in conversational speech.  Trace dysarthria    Cranial nerves:   -CN II: tracking  -CN III, IV, VI: Pupils equal, round, and reactive to light; extraocular movements intact; no ptosis. Nystagmus with lateral gaze bilat. No vertical nystagmus.              -CN V: Facial sensation intact in V1 through V3 distributions   -CN VII: trace L facial droop   -CN VIII:  Hearing intact to  conversational speech   -CN IX, X: Normal phonation   -CN XI: Symmetric full strength of trapezius muscles   -CN XII: Tongue protrudes midline    Motor: Muscle tone normal without spasticity or flaccidity. Temporalis atrophy bilat, noted on prior exam but not documented.      UEs:   Deltoid Bicep Tricep Grip   Right 5 5 5 5    Left 5 5 5 5      LEs   HF KF KE PF DF   Right 2 5 5 5 5    Left 2 5 4+ 5 5     Sensory:   LT and Temp diminished left hand, left leg over shin only    Reflexes:   Plantars: flexor b/l    Coordination: No tremors    Gait: deferred      Labs:     Results       Procedure Component Value Units Date/Time    Basic Metabolic Panel [[id number]]  (Abnormal) Collected: 02/02/22 0416    Specimen: Blood Updated: 02/02/22 0615     Glucose 93 mg/dL      BUN 5.0 mg/dL      Creatinine 0.5 mg/dL      Calcium 8.8 mg/dL      Sodium 409 mEq/L      Potassium 3.6 mEq/L      Chloride 110 mEq/L      CO2 23 mEq/L      Anion Gap 7.0    GFR [[id number]] Collected: 02/02/22 0416     Updated: 02/02/22 0615     EGFR >60.0    Fibrinogen [[id number]]  (Abnormal) Collected: 02/02/22 0416    Specimen: Blood Updated: 02/02/22 0605     Fibrinogen 169 mg/dL     Narrative:      Pre-procedure, If AM lab not done  Pre-procedure if AM lab not done    Basic Metabolic Panel [[id number]]  (Abnormal) Collected: 02/02/22 0416    Specimen: Blood Updated: 02/02/22 0602     Glucose 89 mg/dL      BUN 5.0 mg/dL      Creatinine 0.5 mg/dL      Calcium 8.5 mg/dL      Sodium 528 mEq/L      Potassium 3.7 mEq/L      Chloride 110 mEq/L      CO2 23 mEq/L      Anion Gap 7.0    Narrative:      Pre-procedure, If AM lab not done  Pre-procedure if AM lab not done    GFR [[id number]] Collected: 02/02/22 0416     Updated: 02/02/22 0602     EGFR >60.0    Narrative:      Pre-procedure, If AM lab not done  Pre-procedure if AM lab not done    CBC and differential [[id number]]  (Abnormal) Collected: 02/02/22 0416    Specimen: Blood Updated: 02/02/22  0550     WBC 8.75 x10 3/uL      Hgb 8.0 g/dL      Hematocrit 03.4 %      Platelets 311 x10 3/uL      RBC 3.03 x10 6/uL      MCV 81.5 fL      MCH 26.4 pg      MCHC 32.4 g/dL      RDW 18 %      MPV 9.4 fL      Instrument Absolute Neutrophil Count 4.98 x10 3/uL      Neutrophils 56.9 %  Lymphocytes Automated 31.3 %      Monocytes 7.2 %      Eosinophils Automated 3.5 %      Basophils Automated 0.6 %      Immature Granulocytes 0.5 %      Nucleated RBC 0.0 /100 WBC      Neutrophils Absolute 4.98 x10 3/uL      Lymphocytes Absolute Automated 2.74 x10 3/uL      Monocytes Absolute Automated 0.63 x10 3/uL      Eosinophils Absolute Automated 0.31 x10 3/uL      Basophils Absolute Automated 0.05 x10 3/uL      Immature Granulocytes Absolute 0.04 x10 3/uL      Absolute NRBC 0.00 x10 3/uL     Calcium, ionized [[id number]] Collected: 02/02/22 0416    Specimen: Blood Updated: 02/02/22 0455     Calcium, Ionized 2.53 mEq/L     Narrative:      Pre-procedure if AM lab not done            Rads:   Non-Tunneled Cath Placement [facility])    Result Date: 01/28/2022  Right internal jugular approach non-tunneled ([place]) central venous catheter placement. PLAN: Catheter is ready for use. Verline Lema MD 01/28/2022 11:27 AM       Signed by:  Sarita Haver, PA PA-C   [facility]  [facility] Consult Spectra: [phone]  [facility] Consult Spectra: [phone]  Between 1630-1900, please epic chat: "[facility] General Neurology"  After 1900, call: [phone]  12:12 PM  02/02/22    Please see attending Neurologist note that accompanies this mid-level encounter note.

## 2022-02-02 NOTE — Plan of Care (Signed)
Problem: Compromised Tissue integrity  Goal: Damaged tissue is healing and protected  Outcome: Progressing  Flowsheets (Taken 02/01/2022 0233 by Kyra Leyland, RN)  Damaged tissue is healing and protected:   Monitor/assess Braden scale every shift   Provide wound care per wound care algorithm   Increase activity as tolerated/progressive mobility   Relieve pressure to bony prominences for patients at moderate and high risk   Reposition patient every 2 hours and as needed unless able to reposition self   Keep intact skin clean and dry   Use bath wipes, not soap and water, for daily bathing   Use incontinence wipes for cleaning urine, stool and caustic drainage. Foley care as needed   Monitor external devices/tubes for correct placement to prevent pressure, friction and shearing   Encourage use of lotion/moisturizer on skin   Monitor patient's hygiene practices  Goal: Nutritional status is improving  Outcome: Progressing  Flowsheets (Taken 02/01/2022 0233 by Kyra Leyland, RN)  Nutritional status is improving:   Assist patient with eating   Allow adequate time for meals   Encourage patient to take dietary supplement(s) as ordered   Collaborate with Clinical Nutritionist   Include patient/patient care companion in decisions related to nutrition     Problem: Neurological Deficit  Goal: Neurological status is stable or improving  Outcome: Progressing  Flowsheets (Taken 02/01/2022 1531 by Alveta Heimlich, RN)  Neurological status is stable or improving:   Monitor/assess/document neurological assessment (Stroke: every 4 hours)   Perform CAM Assessment     Problem: Peripheral Neurovascular Impairment  Goal: Extremity color, movement, sensation are maintained or improved  Outcome: Progressing  Flowsheets (Taken 02/01/2022 1531 by Alveta Heimlich, RN)  Extremity color, movement, sensation are maintained or improved:   Increase mobility as tolerated/progressive mobility   Assess and monitor application of corrective  devices (cast, brace, splint), check skin integrity   Assess extremity for proper alignment   Teach/review/reinforce ankle pump exercises   VTE Prevention: Administer anticoagulant(s) and/or apply anti-embolism stockings/devices as ordered     Problem: Impaired Mobility  Goal: Mobility/Activity is maintained at optimal level for patient  Outcome: Progressing  Flowsheets (Taken 02/01/2022 1531 by Alveta Heimlich, RN)  Mobility/activity is maintained at optimal level for patient:   Increase mobility as tolerated/progressive mobility   Encourage independent activity per ability   Maintain proper body alignment   Perform active/passive ROM   Plan activities to conserve energy, plan rest periods   Reposition patient every 2 hours and as needed unless able to reposition self   Assess for changes in respiratory status, level of consciousness and/or development of fatigue   Consult/collaborate with Physical Therapy and/or Occupational Therapy   NURSING PROGRESS NOTE: STROKE UNIT    Patient Name: [name] (66 y.o. female)  Admission Date: 01/26/2022 [facility] Day 7)      Recent Labs   Lab 02/02/22  0416   Sodium 140  140   Potassium 3.6  3.7   Chloride 110  110   CO2 23  23   BUN 5.0*  5.0*   Creatinine 0.5  0.5   EGFR >60.0  >60.0   Glucose 93  89   Calcium 8.8  8.5       Recent Labs   Lab 02/02/22  0416   WBC 8.75   Hgb 8.0*   Hematocrit 24.7*   Platelets 311         Patient Lines/Drains/Airways Status       Active Lines, Drains and Airways  Name Placement date Placement time Site Days    Temporary Catheter - Non-Tunneled 01/27/22 Internal Jugular Right 01/27/22  1525  -- 6    Midline IV 01/27/22 Anterior;Left Upper Arm 01/27/22  1738  Upper Arm  5    Urethral Catheter Non-latex 14 Fr. 01/28/22  1428  Non-latex  5                       Braden Scale Score: 19 (02/02/22 0800)      Skin Integrity: Scars  Bruising Skin Location: scattered    Wound 01/03/22 Stage 1 Coccyx;Sacrum unblanchable redness (Active)    Date First Assessed/Time First Assessed: 01/03/22 2200   Pressure Injury Staging (WOCN/ Trained RNs Only): Stage 1  Location: Coccyx;Sacrum  Wound Description (Comments): unblanchable redness  Present on Admission: Yes      Assessments 01/03/2022 11:00 PM 02/02/2022  8:00 AM   Site Description Clean;Dry;Intact Intact   Peri-wound Description Clean;Dry --   Closure Open to air --   Treatments Site care --   Dressing Foam --   Dressing Changed New --   Dressing Status Clean;Dry;Intact --       No associated orders.       Puncture Site 01/27/22 Venous Anterior;Right Neck (Active)   Date First Assessed/Time First Assessed: 01/27/22 1518   Puncture site location: Venous  Wound Location Orientation: Anterior;Right  Location: Neck      Assessments 01/27/2022  3:28 PM 02/01/2022  8:00 PM   Site Assessment Clean;Dry;Intact;Soft;Non Tender Dry;Intact;Clean   Dressing Status Dry;Intact Clean;Dry;Intact   Dressing Changed New --   Drainage Amount None None       No associated orders.       ASSESSMENT/PLAN:    Last BM: 02/02/22    Pending Orders: Apheresis, MRI brain    Discharge Plan: SNF    POC / Family Update: none     Shift Note: A/Ox4, FC, clear speech. MAE-gen weakness in legs. Assistx1 to bathroom. VSS. RA. No tele.  Continent x2.  Reg diet/thins.    Apheresis #3 done today.

## 2022-02-02 NOTE — Procedures (Signed)
Tolerated the 3rd TPE Apheresis tx. Stable VSS, no complains of pains, no reactions from the citrate. Aseptically accessed the RT IJ CVC, both ports are patent, no s/s of infections, dry and inatct skin, dressing is dated and signed. Tx was initiated without a complications. Albumin 5% and Calcium Gluconate was administered during tx. Dr. Julianne Handler was notified for the patient's tolerating tx very well. No complains within the entire TPE TX. Rinse back done, flushed both ports with NSS, clamped and change new caps. No Post complications, post tx report was given to [facility]. Safety was implemented by monitoring VSS every 15 minutes and checking connections with the machines.

## 2022-02-02 NOTE — Progress Notes (Signed)
[facility] HOSPITALIST PROGRESS NOTE    Date Time: 02/02/22 10:36 AM  Patient Name: [name] BirksSLADE,[place]  Attending Physician: Durene Romansinh, Laverle Pillard T, DO        History of Presenting Illness and Interval History/24 hour Events:   HPI per Admitting Provider  " [name] BirksCarol Sabey is a 66 y.o. female hx afib on Eliquis, HTN, HLD, DM, COPD, TIAs, COPD, bipolar disorder, [facility] admit 2/7-2/17/23 for rhabdomyolysis (peak CK 4464 - statin d/c'ed) + vomiting ?gastroparesis + starvation ketoacidosis + UTI, [facility] admit 2/18-2/20/23 for rhabdomyolysis (peak CK 2509) + transaminitis + bilateral leg weakness + right paresthesias, admit 3/4-3/13 for bilateral hand/leg weakness and paresthesias  (unclear etiology, suspect myositis s/p IVIG but had facial paresthesias so it was stopped) + leg muscle edema on MRI (no biopsy done) + non-ischemic cardiomyopathy (EF 43%) who presents to the hospital with ongoing paraparesis. [facility] at [facility] and her hand strength improved somewhat but she is still paraparetic. [facility] but his neurologist Dr. 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 These symptoms are sudden onset, severe intensity, without alleviating factors.  "    Subjective 4/6: NAE. Patient reports no new focal complaints. Continues to show improvement with weakens, but sill reports limb stiffness.      Subjective 4/7: NAE. Patient reports improved mobility today after starting valium yesterday.    Subjective 4/8: NAE. Patient without focal complaints, tolerated PLEX yesterday  Subjective 4/9: Reports improved mobility in LE.   Subjective 4/10: Receiving PLEX #2 this AM. NO focal compaints, sleepy   Subjective 4/11: No acute events.  Patient tolerated Plex well yesterday.  Complains of drooling from left  side of her mouth past 3 to 4 days  4/12:  No acute events. Pt denied any fever, chills, sob, chest pain    Physical Exam:     Vitals:    02/02/22 0824   BP: 107/64   Pulse: (!) 102   Resp:    Temp:    SpO2:        Intake and Output Summary (Last 24 hours) at Date Time    Intake/Output Summary (Last 24 hours) at 02/02/2022 1036  Last data filed at 02/02/2022 0340  Gross per 24 hour   Intake --   Output 600 ml   Net -600 ml         General: Awake, alert, oriented  HEENT: NC/AT.  MMM  Neck: supple  Cardiovascular: regular rate and rhythm, no murmurs, rubs or gallops  Lungs: clear to auscultation bilaterally, without wheezing, rhonchi, or rales  Abdomen: soft, non-tender, non-distended; normoactive bowel sounds, no rebound or guarding  Extremities: increased tone LLE>RLE.  (Overall LLE tone improved)  Neuro: Mild left facial droop, strength 5/5 in upper and 4/5 lower extremities, sensation intact except paraesthesias in hands/feet  Skin: no rashes or lesions noted      Medications:     Current Facility-Administered Medications   Medication Dose Route Frequency    apixaban  5 mg Oral Q12H SCH    budesonide-formoterol  2 puff Inhalation BID    diazePAM  5 mg Oral Q8H SCH    heparin (porcine)  5,000 Units Intracatheter Once    heparin (porcine)  5,000 Units Intracatheter Once    lurasidone  120  mg Oral QHS    metoprolol succinate XL  37.5 mg Oral Q12H    midodrine  10 mg Oral BID Meals    pantoprazole  40 mg Oral QAM    polyethylene glycol  17 g Oral Daily    rOPINIRole  0.5 mg Oral QHS    senna-docusate  2 tablet Oral QHS    tamsulosin  0.4 mg Oral Daily    thiamine  100 mg Oral Daily    vitamin B-6  50 mg Oral Daily    vitamin D  25 mcg Oral Daily    vitamins/minerals  1 tablet Oral Daily    zinc sulfate  220 mg Oral Daily         Labs:     Results       Procedure Component Value Units Date/Time    Basic Metabolic Panel [[id number]]  (Abnormal) Collected: 02/02/22 0416    Specimen: Blood Updated: 02/02/22 0615     Glucose  93 mg/dL      BUN 5.0 mg/dL      Creatinine 0.5 mg/dL      Calcium 8.8 mg/dL      Sodium 409 mEq/L      Potassium 3.6 mEq/L      Chloride 110 mEq/L      CO2 23 mEq/L      Anion Gap 7.0    GFR [[id number]] Collected: 02/02/22 0416     Updated: 02/02/22 0615     EGFR >60.0    Fibrinogen [[id number]]  (Abnormal) Collected: 02/02/22 0416    Specimen: Blood Updated: 02/02/22 0605     Fibrinogen 169 mg/dL     Narrative:      Pre-procedure, If AM lab not done  Pre-procedure if AM lab not done    Basic Metabolic Panel [[id number]]  (Abnormal) Collected: 02/02/22 0416    Specimen: Blood Updated: 02/02/22 0602     Glucose 89 mg/dL      BUN 5.0 mg/dL      Creatinine 0.5 mg/dL      Calcium 8.5 mg/dL      Sodium 528 mEq/L      Potassium 3.7 mEq/L      Chloride 110 mEq/L      CO2 23 mEq/L      Anion Gap 7.0    Narrative:      Pre-procedure, If AM lab not done  Pre-procedure if AM lab not done    GFR [[id number]] Collected: 02/02/22 0416     Updated: 02/02/22 0602     EGFR >60.0    Narrative:      Pre-procedure, If AM lab not done  Pre-procedure if AM lab not done    CBC and differential [[id number]]  (Abnormal) Collected: 02/02/22 0416    Specimen: Blood Updated: 02/02/22 0550     WBC 8.75 x10 3/uL      Hgb 8.0 g/dL      Hematocrit 03.4 %      Platelets 311 x10 3/uL      RBC 3.03 x10 6/uL      MCV 81.5 fL      MCH 26.4 pg      MCHC 32.4 g/dL      RDW 18 %      MPV 9.4 fL      Instrument Absolute Neutrophil Count 4.98 x10 3/uL      Neutrophils 56.9 %      Lymphocytes Automated 31.3 %      Monocytes 7.2 %  Eosinophils Automated 3.5 %      Basophils Automated 0.6 %      Immature Granulocytes 0.5 %      Nucleated RBC 0.0 /100 WBC      Neutrophils Absolute 4.98 x10 3/uL      Lymphocytes Absolute Automated 2.74 x10 3/uL      Monocytes Absolute Automated 0.63 x10 3/uL      Eosinophils Absolute Automated 0.31 x10 3/uL      Basophils Absolute Automated 0.05 x10 3/uL      Immature Granulocytes Absolute 0.04 x10 3/uL      Absolute NRBC 0.00  x10 3/uL     Calcium, ionized [[id number]] Collected: 02/02/22 0416    Specimen: Blood Updated: 02/02/22 0455     Calcium, Ionized 2.53 mEq/L     Narrative:      Pre-procedure if AM lab not done                  @A1c @    Radiology:     Radiology Results (24 Hour)       ** No results found for the last 24 hours. **            Lines  Patient Lines/Drains/Airways Status       Active PICC Line / CVC Line / PIV Line / Drain / Airway / Intraosseous Line / Epidural Line / ART Line / Line / Wound / Pressure Ulcer / NG/OG Tube       Name Placement date Placement time Site Days    Urethral Catheter Non-latex;Straight-tip 16 Fr. 01/23/22  1457  Non-latex;Straight-tip  3    Wound 01/03/22 Stage 1 Coccyx;Sacrum unblanchable redness 01/03/22  2200  Coccyx;Sacrum  23                  Urethral Catheter Non-latex;Straight-tip 16 Fr. (Active)   Catheter necessity reviewed? Yes 01/27/22 1100   Site Assessment WDL 01/27/22 0800   Pericare (With Urinary Catheter) Yes 01/27/22 1100   Collection Container Standard drainage bag with urimeter 01/27/22 1100   Securement Method Securement device 01/27/22 1100   Reason for Continuing Urinary Catheterization past POD 1 Acute urinary retention due to nerve injury 01/27/22 0800   Positioned catheter tubing for unobstructed urine flow: Yes 01/27/22 1100   Urine Output (mL) 300 mL 01/26/22 1500   Urine Output (mL) Total 300 mL 01/26/22 1500   Number of days: 3         Assessment:     Patient Active Problem List   Diagnosis    CVA (cerebral vascular accident)    Chest pain    Chest pain with high risk of acute coronary syndrome    Paroxysmal atrial fibrillation    Hypertension    Hyperlipidemia    Seizure disorder    History of gastrointestinal hemorrhage    Depression    Anemia    Asthma    Non-traumatic subcutaneous emphysema    Chest pain with moderate risk for cardiac etiology    Left-sided weakness    History of asthma    History of transient ischemic attack (TIA)    Type 2 diabetes mellitus     Syncope and collapse    Weakness of both legs    Hypokalemia    Thoracic back pain    Gastroparesis    Guillain Barr syndrome    Myositis    Stiff person syndrome     66 y.o. female hx afib on Eliquis, HTN, HLD,  DM, COPD, TIAs, bipolar disorder, [facility] admit 2/7-2/17/23 for rhabdomyolysis (peak CK 4464 - statin d/c'ed) + vomiting ?gastroparesis + starvation ketoacidosis + UTI, [facility] admit 2/18-2/20/23 for rhabdomyolysis (peak CK 2509) + transaminitis + bilateral leg weakness + right paresthesias, admit 3/4-3/13 for bilateral hand/leg weakness and paresthesias  (unclear etiology, suspect myositis s/p IVIG but had facial paresthesias so it was stopped) + leg muscle edema on MRI (no biopsy done) + non-ischemic cardiomyopathy (EF 43%) who presents with ongoing paraparesis with +GAD65, suspecting stiff person syndrome.   Plan:   Stiff Person Syndrome  - in setting of +GAD65 ab. Hand weakness appears improved but paraparesis persists.  -Neurology following  -s/p [place] Catheter.  PLEX #3 of 3. (F/M/W).  Last session on 4/12  -Benzodiazepine trial. Cont Valium 5mg  q8h.   -Routine Cancer screening as an outpatient.  -Neuro checks.   -PT/OT.     Anemia of chronic disease  -Iron profile reviewed, B12 within normal limits  -Hemoglobin remains stable on Eliquis    UTI  - UA showed ecoli. Due to urinary retention, will treat with Ciprofloxacin to decrease chance of complicated UTI, pyelonephritis.     Maintain Foley catheter for retenton (4/7).   -Failed multiple voiding trials     Hx afib on Eliquis,   -Metop for rate control; increase to 37.5mg  XL BID  -Cont Eliquis    HTN, HLD, DM (A1c 5.3 on 12/2021), COPD, TIAs, COPD, bipolar disorder, [facility] admit 2/7-2/17/23 for rhabdomyolysis (peak CK 4464 - statin d/c'ed) + vomiting ?gastroparesis + starvation ketoacidosis + UTI, [facility] admit 2/18-2/20/23 for rhabdomyolysis (peak CK 2509) + transaminitis + bilateral leg weakness + right paresthesias, admit 3/4-3/13 for bilateral  hand/leg weakness and paresthesias  (unclear etiology, suspect myositis s/p IVIG but had facial paresthesias so it was stopped) + leg muscle edema on MRI (no biopsy done) + non-ischemic cardiomyopathy (EF 43%)      DVT/GI prophylaxis - SCDs. Eliquis BID                         Recent Labs     02/02/22  0416 02/01/22  0446 01/31/22  0501 01/31/22  0117   Potassium 3.6  3.7 3.2* 3.6 3.3*     -Replete per electrolyte protocol  Diagnosis: Hypokalemia        Signed by: 04/02/22, DO, FACP  cc:Durene Romans, MD

## 2022-02-02 NOTE — Plan of Care (Signed)
Problem: Neurological Deficit  Goal: Neurological status is stable or improving  Flowsheets (Taken 02/01/2022 1531 by Alveta Heimlich, RN)  Neurological status is stable or improving:   Monitor/assess/document neurological assessment (Stroke: every 4 hours)   Perform CAM Assessment     Problem: Impaired Mobility  Goal: Mobility/Activity is maintained at optimal level for patient  Flowsheets (Taken 02/01/2022 1531 by Alveta Heimlich, RN)  Mobility/activity is maintained at optimal level for patient:   Increase mobility as tolerated/progressive mobility   Encourage independent activity per ability   Maintain proper body alignment   Perform active/passive ROM   Plan activities to conserve energy, plan rest periods   Reposition patient every 2 hours and as needed unless able to reposition self   Assess for changes in respiratory status, level of consciousness and/or development of fatigue   Consult/collaborate with Physical Therapy and/or Occupational Therapy   NURSING PROGRESS NOTE: STROKE UNIT    Patient Name: [name] (66 y.o. female)  Admission Date: 01/26/2022 [facility] Day 7)      Recent Labs   Lab 02/01/22  0446   Sodium 140   Potassium 3.2*   Chloride 112*   CO2 22   BUN 5.0*   Creatinine 0.5   EGFR >60.0   Glucose 102*   Calcium 8.5       Recent Labs   Lab 02/01/22  0446   WBC 7.38   Hgb 7.7*   Hematocrit 24.0*   Platelets 292         Patient Lines/Drains/Airways Status       Active Lines, Drains and Airways       Name Placement date Placement time Site Days    Temporary Catheter - Non-Tunneled 01/27/22 Internal Jugular Right 01/27/22  1525  -- 5    Midline IV 01/27/22 Anterior;Left Upper Arm 01/27/22  1738  Upper Arm  5    Urethral Catheter Non-latex 14 Fr. 01/28/22  1428  Non-latex  4                       Braden Scale Score: 18 (02/02/22 0025)      Skin Integrity: Scars  Bruising Skin Location: scattered    Wound 01/03/22 Stage 1 Coccyx;Sacrum unblanchable redness (Active)   Date First Assessed/Time First  Assessed: 01/03/22 2200   Pressure Injury Staging (WOCN/ Trained RNs Only): Stage 1  Location: Coccyx;Sacrum  Wound Description (Comments): unblanchable redness  Present on Admission: Yes      Assessments 01/03/2022 11:00 PM 02/02/2022 12:25 AM   Site Description Clean;Dry;Intact Intact   Peri-wound Description Clean;Dry Intact   Closure Open to air --   Treatments Site care --   Dressing Foam --   Dressing Changed New --   Dressing Status Clean;Dry;Intact --       No associated orders.       Puncture Site 01/27/22 Venous Anterior;Right Neck (Active)   Date First Assessed/Time First Assessed: 01/27/22 1518   Puncture site location: Venous  Wound Location Orientation: Anterior;Right  Location: Neck      Assessments 01/27/2022  3:28 PM 02/01/2022  8:00 PM   Site Assessment Clean;Dry;Intact;Soft;Non Tender Dry;Intact;Clean   Dressing Status Dry;Intact Clean;Dry;Intact   Dressing Changed New --   Drainage Amount None None       No associated orders.       Safety Checklist   1:1 Sitter N    Avasys N     ASSESSMENT/PLAN:    Last BM: 02/01/2022  Pending Orders: TPE # 3 on 02/02/22    Discharge Plan: SNF    POC / Family Update: patient    Shift Note: Assumed care from Delora Fuel RN at midnight. Patient awake,  alert, follow commands. Denies pain/ discomfort.   Call bell accessible and encouraged to use as needed. She indicated understanding. Fall precaution maintained.    PPE worn in patient's room:  gloves, mask

## 2022-02-02 NOTE — Progress Notes (Signed)
Nutritional Support Services  Nutrition Assessment    [name] 66 y.o. female   MRN: [id number]    NUTRITION RECOMMENDATIONS:    Continue Regular diet and Ensure supplements TID with meals.  Will add Magic Cups BID.  Encourage PO intakes.    -----------------------------------------------------------------------------------------------------------------                                                      Assessment Data:     Update:   Pt reports appetite is very poor but she is able to drink 2 Ensure supplements/day. Encouraged pt to try to increase intakes and drink all 3 Ensures each day.     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 PMHx of HTN, HLD, DM, COPD, TIAs, and bipolar disorder. Per chart review highest A1c was 6.0 on 05/21/19 but subsequent A1c on 06/17/19 was 5.7; now 5.3. Unclear when/why Ozempic was prescribed but it was included on a med list from Dec 2022 which likely contributed to patients lack of appetite and significant unintentional weight loss. Ozempic was discontinued during hospital admission mid March 2023.    Pt reports 40 lb weight loss in the past 3 months and a significantly decreased appetite for the past month. She states she was essentially unable to eat despite trying. In the past few days she reports her appetite has started to pick back up and she has been able to eat more normally. Pt agreeable to adding supplements while she's admitted.    Diet/Social Hx:   Orders Placed This Encounter   Procedures    Diet regular    Ensure Enlive Supplement Quantity: A. One; Flavor: Strawberry; Frequency: TID (3 times a day) with meals       ANTHROPOMETRIC  Height: 165.1 cm (5\' 5" )  Weight: 61.7 kg (136 lb)    Nutrition Focused Physical Exam (NFPE):   Head: temple region: slight depression with decrease in muscle tone/resistance (mild muscle loss - temporalis) and orbital region: slightly dark circles, somewhat hollow look, some decrease in bounce back of fat pads (mild  fat loss)  Upper Body: clavicle bone region: some protrusion of the clavicle with decrease in muscle tone/resistance (mild muscle loss - pectoralis major) and shoulder and Acromion bone region: rounded, curves at shoulder/arm juncture, feel muscle tone/resistance (no wasting observed)  Lower Body: anterior thigh and patellar region: slight depression along inner/outer thigh, patella slightly prominent, feel decrease in muscle tone/resistance in quadriceps to the patella (mild muscle loss - quadriceps) and posterior calf region: some shape to the bulb, but not well-developed, decrease in muscle tone/resistance (mild muscle loss - gastrocnemius)  GI function:  Edema:  Skin:    ESTIMATED NEEDS:      Pertinent Medications:   Thiamine, Vitamin B6, Vitamin D, MVI, Zinc   citrate dextrose Stopped (01/28/22 1608)    citrate dextrose Stopped (01/31/22 1031)       Allergies:   Allergies   Allergen Reactions    Singulair [Montelukast] Itching    Immune Globulin (Human) Itching and Facial Swelling     Pt complain of itchiness and swelling of lips and tongue. Saturation fine, bp elevated.    Myoview [Technetium-16m] Itching    Atorvastatin Other (See Comments)     Pt states she thinks it caused her to have rhabdo  Contrast [Iodinated Contrast Media]     Latex     Magnesium Chloride Itching    Nitroglycerin     Penicillins     Tetracyclines & Related        Pertinent labs:  Recent Labs   Lab 02/02/22  0416 01/31/22  0117 01/30/22  0503   Sodium 140  140  More results in Results Review 138   Potassium 3.6  3.7  More results in Results Review 4.0   Chloride 110  110  More results in Results Review 109   CO2 23  23  More results in Results Review 23   BUN 5.0*  5.0*  More results in Results Review <3.0*   Creatinine 0.5  0.5  More results in Results Review 0.5   Calcium 8.8  8.5  More results in Results Review 8.6   Albumin  --   --  3.7   Protein, Total  --   --  5.3*   Bilirubin, Total  --   --  0.5   Alkaline Phosphatase   --   --  44   ALT  --   --  8   AST (SGOT)  --   --  17   Glucose 93  89  More results in Results Review 111*   More results in Results Review = values in this interval not displayed.     Lab Results   Component Value Date    HGBA1C 5.3 12/27/2021                                                                     Nutrition Diagnosis    Moderate malnutrition related to inadequate protein energy intake in the setting of acute illness as evidenced by intake <75% of estimated energy requirements for >1 week,  7.5% weight loss in 3 months,  mild depletion of muscle loss (temporalis, pectoralis major, quadriceps, gastrocnemius).                                                               Monitoring     Monitor weight, labs, intakes, GI status and POC.      Sheli Kvon Mcilhenny,RDN  Clinical Dietitian   RD Office [phone] or available on Secure Chat

## 2022-02-03 ENCOUNTER — Other Ambulatory Visit: Payer: Self-pay

## 2022-02-03 ENCOUNTER — Inpatient Hospital Stay: Payer: 59

## 2022-02-03 LAB — CBC AND DIFFERENTIAL
Absolute NRBC: 0 10*3/uL (ref 0.00–0.00)
Basophils Absolute Automated: 0.05 10*3/uL (ref 0.00–0.08)
Basophils Automated: 0.5 %
Eosinophils Absolute Automated: 0.36 10*3/uL (ref 0.00–0.44)
Eosinophils Automated: 3.6 %
Hematocrit: 24.8 % — ABNORMAL LOW (ref 34.7–43.7)
Hgb: 8 g/dL — ABNORMAL LOW (ref 11.4–14.8)
Immature Granulocytes Absolute: 0.04 10*3/uL (ref 0.00–0.07)
Immature Granulocytes: 0.4 %
Instrument Absolute Neutrophil Count: 5.93 10*3/uL (ref 1.10–6.33)
Lymphocytes Absolute Automated: 3.19 10*3/uL (ref 0.42–3.22)
Lymphocytes Automated: 31.5 %
MCH: 26 pg (ref 25.1–33.5)
MCHC: 32.3 g/dL (ref 31.5–35.8)
MCV: 80.5 fL (ref 78.0–96.0)
MPV: 9.4 fL (ref 8.9–12.5)
Monocytes Absolute Automated: 0.57 10*3/uL (ref 0.21–0.85)
Monocytes: 5.6 %
Neutrophils Absolute: 5.93 10*3/uL (ref 1.10–6.33)
Neutrophils: 58.4 %
Nucleated RBC: 0 /100 WBC (ref 0.0–0.0)
Platelets: 307 10*3/uL (ref 142–346)
RBC: 3.08 10*6/uL — ABNORMAL LOW (ref 3.90–5.10)
RDW: 18 % — ABNORMAL HIGH (ref 11–15)
WBC: 10.14 10*3/uL — ABNORMAL HIGH (ref 3.10–9.50)

## 2022-02-03 LAB — BASIC METABOLIC PANEL
Anion Gap: 9 (ref 5.0–15.0)
BUN: 6 mg/dL — ABNORMAL LOW (ref 7.0–21.0)
CO2: 18 mEq/L (ref 17–29)
Calcium: 8.7 mg/dL (ref 8.5–10.5)
Chloride: 111 mEq/L (ref 99–111)
Creatinine: 0.5 mg/dL (ref 0.4–1.0)
Glucose: 102 mg/dL — ABNORMAL HIGH (ref 70–100)
Potassium: 3.3 mEq/L — ABNORMAL LOW (ref 3.5–5.3)
Sodium: 138 mEq/L (ref 135–145)

## 2022-02-03 LAB — GFR: EGFR: >60

## 2022-02-03 MED ORDER — GADOTERATE MEGLUMINE 7.5 MMOL/15ML IV SOLN (CLARISCAN)
0.2000 mL/kg | Freq: Once | INTRAVENOUS | Status: AC | PRN
Start: 2022-02-03 — End: 2022-02-04
  Administered 2022-02-04: 12 mL via INTRAVENOUS

## 2022-02-03 NOTE — Plan of Care (Signed)
Problem: Compromised Tissue integrity  Goal: Damaged tissue is healing and protected  Outcome: Progressing  Flowsheets (Taken 02/01/2022 0233 by Kyra Leyland, RN)  Damaged tissue is healing and protected:   Monitor/assess Braden scale every shift   Provide wound care per wound care algorithm   Increase activity as tolerated/progressive mobility   Relieve pressure to bony prominences for patients at moderate and high risk   Reposition patient every 2 hours and as needed unless able to reposition self   Keep intact skin clean and dry   Use bath wipes, not soap and water, for daily bathing   Use incontinence wipes for cleaning urine, stool and caustic drainage. Foley care as needed   Monitor external devices/tubes for correct placement to prevent pressure, friction and shearing   Encourage use of lotion/moisturizer on skin   Monitor patient's hygiene practices  Goal: Nutritional status is improving  Outcome: Progressing  Flowsheets (Taken 02/01/2022 0233 by Kyra Leyland, RN)  Nutritional status is improving:   Assist patient with eating   Allow adequate time for meals   Encourage patient to take dietary supplement(s) as ordered   Collaborate with Clinical Nutritionist   Include patient/patient care companion in decisions related to nutrition     Problem: Neurological Deficit  Goal: Neurological status is stable or improving  Outcome: Progressing  Flowsheets (Taken 02/03/2022 0017 by Kyra Leyland, RN)  Neurological status is stable or improving:   Monitor/assess/document neurological assessment (Stroke: every 4 hours)   Observe for seizure activity and initiate seizure precautions if indicated   Perform CAM Assessment     Problem: Peripheral Neurovascular Impairment  Goal: Extremity color, movement, sensation are maintained or improved  Outcome: Progressing  Flowsheets (Taken 02/03/2022 0017 by Kyra Leyland, RN)  Extremity color, movement, sensation are maintained or improved:   Increase mobility  as tolerated/progressive mobility   Assess and monitor application of corrective devices (cast, brace, splint), check skin integrity   Assess extremity for proper alignment   VTE Prevention: Administer anticoagulant(s) and/or apply anti-embolism stockings/devices as ordered   Teach/review/reinforce ankle pump exercises     Problem: Impaired Mobility  Goal: Mobility/Activity is maintained at optimal level for patient  Outcome: Progressing  Flowsheets (Taken 02/03/2022 0017 by Kyra Leyland, RN)  Mobility/activity is maintained at optimal level for patient:   Increase mobility as tolerated/progressive mobility   Encourage independent activity per ability   Plan activities to conserve energy, plan rest periods   Reposition patient every 2 hours and as needed unless able to reposition self   Perform active/passive ROM   Maintain proper body alignment   Assess for changes in respiratory status, level of consciousness and/or development of fatigue   Consult/collaborate with Physical Therapy and/or Occupational Therapy   NURSING PROGRESS NOTE: STROKE UNIT    Patient Name: [name] (66 y.o. female)  Admission Date: 01/26/2022 [facility] Day 8)      Recent Labs   Lab 02/03/22  0335   Sodium 138   Potassium 3.3*   Chloride 111   CO2 18   BUN 6.0*   Creatinine 0.5   EGFR >60.0   Glucose 102*   Calcium 8.7       Recent Labs   Lab 02/03/22  0335   WBC 10.14*   Hgb 8.0*   Hematocrit 24.8*   Platelets 307         Patient Lines/Drains/Airways Status       Active Lines, Drains and Airways  Name Placement date Placement time Site Days    Temporary Catheter - Non-Tunneled 01/27/22 Internal Jugular Right 01/27/22  1525  -- 6    Midline IV 01/27/22 Anterior;Left Upper Arm 01/27/22  1738  Upper Arm  6    Urethral Catheter Non-latex 16 Fr. 02/03/22  1200  Non-latex  less than 1                       Braden Scale Score: 19 (02/02/22 2000)      Skin Integrity: Scars, Bruising  Bruising Skin Location: scattered    Wound 01/03/22  Stage 1 Coccyx;Sacrum unblanchable redness (Active)   Date First Assessed/Time First Assessed: 01/03/22 2200   Pressure Injury Staging (WOCN/ Trained RNs Only): Stage 1  Location: Coccyx;Sacrum  Wound Description (Comments): unblanchable redness  Present on Admission: Yes      Assessments 01/03/2022 11:00 PM 02/02/2022  8:00 AM   Site Description Clean;Dry;Intact Intact   Peri-wound Description Clean;Dry --   Closure Open to air --   Treatments Site care --   Dressing Foam --   Dressing Changed New --   Dressing Status Clean;Dry;Intact --       No associated orders.       Puncture Site 01/27/22 Venous Anterior;Right Neck (Active)   Date First Assessed/Time First Assessed: 01/27/22 1518   Puncture site location: Venous  Wound Location Orientation: Anterior;Right  Location: Neck      Assessments 01/27/2022  3:28 PM 02/01/2022  8:00 PM   Site Assessment Clean;Dry;Intact;Soft;Non Tender Dry;Intact;Clean   Dressing Status Dry;Intact Clean;Dry;Intact   Dressing Changed New --   Drainage Amount None None       No associated orders.     ASSESSMENT/PLAN:     Last BM: 02/02/22     Pending Orders: MRI brain     Discharge Plan: SNF     POC / Family Update: none      Shift Note: A/Ox4, FC, clear speech. MAE-gen weakness in legs. Assistx1 to bathroom. VSS. RA. No tele.  Continent x2.  Reg diet/thins.    Failed void trial. BS at 1130 showed 398 mL, straight cath performed with foley kit-amount drained >700 mL. Foley left in place.

## 2022-02-03 NOTE — OT Progress Note (Addendum)
Occupational Therapy Treatment  [name]        Post Acute Care Therapy Recommendations:     Discharge Recommendations:  SNF    If SNF  recommended discharge disposition is not available, patient will need hands on assist for ADLs, functional mobility, transfers, and HHOT.     DME needs IF patient is discharging home: BSC, Wheelchair-manual, Front wheel walker    Therapy discharge recommendations may change with patient status.  Please refer to most recent note for up-to-date recommendations.    Assessment:   Significant Findings: Pt reported mild numbness of her tongue/lips at the end of the session, which she reports happens intermittently since her admission. RN made aware and reported she would further assess.     RN approved pt for therapy and pt semi-supine in bed upon OT arrival. Pt completed bed mobility with SPV for safety and increased time. Pt completed LB and UB dressing while seated EOB with alternating sitting/standing. Please see functional measures below for more information on pt's functional performance throughout. Pt educated on and demonstrated bilateral UE AROM exercises targeting gross grasp and serial opposition to promote bilateral UE strength and FMC to promote increased pt independence with ADLs (hand strength 3+/5 when assessed). Pt continues to demonstrate decreased strength, decreased balance, decreased endurance, decreased activity tolerance, and decreased coordination, which impacts pt's overall safety and independence with functional t/f, ADLs, and IADLs. Pt is functioning below PLOF and will benefit from continued acute OT services to maximize independence and safety with ADLs and functional transfers/mobility.      Treatment Activities: ADL retraining, functional t/f training, endurance, pt education, dynamic balance     Educated the patient to role of occupational therapy, plan of care, goals of therapy and safety with mobility and ADLs, home safety.    Plan:    OT Frequency  Recommended: 3-4x/wk     Continue plan of care.    Unit: [facility] TOWER 6 EAST  Bed: F613/F613.01       Precautions and Contraindications:   Fall   Bleeding  Monitor HR    Updated Medical Status/Imaging/Labs:  Reviewed     Subjective: "It is really hard." -Pt in reference to keeping unilateral UE support on the RW during pants management   Patient's medical condition is appropriate for Occupational Therapy intervention at this time.  Patient is agreeable to participation in the therapy session. Nursing clears patient for therapy.    Pain:   Scale: 2/10  Location: Neck  Interventions: Repositioning, rest       Objective:   Patient is in bed with temporary catheter, midline, foley catheter, and peripheral IV in place.  Pt wore mask during therapy session:No      Cognition  Pt alert and oriented x 4. Pt able to follow multi-step commands. Note decreased self-monitoring during tasks, as pt requires cueing to implement rest breaks for decreased fall risk.     Functional Mobility  Rolling:  SPV  Supine to Sit: SPV  Sit to Stand: CGA-Min A  Transfers: CGA-Min A    PMP Activity: Step 4 - Dangle at Bedside    Balance  Static Sitting: Good  Static Standing: Good-     Self Care and Home Management  Eating: SPV  Grooming: Min A/Mod A standing at the sink  Bathing: Mod A, seated EOB  UE Dressing: Min A, seated EOB, assist for management over catheter at cervical region  LE Dressing: Mod A, seated EOB, assist  for foley management and steadying during clothing management up  Toileting: Max A     Therapeutic Exercises  With activity  AROM of bilateral hands; gross grasp, serial opposition    Participation: Good  Endurance: Fair+    Patient left with call bell within reach, all needs met, SCDs off as found, fall mat in mat, bed alarm on, chair alarm N/A and all questions answered. RN notified of session outcome and patient response.     Goals:  Time For Goal Achievement: 7 visits  ADL Goals  Patient will groom  self: Risk analyst, at sinkside, 7 visits  Patient will dress lower body: Minimal Assist, 7 visits  Pt will complete bathing: Minimal Assist, 7 visits  Patient will toilet: Minimal Assist, 7 visits  Mobility and Transfer Goals  Pt will perform functional transfers: Contact Guard Assist, 7 visits (w/ LRD)  Neuro Re-Ed Goals  Pt will perform dynamic sitting balance: Supervision, to increase ability to complete ADLs, 7 visits           Vision Goals  Pt with diplopia will complete ADLs with adaptive equipment: Discontinued (comment)            PPE worn during session: procedural mask and gloves    Tech present: no  PPE worn by tech: N/A    Raliegh Ip, OTR/L   Pager #: [phone]     Time of Treatment  OT Received On: 02/03/22  Start Time: 1451  Stop Time: 1521  Time Calculation (min): 30 min    Treatment # 3 of 7 visits

## 2022-02-03 NOTE — Progress Notes (Addendum)
[facility] HOSPITALIST PROGRESS NOTE    Date Time: 02/03/22 11:38 AM  Patient Name: [name]  Attending Physician: Durene Romans, DO        History of Presenting Illness and Interval History/24 hour Events:   HPI per Admitting Provider  " [name] is a 66 y.o. female hx afib on Eliquis, HTN, HLD, DM, COPD, TIAs, COPD, bipolar disorder, [facility] admit 2/7-2/17/23 for rhabdomyolysis (peak CK 4464 - statin d/c'ed) + vomiting ?gastroparesis + starvation ketoacidosis + UTI, [facility] admit 2/18-2/20/23 for rhabdomyolysis (peak CK 2509) + transaminitis + bilateral leg weakness + right paresthesias, admit 3/4-3/13 for bilateral hand/leg weakness and paresthesias  (unclear etiology, suspect myositis s/p IVIG but had facial paresthesias so it was stopped) + leg muscle edema on MRI (no biopsy done) + non-ischemic cardiomyopathy (EF 43%) who presents to the hospital with ongoing paraparesis. She was at [facility] rehab and her hand strength improved somewhat but she is still paraparetic. She was discharged to [facility] today but his neurologist Dr. 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 These symptoms are sudden onset, severe intensity, without alleviating factors.  "    Subjective 4/6: NAE. Patient reports no new focal complaints. Continues to show improvement with weakens, but sill reports limb stiffness.      Subjective 4/7: NAE. Patient reports improved mobility today after starting valium yesterday.    Subjective 4/8: NAE. Patient without focal complaints, tolerated PLEX yesterday  Subjective 4/9: Reports improved mobility in LE.   Subjective 4/10: Receiving PLEX #2 this AM. NO focal compaints, sleepy   Subjective 4/11: No acute events.  Patient tolerated Plex well yesterday.  Complains of drooling from left  side of her mouth past 3 to 4 days  4/12:  No acute events. Pt denied any fever, chills, sob, chest pain  4/13: No acute events. Pt denied any fever, chills, sob, chest pain      Physical Exam:     Vitals:    02/03/22 1100   BP: 122/78   Pulse: (!) 102   Resp: 18   Temp: 98.1 F (36.7 C)   SpO2: 97%       Intake and Output Summary (Last 24 hours) at Date Time  No intake or output data in the 24 hours ending 02/03/22 1138      General: Awake, alert, oriented  HEENT: NC/AT.  MMM  Neck: supple  Cardiovascular: regular rate and rhythm, no murmurs, rubs or gallops  Lungs: clear to auscultation bilaterally, without wheezing, rhonchi, or rales  Abdomen: soft, non-tender, non-distended; normoactive bowel sounds, no rebound or guarding  Extremities: increased tone LLE>RLE.  (Overall LLE tone improved)  Neuro: Mild left facial droop, strength 5/5 in upper and 4/5 lower extremities, sensation intact except paraesthesias in hands/feet  Skin: no rashes or lesions noted      Medications:     Current Facility-Administered Medications   Medication Dose Route Frequency    apixaban  5 mg Oral Q12H SCH    budesonide-formoterol  2 puff Inhalation BID    ciprofloxacin  500 mg Oral Q12H SCH    diazePAM  5 mg Oral Q8H SCH    heparin (porcine)  5,000 Units Intracatheter Once    heparin (porcine)  5,000 Units Intracatheter Once    heparin (porcine)  5,000 Units Intracatheter Once    heparin (porcine)  5,000 Units Intracatheter Once    lurasidone  120 mg Oral QHS    metoprolol succinate XL  37.5 mg Oral Q12H    midodrine  10 mg Oral BID Meals    pantoprazole  40 mg Oral QAM    polyethylene glycol  17 g Oral Daily    rOPINIRole  0.5 mg Oral QHS    senna-docusate  2 tablet Oral QHS    tamsulosin  0.4 mg Oral Daily    thiamine  100 mg Oral Daily    vitamin B-6  50 mg Oral Daily    vitamin D  25 mcg Oral Daily    vitamins/minerals  1 tablet Oral Daily    zinc sulfate  220 mg Oral Daily         Labs:     Results       Procedure Component Value  Units Date/Time    Basic Metabolic Panel [[id number]]  (Abnormal) Collected: 02/03/22 0335    Specimen: Blood Updated: 02/03/22 0447     Glucose 102 mg/dL      BUN 6.0 mg/dL      Creatinine 0.5 mg/dL      Calcium 8.7 mg/dL      Sodium 409138 mEq/L      Potassium 3.3 mEq/L      Chloride 111 mEq/L      CO2 18 mEq/L      Anion Gap 9.0    GFR [[id number]][[id number]] Collected: 02/03/22 0335     Updated: 02/03/22 0447     EGFR >60.0    CBC and differential [[id number]][[id number]]  (Abnormal) Collected: 02/03/22 0335    Specimen: Blood Updated: 02/03/22 0433     WBC 10.14 x10 3/uL      Hgb 8.0 g/dL      Hematocrit 57.824.8 %      Platelets 307 x10 3/uL      RBC 3.08 x10 6/uL      MCV 80.5 fL      MCH 26.0 pg      MCHC 32.3 g/dL      RDW 18 %      MPV 9.4 fL      Instrument Absolute Neutrophil Count 5.93 x10 3/uL      Neutrophils 58.4 %      Lymphocytes Automated 31.5 %      Monocytes 5.6 %      Eosinophils Automated 3.6 %      Basophils Automated 0.5 %      Immature Granulocytes 0.4 %      Nucleated RBC 0.0 /100 WBC      Neutrophils Absolute 5.93 x10 3/uL      Lymphocytes Absolute Automated 3.19 x10 3/uL      Monocytes Absolute Automated 0.57 x10 3/uL      Eosinophils Absolute Automated 0.36 x10 3/uL      Basophils Absolute Automated 0.05 x10 3/uL      Immature Granulocytes Absolute 0.04 x10 3/uL      Absolute NRBC 0.00 x10 3/uL                   @A1c @    Radiology:     Radiology Results (24 Hour)       ** No results found for the last 24 hours. **            Lines  Patient Lines/Drains/Airways Status       Active PICC Line /  CVC Line / PIV Line / Drain / Airway / Intraosseous Line / Epidural Line / ART Line / Line / Wound / Pressure Ulcer / NG/OG Tube       Name Placement date Placement time Site Days    Urethral Catheter Non-latex;Straight-tip 16 Fr. 01/23/22  1457  Non-latex;Straight-tip  3    Wound 01/03/22 Stage 1 Coccyx;Sacrum unblanchable redness 01/03/22  2200  Coccyx;Sacrum  23                  Urethral Catheter Non-latex;Straight-tip 16 Fr.  (Active)   Catheter necessity reviewed? Yes 01/27/22 1100   Site Assessment WDL 01/27/22 0800   Pericare (With Urinary Catheter) Yes 01/27/22 1100   Collection Container Standard drainage bag with urimeter 01/27/22 1100   Securement Method Securement device 01/27/22 1100   Reason for Continuing Urinary Catheterization past POD 1 Acute urinary retention due to nerve injury 01/27/22 0800   Positioned catheter tubing for unobstructed urine flow: Yes 01/27/22 1100   Urine Output (mL) 300 mL 01/26/22 1500   Urine Output (mL) Total 300 mL 01/26/22 1500   Number of days: 3         Assessment:     Patient Active Problem List   Diagnosis    CVA (cerebral vascular accident)    Chest pain    Chest pain with high risk of acute coronary syndrome    Paroxysmal atrial fibrillation    Hypertension    Hyperlipidemia    Seizure disorder    History of gastrointestinal hemorrhage    Depression    Anemia    Asthma    Non-traumatic subcutaneous emphysema    Chest pain with moderate risk for cardiac etiology    Left-sided weakness    History of asthma    History of transient ischemic attack (TIA)    Type 2 diabetes mellitus    Syncope and collapse    Weakness of both legs    Hypokalemia    Thoracic back pain    Gastroparesis    Guillain Barr syndrome    Myositis    Stiff person syndrome     66 y.o. female hx afib on Eliquis, HTN, HLD, DM, COPD, TIAs, bipolar disorder, [facility] admit 2/7-2/17/23 for rhabdomyolysis (peak CK 4464 - statin d/c'ed) + vomiting ?gastroparesis + starvation ketoacidosis + UTI, [facility] admit 2/18-2/20/23 for rhabdomyolysis (peak CK 2509) + transaminitis + bilateral leg weakness + right paresthesias, admit 3/4-3/13 for bilateral hand/leg weakness and paresthesias  (unclear etiology, suspect myositis s/p IVIG but had facial paresthesias so it was stopped) + leg muscle edema on MRI (no biopsy done) + non-ischemic cardiomyopathy (EF 43%) who presents with ongoing paraparesis with +GAD65, suspecting stiff person  syndrome.   Plan:   Stiff Person Syndrome  - in setting of +GAD65 ab. Hand weakness appears improved but paraparesis persists.  -Neurology following  -s/p Quinton Catheter.  S/p PLEX 3 sessions. May extend PLEX To 5 sessions per neuro since neuro exam improves. Repeat brain MRI   -Benzodiazepine trial. Cont Valium 5mg  q8h.   -Routine Cancer screening as an outpatient.  -Neuro checks.   -PT/OT.     Anemia of chronic disease  -Iron profile reviewed, B12 within normal limits  -Hemoglobin remains stable on Eliquis    UTI  - UA showed ecoli. Due to urinary retention, cw Ciprofloxacin to decrease chance of complicated UTI, pyelonephritis.     Maintain Foley catheter for retention (4/13).   -Failed multiple voiding trials  Hx afib on Eliquis,   -Metop for rate control; increase to 37.5mg  XL BID  -Cont Eliquis    HTN, HLD, DM (A1c 5.3 on 12/2021), COPD, TIAs, COPD, bipolar disorder, [facility] admit 2/7-2/17/23 for rhabdomyolysis (peak CK 4464 - statin d/c'ed) + vomiting ?gastroparesis + starvation ketoacidosis + UTI, [facility] admit 2/18-2/20/23 for rhabdomyolysis (peak CK 2509) + transaminitis + bilateral leg weakness + right paresthesias, admit 3/4-3/13 for bilateral hand/leg weakness and paresthesias  (unclear etiology, suspect myositis s/p IVIG but had facial paresthesias so it was stopped) + leg muscle edema on MRI (no biopsy done) + non-ischemic cardiomyopathy (EF 43%)      DVT/GI prophylaxis - SCDs. Eliquis BID                 Recent Labs     02/03/22  0335 02/02/22  0416 02/01/22  0446   Potassium 3.3* 3.6  3.7 3.2*     -Replete per electrolyte protocol  Diagnosis: Hypokalemia        Signed by: Durene Romans, DO, FACP  cc:Malcolm Metro, MD

## 2022-02-03 NOTE — Plan of Care (Addendum)
Problem: Moderate/High Fall Risk Score >5  Goal: Patient will remain free of falls  Flowsheets (Taken 02/02/2022 2000)  High (Greater than 13):   LOW-Fall Interventions Appropriate for Low Fall Risk   LOW-Anticoagulation education for injury risk   MOD-Use of chair-pad alarm when appropriate   MOD-Remain with patient during toileting   MOD-Use of assistive devices -Bedside Commode if appropriate   MOD-Utilize diversion activities   MOD-Perform dangle, stand, walk (DSW) prior to mobilization   MOD-Request PT/OT consult order for patients with gait/mobility impairment   MOD-Include family in multidisciplinary POC discussions   MOD-Place Fall Risk level on whiteboard in room   HIGH-Visual cue at entrance to patient's room   HIGH-Bed alarm on at all times while patient in bed   HIGH-Initiate use of floor mats as appropriate   HIGH-Consider use of low bed   HIGH-Apply yellow "Fall Risk" arm band   HIGH-Utilize chair pad alarm for patient while in the chair     Problem: Compromised Tissue integrity  Goal: Damaged tissue is healing and protected  Flowsheets (Taken 02/01/2022 0233)  Damaged tissue is healing and protected:   Monitor/assess Braden scale every shift   Provide wound care per wound care algorithm   Increase activity as tolerated/progressive mobility   Relieve pressure to bony prominences for patients at moderate and high risk   Reposition patient every 2 hours and as needed unless able to reposition self   Keep intact skin clean and dry   Use bath wipes, not soap and water, for daily bathing   Use incontinence wipes for cleaning urine, stool and caustic drainage. Foley care as needed   Monitor external devices/tubes for correct placement to prevent pressure, friction and shearing   Encourage use of lotion/moisturizer on skin   Monitor patient's hygiene practices     Problem: Neurological Deficit  Goal: Neurological status is stable or improving  Flowsheets (Taken 02/03/2022 0017)  Neurological status is stable  or improving:   Monitor/assess/document neurological assessment (Stroke: every 4 hours)   Observe for seizure activity and initiate seizure precautions if indicated   Perform CAM Assessment     Problem: Peripheral Neurovascular Impairment  Goal: Extremity color, movement, sensation are maintained or improved  Flowsheets (Taken 02/03/2022 0017)  Extremity color, movement, sensation are maintained or improved:   Increase mobility as tolerated/progressive mobility   Assess and monitor application of corrective devices (cast, brace, splint), check skin integrity   Assess extremity for proper alignment   VTE Prevention: Administer anticoagulant(s) and/or apply anti-embolism stockings/devices as ordered   Teach/review/reinforce ankle pump exercises     Problem: Impaired Mobility  Goal: Mobility/Activity is maintained at optimal level for patient  Flowsheets (Taken 02/03/2022 0017)  Mobility/activity is maintained at optimal level for patient:   Increase mobility as tolerated/progressive mobility   Encourage independent activity per ability   Plan activities to conserve energy, plan rest periods   Reposition patient every 2 hours and as needed unless able to reposition self   Perform active/passive ROM   Maintain proper body alignment   Assess for changes in respiratory status, level of consciousness and/or development of fatigue   Consult/collaborate with Physical Therapy and/or Occupational Therapy     Problem: Anxiety  Goal: Anxiety is at a manageable level  Flowsheets (Taken 02/01/2022 0233)  Anxiety is at a manageable level:   Inform/explain to patient/patient care companion all tests/procedures/treatment/care prior to initiation   Facilitate expression of feelings, fears, concerns, anxiety   Provide and maintain  a safe environment   Provide emotional support   Include patient/patient care companion in decisions related to anxiety/depression   Encourage participation in care     NURSING PROGRESS NOTE: STROKE  UNIT    Patient Name: [name] (66 y.o. female)  Admission Date: 01/26/2022 [facility] Day 8)      Recent Labs   Lab 02/03/22  0335   Sodium 138   Potassium 3.3*   Chloride 111   CO2 18   BUN 6.0*   Creatinine 0.5   EGFR >60.0   Glucose 102*   Calcium 8.7       Recent Labs   Lab 02/03/22  0335   WBC 10.14*   Hgb 8.0*   Hematocrit 24.8*   Platelets 307         Patient Lines/Drains/Airways Status       Active Lines, Drains and Airways       Name Placement date Placement time Site Days    Temporary Catheter - Non-Tunneled 01/27/22 Internal Jugular Right 01/27/22  1525  -- 6    Midline IV 01/27/22 Anterior;Left Upper Arm 01/27/22  1738  Upper Arm  6                       Braden Scale Score: 19 (02/02/22 2000)      Skin Integrity: Scars, Bruising  Bruising Skin Location: scattered    Wound 01/03/22 Stage 1 Coccyx;Sacrum unblanchable redness (Active)   Date First Assessed/Time First Assessed: 01/03/22 2200   Pressure Injury Staging (WOCN/ Trained RNs Only): Stage 1  Location: Coccyx;Sacrum  Wound Description (Comments): unblanchable redness  Present on Admission: Yes      Assessments 01/03/2022 11:00 PM 02/02/2022  8:00 AM   Site Description Clean;Dry;Intact Intact   Peri-wound Description Clean;Dry --   Closure Open to air --   Treatments Site care --   Dressing Foam --   Dressing Changed New --   Dressing Status Clean;Dry;Intact --       No associated orders.       Puncture Site 01/27/22 Venous Anterior;Right Neck (Active)   Date First Assessed/Time First Assessed: 01/27/22 1518   Puncture site location: Venous  Wound Location Orientation: Anterior;Right  Location: Neck      Assessments 01/27/2022  3:28 PM 02/01/2022  8:00 PM   Site Assessment Clean;Dry;Intact;Soft;Non Tender Dry;Intact;Clean   Dressing Status Dry;Intact Clean;Dry;Intact   Dressing Changed New --   Drainage Amount None None       No associated orders.       Safety Checklist   1:1 Sitter N    Avasys N       Interpreter Services:  Does the patient require an  Interpreter? N    If yes, what form of interpreter services was used? N/A     If family was utilized, is interpreter waiver form signed and in the chart? N/A      ASSESSMENT/PLAN:    Last BM: 4/13    Pending Orders: MRI B    Discharge Plan: SNF    POC / Family Update: Patient    Shift Note: Pt is A&Ox4, follows commands, clear/weak speech. Tingling BL hands and legs. MAE, overcomes resistance, gen weakness. Assist x1 OOB to BR. RA, dim. No tele. C/o generalized pain, PRN tylenol x1 with good effect. C/o nausea, PRN zofran. R IJ clear/dry/intact, no leakage. Midline dressing clean/dry/intact. Continent, failed voiding trial, foley not placed d/t BS of only 97mL and asymptomatic.  Reg diet, thins, pills whole. Fall precautions in place.     PPE worn in patient's room: mask and gloves

## 2022-02-03 NOTE — PT Progress Note (Signed)
Physical Therapy Treatment  [name]   Post Acute Care Therapy Recommendations:   Discharge Recommendations: SNF    If SNF recommended discharge disposition is not available, patient will need hands on assist for mobility and ADLs and HHPT.     DME needs IF patient discharges home: Front wheel walker, Wheelchair-manual, [facility]    Therapy discharge recommendations may change with patient status.  Please refer to most recent note for up-to-date recommendations.  Assessment:   Significant Findings: none    Pt approached for skilled PT tx with RN consent, received semisupine in bed, agreeable to participate. Pt continues to demonstrate improving strength and endurance. Amb 64ft in one bout with min A and RW. 1 minor LOB/LE buckle noted, required min A from PT to recover. Slow, short, reciprocal gait with NBOS and absent B heelstrike. Pt will continue to benefit from skilled PT services to address strength, balance and endurance impairments in order to maximize functional independence.       Assessment: Decreased LE strength, Decreased safety/judgement during functional mobility, Decreased endurance/activity tolerance, Impaired coordination, Impaired motor control, Decreased functional mobility, Decreased balance, Gait impairment  Progress: Progressing toward goals  Prognosis: Good, With continued PT status post acute discharge  Risks/Benefits/POC Discussed with Pt/Family: With patient  Patient left without needs and call bell within reach. RN notified of session outcome.     Treatment Activities: gait training, therex, pt education    Educated the patient to role of physical therapy, plan of care, goals of therapy and safety with mobility and ADLs.    Plan:   Treatment/Interventions: Exercise, Gait training, Neuromuscular re-education, Functional transfer training, LE strengthening/ROM, Endurance training, Patient/family training, Equipment eval/education, Compensatory technique education        PT Frequency: 3-4x/wk      Continue plan of care.     Unit: [facility] TOWER 6 EAST  Bed: F613/F613.01    Precautions and Contraindications:   Precautions  Weight Bearing Status: no restrictions  Other Precautions: falls    Updated Medical Status/Imaging/Labs: Reviewed    Subjective: "I feel like the medicine is helping"   Patient Goal: to get better    Pain Assessment  Pain Assessment: No/denies pain    Patient's medical condition is appropriate for Physical Therapy intervention at this time.  Patient is agreeable to participation in the therapy session. Nursing clears patient for therapy.    Objective:   Observation of Patient/Vital Signs:  Patient is in bed with Quinton catheter, peripheral IV in place.  Pt wore mask during therapy session:No      Cognition/Neuro Status  Arousal/Alertness: Appropriate responses to stimuli  Attention Span: Appears intact  Orientation Level: Oriented X4  Following Commands: Follows all commands and directions without difficulty  Behavior: calm;cooperative         Functional Mobility:  Supine to Sit: Supervision  Scooting to EOB: Supervision  Sit to Supine: Supervision  Sit to Stand: Contact Guard Assist;Minimal Assist  Stand to Sit: Contact Guard Assist;Minimal Assist       Ambulation:  PMP - Progressive Mobility Protocol   PMP Activity: Step 4 - Dangle at Bedside  Distance Walked (ft) (Step 6,7): 60 Feet     Ambulation: Minimal Assist;with front-wheeled walker  Pattern: shuffle;R foot flat;L foot flat;decreased cadence;decreased step length    Therapeutic Exercise  Manual Stretch: RLE;LLE (No tightness/restricted ROM in BLEs, tolerated well)           Patient Participation: good  Patient Endurance: fair  Patient left with call bell within reach, all needs met, SCDs off, fall mat in place, bed alarm on and all questions answered. RN notified of session outcome and patient response.     Goals:  Goals  Goal Formulation: With patient  Time for Goal Acheivement: 7 visits  Pt Will Perform  Sit to Stand: with contact guard assist  Pt Will Transfer Bed/Chair: with rolling walker, with contact guard assist  Pt Will Ambulate: 31-50 feet, with rolling walker, with contact guard assist  Pt Will Propel Wheelchair: > 150 feet, independent    PPE worn during session: procedural mask and gloves    Tech present: none  PPE worn by tech: N/A    Leavy Cella, PT, DPT Pager # [phone] 02/03/2022 4:20 PM       Time of Treatment  PT Received On: 02/03/22  Start Time: 1345  Stop Time: 1410  Time Calculation (min): 25 min  Treatment # 3 out of 7 visits

## 2022-02-04 ENCOUNTER — Inpatient Hospital Stay: Payer: 59

## 2022-02-04 LAB — FIBRINOGEN: Fibrinogen: 170 mg/dL — ABNORMAL LOW (ref 181–413)

## 2022-02-04 LAB — CBC AND DIFFERENTIAL
Absolute NRBC: 0 10*3/uL (ref 0.00–0.00)
Basophils Absolute Automated: 0.05 10*3/uL (ref 0.00–0.08)
Basophils Automated: 0.5 %
Eosinophils Absolute Automated: 0.33 10*3/uL (ref 0.00–0.44)
Eosinophils Automated: 3.3 %
Hematocrit: 25 % — ABNORMAL LOW (ref 34.7–43.7)
Hgb: 8 g/dL — ABNORMAL LOW (ref 11.4–14.8)
Immature Granulocytes Absolute: 0.05 10*3/uL (ref 0.00–0.07)
Immature Granulocytes: 0.5 %
Instrument Absolute Neutrophil Count: 6.06 10*3/uL (ref 1.10–6.33)
Lymphocytes Absolute Automated: 3.08 10*3/uL (ref 0.42–3.22)
Lymphocytes Automated: 30.3 %
MCH: 26 pg (ref 25.1–33.5)
MCHC: 32 g/dL (ref 31.5–35.8)
MCV: 81.2 fL (ref 78.0–96.0)
MPV: 9.1 fL (ref 8.9–12.5)
Monocytes Absolute Automated: 0.58 10*3/uL (ref 0.21–0.85)
Monocytes: 5.7 %
Neutrophils Absolute: 6.06 10*3/uL (ref 1.10–6.33)
Neutrophils: 59.7 %
Nucleated RBC: 0 /100 WBC (ref 0.0–0.0)
Platelets: 309 10*3/uL (ref 142–346)
RBC: 3.08 10*6/uL — ABNORMAL LOW (ref 3.90–5.10)
RDW: 19 % — ABNORMAL HIGH (ref 11–15)
WBC: 10.15 10*3/uL — ABNORMAL HIGH (ref 3.10–9.50)

## 2022-02-04 LAB — BASIC METABOLIC PANEL
Anion Gap: 10 (ref 5.0–15.0)
BUN: 6 mg/dL — ABNORMAL LOW (ref 7.0–21.0)
CO2: 21 mEq/L (ref 17–29)
Calcium: 9 mg/dL (ref 8.5–10.5)
Chloride: 108 mEq/L (ref 99–111)
Creatinine: 0.5 mg/dL (ref 0.4–1.0)
Glucose: 83 mg/dL (ref 70–100)
Potassium: 3.5 mEq/L (ref 3.5–5.3)
Sodium: 139 mEq/L (ref 135–145)

## 2022-02-04 LAB — GFR: EGFR: >60

## 2022-02-04 LAB — CALCIUM, IONIZED: Calcium, Ionized: 2.57 mEq/L (ref 2.30–2.58)

## 2022-02-04 MED ORDER — DIAZEPAM 2 MG PO TABS
2.0000 mg | ORAL_TABLET | Freq: Three times a day (TID) | ORAL | Status: DC
Start: 2022-02-04 — End: 2022-02-08
  Administered 2022-02-04 – 2022-02-08 (×11): 2 mg via ORAL
  Filled 2022-02-04 (×11): qty 1

## 2022-02-04 MED ORDER — CALCIUM GLUCONATE-NACL 1-0.675 GM/50ML-% IV SOLN
1.0000 g | INTRAVENOUS | Status: AC
Start: 2022-02-04 — End: 2022-02-04
  Administered 2022-02-04 (×2): 1 g via INTRAVENOUS

## 2022-02-04 MED ORDER — ALBUMIN HUMAN/BIOSIMILIAR 5% IV SOLN (WRAP)
INTRAVENOUS | Status: AC
Start: 2022-02-04 — End: ?
  Filled 2022-02-04: qty 2750

## 2022-02-04 MED ORDER — ALTEPLASE 2 MG IJ SOLR
2.0000 mg | INTRAMUSCULAR | Status: DC | PRN
Start: 2022-02-04 — End: 2022-02-08

## 2022-02-04 MED ORDER — DIAZEPAM 5 MG PO TABS
2.5000 mg | ORAL_TABLET | Freq: Three times a day (TID) | ORAL | Status: DC
Start: 2022-02-04 — End: 2022-02-04

## 2022-02-04 MED ORDER — ACD FORMULA A 0.73-2.45-2.2 GM/100ML VI SOLN (APHERESIS)
300.0000 mL | Status: DC
Start: 2022-02-04 — End: 2022-02-08
  Administered 2022-02-04: 320 mL via INTRAVENOUS

## 2022-02-04 MED ORDER — CALCIUM GLUCONATE-NACL 1-0.675 GM/50ML-% IV SOLN
INTRAVENOUS | Status: AC
Start: 2022-02-04 — End: ?
  Filled 2022-02-04: qty 150

## 2022-02-04 MED ORDER — ALBUMIN HUMAN/BIOSIMILIAR 5% IV SOLN (WRAP)
25.0000 g | INTRAVENOUS | Status: AC
Start: 2022-02-04 — End: 2022-02-04
  Administered 2022-02-04: 125 g via INTRAVENOUS

## 2022-02-04 NOTE — Plan of Care (Signed)
Problem: Moderate/High Fall Risk Score >5  Goal: Patient will remain free of falls  Outcome: Progressing  Flowsheets (Taken 02/04/2022 1931)  Moderate Risk (6-13):   MOD-Apply bed exit alarm if patient is confused   MOD-Consider activation of bed alarm if appropriate   MOD-Floor mat at bedside (where available) if appropriate   MOD-Remain with patient during toileting   MOD-Place bedside commode and assistive devices out of sight when not in use   MOD-Consider a move closer to Nurses Station   MOD-Re-orient confused patients   MOD-Utilize diversion activities   MOD-Perform dangle, stand, walk (DSW) prior to mobilization   MOD-include family in multidisciplinary POC discussions   MOD-Use gait belt when appropriate   MOD-Request PT/OT consult order for patients with gait/mobility impairment     Problem: Neurological Deficit  Goal: Neurological status is stable or improving  Outcome: Progressing  Flowsheets (Taken 02/04/2022 2206)  Neurological status is stable or improving:   Observe for seizure activity and initiate seizure precautions if indicated   Perform CAM Assessment   Monitor/assess NIH Stroke Scale   Re-assess NIH Stroke Scale for any change in status   Monitor/assess/document neurological assessment (Stroke: every 4 hours)     Problem: Impaired Mobility  Goal: Mobility/Activity is maintained at optimal level for patient  Outcome: Progressing  Flowsheets (Taken 02/04/2022 2206)  Mobility/activity is maintained at optimal level for patient:   Increase mobility as tolerated/progressive mobility   Maintain proper body alignment   Perform active/passive ROM   Plan activities to conserve energy, plan rest periods   Reposition patient every 2 hours and as needed unless able to reposition self   Assess for changes in respiratory status, level of consciousness and/or development of fatigue   NURSING PROGRESS NOTE: STROKE UNIT    Patient Name: [name] (66 y.o. female)  Admission Date: 01/26/2022 [facility] Day  9)      Recent Labs   Lab 02/04/22  0309   Sodium 139   Potassium 3.5   Chloride 108   CO2 21   BUN 6.0*   Creatinine 0.5   EGFR >60.0   Glucose 83   Calcium 9.0       Recent Labs   Lab 02/04/22  0309   WBC 10.15*   Hgb 8.0*   Hematocrit 25.0*   Platelets 309         Patient Lines/Drains/Airways Status       Active Lines, Drains and Airways       Name Placement date Placement time Site Days    Temporary Catheter - Non-Tunneled 01/27/22 Internal Jugular Right 01/27/22  1525  -- 8    Midline IV 01/27/22 Anterior;Left Upper Arm 01/27/22  1738  Upper Arm  8    Urethral Catheter Non-latex 16 Fr. 02/03/22  1200  Non-latex  1                       Braden Scale Score: 18 (02/04/22 2000)      Skin Integrity: Scars, Blanchable Redness, Bruising  Bruising Skin Location: scattered    Wound 01/03/22 Stage 1 Coccyx;Sacrum unblanchable redness (Active)   Date First Assessed/Time First Assessed: 01/03/22 2200   Pressure Injury Staging (WOCN/ Trained RNs Only): Stage 1  Location: Coccyx;Sacrum  Wound Description (Comments): unblanchable redness  Present on Admission: Yes      Assessments 01/03/2022 11:00 PM 02/04/2022  8:00 AM   Site Description Clean;Dry;Intact Intact   Peri-wound Description Clean;Dry --  Closure Open to air --   Treatments Site care --   Dressing Foam --   Dressing Changed New --   Dressing Status Clean;Dry;Intact --       No associated orders.       Puncture Site 01/27/22 Venous Anterior;Right Neck (Active)   Date First Assessed/Time First Assessed: 01/27/22 1518   Puncture site location: Venous  Wound Location Orientation: Anterior;Right  Location: Neck      Assessments 01/27/2022  3:28 PM 02/04/2022  8:00 AM   Site Assessment Clean;Dry;Intact;Soft;Non Tender Clean;Dry;Intact   Dressing Status Dry;Intact Clean;Dry;Intact   Dressing Changed New --   Drainage Amount None --   Pressure device present? -- No       No associated orders.       Safety Checklist   1:1 Sitter N    Avasys N       Interpreter Services:  Does  the patient require an Interpreter? N    If yes, what form of interpreter services was used? N     If family was utilized, is Tour manager form signed and in the chart? Y      ASSESSMENT/PLAN:    Last BM: 04/13    Pending Orders: Apheresis    Discharge Plan: SNF    POC / Family Update: Patient updated    Shift Note:  Pt drowsy. Oriented x 4 / Follows commands, clear speech. MAE w/ generalized weakness. Pt endorses intermittent numbness and tingling in all extremities and lips. Able to ambulate 1 assist with walker. PRN benadryl and Zofran given. Tolerating diet,pills whole. Foley in place.    PPE worn in patient's room: Surgical mask, gloves

## 2022-02-04 NOTE — OT Progress Note (Signed)
Occupational Therapy Treatment  [name]        Post Acute Care Therapy Recommendations:     Discharge Recommendations:  SNF    If SNF  recommended discharge disposition is not available, patient will need hands on assist for ADLs, functional mobility, transfers, and HHOT.     DME needs IF patient is discharging home: BSC, Wheelchair-manual, Front wheel walker    Therapy discharge recommendations may change with patient status.  Please refer to most recent note for up-to-date recommendations.    Assessment:   Significant Findings: None    RN approved pt for therapy and pt semi-supine in bed upon OT arrival. Pt completed bed mobility with SPV for safety and increased time. Pt completed short distance mobility with the RW in/out of the bathroom with CGA for safety and transferred on/off the toilet with Mod A for lifting and pt with heavy reliance on the grab bar. Provided mod cues for t/f techniques to promote independence and safety. Pt educated on use of 3-in-1 commode over the toilet to promote independence; however, pt preferred to use the actual toilet. Encouraged pt more trials to promote strength; however, pt requesting to rest. Pt returned to semi- supine in bed with all needs met. Pt continues to demonstrate decreased strength, decreased balance, decreased endurance, decreased activity tolerance, and decreased coordination, which impacts pt's overall safety and independence with functional t/f, ADLs, and IADLs. Pt is functioning below PLOF and will benefit from continued acute OT services to maximize independence and safety with ADLs and functional transfers/mobility.      Treatment Activities: ADL retraining, functional t/f training, endurance, pt education, dynamic balance     Educated the patient to role of occupational therapy, plan of care, goals of therapy and safety with mobility and ADLs, home safety.    Plan:    OT Frequency Recommended: 3-4x/wk     Continue plan of care.    Unit: [facility] TOWER 6 EAST  Bed: F613/F613.01       Precautions and Contraindications:   Fall   Bleeding  Monitor HR    Updated Medical Status/Imaging/Labs:  Reviewed     Subjective: "I would prefer to use the toilet." -Pt when educated on 3-in-1 commode over the toilet    Patient's medical condition is appropriate for Occupational Therapy intervention at this time.  Patient is agreeable to participation in the therapy session. Nursing clears patient for therapy.    Pain:   Scale: No pain reported      Objective:   Patient is in bed with temporary catheter, midline, foley catheter, and peripheral IV in place.  Pt wore mask during therapy session:No      Cognition  Pt alert and oriented x 4. Pt able to follow multi-step commands. Note decreased self-monitoring during tasks, as pt requires cueing to implement rest breaks for decreased fall risk.     Functional Mobility  Rolling:  SPV  Supine to Sit: SPV  Sit to Stand: CGA-Min A  Transfers: CGA-Min A    PMP Activity: Step 6 - Walks in Room    Balance  Static Sitting: Good  Static Standing: Good-     Self Care and Home Management  Eating: SPV  Grooming: Min A/Mod A standing at the sink  Bathing: Mod A, seated EOB  UE Dressing: Min A, seated EOB, assist for management over catheter at cervical region  LE Dressing: Mod A, seated EOB, assist for foley management and steadying during  clothing management up  Toileting: Mod-Max A     Therapeutic Exercises  With activity    Participation: Good  Endurance: Fair+    Patient left with call bell within reach, all needs met, SCDs off as found, fall mat in mat, bed alarm on, chair alarm N/A and all questions answered. RN notified of session outcome and patient response.     Goals:  Time For Goal Achievement: 7 visits  ADL Goals  Patient will groom self: Risk analyst, at sinkside, 7 visits  Patient will dress lower body: Minimal Assist, 7 visits  Pt will complete bathing: Minimal Assist, 7 visits  Patient will toilet:  Minimal Assist, 7 visits  Mobility and Transfer Goals  Pt will perform functional transfers: Contact Guard Assist, 7 visits (w/ LRD)  Neuro Re-Ed Goals  Pt will perform dynamic sitting balance: Supervision, to increase ability to complete ADLs, 7 visits           Vision Goals  Pt with diplopia will complete ADLs with adaptive equipment: Discontinued (comment)            PPE worn during session: procedural mask and gloves    Tech present: no  PPE worn by tech: N/A    Raliegh Ip, OTR/L   Pager #: [phone]     Time of Treatment  OT Received On: 02/04/22  Start Time: 1212  Stop Time: 1236  Time Calculation (min): 24 min    Treatment # 4 of 7 visits

## 2022-02-04 NOTE — Plan of Care (Addendum)
NURSING PROGRESS NOTE: STROKE UNIT    Patient Name: [name] (66 y.o. female)  Admission Date: 01/26/2022 [facility] Day 9)      Recent Labs   Lab 02/04/22  0309   Sodium 139   Potassium 3.5   Chloride 108   CO2 21   BUN 6.0*   Creatinine 0.5   EGFR >60.0   Glucose 83   Calcium 9.0       Recent Labs   Lab 02/04/22  0309   WBC 10.15*   Hgb 8.0*   Hematocrit 25.0*   Platelets 309         Patient Lines/Drains/Airways Status       Active Lines, Drains and Airways       Name Placement date Placement time Site Days    Temporary Catheter - Non-Tunneled 01/27/22 Internal Jugular Right 01/27/22  1525  -- 8    Midline IV 01/27/22 Anterior;Left Upper Arm 01/27/22  1738  Upper Arm  7    Urethral Catheter Non-latex 16 Fr. 02/03/22  1200  Non-latex  1                       Braden Scale Score: 18 (02/04/22 0800)      Skin Integrity: Scars, Blanchable Redness, Bruising  Bruising Skin Location: scattered    Wound 01/03/22 Stage 1 Coccyx;Sacrum unblanchable redness (Active)   Date First Assessed/Time First Assessed: 01/03/22 2200   Pressure Injury Staging (WOCN/ Trained RNs Only): Stage 1  Location: Coccyx;Sacrum  Wound Description (Comments): unblanchable redness  Present on Admission: Yes      Assessments 01/03/2022 11:00 PM 02/04/2022  8:00 AM   Site Description Clean;Dry;Intact Intact   Peri-wound Description Clean;Dry --   Closure Open to air --   Treatments Site care --   Dressing Foam --   Dressing Changed New --   Dressing Status Clean;Dry;Intact --       No associated orders.       Puncture Site 01/27/22 Venous Anterior;Right Neck (Active)   Date First Assessed/Time First Assessed: 01/27/22 1518   Puncture site location: Venous  Wound Location Orientation: Anterior;Right  Location: Neck      Assessments 01/27/2022  3:28 PM 02/04/2022  8:00 AM   Site Assessment Clean;Dry;Intact;Soft;Non Tender Clean;Dry;Intact   Dressing Status Dry;Intact Clean;Dry;Intact   Dressing Changed New --   Drainage Amount None --   Pressure device  present? -- No       No associated orders.       Safety Checklist   1:1 Sitter N    Avasys N       Interpreter Services:  Does the patient require an Interpreter? N    If yes, what form of interpreter services was used? N/A     If family was utilized, is interpreter waiver form signed and in the chart? N/A      ASSESSMENT/PLAN:    Last BM: 4/13    Pending Orders: PLEX (5/5) on Monday 4/17     Discharge Plan: [facility]    POC / Family Update: Pt w/ RN, updated daughter via phone    Shift Note: A/Ox4. Speech clear and slightly delayed. Follows commands. MAE: moderate and can overcome resistance, intermittent tingling in arms and feet. OOB to BR w/ walker and x1 assist. RA, lung sounds clear to diminished. No tele. Continent to BM, foley in place. Foley care completed this shift. Tolerating regular diet, takes pills whole with thins. C/o  itchiness, requesting IV benadryl, x1 given.    PLEX 4/5 completed this shift. Pt c/o nausea and itchiness s/p PLEX tx, PRN zofran and benadryl given.    Fall precautions in place: bed in lowest position, call light within reach, fall mat in place, bed alarm on.   PPE worn in patient's room: surgical mask, gloves    Problem: Compromised Tissue integrity  Goal: Damaged tissue is healing and protected  Outcome: Progressing  Flowsheets (Taken 02/04/2022 0728)  Damaged tissue is healing and protected:   Provide wound care per wound care algorithm   Monitor/assess Braden scale every shift   Reposition patient every 2 hours and as needed unless able to reposition self   Increase activity as tolerated/progressive mobility   Relieve pressure to bony prominences for patients at moderate and high risk   Avoid shearing injuries   Keep intact skin clean and dry   Use bath wipes, not soap and water, for daily bathing   Use incontinence wipes for cleaning urine, stool and caustic drainage. Foley care as needed   Monitor external devices/tubes for correct placement to prevent pressure, friction  and shearing   Encourage use of lotion/moisturizer on skin   Monitor patient's hygiene practices     Problem: Neurological Deficit  Goal: Neurological status is stable or improving  Outcome: Progressing  Flowsheets (Taken 02/04/2022 0728)  Neurological status is stable or improving:   Monitor/assess/document neurological assessment (Stroke: every 4 hours)   Re-assess NIH Stroke Scale for any change in status   Monitor/assess NIH Stroke Scale   Observe for seizure activity and initiate seizure precautions if indicated   Perform CAM Assessment     Problem: Impaired Mobility  Goal: Mobility/Activity is maintained at optimal level for patient  Outcome: Progressing  Flowsheets (Taken 02/04/2022 0728)  Mobility/activity is maintained at optimal level for patient:   Increase mobility as tolerated/progressive mobility   Maintain proper body alignment   Encourage independent activity per ability   Plan activities to conserve energy, plan rest periods   Perform active/passive ROM   Reposition patient every 2 hours and as needed unless able to reposition self   Assess for changes in respiratory status, level of consciousness and/or development of fatigue   Consult/collaborate with Physical Therapy and/or Occupational Therapy     Problem: Anxiety  Goal: Anxiety is at a manageable level  Outcome: Progressing  Flowsheets (Taken 02/04/2022 0728)  Anxiety is at a manageable level:   Orient to unit   Inform/explain to patient/patient care companion all tests/procedures/treatment/care prior to initiation   Facilitate expression of feelings, fears, concerns, anxiety   Assess emotional status and coping mechanisms   Provide alternatives to reduce anxiety   Provide and maintain a safe environment   Limit or eliminate stimulants such as caffeine and nicotine   Consult/collaborate with ancillary departments   Provide emotional support   Include patient/patient care companion in decisions related to anxiety/depression   Assist in developing  anxiety-reducing skills   Encourage participation in care

## 2022-02-04 NOTE — Progress Notes (Signed)
[facility] Neu[facility] Consultation Progress Note    Date Time: 02/04/22 9:45 AM  Patient Name: [name]  Outpatient Neurologist :    CC: LE weakness, paresthesia      Assessment:   66 yo F with PMHx of afib (on eliquis), HTN, HLD, DM, COPD, TIA, bipolar d/o, recent rhabdomyolysis (02/23), recent [facility] admission 3/13-01/26/22 for weakness and discharged to acute rehab 01/26/22. Pt called back to [facility] on recommendation of neurologist Dr. 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 PLEX extended to 5 sessions. On exam, pt drowsy but oriented, leg strength stable compared to yesterday. Will decrease valium to 2mg  tid, given drowsiness.    -Workup on recent admission 3/13-4/5:  -CSF protein 52, WBC 1. ME panel, cx, HSV negative  -ENS2: +GAD65 Ab  -Ganglioside Ab panel neg  -Motor sensory neuropathy panel neg  -MG panel neg  -RPR, HIV nonreactive  -TSH, B12, copper,  wnl              -Anaphylactic response to IVIg (IgA 435) Lips, tongue swelling with itchiness.              -3/6 EMG showing a myopathic process affecting proximal BLE              -CT chest/abd/pelvis unremarkable              -CK 239, Vit D 10. LDH 450  -MRI femur bilat WWO 3/9: Mild edema and atrophy involving the semimembranosus and biceps femoris muscles of the right and left thighs, noted to be bilateral and symmetric in appearance.       -Workup this admission:  -CK 21  -MRI brain WWO: no acute findings; cerebellar volume loss; rathke's cleft cyst        Patient Active Problem List   Diagnosis    CVA (cerebral vascular accident)    Chest pain    Chest pain with high risk of acute coronary syndrome    Paroxysmal atrial fibrillation    Hypertension    Hyperlipidemia    Seizure disorder    History of gastrointestinal hemorrhage    Depression    Anemia     Asthma    Non-traumatic subcutaneous emphysema    Chest pain with moderate risk for cardiac etiology    Left-sided weakness    History of asthma    History of transient ischemic attack (TIA)    Type 2 diabetes mellitus    Syncope and collapse    Weakness of both legs    Hypokalemia    Thoracic back pain    Gastroparesis    Guillain Barr syndrome    Myositis    Stiff person syndrome       Plan:   -MRI brain WWO reviewed  -PLEX x 5 sessions, #4/5. Last PLEX will be Monday 4/17  -Decrease valium to 2mg  q8hrs for leg stiffness   -Supportive measures.       Interval History/Subjective:   Pt drowsy today, denies change in symptoms.     Medications:     Current Facility-Administered Medications   Medication Dose Route Frequency    apixaban  5 mg Oral Q12H SCH    budesonide-formoterol  2 puff Inhalation BID    ciprofloxacin  500 mg Oral Q12H SCH    diazePAM  5 mg Oral  Q8H SCH    heparin (porcine)  5,000 Units Intracatheter Once    heparin (porcine)  5,000 Units Intracatheter Once    heparin (porcine)  5,000 Units Intracatheter Once    heparin (porcine)  5,000 Units Intracatheter Once    lurasidone  120 mg Oral QHS    metoprolol succinate XL  37.5 mg Oral Q12H    midodrine  10 mg Oral BID Meals    pantoprazole  40 mg Oral QAM    polyethylene glycol  17 g Oral Daily    rOPINIRole  0.5 mg Oral QHS    senna-docusate  2 tablet Oral QHS    tamsulosin  0.4 mg Oral Daily    thiamine  100 mg Oral Daily    vitamin B-6  50 mg Oral Daily    vitamin D  25 mcg Oral Daily    vitamins/minerals  1 tablet Oral Daily    zinc sulfate  220 mg Oral Daily       Review of Systems:   Neurological ROS: negative except as noted in interval hx / subjective  Physical Exam:   Temp:  [97.7 F (36.5 C)-98.8 F (37.1 C)] 98.4 F (36.9 C)  Heart Rate:  [100-109] 103  Resp Rate:  [16-18] 17  BP: (120-127)/(74-83) 125/83    Vital Signs:  Reviewed    General: Well developed and well nourished. No acute distress. Cooperative with the exam  ENT: Normal oral  mucosa, no ear or nose discharge  Neck: Symmetric, no deformities  CV: RRR  Resp: No audible wheezing, normal work of breathing  Abd: Nondistended  Skin: Intact, extremities normal in color  Psych: Affect is drowsy    Mental Status: The patient is awake, alert. Oriented to self, hospital, April, 2023.  Fund of knowledge appropriate  Attention span and concentration appear normal.  Language function is normal. There is no evidence of aphasia in conversational speech.  +dysarthria    Cranial nerves:   -CN II: tracking  -CN III, IV, VI: Pupils equal, round, and reactive to light; EOMs sluggish but intact; no ptosis. Nystagmus with lateral gaze bilat. No vertical nystagmus.              -CN V: Facial sensation intact in V1 through V3 distributions   -CN VII: trace L facial droop   -CN VIII: Hearing intact to conversational speech   -CN IX, X: Normal phonation   -CN XI: Symmetric full strength of trapezius muscles   -CN XII: Tongue protrudes midline    Motor: Muscle tone normal without spasticity or flaccidity. Temporalis atrophy bilat     UEs:   Deltoid Bicep Tricep Grip   Right 5 5 5 5    Left 5 5 5 5      LEs   HF KF KE PF DF   Right 2 5 5 5 5    Left 2 5 4+ 5 5     Sensory:   LT and Temp diminished left hand, left leg over shin only    Reflexes:   Plantars: flexor b/l    Coordination: No tremors    Gait: deferred      Labs:     Results       Procedure Component Value Units Date/Time    GFR [[id number]] Collected: 02/04/22 0309     Updated: 02/04/22 0501     EGFR >60.0    Basic Metabolic Panel [[id number]]  (Abnormal) Collected: 02/04/22 0309    Specimen: Blood Updated: 02/04/22 0501  Glucose 83 mg/dL      BUN 6.0 mg/dL      Creatinine 0.5 mg/dL      Calcium 9.0 mg/dL      Sodium 478 mEq/L      Potassium 3.5 mEq/L      Chloride 108 mEq/L      CO2 21 mEq/L      Anion Gap 10.0    CBC and differential [[id number]]  (Abnormal) Collected: 02/04/22 0309    Specimen: Blood Updated: 02/04/22 0439     WBC 10.15 x10 3/uL      Hgb  8.0 g/dL      Hematocrit 65.7 %      Platelets 309 x10 3/uL      RBC 3.08 x10 6/uL      MCV 81.2 fL      MCH 26.0 pg      MCHC 32.0 g/dL      RDW 19 %      MPV 9.1 fL      Instrument Absolute Neutrophil Count 6.06 x10 3/uL      Neutrophils 59.7 %      Lymphocytes Automated 30.3 %      Monocytes 5.7 %      Eosinophils Automated 3.3 %      Basophils Automated 0.5 %      Immature Granulocytes 0.5 %      Nucleated RBC 0.0 /100 WBC      Neutrophils Absolute 6.06 x10 3/uL      Lymphocytes Absolute Automated 3.08 x10 3/uL      Monocytes Absolute Automated 0.58 x10 3/uL      Eosinophils Absolute Automated 0.33 x10 3/uL      Basophils Absolute Automated 0.05 x10 3/uL      Immature Granulocytes Absolute 0.05 x10 3/uL      Absolute NRBC 0.00 x10 3/uL             Rads:   MRI Brain W WO Contrast    Result Date: 02/04/2022  1. No acute intracranial finding. 2. Mild global volume loss and chronic small vessel ischemic changes. 3. Prominent cerebellar volume loss. 4. 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 Gaylyn Rong, MD 02/04/2022 7:54 AM       Signed by:  Sarita Haver, PA PA-C   [facility]  [facility] Consult Spectra: [phone]  [facility] Consult Spectra: [phone]  Between 1630-1900, please epic chat: "[facility] General Neurology"  After 1900, call: [phone]  4:48 PM  02/04/22    Please see attending Neurologist note that accompanies this mid-level encounter note.

## 2022-02-04 NOTE — Plan of Care (Signed)
Problem: Moderate/High Fall Risk Score >5  Goal: Patient will remain free of falls  Outcome: Progressing  Flowsheets (Taken 02/03/2022 2200)  Moderate Risk (6-13):   MOD- Consider video monitoring   MOD-include family in multidisciplinary POC discussions   MOD-Use gait belt when appropriate   MOD-Request PT/OT consult order for patients with gait/mobility impairment   MOD-Perform dangle, stand, walk (DSW) prior to mobilization   MOD-Utilize diversion activities   MOD-Re-orient confused patients   MOD-Floor mat at bedside (where available) if appropriate   MOD-Apply bed exit alarm if patient is confused   MOD-Place bedside commode and assistive devices out of sight when not in use     Problem: Neurological Deficit  Goal: Neurological status is stable or improving  Outcome: Progressing  Flowsheets (Taken 02/04/2022 0045)  Neurological status is stable or improving:   Monitor/assess/document neurological assessment (Stroke: every 4 hours)   Perform CAM Assessment   Observe for seizure activity and initiate seizure precautions if indicated     Problem: Impaired Mobility  Goal: Mobility/Activity is maintained at optimal level for patient  Outcome: Progressing  Flowsheets (Taken 02/04/2022 0045)  Mobility/activity is maintained at optimal level for patient:   Increase mobility as tolerated/progressive mobility   Maintain proper body alignment   Perform active/passive ROM   Plan activities to conserve energy, plan rest periods   Reposition patient every 2 hours and as needed unless able to reposition self   Assess for changes in respiratory status, level of consciousness and/or development of fatigue  NURSING PROGRESS NOTE: STROKE UNIT    Patient Name: [name] (66 y.o. female)  Admission Date: 01/26/2022 [facility] Day 9)      Recent Labs   Lab 02/03/22  0335   Sodium 138   Potassium 3.3*   Chloride 111   CO2 18   BUN 6.0*   Creatinine 0.5   EGFR >60.0   Glucose 102*   Calcium 8.7       Recent Labs   Lab 02/03/22  0335    WBC 10.14*   Hgb 8.0*   Hematocrit 24.8*   Platelets 307         Patient Lines/Drains/Airways Status       Active Lines, Drains and Airways       Name Placement date Placement time Site Days    Temporary Catheter - Non-Tunneled 01/27/22 Internal Jugular Right 01/27/22  1525  -- 7    Midline IV 01/27/22 Anterior;Left Upper Arm 01/27/22  1738  Upper Arm  7    Urethral Catheter Non-latex 16 Fr. 02/03/22  1200  Non-latex  less than 1                       Braden Scale Score: 20 (02/03/22 2000)      Skin Integrity: Scars, Blanchable Redness, Bruising  Bruising Skin Location: scattered    Wound 01/03/22 Stage 1 Coccyx;Sacrum unblanchable redness (Active)   Date First Assessed/Time First Assessed: 01/03/22 2200   Pressure Injury Staging (WOCN/ Trained RNs Only): Stage 1  Location: Coccyx;Sacrum  Wound Description (Comments): unblanchable redness  Present on Admission: Yes      Assessments 01/03/2022 11:00 PM 02/03/2022 10:00 PM   Site Description Clean;Dry;Intact Intact   Peri-wound Description Clean;Dry Intact   Closure Open to air --   Treatments Site care --   Dressing Foam --   Dressing Changed New --   Dressing Status Clean;Dry;Intact --       No  associated orders.       Puncture Site 01/27/22 Venous Anterior;Right Neck (Active)   Date First Assessed/Time First Assessed: 01/27/22 1518   Puncture site location: Venous  Wound Location Orientation: Anterior;Right  Location: Neck      Assessments 01/27/2022  3:28 PM 02/03/2022 10:00 PM   Site Assessment Clean;Dry;Intact;Soft;Non Tender Clean;Dry;Intact   Dressing Status Dry;Intact Dry;Intact;Clean   Dressing Changed New --   Drainage Amount None --       No associated orders.       Safety Checklist   1:1 Sitter N    Avasys N       Interpreter Services:  Does the patient require an Interpreter? N    If yes, what form of interpreter services was used? N     If family was utilized, is Tour manager form signed and in the chart? N      ASSESSMENT/PLAN:    Last BM:  04/13    Pending Orders: MRI, apheresis    Discharge Plan: SNF    POC / Family Update: patient updated    Shift Note: Pt drowsy. Oriented x 4/ Follows commands, clear speech. MAE w/ generalized weakness. Able to ambulate 1 assist with walker. Tolerating diet,pills whole.     MRI done this shift    PPE worn in patient's room: Surgical mask, gloves

## 2022-02-04 NOTE — Progress Notes (Signed)
[facility] HOSPITALIST PROGRESS NOTE    Date Time: 02/04/22 12:28 PM  Patient Name: [name]  Attending Physician: Durene Romans, DO        History of Presenting Illness and Interval History/24 hour Events:   HPI per Admitting Provider  " [name] is a 66 y.o. female hx afib on Eliquis, HTN, HLD, DM, COPD, TIAs, COPD, bipolar disorder, [facility] admit 2/7-2/17/23 for rhabdomyolysis (peak CK 4464 - statin d/c'ed) + vomiting ?gastroparesis + starvation ketoacidosis + UTI, [facility] admit 2/18-2/20/23 for rhabdomyolysis (peak CK 2509) + transaminitis + bilateral leg weakness + right paresthesias, admit 3/4-3/13 for bilateral hand/leg weakness and paresthesias  (unclear etiology, suspect myositis s/p IVIG but had facial paresthesias so it was stopped) + leg muscle edema on MRI (no biopsy done) + non-ischemic cardiomyopathy (EF 43%) who presents to the hospital with ongoing paraparesis. She was at [facility] rehab and her hand strength improved somewhat but she is still paraparetic. She was discharged to [facility] today but his neurologist Dr. 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 These symptoms are sudden onset, severe intensity, without alleviating factors.  "    Subjective 4/6: NAE. Patient reports no new focal complaints. Continues to show improvement with weakens, but sill reports limb stiffness.      Subjective 4/7: NAE. Patient reports improved mobility today after starting valium yesterday.    Subjective 4/8: NAE. Patient without focal complaints, tolerated PLEX yesterday  Subjective 4/9: Reports improved mobility in LE.   Subjective 4/10: Receiving PLEX #2 this AM. NO focal compaints, sleepy   Subjective 4/11: No acute events.  Patient tolerated Plex well yesterday.  Complains of drooling from left  side of her mouth past 3 to 4 days  4/12:  No acute events. Pt denied any fever, chills, sob, chest pain  4/13: No acute events. Pt denied any fever, chills, sob, chest pain  4/14:  No acute events. Pt denied any fever, chills, sob, chest pain        Physical Exam:     Vitals:    02/04/22 1100   BP: 123/82   Pulse: 98   Resp: 16   Temp: 98.1 F (36.7 C)   SpO2: 98%       Intake and Output Summary (Last 24 hours) at Date Time    Intake/Output Summary (Last 24 hours) at 02/04/2022 1228  Last data filed at 02/03/2022 2000  Gross per 24 hour   Intake --   Output 850 ml   Net -850 ml         General: Awake, alert, oriented  HEENT: NC/AT.  MMM  Neck: supple  Cardiovascular: regular rate and rhythm, no murmurs, rubs or gallops  Lungs: clear to auscultation bilaterally, without wheezing, rhonchi, or rales  Abdomen: soft, non-tender, non-distended; normoactive bowel sounds, no rebound or guarding  Extremities: increased tone LLE>RLE.  (Overall LLE tone improved)  Neuro: Mild left facial droop, strength 5/5 in upper and 4/5 lower extremities, sensation intact except paraesthesias in hands/feet  Skin: no rashes or lesions noted      Medications:     Current Facility-Administered Medications   Medication Dose Route Frequency    apixaban  5 mg Oral Q12H SCH    budesonide-formoterol  2 puff Inhalation BID  ciprofloxacin  500 mg Oral Q12H SCH    diazePAM  5 mg Oral Q8H SCH    heparin (porcine)  5,000 Units Intracatheter Once    heparin (porcine)  5,000 Units Intracatheter Once    heparin (porcine)  5,000 Units Intracatheter Once    heparin (porcine)  5,000 Units Intracatheter Once    lurasidone  120 mg Oral QHS    metoprolol succinate XL  37.5 mg Oral Q12H    midodrine  10 mg Oral BID Meals    pantoprazole  40 mg Oral QAM    polyethylene glycol  17 g Oral Daily    rOPINIRole  0.5 mg Oral QHS    senna-docusate  2 tablet Oral QHS    tamsulosin  0.4 mg Oral Daily    thiamine  100 mg Oral Daily    vitamin B-6  50 mg Oral Daily     vitamin D  25 mcg Oral Daily    vitamins/minerals  1 tablet Oral Daily    zinc sulfate  220 mg Oral Daily         Labs:     Results       Procedure Component Value Units Date/Time    Calcium, ionized [[id number]] Collected: 02/04/22 1158    Specimen: Blood Updated: 02/04/22 1222     Calcium, Ionized 2.57 mEq/L     Narrative:      Pre-procedure if AM lab not done    Fibrinogen [[id number]]  (Abnormal) Collected: 02/04/22 1019    Specimen: Blood Updated: 02/04/22 1047     Fibrinogen 170 mg/dL     GFR [[id number]] Collected: 02/04/22 0309     Updated: 02/04/22 0501     EGFR >60.0    Basic Metabolic Panel [[id number]]  (Abnormal) Collected: 02/04/22 0309    Specimen: Blood Updated: 02/04/22 0501     Glucose 83 mg/dL      BUN 6.0 mg/dL      Creatinine 0.5 mg/dL      Calcium 9.0 mg/dL      Sodium 629 mEq/L      Potassium 3.5 mEq/L      Chloride 108 mEq/L      CO2 21 mEq/L      Anion Gap 10.0    CBC and differential [[id number]]  (Abnormal) Collected: 02/04/22 0309    Specimen: Blood Updated: 02/04/22 0439     WBC 10.15 x10 3/uL      Hgb 8.0 g/dL      Hematocrit 01.0 %      Platelets 309 x10 3/uL      RBC 3.08 x10 6/uL      MCV 81.2 fL      MCH 26.0 pg      MCHC 32.0 g/dL      RDW 19 %      MPV 9.1 fL      Instrument Absolute Neutrophil Count 6.06 x10 3/uL      Neutrophils 59.7 %      Lymphocytes Automated 30.3 %      Monocytes 5.7 %      Eosinophils Automated 3.3 %      Basophils Automated 0.5 %      Immature Granulocytes 0.5 %      Nucleated RBC 0.0 /100 WBC      Neutrophils Absolute 6.06 x10 3/uL      Lymphocytes Absolute Automated 3.08 x10 3/uL      Monocytes Absolute Automated 0.58 x10 3/uL      Eosinophils  Absolute Automated 0.33 x10 3/uL      Basophils Absolute Automated 0.05 x10 3/uL      Immature Granulocytes Absolute 0.05 x10 3/uL      Absolute NRBC 0.00 x10 3/uL                   @A1c @    Radiology:     Radiology Results (24 Hour)       Procedure Component Value Units Date/Time    MRI Brain W WO Contrast [[id number]]  Collected: 02/04/22 0745    Order Status: Completed Updated: 02/04/22 0756    Narrative:      HISTORY: New drooling. Left facial weakness. Ophthalmoplegia.    COMPARISON: 12/25/2021 MRI brain, 12/24/2021 CT head, 06/16/2019 MRI  brain and multiple CT head and MRI brain exams dating back to the  08/22/2018 MRI brain.    TECHNIQUE: MRI of the brain performed on a 3.0 Tesla scanner without and  with 12 mL of Clariscan intravenous contrast. Examination performed per  routine brain imaging protocol.     FINDINGS:   No acute intracranial hemorrhage. No intracranial mass, mass effect or  midline shift. The ventricles are within normal size limits for the  degree of background mild global volume loss. Normal variant cavum  septum pellucidum. Scattered periventricular and subcortical T2 FLAIR  hyperintense lesions throughout the supratentorial white matter  consistent chronic small vessel ischemic changes. Prominent cerebellar  volume loss unchanged compared to the 12/25/2021 MRI and slightly  progressed when compared to the 06/16/2019 MRI. No diffusion restriction  to suggest recent infarct. The major intracranial flow voids are  maintained. No suspicious areas of increased susceptibility. No abnormal  intracranial parenchymal or leptomeningeal enhancement.    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 Stable appearance compared to the  12/25/2021 MRI, although slightly increased in prominence compared to  the earlier 06/16/2019 MRI.    The orbits are unremarkable. The visualized paranasal sinuses and the  bilateral mastoid air cells are clear.      Impression:          1. No acute intracranial finding.    2. Mild global volume loss and chronic small vessel ischemic changes.    3. Prominent cerebellar volume loss.    4. 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    Gaylyn Rong, MD  02/04/2022 7:54 AM            Lines  Patient Lines/Drains/Airways Status       Active PICC Line / CVC Line / PIV Line / Drain / Airway / Intraosseous Line / Epidural Line / ART Line / Line / Wound / Pressure Ulcer / NG/OG Tube       Name Placement date Placement time Site Days    Urethral Catheter Non-latex;Straight-tip 16 Fr. 01/23/22  1457  Non-latex;Straight-tip  3    Wound 01/03/22 Stage 1 Coccyx;Sacrum unblanchable redness 01/03/22  2200  Coccyx;Sacrum  23                  Urethral Catheter Non-latex;Straight-tip 16 Fr. (Active)   Catheter necessity reviewed? Yes 01/27/22 1100   Site Assessment WDL 01/27/22 0800   Pericare (With Urinary Catheter) Yes 01/27/22 1100   Collection Container Standard drainage bag with urimeter 01/27/22 1100   Securement Method Securement device 01/27/22 1100  Reason for Continuing Urinary Catheterization past POD 1 Acute urinary retention due to nerve injury 01/27/22 0800   Positioned catheter tubing for unobstructed urine flow: Yes 01/27/22 1100   Urine Output (mL) 300 mL 01/26/22 1500   Urine Output (mL) Total 300 mL 01/26/22 1500   Number of days: 3         Assessment:     Patient Active Problem List   Diagnosis    CVA (cerebral vascular accident)    Chest pain    Chest pain with high risk of acute coronary syndrome    Paroxysmal atrial fibrillation    Hypertension    Hyperlipidemia    Seizure disorder    History of gastrointestinal hemorrhage    Depression    Anemia    Asthma    Non-traumatic subcutaneous emphysema    Chest pain with moderate risk for cardiac etiology    Left-sided weakness    History of asthma    History of transient ischemic attack (TIA)    Type 2 diabetes mellitus    Syncope and collapse    Weakness of both legs    Hypokalemia    Thoracic back pain    Gastroparesis    Guillain Barr syndrome    Myositis    Stiff person syndrome     66 y.o. female hx afib on Eliquis, HTN, HLD, DM, COPD, TIAs, bipolar disorder, [facility]  admit 2/7-2/17/23 for rhabdomyolysis (peak CK 4464 - statin d/c'ed) + vomiting ?gastroparesis + starvation ketoacidosis + UTI, [facility] admit 2/18-2/20/23 for rhabdomyolysis (peak CK 2509) + transaminitis + bilateral leg weakness + right paresthesias, admit 3/4-3/13 for bilateral hand/leg weakness and paresthesias  (unclear etiology, suspect myositis s/p IVIG but had facial paresthesias so it was stopped) + leg muscle edema on MRI (no biopsy done) + non-ischemic cardiomyopathy (EF 43%) who presents with ongoing paraparesis with +GAD65, suspecting stiff person syndrome.   Plan:   Stiff Person Syndrome  - in setting of +GAD65 ab. Hand weakness appears improved but paraparesis persists.  -Neurology following  -s/p Quinton Catheter.  S/p PLEX 3 sessions. Plan for 2 more sessions (PLEX  # 4 today).  Repeat brain MRI unremarkable.   -Benzodiazepine trial. Cont Valium 5mg  q8h.   -Routine Cancer screening as an outpatient.  -Neuro checks.   -PT/OT.     Anemia of chronic disease  -Iron profile reviewed, B12 within normal limits  -Hemoglobin remains stable on Eliquis    UTI  - UA showed ecoli. Due to urinary retention, cw Ciprofloxacin to decrease chance of complicated UTI, pyelonephritis.     Maintain Foley catheter for retention (4/13).   -Failed multiple voiding trials     Hx afib on Eliquis,   -Metop for rate control; increase to 37.5mg  XL BID  -Cont Eliquis    HTN, HLD, DM (A1c 5.3 on 12/2021), COPD, TIAs, COPD, bipolar disorder, [facility] admit 2/7-2/17/23 for rhabdomyolysis (peak CK 4464 - statin d/c'ed) + vomiting ?gastroparesis + starvation ketoacidosis + UTI, [facility] admit 2/18-2/20/23 for rhabdomyolysis (peak CK 2509) + transaminitis + bilateral leg weakness + right paresthesias, admit 3/4-3/13 for bilateral hand/leg weakness and paresthesias  (unclear etiology, suspect myositis s/p IVIG but had facial paresthesias so it was stopped) + leg muscle edema on MRI (no biopsy done) + non-ischemic cardiomyopathy (EF 43%)       DVT/GI prophylaxis - SCDs. Eliquis BID                   Recent Labs  02/04/22  0309 02/03/22  0335 02/02/22  0416   Potassium 3.5 3.3* 3.6  3.7     -Replete per electrolyte protocol  Diagnosis: Hypokalemia        Signed by: Durene Romans, DO, FACP  cc:Malcolm Metro, MD

## 2022-02-04 NOTE — Procedures (Signed)
Time out, Identified patient  Verified Dr's order, consents, allergies and patient's concerns     Procedure  TPE # 4 completed via Rt IJ Quinton CVC without complication  Fluids balance: -92mL  no adverse reaction noted    Replacement Fluid  5% Albumin    Blood and Blood Products  None    Medication  See MAR for details    Access & De-Access of Catheter  Access and De-Access cath by Aseptic technique  NS 10cc flushed on each Cath port and changed new Tego caps with white curos post tx    Dressing of Catheter  Catheter dressing dry and intact  Changed dressing of cath due today and will change by night shift nurse     Labs  pre labs    Vitals  Pt drowsy and aroused easily entire tx  vitals stable throughout procedure  see Flowsheets for details and trends  post BP 106/69, SpO2 97% on RA, Alert and oriented to self post tx, denied complaints    Evaluation/Report  Dr. Norman Clay at bedside for patient evaluation during tx  Given post procedure instruction  report given to Farley Ly, RN    Safety  Safety precaution in place=lowered the bed, Call light within reach and fall mat in place

## 2022-02-05 ENCOUNTER — Inpatient Hospital Stay: Payer: 59

## 2022-02-05 ENCOUNTER — Other Ambulatory Visit: Payer: Self-pay

## 2022-02-05 LAB — CBC AND DIFFERENTIAL
Absolute NRBC: 0 10*3/uL (ref 0.00–0.00)
Basophils Absolute Automated: 0.05 10*3/uL (ref 0.00–0.08)
Basophils Automated: 0.4 %
Eosinophils Absolute Automated: 0.29 10*3/uL (ref 0.00–0.44)
Eosinophils Automated: 2.5 %
Hematocrit: 25.3 % — ABNORMAL LOW (ref 34.7–43.7)
Hgb: 8.1 g/dL — ABNORMAL LOW (ref 11.4–14.8)
Immature Granulocytes Absolute: 0.06 10*3/uL (ref 0.00–0.07)
Immature Granulocytes: 0.5 %
Instrument Absolute Neutrophil Count: 7.6 10*3/uL — ABNORMAL HIGH (ref 1.10–6.33)
Lymphocytes Absolute Automated: 2.75 10*3/uL (ref 0.42–3.22)
Lymphocytes Automated: 24.2 %
MCH: 26.2 pg (ref 25.1–33.5)
MCHC: 32 g/dL (ref 31.5–35.8)
MCV: 81.9 fL (ref 78.0–96.0)
MPV: 9.2 fL (ref 8.9–12.5)
Monocytes Absolute Automated: 0.63 10*3/uL (ref 0.21–0.85)
Monocytes: 5.5 %
Neutrophils Absolute: 7.6 10*3/uL — ABNORMAL HIGH (ref 1.10–6.33)
Neutrophils: 66.9 %
Nucleated RBC: 0 /100 WBC (ref 0.0–0.0)
Platelets: 295 10*3/uL (ref 142–346)
RBC: 3.09 10*6/uL — ABNORMAL LOW (ref 3.90–5.10)
RDW: 18 % — ABNORMAL HIGH (ref 11–15)
WBC: 11.38 10*3/uL — ABNORMAL HIGH (ref 3.10–9.50)

## 2022-02-05 LAB — BASIC METABOLIC PANEL
Anion Gap: 11 (ref 5.0–15.0)
BUN: 7 mg/dL (ref 7.0–21.0)
CO2: 16 mEq/L — ABNORMAL LOW (ref 17–29)
Calcium: 8.4 mg/dL — ABNORMAL LOW (ref 8.5–10.5)
Chloride: 112 mEq/L — ABNORMAL HIGH (ref 99–111)
Creatinine: 0.5 mg/dL (ref 0.4–1.0)
Glucose: 75 mg/dL (ref 70–100)
Potassium: 3.1 mEq/L — ABNORMAL LOW (ref 3.5–5.3)
Sodium: 139 mEq/L (ref 135–145)

## 2022-02-05 LAB — GFR: EGFR: >60

## 2022-02-05 MED ORDER — LACTATED RINGERS IV SOLN
INTRAVENOUS | Status: DC
Start: 2022-02-05 — End: 2022-02-06

## 2022-02-05 MED ORDER — POTASSIUM CHLORIDE 20 MEQ PO PACK
40.0000 meq | PACK | Freq: Once | ORAL | Status: AC
Start: 2022-02-05 — End: 2022-02-05
  Administered 2022-02-05: 40 meq via ORAL
  Filled 2022-02-05: qty 2

## 2022-02-05 NOTE — Consults (Signed)
[facility] [place]  Consultation    Date Time: 02/05/22 11:29 AM  Patient Name: [name], [name]  Medical Record Number: [id number]   Primary Care Physician: Malcolm Metro, MD  Requesting Physician: Durene Romans, DO    Reason for Consultation:     -Hypokalemia     Assessment:     -Hypokalemia likely due to poor oral intake   -Non gap metabolic acidosis    ? RTA  -? Stiff person syndrome    GAD65 antibody +   On TPE  -HTN   -DM II   -HLD   -COPD    Plan:     -Kcl 40 meq one dose today   -start LR at 75 cc/hr   -TPE per neurology   -labs   -supportive care   -encouraged her to eat better     We will follow this patient closely with you. Thank you for allowing [place] to participate in the care of this patient.    Burnis Medin, MD  [employer] Secure Chat  Office - [phone]  History:   [name] is a 66 y.o. 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   Past Medical and Surgical  History:     Past Medical History:   Diagnosis Date    Anemia     Asthma     Atrial fibrillation     Chronic obstructive pulmonary disease     Convulsions     Depression     Diabetes mellitus     diet controlled     Hyperlipidemia     Hypertension     TIA (transient ischemic attack)      Past Surgical History:   Procedure Laterality Date    CARDIAC CATHETERIZATION  2017    HYSTERECTOMY      NON-TUNNELED CATH PLACEMENT [facility]) N/A 01/27/2022    Procedure: Mena Goes;  Surgeon: Verline Lema, MD;  Location: [facility] CARDIAC CATH;  Service: Interventional Radiology;  Laterality: N/A;    pci      TRIPLE LUMEN DIALYSIS CATH N/A 12/28/2021    Procedure: TRIPLE LUMEN DIALYSIS CATH;  Surgeon: Tamala Bari, MD;  Location: [facility] CARDIAC CATH;  Service: Interventional Radiology;  Laterality: N/A;     Family History:     Family History   Problem Relation Age of Onset    Stroke Maternal Grandmother      Social History:     Social History     Socioeconomic History     Marital status: Divorced   Tobacco Use    Smoking status: Never    Smokeless tobacco: Never   Vaping Use    Vaping status: Never Used   Substance and Sexual Activity    Alcohol use: Never    Drug use: Never   Other Topics Concern    Acutrim No    Byetta No    Contrave No    Dexatrim No    Diethylpropion No    Fastin No    Fen - Phen No    Ionamin / Adipex No    Phentermine No    Qsymia No    Prozac No    Saxenda No    Topamax No    Wellbutrin No    Xenical (Orlistat, Alli) No    Other Med No     Social Determinants of Psychologist, prison and probation services  Strain: Low Risk  (01/04/2022)    Overall Financial Resource Strain (CARDIA)     Difficulty of Paying Living Expenses: Not very hard   Food Insecurity: No Food Insecurity (07/12/2020)    Hunger Vital Sign     Worried About Running Out of Food in the Last Year: Never true     Ran Out of Food in the Last Year: Never true   Transportation Needs: No Transportation Needs (01/26/2022)    PRAPARE - Therapist, art (Medical): No     Lack of Transportation (Non-Medical): No   Physical Activity: Insufficiently Active (07/12/2020)    Exercise Vital Sign     Days of Exercise per Week: 2 days     Minutes of Exercise per Session: 20 min   Stress: Stress Concern Present (07/12/2020)    [employer] of Occupational Health - Occupational Stress Questionnaire     Feeling of Stress : To some extent   Social Connections: Moderately Integrated (07/12/2020)    Social Connection and Isolation Panel [NHANES]     Frequency of Communication with Friends and Family: More than three times a week     Frequency of Social Gatherings with Friends and Family: Twice a week     Attends Religious Services: More than 4 times per year     Active Member of [employer] or Organizations: Yes     Attends Engineer, structural: More than 4 times per year     Marital Status: Divorced   Intimate Partner Violence: Not At Risk (07/12/2020)    Humiliation, Afraid, Rape, and Kick questionnaire      Fear of Current or Ex-Partner: No     Emotionally Abused: No     Physically Abused: No     Sexually Abused: No   Housing Stability: Low Risk  (01/04/2022)    Housing Stability Vital Sign     Unable to Pay for Housing in the Last Year: No     Number of Places Lived in the Last Year: 1     Unstable Housing in the Last Year: No     Allergies:     Allergies   Allergen Reactions    Singulair [Montelukast] Itching    Immune Globulin (Human) Itching and Facial Swelling     Pt complain of itchiness and swelling of lips and tongue. Saturation fine, bp elevated.    Myoview [Technetium-27m] Itching    Atorvastatin Other (See Comments)     Pt states she thinks it caused her to have rhabdo    Contrast [Iodinated Contrast Media]     Latex     Magnesium Chloride Itching    Nitroglycerin     Penicillins     Tetracyclines & Related      Medications:     Current Facility-Administered Medications   Medication Dose Route Frequency    apixaban  5 mg Oral Q12H SCH    budesonide-formoterol  2 puff Inhalation BID    ciprofloxacin  500 mg Oral Q12H SCH    diazePAM  2 mg Oral Q8H SCH    heparin (porcine)  5,000 Units Intracatheter Once    heparin (porcine)  5,000 Units Intracatheter Once    heparin (porcine)  5,000 Units Intracatheter Once    heparin (porcine)  5,000 Units Intracatheter Once    lurasidone  120 mg Oral QHS    metoprolol succinate XL  37.5 mg Oral Q12H    midodrine  10 mg Oral BID  Meals    pantoprazole  40 mg Oral QAM    polyethylene glycol  17 g Oral Daily    potassium chloride  40 mEq Oral Once    rOPINIRole  0.5 mg Oral QHS    senna-docusate  2 tablet Oral QHS    tamsulosin  0.4 mg Oral Daily    thiamine  100 mg Oral Daily    vitamin B-6  50 mg Oral Daily    vitamin D  25 mcg Oral Daily    vitamins/minerals  1 tablet Oral Daily    zinc sulfate  220 mg Oral Daily     Review of Systems:   10 systems were reviewed and were negative  Physical Exam:   Temp:  [97.7 F (36.5 C)-98.6 F (37 C)] 97.7 F (36.5 C)  Heart Rate:   [95-114] 105  Resp Rate:  [16-20] 18  BP: (97-129)/(63-87) 97/63  Intake and Output Summary (Last 24 hours) at Date Time    Intake/Output Summary (Last 24 hours) at 02/05/2022 1129  Last data filed at 02/05/2022 0730  Gross per 24 hour   Intake --   Output 600 ml   Net -600 ml       Gen: Well developed, no acute distress  HEENT: NC/AT, moist mucous membranes  Eyes: Anicteric sclera, PERRLA  Neck: No JVD, Supple, no lymphadenopathy  CV: S1 S2 N RRR no M/R/G/C, no edema  Chest: Good effort, CTAB, symmetric expansion  Ab: ND NT Soft no HSM +BS  Skin: Dry, intact  Extremity: No lymphadenopathy  Psych: Appropriate mood and affect, alert and oriented to person, place, time  Labs:     Recent Labs   Lab 02/05/22  0251 02/04/22  0309 02/03/22  0335 02/02/22  0416 02/01/22  0446 01/31/22  0117 01/30/22  0503   Glucose 75 83 102*  More results in Results Review 102*  More results in Results Review 111*   BUN 7.0 6.0* 6.0*  More results in Results Review 5.0*  More results in Results Review <3.0*   Creatinine 0.5 0.5 0.5  More results in Results Review 0.5  More results in Results Review 0.5   Calcium 8.4* 9.0 8.7  More results in Results Review 8.5  More results in Results Review 8.6   Sodium 139 139 138  More results in Results Review 140  More results in Results Review 138   Potassium 3.1* 3.5 3.3*  More results in Results Review 3.2*  More results in Results Review 4.0   Chloride 112* 108 111  More results in Results Review 112*  More results in Results Review 109   CO2 16* 21 18  More results in Results Review 22  More results in Results Review 23   Albumin  --   --   --   --   --   --  3.7   Magnesium  --   --   --   --  1.7  --   --    More results in Results Review = values in this interval not displayed.     Recent Labs   Lab 02/05/22  0251 02/04/22  0309 02/03/22  0335   WBC 11.38* 10.15* 10.14*   RBC 3.09* 3.08* 3.08*   Hgb 8.1* 8.0* 8.0*   Hematocrit 25.3* 25.0* 24.8*   MCV 81.9 81.2 80.5   MCH 26.2 26.0 26.0   MCHC  32.0 32.0 32.3   RDW 18* 19* 18*   MPV  9.2 9.1 9.4   Platelets 295 309 307     Radiology:   Radiological Procedure personally reviewed.   EKG:   Reviewed  Prior Records:   I reviewed the old records.

## 2022-02-05 NOTE — Progress Notes (Signed)
[facility] HOSPITALIST PROGRESS NOTE    Date Time: 02/05/22 10:48 AM  Patient Name: [name]  Attending Physician: Durene Romans, DO        History of Presenting Illness and Interval History/24 hour Events:   HPI per Admitting Provider  " Chrishelle Zito is a 66 y.o. female hx afib on Eliquis, HTN, HLD, DM, COPD, TIAs, COPD, bipolar disorder, [facility] admit 2/7-2/17/23 for rhabdomyolysis (peak CK 4464 - statin d/c'ed) + vomiting ?gastroparesis + starvation ketoacidosis + UTI, [facility] admit 2/18-2/20/23 for rhabdomyolysis (peak CK 2509) + transaminitis + bilateral leg weakness + right paresthesias, admit 3/4-3/13 for bilateral hand/leg weakness and paresthesias  (unclear etiology, suspect myositis s/p IVIG but had facial paresthesias so it was stopped) + leg muscle edema on MRI (no biopsy done) + non-ischemic cardiomyopathy (EF 43%) who presents to the hospital with ongoing paraparesis. She was at [facility] rehab and her hand strength improved somewhat but she is still paraparetic. She was discharged to [facility] today but his neurologist Dr. 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 These symptoms are sudden onset, severe intensity, without alleviating factors.  "    Subjective 4/6: NAE. Patient reports no new focal complaints. Continues to show improvement with weakens, but sill reports limb stiffness.      Subjective 4/7: NAE. Patient reports improved mobility today after starting valium yesterday.    Subjective 4/8: NAE. Patient without focal complaints, tolerated PLEX yesterday  Subjective 4/9: Reports improved mobility in LE.   Subjective 4/10: Receiving PLEX #2 this AM. NO focal compaints, sleepy   Subjective 4/11: No acute events.  Patient tolerated Plex well yesterday.  Complains of drooling from left  side of her mouth past 3 to 4 days  4/12:  No acute events. Pt denied any fever, chills, sob, chest pain  4/13: No acute events. Pt denied any fever, chills, sob, chest pain  4/14:  No acute events. Pt denied any fever, chills, sob, chest pain  4/15: No acute events. Pt denied any fever, chills, sob, chest pain          Physical Exam:     Vitals:    02/05/22 0730   BP: 97/63   Pulse: (!) 105   Resp: 18   Temp: 97.7 F (36.5 C)   SpO2: 98%       Intake and Output Summary (Last 24 hours) at Date Time    Intake/Output Summary (Last 24 hours) at 02/05/2022 1048  Last data filed at 02/05/2022 0730  Gross per 24 hour   Intake --   Output 600 ml   Net -600 ml         General: Awake, alert, oriented  HEENT: NC/AT.  MMM  Neck: supple  Cardiovascular: regular rate and rhythm, no murmurs, rubs or gallops  Lungs: clear to auscultation bilaterally, without wheezing, rhonchi, or rales  Abdomen: soft, non-tender, non-distended; normoactive bowel sounds, no rebound or guarding  Extremities: increased tone LLE>RLE.  (Overall LLE tone improved)  Neuro: Mild left facial droop, strength 5/5 in upper and 4/5 lower extremities, sensation intact except paraesthesias in hands/feet  Skin: no rashes or lesions noted      Medications:     Current Facility-Administered Medications   Medication Dose Route Frequency    apixaban  5  mg Oral Q12H SCH    budesonide-formoterol  2 puff Inhalation BID    ciprofloxacin  500 mg Oral Q12H SCH    diazePAM  2 mg Oral Q8H SCH    heparin (porcine)  5,000 Units Intracatheter Once    heparin (porcine)  5,000 Units Intracatheter Once    heparin (porcine)  5,000 Units Intracatheter Once    heparin (porcine)  5,000 Units Intracatheter Once    lurasidone  120 mg Oral QHS    metoprolol succinate XL  37.5 mg Oral Q12H    midodrine  10 mg Oral BID Meals    pantoprazole  40 mg Oral QAM    polyethylene glycol  17 g Oral Daily    rOPINIRole  0.5 mg Oral QHS    senna-docusate  2 tablet Oral QHS    tamsulosin  0.4 mg Oral  Daily    thiamine  100 mg Oral Daily    vitamin B-6  50 mg Oral Daily    vitamin D  25 mcg Oral Daily    vitamins/minerals  1 tablet Oral Daily    zinc sulfate  220 mg Oral Daily         Labs:     Results       Procedure Component Value Units Date/Time    Basic Metabolic Panel [[id number]]  (Abnormal) Collected: 02/05/22 0251    Specimen: Blood Updated: 02/05/22 0353     Glucose 75 mg/dL      BUN 7.0 mg/dL      Creatinine 0.5 mg/dL      Calcium 8.4 mg/dL      Sodium 409 mEq/L      Potassium 3.1 mEq/L      Chloride 112 mEq/L      CO2 16 mEq/L      Anion Gap 11.0    GFR [[id number]] Collected: 02/05/22 0251     Updated: 02/05/22 0353     EGFR >60.0    CBC and differential [[id number]]  (Abnormal) Collected: 02/05/22 0251    Specimen: Blood Updated: 02/05/22 0345     WBC 11.38 x10 3/uL      Hgb 8.1 g/dL      Hematocrit 57.8 %      Platelets 295 x10 3/uL      RBC 3.09 x10 6/uL      MCV 81.9 fL      MCH 26.2 pg      MCHC 32.0 g/dL      RDW 18 %      MPV 9.2 fL      Instrument Absolute Neutrophil Count 7.60 x10 3/uL      Neutrophils 66.9 %      Lymphocytes Automated 24.2 %      Monocytes 5.5 %      Eosinophils Automated 2.5 %      Basophils Automated 0.4 %      Immature Granulocytes 0.5 %      Nucleated RBC 0.0 /100 WBC      Neutrophils Absolute 7.60 x10 3/uL      Lymphocytes Absolute Automated 2.75 x10 3/uL      Monocytes Absolute Automated 0.63 x10 3/uL      Eosinophils Absolute Automated 0.29 x10 3/uL      Basophils Absolute Automated 0.05 x10 3/uL      Immature Granulocytes Absolute 0.06 x10 3/uL      Absolute NRBC 0.00 x10 3/uL     Calcium, ionized [[id number]] Collected: 02/04/22 1158    Specimen:  Blood Updated: 02/04/22 1222     Calcium, Ionized 2.57 mEq/L     Narrative:      Pre-procedure if AM lab not done                  @A1c @    Radiology:     Radiology Results (24 Hour)       ** No results found for the last 24 hours. **            Lines  Patient Lines/Drains/Airways Status       Active PICC Line / CVC Line / PIV  Line / Drain / Airway / Intraosseous Line / Epidural Line / ART Line / Line / Wound / Pressure Ulcer / NG/OG Tube       Name Placement date Placement time Site Days    Urethral Catheter Non-latex;Straight-tip 16 Fr. 01/23/22  1457  Non-latex;Straight-tip  3    Wound 01/03/22 Stage 1 Coccyx;Sacrum unblanchable redness 01/03/22  2200  Coccyx;Sacrum  23                  Urethral Catheter Non-latex;Straight-tip 16 Fr. (Active)   Catheter necessity reviewed? Yes 01/27/22 1100   Site Assessment WDL 01/27/22 0800   Pericare (With Urinary Catheter) Yes 01/27/22 1100   Collection Container Standard drainage bag with urimeter 01/27/22 1100   Securement Method Securement device 01/27/22 1100   Reason for Continuing Urinary Catheterization past POD 1 Acute urinary retention due to nerve injury 01/27/22 0800   Positioned catheter tubing for unobstructed urine flow: Yes 01/27/22 1100   Urine Output (mL) 300 mL 01/26/22 1500   Urine Output (mL) Total 300 mL 01/26/22 1500   Number of days: 3         Assessment:     Patient Active Problem List   Diagnosis    CVA (cerebral vascular accident)    Chest pain    Chest pain with high risk of acute coronary syndrome    Paroxysmal atrial fibrillation    Hypertension    Hyperlipidemia    Seizure disorder    History of gastrointestinal hemorrhage    Depression    Anemia    Asthma    Non-traumatic subcutaneous emphysema    Chest pain with moderate risk for cardiac etiology    Left-sided weakness    History of asthma    History of transient ischemic attack (TIA)    Type 2 diabetes mellitus    Syncope and collapse    Weakness of both legs    Hypokalemia    Thoracic back pain    Gastroparesis    Guillain Barr syndrome    Myositis    Stiff person syndrome     66 y.o. female hx afib on Eliquis, HTN, HLD, DM, COPD, TIAs, bipolar disorder, [facility] admit 2/7-2/17/23 for rhabdomyolysis (peak CK 4464 - statin d/c'ed) + vomiting ?gastroparesis + starvation ketoacidosis + UTI, [facility] admit 2/18-2/20/23  for rhabdomyolysis (peak CK 2509) + transaminitis + bilateral leg weakness + right paresthesias, admit 3/4-3/13 for bilateral hand/leg weakness and paresthesias  (unclear etiology, suspect myositis s/p IVIG but had facial paresthesias so it was stopped) + leg muscle edema on MRI (no biopsy done) + non-ischemic cardiomyopathy (EF 43%) who presents with ongoing paraparesis with +GAD65, suspecting stiff person syndrome.   Plan:   Stiff Person Syndrome  - in setting of +GAD65 ab. Hand weakness appears improved but paraparesis persists.  -Neurology following  -s/p [place] Catheter.  S/p PLEX  4 sessions. Last session (5th) on Monday.  Repeat brain MRI unremarkable.   -Benzodiazepine trial. Cont Valium 5mg  q8h.   -Routine Cancer screening as an outpatient.  -Neuro checks.   -PT/OT.     Anemia of chronic disease  -Iron profile reviewed, B12 within normal limits  -Hemoglobin remains stable on Eliquis    UTI  - UA showed ecoli. Due to urinary retention, cw Ciprofloxacin to decrease chance of complicated UTI, pyelonephritis.     Maintain Foley catheter for retention (4/13).   -Failed multiple voiding trials     Hx afib on Eliquis,   -Metop for rate control; increase to 37.5mg  XL BID  -Cont Eliquis    HTN, HLD, DM (A1c 5.3 on 12/2021), COPD, TIAs, COPD, bipolar disorder, [facility] admit 2/7-2/17/23 for rhabdomyolysis (peak CK 4464 - statin d/c'ed) + vomiting ?gastroparesis + starvation ketoacidosis + UTI, [facility] admit 2/18-2/20/23 for rhabdomyolysis (peak CK 2509) + transaminitis + bilateral leg weakness + right paresthesias, admit 3/4-3/13 for bilateral hand/leg weakness and paresthesias  (unclear etiology, suspect myositis s/p IVIG but had facial paresthesias so it was stopped) + leg muscle edema on MRI (no biopsy done) + non-ischemic cardiomyopathy (EF 43%)      DVT/GI prophylaxis - SCDs. Eliquis BID                     Recent Labs     02/05/22  0251 02/04/22  0309 02/03/22  0335   Potassium 3.1* 3.5 3.3*     -Replete per  electrolyte protocol  Diagnosis: Hypokalemia        Signed by: Durene Romans, DO, FACP  cc:Malcolm Metro, MD

## 2022-02-05 NOTE — Plan of Care (Signed)
NURSING PROGRESS NOTE: STROKE UNIT    Patient Name: [name] (66 y.o. female)  Admission Date: 01/26/2022 [facility] Day 10)      Recent Labs   Lab 02/05/22  0251   Sodium 139   Potassium 3.1*   Chloride 112*   CO2 16*   BUN 7.0   Creatinine 0.5   EGFR >60.0   Glucose 75   Calcium 8.4*       Recent Labs   Lab 02/05/22  0251   WBC 11.38*   Hgb 8.1*   Hematocrit 25.3*   Platelets 295         Patient Lines/Drains/Airways Status       Active Lines, Drains and Airways       Name Placement date Placement time Site Days    Temporary Catheter - Non-Tunneled 01/27/22 Internal Jugular Right 01/27/22  1525  -- 9    Midline IV 01/27/22 Anterior;Left Upper Arm 01/27/22  1738  Upper Arm  9    Urethral Catheter Non-latex 16 Fr. 02/03/22  1200  Non-latex  2                       Braden Scale Score: 18 (02/05/22 0800)      Skin Integrity: Scars, Blanchable Redness, Bruising  Bruising Skin Location: scattered    Wound 01/03/22 Stage 1 Coccyx;Sacrum unblanchable redness (Active)   Date First Assessed/Time First Assessed: 01/03/22 2200   Pressure Injury Staging (WOCN/ Trained RNs Only): Stage 1  Location: Coccyx;Sacrum  Wound Description (Comments): unblanchable redness  Present on Admission: Yes      Assessments 01/03/2022 11:00 PM 02/04/2022 10:00 PM   Site Description Clean;Dry;Intact Intact   Peri-wound Description Clean;Dry --   Closure Open to air --   Treatments Site care --   Dressing Foam --   Dressing Changed New --   Dressing Status Clean;Dry;Intact --       No associated orders.       Puncture Site 01/27/22 Venous Anterior;Right Neck (Active)   Date First Assessed/Time First Assessed: 01/27/22 1518   Puncture site location: Venous  Wound Location Orientation: Anterior;Right  Location: Neck      Assessments 01/27/2022  3:28 PM 02/05/2022  8:00 AM   Site Assessment Clean;Dry;Intact;Soft;Non Tender Clean;Dry;Intact   Dressing Status Dry;Intact Clean;Dry;Intact   Dressing Changed New --   Drainage Amount None --       No associated  orders.       Safety Checklist   1:1 Sitter N    Avasys N       Interpreter Services:  Does the patient require an Interpreter? N    If yes, what form of interpreter services was used? N/A     If family was utilized, is interpreter waiver form signed and in the chart? N/A      ASSESSMENT/PLAN:    Last BM: 4/14    Pending Orders: PLEX 5/5 on Monday 4/17, [place] of BLE    Discharge Plan: SNF    POC / Family Update: Pt w/ RN, updated daughter at bedside    Shift Note:  A/Ox4. Speech clear and slightly delayed. Follows commands. MAE: moderate and can overcome resistance, intermittent tingling in arms and feet. Stand-pivot to chair w/ walker and x1 assist. RA, lung sounds clear to diminished. No tele. Continent to BM, foley in place. Foley care completed this shift. Tolerating regular diet, takes pills whole with thins. C/o itchiness, requesting IV benadryl, x1 given.  LR running @ 33mL/hr.    Fall precautions in place: bed in lowest position, call light within reach, fall mat in place, bed alarm on.\  PPE worn in patient's room: surgical mask, gloves     Problem: Compromised Tissue integrity  Goal: Damaged tissue is healing and protected  Outcome: Progressing  Flowsheets (Taken 02/05/2022 0718)  Damaged tissue is healing and protected:   Monitor/assess Braden scale every shift   Relieve pressure to bony prominences for patients at moderate and high risk   Provide wound care per wound care algorithm   Reposition patient every 2 hours and as needed unless able to reposition self   Increase activity as tolerated/progressive mobility   Avoid shearing injuries   Keep intact skin clean and dry   Use bath wipes, not soap and water, for daily bathing   Use incontinence wipes for cleaning urine, stool and caustic drainage. Foley care as needed   Monitor external devices/tubes for correct placement to prevent pressure, friction and shearing   Encourage use of lotion/moisturizer on skin   Monitor patient's hygiene practices      Problem: Neurological Deficit  Goal: Neurological status is stable or improving  Outcome: Progressing  Flowsheets (Taken 02/05/2022 0718)  Neurological status is stable or improving:   Monitor/assess/document neurological assessment (Stroke: every 4 hours)   Monitor/assess NIH Stroke Scale   Re-assess NIH Stroke Scale for any change in status   Observe for seizure activity and initiate seizure precautions if indicated   Perform CAM Assessment     Problem: Peripheral Neurovascular Impairment  Goal: Extremity color, movement, sensation are maintained or improved  Outcome: Progressing  Flowsheets (Taken 02/05/2022 0718)  Extremity color, movement, sensation are maintained or improved:   Increase mobility as tolerated/progressive mobility   Assess and monitor application of corrective devices (cast, brace, splint), check skin integrity   Assess extremity for proper alignment   Teach/review/reinforce ankle pump exercises   VTE Prevention: Administer anticoagulant(s) and/or apply anti-embolism stockings/devices as ordered     Problem: Impaired Mobility  Goal: Mobility/Activity is maintained at optimal level for patient  Outcome: Progressing  Flowsheets (Taken 02/05/2022 0718)  Mobility/activity is maintained at optimal level for patient:   Increase mobility as tolerated/progressive mobility   Encourage independent activity per ability   Maintain proper body alignment   Perform active/passive ROM   Plan activities to conserve energy, plan rest periods   Reposition patient every 2 hours and as needed unless able to reposition self   Assess for changes in respiratory status, level of consciousness and/or development of fatigue   Consult/collaborate with Physical Therapy and/or Occupational Therapy

## 2022-02-05 NOTE — Procedures (Addendum)
APHERESIS PROCEDURE NOTE - Therapeutic Plasmapheresis # 3    Date Time: 02/04/2022 5:20 PM  Patient Name: [name], [name]  MRN: [id number]  DOB: 02-Mar-1956  Sex: female  Procedure: Therapeutic Plasma Exchange (Plasmapheresis)  Performing Provider: Dorice Lamas, MD, MD  Performing RN: Gillie Manners, RN    Consent:      This procedure has been fully reviewed with the patient and written informed consent has been obtained.  *Consent in the file.    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 She has h/o afib (on eliquis), HTN, HLD, DM, COPD, TIA, bipolar d/o, recent rhabdomyolysis (02/23), recent [facility] admission 3/13-01/26/22 for weakness and discharged to acute rehab 01/26/22. Dr. Joyce Gross requested for PLEX.   Tolerated PLEX with no acute problems or adverse reactions. Stable vital signs.    Past Medical History:     Active Ambulatory Problems     Diagnosis Date Noted    CVA (cerebral vascular accident) 08/21/2018    Chest pain 08/22/2018    Chest pain with high risk of acute coronary syndrome 01/06/2019    Paroxysmal atrial fibrillation 01/06/2019    Hypertension 01/06/2019    Hyperlipidemia 01/06/2019    Seizure disorder 01/06/2019    History of gastrointestinal hemorrhage 01/06/2019    Depression 01/06/2019    Anemia 01/06/2019    Asthma 01/06/2019    Non-traumatic subcutaneous emphysema 01/07/2019    Chest pain with moderate risk for cardiac etiology 05/11/2019    Left-sided weakness 05/20/2019    History of asthma 06/16/2019    History of transient ischemic attack (TIA) 06/16/2019    Type 2 diabetes mellitus 06/16/2019    Syncope and collapse 06/16/2019    Weakness of both legs 12/25/2021    Hypokalemia 12/26/2021    Thoracic back pain 12/26/2021    Gastroparesis 12/26/2021    Guillain Barr syndrome 12/25/2021    Myositis  01/03/2022     Resolved Ambulatory Problems     Diagnosis Date Noted    No Resolved Ambulatory Problems     Past Medical History:   Diagnosis Date    Atrial fibrillation     Chronic obstructive pulmonary disease     Convulsions     Diabetes mellitus     TIA (transient ischemic attack)        Past Surgical History:     Past Surgical History:   Procedure Laterality Date    CARDIAC CATHETERIZATION  2017    HYSTERECTOMY      NON-TUNNELED CATH PLACEMENT [facility]) N/A 01/27/2022    Procedure: Mena Goes;  Surgeon: Verline Lema, MD;  Location: [facility] CARDIAC CATH;  Service: Interventional Radiology;  Laterality: N/A;    pci      TRIPLE LUMEN DIALYSIS CATH N/A 12/28/2021    Procedure: TRIPLE LUMEN DIALYSIS CATH;  Surgeon: Tamala Bari, MD;  Location: [facility] CARDIAC CATH;  Service: Interventional Radiology;  Laterality: N/A;       Allergies:     Allergies   Allergen Reactions    Singulair [Montelukast] Itching    Immune Globulin (Human) Itching and Facial Swelling     Pt complain of itchiness and swelling of lips and tongue. Saturation fine, bp elevated.  Myoview [Technetium-13m] Itching    Atorvastatin Other (See Comments)     Pt states she thinks it caused her to have rhabdo    Contrast [Iodinated Contrast Media]     Latex     Magnesium Chloride Itching    Nitroglycerin     Penicillins     Tetracyclines & Related          Physical Exam:   Temp:  [97.7 F (36.5 C)-98.6 F (37 C)] 98 F (36.7 C)  Heart Rate:  [100-114] 111  Resp Rate:  [16-20] 18  BP: (97-126)/(63-87) 118/72    Resting comfortably with stable vitals during PLEX     Lab:     Results       Procedure Component Value Units Date/Time    Basic Metabolic Panel [[id number]]  (Abnormal) Collected: 02/05/22 0251    Specimen: Blood Updated: 02/05/22 0353     Glucose 75 mg/dL      BUN 7.0 mg/dL      Creatinine 0.5 mg/dL      Calcium 8.4 mg/dL      Sodium 671 mEq/L      Potassium 3.1 mEq/L      Chloride 112 mEq/L      CO2 16 mEq/L      Anion Gap 11.0    GFR [[id number]] Collected:  02/05/22 0251     Updated: 02/05/22 0353     EGFR >60.0    CBC and differential [[id number]]  (Abnormal) Collected: 02/05/22 0251    Specimen: Blood Updated: 02/05/22 0345     WBC 11.38 x10 3/uL      Hgb 8.1 g/dL      Hematocrit 67.3 %      Platelets 295 x10 3/uL      RBC 3.09 x10 6/uL      MCV 81.9 fL      MCH 26.2 pg      MCHC 32.0 g/dL      RDW 18 %      MPV 9.2 fL      Instrument Absolute Neutrophil Count 7.60 x10 3/uL      Neutrophils 66.9 %      Lymphocytes Automated 24.2 %      Monocytes 5.5 %      Eosinophils Automated 2.5 %      Basophils Automated 0.4 %      Immature Granulocytes 0.5 %      Nucleated RBC 0.0 /100 WBC      Neutrophils Absolute 7.60 x10 3/uL      Lymphocytes Absolute Automated 2.75 x10 3/uL      Monocytes Absolute Automated 0.63 x10 3/uL      Eosinophils Absolute Automated 0.29 x10 3/uL      Basophils Absolute Automated 0.05 x10 3/uL      Immature Granulocytes Absolute 0.06 x10 3/uL      Absolute NRBC 0.00 x10 3/uL     Calcium, ionized [[id number]] Collected: 02/04/22 1158    Specimen: Blood Updated: 02/04/22 1222     Calcium, Ionized 2.57 mEq/L     Narrative:      Pre-procedure if AM lab not done    Fibrinogen [[id number]]  (Abnormal) Collected: 02/04/22 1019    Specimen: Blood Updated: 02/04/22 1047     Fibrinogen 170 mg/dL     GFR [[id number]] Collected: 02/04/22 0309     Updated: 02/04/22 0501     EGFR >60.0    Basic Metabolic Panel [[id number]]  (Abnormal) Collected: 02/04/22 0309  Specimen: Blood Updated: 02/04/22 0501     Glucose 83 mg/dL      BUN 6.0 mg/dL      Creatinine 0.5 mg/dL      Calcium 9.0 mg/dL      Sodium 161 mEq/L      Potassium 3.5 mEq/L      Chloride 108 mEq/L      CO2 21 mEq/L      Anion Gap 10.0    CBC and differential [[id number]]  (Abnormal) Collected: 02/04/22 0309    Specimen: Blood Updated: 02/04/22 0439     WBC 10.15 x10 3/uL      Hgb 8.0 g/dL      Hematocrit 81.1 %      Platelets 309 x10 3/uL      RBC 3.08 x10 6/uL      MCV 81.2 fL      MCH 26.0 pg      MCHC 32.0  g/dL      RDW 19 %      MPV 9.1 fL      Instrument Absolute Neutrophil Count 6.06 x10 3/uL      Neutrophils 59.7 %      Lymphocytes Automated 30.3 %      Monocytes 5.7 %      Eosinophils Automated 3.3 %      Basophils Automated 0.5 %      Immature Granulocytes 0.5 %      Nucleated RBC 0.0 /100 WBC      Neutrophils Absolute 6.06 x10 3/uL      Lymphocytes Absolute Automated 3.08 x10 3/uL      Monocytes Absolute Automated 0.58 x10 3/uL      Eosinophils Absolute Automated 0.33 x10 3/uL      Basophils Absolute Automated 0.05 x10 3/uL      Immature Granulocytes Absolute 0.05 x10 3/uL      Absolute NRBC 0.00 x10 3/uL     Basic Metabolic Panel [[id number]]  (Abnormal) Collected: 02/03/22 0335    Specimen: Blood Updated: 02/03/22 0447     Glucose 102 mg/dL      BUN 6.0 mg/dL      Creatinine 0.5 mg/dL      Calcium 8.7 mg/dL      Sodium 213 mEq/L      Potassium 3.3 mEq/L      Chloride 111 mEq/L      CO2 18 mEq/L      Anion Gap 9.0    GFR [[id number]] Collected: 02/03/22 0335     Updated: 02/03/22 0447     EGFR >60.0    CBC and differential [[id number]]  (Abnormal) Collected: 02/03/22 0335    Specimen: Blood Updated: 02/03/22 0433     WBC 10.14 x10 3/uL      Hgb 8.0 g/dL      Hematocrit 24.4 %      Platelets 307 x10 3/uL      RBC 3.08 x10 6/uL      MCV 80.5 fL      MCH 26.0 pg      MCHC 32.3 g/dL      RDW 18 %      MPV 9.4 fL      Instrument Absolute Neutrophil Count 5.93 x10 3/uL      Neutrophils 58.4 %      Lymphocytes Automated 31.5 %      Monocytes 5.6 %      Eosinophils Automated 3.6 %      Basophils Automated 0.5 %  Immature Granulocytes 0.4 %      Nucleated RBC 0.0 /100 WBC      Neutrophils Absolute 5.93 x10 3/uL      Lymphocytes Absolute Automated 3.19 x10 3/uL      Monocytes Absolute Automated 0.57 x10 3/uL      Eosinophils Absolute Automated 0.36 x10 3/uL      Basophils Absolute Automated 0.05 x10 3/uL      Immature Granulocytes Absolute 0.04 x10 3/uL      Absolute NRBC 0.00 x10 3/uL     Basic Metabolic Panel  [[id number]]  (Abnormal) Collected: 02/02/22 0416    Specimen: Blood Updated: 02/02/22 0615     Glucose 93 mg/dL      BUN 5.0 mg/dL      Creatinine 0.5 mg/dL      Calcium 8.8 mg/dL      Sodium 027 mEq/L      Potassium 3.6 mEq/L      Chloride 110 mEq/L      CO2 23 mEq/L      Anion Gap 7.0    GFR [[id number]] Collected: 02/02/22 0416     Updated: 02/02/22 0615     EGFR >60.0    Fibrinogen [[id number]]  (Abnormal) Collected: 02/02/22 0416    Specimen: Blood Updated: 02/02/22 0605     Fibrinogen 169 mg/dL     Narrative:      Pre-procedure, If AM lab not done  Pre-procedure if AM lab not done    Basic Metabolic Panel [[id number]]  (Abnormal) Collected: 02/02/22 0416    Specimen: Blood Updated: 02/02/22 0602     Glucose 89 mg/dL      BUN 5.0 mg/dL      Creatinine 0.5 mg/dL      Calcium 8.5 mg/dL      Sodium 518 mEq/L      Potassium 3.7 mEq/L      Chloride 110 mEq/L      CO2 23 mEq/L      Anion Gap 7.0    Narrative:      Pre-procedure, If AM lab not done  Pre-procedure if AM lab not done    GFR [[id number]] Collected: 02/02/22 0416     Updated: 02/02/22 0602     EGFR >60.0    Narrative:      Pre-procedure, If AM lab not done  Pre-procedure if AM lab not done    CBC and differential [[id number]]  (Abnormal) Collected: 02/02/22 0416    Specimen: Blood Updated: 02/02/22 0550     WBC 8.75 x10 3/uL      Hgb 8.0 g/dL      Hematocrit 55.7 %      Platelets 311 x10 3/uL      RBC 3.03 x10 6/uL      MCV 81.5 fL      MCH 26.4 pg      MCHC 32.4 g/dL      RDW 18 %      MPV 9.4 fL      Instrument Absolute Neutrophil Count 4.98 x10 3/uL      Neutrophils 56.9 %      Lymphocytes Automated 31.3 %      Monocytes 7.2 %      Eosinophils Automated 3.5 %      Basophils Automated 0.6 %      Immature Granulocytes 0.5 %      Nucleated RBC 0.0 /100 WBC      Neutrophils Absolute 4.98 x10 3/uL      Lymphocytes Absolute  Automated 2.74 x10 3/uL      Monocytes Absolute Automated 0.63 x10 3/uL      Eosinophils Absolute Automated 0.31 x10 3/uL      Basophils  Absolute Automated 0.05 x10 3/uL      Immature Granulocytes Absolute 0.04 x10 3/uL      Absolute NRBC 0.00 x10 3/uL     Calcium, ionized [[id number]] Collected: 02/02/22 0416    Specimen: Blood Updated: 02/02/22 0455     Calcium, Ionized 2.53 mEq/L     Narrative:      Pre-procedure if AM lab not done            Procedure Notes:     Fluid replacement: 5% albumin    Fluid Balance:   See nursing flowsheet    Complications:   None    Signed by: Dorice Lamas, MD, MD  Date/Time: 02/05/2022 3:28 PM

## 2022-02-06 LAB — GFR: EGFR: >60

## 2022-02-06 LAB — CBC AND DIFFERENTIAL
Absolute NRBC: 0 10*3/uL (ref 0.00–0.00)
Basophils Absolute Automated: 0.04 10*3/uL (ref 0.00–0.08)
Basophils Automated: 0.4 %
Eosinophils Absolute Automated: 0.23 10*3/uL (ref 0.00–0.44)
Eosinophils Automated: 2.1 %
Hematocrit: 23.9 % — ABNORMAL LOW (ref 34.7–43.7)
Hgb: 7.7 g/dL — ABNORMAL LOW (ref 11.4–14.8)
Immature Granulocytes Absolute: 0.05 10*3/uL (ref 0.00–0.07)
Immature Granulocytes: 0.4 %
Instrument Absolute Neutrophil Count: 7.91 10*3/uL — ABNORMAL HIGH (ref 1.10–6.33)
Lymphocytes Absolute Automated: 2.35 10*3/uL (ref 0.42–3.22)
Lymphocytes Automated: 21.1 %
MCH: 26.4 pg (ref 25.1–33.5)
MCHC: 32.2 g/dL (ref 31.5–35.8)
MCV: 81.8 fL (ref 78.0–96.0)
MPV: 9.1 fL (ref 8.9–12.5)
Monocytes Absolute Automated: 0.54 10*3/uL (ref 0.21–0.85)
Monocytes: 4.9 %
Neutrophils Absolute: 7.91 10*3/uL — ABNORMAL HIGH (ref 1.10–6.33)
Neutrophils: 71.1 %
Nucleated RBC: 0 /100 WBC (ref 0.0–0.0)
Platelets: 327 10*3/uL (ref 142–346)
RBC: 2.92 10*6/uL — ABNORMAL LOW (ref 3.90–5.10)
RDW: 18 % — ABNORMAL HIGH (ref 11–15)
WBC: 11.12 10*3/uL — ABNORMAL HIGH (ref 3.10–9.50)

## 2022-02-06 LAB — BASIC METABOLIC PANEL
Anion Gap: 12 (ref 5.0–15.0)
BUN: 5 mg/dL — ABNORMAL LOW (ref 7.0–21.0)
CO2: 18 mEq/L (ref 17–29)
Calcium: 8.5 mg/dL (ref 8.5–10.5)
Chloride: 107 mEq/L (ref 99–111)
Creatinine: 0.5 mg/dL (ref 0.4–1.0)
Glucose: 88 mg/dL (ref 70–100)
Potassium: 3.7 mEq/L (ref 3.5–5.3)
Sodium: 137 mEq/L (ref 135–145)

## 2022-02-06 NOTE — Progress Notes (Signed)
[facility] HOSPITALIST PROGRESS NOTE    Date Time: 02/06/22 10:25 AM  Patient Name: [name]  Attending Physician: Durene Romans, DO        History of Presenting Illness and Interval History/24 hour Events:   HPI per Admitting Provider  " [name] is a 66 y.o. female hx afib on Eliquis, HTN, HLD, DM, COPD, TIAs, COPD, bipolar disorder, [facility] admit 2/7-2/17/23 for rhabdomyolysis (peak CK 4464 - statin d/c'ed) + vomiting ?gastroparesis + starvation ketoacidosis + UTI, [facility] admit 2/18-2/20/23 for rhabdomyolysis (peak CK 2509) + transaminitis + bilateral leg weakness + right paresthesias, admit 3/4-3/13 for bilateral hand/leg weakness and paresthesias  (unclear etiology, suspect myositis s/p IVIG but had facial paresthesias so it was stopped) + leg muscle edema on MRI (no biopsy done) + non-ischemic cardiomyopathy (EF 43%) who presents to the hospital with ongoing paraparesis. She was at [facility] rehab and her hand strength improved somewhat but she is still paraparetic. She was discharged to [facility] today but his neurologist Dr. 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 These symptoms are sudden onset, severe intensity, without alleviating factors.  "    Subjective 4/6: NAE. Patient reports no new focal complaints. Continues to show improvement with weakens, but sill reports limb stiffness.      Subjective 4/7: NAE. Patient reports improved mobility today after starting valium yesterday.    Subjective 4/8: NAE. Patient without focal complaints, tolerated PLEX yesterday  Subjective 4/9: Reports improved mobility in LE.   Subjective 4/10: Receiving PLEX #2 this AM. NO focal compaints, sleepy   Subjective 4/11: No acute events.  Patient tolerated Plex well yesterday.  Complains of drooling from left  side of her mouth past 3 to 4 days  4/12:  No acute events. Pt denied any fever, chills, sob, chest pain  4/13: No acute events. Pt denied any fever, chills, sob, chest pain  4/14:  No acute events. Pt denied any fever, chills, sob, chest pain  4/15: No acute events. Pt denied any fever, chills, sob, chest pain  4/16: No acute events. Pt denied any fever, chills, sob, chest pain. She doesn't have much appetite.           Physical Exam:     Vitals:    02/06/22 0735   BP: 109/74   Pulse: (!) 108   Resp: 18   Temp: 97.7 F (36.5 C)   SpO2: 100%       Intake and Output Summary (Last 24 hours) at Date Time    Intake/Output Summary (Last 24 hours) at 02/06/2022 1025  Last data filed at 02/06/2022 0800  Gross per 24 hour   Intake --   Output 800 ml   Net -800 ml         General: Awake, alert, oriented  HEENT: NC/AT.  MMM  Neck: supple  Cardiovascular: regular rate and rhythm, no murmurs, rubs or gallops  Lungs: clear to auscultation bilaterally, without wheezing, rhonchi, or rales  Abdomen: soft, non-tender, non-distended; normoactive bowel sounds, no rebound or guarding  Extremities: increased tone LLE>RLE.  (Overall LLE tone improved)  Neuro: Mild left facial droop, strength 5/5 in upper and 4/5 lower extremities, sensation intact except paraesthesias in hands/feet  Skin: no rashes or lesions noted      Medications:  Current Facility-Administered Medications   Medication Dose Route Frequency    apixaban  5 mg Oral Q12H SCH    budesonide-formoterol  2 puff Inhalation BID    ciprofloxacin  500 mg Oral Q12H SCH    diazePAM  2 mg Oral Q8H SCH    heparin (porcine)  5,000 Units Intracatheter Once    heparin (porcine)  5,000 Units Intracatheter Once    heparin (porcine)  5,000 Units Intracatheter Once    heparin (porcine)  5,000 Units Intracatheter Once    lurasidone  120 mg Oral QHS    metoprolol succinate XL  37.5 mg Oral Q12H    midodrine  10 mg Oral BID Meals    pantoprazole  40 mg Oral QAM    polyethylene glycol  17 g  Oral Daily    rOPINIRole  0.5 mg Oral QHS    senna-docusate  2 tablet Oral QHS    tamsulosin  0.4 mg Oral Daily    thiamine  100 mg Oral Daily    vitamin B-6  50 mg Oral Daily    vitamin D  25 mcg Oral Daily    vitamins/minerals  1 tablet Oral Daily    zinc sulfate  220 mg Oral Daily         Labs:     Results       Procedure Component Value Units Date/Time    CBC and differential [[id number]]  (Abnormal) Collected: 02/06/22 1003    Specimen: Blood Updated: 02/06/22 1023     WBC 11.12 x10 3/uL      Hgb 7.7 g/dL      Hematocrit 40.9 %      Platelets 327 x10 3/uL      RBC 2.92 x10 6/uL      MCV 81.8 fL      MCH 26.4 pg      MCHC 32.2 g/dL      RDW 18 %      MPV 9.1 fL      Instrument Absolute Neutrophil Count 7.91 x10 3/uL      Neutrophils 71.1 %      Lymphocytes Automated 21.1 %      Monocytes 4.9 %      Eosinophils Automated 2.1 %      Basophils Automated 0.4 %      Immature Granulocytes 0.4 %      Nucleated RBC 0.0 /100 WBC      Neutrophils Absolute 7.91 x10 3/uL      Lymphocytes Absolute Automated 2.35 x10 3/uL      Monocytes Absolute Automated 0.54 x10 3/uL      Eosinophils Absolute Automated 0.23 x10 3/uL      Basophils Absolute Automated 0.04 x10 3/uL      Immature Granulocytes Absolute 0.05 x10 3/uL      Absolute NRBC 0.00 x10 3/uL     Basic Metabolic Panel [[id number]] Collected: 02/06/22 1003    Specimen: Blood Updated: 02/06/22 1013                  @A1c @    Radiology:     Radiology Results (24 Hour)       Procedure Component Value Units Date/Time    US Venous Duplex Doppler Leg Bilateral [[id number]] Collected: 02/06/22 0157    Order Status: Completed Updated: 02/06/22 0159    Narrative:      HISTORY: Edema    COMPARISON: None available.    TECHNIQUE: Wallace Cullens scale, color flow, and spectral Doppler waveform  analysis was performed on the BILATERAL lower extremity veins described  below. There is normal compressibility, phasic flow, and response to  augmentation unless otherwise noted.    FINDINGS:     RIGHT LOWER  EXTREMITY:  Common femoral vein: Normal  Deep femoral vein (proximal portion): Normal  Greater saphenous vein at the saphenofemoral junction: Normal    Femoral vein (proximal): Normal  Femoral vein (mid): Normal  Femoral vein (distal): Normal    Popliteal vein: Normal    Peroneal veins: Normal  Posterior tibial veins: Normal    Soft tissues: No Baker's cyst identified.      LEFT LOWER EXTREMITY:  Common femoral vein: Normal  Deep femoral vein (proximal portion): Normal  Greater saphenous vein at the saphenofemoral junction: Normal    Femoral vein (proximal): Normal  Femoral vein (mid): Normal  Femoral vein (distal): Normal    Popliteal vein: Normal    Peroneal veins: Normal  Posterior tibial veins: Normal    Soft tissues: No Baker's cyst identified.        Impression:         No deep venous thrombosis of the BILATERAL lower extremities.    Johnsie KindredFaris Haddad, MD  02/06/2022 1:57 AM            Lines  Patient Lines/Drains/Airways Status       Active PICC Line / CVC Line / PIV Line / Drain / Airway / Intraosseous Line / Epidural Line / ART Line / Line / Wound / Pressure Ulcer / NG/OG Tube       Name Placement date Placement time Site Days    Urethral Catheter Non-latex;Straight-tip 16 Fr. 01/23/22  1457  Non-latex;Straight-tip  3    Wound 01/03/22 Stage 1 Coccyx;Sacrum unblanchable redness 01/03/22  2200  Coccyx;Sacrum  23                  Urethral Catheter Non-latex;Straight-tip 16 Fr. (Active)   Catheter necessity reviewed? Yes 01/27/22 1100   Site Assessment WDL 01/27/22 0800   Pericare (With Urinary Catheter) Yes 01/27/22 1100   Collection Container Standard drainage bag with urimeter 01/27/22 1100   Securement Method Securement device 01/27/22 1100   Reason for Continuing Urinary Catheterization past POD 1 Acute urinary retention due to nerve injury 01/27/22 0800   Positioned catheter tubing for unobstructed urine flow: Yes 01/27/22 1100   Urine Output (mL) 300 mL 01/26/22 1500   Urine Output (mL) Total 300 mL 01/26/22  1500   Number of days: 3         Assessment:     Patient Active Problem List   Diagnosis    CVA (cerebral vascular accident)    Chest pain    Chest pain with high risk of acute coronary syndrome    Paroxysmal atrial fibrillation    Hypertension    Hyperlipidemia    Seizure disorder    History of gastrointestinal hemorrhage    Depression    Anemia    Asthma    Non-traumatic subcutaneous emphysema    Chest pain with moderate risk for cardiac etiology    Left-sided weakness    History of asthma    History of transient ischemic attack (TIA)    Type 2 diabetes mellitus    Syncope and collapse    Weakness of both legs    Hypokalemia    Thoracic back pain    Gastroparesis    Guillain Barr syndrome    Myositis    Stiff person syndrome  66 y.o. female hx afib on Eliquis, HTN, HLD, DM, COPD, TIAs, bipolar disorder, [facility] admit 2/7-2/17/23 for rhabdomyolysis (peak CK 4464 - statin d/c'ed) + vomiting ?gastroparesis + starvation ketoacidosis + UTI, [facility] admit 2/18-2/20/23 for rhabdomyolysis (peak CK 2509) + transaminitis + bilateral leg weakness + right paresthesias, admit 3/4-3/13 for bilateral hand/leg weakness and paresthesias  (unclear etiology, suspect myositis s/p IVIG but had facial paresthesias so it was stopped) + leg muscle edema on MRI (no biopsy done) + non-ischemic cardiomyopathy (EF 43%) who presents with ongoing paraparesis with +GAD65, suspecting stiff person syndrome.   Plan:   Stiff Person Syndrome  - in setting of +GAD65 ab. Hand weakness appears improved but paraparesis persists.  -Neurology following  -s/p [place] Catheter.  S/p PLEX 4 sessions. Last session (5th) on Monday.  Repeat brain MRI unremarkable.   -Benzodiazepine trial. Cont Valium 5mg  q8h.   -Routine Cancer screening as an outpatient.  -Neuro checks.   -PT/OT.     Anemia of chronic disease  -Iron profile reviewed, B12 within normal limits  -Hemoglobin remains stable on Eliquis    UTI  - UA showed ecoli. Due to urinary retention, cw  Ciprofloxacin to decrease chance of complicated UTI, pyelonephritis.     Maintain Foley catheter for retention (4/13).   -Failed multiple voiding trials     Hx afib on Eliquis,   -Metop for rate control; increase to 37.5mg  XL BID  -Cont Eliquis    HTN, HLD, DM (A1c 5.3 on 12/2021), COPD, TIAs, COPD, bipolar disorder, [facility] admit 2/7-2/17/23 for rhabdomyolysis (peak CK 4464 - statin d/c'ed) + vomiting ?gastroparesis + starvation ketoacidosis + UTI, [facility] admit 2/18-2/20/23 for rhabdomyolysis (peak CK 2509) + transaminitis + bilateral leg weakness + right paresthesias, admit 3/4-3/13 for bilateral hand/leg weakness and paresthesias  (unclear etiology, suspect myositis s/p IVIG but had facial paresthesias so it was stopped) + leg muscle edema on MRI (no biopsy done) + non-ischemic cardiomyopathy (EF 43%)      DVT/GI prophylaxis - SCDs. Eliquis BID                       Recent Labs     02/05/22  0251 02/04/22  0309   Potassium 3.1* 3.5     -Replete per electrolyte protocol  Diagnosis: Hypokalemia        Signed by: 02/06/22, DO, FACP  cc:Durene Romans, MD

## 2022-02-06 NOTE — Plan of Care (Signed)
NURSING PROGRESS NOTE: STROKE UNIT    Patient Name: [name] (66 y.o. female)  Admission Date: 01/26/2022 [facility] Day 11)      Recent Labs   Lab 02/06/22  1003   Sodium 137   Potassium 3.7   Chloride 107   CO2 18   BUN 5.0*   Creatinine 0.5   EGFR >60.0   Glucose 88   Calcium 8.5       Recent Labs   Lab 02/06/22  1003   WBC 11.12*   Hgb 7.7*   Hematocrit 23.9*   Platelets 327         Patient Lines/Drains/Airways Status       Active Lines, Drains and Airways       Name Placement date Placement time Site Days    Temporary Catheter - Non-Tunneled 01/27/22 Internal Jugular Right 01/27/22  1525  -- 9    Midline IV 01/27/22 Anterior;Left Upper Arm 01/27/22  1738  Upper Arm  9    Urethral Catheter Non-latex 16 Fr. 02/03/22  1200  Non-latex  3                       Braden Scale Score: 15 (02/06/22 0800)      Skin Integrity: Scars, Blanchable Redness  Bruising Skin Location: scattered    Wound 01/03/22 Stage 1 Coccyx;Sacrum unblanchable redness (Active)   Date First Assessed/Time First Assessed: 01/03/22 2200   Pressure Injury Staging (WOCN/ Trained RNs Only): Stage 1  Location: Coccyx;Sacrum  Wound Description (Comments): unblanchable redness  Present on Admission: Yes      Assessments 01/03/2022 11:00 PM 02/04/2022 10:00 PM   Site Description Clean;Dry;Intact Intact   Peri-wound Description Clean;Dry --   Closure Open to air --   Treatments Site care --   Dressing Foam --   Dressing Changed New --   Dressing Status Clean;Dry;Intact --       No associated orders.       Puncture Site 01/27/22 Venous Anterior;Right Neck (Active)   Date First Assessed/Time First Assessed: 01/27/22 1518   Puncture site location: Venous  Wound Location Orientation: Anterior;Right  Location: Neck      Assessments 01/27/2022  3:28 PM 02/06/2022  8:00 AM   Site Assessment Clean;Dry;Intact;Soft;Non Tender Clean;Dry;Intact   Dressing Status Dry;Intact Clean;Dry;Intact   Dressing Changed New --   Drainage Amount None --       No associated orders.        Safety Checklist   1:1 Sitter N    Avasys N       Interpreter Services:  Does the patient require an Interpreter? N    If yes, what form of interpreter services was used? N/A     If family was utilized, is interpreter waiver form signed and in the chart? N/A      ASSESSMENT/PLAN:    Last BM: 4/15    Pending Orders: PLEX 5/5 on 4/17    Discharge Plan: SNF    POC / Family Update: Pt w/ RN    Shift Note: A/Ox4. Speech clear and slightly delayed. Follows commands. MAE: moderate and can overcome resistance, intermittent tingling in arms and feet. Stand-pivot to chair w/ walker and x1 assist. RA, lung sounds clear to diminished. No tele. Continent to BM, foley in place. Foley care completed this shift. Tolerating regular diet, takes pills whole with thins. POOR APPETITE.  C/o itchiness, requesting IV benadryl, x2 given.    Fall precautions in place:  bed in lowest position, call light within reach, fall mat in place, bed alarm on.  PPE worn in patient's room: surgical mask, gloves     Problem: Compromised Tissue integrity  Goal: Damaged tissue is healing and protected  Outcome: Progressing  Flowsheets (Taken 02/06/2022 1010)  Damaged tissue is healing and protected:   Monitor/assess Braden scale every shift   Reposition patient every 2 hours and as needed unless able to reposition self   Provide wound care per wound care algorithm   Relieve pressure to bony prominences for patients at moderate and high risk   Increase activity as tolerated/progressive mobility   Keep intact skin clean and dry   Use bath wipes, not soap and water, for daily bathing   Use incontinence wipes for cleaning urine, stool and caustic drainage. Foley care as needed   Avoid shearing injuries   Monitor external devices/tubes for correct placement to prevent pressure, friction and shearing   Encourage use of lotion/moisturizer on skin   Monitor patient's hygiene practices     Problem: Neurological Deficit  Goal: Neurological status is stable or  improving  Outcome: Progressing  Flowsheets (Taken 02/06/2022 1010)  Neurological status is stable or improving:   Monitor/assess/document neurological assessment (Stroke: every 4 hours)   Monitor/assess NIH Stroke Scale   Observe for seizure activity and initiate seizure precautions if indicated   Re-assess NIH Stroke Scale for any change in status   Perform CAM Assessment     Problem: Impaired Mobility  Goal: Mobility/Activity is maintained at optimal level for patient  Outcome: Progressing  Flowsheets (Taken 02/06/2022 1010)  Mobility/activity is maintained at optimal level for patient:   Increase mobility as tolerated/progressive mobility   Encourage independent activity per ability   Maintain proper body alignment   Perform active/passive ROM   Assess for changes in respiratory status, level of consciousness and/or development of fatigue   Consult/collaborate with Physical Therapy and/or Occupational Therapy   Reposition patient every 2 hours and as needed unless able to reposition self   Plan activities to conserve energy, plan rest periods     Problem: Anxiety  Goal: Anxiety is at a manageable level  Outcome: Progressing  Flowsheets (Taken 02/06/2022 1010)  Anxiety is at a manageable level:   Orient to unit   Inform/explain to patient/patient care companion all tests/procedures/treatment/care prior to initiation   Assess emotional status and coping mechanisms   Facilitate expression of feelings, fears, concerns, anxiety   Provide alternatives to reduce anxiety   Provide and maintain a safe environment   Consult/collaborate with ancillary departments   Include patient/patient care companion in decisions related to anxiety/depression   Limit or eliminate stimulants such as caffeine and nicotine   Provide emotional support   Assist in developing anxiety-reducing skills   Encourage participation in care

## 2022-02-06 NOTE — Progress Notes (Signed)
[facility], [employer].  Progress Note    Assessment:    -Hypokalemia likely due to poor oral intake   -Non gap metabolic acidosis               ? RTA  -? Stiff person syndrome               GAD65 antibody +              On TPE  -HTN   -DM II   -HLD   -COPD    Plan:    -LR at 75 cc/hr   -TPE per neurology   -labs   -supportive care   -encouraged her to eat better     Burnis Medin, MD  Epic Secure Chat: direct or group "[facility]  [place] [id number]  Office - [phone]  ++++++++++++++++++++++++++++++++++++++++++++++++++++++++++++++  Chief Complaint:    Interval History:    Review of Systems:    Medications:  Scheduled Meds:  Current Facility-Administered Medications   Medication Dose Route Frequency    apixaban  5 mg Oral Q12H SCH    budesonide-formoterol  2 puff Inhalation BID    ciprofloxacin  500 mg Oral Q12H SCH    diazePAM  2 mg Oral Q8H SCH    heparin (porcine)  5,000 Units Intracatheter Once    heparin (porcine)  5,000 Units Intracatheter Once    heparin (porcine)  5,000 Units Intracatheter Once    heparin (porcine)  5,000 Units Intracatheter Once    lurasidone  120 mg Oral QHS    metoprolol succinate XL  37.5 mg Oral Q12H    midodrine  10 mg Oral BID Meals    pantoprazole  40 mg Oral QAM    polyethylene glycol  17 g Oral Daily    rOPINIRole  0.5 mg Oral QHS    senna-docusate  2 tablet Oral QHS    tamsulosin  0.4 mg Oral Daily    thiamine  100 mg Oral Daily    vitamin B-6  50 mg Oral Daily    vitamin D  25 mcg Oral Daily    vitamins/minerals  1 tablet Oral Daily    zinc sulfate  220 mg Oral Daily     Continuous Infusions:   citrate dextrose Stopped (01/28/22 1608)    citrate dextrose Stopped (01/31/22 1031)    citrate dextrose Stopped (02/02/22 1544)    citrate dextrose Stopped (02/04/22 1807)     PRN Meds:acetaminophen **OR** acetaminophen, albuterol sulfate HFA, alteplase, alteplase, alteplase, alteplase, alteplase, alteplase, calcium carbonate,  calcium carbonate, calcium carbonate, calcium carbonate, diphenhydrAMINE **OR** diphenhydrAMINE, diphenhydrAMINE, diphenhydrAMINE, diphenhydrAMINE, hydrALAZINE, HYDROmorphone, labetalol, magnesium sulfate, melatonin, naloxone, ondansetron **OR** ondansetron, potassium chloride **OR** potassium chloride    Objective:  Vital signs in last 24 hours:  Temp:  [97.7 F (36.5 C)-98.4 F (36.9 C)] 97.7 F (36.5 C)  Heart Rate:  [88-115] 108  Resp Rate:  [18] 18  BP: (105-144)/(71-74) 109/74  Intake/Output last 24 hours:    Intake/Output Summary (Last 24 hours) at 02/06/2022 1058  Last data filed at 02/06/2022 0800  Gross per 24 hour   Intake --   Output 800 ml   Net -800 ml     Intake/Output this shift:  I/O this shift:  In: -   Out: 600 [Urine:600]    Physical Exam:   Gen: Well developed, no acute distress   HEENT: Atraumatic, moist mucous membranes   Neck: Trachea midline, supple,  no JVD   CV: S1 S2 N RRR, no edema   Chest: Good effort, CTAB   Ab: ND NT soft no HSM +BS   Skin: Dry, intact   Psych: Alert and oriented to person, place, time, appropriate affect   Dialysis Access:    Labs:  Recent Labs   Lab 02/06/22  1003 02/05/22  0251 02/04/22  0309 02/02/22  0416 02/01/22  0446   Glucose 88 75 83  More results in Results Review 102*   BUN 5.0* 7.0 6.0*  More results in Results Review 5.0*   Creatinine 0.5 0.5 0.5  More results in Results Review 0.5   Calcium 8.5 8.4* 9.0  More results in Results Review 8.5   Sodium 137 139 139  More results in Results Review 140   Potassium 3.7 3.1* 3.5  More results in Results Review 3.2*   Chloride 107 112* 108  More results in Results Review 112*   CO2 18 16* 21  More results in Results Review 22   Magnesium  --   --   --   --  1.7   More results in Results Review = values in this interval not displayed.     Recent Labs   Lab 02/06/22  1003 02/05/22  0251 02/04/22  0309   WBC 11.12* 11.38* 10.15*   Hgb 7.7* 8.1* 8.0*   Hematocrit 23.9* 25.3* 25.0*   MCV 81.8 81.9 81.2   MCH 26.4 26.2  26.0   MCHC 32.2 32.0 32.0   RDW 18* 18* 19*   MPV 9.1 9.2 9.1   Platelets [phone]

## 2022-02-06 NOTE — Plan of Care (Signed)
Problem: Moderate/High Fall Risk Score >5  Goal: Patient will remain free of falls  Outcome: Progressing  Flowsheets (Taken 02/04/2022 1931)  Moderate Risk (6-13):   MOD-Apply bed exit alarm if patient is confused   MOD-Consider activation of bed alarm if appropriate   MOD-Floor mat at bedside (where available) if appropriate   MOD-Remain with patient during toileting   MOD-Place bedside commode and assistive devices out of sight when not in use   MOD-Consider a move closer to Nurses Station   MOD-Re-orient confused patients   MOD-Utilize diversion activities   MOD-Perform dangle, stand, walk (DSW) prior to mobilization   MOD-include family in multidisciplinary POC discussions   MOD-Use gait belt when appropriate   MOD-Request PT/OT consult order for patients with gait/mobility impairment     Problem: Neurological Deficit  Goal: Neurological status is stable or improving  Outcome: Progressing  Flowsheets (Taken 02/05/2022 0718 by Christiana Pellant, RN)  Neurological status is stable or improving:   Monitor/assess/document neurological assessment (Stroke: every 4 hours)   Monitor/assess NIH Stroke Scale   Re-assess NIH Stroke Scale for any change in status   Observe for seizure activity and initiate seizure precautions if indicated   Perform CAM Assessment     Problem: Impaired Mobility  Goal: Mobility/Activity is maintained at optimal level for patient  Outcome: Progressing  Flowsheets (Taken 02/05/2022 0718 by Christiana Pellant, RN)  Mobility/activity is maintained at optimal level for patient:   Increase mobility as tolerated/progressive mobility   Encourage independent activity per ability   Maintain proper body alignment   Perform active/passive ROM   Plan activities to conserve energy, plan rest periods   Reposition patient every 2 hours and as needed unless able to reposition self   Assess for changes in respiratory status, level of consciousness and/or development of fatigue   Consult/collaborate with Physical Therapy  and/or Occupational Therapy  NURSING PROGRESS NOTE: STROKE UNIT    Patient Name: [name] (66 y.o. female)  Admission Date: 01/26/2022 [facility] Day 11)      Recent Labs   Lab 02/05/22  0251   Sodium 139   Potassium 3.1*   Chloride 112*   CO2 16*   BUN 7.0   Creatinine 0.5   EGFR >60.0   Glucose 75   Calcium 8.4*       Recent Labs   Lab 02/05/22  0251   WBC 11.38*   Hgb 8.1*   Hematocrit 25.3*   Platelets 295         Patient Lines/Drains/Airways Status       Active Lines, Drains and Airways       Name Placement date Placement time Site Days    Temporary Catheter - Non-Tunneled 01/27/22 Internal Jugular Right 01/27/22  1525  -- 9    Midline IV 01/27/22 Anterior;Left Upper Arm 01/27/22  1738  Upper Arm  9    Urethral Catheter Non-latex 16 Fr. 02/03/22  1200  Non-latex  2                       Braden Scale Score: 18 (02/05/22 2000)      Skin Integrity: Scars, Blanchable Redness  Bruising Skin Location: scattered    Wound 01/03/22 Stage 1 Coccyx;Sacrum unblanchable redness (Active)   Date First Assessed/Time First Assessed: 01/03/22 2200   Pressure Injury Staging (WOCN/ Trained RNs Only): Stage 1  Location: Coccyx;Sacrum  Wound Description (Comments): unblanchable redness  Present on Admission: Yes  Assessments 01/03/2022 11:00 PM 02/04/2022 10:00 PM   Site Description Clean;Dry;Intact Intact   Peri-wound Description Clean;Dry --   Closure Open to air --   Treatments Site care --   Dressing Foam --   Dressing Changed New --   Dressing Status Clean;Dry;Intact --       No associated orders.       Puncture Site 01/27/22 Venous Anterior;Right Neck (Active)   Date First Assessed/Time First Assessed: 01/27/22 1518   Puncture site location: Venous  Wound Location Orientation: Anterior;Right  Location: Neck      Assessments 01/27/2022  3:28 PM 02/05/2022  8:00 AM   Site Assessment Clean;Dry;Intact;Soft;Non Tender Clean;Dry;Intact   Dressing Status Dry;Intact Clean;Dry;Intact   Dressing Changed New --   Drainage Amount None --        No associated orders.       Safety Checklist   1:1 Sitter N    Avasys N       Interpreter Services:  Does the patient require an Interpreter? N    If yes, what form of interpreter services was used? N     If family was utilized, is Tour manager form signed and in the chart? N      ASSESSMENT/PLAN:    Last BM: 04/13    Pending Orders: apheresis, BLE [place]    Discharge Plan: snf    POC / Family Update: patient    Shift Note: Pt drowsy. Oriented x 4 / Follows commands, clear speech. MAE w/ generalized weakness. Pt endorses intermittent numbness and tingling in all extremities and lips. Able to ambulate 1 assist with walker. PRN benadryl and Zofran given. Tolerating diet,pills whole. Foley in place.    PPE worn in patient's room: Surgical mask, gloves

## 2022-02-07 ENCOUNTER — Inpatient Hospital Stay: Payer: 59

## 2022-02-07 LAB — CALCIUM IONIZED - POST FILTER: Calcium Ionized - Post Filter: 2.43 mEq/L

## 2022-02-07 LAB — FIBRINOGEN: Fibrinogen: 252 mg/dL (ref 181–413)

## 2022-02-07 MED ORDER — ACD FORMULA A 0.73-2.45-2.2 GM/100ML VI SOLN (APHERESIS)
300.0000 mL | Status: DC
Start: 2022-02-07 — End: 2022-02-08
  Administered 2022-02-07: 288 mL via INTRAVENOUS

## 2022-02-07 MED ORDER — DIPHENHYDRAMINE HCL 25 MG PO CAPS
25.0000 mg | ORAL_CAPSULE | Freq: Four times a day (QID) | ORAL | Status: DC | PRN
Start: 2022-02-07 — End: 2024-06-14

## 2022-02-07 MED ORDER — DIAZEPAM 2 MG PO TABS
2.0000 mg | ORAL_TABLET | Freq: Three times a day (TID) | ORAL | 0 refills | Status: DC
Start: 2022-02-07 — End: 2023-11-23

## 2022-02-07 MED ORDER — ALBUMIN HUMAN/BIOSIMILIAR 5% IV SOLN (WRAP)
25.0000 g | INTRAVENOUS | Status: AC
Start: 2022-02-07 — End: 2022-02-07
  Administered 2022-02-07: 125 g via INTRAVENOUS
  Filled 2022-02-07: qty 2500

## 2022-02-07 MED ORDER — HYDROMORPHONE HCL 2 MG PO TABS
2.0000 mg | ORAL_TABLET | Freq: Four times a day (QID) | ORAL | 0 refills | Status: DC | PRN
Start: 2022-02-07 — End: 2023-11-23

## 2022-02-07 MED ORDER — CALCIUM CARBONATE ANTACID 500 MG PO CHEW
1000.0000 mg | CHEWABLE_TABLET | Freq: Four times a day (QID) | ORAL | Status: DC | PRN
Start: 2022-02-07 — End: 2022-02-08

## 2022-02-07 MED ORDER — CALCIUM GLUCONATE-NACL 1-0.675 GM/50ML-% IV SOLN
1.0000 g | INTRAVENOUS | Status: AC
Start: 2022-02-07 — End: 2022-02-07
  Administered 2022-02-07 (×2): 1 g via INTRAVENOUS
  Filled 2022-02-07 (×2): qty 50

## 2022-02-07 MED ORDER — NALOXONE HCL 4 MG/0.1ML NA LIQD
NASAL | 0 refills | Status: AC
Start: 2022-02-07 — End: ?

## 2022-02-07 MED ORDER — METOPROLOL SUCCINATE ER 25 MG PO TB24
37.5000 mg | ORAL_TABLET | Freq: Two times a day (BID) | ORAL | Status: AC
Start: 2022-02-07 — End: ?

## 2022-02-07 MED ORDER — ALTEPLASE 2 MG IJ SOLR
2.0000 mg | Freq: Once | INTRAMUSCULAR | Status: AC
Start: 2022-02-07 — End: 2022-02-07
  Administered 2022-02-07: 2 mg
  Filled 2022-02-07: qty 2

## 2022-02-07 NOTE — Plan of Care (Signed)
NURSING PROGRESS NOTE: STROKE UNIT    Patient Name: [name] (66 y.o. female)  Admission Date: 01/26/2022 [facility] Day 12)      Recent Labs   Lab 02/06/22  1003   Sodium 137   Potassium 3.7   Chloride 107   CO2 18   BUN 5.0*   Creatinine 0.5   EGFR >60.0   Glucose 88   Calcium 8.5       Recent Labs   Lab 02/06/22  1003   WBC 11.12*   Hgb 7.7*   Hematocrit 23.9*   Platelets 327         Patient Lines/Drains/Airways Status       Active Lines, Drains and Airways       Name Placement date Placement time Site Days    Temporary Catheter - Non-Tunneled 01/27/22 Internal Jugular Right 01/27/22  1525  -- 10    Midline IV 01/27/22 Anterior;Left Upper Arm 01/27/22  1738  Upper Arm  10    Urethral Catheter Non-latex 16 Fr. 02/03/22  1200  Non-latex  4                       Braden Scale Score: 15 (02/07/22 0800)      Skin Integrity: Blanchable Redness, Scars  Bruising Skin Location: scattered    Wound 01/03/22 Stage 1 Coccyx;Sacrum unblanchable redness (Active)   Date First Assessed/Time First Assessed: 01/03/22 2200   Pressure Injury Staging (WOCN/ Trained RNs Only): Stage 1  Location: Coccyx;Sacrum  Wound Description (Comments): unblanchable redness  Present on Admission: Yes      Assessments 01/03/2022 11:00 PM 02/04/2022 10:00 PM   Site Description Clean;Dry;Intact Intact   Peri-wound Description Clean;Dry --   Closure Open to air --   Treatments Site care --   Dressing Foam --   Dressing Changed New --   Dressing Status Clean;Dry;Intact --       No associated orders.       Puncture Site 01/27/22 Venous Anterior;Right Neck (Active)   Date First Assessed/Time First Assessed: 01/27/22 1518   Puncture site location: Venous  Wound Location Orientation: Anterior;Right  Location: Neck      Assessments 01/27/2022  3:28 PM 02/06/2022  8:00 PM   Site Assessment Clean;Dry;Intact;Soft;Non Tender Clean;Dry;Intact   Dressing Status Dry;Intact Clean;Dry;Intact   Dressing Changed New --   Drainage Amount None --       No associated orders.        Safety Checklist   1:1 Sitter N    Avasys N       Interpreter Services:  Does the patient require an Interpreter? N    If yes, what form of interpreter services was used? N/A     If family was utilized, is interpreter waiver form signed and in the chart? N/A      ASSESSMENT/PLAN:    Last BM: 4/15    Pending Orders: Discharge    Discharge Plan: SNF    POC / Family Update: Pt w/ RN    Shift Note: A/Ox4. Speech clear and slightly delayed. Follows commands. MAE: moderate and can overcome resistance, intermittent tingling in arms and feet. Stand-pivot to chair w/ walker and x1 assist. RA, lung sounds clear to diminished. No tele. Continent to BM, foley in place. Foley care completed this shift. Tolerating regular diet, takes pills whole with thins. POOR APPETITE.  C/o itchiness, requesting IV benadryl, x2 given.    PLEX 5/5 completed this shift.  Fall precautions in place: bed in lowest position, call light within reach, fall mat in place, bed alarm on.  PPE worn in patient's room: surgical mask, gloves     Problem: Compromised Tissue integrity  Goal: Damaged tissue is healing and protected  Outcome: Progressing  Flowsheets (Taken 02/07/2022 0728)  Damaged tissue is healing and protected:   Monitor/assess Braden scale every shift   Provide wound care per wound care algorithm   Reposition patient every 2 hours and as needed unless able to reposition self   Increase activity as tolerated/progressive mobility   Keep intact skin clean and dry   Avoid shearing injuries   Relieve pressure to bony prominences for patients at moderate and high risk   Use bath wipes, not soap and water, for daily bathing   Use incontinence wipes for cleaning urine, stool and caustic drainage. Foley care as needed   Monitor external devices/tubes for correct placement to prevent pressure, friction and shearing   Monitor patient's hygiene practices   Encourage use of lotion/moisturizer on skin     Problem: Neurological Deficit  Goal:  Neurological status is stable or improving  Outcome: Progressing  Flowsheets (Taken 02/07/2022 0728)  Neurological status is stable or improving:   Monitor/assess/document neurological assessment (Stroke: every 4 hours)   Re-assess NIH Stroke Scale for any change in status   Monitor/assess NIH Stroke Scale   Observe for seizure activity and initiate seizure precautions if indicated   Perform CAM Assessment     Problem: Impaired Mobility  Goal: Mobility/Activity is maintained at optimal level for patient  Outcome: Progressing  Flowsheets (Taken 02/07/2022 0728)  Mobility/activity is maintained at optimal level for patient:   Increase mobility as tolerated/progressive mobility   Encourage independent activity per ability   Maintain proper body alignment   Perform active/passive ROM   Plan activities to conserve energy, plan rest periods   Reposition patient every 2 hours and as needed unless able to reposition self   Assess for changes in respiratory status, level of consciousness and/or development of fatigue   Consult/collaborate with Physical Therapy and/or Occupational Therapy     Problem: Anxiety  Goal: Anxiety is at a manageable level  Outcome: Progressing  Flowsheets (Taken 02/07/2022 0728)  Anxiety is at a manageable level:   Orient to unit   Inform/explain to patient/patient care companion all tests/procedures/treatment/care prior to initiation   Assess emotional status and coping mechanisms   Facilitate expression of feelings, fears, concerns, anxiety   Provide alternatives to reduce anxiety   Provide and maintain a safe environment   Limit or eliminate stimulants such as caffeine and nicotine   Provide emotional support   Encourage participation in care   Assist in developing anxiety-reducing skills   Include patient/patient care companion in decisions related to anxiety/depression   Consult/collaborate with ancillary departments

## 2022-02-07 NOTE — OT Progress Note (Signed)
Occupational Therapy Treatment  [name]        Post Acute Care Therapy Recommendations:     Discharge Recommendations:  SNF    If SNF  recommended discharge disposition is not available, patient will need hands on assist for ADLs, functional mobility, transfers, and HHOT.     DME needs IF patient is discharging home: BSC, Wheelchair-manual, Front wheel walker    Therapy discharge recommendations may change with patient status.  Please refer to most recent note for up-to-date recommendations.    Assessment:   Significant Findings: None    RN approved pt for therapy and pt semi-supine in bed upon OT arrival. Pt completed bed mobility and donned pants as detailed below under functional measures. Pt ambulated short household distances with the RW x 2 with CGA-Min A for steadying and min cues for self-monitoring. While standing pt engaged in a simulated pants management task, targeting grip strength and dynamic balance to promote increased pt safety and independence with standing ADL tasks. Pt required min A for steadying and mod cues for self-monitoring, initiation of rest breaks, and safety awareness. Pt reported lightheadedness- vitals stable. Pt returned to semi-supine in bed and lightheadedness dissipated. Pt continues to demonstrate decreased strength, decreased balance, decreased endurance, decreased activity tolerance, and decreased coordination, which impacts pt's overall safety and independence with functional t/f, ADLs, and IADLs. Pt is functioning below PLOF and will benefit from continued acute OT services to maximize independence and safety with ADLs and functional transfers/mobility.      Treatment Activities: ADL retraining, functional t/f training, endurance, pt education, dynamic balance     Educated the patient to role of occupational therapy, plan of care, goals of therapy and safety with mobility and ADLs, home safety.    Plan:    OT Frequency Recommended: 3-4x/wk     Continue plan of  care.    [facility]  Bed: F613/F613.01       Precautions and Contraindications:   Fall   Bleeding  Monitor HR    Updated Medical Status/Imaging/Labs:  Reviewed     Subjective: "It was boring." -Pt in reference to the weekend    Patient's medical condition is appropriate for Occupational Therapy intervention at this time.  Patient is agreeable to participation in the therapy session. Nursing clears patient for therapy.    Pain:   Scale: No pain reported      Objective:   Patient is in bed with temporary catheter, midline, foley catheter, and peripheral IV in place.  Pt wore mask during therapy session:No      Cognition  Pt alert and oriented x 4. Pt able to follow multi-step commands. Note decreased self-monitoring during tasks, as pt requires cueing to implement rest breaks for decreased fall risk.     Functional Mobility  Rolling:  SPV  Supine to Sit: SPV  Sit to Stand: CGA-Min A  Transfers: CGA-Min A    PMP Activity: Step 6 - Walks in Room    Balance  Static Sitting: Good  Static Standing: Good-     Self Care and Home Management  Eating: SPV  Grooming: Min A/Mod A standing at the sink  Bathing: Mod A, seated EOB  UE Dressing: Min A, seated EOB, assist for management over catheter at cervical region  LE Dressing: Mod A, seated EOB, assist for foley management and steadying during clothing management up  Toileting: Mod-Max A     Therapeutic Exercises  With activity  Participation: Good  Endurance: Fair+    Patient left with call bell within reach, all needs met, SCDs off as found, fall mat in mat, bed alarm on, chair alarm N/A and all questions answered. RN notified of session outcome and patient response.     Goals:  Time For Goal Achievement: 7 visits  ADL Goals  Patient will groom self: Risk analyst, at sinkside, 7 visits  Patient will dress lower body: Minimal Assist, 7 visits  Pt will complete bathing: Minimal Assist, 7 visits  Patient will toilet: Minimal Assist,  7 visits  Mobility and Transfer Goals  Pt will perform functional transfers: Contact Guard Assist, 7 visits (w/ LRD)  Neuro Re-Ed Goals  Pt will perform dynamic sitting balance: Supervision, to increase ability to complete ADLs, 7 visits           Vision Goals  Pt with diplopia will complete ADLs with adaptive equipment: Discontinued (comment)            PPE worn during session: procedural mask and gloves    Tech present: no  PPE worn by tech: N/A    Raliegh Ip, OTR/L   Pager #: [phone]     Time of Treatment  OT Received On: 02/07/22  Start Time: 0930  Stop Time: 1010  Time Calculation (min): 40 min    Treatment # 5 of 7 visits

## 2022-02-07 NOTE — Progress Notes (Signed)
[facility], [employer].  Progress Note    Assessment:    -Hypokalemia likely due to poor oral intake   -Non gap metabolic acidosis               ? RTA  -? Stiff person syndrome               GAD65 antibody +              On TPE  -HTN   -DM II   -HLD   -COPD     Plan:     -watch off fluids   -TPE per neurology   -labs   -supportive care   -encouraged her to eat better     Burnis Medin, MD  Epic Secure Chat: direct or group "[facility]  [place] [id number]  Office - [phone]  ++++++++++++++++++++++++++++++++++++++++++++++++++++++++++++++  Chief Complaint:    Interval History:    Review of Systems:    Medications:  Scheduled Meds:  Current Facility-Administered Medications   Medication Dose Route Frequency    apixaban  5 mg Oral Q12H SCH    budesonide-formoterol  2 puff Inhalation BID    diazePAM  2 mg Oral Q8H SCH    heparin (porcine)  5,000 Units Intracatheter Once    heparin (porcine)  5,000 Units Intracatheter Once    heparin (porcine)  5,000 Units Intracatheter Once    heparin (porcine)  5,000 Units Intracatheter Once    lurasidone  120 mg Oral QHS    metoprolol succinate XL  37.5 mg Oral Q12H    midodrine  10 mg Oral BID Meals    pantoprazole  40 mg Oral QAM    polyethylene glycol  17 g Oral Daily    rOPINIRole  0.5 mg Oral QHS    senna-docusate  2 tablet Oral QHS    tamsulosin  0.4 mg Oral Daily    thiamine  100 mg Oral Daily    vitamin B-6  50 mg Oral Daily    vitamin D  25 mcg Oral Daily    vitamins/minerals  1 tablet Oral Daily    zinc sulfate  220 mg Oral Daily     Continuous Infusions:   citrate dextrose Stopped (01/28/22 1608)    citrate dextrose Stopped (01/31/22 1031)    citrate dextrose Stopped (02/02/22 1544)    citrate dextrose Stopped (02/04/22 1807)     PRN Meds:acetaminophen **OR** acetaminophen, albuterol sulfate HFA, alteplase, alteplase, alteplase, alteplase, alteplase, alteplase, calcium carbonate, calcium carbonate, calcium carbonate,  calcium carbonate, diphenhydrAMINE **OR** diphenhydrAMINE, diphenhydrAMINE, diphenhydrAMINE, diphenhydrAMINE, hydrALAZINE, HYDROmorphone, labetalol, magnesium sulfate, melatonin, naloxone, ondansetron **OR** ondansetron, potassium chloride **OR** potassium chloride    Objective:  Vital signs in last 24 hours:  Temp:  [98 F (36.7 C)-98.6 F (37 C)] 98 F (36.7 C)  Heart Rate:  [71-112] 104  Resp Rate:  [17-18] 18  BP: (107-116)/(67-78) 114/70  Intake/Output last 24 hours:  No intake or output data in the 24 hours ending 02/07/22 1311  Intake/Output this shift:  No intake/output data recorded.    Physical Exam:   Gen: Well developed, no acute distress   HEENT: Atraumatic, moist mucous membranes   Neck: Trachea midline, supple, no JVD   CV: S1 S2 N RRR, no edema   Chest: Good effort, CTAB   Ab: ND NT soft no HSM +BS   Skin: Dry, intact   Psych: Alert and oriented to person, place, time, appropriate affect  Dialysis Access:    Labs:  Recent Labs   Lab 02/06/22  1003 02/05/22  0251 02/04/22  0309 02/02/22  0416 02/01/22  0446   Glucose 88 75 83  More results in Results Review 102*   BUN 5.0* 7.0 6.0*  More results in Results Review 5.0*   Creatinine 0.5 0.5 0.5  More results in Results Review 0.5   Calcium 8.5 8.4* 9.0  More results in Results Review 8.5   Sodium 137 139 139  More results in Results Review 140   Potassium 3.7 3.1* 3.5  More results in Results Review 3.2*   Chloride 107 112* 108  More results in Results Review 112*   CO2 18 16* 21  More results in Results Review 22   Magnesium  --   --   --   --  1.7   More results in Results Review = values in this interval not displayed.     Recent Labs   Lab 02/06/22  1003 02/05/22  0251 02/04/22  0309   WBC 11.12* 11.38* 10.15*   Hgb 7.7* 8.1* 8.0*   Hematocrit 23.9* 25.3* 25.0*   MCV 81.8 81.9 81.2   MCH 26.4 26.2 26.0   MCHC 32.2 32.0 32.0   RDW 18* 18* 19*   MPV 9.1 9.2 9.1   Platelets [phone]

## 2022-02-07 NOTE — Discharge Summary (Signed)
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]    Problems:   Lists the present on admission hospital problems  Present on Admission:   Stiff person syndrome      Problem Lists:  Patient Active Problem List   Diagnosis    CVA (cerebral vascular accident)    Chest pain    Chest pain with high risk of acute coronary syndrome    Paroxysmal atrial fibrillation    Hypertension    Hyperlipidemia    Seizure disorder    History of gastrointestinal hemorrhage    Depression    Anemia    Asthma    Non-traumatic subcutaneous emphysema    Chest pain with moderate risk for cardiac etiology    Left-sided weakness    History of asthma    History of transient ischemic attack (TIA)    Type 2 diabetes mellitus    Syncope and collapse    Weakness of both legs    Hypokalemia    Thoracic back pain    Gastroparesis    Guillain Barr syndrome    Myositis    Stiff person syndrome       Discharge Dx:   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      Johnsie Kindred, MD   02/06/2022 1:57 AM      MRI Brain W WO Contrast   Final Result         1. No acute intracranial finding.      2. Mild global volume loss and chronic small vessel ischemic changes.      3. Prominent cerebellar volume loss.      4. 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      Gaylyn Rong, MD   02/04/2022 7:54 AM      Non-Tunneled Cath Placement [facility])   Final Result      Right internal jugular approach non-tunneled ([place]) central venous   catheter placement.      PLAN:   Catheter is ready for use.         Verline Lema MD   01/28/2022 11:27 AM          Presenting history and hospital Course:   HPI per Admitting Provider  " [name] is a 66 y.o. female hx afib on Eliquis, HTN, HLD, DM, COPD, TIAs, COPD, bipolar disorder, [facility] admit 2/7-2/17/23 for rhabdomyolysis (peak CK 4464 - statin d/c'ed) + vomiting ?gastroparesis + starvation ketoacidosis + UTI, [facility] admit 2/18-2/20/23 for rhabdomyolysis (peak CK 2509) + transaminitis + bilateral leg weakness + right paresthesias, admit 3/4-3/13 for bilateral hand/leg weakness and paresthesias  (unclear etiology, suspect myositis s/p IVIG but had facial paresthesias so it was stopped) + leg  muscle edema on MRI (no biopsy done) + non-ischemic cardiomyopathy (EF 43%) who presents to the hospital with ongoing paraparesis. She was at [facility] rehab and her hand strength improved somewhat but she is still paraparetic. She was discharged to [facility] SNF today but his neurologist Dr. 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 These symptoms are sudden onset, severe intensity, without alleviating factors.  "     Hospital course  66 y.o. female hx afib on Eliquis, HTN, HLD, DM, COPD, TIAs, bipolar disorder, [facility] admit 2/7-2/17/23 for rhabdomyolysis (peak CK 4464 - statin d/c'ed) + vomiting ?gastroparesis + starvation ketoacidosis + UTI, [facility] admit 2/18-2/20/23 for rhabdomyolysis (peak CK 2509) + transaminitis + bilateral leg weakness + right paresthesias, admit 3/4-3/13 for bilateral hand/leg weakness and  paresthesias  (unclear etiology, suspect myositis s/p IVIG but had facial paresthesias so it was stopped) + leg muscle edema on MRI (no biopsy done) + non-ischemic cardiomyopathy (EF 43%) who presents with ongoing paraparesis with +GAD65, suspecting stiff person syndrome.     She underwent 5 sessions of PLEX. Her neuro exam and status improved significantly. She continues to stay on valium for stiffness.  She has 0 retention.  She had multiple voiding trials however she felt.  Her UA showed E. coli.  She was Completed ciprofloxacin to decrease chance of complicated UTI.    I had advised the patient the importance of following up closely with outpatient specialists and patient's PCP for continued management patient's disease status. Patient understood the instructions and appreciate the care that we have given. Pt is medically and neurologically stable to discharge to SNF.      Physical exam at discharge:  Vitals:    02/07/22 0726   BP: 107/67   Pulse: (!) 112   Resp: 18   Temp: 98 F (36.7 C)   SpO2: 100%     General: Awake, alert, oriented  HEENT: NC/AT.  MMM  Neck: supple  Cardiovascular: regular rate and rhythm, no murmurs, rubs or gallops  Lungs: clear to auscultation bilaterally, without wheezing, rhonchi, or rales  Abdomen: soft, non-tender, non-distended; normoactive bowel sounds, no rebound or guarding  Extremities: increased tone LLE>RLE.  (Overall LLE tone improved)  Neuro: Mild left facial droop, strength 5/5 in upper and 4/5 lower extremities, sensation intact except paraesthesias in hands/feet  Skin: no rashes or lesions noted       Discharge Medications:        Medication List        START taking these medications      diazePAM 2 MG tablet  Commonly known as: VALIUM  Take 1 tablet (2 mg) by mouth every 8 (eight) hours     naloxone 4 MG/0.1ML nasal spray  Commonly known as: NARCAN  1 spray intranasally. If pt does not respond or relapses into respiratory depression call 911. Give additional doses every  2-3 min.            CHANGE how you take these medications      metoprolol succinate XL 25 MG 24 hr tablet  Commonly known as: TOPROL-XL  Take 1.5 tablets (37.5 mg) by mouth every 12 (twelve) hours  What changed:   how much to take  when to take this  CONTINUE taking these medications      acetaminophen 325 MG tablet  Commonly known as: TYLENOL  Take 2 tablets (650 mg total) by mouth every 4 (four) hours as needed for Pain     albuterol sulfate HFA 108 (90 Base) MCG/ACT inhaler  Commonly known as: PROVENTIL  Inhale 2 puffs into the lungs every 6 (six) hours as needed for Wheezing     apixaban 5 MG  Commonly known as: ELIQUIS  Take 1 tablet (5 mg total) by mouth every 12 (twelve) hours     budesonide-formoterol 80-4.5 MCG/ACT inhaler  Commonly known as: SYMBICORT     calcium carbonate 500 MG chewable tablet  Commonly known as: TUMS  Chew 2 tablets (1,000 mg) by mouth every 6 (six) hours as needed for Heartburn     cholecalciferol 25 MCG (1000 UT) tablet  Commonly known as: vitamin D3  Take 1 tablet (25 mcg) by mouth daily     diphenhydrAMINE 25 mg capsule  Commonly known as: BENADRYL  Take 1 capsule (25 mg) by mouth every 6 (six) hours as needed for Itching     ferrous sulfate 324 (65 FE) MG Tbec  Take 1 tablet (324 mg) by mouth every other day     HYDROmorphone 2 MG tablet  Commonly known as: DILAUDID  Take 1 tablet (2 mg) by mouth every 6 (six) hours as needed (severe pain)     lurasidone 120 MG Tabs  Commonly known as: LATUDA  Take 1 tablet (120 mg) by mouth every evening     midodrine 10 MG tablet  Commonly known as: PROAMATINE  Take 1 tablet (10 mg) by mouth 2 (two) times daily with meals At 8am and 12pm     pantoprazole 40 MG tablet  Commonly known as: PROTONIX  Take 1 tablet (40 mg total) by mouth daily     polyethylene glycol 17 g packet  Commonly known as: MIRALAX  Take 17 g by mouth daily     pyridoxine 50 MG tablet  Commonly known as: B-6  Take 1 tablet (50 mg) by mouth daily     rOPINIRole 1 MG  tablet  Commonly known as: REQUIP  Take 1 tablet (1 mg total) by mouth nightly     senna-docusate 8.6-50 MG per tablet  Commonly known as: PERICOLACE  Take 2 tablets by mouth nightly     tamsulosin 0.4 MG Caps  Commonly known as: FLOMAX  Take 1 capsule (0.4 mg) by mouth Daily after dinner     thiamine 100 MG tablet  Commonly known as: B-1  Take 1 tablet (100 mg) by mouth daily     vitamins/minerals Tabs  Take 1 tablet by mouth daily     zinc sulfate 220 (50 Zn) MG capsule  Commonly known as: ZINCATE  Take 1 capsule (220 mg) by mouth daily            STOP taking these medications      LORazepam 0.5 MG tablet  Commonly known as: ATIVAN     nortriptyline 10 MG capsule  Commonly known as: PAMELOR     ondansetron 4 MG disintegrating tablet  Commonly known as: ZOFRAN-ODT     QUEtiapine 100 MG tablet  Commonly known as: SEROquel               Where to Get Your Medications        You can get these medications from any pharmacy    Bring a paper prescription  for each of these medications  diazePAM 2 MG tablet  HYDROmorphone 2 MG tablet       Information about where to get these medications is not yet available    Ask your nurse or doctor about these medications  diphenhydrAMINE 25 mg capsule  metoprolol succinate XL 25 MG 24 hr tablet  naloxone 4 MG/0.1ML nasal spray              Discharge Instructions:   Malcolm Metro, MD  [address]  [place] [address]  [phone]    Schedule an appointment as soon as possible for a visit in 1 week(s)  Medication review, Recent hospital stay review    [facility]  [address]  [place] [address]  [phone]  Schedule an appointment as soon as possible for a visit in 2 week(s)  regarding follow up for stiff person syndrome        TIME SPENT:   On discharge and care coordination is 35 minutes. I have spent a large amount of time explaining everything to the patient and family at bedside, going over all the details of  discharge diagnoses and the follow up plan.   Signed by: Durene Romans, DO, FACP    CC to: Malcolm Metro, MD

## 2022-02-07 NOTE — Plan of Care (Signed)
NURSING PROGRESS NOTE: STROKE UNIT    Patient Name: [name] (66 y.o. female)  Admission Date: 01/26/2022 [facility] Day 12)      Recent Labs   Lab 02/06/22  1003   Sodium 137   Potassium 3.7   Chloride 107   CO2 18   BUN 5.0*   Creatinine 0.5   EGFR >60.0   Glucose 88   Calcium 8.5       Recent Labs   Lab 02/06/22  1003   WBC 11.12*   Hgb 7.7*   Hematocrit 23.9*   Platelets 327         Patient Lines/Drains/Airways Status       Active Lines, Drains and Airways       Name Placement date Placement time Site Days    Temporary Catheter - Non-Tunneled 01/27/22 Internal Jugular Right 01/27/22  1525  -- 10    Midline IV 01/27/22 Anterior;Left Upper Arm 01/27/22  1738  Upper Arm  10    Urethral Catheter Non-latex 16 Fr. 02/03/22  1200  Non-latex  3                       Braden Scale Score: 15 (02/06/22 2000)      Skin Integrity: Scars, Blanchable Redness  Bruising Skin Location: Scattered Bruises    Wound 01/03/22 Stage 1 Coccyx;Sacrum unblanchable redness (Active)   Date First Assessed/Time First Assessed: 01/03/22 2200   Pressure Injury Staging (WOCN/ Trained RNs Only): Stage 1  Location: Coccyx;Sacrum  Wound Description (Comments): unblanchable redness  Present on Admission: Yes      Assessments 01/03/2022 11:00 PM 02/04/2022 10:00 PM   Site Description Clean;Dry;Intact Intact   Peri-wound Description Clean;Dry --   Closure Open to air --   Treatments Site care --   Dressing Foam --   Dressing Changed New --   Dressing Status Clean;Dry;Intact --       No associated orders.       Puncture Site 01/27/22 Venous Anterior;Right Neck (Active)   Date First Assessed/Time First Assessed: 01/27/22 1518   Puncture site location: Venous  Wound Location Orientation: Anterior;Right  Location: Neck      Assessments 01/27/2022  3:28 PM 02/06/2022  8:00 PM   Site Assessment Clean;Dry;Intact;Soft;Non Tender Clean;Dry;Intact   Dressing Status Dry;Intact Clean;Dry;Intact   Dressing Changed New --   Drainage Amount None --       No associated  orders.       Safety Checklist   1:1 Sitter N    Avasys N       Interpreter Services:  Does the patient require an Interpreter? N    If yes, what form of interpreter services was used? N/A    If family was utilized, is interpreter waiver form signed and in the chart? N/A      ASSESSMENT/PLAN:    Last BM: 02/05/2022      Pending Orders: [place] for BLE, PLEX today    Discharge Plan: SNF     POC / Family Update: Patient    Shift Note: Patient is alert and oriented x 4, follows command,sensation is intact, due meds given, VSS closely monitored. Safety maintained. Continue plan of care.        PPE worn in patient's room:  Standard      Problem: Moderate/High Fall Risk Score >5  Goal: Patient will remain free of falls  02/07/2022 0100 by Hildred Alamin, RN  Outcome: Progressing  Flowsheets (Taken 02/07/2022  0100)  High (Greater than 13):   HIGH-Bed alarm on at all times while patient in bed   HIGH-Consider use of low bed   HIGH-Initiate use of floor mats as appropriate  02/07/2022 0057 by Hildred Alamin, RN  Outcome: Progressing     Problem: Compromised Tissue integrity  Goal: Damaged tissue is healing and protected  Outcome: Progressing  Flowsheets (Taken 02/07/2022 0100)  Damaged tissue is healing and protected:   Monitor/assess Braden scale every shift   Increase activity as tolerated/progressive mobility   Reposition patient every 2 hours and as needed unless able to reposition self   Use bath wipes, not soap and water, for daily bathing   Monitor external devices/tubes for correct placement to prevent pressure, friction and shearing   Consider placing an indwelling catheter if incontinence interferes with healing of stage 3 or 4 pressure injury   Monitor patient's hygiene practices   Encourage use of lotion/moisturizer on skin  Goal: Nutritional status is improving  Outcome: Progressing  Flowsheets (Taken 02/07/2022 0100)  Nutritional status is improving:   Assist patient with eating   Collaborate with Clinical Nutritionist    Encourage patient to take dietary supplement(s) as ordered   Allow adequate time for meals   Include patient/patient care companion in decisions related to nutrition     Problem: Neurological Deficit  Goal: Neurological status is stable or improving  Outcome: Progressing  Flowsheets (Taken 02/07/2022 0100)  Neurological status is stable or improving:   Monitor/assess/document neurological assessment (Stroke: every 4 hours)   Monitor/assess NIH Stroke Scale   Re-assess NIH Stroke Scale for any change in status   Observe for seizure activity and initiate seizure precautions if indicated   Perform CAM Assessment     Problem: Impaired Mobility  Goal: Mobility/Activity is maintained at optimal level for patient  Outcome: Progressing  Flowsheets (Taken 02/07/2022 0100)  Mobility/activity is maintained at optimal level for patient:   Perform active/passive ROM   Increase mobility as tolerated/progressive mobility   Encourage independent activity per ability   Maintain proper body alignment   Consult/collaborate with Physical Therapy and/or Occupational Therapy   Assess for changes in respiratory status, level of consciousness and/or development of fatigue   Reposition patient every 2 hours and as needed unless able to reposition self

## 2022-02-07 NOTE — Progress Notes (Signed)
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     Francee Piccolo  RN Case Manager I

## 2022-02-07 NOTE — PT Progress Note (Signed)
Physical Therapy Treatment  [name]   Post Acute Care Therapy Recommendations:   Discharge Recommendations: SNF    If SNF recommended discharge disposition is not available, patient will need hands on assist for mobility and ADLs and HHPT.     DME needs IF patient discharges home: Front wheel walker, Wheelchair-manual, [facility]    Therapy discharge recommendations may change with patient status.  Please refer to most recent note for up-to-date recommendations.  Assessment:   Significant Findings: none    Pt approached for skilled PT tx with RN consent, received semisupine in bed, agreeable to participate. Tolerated seated LE therex as below, emphasizing importance of those muscle groups in walking. Walked in sneakers this session. Amb 57ft x2 with min A and RW. Slow, short step to to reciprocal gait. Decreased B step height, R foot catching. Benefits from cues to increase hip/knee/dorsi flexion in swing, increased difficulty with RLE. No knee buckling/bouncing noted. C/o dizziness after 1st walk, subsided quickly with seated rest, RN present. Completed session semisupine in bed, reporting RLE cramping when lifting into bed. Pt will continue to benefit from skilled PT services to address strength, balance and endurance impairments in order to maximize functional independence.        Assessment: Decreased LE strength, Decreased safety/judgement during functional mobility, Decreased endurance/activity tolerance, Decreased functional mobility, Decreased balance, Gait impairment, Impaired coordination, Impaired motor control  Progress: Progressing toward goals  Prognosis: Good, With continued PT status post acute discharge  Risks/Benefits/POC Discussed with Pt/Family: With patient  Patient left without needs and call bell within reach. RN notified of session outcome.     Treatment Activities: therex, gait training, pt education    Educated the patient to role of physical therapy, plan of care, goals of therapy and safety  with mobility and ADLs.    Plan:   Treatment/Interventions: Exercise, Gait training, Neuromuscular re-education, Functional transfer training, LE strengthening/ROM, Endurance training, Patient/family training, Equipment eval/education, Compensatory technique education        PT Frequency: 3-4x/wk     Continue plan of care.     Unit: [facility] TOWER 6 EAST  Bed: F613/F613.01    Precautions and Contraindications:   Precautions  Weight Bearing Status: no restrictions  Other Precautions: falls    Updated Medical Status/Imaging/Labs: Reviewed    Subjective: "It is more challenging to lift the legs when I step."   Patient Goal: to get better    Pain Assessment  Pain Assessment: No/denies pain    Patient's medical condition is appropriate for Physical Therapy intervention at this time.  Patient is agreeable to participation in the therapy session.    Objective:   Observation of Patient/Vital Signs:  Patient is in bed with peripheral IV, Quinton catheter in place.  Pt wore mask during therapy session:No      Cognition/Neuro Status  Arousal/Alertness: Appropriate responses to stimuli  Attention Span: Appears intact  Orientation Level: Oriented X4  Following Commands: Follows all commands and directions without difficulty  Behavior: calm;cooperative         Functional Mobility:  Supine to Sit: Supervision  Scooting to EOB: Supervision  Sit to Supine: Supervision  Sit to Stand: Contact Guard Assist;Minimal Assist  Stand to Sit: Contact Guard Assist;Minimal Assist       Ambulation:  PMP - Progressive Mobility Protocol   PMP Activity: Step 7 - Walks out of Room  Distance Walked (ft) (Step 6,7): 25 Feet     Ambulation: Minimal Assist;with front-wheeled walker  Pattern: R foot decreased clearance;decreased step length;decreased cadence;L foot flat;R foot flat    Therapeutic Exercise  Hip Flexion: x10 (Seated marching)  Knee AROM : x10 (LAQ)  Ankle Pumps: x20 (full ankle DF and PF)               Patient  Participation: good  Patient Endurance: fair    Patient left with call bell within reach, all needs met, SCDs off, fall mat in place, bed alarm on and all questions answered. RN notified of session outcome and patient response.     Goals:  Goals  Goal Formulation: With patient  Time for Goal Acheivement: 7 visits  Pt Will Perform Sit to Stand: with contact guard assist  Pt Will Transfer Bed/Chair: with rolling walker, with contact guard assist  Pt Will Ambulate: 31-50 feet, with rolling walker, with contact guard assist  Pt Will Propel Wheelchair: > 150 feet, independent    PPE worn during session: procedural mask and gloves    Tech present: none  PPE worn by tech: N/A    Leavy CellaErica Freida Nebel, PT, DPT Pager # [phone] 02/07/2022 12:00 PM       Time of Treatment  PT Received On: 02/07/22  Start Time: 1110  Stop Time: 1140  Time Calculation (min): 30 min  Treatment # 4 out of 7 visits

## 2022-02-07 NOTE — Progress Notes (Signed)
[facility] Consultation Progress Note    Date Time: 02/07/22 9:20 AM  Patient Name: [facility]  Outpatient Neurologist :    CC: LE weakness, paresthesia      Assessment:   66 yo F with PMHx of afib (on eliquis), HTN, HLD, DM, COPD, TIA, bipolar d/o, recent rhabdomyolysis (02/23), recent [facility] admission 3/13-01/26/22 for weakness and discharged to acute rehab 01/26/22. Pt called back to [facility] on recommendation of neurologist Dr. 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 PLEX extended to 5 sessions. In the future low threshold for muscle biopsy (rectus femoris recommended based on EMG results). On exam, pt with mild stiffness to LLE. Improvement in paresthesias, weakness.     -Workup on recent admission 3/13-4/5:  -CSF protein 52, WBC 1. ME panel, cx, HSV negative  -ENS2: +GAD65 Ab  -Ganglioside Ab panel neg  -Motor sensory neuropathy panel neg  -MG panel neg  -RPR, HIV nonreactive  -TSH, B12, copper,  wnl              -Anaphylactic response to IVIg (IgA 435) Lips, tongue swelling with itchiness.              -3/6 EMG showing a myopathic process affecting proximal BLE              -CT chest/abd/pelvis unremarkable              -CK 239, Vit D 10. LDH 450  -MRI femur bilat WWO 3/9: Mild edema and atrophy involving the semimembranosus and biceps femoris muscles of the right and left thighs, noted to be bilateral and symmetric in appearance.       -Workup this admission:  -CK 21  -MRI brain WWO: no acute findings; cerebellar volume loss; rathke's cleft cyst        Patient Active Problem List   Diagnosis    CVA (cerebral vascular accident)    Chest pain    Chest pain with high risk of acute coronary syndrome    Paroxysmal atrial fibrillation    Hypertension    Hyperlipidemia    Seizure disorder    History of  gastrointestinal hemorrhage    Depression    Anemia    Asthma    Non-traumatic subcutaneous emphysema    Chest pain with moderate risk for cardiac etiology    Left-sided weakness    History of asthma    History of transient ischemic attack (TIA)    Type 2 diabetes mellitus    Syncope and collapse    Weakness of both legs    Hypokalemia    Thoracic back pain    Gastroparesis    Guillain Barr syndrome    Myositis    Stiff person syndrome       Plan:   -PLEX x 5 sessions, #5/5 today  -Valium 2mg  q8hrs for leg stiffness, decreased 4/14 d/t drowsiness  -Supportive measures  -Ok for discharge from Neurology standpoint  -Recommend outpatient plan for muscle biopsy (rectus femoris) in 2-4 weeks to give chance to evaluate full benefit of PLEX  -F/u with [facility] in 2-4 weeks.      Neurology Attending Note:    Total time of the encounter was 35 minutes, of which >50% was spent reviewing the chart, updating orders, coordinating care, and at the bedside counseling. I  performed the substantive portion of the visit by providing more than 50% of the total encounter time, and spent 20 minutes. Patient was also seen by  APP  who spent .  I reviewed the chart, relevant neuro-imaging and labs on the above patient.    I also conferred with the midlevel and agree with the findings and plan outlined above.           Kendrick Fries, M.D.  Board Certified Neurology, ABPN  Board Certified NeuroImaging, UCNS   [facility]       Interval History/Subjective:   Pt endorses improvement in L hand / LLE paresthesias. She feels legs are unchanged. She endorses stiffness in LLE.      Medications:     Current Facility-Administered Medications   Medication Dose Route Frequency    apixaban  5 mg Oral Q12H SCH    budesonide-formoterol  2 puff Inhalation BID    diazePAM  2 mg Oral Q8H SCH    heparin (porcine)  5,000 Units Intracatheter Once    heparin (porcine)  5,000 Units Intracatheter Once    heparin (porcine)  5,000 Units  Intracatheter Once    heparin (porcine)  5,000 Units Intracatheter Once    lurasidone  120 mg Oral QHS    metoprolol succinate XL  37.5 mg Oral Q12H    midodrine  10 mg Oral BID Meals    pantoprazole  40 mg Oral QAM    polyethylene glycol  17 g Oral Daily    rOPINIRole  0.5 mg Oral QHS    senna-docusate  2 tablet Oral QHS    tamsulosin  0.4 mg Oral Daily    thiamine  100 mg Oral Daily    vitamin B-6  50 mg Oral Daily    vitamin D  25 mcg Oral Daily    vitamins/minerals  1 tablet Oral Daily    zinc sulfate  220 mg Oral Daily       Review of Systems:   Neurological ROS: negative except as noted in interval hx / subjective  Physical Exam:   Temp:  [97.7 F (36.5 C)-98.6 F (37 C)] 98 F (36.7 C)  Heart Rate:  [71-112] 112  Resp Rate:  [17-19] 18  BP: (107-118)/(67-79) 107/67    Vital Signs:  Reviewed    General: Well developed and well nourished. No acute distress. Cooperative with the exam  ENT: Normal oral mucosa, no ear or nose discharge  Neck: Symmetric, no deformities  CV: RRR  Resp: No audible wheezing, normal work of breathing  Abd: Nondistended  Skin: Intact, extremities normal in color  Psych: appropriate    Mental Status: The patient is awake, alert. Oriented to self, [place], April, 2023.  Fund of knowledge appropriate  Attention span and concentration appear normal.  Language function is normal. There is no evidence of aphasia in conversational speech.  No dysarthria    Cranial nerves:   -CN II: tracking  -CN III, IV, VI: Pupils equal, round, and reactive to light; EOMs intact; no ptosis. Nystagmus with R lateral gaze. No vertical nystagmus.              -CN V: Facial sensation intact in V1 through V3 distributions   -CN VII: trace L nasolabial fold flattening   -CN VIII: Hearing intact to conversational speech   -CN IX, X: Normal phonation   -CN XI: Symmetric full strength of trapezius muscles   -CN XII: Tongue protrudes midline    Motor: Muscle  tone normal without spasticity or flaccidity. Temporalis  atrophy bilat     UEs:   Deltoid Bicep Tricep Grip   Right 5 5 5 5    Left 5 5 5 5      LEs   HF KF KE PF DF   Right 2 5 5 5 5    Left 2 5 4+ 5 5     Sensory:   LT and Temp diminished left hand, left leg over shin only    Reflexes:  Patellar DTRs 0 bilat   Plantars: flexor b/l    Coordination: No tremors    Gait: ambulating with walker and 1-person assist.       Labs:     Results       Procedure Component Value Units Date/Time    Fibrinogen [[id number]][[id number]] Collected: 02/07/22 0435    Specimen: Blood Updated: 02/07/22 0529     Fibrinogen 252 mg/dL     Ionized Calcium - Post Filter [[id number]][[id number]] Collected: 02/07/22 0435     Updated: 02/07/22 0525     Calcium Ionized - Post Filter 2.43 mEq/L     Basic Metabolic Panel [[id number]][[id number]]  (Abnormal) Collected: 02/06/22 1003    Specimen: Blood Updated: 02/06/22 1042     Glucose 88 mg/dL      BUN 5.0 mg/dL      Creatinine 0.5 mg/dL      Calcium 8.5 mg/dL      Sodium 875137 mEq/L      Potassium 3.7 mEq/L      Chloride 107 mEq/L      CO2 18 mEq/L      Anion Gap 12.0    GFR [[id number]][[id number]] Collected: 02/06/22 1003     Updated: 02/06/22 1042     EGFR >60.0    CBC and differential [[id number]][[id number]]  (Abnormal) Collected: 02/06/22 1003    Specimen: Blood Updated: 02/06/22 1023     WBC 11.12 x10 3/uL      Hgb 7.7 g/dL      Hematocrit 16.023.9 %      Platelets 327 x10 3/uL      RBC 2.92 x10 6/uL      MCV 81.8 fL      MCH 26.4 pg      MCHC 32.2 g/dL      RDW 18 %      MPV 9.1 fL      Instrument Absolute Neutrophil Count 7.91 x10 3/uL      Neutrophils 71.1 %      Lymphocytes Automated 21.1 %      Monocytes 4.9 %      Eosinophils Automated 2.1 %      Basophils Automated 0.4 %      Immature Granulocytes 0.4 %      Nucleated RBC 0.0 /100 WBC      Neutrophils Absolute 7.91 x10 3/uL      Lymphocytes Absolute Automated 2.35 x10 3/uL      Monocytes Absolute Automated 0.54 x10 3/uL      Eosinophils Absolute Automated 0.23 x10 3/uL      Basophils Absolute Automated 0.04 x10 3/uL      Immature Granulocytes Absolute 0.05 x10  3/uL      Absolute NRBC 0.00 x10 3/uL             Rads:   US Venous Duplex Doppler Leg Bilateral    Result Date: 02/06/2022   No deep venous thrombosis of the BILATERAL lower extremities. Johnsie KindredFaris Haddad, MD 02/06/2022 1:57 AM  MRI Brain W WO Contrast    Result Date: 02/04/2022  1. No acute intracranial finding. 2. Mild global volume loss and chronic small vessel ischemic changes. 3. Prominent cerebellar volume loss. 4. 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 Gaylyn Rong, MD 02/04/2022 7:54 AM       Signed by:  Sarita Haver, PA PA-C   [facility]  [facility] Consult Spectra: [phone]  [facility] Consult Spectra: [phone]  Between 1630-1900, please epic chat: "[facility] Neurology"  After 1900, call: [phone]  1:05 PM  02/07/22    Please see attending Neurologist note that accompanies this mid-level encounter note.

## 2022-02-07 NOTE — Progress Notes (Signed)
[facility]       Music Therapy Services   [phone]    Music Therapy Session Note      [name]    MRN#: [id number]     F613/F613.01    Referred by: Music Therapy follow-up     Patient is agreeable to participation in the therapy session.    Diagnosis: Stiff person syndrome [G25.82]    Reason for Referral: engagement    Instruments/Activity: Guitar, singing, receptive music therapy    Patient preferred music:  All Kinds    Description of Session: Pt was sitting up in bed watching tv when tx approached. She was asked how she's been doing this past week since tx last saw her. She said she's doing better and should be leaving to go home some time today. She appeared to be very happy about her upcoming discharge. She expressed that she wanted to hear music again today but did not express what kinds of music she wanted to hear. Tx played through some age appropriate music for the pt. She expressed interest and gratitude towards the tx as she played through preferred music. The pt expressed enjoyment of the music being played for her. Near the end of the session, she asked tx to check in with nurse who peaked in during the session. Tx said she would as she left the session.     Patient's Response to Treatment: Pt presented with a flat affect which gradually changed as we listening to preferred music. Pt asked tx to check in with nurse that peaked her head in during the music therapy session to make sure what was needed was resolved. Tx informed nurse that the session was over after she left.     Pre-session pain level:     Post-session pain level:     Pre-session anxiety level:    Post-session anxiety level:     Plan: Continue with Music Therapy service to address engagement.     Cira Rue, MMT, MT-BC, NMT

## 2022-02-07 NOTE — Discharge Instr - AVS First Page (Addendum)
Reason for your Hospital Admission:  Stiff person syndrome      Instructions for after your discharge:  Follow up with providers listed below

## 2022-02-07 NOTE — Procedures (Signed)
Patient tolerated TPE #5 without complications. Consents, orders, allergies and lab results verified. Right IJ CVC aspirated and flushed per SOP, dressing is dry and intact. Dr. Pati Gallo present during procedure. Plan of care discussed with patient. Fluid balance - 56 ml. Report given to [facility]. Patient has no complaints.

## 2022-02-08 NOTE — Plan of Care (Signed)
NURSING PROGRESS NOTE: STROKE UNIT    Patient Name: [name] (66 y.o. female)  Admission Date: 01/26/2022 [facility] Day 13)      Recent Labs   Lab 02/06/22  1003   Sodium 137   Potassium 3.7   Chloride 107   CO2 18   BUN 5.0*   Creatinine 0.5   EGFR >60.0   Glucose 88   Calcium 8.5       Recent Labs   Lab 02/06/22  1003   WBC 11.12*   Hgb 7.7*   Hematocrit 23.9*   Platelets 327         Patient Lines/Drains/Airways Status       Active Lines, Drains and Airways       Name Placement date Placement time Site Days    Midline IV 01/27/22 Anterior;Left Upper Arm 01/27/22  1738  Upper Arm  11    Urethral Catheter Non-latex 16 Fr. 02/03/22  1200  Non-latex  4                       Braden Scale Score: 20 (02/08/22 0800)      Skin Integrity: Scars, Bruising, Other(Comment) (IJ removal site)  Bruising Skin Location: scattered    Wound 01/03/22 Stage 1 Coccyx;Sacrum unblanchable redness (Active)   Date First Assessed/Time First Assessed: 01/03/22 2200   Pressure Injury Staging (WOCN/ Trained RNs Only): Stage 1  Location: Coccyx;Sacrum  Wound Description (Comments): unblanchable redness  Present on Admission: Yes      Assessments 01/03/2022 11:00 PM 02/04/2022 10:00 PM   Site Description Clean;Dry;Intact Intact   Peri-wound Description Clean;Dry --   Closure Open to air --   Treatments Site care --   Dressing Foam --   Dressing Changed New --   Dressing Status Clean;Dry;Intact --       No associated orders.       Puncture Site 01/27/22 Venous Anterior;Right Neck (Active)   Date First Assessed/Time First Assessed: 01/27/22 1518   Puncture site location: Venous  Wound Location Orientation: Anterior;Right  Location: Neck      Assessments 01/27/2022  3:28 PM 02/08/2022  8:00 AM   Site Assessment Clean;Dry;Intact;Soft;Non Tender Clean;Dry;Intact   Dressing Status Dry;Intact Clean;Dry;Intact   Dressing Changed New --   Drainage Amount None --       No associated orders.       Safety Checklist   1:1 Sitter N    Avasys N       Interpreter  Services:  Does the patient require an Interpreter? N    If yes, what form of interpreter services was used? NA     If family was utilized, is interpreter waiver form signed and in the chart? NA      ASSESSMENT/PLAN:    Last BM: 4/16    Pending Orders: discharge    Discharge Plan: SNF    POC / Family Update: daughter at bedside     Shift Note: Pt alert and oriented x 4, speech clear, follows commands. MAE, moderate strength throughout, OOB x 1 assist with walker. C/o numbness and tingling in BL fingertips and toes, no c/o pain. PRN benadryl given x 1 for itching in BUE with good relief. VSS, tolerating regular diet with pills whole. Fall precautions in place: bed alarm on, bed in lowest position, call light within reach, fall mat in place.     1240: Pt discharged to [facility] via stretcher with all belongings. Daughter at bedside,  all questions answered. Report called to [facility].     PPE worn in patient's room: surgical mask, gloves      Problem: Moderate/High Fall Risk Score >5  Goal: Patient will remain free of falls  Outcome: Completed     Problem: Compromised Tissue integrity  Goal: Damaged tissue is healing and protected  Outcome: Completed  Goal: Nutritional status is improving  Outcome: Completed     Problem: Neurological Deficit  Goal: Neurological status is stable or improving  Outcome: Completed     Problem: Peripheral Neurovascular Impairment  Goal: Extremity color, movement, sensation are maintained or improved  Outcome: Completed     Problem: Impaired Mobility  Goal: Mobility/Activity is maintained at optimal level for patient  Outcome: Completed     Problem: Anxiety  Goal: Anxiety is at a manageable level  Outcome: Completed     Problem: Bladder/Voiding  Goal: Perineal skin integrity is maintained or improved  Outcome: Completed  Goal: Free from infection  Outcome: Completed  Goal: Patient will experience proper bladder emptying during admission  Outcome: Completed

## 2022-02-08 NOTE — Progress Notes (Signed)
Patient [name]  transport has been scheduled for 12.15 PM today (earliest available) through [facility] [phone].    Fawn Kirk  Case Management Assistant  Case Management Department  [facility]  P: [phone]  [email or web address]

## 2022-02-08 NOTE — Plan of Care (Signed)
Pt was seen and examined. VS, labs, imaging, micro data, medications reviewed. Pt is stable to discharge, pending bed placement.

## 2022-02-08 NOTE — Plan of Care (Addendum)
Problem: Moderate/High Fall Risk Score >5  Goal: Patient will remain free of falls  Outcome: Progressing  Flowsheets (Taken 02/08/2022 0000)  High (Greater than 13):   HIGH-Consider use of low bed   HIGH-Initiate use of floor mats as appropriate   HIGH-Pharmacy to initiate evaluation and intervention per protocol   HIGH-Apply yellow "Fall Risk" arm band   HIGH-Utilize chair pad alarm for patient while in the chair   HIGH-Bed alarm on at all times while patient in bed   HIGH-Visual cue at entrance to patient's room     Problem: Neurological Deficit  Goal: Neurological status is stable or improving  Outcome: Progressing  Flowsheets (Taken 02/08/2022 0122)  Neurological status is stable or improving:   Monitor/assess/document neurological assessment (Stroke: every 4 hours)   Re-assess NIH Stroke Scale for any change in status   Perform CAM Assessment   Observe for seizure activity and initiate seizure precautions if indicated     Problem: Bladder/Voiding  Goal: Perineal skin integrity is maintained or improved  Outcome: Progressing  Flowsheets (Taken 02/08/2022 0123)  Perineal skin integrity is maintained or improved:   Keep intact skin clean and dry   Apply urinary containment device as appropriate and/or per order   Use protective skin barriers to decrease potential skin breakdown   Consult/collaborate with Wound Care Nurse  Goal: Free from infection  Outcome: Progressing  Flowsheets (Taken 02/08/2022 0123)  Free from infection:   Monitor/assess for signs and symptoms of infection   Assess need for indwelling catheter every shift and discuss with LIP  Goal: Patient will experience proper bladder emptying during admission  Outcome: Progressing  Flowsheets (Taken 02/08/2022 0123)  Patient will experience proper bladder emptying during admission:   Monitor intake and output   Utilize bladder scans prior to or post void as appropriate   Encourage patient to empty bladder at regular intervals   Patient will be in and out  catheterized if unable to void  NURSING PROGRESS NOTE: STROKE UNIT    Patient Name: [name] (66 y.o. female)  Admission Date: 01/26/2022 [facility] Day 13)      Recent Labs   Lab 02/06/22  1003   Sodium 137   Potassium 3.7   Chloride 107   CO2 18   BUN 5.0*   Creatinine 0.5   EGFR >60.0   Glucose 88   Calcium 8.5       Recent Labs   Lab 02/06/22  1003   WBC 11.12*   Hgb 7.7*   Hematocrit 23.9*   Platelets 327         Patient Lines/Drains/Airways Status       Active Lines, Drains and Airways       Name Placement date Placement time Site Days    Midline IV 01/27/22 Anterior;Left Upper Arm 01/27/22  1738  Upper Arm  11    Urethral Catheter Non-latex 16 Fr. 02/03/22  1200  Non-latex  4                       Braden Scale Score: 18 (02/07/22 2000)      Skin Integrity: Scars  Bruising Skin Location: scattered    Wound 01/03/22 Stage 1 Coccyx;Sacrum unblanchable redness (Active)   Date First Assessed/Time First Assessed: 01/03/22 2200   Pressure Injury Staging (WOCN/ Trained RNs Only): Stage 1  Location: Coccyx;Sacrum  Wound Description (Comments): unblanchable redness  Present on Admission: Yes      Assessments 01/03/2022 11:00  PM 02/04/2022 10:00 PM   Site Description Clean;Dry;Intact Intact   Peri-wound Description Clean;Dry --   Closure Open to air --   Treatments Site care --   Dressing Foam --   Dressing Changed New --   Dressing Status Clean;Dry;Intact --       No associated orders.       Puncture Site 01/27/22 Venous Anterior;Right Neck (Active)   Date First Assessed/Time First Assessed: 01/27/22 1518   Puncture site location: Venous  Wound Location Orientation: Anterior;Right  Location: Neck      Assessments 01/27/2022  3:28 PM 02/07/2022  8:00 PM   Site Assessment Clean;Dry;Intact;Soft;Non Tender Clean;Dry;Intact   Dressing Status Dry;Intact Clean;Dry;Intact   Dressing Changed New Reinforced   Drainage Amount None None       No associated orders.       Safety Checklist   1:1 Sitter N    Avasys N       Interpreter  Services:  Does the patient require an Interpreter? N    If yes, what form of interpreter services was used? N/a    If family was utilized, is interpreter waiver form signed and in the chart? N/a      ASSESSMENT/PLAN:    Last BM: 04/15    Pending Orders: discharge    Discharge Plan: SNF    POC / Family Update: pt    Shift Note: Pt a/o x 4, follows commands, clear speech w/delayed response. Mae with generalized weakness. Reports numbness/tingling to bil fingertips and toes. On Ra, No tele. Bp goal<180. Discharge order in place.   C/o facial itching, prn benadryl given with good relief.     Regular diet, whole pills with thins.   Foley in place, care rendered. Bed mobility overnight. Per report pt able to walk with one assist using the walker.   LUE midline C/D/intact. R IJ removed after finishing PLEX treatment on 04/17, covered with Clean & dry dressing. Prn dilaudid given for neck pain after IJ removal.     Fall & safety measures in place:- call bell within reach, bed alarm on and floor mat present.   PPE worn in patient's room: mask and gloves

## 2022-02-08 NOTE — Progress Notes (Signed)
02/08/22 1056   Discharge Disposition   Patient preference/choice provided? Yes   Physical Discharge Disposition SNF   Receiving facility, unit and room number: [facility] # room: 216   Nursing report phone number: [phone]   Mode of Transportation Other (comment)  (stretcher transportation via [employer])   Pick up time 12:15 PM   Patient/Family/POA notified of transfer plan Yes  (pt's daughter Morrell Riddle # [phone] made aware via phone)   Patient agreeable to discharge plan/expected d/c date? Yes   Family/POA agreeable to discharge plan/expected d/c date? Yes   Bedside nurse notified of transport plan? Yes   Hard copy of narcotic RX sent with patient? Yes   CM Interventions   Follow up appointment scheduled?   (patient discharing to SNF)   Reason no follow up scheduled? Discharge to SNF/AR/LTAC   Multidisciplinary rounds/family meeting before d/c? Yes   Medicare Checklist   Is this a Medicare patient? Yes   If LOS 3 days or greater, did patient received 2nd IMM Letter? Yes   Date of 2nd IMM Letter 02/08/22     Francee Piccolo  RN Case Manager I

## 2022-02-24 NOTE — ED Provider Notes (Signed)
[facility] Emergency Department  History and Physical Exam     Patient Name: [name]   Date of Birth: 18-Apr-1956  Medical Record Number: [id number]     History of Present Illness     Chief Complaint:   Chief Complaint   Patient presents with   . Chest Pain     Pt brought in by Ems from [facility] for L sided chest pain x11 hours.        History Provided By: Patient     History: [name] is a 66 y.o. female with History of paroxysmal atrial fibrillation, on Eliquis, diabetes, CVA/TIA, hypertension, mild to moderate aortic insufficiency who presents to the hospital with chest pain.  Reportedly had cardiac catheterization not showing significant obstructive disease in 2017.  Patient reports that she has not had any heart surgery or coronary stents in the past  Patient presents today because of chest pain.  Reports that the symptoms started at 10 or 11 AM while she was sitting and watching TV.  She does report a dull chest pressure and shortness of breath worsened on exertion and with lying flat.  Also has noticed that her lower legs have been swelling up.  Patient denies history of PE/DVT.  Is compliant on Eliquis.  Reports that she is a lifetime non-smoker.  Allergic to contrast, her reaction is hives     Primary care provider: NONE PCP      Past History  Past Medical History:   No past medical history on file.  See HPI     Past Surgical History:    No past surgical history on file.    Family History:   No family history on file.     Social History:        Allergies:   Allergies   Allergen Reactions   . Iodinated Contrast Media    . Latex    . Myoview Kit [Kit Prep Of Tc-24m-Tetrofosmin]    . Nitroglycerin    . Penicillin    . Singulair [Montelukast]    . Tetracycline         Home Medications:  Hospital Medications          morphine injection 2 mg (Completed)    morphine injection 2 mg    morphine injection 2 mg (Completed)    ondansetron (ZOFRAN) injection 4 mg (Completed)                 Review of  Systems   Review of Systems  As reviewed in HPI.  Otherwise normal.     Physical Exam     ED Triage Vitals [02/24/22 2032]   Temp Heart Rate Resp BP SpO2   36.9 C (98.5 F) (!) 107 20 116/65 99 %      Temp Source Heart Rate Source Patient Position BP Location FiO2 (%) Set   Oral Monitor Sitting Left arm --        Physical Exam  General: Alert and oriented, in no acute distress.  Sitting up in bed no acute distress  HEENT: Normocephalic atraumatic  Neck: Normal ROM  Pulmonary: Clear to auscultation bilaterally, respirations nonlabored  Cardiovascular: Heart rate in the 110s, regular rhythm  Gastrointestinal: Soft nondistended no focal tenderness to palpation  Extremities: Nonpitting edema bilateral lower extremities without focal tenderness  Skin: warm and well perfused  Neurologic: Alert and orientedx3, moving all 4 extremities  Psychiatric: Cooperative       Diagnostic Study Results  Labs -   Labs Reviewed   PROTIME-INR - Abnormal       Result Value    Protime 13.6 (*)     INR 1.18      Narrative:     RECOMMENDED RANGES FOR INR:    2.0-3.0 for most medical and surgical thromboembolitic states    2.5-3.5 for mechanical prosthetic heart valves     It is recommended that the INR is to be used only for the management of patients on stable (>2 weeks) oral anticoagulant therapy.       COMPREHENSIVE METABOLIC PANEL - Abnormal    Sodium 138      Potassium 3.4 (*)     Chloride 105      CO2 29      BUN 8      Creatinine 0.42 (*)     Glucose 93      Calcium 8.3 (*)     AST 21      ALT (SGPT) 17      Alkaline Phosphatase 67      Total Protein 4.7 (*)     Albumin 2.8 (*)     eGFR 126      Total Bilirubin 0.20     CBC WITH AUTO DIFFERENTIAL - Abnormal    Auto WBC 7.5      RBC 3.03 (*)     Hemoglobin 8.1 (*)     Hematocrit 25.0 (*)     MCV 82.5      MCH 26.7 (*)     MCHC 32.4      RDW 18.3 (*)     Platelets 400      MPV 8.5 (*)     nRBC 0      Neutrophils Relative 61.5      Lymphocytes Relative 28.6      Monocytes Relative 6.4       Eosinophils Relative 2.7      Basophils Relative 0.4      IG% 0.40      Neutrophils Absolute 4.62      Lymphocytes Absolute 2.15      Monocytes Absolute 0.48      Eosinophils Absolute 0.20      Basophils Absolute 0.03      Immature Granulocytes Absolute 0.03     COVID-19/INFLUENZA A/B, LIAT - Normal    SARS-CoV-2, PCR Negative      Flu A PCR Negative      Flu B PCR Negative      Narrative:     ------------------------ADDITIONAL INFORMATION-----------------------------   Testing was performed using the cobas SARS-CoV-2 & Influenza A/B Nucleic Acid Test for use on the cobas Liat   System Fact sheet for this Emergency Use Authorization (EUA) assay can be found at the following links:   Fact sheet for health care providers: CoursePreviews.dk  Fact sheet for patients: SalonClasses.at   Test performed by:      [facility]      [address] [place] [address]      Lab Director: Cynda Familia, MD   APTT - Normal    aPTT 32     LIPASE - Normal    Lipase 74     TROPONIN I - Normal    Troponin I <0.012      Narrative:     0.120 ng/mL or greater is indicative of acute myocardial infarction.    NT-PRO B-TYPE NATRIURETIC PEPTIDE - Normal    NT PRO  BNP 78.1     TROPONIN I - Normal    Troponin I <0.012      Narrative:     0.120 ng/mL or greater is indicative of acute myocardial infarction.    CBC WITH DIFFERENTIAL    Narrative:     The following orders were created for panel order CBC and differential.  Procedure                               Abnormality         Status                     ---------                               -----------         ------                     CBC Auto Differential[[id number]]         Abnormal            Final result                 Please view results for these tests on the individual orders.   D-DIMER, QUANTITATIVE    D-Dimer  <500      Narrative:     For exclusion of DVT, result should be <500 ng/ml  FEU    Reference Interval <500 ng/mL FEU    Quantitative DDimer >500 ng/mL FEU: nonspecific,  can be seen in multiple conditions, cannot exclude thromboembolic event.    Age-adjusted D-dimer thresholds (age x 10 ng/mL rather than 500 ng/mL) can be used in patients over 50 to determine whether imaging is warranted for the evaluation of pulmonary embolism.          Radiologic Studies -  Xray Chest; 1 View   Final Result   IMPRESSION:      1.  No evidence of acute cardiopulmonary disease .      Electronically signed by Theodore Demark, MD on 02/24/2022 9:22 PM           Medical Decision Making       I, Jena Gauss MD, provided the initial evaluation of the patient and am the provider of record unless otherwise noted. I reviewed the vital signs, available nursing notes, past medical history, past surgical history, family history and social history.     Independent Historian:  Patient     External Data Reviewed: External care everywhere records, [facility] records from 02/09/2022    Social determinants of health: Did not significantly impact care today    Tests or treatments considered but not necessary today:  Not applicable_      Laboratory analysis including labs ordered, interpreted by me:  See ED Course    66 year old female presents to the emergency department for chest pain as detailed above.  Significant past medical history including atrial fibrillation on Eliquis, hypertension, diabetes, COPD  Differential diagnosis: ACS versus PE versus pneumonia versus new onset CHF  We will obtain basic labs including D-dimer, troponin, BNP  EKG on my independent interpretation shows sinus tachycardia 110 bpm normal axis and intervals, diffuse T wave inversions particularly in the lateral leads and precordial leads, this is overall comparable to prior EKG on 02/09/2022, at that time patient had presented  to the ED for similar symptoms but then left AMA before inpatient work-up could be completed  Heart score is elevated due to  significant cardiovascular risk factors, I anticipate hospitalization even if work-up is negative  Patient was notified of the plan and is agreeable  Patient is requesting pain medication for chest pain, will administer 2 mg of IV morphine and 2 mg of IV Zofran; she has already received 324 mg of aspirin prehospital     History: 1=Moderately suspicious; ECG: 1=non specific repolarization disturbance [includes unchanged repolarization abnormalities, old LBBB, RBBB, LVH, and changes attributed to digoxin]; Age:2= 45-65 years; Risk factors: 2= 3 or more risk factors or h/o atherosclerotic disease [coronary revascularization, MI, stroke, or peripheral arterial disease]; Troponin: 0=equal or below the normal limit. TOTAL SCORE: 4.  Risk factors are: HTN, hypercholesterolemia, DM, obesity (BMI >30), smoking (current, or smoking cessation <=3 mo), positive family history (parent or sibling with CVD before age 65). Atherosclerotic disease: prior MI, PCI/CABG, CVA/TIA, or peripheral arterial disease.        ED Course:   ED Course as of 02/25/22 0204   Thu Feb 24, 2022   2220 D-Dimer : <500 [HZ]   2220 Troponin I: <0.012 [HZ]   2220 Hemoglobin(!): 8.1  Stable from prior [HZ]   2221 Xray Chest; 1 View  IMPRESSION:    1.  No evidence of acute cardiopulmonary disease . [HZ]   2239 ED work-up overall negative as detailed above, discussed hospitalization with the patient who is agreeable, will contact hospitalist [HZ]   2323 Spoke with hospitalist team, I was informed that patient actually had a negative cardiac catheterization in October 2022 and was cleared by cardiology, this does change the management somewhat as this makes new obstructive coronary disease highly unlikely, I spoke with the patient who is comfortable with repeating troponin status post 3 hours and then discharging if the troponin is reassuring [HZ]   Fri Feb 25, 2022   0057 EKG 12 lead  EKG on my independent interpretation shows normal sinus rhythm 96 bpm  normal axis and intervals diffuse T wave inversions noted stable from prior EKG [HZ]   0203 Troponin I: <0.012  Repeat troponin negative, stable for discharge, patient will follow-up with her cardiologist Dr. Aneta Mins, discussed return precautions [HZ]      ED Course User Index  [HZ] Jena Gauss, MD             Disposition and Diagnosis     Diagnoses for today's visit:   1. Acute chest pain Acute     New Prescriptions    No medications on file     _______________________________     Some transcription errors may be present due to use of Dragon (voice recognition) software.     Jena Gauss, MD  02/25/22 [phone]

## 2022-03-01 ENCOUNTER — Ambulatory Visit (INDEPENDENT_AMBULATORY_CARE_PROVIDER_SITE_OTHER): Payer: Medicare Other | Admitting: Cardiovascular Disease

## 2022-03-05 ENCOUNTER — Emergency Department
Admission: EM | Admit: 2022-03-05 | Discharge: 2022-03-05 | Disposition: A | Discharge status: Home / Self Care | Payer: 59 | Attending: Emergency Medical Services | Admitting: Emergency Medical Services

## 2022-03-05 ENCOUNTER — Emergency Department: Payer: 59

## 2022-03-05 DIAGNOSIS — R0789 Other chest pain: Secondary | ICD-10-CM | POA: Insufficient documentation

## 2022-03-05 LAB — CBC AND DIFFERENTIAL
Absolute NRBC: 0 10*3/uL (ref 0.00–0.00)
Basophils Absolute Automated: 0.04 10*3/uL (ref 0.00–0.08)
Basophils Automated: 0.7 %
Eosinophils Absolute Automated: 0.18 10*3/uL (ref 0.00–0.44)
Eosinophils Automated: 3.1 %
Hematocrit: 24.7 % — ABNORMAL LOW (ref 34.7–43.7)
Hgb: 8 g/dL — ABNORMAL LOW (ref 11.4–14.8)
Immature Granulocytes Absolute: 0.02 10*3/uL (ref 0.00–0.07)
Immature Granulocytes: 0.3 %
Instrument Absolute Neutrophil Count: 2.68 10*3/uL (ref 1.10–6.33)
Lymphocytes Absolute Automated: 2.43 10*3/uL (ref 0.42–3.22)
Lymphocytes Automated: 42.3 %
MCH: 27.1 pg (ref 25.1–33.5)
MCHC: 32.4 g/dL (ref 31.5–35.8)
MCV: 83.7 fL (ref 78.0–96.0)
MPV: 8.9 fL (ref 8.9–12.5)
Monocytes Absolute Automated: 0.4 10*3/uL (ref 0.21–0.85)
Monocytes: 7 %
Neutrophils Absolute: 2.68 10*3/uL (ref 1.10–6.33)
Neutrophils: 46.6 %
Nucleated RBC: 0 /100 WBC (ref 0.0–0.0)
Platelets: 369 10*3/uL — ABNORMAL HIGH (ref 142–346)
RBC: 2.95 10*6/uL — ABNORMAL LOW (ref 3.90–5.10)
RDW: 18 % — ABNORMAL HIGH (ref 11–15)
WBC: 5.75 10*3/uL (ref 3.10–9.50)

## 2022-03-05 LAB — ECG 12-LEAD
Atrial Rate: 86 {beats}/min
IHS MUSE NARRATIVE AND IMPRESSION: NORMAL
P Axis: 66 degrees
P-R Interval: 150 ms
Q-T Interval: 376 ms
QRS Duration: 82 ms
QTC Calculation (Bezet): 449 ms
R Axis: 38 degrees
T Axis: 221 degrees
Ventricular Rate: 86 {beats}/min

## 2022-03-05 LAB — COMPREHENSIVE METABOLIC PANEL
ALT: 24 U/L (ref 0–55)
AST (SGOT): 31 U/L (ref 5–41)
Albumin/Globulin Ratio: 0.9 (ref 0.9–2.2)
Albumin: 2.3 g/dL — ABNORMAL LOW (ref 3.5–5.0)
Alkaline Phosphatase: 52 U/L (ref 37–117)
Anion Gap: 11 (ref 5.0–15.0)
BUN: 7 mg/dL (ref 7.0–21.0)
Bilirubin, Total: 0.2 mg/dL (ref 0.2–1.2)
CO2: 23 mEq/L (ref 17–29)
Calcium: 8.2 mg/dL — ABNORMAL LOW (ref 8.5–10.5)
Chloride: 107 mEq/L (ref 99–111)
Creatinine: 0.5 mg/dL (ref 0.4–1.0)
Globulin: 2.6 g/dL (ref 2.0–3.6)
Glucose: 81 mg/dL (ref 70–100)
Potassium: 3.6 mEq/L (ref 3.5–5.3)
Protein, Total: 4.9 g/dL — ABNORMAL LOW (ref 6.0–8.3)
Sodium: 141 mEq/L (ref 135–145)
eGFR: >60 mL/min/{1.73_m2} (ref >=60)

## 2022-03-05 LAB — HIGH SENSITIVITY TROPONIN-I WITH DELTA
hs Troponin-I Delta: -0.7 ng/L
hs Troponin-I: 2.7 ng/L

## 2022-03-05 LAB — HIGH SENSITIVITY TROPONIN-I: hs Troponin-I: 3.4 ng/L

## 2022-03-05 MED ORDER — ACETAMINOPHEN 500 MG PO TABS
1000.0000 mg | ORAL_TABLET | Freq: Once | ORAL | Status: AC
Start: 2022-03-05 — End: 2022-03-05
  Administered 2022-03-05: 1000 mg via ORAL
  Filled 2022-03-05: qty 2

## 2022-03-05 MED ORDER — OXYCODONE-ACETAMINOPHEN 5-325 MG PO TABS
1.0000 | ORAL_TABLET | Freq: Once | ORAL | Status: AC
Start: 2022-03-05 — End: 2022-03-05
  Administered 2022-03-05: 1 via ORAL
  Filled 2022-03-05: qty 1

## 2022-03-05 MED ORDER — MORPHINE SULFATE 2 MG/ML IJ/IV SOLN (WRAP)
2.0000 mg | Freq: Once | Status: AC
Start: 2022-03-05 — End: 2022-03-05
  Administered 2022-03-05: 2 mg via INTRAVENOUS
  Filled 2022-03-05: qty 1

## 2022-03-05 MED ORDER — MORPHINE SULFATE 4 MG/ML IJ/IV SOLN (WRAP)
4.0000 mg | Freq: Once | Status: AC
Start: 2022-03-05 — End: 2022-03-05
  Administered 2022-03-05: 4 mg via INTRAVENOUS
  Filled 2022-03-05: qty 1

## 2022-03-05 MED ORDER — ASPIRIN 81 MG PO CHEW
162.0000 mg | CHEWABLE_TABLET | Freq: Once | ORAL | Status: AC
Start: 2022-03-05 — End: 2022-03-05
  Administered 2022-03-05: 162 mg via ORAL
  Filled 2022-03-05: qty 2

## 2022-03-05 NOTE — ED Provider Notes (Addendum)
PATIENT EVALUATED IN CONJUNCTION WITH THE Resident MD. I  PERSONALLY SAW AND EXAMINED THE PATIENT. I FOUND NO DISCREPANCIES  IN THE Resident PROVIDERS EVALUATION. I AGREE WITH HIS/HER  DOCUMENTATION, INTERPRETATION OF DIAGNOSTIC TESTS AND DISPOSITION.    66 year old female with history of atrial fibrillation on chronic anticoagulation, hypertension, hyperlipidemia, type 2 diabetes, COPD, here with recurrent chest pain.  Chest pain is sore/sharp, radiates to the left upper extremity, not exertional or pleuritic.  Similar pain about 2 months ago, I reviewed her records and at that time she had a CT angiogram of the chest which was negative as well as TTE that showed EF of 43%    EKG: Personally reviewed and interpreted by me.   Normal sinus 86. No signs of concerning ST elevation, no PR, QRS, or QT prolongation.  Mild ST depression laterally, similar to prior  Nml Twaves compared to prior.      MDM:  Well-appearing female, subacute/recurrent chest pain.  EKG without changes concerning for acute ischemia or infarct.  I have low suspicion acutely for clinically significant ACS.    I have minimal suspicion for PE as patient is low-risk by Well's criteria. Also maintain low suspicion for acute etiology as aortic dissection, pericardial effusion, PNA, PTX as patient's hs and exam are not consistent with any of these. We did however discuss sx of these for which to return, should they develop - patient expresses understanding.    Monitor, serial trops, pain control, reeval    630a -patient doing well overall, reports she feels symptomatically improved but states her pain is 7 out of 10 and requesting more pain medication.  Awaiting repeat troponin, repeat dose of morphine, signed out to morning MD to reevaluate pain and troponin result     Vergie Zahm E, MD  03/05/22 0446       Lafe Garin, MD  03/05/22 6811       Lafe Garin, MD  03/05/22 [phone]

## 2022-03-05 NOTE — Discharge Instructions (Addendum)
Dear Lorenda Hatchet:  Today, you were seen in the ER for chest pain. Your EKG, chest x-ray, and bloodwork were reassuring, but did not reveal any explanation for your symptoms. The exact cause for your pain is unclear. However, things can change, and you should return to the ER or call 911 if you have: worsening of your chest pain, difficulty breathing, or any other new or concerning symptoms, as these may be a sign of a new problem or worsening of your condition.  Please follow-up with your primary doctor within 1-2 days for re-evaluation.  Thank you for choosing the [facility] Emergency Department, the premier emergency department in the [place] area.  I hope your visit today was EXCELLENT.    IF YOU DO NOT CONTINUE TO IMPROVE OR YOUR CONDITION WORSENS, PLEASE CONTACT YOUR DOCTOR OR RETURN IMMEDIATELY TO THE EMERGENCY DEPARTMENT.    Sincerely,  Tavoulareas, Demetrios E*  Attending Emergency Physician  [facility] Emergency Department    ONSITE PHARMACY  Our full service onsite pharmacy is located in the ER waiting room.  Open 7 days a week from 9 am to 9 pm.  We accept all major insurances and prices are competitive with major retailers.  Ask your provider to print your prescriptions down to the pharmacy to speed you on your way home.    OBTAINING A PRIMARY CARE APPOINTMENT    Primary care physicians (PCPs, also known as primary care doctors) are either internists or family medicine doctors. Both types of PCPs focus on health promotion, disease prevention, patient education and counseling, and treatment of acute and chronic medical conditions.    Call for an appointment with a primary care doctor.  Ask to see who is taking new patients.     [facility]  telephone:  [phone]  [email or web address]    DOCTOR REFERRALS  Call [phone] (available 24 hours a day, 7 days a week) if you need any further referrals and we can help you find a primary care doctor or specialist.  Also, available  online at:  [email or web address]    YOUR CONTACT INFORMATION  Before leaving please check with registration to make sure we have an up-to-date contact number.  You can call registration at [phone] to update your information.  For questions about your hospital bill, please call [phone].  For questions about your Emergency Dept Physician bill please call [phone].      FREE HEALTH SERVICES  If you need help with health or social services, please call 2-1-1 for a free referral to resources in your area.  2-1-1 is a free service connecting people with information on health insurance, free clinics, pregnancy, mental health, dental care, food assistance, housing, and substance abuse counseling.  Also, available online at:  http://www.211[place].org    MEDICAL RECORDS AND TESTS  Certain laboratory test results do not come back the same day, for example urine cultures.   We will contact you if other important findings are noted.  Radiology films are often reviewed again to ensure accuracy.  If there is any discrepancy, we will notify you.      Please call [phone] to pick up a complimentary CD of any radiology studies performed.  If you or your doctor would like to request a copy of your medical records, please call [phone].      ORTHOPEDIC INJURY   Please know that significant injuries can exist even when an initial x-ray is read as normal or negative.  This can occur because  some fractures (broken bones) are not initially visible on x-rays.  For this reason, close outpatient follow-up with your primary care doctor or bone specialist (orthopedist) is required.    MEDICATIONS AND FOLLOWUP  Please be aware that some prescription medications can cause drowsiness.  Use caution when driving or operating machinery.    The examination and treatment you have received in our Emergency Department is provided on an emergency basis, and is not intended to be a substitute for your primary care  physician.  It is important that your doctor checks you again and that you report any new or remaining problems at that time.      [place]  The nearest 24 hour pharmacy is:    [facility] [place], [place] [address]  [address]  [facility])  Call to start or finish an application, compare plans, enroll or ask a question.  [place]: 8205616347  Web:  Healthcare.gov    Help Enrolling in [facility]  5317675022 (TOLL-FREE)  678-296-9421 (TTY)  Web:  Http://www.coverva.org    Local Help Enrolling in the [place]  (515)242-8663 (MAIN)  Email:  health-help'@nvfs'$ .org  Web:  http://lewis-perez.info/  Address:  [id number] [place], [place] [address]    SEDATING MEDICATIONS  Sedating medications include strong pain medications (e.g. narcotics), muscle relaxers, benzodiazepines (used for anxiety and as muscle relaxers), Benadryl/diphenhydramine and other antihistamines for allergic reactions/itching, and other medications.  If you are unsure if you have received a sedating medication, please ask your physician or nurse.  If you received a sedating medication: DO NOT drive a car. DO NOT operate machinery. DO NOT perform jobs where you need to be alert.  DO NOT drink alcoholic beverages while taking this medicine.     If you get dizzy, sit or lie down at the first signs. Be careful going up and down stairs.  Be extra careful to prevent falls.     Never give this medicine to others.     Keep this medicine out of reach of children.     Do not take or save old medicines. Throw them away when outdated.     Keep all medicines in a cool, dry place. DO NOT keep them in your bathroom medicine cabinet or in a cabinet above the stove.    MEDICATION REFILLS  Please be aware that we cannot refill any prescriptions through the ER. If you need further treatment from what is  provided at your ER visit, please follow up with your primary care doctor or your pain management specialist.    [place]  Did you know [facility] has two freestanding ERs located just a few miles away?  [facility] [place] have short wait times, easy free parking directly in front of the building and top patient satisfaction scores - and the same Board Certified Emergency Medicine doctors as [facility].

## 2022-03-05 NOTE — ED Notes (Signed)
Pt has foley in place from rehab

## 2022-03-05 NOTE — ED Triage Notes (Signed)
Pt biba from [facility] for c/o chest pain 8/10 that started about 3 hours ago. Pt reports left sided chest pain radiating to the front and up the shoulder. Pt also endorsing nausea, but no vomiting. Pt presents to the ED with foley catheter in place x6 days, denies any fevers at home, denies any issues with foley catheter. Pt reports recent positive covid test, endorses shortness of breath, and pain with inhalation. Pt AOx4, answers questions appropriately, able to speak in full sentences, maintains airway.

## 2022-03-05 NOTE — ED Provider Notes (Signed)
[name] EMERGENCY DEPARTMENT RESIDENT H&P       CLINICAL INFORMATION        HPI:      Chief Complaint: Chest Pain  .        Chest Pain     66 year old female with past medical history of A-fib on Eliquis, TIA, chronic Foley, hypertension, hyperlipidemia asthma, diabetes, presenting with several hours of chest pain that is central in nature and radiates into the left upper extremity as well as the left side of the neck.  Pain is not associated with any exertion or shortness of breath.  Patient states that she feels slightly nauseous but has not had any vomiting or abdominal pain or diarrhea.  Patient denies any leg pain.  Patient states that she has had this pain before which resulted in negative work-up.  Patient denies any fevers or other recent or current infectious symptoms including sore throat, congestion, cough, rash.  History obtained from: patient      Nursing (triage) note reviewed for the following pertinent information:  Arrived via EMS from [place] for c/c of left sided chest pain, started approximately prior to calling EMS. States pain is sharp in nature to left chest radiating to left back and up left neck. Rated at 9/10. Deep breathing and movement make pain worse. 12-lead negative for EMS. Given 162mg  ASA en route. Diagnosed with COVID 03/04/22, symptoms started on the same day testing positive. Has SOB, non productive cough, nausea. No reported fever, vomiting. History of a-fib and takes eliquis. Was admitted to [facility] the afternoon of 03/04/22.    Triage vitals: BP: 138/62, Heart Rate: 89, Temp: 98.2 F (36.8 C), Resp Rate: 18, SpO2: 100 %, Weight: 81.6 kg        ROS:    Review of Systems   Cardiovascular:  Positive for chest pain.    negative except for HPI.    Physical Exam:    Pulse 89  BP 138/62  Resp 18  SpO2 100 %  Temp 98.2 F (36.8 C)    Physical Exam  Constitutional:       General: She is not in acute distress.     Appearance: Normal  appearance.   HENT:      Head: Normocephalic and atraumatic.      Nose: No nasal deformity.      Mouth/Throat:      Lips: Pink.      Mouth: Mucous membranes are moist.   Eyes:      General: Lids are normal. Vision grossly intact.      Extraocular Movements: Extraocular movements intact.      Conjunctiva/sclera: Conjunctivae normal.   Cardiovascular:      Rate and Rhythm: Normal rate.      Pulses:           Radial pulses are 2+ on the right side and 2+ on the left side.        Dorsalis pedis pulses are 2+ on the right side and 2+ on the left side.      Heart sounds: Normal heart sounds.      Comments: Appears well perfused.  Pulmonary:      Effort: Pulmonary effort is normal. No tachypnea, accessory muscle usage or respiratory distress.      Breath sounds: Normal breath sounds.   Chest:      Chest wall: No tenderness.   Abdominal:      General: There is no distension. There are no  signs of injury.   Genitourinary:     Comments: Foley catheter in place draining clear yellow fluid.  Musculoskeletal:         General: Normal range of motion.      Cervical back: Normal range of motion and neck supple.      Right lower leg: No edema.      Left lower leg: No edema.   Skin:     General: Skin is warm.      Coloration: Skin is not pale.   Neurological:      General: No focal deficit present.      Mental Status: She is alert and oriented to person, place, and time.      GCS: GCS eye subscore is 4. GCS verbal subscore is 5. GCS motor subscore is 6.      Cranial Nerves: No facial asymmetry.      Motor: Motor function is intact.      Coordination: Coordination is intact.   Psychiatric:         Attention and Perception: Attention and perception normal.         Mood and Affect: Mood and affect normal.           PAST HISTORY    Primary Care Provider: Malcolm Metro, MD    PMH/PSH:    Past Medical History:   Diagnosis Date   . Anemia    . Asthma    . Atrial fibrillation    . Chronic obstructive pulmonary disease    . Convulsions     . Depression    . Diabetes mellitus     diet controlled    . Hyperlipidemia    . Hypertension    . TIA (transient ischemic attack)       has a past surgical history that includes Hysterectomy; pci; Cardiac catheterization (2017); Triple Lumen Dialysis Cath (N/A, 12/28/2021); and Non-Tunneled Cath Placement [facility]) (N/A, 01/27/2022).    Social/Family History:    She   reports that she has never smoked. She has never used smokeless tobacco. She reports that she does not drink alcohol and does not use drugs.  Family History   Problem Relation Age of Onset   . Stroke Maternal Grandmother        Listed Medications on Arrival:    Home Medications               acetaminophen (TYLENOL) 325 MG tablet     Take 2 tablets (650 mg total) by mouth every 4 (four) hours as needed for Pain     albuterol sulfate HFA (PROVENTIL) 108 (90 Base) MCG/ACT inhaler     Inhale 2 puffs into the lungs every 6 (six) hours as needed for Wheezing     apixaban (ELIQUIS) 5 MG     Take 1 tablet (5 mg total) by mouth every 12 (twelve) hours     budesonide-formoterol (SYMBICORT) 80-4.5 MCG/ACT inhaler     Inhale 2 puffs into the lungs daily     calcium carbonate (TUMS) 500 MG chewable tablet     Chew 2 tablets (1,000 mg) by mouth every 6 (six) hours as needed for Heartburn     diazePAM (VALIUM) 2 MG tablet     Take 1 tablet (2 mg) by mouth every 8 (eight) hours     diphenhydrAMINE (BENADRYL) 25 mg capsule     Take 1 capsule (25 mg) by mouth every 6 (six) hours as needed for Itching  ferrous sulfate 324 (65 FE) MG Tablet Delayed Response     Take 1 tablet (324 mg) by mouth every other day     HYDROmorphone (DILAUDID) 2 MG tablet     Take 1 tablet (2 mg) by mouth every 6 (six) hours as needed (severe pain)     lurasidone (LATUDA) 120 MG Tab     Take 1 tablet (120 mg) by mouth every evening     metoprolol succinate XL (TOPROL-XL) 25 MG 24 hr tablet     Take 1.5 tablets (37.5 mg) by mouth every 12 (twelve) hours     midodrine (PROAMATINE) 10 MG tablet      Take 1 tablet (10 mg) by mouth 2 (two) times daily with meals At 8am and 12pm     naloxone (NARCAN) 4 MG/0.1ML nasal spray     1 spray intranasally. If pt does not respond or relapses into respiratory depression call 911. Give additional doses every 2-3 min.     pantoprazole (PROTONIX) 40 MG tablet     Take 1 tablet (40 mg total) by mouth daily     polyethylene glycol (MIRALAX) 17 g packet     Take 17 g by mouth daily     rOPINIRole (REQUIP) 1 MG tablet     Take 1 tablet (1 mg total) by mouth nightly     senna-docusate (PERICOLACE) 8.6-50 MG per tablet     Take 2 tablets by mouth nightly     tamsulosin (FLOMAX) 0.4 MG Cap     Take 1 capsule (0.4 mg) by mouth Daily after dinner     thiamine (B-1) 100 MG tablet     Take 1 tablet (100 mg) by mouth daily     vitamin B-6 (B-6) 50 MG tablet     Take 1 tablet (50 mg) by mouth daily     vitamin D (vitamin D3) 25 MCG (1000 UT) tablet     Take 1 tablet (25 mcg) by mouth daily     vitamins/minerals Tab     Take 1 tablet by mouth daily     zinc sulfate (ZINCATE) 220 (50 Zn) MG capsule     Take 1 capsule (220 mg) by mouth daily             Allergies: She is allergic to singulair [montelukast], immune globulin (human), myoview [technetium-60m], atorvastatin, contrast [iodinated contrast media], latex, magnesium chloride, nitroglycerin, penicillins, and tetracyclines & related.         VISIT INFORMATION        Assessment/Plan:      Shawnay Bramel is a 66 y.o. female with the PMHx above presenting with several hours of chest pain that is not exertional in nature that starts in the center of her chest and radiates to the left upper extremity as well as the left side of her neck.  Vital signs stable.  Physical exam unremarkable and reassuring.  Differential diagnosis includes but is not limited to ACS, pneumonia, upper respiratory tract infection, atypical chest pain, MSK related pain, electrolyte derangement, anemia. ordering CBC, BMP, EKG, troponin, chest x-ray, UA.  Provided aspirin  and Tylenol.          Reassessments/Clinical Course:      ED Course as of 03/05/22 0618   Sat Mar 05, 2022   0502 Work-up thus far demonstrating baseline EKG as well as negative troponin.  No overt leukocytosis or anemia.  Patient remained stable with no changes in behavior or vital signs.  Patient states that pain is improved.  Will repeat troponin and reassess. [AC]   [id number] Reevaluated - patient states that her pain is much better. When asked to rate her pain she says "7/10". Redosing pain meds. Will reeval pain and rpt trop. [AC]      ED Course User Index  [AC] Bevely Palmer, DO         Procedures        Lab Results:  Results       ** No results found for the last 24 hours. **            Imaging Results:  Chest AP Portable   Final Result          1.No acute abnormality. There is no significant change from the prior exam.   2. There is no definite evidence of pneumonia or CHF.         Miguel Dibble, MD   03/05/2022 2:20 AM          EKG Report:      Medications Given in the ED:    ED Medication Orders (From admission, onward)      Start Ordered     Status Ordering Provider    03/05/22 0249 03/05/22 0248  acetaminophen (TYLENOL) tablet 1,000 mg  Once        Route: Oral  Ordered Dose: 1,000 mg       Ordered Tashia Leiterman Alm Bustard, DO  Resident  03/05/22 0253       Bevely Palmer, DO  Resident  03/05/22 [phone]

## 2022-03-05 NOTE — EDIE (Signed)
COLLECTIVE?NOTIFICATION?03/05/2022 00:36?Burbage, Bellatrix?MRN: [id number]    Criteria Met      3 Different Facilities in 90 Days    5 ED Visits in 12 Months    Security and Safety  No Security Events were found.  ED Care Guidelines  There are currently no ED Care Guidelines for this patient. Please check your facility's medical records system.    Flags      Negative COVID-19 Lab Result - [facility] - A specimen collected from this patient was negative for COVID-19 / Attributed By: [place] Department of Health / Attributed On: 02/26/2022       Prescription Monitoring Program  310??- Narcotic Use Score  270??- Sedative Use Score  000??- Stimulant Use Score  340??- Overdose Risk Score  - All Scores range from 000-999 with 75% of the population scoring < 200 and on 1% scoring above 650  - The last digit of the narcotic, sedative, and stimulant score indicates the number of active prescriptions of that type  - Higher Use scores correlate with increased prescribers, pharmacies, mg equiv, and overlapping prescriptions  - Higher Overdose Risk Scores correlate with increased risk of unintentional overdose death   Concerning or unexpectedly high scores should prompt a review of the PMP record; this does not constitute checking PMP for prescribing purposes.    E.D. Visit Count (12 mo.)  Facility Visits   [place] [facility] 3   [place] [facility] 1   [facility] 12   [facility] 13   [facility] 2   Total 31   Note: Visits indicate total known visits.     Recent Emergency Department Visit Summary  Showing 10 most recent visits out of 31 in the past 12 months   Date Facility [facility] Type Diagnoses or Chief Complaint    Mar 05, 2022  [place] [facility]  [place]  [place]  Emergency      Chest Pain      Feb 28, 2022  [facility]  [name]  [place]  Emergency      ALTERED, SEPSIS      Encephalopathy, unspecified      Feb 28, 2022  [facility] [place]  [name]  [place]  Emergency      RS/POSS INFECTION      Urinary tract infection, site not specified      Other urogenital candidiasis      Hematuria, unspecified      Infection and inflammatory reaction due to indwelling urethral catheter, initial encounter      Feb 26, 2022  [facility] [place]  [name]  [place]  Emergency      RS/ BLOOD IN CATHETER      Unspecified complication of genitourinary prosthetic device, implant and graft, initial encounter      1. Hematuria, unspecified      1. Pelvic and perineal pain      3. Unspecified atrial fibrillation      4. Type 2 diabetes mellitus without complications      5. Hyperlipidemia, unspecified      6. Essential (primary) hypertension      7. Chronic obstructive pulmonary disease, unspecified      8. Gastro-esophageal reflux disease without esophagitis      Feb 24, 2022  [facility]  [place]  [place]  Emergency      CP  Chest Pain      Chest pain, unspecified      Feb 09, 2022  [facility]  [place]  [place]  Emergency      CP      Chest Pain      Chest pain, unspecified      Jan 26, 2022  [place] [facility]  [place]  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  [place]  [place]  Emergency      lower extremity weakness      Other symptoms and signs involving the musculoskeletal system      Dec 24, 2021  [place] [facility]  [name]  [place]  Emergency      Numbess; Drooling      Chest Pain      Numbness      Headache      Other symptoms and signs involving the musculoskeletal system      Dec 23, 2021  [facility]  [name]  [place]  Emergency      RS/ WEAKNESS      Weakness      1. Urinary tract infection, site not specified      2. Nausea      2. Other specified soft tissue disorders      3. Chest pain, unspecified      3. Dysuria      5. Tachycardia, unspecified      7. Contact with and (suspected) exposure to COVID-19      8. Paroxysmal atrial fibrillation        Recent  Inpatient Visit Summary  Date Facility [facility] Type Diagnoses or Chief Complaint    Feb 28, 2022  [facility]  [name]  [place]  General Medicine      Encephalopathy, unspecified      Anemia, unspecified      Facial weakness      Jan 26, 2022  [place] [facility]  [place]  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  [place]  [place]  Neurology      Other symptoms and signs involving the musculoskeletal system      Guillain-Barre syndrome      Nov 30, 2021  [facility] [place]  [name]  [place]  Family Practice      Constipation, unspecified      1. Nausea with vomiting, unspecified      1. Hypokalemia      2. Cerebral infarction, unspecified      2. Unspecified abdominal pain      3. Shortness of breath      3. Hemiplegia and hemiparesis following cerebral infarction affecting left non-dominant side      4. Rhabdomyolysis      5. Other disorders of phosphorus metabolism      6. Epilepsy, unspecified, not intractable, without status epilepticus      Aug 24, 2021  [facility] [place]  [name]    Family Practice      Acute renal failure (ARF) (HCC)      1. Acute kidney failure, unspecified      2. Other chest pain  2. Hemiplegia and hemiparesis following cerebral infarction affecting left non-dominant side      3. Pain in left shoulder      3. Hypotension, unspecified      4. Nausea      4. Thoracic aortic aneurysm, without rupture, unspecified      5. Type 2 diabetes mellitus without complications      6. Epilepsy, unspecified, not intractable, without status epilepticus      Jun 29, 2021  [facility] [place]  [name]  [place]  General Medicine      Chest pain, unspecified type      Apr 21, 2021  [facility] [place]  [name]  [place]  Family Practice      HEADACHE      LEFT SIDED WEAKNESS      Left sided numbness      Facial paresthesia      Left-sided weakness      Mar 11, 2021  [facility] [place]  [name]  [place]  General Medicine       FACIAL DROOP      1. Facial weakness      2. Hemiplegia and hemiparesis following cerebral infarction affecting left non-dominant side      3. Paroxysmal atrial fibrillation      4. Long term (current) use of anticoagulants      5. Pure hypercholesterolemia, unspecified      6. Obesity, unspecified      7. Body mass index [BMI] 37.0-37.9, adult      8. Chronic obstructive pulmonary disease, unspecified      9. Gastro-esophageal reflux disease without esophagitis        Care Team  Provider Specialty Phone Fax Service Dates   Kiamesha Lake, AMR , DO Internal Medicine   Current    Santa Lighter, M.D. Internal Medicine   Current    Lorenda Ishihara MD Ethelene Browns, MD Psychiatry and Neurology: Geriatric Psychiatry [phone]  Current    [name] , M.D. Family Medicine [phone]  Current      Collective Portal  This patient has registered at the [facility] Emergency Department   For more information visit: [email or web address]     PLEASE NOTE:     1.   Any care recommendations and other clinical information are provided as guidelines or for historical purposes only, and providers should exercise their own clinical judgment when providing care.    2.   You may only use this information for purposes of treatment, payment or health care operations activities, and subject to the limitations of applicable Collective Policies.    3.   You should consult directly with the organization that provided a care guideline or other clinical history with any questions about additional information or accuracy or completeness of information provided.    ? 2023 [place], [employer]. - [email or web address]

## 2022-03-31 ENCOUNTER — Ambulatory Visit: Payer: 59 | Admitting: Neurology

## 2022-04-08 ENCOUNTER — Ambulatory Visit: Payer: Medicaid Other | Admitting: Neurology

## 2022-04-08 NOTE — Progress Notes (Deleted)
[facility]  MULTIPLE SCLEROSIS & NEUROIMMUNOLOGY CENTER  Main Line / Scheduling / on-call: [phone]  Clinic Tel: [phone]  Fax: [phone]  Send [place] a mychart or inbasket message for the fastest response  ~~~~~~~~~~~~~~~~~~~~~~~~~~~~~~~~~~~~~~~~~~~~~~~~~~~~~~~~~~~~~~~~~~~~~~~~~~~~~~~~~~~~    {rdvisit:47116::"Precharting notes"}    CC: Stiff Person Evaluation    HPI:   History was obtained from records review, patient,     66 y.o. year-old female     08/22/18 to 08/28/18: admitted for dysarthria, left weakness. MRI showed no acute stroke, nonspecfific FLAIR signal changes. Found to have A-fib. Hx seizures    01/06/19 to 01/09/19: admission for chest pain    05/11/19-06/17/19: admitted for left sided weakness. She was on eliquis.     12/25/21: admitted for weakness. 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 Rec'd PLEX. Allergy to IVIG.     PMH: seizures, white matter lesions    EXAM  Visit Vitals  LMP  (LMP Unknown)     General:   Fundoscopic:   Psychiatric.   Mental Status: The patient was awake, alert, appropriate  Cranial Nerves: CN II-XII   Motor:   Reflex:   Sensation:   Coordination:   Gait:       Imaging   MRI b/l femurs: changes c/w myositsi  MRI brain 2020 to2023: mostly vascular changes, no clear demyelinating lesions  MRI CTL 12/25/21- No clear cord lesions. Signfiicant DJD ; perhaps some cervical cord compression      Testing       Labs  Lab Results   Component Value Date    HGBA1C 5.3 12/27/2021    GLU 81 03/05/2022    EGFR >60.0 03/05/2022     Lab Results   Component Value Date    B12 442 12/25/2021    VITD 10 (L) 12/28/2021    CREAT 0.5 03/05/2022    WBC 5.75 03/05/2022      CSF protein 52, glc 59, w1, r0. 40% lymph, 60% macs    Labs: ENS2 shows detectable low-titer GAD65 0.19 which is commonly seen in normal individuals and has a low PPV for neurologic autoimmunity    Vit D low    Component      Latest  Ref Rng 06/17/2019 12/24/2021 12/25/2021 12/28/2021 12/30/2021 01/01/2022   Encephalopathy Interpretation    SEE BELOW      AMPA Receptor Antibody CBA      Negative     Negative      Amphiphysin Antibody IFA      Negative     Negative      Amphiphysin Antibody IFA      Negative     Negative      AGNA-1 Antibody IFA      Negative     Negative      AGNA-1 Antibody IFA      Negative     Negative      ANNA-1 (HU) Antibody IFA      Negative     Negative      ANNA-1 (HU) Antibody IFA      Negative     Negative      ANNA-2 (RI) Antibody IFA      Negative     Negative      ANNA-2 (RI) Antibody IFA      Negative     Negative      ANNA-3 Antibody IFA      Negative  Negative      ANNA-3 Antibody IFA      Negative     Negative      CASPR2 IgG CBA      Negative     Negative      CRMP-5, IgG      Negative     Negative      CRMP-5, IgG      Negative     Negative      DPPX Antibody IFA      Negative     Negative      GABA-B Receptor Antibody CBA      Negative     Negative      GAD65 Antibody Assay      <=0.02 nmol/L    0.19 (H)      GFAP IFA      Negative     Negative      IgLON5 IFA      Negative     Negative      LGI1 IgG CBA      Negative     Negative      mGLuR1 Antibody IFA      Negative     Negative      NIF IFA      Negative     Negative      NMDA Receptor Antibody CBA      Negative     Negative      PCA-1 (YO) Antibody IFA      Negative     Negative      PCA-1 (YO) Antibody IFA      Negative     Negative      PCA-2 Antibody IFA      Negative     Negative      PCA-2 Antibody IFA      Negative     Negative      PCA-Tr (DNER) Antibody IFA      Negative     Negative      PCA-Tr (DNER) Antibody IFA      Negative     Negative      IFA Notes    None.      Neurochondrin IFA      Negative     Negative      Septin-7 IFA      Negative     Negative      Paraneoplastic Eval Interp    SEE BELOW      Calcium Channel, Ab P/Q-Type      <=0.02 nmol/L    0.00      VGKC Antibody      <=0.02 nmol/L    0.00      JO-1 Antibody      <11 SI    <11       PL-7 Antibody      <11 SI    <11      PL-12 Antibody      <11 SI    <11      EJ Antibody      <11 SI    <11      OJ Antibody      <11 SI    <11      SRP Antibody      <11 SI    <11      Mi-2 Alpha Ab      <11 SI    <11      Mi-2 Beta Antibody      <  11 SI    <11      MDA-5 Antibody      <11 SI    <11      TIF-1y Antibody      <11 SI    <11      NXP-2 Antibody      <11 SI    <11      GM 1 IgG      <1:800 titer   <1:800       GD1B AB (IgM)      < OR = 1:800 titer   <1:800       GQ1B Ab (IgG)      LESS THAN 1:100 titer   <1:100       ASIALO-GM-1 Ab (IgM)      < OR = 1:1600 titer   <1:1,600       GD1A AB (IGG)      <1:100 titer   <1:100       GD1A AB (IGM)      <1:800 titer   <1:800       GD1B AB (IgG)      <1:100 titer   <1:100       ASIALO-GM-1 AB(IGG)      <1:100 titer   <1:100       GM-1 AB(IGM)      <1:800 titer   <1:800       IgG Monos. GM1      Negative     Negative      IgM Monos. GM1      Negative     Negative      IgG Asialo. GM1      Negative     Negative      IgM Asialo. GM1      Negative     Negative      IgG Disialo. GD1b      Negative     Negative      IgM Disialo. GD1b      Negative     Negative      Hepatitis B Surface AG      Non Reactive       Non-Reactive    Hep A IgM      Non Reactive       Nonreactive    Hepatitis B Core IgM      Non Reactive       Nonreactive    Hepatitis C, AB      Non Reactive       Non-Reactive    HSV 1 DNA, QL PCR, CSF      Not Detected   Not Detected        HSV 2 DNA, QL PCR, CSF      Not Detected   Not Detected        Myasthenia Gravis with MuSK Interpretation    SEE BELOW      ACH Receptor Binding AB      <=0.02 nmol/L    0.00      Vitamin B-12      211 - 911 pg/mL 494   442       ANA Screen      Negative    Negative       C-Reactive Protein      0.0 - 1.1 mg/dL   2.7 (H)       Vitamin B6      2.1 - 21.7 ng/mL   <2.0 (L)  Zinc      60 - 130 mcg/dL   48 (L)       HIV Ag/Ab, 4th Generation      Non-Reactive    Non-Reactive       Whole Blood Vitamin B1      78 - 185  nmol/L   35 (L)       Vitamin D 25-OH Total      30 - 100 ng/mL    10 (L)      Aldolase      <=8.1 U/L     6.5        Legend:  (H) High  (L) Low      ASSESSMENT / PLAN  RTC: No follow-ups on file.    No diagnosis found.    66y/o woman with hx A-fib with cerebrovascular disease who epxerienced recurrent admission for muscle weakness. 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     PLAN  - repeat LP  - myositis panel  - continue eliquis & statin for stroke prevention      Orders today (details below)  No orders of the defined types were placed in this encounter.    No orders of the defined types were placed in this encounter.      I spent *** minutes reviewing the records, talking with the patient and developing their care plan. This excludes time for separately billed procedures.      Lora Havens, MD PHD  Director, Neuroimmunology & MS Center  [facility]    [address], [place], [place] [address]  Main line / after-hours on-call: [phone]      For direct access, contact Allayne Stack RN  Tel [phone]   Fax [phone]   ===================================================================  NFL (neurofilament light chain) Insurance Justification  Serum / plasma Neurofilament light chain is a well-established biomarker for MS relapses, and potentially for non-relapsing progressive disease as well (it is sensitive but not necessarily specific). It also has established utility in other CNS and peripheral nerve degenerative conditions. Recently, testing has become commercially available.  This data has emerged from multiple independent groups -- eg, [employer] working groups in [place] & [place]; [place], [place], and [place] reference laboratories; [employer]; and  studies of MS cohorts at [employer]; and clinical trials for various MS therapies. NFL can improve upon the sensitivity of MRI (current standard of care) for detecting disease activity/progression. Further, most MS therapies have been shown to decrease sNFL levels, with higher efficacy therapies demonstrating greater reduction than lower efficacy therapies. Overall, when sNFL values are interpreted in context of patient's clinical situation, they are likely to impact management and treatment decisions.     PATIENT INSTRUCTIONS PROVIDED  Patient was given an "After Visit Summary" with a copy of the testing orders, medications and the following other instructions:  There are no Patient Instructions on file for this visit.    IMPORTED INFORMATION FROM MEDICAL RECORDS  No diagnosis found.  No orders of the defined types were placed in this encounter.

## 2022-06-13 ENCOUNTER — Ambulatory Visit: Payer: 59 | Admitting: Neurology

## 2022-06-23 ENCOUNTER — Ambulatory Visit: Payer: 59 | Admitting: Neurology

## 2022-08-12 ENCOUNTER — Ambulatory Visit: Payer: 59 | Attending: Neurology | Admitting: Neurology

## 2022-08-12 ENCOUNTER — Encounter: Payer: Self-pay | Admitting: Neurology

## 2022-08-12 VITALS — BP 104/68 | HR 85 | Temp 98.5°F | Resp 16 | Ht 65.0 in | Wt 150.3 lb

## 2022-08-12 DIAGNOSIS — R269 Unspecified abnormalities of gait and mobility: Secondary | ICD-10-CM | POA: Insufficient documentation

## 2022-08-12 DIAGNOSIS — R278 Other lack of coordination: Secondary | ICD-10-CM | POA: Insufficient documentation

## 2022-08-12 DIAGNOSIS — R634 Abnormal weight loss: Secondary | ICD-10-CM | POA: Insufficient documentation

## 2022-08-12 DIAGNOSIS — G629 Polyneuropathy, unspecified: Secondary | ICD-10-CM | POA: Insufficient documentation

## 2022-08-12 NOTE — Patient Instructions (Signed)
IMAGING  All MRI & CT imaging is to be done at [facility] ([facility]). Call [phone] to schedule  Ask for the 3-Tesla machine  They offer "wide bore" (new) machines which are larger and faster than the older machines, but not at all locations    For insurance approval, reach out to them first. If there is a denial, let us know ASAP as we can help.       Madissen Wyse, MD PHD  Director, Neuroimmunology & MS Center  [facility]  [employer] [facility]    [address], [address], [place], [place] [address]  Main line / after-hours on-call: [phone]      For direct access, contact Cheryl Robson RN  Tel [phone]   Fax [phone]

## 2022-08-12 NOTE — Progress Notes (Unsigned)
[facility]  MULTIPLE SCLEROSIS & [facility]UROIMMUNOLOGY CENTER  Main Line / Scheduling / on-call: [phone]  Clinic Tel: [phone]  Fax: [phone]  Send [place] a mychart or inbasket message for the fastest response  ~~~~~~~~~~~~~~~~~~~~~~~~~~~~~~~~~~~~~~~~~~~~~~~~~~~~~~~~~~~~~~~~~~~~~~~~~~~~~~~~~~~~    IN-PERSON    CC: "stiff person"    HPI:   History was obtained from records review, patient,     66 y.o. year-old female with hx A-fib, TIA's. 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     Syndrome started Dec 2022 with leg /foot discomfort  She had rhabdo in Jan 2023  By February, reported "jello legs", inabiltiy to walk, using a walker  She can walk forever with a walker.    She has insensate bladder, cathether dependent. Attributes to ozempic side effect.     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 As below, this was a mis diagnosis.     Treatments:  Ig 12/24/21 - allergic reaction  PLEX early 01/2022 - no true response. Felt better for 1 day.     EXAM  Visit Vitals  BP 104/68 (BP Site: Left arm, Patient Position: Sitting, Cuff Size: Medium)   Pulse 85   Temp 98.5 F (36.9 C) (Oral)   Resp 16   Ht 1.651 m (5\' 5" )   Wt 68.2 kg (150 lb 4.8 oz)   LMP  (LMP Unknown)   SpO2 97%   BMI 25.01 kg/m     General:   Fundoscopic:   Psychiatric.   Mental Status: The patient was awake, alert, appropriate  Cranial Nerves: CN II-XII wnl  Motor: 5/5, normal tone  Reflex: reduce throughout. Down toes.   Sensation: markedly reduced pinprick to mid-shin and both forearms. Proprioceptive deficit LUE worse than LUE  Coordination:   Gait: pos romberg.       Imaging   My review:  Vascular brain changes  I suspect there may be an anterior C-spine cord signal change; T-spine limited by motion artifact and not interpretable    MRI thighs were read as b/l muscle edema: myositis?      Reports:  MRI Brain W WO Contrast    Result Date: 02/04/2022  Impression:  1. No acute intracranial finding. 2. Mild  global volume loss and chronic small vessel ischemic changes. 3. Prominent cerebellar volume loss. 4. 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 Gaylyn Rong, MD 02/04/2022 7:54 AM    MRI Thoracic Spine WO Contrast    Result Date: 12/25/2021  Impression:  No cord compression or intrinsic cord signal abnormality. Minimal degenerative changes as described above. Alric Seton MD, MD  12/25/2021 2:56 AM    MRI Lumbar Spine WO Contrast    Result Date: 12/25/2021  Impression:  1. 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 Orson Gear, MD  12/25/2021 2:54 AM    MRI Cervical Spine WO Contrast    Result Date: 12/25/2021  Impression:  No cord compression or intrinsic cord signal abnormality. Multilevel degenerative changes as described above, with varying degrees of neural foraminal narrowing, and no central canal stenosis. Alric Seton MD, MD  12/25/2021 2:53 AM    MRI Brain WO Contrast    Result Date: 12/25/2021  Impression:  No acute intracranial abnormality. Mild chronic microvascular ischemic changes. Cavum septum pellucidum and cavum vergae. Alric Seton MD, MD  12/25/2021 2:47 AM    CT Head  WO Contrast    Result Date: 12/24/2021  Impression:   No acute intracranial process. Sandie Ano, MD  12/24/2021 8:17 PM       Testing       Labs  Lab Results   Component Value Date    VITB6 <2.0 (L) 12/25/2021    HGBA1C 5.3 12/27/2021    EGFR >60.0 03/05/2022    CREAT 0.5 03/05/2022    WBC 5.75 03/05/2022    IGA 318 12/28/2021    B12 442 12/25/2021    VITD 10 (L) 12/28/2021     CSF glc 59, prot 42, r0, w1. ENC2 or GAD65 never sent. OCB never sent.    Serum GAD65 0.19 -> nonspecific. At value <2.0 nM unlikely to cause neurologic idsease. It is probably a consequence of IVIG given [facility] panel sent on 3/7th      ASSESSMENT / PLAN  RTC: Return for [place]. 1 week after EMG/NCS.    1. Sensory neuropathy    2. Neurologic gait dysfunction    3. Unexplained weight loss     4. Sensory ataxia      She was mis-diagnosed with Stiff Person syndrome. Her syndrome, exam, findings are fundamentally inconsistent with SPS. She will require re-evaluation of her diagnosis.    66y/o F with progressive, severe LE>UE sensory loss and neuropathy causing her to be walker-dependent. Her syndrome is inconsistent with stiff person syndrome. She has hx of DM but with A1c 5.3, insufficient to explain degree of neuropathy.     Stiff person is characterized by hypereflexia and increased muscle tone. Numerous neuro exam document the opposite. Her syndrome is neuropathy, not a myelopathy. She might have a dorsal column myelopathy vs peripheral neuropathy. CPK's have been normal, so myopathy or myositis is less likely.     PLAN  MRI C& T spine to evaluate for anterior cord signal  EMG/NCS LE's  Neuropathy labs  If no clear answer, would repeat LP and MRIbrain & L spine.     She reports having a PET scan done, will get results  CTA chest & CT abd/pelvis were relatively benign    Orders today (details below)  Orders Placed This Encounter   Procedures    MRI Cervical Spine W WO Contrast    MRI Thoracic Spine W WO Contrast    ANA IFA Reflex Titer and Pattern    Angiotensin Converting Enzyme    Cyclic citrul peptide antibody, IgG    CNS Demyelinating Disease Evaluation    Epstein-Barr virus VCA, IgM    Sedimentation rate (ESR)    Hepatitis B DNA Ultraquantitative, PCR    Hepatitis C (HCV) antibody, Total    Hepatitis B (HBV) Surface Antigen w/ Reflex to Confirmation    HIV-1/2 Ag/Ab 4th Gen. w/ Reflex    IgG, IgA, IgM    HTLV-I/-II Antibody Screen    Motor and Sensory Neuropathy Eval, S    Miscellaneous Lab Test    TSH    Vitamin B6    Varicella Zoster (VZV) antibody, IgM    Vitamin B12    Vitamin D,25 OH, Total    Zinc    Copper, serum    ANA IFA w reflex to Titer/Pattern/Antibody    Rheumatoid factor    T3, free    T4, free    Vitamin E    Quantiferon(R) - TB Gold Plus    C Reactive Protein    Cytomegalovirus  antibody, IgM    ANCA Panel for Vasculitis  EMG    Nerve conduction test     No orders of the defined types were placed in this encounter.      I spent 40 minutes reviewing the records, talking with the patient and developing their care plan. This excludes time for separately billed procedures.      Lora Havens, MD PHD  Director, Neuroimmunology & MS Center  [facility]    [address], [place], [place] [address]  Main line / after-hours on-call: [phone]      For direct access, contact Allayne Stack RN  Tel [phone]   Fax [phone]   ===================================================================  NFL (neurofilament light chain) Insurance Justification  Serum / plasma Neurofilament light chain is a well-established biomarker for MS relapses, and potentially for non-relapsing progressive disease as well (it is sensitive but not necessarily specific). It also has established utility in other CNS and peripheral nerve degenerative conditions. Recently, testing has become commercially available.  This data has emerged from multiple independent groups -- eg, NFL working groups in [place] & [place]; [place], [place], and [place] reference laboratories; [employer]; and studies of MS cohorts at [employer]; and clinical trials for various MS therapies. NFL can improve upon the sensitivity of MRI (current standard of care) for detecting disease activity/progression. Further, most MS therapies have been shown to decrease sNFL levels, with higher efficacy therapies demonstrating greater reduction than lower efficacy therapies. Overall, when sNFL values are interpreted in context of patient's clinical situation, they are likely to impact management and treatment decisions.     PATIENT INSTRUCTIONS PROVIDED  Patient was given an "After Visit Summary" with a copy of the testing orders, medications and the following other  instructions:  Patient Instructions   IMAGING  All MRI & CT imaging is to be done at [facility]). Call [phone] to schedule  Ask for the 3-Tesla machine  They offer "wide bore" (new) machines which are larger and faster than the older machines, but not at all locations    For insurance approval, reach out to them first. If there is a denial, let us know ASAP as we can help.       Lora Havens, MD PHD  Director, Neuroimmunology & MS Center  [facility]    [address], [place], [place] [address]  Main line / after-hours on-call: [phone]      For direct access, contact Allayne Stack RN  Tel [phone]   Fax [phone]       IMPORTED INFORMATION FROM MEDICAL RECORDS   Diagnosis ICD-10-CM Associated Order   1. Sensory neuropathy  G62.9 EMG     MRI Cervical Spine W WO Contrast     MRI Thoracic Spine W WO Contrast     Nerve conduction test     ANA IFA Reflex Titer and Pattern     Angiotensin Converting Enzyme     Cyclic citrul peptide antibody, IgG     CNS Demyelinating Disease Evaluation     Epstein-Barr virus VCA, IgM     Sedimentation rate (ESR)     Hepatitis B DNA Ultraquantitative, PCR     Hepatitis C (HCV) antibody, Total     Hepatitis B (HBV) Surface Antigen w/ Reflex to Confirmation     HIV-1/2 Ag/Ab 4th Gen. w/ Reflex     IgG, IgA, IgM  HTLV-I/-II Antibody Screen     Motor and Sensory Neuropathy Eval, S     Miscellaneous Lab Test     TSH     Vitamin B6     Varicella Zoster (VZV) antibody, IgM     Vitamin B12     Vitamin D,25 OH, Total     Zinc     Copper, serum     ANA IFA w reflex to Titer/Pattern/Antibody     Rheumatoid factor     T3, free     T4, free     Vitamin E     Quantiferon(R) - TB Gold Plus     C Reactive Protein     Cytomegalovirus antibody, IgM     ANCA Panel for Vasculitis      2. Neurologic gait dysfunction  R26.9 EMG     MRI Cervical Spine W WO Contrast     MRI Thoracic Spine W WO Contrast      Nerve conduction test     ANA IFA Reflex Titer and Pattern     Angiotensin Converting Enzyme     Cyclic citrul peptide antibody, IgG     CNS Demyelinating Disease Evaluation     Epstein-Barr virus VCA, IgM     Sedimentation rate (ESR)     Hepatitis B DNA Ultraquantitative, PCR     Hepatitis C (HCV) antibody, Total     Hepatitis B (HBV) Surface Antigen w/ Reflex to Confirmation     HIV-1/2 Ag/Ab 4th Gen. w/ Reflex     IgG, IgA, IgM     HTLV-I/-II Antibody Screen     Motor and Sensory Neuropathy Eval, S     Miscellaneous Lab Test     TSH     Vitamin B6     Varicella Zoster (VZV) antibody, IgM     Vitamin B12     Vitamin D,25 OH, Total     Zinc     Copper, serum     ANA IFA w reflex to Titer/Pattern/Antibody     Rheumatoid factor     T3, free     T4, free     Vitamin E     Quantiferon(R) - TB Gold Plus     C Reactive Protein     Cytomegalovirus antibody, IgM     ANCA Panel for Vasculitis      3. Unexplained weight loss  R63.4 EMG     MRI Cervical Spine W WO Contrast     MRI Thoracic Spine W WO Contrast     Nerve conduction test     ANA IFA Reflex Titer and Pattern     Angiotensin Converting Enzyme     Cyclic citrul peptide antibody, IgG     CNS Demyelinating Disease Evaluation     Epstein-Barr virus VCA, IgM     Sedimentation rate (ESR)     Hepatitis B DNA Ultraquantitative, PCR     Hepatitis C (HCV) antibody, Total     Hepatitis B (HBV) Surface Antigen w/ Reflex to Confirmation     HIV-1/2 Ag/Ab 4th Gen. w/ Reflex     IgG, IgA, IgM     HTLV-I/-II Antibody Screen     Motor and Sensory Neuropathy Eval, S     Miscellaneous Lab Test     TSH     Vitamin B6     Varicella Zoster (VZV) antibody, IgM     Vitamin B12     Vitamin D,25 OH, Total     Zinc  Copper, serum     ANA IFA w reflex to Titer/Pattern/Antibody     Rheumatoid factor     T3, free     T4, free     Vitamin E     Quantiferon(R) - TB Gold Plus     C Reactive Protein     Cytomegalovirus antibody, IgM     ANCA Panel for Vasculitis      4. Sensory ataxia   R27.8 EMG     MRI Cervical Spine W WO Contrast     MRI Thoracic Spine W WO Contrast     Nerve conduction test     ANA IFA Reflex Titer and Pattern     Angiotensin Converting Enzyme     Cyclic citrul peptide antibody, IgG     CNS Demyelinating Disease Evaluation     Epstein-Barr virus VCA, IgM     Sedimentation rate (ESR)     Hepatitis B DNA Ultraquantitative, PCR     Hepatitis C (HCV) antibody, Total     Hepatitis B (HBV) Surface Antigen w/ Reflex to Confirmation     HIV-1/2 Ag/Ab 4th Gen. w/ Reflex     IgG, IgA, IgM     HTLV-I/-II Antibody Screen     Motor and Sensory Neuropathy Eval, S     Miscellaneous Lab Test     TSH     Vitamin B6     Varicella Zoster (VZV) antibody, IgM     Vitamin B12     Vitamin D,25 OH, Total     Zinc     Copper, serum     ANA IFA w reflex to Titer/Pattern/Antibody     Rheumatoid factor     T3, free     T4, free     Vitamin E     Quantiferon(R) - TB Gold Plus     C Reactive Protein     Cytomegalovirus antibody, IgM     ANCA Panel for Vasculitis        Orders Placed This Encounter   Procedures    MRI Cervical Spine W WO Contrast     Standing Status:   Future     Standing Expiration Date:   08/13/2023     Scheduling Instructions:      [facility] ([email or web address] [phone]), or [facility] or [place] only. Do not schedule at any other [place] location..     Order Specific Question:   Does the patient have a pacemaker or defibrillator?     Answer:   No     Order Specific Question:   What is the patient's sedation requirement?     Answer:   No Sedation     Order Specific Question:   Clinical info for radiologist     Answer:   MS eval. compare to , [facility], [facility].     Order Specific Question:   Release to patient     Answer:   Immediate    MRI Thoracic Spine W WO Contrast     Standing Status:   Future     Standing Expiration Date:   08/13/2023     Scheduling Instructions:      [facility]  ([email or web address] [phone])     Order Specific Question:   Does the patient have a pacemaker or defibrillator?     Answer:   No     Order Specific Question:   What is the patient's sedation requirement?  Answer:   No Sedation     Order Specific Question:   Clinical info for radiologist     Answer:   MS eval. compare to [place], [facility], [facility].     Order Specific Question:   Release to patient     Answer:   Immediate    ANA IFA Reflex Titer and Pattern     Standing Status:   Future     Standing Expiration Date:   08/13/2023     Order Specific Question:   Release to patient     Answer:   Immediate    Angiotensin Converting Enzyme     Standing Status:   Future     Standing Expiration Date:   08/13/2023     Order Specific Question:   Release to patient     Answer:   Immediate    Cyclic citrul peptide antibody, IgG     Standing Status:   Future     Standing Expiration Date:   08/13/2023     Order Specific Question:   Release to patient     Answer:   Immediate    CNS Demyelinating Disease Evaluation     Standing Status:   Future     Standing Expiration Date:   08/13/2023     Order Specific Question:   Release to patient     Answer:   Immediate    Epstein-Barr virus VCA, IgM     Standing Status:   Future     Standing Expiration Date:   08/13/2023     Order Specific Question:   Release to patient     Answer:   Immediate    Sedimentation rate (ESR)     Standing Status:   Future     Standing Expiration Date:   08/13/2023     Order Specific Question:   Release to patient     Answer:   Immediate    Hepatitis B DNA Ultraquantitative, PCR     Standing Status:   Future     Standing Expiration Date:   08/13/2023     Order Specific Question:   Release to patient     Answer:   Immediate    Hepatitis C (HCV) antibody, Total     Standing Status:   Future     Standing Expiration Date:   08/13/2023     Order Specific Question:   Release to patient     Answer:   Immediate    Hepatitis B (HBV) Surface Antigen w/  Reflex to Confirmation     Standing Status:   Future     Standing Expiration Date:   08/13/2023     Order Specific Question:   Release to patient     Answer:   Immediate    HIV-1/2 Ag/Ab 4th Gen. w/ Reflex     Standing Status:   Future     Standing Expiration Date:   08/13/2023     Order Specific Question:   Release to patient     Answer:   Immediate    IgG, IgA, IgM     Standing Status:   Future     Standing Expiration Date:   08/13/2023     Order Specific Question:   Release to patient     Answer:   Immediate    HTLV-I/-II Antibody Screen     Standing Status:   Future     Number of Occurrences:   1     Standing Expiration Date:  08/13/2023     Order Specific Question:   Release to patient     Answer:   Immediate    Motor and Sensory Neuropathy Eval, S     Standing Status:   Future     Standing Expiration Date:   08/13/2023     Order Specific Question:   Release to patient     Answer:   Immediate    Miscellaneous Lab Test     Stability: Refrigerated 7 days; frozen 90 days     Standing Status:   Future     Standing Expiration Date:   08/13/2023     Order Specific Question:   Enter miscellaneous lab test:     Answer:   [facility] Send Out, Code NFLC, Lavender top (EDTA), 1.5 ml.. Centrifuge and aliquot plasma into plastic vial.    TSH     Standing Status:   Future     Standing Expiration Date:   08/13/2023     Order Specific Question:   Release to patient     Answer:   Immediate    Vitamin B6     Standing Status:   Future     Standing Expiration Date:   08/13/2023     Order Specific Question:   Release to patient     Answer:   Immediate    Varicella Zoster (VZV) antibody, IgM     Standing Status:   Future     Standing Expiration Date:   08/13/2023     Order Specific Question:   Release to patient     Answer:   Immediate    Vitamin B12     Standing Status:   Future     Standing Expiration Date:   08/13/2023     Order Specific Question:   Release to patient     Answer:   Immediate    Vitamin D,25 OH, Total     Standing  Status:   Future     Standing Expiration Date:   08/13/2023     Order Specific Question:   Release to patient     Answer:   Immediate    Zinc     Standing Status:   Future     Standing Expiration Date:   08/13/2023     Order Specific Question:   Release to patient     Answer:   Immediate    Copper, serum     Standing Status:   Future     Standing Expiration Date:   08/13/2023     Order Specific Question:   Release to patient     Answer:   Immediate    ANA IFA w reflex to Titer/Pattern/Antibody     Standing Status:   Future     Standing Expiration Date:   08/13/2023     Order Specific Question:   Release to patient     Answer:   Immediate    Rheumatoid factor     Standing Status:   Future     Standing Expiration Date:   08/13/2023     Order Specific Question:   Release to patient     Answer:   Immediate    T3, free     Standing Status:   Future     Standing Expiration Date:   08/13/2023     Order Specific Question:   Release to patient     Answer:   Immediate    T4, free     Standing  Status:   Future     Standing Expiration Date:   08/13/2023     Order Specific Question:   Release to patient     Answer:   Immediate    Vitamin E     Standing Status:   Future     Standing Expiration Date:   08/13/2023     Order Specific Question:   Release to patient     Answer:   Immediate    Quantiferon(R) - TB Gold Plus     Standing Status:   Future     Standing Expiration Date:   08/13/2023     Order Specific Question:   Release to patient     Answer:   Immediate    C Reactive Protein     Standing Status:   Future     Standing Expiration Date:   08/13/2023     Order Specific Question:   Release to patient     Answer:   Immediate    Cytomegalovirus antibody, IgM     Standing Status:   Future     Standing Expiration Date:   08/13/2023     Order Specific Question:   Release to patient     Answer:   Immediate    ANCA Panel for Vasculitis     Standing Status:   Future     Standing Expiration Date:   08/13/2023     Order Specific  Question:   Release to patient     Answer:   Immediate    EMG     Standing Status:   Future     Standing Expiration Date:   08/13/2023     Order Specific Question:   Reason for Exam     Answer:   both legs    Nerve conduction test     Standing Status:   Future     Standing Expiration Date:   08/13/2023     Order Specific Question:   Reason for Exam     Answer:   both legs

## 2022-08-23 NOTE — ED Provider Notes (Signed)
[facility]  [facility] EMERGENCY DEPARTMENT  [address]  [place] [address]  Dept: [phone]  Loc Appt: [phone]  Loc: [phone]            MDM / DDX / ED Course   66 y.o. female with past medical history of A-fib, hyperlipidemia, hypertension, diabetes, bipolar disorder, seizure, CVA, stiff person syndrome presents ED due to exacerbation of her stiff person syndrome symptoms.      DDX (including but not limited to): Rhabdo, AKI, electrolyte abnormality, exacerbation of her chronic illness, spasms    Plan: IV, labs, meds, fluids, reeval    Re-examined and reevaluated.  No acute distress.  Feels better after meds and fluids.    Labs: Hemoglobin is at baseline, remainder of labs within normal limits.     Discussed lab results with patient.  She feels comfortable going home.  She has enough pain medication at home.  She will follow-up with her PCP for further outpatient management.  Return precautions given.  At discharge, patient no acute distress, steady gait, neuro intact.    Pt is clinically stable for d/c home at this time; Return precautions given.  - No significant concern based on presentation or clinical course to warrant further testing in the emergency department.  - At discharge, pt looked well, no distress, and is a good candidate for outpatient follow up.  - Results discussed at length with patient/family.  All questions were answered.  They will return immediately to the emergency department if any new or worsened symptoms.    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 female with past medical history of A-fib, hyperlipidemia, hypertension, diabetes, bipolar disorder, seizure, CVA, stiff person syndrome presents ED due to exacerbation of her stiff person syndrome symptoms.  She states that she is having diffuse aches but worse in her legs.   She takes tramadol and Valium at home which is not helping today.  She denies fever, chills, cough, runny nose, chest pain, shortness of breath, abdominal pain, nausea, vomiting, sick contacts, recent travel, any other complaints.    PMD/Specialists   PMD:  [name], MD     Physical Exam     Vitals:    08/23/22 1154 08/23/22 1158   BP: 134/66    Resp: 18    Temp: 98.3 F (36.8 C)    SpO2: 100%    Weight:  65.8 kg (145 lb)   Height:  5\' 5"  (1.651 m)     Pulse Oximetry Analysis - Normal    Vital Signs Reviewed  GEN:  Alert, awake, comfortable.  Head: Normocephalic, Atraumatic.      Eyes: NL conjunctiva. No discharge. EOMI. PERRL    ENT: Moist mucus membranes.          Chest: No respiratory distress. Breathing nonlabored.  CTA BL.  CV: RRR, No murmurs.      Abd: soft, non-tender, non-distended.      Back: full ROM.   Exts: full ROM. No deformity.       Neuro: moving all extremities, aaox3, no focal neurological deficits.    Skin: Warm and dry. No rash.        Psych: Normal affect. Normal concentration. Cooperative. Calm.         EKG    No results found  for this visit on 08/23/22.    Teresa Coombs, MD  08/23/2022, 1:56 PM     ___________________________    Medications  (ED/RX)     Meds administered in ER this visit:  Medications   sodium chloride (normal saline) 0.9% infusion (1,000 mL Intravenous New Bag 08/23/22 1255)   diazePAM (Valium) injection 2.5 mg (2.5 mg Intravenous Given 08/23/22 1300)   morphine injection 4 mg (4 mg Intravenous Given 08/23/22 1300)       New prescriptions given to patient:  New Prescriptions    No medications on file          Labs     Results for orders placed or performed during the hospital encounter of 08/23/22   BASIC METABOLIC PANEL   Result Value Ref Range    Potassium 4.0 3.5 - 5.5 mmol/L    Sodium 142 133 - 145 mmol/L    Chloride 104 98 - 110 mmol/L    Glucose 86 70 - 99 mg/dL    Calcium 9.1 8.4 - 16.1 mg/dL    BUN 10 6 - 22 mg/dL    Creatinine 0.8 0.8 - 1.4 mg/dL    CO2 30  20 - 32 mmol/L    eGFR >60.0 >60.0 mL/min/1.73 sq.m.    Anion Gap 8.0 3.0 - 15.0 mmol/L   CPK   Result Value Ref Range    CPK 108 30 - 165 U/L   CBC WITH DIFFERENTIAL AUTO   Result Value Ref Range    WBC 4.0 4.0 - 11.0 K/uL    RBC 3.47 (L) 3.80 - 5.20 M/uL    HGB 8.8 (L) 11.7 - 16.0 g/dL    HCT 09.6 (L) 04.5 - 48.0 %    MCV 82 80 - 99 fL    MCH 25 (L) 26 - 34 pg    MCHC 31 31 - 36 g/dL    RDW 40.9 81.1 - 91.4 %    Platelet 232 140 - 440 K/uL    MPV 8.6 (L) 9.0 - 13.0 fL    Segmented Neutrophils (Auto) 42 40 - 75 %    Lymphocytes (Auto) 43 20 - 45 %    Monocytes (Auto) 7 3 - 12 %    Eosinophils (Auto) 7 (H) 0 - 6 %    Basophils (Auto) 1 0 - 2 %    Absolute Neutrophils (Auto) 1.7 (L) 1.8 - 7.7 K/uL    Absolute Lymphocytes (Auto) 1.7 1.0 - 4.8 K/uL    Absolute Monocytes (Auto) 0.3 0.1 - 1.0 K/uL    Absolute Eosinophils (Auto) 0.3 0.0 - 0.5 K/uL    Absolute Basophils (Auto) 0.0 0.0 - 0.2 K/uL              Radiology     No orders to display              Vital Signs this ED Visit     Patient Vitals for the past 24 hrs:   Temp Heart Rate Pulse Resp BP BP Mean SpO2 Weight   08/23/22 1330 -- -- 67 18 143/71 89 MM HG 96 % --   08/23/22 1300 -- -- 74 -- 137/65 84 MM HG 100 % --   08/23/22 1200 -- -- 86 -- 134/66 84 MM HG 98 % --   08/23/22 1158 -- -- -- -- -- -- -- 65.8 kg (145 lb)   08/23/22 1154 98.3 F (36.8 C) 85 -- 18 134/66 89 MM HG 100 % --  Patient History     Past Medical History:  Past Medical History:   Diagnosis Date   . Acute, but ill-defined, cerebrovascular disease     TIA   . Arthropathy     knees   . Asthma    . Bipolar disorder (HCC)    . Cardiac arrhythmia     h/o Afib on Eliquis   . Cerebral artery occlusion with cerebral infarction [facility])     CVA post endodartorectomy   . Chest pain     has history multiple admissions for this, stress test and cardiac cath negative   . Chronic anemia     Hgb ~10   . COPD (chronic obstructive pulmonary disease) (HCC)    . Cough    . COVID    . COVID-19 vaccine  series completed    . CVA (cerebral vascular accident) (HCC) 09/01/2021   . Deficiency anemia 11/2020    rx with Iron Infusions   . Epilepsy (HCC)     per pt last Sz was approx 9-10 months ago   . Esophageal reflux    . Headache    . 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 Hemorrhoids    . History of blood transfusion     following one of her pregnancy   . History of cardiac catheterization no obstructive cad in 2017    . Hyperlipidemia    . Hyperlipidemia    . Hypertension    . Lower leg edema    . Migraines    . Paroxysmal atrial fibrillation (HCC)     on anticoagulation   . Seizure-like activity (HCC)     EEG negative   . Stiff person syndrome    . Syncope     recurrent, has loop recorder   . Thoracic aortic aneurysm (HCC)    . TIA (transient ischemic attack)     multiple since age 41's per patient. Multiple MRI brain in the past negative for evidence of stroke. Suspected complicated migraine.   . Type 2 diabetes mellitus (HCC)     not on medication       Past Surgical History:   Past Surgical History:   Procedure Laterality Date   . BREAST BIOPSY Right 06/18/2021    right ultrasound core biopsy with clip placed   . CARDIAC CATH  2016, 02/2016   . CAROTID ENDARTERECTOMY     . COLONOSCOPY N/A 07/01/2013    Procedure: COLONOSCOPY;  Surgeon: Earlean Shawl, MD;  Location: [facility] ENDO OR LOC;  Service: Endoscopy GI;  Laterality: N/A;   . COLONOSCOPY N/A 04/13/2015    Procedure: COLONOSCOPY FLEXIBLE;  Surgeon: Posey Rea, MD;  Location: [facility] ENDO OR LOC;  Service: Endoscopy GI;  Laterality: N/A;   . COLONOSCOPY WITH BIOPSY N/A 04/13/2015    Procedure: COLONOSCOPY FLEXIBLE WITH BIOPSY;  Surgeon: Posey Rea, MD;  Location: [facility] ENDO OR LOC;  Service: Endoscopy GI;  Laterality: N/A;   . COLONOSCOPY WITH BIOPSY N/A 01/31/2017    Procedure: COLONOSCOPY FLEXIBLE WITH BIOPSY;  Surgeon: Brett Canales, MD   . COLONOSCOPY WITH BIOPSY  N/A 05/29/2017    Procedure: COLONOSCOPY, WITH BIOPSY;  Surgeon: Gretta Cool, MD   . COLONOSCOPY WITH BIOPSY N/A 05/29/2017    Procedure: COLONOSCOPY, WITH BIOPSY;  Surgeon: Gretta Cool, MD   . EGD N/A 07/01/2013    Procedure: ESOPHAGOGASTRODUODENOSCOPY;  Surgeon: Earlean Shawl, MD;  Location: [facility] ENDO OR LOC;  Service:  Endoscopy GI;  Laterality: N/A;   . EGD N/A 08/08/2014    Procedure: ESOPHAGOGASTRODUODENOSCOPY FLEXIBLE TRANSORAL DIAGNOSTIC;  Surgeon: Phylis Bougie, MD;  Location: [facility] ENDO OR LOC;  Service: Endoscopy GI;  Laterality: N/A;   . EGD WITH BIOPSY N/A 06/07/2016    Procedure: ESOPHAGOGASTRODUODENOSCOPY FLEXIBLE TRANSORAL WITH BIOPSY;  Surgeon: Brett Canales, MD   . EGD WITH BIOPSY N/A 03/20/2020    Procedure: EGD, WITH BIOPSY;  Surgeon: Gretta Cool, MD   . EGD WITH BLEEDING CONTROL N/A 05/29/2017    Procedure: EGD, WITH HEMORRHAGE CONTROL;  Surgeon: Gretta Cool, MD   . EXCISION OF BENIGN LESION Right 10/19/2021    Procedure: EXCISION, LESION, BENIGN, BREAST;  Surgeon: Eulis Manly, MD   . HEART CATHETERIZATION     . HYSTERECTOMY     . LOOP ELECTROSURGICAL EXCISON PROCEDURE     . LOOP RECORDER  2018   . OTHER Right 06/18/2021    axillary lymph node biopsy with clip placed   . OTHER  05/2021    Left leg ablation procedure   . VENOUS ACCESS CATHETER INSERTION Right 03/29/2019       Family History:   Family History   Problem Relation Age of Onset   . Hypertension Mother    . Diabetes Mother    . Hypertension Sister    . Diabetes Brother    . Hypertension Brother    . Other Family History Father         Unknown to patient   . Anesthesia Reaction Neg Hx        Social History:   Social History     Socioeconomic History   . Marital status: Divorced     Spouse name: Not on file   . Number of children: Not on file   . Years of education: Not on file   . Highest education level: Not on file   Occupational History   . Not on file   Tobacco Use   . Smoking status: Never     Passive exposure:  Never   . Smokeless tobacco: Never   Vaping Use   . Vaping Use: Never used   Substance and Sexual Activity   . Alcohol use: No     Alcohol/week: 0.0 standard drinks of alcohol   . Drug use: No   . Sexual activity: Not on file   Other Topics Concern   . Back Care Not Asked   . Bike Helmet Not Asked   . Blood Transfusions Not Asked   . Caffeine Concern Not Asked   . Counseling Not Asked   . Depression Concerns Not Asked   . Depression Screening Not Asked   . Exercise Not Asked   . Hobby Hazards Not Asked   . Military Service Not Asked   . Occupational Exposure Not Asked   . Seat Belt Not Asked   . Self-Exams Not Asked   . Sleep Concern Not Asked   . Smoke Detectors Not Asked   . Smoking Concerns Not Asked   . Smoking Cessation Not Asked   . Special Diet Not Asked   . Stress Concern Not Asked   . Weight Concern Not Asked   . Do you use a bike helmet? Not Asked   . Do you use caffeine daily? Not Asked   . Do you exercise? Not Asked   . Are there hazards related to your hobbies? Not Asked   . Military Service Not Asked   .  Do you have any work place hazards? Not Asked   . Do you wear a seat belt while in a moving vehicle? Not Asked   . Do you have sleep concerns? Not Asked   . Do you follow a special diet? Not Asked   . Are you concerned with your weight? Not Asked   Social History Narrative    DNR/DNI    MPOA - her daughter Maureen Chatters     Social Determinants of Health     Financial Resource Strain: Low Risk  (05/04/2022)    Overall Financial Resource Strain (CARDIA)    . Difficulty of Paying Living Expenses: Not hard at all   Food Insecurity: No Food Insecurity (05/04/2022)    Hunger Vital Sign    . Worried About Programme researcher, broadcasting/film/video in the Last Year: Never true    . Ran Out of Food in the Last Year: Never true   Transportation Needs: Unmet Transportation Needs (05/04/2022)    PRAPARE - Transportation    . Lack of Transportation (Medical): Yes    . Lack of Transportation (Non-Medical): Yes   Physical Activity: Inactive  (05/04/2022)    Exercise Vital Sign    . Days of Exercise per Week: 0 days    . Minutes of Exercise per Session: 0 min   Stress: No Stress Concern Present (05/04/2022)    [employer] of Occupational Health - Occupational Stress Questionnaire    . Feeling of Stress : Not at all   Social Connections: Moderately Integrated (05/04/2022)    Social Connection and Isolation Panel [NHANES]    . Frequency of Communication with Friends and Family: More than three times a week    . Frequency of Social Gatherings with Friends and Family: More than three times a week    . Attends Religious Services: More than 4 times per year    . Active Member of Clubs or Organizations: Yes    . Attends Banker Meetings: More than 4 times per year    . Marital Status: Divorced   Catering manager Violence: Not At Risk (05/04/2022)    Humiliation, Afraid, Rape, and Kick questionnaire    . Fear of Current or Ex-Partner: No    . Emotionally Abused: No    . Physically Abused: No    . Sexually Abused: No   Housing Stability: Low Risk  (05/04/2022)    Housing Stability Vital Sign    . Unable to Pay for Housing in the Last Year: No    . Number of Places Lived in the Last Year: 1    . Unstable Housing in the Last Year: No       Home Medications:   Home Medication List - Marked as Reviewed on 08/23/22 1204   Medication Sig   apixaban (ELIQUIS) 5 mg PO TABS Take 1 Tab by Mouth Twice Daily.   Blood Sugar Diagnostic (BLOOD GLUCOSE TEST) Misc STRP Use to check blood sugars once daily   diazePAM (VALIUM) 5 mg PO TABS TAKE 1 TABLET IN THE EVENING AS NEEDED FOR SPASMS   diclofenac sodium 1 % Top GEL USE 4 GRAM TO AFFECTED AREA 4 TIMES DAILY.   diphenhydrAMINE (BENADRYL) 25 mg PO CAPS Take 1 Cap by Mouth Every 6 Hours As Needed.   EPINEPHrine (EPIPEN) 0.3 mg/0.3 mL Inj AtIn Inject 0.3 mL into the muscle Take As Needed for Other (ANAPHYLAXIS). Q 10-30 minutes prn for ANAPHYLAXIS ONLY   gabapentin (NEURONTIN) 100 mg PO  CAPS Take 1 Cap by Mouth 3  Times Daily.   hydrOXYzine pamoate (VISTARIL) 50 mg PO CAPS TAKE 1 CAPSULE EVERY MORNING AND 1 CAP EVERY AFTERNOON AND 2 CAPS AT BEDTIME AS NEEDED   hyoscyamine (LEVSIN SL) 0.125 mg SL SUBL Dissolve 1 Tab under tongue Every 4 Hours As Needed (bladder spasms).   lamoTRIgine (LAMICTAL) 100 mg PO TABS Take 1 Tab by Mouth Once a Day. Indications: epilepsy   Lancets Misc MISC Use to check blood sugars once daily   lurasidone 120 mg PO TABS TAKE 1 TABLET BY ORAL ROUTE 1 TIME PER DAY WITH FOOD (AT LEAST 350 CALORIES)   MEDICATION TYPE-IN 1 Dose. by Misc.(Non-Drug; Combo Route) route As Directed. Glucometer   metoCLOPramide (REGLAN) 10 mg PO TABS 1 TABLET BY MOUTH 4 TIMES DAILY (BEFORE MEALS)   naloxone (NARCAN) 4 mg/actuation NA nasal spray    pantoprazole (PROTONIX) 40 mg PO TBEC Take 1 Tab by Mouth 2 (two) times a day.   promethazine (PHENERGAN) 25 mg PO TABS Take 1 Tab by Mouth Every 6 Hours As Needed.   QUEtiapine (SEROQUEL) 100 mg PO TABS Take 1 Tab by Mouth Every Night at Bedtime.   rOPINIRole (REQUIP) 0.5 mg PO TABS TAKE 1 TABLET EVERY EVENING AT BEDTIME FOR 3 MONTHS (90 DAYS)   thiamine 100 mg PO TABS Take 1 Tab by Mouth Once a Day.   TORSEMIDE PO Take  by Mouth.   traMADoL (ULTRAM) 50 mg PO TABS Take 1 Tab by Mouth 2 Times Daily As Needed. for pain       Allergies:   Allergies   Allergen Reactions   . Montelukast rash/itching and swelling   . Nitroglycerin hives and rash/itching   . Immune Globulin (Human) (Igg) swelling and rash/itching     Pt complain of itchiness and swelling of lips and tongue. Saturation fine, bp elevated.   . Magnesium Sulfate rash/itching   . Amoxicillin hives     Has tolerated ceftriaxone, cephalexin   . Atorvastatin unknown and other/intolerance     Rhabdomyolysis   Pt states she thinks it caused her to have rhabdo   . Bumex [Bumetanide] muscle/joint pain   . Ceftriaxone rash/itching   . Magnesium Chloride rash/itching   . Tetracaine Hcl unknown   . Iodinated Contrast Media swelling      Throat swelling and lip itching   . Penicillins hives     Has tolerated ceftriaxone and cephalexin   . Tetracyclines hives   . Latex, Natural Rubber rash/itching and hives   . Kit Prep Of Tc-11m-Tetrofosmin unknown

## 2022-09-05 ENCOUNTER — Ambulatory Visit: Payer: 59

## 2022-09-15 LAB — HTLV-I/-II ANTIBODY SCREEN: HTLV-I/II Antibodies, Qual: NEGATIVE

## 2022-09-27 ENCOUNTER — Ambulatory Visit: Payer: 59 | Admitting: Neurology

## 2022-09-30 ENCOUNTER — Ambulatory Visit: Payer: 59 | Admitting: Neurology

## 2022-10-03 ENCOUNTER — Ambulatory Visit: Payer: 59

## 2022-10-06 ENCOUNTER — Ambulatory Visit: Payer: 59 | Admitting: Neurology

## 2022-11-01 ENCOUNTER — Ambulatory Visit: Payer: 59

## 2022-11-17 ENCOUNTER — Ambulatory Visit: Payer: 59 | Admitting: Neurology

## 2022-12-01 ENCOUNTER — Ambulatory Visit: Payer: 59 | Admitting: Neurology

## 2022-12-15 ENCOUNTER — Ambulatory Visit: Payer: 59

## 2023-01-19 ENCOUNTER — Ambulatory Visit
Admission: RE | Admit: 2023-01-19 | Discharge: 2023-01-19 | Disposition: A | Discharge status: Home / Self Care | Payer: 59 | Source: Ambulatory Visit | Attending: Neurology | Admitting: Neurology

## 2023-01-19 DIAGNOSIS — R269 Unspecified abnormalities of gait and mobility: Secondary | ICD-10-CM

## 2023-01-19 DIAGNOSIS — G629 Polyneuropathy, unspecified: Secondary | ICD-10-CM

## 2023-01-19 DIAGNOSIS — R278 Other lack of coordination: Secondary | ICD-10-CM

## 2023-01-19 DIAGNOSIS — R634 Abnormal weight loss: Secondary | ICD-10-CM

## 2023-01-20 ENCOUNTER — Telehealth: Payer: Self-pay | Admitting: Neurology

## 2023-01-20 NOTE — Telephone Encounter (Signed)
Pt called in regards to see if Dr. Waunita Schooner and team can prescribe the pt medication to help her anxiety/ claustrophobia while she gets her MRI procedure completed. Pt would like a call back or message if possible please advise.          [id number] [place], [place] - [address] Phone: [phone]   Fax: [phone]

## 2023-01-23 ENCOUNTER — Other Ambulatory Visit: Payer: Self-pay

## 2023-01-23 DIAGNOSIS — R269 Unspecified abnormalities of gait and mobility: Secondary | ICD-10-CM

## 2023-01-23 DIAGNOSIS — G2582 Stiff-man syndrome: Secondary | ICD-10-CM

## 2023-01-23 DIAGNOSIS — G629 Polyneuropathy, unspecified: Secondary | ICD-10-CM

## 2023-01-23 MED ORDER — LORAZEPAM 2 MG PO TABS
ORAL_TABLET | ORAL | 0 refills | Status: DC
Start: 2023-01-23 — End: 2023-11-23

## 2023-01-23 NOTE — Telephone Encounter (Signed)
Prescription routed to Dr Waunita Schooner for signature.     Patient reached by telephone to advise that RX is to be sent . Per patient she has not yet scheduled her Mri - plans to call to do so now.

## 2023-01-23 NOTE — Telephone Encounter (Signed)
LOV 07/2022  Follow up not yet scheduled    Ativan Pended for upcoming MRI

## 2023-02-20 ENCOUNTER — Other Ambulatory Visit: Payer: Self-pay | Admitting: Neurology

## 2023-02-20 ENCOUNTER — Ambulatory Visit
Admission: RE | Admit: 2023-02-20 | Discharge: 2023-02-20 | Disposition: A | Discharge status: Home / Self Care | Payer: Medicare Other | Source: Ambulatory Visit | Attending: Neurology | Admitting: Neurology

## 2023-02-20 DIAGNOSIS — R269 Unspecified abnormalities of gait and mobility: Secondary | ICD-10-CM | POA: Insufficient documentation

## 2023-02-20 DIAGNOSIS — R278 Other lack of coordination: Secondary | ICD-10-CM | POA: Insufficient documentation

## 2023-02-20 DIAGNOSIS — G629 Polyneuropathy, unspecified: Secondary | ICD-10-CM | POA: Insufficient documentation

## 2023-02-20 DIAGNOSIS — R634 Abnormal weight loss: Secondary | ICD-10-CM | POA: Insufficient documentation

## 2023-02-20 MED ORDER — GADOBUTROL 1 MMOL/ML IV SOSY (WRAP)
7.0000 mL | Freq: Once | INTRAVENOUS | Status: AC | PRN
Start: 2023-02-20 — End: 2023-02-20
  Administered 2023-02-20: 7 mL via INTRAVENOUS
  Filled 2023-02-20: qty 7.5

## 2023-04-06 ENCOUNTER — Other Ambulatory Visit: Payer: Self-pay | Admitting: Neurology

## 2023-04-06 DIAGNOSIS — R269 Unspecified abnormalities of gait and mobility: Secondary | ICD-10-CM

## 2023-04-06 DIAGNOSIS — G2582 Stiff-man syndrome: Secondary | ICD-10-CM

## 2023-04-06 DIAGNOSIS — G629 Polyneuropathy, unspecified: Secondary | ICD-10-CM

## 2023-04-11 ENCOUNTER — Telehealth: Payer: Self-pay | Admitting: Neurology

## 2023-04-11 NOTE — Telephone Encounter (Signed)
Patient called to ask if the nurse or Dr Theodoro Grist could please call her and let her know the findings from her two MRI's done in early May. Please call her at [phone].

## 2023-04-20 NOTE — Telephone Encounter (Signed)
Return phone call placed  Advised that results are reviewed at follow up appointments.    Pt states plan to obtain emg results from the Neurology office in [place]    Scheduled for follow up in July

## 2023-05-01 NOTE — Progress Notes (Signed)
Medicare Wellness Exam  [name], female, medical record number [id number], DOB:1955-11-27 presents today for her wellness visit.      Vital signs:  BP 110/70   Pulse 89   Temp 98.6 F (37 C)   Ht 5\' 5"  (1.651 m)   Wt 90.2 kg (198 lb 12.8 oz)   LMP  (LMP Unknown)   SpO2 98%   BMI 33.08 kg/m     Patient Active Problem List   Diagnosis   . Essential hypertension   . Paroxysmal atrial fibrillation (HCC)   . Bipolar 1 disorder (HCC)   . Iron deficiency anemia due to chronic blood loss   . Hypokalemia   . Chronic obstructive pulmonary disease (HCC)   . Pure hypercholesterolemia   . Gastroesophageal reflux disease without esophagitis   . Chronic headache   . History of nuclear stress test in 05/2108   . History of cardiac catheterization no obstructive cad in 2017    . Transient neurologic deficit   . TIA (transient ischemic attack)   . Stable angina pectoris (HCC)   . Type 2 diabetes mellitus with diabetic neuropathy, without long-term current use of insulin (HCC)   . Abnormal EKG   . Vertigo   . Slurred speech   . Headache   . Weakness of left upper extremity   . Progressive focal motor weakness   . History of cardiac cath no obstructive cad in cath in 2014 and 2017   . Hx of TIA (transient ischemic attack) and stroke 08/2016   . Right sided weakness   . Other headache syndrome   . Acute chest pain   . Atypical chest pain   . Left-sided weakness   . Seizure (HCC)   . Chronic anemia   . Bleeding per rectum   . Syncope   . Iron deficiency anemia, unspecified   . Migraine   . Facial droop   . Left hand paresthesia   . Fall   . Unable to coordinate sucking, swallowing, and breathing   . Left sided numbness   . Chronic diastolic (congestive) heart failure (HCC)   . Acute focal neurological deficit   . Weakness   . Intractable headache   . Other fatigue   . Chest pain   . Facial paresthesia   . Chest pain, unspecified type   . Acute renal failure (ARF) (HCC)   . ARF (acute renal failure) (HCC)   . Aortic root  dilation (HCC)   . Non-traumatic rhabdomyolysis   . Paresthesia   . Acute encephalopathy   . Acute on chronic anemia   . UTI (urinary tract infection)   . Suprapubic catheter dysfunction, initial encounter (HCC)   . Malfunction of indwelling urinary catheter (HCC)   . Syncope and collapse     Past Medical History:   Diagnosis Date   . Acute, but ill-defined, cerebrovascular disease     TIA   . Arthropathy     knees   . Asthma    . Bipolar disorder (HCC)    . Cardiac arrhythmia     h/o Afib on Eliquis   . Cerebral artery occlusion with cerebral infarction [facility])     CVA post endodartorectomy   . Chest pain     has history multiple admissions for this, stress test and cardiac cath negative   . Chronic anemia     Hgb ~10   . COPD (chronic obstructive pulmonary disease) (HCC)    . Cough    .  COVID    . COVID-19 vaccine series completed    . CVA (cerebral vascular accident) (HCC) 09/01/2021   . Deficiency anemia 11/2020    rx with Iron Infusions   . Epilepsy (HCC)     per pt last Sz was approx 9-10 months ago   . Esophageal reflux    . Headache    . 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 Hemorrhoids    . History of blood transfusion     following one of her pregnancy   . History of cardiac catheterization no obstructive cad in 2017    . Hyperlipidemia    . Hyperlipidemia    . Hypertension    . Lower leg edema    . Migraines    . Paroxysmal atrial fibrillation (HCC)     on anticoagulation   . Seizure-like activity (HCC)     EEG negative   . Stiff person syndrome    . Syncope     recurrent, has loop recorder   . Thoracic aortic aneurysm (HCC)    . TIA (transient ischemic attack)     multiple since age 33's per patient. Multiple MRI brain in the past negative for evidence of stroke. Suspected complicated migraine.   . Type 2 diabetes mellitus (HCC)     not on medication   . Unable to coordinate sucking, swallowing, and breathing 09/28/2019      Immunization History   Administered Date(s) Administered   . Covid-19 Vaccine, Mrna-1273 (Moderna) 0.61ml Imm Susp 11/04/2020   . Covid-19 Vaccine, Mrna-1273 0.5ML IM (PF)(Moderna) Vac 11/30/2019, 01/01/2020   . Flulaval (Medicare Only) 07/17/2019   . INFLU VAC 0.5 mL IM INJ,UNSPECIFIED 11/14/2016, 07/17/2019, 08/04/2021   . Influenza Vaccine 08/25/2013, 11/16/2016, 11/16/2016, 08/16/2017, 07/24/2018, 07/17/2019   . Pneumo polysaccharide 0.5 mL SC/IM (Pneumovax-23) vac inj 10/28/2012, 05/17/2017   . Prevnar (Pneum 13) Imm 07/07/2017   . Rho D Immune Globulin Inj 05/17/2017   . Tdap 0.5 mL IM (Adacel 34yr+) vac inj 03/20/2018     Family History   Problem Relation Age of Onset   . Hypertension Mother    . Diabetes Mother    . Hypertension Sister    . Diabetes Brother    . Hypertension Brother    . Other Family History Father         Unknown to patient   . Anesthesia Reaction Neg Hx        Medications:  has a current medication list which includes the following prescription(s): ammonium lactate, apixaban, diazepam, diclofenac sodium, epinephrine, gabapentin, hydroxyzine pamoate, hyoscyamine, lamotrigine, lurasidone, lurasidone, metoclopramide, metoprolol xl, pantoprazole, potassium chloride, promethazine, quetiapine, ropinirole, torsemide, and tramadol.    Social History:  reports that she has never smoked. She has never been exposed to tobacco smoke. She has never used smokeless tobacco. She reports that she does not drink alcohol and does not use drugs., Counseling given: No       Basic Health Risk Assessment:    In general how would you say your health is?: Fair  Do you need assistance with the phone, transportation, shopping, meal preparation, housework, taking/obtaining medications, managing your finances or other activities of daily living?: (!) taking/obtaining medications, walking, managing your finances, other activities, transportation, shopping, meal preparation  Do you have a caregiver or someone  designated to help you at home?: Daughter  Do you feel your home environment is safe?: Yes  Do you have significant problems  with memory loss or confusion or has a family member expressed concerns about this?: (!) Yes  Do you exercise for 20 minutes three or more days a week?: (!) No  Have you had problems with bladder control or urine leakage?: (!) Yes  Do you have difficulty with your hearing?: (!) Yes  Have you ever had significant episodes of anxiety or depression?: (!) Yes  Can you afford your medications?: Yes  The Healthy Change that I would like to make is: (check all that apply): (!) Generally Improve my Health, Start/Increase Exercising, Eat Healthier  Have you used an opioid containing pain medication (Percocet, Hydrocodone, Oxycontin, Morphine, Tramadol) in the last 6 months?: (!) Yes  Is pain currently interfering with your daily activities?: Yes  Do you always take these medications as prescribed?: Yes  Have you ever borrowed someone else's pain medication?: No  Has the pain medication ever made you feel dizzy, off balance, or caused you to fall?: No  Do you ever drink alcohol when taking your pain medication?: No         Safety/Fall Risk Screening (STEADI):       Cognitive Screening (if applicable):    (normal is 4 or 5)    Advance Care Planning (if applicable):  Do you have a living will/advance directive?: Yes       Vision Examination: (required by IPPE only)  No results found.    Preventative Care Review (required):  The patient's preventative care was reviewed? Yes    Health Maintenance   Topic Date Due   . SPIROMETRY (PFT)  Never done   . DIABETIC EYE EXAM  03/24/2016   . RESPIRATORY SYNCTIAL VIRUS (RSV) SCDM 60+ OR PREGNANT (1 - 1-dose 60+ series) Never done   . PHYSICAL/EXAM  05/29/2021   . MAMMOGRAM/BREAST CANCER SCREENING  06/18/2022   . COVID-19 VACCINE (4 - 2023-24 season) 06/24/2022   . OSTEOPOROSIS SCREENING  Never done   . PCV20 VACCINE (SCDM) DISCUSSION  Never done   . PNEUMOCOCCAL  VACCINE: 65+ (3 of 3 - PPSV23 or PCV20) 02/26/2023   . A1C  06/22/2023   . DIABETIC NEPHRO SCREENING (Urine Alb/Cr Ratio)  09/10/2023   . LIPID SCREENING  01/13/2024   . CREATININE (KIDNEY FUNCTION TEST)  01/13/2024   . TD VACCINE  03/20/2028   . COLORECTAL CANCER SCREENING  03/26/2030   . TDAP VACCINE  Completed   . HEP-C SCREENING  Completed   . ANNUAL MED ADVANTAGE WELLNESS VISIT (MA)  Completed   . HPV  Aged Out   . PAP/ CERVICAL CANCER SCREENING  Discontinued   . SHINGLES VACCINE (Shingrix)  Discontinued   . FLU VACCINE  Discontinued       I have reviewed information entered by the clinical staff and/or patient and verified it as accurate or edited where necessary.    Current list of Health Care Providers:  Patient Care Team:  [name], MD as PCP - General (Family Practice)  Dery, Peyton Bottoms, Pharmacist (Ambulatory Pharmacist)  Francella Solian, Pharmacist (Ambulatory Pharmacist)  Nino Glow, MD as Consulting Provider (Neurology)  Miachel Roux, MD as Cardiologist (Cardiology)  Arnetha Courser, MD as Hematologist (Hematology and Oncology)  Betsey Holiday, RN as RN  Lucianne Muss, Patrecia Pace, MD as Consulting Provider (Hospitalist)  Gretta Cool, MD as Gastroenterologist (Gastroenterology)  Dr. Earnest Rosier as Psychiatrist (Psychiatry)  Naomie Dean, NP as Urologist (Urology)  Matthew Folks Levy Sjogren, RN as Integrated Care Manager [facility] Manager)  PPPS/Care Plan was reviewed and a copy given to the patient.

## 2023-05-01 NOTE — Progress Notes (Signed)
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 - Plan: Vitamin B12, Phosphorus, Serum, Magnesium, Serum, COMPREHENSIVE METABOLIC PANEL, CBC WITH DIFFERENTIAL, CBC WITH DIFFERENTIAL, COMPREHENSIVE METABOLIC PANEL, Magnesium, Serum, Phosphorus, Serum, Vitamin B12    (Z51.81) Medication monitoring encounter - Plan: Vitamin B12, Vitamin B12    (I48.0) Paroxysmal atrial fibrillation (HCC) - Plan: Magnesium, Serum, Magnesium, Serum    (Z12.31) Encounter for screening mammogram for malignant neoplasm of breast - Plan: MAMMO-DIGITAL SCREENING BILAT W/ TOMOSYNTHESIS    (Z13.820) Screening for osteoporosis - Plan: BONE DENSITY STUDY - CENTRAL    (I20.89) Stable angina pectoris (HCC)           DOCUMENTATION:    MEDICATIONS AND ALLERGIES WERE REVIEWED AND UPDATED IN EPIC  THE HEALTH MAINTENANCE RECORD WAS REVIEWED AND UPDATED IN EPIC  THE SOCIAL AND FAMILY HISTORY WAS REVIEWED AND UPDATED IN EPIC  THE PATIENTS PROBLEM LIST, PAST MEDICAL AND SURGICAL HISTORY WERE REVIEWED AND UPDATED.  SMOKING HISTORY WAS REVIEWED AND UPDATED IN EPIC        Return in about 6 months (around 11/01/2023), or if symptoms worsen or fail to improve.         Chief Complaint         Patient presents with   . FOLLOW-UP HYPERTENSION     Pt is here for a routine check up and medication check         History of Present Illness        ER visit last week for Stiff person flare up.  She has had 2 falls over the past month.  She changed her shoes.  She thinks that may have contributed.  She does feel  steadier now.  She sees [facility] regularly.  Next appt is next week.  She gets Tramadol and takes Valium to manage the sxs and pain.      ATRIAL FIBRILLATION  The patient presents for evaluation of atrial fibrillation.  Anticoagulation therapy is Eliquis.  She is compliant with medications.  Patient denies abnormal bleeding, bruising, palpitations, shortness or breath or edema.  Interval comments: none    CAD  The patient presents for evaluation of Coronary Artery Disease.  She is compliant with medications.  Patient denies exertional chest pain, pressure, or shortness of breath.  Interval comments: Last cardiology appt was last month - Dr. Aneta Mins  She is scheduled to have ultrasound of her legs      Diet controlled Diabetes Type II With no complications  The patient presents for evaluation of diabetes.  She is Not on medications.  Patient denies hypoglycemic symptoms or foot complaints.  Home blood glucose readings are not being checked..  Patient is not on chronic insulin therapy.  Interval Comments: none      Home Medications     Outpatient Medications Marked as Taking for the 05/01/23 encounter (Office Visit) with [name], MD   Medication Sig Dispense Refill   . Ammonium Lactate 12 % Top CREA PLEASE SEE ATTACHED FOR DETAILED DIRECTIONS     .  apixaban (ELIQUIS) 5 mg PO TABS Take 1 Tab by Mouth Twice Daily.     . diazePAM (VALIUM) 5 mg PO TABS 2 (two) times a day.     . diclofenac sodium 1 % Top GEL USE 4 GRAM TO AFFECTED AREA 4 TIMES DAILY. 200 g 2   . EPINEPHrine (EPIPEN) 0.3 mg/0.3 mL Inj AtIn Inject 0.3 mL into the muscle Take As Needed for Other (ANAPHYLAXIS). Q 10-30 minutes prn for ANAPHYLAXIS ONLY 2 Each 1   . gabapentin (NEURONTIN) 100 mg PO CAPS Take 1 Cap by Mouth 3 Times Daily.     . hydrOXYzine pamoate (VISTARIL) 50 mg PO CAPS Take 1 Cap by Mouth 2 Times Daily As Needed.     . lamoTRIgine (LAMICTAL) 100 mg PO TABS Take 1 Tab by Mouth Once a Day. Indications: epilepsy     . lurasidone (LATUDA) 40 mg  PO TABS TAKE 1 TABLET DAILY WITH FOOD (AT LEAST 350 CALORIES)     . lurasidone 120 mg PO TABS Take 1 Tab by Mouth Once a Day.     . metoCLOPramide (REGLAN) 10 mg PO TABS Take 1 Tab by Mouth Take As Needed.     . metoprolol XL (TOPROL XL) 25 mg PO TB24 Take 1 Tab by Mouth Once a Day.     . pantoprazole (PROTONIX) 40 mg PO TBEC take 1 tablet by mouth twice a day 180 Tab 0   . potassium chloride (KLOR-CON) 10 mEq PO TbSR Take 1 Tab by Mouth 3 Times Daily.     . promethazine (PHENERGAN) 25 mg PO TABS Take 1 Tab by Mouth Every 6 Hours As Needed. 60 Tab 0   . QUEtiapine 50 mg PO TABS Take 1 Tab by Mouth Every Night at Bedtime.     Marland Kitchen rOPINIRole (REQUIP) 0.5 mg PO TABS TAKE 2 TABS BY MOUTH EVERY NIGHT AT BEDTIME. 180 Tab 1   . torsemide (DEMADEX) 20 mg PO TABS Take 1 Tab by Mouth Twice Daily.     . traMADoL (ULTRAM) 50 mg PO TABS Take 1 Tab by Mouth 2 Times Daily As Needed. for pain 60 Tab 0       Past Histories     The patient's medical, family, and social history (including tobacco usage) were reviewed and updated as appropriate.    Tobacco History reviewed:  Social History     Tobacco Use   Smoking Status Never   . Passive exposure: Never   Smokeless Tobacco Never      Counseling given: No      Physical Exam   Vital Signs: BP 110/70   Pulse 89   Temp 98.6 F (37 C)   Ht 5\' 5"  (1.651 m)   Wt 90.2 kg (198 lb 12.8 oz)   LMP  (LMP Unknown)   SpO2 98%   BMI 33.08 kg/m      Physical Exam  Constitutional:       Appearance: Normal appearance.   HENT:      Head: Normocephalic and atraumatic.      Right Ear: Tympanic membrane normal.      Left Ear: Tympanic membrane normal.   Eyes:      Extraocular Movements: Extraocular movements intact.      Conjunctiva/sclera: Conjunctivae normal.   Cardiovascular:      Rate and Rhythm: Normal rate and regular rhythm.      Heart sounds: Normal heart sounds.   Pulmonary:      Effort: Pulmonary effort  is normal.      Breath sounds: Normal breath sounds.   Abdominal:      General: Bowel  sounds are normal.      Palpations: Abdomen is soft.   Musculoskeletal:      Right lower leg: Edema present.      Left lower leg: Edema present.   Neurological:      General: No focal deficit present.      Mental Status: She is alert and oriented to person, place, and time.   Psychiatric:         Mood and Affect: Mood normal.         Behavior: Behavior normal.         Thought Content: Thought content normal.         Judgment: Judgment normal.                Recent Results & Studies   Lab results are pending.            I have reviewed information entered by the clinical staff and/or patient and verified it as accurate or edited where necessary.    Signature:  [name], MD  [facility]   Dept: [phone]  Dept Fax: [phone]

## 2023-05-05 NOTE — Telephone Encounter (Signed)
See "Reason for Call"

## 2023-05-09 ENCOUNTER — Ambulatory Visit: Payer: Medicare Other | Attending: Neurology | Admitting: Neurology

## 2023-05-09 ENCOUNTER — Encounter: Payer: Self-pay | Admitting: Neurology

## 2023-05-09 VITALS — BP 130/85 | HR 81 | Resp 16 | Ht 65.0 in | Wt 203.0 lb

## 2023-05-09 DIAGNOSIS — G959 Disease of spinal cord, unspecified: Secondary | ICD-10-CM | POA: Insufficient documentation

## 2023-05-09 DIAGNOSIS — Z0000 Encounter for general adult medical examination without abnormal findings: Secondary | ICD-10-CM | POA: Insufficient documentation

## 2023-05-09 DIAGNOSIS — E559 Vitamin D deficiency, unspecified: Secondary | ICD-10-CM | POA: Insufficient documentation

## 2023-05-09 DIAGNOSIS — G620 Drug-induced polyneuropathy: Secondary | ICD-10-CM | POA: Insufficient documentation

## 2023-05-09 DIAGNOSIS — E538 Deficiency of other specified B group vitamins: Secondary | ICD-10-CM | POA: Insufficient documentation

## 2023-05-09 DIAGNOSIS — G629 Polyneuropathy, unspecified: Secondary | ICD-10-CM | POA: Insufficient documentation

## 2023-05-09 DIAGNOSIS — T452X5A Adverse effect of vitamins, initial encounter: Secondary | ICD-10-CM | POA: Insufficient documentation

## 2023-05-09 DIAGNOSIS — G320 Subacute combined degeneration of spinal cord in diseases classified elsewhere: Secondary | ICD-10-CM | POA: Insufficient documentation

## 2023-05-09 NOTE — Progress Notes (Signed)
[facility]  MULTIPLE SCLEROSIS & [facility]UROIMMUNOLOGY CENTER  Main Line / Scheduling / on-call: [phone]  Clinic Tel: [phone]  Fax: [phone]  Send [place] a mychart or inbasket message for the fastest response  ~~~~~~~~~~~~~~~~~~~~~~~~~~~~~~~~~~~~~~~~~~~~~~~~~~~~~~~~~~~~~~~~~~~~~~~~~~~~~~~~~~~~    CC: Dorsal column myelopathy    HPI:   History was obtained from records review, patient,     67 y.o. year-old female with hx of A-fib.     She is not using a walker anymore. Still with considerable leg discomfort. Not having to cath bladder for past 2 months. Feels like she is walking on marbles. Still some imbalance.     EXAM  Visit Vitals  BP 130/85 (BP Site: Left arm, Patient Position: Sitting, Cuff Size: Large)   Pulse 81   Resp 16   Ht 1.651 m (5\' 5" )   Wt 92.1 kg (203 lb)   LMP  (LMP Unknown)   SpO2 97%   BMI 33.78 kg/m     General:   Fundoscopic:   Psychiatric.   Mental Status: The patient was awake, alert, appropriate  Cranial Nerves: CN II-XII ok  Motor: 5/5  Reflex: 1+, right toe equivocal, left toe down  Sensation:   Coordination:   Gait: pos romberg      Imaging     Reports:  MRI Cervical Spine W WO Contrast    Result Date: 02/26/2023  Impression:  1. Within the confines of motion artifact, no definite focal intramedullary signal abnormality and enhancement within the cervical spinal cord. 2. Degenerative changes appear similar in extent to prior examination. Electronically signed by: Susy Frizzle M.D. [facility] DF: 02/26/23    MRI Thoracic Spine W WO Contrast    Result Date: 02/26/2023  Impression:  1. Motion degraded examination. There is suspicion of T2 hyperintense signal abnormality within primarily the mid-dorsal spinal cord at the thoracic levels as described above. No definite associated enhancement. Findings are nonspecific but could represent sequela of a chronic demyelinating/inflammatory process, or possibly a metabolic abnormality including B12 deficiency.  2. Stable mild degenerative changes. Electronically signed by: Susy Frizzle M.D. [facility] DF: 02/26/23    MRI Brain WO Contrast    Result Date: 01/13/2023  Impression:  IMPRESSION: 1. No acute abnormality. 2. Moderate chronic microvascular ischemic disease. Electronically Signed   By: Feliberto Harts M.D.   On: 01/13/2023 15:34    CT Head WO Contrast    Result Date: 01/12/2023  Impression:  IMPRESSION: CT head: 1.  No evidence of an acute intracranial abnormality. 2. Moderate chronic small vessel ischemic changes within the cerebral white matter. 3. Mild cerebellar atrophy. CT cervical spine: 1.  No evidence of an acute intracranial abnormality. 2. Nonspecific straightening of the expected cervical lordosis. 3. Slight grade 1 retrolisthesis at C5-C6. 4. Cervical spondylosis. Electronically Signed   By: Jackey Loge D.O.   On: 01/12/2023 12:25       Testing       Labs  Lab Results   Component Value Date    VITB6 <2.0 (L) 12/25/2021    HGBA1C 5.3 12/27/2021    EGFR >60.0 03/05/2022    CREAT 0.5 03/05/2022    WBC 5.75 03/05/2022    IGA 318 12/28/2021    B12 442 12/25/2021    VITD 10 (L) 12/28/2021     CSF glc 59, prot 42, r0, w1. ENC2 or GAD65 never sent. OCB never sent.     Serum GAD65 0.19 -> nonspecific. At value <2.0  nM unlikely to cause neurologic disease. It is probably a consequence of IVIG given [facility] panel sent on 3/7th    ASSESSMENT / PLAN  RTC: Return in about 1 month (around 06/09/2023) for [place].    1. Subacute combined degeneration    2. Sensory neuropathy    3. Myelopathy    4. Vitamin B6 induced neuropathy    5. Vitamin B12 deficiency    6. 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 Her syndrome and clinical findings are inconsistent with stiff person syndrome. MRI showed dorsal column myelopathy    The studies below are medically necessary to evaluate differential which  includes NMO, MOG, connective tissue disorders, sarcoid (and other granulomatous disorders), autoimmune encephalitis/myelitis, tumor and infections and metabolic disorders.     Systemic sarcoid is unlikely given multiple normal CT chest & abdomen but CNS isolated sarcoid is high on the differential    She has done MRI's but not labs or EMG from last time    PLAN  EMG/NCS LE's  Labs below  May require repeat LP if no answer from blood tests    Orders today (details below)  Orders Placed This Encounter   Procedures    [place]    Encephalitis Antibody Evaluation with Reflex to Titer and Line Blot    Hemoglobin A1C    Serum Immunofixation Electrophoresis with Review    Myelin Associated Glycoprotein (MAG) Antibody with Reflex to MAG-SGPG and MAG, IgG, EIA    Sensory Motor Neuropathy Antibody Panel (Ganglioside)    Vitamin E    Zinc    DNA(ds) Antibody, Crithidia IFA with Reflex to Titer    Anti-Jo 1 Antibody, IgG    Scleroderma (SCL-70) Antibody    Histone Antibody    Angiotensin Converting Enzyme    Myeloperoxidase Antibody    Proteinase-3 Antibody    C Reactive Protein    Sedimentation Rate (ESR)    Rheumatoid Factor    Cyclic Citrullinated Peptide (CCP) Antibody, IgG    Cytomegalovirus (CMV) Antibody, IgM    Epstein-Barr Virus (EBV) VCA, IgM    Hepatitis B (HBV) Surface Antigen with Reflex to Confirmation    Hepatitis C Antibody, Total (Order)    HIV-1/2, Antigen and Antibody with Reflex to Confirmation (Order)    Quantiferon(R) - TB Gold Plus    Varicella Zoster Virus (VZV) Antibody, IgM    Syphilis Screen, IgG and IgM    HTLV I/II Antibody, with Reflex to Confirmatory Assay    TSH    Vitamin B6    Vitamin B12    Vitamin D, 25 OH, Total    CNS Demyelinating Disease Evaluation    Comprehensive Metabolic Panel    CBC with Differential (Order)    Infliximab Level and Anti-drug Antibody for Rheumatic Diseases    Methylmalonic Acid    EMG    Nerve conduction test     No orders of the defined types were placed in this  encounter.      I spent 40 minutes reviewing the records, talking with the patient and developing their care plan. This excludes time for separately billed procedures.      Lora Havens, MD PHD  Director, Neuroimmunology & MS Center  [facility]  [address], [address], [place], [place] [address]    Main line / scheduling: [phone]  After-hours Neurologist On Call: [phone]    For direct access, contact our MS Nurse, Allayne Stack RN  The fastest response is via mychart  Tel [phone]  Fax [phone]   ===================================================================  NFL (neurofilament light chain) Insurance Justification  Serum / plasma Neurofilament light chain is a well-established biomarker for MS relapses, and potentially for non-relapsing progressive disease as well (it is sensitive but not necessarily specific). It also has established utility in other CNS and peripheral nerve degenerative conditions. Recently, testing has become commercially available.  This data has emerged from multiple independent groups -- eg, [employer] working groups in [place] & [place]; [place], [place], and [place] reference laboratories; [employer]; and studies of MS cohorts at [employer]; and clinical trials for various MS therapies. NFL can improve upon the sensitivity of MRI (current standard of care) for detecting disease activity/progression. Further, most MS therapies have been shown to decrease sNFL levels, with higher efficacy therapies demonstrating greater reduction than lower efficacy therapies. Overall, when sNFL values are interpreted in context of patient's clinical situation, they are likely to impact management and treatment decisions.     PATIENT INSTRUCTIONS PROVIDED  Patient was given an "After Visit Summary" with a copy of the testing orders, medications and the following other instructions:  There are no Patient Instructions  on file for this visit.    IMPORTED INFORMATION FROM MEDICAL RECORDS   Diagnosis ICD-10-CM Associated Order   1. Subacute combined degeneration  E53.8 EMG    G32.0 Nerve conduction test      2. Sensory neuropathy  G62.9 [place]pper     Encephalitis Antibody Evaluation with Reflex to Titer and Line Blot     Hemoglobin A1C     Serum Immunofixation Electrophoresis with Review     Myelin Associated Glycoprotein (MAG) Antibody with Reflex to MAG-SGPG and MAG, IgG, EIA     Sensory Motor Neuropathy Antibody Panel (Ganglioside)     Vitamin E     Zinc     DNA(ds) Antibody, Crithidia IFA with Reflex to Titer     Anti-Jo 1 Antibody, IgG     Scleroderma (SCL-70) Antibody     Histone Antibody     Angiotensin Converting Enzyme     Myeloperoxidase Antibody     Proteinase-3 Antibody     C Reactive Protein     Sedimentation Rate (ESR)     Rheumatoid Factor     Cyclic Citrullinated Peptide (CCP) Antibody, IgG     Cytomegalovirus (CMV) Antibody, IgM     Epstein-Barr Virus (EBV) VCA, IgM     Hepatitis B (HBV) Surface Antigen with Reflex to Confirmation     Hepatitis C Antibody, Total (Order)     HIV-1/2, Antigen and Antibody with Reflex to Confirmation (Order)     Quantiferon(R) - TB Gold Plus     Varicella Zoster Virus (VZV) Antibody, IgM     Syphilis Screen, IgG and IgM     HTLV I/II Antibody, with Reflex to Confirmatory Assay     TSH     CNS Demyelinating Disease Evaluation     Comprehensive Metabolic Panel     CBC with Differential (Order)     Infliximab Level and Anti-drug Antibody for Rheumatic Diseases     Methylmalonic Acid     EMG     Nerve conduction test      3. Myelopathy  G95.9 Copper     Encephalitis Antibody Evaluation with Reflex to Titer and Line Blot  Hemoglobin A1C     Serum Immunofixation Electrophoresis with Review     Myelin Associated Glycoprotein (MAG) Antibody with Reflex to MAG-SGPG and MAG, IgG, EIA     Sensory Motor Neuropathy Antibody Panel (Ganglioside)     Vitamin E     Zinc     DNA(ds) Antibody, Crithidia  IFA with Reflex to Titer     Anti-Jo 1 Antibody, IgG     Scleroderma (SCL-70) Antibody     Histone Antibody     Angiotensin Converting Enzyme     Myeloperoxidase Antibody     Proteinase-3 Antibody     C Reactive Protein     Sedimentation Rate (ESR)     Rheumatoid Factor     Cyclic Citrullinated Peptide (CCP) Antibody, IgG     Cytomegalovirus (CMV) Antibody, IgM     Epstein-Barr Virus (EBV) VCA, IgM     Hepatitis B (HBV) Surface Antigen with Reflex to Confirmation     Hepatitis C Antibody, Total (Order)     HIV-1/2, Antigen and Antibody with Reflex to Confirmation (Order)     Quantiferon(R) - TB Gold Plus     Varicella Zoster Virus (VZV) Antibody, IgM     Syphilis Screen, IgG and IgM     HTLV I/II Antibody, with Reflex to Confirmatory Assay     TSH     CNS Demyelinating Disease Evaluation     Comprehensive Metabolic Panel     CBC with Differential (Order)     Infliximab Level and Anti-drug Antibody for Rheumatic Diseases     Methylmalonic Acid     EMG     Nerve conduction test      4. Vitamin B6 induced neuropathy  G62.0 Copper    T45.2X5A Encephalitis Antibody Evaluation with Reflex to Titer and Line Blot     Hemoglobin A1C     Serum Immunofixation Electrophoresis with Review     Myelin Associated Glycoprotein (MAG) Antibody with Reflex to MAG-SGPG and MAG, IgG, EIA     Sensory Motor Neuropathy Antibody Panel (Ganglioside)     Vitamin E     Zinc     DNA(ds) Antibody, Crithidia IFA with Reflex to Titer     Anti-Jo 1 Antibody, IgG     Scleroderma (SCL-70) Antibody     Histone Antibody     Angiotensin Converting Enzyme     Myeloperoxidase Antibody     Proteinase-3 Antibody     C Reactive Protein     Sedimentation Rate (ESR)     Rheumatoid Factor     Cyclic Citrullinated Peptide (CCP) Antibody, IgG     Cytomegalovirus (CMV) Antibody, IgM     Epstein-Barr Virus (EBV) VCA, IgM     Hepatitis B (HBV) Surface Antigen with Reflex to Confirmation     Hepatitis C Antibody, Total (Order)     HIV-1/2, Antigen and Antibody with  Reflex to Confirmation (Order)     Quantiferon(R) - TB Gold Plus     Varicella Zoster Virus (VZV) Antibody, IgM     Syphilis Screen, IgG and IgM     HTLV I/II Antibody, with Reflex to Confirmatory Assay     TSH     Vitamin B6     CNS Demyelinating Disease Evaluation     Comprehensive Metabolic Panel     CBC with Differential (Order)     Infliximab Level and Anti-drug Antibody for Rheumatic Diseases     Methylmalonic Acid     EMG     Nerve conduction  test      5. Vitamin B12 deficiency  E53.8 Copper     Encephalitis Antibody Evaluation with Reflex to Titer and Line Blot     Hemoglobin A1C     Serum Immunofixation Electrophoresis with Review     Myelin Associated Glycoprotein (MAG) Antibody with Reflex to MAG-SGPG and MAG, IgG, EIA     Sensory Motor Neuropathy Antibody Panel (Ganglioside)     Vitamin E     Zinc     DNA(ds) Antibody, Crithidia IFA with Reflex to Titer     Anti-Jo 1 Antibody, IgG     Scleroderma (SCL-70) Antibody     Histone Antibody     Angiotensin Converting Enzyme     Myeloperoxidase Antibody     Proteinase-3 Antibody     C Reactive Protein     Sedimentation Rate (ESR)     Rheumatoid Factor     Cyclic Citrullinated Peptide (CCP) Antibody, IgG     Cytomegalovirus (CMV) Antibody, IgM     Epstein-Barr Virus (EBV) VCA, IgM     Hepatitis B (HBV) Surface Antigen with Reflex to Confirmation     Hepatitis C Antibody, Total (Order)     HIV-1/2, Antigen and Antibody with Reflex to Confirmation (Order)     Quantiferon(R) - TB Gold Plus     Varicella Zoster Virus (VZV) Antibody, IgM     Syphilis Screen, IgG and IgM     HTLV I/II Antibody, with Reflex to Confirmatory Assay     TSH     Vitamin B12     CNS Demyelinating Disease Evaluation     Comprehensive Metabolic Panel     CBC with Differential (Order)     Infliximab Level and Anti-drug Antibody for Rheumatic Diseases     Methylmalonic Acid     EMG     Nerve conduction test      6. Vitamin D deficiency  E55.9 [place]     Encephalitis Antibody Evaluation with  Reflex to Titer and Line Blot     Hemoglobin A1C     Serum Immunofixation Electrophoresis with Review     Myelin Associated Glycoprotein (MAG) Antibody with Reflex to MAG-SGPG and MAG, IgG, EIA     Sensory Motor Neuropathy Antibody Panel (Ganglioside)     Vitamin E     Zinc     DNA(ds) Antibody, Crithidia IFA with Reflex to Titer     Anti-Jo 1 Antibody, IgG     Scleroderma (SCL-70) Antibody     Histone Antibody     Angiotensin Converting Enzyme     Myeloperoxidase Antibody     Proteinase-3 Antibody     C Reactive Protein     Sedimentation Rate (ESR)     Rheumatoid Factor     Cyclic Citrullinated Peptide (CCP) Antibody, IgG     Cytomegalovirus (CMV) Antibody, IgM     Epstein-Barr Virus (EBV) VCA, IgM     Hepatitis B (HBV) Surface Antigen with Reflex to Confirmation     Hepatitis C Antibody, Total (Order)     HIV-1/2, Antigen and Antibody with Reflex to Confirmation (Order)     Quantiferon(R) - TB Gold Plus     Varicella Zoster Virus (VZV) Antibody, IgM     Syphilis Screen, IgG and IgM     HTLV I/II Antibody, with Reflex to Confirmatory Assay     TSH     Vitamin D, 25 OH, Total     CNS Demyelinating Disease Evaluation     Comprehensive Metabolic  Panel     CBC with Differential (Order)     Infliximab Level and Anti-drug Antibody for Rheumatic Diseases     Methylmalonic Acid     EMG     Nerve conduction test        Orders Placed This Encounter   Procedures    Copper     Standing Status:   Future     Standing Expiration Date:   05/08/2024     Order Specific Question:   Release to patient     Answer:   Immediate    Encephalitis Antibody Evaluation with Reflex to Titer and Line Blot     Standing Status:   Future     Standing Expiration Date:   11/09/2023     Order Specific Question:   Release to patient     Answer:   Immediate    Hemoglobin A1C     Standing Status:   Future     Standing Expiration Date:   05/08/2024     Order Specific Question:   Release to patient     Answer:   Immediate    Serum Immunofixation  Electrophoresis with Review     Standing Status:   Future     Standing Expiration Date:   05/08/2024     Order Specific Question:   Release to patient     Answer:   Immediate    Myelin Associated Glycoprotein (MAG) Antibody with Reflex to MAG-SGPG and MAG, IgG, EIA     Standing Status:   Future     Standing Expiration Date:   05/08/2024     Order Specific Question:   Release to patient     Answer:   Immediate    Sensory Motor Neuropathy Antibody Panel (Ganglioside)     Standing Status:   Future     Standing Expiration Date:   05/08/2024     Order Specific Question:   Release to patient     Answer:   Immediate    Vitamin E     Standing Status:   Future     Standing Expiration Date:   05/08/2024     Order Specific Question:   Release to patient     Answer:   Immediate    Zinc     Standing Status:   Future     Standing Expiration Date:   05/08/2024     Order Specific Question:   Release to patient     Answer:   Immediate    DNA(ds) Antibody, Crithidia IFA with Reflex to Titer     Standing Status:   Future     Standing Expiration Date:   05/08/2024     Order Specific Question:   Release to patient     Answer:   Immediate    Anti-Jo 1 Antibody, IgG     Standing Status:   Future     Standing Expiration Date:   05/08/2024     Order Specific Question:   Release to patient     Answer:   Immediate    Scleroderma (SCL-70) Antibody     Standing Status:   Future     Standing Expiration Date:   05/08/2024     Order Specific Question:   Release to patient     Answer:   Immediate    Histone Antibody     Standing Status:   Future     Standing Expiration Date:   05/08/2024     Order  Specific Question:   Release to patient     Answer:   Immediate    Angiotensin Converting Enzyme     Standing Status:   Future     Standing Expiration Date:   05/08/2024     Order Specific Question:   Release to patient     Answer:   Immediate    Myeloperoxidase Antibody     Standing Status:   Future     Standing Expiration Date:   05/08/2024     Order Specific  Question:   Release to patient     Answer:   Immediate    Proteinase-3 Antibody     Standing Status:   Future     Standing Expiration Date:   05/08/2024     Order Specific Question:   Release to patient     Answer:   Immediate    C Reactive Protein     Standing Status:   Future     Standing Expiration Date:   05/08/2024     Order Specific Question:   Release to patient     Answer:   Immediate    Sedimentation Rate (ESR)     Standing Status:   Future     Standing Expiration Date:   05/08/2024     Order Specific Question:   Release to patient     Answer:   Immediate    Rheumatoid Factor     Standing Status:   Future     Standing Expiration Date:   05/08/2024     Order Specific Question:   Release to patient     Answer:   Immediate    Cyclic Citrullinated Peptide (CCP) Antibody, IgG     Standing Status:   Future     Standing Expiration Date:   05/08/2024     Order Specific Question:   Release to patient     Answer:   Immediate    Cytomegalovirus (CMV) Antibody, IgM     Standing Status:   Future     Standing Expiration Date:   05/08/2024     Order Specific Question:   Release to patient     Answer:   Immediate    Epstein-Barr Virus (EBV) VCA, IgM     Standing Status:   Future     Standing Expiration Date:   05/08/2024     Order Specific Question:   Release to patient     Answer:   Immediate    Hepatitis B (HBV) Surface Antigen with Reflex to Confirmation     Standing Status:   Future     Standing Expiration Date:   05/08/2024     Order Specific Question:   Release to patient     Answer:   Immediate    Hepatitis C Antibody, Total (Order)     Standing Status:   Future     Standing Expiration Date:   05/08/2024     Order Specific Question:   Release to patient     Answer:   Immediate    HIV-1/2, Antigen and Antibody with Reflex to Confirmation (Order)     Standing Status:   Future     Standing Expiration Date:   05/08/2024     Order Specific Question:   Release to patient     Answer:   Immediate    Quantiferon(R) - TB Gold Plus      Standing Status:   Future     Standing Expiration Date:  05/08/2024     Order Specific Question:   Release to patient     Answer:   Immediate    Varicella Zoster Virus (VZV) Antibody, IgM     Standing Status:   Future     Standing Expiration Date:   05/08/2024     Order Specific Question:   Release to patient     Answer:   Immediate    Syphilis Screen, IgG and IgM     Standing Status:   Future     Standing Expiration Date:   11/09/2023     Order Specific Question:   Release to patient     Answer:   Immediate    HTLV I/II Antibody, with Reflex to Confirmatory Assay     Standing Status:   Future     Standing Expiration Date:   05/08/2024     Order Specific Question:   Release to patient     Answer:   Immediate    TSH     Standing Status:   Future     Standing Expiration Date:   05/08/2024     Order Specific Question:   Release to patient     Answer:   Immediate    Vitamin B6     Standing Status:   Future     Standing Expiration Date:   05/08/2024     Order Specific Question:   Release to patient     Answer:   Immediate    Vitamin B12     Standing Status:   Future     Standing Expiration Date:   05/08/2024     Order Specific Question:   Release to patient     Answer:   Immediate    Vitamin D, 25 OH, Total     Standing Status:   Future     Standing Expiration Date:   05/08/2024     Order Specific Question:   Release to patient     Answer:   Immediate    CNS Demyelinating Disease Evaluation     Standing Status:   Future     Standing Expiration Date:   05/08/2024     Order Specific Question:   Release to patient     Answer:   Immediate    Comprehensive Metabolic Panel     Standing Status:   Future     Standing Expiration Date:   05/08/2024     Order Specific Question:   Has the patient fasted?     Answer:   No     Order Specific Question:   Release to patient     Answer:   Immediate    CBC with Differential (Order)     Standing Status:   Future     Standing Expiration Date:   05/08/2024     Order Specific Question:   Release to  patient     Answer:   Immediate    Infliximab Level and Anti-drug Antibody for Rheumatic Diseases     Standing Status:   Future     Standing Expiration Date:   05/08/2024     Order Specific Question:   Release to patient     Answer:   Immediate    Methylmalonic Acid     Standing Status:   Future     Standing Expiration Date:   05/08/2024     Order Specific Question:   Release to patient     Answer:   Immediate  EMG     Standing Status:   Future     Standing Expiration Date:   05/08/2024     Order Specific Question:   Reason for Exam     Answer:   both legs. dorsal column myelopathy. evaluate for subacute combined degeneration. ?neuropathy in legs.    Nerve conduction test     Standing Status:   Future     Standing Expiration Date:   05/08/2024     Order Specific Question:   Reason for Exam     Answer:   both legs. dorsal column myelopathy. evaluate for subacute combined degeneration. ?neuropathy in legs.

## 2023-05-30 ENCOUNTER — Other Ambulatory Visit: Payer: Self-pay | Admitting: Neurology

## 2023-05-31 LAB — COMPREHENSIVE METABOLIC PANEL
ALT: 5 IU/L (ref 0–32)
AST (SGOT): 12 IU/L (ref 0–40)
Albumin: 4.2 g/dL (ref 3.9–4.9)
Alkaline Phosphatase: 95 IU/L (ref 44–121)
BUN / Creatinine Ratio: 11 — ABNORMAL LOW (ref 12–28)
BUN: 12 mg/dL (ref 8–27)
Bilirubin, Total: 0.2 mg/dL (ref 0.0–1.2)
CO2: 27 mmol/L (ref 20–29)
Calcium: 9.5 mg/dL (ref 8.7–10.3)
Chloride: 100 mmol/L (ref 96–106)
Creatinine: 1.08 mg/dL — ABNORMAL HIGH (ref 0.57–1.00)
Globulin, Total: 2.6 g/dL (ref 1.5–4.5)
Glucose: 94 mg/dL (ref 70–99)
Potassium: 4.2 mmol/L (ref 3.5–5.2)
Protein, Total: 6.8 g/dL (ref 6.0–8.5)
Sodium: 141 mmol/L (ref 134–144)
eGFR: 57 mL/min/{1.73_m2} — ABNORMAL LOW (ref >59)

## 2023-05-31 LAB — AUTOIMMUNE ENCEPHALOPATHY PROFILE
AGNA-1: NEGATIVE
AMPA-R1 Antibody IFA: NEGATIVE
AMPA-R2 Antibody IFA: NEGATIVE
ANNA-3: NEGATIVE
Amphiphysin Antibody: NEGATIVE
Autoimmune Interpretation: NEGATIVE
CASPR2 IgG CBA: NEGATIVE
CRMP-5 IgG: NEGATIVE
DNER Antibody: NEGATIVE
DPPX Antibody IFA: NEGATIVE
GABA-B-R Antibody: NEGATIVE
GAD65 Antibody Assay: NEGATIVE
Hu Antibody, IFA: NEGATIVE
ITPR1 IFA: NEGATIVE
IgLON5 IFA: NEGATIVE
LGI1 IgG CBA: NEGATIVE
MA2/TA Antibody,IFA: NEGATIVE
NMDA-R Antibody IFA: NEGATIVE
PCA-2: NEGATIVE
PCA-Tr: NEGATIVE
RI AB, IFA, Serum: NEGATIVE
Yo AB, IFA: NEGATIVE
Zic4 Antibody: NEGATIVE
mGluR1 Antibody IFA: NEGATIVE

## 2023-05-31 LAB — AMBIG ABBREV CBC/DIFF DEFAULT
Baso(Absolute): 0 10*3/uL (ref 0.0–0.2)
Basophils Automated: 1 %
Eosinophils Absolute: 0.3 10*3/uL (ref 0.0–0.4)
Eosinophils Automated: 7 %
Hematocrit: 32.8 % — ABNORMAL LOW (ref 34.0–46.6)
Hemoglobin: 10.7 g/dL — ABNORMAL LOW (ref 11.1–15.9)
Immature Granulocytes Absolute: 0 10*3/uL (ref 0.0–0.1)
Immature Granulocytes: 0 %
Lymphocytes Absolute: 2.3 10*3/uL (ref 0.7–3.1)
Lymphocytes Automated: 46 %
MCH: 26.3 pg — ABNORMAL LOW (ref 26.6–33.0)
MCHC: 32.6 g/dL (ref 31.5–35.7)
MCV: 81 fL (ref 79–97)
Monocytes Absolute: 0.3 10*3/uL (ref 0.1–0.9)
Monocytes: 7 %
Neutrophils Absolute Count: 1.9 10*3/uL (ref 1.4–7.0)
Neutrophils: 39 %
Platelets: 308 10*3/uL (ref 150–450)
RBC: 4.07 x10E6/uL (ref 3.77–5.28)
RDW: 13.2 % (ref 11.7–15.4)
WBC: 4.9 10*3/uL (ref 3.4–10.8)

## 2023-05-31 LAB — C-REACTIVE PROTEIN: C-Reactive Protein: 13 mg/L — ABNORMAL HIGH (ref 0–10)

## 2023-05-31 LAB — SEDIMENTATION RATE: Sed Rate: 26 mm/hr (ref 0–40)

## 2023-05-31 LAB — EPSTEIN-BARR VIRUS (EBV) VCA, IGM: EBV VCA Ab, IgM: <36 U/mL (ref 0.0–35.9)

## 2023-05-31 LAB — SCLERODERMA (SCL-70) ANTIBODY: SCL-70 ANTIBODY: <0.2 AI (ref 0.0–0.9)

## 2023-05-31 LAB — HIV-1/2, ANTIGEN AND ANTIBODY WITH REFLEX TO CONFIRMATION: HIV Screen 4th Generation wRfx: NONREACTIVE

## 2023-05-31 LAB — VITAMIN B12: Vitamin B-12: 452 pg/mL (ref 232–1245)

## 2023-05-31 LAB — CYTOMEGALOVIRUS (CMV) ANTIBODY, IGM: CMV, IgM: <30 AU/mL (ref 0.0–29.9)

## 2023-05-31 LAB — HEPATITIS B (HBV) SURFACE ANTIGEN WITH REFLEX TO CONFIRMATION: Hepatitis B Surface Antigen: NEGATIVE

## 2023-05-31 LAB — RHEUMATOID FACTOR: RA Latex Turbid.: <10 IU/mL (ref <14.0)

## 2023-05-31 LAB — SPECIMEN STATUS REPORT

## 2023-05-31 LAB — HISTONE ANTIBODY: HISTONE ANTIBODY: 0.4 Units (ref 0.0–0.9)

## 2023-05-31 LAB — TSH: TSH: 0.907 u[IU]/mL (ref 0.450–4.500)

## 2023-05-31 LAB — HCV TOTAL: HCV AB: NONREACTIVE

## 2023-05-31 LAB — VITAMIN D, 25 OH, TOTAL: Vitamin D 25-Hydroxy: 13.6 ng/mL — ABNORMAL LOW (ref 30.0–100.0)

## 2023-05-31 LAB — ANTI-JO 1 ANTIBODY, IGG: Anti-Jo-1: <0.2 AI (ref 0.0–0.9)

## 2023-05-31 LAB — DOUBLE STRANDED DNA (DSDNA) ANTIBODY: Anti-DNA (DS) Antibody Quantitative: <1 IU/mL (ref 0–9)

## 2023-05-31 LAB — HEMOGLOBIN A1C: Hemoglobin A1C: 6 % — ABNORMAL HIGH (ref 4.8–5.6)

## 2023-05-31 LAB — ANGIOTENSIN CONVERTING ENZYME: Angiotensin-Converting Enzyme: 20 U/L (ref 14–82)

## 2023-06-01 LAB — COPPER: Copper: 149 ug/dL (ref 80–158)

## 2023-06-01 LAB — CNS DEMYELINATING DISEASE EVALUATION
Aquaporin-4 Antibody IgG, CBA: NEGATIVE
MOG Antibody, Cell-based IFA: NEGATIVE

## 2023-06-01 LAB — MYELOPEROXIDASE LEVEL (MPO): Myeloperoxidase Level: 457 pmol/L (ref 0–469)

## 2023-06-01 LAB — ZINC: Zinc: 77 ug/dL (ref 44–115)

## 2023-06-01 LAB — HTLV-I/-II ANTIBODY SCREEN: HTLV-I/II Antibodies, Qual: NEGATIVE

## 2023-06-01 LAB — PROTEINASE 3 AB (PR3), S: ANCA Proteinase 3: <0.2 units (ref 0.0–0.9)

## 2023-06-01 LAB — CCP ANTIBODIES IGG/IGA: CCP Antibodies IgG/IgA: 6 units (ref 0–19)

## 2023-06-01 LAB — TREPONEMA PALLIDUM SCREENING CASCADE: T pallidum Antibodies: NONREACTIVE

## 2023-06-02 LAB — QUANTIFERON(R) - TB GOLD PLUS
QuantiFERON Mitogen Value: >10 IU/mL
QuantiFERON Nil Value: 0.06 IU/mL
Quantiferon TB Gold Plus: NEGATIVE
Quantiferon TB1 Ag Value: 0.06 IU/mL
Quantiferon TB2 Ag Value: 0.05 IU/mL

## 2023-06-02 LAB — VITAMIN E
Vitamin E (Alpha Tocopherol): 11.7 mg/L (ref 9.0–29.0)
Vitamin E (GAMMA TOCOPHEROL): 3.4 mg/L (ref 0.5–4.9)

## 2023-06-02 LAB — VARICELLA ZOSTER VIRUS (VZV) ANTIBODY, IGM: Varicella IgM: <0.91 index (ref 0.00–0.90)

## 2023-06-02 LAB — MYELIN ASSOCIATED GLYCOPROTEIN (MAG) ANTIBODY, IGM, SERUM: MAG Antibody IgM, EIA: <900 BTU (ref 0–999)

## 2023-06-02 LAB — VITAMIN B6: Vitamin B6: 3.6 ug/L (ref 3.4–65.2)

## 2023-06-02 LAB — MAG IGM INTERPRETATION

## 2023-06-03 LAB — SENSORY NEUROPATHY ANTIBODY PANEL WITH REFLEX
MAG Antibody IgM, EIA: <1000 TU (ref 0–999)
Purkinje Cell Neuronal Nuclear IgG: NOT DETECTED
SGPG IgM: 0.04 IV (ref 0.00–0.99)

## 2023-06-05 LAB — IMMUNIFIXATION, SERUM
IgM: 52 mg/dL (ref 26–217)
Immunoglobulin A: 477 mg/dL — ABNORMAL HIGH (ref 87–352)
Total IgG: 1114 mg/dL (ref 586–1602)

## 2023-06-05 LAB — METHYLMALONIC ACID: Methylmalonic acid, S: 236 nmol/L (ref 0–378)

## 2023-06-05 NOTE — H&P (Signed)
[facility]  [facility]    History and Physical  06/05/2023 6:15 PM      THE HOSPITALIST TEAM PREFERS TO USE EPIC CHAT FOR COMMUNICATION 7AM-7PM. IF I DO NOT RESPOND WITHIN 15 MINUTES, PLEASE PAGE ME/CALL THROUGH THE OPERATOR. FROM 7PM-7AM, PLEASE PAGE     Assessment and Plan   67 year old female with history of hypertension, hyperlipidemia, atrial fibrillation on Eliquis, TIA, type 2 diabetes and stiff syndrome, presented to the emergency department for chest pain me.    # Chest pain on exertion most likely due to stiff syndrome or MSK  # Atrial fibrillation on Eliquis  # History of hypertension  # Type 2 diabetes mellitus  # History of transient ischemic attack  # History of hyperlipidemia  # History of stiff person syndrome     Plan  - admit to MedSurg with telemetry  - routine vitals  - consult cardiology  - npo after midnight until cardiology consult  - initial troponin at 9 but will trend troponin  - check mg, phos, A1c in a.m.  - follow up cbc, cmp in a,m.  - check ESR, UDS  - thyroid stimulating hormone normal at 1.23  - will hold off on ivf , bilateral lower extremity swelling  - continue home medications when reconcile  - corrective insulin per sliding scale  - analgesics for pain prn  - give benadryl with dilaudid for itching    # Anemia: chronic  - hemoglobin 10.8 (06/05/23)  - cbc in a.m.    # Paroxysmal atrial fibrillation  - continue Eliquis  - continue home metoprolol for rate control     # Diabetes Mellitus, Type 2  - insulin lispro 1-6 u sc qid     # GERD without esophagitis  - start omeprazole 40 mg po daily  - hold home pantoprazole 40 mg po     # Hypertension  - continue home metoprolol, torsemide     # Hyperlipidemia  - not on statin defer to cardiology    # Stiff person syndrome  - continue home requip , gabapentin    # History of seizure like activity  - Continue lamictal daily    VTE Prophylaxis:   Eliquis    Chief Complaint    No chief  complaint on file.       History of Present Illness   [name] is an 67 y.o. female who came from [facility] with history of hypertension, hyperlipidemia, atrial fibrillation on eliquis, TIA, type 2 DM, presented to the emergency department with complaint of chest pain that started 2 days ago.Patient stated chest pain gets worse with exertion, does improve with rest. Patient denies injury, trauma, fall, lifting or straining. Patient believes chest pain may be due to stiff person syndrome. In the emergency room twelve lead EKG shows normal sinus rhythm, rate 79 bpm, nonspecific ST segment changes. CBC shows no leukocytosis, does show mild anemia. BMP shows no significant electrolyte abnormalities. LFT show no significant hepatic injury. TSH within normal limits. Troponin and EKG show no evidence of STEMI or NSTEMI. Chest x-ray shows no evidence of acute cardiopulmonary disease. On exam patient endorsing chest pain. Patient fever, chills, shortness of breath, nausea, vomiting, diarrhea, headache. Admit to inpaient for further evaluation and management.      Past Medical History     Past Medical History:   Diagnosis Date   . Acute, but ill-defined, cerebrovascular disease  TIA   . Arthropathy     knees   . Asthma    . Bipolar disorder (HCC)    . Cardiac arrhythmia     h/o Afib on Eliquis   . Cerebral artery occlusion with cerebral infarction [facility])     CVA post endodartorectomy   . Chest pain     has history multiple admissions for this, stress test and cardiac cath negative   . Chronic anemia     Hgb ~10   . COPD (chronic obstructive pulmonary disease) (HCC)    . Cough    . COVID    . COVID-19 vaccine series completed    . CVA (cerebral vascular accident) (HCC) 09/01/2021   . Deficiency anemia 11/2020    rx with Iron Infusions   . Epilepsy (HCC)     per pt last Sz was approx 9-10 months ago   . Esophageal reflux    . Headache    . 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 Hemorrhoids    . History of blood transfusion     following one of her pregnancy   . History of cardiac catheterization no obstructive cad in 2017    . Hyperlipidemia    . Hyperlipidemia    . Hypertension    . Lower leg edema    . Migraines    . Paroxysmal atrial fibrillation (HCC)     on anticoagulation   . Seizure-like activity (HCC)     EEG negative   . Stiff person syndrome    . Syncope     recurrent, has loop recorder   . Thoracic aortic aneurysm (HCC)    . TIA (transient ischemic attack)     multiple since age 46's per patient. Multiple MRI brain in the past negative for evidence of stroke. Suspected complicated migraine.   . Type 2 diabetes mellitus (HCC)     not on medication   . Unable to coordinate sucking, swallowing, and breathing 09/28/2019       Past Surgical History     Past Surgical History:   Procedure Laterality Date   . BREAST BIOPSY Right 06/18/2021    right ultrasound core biopsy with clip placed   . CARDIAC CATH  2016, 02/2016   . CAROTID ENDARTERECTOMY     . COLONOSCOPY N/A 07/01/2013    Procedure: COLONOSCOPY;  Surgeon: Earlean Shawl, MD;  Location: [facility] ENDO OR LOC;  Service: Endoscopy GI;  Laterality: N/A;   . COLONOSCOPY N/A 04/13/2015    Procedure: COLONOSCOPY FLEXIBLE;  Surgeon: Posey Rea, MD;  Location: [facility] ENDO OR LOC;  Service: Endoscopy GI;  Laterality: N/A;   . COLONOSCOPY WITH BIOPSY N/A 04/13/2015    Procedure: COLONOSCOPY FLEXIBLE WITH BIOPSY;  Surgeon: Posey Rea, MD;  Location: [facility] ENDO OR LOC;  Service: Endoscopy GI;  Laterality: N/A;   . COLONOSCOPY WITH BIOPSY N/A 01/31/2017    Procedure: COLONOSCOPY FLEXIBLE WITH BIOPSY;  Surgeon: Brett Canales, MD   . COLONOSCOPY WITH BIOPSY N/A 05/29/2017    Procedure: COLONOSCOPY, WITH BIOPSY;  Surgeon: Gretta Cool, MD   . COLONOSCOPY WITH BIOPSY N/A 05/29/2017    Procedure: COLONOSCOPY, WITH BIOPSY;  Surgeon: Gretta Cool, MD   . EGD N/A 07/01/2013    Procedure:  ESOPHAGOGASTRODUODENOSCOPY;  Surgeon: Earlean Shawl, MD;  Location: [facility] ENDO OR LOC;  Service: Endoscopy GI;  Laterality: N/A;   . EGD N/A 08/08/2014    Procedure: ESOPHAGOGASTRODUODENOSCOPY  FLEXIBLE TRANSORAL DIAGNOSTIC;  Surgeon: Phylis Bougie, MD;  Location: [facility] ENDO OR LOC;  Service: Endoscopy GI;  Laterality: N/A;   . EGD WITH BIOPSY N/A 06/07/2016    Procedure: ESOPHAGOGASTRODUODENOSCOPY FLEXIBLE TRANSORAL WITH BIOPSY;  Surgeon: Brett Canales, MD   . EGD WITH BIOPSY N/A 03/20/2020    Procedure: EGD, WITH BIOPSY;  Surgeon: Gretta Cool, MD   . EGD WITH BLEEDING CONTROL N/A 05/29/2017    Procedure: EGD, WITH HEMORRHAGE CONTROL;  Surgeon: Gretta Cool, MD   . EXCISION OF BENIGN LESION Right 10/19/2021    Procedure: EXCISION, LESION, BENIGN, BREAST;  Surgeon: Eulis Manly, MD   . HEART CATHETERIZATION     . HYSTERECTOMY     . LOOP ELECTROSURGICAL EXCISON PROCEDURE     . LOOP RECORDER  2018   . OTHER Right 06/18/2021    axillary lymph node biopsy with clip placed   . OTHER  05/2021    Left leg ablation procedure   . VENOUS ACCESS CATHETER INSERTION Right 03/29/2019       Social History     Social History     Socioeconomic History   . Marital status: Divorced     Spouse name: Not on file   . Number of children: Not on file   . Years of education: Not on file   . Highest education level: Not on file   Occupational History   . Not on file   Tobacco Use   . Smoking status: Never     Passive exposure: Never   . Smokeless tobacco: Never   Vaping Use   . Vaping status: Never Used   Substance and Sexual Activity   . Alcohol use: No     Alcohol/week: 0.0 standard drinks of alcohol   . Drug use: No   . Sexual activity: Not Currently   Other Topics Concern   . Back Care Not Asked   . Bike Helmet Not Asked   . Blood Transfusions Not Asked   . Caffeine Concern Not Asked   . Counseling Not Asked   . Depression Concerns Not Asked   . Depression Screening Not Asked   . Exercise Not Asked   . Hobby Hazards Not Asked    . Military Service Not Asked   . Occupational Exposure Not Asked   . Seat Belt Not Asked   . Self-Exams Not Asked   . Sleep Concern Not Asked   . Smoke Detectors Not Asked   . Smoking Concerns Not Asked   . Smoking Cessation Not Asked   . Special Diet Not Asked   . Stress Concern Not Asked   . Weight Concern Not Asked   . Do you use a bike helmet? Not Asked   . Do you use caffeine daily? Not Asked   . Do you exercise? Not Asked   . Are there hazards related to your hobbies? Not Asked   . Military Service Not Asked   . Do you have any work place hazards? Not Asked   . Do you wear a seat belt while in a moving vehicle? Not Asked   . Do you have sleep concerns? Not Asked   . Do you follow a special diet? Not Asked   . Are you concerned with your weight? Not Asked   Social History Narrative    DNR/DNI    MPOA - her daughter Maureen Chatters     Social Determinants of Health  Financial Resource Strain: Low Risk  (05/31/2023)    Overall Financial Resource Strain (CARDIA)    . Difficulty of Paying Living Expenses: Not hard at all   Food Insecurity: No Food Insecurity (05/31/2023)    Hunger Vital Sign    . Worried About Programme researcher, broadcasting/film/video in the Last Year: Never true    . Ran Out of Food in the Last Year: Never true   Transportation Needs: No Transportation Needs (05/31/2023)    PRAPARE - Transportation    . Lack of Transportation (Medical): No    . Lack of Transportation (Non-Medical): No   Physical Activity: Insufficiently Active (05/31/2023)    Exercise Vital Sign    . Days of Exercise per Week: 1 day    . Minutes of Exercise per Session: 10 min   Stress: Stress Concern Present (05/31/2023)    [employer] of Occupational Health - Occupational Stress Questionnaire    . Feeling of Stress : Rather much   Social Connections: Moderately Integrated (05/31/2023)    Social Connection and Isolation Panel [NHANES]    . Frequency of Communication with Friends and Family: More than three times a week    . Frequency of Social Gatherings  with Friends and Family: Never    . Attends Religious Services: More than 4 times per year    . Active Member of Clubs or Organizations: Yes    . Attends Banker Meetings: More than 4 times per year    . Marital Status: Divorced   Catering manager Violence: Not At Risk (05/31/2023)    Humiliation, Afraid, Rape, and Kick questionnaire    . Fear of Current or Ex-Partner: No    . Emotionally Abused: No    . Physically Abused: No    . Sexually Abused: No   Housing Stability: Low Risk  (05/31/2023)    Housing Stability Vital Sign    . Unable to Pay for Housing in the Last Year: No    . Number of Times Moved in the Last Year: 0    . Homeless in the Last Year: No        Family History     Family History   Problem Relation Age of Onset   . Hypertension Mother    . Diabetes Mother    . Hypertension Sister    . Diabetes Brother    . Hypertension Brother    . Other Family History Father         Unknown to patient   . Anesthesia Reaction Neg Hx        Allergies     Allergies   Allergen Reactions   . Montelukast rash/itching and swelling   . Nitroglycerin hives and rash/itching   . Immune Globulin (Human) (Igg) swelling and rash/itching     Pt complain of itchiness and swelling of lips and tongue. Saturation fine, bp elevated.   . Magnesium Sulfate rash/itching   . Amoxicillin hives     Has tolerated ceftriaxone, cephalexin   . Atorvastatin unknown and other/intolerance     Rhabdomyolysis   Pt states she thinks it caused her to have rhabdo   . Bumex [Bumetanide] muscle/joint pain   . Ceftriaxone rash/itching   . Magnesium Chloride rash/itching   . Iodinated Contrast Media swelling     Throat swelling and lip itching   . Penicillins hives     Has tolerated cephalexin   . Tetracyclines hives   . Latex, Natural Rubber rash/itching and  hives   . Kit Prep Of Tc-44m-Tetrofosmin unknown        Prior to Admission Medications     Prior to Admission Medications   Prescriptions Last Dose Patient Reported? Taking?   Ammonium  Lactate 12 % Top CREA Unknown at Unknown Yes Yes   Sig: PLEASE SEE ATTACHED FOR DETAILED DIRECTIONS   EPINEPHrine (EPIPEN) 0.3 mg/0.3 mL Inj AtIn PRN at Unknown No Yes   Sig: Inject 0.3 mL into the muscle Take As Needed for Other (ANAPHYLAXIS). Q 10-30 minutes prn for ANAPHYLAXIS ONLY   apixaban (ELIQUIS) 5 mg PO TABS 06/05/2023 at 0900 Yes Yes   Sig: Take 1 Tab by Mouth Twice Daily.   diazePAM (VALIUM) 5 mg PO TABS 06/05/2023 at 0900 Yes Yes   Sig: Take 1 Tab by Mouth 2 (two) times a day.   diclofenac sodium 1 % Top GEL 06/05/2023 at 0900 No Yes   Sig: USE 4 GRAM TO AFFECTED AREA 4 TIMES DAILY.   gabapentin (NEURONTIN) 100 mg PO CAPS 06/05/2023 at 0900 Yes Yes   Sig: Take 1 Cap by Mouth 3 Times Daily.   hydrOXYzine pamoate (VISTARIL) 50 mg PO CAPS PRN at Unknown Yes Yes   Sig: Take 1 Cap by Mouth 2 Times Daily As Needed.   lamoTRIgine (LAMICTAL) 100 mg PO TABS 06/05/2023 at 0900 Yes Yes   Sig: Take 1 Tab by Mouth Once a Day. Indications: epilepsy   lurasidone (LATUDA) 40 mg PO TABS 06/05/2023 at 0900 Yes Yes   Sig: TAKE 1 TABLET DAILY WITH FOOD (AT LEAST 350 CALORIES)   lurasidone 120 mg PO TABS 06/05/2023 at 0900 Yes Yes   Sig: Take 1 Tab by Mouth Once a Day.   metoprolol XL (TOPROL XL) 25 mg PO TB24 06/05/2023 at 0900 Yes Yes   Sig: Take 1 Tab by Mouth Once a Day.   pantoprazole (PROTONIX) 40 mg PO TBEC 06/05/2023 at 0900 No Yes   Sig: take 1 tablet by mouth twice a day   potassium chloride (KLOR-CON) 10 mEq PO TbSR 06/05/2023 at 0900 Yes Yes   Sig: Take 1 Tab by Mouth 3 Times Daily.   rOPINIRole (REQUIP) 0.5 mg PO TABS 06/04/2023 at 2100 No Yes   Sig: TAKE 2 TABS BY MOUTH EVERY NIGHT AT BEDTIME.   torsemide (DEMADEX) 20 mg PO TABS 06/05/2023 at 0900 Yes Yes   Sig: Take 1 Tab by Mouth Twice Daily.   traMADoL (ULTRAM) 50 mg PO TABS PRN at Unknown No Yes   Sig: Take 1 Tab by Mouth 2 Times Daily As Needed. for pain      Facility-Administered Medications: None         Review of System and Physical Exam      BP: 126/59  Pulse:  67  Temp: 98.5 F (36.9 C)  Resp: 16  SpO2: 100 %    Temp (24hrs), Avg:98.8 F (37.1 C), Min:98.5 F (36.9 C), Max:99.1 F (37.3 C)            Physical Exam  Vitals reviewed.   Constitutional:       General: She is not in acute distress.  HENT:      Head: Normocephalic and atraumatic.      Mouth/Throat:      Mouth: Mucous membranes are moist.   Eyes:      Conjunctiva/sclera: Conjunctivae normal.   Cardiovascular:      Heart sounds: Normal heart sounds.   Pulmonary:  Breath sounds: Normal breath sounds.   Abdominal:      General: Bowel sounds are normal.   Musculoskeletal:      Cervical back: Neck supple.      Right lower leg: Edema present.      Left lower leg: Edema present.   Skin:     General: Skin is warm and dry.   Neurological:      Mental Status: She is alert and oriented to person, place, and time.   Psychiatric:         Mood and Affect: Mood normal.                     Review of Systems   Constitutional:  Negative for chills and fever.   HENT: Negative.     Eyes: Negative.    Respiratory: Negative.  Negative for shortness of breath.    Cardiovascular:  Positive for chest pain.   Gastrointestinal:  Negative for abdominal pain, nausea and vomiting.   Endocrine: Negative.    Genitourinary: Negative.    Musculoskeletal: Negative.    Skin: Negative.    Neurological:  Negative for dizziness, weakness and headaches.   Psychiatric/Behavioral: Negative.            Problem List     Patient Active Problem List   Diagnosis   . Essential hypertension   . Paroxysmal atrial fibrillation (HCC)   . Bipolar 1 disorder (HCC)   . Iron deficiency anemia due to chronic blood loss   . Hypokalemia   . Chronic obstructive pulmonary disease (HCC)   . Pure hypercholesterolemia   . Gastroesophageal reflux disease without esophagitis   . Chronic headache   . History of nuclear stress test in 05/2108   . History of cardiac catheterization no obstructive cad in 2017    . Transient neurologic deficit   . TIA (transient ischemic  attack)   . Stable angina pectoris (HCC)   . Type 2 diabetes mellitus with diabetic neuropathy, without long-term current use of insulin (HCC)   . Abnormal EKG   . Vertigo   . Slurred speech   . Headache   . Weakness of left upper extremity   . Progressive focal motor weakness   . History of cardiac cath no obstructive cad in cath in 2014 and 2017   . Hx of TIA (transient ischemic attack) and stroke 08/2016   . Right sided weakness   . Other headache syndrome   . Acute chest pain   . Atypical chest pain   . Left-sided weakness   . Seizure (HCC)   . Chronic anemia   . Bleeding per rectum   . Syncope   . Iron deficiency anemia, unspecified   . Migraine   . Facial droop   . Left hand paresthesia   . Fall   . Unable to coordinate sucking, swallowing, and breathing   . Left sided numbness   . Chronic diastolic (congestive) heart failure (HCC)   . Acute focal neurological deficit   . Weakness   . Intractable headache   . Other fatigue   . Chest pain   . Facial paresthesia   . Chest pain, unspecified type   . Acute renal failure (ARF) (HCC)   . ARF (acute renal failure) (HCC)   . Aortic root dilation (HCC)   . Non-traumatic rhabdomyolysis   . Paresthesia   . Acute encephalopathy   . Acute on chronic anemia   . UTI (urinary tract  infection)   . Suprapubic catheter dysfunction, initial encounter (HCC)   . Malfunction of indwelling urinary catheter (HCC)   . Syncope and collapse   . Neuropathy   . Neurogenic bladder        -----  Focus of this inpatient stay will remain on problems that need acute care setting for care.      We will review available studies and will order additional labs, imaging and other studies as appropriate.     As needed medicines are ordered as appropriate.     VTE Prophylaxis will be ordered as appropriate.     Please see above for management plan for individual hospital problems.     Home medications are reviewed and will be continued as appropriate.     Patient will be continued to be followed  during this hospital stay by a member of [facility].        Labs     Results for orders placed or performed during the hospital encounter of 06/05/23 (from the past 24 hour(s))   GLUCOSE SCREEN   Result Value Ref Range    Glucose POC 89 70 - 99 mg/dL         Imaging     Recent Results (from the past 36 hour(s))   1. EKG 12-LEAD   Result Value Ref Range    Heart Rate 79 bpm    RR Interval 760 ms    Atrial Rate 79 ms    P-R Interval 168 ms    P Duration 116 ms    P Horizontal Axis -2 deg    P Front Axis 40 deg    Q Onset 504 ms    QRSD Interval 85 ms    QT Interval 401 ms    QTcB 460 ms    QTcF 440 ms    QRS Horizontal Axis -2 deg    QRS Axis 41 deg    I-40 Front Axis 52 deg    t-40 Horizontal Axis -9 deg    T-40 Front Axis 36 deg    T Horizontal Axis  deg    T Wave Axis  deg    S-T Horizontal Axis 177 deg    S-T Front Axis 255 deg    Impression - BORDERLINE ECG -     Impression SR-Sinus rhythm-normal P axis, V-rate 50-99     Impression -Borderline T wave abnormalities, diffuse leads -     Impression       -No significant change since prior tracing, 01/22/2023-   2. CHEST PORTABLE - Chest Pain    Narrative    CLINICAL DATA:  Chest pain.    EXAM:  PORTABLE CHEST 1 VIEW    COMPARISON:  Radiographs 01/12/2023 and 12/29/2022.  CT 06/26/2021.    FINDINGS:  1320 hours. The heart size and mediastinal contours are stable with  mild aortic atherosclerosis. The lungs are clear. There is no  pleural effusion or pneumothorax. No acute osseous findings are  evident. Mild degenerative changes at both shoulders.      Impression    IMPRESSION:  No evidence of active cardiopulmonary process. Aortic  atherosclerosis.      Electronically Signed    By: Carey Bullocks M.D.    On: 06/05/2023 13:33          Signed   Lestine Box, NP     Hospital Medicine  06/05/2023  6:15 PM

## 2023-06-05 NOTE — ED Provider Notes (Signed)
[facility]   --------------------------------------------------------------------------------------------------------------    Time of Arrival:   06/05/23 1311      Chief Complaint   Patient presents with   . CHEST PAIN (ADULT)       HPI  HPI   Patient is a 67 year old female with a history of hypertension, hyperlipidemia, atrial fibrillation on Eliquis which she did take a dose of this morning, TIA, type 2 diabetes presenting to the emergency department for evaluation of chest pain for the last 2 days that gets worse with exertion, improves with rest.  Nothing particular seems to make the symptoms better or worse otherwise.  She denies any cough or congestion shortness of breath fever chills or sweats abdominal pain nausea vomiting or constipation.        REVIEW OF SYSTEMS     A 14 point review of systems was performed and negative except as documented above        PHYSICAL EXAM  Physical Exam  CONSTITUTIONAL: No acute distress. Speaks in full sentences.  SKIN:No rashes or lesions noted.  HEENT: Scalp is normocephalic. Moist mucous membranes.  EYES: No injection.  NECK: Neck is supple and soft.  RESPIRATORY: No rales, rhonchi or wheezing.  CARDIAC: No murmurs rubs or gallops.  GASTROINTESTINAL:Nondistended, with normal bowel sounds. No organomegaly, mass. No tenderness.  GU/BACK: No hernias.  EXTREMITIES: No swelling or deformity. Pulses are normal.  NEUROLOGIC: No focalities. Mental status: awake and oriented.          Past Medical History:   Diagnosis Date   . Acute, but ill-defined, cerebrovascular disease     TIA   . Arthropathy     knees   . Asthma    . Bipolar disorder (HCC)    . Cardiac arrhythmia     h/o Afib on Eliquis   . Cerebral artery occlusion with cerebral infarction [facility])     CVA post endodartorectomy   . Chest pain     has history multiple admissions for this, stress test and cardiac cath negative   . Chronic anemia     Hgb ~10   . COPD (chronic obstructive pulmonary disease) (HCC)     . Cough    . COVID    . COVID-19 vaccine series completed    . CVA (cerebral vascular accident) (HCC) 09/01/2021   . Deficiency anemia 11/2020    rx with Iron Infusions   . Epilepsy (HCC)     per pt last Sz was approx 9-10 months ago   . Esophageal reflux    . Headache    . 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 Hemorrhoids    . History of blood transfusion     following one of her pregnancy   . History of cardiac catheterization no obstructive cad in 2017    . Hyperlipidemia    . Hyperlipidemia    . Hypertension    . Lower leg edema    . Migraines    . Paroxysmal atrial fibrillation (HCC)     on anticoagulation   . Seizure-like activity (HCC)     EEG negative   . Stiff person syndrome    . Syncope     recurrent, has loop recorder   . Thoracic aortic aneurysm (HCC)    . TIA (transient ischemic attack)     multiple since age 47's per patient. Multiple MRI brain in the past negative for evidence of stroke. Suspected complicated migraine.   Marland Kitchen  Type 2 diabetes mellitus (HCC)     not on medication   . Unable to coordinate sucking, swallowing, and breathing 09/28/2019     Past Surgical History:   Procedure Laterality Date   . BREAST BIOPSY Right 06/18/2021    right ultrasound core biopsy with clip placed   . CARDIAC CATH  2016, 02/2016   . CAROTID ENDARTERECTOMY     . COLONOSCOPY N/A 07/01/2013    Procedure: COLONOSCOPY;  Surgeon: Earlean Shawl, MD;  Location: [facility] ENDO OR LOC;  Service: Endoscopy GI;  Laterality: N/A;   . COLONOSCOPY N/A 04/13/2015    Procedure: COLONOSCOPY FLEXIBLE;  Surgeon: Posey Rea, MD;  Location: [facility] ENDO OR LOC;  Service: Endoscopy GI;  Laterality: N/A;   . COLONOSCOPY WITH BIOPSY N/A 04/13/2015    Procedure: COLONOSCOPY FLEXIBLE WITH BIOPSY;  Surgeon: Posey Rea, MD;  Location: [facility] ENDO OR LOC;  Service: Endoscopy GI;  Laterality: N/A;   . COLONOSCOPY WITH BIOPSY N/A 01/31/2017    Procedure:  COLONOSCOPY FLEXIBLE WITH BIOPSY;  Surgeon: Brett Canales, MD   . COLONOSCOPY WITH BIOPSY N/A 05/29/2017    Procedure: COLONOSCOPY, WITH BIOPSY;  Surgeon: Gretta Cool, MD   . COLONOSCOPY WITH BIOPSY N/A 05/29/2017    Procedure: COLONOSCOPY, WITH BIOPSY;  Surgeon: Gretta Cool, MD   . EGD N/A 07/01/2013    Procedure: ESOPHAGOGASTRODUODENOSCOPY;  Surgeon: Earlean Shawl, MD;  Location: [facility] ENDO OR LOC;  Service: Endoscopy GI;  Laterality: N/A;   . EGD N/A 08/08/2014    Procedure: ESOPHAGOGASTRODUODENOSCOPY FLEXIBLE TRANSORAL DIAGNOSTIC;  Surgeon: Phylis Bougie, MD;  Location: [facility] ENDO OR LOC;  Service: Endoscopy GI;  Laterality: N/A;   . EGD WITH BIOPSY N/A 06/07/2016    Procedure: ESOPHAGOGASTRODUODENOSCOPY FLEXIBLE TRANSORAL WITH BIOPSY;  Surgeon: Brett Canales, MD   . EGD WITH BIOPSY N/A 03/20/2020    Procedure: EGD, WITH BIOPSY;  Surgeon: Gretta Cool, MD   . EGD WITH BLEEDING CONTROL N/A 05/29/2017    Procedure: EGD, WITH HEMORRHAGE CONTROL;  Surgeon: Gretta Cool, MD   . EXCISION OF BENIGN LESION Right 10/19/2021    Procedure: EXCISION, LESION, BENIGN, BREAST;  Surgeon: Eulis Manly, MD   . HEART CATHETERIZATION     . HYSTERECTOMY     . LOOP ELECTROSURGICAL EXCISON PROCEDURE     . LOOP RECORDER  2018   . OTHER Right 06/18/2021    axillary lymph node biopsy with clip placed   . OTHER  05/2021    Left leg ablation procedure   . VENOUS ACCESS CATHETER INSERTION Right 03/29/2019     Family History   Problem Relation Age of Onset   . Hypertension Mother    . Diabetes Mother    . Hypertension Sister    . Diabetes Brother    . Hypertension Brother    . Other Family History Father         Unknown to patient   . Anesthesia Reaction Neg Hx      Social History     Occupational History   . Not on file   Tobacco Use   . Smoking status: Never     Passive exposure: Never   . Smokeless tobacco: Never   Vaping Use   . Vaping status: Never Used   Substance and Sexual Activity   . Alcohol use: No      Alcohol/week: 0.0 standard drinks of alcohol   . Drug use: No   . Sexual activity: Not Currently  No outpatient medications have been marked as taking for the 06/05/23 encounter [facility] Encounter).     Allergies   Allergen Reactions   . Montelukast rash/itching and swelling   . Nitroglycerin hives and rash/itching   . Immune Globulin (Human) (Igg) swelling and rash/itching     Pt complain of itchiness and swelling of lips and tongue. Saturation fine, bp elevated.   . Magnesium Sulfate rash/itching   . Amoxicillin hives     Has tolerated ceftriaxone, cephalexin   . Atorvastatin unknown and other/intolerance     Rhabdomyolysis   Pt states she thinks it caused her to have rhabdo   . Bumex [Bumetanide] muscle/joint pain   . Ceftriaxone rash/itching   . Magnesium Chloride rash/itching   . Iodinated Contrast Media swelling     Throat swelling and lip itching   . Penicillins hives     Has tolerated ceftriaxone and cephalexin   . Tetracyclines hives   . Latex, Natural Rubber rash/itching and hives   . Kit Prep Of Tc-47m-Tetrofosmin unknown           MDM/EDCOURSE    CBC to screen infectious response, BMP to screen electrolyte abnormalities, troponin and EKG to screen MI and dysrhythmia, CXR to screen pneumonia.     Patient states that she is allergic to aspirin as it has given her GI bleeds in the past and is allergic to nitroglycerin.  Will avoid these.  She did take her Eliquis this morning and is currently anticoagulated.    The patient's HEART score was 5 placing them in the high risk group for cardiac events.           MDM Data  External documents reviewed: Previous admissions, ER visits, outpatient notes, nursing reports, imaging and labs if applicable       ED Course as of 06/05/23 1514   Mon Jun 05, 2023   1331 INTERPRETATION OF EKG BY EMERGENCY PHYSICIAN: Myself, Nicolette Bang, MD  Rhythm: Normal sinus, rate: 79 bpm.  Nonspecific ST segment changes.   [JK]   1434 CBC shows no leukocytosis, does show mild  anemia.  BMP shows no significant electrolyte abnormalities.  LFT show no significant hepatic injury.  TSH within normal limits making hyper hypothyroidism unlikely.  Troponin and EKG show no evidence of STEMI or non-STEMI. [JK]   1435 Chest x-ray shows no evidence of acute cardiopulmonary disease per rad [JK]       PROCEDURES  Procedures            Vital Signs:  Patient Vitals for the past 72 hrs:   Temp Heart Rate Pulse Resp BP BP Mean SpO2 Weight   06/05/23 1447 -- 72 72 17 114/52 73 MM HG -- --   06/05/23 1313 99.1 F (37.3 C) 84 -- 18 116/62 80 MM HG 98 % 73.9 kg (163 lb)       Documentation Review:  old patient records, nursing documentation    Diagnostics:  Labs:    Results for orders placed or performed during the hospital encounter of 06/05/23   COMPREHENSIVE METABOLIC PANEL   Result Value Ref Range    Potassium 5.4 3.5 - 5.5 mmol/L    Sodium 143 133 - 145 mmol/L    Chloride 107 98 - 110 mmol/L    Glucose 102 (H) 70 - 99 mg/dL    Calcium 9.1 8.4 - 16.1 mg/dL    Albumin 4.1 3.5 - 5.0 g/dL    SGPT (ALT) 6 5 - 40  U/L    SGOT (AST) 21 10 - 37 U/L    Bilirubin Total 0.2 0.2 - 1.2 mg/dL    Alkaline Phosphatase 93 40 - 120 U/L    BUN 13 6 - 22 mg/dL    CO2 23 20 - 32 mmol/L    Creatinine 1.1 0.8 - 1.4 mg/dL    eGFR 71.0 (L) >62.6 mL/min/1.73 sq.m.    Globulin 2.9 2.0 - 4.0 g/dL    A/G Ratio 1.4 1.1 - 2.6 ratio    Total Protein 7.0 6.2 - 8.1 g/dL    Anion Gap 94.8 3.0 - 15.0 mmol/L   TROPONIN   Result Value Ref Range    Troponin (T) Quant High Sensitivity (5th Gen) 9 0 - 19 ng/L   CBC WITH DIFFERENTIAL AUTO   Result Value Ref Range    WBC 4.8 4.0 - 11.0 K/uL    RBC 4.25 3.80 - 5.20 M/uL    HGB 10.8 (L) 11.7 - 16.1 g/dL    HCT 54.6 (L) 27.0 - 48.3 %    MCV 82 80 - 99 fL    MCH 25 (L) 26 - 34 pg    MCHC 31 31 - 36 g/dL    RDW 35.0 09.3 - 81.8 %    Platelet 268 140 - 440 K/uL    MPV 10.0 9.0 - 13.0 fL    Segmented Neutrophils (Auto) 45 40 - 75 %    Lymphocytes (Auto) 41 20 - 45 %    Monocytes (Auto) 8 3 - 12 %     Eosinophils (Auto) 5 0 - 6 %    Basophils (Auto) 1 0 - 2 %    Absolute Neutrophils (Auto) 2.1 1.8 - 7.7 K/uL    Absolute Lymphocytes (Auto) 2.0 1.0 - 4.8 K/uL    Absolute Monocytes (Auto) 0.4 0.1 - 1.0 K/uL    Absolute Eosinophils (Auto) 0.3 0.0 - 0.5 K/uL    Absolute Basophils (Auto) 0.0 0.0 - 0.2 K/uL   TSH   Result Value Ref Range    TSH 1.23 0.27 - 4.20 mcU/mL         EKG 12-LEAD         CHEST PORTABLE - Chest Pain   Final Result   IMPRESSION:   No evidence of active cardiopulmonary process. Aortic   atherosclerosis.         Electronically Signed     By: Carey Bullocks M.D.     On: 06/05/2023 13:33                 Final diagnoses:   [R07.9] Chest pain, unspecified type (Primary)     New Prescriptions    No medications on file       This document was constructed using the dragon dictation system. There may be unintentional dictation errors, please contact me if you have any questions or concerns about the note.  .            Transfer Certificate of Medical Necessity    The case was discussed with Dr. Sung Amabile at [facility] who accepts patient for transfer.  Reason for Transfer:  admission  Benefits of Transfer:  Definitive Treatment and Care  Risks of Transfer:  MVC, Progression of disease process, Delay in Care.  It is in my professional opinion as the ED Attending Physician for this patient that the benefits of transfer outweigh the risks and that this transfer must occur by ambulance for the reasons outlined above.  I have discussed the benefits/risk with the patient and/or family, any questions were answered; They are amenable with this transfer.    Creig Hines, MD;No* on 06/05/2023, 3:14 PM

## 2023-06-06 NOTE — Discharge Summary (Signed)
[facility]  [facility]       Discharge Summary   06/06/2023 1:15 PM      Were Interpreter Services Utilized?  No          Admission Diagnosis:  No diagnosis found.      Hospital course:  No notes on file     Final Discharge Diagnosis:  [name] is an 67 y.o. female who came from [facility] with history of hypertension, hyperlipidemia, atrial fibrillation on eliquis, TIA, type 2 DM, presented to the emergency department with complaint of chest pain that started 2 days ago.Patient stated chest pain gets worse with exertion, does improve with rest. Patient denies injury, trauma, fall, lifting or straining. Patient believes chest pain may be due to stiff person syndrome. In the emergency room twelve lead EKG shows normal sinus rhythm, rate 79 bpm, nonspecific ST segment changes. CBC shows no leukocytosis, does show mild anemia. BMP shows no significant electrolyte abnormalities. LFT show no significant hepatic injury. TSH within normal limits. Troponin and EKG show no evidence of STEMI or NSTEMI. Chest x-ray shows no evidence of acute cardiopulmonary disease.    Cardiology team evaluated the patient, troponins remain unremarkable, echocardiogram from 3/24 reviewed that shows ejection fraction of 55% with grade 1 diastolic dysfunction and moderate MR, he was continued on beta-blockers and started on Farxiga.  Atrial fibrillation was continued on Eliquis.  Patient is also instructed to discontinue taking torsemide no evidence of fluid overload with history of heart failure.   He is discharged in stable condition and will follow-up with PCP outpatient      Final discharge diagnosis and plan  # Chest pain on exertion-POA  EKG shows normal sinus rhythm  Troponin remain unremarkable  Cardiology team recommends to continue beta-blockers.      Heart failure with preserved ejection fraction  Last echocardiogram on 01/13/2023 ejection fraction of 55% with grade 1 diastolic  dysfunction, moderate AR mild MR  Torsemide and potassium are discontinued, started on Farxiga, continued on beta-blockers     # Anemia: chronic  -Stable     # Paroxysmal atrial fibrillation  - continue Eliquis  - continue home metoprolol for rate control     # Diabetes Mellitus, Type 2  Continue Home medications     # GERD without esophagitis  Continue home medications     # Hypertension  - continue home metoprolol       # Stiff person syndrome  - continue home requip , gabapentin     # History of seizure like activity  , Home medications        Dear [name] Media, MD   [name] is advised to follow up with you within 1-2 weeks.     Follow-up with: Medicine     Scheduled appointments:  Follow Up Info (next 45 days)       Follow up with [name] Media, MD in 4 week(s)    Specialty: Family Practice    Phone: [phone]    Where: [facility]                 Discharge Medication List        START taking these medications      dapagliflozin propanediol 10 mg Tabs  Start date: June 07, 2023  Take 1 Tab by Mouth Once a Day.  Dispense: 30 Tab  Refills: 1  Commonly known as: [place]  CONTINUE taking these medications      Ammonium Lactate 12 % Crea  PLEASE SEE ATTACHED FOR DETAILED DIRECTIONS  Refills: 0      apixaban 5 mg Tabs  Take 1 Tab by Mouth Twice Daily.  Refills: 0  Commonly known as: Eliquis      diazePAM 5 mg Tabs  Take 1 Tab by Mouth 2 (two) times a day.  Refills: 0  Commonly known as: Valium      diclofenac sodium 1 % Gel  USE 4 GRAM TO AFFECTED AREA 4 TIMES DAILY.  Dispense: 200 g  Refills: 2      EPINEPHrine 0.3 mg/0.3 mL Atin  Inject 0.3 mL into the muscle Take As Needed for Other (ANAPHYLAXIS). Q 10-30 minutes prn for ANAPHYLAXIS ONLY  Dispense: 2 Each  Refills: 1  Commonly known as: EpiPen      gabapentin 100 mg Caps  Take 1 Cap by Mouth 3 Times Daily.  Refills: 0  Commonly known as: Neurontin      hydrOXYzine pamoate 50 mg Caps  Take 1 Cap by Mouth 2 Times Daily As  Needed.  Refills: 0  Commonly known as: VistariL      lamoTRIgine 100 mg Tabs  Take 1 Tab by Mouth Once a Day. Indications: epilepsy  Refills: 0  Commonly known as: LaMICtal      * lurasidone 120 mg Tabs  Take 1 Tab by Mouth Once a Day.  Refills: 0      * lurasidone 40 mg Tabs  TAKE 1 TABLET DAILY WITH FOOD (AT LEAST 350 CALORIES)  Refills: 0  Commonly known as: Latuda      metoprolol XL 25 mg Tb24  Take 1 Tab by Mouth Once a Day.  Refills: 0  Commonly known as: Toprol XL      pantoprazole 40 mg Tbec  take 1 tablet by mouth twice a day  Dispense: 180 Tab  Refills: 0  Commonly known as: Protonix      rOPINIRole 0.5 mg Tabs  TAKE 2 TABS BY MOUTH EVERY NIGHT AT BEDTIME.  Dispense: 180 Tab  Refills: 1  Commonly known as: Requip      traMADoL 50 mg Tabs  Take 1 Tab by Mouth 2 Times Daily As Needed. for pain  Dispense: 60 Tab  Refills: 0  Commonly known as: Ultram           * * This list has 2 medication(s) that are the same as other medications prescribed for you. Read the directions carefully, and ask your doctor or other care provider to review them with you.                STOP taking these medications        Reason for Stopping   potassium chloride 10 mEq Tbsr  Commonly known as: Klor-Con      torsemide 20 mg Tabs  Commonly known as: Demadex                 Allergies   Allergen Reactions   . Montelukast rash/itching and swelling   . Nitroglycerin hives and rash/itching   . Immune Globulin (Human) (Igg) swelling and rash/itching     Pt complain of itchiness and swelling of lips and tongue. Saturation fine, bp elevated.   . Magnesium Sulfate rash/itching   . Amoxicillin hives     Has tolerated ceftriaxone, cephalexin   . Atorvastatin unknown and other/intolerance     Rhabdomyolysis   Pt  states she thinks it caused her to have rhabdo   . Bumex [Bumetanide] muscle/joint pain   . Ceftriaxone rash/itching   . Magnesium Chloride rash/itching   . Iodinated Contrast [name] swelling     Throat swelling and lip itching   .  Penicillins hives     Has tolerated cephalexin   . Tetracyclines hives   . Latex, Natural Rubber rash/itching and hives   . Kit Prep Of Tc-56m-Tetrofosmin unknown         Disposition: Home     Discharge Condition: Stable    Diet at the time of discharge: Regular Diet     Discharge Exam:   General alert and oriented x3 , Cardiology normal rate regular rhythm  , Lungs: clear to ausulatation, no wheeze, rales or rhonchi, systemic air entry , Abdomen: soft non tender non distended , Extremetites: no pitting edema , and Neurology: no focal neuro deficit noted      Total time for discharge - review of data, exam, discussion with providers and care-team, med-rec and orders, arranging follow up, counseling of patient and/or family and documentation was 47              Code Status Information       Code Status    FULL                Recent Results (from the past 72 hour(s))   COMPREHENSIVE METABOLIC PANEL    Collection Time: 06/05/23  1:40 PM   Result Value Ref Range    Potassium 5.4 3.5 - 5.5 mmol/L    Sodium 143 133 - 145 mmol/L    Chloride 107 98 - 110 mmol/L    Glucose 102 (H) 70 - 99 mg/dL    Calcium 9.1 8.4 - 84.1 mg/dL    Albumin 4.1 3.5 - 5.0 g/dL    SGPT (ALT) 6 5 - 40 U/L    SGOT (AST) 21 10 - 37 U/L    Bilirubin Total 0.2 0.2 - 1.2 mg/dL    Alkaline Phosphatase 93 40 - 120 U/L    BUN 13 6 - 22 mg/dL    CO2 23 20 - 32 mmol/L    Creatinine 1.1 0.8 - 1.4 mg/dL    eGFR 32.4 (L) >40.1 mL/min/1.73 sq.m.    Globulin 2.9 2.0 - 4.0 g/dL    A/G Ratio 1.4 1.1 - 2.6 ratio    Total Protein 7.0 6.2 - 8.1 g/dL    Anion Gap 02.7 3.0 - 15.0 mmol/L   TROPONIN    Collection Time: 06/05/23  1:40 PM   Result Value Ref Range    Troponin (T) Quant High Sensitivity (5th Gen) 9 0 - 19 ng/L   CBC WITH DIFFERENTIAL AUTO    Collection Time: 06/05/23  1:40 PM   Result Value Ref Range    WBC 4.8 4.0 - 11.0 K/uL    RBC 4.25 3.80 - 5.20 M/uL    HGB 10.8 (L) 11.7 - 16.1 g/dL    HCT 25.3 (L) 66.4 - 48.3 %    MCV 82 80 - 99 fL    MCH 25 (L) 26 - 34  pg    MCHC 31 31 - 36 g/dL    RDW 40.3 47.4 - 25.9 %    Platelet 268 140 - 440 K/uL    MPV 10.0 9.0 - 13.0 fL    Segmented Neutrophils (Auto) 45 40 - 75 %    Lymphocytes (Auto) 41 20 -  45 %    Monocytes (Auto) 8 3 - 12 %    Eosinophils (Auto) 5 0 - 6 %    Basophils (Auto) 1 0 - 2 %    Absolute Neutrophils (Auto) 2.1 1.8 - 7.7 K/uL    Absolute Lymphocytes (Auto) 2.0 1.0 - 4.8 K/uL    Absolute Monocytes (Auto) 0.4 0.1 - 1.0 K/uL    Absolute Eosinophils (Auto) 0.3 0.0 - 0.5 K/uL    Absolute Basophils (Auto) 0.0 0.0 - 0.2 K/uL   TSH    Collection Time: 06/05/23  1:40 PM   Result Value Ref Range    TSH 1.23 0.27 - 4.20 mcU/mL   GLUCOSE SCREEN    Collection Time: 06/05/23  5:02 PM   Result Value Ref Range    Glucose POC 89 70 - 99 mg/dL   GLUCOSE SCREEN    Collection Time: 06/05/23  7:49 PM   Result Value Ref Range    Glucose POC 122 (H) 70 - 99 mg/dL   GLUCOSE SCREEN    Collection Time: 06/06/23  8:16 AM   Result Value Ref Range    Glucose POC 58 (L) 70 - 99 mg/dL   Troponin U1L x2    Collection Time: 06/06/23  8:38 AM   Result Value Ref Range    Troponin (T) Quant High Sensitivity (5th Gen) 7 0 - 19 ng/L   Sedimentation Rate    Collection Time: 06/06/23  8:38 AM   Result Value Ref Range    Sedimentation Rate 46 (H) <=30 mm/hr   CBC    Collection Time: 06/06/23  8:38 AM   Result Value Ref Range    WBC 6.1 4.0 - 11.0 K/uL    RBC 4.03 3.80 - 5.20 M/uL    HGB 10.5 (L) 11.7 - 16.1 g/dL    HCT 24.4 (L) 01.0 - 48.3 %    MCV 81 80 - 99 fL    MCH 26 26 - 34 pg    MCHC 32 31 - 36 g/dL    RDW 27.2 53.6 - 64.4 %    Platelet 277 140 - 440 K/uL    MPV 9.9 9.0 - 13.0 fL   COMPREHENSIVE METABOLIC PANEL    Collection Time: 06/06/23  8:38 AM   Result Value Ref Range    Potassium 3.8 3.5 - 5.5 mmol/L    Sodium 139 133 - 145 mmol/L    Chloride 100 98 - 110 mmol/L    Glucose 94 70 - 99 mg/dL    Calcium 9.4 8.4 - 03.4 mg/dL    Albumin 3.8 3.5 - 5.0 g/dL    SGPT (ALT) 6 5 - 40 U/L    SGOT (AST) 12 10 - 37 U/L    Bilirubin Total 0.2 0.2 - 1.2  mg/dL    Alkaline Phosphatase 94 40 - 120 U/L    BUN 13 6 - 22 mg/dL    CO2 31 20 - 32 mmol/L    Creatinine 1.2 0.8 - 1.4 mg/dL    eGFR 74.2 (L) >59.5 mL/min/1.73 sq.m.    Globulin 3.3 2.0 - 4.0 g/dL    A/G Ratio 1.2 1.1 - 2.6 ratio    Total Protein 7.1 6.2 - 8.1 g/dL    Anion Gap 8.0 3.0 - 15.0 mmol/L   MAGNESIUM SERUM    Collection Time: 06/06/23  8:38 AM   Result Value Ref Range    Magnesium 1.7 1.6 - 2.5 mg/dL   PHOSPHORUS SERUM  Collection Time: 06/06/23  8:38 AM   Result Value Ref Range    Phosphorus 4.3 2.5 - 4.5 mg/dL   GLUCOSE SCREEN    Collection Time: 06/06/23  9:01 AM   Result Value Ref Range    Glucose POC 55 (L) 70 - 99 mg/dL   GLUCOSE SCREEN    Collection Time: 06/06/23  9:22 AM   Result Value Ref Range    Glucose POC 65 (L) 70 - 99 mg/dL   GLUCOSE SCREEN    Collection Time: 06/06/23 11:19 AM   Result Value Ref Range    Glucose POC 125 (H) 70 - 99 mg/dL        Activity:      Objective   BP: 121/58  Heart Rate: 58  Pulse: 79  Temp: 98.2 F (36.8 C)  Resp: 17  Height: 5\' 5"  (165.1 cm)  Weight: 96.4 kg (212 lb 9.6 oz)  SpO2: 96 %    Temp (24hrs), Avg:98.1 F (36.7 C), Min:97.4 F (36.3 C), Max:98.6 F (37 C)      Signed   Darrol Jump, MD     Hospital Medicine  06/06/2023  1:15 PM    Voice recognition dictation software has been used to generate this document.  In doing so, there is potential for error.  Examples of errors include spelling, punctuation, formatting, omission of words, insertion of words, incorrect words.  I have reviewed the document for accuracy.  Should there be concern for potential error that may impact treatment or billing, please reach out to me directly for clarification.   *Some images could not be shown.

## 2023-06-06 NOTE — Consults (Signed)
-------------------------------------------------------------------------------  Attestation signed by Hale Bogus, MD at 06/07/23 [phone]  I personally obtained history, examined the patient and directed the development of the management plan.  I agree with the findings as documented except as I have noted.    I have independently performed the substantive portion of the visit including the medical decision making and formulation of the assessment and plan.        Hale Bogus, MD  6:25 AM, 06/07/2023    -------------------------------------------------------------------------------    Cardiology Consult        DATE OF  ADMISSION:06/05/2023  4:59 PM  DATE OF CONSULT: 06/06/2023 10:31 AM  [id number] MRN  [name]    Reason for Consultation:CP   Patient discussed with Dr. Myriam Forehand Problems    Diagnosis    . Neuropathy [G62.9]    . Neurogenic bladder [N31.9]    . Chronic diastolic (congestive) heart failure (HCC) [I50.32]    . Seizure (HCC) [R56.9]    . Type 2 diabetes mellitus with diabetic neuropathy, without long-term current use of insulin (HCC) [E11.40]    . Stable angina pectoris (HCC) [I20.89]    . Gastroesophageal reflux disease without esophagitis [K21.9]    . Chronic obstructive pulmonary disease (HCC) [J44.9]    . Bipolar 1 disorder (HCC) [F31.9]    . Paroxysmal atrial fibrillation (HCC) [I48.0]    . Essential hypertension [I10]          Assessment and Plan:     Chest pain   Trop 9. EKG no acute ischemic changes   Cardiac PET Stress test on 08/27/2019 normal   LHC 2022 no CAD   Echo on 3/24 EF 55%, Grade I DD, moderate aortic valve regurgitation   Continue with BB   Started on Farxiga   Can be discharged from cardiac perspective     pAfib - on Eliquis   EKG no admission SR   Continue with Eliquis     Chronic diastolic HF   Echo on 3/24 EF 28%, Grade I DD, moderate aortic valve regurgitation   NO SOB . On exam no fluid overload   Continue with BB. Started on Farxiga   Stop torsemide and  potassium     HTN   HLD  DMII   TIA   Stiff person syndrome       History of Present Illness:     LAURAETTA OREY is a 67 y.o. female who is being admitted for CP. Patient believes chest pain may be due to stiff person syndrome. No associated  symptoms, no radiation of the chest pain.        Family Hx:   Family History   Problem Relation Age of Onset   . Hypertension Mother    . Diabetes Mother    . Hypertension Sister    . Diabetes Brother    . Hypertension Brother    . Other Family History Father         Unknown to patient   . Anesthesia Reaction Neg Hx          Social Hx:    Social History     Socioeconomic History   . Marital status: Divorced     Spouse name: Not on file   . Number of children: Not on file   . Years of education: Not on file   . Highest education level: Not on file   Occupational History   .  Not on file   Tobacco Use   . Smoking status: Never     Passive exposure: Never   . Smokeless tobacco: Never   Vaping Use   . Vaping status: Never Used   Substance and Sexual Activity   . Alcohol use: No     Alcohol/week: 0.0 standard drinks of alcohol   . Drug use: No   . Sexual activity: Not Currently   Other Topics Concern   . Back Care Not Asked   . Bike Helmet Not Asked   . Blood Transfusions Not Asked   . Caffeine Concern Not Asked   . Counseling Not Asked   . Depression Concerns Not Asked   . Depression Screening Not Asked   . Exercise Not Asked   . Hobby Hazards Not Asked   . Military Service Not Asked   . Occupational Exposure Not Asked   . Seat Belt Not Asked   . Self-Exams Not Asked   . Sleep Concern Not Asked   . Smoke Detectors Not Asked   . Smoking Concerns Not Asked   . Smoking Cessation Not Asked   . Special Diet Not Asked   . Stress Concern Not Asked   . Weight Concern Not Asked   . Do you use a bike helmet? Not Asked   . Do you use caffeine daily? Not Asked   . Do you exercise? Not Asked   . Are there hazards related to your hobbies? Not Asked   . Military Service Not Asked   . Do you have  any work place hazards? Not Asked   . Do you wear a seat belt while in a moving vehicle? Not Asked   . Do you have sleep concerns? Not Asked   . Do you follow a special diet? Not Asked   . Are you concerned with your weight? Not Asked   Social History Narrative    DNR/DNI    MPOA - her daughter Maureen Chatters     Social Determinants of Health     Financial Resource Strain: Low Risk  (05/31/2023)    Overall Financial Resource Strain (CARDIA)    . Difficulty of Paying Living Expenses: Not hard at all   Food Insecurity: No Food Insecurity (05/31/2023)    Hunger Vital Sign    . Worried About Programme researcher, broadcasting/film/video in the Last Year: Never true    . Ran Out of Food in the Last Year: Never true   Transportation Needs: No Transportation Needs (05/31/2023)    PRAPARE - Transportation    . Lack of Transportation (Medical): No    . Lack of Transportation (Non-Medical): No   Physical Activity: Insufficiently Active (05/31/2023)    Exercise Vital Sign    . Days of Exercise per Week: 1 day    . Minutes of Exercise per Session: 10 min   Stress: Stress Concern Present (05/31/2023)    [employer] of Occupational Health - Occupational Stress Questionnaire    . Feeling of Stress : Rather much   Social Connections: Moderately Integrated (05/31/2023)    Social Connection and Isolation Panel [NHANES]    . Frequency of Communication with Friends and Family: More than three times a week    . Frequency of Social Gatherings with Friends and Family: Never    . Attends Religious Services: More than 4 times per year    . Active Member of Clubs or Organizations: Yes    . Attends Banker Meetings:  More than 4 times per year    . Marital Status: Divorced   Catering manager Violence: Not At Risk (05/31/2023)    Humiliation, Afraid, Rape, and Kick questionnaire    . Fear of Current or Ex-Partner: No    . Emotionally Abused: No    . Physically Abused: No    . Sexually Abused: No   Housing Stability: Low Risk  (05/31/2023)    Housing Stability Vital Sign     . Unable to Pay for Housing in the Last Year: No    . Number of Times Moved in the Last Year: 0    . Homeless in the Last Year: No       Cardiac Data Review    Results for orders placed or performed during the hospital encounter of 01/12/23   Echo Cardiogram Complete   Result Value Ref Range    EF Echo 55     Narrative    CONCLUSIONS    * On 09/01/2016, Bubble study was performed and was negative for shunt in TEE exam.    * No evidence of shunt at atrial level by two-dimensional and color flow.    * Left ventricular systolic function is normal with an ejection fraction of 55 % by Simpson's biplane.    * Left ventricular diastolic function: Grade I diastolic dysfunction.    * Right ventricular systolic function is normal.    * There is mild diffuse thickening (sclerosis) of the aortic valve cusps.    * There is moderate aortic valve regurgitation.    * Mitral valve has mild leaflet calcification.    * There is mild mitral valve regurgitation.    Comparison    * Compared to prior study from 12/07/2021.      Patient Info  Name:     ALLIA HIEGEL  Age:     66 years  DOB:     May 24, 1956  Gender:     Female  MRN:     [id number]  Ht:     65 in  Wt:     160 lb  BSA:     1.84 m2  BP:     94  /     46 mmHg  Exam Date:     01/13/2023 8:17 AM  Patient Status:     IP    Exam Type:     ECHO CARDIOGRAM COMPLETE    Indications      Syncope/Presyncope/Lightheadedness -      Left Ventricle    Left ventricular systolic function is normal with an ejection fraction of 55 % by Simpson's biplane. Left ventricular chamber size is normal. Left ventricular diastolic function: Grade I diastolic dysfunction.    Right Ventricle    Tricuspid annular plane systolic excursion (TAPSE) is normal, 3.1 cm. Right ventricular systolic function is normal. Right ventricular chamber dimension is normal.      Left Atrium    Left atrial chamber is normal with a left atrial volume index biplane method of disk (BP MOD) of 36 ml/m^2.    Right Atrium    Right atrial  chamber size is normal.    Atrial Septum    No evidence of shunt at atrial level by two-dimensional and color flow. On 09/01/2016, Bubble study was performed and was negative for shunt in TEE exam.      Aortic Valve    The aortic valve is tricuspid. There is no aortic valve stenosis. There is mild diffuse thickening (  sclerosis) of the aortic valve cusps. There is moderate aortic valve regurgitation.    Pulmonic Valve    The pulmonic valve is not well visualized. There is no pulmonic valve stenosis. There is no pulmonic regurgitation.    Mitral Valve    The mitral valve has normal leaflets. There is no mitral valve stenosis. There is mild mitral valve regurgitation. Mitral valve has mild leaflet calcification.    Tricuspid Valve    The tricuspid valve leaflets are normal. There is no tricuspid valve stenosis. There is trace tricuspid valve regurgitation. Unable to estimate pulmonary arterial pressure due to inadequate tricuspid regurgitant Doppler waveforms.      Pericardium/Pleural    There is trivial pericardial effusion.    Inferior Vena Cava    Normal inferior vena cava diameter with >50% collapse upon inspiration consistent with normal right atrial pressure, 3 mmHg.    Aorta    The proximal ascending aorta is normal measuring 2.90 cm with an index of 1.6 cm/m2. The aortic root at the sinus of Valsalva is normal measuring 2.90 cm with an index of 1.6. The aortic measurements are indexed to body surface area.      Left Ventricular Outflow Tract  ----------------------------------------------------------------------  Name                                 Value        Normal  ----------------------------------------------------------------------    LVOT Doppler  ----------------------------------------------------------------------  LVOT Peak Velocity              101.0 cm/s                 LVOT Peak Gradient                4.0 mmHg    Pulmonic  Valve  ----------------------------------------------------------------------  Name                                 Value        Normal  ----------------------------------------------------------------------    PV Doppler  ----------------------------------------------------------------------  PV Peak Velocity                 83.4 cm/s                 PV Peak Gradient                  3.0 mmHg    Mitral Valve  ----------------------------------------------------------------------  Name                                 Value        Normal  ----------------------------------------------------------------------    MV Doppler  ----------------------------------------------------------------------  MV Decel Slope                   416 cm/s2                 MV PHT                               58 ms                 MV Area (PHT)  3.8 cm2       4.0-5.0     MV Diastolic Function  ----------------------------------------------------------------------  MV E Peak Velocity               82.0 cm/s                 MV A Peak Velocity               95.1 cm/s                 MV E/A                                 0.9       0.8-2.0   MV Decel Time PW                    197 ms                   MV Annular TDI  ----------------------------------------------------------------------  MV Septal e' Velocity             7.8 cm/s         >=7.0   MV E/e' (Septal)                      10.5                 MV Lateral e' Velocity            5.4 cm/s        >=10.0   MV E/e' (Lateral)                     15.1                 MV e' Average                     6.6 cm/s                 MV E/e' (Average)                     12.8        <=14.0    Tricuspid Valve  ----------------------------------------------------------------------  Name                                 Value        Normal  ----------------------------------------------------------------------    Estimated  PAP/RSVP  ----------------------------------------------------------------------  RA Pressure                         3 mmHg            <8    Aorta  ----------------------------------------------------------------------  Name                                 Value        Normal  ----------------------------------------------------------------------    Ascending Aorta  ----------------------------------------------------------------------  Sinus of Valsalva Diameter          2.9 cm                 Prox Asc Ao Diameter  2.9 cm         <=3.1   Prox Asc Ao Diameter Index       1.6 cm/m2         <=1.9    Venous  ----------------------------------------------------------------------  Name                                 Value        Normal  ----------------------------------------------------------------------    IVC/SVC  ----------------------------------------------------------------------  IVC Diameter (Exp 2D)               1.9 cm         <=2.1    Aortic Valve  ----------------------------------------------------------------------  Name                                 Value        Normal  ----------------------------------------------------------------------    AV Doppler  ----------------------------------------------------------------------  AV Peak Velocity                   1.5 m/s                 AV Peak Gradient                  9.0 mmHg                  AV Dimensionless Index  (Peak Velocity)                        0.7                   AV Regurgitation Doppler  ----------------------------------------------------------------------  AR Decel Slope                  298.0 cm/s2                 AR PHT                              318 ms    Ventricles  ----------------------------------------------------------------------  Name                                 Value        Normal  ----------------------------------------------------------------------    LV Dimensions  2D/MM  ----------------------------------------------------------------------   IVS Diastolic Thickness  (2D)                                0.8 cm         <=0.9   LVID Diastole (2D)                  5.6 cm       3.8-5.2    LVPW Diastolic Thickness  (2D)                                0.9 cm         <=0.9   LVID Systole (2D)                   4.0 cm  2.2-3.5   LVID Diastolic Index (2D)        3.0 cm/m2       2.3-3.1     LV Fractional Shortening/Ejection Fraction 2D/MM  ----------------------------------------------------------------------  LV EF (2D Teichholz)                  54 %                 LV EF (BP MOD)                        54 %         54-74    LV Fractional Shortening  (2D)                                28.6 %     27.0-45.0    LV Diastolic Volume Index  (BP MOD)                        61.4 ml/m2     29.0-61.0   LV SV (2C MOD)                     51.3 ml                 LV SV (BP MOD)                     60.8 ml                 LV SV (Cubed)                     111.6 ml                 LV SV (Teich)                      83.7 ml                 LV EDV (Cubed)                    175.6 ml                 LV ESV (Cubed)                     64.0 ml                 LV EDV (Teich)                    153.7 ml                 LV ESV (Teich)                     70.0 ml                   RV Dimensions 2D/MM  ----------------------------------------------------------------------   RV Basal Diastolic  Dimension                           2.9 cm       2.5-4.1   TAPSE  3.1 cm         >=1.7    Atria  ----------------------------------------------------------------------  Name                                 Value        Normal  ----------------------------------------------------------------------    LA Dimensions  ----------------------------------------------------------------------  LA Dimension (2D)                   3.8 cm       2.7-3.8   LA Dimen Index (2D)              2.1 cm/m2                  LA Volume (BP A-L)                 69.6 ml                 LA Volume Index (BP A-L)        37.8 ml/m2         <35.0   LA Volume Index (BP MOD)        35.9 ml/m2        <=34.0   LA/Ao (2D)                             1.3                   RA Dimensions  ----------------------------------------------------------------------   RA Diastolic Major Axis  Length (4C)                         4.8 cm                  RA Diastolic Major Axis  Length Index (4C)                      0.3                 RA Area (4C)                      15.3 cm2         <21.0   RA ESV Index (4C MOD)           22.6 ml/m2     15.0-27.0      Technical Quality:     Technically difficult study    Complete transthoracic echocardiogram performed with 2D imaging, color Doppler, and spectral Doppler.    Staff  Ordering Provider:     Lyman Bishop  Sonographer:     Valente David RDCS    [id number]      Report Signatures  Finalized by Willeen Niece  MD on 01/13/2023 02:00 PM     *Note: Due to a large number of results and/or encounters for the requested time period, some results have not been displayed. A complete set of results can be found in Results Review.       EKG Results: SR  Telemetry Results: SR  CXR: no acute process     Active Hospital Problems    Diagnosis    . Neuropathy [G62.9]    . Neurogenic bladder [N31.9]    . Chronic diastolic (congestive) heart failure (HCC) [I50.32]    .  Seizure (HCC) [R56.9]    . Type 2 diabetes mellitus with diabetic neuropathy, without long-term current use of insulin (HCC) [E11.40]    . Stable angina pectoris (HCC) [I20.89]    . Gastroesophageal reflux disease without esophagitis [K21.9]    . Chronic obstructive pulmonary disease (HCC) [J44.9]    . Bipolar 1 disorder (HCC) [F31.9]    . Paroxysmal atrial fibrillation (HCC) [I48.0]    . Essential hypertension [I10]      Past Medical History:   Diagnosis Date   . Acute, but ill-defined, cerebrovascular disease     TIA   . Arthropathy     knees   .  Asthma    . Bipolar disorder (HCC)    . Cardiac arrhythmia     h/o Afib on Eliquis   . 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 Chest pain     has history multiple admissions for this, stress test and cardiac cath negative   . Chronic anemia     Hgb ~10   . COPD (chronic obstructive pulmonary disease) (HCC)    . Cough    . COVID    . COVID-19 vaccine series completed    . CVA (cerebral vascular accident) (HCC) 09/01/2021   . Deficiency anemia 11/2020    rx with Iron Infusions   . Epilepsy (HCC)     per pt last Sz was approx 9-10 months ago   . Esophageal reflux    . Headache    . 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 Hemorrhoids    . History of blood transfusion     following one of her pregnancy   . History of cardiac catheterization no obstructive cad in 2017    . Hyperlipidemia    . Hyperlipidemia    . Hypertension    . Lower leg edema    . Migraines    . Paroxysmal atrial fibrillation (HCC)     on anticoagulation   . Seizure-like activity (HCC)     EEG negative   . Stiff person syndrome    . Syncope     recurrent, has loop recorder   . Thoracic aortic aneurysm (HCC)    . TIA (transient ischemic attack)     multiple since age 7's per patient. Multiple MRI brain in the past negative for evidence of stroke. Suspected complicated migraine.   . Type 2 diabetes mellitus (HCC)     not on medication   . Unable to coordinate sucking, swallowing, and breathing 09/28/2019        Allergies   Allergen Reactions   . Montelukast rash/itching and swelling   . Nitroglycerin hives and rash/itching   . Immune Globulin (Human) (Igg) swelling and rash/itching     Pt complain of itchiness and swelling of lips and tongue. Saturation fine, bp elevated.   . Magnesium Sulfate rash/itching   . Amoxicillin hives     Has tolerated ceftriaxone, cephalexin   . Atorvastatin unknown and other/intolerance     Rhabdomyolysis    Pt states she thinks it caused her to have rhabdo   . Bumex [Bumetanide] muscle/joint pain   . Ceftriaxone rash/itching   . Magnesium Chloride rash/itching   . Iodinated Contrast Media swelling     Throat swelling and lip itching   . Penicillins hives     Has tolerated cephalexin   . Tetracyclines hives   .  Latex, Natural Rubber rash/itching and hives   . Kit Prep Of Tc-25m-Tetrofosmin unknown        Medications Prior to Admission   Medication Sig Dispense Refill Last Dose   . Ammonium Lactate 12 % Top CREA PLEASE SEE ATTACHED FOR DETAILED DIRECTIONS   Unknown at Unknown   . apixaban (ELIQUIS) 5 mg PO TABS Take 1 Tab by Mouth Twice Daily.   06/05/2023 at 0900   . diazePAM (VALIUM) 5 mg PO TABS Take 1 Tab by Mouth 2 (two) times a day.   06/05/2023 at 0900   . diclofenac sodium 1 % Top GEL USE 4 GRAM TO AFFECTED AREA 4 TIMES DAILY. 200 g 2 06/05/2023 at 0900   . EPINEPHrine (EPIPEN) 0.3 mg/0.3 mL Inj AtIn Inject 0.3 mL into the muscle Take As Needed for Other (ANAPHYLAXIS). Q 10-30 minutes prn for ANAPHYLAXIS ONLY 2 Each 1 PRN at Unknown   . gabapentin (NEURONTIN) 100 mg PO CAPS Take 1 Cap by Mouth 3 Times Daily.   06/05/2023 at 0900   . hydrOXYzine pamoate (VISTARIL) 50 mg PO CAPS Take 1 Cap by Mouth 2 Times Daily As Needed.   PRN at Unknown   . lamoTRIgine (LAMICTAL) 100 mg PO TABS Take 1 Tab by Mouth Once a Day. Indications: epilepsy   06/05/2023 at 0900   . lurasidone (LATUDA) 40 mg PO TABS TAKE 1 TABLET DAILY WITH FOOD (AT LEAST 350 CALORIES)   06/05/2023 at 0900   . lurasidone 120 mg PO TABS Take 1 Tab by Mouth Once a Day.   06/05/2023 at 0900   . metoprolol XL (TOPROL XL) 25 mg PO TB24 Take 1 Tab by Mouth Once a Day.   06/05/2023 at 0900   . pantoprazole (PROTONIX) 40 mg PO TBEC take 1 tablet by mouth twice a day 180 Tab 0 06/05/2023 at 0900   . potassium chloride (KLOR-CON) 10 mEq PO TbSR Take 1 Tab by Mouth 3 Times Daily.   06/05/2023 at 0900   . rOPINIRole (REQUIP) 0.5 mg PO TABS TAKE 2 TABS BY MOUTH EVERY NIGHT AT  BEDTIME. 180 Tab 1 06/04/2023 at 2100   . torsemide (DEMADEX) 20 mg PO TABS Take 1 Tab by Mouth Twice Daily.   06/05/2023 at 0900   . traMADoL (ULTRAM) 50 mg PO TABS Take 1 Tab by Mouth 2 Times Daily As Needed. for pain 60 Tab 0 PRN at Unknown       Current Facility-Administered Medications   Medication Dose Route Frequency Provider Last Rate Last Admin   . acetaminophen (TylenoL) tablet 650 mg  650 mg Oral Q4H PRN Lyman Bishop, NP       . apixaban (Eliquis) tablet 5 mg  5 mg Oral BID Lestine Box, NP   5 mg at 06/06/23 0827   . D50W injection 25 mL  12.5 g IV Push PRN Lyman Bishop, NP       . dapagliflozin propanediol Marcelline Deist) tablet 10 mg  10 mg Oral Daily Abebe, Tinbit S, NP       . diazePAM (Valium) tablet 5 mg  5 mg Oral BID Lestine Box, NP   5 mg at 06/06/23 0827   . diphenhydrAMINE (BenadryL) injection 50 mg  50 mg IV Push Q4H PRN Lestine Box, NP   50 mg at 06/06/23 0559   . gabapentin (Neurontin) capsule 100 mg  100 mg Oral TID Lestine Box, NP   100 mg at 06/06/23 0827   . glucagon (Glucagen)  injection 1 mg  1 mg Intramuscular PRN Lyman Bishop, NP       . glucose (Dex4 Glucose) chew tab 4 Tab  4 Tab Oral PRN Lyman Bishop, NP       . HYDROmorphone (Dilaudid) injection 0.5 mg  0.5 mg IV Push Q4H PRN Lestine Box, NP   0.5 mg at 06/06/23 0600   . hydrOXYzine (Atarax) tablet 50 mg  50 mg Oral BID Lestine Box, NP   50 mg at 06/06/23 0828   . insulin lispro injection 1-6 Units  1-6 Units Subcutaneous QAC & QHS Lyman Bishop, NP       . lamoTRIgine (LaMICtal) tablet 100 mg  100 mg Oral Daily Lestine Box, NP   100 mg at 06/06/23 0830   . lurasidone (Latuda) tablet 160 mg  160 mg Oral Daily Lestine Box, NP   160 mg at 06/06/23 0829   . metoprolol XL (Toprol XL) tablet 25 mg  25 mg Oral Daily Lestine Box, NP   25 mg at 06/06/23 0828   . NS Flush injection 10 mL  10 mL Intravenous PRN Lyman Bishop, NP       . NS Flush injection 10 mL  10 mL Intravenous PRN Lestine Box,  NP       . omeprazole (PriLOSEC) capsule 40 mg  40 mg Oral QAM AC Rivas, Desiree, NP   40 mg at 06/06/23 0827   . rOPINIRole (Requip) tablet 1 mg  1 mg Oral QHS Lestine Box, NP   1 mg at 06/05/23 2111   . traMADoL (Ultram) tablet 50 mg  50 mg Oral Q4H PRN Madilyn Fireman, MD   50 mg at 06/06/23 0828       chest X-ray        Review of Systems    Constitutional:  No weight loss, fever, or night sweats  HEENT:  No vertigo, visual loss, or diplopia  Cardiovascular:   chest pain, shortness of breath, no PND and orthopnea  Respiratory:  No cough, sputum, asthma, or wheezing  Gastrointestinal:  No nausea, heartburn, abdominal pain, constipation or diarrhea  Genitourinary:  No dysuria, frequency, or urgency  Musculoskeletal:  No back pain, muscular weakness or joint swelling  Endocrine:   No cold intolerance, heat intolerance, or polyuria  Neurological:  No syncope, seizures, numbness or tingling of hands, numbness or tingling of feet, unsteady gait or speech difficulties  Skin:  edema, color change or chronic ulcers  Heme: No bruising, no petechiae, no bleeding gums  Psych: Normal affect, normal mood, no suicidal ideation    Objective:   BP 119/57   Pulse 61   Temp 97.4 F (36.3 C)   Resp 18   Ht 5\' 5"  (1.651 m)   Wt 96.4 kg (212 lb 9.6 oz)   LMP  (LMP Unknown)   SpO2 97%   BMI 35.38 kg/m     General Appearance:    Alert, cooperative, no distress, appears stated age   Head:    Normocephalic, atraumatic   Eyes:    PERRL, conjunctiva/corneas clear, sclera anicteric   Nose:   Nares normal, septum midline, mucosa normal, no drainage    or sinus tenderness   Throat:   Lips, mucosa, and tongue normal; Oropharynx clear, no exudate   Neck:   Supple, symmetrical, trachea midline, no adenopathy;        thyroid:  No enlargement/tenderness/nodules;  Carotids 2+ Bilaterally, no JVD   Lungs:  Clear to auscultation bilaterally, respirations unlabored   Chest wall:    No tenderness or deformity   Heart:    Regular rate and  rhythm, S1 and S2 normal, no murmur, no rub or gallop   Abdomen:     Soft, non-tender, bowel sounds active all four quadrants,     no masses, no organomegaly   Extremities:   Extremities warm, atraumatic, no clubbing, cyanosis or  edema   Pulses:   2+ and symmetric all extremities, including radial and DP   Skin:   Skin color, texture, turgor normal, no rashes or lesions   Neurologic:   Grossly normal. No focal deficits.       Labs:   Lab Results   Component Value Date    WBC 6.1 06/06/2023    HCT 32.8 (L) 06/06/2023    MCV 81 06/06/2023     Lab Results   Component Value Date    CKMB 1.8 11/22/2018     No components found for: "CKTOTAL"  Lab Results   Component Value Date    BUN 13 06/06/2023    NA 139 06/06/2023    CO2 31 06/06/2023         Tinbit S Abebe, NP   06/06/2023, 10:31 AM    Patient seen and examined by me with PA/NP. I have personally seen and examined this patient and reviewed clinical data and test results. I have participated in the care. I agree with the clinical information, including the physical exam, patient history, and planning as documented by the PA/NP. I addition, I have edited this note to reflect my findings and plan as well as to incorporate any new data.          Hale Bogus, MD, [facility]  [facility]

## 2023-06-08 LAB — INFLIXIMAB CONC. & ANTI-INFLIXIMAB ANTIBODY (SERIAL MONITOR)
Infliximab Antibody: <22 ng/mL
Infliximab: <0.4 ug/mL

## 2023-06-19 NOTE — Telephone Encounter (Signed)
Patient requesting Medication refill.      Medication Name Diclofenac 1% Gel    Last Ordered 01/09/2023     Medication Refill Policy Required Information      Last visit with provider was 05/01/2023      Next appt in this department: 11/02/2023

## 2023-06-21 ENCOUNTER — Ambulatory Visit: Payer: Medicare Other | Attending: Neurology | Admitting: Neurology

## 2023-06-21 ENCOUNTER — Encounter: Payer: Self-pay | Admitting: Neurology

## 2023-06-21 VITALS — BP 115/75 | HR 88 | Temp 98.3°F | Ht 65.0 in | Wt 208.4 lb

## 2023-06-21 DIAGNOSIS — R269 Unspecified abnormalities of gait and mobility: Secondary | ICD-10-CM | POA: Insufficient documentation

## 2023-06-21 DIAGNOSIS — E559 Vitamin D deficiency, unspecified: Secondary | ICD-10-CM | POA: Insufficient documentation

## 2023-06-21 DIAGNOSIS — E538 Deficiency of other specified B group vitamins: Secondary | ICD-10-CM | POA: Insufficient documentation

## 2023-06-21 DIAGNOSIS — G959 Disease of spinal cord, unspecified: Secondary | ICD-10-CM | POA: Insufficient documentation

## 2023-06-21 DIAGNOSIS — G320 Subacute combined degeneration of spinal cord in diseases classified elsewhere: Secondary | ICD-10-CM | POA: Insufficient documentation

## 2023-06-21 NOTE — Progress Notes (Unsigned)
NEUROLOGY  MULTIPLE SCLEROSIS & NEUROIMMUNOLOGY CENTER  Main Line / Scheduling / on-call: [phone]  Clinic Tel: [phone]  Fax: [phone]  Send [place] a mychart or inbasket message for the fastest response  ~~~~~~~~~~~~~~~~~~~~~~~~~~~~~~~~~~~~~~~~~~~~~~~~~~~~~~~~~~~~~~~~~~~~~~~~~~~~~~~~~~~~  Visit Date: 06/21/2023    Referring Neurologist:     CC: Dorsal column myelopathy    HPI:   History was obtained from records review, patient,     67 y.o. year-old female who feels like she is getting worse. Still falling.       EXAM  Visit Vitals  BP 115/75 (BP Site: Left arm, Patient Position: Sitting, Cuff Size: Large)   Pulse 88   Temp 98.3 F (36.8 C) (Oral)   Ht 1.651 m (5\' 5" )   Wt 94.5 kg (208 lb 6.4 oz)   LMP  (LMP Unknown)   SpO2 96%   BMI 34.68 kg/m     General:   Optic Nerves:   Psychiatric. tearful  Mental Status: The patient was awake, alert, appropriate  Cranial Nerves: CN II-XII   Motor:   Reflex:   Sensation:   Coordination:   Gait:       Imaging     Reports:  MRI Cervical Spine W WO Contrast    Result Date: 02/26/2023  Impression:  1. Within the confines of motion artifact, no definite focal intramedullary signal abnormality and enhancement within the cervical spinal cord. 2. Degenerative changes appear similar in extent to prior examination. Electronically signed by: Susy Frizzle M.D. [facility] DF: 02/26/23    MRI Thoracic Spine W WO Contrast    Result Date: 02/26/2023  Impression:  1. Motion degraded examination. There is suspicion of T2 hyperintense signal abnormality within primarily the mid-dorsal spinal cord at the thoracic levels as described above. No definite associated enhancement. Findings are nonspecific but could represent sequela of a chronic demyelinating/inflammatory process, or possibly a metabolic abnormality including B12 deficiency. 2. Stable mild degenerative changes. Electronically signed by: Susy Frizzle M.D. [facility] DF: 02/26/23    MRI BRAIN WO CONTRAST    Result Date: 01/13/2023  Impression:  IMPRESSION: 1. No acute abnormality. 2. Moderate chronic microvascular ischemic disease. Electronically Signed   By: Feliberto Harts M.D.   On: 01/13/2023 15:34    CT HEAD WO CONTRAST    Result Date: 01/12/2023  Impression:  IMPRESSION: CT head: 1.  No evidence of an acute intracranial abnormality. 2. Moderate chronic small vessel ischemic changes within the cerebral white matter. 3. Mild cerebellar atrophy. CT cervical spine: 1.  No evidence of an acute intracranial abnormality. 2. Nonspecific straightening of the expected cervical lordosis. 3. Slight grade 1 retrolisthesis at C5-C6. 4. Cervical spondylosis. Electronically Signed   By: Jackey Loge D.O.   On: 01/12/2023 12:25     CTA Chest 01/01/22- no hilar adenopathy    Testing       Labs  Lab Results   Component Value Date    VITB6 3.6 05/30/2023    HGBA1C 6.0 (H) 05/30/2023    EGFR 57 (L) 05/30/2023    CREAT 1.08 (H) 05/30/2023    WBC 4.9 05/30/2023    LYMPHOABS 2.3 05/30/2023    IGG 1,114 05/30/2023    IGA 477 (H) 05/30/2023    IGM 52 05/30/2023    B12 452 05/30/2023    VITD 13.6 (L) 05/30/2023     CSF glc 59, prot 42, r0, w1. ENC2 or GAD65 never sent. OCB never sent.  Serum GAD65 0.19 -> nonspecific. At value <2.0 nM unlikely to cause neurologic disease. It is probably a consequence of IVIG given ENC panel sent on 3/7th        Vit D low  Vit B12 borderline, but lowest vit B12 on file was 377 in 02/02/2016  ANA screen negative  MPO & PR3 negative  Autoimmune w/u negative  Cu wnl; zinc borderline low (Ok)    Missing  ANA /IFA negative    ASSESSMENT / PLAN  RTC: Return in about 2 months (around 08/21/2023) for [place].    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 Myelopathy    3. Vitamin B12 deficiency    4. Vitamin D deficiency    5. Neurologic gait dysfunction        67y/o F with dorsal column myelopathy. Autoimmune panel negative. SHe has hx low vit B12  and was rec'd to take vit B12 upon discharge. She is not taking vitamin B12.    DDX is vit B12 defcy, autoimmune and/or infectious. To evaluate latter 2 possibilities LP is necessary. Previous LP reviewed, was not ordered correctly    If LP is normal, that would suggest subacute combined degeneration secondary to vitamin B12 deficiency.     She was misdiagnosed with stiff person syndrome.     PLAN  EMG/NCS to evaluate subacute combined degeneration  Vit B12 daily  Vit D3 50k units week  LP  Refer to neuro PT for gait & balance    Orders today (details below)  Orders Placed This Encounter   Procedures    CSF JC Polyoma Virus DNA, Qualitative Real-Time PCR    FL Guid Lumbar Puncture Diagnostic    CSF Cell Count Tube #4 (Order)    CSF IgG Synthesis Rate    CSF Oligoclonal Bands    CSF Glucose    CSF Protein    CSF Encephalitis Antibody Evaluation with Reflex Titer and Line Blot    CBC with Differential (Order)    PT/APTT    Referral to Physical [facility] HOD    Referral to Physical Therapy - EXTERNAL    EMG    Nerve conduction test     No orders of the defined types were placed in this encounter.      I spent 40 minutes reviewing the records, talking with the patient and developing their care plan. This excludes time for separately billed procedures.      Lora Havens, MD PHD  Director, Neuroimmunology & MS Center  [facility]  [address], [place], [place] [address]    Main line / scheduling: [phone]    After-hours Neurologist On Call: [phone]    For direct access, contact our MS Nurse, Allayne Stack RN  The fastest response is via mychart  Tel [phone]  Fax [phone]   ===================================================================  NFL (neurofilament light chain) Insurance Justification  Serum / plasma Neurofilament light chain is a well-established biomarker for monitoring MS, and  regular testing has been incorporated into the Consortium of MS Centers [facility]) guidelines for the monitoring of MS. https://mscare.http://www.stevens.com/     PATIENT INSTRUCTIONS PROVIDED  Patient was given an "After Visit Summary" with a copy of the testing orders, medications and the following other instructions:  Patient Instructions   [facility] MULTIPLE SCLEROSIS & NEUROIMMUNOLOGY CENTER  Main Line / Scheduling / on-call: [phone]  Clinic Tel: [phone])  [phone]  Fax: [phone]  Send [place] a mychart or inbasket message for the fastest response    WELCOME TO THE NEUROIMMUNOLOGY CLINIC!    Lumbar Puncture  A lumbar puncture is the same process as an epidural that pregnant women get, but instead of giving anesthesia, we take some of the "cerebrospinal" fluid and send it to the lab. Most doctors do it at the bedside without any imaging guidance. We've found patients have a much better experience - less pain, trauma and complications - and we get more accurate results when a radiologist does the procedure using X-ray to guide needle placement.    Call [facility] imaging center to schedule the appointment. Call [phone].    You may need an MRI or head CT within 6 months of the LP  You will need lab tests (CBC, PT/INR and APTT) within 30 days of the LP    LP results will take 1 month to return    If needed, COVID Testing Locations: [email or web address]      MULTIPLE SCLEROSIS AND NEUROIMMUNE DISEASE UPDATE  (Updated 11/16/2022)    1) Clinic Procedures  We only do in-person visits   For over 2000 years, medicine has been a face to face, personal enterprise -- and the best medicine happens face to face. Although telemedicine is very convenient (and was necessary during the pandemic), I've seen poor outcomes with telemedicine care. In-person evaluations are critical part of you doing well. As such we do not offer video visits.      Regular, ongoing, follow up visits is also important. Each person is different, but we see the average patient every 3-4  months. We may not refill controlled substances if you haven't been seen within 6 months, and we may not refill other medications if you haven't been seen within 12 months.    Reaching [place]  - Mychart is best. We will respond within a couple of days. (Regular e-mail is illegal)  - My nurses direct phone [phone]. The main neurology line is [phone]. Leave a message and we will get back to you in a few days.  - After hours, the main line [phone]) will page the on-call neurologist.    You will probably hear back from my nurses. I communicate with them throughout the day. The advice they give you is coming from me - please take it seriously.    Emergency / After Hours Care.   If you have questions after hours, call the main number [phone]) and you will reach the on-call neurologist who can help you.    If you have severe, acute or debilitating symptoms, go to the emergency room. But also let us know via mychart or a phone call. I can best help you if you are at an [facility] hospital. You might not have a choice where the ambulance takes you; if you are not at an [facility] hospital, I'll try to help you the best I can. But I probably can't see everything that's going on.    Urgent appointments are available within a few days. My nurses have access to more appointment slots than the call center does.     Testing. Details matter.  The tests I order are specific and high-quality MS care requires advanced technology    - MRI's: we only work with [facility] radiology or [facility] imaging. Some of our patients are enrolled in studies at [facility], and that works as well. If you have had outside MRI's  done previously, please bring the CD's so that we can upload them.     - LABS: Go to the lab indicated on the top of your lab order. Do not take an [facility] lab slip to [facility]- they will do it incorrectly. Do  not go to [facility] or your primary care doctor. Most patients are sent to [facility] lab. If your insurance absolutely doesn't cover [facility] will be sent to [facility].    - Results of testing will be communicated once they all return at follow-up visits. Many of the tests are nuanced and must be interpreted within the overall clinical picture.     2) Doing well with Neuroimmune Disease  Symptoms don't tell you how the disease is doing.  You can experience more symptoms even if the disease isn't worsening. Environmental factors like infections, extremes of heat or cold, stress (emotional or physiologic), fatigue, infections, other medical diseases, and certain foods may make you feel more symptoms. That's not the disease worsening, it's not more neurologic damage, and it's not a relapse. Those are worsening symptoms. Most neuroimmune patients have good days & bad days. You might need symptomatic medications to help you function well.    Also, some diseases worsen without producing more symptoms.     In general, if you have a question about your symptoms or notice any new symptoms, please reach out.      Monitoring the Disease and Medication Response:  MS and neuroimmune diseases are caused by your body's immune system attacking the nervous system (brain, spinal cord, nerves or muscles). We use immune treatments to prevent more damage. We can measure whether those treatments are working through objective testing - labs, MRI's, EMGs, etc.    For MS, NMO or MOG, we know that everyone develop more lesions, neurologic problems on exam and symptoms unless they are treated (Dr Loura Back in [place] proved this). The MRI is one of the best ways of detecting more damage - it shows up as a new lesion. The clinical exam and labs can also help [place]. In MS and MOG, a relapse without symptoms is very common, so we do frequent labs and yearly MRI's to monitor the disease.     For other neuroimmune diseases -- eg,  myasthenia, CIDP/GBS, or  rheumatologic diseases -- not everyone requires treatment. But we use labs, exam, MRI's and/or EMG to monitor the diseases    Medications are the start, but they aren't everything. They won't repair damage that already happened, and they may not help symptoms.     Physical and Cognitive Activity Promote Neurologic Recovery: In order to improve the disease, the brain has to rewire around the damaged nerves. The best way to do this is by being active - physically and mentally. Physical, occupational or cognitive therapy can teach you exercises to address specific problems. Physical activity has been shown to improve energy, mood, and reduce pain. Be as physically and mentally active as you can; learn to pace yourself.    Diet: There is no such thing as an "MS diet" or "anti-inflammatory diet". Foods don't affect MRI lesions or prevent neurologic damage, but a good or bad diet can markedly impact symptoms. My suggestion is to eat healthy - we all know what that is! Diets like the Wahl's protocol or Swank diet have ideas worth trying, but don't feel obligated to follow them precisely. See if adding fiber, reducing gluten, reducing dairy or eliminating processed foods makes a difference in how you feel.,  Vitamins and Supplements  Vitamins can affect neurologic functioning:  - Vitaimn D and MS. High vit D levels correlate with better outcomes in MS patients. Target a total (25-OH) Vit D level between 50-100. Vitamin D3 (CHOLEcalciferol; white pill) works better than Vitamin D2 (ERGOcalciferol; green pill). You can get high doses of vitamin D3 from [facility] (eg, 10,000 or 50,000 units).     - Target vit B12 level above 500. Levels below 400 can cause nerve & spinal cord degeneration. Levels above the normal range are perfectly safe.    - Vitamin B6 (pyridoxine) and zinc toxicity cause neuropathy. Do not take B-complex or zinc. Other sources include multi-vitamins, some energy drinks, and other supplements. Check the back  of the bottle, not just the front. Allena Katz CD. "Pyridoxine toxicity courtesy of your local health food store" Ann Rheum Dis. 2006 Dec;65(12):1666-7]    - marked copper deficiency also causes nerve & spinal cord degeneration    - the data around other supplements like biotin, alpha-lipoic acid, etc is unclear, so I generally don't recommend them    Support Groups  MS Group - [place] (via Zoom) Astrid Divine, for MS, NMO, MOG. Her cell is [phone].  MS Group - Benna Dunks [phone]    National MS Society [facility]): MalpracticeBoard.com.au  Myasthenia Gravis Foundation of [place] (MGFA): MoralGame.si  [employer] for NMO: https://www.sumairafoundation.org/  [employer] for NMO: https://guthyjacksonfoundation.org/  Foundation for Sarcoid Research  DiscFull.com.cy    3) Neurologic Disease, Immune Suppressants and Infections  - MS, NMO, MOG, and GBS/CIDP themselves do not increase your risk of infection (including COVID). Severe myasthenia might increase your risk for lung infections    - Call us if you develop a fever more than 101 F, have a productive cough longer than 2 weeks, or if you have recurring or difficult-to-treat infections. Immune suppressants can increase infection risk. You might need antibiotics, or stronger antibiotics than other people your age.    - None of the MS medications increase your COVID risk above that of an untreated patient (COVIMS study)    - Infections can cause more symptoms. That's usually not disease worsening or neurologic damage from infection. That is symptom worsening, similar to what happens with stress or the heat. You will get better a few days after the infection improves.    - Rarely, COVID can cause neurologic complications. Early on, with severe COVID, we saw a lot of strokes and seizures. We see less of that now. But, we are seeing the emergence of a  post-COVID neurologic syndrome that looks like Guillain-Barre syndrome or transverse myelitis. The way to test for this is with a spinal tap (lumbar puncture). Neuro-COVID is treatable.    4) Vaccines  Safe Vaccines, regardless of immunosuppressant  - Pfizer, Conservation officer, nature COVID vaccines are safe in MS and with all immunosuppressants. (There were issues with J&J and Astra-Zeneca).    - Flu vaccine. Use Flublok brand only (recombinant hemagluttin quadrivalent). This is per FDA guidance for all neurologic patients. It can be difficult to find and we can give you an RX for it.     - Shingles vaccine. Use Shingrix (RZV, recombinant zoster) vaccine. This is per FDA guidance for all neurologic patients.     - All pneumonia vaccines are safe (Prevnar & Capvaxive )    - Tetanus toxoid    Unsafe Vaccines (for any neuroimmune patients)  - Live virus or inactivated virus vaccines are contraindicated in neuro-immunologic  patients on any immunotherapy.    - monkeypox vaccines (JYNNEOS and ACAM2000) are contraindicated in MS and patients on immunosupressants.     - RSV vaccines are associated with cases of Guillian Barre syndrome, so I would avoid them.     - J&J and Astra Zeneca COVID vaccine were associated with transverse myelitis    Notes:  - Mavenclad: The contraindication and timing requirements do not apply to most vaccines. They are only for live virus vaccines, which are contraindicated anwyays    - You don't need to time your vaccinations with infusion dates. There have been theoretical concerns about immunosuppression affecting vaccine effectiveness, but recent research has shown this not to be true.    - MS is compatible with pregnancy but we need to know if you plan on getting pregnant or become pregnant as we may need to adjust your treatment. Certain disease modifying treatments for MS are better than others for those seeking to become pregnant.       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 Myelopathy  G95.9 EMG     Nerve conduction test     Referral to Physical [facility] HOD     Referral to Physical Therapy - EXTERNAL     FL Guid Lumbar Puncture Diagnostic     CSF Cell Count Tube #4 (Order)     CSF IgG Synthesis Rate     CSF Oligoclonal Bands     CSF Glucose     CSF Protein     CSF Encephalitis Antibody Evaluation with Reflex Titer and Line Blot     CSF JC Polyoma Virus DNA, Qualitative Real-Time PCR     CBC with Differential (Order)     PT/APTT      3. Vitamin B12 deficiency  E53.8       4. Vitamin D deficiency  E55.9       5. Neurologic gait dysfunction  R26.9 Referral to Physical [facility] HOD     Referral to Physical Therapy - EXTERNAL        Orders Placed This Encounter   Procedures    CSF JC Polyoma Virus DNA, Qualitative Real-Time PCR  Standing Status:   Future     Standing Expiration Date:   06/20/2024     Order Specific Question:   Release to patient      Answer:   Immediate    FL Guid Lumbar Puncture Diagnostic     Include cytologic analysis     Standing Status:   Future     Standing Expiration Date:   06/20/2024     Order Specific Question:   Reason for Exam:     Answer:   MS evaluation. CSF orders in EPIC.     Order Specific Question:   Release to patient     Answer:   Immediate    CSF Cell Count Tube #4 (Order)     Standing Status:   Future     Standing Expiration Date:   06/20/2024     Order Specific Question:   Specimen type/source/volume     Answer:   include manual diff     Order Specific Question:   Release to patient     Answer:   Immediate    CSF IgG Synthesis Rate     Standing Status:   Future     Standing Expiration Date:   06/20/2024     Order Specific Question:   Release to patient     Answer:   Immediate    CSF Oligoclonal Bands     csf     Standing Status:   Future     Standing Expiration Date:   06/20/2024     Order Specific Question:   Specimen type/source/volume     Answer:   include serum comparator     Order Specific Question:   Release to patient     Answer:   Immediate    CSF Glucose     Standing Status:   Future     Standing Expiration Date:   06/20/2024     Order Specific Question:   Specimen type/source/volume     Answer:   csf     Order Specific Question:   Release to patient     Answer:   Immediate    CSF Protein     Standing Status:   Future     Standing Expiration Date:   06/20/2024     Order Specific Question:   Specimen type/source/volume     Answer:   csf     Order Specific Question:   Release to patient     Answer:   Immediate    CSF Encephalitis Antibody Evaluation with Reflex Titer and Line Blot     Standing Status:   Future     Standing Expiration Date:   06/20/2024     Order Specific Question:   Release to patient     Answer:   Immediate    CBC with Differential (Order)     Standing Status:   Future     Standing Expiration Date:   06/20/2024     Order Specific Question:   Release to patient     Answer:   Immediate    PT/APTT      Standing Status:   Future     Standing Expiration Date:   06/20/2024     Order Specific Question:   Release to patient     Answer:   Immediate    Referral to Physical [facility] HOD     Standing Status:   Future     Standing Expiration Date:   06/20/2024  Referral Priority:   Routine     Referral Type:   Consultation     Referral Reason:   Services Requested     Requested Specialty:   Physical Therapy     Number of Visits Requested:   1    Referral to Physical Therapy - EXTERNAL     Standing Status:   Future     Standing Expiration Date:   06/20/2024     Referral Priority:   Routine     Referral Type:   Consultation     Referral Reason:   Services Requested     Requested Specialty:   Physical Therapy     Number of Visits Requested:   1    EMG     Standing Status:   Future     Standing Expiration Date:   06/20/2024     Order Specific Question:   Reason for Exam     Answer:   both legs. evaluate subacute combined degeneration    Nerve conduction test     Standing Status:   Future     Standing Expiration Date:   06/20/2024     Order Specific Question:   Reason for Exam     Answer:   both legs. evaluate subacute combined degeneration

## 2023-06-21 NOTE — Patient Instructions (Signed)
[place] MULTIPLE SCLEROSIS & NEUROIMMUNOLOGY CENTER  Main Line / Scheduling / on-call: [phone]  Clinic Tel: [phone]  Fax: [phone]  Send [place] a mychart or inbasket message for the fastest response    WELCOME TO THE NEUROIMMUNOLOGY CLINIC!    Lumbar Puncture  A lumbar puncture is the same process as an epidural that pregnant women get, but instead of giving anesthesia, we take some of the "cerebrospinal" fluid and send it to the lab. Most doctors do it at the bedside without any imaging guidance. We've found patients have a much better experience - less pain, trauma and complications - and we get more accurate results when a radiologist does the procedure using X-ray to guide needle placement.    Call [facility] imaging center to schedule the appointment. Call [phone].    You may need an MRI or head CT within 6 months of the LP  You will need lab tests (CBC, PT/INR and APTT) within 30 days of the LP    LP results will take 1 month to return    If needed, COVID Testing Locations: [email or web address]      MULTIPLE SCLEROSIS AND NEUROIMMUNE DISEASE UPDATE  (Updated 11/16/2022)    1) Clinic Procedures  We only do in-person visits   For over 2000 years, medicine has been a face to face, personal enterprise -- and the best medicine happens face to face. Although telemedicine is very convenient (and was necessary during the pandemic), I've seen poor outcomes with telemedicine care. In-person evaluations are critical part of you doing well. As such we do not offer video visits.     Regular, ongoing, follow up visits is also important. Each person is different, but we see the average patient every 3-4  months. We may not refill controlled substances if you haven't been seen within 6 months, and we may not refill other medications if you haven't been seen within 12 months.    Reaching [place]  - Mychart is best. We will respond within a couple of days.  (Regular e-mail is illegal)  - My nurses direct phone [phone]. The main neurology line is [phone]. Leave a message and we will get back to you in a few days.  - After hours, the main line [phone]) will page the on-call neurologist.    You will probably hear back from my nurses. I communicate with them throughout the day. The advice they give you is coming from me - please take it seriously.    Emergency / After Hours Care.   If you have questions after hours, call the main number [phone]) and you will reach the on-call neurologist who can help you.    If you have severe, acute or debilitating symptoms, go to the emergency room. But also let us know via mychart or a phone call. I can best help you if you are at an [place] hospital. You might not have a choice where the ambulance takes you; if you are not at an [facility] hospital, I'll try to help you the best I can. But I probably can't see everything that's going on.    Urgent appointments are available within a few days. My nurses have access to more appointment slots than the call center does.     Testing. Details matter.  The tests I order are specific and high-quality MS care requires advanced technology    - MRI's: we only work with [facility] radiology or [place] imaging. Some of  our patients are enrolled in studies at [facility], and that works as well. If you have had outside MRI's done previously, please bring the CD's so that we can upload them.     - LABS: Go to the lab indicated on the top of your lab order. Do not take an [place] lab slip to [facility]- they will do it incorrectly. Do not go to [facility] or your primary care doctor. Most patients are sent to [facility] lab. If your insurance absolutely doesn't cover [place] will be sent to [facility].    - Results of testing will be communicated once they all return at follow-up visits. Many of the tests are nuanced and must be interpreted within the overall clinical picture.     2) Doing well with Neuroimmune  Disease  Symptoms don't tell you how the disease is doing.  You can experience more symptoms even if the disease isn't worsening. Environmental factors like infections, extremes of heat or cold, stress (emotional or physiologic), fatigue, infections, other medical diseases, and certain foods may make you feel more symptoms. That's not the disease worsening, it's not more neurologic damage, and it's not a relapse. Those are worsening symptoms. Most neuroimmune patients have good days & bad days. You might need symptomatic medications to help you function well.    Also, some diseases worsen without producing more symptoms.     In general, if you have a question about your symptoms or notice any new symptoms, please reach out.      Monitoring the Disease and Medication Response:  MS and neuroimmune diseases are caused by your body's immune system attacking the nervous system (brain, spinal cord, nerves or muscles). We use immune treatments to prevent more damage. We can measure whether those treatments are working through objective testing - labs, MRI's, EMGs, etc.    For MS, NMO or MOG, we know that everyone develop more lesions, neurologic problems on exam and symptoms unless they are treated (Dr Loura Back in [place] proved this). The MRI is one of the best ways of detecting more damage - it shows up as a new lesion. The clinical exam and labs can also help [place]. In MS and MOG, a relapse without symptoms is very common, so we do frequent labs and yearly MRI's to monitor the disease.     For other neuroimmune diseases -- eg,  myasthenia, CIDP/GBS, or rheumatologic diseases -- not everyone requires treatment. But we use labs, exam, MRI's and/or EMG to monitor the diseases    Medications are the start, but they aren't everything. They won't repair damage that already happened, and they may not help symptoms.     Physical and Cognitive Activity Promote Neurologic Recovery: In order to improve the disease, the brain has  to rewire around the damaged nerves. The best way to do this is by being active - physically and mentally. Physical, occupational or cognitive therapy can teach you exercises to address specific problems. Physical activity has been shown to improve energy, mood, and reduce pain. Be as physically and mentally active as you can; learn to pace yourself.    Diet: There is no such thing as an "MS diet" or "anti-inflammatory diet". Foods don't affect MRI lesions or prevent neurologic damage, but a good or bad diet can markedly impact symptoms. My suggestion is to eat healthy - we all know what that is! Diets like the Wahl's protocol or Swank diet have ideas worth trying, but don't feel obligated to follow them precisely.  See if adding fiber, reducing gluten, reducing dairy or eliminating processed foods makes a difference in how you feel.,    Vitamins and Supplements  Vitamins can affect neurologic functioning:  - Vitaimn D and MS. High vit D levels correlate with better outcomes in MS patients. Target a total (25-OH) Vit D level between 50-100. Vitamin D3 (CHOLEcalciferol; white pill) works better than Vitamin D2 (ERGOcalciferol; green pill). You can get high doses of vitamin D3 from [facility] (eg, 10,000 or 50,000 units).     - Target vit B12 level above 500. Levels below 400 can cause nerve & spinal cord degeneration. Levels above the normal range are perfectly safe.    - Vitamin B6 (pyridoxine) and zinc toxicity cause neuropathy. Do not take B-complex or zinc. Other sources include multi-vitamins, some energy drinks, and other supplements. Check the back of the bottle, not just the front. Allena Katz CD. "Pyridoxine toxicity courtesy of your local health food store" Ann Rheum Dis. 2006 Dec;65(12):1666-7]    - marked copper deficiency also causes nerve & spinal cord degeneration    - the data around other supplements like biotin, alpha-lipoic acid, etc is unclear, so I generally don't recommend them    Support Groups  MS  Group - [place] (via Zoom) Astrid Divine, for MS, NMO, MOG. Her cell is [phone].  MS Group - Benna Dunks [phone]    National MS Society [facility]): MalpracticeBoard.com.au  Myasthenia Gravis Foundation of [place] (MGFA): MoralGame.si  [employer] for NMO: https://www.sumairafoundation.org/  [employer] for NMO: https://guthyjacksonfoundation.org/  Foundation for Sarcoid Research  DiscFull.com.cy    3) Neurologic Disease, Immune Suppressants and Infections  - MS, NMO, MOG, and GBS/CIDP themselves do not increase your risk of infection (including COVID). Severe myasthenia might increase your risk for lung infections    - Call us if you develop a fever more than 101 F, have a productive cough longer than 2 weeks, or if you have recurring or difficult-to-treat infections. Immune suppressants can increase infection risk. You might need antibiotics, or stronger antibiotics than other people your age.    - None of the MS medications increase your COVID risk above that of an untreated patient (COVIMS study)    - Infections can cause more symptoms. That's usually not disease worsening or neurologic damage from infection. That is symptom worsening, similar to what happens with stress or the heat. You will get better a few days after the infection improves.    - Rarely, COVID can cause neurologic complications. Early on, with severe COVID, we saw a lot of strokes and seizures. We see less of that now. But, we are seeing the emergence of a post-COVID neurologic syndrome that looks like Guillain-Barre syndrome or transverse myelitis. The way to test for this is with a spinal tap (lumbar puncture). Neuro-COVID is treatable.    4) Vaccines  Safe Vaccines, regardless of immunosuppressant  - Pfizer, Conservation officer, nature COVID vaccines are safe in MS and with all immunosuppressants. (There were issues with J&J and  Astra-Zeneca).    - Flu vaccine. Use Flublok brand only (recombinant hemagluttin quadrivalent). This is per FDA guidance for all neurologic patients. It can be difficult to find and we can give you an RX for it.     - Shingles vaccine. Use Shingrix (RZV, recombinant zoster) vaccine. This is per FDA guidance for all neurologic patients.     - All pneumonia vaccines are safe (Prevnar & Capvaxive )    - Tetanus  toxoid    Unsafe Vaccines (for any neuroimmune patients)  - Live virus or inactivated virus vaccines are contraindicated in neuro-immunologic patients on any immunotherapy.    - monkeypox vaccines (JYNNEOS and ACAM2000) are contraindicated in MS and patients on immunosupressants.     - RSV vaccines are associated with cases of Guillian Barre syndrome, so I would avoid them.     - J&J and Astra Zeneca COVID vaccine were associated with transverse myelitis    Notes:  - Mavenclad: The contraindication and timing requirements do not apply to most vaccines. They are only for live virus vaccines, which are contraindicated anwyays    - You don't need to time your vaccinations with infusion dates. There have been theoretical concerns about immunosuppression affecting vaccine effectiveness, but recent research has shown this not to be true.    - MS is compatible with pregnancy but we need to know if you plan on getting pregnant or become pregnant as we may need to adjust your treatment. Certain disease modifying treatments for MS are better than others for those seeking to become pregnant.       Thank you,      Lora Havens, MD PHD  Director, Neuroimmunology & MS Center  [facility]  [address], [place], [place] [address]    Main line / scheduling: [phone]    After-hours Neurologist On Call: [phone]    For direct access, contact our MS Nurse, Allayne Stack RN  The fastest response is via [place]  Tel [phone]  Fax [phone]

## 2023-07-11 NOTE — Progress Notes (Signed)
pt is complaining of chest pain 8/10, she said it feels tight, doesn't radiate to arm or jaw. She feels it intermittently, once every hour. She pointed at it and it was epigastric then she changed it and said it's more to the left. ECG done. BP 159/68 HR 88. Dr Donnelly Angelica updated. See new orders.

## 2023-07-12 NOTE — Progress Notes (Signed)
Complain of substernal chest pain and occasionally radiate to back. Denies SOB.  Pain reproducible.     EKG  completed and no new ischemic change.  Trop negative.  For elevated BP Hydralazine 10 mg IV ordered.

## 2023-07-12 NOTE — Progress Notes (Signed)
[facility] Congestive Heart Failure Navigator    Admit: 07/11/2023   Length of Stay: 0 day(s)  Expected Discharge Date: Jul 12, 2023  Length of Stay: 0 day(s)    Admit Dx: COPD with acute exacerbation (HCC) [J44.1]  Diastolic heart failure (HCC) [I50.30]   Room #:  PACT/PACT-07  Chart reviewed.  PCP: [name] Media, MD  Cardiology:   EF:    Medical record reviewed for Patient Navigator Heart Failure Consult. Bedside education provided to patient prior to discharge. Basic overview of HF discussed focused on diastolic. Pt reports that she has a scale at home for daily weights. She eats very little fast food but may rarely go to [employer]. She does admit to enjoying [place] food. Reviewed specific sodium content of soy sauce and some popular [place] food dishes.  Pt receptive to education and asked appropriate questions demonstrating good understanding of info provided.    Provided Living Better with Heart Failure toolkit & [employer] HF Placemat and emphasized the following education below:   Provided the following additional handouts:   Living Better with Heart Failure toolkit to reinforce all topics discussed above.  Handout listing no-salt options from [employer]  Handout cautioning against salt substitutes with high potassium content  AHA handout "The Salty Six." & NHLBI "Tips on What to Eat vs. Limit DASH eating plan handout  Provided daily weight log    Daily instructions to manage Heart Failure  1.  Eat a LOW SODIUM diet (Limit of 2000 mg of sodium a day)  2.  Weigh daily and keep a log.  3.  Alert a health care provider for weight gain of 3 pounds in a day, 5 pounds in a week, increased swelling, or increased shortness of breath.  4.  Take your medications as ordered by your healthcare provider. DO NOT STOP or CHANGE any medicine without talking to your doctor FIRST.  5.  DO NOT SMOKE. Smoking effects blood pressure, heart rate and worsens heart failure symptoms.  PLEASE BRING YOUR WEIGHT LOG AND UPDATED  MEDICINE LIST TO YOUR FOLLOW UP APPOINTMENTS    PLEASE MAKE SURE TO ATTEND A FOLLOW UP APPOINTMENT WITH YOUR HEALTH CARE PROVIDER WITHIN 7 DAYS AFTER [name]    Explained that fluid restriction in hospital has been per day. Instructed to discuss with MD whether the should be continued at discharge or if there is another recommended amount.  Pt states she will make a f/u visit with her cardiologist, Dr. Aneta [name], for fluid restriction guidance .   Reviewed symptoms of uncontrolled CHF YELLOW ZONE such as increased shortness of breath, dry hacking cough, 3 or more pound weight gain in a day, 5 or more pound weight gain in a week, edema, fatigue, and inability to lie down flat. CALL YOUR HEALTHCARE PROVIDER IF THESE OCCUR.    Reviewed symptoms of uncontrolled CHF RED ZONE such as struggling to breathe, chest pain, or confusion. CALL 911 AND GET TO EMERGENCY ROOM--DO NOT DRIVE.    [name] BSN, RN  Cardiac Navigator  [phone]    Wt Readings from Last 3 Encounters:   07/11/23 93 kg (205 lb)   07/03/23 93 kg (205 lb)   06/06/23 96.4 kg (212 lb 9.6 oz)         Hemoglobin A1c   Date Value Ref Range Status   06/06/2023 6.3 (H) 4.8 - 5.6 % Final           Past Medical History:  Diagnosis Date   . Acute, but ill-defined, cerebrovascular disease     TIA   . Arthropathy     knees   . Asthma    . Bipolar disorder (HCC)    . Cardiac arrhythmia     h/o Afib on Eliquis   . Cerebral artery occlusion with cerebral infarction [facility])     CVA post endodartorectomy   . Chest pain     has history multiple admissions for this, stress test and cardiac cath negative   . Chronic anemia     Hgb ~10   . COPD (chronic obstructive pulmonary disease) (HCC)    . Cough    . COVID    . COVID-19 vaccine series completed    . CVA (cerebral vascular accident) (HCC) 09/01/2021   . Deficiency anemia 11/2020    rx with Iron Infusions   . Epilepsy (HCC)     per pt last Sz was approx 9-10 months ago   . Esophageal reflux     . Headache    . 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 Hemorrhoids    . History of blood transfusion     following one of her pregnancy   . History of cardiac catheterization no obstructive cad in 2017    . Hyperlipidemia    . Hyperlipidemia    . Hypertension    . Lower leg edema    . Migraines    . Paroxysmal atrial fibrillation (HCC)     on anticoagulation   . Seizure-like activity (HCC)     EEG negative   . Stiff person syndrome    . Syncope     recurrent, has loop recorder   . Thoracic aortic aneurysm (HCC)    . TIA (transient ischemic attack)     multiple since age 36's per patient. Multiple MRI brain in the past negative for evidence of stroke. Suspected complicated migraine.   . Type 2 diabetes mellitus (HCC)     not on medication   . Unable to coordinate sucking, swallowing, and breathing 09/28/2019      Past Surgical History:   Procedure Laterality Date   . BREAST BIOPSY Right 06/18/2021    right ultrasound core biopsy with clip placed   . CARDIAC CATH  2016, 02/2016   . CAROTID ENDARTERECTOMY     . COLONOSCOPY N/A 07/01/2013    Procedure: COLONOSCOPY;  Surgeon: Earlean Shawl, MD;  [facility] ENDO OR LOC;  Service: Endoscopy GI;  Laterality: N/A;   . COLONOSCOPY N/A 04/13/2015    Procedure: COLONOSCOPY FLEXIBLE;  Surgeon: Posey Rea, MD;  Location: [facility] ENDO OR LOC;  Service: Endoscopy GI;  Laterality: N/A;   . COLONOSCOPY WITH BIOPSY N/A 04/13/2015    Procedure: COLONOSCOPY FLEXIBLE WITH BIOPSY;  Surgeon: Posey Rea, MD;  Location: [facility] ENDO OR LOC;  Service: Endoscopy GI;  Laterality: N/A;   . COLONOSCOPY WITH BIOPSY N/A 01/31/2017    Procedure: COLONOSCOPY FLEXIBLE WITH BIOPSY;  Surgeon: Brett Canales, MD   . COLONOSCOPY WITH BIOPSY N/A 05/29/2017    Procedure: COLONOSCOPY, WITH BIOPSY;  Surgeon: Gretta Cool, MD   . COLONOSCOPY WITH BIOPSY N/A 05/29/2017    Procedure: COLONOSCOPY, WITH BIOPSY;   Surgeon: Gretta Cool, MD   . EGD N/A 07/01/2013    Procedure: ESOPHAGOGASTRODUODENOSCOPY;  Surgeon: Earlean Shawl, MD;  Location: [facility] ENDO OR LOC;  Service: Endoscopy GI;  Laterality: N/A;   . EGD N/A  08/08/2014    Procedure: ESOPHAGOGASTRODUODENOSCOPY FLEXIBLE TRANSORAL DIAGNOSTIC;  Surgeon: Phylis Bougie, MD;  Location: [facility] ENDO OR LOC;  Service: Endoscopy GI;  Laterality: N/A;   . EGD WITH BIOPSY N/A 06/07/2016    Procedure: ESOPHAGOGASTRODUODENOSCOPY FLEXIBLE TRANSORAL WITH BIOPSY;  Surgeon: Brett Canales, MD   . EGD WITH BIOPSY N/A 03/20/2020    Procedure: EGD, WITH BIOPSY;  Surgeon: Gretta Cool, MD   . EGD WITH BLEEDING CONTROL N/A 05/29/2017    Procedure: EGD, WITH HEMORRHAGE CONTROL;  Surgeon: Gretta Cool, MD   . EXCISION OF BENIGN LESION Right 10/19/2021    Procedure: EXCISION, LESION, BENIGN, BREAST;  Surgeon: Eulis Manly, MD   . HEART CATHETERIZATION     . HYSTERECTOMY     . LOOP ELECTROSURGICAL EXCISON PROCEDURE     . LOOP RECORDER  2018   . OTHER Right 06/18/2021    axillary lymph node biopsy with clip placed   . OTHER  05/2021    Left leg ablation procedure   . VENOUS ACCESS CATHETER INSERTION Right 03/29/2019     Allergies   Allergen Reactions   . Montelukast rash/itching and swelling   . Nitroglycerin hives and rash/itching   . Immune Globulin (Human) (Igg) swelling and rash/itching     Pt complain of itchiness and swelling of lips and tongue. Saturation fine, bp elevated.   . Magnesium Sulfate rash/itching   . Amoxicillin hives     Has tolerated ceftriaxone, cephalexin   . Atorvastatin unknown and other/intolerance     Rhabdomyolysis   Pt states she thinks it caused her to have rhabdo   . Bumex [Bumetanide] muscle/joint pain   . Ceftriaxone rash/itching   . Magnesium Chloride rash/itching   . Sulfamethoxazole-Trimethoprim rash/itching   . Iodinated Contrast [name] swelling     Throat swelling and lip itching   . Penicillins hives     Has tolerated cephalexin   . Tetracyclines  hives   . Latex, Natural Rubber rash/itching and hives   . Kit Prep Of Tc-8m-Tetrofosmin unknown      Home Medication List - Marked as Reviewed on 07/11/23 1717   Medication Sig   Ammonium Lactate 12 % Top CREA PLEASE SEE ATTACHED FOR DETAILED DIRECTIONS   apixaban (ELIQUIS) 5 mg PO TABS Take 1 Tab by Mouth Twice Daily.   dapagliflozin propanediol (FARXIGA) 10 mg PO TABS Take 1 Tab by Mouth Once a Day.   diazePAM (VALIUM) 5 mg PO TABS Take 1 Tab by Mouth 2 (two) times a day.   diclofenac sodium 1 % Top GEL USE 4 GRAM TO AFFECTED AREA 4 TIMES DAILY.   EPINEPHrine (EPIPEN) 0.3 mg/0.3 mL Inj AtIn Inject 0.3 mL into the muscle Take As Needed for Other (ANAPHYLAXIS). Q 10-30 minutes prn for ANAPHYLAXIS ONLY   gabapentin (NEURONTIN) 100 mg PO CAPS Take 1 Cap by Mouth 3 Times Daily.   hydrOXYzine pamoate (VISTARIL) 50 mg PO CAPS Take 1 Cap by Mouth 2 Times Daily As Needed.   lamoTRIgine (LAMICTAL) 100 mg PO TABS Take 1 Tab by Mouth Once a Day. Indications: epilepsy   lurasidone (LATUDA) 40 mg PO TABS TAKE 1 TABLET DAILY WITH FOOD (AT LEAST 350 CALORIES)   lurasidone 120 mg PO TABS Take 1 Tab by Mouth Once a Day.   metoprolol XL (TOPROL XL) 25 mg PO TB24 Take 1 Tab by Mouth Once a Day.   pantoprazole (PROTONIX) 40 mg PO TBEC take 1 tablet by mouth twice a day  rOPINIRole (REQUIP) 0.5 mg PO TABS TAKE 2 TABS BY MOUTH EVERY NIGHT AT BEDTIME.   torsemide (DEMADEX) 20 mg PO TABS Take 1 Tab by Mouth Once a Day.   traMADoL (ULTRAM) 50 mg PO TABS Take 1 Tab by Mouth 2 Times Daily As Needed. for pain             Intake/Output Summary (Last 24 hours) at 07/12/2023 1538  Last data filed at 07/12/2023 1300  Gross per 24 hour   Intake 712 ml   Output 1000 ml   Net -288 ml        Follow Up Info (next 45 days)       Follow up with [name] Media, MD    Specialty: Family Practice    Phone: [phone]    Where: [facility]      Wednesday Jul 26, 2023    My Health Care Coordinator Outreach Visit with Haze Justin at  7:00 AM     Where: MyHealth Care Coordination [facility])      [facility]  [address]  [place] [address]  [phone]             Disposition:           [name] BSN, RN  Cardiac Navigator  [phone]

## 2023-07-12 NOTE — Progress Notes (Signed)
Pt discharged home. PIV removed. AVS reviewed and given to pt. All belongings with pt

## 2023-07-21 ENCOUNTER — Ambulatory Visit: Payer: Medicare Other | Admitting: Neurology

## 2023-08-01 ENCOUNTER — Ambulatory Visit: Payer: Medicare Other | Admitting: Neurology

## 2023-08-08 ENCOUNTER — Other Ambulatory Visit: Payer: Self-pay | Admitting: Neurology

## 2023-08-09 LAB — CBC AND DIFFERENTIAL
Baso(Absolute): 0 10*3/uL (ref 0.0–0.2)
Basophils Automated: 1 %
Eosinophils Absolute: 0.3 10*3/uL (ref 0.0–0.4)
Eosinophils Automated: 4 %
Hematocrit: 37.6 % (ref 34.0–46.6)
Hemoglobin: 11.9 g/dL (ref 11.1–15.9)
Immature Granulocytes Absolute: 0 10*3/uL (ref 0.0–0.1)
Immature Granulocytes: 1 %
Lymphocytes Absolute: 2.5 10*3/uL (ref 0.7–3.1)
Lymphocytes Automated: 41 %
MCH: 26.1 pg — ABNORMAL LOW (ref 26.6–33.0)
MCHC: 31.6 g/dL (ref 31.5–35.7)
MCV: 83 fL (ref 79–97)
Monocytes Absolute: 0.7 10*3/uL (ref 0.1–0.9)
Monocytes: 11 %
Neutrophils Absolute Count: 2.6 10*3/uL (ref 1.4–7.0)
Neutrophils: 42 %
Platelets: 289 10*3/uL (ref 150–450)
RBC: 4.56 x10E6/uL (ref 3.77–5.28)
RDW: 14.2 % (ref 11.7–15.4)
WBC: 6.1 10*3/uL (ref 3.4–10.8)

## 2023-08-10 ENCOUNTER — Ambulatory Visit
Admission: RE | Admit: 2023-08-10 | Discharge: 2023-08-10 | Disposition: A | Discharge status: Home / Self Care | Payer: Medicare Other | Source: Ambulatory Visit | Attending: Neurology | Admitting: Neurology

## 2023-08-10 ENCOUNTER — Telehealth: Payer: Self-pay | Admitting: Neurology

## 2023-08-10 DIAGNOSIS — E538 Deficiency of other specified B group vitamins: Secondary | ICD-10-CM | POA: Insufficient documentation

## 2023-08-10 DIAGNOSIS — G959 Disease of spinal cord, unspecified: Secondary | ICD-10-CM | POA: Insufficient documentation

## 2023-08-10 DIAGNOSIS — G320 Subacute combined degeneration of spinal cord in diseases classified elsewhere: Secondary | ICD-10-CM | POA: Insufficient documentation

## 2023-08-10 LAB — LAB USE ONLY - CSF CELL DIFFERENTIAL TUBE #4
CSF Lymphocytes Tube #4: 14
CSF Neutrophils Tube #4: 1

## 2023-08-10 LAB — SPECIMEN STATUS REPORT

## 2023-08-10 LAB — LAB USE ONLY - CSF CELL COUNT TUBE #4
CSF RBC Count Tube #4: 4 /mm3 — ABNORMAL HIGH (ref 0–3)
CSF WBC Count Tube #4: 0 /mm3 (ref 0–5)

## 2023-08-10 LAB — CSF GLUCOSE: CSF Glucose: 66 mg/dL (ref 40–70)

## 2023-08-10 LAB — PT/INR
INR: 1.1 (ref 0.9–1.1)
PT: 12.9 s (ref 10.1–12.9)

## 2023-08-10 LAB — CSF PROTEIN: CSF Protein: 34 mg/dL (ref 15.0–40.0)

## 2023-08-10 MED ORDER — LIDOCAINE HCL 1 % IJ SOLN
5.0000 mL | Freq: Once | INTRAMUSCULAR | Status: AC
Start: 2023-08-10 — End: 2023-08-10
  Administered 2023-08-10: 5 mL via INTRADERMAL

## 2023-08-10 NOTE — Telephone Encounter (Signed)
[facility] called on the patient behalf to confirm is patient shoukd get any other blood work other than the CBC AND APTT. I confirmed a call back number for any additional questions.    Ph:[phone]

## 2023-08-11 LAB — DRVVT MIX RFX: Dilute Viper Venom 1:1 Mix: 41 s — ABNORMAL HIGH (ref 0.0–40.4)

## 2023-08-11 LAB — PROLONGED APTT, PLASMA
Dilute Viper Venom Time: 47.6 s — ABNORMAL HIGH (ref 0.0–47.0)
Factor IX Activity: 183 % — ABNORMAL HIGH (ref 60–177)
Factor VIII Activity: 156 % — ABNORMAL HIGH (ref 56–140)
Factor XI Activity: 145 % (ref 60–150)
Factor XII Activity: 125 % (ref 50–150)
PTT Lupus Anticoagulant: 41.8 s (ref 0.0–43.5)

## 2023-08-11 LAB — MEDICAL CYTOLOGY

## 2023-08-11 LAB — DRVVT CONFIRMATION (REFLEX): dRVVT Confirmation: 1.1 {ratio} (ref 0.8–1.2)

## 2023-08-11 NOTE — Telephone Encounter (Signed)
Return call placed to [facility]  Spoke to Leonard  Confirmed ordered labs

## 2023-08-12 ENCOUNTER — Other Ambulatory Visit (INDEPENDENT_AMBULATORY_CARE_PROVIDER_SITE_OTHER): Payer: Medicare Other

## 2023-08-12 LAB — CSF IGG SYNTHESIS RATE
Albumin: 3.2 g/dL — ABNORMAL LOW (ref 3.6–5.1)
CSF Albumin: 21.5 mg/dL (ref 8.0–42.0)
CSF IgG Index: 0.39 (ref <0.70)
CSF IgG: 2.6 mg/dL (ref 0.8–7.7)
CSF Synthesis Rate: -5.3 mg/(24.h) (ref ?–9.9)
Immunoglobulin G: 984 mg/dL (ref 600–1540)

## 2023-08-12 LAB — CSF JC POLYOMA VIRUS DNA, QUALITATIVE REAL-TIME PCR: CSF JC Polyoma Virus DNA, Qualitative: NOT DETECTED

## 2023-08-15 LAB — CSF OLIGOCLONAL BANDS: CSF Oligoclonal Bands: ABSENT

## 2023-08-19 LAB — CSF ENCEPHALITIS ANTIBODY EVALUATION WITH REFLEX TITER AND LINE BLOT
CSF AGNA/SOX1 Antibody, IFA: NEGATIVE
CSF AMPAR1 Antibody, CBA: NEGATIVE
CSF AMPAR2 Antibody, CBA: NEGATIVE
CSF ANNA1 (Hu) Antibody, IFA: NEGATIVE
CSF ANNA2 (Ri) Antibody, IFA: NEGATIVE
CSF ANNA3 Antibody, IFA: NEGATIVE
CSF Amphiphysin Antibody, IFA: NEGATIVE
CSF Aquaporin-4 Antibody IGG, CBA: NEGATIVE
CSF Aquaporin-4 Antibody, IFA: NEGATIVE
CSF CASPR2 Antibody, CBA: NEGATIVE
CSF CRMP5/CV2 Antibody, IFA: NEGATIVE
CSF DPPX Antibody, CBA: NEGATIVE
CSF GABABR Antibody, CBA: NEGATIVE
CSF GAD65 Antibody, IFA: NEGATIVE
CSF LGI1 Antibody, CBA: NEGATIVE
CSF MA2/TA AB, IFA: NEGATIVE
CSF MYELIN Antibody, IFA: NEGATIVE
CSF NMDAR1 Antibody, CBA: NEGATIVE
CSF PCA-Tr (DNER) Antibody, IFA: NEGATIVE
CSF PCA1 (Yo) Antibody, IFA: NEGATIVE
CSF PCA2 Antibody, IFA: NEGATIVE
CSF VGKC Antibody: <20 pmol/L (ref <20)

## 2023-08-21 ENCOUNTER — Ambulatory Visit: Payer: Medicare Other | Admitting: Neurology

## 2023-08-30 NOTE — ED Provider Notes (Signed)
 [facility] [place]  [facility] EMERGENCY DEPARTMENT  [address]  [place] [address]  Dept: [phone]  Loc Appt: [phone]  Loc: [phone]            MDM / DDX / ED Course   67 y.o. female presents

## 2023-09-08 ENCOUNTER — Ambulatory Visit: Payer: Medicare Other | Attending: Neurology | Admitting: Neurology

## 2023-09-08 ENCOUNTER — Encounter: Payer: Self-pay | Admitting: Neurology

## 2023-09-08 DIAGNOSIS — G320 Subacute combined degeneration of spinal cord in diseases classified elsewhere: Secondary | ICD-10-CM | POA: Insufficient documentation

## 2023-09-08 DIAGNOSIS — G959 Disease of spinal cord, unspecified: Secondary | ICD-10-CM | POA: Insufficient documentation

## 2023-09-08 DIAGNOSIS — E538 Deficiency of other specified B group vitamins: Secondary | ICD-10-CM | POA: Insufficient documentation

## 2023-09-08 DIAGNOSIS — G609 Hereditary and idiopathic neuropathy, unspecified: Secondary | ICD-10-CM | POA: Insufficient documentation

## 2023-09-08 NOTE — Progress Notes (Signed)
 [facility] - Neurology  [address]  [place], [place]  [address]  Phone: [phone]     Fax: [phone]          Full Name: [name] Diabetes: Y  MRN#: [id number] Anti-Coag: Eloquis  Gender: Female Pacemaker: N  Date of Birth: [birth year]

## 2023-09-19 ENCOUNTER — Other Ambulatory Visit: Payer: Self-pay | Admitting: Neurology

## 2023-09-19 ENCOUNTER — Ambulatory Visit: Payer: Medicare Other | Attending: Neurology | Admitting: Neurology

## 2023-09-19 ENCOUNTER — Encounter: Payer: Self-pay | Admitting: Neurology

## 2023-09-19 VITALS — BP 116/74 | HR 84 | Resp 17 | Ht 65.0 in | Wt 210.0 lb

## 2023-09-19 DIAGNOSIS — G959 Disease of spinal cord, unspecified: Secondary | ICD-10-CM | POA: Insufficient documentation

## 2023-09-19 DIAGNOSIS — G9589 Other specified diseases of spinal cord: Secondary | ICD-10-CM | POA: Insufficient documentation

## 2023-09-19 DIAGNOSIS — G320 Subacute combined degeneration of spinal cord in diseases classified elsewhere: Secondary | ICD-10-CM | POA: Insufficient documentation

## 2023-09-19 DIAGNOSIS — E538 Deficiency of other specified B group vitamins: Secondary | ICD-10-CM | POA: Insufficient documentation

## 2023-09-19 MED ORDER — MYCOPHENOLATE SODIUM 360 MG PO TBEC
DELAYED_RELEASE_TABLET | ORAL | 0 refills | Status: DC
Start: 2023-09-19 — End: 2024-01-08

## 2023-09-19 NOTE — Progress Notes (Signed)
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       EXAM  Visit Vitals  BP 116/74 (BP Site: Left arm, Patient Position: Sitting, Cuff Size: Large)   Pulse 84   Resp 17   Ht 1.651 m (5\' 5" )   Wt 95.3 kg (210 lb)   LMP  (LMP Unknown)   SpO2 98%   BMI 34.95 kg/m     General:   Optic Nerves:   Psychiatric.   Mental Status: The patient was awake, alert, appropriate  Cranial Nerves: CN II-XII   Motor:   Reflex:   Sensation:   Coordination:   Gait:       Imaging     Reports:  MRI Cervical Spine W WO Contrast    Result Date: 02/26/2023  Impression:  1. Within the confines of motion artifact, no definite focal intramedullary signal abnormality and enhancement within the cervical spinal cord. 2. Degenerative changes appear similar in extent to prior examination. Electronically signed by: Susy Frizzle M.D. [facility] DF: 02/26/23    MRI Thoracic Spine W WO Contrast    Result Date: 02/26/2023  Impression:  1. Motion degraded examination. There is suspicion of T2 hyperintense signal abnormality within primarily the mid-dorsal spinal cord at the thoracic levels as described above. No definite associated enhancement. Findings are nonspecific but could represent sequela of a chronic demyelinating/inflammatory process, or possibly a metabolic abnormality including B12 deficiency. 2. Stable mild degenerative changes. Electronically signed by: Susy Frizzle M.D.  [facility] DF: 02/26/23    MRI BRAIN WO CONTRAST    Result Date: 01/13/2023  Impression:   IMPRESSION: 1. No acute abnormality. 2. Moderate chronic microvascular ischemic disease. Electronically Signed   By: Feliberto Harts M.D.   On: 01/13/2023 15:34    CT HEAD WO CONTRAST    Result Date: 01/12/2023  Impression:  IMPRESSION: CT head: 1.  No evidence of an acute intracranial abnormality. 2. Moderate chronic small vessel ischemic changes within the cerebral white matter. 3. Mild cerebellar atrophy. CT cervical spine: 1.  No evidence of an acute intracranial abnormality. 2. Nonspecific straightening of the expected cervical lordosis. 3. Slight grade 1 retrolisthesis at C5-C6. 4. Cervical spondylosis. Electronically Signed   By: Jackey Loge D.O.   On: 01/12/2023 12:25       Testing       Labs  Lab Results   Component Value Date    VITB6 3.6 05/30/2023    HGBA1C 6.0 (H) 05/30/2023    EGFR 57 (L) 05/30/2023    CREAT 1.08 (H) 05/30/2023    WBC 6.1 08/08/2023    LYMPHOABS 2.5 08/08/2023    IGG 984 08/10/2023    IGA 477 (H) 05/30/2023    IGM 52 05/30/2023    B12 452 05/30/2023    VITD 13.6 (L) 05/30/2023     CSF r4, w0, igg index 0.39,  3 mirror image CSF bands, glc 66, prot 34, enceph panel neg, JCV DNA neg,     ASSESSMENT / PLAN  RTC: Return in about 6 weeks (around 10/31/2023) for [place].  1. 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 Autoimmune disorder of spinal cord        67 y.o. F with autoimmune myelitis (dorsal column myelopathy) as demonstrated by oligoclonal bands in CSF. She does not have stiff person syndrome. She did not tolerate a single dose of IVIG and had poor response to PLEX.    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     PLAN  - start myfortic and titrate up as below. If not covered, then can use mycophenolate 500mg  tabs with same regimen. Expected benefits, risks, toxicity, and side effects were discussed with patient.    - vit b12 daily  - vit d3 50k/week,  start    Orders today (details below)  No orders of the defined types were placed in this encounter.    Orders Placed This Encounter   Medications    mycophenolate (MYFORTIC) 360 MG Tablet Delayed Response     Sig: Take 1 tablet (360 mg) by mouth nightly for 7 days, THEN 1 tablet (360 mg) 2 (two) times daily for 7 days, THEN 2 tablets (720 mg) 2 (two) times daily for 7 days, THEN 3 tablets (1,080 mg) 2 (two) times daily.     Dispense:  540 tablet     Refill:  0       I spent 40 minutes reviewing the records, talking with the patient and developing their care plan. This excludes time for separately billed procedures.      Lora Havens, MD PHD  Director, Neuroimmunology & MS Center  [facility]  [address], [place], [place] [address]    Telephone [phone]  Fax [phone]   The fastest response is via [place]  ===================================================================  NFL (neurofilament light chain) Insurance Justification  Serum / plasma Neurofilament light chain is a well-established biomarker for monitoring MS, and regular testing has been incorporated into the Consortium of MS Centers [facility]) guidelines for the monitoring of MS. https://mscare.http://www.stevens.com/     PATIENT INSTRUCTIONS PROVIDED  Patient was given an "After Visit Summary" with a copy of the testing orders, medications and the following other instructions:  Patient Instructions   [facility]  MULTIPLE SCLEROSIS & NEUROIMMUNOLOGY CENTER  Telephone: [phone]  Fax: [phone]  Send [place] a mychart or inbasket message for the fastest response  ~~~~~~~~~~~~~~~~~~~~~~~~~~~~~~~~~~~~~~~~~~~~~~~~~~~~~~~~~~~~~~~~~~    WELCOME TO THE NEUROIMMUNOLOGY CLINIC!    MULTIPLE SCLEROSIS AND NEUROIMMUNE DISEASE UPDATE  (Updated 08/10/2023)    1) Clinic Procedures  We only do in-person visits   For over 2000 years,  medicine has been a face to face, personal enterprise -- and the best medicine happens face to face. Although telemedicine is very convenient (and was necessary during the pandemic), I've seen poor outcomes with telemedicine care. In-person evaluations are critical part of you doing well. As such we do not offer video visits.     Regular, ongoing, follow up visits is also important. Each person is different, but we see the average patient every 3-6 months. We may not refill controlled substances if you haven't been seen within 6 months, and we may not refill other medications if you haven't been seen within 12 months.    Reaching [place]  - Mychart is best. We will respond within 2-3 business days. (We  do not use regular email as policy)  - My nurses' direct phone [phone]. The main neurology line is [phone]. Leave a message and we will get back to you in a few days.  - After hours, the main line [phone]) will page the on-call neurologist.    You will probably hear back from my nurses. I communicate with them throughout the day. The advice they give you is coming from me - please take it seriously.    Emergency / After Hours Care.   If you have questions after hours, call the main number [phone]) and you will reach the on-call neurologist who can help you.    If you have severe, acute or debilitating symptoms, go to the emergency room. But also let us know via mychart or a phone call. I can best help you if you are at an [facility] hospital. You might not have a choice where the ambulance takes you; if you are not at an [facility] hospital, I'll try to help you the best I can. But I probably can't see everything that's going on.    Urgent appointments are available within a few days. My nurses have access to more appointment slots than the call center does.     Testing. Details matter.  The tests I order are specific and high-quality MS care requires advanced technology    - MRI's: we prefer to work with  [facility] radiology or [facility]. Some of our patients are enrolled in studies at [facility], and that works as well. If you have had outside MRI's done previously, please bring the CD's so that we can upload them.    - LABS: Go to the lab indicated on the top of your lab order. Do not take an [place] lab slip to [facility]- they will do it incorrectly. Do not go to [facility] or your primary care doctor. Most patients are sent to [facility] lab.    - Results of testing will be communicated once they all return at follow-up visits. Many of the tests are nuanced and must be interpreted within the overall clinical picture. I do not call for results.      2) Testing Phone Numbers  MRI & CT:  [facility] at [phone]  [facility] Fax: [phone]  [facility] at [phone]  For insurance approval, reach out to them first. If there is a denial, let us know ASAP as we can help.     LP / Spinal Tap: Call [phone] for Specialized Imaging Scheduling. Backup is [facility] at [phone]    Labs: Any [place] location. [email or web address]    3) Doing well with Neuroimmune Disease  Symptoms don't tell you how the disease is doing.  You can experience more symptoms even if the disease isn't worsening. Fluctuating symptoms are expected. Environmental factors like infections, extremes of heat or cold, stress (emotional or physiologic), fatigue, infections, other medical diseases, and certain foods may make you feel more symptoms. That's not the disease worsening, it's not more neurologic damage, and it's not a relapse. Those are worsening symptoms. Most neuroimmune patients have good days & bad days. You might need symptomatic medications to help you function well.    Also, some diseases worsen without producing more symptoms.     In general, if you have a question about your symptoms or notice any new symptoms, please reach out.      Monitoring the Disease and Medication Response:  MS and neuroimmune  diseases  are caused by your body's immune system attacking the nervous system (brain, spinal cord, nerves or muscles). We use immune treatments to prevent more damage. We can measure whether those treatments are working through objective testing - labs, MRI's, EMGs, etc.    For MS, NMO or MOG, we know that everyone develop more lesions, neurologic problems on exam and symptoms unless they are treated (Dr Loura Back in [place] proved this). MRI is one of the best ways of detecting more damage - it shows up as a new lesion. The clinical exam and labs can also help [place]. In MS and MOG, a relapse without symptoms is very common, so we do frequent labs and yearly MRIs to monitor the disease. This is very important to detect subclinical progression of disease.     For other neuroimmune diseases -- eg,  myasthenia, CIDP/GBS, or rheumatologic diseases -- not everyone requires treatment. But we use labs, exam, MRI's and/or EMG to monitor the diseases    Medications are the start, but they aren't everything. They won't repair damage that already happened, and they may not help symptoms. It is to prevent more damage.     Physical and Cognitive Activity Promote Neurologic Recovery: In order to improve the disease, the brain has to rewire around the damaged nerves. The best way to do this is by being active - physically and mentally. Physical, occupational or cognitive therapy can teach you exercises to address specific problems. Physical activity has been shown to improve energy, mood, and reduce pain. Be as physically and mentally active as you can; learn to pace yourself.    Diet: There is no such thing as an "MS diet" or "anti-inflammatory diet". Foods don't affect MRI lesions or prevent neurologic damage, but a good or bad diet can markedly impact symptoms. My suggestion is to eat healthy - we all know what that is! Diets like the Wahl's protocol or Swank diet have ideas worth trying, but don't feel obligated to follow them  precisely. See if adding fiber, reducing gluten, reducing dairy or eliminating processed foods makes a difference in how you feel.    Vitamins and Supplements  Vitamins can affect neurologic functioning:  - Vitaimn D and MS. High vit D levels correlate with better outcomes in MS patients. Target a total (25-OH) Vit D level between 50-100. Vitamin D3 (CHOLEcalciferol; white pill) works better than Vitamin D2 (ERGOcalciferol; green pill). You can get high doses of vitamin D3 from [facility] (eg, 10,000 or 50,000 units).     - Target vit B12 level above 500. Levels below 400 can cause nerve & spinal cord degeneration. Levels above the normal range are perfectly safe.    - Vitamin B6 (pyridoxine) and zinc toxicity cause neuropathy. Do not take B-complex or zinc. Other sources include multi-vitamins, some energy drinks, and other supplements. Check the back of the bottle, not just the front. Allena Katz CD. "Pyridoxine toxicity courtesy of your local health food store" Ann Rheum Dis. 2006 Dec;65(12):1666-7]    - marked copper deficiency also causes nerve & spinal cord degeneration    - the data around other supplements like biotin, alpha-lipoic acid, etc is unclear, so I generally don't recommend them    Support Groups  MS Group - [place] (via Zoom) Astrid Divine, for MS, NMO, MOG. Her cell is [phone].  MS Group - Benna Dunks [phone]    National MS Society (NMSS): MalpracticeBoard.com.au  Myasthenia Gravis Foundation of [place] (MGFA): MoralGame.si  [employer]  for NMO: https://www.sumairafoundation.org/  [employer] for NMO: https://guthyjacksonfoundation.org/  Foundation for Sarcoid Research  DiscFull.com.cy    3) Neurologic Disease, Immune Suppressants and Infections  - MS, NMO, MOG, and GBS/CIDP themselves do not increase your risk of infection (including COVID). Severe myasthenia might increase  your risk for lung infections    - Call us if you develop a fever more than 101 F, have a productive cough longer than 2 weeks, or if you have recurring or difficult-to-treat infections. Immune suppressants can increase infection risk. You might need antibiotics, or stronger antibiotics than other people your age.    - None of the MS medications increase your COVID risk above that of an untreated patient (COVIMS study)    - Infections can cause more symptoms. That's usually not disease worsening or neurologic damage from infection. That is symptom worsening, similar to what happens with stress or the heat. You will get better a few days after the infection improves.    - Rarely, COVID can cause neurologic complications. Early on, with severe COVID, we saw a lot of strokes and seizures. We see less of that now. But, we are seeing the emergence of a post-COVID neurologic syndrome that looks like Guillain-Barre syndrome or transverse myelitis. The way to test for this is with a spinal tap (lumbar puncture). Neuro-COVID is treatable.    4) Vaccines  Safe Vaccines, regardless of immunosuppressant  - Pfizer, [place], Novavax and Booster COVID vaccines are safe in MS and with all immunosuppressants. (There were issues with J&J and Astra-Zeneca).    - Flu vaccine. Use Flublok brand only (recombinant hemagluttin quadrivalent). This is per FDA guidance for all neurologic patients. It can be difficult to find and we can give you an RX for it.     - Shingles vaccine. Use Shingrix (RZV, recombinant zoster) vaccine. This is per FDA guidance for all neurologic patients.     - All pneumonia vaccines are safe (Prevnar & Capvaxive )    - Tetanus toxoid and TDAP are ok (TDAP is preferred over DTAP).     Unsafe Vaccines (for any neuroimmune patients)  - Live virus or inactivated virus vaccines are contraindicated in neuro-immunologic patients on any immunotherapy.    - monkeypox vaccines (JYNNEOS and ACAM2000) are contraindicated in MS  and patients on immunosupressants.     - RSV vaccines are associated with cases of Guillian Barre syndrome, so I would avoid them.     - J&J and Astra Zeneca COVID vaccine were associated with transverse myelitis    Notes:  - [place]: The contraindication and timing requirements do not apply to most vaccines. They are only for live virus vaccines, which are contraindicated anwyays    - You don't need to time your vaccinations with infusion dates. There have been theoretical concerns about immunosuppression affecting vaccine effectiveness, but recent research has shown this not to be true.    - MS is compatible with pregnancy but we need to know if you plan on getting pregnant or become pregnant as we may need to adjust your treatment. Certain disease modifying treatments for MS are better than others for those seeking to become pregnant.       Thank you,      Lora Havens, MD PHD  Director, Neuroimmunology & MS Center  [facility]  [address], [address], [place], [place] [address]    Telephone [phone]  Fax [phone]  The fastest response is via [place]    IMPORTED INFORMATION FROM MEDICAL RECORDS   Diagnosis ICD-10-CM Associated Order   1. 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 Autoimmune disorder of spinal cord  G95.89         No orders of the defined types were placed in this encounter.

## 2023-09-19 NOTE — Patient Instructions (Signed)
[facility]  MULTIPLE SCLEROSIS & NEUROIMMUNOLOGY CENTER  Telephone: [phone]  Fax: [phone]  Send [place] a mychart or inbasket message for the fastest response  ~~~~~~~~~~~~~~~~~~~~~~~~~~~~~~~~~~~~~~~~~~~~~~~~~~~~~~~~~~~~~~~~~~    WELCOME TO THE NEUROIMMUNOLOGY CLINIC!    MULTIPLE SCLEROSIS AND NEUROIMMUNE DISEASE UPDATE  (Updated 08/10/2023)    1) Clinic Procedures  We only do in-person visits   For over 2000 years, medicine has been a face to face, personal enterprise -- and the best medicine happens face to face. Although telemedicine is very convenient (and was necessary during the pandemic), I've seen poor outcomes with telemedicine care. In-person evaluations are critical part of you doing well. As such we do not offer video visits.     Regular, ongoing, follow up visits is also important. Each person is different, but we see the average patient every 3-6 months. We may not refill controlled substances if you haven't been seen within 6 months, and we may not refill other medications if you haven't been seen within 12 months.    Reaching [place]  - Mychart is best. We will respond within 2-3 business days. (We do not use regular email as policy)  - My nurses' direct phone [phone]. The main neurology line is [phone]. Leave a message and we will get back to you in a few days.  - After hours, the main line [phone]) will page the on-call neurologist.    You will probably hear back from my nurses. I communicate with them throughout the day. The advice they give you is coming from me - please take it seriously.    Emergency / After Hours Care.   If you have questions after hours, call the main number [phone]) and you will reach the on-call neurologist who can help you.    If you have severe, acute or debilitating symptoms, go to the emergency room. But also let us know via mychart or a phone call. I can best help you if you are at an [facility] hospital. You might not have a choice where  the ambulance takes you; if you are not at an [facility], I'll try to help you the best I can. But I probably can't see everything that's going on.    Urgent appointments are available within a few days. My nurses have access to more appointment slots than the call center does.     Testing. Details matter.  The tests I order are specific and high-quality MS care requires advanced technology    - MRI's: we prefer to work with [facility] radiology or [place] [facility] imaging. Some of our patients are enrolled in studies at [facility], and that works as well. If you have had outside MRI's done previously, please bring the CD's so that we can upload them.    - LABS: Go to the lab indicated on the top of your lab order. Do not take an [place] lab slip to [facility]- they will do it incorrectly. Do not go to [facility] or your primary care doctor. Most patients are sent to [facility] lab.    - Results of testing will be communicated once they all return at follow-up visits. Many of the tests are nuanced and must be interpreted within the overall clinical picture. I do not call for results.      2) Testing Phone Numbers  MRI & CT:  [facility] at [phone]  [facility] Fax: [phone]  [facility] at [phone]  For insurance approval, reach out to them first.  If there is a denial, let us know ASAP as we can help.     LP / Spinal Tap: Call [phone] for Specialized Imaging Scheduling. Backup is [facility] at [phone]    Labs: Any [facility] location. [email or web address]    3) Doing well with Neuroimmune Disease  Symptoms don't tell you how the disease is doing.  You can experience more symptoms even if the disease isn't worsening. Fluctuating symptoms are expected. Environmental factors like infections, extremes of heat or cold, stress (emotional or physiologic), fatigue, infections, other medical diseases, and certain foods may make you feel more symptoms. That's not the disease worsening, it's not more  neurologic damage, and it's not a relapse. Those are worsening symptoms. Most neuroimmune patients have good days & bad days. You might need symptomatic medications to help you function well.    Also, some diseases worsen without producing more symptoms.     In general, if you have a question about your symptoms or notice any new symptoms, please reach out.      Monitoring the Disease and Medication Response:  MS and neuroimmune diseases are caused by your body's immune system attacking the nervous system (brain, spinal cord, nerves or muscles). We use immune treatments to prevent more damage. We can measure whether those treatments are working through objective testing - labs, MRI's, EMGs, etc.    For MS, NMO or MOG, we know that everyone develop more lesions, neurologic problems on exam and symptoms unless they are treated (Dr Loura Back in [place] proved this). MRI is one of the best ways of detecting more damage - it shows up as a new lesion. The clinical exam and labs can also help [place]. In MS and MOG, a relapse without symptoms is very common, so we do frequent labs and yearly MRIs to monitor the disease. This is very important to detect subclinical progression of disease.     For other neuroimmune diseases -- eg,  myasthenia, CIDP/GBS, or rheumatologic diseases -- not everyone requires treatment. But we use labs, exam, MRI's and/or EMG to monitor the diseases    Medications are the start, but they aren't everything. They won't repair damage that already happened, and they may not help symptoms. It is to prevent more damage.     Physical and Cognitive Activity Promote Neurologic Recovery: In order to improve the disease, the brain has to rewire around the damaged nerves. The best way to do this is by being active - physically and mentally. Physical, occupational or cognitive therapy can teach you exercises to address specific problems. Physical activity has been shown to improve energy, mood, and reduce pain.  Be as physically and mentally active as you can; learn to pace yourself.    Diet: There is no such thing as an "MS diet" or "anti-inflammatory diet". Foods don't affect MRI lesions or prevent neurologic damage, but a good or bad diet can markedly impact symptoms. My suggestion is to eat healthy - we all know what that is! Diets like the Wahl's protocol or Swank diet have ideas worth trying, but don't feel obligated to follow them precisely. See if adding fiber, reducing gluten, reducing dairy or eliminating processed foods makes a difference in how you feel.    Vitamins and Supplements  Vitamins can affect neurologic functioning:  - Vitaimn D and MS. High vit D levels correlate with better outcomes in MS patients. Target a total (25-OH) Vit D level between 50-100. Vitamin D3 (CHOLEcalciferol; white pill) works  better than Vitamin D2 (ERGOcalciferol; green pill). You can get high doses of vitamin D3 from [facility] (eg, 10,000 or 50,000 units).     - Target vit B12 level above 500. Levels below 400 can cause nerve & spinal cord degeneration. Levels above the normal range are perfectly safe.    - Vitamin B6 (pyridoxine) and zinc toxicity cause neuropathy. Do not take B-complex or zinc. Other sources include multi-vitamins, some energy drinks, and other supplements. Check the back of the bottle, not just the front. Allena Katz CD. "Pyridoxine toxicity courtesy of your local health food store" Ann Rheum Dis. 2006 Dec;65(12):1666-7]    - marked copper deficiency also causes nerve & spinal cord degeneration    - the data around other supplements like biotin, alpha-lipoic acid, etc is unclear, so I generally don't recommend them    Support Groups  MS Group - [place] (via Zoom) Astrid Divine, for MS, NMO, MOG. Her cell is [phone].  MS Group - Benna Dunks [phone]    National MS Society [facility]): [email or web address].[email or web address]  Myasthenia Gravis Foundation of [place] (MGFA):  MoralGame.si  [employer] for NMO: https://www.sumairafoundation.org/  [employer] for NMO: https://guthyjacksonfoundation.org/  Foundation for Sarcoid Research  DiscFull.com.cy    3) Neurologic Disease, Immune Suppressants and Infections  - MS, NMO, MOG, and GBS/CIDP themselves do not increase your risk of infection (including COVID). Severe myasthenia might increase your risk for lung infections    - Call us if you develop a fever more than 101 F, have a productive cough longer than 2 weeks, or if you have recurring or difficult-to-treat infections. Immune suppressants can increase infection risk. You might need antibiotics, or stronger antibiotics than other people your age.    - None of the MS medications increase your COVID risk above that of an untreated patient (COVIMS study)    - Infections can cause more symptoms. That's usually not disease worsening or neurologic damage from infection. That is symptom worsening, similar to what happens with stress or the heat. You will get better a few days after the infection improves.    - Rarely, COVID can cause neurologic complications. Early on, with severe COVID, we saw a lot of strokes and seizures. We see less of that now. But, we are seeing the emergence of a post-COVID neurologic syndrome that looks like Guillain-Barre syndrome or transverse myelitis. The way to test for this is with a spinal tap (lumbar puncture). Neuro-COVID is treatable.    4) Vaccines  Safe Vaccines, regardless of immunosuppressant  - Pfizer, [place], Novavax and Booster COVID vaccines are safe in MS and with all immunosuppressants. (There were issues with J&J and Astra-Zeneca).    - Flu vaccine. Use Flublok brand only (recombinant hemagluttin quadrivalent). This is per FDA guidance for all neurologic patients. It can be difficult to find and we can give you an RX for it.     - Shingles vaccine. Use Shingrix (RZV,  recombinant zoster) vaccine. This is per FDA guidance for all neurologic patients.     - All pneumonia vaccines are safe (Prevnar & Capvaxive )    - Tetanus toxoid and TDAP are ok (TDAP is preferred over DTAP).     Unsafe Vaccines (for any neuroimmune patients)  - Live virus or inactivated virus vaccines are contraindicated in neuro-immunologic patients on any immunotherapy.    - monkeypox vaccines (JYNNEOS and ACAM2000) are contraindicated in MS and patients on immunosupressants.     - RSV vaccines are associated  with cases of Guillian Barre syndrome, so I would avoid them.     - J&J and Astra Zeneca COVID vaccine were associated with transverse myelitis    Notes:  - [employer]: The contraindication and timing requirements do not apply to most vaccines. They are only for live virus vaccines, which are contraindicated anwyays    - You don't need to time your vaccinations with infusion dates. There have been theoretical concerns about immunosuppression affecting vaccine effectiveness, but recent research has shown this not to be true.    - MS is compatible with pregnancy but we need to know if you plan on getting pregnant or become pregnant as we may need to adjust your treatment. Certain disease modifying treatments for MS are better than others for those seeking to become pregnant.       Thank you,      Lora Havens, MD PHD  Director, Neuroimmunology & MS Center  [facility]  [address], [address], [place], [place] [address]    Telephone [phone]  Fax [phone]   The fastest response is via [place]

## 2023-09-20 ENCOUNTER — Telehealth: Payer: Self-pay

## 2023-09-20 NOTE — Telephone Encounter (Signed)
[name]   Key: Francisca December  PA Case ID #: [id number]  Sent to Plan today  Myfortic 360MG  dr tablets

## 2023-09-26 NOTE — Telephone Encounter (Signed)
[name]   Key: [name] December  PA Case ID #: [id number]  Myfortic 360MG  dr tablets    Approved on November 28 by [facility] 2017  PA Case: [id number], Status: Approved, Coverage Starts on: 07/25/2023 12:00:00 AM, Coverage Ends on: 09/20/2024 12:00:00 AM.  Authorization Expiration Date: 09/19/2024

## 2023-10-31 ENCOUNTER — Ambulatory Visit
Admission: RE | Admit: 2023-10-31 | Payer: Medicare Other | Source: Ambulatory Visit | Admitting: Rehabilitative and Restorative Service Providers"

## 2023-11-02 NOTE — Progress Notes (Signed)
 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) - Plan: TYPE-IN DME, VENIPUNCTURE, DISCONTINUED: TYPE-IN DME    (E11.9) Diet-controlled diabetes mellitus (HCC) - Plan: Protein / Creatinine, Urine Ratio, Hemoglobin A1c with Est Avg Glucose, CBC with Differential, CBC with Differential, Hemoglobin A1c with Est Avg Glucose, Protein / Creatinine, Urine Ratio, VENIPUNCTURE    (E87.6) Hypokalemia - Plan: Comprehensive Metabolic Panel, Comprehensive Metabolic Panel, VENIPUNCTURE    (G62.9) Neuropathy - Plan: Protein Electro w/ Interp, CBC with Differential, CBC with Differential, Protein Electro w/ Interp, VENIPUNCTURE    (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  Cont f/u wtih Psychiatry    (I48.0) Paroxysmal atrial fibrillation (HCC) - f/u with cardiology    (R56.9) Seizure (HCC) - f/u with Neurology    (R26.9) Gait difficulty - Plan: TYPE-IN DME, VENIPUNCTURE, DISCONTINUED: TYPE-IN DME    (Z12.31) Encounter for screening mammogram for malignant neoplasm of breast - Plan: MAMMO-DIGITAL SCREENING BILAT W/ TOMOSYNTHESIS, VENIPUNCTURE        DOCUMENTATION:    MEDICATIONS AND ALLERGIES WERE REVIEWED AND UPDATED IN EPIC  THE HEALTH MAINTENANCE RECORD WAS REVIEWED AND UPDATED IN EPIC  THE SOCIAL AND FAMILY HISTORY WAS REVIEWED AND UPDATED IN EPIC  THE PATIENTS PROBLEM LIST, PAST MEDICAL AND SURGICAL HISTORY WERE REVIEWED AND UPDATED.  SMOKING HISTORY WAS REVIEWED AND UPDATED IN EPIC               Return in about 6 months (around 05/01/2024), or if symptoms worsen or fail to improve.         Chief Complaint         Patient presents with   . Follow-Up Office Visit     6 month f/u    . MEDICARE ADVANTAGE ANNUAL VISIT     Annual wellness visit       History of Present Illness        She has some dizziness.  Room starts spinning.  She does not thinks she turned her head quickly or bent over.  It has happened twice.  The dizziness lasted for seconds.    Autoimmune spinal cord disease - She recently started new med - Mycophenolate  from Neurology.  She will f/u with them again at the end of this month  Patient has had seizures in the past.  Neuro manages as well.  She is on Lamotrigine  and has been seizure free    C/O pain under the bottom of her feet  She has neuropathy - some longstanding and pre-existing, but now exacerbated by B6  toxicity from previous supplement      Appetite is fine.  She states she is well hydrated.      BIPOLAR  The patient presents for evaluation of Bipolar  She is compliant with medications with no noted side effects.  Patient feels her symptoms are the same severity over time. - stable  Interval Comments: Next psych visit 11/20/23        ATRIAL FIBRILLATION/CHF  The patient presents for evaluation of atrial fibrillation.  Anticoagulation therapy is Eliquis .  She is compliant with medications.  Patient denies abnormal bleeding, bruising, palpitations, shortness or  breath or edema.  Congestive Heart Failure  Patient presents for evaluation of congestive heart failure.   She denies, weight gain, orthopnea, or PND  Interval comments: Last cardiology appt was last month.      She sees pain management for her leg pain.      She has been taking K for the past month fter it was low in the ER back on 09/24/23              Review of Systems   Review of Systems   Constitutional: Negative.    HENT: Negative.     Eyes: Negative.    Respiratory:  Negative for chest tightness.    Cardiovascular:  Positive for leg swelling.   Endocrine: Negative.    Genitourinary: Negative.    Musculoskeletal:  Positive for arthralgias and myalgias.   Allergic/Immunologic: Negative.    Neurological:   Positive for dizziness.   Hematological: Negative.    Psychiatric/Behavioral: Negative.         Home Medications     Outpatient Medications Marked as Taking for the 11/02/23 encounter (Office Visit) with Williams, Raenell, MD   Medication Sig Dispense Refill   . Ammonium Lactate 12 % Top CREA PLEASE SEE ATTACHED FOR DETAILED DIRECTIONS     . apixaban  (ELIQUIS ) 5 mg PO TABS Take 1 Tab by Mouth Twice Daily.     . dapagliflozin propanediol (FARXIGA) 10 mg PO TABS Take 1 Tab by Mouth Once a Day. 30 Tab 0   . diazePAM  (VALIUM ) 5 mg PO TABS Take 1 Tab by Mouth 2 (two) times a day.     . gabapentin  (NEURONTIN ) 100 mg PO CAPS Take 1 Cap by Mouth 3 Times Daily.     . lamoTRIgine  (LAMICTAL ) 100 mg PO TABS Take 1 Tab by Mouth Once a Day. Indications: epilepsy     . lurasidone  (LATUDA ) 40 mg PO TABS TAKE 1 TABLET DAILY WITH FOOD (AT LEAST 350 CALORIES)     . lurasidone  120 mg PO TABS Take 1 Tab by Mouth Once a Day.     . MAGNESIUM  GLYCINATE PO Take 100 mg by Mouth Once a Day. Indications: patient states she takes this over the counter     . metoprolol  XL (TOPROL  XL) 25 mg PO TB24 Take 1 Tab by Mouth Once a Day.     . pantoprazole  (PROTONIX ) 40 mg PO TBEC TAKE 1 TABLET BY MOUTH TWICE A DAY 180 Tab 1   . potassium chloride  ER (K-DUR;KLOR-CON  M) 10 mEq PO tablet,extended release (part/cryst) Take 1 Tab by Mouth 3 times a day.     . QUEtiapine  SR (SEROQUEL  XR) 50 mg PO TB24 Take 1 Tab by Mouth Every 24 Hours. Indications: manic-depression     . rOPINIRole  (REQUIP ) 0.5 mg PO TABS TAKE 2 TABS BY MOUTH EVERY NIGHT AT BEDTIME. 180 Tab 1   . traMADoL  (ULTRAM ) 50 mg PO TABS Take 1 Tab by Mouth 2 Times Daily As Needed. for pain 60 Tab 0       Allergies     Allergies   Allergen Reactions   . Montelukast rash/itching and swelling   . Nitroglycerin hives and rash/itching   . Immune Globulin  (Human) (Igg) swelling and rash/itching     Pt complain of itchiness and swelling of lips and tongue. Saturation fine, bp elevated.   . Magnesium  Sulfate  rash/itching     Can take magnesium  supplement   . Amoxicillin hives     Has tolerated  ceftriaxone, cephalexin   . Atorvastatin  unknown and other/intolerance     Rhabdomyolysis   Pt states she thinks it caused her to have rhabdo   . Bumex [Bumetanide] muscle/joint pain   . Ceftriaxone rash/itching   . Magnesium  Chloride rash/itching   . Sulfamethoxazole-Trimethoprim rash/itching   . Iodinated Contrast Media swelling     Throat swelling and lip itching   . Penicillins hives     Has tolerated cephalexin   . Tetracyclines hives   . Latex, Natural Rubber rash/itching and hives   . Kit Prep Of Tc-26m-Tetrofosmin unknown       Past Surgical History     Past Surgical History:   Procedure Laterality Date   . BREAST BIOPSY Right 06/18/2021    right ultrasound core biopsy with clip placed   . CARDIAC CATH  2016, 02/2016   . CAROTID ENDARTERECTOMY     . COLONOSCOPY N/A 07/01/2013    Procedure: COLONOSCOPY;  Surgeon: Neysa Katz, MD;  Location: [facility] ENDO OR LOC;  Service: Endoscopy GI;  Laterality: N/A;   . COLONOSCOPY N/A 04/13/2015    Procedure: COLONOSCOPY FLEXIBLE;  Surgeon: Glendia Sherry, MD;  Location: [facility] ENDO OR LOC;  Service: Endoscopy GI;  Laterality: N/A;   . COLONOSCOPY WITH BIOPSY N/A 04/13/2015    Procedure: COLONOSCOPY FLEXIBLE WITH BIOPSY;  Surgeon: Glendia Sherry, MD;  Location: [facility] ENDO OR LOC;  Service: Endoscopy GI;  Laterality: N/A;   . COLONOSCOPY WITH BIOPSY N/A 01/31/2017    Procedure: COLONOSCOPY FLEXIBLE WITH BIOPSY;  Surgeon: Vicci Emery DEL, MD   . COLONOSCOPY WITH BIOPSY N/A 05/29/2017    Procedure: COLONOSCOPY, WITH BIOPSY;  Surgeon: Josepha Marinell BROCKS, MD   . COLONOSCOPY WITH BIOPSY N/A 05/29/2017    Procedure: COLONOSCOPY, WITH BIOPSY;  Surgeon: Josepha Marinell BROCKS, MD   . EGD N/A 07/01/2013    Procedure: ESOPHAGOGASTRODUODENOSCOPY;  Surgeon: Neysa Katz, MD;  Location: [facility] ENDO OR LOC;  Service: Endoscopy GI;  Laterality: N/A;   . EGD N/A 08/08/2014    Procedure:  ESOPHAGOGASTRODUODENOSCOPY FLEXIBLE TRANSORAL DIAGNOSTIC;  Surgeon: Glendia Copier, MD;  Location: [facility] ENDO OR LOC;  Service: Endoscopy GI;  Laterality: N/A;   . EGD WITH BIOPSY N/A 06/07/2016    Procedure: ESOPHAGOGASTRODUODENOSCOPY FLEXIBLE TRANSORAL WITH BIOPSY;  Surgeon: Vicci Emery DEL, MD   . EGD WITH BIOPSY N/A 03/20/2020    Procedure: EGD, WITH BIOPSY;  Surgeon: Josepha Marinell BROCKS, MD   . EGD WITH BLEEDING CONTROL N/A 05/29/2017    Procedure: EGD, WITH HEMORRHAGE CONTROL;  Surgeon: Josepha Marinell BROCKS, MD   . EXCISION OF BENIGN LESION Right 10/19/2021    Procedure: EXCISION, LESION, BENIGN, BREAST;  Surgeon: Clance Rosealee HERO, MD   . HEART CATHETERIZATION     . HYSTERECTOMY     . LOOP ELECTROSURGICAL EXCISON PROCEDURE     . LOOP RECORDER  2018   . OTHER Right 06/18/2021    axillary lymph node biopsy with clip placed   . OTHER  05/2021    Left leg ablation procedure   . VENOUS ACCESS CATHETER INSERTION Right 03/29/2019        Past Medical History     Past Medical History:   Diagnosis Date   . Acute, but ill-defined, cerebrovascular disease     TIA   . Arthropathy     knees   . Asthma    . Bipolar disorder (HCC)    . Cardiac arrhythmia     h/o Afib on Eliquis    . Cerebral  artery occlusion with cerebral infarction [facility])     CVA post endodartorectomy   . Chest pain     has history multiple admissions for this, stress test and cardiac cath negative   . Chronic anemia     Hgb ~10   . COPD (chronic obstructive pulmonary disease) (HCC)    . Cough    . COVID    . COVID-19 vaccine series completed    . CVA (cerebral vascular accident) (HCC) 09/01/2021   . Deficiency anemia 11/2020    rx with Iron Infusions   . Epilepsy (HCC)     per pt last Sz was approx 9-10 months ago   . Esophageal reflux    . Headache    . 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 Hemorrhoids    . History of blood transfusion     following one of her pregnancy   . History of cardiac  catheterization no obstructive cad in 2017    . Hyperlipidemia    . Hyperlipidemia    . Hypertension    . Lower leg edema    . Migraines    . Paroxysmal atrial fibrillation (HCC)     on anticoagulation   . Seizure-like activity (HCC)     EEG negative   . Stiff person syndrome    . Syncope     recurrent, has loop recorder   . Thoracic aortic aneurysm (HCC)    . TIA (transient ischemic attack)     multiple since age 51's per patient. Multiple MRI brain in the past negative for evidence of stroke. Suspected complicated migraine.   . Type 2 diabetes mellitus (HCC)     not on medication   . Unable to coordinate sucking, swallowing, and breathing 09/28/2019       Family History     Family History   Problem Relation Age of Onset   . Hypertension Mother    . Diabetes Mother    . Hypertension Sister    . Diabetes Brother    . Hypertension Brother    . Other Family History Father         Unknown to patient   . Anesthesia Reaction Neg Hx        Social History     Social History     Occupational History   . Not on file   Tobacco Use   . Smoking status: Never     Passive exposure: Never   . Smokeless tobacco: Never   Vaping Use   . Vaping status: Never Used   Substance and Sexual Activity   . Alcohol use: No     Alcohol/week: 0.0 standard drinks of alcohol   . Drug use: No   . Sexual activity: Not Currently       E-Cigarette Use: Never User   Start Date:    Quit Date:    Passive Exposure:    Counseling Given:    Comments:    Nicotine:    THC:    CBD:     Flavoring:    Other:          The patient's medical, family, and social history (including tobacco usage) were reviewed and updated as appropriate.    Tobacco History reviewed:  Social History     Tobacco Use   Smoking Status Never   . Passive exposure: Never   Smokeless Tobacco Never      Counseling given: Not  Answered      Physical Exam   Vital Signs: BP 114/73   Pulse 66   Ht 5' 5 (1.651 m)   Wt 93.4 kg (206 lb)   LMP  (LMP Unknown)   SpO2 98%   BMI 34.28 kg/m       Physical Exam  Constitutional:       Appearance: She is obese.   HENT:      Head: Normocephalic and atraumatic.   Eyes:      Extraocular Movements: Extraocular movements intact.      Conjunctiva/sclera: Conjunctivae normal.   Cardiovascular:      Rate and Rhythm: Normal rate and regular rhythm.      Heart sounds: Normal heart sounds.   Pulmonary:      Effort: Pulmonary effort is normal.      Breath sounds: Normal breath sounds.   Musculoskeletal:         General: Normal range of motion.   Neurological:      General: No focal deficit present.      Mental Status: She is alert and oriented to person, place, and time.   Psychiatric:         Mood and Affect: Mood normal.         Behavior: Behavior normal.         Thought Content: Thought content normal.         Judgment: Judgment normal.              Recent Results & Studies   Lab results are pending.           I have reviewed information entered by the clinical staff and/or patient and verified it as accurate or edited where necessary.    Signature:  [name], MD  [facility]   Dept: [phone]  Dept Fax: [phone]

## 2023-11-06 ENCOUNTER — Ambulatory Visit: Payer: Medicare Other | Admitting: Neurology

## 2023-11-07 NOTE — Progress Notes (Signed)
 Medicare Wellness Exam  [name], female, medical record number [id number], DOB:Mar 09, 1956 presents today for her wellness visit.      Vital signs:  BP 114/73   Pulse 66   Ht 5' 5 (1.651 m)   Wt 93.4 kg (206 lb)   LMP  (LMP Unknown)   SpO2 98%   BMI 34.28 kg/m     No LMP recorded (lmp unknown). Patient has had a hysterectomy.  OB Status: Hysterectomy  Comments:     Patient Active Problem List   Diagnosis   . Essential hypertension   . Paroxysmal atrial fibrillation (HCC)   . Bipolar 1 disorder (HCC)   . Iron deficiency anemia due to chronic blood loss   . Hypokalemia   . Pure hypercholesterolemia   . Gastroesophageal reflux disease without esophagitis   . Chronic headache   . History of nuclear stress test in 05/2108   . History of cardiac catheterization no obstructive cad in 2017    . Transient neurologic deficit   . TIA (transient ischemic attack)   . Abnormal EKG   . Vertigo   . Slurred speech   . Headache   . Weakness of left upper extremity   . Progressive focal motor weakness   . History of cardiac cath no obstructive cad in cath in 2014 and 2017   . Hx of TIA (transient ischemic attack) and stroke 08/2016   . Right sided weakness   . Left-sided weakness   . Seizure (HCC)   . Chronic anemia   . Bleeding per rectum   . Syncope   . Iron deficiency anemia, unspecified   . Migraine   . Facial droop   . Left hand paresthesia   . Fall   . Unable to coordinate sucking, swallowing, and breathing   . Left sided numbness   . Chronic diastolic (congestive) heart failure (HCC)   . Acute focal neurological deficit   . Intractable headache   . Facial paresthesia   . Non-traumatic rhabdomyolysis   . Paresthesia   . Acute encephalopathy   . Acute on chronic anemia   . UTI (urinary tract infection)   . Suprapubic catheter dysfunction, initial encounter (HCC)   . Malfunction of indwelling urinary catheter (HCC)   . Syncope and collapse   . Neuropathy   . Neurogenic bladder   . Diastolic heart failure (HCC)   .  Vitamin B6 induced neuropathy (HCC)     Past Medical History:   Diagnosis Date   . Acute, but ill-defined, cerebrovascular disease     TIA   . Arthropathy     knees   . Asthma    . Bipolar disorder (HCC)    . Cardiac arrhythmia     h/o Afib on Eliquis    . Cerebral artery occlusion with cerebral infarction [facility])     CVA post endodartorectomy   . Chest pain     has history multiple admissions for this, stress test and cardiac cath negative   . Chronic anemia     Hgb ~10   . Chronic obstructive pulmonary disease (HCC) 01/19/2017   . COPD (chronic obstructive pulmonary disease) (HCC)    . Cough    . COVID    . COVID-19 vaccine series completed    . CVA (cerebral vascular accident) (HCC) 09/01/2021   . Deficiency anemia 11/2020    rx with Iron Infusions   . Epilepsy (HCC)     per pt last Sz was approx 9-10  months ago   . Esophageal reflux    . Headache    . 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 Hemorrhoids    . History of blood transfusion     following one of her pregnancy   . History of cardiac catheterization no obstructive cad in 2017    . Hyperlipidemia    . Hyperlipidemia    . Hypertension    . Lower leg edema    . Migraines    . Paroxysmal atrial fibrillation (HCC)     on anticoagulation   . Seizure-like activity (HCC)     EEG negative   . Stable angina pectoris (HCC) 02/15/2018   . Stiff person syndrome    . Syncope     recurrent, has loop recorder   . Thoracic aortic aneurysm (HCC)    . TIA (transient ischemic attack)     multiple since age 89's per patient. Multiple MRI brain in the past negative for evidence of stroke. Suspected complicated migraine.   . Type 2 diabetes mellitus (HCC)     not on medication   . Unable to coordinate sucking, swallowing, and breathing 09/28/2019     Immunization History   Administered Date(s) Administered   . Covid-19 Vaccine, Mrna-1273 (Moderna) 0.25ml Imm Susp 11/04/2020   . Covid-19 Vaccine, Mrna-1273  0.5ML IM (PF)(Moderna) Vac 11/30/2019, 01/01/2020   . Flulaval (Medicare Only) 07/17/2019   . INFLU VAC 0.5 mL IM INJ,UNSPECIFIED 11/14/2016, 07/17/2019, 08/04/2021   . Influenza Vaccine 08/25/2013, 11/16/2016, 11/16/2016, 08/16/2017, 07/24/2018, 07/17/2019   . Pneumo polysaccharide 0.5 mL SC/IM (Pneumovax-23) vac inj 10/28/2012, 05/17/2017   . Pneumo-20 Valent Conjugate 0.5ML IM 07/03/2023   . Prevnar (Pneum 13) Imm 07/07/2017   . Rho D Immune Globulin  Inj 05/17/2017   . Tdap 0.5 mL IM (Adacel 39yr+) vac inj 03/20/2018     Family History   Problem Relation Age of Onset   . Hypertension Mother    . Diabetes Mother    . Hypertension Sister    . Diabetes Brother    . Hypertension Brother    . Other Family History Father         Unknown to patient   . Anesthesia Reaction Neg Hx        Medications:  has a current medication list which includes the following prescription(s): ammonium lactate, apixaban , dapagliflozin propanediol, diazepam , epinephrine, gabapentin , lamotrigine , lurasidone , lurasidone , magnesium  glycinate, metoprolol  xl, pantoprazole , potassium chloride  er, promethazine , quetiapine  sr, ropinirole , torsemide, tramadol , and TYPE-IN DME.    Social History:  reports that she has never smoked. She has never been exposed to tobacco smoke. She has never used smokeless tobacco. She reports that she does not drink alcohol and does not use drugs., Counseling given: Not Answered       Basic Health Risk Assessment:    In general how would you say your health is?: Good  Do you need assistance with the phone, transportation, shopping, meal preparation, housework, taking/obtaining medications, managing your finances or other activities of daily living?: none  Do you have a caregiver or someone designated to help you at home?: None  Do you feel your home environment is safe?: Yes  Do you have significant problems with memory loss or confusion or has a family member expressed concerns about this?: No  Do you exercise for 20  minutes three or more days a week?: (!) No     Do you have difficulty with your  hearing?: No  Have you ever had significant episodes of anxiety or depression?: No  Can you afford your medications?: Yes  The Healthy Change that I would like to make is: (check all that apply): (!) Lose Weight, Start/Increase Exercising, Eat Healthier, Improve my Diabetes  Have you used an opioid containing pain medication (Percocet, Hydrocodone, Oxycontin , Morphine , Tramadol ) in the last 6 months?: (!) Yes  Is pain currently interfering with your daily activities?: Yes  Do you always take these medications as prescribed?: Yes  Have you ever borrowed someone else's pain medication?: No  Has the pain medication ever made you feel dizzy, off balance, or caused you to fall?: No  Do you ever drink alcohol when taking your pain medication?: No         Safety/Fall Risk Screening (STEADI):  Have you fallen in the past year?: (!) Yes  Have you fallen more than once in the past year?: (!) Yes  Did any of your falls result in an injury?: No  Do you take any medications that make you feel dizzy or unsteady on your feet?: No  Do you feel unsteady when standing or walking?: (!) Yes  Are you worried about falling?: (!) Yes    Cognitive Screening (if applicable):    (normal is 4 or 5)    Advance Care Planning (if applicable):  Do you have a living will/advance directive?: (!) No       Vision Examination: (required by IPPE only)  No results found.         Preventative Care Review (required):  The patient's preventative care was reviewed? Yes    Health Maintenance   Topic Date Due   . SPIROMETRY (PFT)  Never done   . RESPIRATORY SYNCTIAL VIRUS (RSV) SCDM 60+ OR PREGNANT (1 - Risk 60-74 years 1-dose series) Never done   . MAMMOGRAM/BREAST CANCER SCREENING  06/18/2022   . ANNUAL MED ADVANTAGE WELLNESS VISIT (MA)  10/25/2023   . LIPID SCREENING  01/13/2024   . A1C  05/01/2024   . DIABETIC EYE EXAM  09/01/2024   . CREATININE (KIDNEY FUNCTION TEST)   11/01/2024   . DIABETIC NEPHRO SCREENING (Urine Alb/Cr Ratio)  11/01/2024   . TD VACCINE  03/20/2028   . COLORECTAL CANCER SCREENING  03/26/2030   . OSTEOPOROSIS SCREENING  Completed   . TDAP VACCINE  Completed   . HEP-C SCREENING  Completed   . PCV20 VACCINE (SCDM) DISCUSSION  Completed   . PNEUMOCOCCAL VACCINE: 65+  Completed   . HPV  Aged Out   . PHYSICAL/EXAM  Discontinued   . PAP/ CERVICAL CANCER SCREENING  Discontinued   . SHINGLES VACCINE (Shingrix)  Discontinued   . COVID-19 VACCINE  Discontinued   . FLU VACCINE  Discontinued       I have reviewed information entered by the clinical staff and/or patient and verified it as accurate or edited where necessary.    Current list of Health Care Providers:  Patient Care Team:  Trudy Mina, MD as PCP - General (Family Practice)  Darrold Elspeth PARAS, Pharmacist (Ambulatory Pharmacist)  Read Papas, MD as Cardiologist (Cardiology)  Chan, Farn, MD as Hematologist (Hematology and Oncology)  Josepha Marinell BROCKS, MD as Gastroenterologist (Gastroenterology)  Dr. Tex as Psychiatrist (Psychiatry)  Josephus Borg, NP as Urologist (Urology)  Delores Aubry HERO, RN as RN    PPPS/Care Plan was reviewed and a copy given to the patient.

## 2023-11-07 NOTE — Progress Notes (Signed)
 Subjective  [name] is a 68 y.o. female who presents for Palliative Care Follow-up.    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 Since her last visit she was seen in the ER 1/1 a pain flair up, she continues to be followed by pain management and reports 7/10 pain to BLE. She will be starting outpatient PT tomorrow to address pain, balance, flexibility and strength. She denies recent fall and continues to ambulate with a cane when she leaves home.      Review of Systems   Constitutional:  Negative for activity change, appetite change, fatigue, fever and unexpected weight change.   Respiratory:  Negative for cough and shortness of breath.    Cardiovascular:  Positive for leg swelling. Negative for chest pain.   Gastrointestinal:  Negative for abdominal pain, constipation, diarrhea, nausea and vomiting.   Genitourinary:  Negative for difficulty urinating.   Musculoskeletal:  Positive for myalgias. Negative for back pain and joint swelling.   Skin:  Negative for rash and wound.   Neurological:  Positive for weakness. Negative for dizziness and syncope.   Psychiatric/Behavioral:  Negative for dysphoric mood and sleep disturbance. The patient is not nervous/anxious.      [place] Symptom Assessment System  Pain Score (If > or = 3, complete the Pain Assessment): 7  Tiredness Score: Not tired  Nausea Score: Not nauseated  Depression Score: Not depressed  Anxiety Score: Not anxious  Drowsiness Score: Not drowsy  Appetite Score: Best appetite  Wellbeing Score: Best feeling of wellbeing  Dyspnea Score: No shortness of breath    Patient Active Problem List    Diagnosis Date Noted   . Atrial fibrillation (HCC) 09/12/2023   . Pain, chronic 09/12/2023   . 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 HTN (hypertension) 09/12/2023   . Epilepsy (HCC) 09/12/2023   . Debility 09/12/2023   . At risk for falling 09/12/2023   . Dysphagia 09/12/2023   . COPD (chronic obstructive pulmonary disease) (HCC) 09/12/2023    . Congestive heart failure (CHF) (HCC) 09/12/2023     Past Surgical History:   Procedure Laterality Date   . CT ANGIOGRAM CHEST  09/27/2018    CT ANGIOGRAM CHEST 09/27/2018   . CT ANGIOGRAM CHEST  02/05/2017    CT ANGIOGRAM CHEST 02/05/2017   . CT ANGIOGRAM CHEST  01/16/2017    CT ANGIOGRAM CHEST 01/16/2017   . CT ANGIOGRAM CHEST  12/23/2013    CT ANGIOGRAM CHEST 12/23/2013   . MR ANGIOGRAM HEAD WO IV CONTRAST  06/16/2019    MR ANGIOGRAM HEAD WO IV CONTRAST 06/16/2019   . MR ANGIOGRAM HEAD WO IV CONTRAST  12/01/2015    MR ANGIOGRAM HEAD WO IV CONTRAST 12/01/2015   . MR ANGIOGRAM HEAD WO IV CONTRAST  05/28/2014    MR ANGIOGRAM HEAD WO IV CONTRAST 05/28/2014   . MR ANGIOGRAM NECK W AND WO IV CONTRAST  06/16/2019    MR ANGIOGRAM NECK W AND WO IV CONTRAST 06/16/2019     Current Outpatient Medications   Medication Instructions   . apixaban  (Eliquis ) 5 MG tablet Oral   . dapagliflozin (FARXIGA) 10 mg, Oral, Daily RT   . diazePAM  (Valium ) 5 MG tablet Oral   . diclofenac sodium 1 % gel Topical   . gabapentin  (Neurontin ) 100 MG capsule Oral   . hyoscyamine (Anaspaz,Levsin) 0.125 MG tablet Oral   . lamoTRIgine  (LAMICTAL ) 100 mg, Oral, Daily   . lurasidone  (LATUDA ) 120 mg, Oral, Every  evening   . Mycophenolic Acid  360 mg, Oral, Daily   . pantoprazole  (PROTONIX ) 40 mg, Oral   . potassium chloride  CR (Klor-Con ) 10 MEQ ER tablet 10 mEq, Oral   . promethazine  (Phenergan ) 25 MG tablet Oral   . QUEtiapine  (SEROQUEL ) 100 mg, Oral, Nightly   . rOPINIRole  (Requip ) 0.5 MG tablet Oral   . torsemide (Demadex) 20 MG tablet Oral   . traMADol  (ULTRAM ) 50 mg, Oral     Social Determinants of Health with Concerns     Physical Activity: Insufficiently Active (07/28/2023)    Received from [facility]    Exercise Vital Sign    . Days of Exercise per Week: 2 days    . Minutes of Exercise per Session: 10 min   Stress: Stress Concern Present (07/28/2023)    Received from [facility] - Occupational Stress Questionnaire    .  Feeling of Stress : To some extent             Objective  Blood pressure 140/88, pulse 82, temperature 36.5 C (97.7 F), temperature source Temporal, height 1.651 m (5' 5), weight 200 lb (90.7 kg), SpO2 98%.  Oxygen Therapy: None (Room air)  Physical Exam  Constitutional:       General: She is not in acute distress.     Appearance: Normal appearance. She is normal weight. She is not ill-appearing or diaphoretic.   HENT:      Head: Normocephalic and atraumatic.      Mouth/Throat:      Mouth: Mucous membranes are moist.   Eyes:      Conjunctiva/sclera: Conjunctivae normal.   Cardiovascular:      Rate and Rhythm: Normal rate and regular rhythm.      Pulses: Normal pulses.      Heart sounds: Normal heart sounds.   Pulmonary:      Effort: Pulmonary effort is normal. No respiratory distress.      Breath sounds: Normal breath sounds.   Abdominal:      General: Abdomen is flat. Bowel sounds are normal.      Palpations: Abdomen is soft.   Musculoskeletal:      Cervical back: Neck supple.      Right lower leg: No edema.      Left lower leg: No edema.   Skin:     General: Skin is warm and dry.   Neurological:      General: No focal deficit present.      Mental Status: She is alert and oriented to person, place, and time. Mental status is at baseline.   Psychiatric:         Mood and Affect: Mood normal.         Behavior: Behavior normal.         Thought Content: Thought content normal.         Judgment: Judgment normal.         PPS Score: 70%  Morse Fall Risk Score: 30  ADL Screening  Dressing: Independent  Feeding: Independent  Bathing: Independent  Toileting: Independent  In/Out Bed: Independent  Walks in Home: Independent    Advance Care Planning  Advance Directive on file?: <no information>  Advance Directive Reviewed? Yes  ACP Discussion  What are your most important goals if your health situation worsens?: being comfortable, living as long as possible, being independent  Comments: Daughter is health care advocate.  Patient prefers all life prolonging interventions,  reduced symptoms burden, to maintain as much independence as possible.    GOC conversation initiated, POST form reviewed. Patient would like to discuss with daughter.  06/28/2023 Confirmed existing GOC and discussed ACP completion  10/3- Patient wants all medical interventions if needed. Her daughter is aware.             Assessment/Plan  1. Debility (Primary)  Overview:  2/2 Stiff Person Syndrome. Neurology Dr. Gerarda Kays; Notes:    Assessment & Plan:    Weakness (decreased grip strength): Yes  Slow Walking Speed (>6 to 7 seconds to walk 15 feet): No  Decreased Physical Activity: No  Weight Loss (> or = 5% in the last year): No       Plan of Care:  Discussed how and when pain medications are to be used to improve understanding of pain management. and Discussed side effects of pain medications and interventions to manage. Educated on non-pharmacological pain relief options. Continue follow up with pain management. Neurology, and PT as directed. Educated on falls precautions.    Patient Education:  Please call [facility], Palliative Care if you are experiencing any of the following: increasing pain, intolerance to medications, decreased mobility, worsening generalized weakness, or other concerns.   Other orders  -     Follow Up In Palliative Medicine; Future      Palliative Care Follow-up Expected on 12/09/2023     Moon, Kristin, NP

## 2023-11-08 ENCOUNTER — Ambulatory Visit
Admission: RE | Admit: 2023-11-08 | Discharge: 2023-11-08 | Disposition: A | Discharge status: Home / Self Care | Payer: Medicare Other | Source: Ambulatory Visit | Attending: Neurology | Admitting: Neurology

## 2023-11-08 NOTE — PT Eval Note (Incomplete)
 [facility]  Outpatient Neuro Rehabilitation Department  [address] [place] [address]  Phone: [phone]    Fax: [phone]         Physician/Non-physician Practitioner Certification    I have read and agreed with the plan of care for [name] who is under my care.         Physician signature:  ___________________________________________    Physician printed name:  ________________________________________    NPI: _________________    Date: ________________            Physical Therapy - General Evaluation      [name] DOB: 03/29/1956   MRN: [id number]  AGE: 68 y.o.    Primary Language: {Misc; languages:60526}  .      Referring Provider:  Deatrice Sammi DEL, MD PhD   Concurrent Services:  {MV OP CONCURRENT DZMCPRZ:43446}   CERTIFICATION DATE:   to     Medical Diagnosis:     Therapy Diagnosis: No diagnosis found.     Date of Service:     Start Time:     Stop Time:      Time Calculation: (mins)     Visit #:       Past Medical/Surgical History:   Medical History[1]  Past Surgical History[2]    Medications and Allergies:   Current Medications[3]   Allergies[4]    History of Present Illness:   Date of Onset:   Hx afib on Eliquis , HTN, HLD, DM, COPD, TIAs, COPD, bipolar disorder, [facility] admit 2/7-2/17/23 for rhabdomyolysis (peak CK 4464 - statin d/c'ed) + vomiting gastroparesis + starvation ketoacidosis + UTI, [facility] admit 2/18-2/20/23 for rhabdomyolysis (peak CK 2509) + transaminitis + bilateral leg weakness + right paresthesias, [facility] ER visit 3/2 for bilateral leg weakness who presents to [facility] with difficulty standing and walking, bilateral leg/hand weakness, bilateral leg/hand/face paresthesias. She noted bilateral leg weakness with her prior [facility] admission 2/7-2/17 and was readmitted for bilateral leg weakness 2/18-2/20. The past 4 days she has had difficulty standing and walking. The past 4 days she notes new bilateral hand and face paresthesias and bilateral hand  weakness. She has some shortness of breath but is speaking in full sentences. She went to [facility] ER 3/2 and was discharged. She saw her PMD who advised she go to an [facility]. At [facility] she got MRI brain and CTL-spine which did not show a cause for her symptoms. [facility] called neurology Dr. Edd who said to consider IVIG for 5 days, LP, EMG. The LP was done at [facility]. Dr. Edd advised transfer to [facility]. These symptoms are sudden onset, moderate intensity, without alleviating factors.  Following admission, patient was seen by neurology consult who performed MRI of the entire spine and brain, without contrast which came back to show no major issues except on the lumbar area L4-L5 disc disease with facet arthrosis and degenerative changes, neurology team recommended LP and CSF studies so far are unrevealing, patient continued to complain of having bandlike pain around the epigastric area & in the back, there was tenderness on the right side of the thoracic vertebral area in the muscle. Consideration of IVIG with a suspicion of GBS, EMG currently pending.     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 Within the confines of motion artifact, no definite focal intramedullary signal abnormality and enhancement within the cervical spinal cord. 2. Degenerative changes appear similar in extent to prior examination. Electronically signed by: Toribio Fus M.D. [facility] DF: 02/26/23     MRI Thoracic Spine W WO Contrast  Result Date: 02/26/2023  Impression:  1. Motion degraded examination. There is suspicion of T2 hyperintense signal abnormality within primarily the mid-dorsal spinal cord at the thoracic  levels as described above. No definite associated enhancement. Findings are nonspecific but could represent sequela of a chronic demyelinating/inflammatory process, or possibly a metabolic abnormality including B12 deficiency. 2. Stable mild degenerative changes. Electronically signed by: Toribio Fus M.D. [facility] DF: 02/26/23     MRI BRAIN WO CONTRAST  Result Date: 01/13/2023  Impression:  IMPRESSION: 1. No acute abnormality. 2. Moderate chronic microvascular ischemic disease. Electronically Signed   By: Gilmore GORMAN Molt M.D.   On: 01/13/2023 15:34     CT HEAD WO CONTRAST  Result Date: 01/12/2023  Impression:  IMPRESSION: CT head: 1.  No evidence of an acute intracranial abnormality. 2. Moderate chronic small vessel ischemic changes within the cerebral white matter. 3. Mild cerebellar atrophy. CT cervical spine: 1.  No evidence of an acute intracranial abnormality. 2. Nonspecific straightening of the expected cervical lordosis. 3. Slight grade 1 retrolisthesis at C5-C6. 4. Cervical spondylosis. Electronically Signed   By: Rockey Childs D.O.   On: 01/12/2023 12:25    Rehab Services Received in the Past Year: ***    Home Environment:    Lives with daughter is usually able to walk around the house, uses a RW for community level ambulation, patient usually requires help from her daughter for LE bathing but otherwise independent    Social History:        Equipment Owned:        Rehabilitation Precautions/Restrictions:          SUBJECTIVE     Prior Functional Level:    Current Functional Limitations: ***  Patient/Caregiver Goals:          OBJECTIVE     General Observation: ***    Vitals: ***    Pain Assessment/ Re-Assessment: ***    Hand Dominance:        Cognition/Neuro Status:        Coordination : ***    Inspection/Posture:        Range of Motion:       Tone/ Spasticity :                    Strength:       Sensation:       Edema :        Skin Integrity: ***    Balance :        Participation and Endurance:          Functional Mobility:   Bed Mobility     Transfers:     Locomotion     Stairs Management         Outcome Measures: ***    Interventions: {MV OP PT Interventions/Modalities:57352}  ***    Education  Audience: {MV OP EDU AUDIENCE:56546}  Learning Preferences:  {MV OP EDU LEARNING PREFERENCE:56547}  Barriers to Learning/Readiness to Learn:  {MV OP EDU COMMUNICATION AJMMPZMD:43451}    Topics:  ***  Mode of Education Provided:  {MV OP EDU FJUZMPJOD:43450}    Learner Response:  {MV OP EDU LEARNER MZDENWDZ:43449}  Further Teaching Required:  {MV OP FURTHER ZIL:43443}      ASSESSMENT     Summary of Evaluation:  Patient is a 68 y.o. year old female with previous medical history of ***, presenting with ***. Upon assessment, patient presents with  , impacting the ability to ***. Consequently, patient is unable to resume roles in ***. Patient's prognosis to return to *** is ***, due to ***. Patient will benefit from skilled outpatient Physical Therapy services for   in order to ***.      Support Structure: {MV OP Support Structure:55958}    Rehabilitation Prognosis/Evidence:       Rehabilitation Potential: {MV OP Rehab Prognosis:55959}    Motivation/Commitment to Therapy: {MV OP Motivation/Commitment to Therapy:55960}    Medical Necessity:   {MV OP PT Medical Necessity YES:56017}  {MV OP PT MEDICAL NECESSITY NO:56019}    Response to Evaluation: {MV OP Response to Evaluation:55963}    Goals:    Long Term Goal:   Status:   LTG 1:   {MV OP GOAL STATUS:56161}   LTG 2:   {MV OP GOAL STATUS:56161}   LTG 3:   {MV OP GOAL STATUS:56161}   LTG 4:   {MV OP GOAL STATUS:56161}   LTG 5:   {MV OP GOAL STATUS:56161}     Short Term Goal:   Status:   STG 1:   {MV OP GOAL STATUS:56161}   STG 2:   {MV OP GOAL STATUS:56161}   STG 3:   {MV OP GOAL STATUS:56161}   STG 4:   {MV OP GOAL STATUS:56161}   STG 5:   {MV OP GOAL DUJULD:43838}         PLAN/RECOMMENDATIONS    Treatment Frequency/Duration: Skilled  outpatient Physical Therapy is recommended for  .     Interventions/Modalities: {MV OP PT Interventions/Modalities:57352}    Recommended Equipment:      Recommended Consults:      Development of Plan of Care: {MV OP Development of Plan of Rjmz:44035}    The patient/family has been instructed to contact the clinic if any questions or problems should arise.    Geni Maillard, DPT                   [1]   Past Medical History:  Diagnosis Date    Anemia     Asthma     Atrial fibrillation     Chronic obstructive pulmonary disease     Convulsions     Depression     Diabetes mellitus     diet controlled     Hyperlipidemia     Hypertension     Stiff person syndrome     TIA (transient ischemic attack)    [2]   Past Surgical History:  Procedure Laterality Date    CARDIAC CATHETERIZATION  2017    HYSTERECTOMY      NON-TUNNELED CATH PLACEMENT [facility]) N/A 01/27/2022    Procedure: BRETTA;  Surgeon: Mac Ambrosia, MD;  Location: [facility] CARDIAC CATH;  Service: Interventional Radiology;  Laterality: N/A;    pci      TRIPLE LUMEN DIALYSIS CATH N/A 12/28/2021    Procedure: TRIPLE LUMEN DIALYSIS CATH;  Surgeon: Rosine Camellia GRADE, MD;  Location: [facility] CARDIAC CATH;  Service: Interventional Radiology;  Laterality: N/A;   [3]   Current Outpatient Medications:     acetaminophen  (TYLENOL ) 325 MG tablet,  Take 2 tablets (650 mg total) by mouth every 4 (four) hours as needed for Pain, Disp: 50 tablet, Rfl: 0    albuterol  sulfate HFA (PROVENTIL ) 108 (90 Base) MCG/ACT inhaler, Inhale 2 puffs into the lungs every 6 (six) hours as needed for Wheezing, Disp: 1 each, Rfl: 0    apixaban  (ELIQUIS ) 5 MG, Take 1 tablet (5 mg total) by mouth every 12 (twelve) hours, Disp: 30 tablet, Rfl: 0    budesonide -formoterol  (SYMBICORT ) 80-4.5 MCG/ACT inhaler, Inhale 2 puffs into the lungs daily, Disp: , Rfl:     calcium  carbonate (TUMS) 500 MG chewable tablet, Chew 2 tablets (1,000 mg) by mouth every 6 (six) hours as needed for Heartburn, Disp: 30 tablet, Rfl: 0    diazePAM  (VALIUM )  2 MG tablet, Take 1 tablet (2 mg) by mouth every 8 (eight) hours, Disp: 9 tablet, Rfl: 0    diphenhydrAMINE  (BENADRYL ) 25 mg capsule, Take 1 capsule (25 mg) by mouth every 6 (six) hours as needed for Itching, Disp: , Rfl:     Farxiga 10 MG Tab, Take 1 tablet (10 mg) by mouth daily, Disp: , Rfl:     ferrous sulfate  324 (65 FE) MG Tablet Delayed Response, Take 1 tablet (324 mg) by mouth every other day, Disp: 30 tablet, Rfl: 0    HYDROmorphone  (DILAUDID ) 2 MG tablet, Take 1 tablet (2 mg) by mouth every 6 (six) hours as needed (severe pain), Disp: 9 tablet, Rfl: 0    LORazepam  (ATIVAN ) 2 MG tablet, Take 1 tab 1/2 hr prior to MRI and 2nd tablet just before going in scanner, Disp: 4 tablet, Rfl: 0    lurasidone  (LATUDA ) 120 MG Tab, Take 1 tablet (120 mg) by mouth every evening, Disp: , Rfl:     metoprolol  succinate XL (TOPROL -XL) 25 MG 24 hr tablet, Take 1.5 tablets (37.5 mg) by mouth every 12 (twelve) hours, Disp: , Rfl:     midodrine  (PROAMATINE ) 10 MG tablet, Take 1 tablet (10 mg) by mouth 2 (two) times daily with meals At 8am and 12pm, Disp: , Rfl:     Mycophenolate  Sodium (Mycophenolic Acid ) 360 MG Tablet Delayed Response, TAKE 1 TABLET (360 MG) BY MOUTH NIGHTLY FOR 7 DAYS, THEN 1 TABLET (360 MG) 2 (TWO) TIMES DAILY FOR 7 DAYS, THEN 2 TABLETS (720 MG) 2 (TWO) TIMES DAILY FOR 7 DAYS, THEN 3 TABLETS (1,080 MG) 2 (TWO) TIMES DAILY., Disp: 540 tablet, Rfl: 0    naloxone  (NARCAN ) 4 MG/0.1ML nasal spray, 1 spray intranasally. If pt does not respond or relapses into respiratory depression call 911. Give additional doses every 2-3 min., Disp: 2 each, Rfl: 0    pantoprazole  (PROTONIX ) 40 MG tablet, Take 1 tablet (40 mg total) by mouth daily, Disp: 30 tablet, Rfl: 0    polyethylene glycol (MIRALAX ) 17 g packet, Take 17 g by mouth daily, Disp: , Rfl:     rOPINIRole  (REQUIP ) 1 MG tablet, Take 1 tablet (1 mg total) by mouth nightly, Disp: 30 tablet, Rfl: 0    senna-docusate (PERICOLACE) 8.6-50 MG per tablet, Take 2 tablets by  mouth nightly, Disp: , Rfl:     tamsulosin  (FLOMAX ) 0.4 MG Cap, Take 1 capsule (0.4 mg) by mouth Daily after dinner, Disp: , Rfl:     thiamine  (B-1) 100 MG tablet, Take 1 tablet (100 mg) by mouth daily, Disp: 30 tablet, Rfl: 0    vitamins/minerals Tab, Take 1 tablet by mouth daily, Disp: 30 tablet, Rfl: 0  [4]   Allergies  Allergen Reactions    Singulair [Montelukast] Itching    Immune Globulin  (Human) Itching and Facial Swelling     Pt complain of itchiness and swelling of lips and tongue. Saturation fine, bp elevated.    Myoview [Technetium-55m] Itching    Atorvastatin  Other (See Comments)     Pt states she thinks it caused her to have rhabdo    Contrast [Iodinated Contrast Media]     Latex     Magnesium  Chloride Itching    Nitroglycerin     Penicillins     Sulfamethoxazole-Trimethoprim Itching    Tetracyclines & Related     Codeine Rash

## 2023-11-13 NOTE — Unmapped External Note (Signed)
 [facility]  Integrated Care Management  [facility]  [name], MD    Monthly CCM Outreach     [name] is a 68 y.o. female patient.  Complex Chronic Care Management Coding Initiation - Medicare FFS Only    [name]  April 24, 1956    This patient has agreed and consented verbally to Chronic Care Management (CCM) services for management of two or more chronic conditions: List all applicable conditions HTN, CHF, TIA, COPD, DM  One of which is uncontrolled: List condition HTN. The patient meets 2 of the 3 moderate high risk decision making elements.    Patient is aware that services can be discontinued at any time, and that there is a monthly copayment required for the service.      AMB CM CONT FOLLOW-UP:   Assessment completed with:  Patient    HIPPA information verified:  Address and DOB      Visit Location:  Telephonic    Purpose of call:  Uncontrolled disease state  Insurance:  Medicare  Psychosocial:  Caregiver available  Medication review:  Adherence or non-adherence    General Interventions:  Provide patient with Care Manager contact information, Explain the role of RN Care Manager to patient/family and Educate patient regarding importance of keeping regularly scheduled appointments with PCP and specialists    Closing Gaps:  No    Does patient have any barriers to medication compliance?:  None  Community Resources:  No    Durable Medical Equipment:  Youth Worker  Education:  Diet  Teaching recipient:  Patient  Teachback:  Fair  Advanced Directive:  None    Plan:  Continue to follow in case management        Time spent:  < 60 minutes      Assessment:  Feeling ok; swelling still present in lower legs  Dizziness spells - be careful when looking up from laptop and not move head too quickly   Outpatient PT - supposed to start 1/14 (weather delay) but now restarting 2/6   Weight stable at 200lbs; stable with sleep; and wearing compression socks   Staying inside and warm     Constitutional:  Negative.    Skin: Negative.    Eyes: Negative.    Cardiovascular:  Positive for leg swelling (stable).   Respiratory: Negative.     Gastrointestinal: Negative.    Endocrine: Negative.   Genitourinary: Negative.    Musculoskeletal:  Positive for myalgias (ongoing - stiff person syndrome) and arthralgias.   Neurological: Positive for dizziness and weakness (starting outpatient PT).  Psychiatric/Behavioral:  Negative for sleep disturbance.      Education/Interventions Provided:  Discussed healthy eating and trying to increase fresh fruits and vegetables into diet.  Discussed the benefits of physical therapy    No further questions or concerns at conclusion of conversation.   Will continue to follow for chronic care management.           Active Goals          Patient Goals    1. Cope with Chronic Pain      Follow Up Date 12/11/2023     - learn relaxation techniques  - practice acceptance of chronic pain  - tell myself I can (not I can't)  - use relaxation during pain      Why is this important?    Feelings like depression, anxiety, stress and anger can make your body more sensitive to pain.  Learning ways to cope with feelings may help you find some relief from the pain.      Notes:       2. Manage Pain      Follow Up Date: 12/11/2023       - call for medicine refill 2 or 3 days before it runs out  - develop a personal pain management plan  - keep track of prescription refills  - prioritize tasks for the day  - track times pain is worst and when it is best  - track what makes the pain worse and what makes it better  - use cold or heat for pain relief      Why is this important?    Day-to-day life can be hard when you have chronic pain.   Pain medicine is just one option to manage pain.    You can try these action steps to help you manage your pain.      Notes:       3. Track and Manage My Blood Pressure      Follow up date 12/11/2023     - check blood pressure daily  - choose a place to take my blood pressure (home,  clinic or office, retail store)  - write blood pressure results in a log or diary      Why is this important?    You won't feel high blood pressure, but it can still hurt your blood vessels.   High blood pressure can cause heart or kidney problems. It can also cause a stroke.   Making lifestyle changes like losing a little weight or eating less salt will help.   Checking your blood pressure at home and at different times of the day can help to control blood pressure.   If the doctor prescribes medicine remember to take it the way the doctor ordered.   Call the office if you cannot afford the medicine or if there are questions about it.       Notes:               Home Medication List - Marked as Reviewed on 11/02/23 1317   Medication Sig   Ammonium Lactate 12 % Top CREA PLEASE SEE ATTACHED FOR DETAILED DIRECTIONS   apixaban  (ELIQUIS ) 5 mg PO TABS Take 1 Tab by Mouth Twice Daily.   dapagliflozin propanediol (FARXIGA) 10 mg PO TABS Take 1 Tab by Mouth Once a Day.   diazePAM  (VALIUM ) 5 mg PO TABS Take 1 Tab by Mouth 2 (two) times a day.   EPINEPHrine (EPIPEN) 0.3 mg/0.3 mL Inj AtIn Inject 0.3 mL into the muscle Take As Needed for Other (ANAPHYLAXIS). Q 10-30 minutes prn for ANAPHYLAXIS ONLY   gabapentin  (NEURONTIN ) 100 mg PO CAPS Take 1 Cap by Mouth 3 Times Daily.   lamoTRIgine  (LAMICTAL ) 100 mg PO TABS Take 1 Tab by Mouth Once a Day. Indications: epilepsy   lurasidone  (LATUDA ) 40 mg PO TABS TAKE 1 TABLET DAILY WITH FOOD (AT LEAST 350 CALORIES)   lurasidone  120 mg PO TABS Take 1 Tab by Mouth Once a Day.   MAGNESIUM  GLYCINATE PO Take 100 mg by Mouth Once a Day. Indications: patient states she takes this over the counter   metoprolol  XL (TOPROL  XL) 25 mg PO TB24 Take 1 Tab by Mouth Once a Day.   pantoprazole  (PROTONIX ) 40 mg PO TBEC TAKE 1 TABLET BY MOUTH TWICE A DAY   potassium chloride  ER (K-DUR;KLOR-CON  M) 10 mEq  PO tablet,extended release (part/cryst) Take 1 Tab by Mouth 3 times a day.   promethazine  (PHENERGAN ) 25 mg  PO TABS Take 1 Tab by Mouth Take As Needed.   QUEtiapine  SR (SEROQUEL  XR) 50 mg PO TB24 Take 1 Tab by Mouth Every 24 Hours. Indications: manic-depression   rOPINIRole  (REQUIP ) 0.5 mg PO TABS TAKE 2 TABS BY MOUTH EVERY NIGHT AT BEDTIME.   torsemide (DEMADEX) 20 mg PO TABS Take 1 Tab by Mouth Once a Day.  Patient taking differently: Take 1 Tab by Mouth Twice Daily.   traMADoL  (ULTRAM ) 50 mg PO TABS Take 1 Tab by Mouth 2 Times Daily As Needed. 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 Essential hypertension   . Paroxysmal atrial fibrillation (HCC)   . Bipolar 1 disorder (HCC)   . Iron deficiency anemia due to chronic blood loss   . Hypokalemia   . Pure hypercholesterolemia   . Gastroesophageal reflux disease without esophagitis   . Chronic headache   . History of nuclear stress test in 05/2108   . History of cardiac catheterization no obstructive cad in 2017    . Transient neurologic deficit   . TIA (transient ischemic attack)   . Abnormal EKG   . Vertigo   . Slurred speech   . Headache   . Weakness of left upper extremity   . Progressive focal motor weakness   . History of cardiac cath no obstructive cad in cath in 2014 and 2017   . Hx of TIA (transient ischemic attack) and stroke 08/2016   . Right sided weakness   . Left-sided weakness   . Seizure (HCC)   . Chronic anemia   . Bleeding per rectum   . Syncope   . Iron deficiency anemia, unspecified   . Migraine   . Facial droop   . Left hand paresthesia   . Fall   . Unable to coordinate sucking, swallowing, and breathing   . Left sided numbness   . Chronic diastolic (congestive) heart failure (HCC)   . Acute focal neurological deficit   . Intractable headache   . Facial paresthesia   . Non-traumatic rhabdomyolysis   . Paresthesia   . Acute encephalopathy   . Acute on chronic anemia   . UTI (urinary tract infection)   . Suprapubic catheter dysfunction, initial encounter (HCC)   . Malfunction of indwelling  urinary catheter (HCC)   . Syncope and collapse   . Neuropathy   . Neurogenic bladder   . Diastolic heart failure (HCC)   . Vitamin B6 induced neuropathy (HCC)       Past Medical History:   Diagnosis Date   . Acute, but ill-defined, cerebrovascular disease     TIA   . Arthropathy     knees   . Asthma    . Bipolar disorder (HCC)    . Cardiac arrhythmia     h/o Afib on Eliquis    . Cerebral artery occlusion with cerebral infarction [facility])     CVA post endodartorectomy   . Chest pain     has history multiple admissions for this, stress test and cardiac cath negative   . Chronic anemia     Hgb ~10   . Chronic obstructive pulmonary disease (HCC) 01/19/2017   . COPD (chronic obstructive pulmonary disease) (HCC)    . Cough    . COVID    .  COVID-19 vaccine series completed    . CVA (cerebral vascular accident) (HCC) 09/01/2021   . Deficiency anemia 11/2020    rx with Iron Infusions   . Epilepsy (HCC)     per pt last Sz was approx 9-10 months ago   . Esophageal reflux    . Headache    . 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 Hemorrhoids    . History of blood transfusion     following one of her pregnancy   . History of cardiac catheterization no obstructive cad in 2017    . Hyperlipidemia    . Hyperlipidemia    . Hypertension    . Lower leg edema    . Migraines    . Paroxysmal atrial fibrillation (HCC)     on anticoagulation   . Seizure-like activity (HCC)     EEG negative   . Stable angina pectoris (HCC) 02/15/2018   . Stiff person syndrome    . Syncope     recurrent, has loop recorder   . Thoracic aortic aneurysm (HCC)    . TIA (transient ischemic attack)     multiple since age 70's per patient. Multiple MRI brain in the past negative for evidence of stroke. Suspected complicated migraine.   . Type 2 diabetes mellitus (HCC)     not on medication   . Unable to coordinate sucking, swallowing, and breathing 09/28/2019       Patient Care Team:  Trudy Mina, MD as PCP - General (Family Practice)  Darrold Elspeth PARAS, Pharmacist (Ambulatory Pharmacist)  Read Papas, MD as Cardiologist (Cardiology)  Chan, Farn, MD as Hematologist (Hematology and Oncology)  Josepha Marinell BROCKS, MD as Gastroenterologist (Gastroenterology)  Dr. Tex as Psychiatrist (Psychiatry)  Josephus Borg, NP as Urologist (Urology)  Delores Aubry HERO, RN as RN    Aubry Jama Delores, BSN, RN, CNOR  RN Integrated Care Manager  [facility] Services Division  [phone]

## 2023-11-15 ENCOUNTER — Ambulatory Visit: Payer: Medicare Other | Admitting: Rehabilitative and Restorative Service Providers"

## 2023-11-21 ENCOUNTER — Ambulatory Visit
Admission: RE | Admit: 2023-11-21 | Payer: Medicare Other | Source: Ambulatory Visit | Admitting: Rehabilitative and Restorative Service Providers"

## 2023-11-23 ENCOUNTER — Ambulatory Visit: Payer: Medicare Other | Attending: Neurology | Admitting: Neurology

## 2023-11-23 ENCOUNTER — Encounter: Payer: Self-pay | Admitting: Neurology

## 2023-11-23 VITALS — BP 121/77 | HR 83 | Resp 16 | Ht 65.0 in | Wt 206.0 lb

## 2023-11-23 DIAGNOSIS — E559 Vitamin D deficiency, unspecified: Secondary | ICD-10-CM | POA: Insufficient documentation

## 2023-11-23 DIAGNOSIS — G959 Disease of spinal cord, unspecified: Secondary | ICD-10-CM | POA: Insufficient documentation

## 2023-11-23 DIAGNOSIS — E538 Deficiency of other specified B group vitamins: Secondary | ICD-10-CM | POA: Insufficient documentation

## 2023-11-23 DIAGNOSIS — G9589 Other specified diseases of spinal cord: Secondary | ICD-10-CM | POA: Insufficient documentation

## 2023-11-23 DIAGNOSIS — R269 Unspecified abnormalities of gait and mobility: Secondary | ICD-10-CM | POA: Insufficient documentation

## 2023-11-23 DIAGNOSIS — G629 Polyneuropathy, unspecified: Secondary | ICD-10-CM | POA: Insufficient documentation

## 2023-11-23 MED ORDER — MYCOPHENOLIC ACID 360 MG PO TBEC
1440.0000 mg | DELAYED_RELEASE_TABLET | Freq: Two times a day (BID) | ORAL | 3 refills | Status: AC
Start: 2023-11-23 — End: 2024-11-22

## 2023-11-23 NOTE — Patient Instructions (Signed)
 [facility]  MULTIPLE SCLEROSIS & NEUROIMMUNOLOGY CENTER  Telephone: [phone]  Fax: [phone]  Send [place] a mychart or inbasket message for the fastest response  ~~~~~~~~~~~~~~~~~~~~~~~~~~~~~~~~~~~~~~~~~~~~~~~~~~~~~~~~~~~~~~~~~~    WELCOME TO THE NEUROIMMUNOLOGY CLINIC!    MULTIPLE SCLEROSIS AND NEUROIMMUNE DISEASE UPDATE  (Updated 08/10/2023)    1) Clinic Procedures  We only do in-person visits   For over 2000 years, medicine has been a face to face, personal enterprise -- and the best medicine happens face to face. Although telemedicine is very convenient (and was necessary during the pandemic), I've seen poor outcomes with telemedicine care. In-person evaluations are critical part of you doing well. As such we do not offer video visits.     Regular, ongoing, follow up visits is also important. Each person is different, but we see the average patient every 3-6 months. We may not refill controlled substances if you haven't been seen within 6 months, and we may not refill other medications if you haven't been seen within 12 months.    Reaching [place]  - Mychart is best. We will respond within 2-3 business days. (We do not use regular email as policy)  - My nurses' direct phone [phone]. The main neurology line is [phone]. Leave a message and we will get back to you in a few days.  - After hours, the main line [phone]) will page the on-call neurologist.    You will probably hear back from my nurses. I communicate with them throughout the day. The advice they give you is coming from me - please take it seriously.    Emergency / After Hours Care.   If you have questions after hours, call the main number [phone]) and you will reach the on-call neurologist who can help you.    If you have severe, acute or debilitating symptoms, go to the emergency room. But also let us know via mychart or a phone call. I can best help you if you are at an [facility] hospital. You might not have a choice where  the ambulance takes you; if you are not at an [facility] hospital, I'll try to help you the best I can. But I probably can't see everything that's going on.    Urgent appointments are available within a few days. My nurses have access to more appointment slots than the call center does.     Testing. Details matter.  The tests I order are specific and high-quality MS care requires advanced technology    - MRI's: we prefer to work with [facility] radiology or [place] imaging. Some of our patients are enrolled in studies at [facility], and that works as well. If you have had outside MRI's done previously, please bring the CD's so that we can upload them.    - LABS: Go to the lab indicated on the top of your lab order. Do not take an [place] lab slip to [facility]- they will do it incorrectly. Do not go to [facility] or your primary care doctor. Most patients are sent to [facility] lab.    - Results of testing will be communicated once they all return at follow-up visits. Many of the tests are nuanced and must be interpreted within the overall clinical picture. I do not call for results.      2) Testing Phone Numbers  MRI & CT:  [facility] at [phone]  [facility] Fax: [phone]  [facility] at [phone]  For insurance approval, reach out to them first.  If there is a denial, let us know ASAP as we can help.     LP / Spinal Tap: Call [phone] for Specialized Imaging Scheduling. Backup is [facility] at [phone]    Labs: Any [place] location. [email or web address]    3) Doing well with Neuroimmune Disease  Symptoms don't tell you how the disease is doing.  You can experience more symptoms even if the disease isn't worsening. Fluctuating symptoms are expected. Environmental factors like infections, extremes of heat or cold, stress (emotional or physiologic), fatigue, infections, other medical diseases, and certain foods may make you feel more symptoms. That's not the disease worsening, it's not more  neurologic damage, and it's not a relapse. Those are worsening symptoms. Most neuroimmune patients have good days & bad days. You might need symptomatic medications to help you function well.    Also, some diseases worsen without producing more symptoms.     In general, if you have a question about your symptoms or notice any new symptoms, please reach out.      Monitoring the Disease and Medication Response:  MS and neuroimmune diseases are caused by your body's immune system attacking the nervous system (brain, spinal cord, nerves or muscles). We use immune treatments to prevent more damage. We can measure whether those treatments are working through objective testing - labs, MRI's, EMGs, etc.    For MS, NMO or MOG, we know that everyone develop more lesions, neurologic problems on exam and symptoms unless they are treated (Dr Loura Back in [place] proved this). MRI is one of the best ways of detecting more damage - it shows up as a new lesion. The clinical exam and labs can also help [place]. In MS and MOG, a relapse without symptoms is very common, so we do frequent labs and yearly MRIs to monitor the disease. This is very important to detect subclinical progression of disease.     For other neuroimmune diseases -- eg,  myasthenia, CIDP/GBS, or rheumatologic diseases -- not everyone requires treatment. But we use labs, exam, MRI's and/or EMG to monitor the diseases    Medications are the start, but they aren't everything. They won't repair damage that already happened, and they may not help symptoms. It is to prevent more damage.     Physical and Cognitive Activity Promote Neurologic Recovery: In order to improve the disease, the brain has to rewire around the damaged nerves. The best way to do this is by being active - physically and mentally. Physical, occupational or cognitive therapy can teach you exercises to address specific problems. Physical activity has been shown to improve energy, mood, and reduce pain.  Be as physically and mentally active as you can; learn to pace yourself.    Diet: There is no such thing as an "MS diet" or "anti-inflammatory diet". Foods don't affect MRI lesions or prevent neurologic damage, but a good or bad diet can markedly impact symptoms. My suggestion is to eat healthy - we all know what that is! Diets like the Wahl's protocol or Swank diet have ideas worth trying, but don't feel obligated to follow them precisely. See if adding fiber, reducing gluten, reducing dairy or eliminating processed foods makes a difference in how you feel.    Vitamins and Supplements  Vitamins can affect neurologic functioning:  - Vitaimn D and MS. High vit D levels correlate with better outcomes in MS patients. Target a total (25-OH) Vit D level between 50-100. Vitamin D3 (CHOLEcalciferol; white pill) works  better than Vitamin D2 (ERGOcalciferol; green pill). You can get high doses of vitamin D3 from [facility] (eg, 10,000 or 50,000 units).     - Target vit B12 level above 500. Levels below 400 can cause nerve & spinal cord degeneration. Levels above the normal range are perfectly safe.    - Vitamin B6 (pyridoxine) and zinc toxicity cause neuropathy. Do not take B-complex or zinc. Other sources include multi-vitamins, some energy drinks, and other supplements. Check the back of the bottle, not just the front. Allena Katz CD. "Pyridoxine toxicity courtesy of your local health food store" Ann Rheum Dis. 2006 Dec;65(12):1666-7]    - marked copper deficiency also causes nerve & spinal cord degeneration    - the data around other supplements like biotin, alpha-lipoic acid, etc is unclear, so I generally don't recommend them    Support Groups  MS Group - [place] (via Zoom) Astrid Divine, for MS, NMO, MOG. Her cell is [phone].  MS Group - Benna Dunks [phone]    National MS Society [facility]): [email or web address].com.[email or web address]  Myasthenia Gravis Foundation of [place] (MGFA):  MoralGame.si  [employer] for NMO: https://www.sumairafoundation.org/  [employer] for NMO: https://guthyjacksonfoundation.org/  Foundation for Sarcoid Research  DiscFull.com.cy    3) Neurologic Disease, Immune Suppressants and Infections  - MS, NMO, MOG, and GBS/CIDP themselves do not increase your risk of infection (including COVID). Severe myasthenia might increase your risk for lung infections    - Call us if you develop a fever more than 101 F, have a productive cough longer than 2 weeks, or if you have recurring or difficult-to-treat infections. Immune suppressants can increase infection risk. You might need antibiotics, or stronger antibiotics than other people your age.    - None of the MS medications increase your COVID risk above that of an untreated patient (COVIMS study)    - Infections can cause more symptoms. That's usually not disease worsening or neurologic damage from infection. That is symptom worsening, similar to what happens with stress or the heat. You will get better a few days after the infection improves.    - Rarely, COVID can cause neurologic complications. Early on, with severe COVID, we saw a lot of strokes and seizures. We see less of that now. But, we are seeing the emergence of a post-COVID neurologic syndrome that looks like Guillain-Barre syndrome or transverse myelitis. The way to test for this is with a spinal tap (lumbar puncture). Neuro-COVID is treatable.    4) Vaccines  Safe Vaccines, regardless of immunosuppressant  - Pfizer, [place], Novavax and Booster COVID vaccines are safe in MS and with all immunosuppressants. (There were issues with J&J and Astra-Zeneca).    - Flu vaccine. Use Flublok brand only (recombinant hemagluttin quadrivalent). This is per FDA guidance for all neurologic patients. It can be difficult to find and we can give you an RX for it.     - Shingles vaccine. Use Shingrix (RZV,  recombinant zoster) vaccine. This is per FDA guidance for all neurologic patients.     - All pneumonia vaccines are safe (Prevnar & Capvaxive )    - Tetanus toxoid and TDAP are ok (TDAP is preferred over DTAP).     Unsafe Vaccines (for any neuroimmune patients)  - Live virus or inactivated virus vaccines are contraindicated in neuro-immunologic patients on any immunotherapy.    - monkeypox vaccines (JYNNEOS and ACAM2000) are contraindicated in MS and patients on immunosupressants.     - RSV vaccines are associated  with cases of Guillian Barre syndrome, so I would avoid them.     - J&J and Astra Zeneca COVID vaccine were associated with transverse myelitis    Notes:  - [employer]: The contraindication and timing requirements do not apply to most vaccines. They are only for live virus vaccines, which are contraindicated anwyays    - You don't need to time your vaccinations with infusion dates. There have been theoretical concerns about immunosuppression affecting vaccine effectiveness, but recent research has shown this not to be true.    - MS is compatible with pregnancy but we need to know if you plan on getting pregnant or become pregnant as we may need to adjust your treatment. Certain disease modifying treatments for MS are better than others for those seeking to become pregnant.       Thank you,      Lora Havens, MD PHD  Director, Neuroimmunology & MS Center  [facility]  [address], [address], [place], [place] [address]    Telephone [phone]  Fax [phone]   The fastest response is via [place]

## 2023-11-23 NOTE — Progress Notes (Signed)
 [facility]  MULTIPLE SCLEROSIS & NEUROIMMUNOLOGY CENTER  Telephone: [phone]  Fax: [phone]  Send us  a mychart or inbasket message for the fastest response  ~~~~~~~~~~~~~~~~~~~~~~~~~~~~~~~~~~~~~~~~~~~~~~~~~~~~~~~~~~~~~~~~~~~~~~~~~~~~~~~~~~~~  Visit Date: 11/23/2023    Referring Neurologist:     CC: autoimmune dorsal column myelopathy     HPI:   History was obtained from records review, patient,     68 y.o. year-old female.    She is on MYP 1080mg  bid    Legs feel tight, balance is still 'not great'  But not falling anymore  Energy is ok  Mostly using a cane - fairly far  She stopped using a cathether; occasional bladder accidents, < 1x/week  Still feels like she walks on marbles.       EXAM  Visit Vitals  BP 121/77 (BP Site: Left arm, Patient Position: Sitting, Cuff Size: Large)   Pulse 83   Resp 16   Ht 1.651 m (5' 5)   Wt 93.4 kg (206 lb)   LMP  (LMP Unknown)   SpO2 97%   BMI 34.28 kg/m     General:   Optic Nerves:   Psychiatric.   Mental Status: The patient was awake, alert, appropriate  Cranial Nerves: CN II-XII wnl except right face weak  Motor: 5/5 except 4+/5 bilat handgrip  Reflex:   Sensation: mildLUE sensory ataxia  Coordination:   Gait: pos romberg. Cannot tandem      Imaging     Reports:  MRI Cervical Spine W WO Contrast    Result Date: 02/26/2023  Impression:  1. Within the confines of motion artifact, no definite focal intramedullary signal abnormality and enhancement within the cervical spinal cord. 2. Degenerative changes appear similar in extent to prior examination. Electronically signed by: Toribio Fus M.D. [facility] DF: 02/26/23    MRI Thoracic Spine W WO Contrast    Result Date: 02/26/2023  Impression:  1. Motion degraded examination. There is suspicion of T2 hyperintense signal abnormality within primarily the mid-dorsal spinal cord at the thoracic levels as described above. No definite associated enhancement. Findings are nonspecific but could  represent sequela of a chronic demyelinating/inflammatory process, or possibly a metabolic abnormality including B12 deficiency. 2. Stable mild degenerative changes. Electronically signed by: Toribio Fus M.D. [facility] DF: 02/26/23    MRI BRAIN WO CONTRAST    Result Date: 01/13/2023  Impression:  IMPRESSION: 1. No acute abnormality. 2. Moderate chronic microvascular ischemic disease. Electronically Signed   By: Gilmore GORMAN Molt M.D.   On: 01/13/2023 15:34    CT HEAD WO CONTRAST    Result Date: 01/12/2023  Impression:  IMPRESSION: CT head: 1.  No evidence of an acute intracranial abnormality. 2. Moderate chronic small vessel ischemic changes within the cerebral white matter. 3. Mild cerebellar atrophy. CT cervical spine: 1.  No evidence of an acute intracranial abnormality. 2. Nonspecific straightening of the expected cervical lordosis. 3. Slight grade 1 retrolisthesis at C5-C6. 4. Cervical spondylosis. Electronically Signed   By: Rockey Childs D.O.   On: 01/12/2023 12:25       Testing       Labs  Lab Results   Component Value Date    VITB6 3.6 05/30/2023    HGBA1C 6.0 (H) 05/30/2023    EGFR 57 (L) 05/30/2023    CREAT 1.08 (H) 05/30/2023    WBC 6.1 08/08/2023    LYMPHOABS 2.5 08/08/2023    IGG 984 08/10/2023    IGA 477 (H) 05/30/2023  IGM 52 05/30/2023    B12 452 05/30/2023    VITD 13.6 (L) 05/30/2023     11/02/23  WBC  4.0 - 11.0 K/uL 6.3   RBC  3.80 - 5.20 M/uL 4.27   HGB  11.7 - 16.1 g/dL 89.1 Low    Platelet  859 - 440 K/uL 301   Absolute Neutrophils (Auto)  1.8 - 7.7 K/uL 3.5   Absolute Lymphocytes (Auto)  1.0 - 4.8 K/uL 1.9   Absolute Monocytes (Auto)  0.1 - 1.0 K/uL 0.4   Absolute Eosinophils (Auto)  0.0 - 0.5 K/uL 0.4   Absolute Basophils (Auto)  0.0 - 0.2 K/uL 0     Potassium  3.5 - 5.5 mmol/L 3.6   Sodium  133 - 145 mmol/L 142   Chloride  98 - 110 mmol/L 102   Glucose  70 - 99 mg/dL 85   Calcium   8.4 - 10.5 mg/dL 9.3   Albumin   3.5 - 5.0 g/dL 3.8   SGPT (ALT)  5 - 40 U/L 11    SGOT (AST)  10 - 37 U/L 18   Bilirubin Total  0.2 - 1.2 mg/dL 0.2   Alkaline Phosphatase  40 - 120 U/L 101   BUN  6 - 22 mg/dL 9   CO2  20 - 32 mmol/L 30   Creatinine  0.8 - 1.4 mg/dL 1   eGFR  >39.9 fO/fpw/8.26 sq.m. 59.3 Low    Globulin  2.0 - 4.0 g/dL 3.3   A/G Ratio  1.1 - 2.6 ratio 1.2   Total Protein  6.2 - 8.1 g/dL 7.1   Anion Gap  3.0 - 15.0 mmol/L 10     ASSESSMENT / PLAN  RTC: Return in about 2 months (around 01/21/2024) for [place].    1. Myelopathy    2. Autoimmune disorder of spinal cord    3. Vitamin B12 deficiency    4. Vitamin D  deficiency    5. Sensory neuropathy    6. 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 She is starting to improve on MYP (now w/ cane)      Rheumatologic serology, serum and CSF encephalopathy panels, nmo & MOG were negative. CSF GAD65 neg;  she does not have stiff person syndrome.    - stop diazepam ; cont w/ dr reggie cherrick for pain       PLAN  - increase MYP 4 tabs bid  - vit b12 1000mcg daily  - vit d3 50k/week  - gait & balance PT        Orders today (details below)  Orders Placed This Encounter   Procedures    Referral to Physical [facility] HOD     Orders Placed This Encounter   Medications    Mycophenolate  Sodium (Mycophenolic Acid ) 360 MG Tablet Delayed Response     Sig: Take 1,440 mg by mouth 2 (two) times daily     Dispense:  720 tablet     Refill:  3     Autoimmune myelitis; provider is rems certified. Labs being monitored.       I spent 40 minutes reviewing the records, talking with the patient and developing their care plan. This excludes time for separately billed procedures.      Sammi Lockwood, MD PHD  Director, Neuroimmunology & MS Center  [facility]  [employer]  [address], [place], [place] [address]    Telephone [phone]  Fax  [phone]   The fastest response is via national city  ===================================================================  NFL (neurofilament light chain) Insurance Justification  Serum / plasma Neurofilament light chain is a well-established biomarker for monitoring MS, and regular testing has been incorporated into the Consortium of MS Centers [facility]) guidelines for the monitoring of MS. https://mscare.http://www.stevens.com/     PATIENT INSTRUCTIONS PROVIDED  Patient was given an After Visit Summary with a copy of the testing orders, medications and the following other instructions:  Patient Instructions   [facility]  MULTIPLE SCLEROSIS & NEUROIMMUNOLOGY CENTER  Telephone: [phone]  Fax: [phone]  Send us  a mychart or inbasket message for the fastest response  ~~~~~~~~~~~~~~~~~~~~~~~~~~~~~~~~~~~~~~~~~~~~~~~~~~~~~~~~~~~~~~~~~~    WELCOME TO THE NEUROIMMUNOLOGY CLINIC!    MULTIPLE SCLEROSIS AND NEUROIMMUNE DISEASE UPDATE  (Updated 08/10/2023)    1) Clinic Procedures  We only do in-person visits   For over 2000 years, medicine has been a face to face, personal enterprise -- and the best medicine happens face to face. Although telemedicine is very convenient (and was necessary during the pandemic), I've seen poor outcomes with telemedicine care. In-person evaluations are critical part of you doing well. As such we do not offer video visits.     Regular, ongoing, follow up visits is also important. Each person is different, but we see the average patient every 3-6 months. We may not refill controlled substances if you haven't been seen within 6 months, and we may not refill other medications if you haven't been seen within 12 months.    Reaching us   - Mychart is best. We will respond within 2-3 business days. (We do not use regular email as policy)  - My nurses' direct phone [phone]. The main neurology line is [phone]. Leave a message and we  will get back to you in a few days.  - After hours, the main line [phone]) will page the on-call neurologist.    You will probably hear back from my nurses. I communicate with them throughout the day. The advice they give you is coming from me - please take it seriously.    Emergency / After Hours Care.   If you have questions after hours, call the main number [phone]) and you will reach the on-call neurologist who can help you.    If you have severe, acute or debilitating symptoms, go to the emergency room. But also let us  know via mychart or a phone call. I can best help you if you are at an [facility] hospital. You might not have a choice where the ambulance takes you; if you are not at an [facility] hospital, I'll try to help you the best I can. But I probably can't see everything that's going on.    Urgent appointments are available within a few days. My nurses have access to more appointment slots than the call center does.     Testing. Details matter.  The tests I order are specific and high-quality MS care requires advanced technology    - MRI's: we prefer to work with [facility] radiology or [facility]. Some of our patients are enrolled in studies at [facility], and that works as well. If you have had outside MRI's done previously, please bring the CD's so that we can upload them.    - LABS: Go to the lab indicated on the  top of your lab order. Do not take an [facility] lab slip to [facility]- they will do it incorrectly. Do not go to [facility] or your primary care doctor. Most patients are sent to [facility] lab.    - Results of testing will be communicated once they all return at follow-up visits. Many of the tests are nuanced and must be interpreted within the overall clinical picture. I do not call for results.      2) Testing Phone Numbers  MRI & CT:  [facility] at [phone]  [facility] Fax: [phone]  [facility] at [phone]  For insurance approval, reach out to them first. If there is a denial, let us  know ASAP as we can  help.     LP / Sp[place]: Call [phone] for Specialized Imaging Scheduling. Backup is [facility] at [phone]    Labs: Any [place] location. [email or web address]    3) Doing well with Neuroimmune Disease  Symptoms don't tell you how the disease is doing.  You can experience more symptoms even if the disease isn't worsening. Fluctuating symptoms are expected. Environmental factors like infections, extremes of heat or cold, stress (emotional or physiologic), fatigue, infections, other medical diseases, and certain foods may make you feel more symptoms. That's not the disease worsening, it's not more neurologic damage, and it's not a relapse. Those are worsening symptoms. Most neuroimmune patients have good days & bad days. You might need symptomatic medications to help you function well.    Also, some diseases worsen without producing more symptoms.     In general, if you have a question about your symptoms or notice any new symptoms, please reach out.      Monitoring the Disease and Medication Response:  MS and neuroimmune diseases are caused by your body's immune system attacking the nervous system (brain, spinal cord, nerves or muscles). We use immune treatments to prevent more damage. We can measure whether those treatments are working through objective testing - labs, MRI's, EMGs, etc.    For MS, NMO or MOG, we know that everyone develop more lesions, neurologic problems on exam and symptoms unless they are treated (Dr Oneil Alas in [place] proved this). MRI is one of the best ways of detecting more damage - it shows up as a new lesion. The clinical exam and labs can also help us . In MS and MOG, a relapse without symptoms is very common, so we do frequent labs and yearly MRIs to monitor the disease. This is very important to detect subclinical progression of disease.     For other neuroimmune diseases -- eg,  myasthenia, CIDP/GBS, or rheumatologic diseases  -- not everyone requires treatment. But we use labs, exam, MRI's and/or EMG to monitor the diseases    Medications are the start, but they aren't everything. They won't repair damage that already happened, and they may not help symptoms. It is to prevent more damage.     Physical and Cognitive Activity Promote Neurologic Recovery: In order to improve the disease, the brain has to rewire around the damaged nerves. The best way to do this is by being active - physically and mentally. Physical, occupational or cognitive therapy can teach you exercises to address specific problems. Physical activity has been shown to improve energy, mood, and reduce pain. Be as physically and mentally active as you can; learn to pace yourself.    Diet: There is no such thing as an MS diet or anti-inflammatory diet. Foods don't affect  MRI lesions or prevent neurologic damage, but a good or bad diet can markedly impact symptoms. My suggestion is to eat healthy - we all know what that is! Diets like the Wahl's protocol or Swank diet have ideas worth trying, but don't feel obligated to follow them precisely. See if adding fiber, reducing gluten, reducing dairy or eliminating processed foods makes a difference in how you feel.    Vitamins and Supplements  Vitamins can affect neurologic functioning:  - Vitaimn D and MS. High vit D levels correlate with better outcomes in MS patients. Target a total (25-OH) Vit D level between 50-100. Vitamin D3 (CHOLEcalciferol ; white pill) works better than Vitamin D2 (ERGOcalciferol ; green pill). You can get high doses of vitamin D3 from amazon (eg, 10,000 or 50,000 units).     - Target vit B12 level above 500. Levels below 400 can cause nerve & spinal cord degeneration. Levels above the normal range are perfectly safe.    - Vitamin B6 (pyridoxine ) and zinc  toxicity cause neuropathy. Do not take B-complex or zinc . Other sources include multi-vitamins, some energy drinks, and other supplements. Check the  back of the bottle, not just the front. Candance Cary CD. Pyridoxine  toxicity courtesy of your local health food store Ann Rheum Dis. 2006 Dec;65(12):1666-7]    - marked copper  deficiency also causes nerve & spinal cord degeneration    - the data around other supplements like biotin, alpha-lipoic acid, etc is unclear, so I generally don't recommend them    Support Groups  MS Group - [place] (via Zoom) Elveria Sever, for MS, NMO, MOG. Her cell is [phone].  MS Group - Minette Elvie Breaker [phone]    National MS Society [facility]): malpracticeboard.com.au  Myasthenia Gravis Foundation of America (MGFA): moralgame.si  [employer] for NMO: https://www.sumairafoundation.org/  [employer] for NMO: https://guthyjacksonfoundation.org/  Foundation for [employer]  discfull.com.cy    3) Neurologic Disease, Immune Suppressants and Infections  - MS, NMO, MOG, and GBS/CIDP themselves do not increase your risk of infection (including COVID). Severe myasthenia might increase your risk for lung infections    - Call us  if you develop a fever more than 101 F, have a productive cough longer than 2 weeks, or if you have recurring or difficult-to-treat infections. Immune suppressants can increase infection risk. You might need antibiotics, or stronger antibiotics than other people your age.    - None of the MS medications increase your COVID risk above that of an untreated patient (COVIMS study)    - Infections can cause more symptoms. That's usually not disease worsening or neurologic damage from infection. That is symptom worsening, similar to what happens with stress or the heat. You will get better a few days after the infection improves.    - Rarely, COVID can cause neurologic complications. Early on, with severe COVID, we saw a lot of strokes and seizures. We see less of that now. But, we are seeing the emergence of  a post-COVID neurologic syndrome that looks like Guillain-Barre syndrome or transverse myelitis. The way to test for this is with a spinal tap (lumbar puncture). Neuro-COVID is treatable.    4) Vaccines  Safe Vaccines, regardless of immunosuppressant  - Pfizer, Moderna, Novavax and Booster COVID vaccines are safe in MS and with all immunosuppressants. (There were issues with J&J and Astra-Zeneca).    - Flu vaccine. Use Flublok brand only (recombinant hemagluttin quadrivalent). This is per FDA guidance for all neurologic patients. It can be difficult to find  and we can give you an RX for it.     - Shingles vaccine. Use Shingrix (RZV, recombinant zoster) vaccine. This is per FDA guidance for all neurologic patients.     - All pneumonia vaccines are safe (Prevnar & Capvaxive )    - Tetanus toxoid and TDAP are ok (TDAP is preferred over DTAP).     Unsafe Vaccines (for any neuroimmune patients)  - Live virus or inactivated virus vaccines are contraindicated in neuro-immunologic patients on any immunotherapy.    - monkeypox vaccines (JYNNEOS and ACAM2000) are contraindicated in MS and patients on immunosupressants.     - RSV vaccines are associated with cases of Guillian Barre syndrome, so I would avoid them.     - J&J and Astra Zeneca COVID vaccine were associated with transverse myelitis    Notes:  - [employer]: The contraindication and timing requirements do not apply to most vaccines. They are only for live virus vaccines, which are contraindicated anwyays    - You don't need to time your vaccinations with infusion dates. There have been theoretical concerns about immunosuppression affecting vaccine effectiveness, but recent research has shown this not to be true.    - MS is compatible with pregnancy but we need to know if you plan on getting pregnant or become pregnant as we may need to adjust your treatment. Certain disease modifying treatments for MS are better than others for those seeking to become pregnant.        Thank you,      Sammi Lockwood, MD PHD  Director, Neuroimmunology & MS Center  [facility]  [employer]  [address], [address], [place], [place] [address]    Telephone [phone]  Fax [phone]   The fastest response is via national city    IMPORTED INFORMATION FROM MEDICAL RECORDS   Diagnosis ICD-10-CM Associated Order   1. Myelopathy  G95.9 Referral to Physical [facility] HOD      2. Autoimmune disorder of spinal cord  G95.89 Referral to Physical [facility] HOD      3. Vitamin B12 deficiency  E53.8 Referral to Physical [facility] HOD      4. Vitamin D  deficiency  E55.9 Referral to Physical [facility] HOD      5. Sensory neuropathy  G62.9 Referral to Physical [facility] HOD      6. Neurologic gait dysfunction  R26.9 Referral to Physical [facility] HOD        Orders Placed This Encounter   Procedures    Referral to Physical [facility] HOD     Standing Status:   Future     Standing Expiration Date:   11/22/2024     Referral Priority:   Routine     Referral Type:   Consultation     Referral Reason:   Services Requested     Requested Specialty:   Physical Therapy     Number of Visits Requested:   1

## 2023-11-23 NOTE — Progress Notes (Deleted)
 [facility]  MULTIPLE SCLEROSIS & NEUROIMMUNOLOGY CENTER  Telephone: [phone]  Fax: [phone]  Send us  a mychart or inbasket message for the fastest response  ~~~~~~~~~~~~~~~~~~~~~~~~~~~~~~~~~~~~~~~~~~~~~~~~~~~~~~~~~~~~~~~~~~~~~~~~~~~~~~~~~~~~  Visit Date: 11/23/2023    Referring Neurologist:     CC: ***    HPI:   History was obtained from records review, patient,     68 y.o. year-old female ***      EXAM  Visit Vitals  BP 121/77 (BP Site: Left arm, Patient Position: Sitting, Cuff Size: Large)   Pulse 83   Resp 16   Ht 1.651 m (5' 5)   Wt 93.4 kg (206 lb)   LMP  (LMP Unknown)   SpO2 97%   BMI 34.28 kg/m     General:   Optic Nerves:   Psychiatric.   Mental Status: The patient was awake, alert, appropriate  Cranial Nerves: CN II-XII   Motor:   Reflex:   Sensation:   Coordination:   Gait:       Imaging     Reports:  MRI Cervical Spine W WO Contrast    Result Date: 02/26/2023  Impression:  1. Within the confines of motion artifact, no definite focal intramedullary signal abnormality and enhancement within the cervical spinal cord. 2. Degenerative changes appear similar in extent to prior examination. Electronically signed by: Toribio Fus M.D. [facility] DF: 02/26/23    MRI Thoracic Spine W WO Contrast    Result Date: 02/26/2023  Impression:  1. Motion degraded examination. There is suspicion of T2 hyperintense signal abnormality within primarily the mid-dorsal spinal cord at the thoracic levels as described above. No definite associated enhancement. Findings are nonspecific but could represent sequela of a chronic demyelinating/inflammatory process, or possibly a metabolic abnormality including B12 deficiency. 2. Stable mild degenerative changes. Electronically signed by: Toribio Fus M.D. [facility] DF: 02/26/23    MRI BRAIN WO CONTRAST    Result Date: 01/13/2023  Impression:  IMPRESSION: 1. No acute abnormality. 2. Moderate chronic microvascular  ischemic disease. Electronically Signed   By: Gilmore GORMAN Molt M.D.   On: 01/13/2023 15:34    CT HEAD WO CONTRAST    Result Date: 01/12/2023  Impression:  IMPRESSION: CT head: 1.  No evidence of an acute intracranial abnormality. 2. Moderate chronic small vessel ischemic changes within the cerebral white matter. 3. Mild cerebellar atrophy. CT cervical spine: 1.  No evidence of an acute intracranial abnormality. 2. Nonspecific straightening of the expected cervical lordosis. 3. Slight grade 1 retrolisthesis at C5-C6. 4. Cervical spondylosis. Electronically Signed   By: Rockey Childs D.O.   On: 01/12/2023 12:25       Testing       Labs  Lab Results   Component Value Date    VITB6 3.6 05/30/2023    HGBA1C 6.0 (H) 05/30/2023    EGFR 57 (L) 05/30/2023    CREAT 1.08 (H) 05/30/2023    WBC 6.1 08/08/2023    LYMPHOABS 2.5 08/08/2023    IGG 984 08/10/2023    IGA 477 (H) 05/30/2023    IGM 52 05/30/2023    B12 452 05/30/2023    VITD 13.6 (L) 05/30/2023       ASSESSMENT / PLAN  RTC: No follow-ups on file.    No diagnosis found.    Orders today (details below)  No orders of the defined types were placed in this encounter.    No orders of the defined types were placed in this encounter.  I spent {RDTIMEBILL:62381} minutes reviewing the records, talking with the patient and developing their care plan. This excludes time for separately billed procedures.      Sammi Lockwood, MD PHD  Director, Neuroimmunology & MS Center  [facility]  [employer]  [address], [address], [place], [place] [address]    Telephone [phone]  Fax [phone]   The fastest response is via national city  ===================================================================  NFL (neurofilament light chain) Insurance Justification  Serum / plasma Neurofilament light chain is a well-established biomarker for monitoring MS, and regular testing has been incorporated into the Consortium of MS Centers  [facility]) guidelines for the monitoring of MS. https://mscare.http://www.stevens.com/     PATIENT INSTRUCTIONS PROVIDED  Patient was given an After Visit Summary with a copy of the testing orders, medications and the following other instructions:  There are no Patient Instructions on file for this visit.    IMPORTED INFORMATION FROM MEDICAL RECORDS  No diagnosis found.  No orders of the defined types were placed in this encounter.

## 2023-11-28 ENCOUNTER — Other Ambulatory Visit: Payer: Self-pay | Admitting: Neurology

## 2023-12-01 NOTE — ED Provider Notes (Signed)
 [facility] [place]  [facility] EMERGENCY DEPARTMENT  [address]  [place] [address]  Dept: [phone]  Loc Appt: [phone]  Loc: [phone]         [name] is a 68 y.o. female with history of paroxysmal Afib (on eliquis ), HTN, HLD, DM, TIA/CVA, COPD, asthma, stiff person syndrome, seizure disorder, migraines and bipolar disorder p/w intermittent midsternal chest pain that began approximate 2 days ago associated with left-sided facial droop onset yesterday morning.       Diagnosis   (R29.810) Facial droop    (R07.9) Chest pain, unspecified type      Disposition   Admit      HPI   [name] is a 68 y.o. female with hx of paroxysmal Afib (on eliquis ), HTN, HLD, DM, TIA/CVA, COPD, asthma, stiff person syndrome, seizure disorder, migraines and bipolar disorder who presents with intermittent mid-sternal chest pain that began approx 2 days ago.  Pt states she also noticed a left sided facial droop yesterday morning.  No specific aggravating or relieving factors reported.  Associated symptoms consist of mild HA.  Pertinent negatives include no SOB, palpitations, diaphoresis, dizziness, changes in vision, difficulty with speech or any other focal neurological symptoms.    PMHx:  Afib (on eliquis ), HTN, HLD, DM, TIA/CVA, COPD, asthma, stiff person syndrome, seizure disorder, migraines and bipolar disorder  Soc Hx:  Denies  Fam Hx:  NC  Allergies:  Multiple allergies (see list in record)    PMD/Specialists   PMD:  [name], MD       ROS     Constitutional: No fevers/chills, No fatigue   Head: NC/AT  Eyes: No photophobia or diplopia.   ENT: No neck pain or neck stiffness  Resp: No SOB or cough.    CV: (+) Chest Pain, No Palpitations.   GI: No Nausea, Vomiting, Diarrhea. No abd pain   MSK: No muscle or joint pains   Skin: Negative for rash.   Neuro: (+) Lt facial droop, Negative for dizziness (+) headache.      -Nursing notes reviewed by me.    -Past Medical/Surgical/Family/Social History reviewed by me (as documented by RN notes)       Physical Exam     Patient Vitals for the past 24 hrs:   BP Temp Pulse Resp SpO2   12/02/23 0216 123/54 98 F (36.7 C) 67 9 98 %   12/02/23 0151 126/80 -- 75 10 94 %     Pulse Oximetry Analysis - Normal  Cardiac Monitor Interpretation - NSR    Vital Signs Reviewed  GEN:  Appears comfortable.       Head: Normocephalic, Atraumatic.      Eyes: nl conjunctiva. NO discharge. PERRL, EOMI, no visual field deficits.       ENT: Posterior oropharynx wnl. Moist mucus membranes.       Neck: Full range of motion. No JVD.       Chest:  CTAB, NO respiratory distress.        CV: RRR, NO murmur.        Abd: soft, non-tender, NO distention.           UpperExt: NO deformity. 2+ radial pulses bilaterally.       LowerExt: NO edema. 2+ pp, NO deformity.        Neuro: moving all extremities, aaox3.   Slight Lt facial droop, speech wnl, other focal neurological deficits. NIHSS is 1.  Skin: Warm and dry. NO rash.        Psych: Normal affect. Normal concentration.        Heart Score for Major Cardiac Events  History      1   High   2   Mod   1    Low   0    EKG       1    Significant ST dep 2   Non specific  1   Normal  0    Age       2   >=65   2   45-65   1   <=45   0    Risk Factors                2  (HTN, HLD, DM2, smoking, FHx, obesity)    >3   2   1-2   1   0   0    Troponin      0   >3x normal  2   1-3x normal  1   Normal  0                                         Total Score = 6    Low risk patients have a score <4 and have a less than 2% risk of MACE at 6 weeks. When combined with a normal 3 hour troponin, rate drops to <1%. This has been externally validated in multiple studies. This is similar to the rate of MACE in patients that have undergone stress testing. No need for expedited stress testing in this patient. Explained low risk to patient. If 3 hour cardiac enzyme is WNL, will d/c patient home with plans to follow up with PMD  within next week. Patient agrees with plan.      Six AJ, Backus BE, Kelder JC. Chest pain in the emergency room: value of the HEART score. Neth Heart J. 2008 Jun;16(6):191-6. PubMed PMID: [id number]; PubMed Central PMCID: [id number]    Noble BE, Six AJ, Kelder JC, Bosschaert MA, Mast EG, Mosterd A, Veldkamp RF, [place], Tio R, [address], [place] SH, [employer], Mast TP, [employer], Cramer MJ, Poldervaart JM, [place], [place]. A prospective validation of the HEART score for chest pain patients at the emergency department. Int J Cardiol. 2013 Oct 3;168(3):2153-8. doi: 10.1016/j.ijcard.2013.01.255. Epub 2013 Mar 7. PubMed PMID: [id number].     MDM / DDX / ED Course       DDX:  TIA/CVA, Bell's palsy, MI, ACS: Stable vs unstable angina, Atrial flutter, A-fib, Pleurisy, CHF, Chest wall pain, Reflux, Pneumonia, Biliary colic, PE, Dissection     Plan:  EKG, cXR, Labs, LFTs, troponin, CT head, NIHSS, swallow screen, cardiac monitor w/ pulse oximetry.      ED Course:         Labs reviewed by me, which appear mostly unremarkable.  Troponin is negative.        CT head shows  Impression   IMPRESSION:   No acute intracranial abnormality.         Electronically Signed     By: Suzen Dials M.D.     On: 12/01/2023 20:45         Case discussed with Dr. Consuelo, hospitalist, who accepts patient for admission to telemetry.  EKG    EKG Interpretation, as per my read:  Normal sinus rhythm, rate 84; Normal axis and intervals, LVH w/ abnormal repolarization; No acute ST changes, nonspecific T wave changes.    X-RAY (Interpreted by Dr. Aldean)   cXR shows no acute disease      Critical Care Time             Laboratory results reviewed by EDP:  Yes  Radiology results reviewed by EDP:  Yes  Radiologic Studies Interpreted by EDP:  Yes          Lauree JINNY Aldean, DO  12/01/2023, 8:05 PM        Aldean Lauree JINNY, DO  12/03/2023, 1:16 AM     ___________________________    Medications  (ED/RX)     Meds administered in ER this  visit:  Medications   morphine  injection 4 mg (4 mg Intravenous Given 12/01/23 2304)   acetaminophen  (TylenoL ) tablet 1,000 mg (1,000 mg Oral Given 12/02/23 0150)       New prescriptions given to patient:  Discharge Medication List as of 12/02/2023  2:38 AM             Labs     Results for orders placed or performed during the hospital encounter of 12/01/23   GLUCOSE SCREEN   Result Value Ref Range    Glucose POC 102 (H) 70 - 99 mg/dL   EKG 12 LEAD UNIT PERFORMED   Result Value Ref Range    Heart Rate 84 bpm    RR Interval 716 ms    Atrial Rate 84 ms    P-R Interval 170 ms    P Duration 123 ms    P Horizontal Axis 5 deg    P Front Axis 28 deg    Q Onset 503 ms    QRSD Interval 103 ms    QT Interval 346 ms    QTcB 409 ms    QTcF 387 ms    QRS Horizontal Axis 9 deg    QRS Axis 9 deg    I-40 Front Axis 26 deg    t-40 Horizontal Axis -6 deg    T-40 Front Axis 8 deg    T Horizontal Axis 200 deg    T Wave Axis 156 deg    S-T Horizontal Axis 193 deg    S-T Front Axis 207 deg    Impression - ABNORMAL ECG -     Impression SR-Sinus rhythm-normal P axis, V-rate 50-99     Impression       ET-Abnormal R-wave progression, early transition-QRS area>0 in V2    Impression       -Probable LVH with secondary repolarization abnormality, may represent   inferolateral wall ischemia, unchanged since 09/24/2023 and priors-     COMPREHENSIVE METABOLIC PANEL   Result Value Ref Range    Potassium 3.6 3.5 - 5.5 mmol/L    Sodium 140 133 - 145 mmol/L    Chloride 95 (L) 98 - 110 mmol/L    Glucose 94 70 - 99 mg/dL    Calcium  10.8 (H) 8.4 - 10.5 mg/dL    Albumin  4.4 3.5 - 5.0 g/dL    SGPT (ALT) 32 5 - 40 U/L    SGOT (AST) 24 10 - 37 U/L    Bilirubin Total 0.3 0.2 - 1.2 mg/dL    Alkaline Phosphatase 140 (H) 40 - 120 U/L    BUN 8 6 - 22 mg/dL    CO2 32  20 - 32 mmol/L    Creatinine 1.1 0.8 - 1.4 mg/dL    eGFR 46.6 (L) >39.9 mL/min/1.73 sq.m.    Globulin 3.7 2.0 - 4.0 g/dL    A/G Ratio 1.2 1.1 - 2.6 ratio    Total Protein 8.1 6.2 - 8.1 g/dL    Anion Gap 86.9  3.0 - 15.0 mmol/L   TROPONIN   Result Value Ref Range    Troponin (T) Quant High Sensitivity (5th Gen) 7 0 - 19 ng/L   CBC WITH DIFFERENTIAL AUTO   Result Value Ref Range    WBC 7.3 4.0 - 11.0 K/uL    RBC 5.01 3.80 - 5.20 M/uL    HGB 12.5 11.7 - 16.1 g/dL    HCT 60.2 64.8 - 51.6 %    MCV 79 (L) 80 - 99 fL    MCH 25 (L) 26 - 34 pg    MCHC 32 31 - 36 g/dL    RDW 84.8 89.9 - 84.4 %    Platelet 307 140 - 440 K/uL    MPV 9.4 9.0 - 13.0 fL    Segmented Neutrophils (Auto) 48 40 - 75 %    Lymphocytes (Auto) 40 20 - 45 %    Monocytes (Auto) 6 3 - 12 %    Eosinophils (Auto) 5 0 - 6 %    Basophils (Auto) 1 0 - 2 %    Absolute Neutrophils (Auto) 3.5 1.8 - 7.7 K/uL    Absolute Lymphocytes (Auto) 2.9 1.0 - 4.8 K/uL    Absolute Monocytes (Auto) 0.5 0.1 - 1.0 K/uL    Absolute Eosinophils (Auto) 0.3 0.0 - 0.5 K/uL    Absolute Basophils (Auto) 0.1 0.0 - 0.2 K/uL   EDIE VALUES   Result Value Ref Range    EDIE SECURITY      EDIE PDMP 1     EDIE CAREPLAN      EDIE FREQUENCY 1     EDIE FACILITY       *Note: Due to a large number of results and/or encounters for the requested time period, some results have not been displayed. A complete set of results can be found in Results Review.              Radiology     ED CT HEAD NO CONTRAST   Final Result   IMPRESSION:   No acute intracranial abnormality.         Electronically Signed     By: Suzen Dials M.D.     On: 12/01/2023 20:45         CHEST PORTABLE - Chest Pain   Final Result   IMPRESSION:   No active disease.         Electronically Signed     By: Suzen Dials M.D.     On: 12/01/2023 20:40         EKG 12 LEAD UNIT PERFORMED   Final Result                 Vital Signs this ED Visit     Patient Vitals for the past 24 hrs:   Temp Heart Rate Pulse Resp BP BP Mean SpO2   12/02/23 0216 98 F (36.7 C) 67 67 9 123/54 71 MM HG 98 %   12/02/23 0151 -- 75 75 10 126/80 90 MM HG 94 %              Patient History  Past Medical History:  Past Medical History:   Diagnosis Date   . Acute, but  ill-defined, cerebrovascular disease     TIA   . Arthropathy     knees   . Asthma    . Bipolar disorder (HCC)    . Cardiac arrhythmia     h/o Afib on Eliquis    . Cerebral artery occlusion with cerebral infarction [facility])     CVA post endodartorectomy   . Chest pain     has history multiple admissions for this, stress test and cardiac cath negative   . Chronic anemia     Hgb ~10   . Chronic obstructive pulmonary disease (HCC) 01/19/2017   . COPD (chronic obstructive pulmonary disease) (HCC)    . Cough    . COVID    . COVID-19 vaccine series completed    . CVA (cerebral vascular accident) (HCC) 09/01/2021   . Deficiency anemia 11/2020    rx with Iron Infusions   . Epilepsy (HCC)     per pt last Sz was approx 9-10 months ago   . Esophageal reflux    . Headache    . 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 Hemorrhoids    . History of blood transfusion     following one of her pregnancy   . History of cardiac catheterization no obstructive cad in 2017    . Hyperlipidemia    . Hyperlipidemia    . Hypertension    . Lower leg edema    . Migraines    . Paroxysmal atrial fibrillation (HCC)     on anticoagulation   . Seizure-like activity (HCC)     EEG negative   . Stable angina pectoris (HCC) 02/15/2018   . Stiff person syndrome    . Syncope     recurrent, has loop recorder   . Thoracic aortic aneurysm (HCC)    . TIA (transient ischemic attack)     multiple since age 89's per patient. Multiple MRI brain in the past negative for evidence of stroke. Suspected complicated migraine.   . Type 2 diabetes mellitus (HCC)     not on medication   . Unable to coordinate sucking, swallowing, and breathing 09/28/2019       Past Surgical History:   Past Surgical History:   Procedure Laterality Date   . BREAST BIOPSY Right 06/18/2021    right ultrasound core biopsy with clip placed   . CARDIAC CATH  2016, 02/2016   . CAROTID ENDARTERECTOMY     . COLONOSCOPY N/A 07/01/2013     Procedure: COLONOSCOPY;  Surgeon: Neysa Katz, MD;  Location: [facility] ENDO OR LOC;  Service: Endoscopy GI;  Laterality: N/A;   . COLONOSCOPY N/A 04/13/2015    Procedure: COLONOSCOPY FLEXIBLE;  Surgeon: Glendia Sherry, MD;  Location: [facility] ENDO OR LOC;  Service: Endoscopy GI;  Laterality: N/A;   . COLONOSCOPY WITH BIOPSY N/A 04/13/2015    Procedure: COLONOSCOPY FLEXIBLE WITH BIOPSY;  Surgeon: Glendia Sherry, MD;  Location: [facility] ENDO OR LOC;  Service: Endoscopy GI;  Laterality: N/A;   . COLONOSCOPY WITH BIOPSY N/A 01/31/2017    Procedure: COLONOSCOPY FLEXIBLE WITH BIOPSY;  Surgeon: Vicci Emery DEL, MD   . COLONOSCOPY WITH BIOPSY N/A 05/29/2017    Procedure: COLONOSCOPY, WITH BIOPSY;  Surgeon: Josepha Marinell BROCKS, MD   . COLONOSCOPY WITH BIOPSY N/A 05/29/2017    Procedure: COLONOSCOPY, WITH BIOPSY;  Surgeon: Josepha Marinell BROCKS, MD   .  EGD N/A 07/01/2013    Procedure: ESOPHAGOGASTRODUODENOSCOPY;  Surgeon: Neysa Katz, MD;  Location: [facility] ENDO OR LOC;  Service: Endoscopy GI;  Laterality: N/A;   . EGD N/A 08/08/2014    Procedure: ESOPHAGOGASTRODUODENOSCOPY FLEXIBLE TRANSORAL DIAGNOSTIC;  Surgeon: Glendia Copier, MD;  Location: [facility] ENDO OR LOC;  Service: Endoscopy GI;  Laterality: N/A;   . EGD WITH BIOPSY N/A 06/07/2016    Procedure: ESOPHAGOGASTRODUODENOSCOPY FLEXIBLE TRANSORAL WITH BIOPSY;  Surgeon: Vicci Emery DEL, MD   . EGD WITH BIOPSY N/A 03/20/2020    Procedure: EGD, WITH BIOPSY;  Surgeon: Josepha Marinell BROCKS, MD   . EGD WITH BLEEDING CONTROL N/A 05/29/2017    Procedure: EGD, WITH HEMORRHAGE CONTROL;  Surgeon: Josepha Marinell BROCKS, MD   . EXCISION OF BENIGN LESION Right 10/19/2021    Procedure: EXCISION, LESION, BENIGN, BREAST;  Surgeon: Clance Rosealee HERO, MD   . HEART CATHETERIZATION     . HYSTERECTOMY     . LOOP ELECTROSURGICAL EXCISON PROCEDURE     . LOOP RECORDER  2018   . OTHER Right 06/18/2021    axillary lymph node biopsy with clip placed   . OTHER  05/2021    Left leg ablation procedure   . VENOUS ACCESS  CATHETER INSERTION Right 03/29/2019       Family History:   Family History   Problem Relation Age of Onset   . Hypertension Mother    . Diabetes Mother    . Hypertension Sister    . Diabetes Brother    . Hypertension Brother    . Other Family History Father         Unknown to patient   . Anesthesia Reaction Neg Hx        Social History:   Social History     Socioeconomic History   . Marital status: Divorced     Spouse name: Not on file   . Number of children: Not on file   . Years of education: Not on file   . Highest education level: Not on file   Occupational History   . Not on file   Tobacco Use   . Smoking status: Never     Passive exposure: Never   . Smokeless tobacco: Never   Vaping Use   . Vaping status: Never Used   Substance and Sexual Activity   . Alcohol use: No     Alcohol/week: 0.0 standard drinks of alcohol   . Drug use: No   . Sexual activity: Not Currently   Other Topics Concern   . Back Care Not Asked   . Bike Helmet Not Asked   . Blood Transfusions Not Asked   . Caffeine  Concern Not Asked   . Counseling Not Asked   . Depression Concerns Not Asked   . Depression Screening Not Asked   . Exercise Not Asked   . Hobby Hazards Not Asked   . Military Service Not Asked   . Occupational Exposure Not Asked   . Seat Belt Not Asked   . Self-Exams Not Asked   . Sleep Concern Not Asked   . Smoke Detectors Not Asked   . Smoking Concerns Not Asked   . Smoking Cessation Not Asked   . Special Diet Not Asked   . Stress Concern Not Asked   . Weight Concern Not Asked   . Do you use a bike helmet? Not Asked   . Do you use caffeine  daily? Not Asked   . Do you exercise? Not  Asked   . Are there hazards related to your hobbies? Not Asked   . Military Service Not Asked   . Do you have any work place hazards? Not Asked   . Do you wear a seat belt while in a moving vehicle? Not Asked   . Do you have sleep concerns? Not Asked   . Do you follow a special diet? Not Asked   . Are you concerned with your weight? Not Asked   Social  History Narrative    DNR/DNI    MPOA - her daughter Warrick Silvius     Social Drivers of Health     Financial Resource Strain: Low Risk  (07/28/2023)    Overall Financial Resource Strain (CARDIA)    . Difficulty of Paying Living Expenses: Not hard at all   Food Insecurity: No Food Insecurity (07/28/2023)    Hunger Vital Sign    . Worried About Programme Researcher, Broadcasting/film/video in the Last Year: Never true    . Ran Out of Food in the Last Year: Never true   Transportation Needs: No Transportation Needs (07/28/2023)    PRAPARE - Transportation    . Lack of Transportation (Medical): No    . Lack of Transportation (Non-Medical): No   Physical Activity: Insufficiently Active (07/28/2023)    Exercise Vital Sign    . Days of Exercise per Week: 2 days    . Minutes of Exercise per Session: 10 min   Stress: Stress Concern Present (07/28/2023)    [employer] of Occupational Health - Occupational Stress Questionnaire    . Feeling of Stress : To some extent   Social Connections: Moderately Integrated (07/28/2023)    Social Connection and Isolation Panel [NHANES]    . Frequency of Communication with Friends and Family: More than three times a week    . Frequency of Social Gatherings with Friends and Family: Once a week    . Attends Religious Services: More than 4 times per year    . Active Member of Clubs or Organizations: Yes    . Attends Banker Meetings: More than 4 times per year    . Marital Status: Divorced   Catering Manager Violence: Not At Risk (07/28/2023)    Humiliation, Afraid, Rape, and Kick questionnaire    . Fear of Current or Ex-Partner: No    . Emotionally Abused: No    . Physically Abused: No    . Sexually Abused: No   Housing Stability: Low Risk  (07/28/2023)    Housing Stability Vital Sign    . Unable to Pay for Housing in the Last Year: No    . Number of Times Moved in the Last Year: 0    . Homeless in the Last Year: No       Home Medications:   Home Medication List - Marked as Reviewed on 12/01/23 2005   Medication  Sig   Ammonium Lactate 12 % Top CREA PLEASE SEE ATTACHED FOR DETAILED DIRECTIONS   apixaban  (ELIQUIS ) 5 mg PO TABS Take 1 Tab by Mouth Twice Daily.   dapagliflozin propanediol (FARXIGA) 10 mg PO TABS Take 1 Tab by Mouth Once a Day.   diazePAM  (VALIUM ) 5 mg PO TABS Take 1 Tab by Mouth 2 (two) times a day.   EPINEPHrine (EPIPEN) 0.3 mg/0.3 mL Inj AtIn Inject 0.3 mL into the muscle Take As Needed for Other (ANAPHYLAXIS). Q 10-30 minutes prn for ANAPHYLAXIS ONLY   gabapentin  (NEURONTIN ) 100 mg PO CAPS  Take 1 Cap by Mouth 3 Times Daily.   lamoTRIgine  (LAMICTAL ) 100 mg PO TABS Take 1 Tab by Mouth Once a Day. Indications: epilepsy   lurasidone  (LATUDA ) 40 mg PO TABS TAKE 1 TABLET DAILY WITH FOOD (AT LEAST 350 CALORIES)   lurasidone  120 mg PO TABS Take 1 Tab by Mouth Once a Day.   MAGNESIUM  GLYCINATE PO Take 100 mg by Mouth Once a Day. Indications: patient states she takes this over the counter   metoprolol  XL (TOPROL  XL) 25 mg PO TB24 Take 1 Tab by Mouth Once a Day.   Mycophenolate  Sodium 360 mg PO TBEC Take 4 Tabs by Mouth Twice Daily.   pantoprazole  (PROTONIX ) 40 mg PO TBEC TAKE 1 TABLET BY MOUTH TWICE A DAY   potassium chloride  ER (K-DUR;KLOR-CON  M) 10 mEq PO tablet,extended release (part/cryst) Take 1 Tab by Mouth 3 times a day.   promethazine  (PHENERGAN ) 25 mg PO TABS Take 1 Tab by Mouth Take As Needed.   QUEtiapine  SR (SEROQUEL  XR) 50 mg PO TB24 Take 1 Tab by Mouth Every 24 Hours. Indications: manic-depression   rOPINIRole  (REQUIP ) 0.5 mg PO TABS TAKE 2 TABS BY MOUTH EVERY NIGHT AT BEDTIME.   torsemide (DEMADEX) 20 mg PO TABS Take 1 Tab by Mouth Once a Day.   traMADoL  (ULTRAM ) 50 mg PO TABS Take 1 Tab by Mouth 2 Times Daily As Needed. for pain   TYPE-IN DME 4 PRONG CANE - Dx: G95.89, R26.9       Allergies:   Allergies   Allergen Reactions   . Montelukast rash/itching and swelling   . Nitroglycerin hives and rash/itching   . Immune Globulin  (Human) (Igg) swelling and rash/itching     Pt complain of itchiness and  swelling of lips and tongue. Saturation fine, bp elevated.   . Magnesium  Sulfate rash/itching     Can take magnesium  supplement   . Amoxicillin hives     Has tolerated ceftriaxone, cephalexin   . Atorvastatin  unknown and other/intolerance     Rhabdomyolysis   Pt states she thinks it caused her to have rhabdo   . Bumex [Bumetanide] muscle/joint pain   . Ceftriaxone rash/itching   . Magnesium  Chloride rash/itching   . Sulfamethoxazole-Trimethoprim rash/itching   . Iodinated Contrast Media swelling     Throat swelling and lip itching   . Penicillins hives     Has tolerated cephalexin   . Tetracyclines hives   . Latex, Natural Rubber rash/itching and hives   . Kit Prep Of Tc-38m-Tetrofosmin unknown

## 2023-12-02 NOTE — Consults (Signed)
 [address] [place], [place] [address]    [phone]  Office Phone   [phone]       Office Fax                                      [email or web address]                                                                                  Impression:     Physical Examination  - Neurological: No focal weakness noted.    Laboratory Imaging and Diagnostic Test Results  - Comprehensive blood workup: Performed, specific results not provided  - MRIs: Performed, specific results not provided  - Autoimmune paraneoplastic CSF panel: Normal  - CSF analysis: Zero oligoclonal bands  - CT scan of the head (December 01, 2023): Unremarkable, no acute pathology  - Complete Blood Count: Microcytic anemia, MCV 79      Seizure  Continue lamotrigine  100 mg daily.  Schedule an early follow-up with a neuroimmunologist.    Autoimmune Myelitis  Maintain the current immunosuppressive therapy with mycophenolate  1440 mg twice daily.  Arrange for an early follow-up with a neuroimmunologist.    Atrial Fibrillation  Continue the current regimen of toprol  25 mg daily.  Keep on with Eliquis  5 mg daily.    Bipolar Disorder  Persist with the current medication regimen for bipolar disorder, including Seroquel  and lurasidone , with doses to remain as prescribed.    Restless Leg Syndrome  Continue the prescribed treatment for restless leg syndrome, including Gabapentin  , and ropinirole , with doses to remain as prescribed.    Microcytic Anemia  Monitor and manage the patient's microcytic anemia, with an MCV of 79.    Additional Considerations  Monitor the patient's coronary artery disease and COPD as part of her comprehensive care plan, acknowledging these conditions in the context of her overall health management.      Thank you for allowing me to participate in your patient's care. Should you have questions, please feel free to call me.      Chief Complaint:  ? R facial droop   History of Present Illness:    Chief Complaint:  68 year old  female with a history of coronary artery disease, atrial fibrillation, COPD, epilepsy, and autoimmune myelitis presents to the hospital with seizure and possible mild left-sided facial droop.    History of Present Illness:  The patient has a history of paroxysmal atrial fibrillation and is currently on Toprol  25 mg daily and Eliquis  5 mg daily for management. She was diagnosed with bottomomyelitis at [facility], where she underwent a comprehensive blood workup and MRIs. Her autoimmune paraneoplastic CSF panel returned normal results, and she had zero oligoclonal bands in her CSF. For her autoimmune condition, she has been prescribed mycophenolate  1440 mg twice daily by a neuroimmunologist. Additionally, she continues to take lamotrigine  100 mg daily for epilepsy. The patient also has a history of bipolar disorder, for which she is on Seroquel  and lurasidone . Restless leg syndrome is another condition she manages, with a regimen including Gabapentin  and ropinirole . Notably, the  patient has microcytic anemia with an MCV of 79 and heart failure with preserved ejection fraction. An early follow-up with her neuroimmunologist is recommended.        Past Medical History:   Diagnosis Date   . Acute, but ill-defined, cerebrovascular disease     TIA   . Arthropathy     knees   . Asthma    . Bipolar disorder (HCC)    . Cardiac arrhythmia     h/o Afib on Eliquis    . Cerebral artery occlusion with cerebral infarction [facility])     CVA post endodartorectomy   . Chest pain     has history multiple admissions for this, stress test and cardiac cath negative   . Chronic anemia     Hgb ~10   . Chronic obstructive pulmonary disease (HCC) 01/19/2017   . COPD (chronic obstructive pulmonary disease) (HCC)    . Cough    . COVID    . COVID-19 vaccine series completed    . CVA (cerebral vascular accident) (HCC) 09/01/2021   . Deficiency anemia 11/2020    rx with Iron Infusions   . Epilepsy (HCC)     per pt last Sz was approx  9-10 months ago   . Esophageal reflux    . Headache    . 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 Hemorrhoids    . History of blood transfusion     following one of her pregnancy   . History of cardiac catheterization no obstructive cad in 2017    . Hyperlipidemia    . Hyperlipidemia    . Hypertension    . Lower leg edema    . Migraines    . Paroxysmal atrial fibrillation (HCC)     on anticoagulation   . Seizure-like activity (HCC)     EEG negative   . Stable angina pectoris (HCC) 02/15/2018   . Stiff person syndrome    . Syncope     recurrent, has loop recorder   . Thoracic aortic aneurysm (HCC)    . TIA (transient ischemic attack)     multiple since age 52's per patient. Multiple MRI brain in the past negative for evidence of stroke. Suspected complicated migraine.   . Type 2 diabetes mellitus (HCC)     not on medication   . Unable to coordinate sucking, swallowing, and breathing 09/28/2019      Past Surgical History:   Procedure Laterality Date   . BREAST BIOPSY Right 06/18/2021    right ultrasound core biopsy with clip placed   . CARDIAC CATH  2016, 02/2016   . CAROTID ENDARTERECTOMY     . COLONOSCOPY N/A 07/01/2013    Procedure: COLONOSCOPY;  Surgeon: Neysa Katz, MD;  Location: [facility] ENDO OR LOC;  Service: Endoscopy GI;  Laterality: N/A;   . COLONOSCOPY N/A 04/13/2015    Procedure: COLONOSCOPY FLEXIBLE;  Surgeon: Glendia Sherry, MD;  Location: [facility] ENDO OR LOC;  Service: Endoscopy GI;  Laterality: N/A;   . COLONOSCOPY WITH BIOPSY N/A 04/13/2015    Procedure: COLONOSCOPY FLEXIBLE WITH BIOPSY;  Surgeon: Glendia Sherry, MD;  Location: [facility] ENDO OR LOC;  Service: Endoscopy GI;  Laterality: N/A;   . COLONOSCOPY WITH BIOPSY N/A 01/31/2017    Procedure: COLONOSCOPY FLEXIBLE WITH BIOPSY;  Surgeon: Vicci Emery DEL, MD   . COLONOSCOPY WITH BIOPSY N/A 05/29/2017    Procedure: COLONOSCOPY, WITH BIOPSY;  Surgeon: Josepha Marinell BROCKS, MD   .  COLONOSCOPY WITH BIOPSY N/A 05/29/2017    Procedure: COLONOSCOPY, WITH BIOPSY;  Surgeon: Josepha Marinell BROCKS, MD   . EGD N/A 07/01/2013    Procedure: ESOPHAGOGASTRODUODENOSCOPY;  Surgeon: Neysa Katz, MD;  Location: [facility] ENDO OR LOC;  Service: Endoscopy GI;  Laterality: N/A;   . EGD N/A 08/08/2014    Procedure: ESOPHAGOGASTRODUODENOSCOPY FLEXIBLE TRANSORAL DIAGNOSTIC;  Surgeon: Glendia Copier, MD;  Location: [facility] ENDO OR LOC;  Service: Endoscopy GI;  Laterality: N/A;   . EGD WITH BIOPSY N/A 06/07/2016    Procedure: ESOPHAGOGASTRODUODENOSCOPY FLEXIBLE TRANSORAL WITH BIOPSY;  Surgeon: Vicci Emery DEL, MD   . EGD WITH BIOPSY N/A 03/20/2020    Procedure: EGD, WITH BIOPSY;  Surgeon: Josepha Marinell BROCKS, MD   . EGD WITH BLEEDING CONTROL N/A 05/29/2017    Procedure: EGD, WITH HEMORRHAGE CONTROL;  Surgeon: Josepha Marinell BROCKS, MD   . EXCISION OF BENIGN LESION Right 10/19/2021    Procedure: EXCISION, LESION, BENIGN, BREAST;  Surgeon: Clance Rosealee HERO, MD   . HEART CATHETERIZATION     . HYSTERECTOMY     . LOOP ELECTROSURGICAL EXCISON PROCEDURE     . LOOP RECORDER  2018   . OTHER Right 06/18/2021    axillary lymph node biopsy with clip placed   . OTHER  05/2021    Left leg ablation procedure   . VENOUS ACCESS CATHETER INSERTION Right 03/29/2019      acetaminophen  (TylenoL ) tablet 650 mg  apixaban  (Eliquis ) tablet 5 mg  atropine  injection 1 mg  [Held by provider] dapagliflozin propanediol (Farxiga) tablet 10 mg  gabapentin  (Neurontin ) capsule 100 mg  lamoTRIgine  (LaMICtal ) tablet 100 mg  LORazepam  (Ativan ) injection 1 mg  lurasidone  (Latuda ) tablet 120 mg  melatonin tablet 3 mg  metoprolol  XL (Toprol  XL) tablet 25 mg  morphine  injection 2 mg   Or  morphine  injection 3 mg   Or  morphine  injection 4 mg  mycophenolate  EC (Myfortic ) tablet 1,440 mg  NS Flush (NaCl) injection 10 mL  NS Flush (NaCl) injection 10 mL  omeprazole  (PriLOSEC) capsule 40 mg  ondansetron  (Zofran ) tablet 4 mg   Or  ondansetron  (PF) (Zofran ) injection 4 mg  polyethylene  glycol (Miralax ) packet 17 g  QUEtiapine  SR (SEROquel  XR) tablet 50 mg  rOPINIRole  (Requip ) tablet 1 mg  torsemide (Demadex) tablet 20 mg  [Held by provider] traMADoL  (Ultram ) tablet 50 mg      Allergies   Allergen Reactions   . Montelukast rash/itching and swelling   . Nitroglycerin hives and rash/itching   . Immune Globulin  (Human) (Igg) swelling and rash/itching     Pt complain of itchiness and swelling of lips and tongue. Saturation fine, bp elevated.   . Magnesium  Sulfate rash/itching     Can take magnesium  supplement   . Amoxicillin hives     Has tolerated ceftriaxone, cephalexin   . Atorvastatin  unknown and other/intolerance     Rhabdomyolysis   Pt states she thinks it caused her to have rhabdo   . Bumex [Bumetanide] muscle/joint pain   . Ceftriaxone rash/itching   . Magnesium  Chloride rash/itching   . Sulfamethoxazole-Trimethoprim rash/itching   . Iodinated Contrast Media swelling     Throat swelling and lip itching   . Penicillins hives     Has tolerated cephalexin   . Tetracyclines hives   . Latex, Natural Rubber rash/itching and hives   . Kit Prep Of Tc-53m-Tetrofosmin unknown      Family History   Problem Relation Age of Onset   . Hypertension Mother    .  Diabetes Mother    . Hypertension Sister    . Diabetes Brother    . Hypertension Brother    . Other Family History Father         Unknown to patient   . Anesthesia Reaction Neg Hx       Social History     Socioeconomic History   . Marital status: Divorced     Spouse name: Not on file   . Number of children: Not on file   . Years of education: Not on file   . Highest education level: Not on file   Occupational History   . Not on file   Tobacco Use   . Smoking status: Never     Passive exposure: Never   . Smokeless tobacco: Never   Vaping Use   . Vaping status: Never Used   Substance and Sexual Activity   . Alcohol use: No     Alcohol/week: 0.0 standard drinks of alcohol   . Drug use: No   . Sexual activity: Not Currently   Other Topics Concern   . Back  Care Not Asked   . Bike Helmet Not Asked   . Blood Transfusions Not Asked   . Caffeine  Concern Not Asked   . Counseling Not Asked   . Depression Concerns Not Asked   . Depression Screening Not Asked   . Exercise Not Asked   . Hobby Hazards Not Asked   . Military Service Not Asked   . Occupational Exposure Not Asked   . Seat Belt Not Asked   . Self-Exams Not Asked   . Sleep Concern Not Asked   . Smoke Detectors Not Asked   . Smoking Concerns Not Asked   . Smoking Cessation Not Asked   . Special Diet Not Asked   . Stress Concern Not Asked   . Weight Concern Not Asked   . Do you use a bike helmet? Not Asked   . Do you use caffeine  daily? Not Asked   . Do you exercise? Not Asked   . Are there hazards related to your hobbies? Not Asked   . Military Service Not Asked   . Do you have any work place hazards? Not Asked   . Do you wear a seat belt while in a moving vehicle? Not Asked   . Do you have sleep concerns? Not Asked   . Do you follow a special diet? Not Asked   . Are you concerned with your weight? Not Asked   Social History Narrative    DNR/DNI    MPOA - her daughter Warrick Silvius     Social Drivers of Health     Financial Resource Strain: Low Risk  (07/28/2023)    Overall Financial Resource Strain (CARDIA)    . Difficulty of Paying Living Expenses: Not hard at all   Food Insecurity: No Food Insecurity (07/28/2023)    Hunger Vital Sign    . Worried About Programme Researcher, Broadcasting/film/video in the Last Year: Never true    . Ran Out of Food in the Last Year: Never true   Transportation Needs: No Transportation Needs (07/28/2023)    PRAPARE - Transportation    . Lack of Transportation (Medical): No    . Lack of Transportation (Non-Medical): No   Physical Activity: Insufficiently Active (07/28/2023)    Exercise Vital Sign    . Days of Exercise per Week: 2 days    . Minutes of Exercise per Session: 10  min   Stress: Stress Concern Present (07/28/2023)    [employer] of Occupational Health - Occupational Stress Questionnaire    . Feeling of  Stress : To some extent   Social Connections: Moderately Integrated (07/28/2023)    Social Connection and Isolation Panel [NHANES]    . Frequency of Communication with Friends and Family: More than three times a week    . Frequency of Social Gatherings with Friends and Family: Once a week    . Attends Religious Services: More than 4 times per year    . Active Member of Clubs or Organizations: Yes    . Attends Banker Meetings: More than 4 times per year    . Marital Status: Divorced   Catering Manager Violence: Not At Risk (07/28/2023)    Humiliation, Afraid, Rape, and Kick questionnaire    . Fear of Current or Ex-Partner: No    . Emotionally Abused: No    . Physically Abused: No    . Sexually Abused: No   Housing Stability: Low Risk  (07/28/2023)    Housing Stability Vital Sign    . Unable to Pay for Housing in the Last Year: No    . Number of Times Moved in the Last Year: 0    . Homeless in the Last Year: No       Review Of Systems:  Positives are bolded  NEURO: headache, slurred speech, imbalance of gait, falls, abnormal movements, tremor, memory loss, numbness/tingling, weakness, cramps/spasms.  CONSTITUTIONAL:   fever, chills, night sweats, excess fatigue, weight gain/ weight loss, difficulty falling asleep, difficulty staying asleep, vivid dreams  EYES/EARS:  visual loss, blurred vision, eye pain, double vision, hearing loss, ringing in ears.  HEENT:  Nasal congestion, sinus pain, nose bleeds, neck stiffness, swollen lymph nodes  RESP:  Shortness of breath, dry cough, productive cough.  CARDIO:  Chest pain, palpitations, passing out, heart murmur  VASC: swollen legs, easy brusing or bleeding, recent transfusions  GASTRO:  Swallowing difficulty, constipation, diarrhea  GENITOUR: frequency,incontinence, urgency, incomplete bladder emptying.  MUSC: joint pain, muscle aches  BEHAV:  depression, anxiety, high levels of stress, hallucinations, inappropriate laughing or crying  All other systems were  reviewed and are unremarkable    Exam:  Visit Vitals  BP 111/57   Pulse 75   Temp 98.3 F (36.8 C)   Resp 18   Wt 91.8 kg (202 lb 6.1 oz)   SpO2 95%   BMI 33.68 kg/m     Well developed, well nourished female  Head and Face: Normocephalic, no mass or lesions  Ears, Nose, Throat - External ears normal, normal nares, Mucosa pink, posterior    pharynx clear  Neck: Supple, no bruits  Respiratory: Clear to auscultation, chest symmetric  Cardiac: Regular rate and rhythm  Abdomen: soft, NT  Extremities: No edema  Musculoskeletal: No joint deformities  Psychiatric: Normal affect    Neurologic:  Mental status:   Awake, alert and oriented to time, place and person   Normal recent and remote memory   Normal attention and concentration   Normal speech and language   Normal fund of knowledge  Cranial nerves:   Normal funduscopic   II: Pupils equal, round and reactive to light and accomodation   III, IV, VI: Extra-ocular eye movements full without nystagmus   Visual fields full   V: Intact facial sensation V1, V2, V3   VII:I Normal facial symmetry and movement   VIII: Intact hearing bilaterally  IX/X: Palate elevates symmetrically, uvula midline   XI: Normal SCM and trapezius strength   XII: Tongue midline  Motor:   No pronator drift   Normal fine finger movements bilaterally   Normal muscle tone, bulk and power   No abnormal movements  Sensory   Intact to light touch, pinprick, position and vibration  MSRs   2+ and symmetric   Plantar responses are flexor bilaterally  Coordination   Intact to finger-nose finger and heel to shin  Gait   On bed       Data Reviewed:  Results for orders placed or performed during the hospital encounter of 12/02/23 (from the past 24 hours)   BASIC METABOLIC PANEL   Result Value Ref Range    Potassium 3.5 3.5 - 5.5 mmol/L    Sodium 139 133 - 145 mmol/L    Chloride 97 (L) 98 - 110 mmol/L    Glucose 111 (H) 70 - 99 mg/dL    Calcium  10.2 8.4 - 10.5 mg/dL    BUN 9 6 - 22 mg/dL    Creatinine 1.3 0.8 -  1.4 mg/dL    CO2 29 20 - 32 mmol/L    eGFR 44.2 (L) >60.0 mL/min/1.73 sq.m.    Anion Gap 13.0 3.0 - 15.0 mmol/L   Thyroid  Cascade   Result Value Ref Range    TSH 2.20 0.27 - 4.20 mcU/mL   Lipid Complete Panel now   Result Value Ref Range    Cholesterol 246 (H) 110 - 200 mg/dL    Triglyceride 865 40 - 149 mg/dL    HDL 57 >=59 mg/dL    Cholesterol/HDL 4.3 0.0 - 5.0    Non-HDL Cholesterol 189 (H) <130 mg/dL    LDL CALCULATION 837 (H) 50 - 99 mg/dL    VLDL CALCULATION 27 8 - 30 mg/dL    LDL/HDL Ratio 2.8    Hemoglobin A1C tomorrow am   Result Value Ref Range    Hemoglobin A1c 6.1 (H) 4.8 - 5.6 %    Estimated Average Glucose 128 (H) 91 - 123 mg/dL   CBC WITH DIFFERENTIAL AUTO   Result Value Ref Range    WBC 7.3 4.0 - 11.0 K/uL    RBC 4.40 3.80 - 5.20 M/uL    HGB 11.3 (L) 11.7 - 16.1 g/dL    HCT 64.7 64.8 - 51.6 %    MCV 80 80 - 99 fL    MCH 26 26 - 34 pg    MCHC 32 31 - 36 g/dL    RDW 85.1 89.9 - 84.4 %    Platelet 303 140 - 440 K/uL    MPV 9.8 9.0 - 13.0 fL    Segmented Neutrophils (Auto) 43 40 - 75 %    Lymphocytes (Auto) 44 20 - 45 %    Monocytes (Auto) 8 3 - 12 %    Eosinophils (Auto) 5 0 - 6 %    Basophils (Auto) 1 0 - 2 %    Absolute Neutrophils (Auto) 3.1 1.8 - 7.7 K/uL    Absolute Lymphocytes (Auto) 3.2 1.0 - 4.8 K/uL    Absolute Monocytes (Auto) 0.6 0.1 - 1.0 K/uL    Absolute Eosinophils (Auto) 0.4 0.0 - 0.5 K/uL    Absolute Basophils (Auto) 0.0 0.0 - 0.2 K/uL   Troponin   Result Value Ref Range    Troponin (T) Quant High Sensitivity (5th Gen) 7 0 - 19 ng/L   D-Dimer now   Result Value Ref  Range    D-Dimer <0.19 0.00 - 1.12 mg/L FEU   GLUCOSE SCREEN   Result Value Ref Range    Glucose POC 116 (H) 70 - 99 mg/dL       [facility] Imaging: personally reviewed by me.     Emery Loach, MD      Cc: [name], MD

## 2023-12-02 NOTE — Discharge Summary (Addendum)
 [facility]  [facility]       Discharge Summary   /9/25    Admission Date: 12/02/2023   Discharge Date: 12/03/2023    Were Interpreter Services Utilized?  No     Final Discharge Diagnosis:  #Left facial droop present admission, now resolved, likely TIA, workup nonconclusive including brain MRI,  History of CVA with cerebral artery occlusion,  #Chest pain likely noncardiac in nature, workup nonconclusive, advised to follow-up with PCP for outpatient stress test, presently asymptomatic,  #Chronic bilateral lower extremity edema chronic in nature,  #Chronic diastolic congestive heart failure stable,  #Autoimmune myelitis, further follow-up with neurology as an outpatient  # COPD stable,  #Seizure disorder stable,  #Bipolar disorder stable,  #Restless leg syndrome stable,  #GERD without esophagitis  # Diabetes mellitus 2 without hyperglycemia  #Chronic anemia stable         Admission Diagnosis:  No diagnosis found.      Hospital course:  68 y.o. 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  He was pressure-like nonradiating that has not subsided.  Patient states that she had also noticed left facial droop on the morning on 11/30/2023.  She voiced mild headache.  Denied any shortness of breath, palpitations diaphoresis dizziness,  Or any speech difficulties or any other focal neurological deficits. At [facility] afebrile heart rate 93 blood pressure 146/71.  Labs notable for CBC with differential MCV 79 hemoglobin 12.5 CMP potassium 3.6 creatinine 1.1 initial troponin 7.  EKG stable  compared to prior no acute changes.  CT head negative CXR no acute process.  Patient admitted for stroke evaluation, head CT with no acute pathology noted, brain MRI with no evidence of acute CVA noted,  Patient has had episode of chest discomfort ongoing for last couple of days, resolved on its own, workup including serial cardiac markers within normal range, present asymptomatic, EKG reviewed with no acute changes noted, patient had an echocardiogram last year in September with normal ejection fraction, patient advised to follow-up with the PCP for stress test as an outpatient, also advised to follow-up with PCP and/neurology within a week postdischarge,             Dear [name], MD   [name] is advised to follow up with you within 1-2 weeks.     Follow-up with: Medicine     Scheduled appointments:  Follow Up Info (next 45 days)       Follow up with Pouch Waynard, MD in 1 week    Specialty: Family Practice    Phone: [phone]    Where: [facility]      Thursday Dec 21, 2023    My [facility] Care Coordinator Outreach Visit with Delores Aubry HERO, RN at 11:45 AM    Where: [facility] ([facility]      Friday Dec 22, 2023    3D Screening Mammogram with AICT MAMMO RM 2 at 11:30 AM    Where: [facility])      [facility]  [address]  [place] [address]  [phone]  [facility]  [facility]  [address]  [name] [address]  [phone]                 Discharge Medication List        CHANGE how you take these medications      lurasidone  40 mg Tabs  TAKE 1 TABLET DAILY WITH FOOD (AT LEAST 350 CALORIES)  Refills: 0  Commonly known as: Latuda   What changed: Another medication with the same name was removed. Continue taking this medication, and follow the directions you see here.      torsemide 20 mg Tabs  Take 1 Tab by Mouth Once a  Day.  Dispense: 30 Tab  Refills: 0  Commonly known as: Demadex             CONTINUE taking these medications      Ammonium Lactate 12 % Crea  PLEASE SEE ATTACHED FOR DETAILED DIRECTIONS  Refills: 0      apixaban  5 mg Tabs  Take 1 Tab by Mouth Twice Daily.  Refills: 0  Commonly known as: Eliquis       dapagliflozin propanediol 10 mg Tabs  Take 1 Tab by Mouth Once a Day.  Dispense: 30 Tab  Refills: 0  Commonly known as: Farxiga      diazePAM  5 mg Tabs  Take 1 Tab by Mouth 2 (two) times a day.  Refills: 0  Commonly known as: Valium       EPINEPHrine 0.3 mg/0.3 mL Atin  Inject 0.3 mL into the muscle Take As Needed for Other (ANAPHYLAXIS). Q 10-30 minutes prn for ANAPHYLAXIS ONLY  Dispense: 2 Each  Refills: 1  Commonly known as: EpiPen      gabapentin  100 mg Caps  Take 1 Cap by Mouth 3 Times Daily.  Refills: 0  Commonly known as: Neurontin       lamoTRIgine  100 mg Tabs  Take 1 Tab by Mouth Once a Day. Indications: epilepsy  Refills: 0  Commonly known as: LaMICtal       MAGNESIUM  GLYCINATE PO  Take 100 mg by Mouth Once a Day. Indications: patient states she takes this over the counter  Refills: 0      metoprolol  XL 25 mg Tb24  Take 1 Tab by Mouth Once a Day.  Refills: 0  Commonly known as: Toprol  XL      Mycophenolate  Sodium 360 mg Tbec  Take 4 Tabs by Mouth Twice Daily.  Refills: 0      pantoprazole  40 mg Tbec  TAKE 1 TABLET BY MOUTH TWICE A DAY  Dispense: 180 Tab  Refills: 1  Commonly known as: Protonix       potassium chloride  ER 10 mEq tablet,extended release (part/cryst)  Take 1 Tab by Mouth 3 times a day.  Refills: 0  Commonly known as: K-Dur;Klor-Con  M      promethazine  25 mg Tabs  Take 1 Tab by Mouth Take As Needed.  Refills: 0  Commonly known as: Phenergan       QUEtiapine  SR 50 mg Tb24  Take 1 Tab by Mouth Every 24 Hours. Indications: manic-depression  Refills: 0  Commonly known as: SEROquel  XR      rOPINIRole  0.5 mg Tabs  TAKE 2 TABS BY MOUTH EVERY NIGHT AT BEDTIME.  Dispense: 180 Tab  Refills: 1  Commonly known as:  Requip       traMADoL  50 mg Tabs  Take 1 Tab by Mouth 2 Times Daily As Needed. for pain  Dispense: 60 Tab  Refills: 0  Commonly known as: Ultram       TYPE-IN DME  4 PRONG CANE - Dx: G95.89, R26.9  Dispense: 1 Each  Refills: 0                 Allergies   Allergen Reactions   . Montelukast rash/itching and swelling   . Nitroglycerin hives and rash/itching   . Immune Globulin  (Human) (Igg) swelling and rash/itching     Pt complain of itchiness and swelling of lips and tongue. Saturation fine, bp elevated.   . Magnesium  Sulfate rash/itching     Can take magnesium  supplement   . Amoxicillin hives     Has tolerated ceftriaxone, cephalexin   . Atorvastatin  unknown and other/intolerance     Rhabdomyolysis   Pt states she thinks it caused her to have rhabdo   . Bumex [Bumetanide] muscle/joint pain   . Ceftriaxone rash/itching   . Magnesium  Chloride rash/itching   . Sulfamethoxazole-Trimethoprim rash/itching   . Iodinated Contrast Media swelling     Throat swelling and lip itching   . Penicillins hives     Has tolerated cephalexin   . Tetracyclines hives   . Latex, Natural Rubber rash/itching and hives   . Kit Prep Of Tc-67m-Tetrofosmin unknown         Disposition: Home     Discharge Condition: Fair    Diet at the time of discharge: Diabetic Diet     Discharge Exam:   General alert and oriented x3 , Cardiology normal rate regular rhythm  , and Lungs: clear to ausulatation, no wheeze, rales or rhonchi, systemic air entry      Total time for discharge - review of data, exam, discussion with providers and care-team, med-rec and orders, arranging follow up, counseling of patient and/or family and documentation was 81              Code Status Information       Code Status    FULL                Recent Results (from the past 72 hours)   GLUCOSE SCREEN    Collection Time: 12/01/23  8:01 PM   Result Value Ref Range    Glucose POC 102 (H) 70 - 99 mg/dL   COMPREHENSIVE METABOLIC PANEL    Collection Time: 12/01/23  9:45 PM   Result Value  Ref Range    Potassium 3.6 3.5 - 5.5 mmol/L    Sodium 140 133 - 145 mmol/L    Chloride 95 (L) 98 - 110 mmol/L    Glucose 94 70 - 99 mg/dL    Calcium  10.8 (H) 8.4 - 10.5 mg/dL    Albumin  4.4 3.5 - 5.0 g/dL    SGPT (ALT) 32 5 - 40 U/L    SGOT (AST) 24 10 - 37 U/L    Bilirubin Total 0.3 0.2 - 1.2 mg/dL    Alkaline Phosphatase 140 (H) 40 - 120 U/L    BUN 8 6 - 22 mg/dL    CO2 32 20 - 32 mmol/L    Creatinine 1.1 0.8 - 1.4 mg/dL    eGFR 46.6 (L) >39.9 mL/min/1.73 sq.m.    Globulin 3.7 2.0 - 4.0 g/dL    A/G Ratio 1.2 1.1 - 2.6 ratio    Total Protein 8.1 6.2 - 8.1 g/dL    Anion Gap 86.9 3.0 - 15.0 mmol/L   TROPONIN    Collection Time: 12/01/23  9:45  PM   Result Value Ref Range    Troponin (T) Quant High Sensitivity (5th Gen) 7 0 - 19 ng/L   CBC WITH DIFFERENTIAL AUTO    Collection Time: 12/01/23  9:45 PM   Result Value Ref Range    WBC 7.3 4.0 - 11.0 K/uL    RBC 5.01 3.80 - 5.20 M/uL    HGB 12.5 11.7 - 16.1 g/dL    HCT 60.2 64.8 - 51.6 %    MCV 79 (L) 80 - 99 fL    MCH 25 (L) 26 - 34 pg    MCHC 32 31 - 36 g/dL    RDW 84.8 89.9 - 84.4 %    Platelet 307 140 - 440 K/uL    MPV 9.4 9.0 - 13.0 fL    Segmented Neutrophils (Auto) 48 40 - 75 %    Lymphocytes (Auto) 40 20 - 45 %    Monocytes (Auto) 6 3 - 12 %    Eosinophils (Auto) 5 0 - 6 %    Basophils (Auto) 1 0 - 2 %    Absolute Neutrophils (Auto) 3.5 1.8 - 7.7 K/uL    Absolute Lymphocytes (Auto) 2.9 1.0 - 4.8 K/uL    Absolute Monocytes (Auto) 0.5 0.1 - 1.0 K/uL    Absolute Eosinophils (Auto) 0.3 0.0 - 0.5 K/uL    Absolute Basophils (Auto) 0.1 0.0 - 0.2 K/uL   BASIC METABOLIC PANEL    Collection Time: 12/02/23  3:50 AM   Result Value Ref Range    Potassium 3.5 3.5 - 5.5 mmol/L    Sodium 139 133 - 145 mmol/L    Chloride 97 (L) 98 - 110 mmol/L    Glucose 111 (H) 70 - 99 mg/dL    Calcium  10.2 8.4 - 10.5 mg/dL    BUN 9 6 - 22 mg/dL    Creatinine 1.3 0.8 - 1.4 mg/dL    CO2 29 20 - 32 mmol/L    eGFR 44.2 (L) >60.0 mL/min/1.73 sq.m.    Anion Gap 13.0 3.0 - 15.0 mmol/L   Thyroid  Cascade     Collection Time: 12/02/23  3:50 AM   Result Value Ref Range    TSH 2.20 0.27 - 4.20 mcU/mL   Lipid Complete Panel now    Collection Time: 12/02/23  3:50 AM   Result Value Ref Range    Cholesterol 246 (H) 110 - 200 mg/dL    Triglyceride 865 40 - 149 mg/dL    HDL 57 >=59 mg/dL    Cholesterol/HDL 4.3 0.0 - 5.0    Non-HDL Cholesterol 189 (H) <130 mg/dL    LDL CALCULATION 837 (H) 50 - 99 mg/dL    VLDL CALCULATION 27 8 - 30 mg/dL    LDL/HDL Ratio 2.8    Hemoglobin A1C tomorrow am    Collection Time: 12/02/23  3:50 AM   Result Value Ref Range    Hemoglobin A1c 6.1 (H) 4.8 - 5.6 %    Estimated Average Glucose 128 (H) 91 - 123 mg/dL   CBC WITH DIFFERENTIAL AUTO    Collection Time: 12/02/23  3:50 AM   Result Value Ref Range    WBC 7.3 4.0 - 11.0 K/uL    RBC 4.40 3.80 - 5.20 M/uL    HGB 11.3 (L) 11.7 - 16.1 g/dL    HCT 64.7 64.8 - 51.6 %    MCV 80 80 - 99 fL    MCH 26 26 - 34 pg    MCHC 32  31 - 36 g/dL    RDW 85.1 89.9 - 84.4 %    Platelet 303 140 - 440 K/uL    MPV 9.8 9.0 - 13.0 fL    Segmented Neutrophils (Auto) 43 40 - 75 %    Lymphocytes (Auto) 44 20 - 45 %    Monocytes (Auto) 8 3 - 12 %    Eosinophils (Auto) 5 0 - 6 %    Basophils (Auto) 1 0 - 2 %    Absolute Neutrophils (Auto) 3.1 1.8 - 7.7 K/uL    Absolute Lymphocytes (Auto) 3.2 1.0 - 4.8 K/uL    Absolute Monocytes (Auto) 0.6 0.1 - 1.0 K/uL    Absolute Eosinophils (Auto) 0.4 0.0 - 0.5 K/uL    Absolute Basophils (Auto) 0.0 0.0 - 0.2 K/uL   Troponin    Collection Time: 12/02/23  4:42 AM   Result Value Ref Range    Troponin (T) Quant High Sensitivity (5th Gen) 7 0 - 19 ng/L   D-Dimer now    Collection Time: 12/02/23  4:42 AM   Result Value Ref Range    D-Dimer <0.19 0.00 - 1.12 mg/L FEU   GLUCOSE SCREEN    Collection Time: 12/02/23  7:27 AM   Result Value Ref Range    Glucose POC 116 (H) 70 - 99 mg/dL        Activity:      Objective   BP: 111/53  Heart Rate: 70  Pulse: 64  Temp: 98.1 F (36.7 C)  Resp: 18  Weight: 91.8 kg (202 lb 6.1 oz)  SpO2: 91 %    Temp (24hrs),  Avg:98.2 F (36.8 C), Min:98 F (36.7 C), Max:98.4 F (36.9 C)      Signed   Asif Mahmood, MD     Hospital Medicine  12/02/2023  9:00 AM    Voice recognition dictation software has been used to generate this document.  In doing so, there is potential for error.  Examples of errors include spelling, punctuation, formatting, omission of words, insertion of words, incorrect words.  I have reviewed the document for accuracy.  Should there be concern for potential error that may impact treatment or billing, please reach out to me directly for clarification.

## 2023-12-02 NOTE — H&P (Signed)
 [employer]  [employer] [facility]    History and Physical  12/02/2023 5:20 AM      THE HOSPITALIST TEAM PREFERS TO USE EPIC CHAT FOR COMMUNICATION 7AM-7PM. IF I DO NOT RESPOND WITHIN 15 MINUTES, PLEASE PAGE ME/CALL THROUGH THE OPERATOR. FROM 7PM-7AM, PLEASE PAGE.     Were Interpreter Services Utilized?  No    Assessment and Plan   [name] is an 68 y.o. 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  Initial troponin 7  Echocardiogram September 2024 EF 65% grade 1 diastolic dysfunction     -Telemonitoring  -Serial EKG for new or worsening chest pain  -Check D-dimer however pulmonary embolism less likely secondary to chronic Eliquis  use  -Repeat troponin  -Torsemide ordered  -Morphine  as needed for chest pain  Patient voiced that Nitrostat causes hives and rash    #Chronic bilateral lower extremity edema,  chronic  Left more than right  -Continue home torsemide  -Check bilateral lower extremity Dopplers    #Heart failure with preserved ejection fraction, not in acute exacerbation  #Primary hypertension  #Paroxysmal atrial fibrillation, rate controlled     -Continue Toprol  25 mg daily  -Continue Eliquis  5 twice daily  -Continue home torsemide 20 mg daily        #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 Kelton     -Continue mycophenolate  sodium 1440 twice daily  -Diazepam  5 mg twice a day as needed for leg spasms           #COPD not in acute exacerbation  -Albuterol  as needed        #Epilepsy, controlled  -Lamotrigine  100 mg daily     #Bipolar  -Continue lurasidone  120 mg  -Continue Seroquel  50 mg nightly     #Restless leg syndrome  -Continue gabapentin  100 3 times daily  -Continue ropinirole  0.5 mg 2 tabs nightly     #GERD  -On Protonix  40 mg twice daily.  Not on formulary here  -Omeprazole  twice daily while inpatient           #Type 2 diabetes without insulin  dependence  -Low-dose insulin  with meals  -Keep glucose 140-180        #Microcytic anemia  MCV 79 hemoglobin 12.5     -Check folate, B12, iron panel  -Transfuse if hemoglobin less than 7     VTE Prophylaxis:   Eliquis   Chief Complaint    No chief complaint on file.       History of Present Illness   [name] is an 68 y.o. 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  He was pressure-like nonradiating that has not subsided.  Patient states that she had also noticed left facial droop on the morning on 11/30/2023.  She voiced mild  headache.  Denied any shortness of breath, palpitations diaphoresis dizziness,  Or any speech difficulties or any other focal neurological deficits. At [facility] afebrile heart rate 93 blood pressure 146/71.  Labs notable for CBC with differential MCV 79 hemoglobin 12.5 CMP potassium 3.6 creatinine 1.1 initial troponin 7.  EKG stable compared to prior no acute changes.  CT head negative CXR no acute process.  She was given morphine  4 mg for pain sent to [facility] for further management.  Patient seen and examined at bedside noting chest pains about 7 out of 10 in no acute distress.  Morphine  will be ordered.  Denies any chest chest pain or shortness of breath prior to 3 days onset.     Past Medical History     Past Medical History:   Diagnosis Date   . Acute, but ill-defined, cerebrovascular disease     TIA   . Arthropathy     knees   . Asthma    . Bipolar disorder (HCC)    . Cardiac arrhythmia     h/o Afib on Eliquis    . Cerebral artery occlusion with cerebral infarction [facility])     CVA post endodartorectomy   . Chest pain     has history multiple admissions for this, stress test and cardiac cath negative   . Chronic anemia     Hgb ~10   . Chronic obstructive pulmonary disease (HCC) 01/19/2017   . COPD (chronic obstructive pulmonary disease) (HCC)    . Cough    . COVID    . COVID-19 vaccine series completed    . CVA (cerebral vascular accident) (HCC) 09/01/2021   . Deficiency anemia 11/2020    rx with Iron Infusions   . Epilepsy (HCC)     per pt last Sz was approx 9-10 months ago   . Esophageal reflux    . Headache    . 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 Hemorrhoids    . History of blood transfusion     following one of her pregnancy   . History of cardiac catheterization no obstructive cad in 2017    . Hyperlipidemia    . Hyperlipidemia    . Hypertension    . Lower leg edema    . Migraines    . Paroxysmal atrial  fibrillation (HCC)     on anticoagulation   . Seizure-like activity (HCC)     EEG negative   . Stable angina pectoris (HCC) 02/15/2018   . Stiff person syndrome    . Syncope     recurrent, has loop recorder   . Thoracic aortic aneurysm (HCC)    . TIA (transient ischemic attack)     multiple since age 67's  per patient. Multiple MRI brain in the past negative for evidence of stroke. Suspected complicated migraine.   . Type 2 diabetes mellitus (HCC)     not on medication   . Unable to coordinate sucking, swallowing, and breathing 09/28/2019       Past Surgical History     Past Surgical History:   Procedure Laterality Date   . BREAST BIOPSY Right 06/18/2021    right ultrasound core biopsy with clip placed   . CARDIAC CATH  2016, 02/2016   . CAROTID ENDARTERECTOMY     . COLONOSCOPY N/A 07/01/2013    Procedure: COLONOSCOPY;  Surgeon: Neysa Katz, MD;  Location: [facility] ENDO OR LOC;  Service: Endoscopy GI;  Laterality: N/A;   . COLONOSCOPY N/A 04/13/2015    Procedure: COLONOSCOPY FLEXIBLE;  Surgeon: Glendia Sherry, MD;  Location: [facility] ENDO OR LOC;  Service: Endoscopy GI;  Laterality: N/A;   . COLONOSCOPY WITH BIOPSY N/A 04/13/2015    Procedure: COLONOSCOPY FLEXIBLE WITH BIOPSY;  Surgeon: Glendia Sherry, MD;  Location: [facility] ENDO OR LOC;  Service: Endoscopy GI;  Laterality: N/A;   . COLONOSCOPY WITH BIOPSY N/A 01/31/2017    Procedure: COLONOSCOPY FLEXIBLE WITH BIOPSY;  Surgeon: Vicci Emery DEL, MD   . COLONOSCOPY WITH BIOPSY N/A 05/29/2017    Procedure: COLONOSCOPY, WITH BIOPSY;  Surgeon: Josepha Marinell BROCKS, MD   . COLONOSCOPY WITH BIOPSY N/A 05/29/2017    Procedure: COLONOSCOPY, WITH BIOPSY;  Surgeon: Josepha Marinell BROCKS, MD   . EGD N/A 07/01/2013    Procedure: ESOPHAGOGASTRODUODENOSCOPY;  Surgeon: Neysa Katz, MD;  Location: [facility] ENDO OR LOC;  Service: Endoscopy GI;  Laterality: N/A;   . EGD N/A 08/08/2014    Procedure: ESOPHAGOGASTRODUODENOSCOPY FLEXIBLE TRANSORAL DIAGNOSTIC;  Surgeon: Glendia Copier, MD;   Location: [facility] ENDO OR LOC;  Service: Endoscopy GI;  Laterality: N/A;   . EGD WITH BIOPSY N/A 06/07/2016    Procedure: ESOPHAGOGASTRODUODENOSCOPY FLEXIBLE TRANSORAL WITH BIOPSY;  Surgeon: Vicci Emery DEL, MD   . EGD WITH BIOPSY N/A 03/20/2020    Procedure: EGD, WITH BIOPSY;  Surgeon: Josepha Marinell BROCKS, MD   . EGD WITH BLEEDING CONTROL N/A 05/29/2017    Procedure: EGD, WITH HEMORRHAGE CONTROL;  Surgeon: Josepha Marinell BROCKS, MD   . EXCISION OF BENIGN LESION Right 10/19/2021    Procedure: EXCISION, LESION, BENIGN, BREAST;  Surgeon: Clance Rosealee HERO, MD   . HEART CATHETERIZATION     . HYSTERECTOMY     . LOOP ELECTROSURGICAL EXCISON PROCEDURE     . LOOP RECORDER  2018   . OTHER Right 06/18/2021    axillary lymph node biopsy with clip placed   . OTHER  05/2021    Left leg ablation procedure   . VENOUS ACCESS CATHETER INSERTION Right 03/29/2019       Social History     Social History     Socioeconomic History   . Marital status: Divorced     Spouse name: Not on file   . Number of children: Not on file   . Years of education: Not on file   . Highest education level: Not on file   Occupational History   . Not on file   Tobacco Use   . Smoking status: Never     Passive exposure: Never   . Smokeless tobacco: Never   Vaping Use   . Vaping status: Never Used   Substance and Sexual Activity   . Alcohol use: No     Alcohol/week: 0.0 standard drinks of alcohol   .  Drug use: No   . Sexual activity: Not Currently   Other Topics Concern   . Back Care Not Asked   . Bike Helmet Not Asked   . Blood Transfusions Not Asked   . Caffeine  Concern Not Asked   . Counseling Not Asked   . Depression Concerns Not Asked   . Depression Screening Not Asked   . Exercise Not Asked   . Hobby Hazards Not Asked   . Military Service Not Asked   . Occupational Exposure Not Asked   . Seat Belt Not Asked   . Self-Exams Not Asked   . Sleep Concern Not Asked   . Smoke Detectors Not Asked   . Smoking Concerns Not Asked   . Smoking Cessation Not Asked   . Special Diet  Not Asked   . Stress Concern Not Asked   . Weight Concern Not Asked   . Do you use a bike helmet? Not Asked   . Do you use caffeine  daily? Not Asked   . Do you exercise? Not Asked   . Are there hazards related to your hobbies? Not Asked   . Military Service Not Asked   . Do you have any work place hazards? Not Asked   . Do you wear a seat belt while in a moving vehicle? Not Asked   . Do you have sleep concerns? Not Asked   . Do you follow a special diet? Not Asked   . Are you concerned with your weight? Not Asked   Social History Narrative    DNR/DNI    MPOA - her daughter Warrick Silvius     Social Drivers of Health     Financial Resource Strain: Low Risk  (07/28/2023)    Overall Financial Resource Strain (CARDIA)    . Difficulty of Paying Living Expenses: Not hard at all   Food Insecurity: No Food Insecurity (07/28/2023)    Hunger Vital Sign    . Worried About Programme Researcher, Broadcasting/film/video in the Last Year: Never true    . Ran Out of Food in the Last Year: Never true   Transportation Needs: No Transportation Needs (07/28/2023)    PRAPARE - Transportation    . Lack of Transportation (Medical): No    . Lack of Transportation (Non-Medical): No   Physical Activity: Insufficiently Active (07/28/2023)    Exercise Vital Sign    . Days of Exercise per Week: 2 days    . Minutes of Exercise per Session: 10 min   Stress: Stress Concern Present (07/28/2023)    [employer] of Occupational Health - Occupational Stress Questionnaire    . Feeling of Stress : To some extent   Social Connections: Moderately Integrated (07/28/2023)    Social Connection and Isolation Panel [NHANES]    . Frequency of Communication with Friends and Family: More than three times a week    . Frequency of Social Gatherings with Friends and Family: Once a week    . Attends Religious Services: More than 4 times per year    . Active Member of Clubs or Organizations: Yes    . Attends Banker Meetings: More than 4 times per year    . Marital Status: Divorced    Catering Manager Violence: Not At Risk (07/28/2023)    Humiliation, Afraid, Rape, and Kick questionnaire    . Fear of Current or Ex-Partner: No    . Emotionally Abused: No    . Physically Abused: No    .  Sexually Abused: No   Housing Stability: Low Risk  (07/28/2023)    Housing Stability Vital Sign    . Unable to Pay for Housing in the Last Year: No    . Number of Times Moved in the Last Year: 0    . Homeless in the Last Year: No        Family History     Family History   Problem Relation Age of Onset   . Hypertension Mother    . Diabetes Mother    . Hypertension Sister    . Diabetes Brother    . Hypertension Brother    . Other Family History Father         Unknown to patient   . Anesthesia Reaction Neg Hx        Allergies     Allergies   Allergen Reactions   . Montelukast rash/itching and swelling   . Nitroglycerin hives and rash/itching   . Immune Globulin  (Human) (Igg) swelling and rash/itching     Pt complain of itchiness and swelling of lips and tongue. Saturation fine, bp elevated.   . Magnesium  Sulfate rash/itching     Can take magnesium  supplement   . Amoxicillin hives     Has tolerated ceftriaxone, cephalexin   . Atorvastatin  unknown and other/intolerance     Rhabdomyolysis   Pt states she thinks it caused her to have rhabdo   . Bumex [Bumetanide] muscle/joint pain   . Ceftriaxone rash/itching   . Magnesium  Chloride rash/itching   . Sulfamethoxazole-Trimethoprim rash/itching   . Iodinated Contrast Media swelling     Throat swelling and lip itching   . Penicillins hives     Has tolerated cephalexin   . Tetracyclines hives   . Latex, Natural Rubber rash/itching and hives   . Kit Prep Of Tc-2m-Tetrofosmin unknown        Prior to Admission Medications     Prior to Admission Medications   Prescriptions Last Dose Patient Reported? Taking?   Ammonium Lactate 12 % Top CREA  Yes No   Sig: PLEASE SEE ATTACHED FOR DETAILED DIRECTIONS   EPINEPHrine (EPIPEN) 0.3 mg/0.3 mL Inj AtIn  No No   Sig: Inject 0.3 mL into the  muscle Take As Needed for Other (ANAPHYLAXIS). Q 10-30 minutes prn for ANAPHYLAXIS ONLY   Note (10/11/2023): Patient needs to get one    MAGNESIUM  GLYCINATE PO  Yes No   Sig: Take 100 mg by Mouth Once a Day. Indications: patient states she takes this over the counter   Mycophenolate  Sodium 360 mg PO TBEC  Yes No   Sig: Take 4 Tabs by Mouth Twice Daily.   QUEtiapine  SR (SEROQUEL  XR) 50 mg PO TB24  Yes No   Sig: Take 1 Tab by Mouth Every 24 Hours. Indications: manic-depression   TYPE-IN DME  No No   Sig: 4 PRONG CANE - Dx: G95.89, R26.9   apixaban  (ELIQUIS ) 5 mg PO TABS  Yes No   Sig: Take 1 Tab by Mouth Twice Daily.   dapagliflozin propanediol (FARXIGA) 10 mg PO TABS  No No   Sig: Take 1 Tab by Mouth Once a Day.   diazePAM  (VALIUM ) 5 mg PO TABS  Yes No   Sig: Take 1 Tab by Mouth 2 (two) times a day.   gabapentin  (NEURONTIN ) 100 mg PO CAPS  Yes No   Sig: Take 1 Cap by Mouth 3 Times Daily.   lamoTRIgine  (LAMICTAL ) 100 mg PO TABS  Yes No  Sig: Take 1 Tab by Mouth Once a Day. Indications: epilepsy   lurasidone  (LATUDA ) 40 mg PO TABS  Yes No   Sig: TAKE 1 TABLET DAILY WITH FOOD (AT LEAST 350 CALORIES)   lurasidone  120 mg PO TABS  Yes No   Sig: Take 1 Tab by Mouth Once a Day.   metoprolol  XL (TOPROL  XL) 25 mg PO TB24  Yes No   Sig: Take 1 Tab by Mouth Once a Day.   pantoprazole  (PROTONIX ) 40 mg PO TBEC  No No   Sig: TAKE 1 TABLET BY MOUTH TWICE A DAY   potassium chloride  ER (K-DUR;KLOR-CON  M) 10 mEq PO tablet,extended release (part/cryst)  Yes No   Sig: Take 1 Tab by Mouth 3 times a day.   promethazine  (PHENERGAN ) 25 mg PO TABS  Yes No   Sig: Take 1 Tab by Mouth Take As Needed.   rOPINIRole  (REQUIP ) 0.5 mg PO TABS  No No   Sig: TAKE 2 TABS BY MOUTH EVERY NIGHT AT BEDTIME.   torsemide (DEMADEX) 20 mg PO TABS  No No   Sig: Take 1 Tab by Mouth Once a Day.   Patient taking differently: Take 1 Tab by Mouth Twice Daily.   traMADoL  (ULTRAM ) 50 mg PO TABS  No No   Sig: Take 1 Tab by Mouth 2 Times Daily As Needed. for pain       Facility-Administered Medications: None         Review of System and Physical Exam      BP: 111/70  Heart Rate: 70  Pulse: 75  Temp: 98.4 F (36.9 C)  Resp: 16  Weight: 91.8 kg (202 lb 6.1 oz)  SpO2: 96 %    Temp (24hrs), Avg:98.2 F (36.8 C), Min:98 F (36.7 C), Max:98.4 F (36.9 C)            Physical Exam  Vitals and nursing note reviewed.   Constitutional:       Appearance: Normal appearance.   HENT:      Head: Normocephalic and atraumatic.   Eyes:      General:         Right eye: No discharge.         Left eye: No discharge.   Cardiovascular:      Rate and Rhythm: Normal rate and regular rhythm.      Pulses: Normal pulses.      Heart sounds: Normal heart sounds.   Pulmonary:      Effort: Pulmonary effort is normal. No respiratory distress.      Breath sounds: Normal breath sounds. No wheezing.   Abdominal:      General: There is no distension.      Palpations: Abdomen is soft. There is no mass.      Tenderness: There is no abdominal tenderness.      Hernia: No hernia is present.   Musculoskeletal:      Cervical back: Normal range of motion and neck supple.      Right lower leg: Edema present.      Left lower leg: Edema present.      Comments: Left more than right lower extremity swelling   Skin:     General: Skin is warm.      Capillary Refill: Capillary refill takes less than 2 seconds.   Neurological:      General: No focal deficit present.      Mental Status: She is alert and oriented to person, place, and time.  Psychiatric:         Mood and Affect: Mood normal.         Behavior: Behavior normal.                       Review of Systems   All other systems reviewed and are negative.         Problem List     Patient Active Problem List   Diagnosis   . Essential hypertension   . Paroxysmal atrial fibrillation (HCC)   . Bipolar 1 disorder (HCC)   . Iron deficiency anemia due to chronic blood loss   . Hypokalemia   . Pure hypercholesterolemia   . Gastroesophageal reflux disease without esophagitis   .  Chronic headache   . History of nuclear stress test in 05/2108   . History of cardiac catheterization no obstructive cad in 2017    . Transient neurologic deficit   . TIA (transient ischemic attack)   . Abnormal EKG   . Vertigo   . Slurred speech   . Headache   . Weakness of left upper extremity   . Progressive focal motor weakness   . History of cardiac cath no obstructive cad in cath in 2014 and 2017   . Hx of TIA (transient ischemic attack) and stroke 08/2016   . Right sided weakness   . Left-sided weakness   . Seizure (HCC)   . Chronic anemia   . Bleeding per rectum   . Syncope   . Iron deficiency anemia, unspecified   . Migraine   . Facial droop   . Left hand paresthesia   . Fall   . Unable to coordinate sucking, swallowing, and breathing   . Left sided numbness   . Chronic diastolic (congestive) heart failure (HCC)   . Acute focal neurological deficit   . Intractable headache   . Facial paresthesia   . Non-traumatic rhabdomyolysis   . Paresthesia   . Acute encephalopathy   . Acute on chronic anemia   . UTI (urinary tract infection)   . Suprapubic catheter dysfunction, initial encounter (HCC)   . Malfunction of indwelling urinary catheter (HCC)   . Syncope and collapse   . Neuropathy   . Neurogenic bladder   . Diastolic heart failure (HCC)   . Vitamin B6 induced neuropathy (HCC)   . Chest pain        -----  Focus of this inpatient stay will remain on problems that need acute care setting for care.      We will review available studies and will order additional labs, imaging and other studies as appropriate.     As needed medicines are ordered as appropriate.     VTE Prophylaxis will be ordered as appropriate.     Please see above for management plan for individual hospital problems.     Home medications are reviewed and will be continued as appropriate.     Patient will be continued to be followed during this hospital stay by a member of [facility].        Labs     Results for orders placed  or performed during the hospital encounter of 12/02/23 (from the past 24 hours)   BASIC METABOLIC PANEL   Result Value Ref Range    Potassium 3.5 3.5 - 5.5 mmol/L    Sodium 139 133 - 145 mmol/L    Chloride 97 (L) 98 - 110 mmol/L  Glucose 111 (H) 70 - 99 mg/dL    Calcium  10.2 8.4 - 10.5 mg/dL    BUN 9 6 - 22 mg/dL    Creatinine 1.3 0.8 - 1.4 mg/dL    CO2 29 20 - 32 mmol/L    eGFR 44.2 (L) >60.0 mL/min/1.73 sq.m.    Anion Gap 13.0 3.0 - 15.0 mmol/L   Thyroid  Cascade   Result Value Ref Range    TSH 2.20 0.27 - 4.20 mcU/mL   Lipid Complete Panel now   Result Value Ref Range    Cholesterol 246 (H) 110 - 200 mg/dL    Triglyceride 865 40 - 149 mg/dL    HDL 57 >=59 mg/dL    Cholesterol/HDL 4.3 0.0 - 5.0    Non-HDL Cholesterol 189 (H) <130 mg/dL    LDL CALCULATION 837 (H) 50 - 99 mg/dL    VLDL CALCULATION 27 8 - 30 mg/dL    LDL/HDL Ratio 2.8    CBC WITH DIFFERENTIAL AUTO   Result Value Ref Range    WBC 7.3 4.0 - 11.0 K/uL    RBC 4.40 3.80 - 5.20 M/uL    HGB 11.3 (L) 11.7 - 16.1 g/dL    HCT 64.7 64.8 - 51.6 %    MCV 80 80 - 99 fL    MCH 26 26 - 34 pg    MCHC 32 31 - 36 g/dL    RDW 85.1 89.9 - 84.4 %    Platelet 303 140 - 440 K/uL    MPV 9.8 9.0 - 13.0 fL    Segmented Neutrophils (Auto) 43 40 - 75 %    Lymphocytes (Auto) 44 20 - 45 %    Monocytes (Auto) 8 3 - 12 %    Eosinophils (Auto) 5 0 - 6 %    Basophils (Auto) 1 0 - 2 %    Absolute Neutrophils (Auto) 3.1 1.8 - 7.7 K/uL    Absolute Lymphocytes (Auto) 3.2 1.0 - 4.8 K/uL    Absolute Monocytes (Auto) 0.6 0.1 - 1.0 K/uL    Absolute Eosinophils (Auto) 0.4 0.0 - 0.5 K/uL    Absolute Basophils (Auto) 0.0 0.0 - 0.2 K/uL         Imaging     Recent Results (from the past 36 hours)   1. ED CT HEAD NO CONTRAST    Narrative    CLINICAL DATA:  Left-sided facial droop.    EXAM:  CT HEAD WITHOUT CONTRAST    TECHNIQUE:  Contiguous axial images were obtained from the base of the skull  through the vertex without intravenous contrast.    RADIATION DOSE REDUCTION: This exam was performed  according to the  departmental dose-optimization program which includes automated  exposure control, adjustment of the mA and/or kV according to  patient size and/or use of iterative reconstruction technique.    COMPARISON:  January 12, 2023    FINDINGS:  Brain: There is mild cerebral atrophy with widening of the  extra-axial spaces and ventricular dilatation.  There are areas of decreased attenuation within the white matter  tracts of the supratentorial brain, consistent with microvascular  disease changes.    Cavum septum pellucidum et vergae is noted.    Vascular: No hyperdense vessel or unexpected calcification.    Skull: Normal. Negative for fracture or focal lesion.    Sinuses/Orbits: No acute finding.    Other: None.      Impression    IMPRESSION:  No acute intracranial abnormality.  Electronically Signed    By: Suzen Dials M.D.    On: 12/01/2023 20:45     2. CHEST PORTABLE - Chest Pain    Narrative    CLINICAL DATA:  Chest pain.    EXAM:  PORTABLE CHEST 1 VIEW    COMPARISON:  July 11, 2023    FINDINGS:  The heart size and mediastinal contours are within normal limits.  Both lungs are clear. The visualized skeletal structures are  unremarkable.      Impression    IMPRESSION:  No active disease.      Electronically Signed    By: Suzen Dials M.D.    On: 12/01/2023 20:40     3. EKG 12 LEAD UNIT PERFORMED   Result Value Ref Range    Heart Rate 84 bpm    RR Interval 716 ms    Atrial Rate 84 ms    P-R Interval 170 ms    P Duration 123 ms    P Horizontal Axis 5 deg    P Front Axis 28 deg    Q Onset 503 ms    QRSD Interval 103 ms    QT Interval 346 ms    QTcB 409 ms    QTcF 387 ms    QRS Horizontal Axis 9 deg    QRS Axis 9 deg    I-40 Front Axis 26 deg    t-40 Horizontal Axis -6 deg    T-40 Front Axis 8 deg    T Horizontal Axis 200 deg    T Wave Axis 156 deg    S-T Horizontal Axis 193 deg    S-T Front Axis 207 deg    Impression - ABNORMAL ECG -     Impression SR-Sinus rhythm-normal P axis, V-rate  50-99     Impression       ET-Abnormal R-wave progression, early transition-QRS area>0 in V2    Impression       -Probable LVH with secondary repolarization abnormality, may represent   inferolateral wall ischemia, unchanged since 09/24/2023 and priors-          Signed   Consuelo Sieving, MD    Hospital Medicine  12/02/2023  5:20 AM     Voice recognition dictation software has been used to generate this document.  In doing so, there is potential for error.  Examples of errors include spelling, punctuation, formatting, omission of words, insertion of words, incorrect words.  I have reviewed the document for accuracy.  Should there be concern for potential error that may impact treatment or billing, please reach out to me directly for clarification.

## 2023-12-02 NOTE — Progress Notes (Signed)
 Discharge planning if brain MRI is normal

## 2023-12-03 NOTE — Unmapped External Note (Addendum)
 Speech Language Pathology Comprehensive Evaluation    General Info:  Room:  0321/0321-01  Visit Time In: 0902  Visit Time Out: 0932    Precautions:  General Precautions: Universal precautions, Fall precautions    Therapy Considerations:     Risk for Aspiration: None  Recommendations: Dysphagia treatment  Diet Recommendations: Regular solids and Thin liquids  Compensatory Strategy Recommendations: Upright as possible for all oral intake and Small bites/sips  Recommended Form of Medications: Whole  Level of Therapy Upon Discharge:      Pertinent Medical Hx/Presentation    History of Present Condition:   Chart Reviewed. History of Present Problem: 68 y.o. female with past medical history of nonobstructive CAD, atrial fibrillation (on Eliquis ) heart failure with preserved ejection fraction COPD epilepsy, autoimmune myelitis (dorsal column, myelopathy) Does Not have stiff person syndrome per Neuro, Bipolar disorder, history of CVA post endarterectomy, type 2 diabetes who came from Home to [facility] initially with chief complaint of intermittent midsternal chest pain ongoing for 2 days and found to have a left facial droop.    Labs/Imaging:   Chest Imaging: 12/01/23 - No active disease.   CT: Head 12/01/23 - No acute intracranial abnormality.   MRI: Head 12/03/23 scheduled - results pending    Precipitating/Acute Dysphagia Risk Factors: ? CVA    Predisposing/Chronic Dysphagia Risk Factors: H/o CVA, COPD, Epilepsy,      Clinical Signs of Chronic Dysphagia: Patient/Caregiver report of dysphagia symptoms, patient endorses left side residue of solids during meals.     Prior Level of Function:  Living Environment: Family/caregiver support, Diet: Regular, Hearing: intact, and Baseline Method of Nutrition: Oral    Communication Needs:   Communication Needs: None    Subjective:  Resting in bed watching TV; Agreeable to ST    Pain:    No/denies pain    Clinical Bedside Swallow Evaluation    Mentation: Alert and Oriented x4    Respiratory  Status:   O2 delivery method: Room air    Oral Motor/Cranial Nerve Assessment:    Cranial Nerve V:  Mandibular/Motor: WFL  Sensation Forehead: Normal  Sensation Cheek: Normal    Cranial Nerve VII:   Facial Symmetry: L droop  Labial Deficit: L droop     Cranial Nerve IX/X:  Soft Palate Movement: WFL  Voicing: WFL  Volitional Cough: Strong    Cranial Nerve XII:  Lingual Motor: WFL    Positioning: Upright in bed    PO Trials:    Thin Liquids:  Presentation: Straw  Oral Phase: WFL  Pharyngeal Phase: Clinical evidence of impairment: No  [place] Swallow Protocol/3 oz. Water test: Pass     Regular:  Presentation: Self fed  Oral Phase:  Mild left residual after the swallow; independently cleared with lingual sweep and thin liquid  Pharyngeal Phase: Clinical evidence of impairment: No    Cognitive-Communication/Linguistics Evaluation    Cognition:  Assessment measure: Informal (patient was scheduled for MRI, limiting time for completion of testing)  Orientation: WFL  Memory: Impaired; Mild   Problem solving: Impaired  Executive functioning: Impaired; pull-to-stimulus response on clock drawing test  Safety awareness: WFL    Language:   Assessment measure: Informal  Expressive: Impaired; object naming 6/6; categorical naming of only 6 items within 60 seconds I am drawing a blank  Receptive: WFL  Repetition: WFL    Motor Speech:   Oral apraxia: no  Verbal apraxia: no  Dysarthria: no  Fluency: WFL  Intelligibility: 100%    Voice:   WNL  Safety & Handoff:    Repositioned: Upright in bed  Safety: call light within reach, phone within reach, bedside table accessible, fall mat, caregiver at bedside, nurse at bedside, and bed in low position    Rehab Potential:  excellent    Patient Education:  ST POC; Education was provided regarding signs, symptoms, risk factors, and prevention of CVA in addition to Speech Language Pathologists's role in assessment and treatment of cognitive-communication and swallowing impairments following  neurological insult. Patient and family had opportunity to ask questions.     Assessment:  Cranial nerve exam indicate left side facial weakness; however, medical records from previous ST assessment 01/13/23, indicate this is pre-existing. No dysarthria detected. Clinical evaluation of swallow at bedside did not reveal significant signs of pharyngeal impairment, although patient endorses occasional left buccal residue after the swallow of solid foods. Currently she independently clears with use of lingual sweep or liquid wash. SLP recommend continue with regular solids and thin liquids.     Informal cognitive assessment indicate at the least, mild cognitive impairment. This too appear to be pre-existing based upon the results of her previous assessment. Full cognitive assessment is indicated to determine if there has been a significant change from her baseline. Full assessment was incomplete due to time constraints and scheduling conflicts. Patient may benefit from brief ST services for cognitive-linguistic services and patient/caregiver education.     Plan:    ST Therapy Plan: Continue Speech Therapy  ST Frequency: 2 visits  ST Interventions: cognitive linguistic treatment, patient/family education, diet safety and tolerance    Goals:    STG:  Patient will participate in full cognitive assessment to determine changes from baseline and/or for development of ST POC.     Treatment Dx     ICD-9-CM ICD-10-CM   1. Impaired mobility and activities of daily living  V49.89 Z74.09     Z78.9     Jon Gearing, M.S., CCC-SLP  [phone]

## 2023-12-04 NOTE — Unmapped External Note (Signed)
 Ambulatory CCS RN   ED/Hospital Discharge   Transition of Care Note      [name] is a 68 y.o. female patient    PCP: [name], MD    Discharge from: [facility]  Diagnosis: Facial droop   Date(s): 2/8-12/03/23    Chart Reviewed    Assessment: PT populated on Hospital d/c list, pt is being followed by [facility] L. Delores for CCM. Reached out to [facility]  and she will follow up with pt for [facility] call.     CCS RN available for any future needs.     Flint Doom, RN  Centralized Clinical Services  [facility]        Reason for Visit:  Transitions Management    Type of Nursing Service Rendered:  Chart Review    Time Spent on Activity:  0-15 minutes

## 2023-12-12 NOTE — Unmapped External Note (Signed)
 [facility]  Integrated Care Management  [facility]  [name], MD    Monthly Outreach/ED follow up    [name] is a 68 y.o. female patient.    AMB CM CONT FOLLOW-UP:   Assessment completed with:  Patient    HIPPA information verified:  Address and DOB      Visit Location:  Telephonic    Purpose of call:  Uncontrolled disease state and ED visit  Insurance:  Medicare  Psychosocial:  Cognitive limitations and caregiver available  Medication review:  Adherence or non-adherence    General Interventions:  Educate patient regarding importance of keeping regularly scheduled appointments with PCP and specialists and Provide patient with Care Manager contact information    Plan:  Continue to follow in case management        Time spent:  < 60 minutes      Assessment:  Patient is feeling ok; drooping to left side of face is getting better  Patient currently en route to psychiatrist and unable to speak via phone    Not having chest pain  Weight this AM: 198 lbs    Unable to perform ROS - Reason: other   Patient in public transportation vehicle           Active Goals          Patient Goals    1. Cope with Chronic Pain      Follow Up Date 01/08/2024     - learn relaxation techniques  - practice acceptance of chronic pain  - tell myself I can (not I can't)  - use relaxation during pain      Why is this important?    Feelings like depression, anxiety, stress and anger can make your body more sensitive to pain.   Learning ways to cope with feelings may help you find some relief from the pain.      Notes:       2. Manage Pain      Follow Up Date: 01/08/2024       - call for medicine refill 2 or 3 days before it runs out  - develop a personal pain management plan  - keep track of prescription refills  - prioritize tasks for the day  - track times pain is worst and when it is best  - track what makes the pain worse and what makes it better  - use cold or heat for pain relief      Why is this important?     Day-to-day life can be hard when you have chronic pain.   Pain medicine is just one option to manage pain.    You can try these action steps to help you manage your pain.      Notes:       3. Track and Manage My Blood Pressure      Follow up date 01/08/2024     - check blood pressure daily  - choose a place to take my blood pressure (home, clinic or office, retail store)  - write blood pressure results in a log or diary      Why is this important?    You won't feel high blood pressure, but it can still hurt your blood vessels.   High blood pressure can cause heart or kidney problems. It can also cause a stroke.   Making lifestyle changes like losing a little weight or eating less salt will help.  Checking your blood pressure at home and at different times of the day can help to control blood pressure.   If the doctor prescribes medicine remember to take it the way the doctor ordered.   Call the office if you cannot afford the medicine or if there are questions about it.       Notes:               Home Medication List - Marked as Reviewed on 12/01/23 2005   Medication Sig   Ammonium Lactate 12 % Top CREA PLEASE SEE ATTACHED FOR DETAILED DIRECTIONS   apixaban  (ELIQUIS ) 5 mg PO TABS Take 1 Tab by Mouth Twice Daily.   dapagliflozin propanediol (FARXIGA) 10 mg PO TABS Take 1 Tab by Mouth Once a Day.   diazePAM  (VALIUM ) 5 mg PO TABS Take 1 Tab by Mouth 2 (two) times a day.   EPINEPHrine (EPIPEN) 0.3 mg/0.3 mL Inj AtIn Inject 0.3 mL into the muscle Take As Needed for Other (ANAPHYLAXIS). Q 10-30 minutes prn for ANAPHYLAXIS ONLY   gabapentin  (NEURONTIN ) 100 mg PO CAPS Take 1 Cap by Mouth 3 Times Daily.   lamoTRIgine  (LAMICTAL ) 100 mg PO TABS Take 1 Tab by Mouth Once a Day. Indications: epilepsy   lurasidone  (LATUDA ) 40 mg PO TABS TAKE 1 TABLET DAILY WITH FOOD (AT LEAST 350 CALORIES)   MAGNESIUM  GLYCINATE PO Take 100 mg by Mouth Once a Day. Indications: patient states she takes this over the counter   metoprolol  XL (TOPROL   XL) 25 mg PO TB24 Take 1 Tab by Mouth Once a Day.   Mycophenolate  Sodium 360 mg PO TBEC Take 4 Tabs by Mouth Twice Daily.   pantoprazole  (PROTONIX ) 40 mg PO TBEC TAKE 1 TABLET BY MOUTH TWICE A DAY   potassium chloride  ER (K-DUR;KLOR-CON  M) 10 mEq PO tablet,extended release (part/cryst) Take 1 Tab by Mouth 3 times a day.   promethazine  (PHENERGAN ) 25 mg PO TABS Take 1 Tab by Mouth Take As Needed.   QUEtiapine  SR (SEROQUEL  XR) 50 mg PO TB24 Take 1 Tab by Mouth Every 24 Hours. Indications: manic-depression   rOPINIRole  (REQUIP ) 0.5 mg PO TABS TAKE 2 TABS BY MOUTH EVERY NIGHT AT BEDTIME.   torsemide (DEMADEX) 20 mg PO TABS Take 1 Tab by Mouth Once a Day.   traMADoL  (ULTRAM ) 50 mg PO TABS Take 1 Tab by Mouth 2 Times Daily As Needed. 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 Essential hypertension   . Paroxysmal atrial fibrillation (HCC)   . Bipolar 1 disorder (HCC)   . Iron deficiency anemia due to chronic blood loss   . Hypokalemia   . Pure hypercholesterolemia   . Gastroesophageal reflux disease without esophagitis   . Chronic headache   . History of nuclear stress test in 05/2108   . History of cardiac catheterization no obstructive cad in 2017    . Transient neurologic deficit   . TIA (transient ischemic attack)   . Abnormal EKG   . Vertigo   . Slurred speech   . Headache   . Weakness of left upper extremity   . Progressive focal motor weakness   . History of cardiac cath no obstructive cad in cath in 2014 and 2017   . Hx of TIA (transient ischemic attack) and stroke 08/2016   . Right sided weakness   . Left-sided weakness   .  Seizure (HCC)   . Chronic anemia   . Bleeding per rectum   . Syncope   . Iron deficiency anemia, unspecified   . Migraine   . Facial droop   . Left hand paresthesia   . Fall   . Unable to coordinate sucking, swallowing, and breathing   . Left sided numbness   . Chronic diastolic (congestive) heart failure (HCC)   . Acute focal  neurological deficit   . Intractable headache   . Facial paresthesia   . Non-traumatic rhabdomyolysis   . Paresthesia   . Acute encephalopathy   . Acute on chronic anemia   . UTI (urinary tract infection)   . Suprapubic catheter dysfunction, initial encounter (HCC)   . Malfunction of indwelling urinary catheter (HCC)   . Syncope and collapse   . Neuropathy   . Neurogenic bladder   . Diastolic heart failure (HCC)   . Vitamin B6 induced neuropathy (HCC)   . Chest pain       Past Medical History:   Diagnosis Date   . Acute, but ill-defined, cerebrovascular disease     TIA   . Arthropathy     knees   . Asthma    . Bipolar disorder (HCC)    . Cardiac arrhythmia     h/o Afib on Eliquis    . Cerebral artery occlusion with cerebral infarction [facility])     CVA post endodartorectomy   . Chest pain     has history multiple admissions for this, stress test and cardiac cath negative   . Chronic anemia     Hgb ~10   . Chronic obstructive pulmonary disease (HCC) 01/19/2017   . COPD (chronic obstructive pulmonary disease) (HCC)    . Cough    . COVID    . COVID-19 vaccine series completed    . CVA (cerebral vascular accident) (HCC) 09/01/2021   . Deficiency anemia 11/2020    rx with Iron Infusions   . Epilepsy (HCC)     per pt last Sz was approx 9-10 months ago   . Esophageal reflux    . Headache    . 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 Hemorrhoids    . History of blood transfusion     following one of her pregnancy   . History of cardiac catheterization no obstructive cad in 2017    . Hyperlipidemia    . Hyperlipidemia    . Hypertension    . Lower leg edema    . Migraines    . Paroxysmal atrial fibrillation (HCC)     on anticoagulation   . Seizure-like activity (HCC)     EEG negative   . Stable angina pectoris (HCC) 02/15/2018   . Stiff person syndrome    . Syncope     recurrent, has loop recorder   . Thoracic aortic aneurysm (HCC)    . TIA (transient ischemic  attack)     multiple since age 62's per patient. Multiple MRI brain in the past negative for evidence of stroke. Suspected complicated migraine.   . Type 2 diabetes mellitus (HCC)     not on medication   . Unable to coordinate sucking, swallowing, and breathing 09/28/2019       Patient Care Team:  Trudy Mina, MD as PCP - General (Family Practice)  Read Papas, MD as Cardiologist (Cardiology)  Chan, Farn, MD as Hematologist (Hematology and Oncology)  Josepha Marinell BROCKS, MD as Gastroenterologist (  Gastroenterology)  Dr. Tex as Psychiatrist (Psychiatry)  Josephus Borg, NP as Urologist (Urology)  Delores Aubry HERO, RN as RN    Aubry Jama Delores, BSN, RN, CNOR  RN Integrated Care Manager  [facility]  [phone]

## 2023-12-16 NOTE — ED Provider Notes (Signed)
 [facility] EMERGENCY DEPARTMENT    Time of Arrival:   12/16/23 0825        Final diagnoses:   [M71.22] Synovial cyst of left popliteal space (Primary)       Medical Decision Making:      Differential/Questionable Diagnosis:   pt presenting with diarrhea,  most consistent with viral gastroenteritis. Differential includes invasive/toxic diarrhea, sepsis, influenza, along with the far less likely surgical etiologies such as volvulus, appendicitis, malro, and SBO. No change in diet or abnormal exposures. No known stagnant water exposure, recent camping/hiking. No dietary history or bloody BM's suggestive of B. Cereus, S. Aureus, or other invasive bacterial enteric pathogens. Pt with good capillary refill (<2 sec), MMM, and is nonseptic in appearance. Clinically is not dehydrated.  Unlikely to represent unusual manifestation of UTI, GERD, partial or complete anatomical obstruction, or other acute abdomen. Pt tolerating PO rehydration and is very well-appearing.  Plan: Presumed self-limited etiology; plan to [place] home with return precautions and oral rehydration education.                               Glasgow Coma Scale Score: 15                      ED Course: Laboratory & radiographic studies reviewed and considered in the medical decision-making.     Re-evaluation: Pt has improved. Patient is nontoxic. Resting comfortably.    I have spoken with the patient and/or caregivers. I have explained the patient's condition, results, diagnoses and treatment plan based on the information available to me at this time. I have answered the patient's and/or caregiver's questions and addressed any concerns. The patient and/or caregivers have as good an understanding of the patient's diagnosis, condition and treatment plan as can be expected at this point. The vital signs have been stable. The patient's condition is stable and appropriate for discharge from the emergency department.     The patient will pursue further  outpatient evaluation with the primary care physician or other designated or consulting physician as outlined in the discharge instructions. The patient and/or caregivers are agreeable to this plan of care and follow-up instructions have been explained in detail. The patient and/or caregivers have received these instructions in written format and have expressed an understanding of the discharge instructions. The patient and/or caregivers are aware that any significant change in condition or worsening of symptoms should prompt an immediate return to this or the closest emergency department or a call to 911.                                   ED US  VEIN DUPLEX LE UNILAT   Final Result   IMPRESSION:   1. No evidence of left lower extremity DVT.   2. Left popliteal fossa fluid collection, most often a Baker's cyst.         Electronically Signed     By: Limin  Xu M.D.     On: 12/16/2023 09:43             .             .    Disposition:  Home    Discharge Medication List as of 12/16/2023  9:47 AM        START taking these medications    Details  Loperamide 2 mg PO TABS Take 1 Tab by Mouth Every 4 Hours As Needed (loose stools). Not to exceed 8 tablets in 24 hours., Disp-30 Tab, R-0, Normal      ondansetron  (ZOFRAN ) 4 mg PO ODT. Take 1 Tab by Mouth Every 8 Hours As Needed (PRN FOR NAUSEA AND VOMITING). ALLOW TABLET TO DISSOLVE ON TONGUE., Disp-12 Tab, R-0, Normal           Chief Complaint   Patient presents with   . LEG PAIN (NON-TRAUMATIC)   . ABDOMINAL PAIN       68 y.o. 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 Despite triage not, she DENIES having abdominal pain.                     Review of Systems:  Constitutional:  Negative for fever and chills.   HENT:  Negative for sore throat, neck pain and neck stiffness.    Eyes:  Negative for photophobia and visual disturbance.   Respiratory:  Negative for cough and shortness of breath.    Cardiovascular:  Negative for chest pain and leg swelling.   Gastrointestinal:  Positive for nausea and diarrhea. Negative for vomiting and abdominal pain.   Genitourinary:  Negative for dysuria and frequency.   Musculoskeletal:  Negative for back pain.   Skin:  Negative for rash.   Neurological:  Negative for dizziness, seizures, syncope, weakness, light-headedness, numbness, headaches, loss of consciousness and confusion.       Physical Exam  Vitals and nursing note reviewed.   Constitutional:       General: She is not in acute distress.     Appearance: Normal appearance. She is not ill-appearing, toxic-appearing or diaphoretic.   HENT:      Head: Normocephalic and atraumatic.      Nose: Nose normal.      Mouth/Throat:      Mouth: Mucous membranes are moist.      Pharynx: Oropharynx is clear.   Eyes:      Extraocular Movements: Extraocular movements intact.      Conjunctiva/sclera: Conjunctivae normal.      Pupils: Pupils are equal, round, and reactive to light.   Neck:      Vascular: No carotid bruit.   Cardiovascular:      Rate and Rhythm: Normal rate and regular rhythm.      Pulses: Normal pulses.      Heart sounds: Normal heart sounds.   Pulmonary:      Effort: Pulmonary effort is normal. No respiratory distress.      Breath sounds: Normal breath sounds. No wheezing.   Abdominal:      General: Abdomen is flat.      Palpations: Abdomen is soft.      Tenderness: There is no abdominal tenderness.   Musculoskeletal:         General: No swelling, tenderness or deformity. Normal range of motion.      Cervical back: No rigidity or tenderness.      Right lower leg: No edema.       Left lower leg: No  edema.   Skin:     General: Skin is warm and dry.      Capillary Refill: Capillary refill takes less than 2 seconds.      Findings: No rash.   Neurological:      General: No focal deficit present.      Mental Status: She is alert and oriented to person, place, and time. Mental status is at baseline.   Psychiatric:         Mood and Affect: Mood normal.         Thought Content: Thought content normal.         Past Medical History:   Diagnosis Date   . Acute, but ill-defined, cerebrovascular disease     TIA   . Arthropathy     knees   . Asthma    . Bipolar disorder (HCC)    . Cardiac arrhythmia     h/o Afib on Eliquis    . Cerebral artery occlusion with cerebral infarction [facility])     CVA post endodartorectomy   . Chest pain     has history multiple admissions for this, stress test and cardiac cath negative   . Chronic anemia     Hgb ~10   . Chronic obstructive pulmonary disease (HCC) 01/19/2017   . COPD (chronic obstructive pulmonary disease) (HCC)    . Cough    . COVID    . COVID-19 vaccine series completed    . CVA (cerebral vascular accident) (HCC) 09/01/2021   . Deficiency anemia 11/2020    rx with Iron Infusions   . Epilepsy (HCC)     per pt last Sz was approx 9-10 months ago   . Esophageal reflux    . Headache    . 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 Hemorrhoids    . History of blood transfusion     following one of her pregnancy   . History of cardiac catheterization no obstructive cad in 2017    . Hyperlipidemia    . Hyperlipidemia    . Hypertension    . Lower leg edema    . Migraines    . Paroxysmal atrial fibrillation (HCC)     on anticoagulation   . Seizure-like activity (HCC)     EEG negative   . Stable angina pectoris (HCC) 02/15/2018   . Stiff person syndrome    . Syncope     recurrent, has loop recorder   . Thoracic aortic aneurysm (HCC)    . TIA (transient ischemic attack)     multiple since age 89's per patient.  Multiple MRI brain in the past negative for evidence of stroke. Suspected complicated migraine.   . Type 2 diabetes mellitus (HCC)     not on medication   . Unable to coordinate sucking, swallowing, and breathing 09/28/2019     Past Surgical History:   Procedure Laterality Date   . BREAST BIOPSY Right 06/18/2021    right ultrasound core biopsy with clip placed   . CARDIAC CATH  2016, 02/2016   . CAROTID ENDARTERECTOMY     . COLONOSCOPY N/A 07/01/2013    Procedure: COLONOSCOPY;  Surgeon: Neysa Katz, MD;  Location: [facility] ENDO OR LOC;  Service: Endoscopy GI;  Laterality: N/A;   . COLONOSCOPY N/A 04/13/2015    Procedure: COLONOSCOPY FLEXIBLE;  Surgeon: Glendia Sherry, MD;  Location: [facility] ENDO OR LOC;  Service: Endoscopy GI;  Laterality: N/A;   . COLONOSCOPY  WITH BIOPSY N/A 04/13/2015    Procedure: COLONOSCOPY FLEXIBLE WITH BIOPSY;  Surgeon: Glendia Sherry, MD;  Location: [facility] ENDO OR LOC;  Service: Endoscopy GI;  Laterality: N/A;   . COLONOSCOPY WITH BIOPSY N/A 01/31/2017    Procedure: COLONOSCOPY FLEXIBLE WITH BIOPSY;  Surgeon: Vicci Emery DEL, MD   . COLONOSCOPY WITH BIOPSY N/A 05/29/2017    Procedure: COLONOSCOPY, WITH BIOPSY;  Surgeon: Josepha Marinell BROCKS, MD   . COLONOSCOPY WITH BIOPSY N/A 05/29/2017    Procedure: COLONOSCOPY, WITH BIOPSY;  Surgeon: Josepha Marinell BROCKS, MD   . EGD N/A 07/01/2013    Procedure: ESOPHAGOGASTRODUODENOSCOPY;  Surgeon: Neysa Katz, MD;  Location: [facility] ENDO OR LOC;  Service: Endoscopy GI;  Laterality: N/A;   . EGD N/A 08/08/2014    Procedure: ESOPHAGOGASTRODUODENOSCOPY FLEXIBLE TRANSORAL DIAGNOSTIC;  Surgeon: Glendia Copier, MD;  Location: [facility] ENDO OR LOC;  Service: Endoscopy GI;  Laterality: N/A;   . EGD WITH BIOPSY N/A 06/07/2016    Procedure: ESOPHAGOGASTRODUODENOSCOPY FLEXIBLE TRANSORAL WITH BIOPSY;  Surgeon: Vicci Emery DEL, MD   . EGD WITH BIOPSY N/A 03/20/2020    Procedure: EGD, WITH BIOPSY;  Surgeon: Josepha Marinell BROCKS, MD   . EGD WITH BLEEDING CONTROL N/A 05/29/2017     Procedure: EGD, WITH HEMORRHAGE CONTROL;  Surgeon: Josepha Marinell BROCKS, MD   . EXCISION OF BENIGN LESION Right 10/19/2021    Procedure: EXCISION, LESION, BENIGN, BREAST;  Surgeon: Clance Rosealee HERO, MD   . HEART CATHETERIZATION     . HYSTERECTOMY     . LOOP ELECTROSURGICAL EXCISON PROCEDURE     . LOOP RECORDER  2018   . OTHER Right 06/18/2021    axillary lymph node biopsy with clip placed   . OTHER  05/2021    Left leg ablation procedure   . VENOUS ACCESS CATHETER INSERTION Right 03/29/2019     Family History   Problem Relation Age of Onset   . Hypertension Mother    . Diabetes Mother    . Hypertension Sister    . Diabetes Brother    . Hypertension Brother    . Other Family History Father         Unknown to patient   . Anesthesia Reaction Neg Hx      Social History     Occupational History   . Not on file   Tobacco Use   . Smoking status: Never     Passive exposure: Never   . Smokeless tobacco: Never   Vaping Use   . Vaping status: Never Used   Substance and Sexual Activity   . Alcohol use: No     Alcohol/week: 0.0 standard drinks of alcohol   . Drug use: No   . Sexual activity: Not Currently     Outpatient Medications Marked as Taking for the 12/16/23 encounter [facility] Encounter)   Medication Sig Dispense Refill   . Ammonium Lactate 12 % Top CREA PLEASE SEE ATTACHED FOR DETAILED DIRECTIONS     . apixaban  (ELIQUIS ) 5 mg PO TABS Take 1 Tab by Mouth Twice Daily.     . dapagliflozin propanediol (FARXIGA) 10 mg PO TABS Take 1 Tab by Mouth Once a Day. 30 Tab 0   . diazePAM  (VALIUM ) 5 mg PO TABS Take 1 Tab by Mouth 2 (two) times a day.     SABRA EPINEPHrine (EPIPEN) 0.3 mg/0.3 mL Inj AtIn Inject 0.3 mL into the muscle Take As Needed for Other (ANAPHYLAXIS). Q 10-30 minutes prn for ANAPHYLAXIS ONLY 2 Each 1   .  gabapentin  (NEURONTIN ) 100 mg PO CAPS Take 1 Cap by Mouth 3 Times Daily.     . lamoTRIgine  (LAMICTAL ) 100 mg PO TABS Take 1 Tab by Mouth Once a Day. Indications: epilepsy     . Loperamide 2 mg PO TABS Take 1 Tab by Mouth Every  4 Hours As Needed (loose stools). Not to exceed 8 tablets in 24 hours. 30 Tab 0   . lurasidone  (LATUDA ) 40 mg PO TABS TAKE 1 TABLET DAILY WITH FOOD (AT LEAST 350 CALORIES)     . MAGNESIUM  GLYCINATE PO Take 100 mg by Mouth Once a Day. Indications: patient states she takes this over the counter     . metoprolol  XL (TOPROL  XL) 25 mg PO TB24 Take 1 Tab by Mouth Once a Day.     . Mycophenolate  Sodium 360 mg PO TBEC Take 4 Tabs by Mouth Twice Daily.     . ondansetron  (ZOFRAN ) 4 mg PO ODT. Take 1 Tab by Mouth Every 8 Hours As Needed (PRN FOR NAUSEA AND VOMITING). ALLOW TABLET TO DISSOLVE ON TONGUE. 12 Tab 0   . pantoprazole  (PROTONIX ) 40 mg PO TBEC TAKE 1 TABLET BY MOUTH TWICE A DAY 180 Tab 1   . potassium chloride  ER (K-DUR;KLOR-CON  M) 10 mEq PO tablet,extended release (part/cryst) Take 1 Tab by Mouth 3 times a day.     . promethazine  (PHENERGAN ) 25 mg PO TABS Take 1 Tab by Mouth Take As Needed.     . QUEtiapine  SR (SEROQUEL  XR) 50 mg PO TB24 Take 1 Tab by Mouth Every 24 Hours. Indications: manic-depression     . rOPINIRole  (REQUIP ) 0.5 mg PO TABS TAKE 2 TABS BY MOUTH EVERY NIGHT AT BEDTIME. 180 Tab 1   . torsemide (DEMADEX) 20 mg PO TABS Take 1 Tab by Mouth Once a Day. 30 Tab 0   . traMADoL  (ULTRAM ) 50 mg PO TABS Take 1 Tab by Mouth 2 Times Daily As Needed. for pain 60 Tab 0   . 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 Montelukast rash/itching and swelling   . Nitroglycerin hives and rash/itching   . Immune Globulin  (Human) (Igg) swelling and rash/itching     Pt complain of itchiness and swelling of lips and tongue. Saturation fine, bp elevated.   . Magnesium  Sulfate rash/itching     Can take magnesium  supplement   . Amoxicillin hives     Has tolerated ceftriaxone, cephalexin   . Atorvastatin  unknown and other/intolerance     Rhabdomyolysis   Pt states she thinks it caused her to have rhabdo   . Bumex [Bumetanide] muscle/joint pain   . Ceftriaxone rash/itching   .  Magnesium  Chloride rash/itching   . Sulfamethoxazole-Trimethoprim rash/itching   . Iodinated Contrast Media swelling     Throat swelling and lip itching   . Penicillins hives     Has tolerated cephalexin   . Tetracyclines hives   . Latex, Natural Rubber rash/itching and hives   . Kit Prep Of Tc-64m-Tetrofosmin unknown       Vital Signs:  Patient Vitals for the past 72 hrs:   Temp Heart Rate Pulse Resp BP BP Mean SpO2 Weight   12/16/23 0837 98.7 F (37.1 C) 95 95 20 137/62 76 MM HG 97 % 90.7 kg (200 lb)       Diagnostics:  Labs:  No results found for this visit on 12/16/23.  ECG:  No results found for this visit on 12/16/23.         Medications ordered/given in the ED  Medications   ondansetron  (Zofran ) tablet, rapid dissolve 4 mg (4 mg Oral Given 12/16/23 0855)   loperamide (Imodium) capsule 4 mg (4 mg Oral Given 12/16/23 0855)

## 2023-12-18 NOTE — Unmapped External Note (Signed)
 Ambulatory RN Care Manager   ED/Hospital Discharge   Transition of Care Note / Initial Assessment    [name] is a 68 y.o. female patient.    PCP: [name], MD  Physician practice: [facility]    ED/Hospital Transition of Care Call:    HIPPA information verified:  Address and DOB  Previous hospital admission in the last 30 days?: yes (6 hour admission 11/30/23)  Discharge from:  ED  Discharge Dx:  Diarrhea and left leg pain  Admit date:  12/16/23  D/C date:  12/16/23  Patient stated reason for visit:  Body aches, N/V, and leg pain  What symptoms would cause you to return to the ED:  Body aches, N/V, and leg pain getting worse  Assessment completed with:  Patient  On a scale of 1-10, what is the patient's and/or caregiver's confidence in their ability to manage care independently?:  7  Patient currently relies on the following individual(s) to assist with care:  Daughters(s), self and grandchild(ren)  Diabetes: yes       Home blood tests:  1-2 x per day       Blood glucose ranges:  110-130  CHF: yes       Daily weight checked: yes       Weight trend:  Stable  COPD: yes  Pneumonia: no  Sepsis: no  AMI: no  Allergies reviewed: Yes  Medications reviewed: Yes  Patient/Caregiver voiced understanding of medications: Yes  Prescriptions filled and taken as prescribed: Yes  Health Maintenance reviewed: no  Discharge instructions reviewed: yes  Arranged CSB/BH: no  Arranged Meals on wheels: no  Arranged Home Health services: no    Arranged PT/OT/ST: no    Other(specialist,etc.) schedules: no  OP diagnostics test/procedures ordered: no  TOC follow-up appointment scheduled (MD name/date):  Office will contact patient  Patient has transportation to the appointment: yes    Assessment:  12/01/23: [facility] ED (admit for 6 hours) for chest pain and left facial droop  12/16/23: [facility] ED for diarrhea and left leg pain  Patient stated N/V as well with aches and pain  Adult grandchildren will be present in home while daughter is  away ([place]).  Facial droop has resolved   Breathing doing ok; has the most trouble at night; prefers to sleep elevated  BG: 121   Swelling in lower extremities is stable; states weight is stable at 200 lbs.   Still having some nausea and took promethazine  as she has not had the opportunity to get the Zofran  or imodium from the pharmacy (planning to pick up after her appointment on 2/26).     Constitutional: Negative.    Skin: Negative.    HENT: Negative.     Eyes: Negative.    Cardiovascular:  Positive for leg swelling.   Respiratory:  Positive for shortness of breath (at night when trying to lay flat).    Gastrointestinal:  Positive for nausea.   Endocrine: Negative.   Genitourinary: Negative.    Musculoskeletal:  Positive for myalgias, arthralgias and joint tenderness.   Hematologic: Negative.    Allergy/Immuno: Negative.  Neurological: Negative.   Psychiatric/Behavioral:  Positive for sleep disturbance.      Education/Interventions provided:  Provided Nurse Advise Line contact information  Advised patient that office would contact her to schedule follow up appointment   Patient had no further concerns or questions at conclusion of telephone interaction.     * Coordinate/Collaborate care with PCP or health care  providers (appointments and follow up care, transitional planning, etc.)    * Provide patient with Care Manager contact information    * Educate patient regarding when to use Urgent Care, the availability of same day visits with their PCP, and the nurse triage line as alternatives to ED visits    * Educate patient regarding importance of keeping regularly scheduled appointments with PCP and specialists    * Educate patient on the benefits of the Transition Visit Program and how it works Patient currently active in CCM     Follow-up and Plan:  Call and monitor weekly and PRN for 30 days.        Active Goals          Patient Goals    1. Cope with Chronic Pain      Follow Up Date 01/08/2024     - learn relaxation  techniques  - practice acceptance of chronic pain  - tell myself I can (not I can't)  - use relaxation during pain      Why is this important?    Feelings like depression, anxiety, stress and anger can make your body more sensitive to pain.   Learning ways to cope with feelings may help you find some relief from the pain.      Notes:       2. Manage Pain      Follow Up Date: 01/08/2024       - call for medicine refill 2 or 3 days before it runs out  - develop a personal pain management plan  - keep track of prescription refills  - prioritize tasks for the day  - track times pain is worst and when it is best  - track what makes the pain worse and what makes it better  - use cold or heat for pain relief      Why is this important?    Day-to-day life can be hard when you have chronic pain.   Pain medicine is just one option to manage pain.    You can try these action steps to help you manage your pain.      Notes:       3. Track and Manage My Blood Pressure      Follow up date 01/08/2024     - check blood pressure daily  - choose a place to take my blood pressure (home, clinic or office, retail store)  - write blood pressure results in a log or diary      Why is this important?    You won't feel high blood pressure, but it can still hurt your blood vessels.   High blood pressure can cause heart or kidney problems. It can also cause a stroke.   Making lifestyle changes like losing a little weight or eating less salt will help.   Checking your blood pressure at home and at different times of the day can help to control blood pressure.   If the doctor prescribes medicine remember to take it the way the doctor ordered.   Call the office if you cannot afford the medicine or if there are questions about it.       Notes:               Home Medication List - Marked as Reviewed on 12/16/23 0834   Medication Sig   Ammonium Lactate 12 % Top CREA PLEASE SEE ATTACHED FOR DETAILED DIRECTIONS   apixaban  (ELIQUIS ) 5 mg  PO TABS Take 1 Tab  by Mouth Twice Daily.   dapagliflozin propanediol (FARXIGA) 10 mg PO TABS Take 1 Tab by Mouth Once a Day.   diazePAM  (VALIUM ) 5 mg PO TABS Take 1 Tab by Mouth 2 (two) times a day.   EPINEPHrine (EPIPEN) 0.3 mg/0.3 mL Inj AtIn Inject 0.3 mL into the muscle Take As Needed for Other (ANAPHYLAXIS). Q 10-30 minutes prn for ANAPHYLAXIS ONLY   gabapentin  (NEURONTIN ) 100 mg PO CAPS Take 1 Cap by Mouth 3 Times Daily.   lamoTRIgine  (LAMICTAL ) 100 mg PO TABS Take 1 Tab by Mouth Once a Day. Indications: epilepsy   Loperamide 2 mg PO TABS Take 1 Tab by Mouth Every 4 Hours As Needed (loose stools). Not to exceed 8 tablets in 24 hours.  Patient not taking: Reported on 12/18/2023   lurasidone  (LATUDA ) 40 mg PO TABS TAKE 1 TABLET DAILY WITH FOOD (AT LEAST 350 CALORIES)   MAGNESIUM  GLYCINATE PO Take 100 mg by Mouth Once a Day. Indications: patient states she takes this over the counter   metoprolol  XL (TOPROL  XL) 25 mg PO TB24 Take 1 Tab by Mouth Once a Day.   Mycophenolate  Sodium 360 mg PO TBEC Take 4 Tabs by Mouth Twice Daily.   ondansetron  (ZOFRAN ) 4 mg PO ODT. Take 1 Tab by Mouth Every 8 Hours As Needed (PRN FOR NAUSEA AND VOMITING). ALLOW TABLET TO DISSOLVE ON TONGUE.  Patient not taking: Reported on 12/18/2023   pantoprazole  (PROTONIX ) 40 mg PO TBEC TAKE 1 TABLET BY MOUTH TWICE A DAY   potassium chloride  ER (K-DUR;KLOR-CON  M) 10 mEq PO tablet,extended release (part/cryst) Take 1 Tab by Mouth 3 times a day.   promethazine  (PHENERGAN ) 25 mg PO TABS Take 1 Tab by Mouth Take As Needed.   QUEtiapine  SR (SEROQUEL  XR) 50 mg PO TB24 Take 1 Tab by Mouth Every 24 Hours. Indications: manic-depression   rOPINIRole  (REQUIP ) 0.5 mg PO TABS TAKE 2 TABS BY MOUTH EVERY NIGHT AT BEDTIME.   torsemide (DEMADEX) 20 mg PO TABS Take 1 Tab by Mouth Once a Day.   traMADoL  (ULTRAM ) 50 mg PO TABS Take 1 Tab by Mouth 2 Times Daily As Needed. for pain   TYPE-IN DME 4 PRONG CANE - Dx: G95.89, R26.9       Past Medical History:   Diagnosis Date   . Acute, but  ill-defined, cerebrovascular disease     TIA   . Arthropathy     knees   . Asthma    . Bipolar disorder (HCC)    . Cardiac arrhythmia     h/o Afib on Eliquis    . Cerebral artery occlusion with cerebral infarction [facility])     CVA post endodartorectomy   . Chest pain     has history multiple admissions for this, stress test and cardiac cath negative   . Chronic anemia     Hgb ~10   . Chronic obstructive pulmonary disease (HCC) 01/19/2017   . COPD (chronic obstructive pulmonary disease) (HCC)    . Cough    . COVID    . COVID-19 vaccine series completed    . CVA (cerebral vascular accident) (HCC) 09/01/2021   . Deficiency anemia 11/2020    rx with Iron Infusions   . Epilepsy (HCC)     per pt last Sz was approx 9-10 months ago   . Esophageal reflux    . Headache    . Hemiplegia and hemiparesis following cerebral infarction affecting left non-dominant side (HCC)  01/22/2020   . Hemorrhoids    . History of blood transfusion     following one of her pregnancy   . History of cardiac catheterization no obstructive cad in 2017    . Hyperlipidemia    . Hyperlipidemia    . Hypertension    . Lower leg edema    . Migraines    . Paroxysmal atrial fibrillation (HCC)     on anticoagulation   . Seizure-like activity (HCC)     EEG negative   . Stable angina pectoris (HCC) 02/15/2018   . Stiff person syndrome    . Syncope     recurrent, has loop recorder   . Thoracic aortic aneurysm (HCC)    . TIA (transient ischemic attack)     multiple since age 34's per patient. Multiple MRI brain in the past negative for evidence of stroke. Suspected complicated migraine.   . Type 2 diabetes mellitus (HCC)     not on medication   . Unable to coordinate sucking, swallowing, and breathing 09/28/2019       Patient Care Team:  Trudy Mina, MD as PCP - General (Family Practice)  Read Papas, MD as Cardiologist (Cardiology)  Chan, Farn, MD as Hematologist (Hematology and Oncology)  Josepha Marinell BROCKS, MD as Gastroenterologist (Gastroenterology)  Dr.  Tex as Psychiatrist (Psychiatry)  Josephus Borg, NP as Urologist (Urology)  Delores Aubry HERO, RN as RN (Integrated Care Manager)    Aubry Jama Delores, BSN, RN, CNOR  RN Integrated Care Manager  [facility]  [phone]

## 2023-12-19 NOTE — Progress Notes (Signed)
 Subjective  [name] is a 68 y.o. female who presents for Palliative Care Follow-up.    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 Since her last visit she was admitted 2/7-2/9  for facial droop and CP, possible TIA/inconclusive and CP found to be non cardiac. Pt was then seen in ER 2/22 viral Gastroenteritis,symptoms resolved. Pt reports she is at baseline today and reports she is doing well at the time of visit. She continues to be followed by pain management and reports 6/10 pain to BLE. She was to start outpatient PT to address pain, balance, flexibility and strength but is wait listed. She denies recent falls and continues to ambulate with a cane when she leaves home. Pertinent medical hx includes CHF, Afib, HTN and Previous CVA s/p endarterectomy,      Review of Systems   Constitutional:  Negative for activity change, appetite change, fatigue, fever and unexpected weight change.   Respiratory:  Negative for cough and shortness of breath.    Cardiovascular:  Positive for leg swelling. Negative for chest pain.   Gastrointestinal:  Negative for abdominal pain, constipation, diarrhea, nausea and vomiting.   Genitourinary:  Negative for difficulty urinating.   Musculoskeletal:  Positive for gait problem and myalgias. Negative for back pain and joint swelling.   Skin:  Negative for rash and wound.   Neurological:  Positive for weakness. Negative for dizziness and syncope.   Psychiatric/Behavioral:  Negative for dysphoric mood and sleep disturbance. The patient is not nervous/anxious.      [place] Symptom Assessment System  Pain Score (If > or = 3, complete the Pain Assessment): 6  Tiredness Score: Not tired  Nausea Score: Not nauseated  Depression Score: Not depressed  Anxiety Score: Not anxious  Drowsiness Score: Not drowsy  Appetite Score: Best appetite  Wellbeing Score: Best feeling of wellbeing  Dyspnea Score: No shortness of breath    Patient Active Problem List    Diagnosis Date Noted   .  Atrial fibrillation (HCC) 09/12/2023   . Pain, chronic 09/12/2023   . HTN (hypertension) 09/12/2023   . Epilepsy (HCC) 09/12/2023   . Debility 09/12/2023   . At risk for falling 09/12/2023   . Dysphagia 09/12/2023   . COPD (chronic obstructive pulmonary disease) (HCC) 09/12/2023   . Congestive heart failure (CHF) (HCC) 09/12/2023     Past Surgical History:   Procedure Laterality Date   . CT ANGIOGRAM CHEST  09/27/2018    CT ANGIOGRAM CHEST 09/27/2018   . CT ANGIOGRAM CHEST  02/05/2017    CT ANGIOGRAM CHEST 02/05/2017   . CT ANGIOGRAM CHEST  01/16/2017    CT ANGIOGRAM CHEST 01/16/2017   . CT ANGIOGRAM CHEST  12/23/2013    CT ANGIOGRAM CHEST 12/23/2013   . MR ANGIOGRAM HEAD WO IV CONTRAST  06/16/2019    MR ANGIOGRAM HEAD WO IV CONTRAST 06/16/2019   . MR ANGIOGRAM HEAD WO IV CONTRAST  12/01/2015    MR ANGIOGRAM HEAD WO IV CONTRAST 12/01/2015   . MR ANGIOGRAM HEAD WO IV CONTRAST  05/28/2014    MR ANGIOGRAM HEAD WO IV CONTRAST 05/28/2014   . MR ANGIOGRAM NECK W AND WO IV CONTRAST  06/16/2019    MR ANGIOGRAM NECK W AND WO IV CONTRAST 06/16/2019     Current Outpatient Medications   Medication Instructions   . apixaban  (Eliquis ) 5 MG tablet Oral   . dapagliflozin (FARXIGA) 10 mg, Oral, Daily RT   . diazePAM  (Valium ) 5 MG  tablet Oral   . diclofenac sodium 1 % gel Topical   . gabapentin  (Neurontin ) 100 MG capsule Oral   . hyoscyamine (Anaspaz,Levsin) 0.125 MG tablet Oral   . lamoTRIgine  (LAMICTAL ) 100 mg, Oral, Daily   . lurasidone  (LATUDA ) 120 mg, Oral, Every evening   . metoprolol  succinate XL (TOPROL -XL) 25 mg, Oral, Daily RT   . Mycophenolic Acid  360 mg, Oral, Daily   . pantoprazole  (PROTONIX ) 40 mg, Oral   . potassium chloride  CR (Klor-Con ) 10 MEQ ER tablet 10 mEq, Oral   . promethazine  (Phenergan ) 25 MG tablet Oral   . QUEtiapine  (SEROQUEL ) 100 mg, Oral, Nightly   . rOPINIRole  (Requip ) 0.5 MG tablet Oral   . torsemide (Demadex) 20 MG tablet Oral   . traMADol  (ULTRAM ) 50 mg, Oral     Social Determinants of Health with Concerns     Physical  Activity: Insufficiently Active (07/28/2023)    Received from [facility]    Exercise Vital Sign    . Days of Exercise per Week: 2 days    . Minutes of Exercise per Session: 10 min   Stress: Stress Concern Present (07/28/2023)    Received from [facility] - Occupational Stress Questionnaire    . Feeling of Stress : To some extent             Objective  Blood pressure 128/80, pulse 84, temperature 36.6 C (97.9 F), temperature source Temporal, height 1.651 m (5' 5), weight 200 lb (90.7 kg), SpO2 99%.  Oxygen Therapy: None (Room air)  Physical Exam  Constitutional:       General: She is not in acute distress.     Appearance: Normal appearance. She is normal weight. She is not ill-appearing or diaphoretic.   HENT:      Head: Normocephalic and atraumatic.      Mouth/Throat:      Mouth: Mucous membranes are moist.   Eyes:      Conjunctiva/sclera: Conjunctivae normal.   Cardiovascular:      Rate and Rhythm: Normal rate and regular rhythm.      Pulses: Normal pulses.      Heart sounds: Normal heart sounds.   Pulmonary:      Effort: Pulmonary effort is normal. No respiratory distress.      Breath sounds: Normal breath sounds.   Abdominal:      General: Abdomen is flat. Bowel sounds are normal.      Palpations: Abdomen is soft.   Musculoskeletal:      Cervical back: Neck supple.      Right lower leg: Edema present.      Left lower leg: Edema present.   Skin:     General: Skin is warm and dry.   Neurological:      General: No focal deficit present.      Mental Status: She is alert and oriented to person, place, and time. Mental status is at baseline.   Psychiatric:         Mood and Affect: Mood normal.         Behavior: Behavior normal.         Thought Content: Thought content normal.         Judgment: Judgment normal.         PPS Score: 70%  Morse Fall Risk Score: 30  ADL Screening  Dressing: Independent  Feeding: Independent  Bathing: Independent  Toileting:  Independent  In/Out Bed: Independent  Walks in Home: Independent    Advance Care Planning  Advance Directive on file?: <no information>  Advance Directive Reviewed? Yes  ACP Discussion  What are your most important goals if your health situation worsens?: being comfortable, living as long as possible, being independent  Comments: Daughter is health care advocate. Patient prefers all life prolonging interventions, reduced symptoms burden, to maintain as much independence as possible.    GOC conversation initiated, POST form reviewed. Patient would like to discuss with daughter.  06/28/2023 Confirmed existing GOC and discussed ACP completion  10/3- Patient wants all medical interventions if needed. Her daughter is aware.             Assessment/Plan  1. Chronic congestive heart failure, unspecified heart failure type (HCC) (Primary)  Overview:  Cardiology - Dr. Read; NYHA Class II  Assessment & Plan:  Pt with recent admission for CP found to be non cardiac. Pt denies CP today, no diaphoresis, orthopnea. DOE and pedal edema at baseline, weight stable.  Condition: stable  Weight Gain: No  Dyspnea: Yes  Edema:+1  Medication Adherences: Yes    Plan of Care:  Continue current medications and routine cardiology follow up, Advised on low sodium diet and regular weight monitoring, and [facility] 24/7 hr phone number and to call with questions and concerns.    Education:  It is important to proactively avoid and address congestive heart failure exacerbation. If you are experiencing any of the following please call [facility], Palliative Care: Weight increases 2lbs overnight or 5 lbs from baseline; increasing shortness of breath; and increased swelling.      2. Atrial fibrillation, unspecified type (HCC)  Overview:  Cardiology - Dr. Read; [facility] Eliquis , Rate control Metoprolol   Assessment & Plan:  Pt denies palpitations, dizziness, syncope, bleeding    Continue current medications and follow up with Cardiology as  directed,  Educated on increased risk of bleeding with Eliquis , falls precautions in place  Encouraged to call [facility] 24/7 phone number for new or worsening symptoms.    3. Other chronic pain  Overview:  [facility]; Notes:    Assessment & Plan:  Pt followed by pain management, reorts pain is constant but can fluctuate in severity, satisfied with pain management at this time. Denies constipation.  Provocation: Chronic  Quality: heavy   Region: BLE  Severity: 6  Timing: constant  Aggravating Factors: unable to verbalize  Alleviating Factors: medication, rest, elevation  Medications Used/Helpful: yes    Plan of Care:  Discussed how and when pain medications are to be used to improve understanding of pain management.  Discussed non pharmacological pain relief options    Discussed side effects of pain medications and interventions to manage including prevention of constipation  Continue follow up with pain management as directed    Patient Education:  Please call [facility], Palliative Care if you are experiencing any of the following: increasing pain, intolerance to medications, decreased mobility, worsening generalized weakness, or other concerns.         Other orders  -     Follow Up In Palliative Medicine  -     Follow Up In Palliative Medicine; Future        Palliative Care Follow-up Expected on   Palliative Care Follow-up Expected on 01/19/2024     Moon, Kristin, NP

## 2023-12-25 NOTE — Unmapped External Note (Signed)
 [facility]  Integrated Care Management  [facility]  [name], MD    ED Follow up Week 2    [name] is a 68 y.o. female patient.    Complex Chronic Care Management Coding Initiation - Medicare FFS Only    [name]  [birth year]    This patient has agreed and consented verbally to Chronic Care Management (CCM) services for management of two or more chronic conditions: List all applicable conditionsHTN, CHF, TIA, COPD, DM  One of which is uncontrolled: List conditionHTN. The patient meets 2 of the 3 moderate high risk decision making elements.    Patient is aware that services can be discontinued at any time, and that there is a monthly copayment required for the service.    Chronic Care Management Patient Tracker    Date/Time of Contact for Enrollment 05/03/2021  3:27 PM     Method of Contact Phone     Patient Response interested     Enrollment Status currently enrolled     Enrolling Provider Pouch Waynard, MD     Chronic Care Mgmt Time Spent with Patient     Time spent with patient (minutes): 25     AMB CM CONT FOLLOW-UP:   Assessment completed with:  Patient    HIPPA information verified:  Address and DOB      Visit Location:  Telephonic    Purpose of call:  ED visit and uncontrolled disease state  Insurance:  Medicare  Psychosocial:  Caregiver available, cognitive limitations and independent  Medication review:  Adherence or non-adherence    General Interventions:  Provide patient with Care Manager contact information and Educate patient regarding when to use Urgent Care, the availability of same day visits with their PCP, and the nurse triage line as alternatives to ED visits    Closing Gaps:  No    Does patient have any barriers to medication compliance?:  None  Community Resources:  No    Durable Medical Equipment:  Youth Worker  Education:  Disease process  Teaching recipient:  Patient  Teachback:  Fair  Advanced Directive:  Accepted info and explanation given    Plan:   Continue to follow in case management        Time spent:  < 60 minutes    Assessment/Education:  Feeling Ok today   Swelling in bilateral LE stable   BG/BP: has not performed yet today - ICM RN stressed importance of monitoring as part of the start of the day  All N/V/D resolved (from recent ED visit)  Weight at 199lbs (stable)  Confirmed patient is taking 160mg  latuda  daily.   Requested office mail ACP booklet to patients home  Patient had no further concerns or questions at conclusion of telephone interaction.       Constitutional: Negative.    Skin: Negative.    HENT: Negative.     Eyes: Negative.    Cardiovascular:  Positive for leg swelling.   Respiratory: Negative.     Gastrointestinal: Negative.    Endocrine: Negative.   Genitourinary: Negative.    Musculoskeletal: Negative.    Hematologic: Negative.    Allergy/Immuno: Negative.  Neurological: Positive for headaches (believes this is from medications).  Psychiatric/Behavioral: Negative.       Advance Care Planning    Date: 12/25/2023  [name]  PCP: [name], MD  Location:   [facility] CARE COORDINATION  [facility] CARE COORDINATION  [address]  [place]  [place] [address]  Dept: [phone]  Loc: [phone]      Documentation:   Advance Care Planning (ACP) is an important component of quality healthcare.    Explanation of the purpose of Advance Care Planning has been discussed with Patient    I contacted the patient using: by telephone    The following materials were given: ACP Packet    Documentation in EMR:   Advance Care Plan: is not on file.    Reviewed current ACP: Not applicable  Interviewer: Aubry CHRISTELLA Daring, RN             Active Goals          Patient Goals    1. Cope with Chronic Pain      Follow Up Date 01/08/2024     - learn relaxation techniques  - practice acceptance of chronic pain  - tell myself I can (not I can't)  - use relaxation during pain      Why is this important?    Feelings like depression, anxiety, stress and anger can  make your body more sensitive to pain.   Learning ways to cope with feelings may help you find some relief from the pain.      Notes:       2. Manage Pain      Follow Up Date: 01/08/2024       - call for medicine refill 2 or 3 days before it runs out  - develop a personal pain management plan  - keep track of prescription refills  - prioritize tasks for the day  - track times pain is worst and when it is best  - track what makes the pain worse and what makes it better  - use cold or heat for pain relief      Why is this important?    Day-to-day life can be hard when you have chronic pain.   Pain medicine is just one option to manage pain.    You can try these action steps to help you manage your pain.      Notes:       3. Track and Manage My Blood Pressure      Follow up date 01/08/2024     - check blood pressure daily  - choose a place to take my blood pressure (home, clinic or office, retail store)  - write blood pressure results in a log or diary      Why is this important?    You won't feel high blood pressure, but it can still hurt your blood vessels.   High blood pressure can cause heart or kidney problems. It can also cause a stroke.   Making lifestyle changes like losing a little weight or eating less salt will help.   Checking your blood pressure at home and at different times of the day can help to control blood pressure.   If the doctor prescribes medicine remember to take it the way the doctor ordered.   Call the office if you cannot afford the medicine or if there are questions about it.       Notes:               Home Medication List - Marked as Reviewed on 12/25/23 1047   Medication Sig   Ammonium Lactate 12 % Top CREA PLEASE SEE ATTACHED FOR DETAILED DIRECTIONS   apixaban  (ELIQUIS ) 5 mg PO TABS Take 1 Tab by Mouth Twice Daily.  dapagliflozin propanediol (FARXIGA) 10 mg PO TABS Take 1 Tab by Mouth Once a Day.   diazePAM  (VALIUM ) 5 mg PO TABS Take 1 Tab by Mouth 2 (two) times a day.   EPINEPHrine (EPIPEN)  0.3 mg/0.3 mL Inj AtIn Inject 0.3 mL into the muscle Take As Needed for Other (ANAPHYLAXIS). Q 10-30 minutes prn for ANAPHYLAXIS ONLY   gabapentin  (NEURONTIN ) 100 mg PO CAPS Take 1 Cap by Mouth 3 Times Daily.   lamoTRIgine  (LAMICTAL ) 100 mg PO TABS Take 1 Tab by Mouth Once a Day. Indications: epilepsy   Loperamide 2 mg PO TABS Take 1 Tab by Mouth Every 4 Hours As Needed (loose stools). Not to exceed 8 tablets in 24 hours.  Patient not taking: Reported on 12/25/2023   lurasidone  (LATUDA ) 40 mg PO TABS TAKE 1 TABLET DAILY WITH FOOD (AT LEAST 350 CALORIES)   MAGNESIUM  GLYCINATE PO Take 100 mg by Mouth Once a Day. Indications: patient states she takes this over the counter   metoprolol  XL (TOPROL  XL) 25 mg PO TB24 Take 1 Tab by Mouth Once a Day.   Mycophenolate  Sodium 360 mg PO TBEC Take 4 Tabs by Mouth Twice Daily.   ondansetron  (ZOFRAN ) 4 mg PO ODT. Take 1 Tab by Mouth Every 8 Hours As Needed (PRN FOR NAUSEA AND VOMITING). ALLOW TABLET TO DISSOLVE ON TONGUE.   pantoprazole  (PROTONIX ) 40 mg PO TBEC TAKE 1 TABLET BY MOUTH TWICE A DAY   potassium chloride  ER (K-DUR;KLOR-CON  M) 10 mEq PO tablet,extended release (part/cryst) Take 1 Tab by Mouth 3 times a day.   promethazine  (PHENERGAN ) 25 mg PO TABS Take 1 Tab by Mouth Take As Needed.   QUEtiapine  SR (SEROQUEL  XR) 50 mg PO TB24 Take 1 Tab by Mouth Every 24 Hours. Indications: manic-depression   rOPINIRole  (REQUIP ) 0.5 mg PO TABS TAKE 2 TABS BY MOUTH EVERY NIGHT AT BEDTIME.   torsemide (DEMADEX) 20 mg PO TABS Take 1 Tab by Mouth Once a Day.   traMADoL  (ULTRAM ) 50 mg PO TABS Take 1 Tab by Mouth 2 Times Daily As Needed. 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 Essential hypertension   . Paroxysmal atrial fibrillation (HCC)   . Bipolar 1 disorder (HCC)   . Iron deficiency anemia due to chronic blood loss   . Hypokalemia   . Pure hypercholesterolemia   . Gastroesophageal reflux disease without esophagitis   .  Chronic headache   . History of nuclear stress test in 05/2108   . History of cardiac catheterization no obstructive cad in 2017    . Transient neurologic deficit   . TIA (transient ischemic attack)   . Abnormal EKG   . Vertigo   . Slurred speech   . Headache   . Weakness of left upper extremity   . Progressive focal motor weakness   . History of cardiac cath no obstructive cad in cath in 2014 and 2017   . Hx of TIA (transient ischemic attack) and stroke 08/2016   . Right sided weakness   . Left-sided weakness   . Seizure (HCC)   . Chronic anemia   . Bleeding per rectum   . Syncope   . Iron deficiency anemia, unspecified   . Migraine   . Facial droop   . Left hand paresthesia   . Fall   . Unable to coordinate sucking, swallowing, and breathing   .  Left sided numbness   . Chronic diastolic (congestive) heart failure (HCC)   . Acute focal neurological deficit   . Intractable headache   . Facial paresthesia   . Non-traumatic rhabdomyolysis   . Paresthesia   . Acute encephalopathy   . Acute on chronic anemia   . UTI (urinary tract infection)   . Suprapubic catheter dysfunction, initial encounter (HCC)   . Malfunction of indwelling urinary catheter (HCC)   . Syncope and collapse   . Neuropathy   . Neurogenic bladder   . Diastolic heart failure (HCC)   . Vitamin B6 induced neuropathy (HCC)   . Chest pain       Past Medical History:   Diagnosis Date   . Acute, but ill-defined, cerebrovascular disease     TIA   . Arthropathy     knees   . Asthma    . Bipolar disorder (HCC)    . Cardiac arrhythmia     h/o Afib on Eliquis    . 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 Chest pain     has history multiple admissions for this, stress test and cardiac cath negative   . Chronic anemia     Hgb ~10   . Chronic obstructive pulmonary disease (HCC) 01/19/2017   . COPD (chronic obstructive pulmonary disease) (HCC)    . Cough    . COVID    . COVID-19 vaccine series completed    . CVA  (cerebral vascular accident) (HCC) 09/01/2021   . Deficiency anemia 11/2020    rx with Iron Infusions   . Epilepsy (HCC)     per pt last Sz was approx 9-10 months ago   . Esophageal reflux    . Headache    . 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 Hemorrhoids    . History of blood transfusion     following one of her pregnancy   . History of cardiac catheterization no obstructive cad in 2017    . Hyperlipidemia    . Hyperlipidemia    . Hypertension    . Lower leg edema    . Migraines    . Paroxysmal atrial fibrillation (HCC)     on anticoagulation   . Seizure-like activity (HCC)     EEG negative   . Stable angina pectoris (HCC) 02/15/2018   . Stiff person syndrome    . Syncope     recurrent, has loop recorder   . Thoracic aortic aneurysm (HCC)    . TIA (transient ischemic attack)     multiple since age 84's per patient. Multiple MRI brain in the past negative for evidence of stroke. Suspected complicated migraine.   . Type 2 diabetes mellitus (HCC)     not on medication   . Unable to coordinate sucking, swallowing, and breathing 09/28/2019       Patient Care Team:  Trudy Mina, MD as PCP - General (Family Practice)  Read Papas, MD as Cardiologist (Cardiology)  Chan, Farn, MD as Hematologist (Hematology and Oncology)  Josepha Marinell BROCKS, MD as Gastroenterologist (Gastroenterology)  Dr. Tex as Psychiatrist (Psychiatry)  Josephus Borg, NP as Urologist (Urology)  Delores Aubry HERO, RN as RN (Integrated Care Manager)    Aubry Jama Delores, BSN, RN, CNOR  RN Integrated Care Manager  [facility]  [phone]

## 2024-01-08 NOTE — Unmapped External Note (Signed)
 Ambulatory RN Care Manager  Follow-Up Note      [name] is a 68 y.o. female patient.    Complex Chronic Care Management Coding Initiation - Medicare FFS Only    [name]  [birth year]    This patient has agreed and consented verbally to Chronic Care Management (CCM) services for management of two or more chronic conditions: List all applicable conditionsHTN, CHF, TIA, COPD, DM   One of which is uncontrolled: List conditionHTN. The patient meets 2 of the 3 moderate high risk decision making elements.    Patient is aware that services can be discontinued at any time, and that there is a monthly copayment required for the service.    Chronic Care Management Patient Tracker    Date/Time of Contact for Enrollment 05/03/2021  3:27 PM     Method of Contact Phone     Patient Response interested     Enrollment Status currently enrolled     Enrolling Provider Trudy Mina, MD     Chronic Care Mgmt Time Spent with Patient     Time spent with patient (minutes): 30     AMB CM CONT FOLLOW-UP:   Assessment completed with:  Patient    HIPPA information verified:  Address and DOB      Visit Location:  Telephonic    Purpose of call:  Uncontrolled disease state  Insurance:  Other  Psychosocial:  Caregiver available, independent and cognitive limitations  Medication review:  Adherence or non-adherence    General Interventions:  Educate patient regarding when to use Urgent Care, the availability of same day visits with their PCP, and the nurse triage line as alternatives to ED visits    Does patient have any barriers to medication compliance?:  None  Education:  Signs/symptoms and other  Teaching recipient:  Patient  Teachback:  Fair  Plan:  Continue to follow in case management        Time spent:  < 60 minutes  Assessment:  Feeling well overall   BP: 80/54;  HR 68 this morning when she woke up (no complains of headache, dizziness) advised patient to continue monitoring and if persistent lows contact PCP office.  Patient  states she is normally 120s/60-70s  While on the phone BP: 105/62; HR 73  Believes one of her medicines, mycophenolate , is causing suppressed appetite; advised her to contact the pharmacist to determine drug interactions  Weight: 199 lbs. (Stable)  Pain - everything going well  Requesting recommendation for specialist due to bilateral lower extremity numbness and sensation of walking on marbles  BG: 125 (current A1c 6.1)    Constitutional: Negative.    Skin: Negative.    HENT: Negative.     Eyes: Negative.    Cardiovascular:  Positive for leg swelling (stable and states not a lot).   Gastrointestinal: Negative.    Endocrine: Negative.   Genitourinary: Negative.    Musculoskeletal:         Feels like she is walking on marbles and bilateral foot numbness  Hematologic: Negative.    Allergy/Immuno: Negative.  Neurological: Negative for dizziness and headaches.  Psychiatric/Behavioral:  Positive for sleep disturbance (going to bed a bit earlier and waking earlier).      Education/Interventions/Plan:  Discussed importance of journaling BP readings to determine trends  Will notify her of specialist recommendation and continue outreach as necessary.   No further questions or concerns at conclusion of conversation.         Active  Goals          Patient Goals    1. Cope with Chronic Pain      Follow Up Date 02/08/2024     - learn relaxation techniques  - practice acceptance of chronic pain  - tell myself I can (not I can't)  - use relaxation during pain      Why is this important?    Feelings like depression, anxiety, stress and anger can make your body more sensitive to pain.   Learning ways to cope with feelings may help you find some relief from the pain.      Notes:       2. Manage Pain      Follow Up Date: 02/08/2024       - call for medicine refill 2 or 3 days before it runs out  - develop a personal pain management plan  - keep track of prescription refills  - prioritize tasks for the day  - track times pain is worst  and when it is best  - track what makes the pain worse and what makes it better  - use cold or heat for pain relief      Why is this important?    Day-to-day life can be hard when you have chronic pain.   Pain medicine is just one option to manage pain.    You can try these action steps to help you manage your pain.      Notes:       3. Track and Manage My Blood Pressure      Follow up date 02/08/2024     - check blood pressure daily  - choose a place to take my blood pressure (home, clinic or office, retail store)  - write blood pressure results in a log or diary      Why is this important?    You won't feel high blood pressure, but it can still hurt your blood vessels.   High blood pressure can cause heart or kidney problems. It can also cause a stroke.   Making lifestyle changes like losing a little weight or eating less salt will help.   Checking your blood pressure at home and at different times of the day can help to control blood pressure.   If the doctor prescribes medicine remember to take it the way the doctor ordered.   Call the office if you cannot afford the medicine or if there are questions about it.       Notes:   Utilizes wrist cuff              Home Medication List - Marked as Reviewed on 12/25/23 1047   Medication Sig   Ammonium Lactate 12 % Top CREA PLEASE SEE ATTACHED FOR DETAILED DIRECTIONS   apixaban  (ELIQUIS ) 5 mg PO TABS Take 1 Tab by Mouth Twice Daily.   dapagliflozin propanediol (FARXIGA) 10 mg PO TABS Take 1 Tab by Mouth Once a Day.   diazePAM  (VALIUM ) 5 mg PO TABS Take 1 Tab by Mouth 2 (two) times a day.   EPINEPHrine (EPIPEN) 0.3 mg/0.3 mL Inj AtIn Inject 0.3 mL into the muscle Take As Needed for Other (ANAPHYLAXIS). Q 10-30 minutes prn for ANAPHYLAXIS ONLY   gabapentin  (NEURONTIN ) 100 mg PO CAPS Take 1 Cap by Mouth 3 Times Daily.   lamoTRIgine  (LAMICTAL ) 100 mg PO TABS Take 1 Tab by Mouth Once a Day. Indications: epilepsy   Loperamide 2 mg PO  TABS Take 1 Tab by Mouth Every 4 Hours As  Needed (loose stools). Not to exceed 8 tablets in 24 hours.  Patient not taking: Reported on 12/25/2023   lurasidone  (LATUDA ) 40 mg PO TABS TAKE 1 TABLET DAILY WITH FOOD (AT LEAST 350 CALORIES)   MAGNESIUM  GLYCINATE PO Take 100 mg by Mouth Once a Day. Indications: patient states she takes this over the counter   metoprolol  XL (TOPROL  XL) 25 mg PO TB24 Take 1 Tab by Mouth Once a Day.   Mycophenolate  Sodium 360 mg PO TBEC Take 4 Tabs by Mouth Twice Daily.   ondansetron  (ZOFRAN ) 4 mg PO ODT. Take 1 Tab by Mouth Every 8 Hours As Needed (PRN FOR NAUSEA AND VOMITING). ALLOW TABLET TO DISSOLVE ON TONGUE.   pantoprazole  (PROTONIX ) 40 mg PO TBEC TAKE 1 TABLET BY MOUTH TWICE A DAY   potassium chloride  ER (K-DUR;KLOR-CON  M) 10 mEq PO tablet,extended release (part/cryst) Take 1 Tab by Mouth 3 times a day.   promethazine  (PHENERGAN ) 25 mg PO TABS Take 1 Tab by Mouth Take As Needed.   QUEtiapine  SR (SEROQUEL  XR) 50 mg PO TB24 Take 1 Tab by Mouth Every 24 Hours. Indications: manic-depression   rOPINIRole  (REQUIP ) 0.5 mg PO TABS TAKE 2 TABS BY MOUTH EVERY NIGHT AT BEDTIME.   torsemide (DEMADEX) 20 mg PO TABS Take 1 Tab by Mouth Once a Day.   traMADoL  (ULTRAM ) 50 mg PO TABS Take 1 Tab by Mouth 2 Times Daily As Needed. 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 Essential hypertension   . Paroxysmal atrial fibrillation (HCC)   . Bipolar 1 disorder (HCC)   . Iron deficiency anemia due to chronic blood loss   . Hypokalemia   . Pure hypercholesterolemia   . Gastroesophageal reflux disease without esophagitis   . Chronic headache   . History of nuclear stress test in 05/2108   . History of cardiac catheterization no obstructive cad in 2017    . Transient neurologic deficit   . TIA (transient ischemic attack)   . Abnormal EKG   . Vertigo   . Slurred speech   . Headache   . Weakness of left upper extremity   . Progressive focal motor weakness   . History of cardiac cath no  obstructive cad in cath in 2014 and 2017   . Hx of TIA (transient ischemic attack) and stroke 08/2016   . Right sided weakness   . Left-sided weakness   . Seizure (HCC)   . Chronic anemia   . Bleeding per rectum   . Syncope   . Iron deficiency anemia, unspecified   . Migraine   . Facial droop   . Left hand paresthesia   . Fall   . Unable to coordinate sucking, swallowing, and breathing   . Left sided numbness   . Chronic diastolic (congestive) heart failure (HCC)   . Acute focal neurological deficit   . Intractable headache   . Facial paresthesia   . Non-traumatic rhabdomyolysis   . Paresthesia   . Acute encephalopathy   . Acute on chronic anemia   . UTI (urinary tract infection)   . Suprapubic catheter dysfunction, initial encounter (HCC)   . Malfunction of indwelling urinary catheter (HCC)   . Syncope and collapse   . Neuropathy   . Neurogenic bladder   . Diastolic heart failure (HCC)   .  Vitamin B6 induced neuropathy (HCC)   . Chest pain       Past Medical History:   Diagnosis Date   . Acute, but ill-defined, cerebrovascular disease     TIA   . Arthropathy     knees   . Asthma    . Bipolar disorder (HCC)    . Cardiac arrhythmia     h/o Afib on Eliquis    . 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 Chest pain     has history multiple admissions for this, stress test and cardiac cath negative   . Chronic anemia     Hgb ~10   . Chronic obstructive pulmonary disease (HCC) 01/19/2017   . COPD (chronic obstructive pulmonary disease) (HCC)    . Cough    . COVID    . COVID-19 vaccine series completed    . CVA (cerebral vascular accident) (HCC) 09/01/2021   . Deficiency anemia 11/2020    rx with Iron Infusions   . Epilepsy (HCC)     per pt last Sz was approx 9-10 months ago   . Esophageal reflux    . Headache    . 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 Hemorrhoids    . History of blood transfusion      following one of her pregnancy   . History of cardiac catheterization no obstructive cad in 2017    . Hyperlipidemia    . Hyperlipidemia    . Hypertension    . Lower leg edema    . Migraines    . Paroxysmal atrial fibrillation (HCC)     on anticoagulation   . Seizure-like activity (HCC)     EEG negative   . Stable angina pectoris (HCC) 02/15/2018   . Stiff person syndrome    . Syncope     recurrent, has loop recorder   . Thoracic aortic aneurysm (HCC)    . TIA (transient ischemic attack)     multiple since age 4's per patient. Multiple MRI brain in the past negative for evidence of stroke. Suspected complicated migraine.   . Type 2 diabetes mellitus (HCC)     not on medication   . Unable to coordinate sucking, swallowing, and breathing 09/28/2019       Patient Care Team:  Trudy Mina, MD as PCP - General (Family Practice)  Read Papas, MD as Cardiologist (Cardiology)  Chan, Farn, MD as Hematologist (Hematology and Oncology)  Josepha Marinell BROCKS, MD as Gastroenterologist (Gastroenterology)  Dr. Tex as Psychiatrist (Psychiatry)  Josephus Borg, NP as Urologist (Urology)  Delores Aubry HERO, RN as RN (Integrated Care Manager)    Aubry Jama Delores, BSN, RN, CNOR  Integrated Care Manager  Ambulatory Case Management  [phone]

## 2024-01-22 ENCOUNTER — Encounter: Payer: Self-pay | Admitting: Neurology

## 2024-01-22 ENCOUNTER — Ambulatory Visit: Payer: Medicare Other | Attending: Anatomic and Clinical Pathology | Admitting: Neurology

## 2024-01-22 ENCOUNTER — Other Ambulatory Visit (FREE_STANDING_LABORATORY_FACILITY)

## 2024-01-22 VITALS — BP 131/84 | HR 75 | Temp 98.0°F | Resp 16 | Ht 65.0 in | Wt 202.0 lb

## 2024-01-22 DIAGNOSIS — E559 Vitamin D deficiency, unspecified: Secondary | ICD-10-CM

## 2024-01-22 DIAGNOSIS — G629 Polyneuropathy, unspecified: Secondary | ICD-10-CM | POA: Insufficient documentation

## 2024-01-22 DIAGNOSIS — G959 Disease of spinal cord, unspecified: Secondary | ICD-10-CM | POA: Insufficient documentation

## 2024-01-22 DIAGNOSIS — R269 Unspecified abnormalities of gait and mobility: Secondary | ICD-10-CM | POA: Insufficient documentation

## 2024-01-22 DIAGNOSIS — G9589 Other specified diseases of spinal cord: Secondary | ICD-10-CM | POA: Insufficient documentation

## 2024-01-22 DIAGNOSIS — E538 Deficiency of other specified B group vitamins: Secondary | ICD-10-CM | POA: Insufficient documentation

## 2024-01-22 LAB — COMPREHENSIVE METABOLIC PANEL
ALT: 41 U/L (ref <=55)
AST (SGOT): 38 U/L (ref <=41)
Albumin/Globulin Ratio: 0.9 (ref 0.9–2.2)
Albumin: 3.5 g/dL (ref 3.5–5.0)
Alkaline Phosphatase: 128 U/L — ABNORMAL HIGH (ref 37–117)
Anion Gap: 14 (ref 5.0–15.0)
BUN: 10 mg/dL (ref 7–21)
Bilirubin, Total: 0.3 mg/dL (ref 0.2–1.2)
CO2: 22 meq/L (ref 17–29)
Calcium: 9.5 mg/dL (ref 8.5–10.5)
Chloride: 104 meq/L (ref 99–111)
Creatinine: 1.1 mg/dL — ABNORMAL HIGH (ref 0.4–1.0)
GFR: 55.1 mL/min/{1.73_m2} — ABNORMAL LOW (ref >=60.0)
Globulin: 3.7 g/dL — ABNORMAL HIGH (ref 2.0–3.6)
Glucose: 88 mg/dL (ref 70–100)
Hemolysis Index: 33 {index}
Potassium: 4.1 meq/L (ref 3.5–5.3)
Protein, Total: 7.2 g/dL (ref 6.0–8.3)
Sodium: 140 meq/L (ref 135–145)

## 2024-01-22 LAB — LAB USE ONLY - CBC WITH DIFFERENTIAL
Absolute Basophils: 0.04 10*3/uL (ref 0.00–0.08)
Absolute Eosinophils: 0.18 10*3/uL (ref 0.00–0.44)
Absolute Immature Granulocytes: 0.01 10*3/uL (ref 0.00–0.07)
Absolute Lymphocytes: 2.08 10*3/uL (ref 0.42–3.22)
Absolute Monocytes: 0.34 10*3/uL (ref 0.21–0.85)
Absolute Neutrophils: 2.27 10*3/uL (ref 1.10–6.33)
Absolute nRBC: 0 10*3/uL (ref <=0.00)
Basophils %: 0.8 %
Eosinophils %: 3.7 %
Hematocrit: 36.3 % (ref 34.7–43.7)
Hemoglobin: 11.5 g/dL (ref 11.4–14.8)
Immature Granulocytes %: 0.2 %
Lymphocytes %: 42.3 %
MCH: 25.5 pg (ref 25.1–33.5)
MCHC: 31.7 g/dL (ref 31.5–35.8)
MCV: 80.5 fL (ref 78.0–96.0)
MPV: 10.3 fL (ref 8.9–12.5)
Monocytes %: 6.9 %
Neutrophils %: 46.1 %
Platelet Count: 148 10*3/uL (ref 142–346)
Preliminary Absolute Neutrophil Count: 2.27 10*3/uL (ref 1.10–6.33)
RBC: 4.51 10*6/uL (ref 3.90–5.10)
RDW: 15 % (ref 11–15)
WBC: 4.92 10*3/uL (ref 3.10–9.50)
nRBC %: 0 /100{WBCs} (ref <=0.0)

## 2024-01-22 LAB — VITAMIN B12: Vitamin B-12: 1550 pg/mL — ABNORMAL HIGH (ref 211–911)

## 2024-01-22 LAB — VITAMIN D, 25 OH, TOTAL: Vitamin D 25-OH, Total: 55 ng/mL (ref 30–100)

## 2024-01-22 MED ORDER — METAXALONE 800 MG PO TABS: 800.0000 mg | ORAL_TABLET | Freq: Four times a day (QID) | ORAL | 3 refills | Status: DC

## 2024-01-22 NOTE — Progress Notes (Signed)
 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 Chest pain was felt to be non-cardiogenic; she had hx diastolic HF buyt was stable. MRI showed white matter changesbut no acute stroke (gad not given). CMP & CBC were relatively unremarkable though creat 1.1 noted.     Readmitted 12/16/23 for viral gastritis manfest as diarrhea.   -------  Today  She is on MYP 1080 mg bid (6 tabs). She has some GI upset (nausea) and occasional headaches.   Physically she is doing well and continues to improve.     EXAM  Visit Vitals  BP 131/84 (BP Site: Right arm, Patient Position: Sitting, Cuff Size: Large)   Pulse 75   Temp 98 F (36.7 C) (Oral)   Resp 16   Ht 1.651 m (5' 5)   Wt 91.6 kg (202 lb)   LMP  (LMP Unknown)   SpO2 94%   BMI 33.61 kg/m     General:   Optic Nerves:   Psychiatric.   Mental Status: The patient was awake, alert, appropriate  Cranial Nerves: CN II-XII with right face weakness  Motor: 5/5 , better than before  Reflex:   Sensation: accurate BUE  Coordination: accurate BUE  Gait: narrow based gait. Pos romberg. Tandems well with unilateral support      Imaging     Reports:  MRI Brain WO Contrast  Result Date: 12/03/2023  Impression:  IMPRESSION: 1. No acute intracranial abnormality or significant interval change. 2. Periventricular and predominantly subcortical T2 hyperintensities bilaterally are moderately advanced for age. The finding is nonspecific but can be seen in the setting of chronic microvascular ischemia, a demyelinating  process such as multiple sclerosis, vasculitis, complicated migraine headaches, or as the sequelae of a prior infectious or inflammatory process. Electronically Signed   By: Lonni Necessary M.D.   On: 12/03/2023 10:29    CT Head WO Contrast  Result Date: 12/01/2023  Impression:  IMPRESSION: No acute intracranial abnormality. Electronically Signed   By: Suzen Dials M.D.   On: 12/01/2023 20:45    MRI Cervical Spine W WO Contrast  Result Date: 02/26/2023  Impression:  1. Within the confines of motion artifact, no definite focal intramedullary signal abnormality and enhancement within the cervical spinal cord. 2. Degenerative changes appear similar in extent to prior examination. Electronically signed by: Toribio Fus M.D. [facility] DF: 02/26/23    MRI Thoracic Spine W WO Contrast  Result Date: 02/26/2023  Impression:  1. Motion degraded examination. There is suspicion of T2 hyperintense signal abnormality within primarily the mid-dorsal spinal cord at the thoracic levels as described above. No definite associated enhancement. Findings are nonspecific but could represent sequela of a chronic demyelinating/inflammatory process, or possibly a metabolic abnormality including B12 deficiency. 2. Stable mild degenerative changes. Electronically signed by: Toribio Fus M.D. [facility] DF: 02/26/23       Testing       Labs  Lab Results   Component Value Date    VITB6 3.6 05/30/2023  HGBA1C 6.0 (H) 05/30/2023    EGFR 55.1 (L) 01/22/2024    CREAT 1.1 (H) 01/22/2024    WBC 4.92 01/22/2024    LYMPHOABS 2.5 08/08/2023    IGG 984 08/10/2023    IGA 477 (H) 05/30/2023    IGM 52 05/30/2023    B12 452 05/30/2023    VITD 13.6 (L) 05/30/2023       ASSESSMENT / PLAN  RTC: Return in about 3 months (around 04/22/2024) for [place].    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 Myelopathy (CMS/HCC)    3. Vitamin B12 deficiency    4. Vitamin D  deficiency    5. Sensory  neuropathy    6. Neurologic gait dysfunction        68 y.o. 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 A negative CSF GAD65 definitively excludes Stiff Person Syndrome. She continues to improve on MYP     She is on 6 tabs MYP daily; cannot tolerate a higher dose due to GI upset.     PLAN  - continue MYP 3 tabs bid. Advised to take w/ food to improve tolerability  - vit b12 1000mcg daily  - vit d3 50k/week  - metaxolone 800mg  qid for pain  - gait & balance PT pending  - avoid diazepam , opiates, reduce tramadol .   - w/ Dr Reggie Cherrick: would consider pregabalin  for pain    Orders today (details below)  Orders Placed This Encounter   Procedures    CBC with Differential (Order)    Comprehensive Metabolic Panel    Vitamin D , 25 OH, Total    Vitamin B12     Orders Placed This Encounter   Medications    metaxalone  (SKELAXIN ) 800 MG tablet     Sig: Take 1 tablet (800 mg) by mouth 4 (four) times daily     Dispense:  360 tablet     Refill:  3       I spent 40 minutes reviewing the records, talking with the patient and developing their care plan. This excludes time for separately billed procedures.      Sammi Lockwood, MD PHD  Director, Neuroimmunology & MS Center  [facility]  [address], [address], [place], [place] [address]    Telephone [phone]  Fax [phone]   The fastest response is via [place]  ===================================================================  NFL (neurofilament light chain) Insurance Justification  Serum / plasma Neurofilament light chain is a well-established biomarker for monitoring MS, and regular testing has been incorporated into the Consortium of MS Centers [facility]) guidelines for the monitoring of MS. https://mscare.http://www.stevens.com/     PATIENT  INSTRUCTIONS PROVIDED  Patient was given an After Visit Summary with a copy of the testing orders, medications and the following other instructions:  There are no Patient Instructions on file for this visit.    IMPORTED INFORMATION FROM MEDICAL RECORDS   Diagnosis ICD-10-CM Associated Order   1. Autoimmune disorder of spinal cord (CMS/HCC)  G95.89 CBC with Differential (Order)     Comprehensive Metabolic Panel     Vitamin D , 25 OH, Total     Vitamin B12      2. Myelopathy (CMS/HCC)  G95.9       3. Vitamin B12 deficiency  E53.8 Vitamin B12      4. Vitamin D  deficiency  E55.9 Vitamin D , 25 OH, Total      5. Sensory neuropathy  G62.9       6. Neurologic gait dysfunction  R26.9         Orders Placed This Encounter   Procedures    CBC with Differential (Order)     Standing Status:   Future     Number of Occurrences:   1     Expected Date:   01/22/2024     Expiration Date:   01/21/2025     Release to patient:   Immediate    Comprehensive Metabolic Panel     Standing Status:   Future     Number of Occurrences:   1     Expected Date:   01/22/2024     Expiration Date:   01/21/2025     Has the patient fasted?:   No     Release to patient:   Immediate    Vitamin D , 25 OH, Total     Standing Status:   Future     Number of Occurrences:   1     Expected Date:   01/22/2024     Expiration Date:   01/21/2025     Release to patient:   Immediate    Vitamin B12     Standing Status:   Future     Number of Occurrences:   1     Expected Date:   01/22/2024     Expiration Date:   01/21/2025     Release to patient:   Immediate

## 2024-01-25 ENCOUNTER — Telehealth: Payer: Self-pay | Admitting: Neurology

## 2024-01-25 NOTE — Telephone Encounter (Signed)
 Copied from CRM [id number]. Topic: Clinical Support - Speak With Nurse  >> Jan 25, 2024 12:35 PM Emerick HERO wrote:  Dost, [name] called about Clinical Support - Speak With Nurse.  Additional details:    Patient called b/c the metaxalone  (SKELAXIN ) 800 MG tablet is not covered by insurance and they're trying to charge her over $1000. Please call patient back at: [phone]

## 2024-02-06 ENCOUNTER — Telehealth: Payer: Self-pay | Admitting: Neurology

## 2024-02-06 NOTE — Telephone Encounter (Signed)
 Copied from CRM [id number]. Topic: Clinical Support - Medical Question  >> Feb 06, 2024 12:04 PM Loetta Ringer D wrote:  Patient is calling because she states she cannot remember the vitamin supplements Dr. Chalice Colt asked her to take, patient states she knows about the B12 and Magnesium  but there was one more supplement that she is not sure about. Please reach out to patient on mychart and verify

## 2024-02-06 NOTE — Telephone Encounter (Signed)
 Response sent Via MyChart

## 2024-03-05 NOTE — Progress Notes (Signed)
 Subjective  EARLY ORD is a 68 y.o. female who presents for Initial Palliative.    Pt with CHF seen today in her home. She reports that Neuro has ruled out stiff person syndrome and has been diagnosed with Autoimmune Myelopathy. She is tolerating immunosuppressive therapy without issue. Pt reports she is at baseline today and reports she is doing well at the time of visit, denies dyspnea, weight gain,worsening edema, CP. She continues to be followed by pain management and denies pain at the time of visit.. She continues outpatient PT to address pain, balance, flexibility and strength. She denies recent falls, medication changes, or admissions.      Review of Systems   Constitutional:  Negative for activity change, appetite change, fatigue, fever and unexpected weight change.   Respiratory:  Negative for cough and shortness of breath.    Cardiovascular:  Positive for leg swelling (chronic). Negative for chest pain.   Gastrointestinal:  Negative for constipation, diarrhea, nausea and vomiting.   Genitourinary:  Negative for difficulty urinating.   Musculoskeletal:  Positive for gait problem (cane PRN) and joint swelling.   Skin:  Negative for rash and wound.   Neurological:  Positive for weakness. Negative for dizziness and syncope.   Psychiatric/Behavioral:  Negative for dysphoric mood and sleep disturbance. The patient is not nervous/anxious.      [place] Symptom Assessment System  Pain Score (If > or = 3, complete the Pain Assessment): No pain  Tiredness Score: Not tired  Nausea Score: Not nauseated  Depression Score: Not depressed  Anxiety Score: Not anxious  Drowsiness Score: Not drowsy  Appetite Score: Best appetite  Wellbeing Score: Best feeling of wellbeing  Dyspnea Score: No shortness of breath    Patient Active Problem List    Diagnosis Date Noted   . Atrial fibrillation 09/12/2023   . Pain, chronic 09/12/2023   . HTN (hypertension) 09/12/2023   . Epilepsy 09/12/2023   . Debility 09/12/2023   . At risk  for falling 09/12/2023   . Dysphagia 09/12/2023   . COPD (chronic obstructive pulmonary disease) 09/12/2023   . Congestive heart failure (CHF) 09/12/2023     Past Surgical History:   Procedure Laterality Date   . CT ANGIOGRAM CHEST  09/27/2018    CT ANGIOGRAM CHEST 09/27/2018   . CT ANGIOGRAM CHEST  02/05/2017    CT ANGIOGRAM CHEST 02/05/2017   . CT ANGIOGRAM CHEST  01/16/2017    CT ANGIOGRAM CHEST 01/16/2017   . CT ANGIOGRAM CHEST  12/23/2013    CT ANGIOGRAM CHEST 12/23/2013   . MR ANGIOGRAM HEAD WO IV CONTRAST  06/16/2019    MR ANGIOGRAM HEAD WO IV CONTRAST 06/16/2019   . MR ANGIOGRAM HEAD WO IV CONTRAST  12/01/2015    MR ANGIOGRAM HEAD WO IV CONTRAST 12/01/2015   . MR ANGIOGRAM HEAD WO IV CONTRAST  05/28/2014    MR ANGIOGRAM HEAD WO IV CONTRAST 05/28/2014   . MR ANGIOGRAM NECK W AND WO IV CONTRAST  06/16/2019    MR ANGIOGRAM NECK W AND WO IV CONTRAST 06/16/2019     Current Outpatient Medications   Medication Instructions   . apixaban  (Eliquis ) 5 MG tablet Oral   . dapagliflozin (FARXIGA) 10 mg, Oral, Daily RT   . diazePAM  (Valium ) 5 MG tablet Oral   . diclofenac sodium 1 % gel Topical   . gabapentin  (Neurontin ) 100 MG capsule Oral   . hyoscyamine (Anaspaz,Levsin) 0.125 MG tablet Oral   . lamoTRIgine  (LAMICTAL ) 100 mg, Oral,  Daily   . lurasidone  (LATUDA ) 120 mg, Oral, Every evening   . metoprolol  succinate XL (TOPROL -XL) 25 mg, Oral, Daily RT   . Mycophenolic Acid  360 mg, Oral, Daily   . pantoprazole  (PROTONIX ) 40 mg, Oral   . potassium chloride  CR (Klor-Con ) 10 MEQ ER tablet 10 mEq, Oral   . promethazine  (Phenergan ) 25 MG tablet Oral   . QUEtiapine  (SEROQUEL ) 100 mg, Oral, Nightly   . rOPINIRole  (Requip ) 0.5 MG tablet Oral   . torsemide (Demadex) 20 MG tablet Oral   . traMADol  (ULTRAM ) 50 mg, Oral     Social Drivers of Health with Concerns     Physical Activity: Insufficiently Active (07/28/2023)    [facility]    Exercise Vital Sign    . Days of Exercise per Week: 2 days    . Minutes of Exercise per Session: 10 min    Stress: Stress Concern Present (07/28/2023)    Received from [facility] - Occupational Stress Questionnaire    . Feeling of Stress : To some extent             Objective  Blood pressure 131/74, pulse 84, temperature 36.6 C (97.9 F), temperature source Temporal, height 1.651 m (5' 5), weight 199 lb (90.3 kg), SpO2 99%.  Oxygen Therapy: None (Room air)  Physical Exam  Constitutional:       General: She is not in acute distress.     Appearance: Normal appearance. She is normal weight. She is not ill-appearing or diaphoretic.   HENT:      Head: Normocephalic and atraumatic.      Mouth/Throat:      Mouth: Mucous membranes are moist.   Eyes:      Conjunctiva/sclera: Conjunctivae normal.   Cardiovascular:      Rate and Rhythm: Normal rate and regular rhythm.      Pulses: Normal pulses.      Heart sounds: Normal heart sounds.   Pulmonary:      Effort: Pulmonary effort is normal. No respiratory distress.      Breath sounds: Normal breath sounds.   Abdominal:      General: Abdomen is flat. Bowel sounds are normal.      Palpations: Abdomen is soft.   Musculoskeletal:      Cervical back: Neck supple.      Right lower leg: Edema present.      Left lower leg: Edema present.   Skin:     General: Skin is warm and dry.   Neurological:      General: No focal deficit present.      Mental Status: She is alert and oriented to person, place, and time. Mental status is at baseline.   Psychiatric:         Mood and Affect: Mood normal.         Behavior: Behavior normal.         Thought Content: Thought content normal.         Judgment: Judgment normal.         PPS Score: 70%  Morse Fall Risk Score: 30  ADL Screening  Dressing: Independent  Feeding: Independent  Bathing: Independent  Toileting: Independent  In/Out Bed: Independent  Walks in Home: Independent    Advance Care Planning  Advance Directive on file?: <no information>  Advance Directive Reviewed? Yes  ACP Discussion  What are your  most important goals if your health situation worsens?:  being comfortable, living as long as possible, being independent  Comments: Daughter is health care advocate. Patient prefers all life prolonging interventions, reduced symptoms burden, to maintain as much independence as possible.    GOC conversation initiated, POST form reviewed. Patient would like to discuss with daughter.  06/28/2023 Confirmed existing GOC and discussed ACP completion  10/3- Patient wants all medical interventions if needed. Her daughter is aware.             Assessment/Plan  1. Chronic congestive heart failure, unspecified heart failure type (Primary)  Overview:  Cardiology - Dr. Read; NYHA Class II  Assessment & Plan:    Condition: stable  Weight Gain: No  Dyspnea: No  Edema:+1  Medication Adherences: Yes    Plan of Care:  Continue current medications and routine cardiology follow up, Advised on low sodium diet and regular weight monitoring, and [facility] 24/7 hr phone number and to call with questions and concerns.    Education:  It is important to proactively avoid and address congestive heart failure exacerbation. If you are experiencing any of the following please call [facility], Palliative Care: Weight increases 2lbs overnight or 5 lbs from baseline; increasing shortness of breath; and increased swelling.      2. 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   Neurology Dr. Gerarda Kays; Notes:  Assessment & Plan:  Improved: Pt previously requiring a walker for ambulation, now uses cane only PRN when leaving the home. In PT  Weakness (decreased grip strength): No  Slow Walking Speed (>6 to 7 seconds to walk 15 feet): No  Decreased Physical Activity: No  Weight Loss (> or = 5% in the last year): No    Continue PT and follow up with Neurology as directed  Educated on falls precautions  Encouraged to call [facility] 24/7 phone number for new or worsening symptoms.          Other orders  -     Follow Up In Palliative Medicine  -     Follow Up In Palliative Medicine; Future          Palliative Care Follow-up Expected on   Palliative Care Follow-up Expected on 04/06/2024     Moon, Kristin, NP

## 2024-03-25 ENCOUNTER — Telehealth: Payer: Self-pay | Admitting: Neurology

## 2024-03-25 NOTE — Telephone Encounter (Signed)
 Copied from CRM #[id number]. Topic: Clinical Support - Speak With Nurse  >> Mar 25, 2024 12:55 PM [name] wrote:  [name], [name] called about Clinical Support - Speak With Nurse.  Additional details:      Patient is calling to let Dr. Deatrice know that she is having issues with the medication metaxalone  (SKELAXIN ) 800 MG tablet. She's having increased sweating and stiffness in her legs. She is asking for a call back to discuss.     Phone- [phone]

## 2024-03-27 NOTE — ED Provider Notes (Signed)
 [facility] [place]  [facility]  [facility] [place]  [facility] EMERGENCY DEPT  [address]  [place] [address]  Dept: [phone]  Loc Appt: [phone]  Loc: [phone]         [name] is a 68 y.o. female with history of paroxysmal Afib (on eliquis ), HTN, HLD, DM, TIA/CVA, COPD, seizure disorder, stiff person syndrome, migraines, asthma, anemia and bipolar disorder BIBA who presents with multiple seizure episodes that began just prior to arrival today.       Diagnosis   (G40.909) Seizure disorder [facility])      Disposition   Discharge      HPI   [name] is a 68 y.o. female with hx of paroxysmal Afib (on eliquis ), HTN, HLD, DM, TIA/CVA, COPD, seizure disorder, stiff person syndrome, migraines, asthma, anemia and bipolar disorder BIBA who presents with multiple seizure episodes that began just prior to arrival today.  According to reports, family witnessed the patient collapse while she was sitting outside in her gazebo with generalized tonic-clonic seizure activity.  Upon EMS arrival, pt was administered 2.5 mg intranasal Versed and arrives to the ED at her baseline mentation.  Pt reports her baseline seizures are not grand mal, but absence seizures.  She has not had a seizure in a while so she stopped taking her lamotrigine .  No other aggravating or relieving factors reported.  Associated symptoms consist of headache and chest pressure.  Pertinent negatives include no HA, neck pain, fever/chills, cough, SOB, N/V/D or abdominal pain.    PMHx: A-fib, HTN, HLD, DM, TIA/CVA, COPD, seizure disorder, stiff person syndrome, migraines, asthma, anemia, bipolar disorder  Soc Hx: Denies  Fam Hx: NC  Allergies: Multiple allergies (see list in record)    PMD/Specialists   PMD:  [name], MD       ROS     Constitutional: No fevers/chills, No fatigue   Head: NC/AT  Eyes: No photophobia or diplopia.   ENT: No neck pain or neck  stiffness  Resp: No SOB or cough.    CV: (+) Chest Pain, No Palpitations.   GI: No Nausea, Vomiting, Diarrhea. No abd pain   MSK: No muscle or joint pains   Skin: Negative for rash.   Neuro: Negative for dizziness, (+) headache.      -Nursing notes reviewed by me.   -Past Medical/Surgical/Family/Social History reviewed by me (as documented by RN notes)       Physical Exam     BP 126/61   Pulse 66   Temp 97.6 F (36.4 C)   Resp 12   Ht 5' 5 (1.651 m)   Wt 90.7 kg (200 lb)   LMP 10/13/1979   SpO2 93%   BMI 33.28 kg/m      Pulse Oximetry Analysis - Normal  Cardiac Monitor Interpretation - NSR    Vital Signs Reviewed  GEN:  Appears comfortable.       Head: Normocephalic, Atraumatic.      Eyes: nl conjunctiva. NO discharge.       ENT: Posterior oropharynx wnl. Moist mucus membranes.       Neck: Full range of motion. No JVD.       Chest:  CTAB, NO respiratory distress.        CV: RRR, NO murmur.        Abd: soft, non-tender, NO distention.             UpperExt: NO deformity. 2+ radial pulses bilaterally.  LowerExt: NO edema. 2+ pp, NO calf tenderness.        Neuro: moving all extremities, aaox3.         Skin: Warm and dry. NO rash.        Psych: Normal affect. Normal concentration.            MDM / DDX / ED Course       DDX:  Seizure DDX- seizures, pseudoseizures, withdrawal seizures, hyponatremia, hypoglycemia, MI, ACS: Stable vs unstable angina, Atrial flutter, A-fib, Pleurisy, CHF, Chest wall pain, Reflux, Pneumonia.    Plan:  CT head, EKG, cXR, Labs, LFTs, troponin, alcohol level, drug screen, UA, Morphine  IV, 100 mg Lamotrigine  PO, cardiac monitor w/ pulse oximetry, outpt f/u to neurology.      ED Course:           Labs reviewed by me, which appear unremarkable.        CT head shows  Impression   IMPRESSION:   Unremarkable study.   Electronically signed by: MARLA Franky Oaks, MD  Board Certified   Radiologist 03/27/2024 7:09 PM EDT         Case discussed with Dr. Molli, neurologist on-call, who agrees  with assessment and plan to discharge the patient and she remains at her baseline mentation.  Since she has had no further incident in the ED, she is appropriate for outpatient follow-up with her neurologist for further management.        Based upon the history of the complaint, and the available objective information, the patient does not appear to be suffering from a condition that neccesitates emergency medical intervention or admittance to the hospital for further monitoring.          Patient feeling better after treatment.   No significant concern on labs or imaging to warrant further testing.   At discharge, pt looked well, nontoxic, no distress, and is good candidate for outpatient follow up. No signs of toxicity to suggest need for further labs, imaging or for admission.   Results discussed w/ patient/family.  All questions were answered.           EKG    EKG Interpretation, as per my read:  Normal sinus rhythm, rate 80; Normal axis and intervals; No acute ST changes, diffuse nonspecific T wave changes...    X-RAY (Interpreted by Dr. Aldean)   cXR shows no acute disease      Critical Care Time           Laboratory results reviewed by EDP:  Yes  Radiology results reviewed by EDP:  Yes  Radiologic Studies Interpreted by EDP:  Yes          Lauree JINNY Aldean, DO  03/27/2024, 6:27 PM        Aldean Lauree JINNY, DO  03/29/2024, 1:38 AM     ___________________________    Medications  (ED/RX)     Meds administered in ER this visit:  Medications   sodium chloride  (normal saline) 0.9% infusion (0 mL Intravenous stopped 03/27/24 2129)   morphine  injection 4 mg (4 mg Intravenous Given 03/27/24 1929)   cefTRIAXone (Rocephin) 1 g in SWFI 10 mL syringe (1 g Intravenous Given 03/27/24 2242)   lamoTRIgine  ER (LaMICtal  XR) tablet 100 mg (100 mg Oral Given 03/27/24 2255)       New prescriptions given to patient:  Discharge Medication List as of 03/27/2024 10:51 PM             Labs  Results for orders placed or performed during the hospital  encounter of 03/27/24   Urine Culture & Sensitvity    Specimen: Clean Catch Urine   Result Value Ref Range    Culture Greater than 100,000 ORG/ML Klebsiella pneumoniae (A)    CG4+ i-STAT (Lab)   Result Value Ref Range    SAMPLE TYPE VENOUS     LACT V POC 2.25 (H) 0.90 - 2.00 mmol/L    Cartridge type CG4+     PATIENT TEMPERATURE 97.6 F    EKG 12 LEAD UNIT PERFORMED   Result Value Ref Range    Heart Rate 80 bpm    RR Interval 748 ms    Atrial Rate 80 ms    P-R Interval 162 ms    P Duration 122 ms    P Horizontal Axis -11 deg    P Front Axis 39 deg    Q Onset 499 ms    QRSD Interval 89 ms    QT Interval 359 ms    QTcB 415 ms    QTcF 395 ms    QRS Horizontal Axis -8 deg    QRS Axis 32 deg    I-40 Front Axis 25 deg    t-40 Horizontal Axis -13 deg    T-40 Front Axis 40 deg    T Horizontal Axis  deg    T Wave Axis 254 deg    S-T Horizontal Axis 173 deg    S-T Front Axis 230 deg    Impression - BORDERLINE ECG -     Impression SR-Sinus rhythm-normal P axis, V-rate 50-99     Impression       ET-Abnormal R-wave progression, early transition-QRS area>0 in V2    Impression       REPB-Borderline  repolarization abnormality-ST dep & abnormal T    Impression       -No significant change since prior tracing, 02/11/2024-   COMPREHENSIVE METABOLIC PANEL   Result Value Ref Range    Potassium 3.3 (L) 3.5 - 5.5 mmol/L    Sodium 139 133 - 145 mmol/L    Chloride 96 (L) 98 - 110 mmol/L    Glucose 99 70 - 99 mg/dL    Calcium  9.8 8.4 - 10.5 mg/dL    Albumin  4.3 3.5 - 5.0 g/dL    SGPT (ALT) 22 5 - 40 U/L    SGOT (AST) 24 10 - 37 U/L    Bilirubin Total 0.2 0.2 - 1.2 mg/dL    Alkaline Phosphatase 118 40 - 120 U/L    BUN 11 6 - 22 mg/dL    CO2 32 20 - 32 mmol/L    Creatinine 0.9 0.8 - 1.4 mg/dL    eGFR 33.6 >39.9 fO/fpw/8.26 sq.m.    Globulin 2.6 2.0 - 4.0 g/dL    A/G Ratio 1.7 1.1 - 2.6 ratio    Total Protein 6.9 6.2 - 8.1 g/dL    Anion Gap 88.9 3.0 - 15.0 mmol/L   TROPONIN   Result Value Ref Range    Troponin (T) Quant High Sensitivity (5th  Gen) 8 0 - 19 ng/L   ALCOHOL ETHYL   Result Value Ref Range    Ethanol Serum <=0.01 <=0.02 g/dL   ACETAMINOPHEN  LEVEL   Result Value Ref Range    Acetaminophen  <5 (L) 10 - 30 mcg/mL   SALICYLATE LEVEL   Result Value Ref Range    Salicylate <1 (L) 15 - 30 mg/dL   CBC WITH DIFFERENTIAL AUTO  Result Value Ref Range    WBC 7.6 4.0 - 11.0 K/uL    RBC 4.24 3.80 - 5.20 M/uL    HGB 10.9 (L) 11.7 - 16.1 g/dL    HCT 65.3 (L) 64.8 - 48.3 %    MCV 82 80 - 99 fL    MCH 26 26 - 34 pg    MCHC 32 31 - 36 g/dL    RDW 85.0 89.9 - 84.4 %    Platelet 336 140 - 440 K/uL    MPV 9.6 9.0 - 13.0 fL    Segmented Neutrophils (Auto) 51 40 - 75 %    Lymphocytes (Auto) 40 20 - 45 %    Monocytes (Auto) 7 3 - 12 %    Eosinophils (Auto) 2 0 - 6 %    Basophils (Auto) 1 0 - 2 %    Absolute Neutrophils (Auto) 3.9 1.8 - 7.7 K/uL    Absolute Lymphocytes (Auto) 3.0 1.0 - 4.8 K/uL    Absolute Monocytes (Auto) 0.6 0.1 - 1.0 K/uL    Absolute Eosinophils (Auto) 0.1 0.0 - 0.5 K/uL    Absolute Basophils (Auto) 0.0 0.0 - 0.2 K/uL   Urinalysis w Micro Reflex Culture    Specimen: Clean Catch Urine   Result Value Ref Range    Source Urine      Urine Color Yellow Colorless, Pale Yellow, Light Yellow, Yellow, Dark Yellow, Straw    Urine Clarity Cloudy (A) Clear, Slightly Cloudy    Urine pH 5.0 5.0 - 8.0 pH    Urine Protein Screen Negative Negative, Trace mg/dL    Urine Glucose 499* (A) Negative mg/dL    Urine Ketones Trace (A) Negative mg/dL    Urine Occult Blood Moderate (A) Negative    Urine Specific Gravity 1.018 1.005 - 1.030    Urine Nitrite Positive (A) Negative    Urine Leukocyte Esterase Moderate (A) Negative    Urine Bilirubin Negative Negative    Urine Urobilinogen 0.2 <2.0 mg/dL mg/dL    Urine RBC 0-2 Negative, 0-2 /hpf    Urine WBC 51-100 (A) 0 - 5 /hpf    Urine Bacteria Present (A) Negative    Urine Yeast Present (A) (none)    Squamous Epithelial Cells 0-2 None, 0-2 /hpf    Hyaline Cast 0-2 0 - 2 /lpf   Urine Drug Screen 9   Result Value Ref Range     Amphetamine Screen NDET NDET    Opiate Screen DET (A) NDET    Cocaine Screen NDET NDET    Cannabinoids Screen NDET NDET    Barbiturates Screen Urine NDET NDET    Benzodiazepine Screen DET (A) NDET    Hydrocodone Screen Urine NDET NDET    Oxycodone  Screen NDET NDET    6-Acetylmorphine Screen NDET NDET    pH Urine Drug 5.0 4.5 - 8.0    SPECIFIC GRAVITY URINE 1.018 1.003 - 1.030    Creatinine Urine mg/dL 863 mg/dL   EDIE VALUES   Result Value Ref Range    EDIE SECURITY      EDIE PDMP 1     EDIE CAREPLAN      EDIE FREQUENCY 1     EDIE FACILITY       *Note: Due to a large number of results and/or encounters for the requested time period, some results have not been displayed. A complete set of results can be found in Results Review.  Radiology     ED CT HEAD NO CONTRAST   Final Result   IMPRESSION:   Unremarkable study.   Electronically signed by: MARLA Franky Oaks, MD  Board Certified    Radiologist 03/27/2024 7:09 PM EDT      CHEST PORTABLE - Chest Pain   Final Result   IMPRESSION:   No acute cardiopulmonary disease.   Electronically signed by: MARLA Franky Oaks, MD  Board Certified    Radiologist 03/27/2024 6:53 PM EDT      EKG 12 LEAD UNIT PERFORMED   Final Result                 Vital Signs this ED Visit     No data found.             Patient History     Past Medical History:  Past Medical History:   Diagnosis Date   . Acute, but ill-defined, cerebrovascular disease     TIA   . Arthropathy     knees   . Asthma    . Bipolar disorder (HCC)    . Cardiac arrhythmia     h/o Afib on Eliquis    . Cerebral artery occlusion with cerebral infarction [facility])     CVA post endodartorectomy   . Chest pain     has history multiple admissions for this, stress test and cardiac cath negative   . Chronic anemia     Hgb ~10   . Chronic obstructive pulmonary disease (HCC) 01/19/2017   . COPD (chronic obstructive pulmonary disease) (HCC)    . Cough    . COVID    . COVID-19 vaccine series completed    . CVA (cerebral vascular accident)  (HCC) 09/01/2021   . Deficiency anemia 11/2020    rx with Iron Infusions   . Epilepsy (HCC)     per pt last Sz was approx 9-10 months ago   . Esophageal reflux    . Headache    . 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 Hemorrhoids    . History of blood transfusion     following one of her pregnancy   . History of cardiac catheterization no obstructive cad in 2017    . Hyperlipidemia    . Hyperlipidemia    . Hypertension    . Lower leg edema    . Migraines    . Paroxysmal atrial fibrillation (HCC)     on anticoagulation   . Seizure-like activity (HCC)     EEG negative   . Stable angina pectoris 02/15/2018   . Stiff person syndrome    . Syncope     recurrent, has loop recorder   . Thoracic aortic aneurysm    . TIA (transient ischemic attack)     multiple since age 79's per patient. Multiple MRI brain in the past negative for evidence of stroke. Suspected complicated migraine.   . Type 2 diabetes mellitus (HCC)     not on medication   . Unable to coordinate sucking, swallowing, and breathing 09/28/2019       Past Surgical History:   Past Surgical History:   Procedure Laterality Date   . BREAST BIOPSY Right 06/18/2021    right ultrasound core biopsy with clip placed   . CARDIAC CATH  2016, 02/2016   . CAROTID ENDARTERECTOMY     . COLONOSCOPY N/A 07/01/2013    Procedure: COLONOSCOPY;  Surgeon: Neysa Katz, MD;  Location:  [facility] ENDO OR LOC;  Service: Endoscopy GI;  Laterality: N/A;   . COLONOSCOPY N/A 04/13/2015    Procedure: COLONOSCOPY FLEXIBLE;  Surgeon: Glendia Sherry, MD;  Location: [facility] ENDO OR LOC;  Service: Endoscopy GI;  Laterality: N/A;   . COLONOSCOPY WITH BIOPSY N/A 04/13/2015    Procedure: COLONOSCOPY FLEXIBLE WITH BIOPSY;  Surgeon: Glendia Sherry, MD;  Location: [facility] ENDO OR LOC;  Service: Endoscopy GI;  Laterality: N/A;   . COLONOSCOPY WITH BIOPSY N/A 01/31/2017    Procedure: COLONOSCOPY FLEXIBLE WITH BIOPSY;  Surgeon:  Vicci Emery DEL, MD   . COLONOSCOPY WITH BIOPSY N/A 05/29/2017    Procedure: COLONOSCOPY, WITH BIOPSY;  Surgeon: Josepha Marinell BROCKS, MD   . COLONOSCOPY WITH BIOPSY N/A 05/29/2017    Procedure: COLONOSCOPY, WITH BIOPSY;  Surgeon: Josepha Marinell BROCKS, MD   . EGD N/A 07/01/2013    Procedure: ESOPHAGOGASTRODUODENOSCOPY;  Surgeon: Neysa Katz, MD;  Location[facility] ENDO OR LOC;  Service: Endoscopy GI;  Laterality: N/A;   . EGD N/A 08/08/2014    Procedure: ESOPHAGOGASTRODUODENOSCOPY FLEXIBLE TRANSORAL DIAGNOSTIC;  Surgeon: Glendia Copier, MD;  Location: [facility] ENDO OR LOC;  Service: Endoscopy GI;  Laterality: N/A;   . EGD WITH BIOPSY N/A 06/07/2016    Procedure: ESOPHAGOGASTRODUODENOSCOPY FLEXIBLE TRANSORAL WITH BIOPSY;  Surgeon: Vicci Emery DEL, MD   . EGD WITH BIOPSY N/A 03/20/2020    Procedure: EGD, WITH BIOPSY;  Surgeon: Josepha Marinell BROCKS, MD   . EGD WITH BLEEDING CONTROL N/A 05/29/2017    Procedure: EGD, WITH HEMORRHAGE CONTROL;  Surgeon: Josepha Marinell BROCKS, MD   . EXCISION OF BENIGN LESION Right 10/19/2021    Procedure: EXCISION, LESION, BENIGN, BREAST;  Surgeon: Clance Rosealee HERO, MD   . HEART CATHETERIZATION     . HYSTERECTOMY     . LOOP ELECTROSURGICAL EXCISON PROCEDURE     . LOOP RECORDER  2018   . OTHER Right 06/18/2021    axillary lymph node biopsy with clip placed   . OTHER  05/2021    Left leg ablation procedure   . VENOUS ACCESS CATHETER INSERTION Right 03/29/2019       Family History:   Family History   Problem Relation Age of Onset   . Hypertension Mother    . Diabetes Mother    . Hypertension Sister    . Diabetes Brother    . Hypertension Brother    . Other Family History Father         Unknown to patient   . Anesthesia Reaction Neg Hx        Social History:   Social History     Socioeconomic History   . Marital status: Divorced     Spouse name: Not on file   . Number of children: Not on file   . Years of education: Not on file   . Highest education level: Not on file   Occupational History   . Not on file   Tobacco  Use   . Smoking status: Never     Passive exposure: Never   . Smokeless tobacco: Never   Vaping Use   . Vaping status: Never Used   Substance and Sexual Activity   . Alcohol use: No     Alcohol/week: 0.0 standard drinks of alcohol   . Drug use: No   . Sexual activity: Not Currently   Other Topics Concern   . Back Care Not Asked   . Bike Helmet Not Asked   . Blood Transfusions Not Asked   .  Caffeine  Concern Not Asked   . Counseling Not Asked   . Depression Concerns Not Asked   . Depression Screening Not Asked   . Exercise Not Asked   . Hobby Hazards Not Asked   . Military Service Not Asked   . Occupational Exposure Not Asked   . Seat Belt Not Asked   . Self-Exams Not Asked   . Sleep Concern Not Asked   . Smoke Detectors Not Asked   . Smoking Concerns Not Asked   . Smoking Cessation Not Asked   . Special Diet Not Asked   . Stress Concern Not Asked   . Weight Concern Not Asked   . Do you use a bike helmet? Not Asked   . Do you use caffeine  daily? Not Asked   . Do you exercise? Not Asked   . Are there hazards related to your hobbies? Not Asked   . Military Service Not Asked   . Do you have any work place hazards? Not Asked   . Do you wear a seat belt while in a moving vehicle? Not Asked   . Do you have sleep concerns? Not Asked   . Do you follow a special diet? Not Asked   . Are you concerned with your weight? Not Asked   Social History Narrative    DNR/DNI    MPOA - her daughter Warrick Silvius     Social Drivers of Health     Financial Resource Strain: Low Risk  (07/28/2023)    Overall Financial Resource Strain (CARDIA)    . Difficulty of Paying Living Expenses: Not hard at all   Food Insecurity: No Food Insecurity (07/28/2023)    Hunger Vital Sign    . Worried About Programme researcher, broadcasting/film/video in the Last Year: Never true    . Ran Out of Food in the Last Year: Never true   Transportation Needs: No Transportation Needs (07/28/2023)    PRAPARE - Transportation    . Lack of Transportation (Medical): No    . Lack of Transportation  (Non-Medical): No   Physical Activity: Insufficiently Active (07/28/2023)    Exercise Vital Sign    . Days of Exercise per Week: 2 days    . Minutes of Exercise per Session: 10 min   Stress: Stress Concern Present (07/28/2023)    [employer] of Occupational Health - Occupational Stress Questionnaire    . Feeling of Stress : To some extent   Social Connections: Moderately Integrated (07/28/2023)    Social Connection and Isolation Panel [NHANES]    . Frequency of Communication with Friends and Family: More than three times a week    . Frequency of Social Gatherings with Friends and Family: Once a week    . Attends Religious Services: More than 4 times per year    . Active Member of Clubs or Organizations: Yes    . Attends Banker Meetings: More than 4 times per year    . Marital Status: Divorced   Catering manager Violence: Not At Risk (07/28/2023)    Humiliation, Afraid, Rape, and Kick questionnaire    . Fear of Current or Ex-Partner: No    . Emotionally Abused: No    . Physically Abused: No    . Sexually Abused: No   Housing Stability: Low Risk  (07/28/2023)    Housing Stability Vital Sign    . Unable to Pay for Housing in the Last Year: No    . Number of Times  Moved in the Last Year: 0    . Homeless in the Last Year: No       Home Medications:   Home Medication List - Marked as Reviewed on 03/28/24 1141   Medication Sig   Ammonium Lactate 12 % Top CREA PLEASE SEE ATTACHED FOR DETAILED DIRECTIONS   apixaban  (ELIQUIS ) 5 mg PO TABS Take 1 Tab by Mouth Twice Daily.   dapagliflozin propanediol (FARXIGA) 10 mg PO TABS Take 1 Tab by Mouth Once a Day.   EPINEPHrine (EPIPEN) 0.3 mg/0.3 mL Inj AtIn Inject 0.3 mL into the muscle Take As Needed for Other (ANAPHYLAXIS). Q 10-30 minutes prn for ANAPHYLAXIS ONLY   lamoTRIgine  (LAMICTAL ) 100 mg PO TABS Take 1 Tab by Mouth Once a Day. Indications: epilepsy   Loperamide 2 mg PO TABS Take 1 Tab by Mouth Every 4 Hours As Needed (loose stools). Not to exceed 8 tablets in 24  hours.  Patient not taking: Reported on 03/28/2024   lurasidone  (LATUDA ) 40 mg PO TABS TAKE 1 TABLET DAILY WITH FOOD (AT LEAST 350 CALORIES)   MAGNESIUM  GLYCINATE PO Take 100 mg by Mouth Once a Day. Indications: patient states she takes this over the counter   metaxalone  (SKELAXIN ) 800 mg PO TABS Take 1 Tab by Mouth Once a Day.   metoprolol  XL (TOPROL  XL) 25 mg PO TB24 Take 1 Tab by Mouth Once a Day.   Mycophenolate  Sodium 360 mg PO TBEC Take 4 Tabs by Mouth Twice Daily.   ondansetron  (ZOFRAN ) 4 mg PO ODT. Take 1 Tab by Mouth Every 8 Hours As Needed (PRN FOR NAUSEA AND VOMITING). ALLOW TABLET TO DISSOLVE ON TONGUE.   pantoprazole  (PROTONIX ) 40 mg PO TBEC TAKE 1 TABLET BY MOUTH TWICE A DAY   promethazine  (PHENERGAN ) 25 mg PO TABS Take 1 Tab by Mouth Take As Needed.   QUEtiapine  SR (SEROQUEL  XR) 50 mg PO TB24 Take 1 Tab by Mouth Every 24 Hours. Indications: manic-depression   rOPINIRole  (REQUIP ) 0.5 mg PO TABS TAKE 2 TABS BY MOUTH EVERY NIGHT AT BEDTIME.   torsemide (DEMADEX) 20 mg PO TABS Take 1 Tab by Mouth Once a Day.   traMADoL  (ULTRAM ) 50 mg PO TABS Take 1 Tab by Mouth 2 Times Daily As Needed. for pain  Patient taking differently: Take 1 Tab by Mouth 3 Times Daily. PRN as needed   TYPE-IN DME 4 PRONG CANE - Dx: G95.89, R26.9       Allergies:   Allergies   Allergen Reactions   . Montelukast rash/itching and swelling   . Nitroglycerin hives and rash/itching   . Immune Globulin  (Human) (Igg) swelling and rash/itching     Pt complain of itchiness and swelling of lips and tongue. Saturation fine, bp elevated.   . Magnesium  Sulfate rash/itching     Can take magnesium  supplement   . Amoxicillin hives     Has tolerated ceftriaxone, cephalexin   . Aspirin  rash/itching     Patient states she was told to not take aspirin  d/t daily eliquis  use. Patient also states she itches all over with medication   . Atorvastatin  unknown and other/intolerance     Rhabdomyolysis   Pt states she thinks it caused her to have rhabdo   . Bumex  [Bumetanide] muscle/joint pain   . Ceftriaxone rash/itching   . Magnesium  Chloride rash/itching   . Sulfamethoxazole-Trimethoprim rash/itching   . Iodinated Contrast Media swelling     Throat swelling and lip itching   . Penicillins hives  Has tolerated cephalexin   . Tetracyclines hives   . Latex, Natural Rubber rash/itching and hives   . Kit Prep Of Tc-70m-Tetrofosmin unknown

## 2024-03-28 ENCOUNTER — Telehealth: Payer: Self-pay | Admitting: Neurology

## 2024-03-28 NOTE — Telephone Encounter (Signed)
 Provider has been notified.

## 2024-03-28 NOTE — Telephone Encounter (Signed)
 Copied from CRM #[id number]. Topic: Clinical Support - Speak With Nurse  >> Mar 28, 2024 11:53 AM Ardena GRADE wrote:  Patient called in and wanted to let the provider know that she suffered 6 seizures yesterday according to her daughter. She does currently see a neurologist at another office but wanted to  to inform Dr. Deatrice as well.

## 2024-03-31 NOTE — Unmapped External Note (Signed)
 Reviewed patient urine culture.  Growing Klebsiella species that appears sensitive to most antibiotics.  The patient does have numerous antibiotic allergies but it appears Keflex or ciprofloxacin  would be appropriate.  I called to the patient's listed phone number.  The patient answered and I confirmed her identity.  I explained the results of the urine culture and my recommendation for antibiotic if the patient is having symptoms.  She reports since being in the emergency department, she has had some mild discomfort with urination as well as some urgency/frequency.  I will initiate treatment with Keflex.  Patient verbalized understanding and agreement with plan.

## 2024-04-03 NOTE — Progress Notes (Signed)
 Comprehensive Evaluation- Service Date: 04/03/24  Assessment & Plan      (R56.9) Seizure (HCC)    (E66.811,  E66.09,  Z68.32) Class 1 obesity due to excess calories with serious comorbidity and body mass index (BMI) of 32.0 to 32.9 in adult - Plan: REFERRAL TO NUTRITION, VENIPUNCTURE    (R23.2) Hot flashes - Plan: VENIPUNCTURE, DISCONTINUED: venlafaxine ER (EFFEXOR XR) 37.5 mg PO CP24    (E87.6) Hypokalemia - Plan: Comprehensive Metabolic Panel, CBC with Differential, CBC with Differential, Comprehensive Metabolic Panel, VENIPUNCTURE           Clonidine for hot flashes due to interactions of Effexor  with Skelaxin   Discussed possibility of Topiramate  for weight loss.  Advised her to check with her Neurologist.  Encouraged dietary changes, and exercise  ER records and labs reviewed by me.      DOCUMENTATION REVIEW:    MEDICATIONS AND ALLERGIES WERE REVIEWED AND UPDATED IN EPIC TODAY  SMOKING HISTORY WAS REVIEWED AND UPDATED IN EPIC TODAY  THE PATIENTS PROBLEM LIST WAS REVIEWED AND UPDATED IN EPIC TODAY        No follow-ups on file.         Chief Complaint         Patient presents with   . HOSPITAL F/U     03/27/2024 Seizure disorder        History of Present Illness        Pt was seen in the ER on 03/27/2024  Pt dtr reported that she had 6 seizures back to back.  Neuro f/u is on June 30.    She has had hot flashes for a long time.  She has then daily. She sweats.  She states it seems to have come up in the last year   She is not sure if it started after a particular med was started.      Last cardiology appt was 1 month ago    Trouble losing weight.  She walks some  She states she has some numbness with walking and has had some trouble with ehr legs since the last seizure episode  She has numbness in her toes    Review of Systems   Review of Systems    Home Medications     Outpatient Medications Marked as Taking for the 04/03/24 encounter (Office Visit) with Trudy Mina, MD   Medication Sig Dispense Refill   .  apixaban  (ELIQUIS ) 5 mg PO TABS Take 1 Tab by Mouth Twice Daily.     . cephALEXin (KEFLEX) 500 mg PO CAPS Take 1 Cap by Mouth Every 12 Hours for 7 days. 14 Cap 0   . dapagliflozin propanediol (FARXIGA) 10 mg PO TABS Take 1 Tab by Mouth Once a Day. 30 Tab 0   . lamoTRIgine  (LAMICTAL ) 100 mg PO TABS Take 1 Tab by Mouth Once a Day. Indications: epilepsy 30 Tab 0   . lurasidone  (LATUDA ) 40 mg PO TABS TAKE 1 TABLET DAILY WITH FOOD (AT LEAST 350 CALORIES)     . MAGNESIUM  GLYCINATE PO Take 100 mg by Mouth Once a Day. Indications: patient states she takes this over the counter     . metoprolol  XL (TOPROL  XL) 25 mg PO TB24 Take 1 Tab by Mouth Once a Day.     . Mycophenolate  Sodium 360 mg PO TBEC Take 4 Tabs by Mouth Twice Daily.     . pantoprazole  (PROTONIX ) 40 mg PO TBEC TAKE 1 TABLET BY MOUTH TWICE A DAY 180  Tab 1   . promethazine  (PHENERGAN ) 25 mg PO TABS Take 1 Tab by Mouth Take As Needed.     . QUEtiapine  SR (SEROQUEL  XR) 50 mg PO TB24 Take 1 Tab by Mouth Every 24 Hours. Indications: manic-depression     . rOPINIRole  (REQUIP ) 0.5 mg PO TABS TAKE 2 TABS BY MOUTH EVERY NIGHT AT BEDTIME. 180 Tab 1   . torsemide (DEMADEX) 20 mg PO TABS Take 1 Tab by Mouth Once a Day. 30 Tab 0       Allergies     Allergies   Allergen Reactions   . Montelukast rash/itching and swelling   . Nitroglycerin hives and rash/itching   . Immune Globulin  (Human) (Igg) swelling and rash/itching     Pt complain of itchiness and swelling of lips and tongue. Saturation fine, bp elevated.   . Magnesium  Sulfate rash/itching     Can take magnesium  supplement   . Amoxicillin hives     Has tolerated ceftriaxone, cephalexin   . Aspirin  rash/itching     Patient states she was told to not take aspirin  d/t daily eliquis  use. Patient also states she itches all over with medication   . Atorvastatin  unknown and other/intolerance     Rhabdomyolysis   Pt states she thinks it caused her to have rhabdo   . Bumex [Bumetanide] muscle/joint pain   . Ceftriaxone rash/itching    . Magnesium  Chloride rash/itching   . Sulfamethoxazole-Trimethoprim rash/itching   . Iodinated Contrast Media swelling     Throat swelling and lip itching   . Penicillins hives     Has tolerated cephalexin   . Tetracyclines hives   . Latex, Natural Rubber rash/itching and hives   . Kit Prep Of Tc-17m-Tetrofosmin unknown       Past Surgical History     Past Surgical History:   Procedure Laterality Date   . BREAST BIOPSY Right 06/18/2021    right ultrasound core biopsy with clip placed   . CARDIAC CATH  2016, 02/2016   . CAROTID ENDARTERECTOMY     . COLONOSCOPY N/A 07/01/2013    Procedure: COLONOSCOPY;  Surgeon: Neysa Katz, MD;  Location: [facility] ENDO OR LOC;  Service: Endoscopy GI;  Laterality: N/A;   . COLONOSCOPY N/A 04/13/2015    Procedure: COLONOSCOPY FLEXIBLE;  Surgeon: Glendia Sherry, MD;  Location: [facility] ENDO OR LOC;  Service: Endoscopy GI;  Laterality: N/A;   . COLONOSCOPY WITH BIOPSY N/A 04/13/2015    Procedure: COLONOSCOPY FLEXIBLE WITH BIOPSY;  Surgeon: Glendia Sherry, MD;  Location: [facility] ENDO OR LOC;  Service: Endoscopy GI;  Laterality: N/A;   . COLONOSCOPY WITH BIOPSY N/A 01/31/2017    Procedure: COLONOSCOPY FLEXIBLE WITH BIOPSY;  Surgeon: Vicci Emery DEL, MD   . COLONOSCOPY WITH BIOPSY N/A 05/29/2017    Procedure: COLONOSCOPY, WITH BIOPSY;  Surgeon: Josepha Marinell BROCKS, MD   . COLONOSCOPY WITH BIOPSY N/A 05/29/2017    Procedure: COLONOSCOPY, WITH BIOPSY;  Surgeon: Josepha Marinell BROCKS, MD   . EGD N/A 07/01/2013    Procedure: ESOPHAGOGASTRODUODENOSCOPY;  Surgeon: Neysa Katz, MD;  Location: [facility] ENDO OR LOC;  Service: Endoscopy GI;  Laterality: N/A;   . EGD N/A 08/08/2014    Procedure: ESOPHAGOGASTRODUODENOSCOPY FLEXIBLE TRANSORAL DIAGNOSTIC;  Surgeon: Glendia Copier, MD;  Location: [facility] ENDO OR LOC;  Service: Endoscopy GI;  Laterality: N/A;   . EGD WITH BIOPSY N/A 06/07/2016    Procedure: ESOPHAGOGASTRODUODENOSCOPY FLEXIBLE TRANSORAL WITH BIOPSY;  Surgeon: Vicci Emery DEL, MD   . EGD WITH  BIOPSY  N/A 03/20/2020    Procedure: EGD, WITH BIOPSY;  Surgeon: Josepha Marinell BROCKS, MD   . EGD WITH BLEEDING CONTROL N/A 05/29/2017    Procedure: EGD, WITH HEMORRHAGE CONTROL;  Surgeon: Josepha Marinell BROCKS, MD   . EXCISION OF BENIGN LESION Right 10/19/2021    Procedure: EXCISION, LESION, BENIGN, BREAST;  Surgeon: Clance Rosealee HERO, MD   . HEART CATHETERIZATION     . HYSTERECTOMY     . LOOP ELECTROSURGICAL EXCISON PROCEDURE     . LOOP RECORDER  2018   . OTHER Right 06/18/2021    axillary lymph node biopsy with clip placed   . OTHER  05/2021    Left leg ablation procedure   . VENOUS ACCESS CATHETER INSERTION Right 03/29/2019        Past Medical History     Past Medical History:   Diagnosis Date   . Acute, but ill-defined, cerebrovascular disease     TIA   . Arthropathy     knees   . Asthma    . Bipolar disorder (HCC)    . Cardiac arrhythmia     h/o Afib on Eliquis    . Cerebral artery occlusion with cerebral infarction [facility])     CVA post endodartorectomy   . Chest pain     has history multiple admissions for this, stress test and cardiac cath negative   . Chronic anemia     Hgb ~10   . Chronic obstructive pulmonary disease (HCC) 01/19/2017   . COPD (chronic obstructive pulmonary disease) (HCC)    . Cough    . COVID    . COVID-19 vaccine series completed    . CVA (cerebral vascular accident) (HCC) 09/01/2021   . Deficiency anemia 11/2020    rx with Iron Infusions   . Epilepsy (HCC)     per pt last Sz was approx 9-10 months ago   . Esophageal reflux    . Headache    . 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 Hemorrhoids    . History of blood transfusion     following one of her pregnancy   . History of cardiac catheterization no obstructive cad in 2017    . Hyperlipidemia    . Hyperlipidemia    . Hypertension    . Lower leg edema    . Migraines    . Paroxysmal atrial fibrillation (HCC)     on anticoagulation   . Seizure-like activity (HCC)     EEG negative   .  Stable angina pectoris 02/15/2018   . Stiff person syndrome    . Syncope     recurrent, has loop recorder   . Thoracic aortic aneurysm    . TIA (transient ischemic attack)     multiple since age 67's per patient. Multiple MRI brain in the past negative for evidence of stroke. Suspected complicated migraine.   . Type 2 diabetes mellitus (HCC)     not on medication   . Unable to coordinate sucking, swallowing, and breathing 09/28/2019       Family History     Family History   Problem Relation Age of Onset   . Hypertension Mother    . Diabetes Mother    . Hypertension Sister    . Diabetes Brother    . Hypertension Brother    . Other Family History Father         Unknown to patient   . Anesthesia Reaction Neg  Hx        Social History     Social History     Occupational History   . Not on file   Tobacco Use   . Smoking status: Never     Passive exposure: Never   . Smokeless tobacco: Never   Vaping Use   . Vaping status: Never Used   Substance and Sexual Activity   . Alcohol use: No     Alcohol/week: 0.0 standard drinks of alcohol   . Drug use: No   . Sexual activity: Not Currently       E-Cigarette Use: Never User   Start Date:    Quit Date:    Passive Exposure:    Counseling Given:    Comments:    Nicotine:    THC:    CBD:     Flavoring:    Other:          The patient's medical, family, and social history (including tobacco usage) were reviewed and updated as appropriate.    Tobacco History reviewed:  Social History     Tobacco Use   Smoking Status Never   . Passive exposure: Never   Smokeless Tobacco Never      Counseling given: No      Physical Exam   Vital Signs: BP 128/70 (Site: Arm Upper L, Position: Sitting, Cuff Size: Medium)   Pulse 83   Temp 96.6 F (35.9 C) (Temporal)   Ht 5' 5 (1.651 m)   Wt 88.5 kg (195 lb)   LMP 10/13/1979   SpO2 100%   BMI 32.45 kg/m      Physical Exam  Constitutional:       Appearance: Normal appearance. She is obese.   HENT:      Head: Normocephalic and atraumatic.   Eyes:       Extraocular Movements: Extraocular movements intact.      Conjunctiva/sclera: Conjunctivae normal.   Cardiovascular:      Rate and Rhythm: Normal rate and regular rhythm.      Heart sounds: Normal heart sounds.   Pulmonary:      Effort: Pulmonary effort is normal.      Breath sounds: Normal breath sounds.   Abdominal:      General: Bowel sounds are normal.      Palpations: Abdomen is soft.   Neurological:      General: No focal deficit present.      Mental Status: She is alert and oriented to person, place, and time.   Psychiatric:         Mood and Affect: Mood normal.         Behavior: Behavior normal.         Thought Content: Thought content normal.         Judgment: Judgment normal.              Recent Results & Studies   I have reviewed pertinent labs. Recent Labs:  Results for orders placed or performed during the hospital encounter of 03/27/24   Urine Culture & Sensitvity    Specimen: Clean Catch Urine   Result Value Ref Range    Culture Greater than 100,000 ORG/ML Klebsiella pneumoniae (A)        Susceptibility    Klebsiella pneumoniae -  (no method available)*     Ampicillin 16.0 Resistant      Ampicillin/Sulbactam <=2.0 Sensitive      Cefazolin (Urine) <=1.0 Sensitive      Cefazolin (  non Urine) <=1.0 Sensitive      Ceftriaxone <=0.25 Sensitive      Ciprofloxacin  <=0.06 Sensitive      Gentamicin <=1.0 Sensitive      Levofloxacin  <=0.12 Sensitive      Nitrofurantoin 64.0 Intermediate      Trimethoprim/Sulfamethoxazole <=20.0 Sensitive      * The reporting of CLSI breakpoints for susceptibility testing has been developed and the performance characteristics were determined by [employer].  Reporting CLSI breakpoints is not considered an FDA-approved test. The performance characteristics meet the quality assurance standards established by CLIA. Documentation is on file within [employer].   CG4+ i-STAT (Lab)   Result Value Ref Range    SAMPLE TYPE VENOUS     LACT V POC 2.25 (H)  0.90 - 2.00 mmol/L    Cartridge type CG4+     PATIENT TEMPERATURE 97.6 F    EKG 12 LEAD UNIT PERFORMED   Result Value Ref Range    Heart Rate 80 bpm    RR Interval 748 ms    Atrial Rate 80 ms    P-R Interval 162 ms    P Duration 122 ms    P Horizontal Axis -11 deg    P Front Axis 39 deg    Q Onset 499 ms    QRSD Interval 89 ms    QT Interval 359 ms    QTcB 415 ms    QTcF 395 ms    QRS Horizontal Axis -8 deg    QRS Axis 32 deg    I-40 Front Axis 25 deg    t-40 Horizontal Axis -13 deg    T-40 Front Axis 40 deg    T Horizontal Axis  deg    T Wave Axis 254 deg    S-T Horizontal Axis 173 deg    S-T Front Axis 230 deg    Impression - BORDERLINE ECG -     Impression SR-Sinus rhythm-normal P axis, V-rate 50-99     Impression       ET-Abnormal R-wave progression, early transition-QRS area>0 in V2    Impression       REPB-Borderline  repolarization abnormality-ST dep & abnormal T    Impression       -No significant change since prior tracing, 02/11/2024-   COMPREHENSIVE METABOLIC PANEL   Result Value Ref Range    Potassium 3.3 (L) 3.5 - 5.5 mmol/L    Sodium 139 133 - 145 mmol/L    Chloride 96 (L) 98 - 110 mmol/L    Glucose 99 70 - 99 mg/dL    Calcium  9.8 8.4 - 10.5 mg/dL    Albumin  4.3 3.5 - 5.0 g/dL    SGPT (ALT) 22 5 - 40 U/L    SGOT (AST) 24 10 - 37 U/L    Bilirubin Total 0.2 0.2 - 1.2 mg/dL    Alkaline Phosphatase 118 40 - 120 U/L    BUN 11 6 - 22 mg/dL    CO2 32 20 - 32 mmol/L    Creatinine 0.9 0.8 - 1.4 mg/dL    eGFR 33.6 >39.9 fO/fpw/8.26 sq.m.    Globulin 2.6 2.0 - 4.0 g/dL    A/G Ratio 1.7 1.1 - 2.6 ratio    Total Protein 6.9 6.2 - 8.1 g/dL    Anion Gap 88.9 3.0 - 15.0 mmol/L   TROPONIN   Result Value Ref Range    Troponin (T) Quant High Sensitivity (5th Gen) 8 0 - 19 ng/L  ALCOHOL ETHYL   Result Value Ref Range    Ethanol Serum <=0.01 <=0.02 g/dL   ACETAMINOPHEN  LEVEL   Result Value Ref Range    Acetaminophen  <5 (L) 10 - 30 mcg/mL   SALICYLATE LEVEL   Result Value Ref Range    Salicylate <1 (L) 15 - 30 mg/dL   CBC  WITH DIFFERENTIAL AUTO   Result Value Ref Range    WBC 7.6 4.0 - 11.0 K/uL    RBC 4.24 3.80 - 5.20 M/uL    HGB 10.9 (L) 11.7 - 16.1 g/dL    HCT 65.3 (L) 64.8 - 48.3 %    MCV 82 80 - 99 fL    MCH 26 26 - 34 pg    MCHC 32 31 - 36 g/dL    RDW 85.0 89.9 - 84.4 %    Platelet 336 140 - 440 K/uL    MPV 9.6 9.0 - 13.0 fL    Segmented Neutrophils (Auto) 51 40 - 75 %    Lymphocytes (Auto) 40 20 - 45 %    Monocytes (Auto) 7 3 - 12 %    Eosinophils (Auto) 2 0 - 6 %    Basophils (Auto) 1 0 - 2 %    Absolute Neutrophils (Auto) 3.9 1.8 - 7.7 K/uL    Absolute Lymphocytes (Auto) 3.0 1.0 - 4.8 K/uL    Absolute Monocytes (Auto) 0.6 0.1 - 1.0 K/uL    Absolute Eosinophils (Auto) 0.1 0.0 - 0.5 K/uL    Absolute Basophils (Auto) 0.0 0.0 - 0.2 K/uL   Urinalysis w Micro Reflex Culture    Specimen: Clean Catch Urine   Result Value Ref Range    Source Urine      Urine Color Yellow Colorless, Pale Yellow, Light Yellow, Yellow, Dark Yellow, Straw    Urine Clarity Cloudy (A) Clear, Slightly Cloudy    Urine pH 5.0 5.0 - 8.0 pH    Urine Protein Screen Negative Negative, Trace mg/dL    Urine Glucose 499* (A) Negative mg/dL    Urine Ketones Trace (A) Negative mg/dL    Urine Occult Blood Moderate (A) Negative    Urine Specific Gravity 1.018 1.005 - 1.030    Urine Nitrite Positive (A) Negative    Urine Leukocyte Esterase Moderate (A) Negative    Urine Bilirubin Negative Negative    Urine Urobilinogen 0.2 <2.0 mg/dL mg/dL    Urine RBC 0-2 Negative, 0-2 /hpf    Urine WBC 51-100 (A) 0 - 5 /hpf    Urine Bacteria Present (A) Negative    Urine Yeast Present (A) (none)    Squamous Epithelial Cells 0-2 None, 0-2 /hpf    Hyaline Cast 0-2 0 - 2 /lpf   Urine Drug Screen 9   Result Value Ref Range    Amphetamine Screen NDET NDET    Opiate Screen DET (A) NDET    Cocaine Screen NDET NDET    Cannabinoids Screen NDET NDET    Barbiturates Screen Urine NDET NDET    Benzodiazepine Screen DET (A) NDET    Hydrocodone Screen Urine NDET NDET    Oxycodone  Screen NDET NDET     6-Acetylmorphine Screen NDET NDET    pH Urine Drug 5.0 4.5 - 8.0    SPECIFIC GRAVITY URINE 1.018 1.003 - 1.030    Creatinine Urine mg/dL 863 mg/dL   EDIE VALUES   Result Value Ref Range    EDIE SECURITY      EDIE PDMP 1  EDIE CAREPLAN      EDIE FREQUENCY 1     EDIE FACILITY       *Note: Due to a large number of results and/or encounters for the requested time period, some results have not been displayed. A complete set of results can be found in Results Review.              I have reviewed information entered by the clinical staff and/or patient and verified it as accurate or edited where necessary.    Signature:  [name], MD  [facility]   Dept: [phone]  Dept Fax: [phone]

## 2024-04-09 NOTE — ED Provider Notes (Signed)
 [facility]   --------------------------------------------------------------------------------------------------------------    Time of Arrival:   04/09/24 1323      Chief Complaint   Patient presents with   . CHEST PAIN (ADULT)       HPI  68 year old female presents with chest pain.  States it started last night.  States it radiates to her left arm.  Patient does have a history of thoracic aortic aneurysm.  States that they have not followed up with it.  States that she also history of A-fib.  Is on Xarelto.  Denies any shortness of breath.  Admits to nausea no vomiting              Physical Exam  Constitutional:       Appearance: Normal appearance.   HENT:      Head: Normocephalic.      Nose: Nose normal.   Eyes:      Extraocular Movements: Extraocular movements intact.      Conjunctiva/sclera: Conjunctivae normal.   Cardiovascular:      Rate and Rhythm: Normal rate.      Pulses: Normal pulses.   Pulmonary:      Effort: Pulmonary effort is normal.   Abdominal:      General: Abdomen is flat. There is no distension.   Musculoskeletal:         General: Normal range of motion.      Cervical back: Normal range of motion.   Lymphadenopathy:      Cervical: No cervical adenopathy.   Skin:     General: Skin is warm.      Capillary Refill: Capillary refill takes less than 2 seconds.   Neurological:      General: No focal deficit present.      Mental Status: She is alert.   Psychiatric:         Mood and Affect: Mood normal.         Past Medical History:   Diagnosis Date   . Acute, but ill-defined, cerebrovascular disease     TIA   . Arthropathy     knees   . Asthma    . Bipolar disorder (HCC)    . Cardiac arrhythmia     h/o Afib on Eliquis    . Cerebral artery occlusion with cerebral infarction [facility])     CVA post endodartorectomy   . Chest pain     has history multiple admissions for this, stress test and cardiac cath negative   . Chronic anemia     Hgb ~10   . Chronic obstructive pulmonary  disease (HCC) 01/19/2017   . COPD (chronic obstructive pulmonary disease) (HCC)    . Cough    . COVID    . COVID-19 vaccine series completed    . CVA (cerebral vascular accident) (HCC) 09/01/2021   . Deficiency anemia 11/2020    rx with Iron Infusions   . Epilepsy (HCC)     per pt last Sz was approx 9-10 months ago   . Esophageal reflux    . Headache    . 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 Hemorrhoids    . History of blood transfusion     following one of her pregnancy   . History of cardiac catheterization no obstructive cad in 2017    . Hyperlipidemia    . Hyperlipidemia    . Hypertension    . Lower leg edema    .  Migraines    . Paroxysmal atrial fibrillation (HCC)     on anticoagulation   . Seizure-like activity (HCC)     EEG negative   . Stable angina pectoris 02/15/2018   . Stiff person syndrome    . Syncope     recurrent, has loop recorder   . Thoracic aortic aneurysm    . TIA (transient ischemic attack)     multiple since age 38's per patient. Multiple MRI brain in the past negative for evidence of stroke. Suspected complicated migraine.   . Type 2 diabetes mellitus (HCC)     not on medication   . Unable to coordinate sucking, swallowing, and breathing 09/28/2019     Past Surgical History:   Procedure Laterality Date   . BREAST BIOPSY Right 06/18/2021    right ultrasound core biopsy with clip placed   . CARDIAC CATH  2016, 02/2016   . CAROTID ENDARTERECTOMY     . COLONOSCOPY N/A 07/01/2013    Procedure: COLONOSCOPY;  Surgeon: Neysa Katz, MD;  Location: [facility] ENDO OR LOC;  Service: Endoscopy GI;  Laterality: N/A;   . COLONOSCOPY N/A 04/13/2015    Procedure: COLONOSCOPY FLEXIBLE;  Surgeon: Glendia Sherry, MD;  Location: [facility] ENDO OR LOC;  Service: Endoscopy GI;  Laterality: N/A;   . COLONOSCOPY WITH BIOPSY N/A 04/13/2015    Procedure: COLONOSCOPY FLEXIBLE WITH BIOPSY;  Surgeon: Glendia Sherry, MD;  Location: [facility] ENDO OR  LOC;  Service: Endoscopy GI;  Laterality: N/A;   . COLONOSCOPY WITH BIOPSY N/A 01/31/2017    Procedure: COLONOSCOPY FLEXIBLE WITH BIOPSY;  Surgeon: Vicci Emery DEL, MD   . COLONOSCOPY WITH BIOPSY N/A 05/29/2017    Procedure: COLONOSCOPY, WITH BIOPSY;  Surgeon: Josepha Marinell BROCKS, MD   . COLONOSCOPY WITH BIOPSY N/A 05/29/2017    Procedure: COLONOSCOPY, WITH BIOPSY;  Surgeon: Josepha Marinell BROCKS, MD   . EGD N/A 07/01/2013    Procedure: ESOPHAGOGASTRODUODENOSCOPY;  Surgeon: Neysa Katz, MD;  Location: [facility] ENDO OR LOC;  Service: Endoscopy GI;  Laterality: N/A;   . EGD N/A 08/08/2014    Procedure: ESOPHAGOGASTRODUODENOSCOPY FLEXIBLE TRANSORAL DIAGNOSTIC;  Surgeon: Glendia Copier, MD;  Location: [facility] ENDO OR LOC;  Service: Endoscopy GI;  Laterality: N/A;   . EGD WITH BIOPSY N/A 06/07/2016    Procedure: ESOPHAGOGASTRODUODENOSCOPY FLEXIBLE TRANSORAL WITH BIOPSY;  Surgeon: Vicci Emery DEL, MD   . EGD WITH BIOPSY N/A 03/20/2020    Procedure: EGD, WITH BIOPSY;  Surgeon: Josepha Marinell BROCKS, MD   . EGD WITH BLEEDING CONTROL N/A 05/29/2017    Procedure: EGD, WITH HEMORRHAGE CONTROL;  Surgeon: Josepha Marinell BROCKS, MD   . EXCISION OF BENIGN LESION Right 10/19/2021    Procedure: EXCISION, LESION, BENIGN, BREAST;  Surgeon: Clance Rosealee HERO, MD   . HEART CATHETERIZATION     . HYSTERECTOMY     . LOOP ELECTROSURGICAL EXCISON PROCEDURE     . LOOP RECORDER  2018   . OTHER Right 06/18/2021    axillary lymph node biopsy with clip placed   . OTHER  05/2021    Left leg ablation procedure   . VENOUS ACCESS CATHETER INSERTION Right 03/29/2019     Family History   Problem Relation Age of Onset   . Hypertension Mother    . Diabetes Mother    . Hypertension Sister    . Diabetes Brother    . Hypertension Brother    . Other Family History Father         Unknown to patient   .  Anesthesia Reaction Neg Hx      Social History     Occupational History   . Not on file   Tobacco Use   . Smoking status: Never     Passive exposure: Never   . Smokeless tobacco: Never    Vaping Use   . Vaping status: Never Used   Substance and Sexual Activity   . Alcohol use: No     Alcohol/week: 0.0 standard drinks of alcohol   . Drug use: No   . Sexual activity: Not Currently       No outpatient medications have been marked as taking for the 04/09/24 encounter [facility] Encounter).     Allergies   Allergen Reactions   . Montelukast rash/itching and swelling   . Nitroglycerin hives and rash/itching   . Immune Globulin  (Human) (Igg) swelling and rash/itching     Pt complain of itchiness and swelling of lips and tongue. Saturation fine, bp elevated.   . Magnesium  Sulfate rash/itching     Can take magnesium  supplement   . Amoxicillin hives     Has tolerated ceftriaxone, cephalexin   . Aspirin  rash/itching     Patient states she was told to not take aspirin  d/t daily eliquis  use. Patient also states she itches all over with medication   . Atorvastatin  unknown and other/intolerance     Rhabdomyolysis   Pt states she thinks it caused her to have rhabdo   . Bumex [Bumetanide] muscle/joint pain   . Ceftriaxone rash/itching   . Magnesium  Chloride rash/itching   . Sulfamethoxazole-Trimethoprim rash/itching   . Penicillins hives     Has tolerated cephalexin   . Tetracyclines hives   . Iodinated Contrast Media hives     Updated per Dr. Lorn who confirmed with pt.    . Latex, Natural Rubber rash/itching and hives   . Kit Prep Of Tc-29m-Tetrofosmin unknown           MDM/EDCOURSE    MDM Data  External documents reviewed: Previous admissions, ER visits, outpatient notes, nursing reports, imaging and labs if applicable   My EKG interpretation:        ED Course as of 04/10/24 0015   Wed Apr 10, 2024   3255 68 year old female presented with symptoms as above.  EKG was obtained showing sinus rhythm at a rate of 64 normal intervals normal axis no ST elevations.  Workup is negative.  CT scan not showing any emergent pathology.  Patient is okay for discharge [AT]       PROCEDURES  Procedures    Vital Signs:  Patient  Vitals for the past 72 hrs:   Temp Heart Rate Pulse Resp BP BP Mean SpO2 Weight   04/09/24 2017 97 F (36.1 C) 66 66 22 132/72 92 MM HG 96 % --   04/09/24 1815 -- 65 -- 16 130/61 84 MM HG -- --   04/09/24 1700 -- 67 -- 18 122/71 88 MM HG 99 % --   04/09/24 1647 -- -- -- -- -- -- -- 86.2 kg (190 lb)   04/09/24 1327 96.9 F (36.1 C) 70 -- 16 136/63 87 MM HG 100 % --       Documentation Review:  Records review: Old medical records:       Diagnostics:  Labs:    Results for orders placed or performed during the hospital encounter of 04/09/24   Basic Metabolic Panel   Result Value Ref Range    Potassium 3.0 (L) 3.5 -  5.5 mmol/L    Sodium 139 133 - 145 mmol/L    Chloride 97 (L) 98 - 110 mmol/L    Glucose 124 (H) 70 - 99 mg/dL    Calcium  8.9 8.4 - 10.5 mg/dL    BUN 5 (L) 6 - 22 mg/dL    Creatinine 1.0 0.8 - 1.4 mg/dL    CO2 30 20 - 32 mmol/L    eGFR 59.0 (L) >60.0 mL/min/1.73 sq.m.    Anion Gap 12.0 3.0 - 15.0 mmol/L   TROPONIN   Result Value Ref Range    Troponin (T) Quant High Sensitivity (5th Gen) <6 0 - 19 ng/L   CBC WITH DIFFERENTIAL AUTO   Result Value Ref Range    WBC 6.9 4.0 - 11.0 K/uL    RBC 4.21 3.80 - 5.20 M/uL    HGB 10.8 (L) 11.7 - 16.1 g/dL    HCT 64.6 64.8 - 51.6 %    MCV 84 80 - 99 fL    MCH 26 26 - 34 pg    MCHC 31 31 - 36 g/dL    RDW 85.2 89.9 - 84.4 %    Platelet 314 140 - 440 K/uL    MPV 10.4 9.0 - 13.0 fL    Segmented Neutrophils (Auto) 56 40 - 75 %    Lymphocytes (Auto) 36 20 - 45 %    Monocytes (Auto) 6 3 - 12 %    Eosinophils (Auto) 2 0 - 6 %    Basophils (Auto) 0 0 - 2 %    Absolute Neutrophils (Auto) 3.8 1.8 - 7.7 K/uL    Absolute Lymphocytes (Auto) 2.5 1.0 - 4.8 K/uL    Absolute Monocytes (Auto) 0.4 0.1 - 1.0 K/uL    Absolute Eosinophils (Auto) 0.1 0.0 - 0.5 K/uL    Absolute Basophils (Auto) 0.0 0.0 - 0.2 K/uL         CT CTA CHEST, ABD, PELVIS   Final Result   IMPRESSION:   1.  Negative aorta with no aneurysm or dissection.   2.  No acute findings in the chest, abdomen or pelvis.   3.  Large  colonic stool burden.   Electronically signed by: Lorie Stumpo, MD  Board Certified    Radiologist 04/10/2024 12:02 AM EDT      CHEST PA AND LATERAL   Final Result   IMPRESSION:   No acute findings.   Electronically signed by: Debby JINNY Pedlar, MD [facility]  Board    Certified Radiologist 04/09/2024 2:17 PM EDT      EKG 12 LEAD UNIT PERFORMED   Final Result              Final diagnoses:   [R07.9] Chest pain, unspecified type (Primary)     New Prescriptions    No medications on file         .

## 2024-04-12 NOTE — Telephone Encounter (Signed)
 Patient requesting Medication refill.      Medication Name ropininrole    Last Ordered 08-14-2023     Medication Refill Policy Required Information      Last visit with provider was 04/03/2024      Next appt in this department: 05/03/2024    VERIFIED:        Allergies   Allergen Reactions   . Montelukast rash/itching and swelling   . Nitroglycerin hives and rash/itching   . Immune Globulin  (Human) (Igg) swelling and rash/itching     Pt complain of itchiness and swelling of lips and tongue. Saturation fine, bp elevated.   . Magnesium  Sulfate rash/itching     Can take magnesium  supplement   . Amoxicillin hives     Has tolerated ceftriaxone, cephalexin   . Aspirin  rash/itching     Patient states she was told to not take aspirin  d/t daily eliquis  use. Patient also states she itches all over with medication   . Atorvastatin  unknown and other/intolerance     Rhabdomyolysis   Pt states she thinks it caused her to have rhabdo   . Bumex [Bumetanide] muscle/joint pain   . Ceftriaxone rash/itching   . Magnesium  Chloride rash/itching   . Sulfamethoxazole-Trimethoprim rash/itching   . Penicillins hives     Has tolerated cephalexin   . Tetracyclines hives   . Iodinated Contrast Media hives     Updated per Dr. Lorn who confirmed with pt.    . Latex, Natural Rubber rash/itching and hives   . Kit Prep Of Tc-33m-Tetrofosmin unknown       Current Outpatient Medications on File Prior to Visit   Medication Sig Dispense Refill   . Ammonium Lactate 12 % Top CREA PLEASE SEE ATTACHED FOR DETAILED DIRECTIONS     . apixaban  (ELIQUIS ) 5 mg PO TABS Take 1 Tab by Mouth Twice Daily.     . [EXPIRED] cephALEXin (KEFLEX) 500 mg PO CAPS Take 1 Cap by Mouth Every 12 Hours for 7 days. 14 Cap 0   . cloNIDine HCL (CATAPRES) 0.1 mg PO TABS Take 1/2 tab twice a day 30 Tab 0   . dapagliflozin propanediol (FARXIGA) 10 mg PO TABS Take 1 Tab by Mouth Once a Day. 30 Tab 0   . EPINEPHrine (EPIPEN) 0.3 mg/0.3 mL Inj AtIn Inject 0.3 mL into the muscle Take As  Needed for Other (ANAPHYLAXIS). Q 10-30 minutes prn for ANAPHYLAXIS ONLY 2 Each 1   . lamoTRIgine  (LAMICTAL ) 100 mg PO TABS Take 1 Tab by Mouth Once a Day. Indications: epilepsy 30 Tab 0   . Loperamide 2 mg PO TABS Take 1 Tab by Mouth Every 4 Hours As Needed (loose stools). Not to exceed 8 tablets in 24 hours. (Patient not taking: Reported on 03/28/2024) 30 Tab 0   . lurasidone  (LATUDA ) 40 mg PO TABS TAKE 1 TABLET DAILY WITH FOOD (AT LEAST 350 CALORIES)     . MAGNESIUM  GLYCINATE PO Take 100 mg by Mouth Once a Day. Indications: patient states she takes this over the counter     . metaxalone  (SKELAXIN ) 800 mg PO TABS Take 1 Tab by Mouth Once a Day.     . metoprolol  XL (TOPROL  XL) 25 mg PO TB24 Take 1 Tab by Mouth Once a Day.     . Mycophenolate  Sodium 360 mg PO TBEC Take 4 Tabs by Mouth Twice Daily.     . [DISCONTINUED] ondansetron  (ZOFRAN ) 4 mg PO ODT. Take 1 Tab by Mouth Every 8 Hours As  Needed (PRN FOR NAUSEA AND VOMITING). ALLOW TABLET TO DISSOLVE ON TONGUE. 12 Tab 0   . pantoprazole  (PROTONIX ) 40 mg PO TBEC TAKE 1 TABLET BY MOUTH TWICE A DAY 180 Tab 1   . promethazine  (PHENERGAN ) 25 mg PO TABS Take 1 Tab by Mouth Take As Needed.     . QUEtiapine  SR (SEROQUEL  XR) 50 mg PO TB24 Take 1 Tab by Mouth Every 24 Hours. Indications: manic-depression     . rOPINIRole  (REQUIP ) 0.5 mg PO TABS TAKE 2 TABS BY MOUTH EVERY NIGHT AT BEDTIME. 180 Tab 1   . torsemide (DEMADEX) 20 mg PO TABS Take 1 Tab by Mouth Once a Day. 30 Tab 0   . traMADoL  (ULTRAM ) 50 mg PO TABS Take 1 Tab by Mouth 2 Times Daily As Needed. for pain (Patient taking differently: Take 1 Tab by Mouth 3 Times Daily. PRN as needed) 60 Tab 0   . 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 [DISCONTINUED] venlafaxine ER (EFFEXOR XR) 37.5 mg PO CP24 Take 1 Cap by Mouth Once a Day. 30 Cap 0     Current Facility-Administered Medications on File Prior to Visit   Medication Dose Route Frequency Provider Last Rate Last Admin   . [COMPLETED] acetaminophen  (TylenoL )  tablet 650 mg  650 mg Oral Once Tiesi, Alexander, MD   650 mg at 04/09/24 2052   . [COMPLETED] diphenhydrAMINE  (BenadryL ) injection 50 mg  50 mg Intravenous 1 Hour Prior to Procedure Lorn Blunt, MD   50 mg at 04/09/24 2006   . [COMPLETED] iodixanoL  (VisiPaque ) 320 mg iodine /mL 100 mL  100 mL Intravenous Once Tiesi, Alexander, MD   100 mL at 04/09/24 2113   . [COMPLETED] methylPREDNISolone  SOD SUCC (PF) (SOLU-Medrol ) injection 40 mg  40 mg IV Push 4 Hours Prior to Procedure Lorn Blunt, MD   40 mg at 04/09/24 2007   . [COMPLETED] morphine  injection 2 mg  2 mg IV Push Once Tiesi, Alexander, MD   2 mg at 04/09/24 1815   . [COMPLETED] morphine  injection 2 mg  2 mg IV Push Once Tiesi, Alexander, MD   2 mg at 04/09/24 2053   . [COMPLETED] potassium chloride  (Klor-Con ) packet 40 mEq  40 mEq Oral Once Tiesi, Alexander, MD   40 mEq at 04/09/24 2017

## 2024-04-22 ENCOUNTER — Ambulatory Visit: Attending: Neurology | Admitting: Neurology

## 2024-04-22 ENCOUNTER — Encounter: Payer: Self-pay | Admitting: Neurology

## 2024-04-22 VITALS — BP 121/78 | HR 63 | Resp 20 | Wt 198.0 lb

## 2024-04-22 DIAGNOSIS — E538 Deficiency of other specified B group vitamins: Secondary | ICD-10-CM | POA: Insufficient documentation

## 2024-04-22 DIAGNOSIS — Z79899 Other long term (current) drug therapy: Secondary | ICD-10-CM | POA: Insufficient documentation

## 2024-04-22 DIAGNOSIS — R269 Unspecified abnormalities of gait and mobility: Secondary | ICD-10-CM | POA: Insufficient documentation

## 2024-04-22 DIAGNOSIS — G959 Disease of spinal cord, unspecified: Secondary | ICD-10-CM | POA: Insufficient documentation

## 2024-04-22 DIAGNOSIS — R569 Unspecified convulsions: Secondary | ICD-10-CM | POA: Insufficient documentation

## 2024-04-22 DIAGNOSIS — G9589 Other specified diseases of spinal cord: Secondary | ICD-10-CM | POA: Insufficient documentation

## 2024-04-22 DIAGNOSIS — D84821 Immunodeficiency due to drugs: Secondary | ICD-10-CM | POA: Insufficient documentation

## 2024-04-22 DIAGNOSIS — E559 Vitamin D deficiency, unspecified: Secondary | ICD-10-CM | POA: Insufficient documentation

## 2024-04-22 MED ORDER — MYCOPHENOLIC ACID 360 MG PO TBEC
720.0000 mg | DELAYED_RELEASE_TABLET | Freq: Two times a day (BID) | ORAL | 3 refills | Status: AC
Start: 2024-04-22 — End: 2025-04-22

## 2024-04-22 NOTE — Patient Instructions (Signed)
 [facility]  MULTIPLE SCLEROSIS & NEUROIMMUNOLOGY CENTER  Telephone: [phone]  Fax: [phone]  Send us  a mychart or inbasket message for the fastest response  ~~~~~~~~~~~~~~~~~~~~~~~~~~~~~~~~~~~~~~~~~~~~~~~~~~~~~~~~~~~~~~~~~~    WELCOME TO THE NEUROIMMUNOLOGY CLINIC!    MULTIPLE SCLEROSIS AND NEUROIMMUNE DISEASE UPDATE  (Updated January 02, 2024)     1) Clinic Procedures  We only do in-person visits   For over 2000 years, medicine has been a face to face, personal enterprise -- and the best medicine happens face to face. Although telemedicine is very convenient (and was necessary during the pandemic), I've seen poor outcomes with telemedicine care. In-person evaluations are critical part of you doing well. As such we do not offer video visits. Anything minor that does not require a visit can be handled over the MyChart patient portal.     Regular, ongoing, follow up visits is also important. Each person is different, but we see the average patient every 3-6 months. We may not refill controlled substances if you haven't been seen within 3-6 months, and we may not refill other medications if you haven't been seen within 12 months.    Let us  know if your insurance changes. If you lose insurance and live in [place], [place] offers [facility]. [facility] [place] [facility] [place] & [facility] offer similar for patients living in other areas). See [email or web address]    Reaching us   - Mychart is best. We will respond within 2-3 business days. (We do not use regular email as policy)  - My nurses' direct phone [phone]. The main neurology line is [phone]. Leave a message and we will get back to you in a few days.  - After hours, the main line [phone]) will page the on-call neurologist.    You will probably hear back from my nurses. I communicate with them throughout the day. The advice they give you is coming from me - please take it  seriously.    There is a 2 week turnaround time for any forms.     Emergency / After Hours Care.   If you have questions after hours, call the main number [phone]) and you will reach the on-call neurologist who can help you.    If you have severe, acute or debilitating symptoms, go to the emergency room. But also let us  know via mychart or a phone call. I can best help you if you are at an [facility] hospital. You might not have a choice where the ambulance takes you; if you are not at an [facility] hospital, I'll try to help you the best I can. But I probably can't see everything that's going on.    Urgent appointments are available within a few days. My nurses have access to more appointment slots than the call center does.     Testing. Details matter.  The tests I order are specific and high-quality MS care requires advanced technology    - MRI's: we prefer to work with [facility] radiology or [facility]. Some of our patients are enrolled in studies at [facility], and that works as well. If you have had outside MRI's done previously, please bring the CD's so that we can upload them.    - LABS: Go to the lab indicated on the top of your lab order. Do not take an [facility] lab slip to [facility]- they will do it incorrectly. Do not go to [facility] or your primary care doctor. Most patients are sent to [facility] lab.    -  Results of testing will be communicated once they all return at follow-up visits. Many of the tests are nuanced and must be interpreted within the overall clinical picture. I do not call for results.      2) Testing Phone Numbers  MRI & CT:  [facility] at [phone]  [facility] Fax: [phone]  [facility] at [phone]  For insurance approval, reach out to them first. If there is a denial, let us  know ASAP as we can help.     LP / Spinal Tap: Call [phone] for Specialized Imaging Scheduling. Backup is [facility] at [phone]    Labs: Any  location.  [email or web address]    3) Doing well with Neuroimmune Disease  Symptoms don't tell you how the disease is doing.  You can experience more symptoms even if the disease isn't worsening. Fluctuating symptoms are expected. Environmental factors like infections, extremes of heat or cold, stress (emotional or physiologic), fatigue, infections, other medical diseases, and certain foods may make you feel more symptoms. That's not the disease worsening, it's not more neurologic damage, and it's not a relapse. Those are worsening symptoms. Most neuroimmune patients have good days & bad days. You might need symptomatic medications to help you function well.    Also, some diseases worsen without producing more symptoms.     In general, if you have a question about your symptoms or notice any new symptoms, please reach out.      Monitoring the Disease and Medication Response:  MS and neuroimmune diseases are caused by your body's immune system attacking the nervous system (brain, spinal cord, nerves or muscles). We use immune treatments to prevent more damage. We can measure whether those treatments are working through objective testing - labs, MRI's, EMGs, etc.    For MS, NMO or MOG, we know that everyone develop more lesions, neurologic problems on exam and symptoms unless they are treated (Dr Oneil Alas in [place] proved this). MRI is one of the best ways of detecting more damage - it shows up as a new lesion. The clinical exam and labs can also help us . In MS and MOG, a relapse without symptoms is very common, so we do frequent labs and yearly MRIs to monitor the disease. This is very important to detect subclinical progression of disease.     For other neuroimmune diseases -- eg,  myasthenia, CIDP/GBS, or rheumatologic diseases -- not everyone requires treatment. But we use labs, exam, MRI's and/or EMG to monitor the diseases    Medications are the start, but they aren't  everything. They won't repair damage that already happened, and they may not help symptoms. It is to prevent more damage.     Physical and Cognitive Activity Promote Neurologic Recovery: In order to improve the disease, the brain has to rewire around the damaged nerves. The best way to do this is by being active - physically and mentally. Physical, occupational or cognitive therapy can teach you exercises to address specific problems. Physical activity has been shown to improve energy, mood, and reduce pain. Be as physically and mentally active as you can; learn to pace yourself.    Diet: There is no such thing as an MS diet or anti-inflammatory diet. Foods don't affect MRI lesions or prevent neurologic damage, but a good or bad diet can markedly impact symptoms. My suggestion is to eat healthy - we all know what that is! Diets like the Wahl's protocol or Swank diet have ideas worth  trying, but don't feel obligated to follow them precisely. See if adding fiber, reducing gluten, reducing dairy or eliminating processed foods makes a difference in how you feel.    Smoking makes MS worse - it causes more lesions & bigger lesions.     Cannabis worsens cognitive symptoms of MS (Brain fog).    Vitamins and Supplements  Vitamins can affect neurologic functioning:  - Vitaimn D and MS. High vit D levels correlate with better outcomes in MS patients. Target a total (25-OH) Vit D level between 50-100. Vitamin D3 (CHOLEcalciferol; white pill) works better than Vitamin D2 (ERGOcalciferol; green pill). You can get high doses of vitamin D3 from amazon (eg, 10,000 or 50,000 units).     - Target vit B12 level above 500. Levels below 400 can cause nerve & spinal cord degeneration. Levels above the normal range are perfectly safe.    - Vitamin B6 (pyridoxine) and zinc toxicity cause neuropathy. Do not take B-complex or zinc. Other sources include multi-vitamins, some energy drinks, and other supplements. Check the back of the  bottle, not just the front. Candance Cary CD. Pyridoxine toxicity courtesy of your local health food store Ann Rheum Dis. 2006 Dec;65(12):1666-7]    - marked copper deficiency also causes nerve & spinal cord degeneration    - the data around other supplements like biotin, alpha-lipoic acid, etc is unclear, so I generally don't recommend them    Support Groups  NMSS [place] (via Zoom) Elveria Sever. Her cell is [phone].  MS Alliance of [place] / [place] / Donneta GLENWOOD Elvie Cherilyn [phone]   NMSS [place] Kim Kiggins. [phone]. [email or web address]  NMSS [place]  / online Speak MS To Me. Odella Geralynn Blush. [phone]. [email or web address]. Monday 2 PM    National MS Society [facility]): [email or web address].[email or web address]  Myasthenia Gravis Foundation of [place] (MGFA): MoralGame.si  [employer] for NMO: https://www.sumairafoundation.org/  [employer] for NMO: https://guthyjacksonfoundation.org/  Foundation for Sarcoid Research  DiscFull.com.cy    3) Neurologic Disease, Immune Suppressants and Infections  - MS, NMO, MOG, and GBS/CIDP themselves do not increase your risk of infection (including COVID). Severe myasthenia might increase your risk for lung infections    - Call us  if you develop a fever more than 101 F, have a green-yellow cough longer than 1 week, or if you have recurring or difficult-to-treat infections. Immune suppressants can increase infection risk. In this situation, you will probably require antibiotics (and stronger and longer than normal).    - None of the MS medications increase your COVID risk above that of an untreated patient (COVIMS study)    - Infections can cause more symptoms. That's usually not disease worsening or neurologic damage from infection. That is symptom worsening, similar to what happens with stress or the heat. You will get better a few days after  the infection improves.    - Rarely, COVID can cause neurologic complications. Early on, with severe COVID, we saw a lot of strokes and seizures. We see less of that now. But, we are seeing the emergence of a post-COVID neurologic syndrome that looks like Guillain-Barre syndrome or transverse myelitis. The way to test for this is with a spinal tap (lumbar puncture). Neuro-COVID is treatable.    4) Vaccines  Safe Vaccines, regardless of immunosuppressant  - Pfizer, Moderna, Novavax and Booster COVID vaccines are safe in MS and with all immunosuppressants. (There were issues with J&J and Astra-Zeneca).    - Flu vaccine. Use Flublok brand only (recombinant  hemagluttin quadrivalent). This is per FDA guidance for all neurologic patients. It can be difficult to find and we can give you an RX for it.     - Shingles vaccine. Use Shingrix (RZV, recombinant zoster) vaccine. This is per FDA guidance for all neurologic patients.     - All pneumonia vaccines are safe (Prevnar & Capvaxive )    - Tetanus toxoid and TDAP are ok (TDAP is preferred over DTAP).     Unsafe Vaccines (for any neuroimmune patients)  - Live virus or inactivated virus vaccines are contraindicated in neuro-immunologic patients (Including measles vaccine - this is contraindicated)    - monkeypox vaccines (JYNNEOS and ACAM2000) are contraindicated in MS and patients on immunosupressants.     - RSV vaccines are associated with cases of Guillian Barre syndrome, so I would avoid them.     - J&J and Astra Zeneca COVID vaccine were associated with transverse myelitis    5) Other Notes:  - Mavenclad: The contraindication and timing requirements do not apply to most vaccines. They are only for live virus vaccines, which are contraindicated anwyays    - You don't need to time your vaccinations with infusion dates. There have been theoretical concerns about immunosuppression affecting vaccine effectiveness, but recent research has shown this not to be true.    - MS is  compatible with pregnancy but we need to know if you plan on getting pregnant or become pregnant as we may need to adjust your treatment. Certain disease modifying treatments for MS are better than others for those seeking to become pregnant. Please also let me know when you find out you're pregnant.     - I do not prescribe long term opioids for nerve pain. Prescription opioids lead to worse MS symptoms, with studies showing significantly worse fatigue scores and worse pain scores compared to non-opioid users. Neurological pain is best managed by evidence-based non-opiate neuropathic pain medications. Complicated chronic pain management or non-neurological pain management will be referred to pain clinic.     - I do not recommend the use of marijuana or ingestion of THC /CBD containing products. Studies show marijuana worsen cognitive scores in MS and contribute to worsened brain fog and fatigue. Studies do not support the use of medical marijuana for spasticity or pain given a lack of objective efficacy to relieving spasticity. Topical CBD creams and ointments available over hte counter are safer as very little gets into your bloodstream. Instead, we use a comprehensive evidence-based approach in managing spasticity and pain that is individualized to the patient.      6) DMT Patient Assistance Phone Numbers  If you run into insurance / supply issues with a branded DMT, you may be able to get a temporary supply from the manufacturer. Also call us  & your infusion center.     Briumvi ([employer]). [phone]. Fax [phone]    Soliris & Ultomiris ([employer] One Source). [phone]. Fax [phone]. [email or web address]    Uplizna (Hor[employer] By Your Side) [phone]. Fax [phone]    Enspryng [facility] Access Solution) [phone]. Fax [phone]. Text [phone]    Zilbrysq ([facility], Onward). [phone]. Fax [phone].     Kesimpta (Nov[employer] Alongside). [phone]. Fax  [phone]    Mavenclad (EMD Serono MS Lifelines). [phone]. Fax [phone]    Ocrevus & Ocrevus [facility] [employer]). [phone]. Fax [phone]. Text [phone]    Zeposia ([employer]ristol [employer] One Source) [phone]; Fax [phone]    Vyvgart & Vyvgart Hytrulo (Argenx MyPath) [phone]. Fax [phone]  Infusion Centers  [facility]  [address] [place], [place] [address]  Phone: [phone]    B[facility]lett Bartlett Fax [phone]    [place]  Marval Sammi Lockwood, MD PHD  Director, Neuroimmunology & MS Center  [facility]  [address], [place], [place] [address]    Telephone [phone]  Fax [phone]   The fastest response is via [place]

## 2024-04-22 NOTE — Addendum Note (Signed)
 Addended by: DEATRICE SAMMI HUNT on: 04/22/2024 02:55 PM     Modules accepted: Orders

## 2024-04-22 NOTE — Progress Notes (Addendum)
 [facility]  MULTIPLE SCLEROSIS & NEUROIMMUNOLOGY CENTER  Telephone: [phone]  Fax: [phone]  Send us  a mychart or inbasket message for the fastest response  ~~~~~~~~~~~~~~~~~~~~~~~~~~~~~~~~~~~~~~~~~~~~~~~~~~~~~~~~~~~~~~~~~~~~~~~~~~~~~~~~~~~~  Visit Date: 04/22/2024    Referring Neurologist:     CC: autoimmune dorsal column myelopathy    HPI:   History was obtained from records review, patient,       68 y.o. 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 She is unfortuantely getting headaches and infections. GI issues are getting better.    ER visit 02/13/24 for cellultis / dental infection  ER 03/27/24 with seizure, found to have UTI w/ klebsiella. Most likely etiology of seizure is UTI    She c/o leg stiffness. She takesonly 400mg  metaxolone      EXAM  Visit Vitals  BP 121/78 (BP Site: Left arm, Patient Position: Sitting, Cuff Size: Large)   Pulse 63   Resp 20   Wt 89.8 kg (198 lb)   LMP  (LMP Unknown)   SpO2 95%   BMI 32.95 kg/m     General:   Optic Nerves:   Psychiatric.   Mental Status: The patient was awake, alert, appropriate  Cranial Nerves: CN II-XII wnl  Motor: 5/5  Reflex:   Sensation:   Coordination:   Gait: sways on romberg. Cannot tandem      Imaging     Reports:  CT Head WO Contrast  Result Date: 03/27/2024  Impression:  IMPRESSION: Unremarkable study. Electronically signed by: MARLA Franky Oaks, MD  Board Certified Radiologist 03/27/2024 7:09 PM EDT    MRI Brain WO Contrast  Result Date: 12/03/2023  Impression:  IMPRESSION: 1. No acute intracranial abnormality or significant interval change. 2. Periventricular and predominantly subcortical T2 hyperintensities bilaterally are moderately advanced for age. The finding is nonspecific but can be seen in the setting of chronic microvascular ischemia, a demyelinating process such as multiple sclerosis, vasculitis, complicated migraine headaches, or as the sequelae of a prior infectious or inflammatory process. Electronically Signed   By:  Lonni Necessary M.D.   On: 12/03/2023 10:29    CT Head WO Contrast  Result Date: 12/01/2023  Impression:  IMPRESSION: No acute intracranial abnormality. Electronically Signed   By: Suzen Dials M.D.   On: 12/01/2023 20:45       Testing       Labs  Lab Results   Component Value Date    VITB6 3.6 05/30/2023    HGBA1C 6.0 (H) 05/30/2023    EGFR 55.1 (L) 01/22/2024    CREAT 1.1 (H) 01/22/2024    WBC 4.92 01/22/2024    LYMPHOABS 2.5 08/08/2023    IGG 984 08/10/2023    IGA 477 (H) 05/30/2023    IGM 52 05/30/2023    B12 1,550 (H) 01/22/2024    VITD 55 01/22/2024       ASSESSMENT / PLAN  RTC: Return in about 2 months (around 06/22/2024) for [place].    1. 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 Vitamin B12 deficiency    4. Vitamin D  deficiency    5. Neurologic gait dysfunction    6. 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 A negative CSF GAD65 definitively excludes Stiff Person Syndrome  She takes MYP 3 tabs bid but is having infections and headaches. We will reduce the dose to 2 tab bid    PLAN  - reduce MYP 4 tabs bid. Advised to take w/ food to improve tolerability  - cont vit b12 1000mcg daily  - cont vit d3 50k/week  - increase metaxolone 800mg  qid for pain (currently 400mg  qhs)  - gait & balance PT pending  - no longer on diazepam , opiates; reduce tramadol .         Orders today (details below)  Orders Placed This Encounter   Procedures    Referral to Physical Therapy-[facility] HOD     Orders Placed This Encounter   Medications    Mycophenolate  Sodium (Mycophenolic Acid ) 360 MG Tablet Delayed Response     Sig: Take 720 mg by mouth 2 (two) times daily     Dispense:  360 tablet     Refill:  3     Autoimmune myelitis; provider is rems certified. Labs being monitored.       I spent 40  minutes reviewing the records, talking with the patient and developing their care plan. This excludes time for separately billed procedures.      Sammi Lockwood, MD PHD  Director, Neuroimmunology & MS Center  [facility]  [facility]  [address], [place], [place] [address]    Telephone [phone]  Fax [phone]   The fastest response is via [place]  ===================================================================  NFL (neurofilament light chain) Insurance Justification  Serum / plasma Neurofilament light chain is a well-established biomarker for monitoring MS, and regular testing has been incorporated into the Consortium of MS Centers [facility]) guidelines for the monitoring and clinical decision making for MS.     1)CMSC guidelines PDF: https://mscare.http://www.[place].com/     2) [place] MS, Zora GORMAN Britain RA, Ritch[place], [place], Kuhle J, Lycke J, Olsson T; Consortium of Multiple Sclerosis Centers. Guidance for use of neurofilament light chain as a cerebrospinal fluid and blood biomarker in multiple sclerosis management. EBioMedicine. 2024 Mar;101:104970. doi: 10.1016/j.ebiom.7975.895029. Epub 2024 Feb 13. PMID: 61645467; PMCID: [id number].    PATIENT INSTRUCTIONS PROVIDED  Patient was given an After Visit Summary with a copy of the testing orders, medications and the following other instructions:  Patient Instructions   [facility]  MULTIPLE SCLEROSIS & NEUROIMMUNOLOGY CENTER  Telephone: [phone]  Fax: [phone]  Send us  a mychart or inbasket message for the fastest response  ~~~~~~~~~~~~~~~~~~~~~~~~~~~~~~~~~~~~~~~~~~~~~~~~~~~~~~~~~~~~~~~~~~    WELCOME TO THE NEUROIMMUNOLOGY CLINIC!    MULTIPLE SCLEROSIS AND NEUROIMMUNE DISEASE UPDATE  (Updated January 02, 2024)     1) Clinic Procedures  We only do in-person visits   For over 2000 years, medicine has been a face to face,  personal enterprise -- and the best medicine happens face to face. Although telemedicine is very convenient (and was necessary during the pandemic), I've seen poor outcomes with telemedicine care. In-person evaluations are critical part of you doing well. As such we do not offer video visits. Anything minor that does not require a visit can be handled over the MyChart patient portal.     Regular, ongoing, follow up visits is also important. Each person is different, but we see the average patient every 3-6 months. We may not refill controlled substances if you haven't been seen within 3-6 months, and we may not refill other medications if you haven't been seen within 12 months.  Let us  know if your insurance changes. If you lose insurance and live in [place], [place] offers [facility]. [facility] [place] [facility] [place] & [facility] offer similar for patients living in other areas). See [email or web address]    Reaching us   - Mychart is best. We will respond within 2-3 business days. (We do not use regular email as policy)  - My nurses' direct phone [phone]. The main neurology line is [phone]. Leave a message and we will get back to you in a few days.  - After hours, the main line [phone]) will page the on-call neurologist.    You will probably hear back from my nurses. I communicate with them throughout the day. The advice they give you is coming from me - please take it seriously.    There is a 2 week turnaround time for any forms.     Emergency / After Hours Care.   If you have questions after hours, call the main number [phone]) and you will reach the on-call neurologist who can help you.    If you have severe, acute or debilitating symptoms, go to the emergency room. But also let us  know via mychart or a phone call. I can best help you if you are at an [facility] hospital. You might not have a choice where the ambulance takes you; if you are  not at an [facility] hospital, I'll try to help you the best I can. But I probably can't see everything that's going on.    Urgent appointments are available within a few days. My nurses have access to more appointment slots than the call center does.     Testing. Details matter.  The tests I order are specific and high-quality MS care requires advanced technology    - MRI's: we prefer to work with [facility] radiology or [facility]. Some of our patients are enrolled in studies at [facility], and that works as well. If you have had outside MRI's done previously, please bring the CD's so that we can upload them.    - LABS: Go to the lab indicated on the top of your lab order. Do not take an [facility] lab slip to [facility]- they will do it incorrectly. Do not go to [facility] or your primary care doctor. Most patients are sent to [facility] lab.    - Results of testing will be communicated once they all return at follow-up visits. Many of the tests are nuanced and must be interpreted within the overall clinical picture. I do not call for results.      2) Testing Phone Numbers  MRI & CT:  [facility] at [phone]  [facility] Fax: [phone]  [facility] at [phone]  For insurance approval, reach out to them first. If there is a denial, let us  know ASAP as we can help.     LP / Spinal Tap: Call [phone] for Specialized Imaging Scheduling. Backup is [facility] at [phone]    Labs: Any  location. [email or web address]    3) Doing well with Neuroimmune Disease  Symptoms don't tell you how the disease is doing.  You can experience more symptoms even if the disease isn't worsening. Fluctuating symptoms are expected. Environmental factors like infections, extremes of heat or cold, stress (emotional or physiologic), fatigue, infections, other medical diseases, and certain foods may make you feel more symptoms. That's not the disease worsening, it's not more neurologic damage, and it's not a  relapse. Those are worsening symptoms.  Most neuroimmune patients have good days & bad days. You might need symptomatic medications to help you function well.    Also, some diseases worsen without producing more symptoms.     In general, if you have a question about your symptoms or notice any new symptoms, please reach out.      Monitoring the Disease and Medication Response:  MS and neuroimmune diseases are caused by your body's immune system attacking the nervous system (brain, spinal cord, nerves or muscles). We use immune treatments to prevent more damage. We can measure whether those treatments are working through objective testing - labs, MRI's, EMGs, etc.    For MS, NMO or MOG, we know that everyone develop more lesions, neurologic problems on exam and symptoms unless they are treated (Dr Oneil Alas in [place] proved this). MRI is one of the best ways of detecting more damage - it shows up as a new lesion. The clinical exam and labs can also help us . In MS and MOG, a relapse without symptoms is very common, so we do frequent labs and yearly MRIs to monitor the disease. This is very important to detect subclinical progression of disease.     For other neuroimmune diseases -- eg,  myasthenia, CIDP/GBS, or rheumatologic diseases -- not everyone requires treatment. But we use labs, exam, MRI's and/or EMG to monitor the diseases    Medications are the start, but they aren't everything. They won't repair damage that already happened, and they may not help symptoms. It is to prevent more damage.     Physical and Cognitive Activity Promote Neurologic Recovery: In order to improve the disease, the brain has to rewire around the damaged nerves. The best way to do this is by being active - physically and mentally. Physical, occupational or cognitive therapy can teach you exercises to address specific problems. Physical activity has been shown to improve energy, mood, and reduce pain. Be as physically and mentally  active as you can; learn to pace yourself.    Diet: There is no such thing as an MS diet or anti-inflammatory diet. Foods don't affect MRI lesions or prevent neurologic damage, but a good or bad diet can markedly impact symptoms. My suggestion is to eat healthy - we all know what that is! Diets like the Wahl's protocol or Swank diet have ideas worth trying, but don't feel obligated to follow them precisely. See if adding fiber, reducing gluten, reducing dairy or eliminating processed foods makes a difference in how you feel.    Smoking makes MS worse - it causes more lesions & bigger lesions.     Cannabis worsens cognitive symptoms of MS (Brain fog).    Vitamins and Supplements  Vitamins can affect neurologic functioning:  - Vitaimn D and MS. High vit D levels correlate with better outcomes in MS patients. Target a total (25-OH) Vit D level between 50-100. Vitamin D3 (CHOLEcalciferol ; white pill) works better than Vitamin D2 (ERGOcalciferol ; green pill). You can get high doses of vitamin D3 from amazon (eg, 10,000 or 50,000 units).     - Target vit B12 level above 500. Levels below 400 can cause nerve & spinal cord degeneration. Levels above the normal range are perfectly safe.    - Vitamin B6 (pyridoxine ) and zinc  toxicity cause neuropathy. Do not take B-complex or zinc . Other sources include multi-vitamins, some energy drinks, and other supplements. Check the back of the bottle, not just the front. Candance Cary CD. Pyridoxine  toxicity courtesy of your local  health food store Ann Rheum Dis. 2006 Dec;65(12):1666-7]    - marked copper  deficiency also causes nerve & spinal cord degeneration    - the data around other supplements like biotin, alpha-lipoic acid, etc is unclear, so I generally don't recommend them    Support Groups  NMSS [place] (via Zoom) Elveria Sever. Her cell is [phone].  MS Alliance of [place] / [place] / Donneta GLENWOOD Elvie Cherilyn [phone]   NMSS [place] Kim  [place]. [phone]. [email or web address]  NMSS [place]  / online Speak MS To Me. Odella Geralynn Blush. [phone]. [email or web address]. Monday 2 PM    National MS Society [facility]): [email or web address]  Myasthenia Gravis Foundation of [place] (MGFA): MoralGame.si  [employer] for NMO: https://www.sumairafoundation.org/  [employer] for NMO: https://guthyjacksonfoundation.org/  Foundation for Sarcoid Research  DiscFull.com.cy    3) Neurologic Disease, Immune Suppressants and Infections  - MS, NMO, MOG, and GBS/CIDP themselves do not increase your risk of infection (including COVID). Severe myasthenia might increase your risk for lung infections    - Call us  if you develop a fever more than 101 F, have a green-yellow cough longer than 1 week, or if you have recurring or difficult-to-treat infections. Immune suppressants can increase infection risk. In this situation, you will probably require antibiotics (and stronger and longer than normal).    - None of the MS medications increase your COVID risk above that of an untreated patient (COVIMS study)    - Infections can cause more symptoms. That's usually not disease worsening or neurologic damage from infection. That is symptom worsening, similar to what happens with stress or the heat. You will get better a few days after the infection improves.    - Rarely, COVID can cause neurologic complications. Early on, with severe COVID, we saw a lot of strokes and seizures. We see less of that now. But, we are seeing the emergence of a post-COVID neurologic syndrome that looks like Guillain-Barre syndrome or transverse myelitis. The way to test for this is with a spinal tap (lumbar puncture). Neuro-COVID is treatable.    4) Vaccines  Safe Vaccines, regardless of immunosuppressant  - Pfizer, Moderna, Novavax and Booster COVID vaccines are safe in MS and with all  immunosuppressants. (There were issues with J&J and Astra-Zeneca).    - Flu vaccine. Use Flublok brand only (recombinant hemagluttin quadrivalent). This is per FDA guidance for all neurologic patients. It can be difficult to find and we can give you an RX for it.     - Shingles vaccine. Use Shingrix (RZV, recombinant zoster) vaccine. This is per FDA guidance for all neurologic patients.     - All pneumonia vaccines are safe (Prevnar & Capvaxive )    - Tetanus toxoid and TDAP are ok (TDAP is preferred over DTAP).     Unsafe Vaccines (for any neuroimmune patients)  - Live virus or inactivated virus vaccines are contraindicated in neuro-immunologic patients (Including measles vaccine - this is contraindicated)    - monkeypox vaccines (JYNNEOS and ACAM2000) are contraindicated in MS and patients on immunosupressants.     - RSV vaccines are associated with cases of Guillian Barre syndrome, so I would avoid them.     - J&J and Astra Zeneca COVID vaccine were associated with transverse myelitis    5) Other Notes:  - Mavenclad: The contraindication and timing requirements do not apply to most vaccines. They are only for live virus vaccines, which are contraindicated anwyays    - You don't  need to time your vaccinations with infusion dates. There have been theoretical concerns about immunosuppression affecting vaccine effectiveness, but recent research has shown this not to be true.    - MS is compatible with pregnancy but we need to know if you plan on getting pregnant or become pregnant as we may need to adjust your treatment. Certain disease modifying treatments for MS are better than others for those seeking to become pregnant. Please also let me know when you find out you're pregnant.     - I do not prescribe long term opioids for nerve pain. Prescription opioids lead to worse MS symptoms, with studies showing significantly worse fatigue scores and worse pain scores compared to non-opioid users. Neurological pain is  best managed by evidence-based non-opiate neuropathic pain medications. Complicated chronic pain management or non-neurological pain management will be referred to pain clinic.     - I do not recommend the use of marijuana or ingestion of THC /CBD containing products. Studies show marijuana worsen cognitive scores in MS and contribute to worsened brain fog and fatigue. Studies do not support the use of medical marijuana for spasticity or pain given a lack of objective efficacy to relieving spasticity. Topical CBD creams and ointments available over hte counter are safer as very little gets into your bloodstream. Instead, we use a comprehensive evidence-based approach in managing spasticity and pain that is individualized to the patient.      6) DMT Patient Assistance Phone Numbers  If you run into insurance / supply issues with a branded DMT, you may be able to get a temporary supply from the manufacturer. Also call us  & your infusion center.     Briumvi ([employer]). [phone]. Fax [phone]    Soliris & Ultomiris (Alexion One Source). [phone]. Fax [phone]. [email or web address] Horner (Horizon By Your Side) [phone]. Fax [phone]    Enspryng [facility] Access Solution) [phone]. Fax [phone]. Text [phone]    Zilbrysq ([facility], Onward). [phone]. Fax [phone].     Kesimpta (Novartis Alongside). [phone]. Fax [phone]    Mavenclad (EMD Serono MS Lifelines). [phone]. Fax [phone]    Ocrevus & Ocrevus [facility] [employer]). [phone]. Fax [phone]. Text [phone]    Zeposia (Bristol Billy [employer]b One Source) [phone]; Fax [phone]    Vyvgart & Vyvgart Hytrulo (Argenx MyPath) [phone]. Fax [phone]    Infusion Centers  [facility]  [address] [place], [place] [address]  Phone: [phone]    Bartlett Bartlett Fax [phone]    [place]  Marval Sammi Lockwood, MD  PHD  Director, Neuroimmunology & MS Center  [facility]  [address], [place], [place] [address]    Telephone [phone]  Fax [phone]   The fastest response is via [place]      IMPORTED INFORMATION FROM MEDICAL RECORDS   Diagnosis ICD-10-CM Associated Order   1. 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 Vitamin B12 deficiency  E53.8 Referral to Physical Therapy-[facility] HOD      4. Vitamin D  deficiency  E55.9 Referral to Physical Therapy-[facility] HOD      5. Neurologic gait dysfunction  R26.9 Referral to Physical [facility] HOD  6. Single seizure (CMS/HCC)  R56.9       7. Immunosuppression due to drug therapy  D84.821     Z79.899         Orders Placed This Encounter   Procedures    Referral to Physical [facility] HOD     Standing Status:   Future     Expected Date:   04/23/2024     Expiration Date:   04/22/2025     Referral Priority:   Routine     Referral Type:   Consultation     Referral Reason:   Services Requested     Requested Specialty:   Physical Therapy     Number of Visits Requested:   1

## 2024-04-23 NOTE — Progress Notes (Signed)
 Palliative Telehealth Follow Up    Subjective  [name] is a 68 y.o. female without current concerns. She had just visited with neurology prior to visit and explains everything went well. There was confusion on medication frequency but was straightened out. Reports compliance otherwise. OP PT was ordered. She has transportation. Endorses BLE discomfort that is chronic 4/10. She reports to sleeping well, denies cp, sob, cough, change in GI/GU, edema, bleeding, and recent falls (cane). Will have occasional palpitations, unchanged and asx. Has not had to take prn Torsemide. Feels that her depression/ anxiety are well controlled with medications.     Patient Active Problem List   Diagnosis   . Atrial fibrillation   . Pain, chronic   . HTN (hypertension)   . Epilepsy   . Debility   . At risk for falling   . Dysphagia   . COPD (chronic obstructive pulmonary disease)   . Congestive heart failure (CHF)     Current Outpatient Medications on File Prior to Visit   Medication Sig Dispense Refill   . albuterol  HFA 90 mcg/act inhaler Inhale 2 puffs every 4 hours.     . albuterol  HFA 90 mcg/act inhaler Inhale 2 puffs every 6 hours if needed.     . amLODIPine  (Norvasc ) 2.5 MG tablet Take 1 tablet by mouth 1 time each day.     . budesonide -formoterol  (Symbicort ) 80-4.5 MCG/ACT inhaler Inhale 2 puffs 1 time each day.     . cloNIDine (Catapres) 0.1 MG tablet Take 0.1 mg by mouth in the morning and 0.1 mg in the evening.     . ezetimibe (Zetia) 10 MG tablet Take 1 tablet by mouth 1 time each day.     . metaxalone  (Skelaxin ) 800 MG tablet Take 2,400 mg by mouth in the morning and 2,400 mg before bedtime. Next week taper down to 2 tablets bid .     . mycophenolate  (Myfortic ) 360 MG EC tablet Take 720 mg by mouth in the morning and 720 mg in the evening.     . QUEtiapine  (SEROquel ) 25 MG tablet Take 25 mg by mouth daily as needed (anxiety).     . ranolazine  (Ranexa ) 1000 MG 12 hr tablet Take 1,000 mg by mouth daily as needed (chest  pain).     . rizatriptan (Maxalt) 10 MG tablet Take 10 mg by mouth 1 time if needed for migraine.     . apixaban  (Eliquis ) 5 MG tablet Take by mouth.     . ARIPiprazole (Abilify) 30 MG tablet Take 30 mg by mouth 1 time each day.     . dapagliflozin (Farxiga) 10 MG Take 10 mg by mouth 1 time each day.     . diclofenac sodium 1 % gel Apply topically.     . hydrOXYzine  HCl (Atarax ) 50 MG tablet Take 50 mg by mouth every 6 hours if needed for anxiety.     . hyoscyamine (Anaspaz,Levsin) 0.125 MG tablet Take by mouth.     . lamoTRIgine  (LaMICtal ) 100 MG tablet Take 100 mg by mouth 1 time each day.     . lurasidone  (Latuda ) 120 MG tablet Take 120 mg by mouth in the evening.     . metoprolol  succinate XL (Toprol -XL) 25 MG 24 hr tablet Take 25 mg by mouth 1 time each day.     . pantoprazole  (ProtoNix ) 40 MG EC tablet Take 40 mg by mouth.     . potassium chloride  CR (Klor-Con ) 10 MEQ ER tablet Take  10 mEq by mouth.     . promethazine  (Phenergan ) 25 MG tablet Take by mouth.     . QUEtiapine  XR (SEROquel  XR) 50 MG 24 hr tablet Take 50 mg by mouth 1 time each day at the same time.     . rOPINIRole  (Requip ) 0.5 MG tablet Take by mouth.     . torsemide (Demadex) 20 MG tablet Take by mouth.     . traMADol  (Ultram ) 50 MG tablet Take 50 mg by mouth.     . [DISCONTINUED] diazePAM  (Valium ) 5 MG tablet Take by mouth.     . [DISCONTINUED] gabapentin  (Neurontin ) 100 MG capsule Take by mouth.     . [DISCONTINUED] Mycophenolate  Sodium (Mycophenolic Acid ) 360 MG tablet delayed-release Take 360 mg by mouth 1 time each day.     . [DISCONTINUED] QUEtiapine  (SEROquel ) 100 MG tablet Take 100 mg by mouth at bedtime.       No current facility-administered medications on file prior to visit.     Allergies   Allergen Reactions   . Codeine Itching   . Iodinated Contrast Media    . Latex Itching   . Macrobid [Nitrofurantoin] Itching   . Magnesium -Containing Compounds Itching   . Other      Nitro - itch  Singular   . Statins      rhabdomyalisis   .  Penicillins Rash   . Tetracycline Rash     Social Drivers of Health     Tobacco Use: Low Risk  (04/22/2024)    Received [facility]    Patient History    . Smoking Tobacco Use: Never    . Smokeless Tobacco Use: Never    . Passive Exposure: Not on file   Alcohol Use: Not At Risk (04/22/2024)    Received from [facility]    AUDIT-C    . Frequency of Alcohol Consumption: Never    . Average Number of Drinks: Patient does not drink    . Frequency of Binge Drinking: Never   Financial Resource Strain: Low Risk  (07/28/2023)    Received from [facility]    Overall Financial Resource Strain (CARDIA)    . Difficulty of Paying Living Expenses: Not hard at all   Food Insecurity: No Food Insecurity (07/28/2023)    Received from [facility]    Hunger Vital Sign    . Worried About Programme researcher, broadcasting/film/video in the Last Year: Never true    . Ran Out of Food in the Last Year: Never true   Transportation Needs: No Transportation Needs (07/28/2023)    Received from [facility] - Transportation    . Lack of Transportation (Medical): No    . Lack of Transportation (Non-Medical): No   Physical Activity: Insufficiently Active (07/28/2023)    Received from [facility]    Exercise Vital Sign    . Days of Exercise per Week: 2 days    . Minutes of Exercise per Session: 10 min   Stress: Stress Concern Present (07/28/2023)    Received from [facility] - Occupational Stress Questionnaire    . Feeling of Stress : To some extent   Social Connections: Moderately Integrated (07/28/2023)    Received from [facility]    Social Connection and Isolation Panel [NHANES]    . Frequency of Communication with Friends and Family: More than three times a week    .  Frequency of Social Gatherings with Friends and Family: Once a week    . Attends Religious Services: More than 4 times per year    . Active Member of Clubs or Organizations: Yes    .  Attends Banker Meetings: More than 4 times per year    . Marital Status: Divorced   Catering manager Violence: Not At Risk (07/28/2023)    Received from [facility]    Humiliation, Afraid, Rape, and Kick questionnaire    . Fear of Current or Ex-Partner: No    . Emotionally Abused: No    . Physically Abused: No    . Sexually Abused: No   Depression: Not at risk (04/23/2024)    PHQ-2    . PHQ-2 Score: 0   Recent Concern: Depression - At risk (04/17/2024)    PHQ-2    . PHQ-2 Score: 3   Housing Stability: Low Risk  (07/28/2023)    Received from [facility] Stability Vital Sign    . Unable to Pay for Housing in the Last Year: No    . Number of Times Moved in the Last Year: 0    . Homeless in the Last Year: No   Utilities: Not At Risk (07/28/2023)    Received from [facility]ities    . Threatened with loss of utilities: No   Health Literacy: Adequate Health Literacy (07/28/2023)    Received from [facility] Literacy    . Frequency of need for help with medical instructions: Never     Review of Systems   Constitutional:  Negative for activity change, appetite change and fatigue.   HENT:  Negative for congestion.    Respiratory:  Negative for cough, choking, chest tightness and shortness of breath.    Cardiovascular:  Positive for palpitations. Negative for chest pain and leg swelling.   Gastrointestinal:  Negative for abdominal pain, constipation and diarrhea.   Genitourinary:  Negative for dysuria.   Musculoskeletal:  Positive for back pain and gait problem.   Skin: Negative.    Neurological:  Positive for weakness. Negative for dizziness.        Slight Left sided weakness from previous TIA.   Psychiatric/Behavioral:  The patient is not nervous/anxious.      [place] Symptom Assessment System  Pain Score (If > or = 3, complete the Pain Assessment): 4 (BLE)  Tiredness Score: Not tired  Nausea Score: Not nauseated  Depression Score: Not depressed  Anxiety Score: Not  anxious  Drowsiness Score: Not drowsy  Appetite Score: Best appetite  Wellbeing Score: Best feeling of wellbeing  Dyspnea Score: No shortness of breath  Other Problem Score: (Patient-Rptd) (P) Best possible response     Patient Health Questionnaire-9 Score: 0    Objective  Blood pressure 128/80, height 1.651 m (5' 5), weight 189 lb (85.7 kg).  Oxygen Therapy: None (Room air)    Physical Exam  Pulmonary:      Effort: Pulmonary effort is normal.   Neurological:      Mental Status: She is alert and oriented to person, place, and time.   Psychiatric:         Mood and Affect: Mood normal.         Behavior: Behavior normal.     PPS Score: 70%    Morse Fall Risk Score: 15  ADL Screening  Dressing: Independent  Feeding: Independent  Bathing: Independent  Toileting:  Independent  In/Out Bed: Independent  Walks in Home: Independent    Advance Care Planning  Full code with detailed d/w her daughter Rueben.     ACP Discussion  What is your understanding now of where you are with your illness?: appropriate  How much information about what is likely to be ahead with your illness would you like from me?: wants to be fully informed  Shared Understanding: uncertain  Patient Emotions: acceptance  What are your most important goals if your health situation worsens?: being comfortable, living as long as possible, being independent  Comments: Daughter is health care advocate. Patient prefers all life prolonging interventions, reduced symptoms burden, to maintain as much independence as possible.    GOC conversation initiated, POST form reviewed. Patient would like to discuss with daughter.  06/28/2023 Confirmed existing GOC and discussed ACP completion  10/3- Patient wants all medical interventions if needed. Her daughter is aware.   What are your biggest fears and worries about the future with your health?: physical suffering  What gives you strength as you think about the future of your illness?: family, friends or community  What  abilities are so critical to your life that you can't imagine living without them? 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 OP PT ordered today at visit. Stable without fall and weakness.   -complete PT, continue exercises once [place]   -assistive device with ambulation  -c/w current regimen   -rest when needed   -call carelon with any concerns or questions     Palliative Care Follow-up Expected on   Palliative Care Follow-up Expected on 05/24/2024  Palliative Care Follow-up Expected on 05/24/2024     Marguerite Seta, NP      Telehealth Encounter Details  Type of connection: Audio communication only  Confirmed private setting and not recording with patient: Yes  Patient verbally consents to this visit: Yes  Location of patient [place], [address]): [place]  Location of provider [place], State): [place], [place]  Provider licensed in[place]   Additional people present and roles: Daughter Geographical information systems officer

## 2024-05-10 ENCOUNTER — Telehealth: Payer: Self-pay | Admitting: Neurology

## 2024-05-10 NOTE — Telephone Encounter (Signed)
 Called pt (answered) to reschedule appt to 8/22 at 9:40am due to provider attending conference. Pt OK with date/time.

## 2024-05-27 ENCOUNTER — Telehealth: Payer: Self-pay | Admitting: Neurology

## 2024-05-27 NOTE — Telephone Encounter (Signed)
 Patient called to report she has been having some nerve pain and asking if medication could be called in, she states this was discussed with Dr Deatrice at her last visit.  Unable to reach triage nurse Kevin's line while patient on the line. Patient can be reached at [phone]. Thank you

## 2024-05-28 ENCOUNTER — Other Ambulatory Visit: Payer: Self-pay | Admitting: Neurology

## 2024-05-28 MED ORDER — PREGABALIN 100 MG PO CAPS
100.0000 mg | ORAL_CAPSULE | Freq: Every evening | ORAL | 3 refills | Status: DC
Start: 2024-05-28 — End: 2024-06-14

## 2024-05-28 NOTE — Progress Notes (Signed)
 Pregablin 100mg  qhs started for nerve pain

## 2024-05-28 NOTE — Progress Notes (Signed)
 Subjective  [name] is a 68 y.o. female who presents for Palliative Care Follow-up.    HPI  10 yoa female with complex medical history  to include  Pafib on Eliquis , CHF (EF 43% ), HTN, COPD, and diagnosed with Autoimmune Myelopathy with underlying debility with progression in symptoms. Patient states that she feels like she is at her baseline but also states that she feels her leg pain and weakness is getting worse. Her pain level upon visit is 9/10. She states she took a dose of Tramadol  this am.   She also states that she was evaluated at [facility] ED on 05/23/24 for Migraine HA, post nasal gtt, sinus pressure and cough. She was dx with acute pansinusits and placed on LVQ in which she is on day 7 of 10 dosing.  Patient denies, and fever, chills,or rigors.However, upon initial visit, patient was clammy and cool. States she was having a hot flash. Also stated that she felt mild lightheaded and dizzy. BP noted 136/85 , HR 83 RR 17, Sat 98%.. low temporal temp  92.2 then 93.3.  Symptoms were transient and resolved within 3-5 min. Temp rechecked prior to conclusion of visit 97.1    She denies sob, Denies palpitations. She states that she had transient episode of feeling lightheaded and dizzy with flushing prior to this visit which was noted upon arrival. States she has hot flashes daily and was placed on clonidine by her PCP to help relieve symptoms.    She admits to sleeping well at noc    States appetite is fair. She gets full easily and take in small portions as often as she can    States she has been losing weight. Was told she pre diabetic and had diabetes education. Last Hgb1c 6.1%    Her GOC is to get her pain level to at least 5/10 and to be as independent as possible. She is anticipating beginning PT program in October    Review of Systems   Constitutional:  Positive for fatigue.   HENT: Negative.     Eyes: Negative.    Cardiovascular:  Positive for leg swelling.   Gastrointestinal: Negative.    Endocrine:  Negative.    Genitourinary: Negative.    Musculoskeletal:  Positive for arthralgias and myalgias.   Skin: Negative.    Allergic/Immunologic: Negative.    Neurological:  Positive for dizziness and light-headedness.   Hematological: Negative.    Psychiatric/Behavioral: Negative.       [place] Symptom Assessment System  Pain Score (If > or = 3, complete the Pain Assessment): 9  Tiredness Score: 7  Nausea Score: 2  Depression Score: (Patient-Rptd) (P) Not depressed  Anxiety Score: (Patient-Rptd) (P) Not anxious  Drowsiness Score: (Patient-Rptd) (P) 2  Appetite Score: 6  Wellbeing Score: (Patient-Rptd) (P) Best feeling of wellbeing  Dyspnea Score: (Patient-Rptd) (P) 4  Other Problem Score: Best possible response    Patient Active Problem List    Diagnosis Date Noted   . Encounter for palliative care 05/28/2024   . Myelopathy 04/23/2024   . Atrial fibrillation 09/12/2023   . Pain, chronic 09/12/2023   . HTN (hypertension) 09/12/2023   . Epilepsy 09/12/2023   . Debility 09/12/2023   . At risk for falling 09/12/2023   . Dysphagia 09/12/2023   . COPD (chronic obstructive pulmonary disease) 09/12/2023   . Congestive heart failure (CHF) 09/12/2023     Past Surgical History:   Procedure Laterality Date   . CT ANGIOGRAM CHEST  09/27/2018    CT ANGIOGRAM CHEST 09/27/2018   . CT ANGIOGRAM CHEST  02/05/2017    CT ANGIOGRAM CHEST 02/05/2017   . CT ANGIOGRAM CHEST  01/16/2017    CT ANGIOGRAM CHEST 01/16/2017   . CT ANGIOGRAM CHEST  12/23/2013    CT ANGIOGRAM CHEST 12/23/2013   . MR ANGIOGRAM HEAD WO IV CONTRAST  06/16/2019    MR ANGIOGRAM HEAD WO IV CONTRAST 06/16/2019   . MR ANGIOGRAM HEAD WO IV CONTRAST  12/01/2015    MR ANGIOGRAM HEAD WO IV CONTRAST 12/01/2015   . MR ANGIOGRAM HEAD WO IV CONTRAST  05/28/2014    MR ANGIOGRAM HEAD WO IV CONTRAST 05/28/2014   . MR ANGIOGRAM NECK W AND WO IV CONTRAST  06/16/2019    MR ANGIOGRAM NECK W AND WO IV CONTRAST 06/16/2019     Current Outpatient Medications   Medication Instructions   . albuterol  HFA 90 mcg/act  inhaler 2 puffs, Every 4 hours RT   . albuterol  HFA 90 mcg/act inhaler 2 puffs, Every 6 hours PRN   . amLODIPine  (Norvasc ) 2.5 MG tablet 1 tablet, Daily   . apixaban  (Eliquis ) 5 MG tablet Oral   . budesonide -formoterol  (Symbicort ) 80-4.5 MCG/ACT inhaler 2 puffs, Daily RT   . cloNIDine (CATAPRES) 0.1 mg, 2 times daily   . dapagliflozin (FARXIGA) 10 mg, Oral, Daily RT   . diclofenac sodium 1 % gel Topical   . ezetimibe (Zetia) 10 MG tablet 1 tablet, Daily   . hyoscyamine (Anaspaz,Levsin) 0.125 MG tablet Oral   . lamoTRIgine  (LAMICTAL ) 100 mg, Oral, Daily   . lurasidone  (LATUDA ) 120 mg, Oral, Every evening   . metaxalone  (SKELAXIN ) 2,400 mg, 2 times daily   . metoprolol  succinate XL (TOPROL -XL) 25 mg, Oral, Daily RT   . mycophenolate  (MYFORTIC ) 720 mg, 2 times daily   . pantoprazole  (PROTONIX ) 40 mg, Oral   . promethazine  (Phenergan ) 25 MG tablet Oral   . QUEtiapine  (SEROQUEL ) 25 mg, Daily PRN   . QUEtiapine  XR (SEROQUEL  XR) 50 mg, Oral, Every 24 hours   . ranolazine  (RANEXA ) 1,000 mg, Daily PRN   . rizatriptan (MAXALT) 10 mg, Once as needed   . rOPINIRole  (Requip ) 0.5 MG tablet Oral   . torsemide (Demadex) 20 MG tablet Oral   . traMADol  (ULTRAM ) 50 mg, Oral     Allergies   Allergen Reactions   . Statins      rhabdomyalisis   . Iodinated Contrast Media    . Codeine Itching   . Latex Itching   . Macrobid [Nitrofurantoin] Itching   . Magnesium -Containing Compounds Itching   . Other      Nitro - itch  Singular   . Penicillins Rash   . Tetracycline Rash        SDOH  No Psychosocial Concerns       Patient Health Questionnaire-9 Score: 4    Objective  Blood pressure 136/85, pulse 83, temperature 36.2 C (97.1 F), temperature source Temporal, height 1.651 m (5' 5), weight 190 lb (86.2 kg), SpO2 98%.  Oxygen Therapy: None (Room air)  Physical Exam  Constitutional:       Comments: Upon initial visit, patient was clammy and cool. States she was having a hot flash. Also stated that she felt mild ly lightheaded and dizzy. BP  noted 136/85 , HR 83 RR 17, Sat 98%.. low temporal temp  92.2 then 93.3.  Symptoms were transient and resolved within 3-5 min. Temp rechecked prior to conclusion of visit 97.1  HENT:      Head: Normocephalic and atraumatic.      Nose: Nose normal.      Mouth/Throat:      Mouth: Mucous membranes are moist.   Eyes:      Extraocular Movements: Extraocular movements intact.      Conjunctiva/sclera: Conjunctivae normal.      Pupils: Pupils are equal, round, and reactive to light.   Cardiovascular:      Rate and Rhythm: Normal rate and regular rhythm.      Pulses: Normal pulses.      Heart sounds: Normal heart sounds.      Comments: Has hx Pafib  Pulmonary:      Effort: Pulmonary effort is normal.      Breath sounds: Normal breath sounds.   Abdominal:      General: Bowel sounds are normal.      Palpations: Abdomen is soft.   Genitourinary:     Comments: Voids without difficulty  Musculoskeletal:      Cervical back: Neck supple.      Right lower leg: Edema present.      Left lower leg: Edema present.   Skin:     General: Skin is warm and dry.      Capillary Refill: Capillary refill takes 2 to 3 seconds.      Comments: Initially cool and clammy, after resolution of sx with 3-5 now warm and dry   Neurological:      Mental Status: She is alert and oriented to person, place, and time.   Psychiatric:         Mood and Affect: Mood normal.         Thought Content: Thought content normal.         Judgment: Judgment normal.         PPS Score: 70%  Morse Fall Risk Score: 15  ADL Screening  Dressing: Independent  Feeding: Independent  Bathing: Independent  Toileting: Independent  In/Out Bed: Independent  Walks in Home: Independent    Advance Care Planning  Advance Directive on file?: <no information>  Advance Directive Reviewed? Yes  ACP Discussion  What is your understanding now of where you are with your illness?: appropriate  How much information about what is likely to be ahead with your illness would you like from me?: wants to  be fully informed  Shared Understanding: uncertain  Patient Emotions: acceptance  What are your most important goals if your health situation worsens?: being comfortable, living as long as possible, being independent  Comments: Daughter is health care advocate. Patient prefers all life prolonging interventions, reduced symptoms burden, to maintain as much independence as possible.    GOC conversation initiated, POST form reviewed. Patient would like to discuss with daughter.  06/28/2023 Confirmed existing GOC and discussed ACP completion  10/3- Patient wants all medical interventions if needed. Her daughter is aware.   What are your biggest fears and worries about the future with your health?: physical suffering  What gives you strength as you think about the future of your illness?: family, friends or community  What abilities are so critical to your life that you can't imagine living without them? Or, what makes life meaningful?: being conscious of surroundings, communicating with others, being able to care for oneself, living independently  If you become sicker, how much are you willing to go through for the possibility of gaining more time?: being on a ventilator, being in the ICU, enduring physical discomfort  How  much does your family know about your priorities and wishes?: patient has had extensive discussions with family            Assessment/Plan  1. 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   Neurology Dr. Gerarda Kays; Notes:  2. Encounter for palliative care  Other orders  -     Follow Up In Palliative Medicine; Future      Palliative Care Follow-up Expected on 06/28/2024     SMALL SANDERS, SHAWN RENEA, NP

## 2024-06-08 NOTE — ED Provider Notes (Signed)
 [facility] EMERGENCY [facility]PT     Time of Arrival:   06/08/24 1541         Final diagnoses:   [R20.0] Left arm numbness (Primary)   [R20.0] Numbness of face   [R29.810] Facial droop   [Z86.79] History of atrial fibrillation         Medical Decision Making:      Differential/Questionable Diagnosis:   CVA, TIA, ICH, complicated migraine, Todd's paralysis, seizure disorder, electrolyte abnormality, ACS, atrial fibrillation,                               Glasgow Coma Scale Score: 15                   ED Course/Consults: ASA held due to Eliquis  therapy       Stroke alert was not called as the patient is has mild, non-disabling symptoms, symptoms are >18 hours in duration and, therefore, the risks of an acute intervention (thrombolytics and/or thrombectomy) outweigh any potential benefits.                   Documentation/Prior Results Review : Old medical records:  Note from 12/03/23 noted L facial droop had resolved at time of discharge, Previous electrocardiograms, Nursing notes, Previous radiology studies     Image interpretations by me -  : Not Applicable    CT CTA HEAD, NECK   Final Result   IMPRESSION:   No intracranial high-grade stenosis or large vessel occlusion.      No carotid arterial high-grade stenosis or large vessel occlusion.      Electronically signed by: Donnice Lesches, MD  Board Certified Radiologist 06/08/2024 7:00 PM EDT      EKG 12-LEAD   Final Result          Disposition:  Admit    .    New Prescriptions    No medications on file     Chief Complaint   Patient presents with   . CHEST PAIN (ADULT)   . FACIAL NUMBNESS     Left numbness, chest pain  68 year old female with a history of atrial fibrillation, seizures, CVA, COPD presents with left facial numbness since 9:30 PM last night.  The symptoms have been constant.  She states she was up during the night with the symptoms.  They progressed such that she now has similar sensation in her left arm.  She also feels weakness in  her left face.  After the patient noticed the numbness in her face she also developed left-sided chest pain which has been intermittent since its onset.        Review of Systems:  Constitutional:  Negative for fever.   HENT:  Negative for congestion.    Eyes:  Negative for visual disturbance.   Respiratory:  Negative for cough.    Cardiovascular:  Negative for chest pain.   Gastrointestinal:  Negative for abdominal pain.   Genitourinary:  Negative for dysuria.   Musculoskeletal:  Negative for back pain.   Skin:  Negative for rash.   Neurological:  Positive for numbness.        Physical Exam  Vitals and nursing note reviewed.   Constitutional:       General: She is not in acute distress.     Appearance: She is well-developed. She is not diaphoretic.   HENT:      Head: Normocephalic and atraumatic.  Nose: Nose normal.      Mouth/Throat:      Mouth: Mucous membranes are moist.      Pharynx: Uvula midline.   Eyes:      General: No scleral icterus.     Extraocular Movements: Extraocular movements intact.      Conjunctiva/sclera: Conjunctivae normal.      Pupils: Pupils are equal, round, and reactive to light.   Cardiovascular:      Rate and Rhythm: Normal rate and regular rhythm.      Heart sounds: Normal heart sounds. No murmur heard.     No friction rub. No gallop.   Pulmonary:      Effort: Pulmonary effort is normal. No respiratory distress.      Breath sounds: Normal breath sounds. No stridor. No wheezing or rales.   Abdominal:      General: There is no distension.      Palpations: Abdomen is soft.      Tenderness: There is no abdominal tenderness. There is no guarding or rebound.   Musculoskeletal:      Right lower leg: No edema.      Left lower leg: No edema.   Skin:     General: Skin is warm and dry.      Findings: No rash.      Nails: There is no clubbing.   Neurological:      Mental Status: She is alert and oriented to person, place, and time.      Cranial Nerves: Facial asymmetry present. No dysarthria.       Sensory: Sensory deficit (pinprick L face and arm) present.      Motor: Motor function is intact.      Gait: Gait is intact.         Past Medical History:   Diagnosis Date   . Acute, but ill-defined, cerebrovascular disease     TIA   . Arthropathy     knees   . Asthma    . Bipolar disorder (HCC)    . Cardiac arrhythmia     h/o Afib on Eliquis    . Cerebral artery occlusion with cerebral infarction [facility])     CVA post endodartorectomy   . Chest pain     has history multiple admissions for this, stress test and cardiac cath negative   . Chronic anemia     Hgb ~10   . Chronic obstructive pulmonary disease (HCC) 01/19/2017   . COPD (chronic obstructive pulmonary disease) (HCC)    . Cough    . COVID    . COVID-19 vaccine series completed    . CVA (cerebral vascular accident) (HCC) 09/01/2021   . Deficiency anemia 11/2020    rx with Iron Infusions   . Epilepsy (HCC)     per pt last Sz was approx 9-10 months ago   . Esophageal reflux    . Headache    . 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 Hemorrhoids    . History of blood transfusion     following one of her pregnancy   . History of cardiac catheterization no obstructive cad in 2017    . Hyperlipidemia    . Hyperlipidemia    . Hypertension    . Lower leg edema    . Migraines    . Paroxysmal atrial fibrillation (HCC)     on anticoagulation   . Seizure-like activity (HCC)     EEG negative   .  Stable angina pectoris 02/15/2018   . Stiff person syndrome    . Syncope     recurrent, has loop recorder   . Thoracic aortic aneurysm    . TIA (transient ischemic attack)     multiple since age 55's per patient. Multiple MRI brain in the past negative for evidence of stroke. Suspected complicated migraine.   . Type 2 diabetes mellitus (HCC)     not on medication   . Unable to coordinate sucking, swallowing, and breathing 09/28/2019     Past Surgical History:   Procedure Laterality Date   . BREAST BIOPSY Right  06/18/2021    right ultrasound core biopsy with clip placed   . CARDIAC CATH  2016, 02/2016   . CAROTID ENDARTERECTOMY     . COLONOSCOPY N/A 07/01/2013    Procedure: COLONOSCOPY;  Surgeon: Neysa Katz, MD;  Location: [facility] ENDO OR LOC;  Service: Endoscopy GI;  Laterality: N/A;   . COLONOSCOPY N/A 04/13/2015    Procedure: COLONOSCOPY FLEXIBLE;  Surgeon: Glendia Sherry, MD;  Location: [facility] ENDO OR LOC;  Service: Endoscopy GI;  Laterality: N/A;   . COLONOSCOPY WITH BIOPSY N/A 04/13/2015    Procedure: COLONOSCOPY FLEXIBLE WITH BIOPSY;  Surgeon: Glendia Sherry, MD;  Location[facility] ENDO OR LOC;  Service: Endoscopy GI;  Laterality: N/A;   . COLONOSCOPY WITH BIOPSY N/A 01/31/2017    Procedure: COLONOSCOPY FLEXIBLE WITH BIOPSY;  Surgeon: Vicci Emery DEL, MD   . COLONOSCOPY WITH BIOPSY N/A 05/29/2017    Procedure: COLONOSCOPY, WITH BIOPSY;  Surgeon: Josepha Marinell BROCKS, MD   . COLONOSCOPY WITH BIOPSY N/A 05/29/2017    Procedure: COLONOSCOPY, WITH BIOPSY;  Surgeon: Josepha Marinell BROCKS, MD   . EGD N/A 07/01/2013    Procedure: ESOPHAGOGASTRODUODENOSCOPY;  Surgeon: Neysa Katz, MD;  Location: [facility] ENDO OR LOC;  Service: Endoscopy GI;  Laterality: N/A;   . EGD N/A 08/08/2014    Procedure: ESOPHAGOGASTRODUODENOSCOPY FLEXIBLE TRANSORAL DIAGNOSTIC;  Surgeon: Glendia Copier, MD;  Location: [facility] ENDO OR LOC;  Service: Endoscopy GI;  Laterality: N/A;   . EGD WITH BIOPSY N/A 06/07/2016    Procedure: ESOPHAGOGASTRODUODENOSCOPY FLEXIBLE TRANSORAL WITH BIOPSY;  Surgeon: Vicci Emery DEL, MD   . EGD WITH BIOPSY N/A 03/20/2020    Procedure: EGD, WITH BIOPSY;  Surgeon: Josepha Marinell BROCKS, MD   . EGD WITH BLEEDING CONTROL N/A 05/29/2017    Procedure: EGD, WITH HEMORRHAGE CONTROL;  Surgeon: Josepha Marinell BROCKS, MD   . EXCISION OF BENIGN LESION Right 10/19/2021    Procedure: EXCISION, LESION, BENIGN, BREAST;  Surgeon: Clance Rosealee HERO, MD   . HEART CATHETERIZATION     . HYSTERECTOMY     . LOOP ELECTROSURGICAL EXCISON PROCEDURE     . LOOP RECORDER   2018   . OTHER Right 06/18/2021    axillary lymph node biopsy with clip placed   . OTHER  05/2021    Left leg ablation procedure   . VENOUS ACCESS CATHETER INSERTION Right 03/29/2019     Family History   Problem Relation Age of Onset   . Hypertension Mother    . Diabetes Mother    . Hypertension Sister    . Diabetes Brother    . Hypertension Brother    . Other Family History Father         Unknown to patient   . Anesthesia Reaction Neg Hx      Social History     Occupational History   . Not on file   Tobacco Use   .  Smoking status: Never     Passive exposure: Never   . Smokeless tobacco: Never   Vaping Use   . Vaping status: Never Used   Substance and Sexual Activity   . Alcohol use: No     Alcohol/week: 0.0 standard drinks of alcohol   . Drug use: No   . Sexual activity: Not Currently     No outpatient medications have been marked as taking for the 06/08/24 encounter [facility] Encounter).     Allergies   Allergen Reactions   . Montelukast rash/itching and swelling   . Nitroglycerin hives and rash/itching   . Immune Globulin  (Human) (Igg) swelling and rash/itching     Pt complain of itchiness and swelling of lips and tongue. Saturation fine, bp elevated.   . Magnesium  Sulfate rash/itching     Can take magnesium  supplement   . Amoxicillin hives     Has tolerated ceftriaxone, cephalexin   . Aspirin  rash/itching     Patient states she was told to not take aspirin  d/t daily eliquis  use. Patient also states she itches all over with medication   . Atorvastatin  unknown and other/intolerance     Rhabdomyolysis   Pt states she thinks it caused her to have rhabdo   . Bumex [Bumetanide] muscle/joint pain   . Ceftriaxone rash/itching   . Magnesium  Chloride rash/itching   . Sulfamethoxazole-Trimethoprim rash/itching   . Penicillins hives     Has tolerated cephalexin   . Tetracyclines hives   . Iodinated Contrast Media hives     Updated per Dr. Lorn who confirmed with pt.    . Latex, Natural Rubber rash/itching and hives   . Kit  Prep Of Tc-15m-Tetrofosmin unknown       Vital Signs:  Patient Vitals for the past 72 hrs:   Temp Heart Rate Pulse Resp BP BP Mean SpO2 Weight   06/08/24 1900 -- 65 65 13 129/63 80 MM HG 99 % --   06/08/24 1845 -- 74 72 15 133/72 88 MM HG 100 % --   06/08/24 1830 -- 71 71 17 132/64 86 MM HG 100 % --   06/08/24 1815 -- 65 64 15 -- -- 100 % --   06/08/24 1800 -- 62 64 13 128/96 (!) 108 MM HG 96 % --   06/08/24 1745 -- 74 74 16 155/76 99 MM HG 99 % --   06/08/24 1730 -- 67 67 19 164/73 97 MM HG 96 % --   06/08/24 1715 -- 75 75 17 143/77 97 MM HG 100 % --   06/08/24 1700 -- 71 66 22 117/68 84 MM HG 97 % --   06/08/24 1645 -- 67 70 21 122/66 82 MM HG 100 % --   06/08/24 1631 -- -- -- -- -- -- -- 86.3 kg (190 lb 4.1 oz)   06/08/24 1630 -- 70 72 16 123/63 80 MM HG 99 % --   06/08/24 1615 -- 69 73 17 123/61 78 MM HG (!) 76 % --   06/08/24 1600 -- 76 73 16 122/64 81 MM HG 100 % --   06/08/24 1543 97.8 F (36.6 C) 91 -- 17 145/66 92 MM HG 95 % --       Normal oxygenation    Diagnostics:  Labs:    Results for orders placed or performed during the hospital encounter of 06/08/24   Chem8, i-STAT (Lab)   Result Value Ref Range    POTASSIUM 3.5 3.5 - 5.5 mmol/L  CHLORIDE 106 98 - 110 mmol/L    CALCIUM  IONIZED 4.9 4.4 - 5.4 mg/dL    CO2 75.9 79.9 - 67.9 mmol/L    Glucose 99 70 - 99 mg/dL    BUN 4 (L) 6 - 22 mg/dL    CREATININE 0.9 0.8 - 1.4 mg/dL    SODIUM 857 866 - 854 mmol/L    HGB 10.9 (L) 11.7 - 16.1 g/dL    HCT 67.9 (L) 64.8 - 48.3 %    POC-eGFR 70.5 >60.0    Cartridge type CHEM8+    Basic Metabolic Panel   Result Value Ref Range    Potassium 3.7 3.5 - 5.5 mmol/L    Sodium 143 133 - 145 mmol/L    Chloride 107 98 - 110 mmol/L    Glucose 99 70 - 99 mg/dL    Calcium  9.0 8.4 - 10.5 mg/dL    BUN 6 6 - 22 mg/dL    Creatinine 0.8 0.8 - 1.4 mg/dL    CO2 25 20 - 32 mmol/L    eGFR 75.5 >60.0 mL/min/1.73 sq.m.    Anion Gap 11.0 3.0 - 15.0 mmol/L   aPTT   Result Value Ref Range    APTT 27 22 - 36 sec   Protime w/INR   Result Value Ref  Range    Protime 11.1 9.0 - 13.0 sec    INR 1.02 0.93 - 1.29   Troponin   Result Value Ref Range    Troponin (T) Quant High Sensitivity (5th Gen) <6 0 - 19 ng/L   Magnesium    Result Value Ref Range    Magnesium  1.9 1.6 - 2.5 mg/dL   CBC WITH DIFFERENTIAL AUTO   Result Value Ref Range    WBC 6.4 4.0 - 11.0 K/uL    RBC 3.87 3.80 - 5.20 M/uL    HGB 9.9 (L) 11.7 - 16.1 g/dL    HCT 68.0 (L) 64.8 - 48.3 %    MCV 82 80 - 99 fL    MCH 26 26 - 34 pg    MCHC 31 31 - 36 g/dL    RDW 85.0 89.9 - 84.4 %    Platelet 308 140 - 440 K/uL    MPV 9.5 9.0 - 13.0 fL    Segmented Neutrophils (Auto) 57 40 - 75 %    Lymphocytes (Auto) 34 20 - 45 %    Monocytes (Auto) 7 3 - 12 %    Eosinophils (Auto) 2 0 - 6 %    Basophils (Auto) 1 0 - 2 %    Absolute Neutrophils (Auto) 3.6 1.8 - 7.7 K/uL    Absolute Lymphocytes (Auto) 2.2 1.0 - 4.8 K/uL    Absolute Monocytes (Auto) 0.4 0.1 - 1.0 K/uL    Absolute Eosinophils (Auto) 0.1 0.0 - 0.5 K/uL    Absolute Basophils (Auto) 0.0 0.0 - 0.2 K/uL       Medications given in the ED  Medications   methylPREDNISolone  SOD SUCC (PF) (SOLU-Medrol ) injection 40 mg (40 mg IV Push End IVPB 06/08/24 1715)   diphenhydrAMINE  (BenadryL ) injection 50 mg (50 mg IV Push Given 06/08/24 1711)   ketorolac  (ToradoL ) injection 15 mg (15 mg IV Push Given 06/08/24 1726)   iohexol  (OmniPaque ) 350 mg iodine /mL solution 80 mL (80 mL Intravenous Given 06/08/24 1833)   acetaminophen  (TylenoL ) tablet 650 mg (650 mg Oral Given 06/08/24 1844)       Documentation Notes:  Parts of this note were generated by the speech  recognition system and may contain inherent errors or omissions not intended by the user. Grammatical errors, random word insertions, deletions, pronoun errors and incomplete sentences are occasional consequences of this technology due to software limitations. Not all errors are caught or corrected.  My documentation is often completed after the patient is no longer under my clinical care. In some cases, the Epic EMR may pull  updated results into the above documentation which may not reflect all results or information that were available to me at the time of my medical decision making.

## 2024-06-09 NOTE — Discharge Summary (Signed)
 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 female with past medical history of nonobstructive CAD, atrial fibrillation [on Eliquis ], heart failure with preserved ejection fraction, COPD epilepsy, autoimmune myelitis [dorsal column, myelopathy], does not have stiff person syndrome per Neuro,Bipolar disorder, history of CVA post endarterectomy, type 2 diabetes comes in with left-sided facial and upper extremity paresthesias and numbness.  Patient has had previous admissions for similar symptoms.  She was admitted for stroke workup.  CT head and MRI of the brain was negative for any acute stroke.  She was seen by our neurologist who recommended outpatient follow-up with her primary neurologist as this was related to her myelitis.  She was discharged home in stable state today.         Chief Complaint/History of Present Illness:  Date of admission: 06/08/2024  Chief complaints: Left-sided facial and upper extremity numbness and paresthesias.  HPI:( per admitting  provider)  [name] is an 68 y.o. female who came from Home with past medical history of nonobstructive CAD, atrial fibrillation [on Eliquis ] heart failure with preserved ejection fraction COPD epilepsy, autoimmune myelitis [dorsal column, myelopathy] does not have stiff person syndrome per Neuro,Bipolar disorder history of CVA post endarterectomy, type 2 diabetes who came to the ED due to left-sided facial and upper extremity numbness and paresthesias and is being admitted for TIA/stroke workup.  Of note patient has previously been admitted to our facility for the same symptoms.  She said that she started feeling normal on her face and her left arm couple of hours before presentation and it persisted.  Right side within normal limits.  No chest pain no shortness of breath no loss of consciousness no seizures no changes in bowel or urinary habits denies tobacco alcohol or illicit drug use.  In the ED, CBC showed hemoglobin of 9.9.  Chemistry did not show any abnormalities.  CTA head and neck did not show any abnormalities EKG showed sinus rhythm.  Patient admitted for MRI and further stroke workup.  NIHSS score 1       Procedures:  None      Consultants:  Lobbyist Provider: [employer]  Consulting Provider: Molli Lesches, MD      Disposition: Home        Diagnostic Studies:  Recent Results (from the past 2 weeks)   1. MRI Head without Contrast    Narrative    HISTORY:  Stroke, follow up    EXAM: MR HEAD/BRAIN WITHOUT IV CONTRAST    COMPARISONS:  CTA head and neck dated June 08, 2024    TECHNIQUE:  Magnetic resonance imaging of the head/brain without the  administration of contrast. Standardized images were acquired in  multiple planes with multiple pulse sequences.    FINDINGS:  EXTRACRANIAL:  No extracranial soft tissue swelling or fluid collection is  appreciated. The orbits are symmetric.    BONES/SINUSES:  Bones are grossly intact with no significant  abnormality.  Mucoperiosteal thickening of the paranasal sinuses is noted.  Visualized mastoid air cells are well aerated.    INTRACRANIAL:  No altered signal is seen on the isotopic diffusion-weighted images  (DWI) or apparent diffusion coefficient (ADC) maps to suggest abnormal  restricted diffusion.    Ventricles are normal in size. 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 Normal flow voids are appreciated within the larger blood  vessels.    No cerebral edema or mass. Mild white matter T2/FLAIR  hyperintensity(s) are present. These can be seen in symptomatic and  asymptomatic patients, but most commonly related to chronic  microangiopathy. Less common etiologies include migraines,  demyelinating disease, and vasculitis. No accelerated cerebral  atrophy.    No abnormal parenchymal gradient/susceptibility signal to suggest  blood products/hemorrhage or calcification.      Impression    IMPRESSION:  No acute infarction or intracranial hemorrhage.    Mild amount of chronic small vessel ischemic changes.    Electronically signed by: Paticia Pottier, MD  Board Certified  Radiologist 06/09/2024 1:47 PM   2. CT CTA HEAD, NECK    Narrative    HISTORY:    Neuro deficit, acute, stroke suspected    EXAM:  CT CTA HEAD, NECK    COMPARISON:  Head CT, 03/27/2024.    TECHNIQUE:  CT of the head/brain without intravenous contrast. Multiplanar reconstructed images were created and reviewed. CT angiography of the neck/carotid arteries and head/brain (Circle of Willis) with the administration of iodinated contrast intravenously. 3D MIP reconstructions were created and reviewed. Multiplanar reconstructed images were created and reviewed. DICOM format image data is available electronically for review and comparison. Computer-assisted triage and analysis was performed by VIZ AI LVO ContaCT which enables rapid LVO detection and notification of pertinent providers. NASCET CRITERIA USED TO ESTIMATE PERCENT OF STENOSIS (Stenosis % = (1 -  [narrowest ICA diameter/diameter of distal ICA]) X 100.     HEAD FINDINGS:  Noncontrast: No acute intracranial hemorrhage, midline shift or mass effect.     ACAs:  No large vessel occlusion (LVO) or significant stenosis anteriorly.    RIGHT MCA:  No large vessel occlusion (LVO) or significant stenosis.  LEFT MCA:  No large vessel occlusion (LVO) or significant stenosis.    RIGHT PCA:  No large vessel occlusion (LVO) or significant stenosis.  LEFT PCA:  No large vessel occlusion (LVO) or significant stenosis.  RIGHT ICA:  Visualized petrous, cavernous, and supraclinoid segments appear grossly patent.   LEFT ICA:  Visualized petrous, cavernous, and supraclinoid segments appear grossly patent.     BASILAR Artery:  No large vessel occlusion (LVO) or significant stenosis.    OTHER INTRACRANIAL:  No intracranial aneurysm or significant vascular malformation.    OTHER:  No other significant findings.    NECK/CAROTID FINDINGS:  RIGHT CCA:  No significant stenosis or occlusion.  RIGHT ICA:  No significant stenosis or occlusion.   RIGHT ICA stenosis (%):  0 %  RIGHT ECA:  No significant stenosis or occlusion.    LEFT CCA:  No significant stenosis or occlusion.  LEFT ICA:  No significant stenosis or occlusion.   LEFT ICA stenosis (%):  0 %  LEFT ECA:  No significant stenosis or occlusion.    RIGHT VERTEBRAL:  Codominant with no large vessel occlusion (LVO) or dissection.  LEFT VERTEBRAL:  Codominant with no large vessel occlusion (LVO) or dissection.    NON ARTERIAL:  Soft tissues are negative. Visualized upper chest is negative.    OTHER:  No other significant findings.      Impression    IMPRESSION:  No intracranial high-grade stenosis or large vessel occlusion.    No carotid arterial high-grade stenosis or large vessel occlusion.    Electronically signed by: Donnice Lesches, MD  Board Certified Radiologist 06/08/2024 7:00 PM EDT   3. EKG 12-LEAD   Result Value Ref Range    Heart Rate 80 bpm    RR Interval 752 ms    Atrial  Rate 80 ms    P-R Interval 160 ms    P Duration 125 ms    P Horizontal Axis -2 deg    P Front Axis 36 deg    Q Onset 500 ms    QRSD Interval 89 ms    QT Interval 365 ms    QTcB 421 ms    QTcF 402 ms    QRS Horizontal Axis -3 deg    QRS Axis 20 deg    I-40 Front Axis 23 deg    t-40 Horizontal Axis -10 deg    T-40 Front Axis 22 deg    T Horizontal Axis 189 deg    T Wave Axis 208 deg    S-T Horizontal Axis 180 deg    S-T Front Axis 216 deg    Impression - ABNORMAL ECG -     Impression SR-Sinus rhythm-normal P axis, V-rate 50-99     Impression       REPDI-Nonspecific  repol abnormality, diffuse leads-ST dep, T flat/neg,   ant/lat/inf      Impression       -No significant change since prior tracing, 04/09/2024-              Dear [name], MD   [name] is advised to follow up with you within 1-2 weeks.       Scheduled appointments:  Follow Up Info (next 45 days)       Follow up with Pouch Waynard, MD in 1 week    Specialty: Family Practice    Phone: [phone]    Where: [facility]      Monday Jun 10, 2024    My Health Care Coordinator Outreach Visit with [name] Aubry HERO, RN at 10:45 AM    Where: [facility] ([facility])      Friday Jun 14, 2024    PCP Telehealth  Visit Long with [name] Mina, MD at  1:00 PM    Where: [facility] and Internal Medicine Physicians  [facility] )      Monday Jul 01, 2024    Prediabetes Follow Up with Interpreter with [facility] DIAB [name] at 10:30 AM    Where: [facility])      [facility]  [address]  [place] [address]  [phone] [facility] and Internal Medicine Physicians   [facility]   [address]  [place] [address]  [phone] [facility]  [facility]  [address]  [name]  [address]  [phone]                 Discharge Medication List        CONTINUE taking these medications      Ammonium Lactate 12 % Crea  Use 1 Application to affected area Daily as needed (feet).  Refills: 0      apixaban  5 mg Tabs  Take 1 Tab by Mouth Twice Daily.  Refills: 0  Commonly known as: Eliquis       cloNIDine HCL 0.1 mg Tabs  TAKE 1/2 TABLET BY MOUTH TWICE A DAY  Dispense: 90 Tab  Refills: 1  Commonly known as: Catapres      dapagliflozin propanediol 10 mg Tabs  Take 1 Tab by Mouth Once a Day.  Dispense: 30 Tab  Refills: 0  Commonly known as: Farxiga      ezetimibe 10 mg Tabs  Take 1 Tab by Mouth Once a Day.  Refills: 0  Commonly known as: Zetia      lamoTRIgine  100 mg Tabs  Take 1 Tab by Mouth Once a Day. Indications: epilepsy  Dispense: 30 Tab  Refills: 0  Commonly known as: LaMICtal       lurasidone  40 mg Tabs  Take 4 Tabs by Mouth Every Night at Bedtime.  Refills: 0  Commonly known as: Latuda       MAGNESIUM  GLYCINATE PO  Take 100 mg by Mouth Once a Day. Indications: patient states she takes this over the counter  Refills: 0      metaxalone  800 mg Tabs  Take 1 Tab by Mouth Daily At Noon.  Refills: 0  Commonly known as: Skelaxin       metoprolol  XL 25 mg Tb24  Take 0.5 Tabs by Mouth Once a Day.  Refills: 0  Commonly known as: Toprol  XL      Mycophenolate  Sodium 360 mg Tbec  Take 2 Tabs by Mouth Twice Daily.  Refills: 0      pantoprazole  40 mg Tbec  TAKE 1 TABLET BY MOUTH TWICE A DAY  Dispense: 180 Tab  Refills: 1  Commonly known as: Protonix       * QUEtiapine  25 mg Tabs  Take 1 Tab by Mouth Daily as needed.  Refills: 0  Commonly known as: SEROquel       * QUEtiapine  25 mg Tabs  Take 2 Tabs by Mouth Every Night at Bedtime.  Refills: 0  Commonly known as: SEROquel       rOPINIRole  0.5 mg Tabs  Take 1 Tab by Mouth Every Night at Bedtime.  Refills: 0  Commonly known as: Requip       topiramate  25 mg Tabs  Take 2 Tabs by Mouth Twice Daily.  Refills: 0  Commonly known as: Topamax   torsemide 20 mg Tabs  Take  1 Tab by Mouth Once a Day.  Dispense: 30 Tab  Refills: 0  Commonly known as: Demadex      traMADoL  50 mg Tabs  Take 1 Tab by Mouth 2 Times Daily As Needed. for pain  Dispense: 60 Tab  Refills: 0  Commonly known as: Ultram            * * This list has 2 medication(s) that are the same as other medications prescribed for you. Read the directions carefully, and ask your doctor or other care provider to review them with you.                               Discharge Exam:   BP 126/65   Pulse 86   Temp 98.5 F (36.9 C)   Resp 18   Ht 62 (1.575 m)   Wt 86.2 kg (190 lb)   LMP 10/13/1979   SpO2 97%   BMI 34.75 kg/m     Vitals and nursing note reviewed.   Constitutional:       Appearance: Alert and oriented x3, appearance normal  HENT:      Head: Normocephalic and atraumatic.      Nose: Nose normal.      Mouth/Throat:      Mouth: Mucous membranes are moist.   Eyes:      Extraocular Movements: Extraocular movements intact.      Pupils: Pupils are equal, round, and reactive to light.   Cardiovascular:      Rate and Rhythm: Normal rate and regular rhythm.      Pulses: Normal pulses.      Heart sounds: Normal heart sounds.   Pulmonary:      Effort: Pulmonary effort is normal.      Breath sounds: Normal breath sounds.   Abdominal:      General: Abdomen is flat. Bowel sounds are normal.      Palpations: Abdomen is soft.   Musculoskeletal:         General: Normal range of motion.  No edema cyanosis.        No deformity  Skin:     General: Skin is warm.  No rash        Capillary Refill: Capillary refill takes less than 2 seconds.   Neurological:      General: No focal deficit present.      Mental Status: Alert and Oriented  Psychiatric:         Mood and Affect: Mood normal.   Affect congruent to mood     Behavior: Behavior normal.                       Recent Results (from the past 72 hours)   Basic Metabolic Panel    Collection Time: 06/08/24  5:03 PM   Result Value Ref Range    Potassium 3.7 3.5 - 5.5 mmol/L    Sodium 143  133 - 145 mmol/L    Chloride 107 98 - 110 mmol/L    Glucose 99 70 - 99 mg/dL    Calcium  9.0 8.4 - 10.5 mg/dL    BUN 6 6 - 22 mg/dL    Creatinine 0.8 0.8 - 1.4 mg/dL    CO2 25 20 - 32 mmol/L    eGFR 75.5 >60.0 mL/min/1.73 sq.m.    Anion Gap 11.0 3.0 - 15.0 mmol/L  aPTT    Collection Time: 06/08/24  5:03 PM   Result Value Ref Range    APTT 27 22 - 36 sec   Protime w/INR    Collection Time: 06/08/24  5:03 PM   Result Value Ref Range    Protime 11.1 9.0 - 13.0 sec    INR 1.02 0.93 - 1.29   Troponin    Collection Time: 06/08/24  5:03 PM   Result Value Ref Range    Troponin (T) Quant High Sensitivity (5th Gen) <6 0 - 19 ng/L   Magnesium     Collection Time: 06/08/24  5:03 PM   Result Value Ref Range    Magnesium  1.9 1.6 - 2.5 mg/dL   CBC WITH DIFFERENTIAL AUTO    Collection Time: 06/08/24  5:03 PM   Result Value Ref Range    WBC 6.4 4.0 - 11.0 K/uL    RBC 3.87 3.80 - 5.20 M/uL    HGB 9.9 (L) 11.7 - 16.1 g/dL    HCT 68.0 (L) 64.8 - 48.3 %    MCV 82 80 - 99 fL    MCH 26 26 - 34 pg    MCHC 31 31 - 36 g/dL    RDW 85.0 89.9 - 84.4 %    Platelet 308 140 - 440 K/uL    MPV 9.5 9.0 - 13.0 fL    Segmented Neutrophils (Auto) 57 40 - 75 %    Lymphocytes (Auto) 34 20 - 45 %    Monocytes (Auto) 7 3 - 12 %    Eosinophils (Auto) 2 0 - 6 %    Basophils (Auto) 1 0 - 2 %    Absolute Neutrophils (Auto) 3.6 1.8 - 7.7 K/uL    Absolute Lymphocytes (Auto) 2.2 1.0 - 4.8 K/uL    Absolute Monocytes (Auto) 0.4 0.1 - 1.0 K/uL    Absolute Eosinophils (Auto) 0.1 0.0 - 0.5 K/uL    Absolute Basophils (Auto) 0.0 0.0 - 0.2 K/uL   Chem8, i-STAT (Lab)    Collection Time: 06/08/24  5:14 PM   Result Value Ref Range    POTASSIUM 3.5 3.5 - 5.5 mmol/L    CHLORIDE 106 98 - 110 mmol/L    CALCIUM  IONIZED 4.9 4.4 - 5.4 mg/dL    CO2 75.9 79.9 - 67.9 mmol/L    Glucose 99 70 - 99 mg/dL    BUN 4 (L) 6 - 22 mg/dL    CREATININE 0.9 0.8 - 1.4 mg/dL    SODIUM 857 866 - 854 mmol/L    HGB 10.9 (L) 11.7 - 16.1 g/dL    HCT 67.9 (L) 64.8 - 48.3 %    POC-eGFR 70.5 >60.0     Cartridge type CHEM8+    CBC tomorrow am    Collection Time: 06/09/24  3:45 AM   Result Value Ref Range    WBC 8.0 4.0 - 11.0 K/uL    RBC 3.72 (L) 3.80 - 5.20 M/uL    HGB 9.5 (L) 11.7 - 16.1 g/dL    HCT 69.8 (L) 64.8 - 48.3 %    MCV 81 80 - 99 fL    MCH 26 26 - 34 pg    MCHC 32 31 - 36 g/dL    RDW 84.8 89.9 - 84.4 %    Platelet 288 140 - 440 K/uL    MPV 10.1 9.0 - 13.0 fL   Renal Panel tomorrow am    Collection Time: 06/09/24  3:45 AM   Result Value  Ref Range    Sodium 142 133 - 145 mmol/L    Potassium 4.0 3.5 - 5.5 mmol/L    Chloride 108 98 - 110 mmol/L    Glucose 129 (H) 70 - 99 mg/dL    BUN 10 6 - 22 mg/dL    CO2 21 20 - 32 mmol/L    Creatinine 0.9 0.8 - 1.4 mg/dL    Calcium  8.9 8.4 - 10.5 mg/dL    Albumin  3.6 3.5 - 5.0 g/dL    Phosphorus 3.1 2.5 - 4.5 mg/dL    eGFR 33.6 >39.9 fO/fpw/8.26 sq.m.    Anion Gap 13.0 3.0 - 15.0 mmol/L   GLUCOSE SCREEN    Collection Time: 06/09/24  1:03 PM   Result Value Ref Range    Glucose POC 109 (H) 70 - 99 mg/dL        Patient Instructions     Discharge Procedure Orders   Diet as Follows:    Cardiac Diet         Follow Up Info (next 45 days)       Follow up with [name] Mina, MD in 1 week    Specialty: Family Practice    Phone: [phone]    Where: [facility]      Monday Jun 10, 2024    My Health Care Coordinator Outreach Visit with [name] Aubry HERO, RN at 10:45 AM    Where: [facility] ([facility])      Friday Jun 14, 2024    PCP Telehealth Visit Long with [name] Mina, MD at  1:00 PM    Where: [facility] and Internal Medicine Physicians  [facility] )      Monday Jul 01, 2024    Prediabetes Follow Up with Interpreter with [facility] DIAB [name] at 10:30 AM    Where: [facility] ([facility])      [facility] Coordination  [facility]  [address]  [place] [address]  [phone] [facility] and Internal Medicine Physicians   [facility]   [address]  [place] [address]  [phone] [facility]  [facility]  [address]  [place] [address]  [phone]            Signed   Total time for discharge - review of data, exam, discussion with providers and care-team, med-rec and orders, arranging follow up, counseling of patient and/or family and documentation was 35 minutes       Judith Sor, MD   Hospital Medicine  06/09/2024  4:52 PM          *Some images could not be shown.

## 2024-06-14 ENCOUNTER — Other Ambulatory Visit (FREE_STANDING_LABORATORY_FACILITY)

## 2024-06-14 ENCOUNTER — Ambulatory Visit: Attending: Neurology | Admitting: Neurology

## 2024-06-14 ENCOUNTER — Encounter: Payer: Self-pay | Admitting: Neurology

## 2024-06-14 DIAGNOSIS — G9589 Other specified diseases of spinal cord: Secondary | ICD-10-CM

## 2024-06-14 DIAGNOSIS — E559 Vitamin D deficiency, unspecified: Secondary | ICD-10-CM | POA: Insufficient documentation

## 2024-06-14 DIAGNOSIS — E538 Deficiency of other specified B group vitamins: Secondary | ICD-10-CM

## 2024-06-14 LAB — COMPREHENSIVE METABOLIC PANEL
ALT: <6 U/L (ref <=55)
AST (SGOT): 12 U/L (ref <=41)
Albumin/Globulin Ratio: 0.9 (ref 0.9–2.2)
Albumin: 3.2 g/dL — ABNORMAL LOW (ref 3.5–5.0)
Alkaline Phosphatase: 81 U/L (ref 37–117)
Anion Gap: 7 (ref 5.0–15.0)
BUN: 5 mg/dL — ABNORMAL LOW (ref 7–21)
Bilirubin, Total: 0.3 mg/dL (ref 0.2–1.2)
CO2: 32 meq/L — ABNORMAL HIGH (ref 17–29)
Calcium: 9.2 mg/dL (ref 8.5–10.5)
Chloride: 102 meq/L (ref 99–111)
Creatinine: 0.8 mg/dL (ref 0.4–1.0)
GFR: >60 mL/min/1.73 m2 (ref >=60.0)
Globulin: 3.6 g/dL (ref 2.0–3.6)
Glucose: 92 mg/dL (ref 70–100)
Hemolysis Index: 3 {index}
Potassium: 3.6 meq/L (ref 3.5–5.3)
Protein, Total: 6.8 g/dL (ref 6.0–8.3)
Sodium: 141 meq/L (ref 135–145)

## 2024-06-14 LAB — VITAMIN B12: Vitamin B-12: >2000 pg/mL — ABNORMAL HIGH (ref 211–911)

## 2024-06-14 LAB — VITAMIN D, 25 OH, TOTAL: Vitamin D 25-OH, Total: 33 ng/mL (ref 30–100)

## 2024-06-14 MED ORDER — PREGABALIN 100 MG PO CAPS
100.0000 mg | ORAL_CAPSULE | Freq: Every evening | ORAL | 1 refills | Status: DC
Start: 2024-06-14 — End: 2024-09-16

## 2024-06-14 NOTE — Progress Notes (Signed)
 [facility]  MULTIPLE SCLEROSIS & NEUROIMMUNOLOGY CENTER  Telephone: [phone]  Fax: [phone]  Send us  a mychart or inbasket message for the fastest response  ~~~~~~~~~~~~~~~~~~~~~~~~~~~~~~~~~~~~~~~~~~~~~~~~~~~~~~~~~~~~~~~~~~~~~~~~~~~~~~~~~~~~  Visit Date: 06/14/2024    Referring Neurologist:     CC: autoimmune dorsal column myelopathy    HPI:   History was obtained from records review, patient,     68 y.o. year-old female with leg discomfort    Her pain MD stopped metaxolone and started baclofen.     She c/o muscle spasms and nerve pain in legs. She has not started pregabalin .       EXAM  Visit Vitals  BP 126/78 (BP Site: Left arm, Patient Position: Sitting, Cuff Size: Large)   Pulse 65   Temp 97.9 F (36.6 C) (Oral)   Resp 12   LMP  (LMP Unknown)   SpO2 95%     General:   Optic Nerves:   Psychiatric.   Mental Status: The patient was awake, alert, appropriate  Cranial Nerves: CN II-XII wnl  Motor: 4/5, reduced tone  Reflex:   Sensation:   Coordination:   Gait: came in wheelchair. Mostly uses a cane for 100 feet.       Imaging     Reports:  CT Head WO Contrast  Result Date: 05/23/2024  Impression:  IMPRESSION: 1.  No acute intracranial findings. 2.  Pansinus inflammatory sinus disease with air-fluid levels and frothy secretions. Correlate for signs of acute sinusitis. Electronically signed by: Loman Loges, MD  Board Certified Radiologist 05/23/2024 4:58 PM EDT    CT Head WO Contrast  Result Date: 03/27/2024  Impression:  IMPRESSION: Unremarkable study. Electronically signed by: MARLA Franky Oaks, MD  Board Certified Radiologist 03/27/2024 7:09 PM EDT    MRI Brain WO Contrast  Result Date: 12/03/2023  Impression:  IMPRESSION: 1. No acute intracranial abnormality or significant interval change. 2. Periventricular and predominantly subcortical T2 hyperintensities bilaterally are moderately advanced for age. The finding is nonspecific but can be seen in the setting of chronic microvascular ischemia, a  demyelinating process such as multiple sclerosis, vasculitis, complicated migraine headaches, or as the sequelae of a prior infectious or inflammatory process. Electronically Signed   By: Lonni Necessary M.D.   On: 12/03/2023 10:29    CT Head WO Contrast  Result Date: 12/01/2023  Impression:  IMPRESSION: No acute intracranial abnormality. Electronically Signed   By: Suzen Dials M.D.   On: 12/01/2023 20:45       Testing       Labs  Lab Results   Component Value Date    VITB6 3.6 05/30/2023    HGBA1C 6.0 (H) 05/30/2023    EGFR 55.1 (L) 01/22/2024    CREAT 1.1 (H) 01/22/2024    WBC 4.92 01/22/2024    LYMPHOABS 2.5 08/08/2023    IGG 984 08/10/2023    IGA 477 (H) 05/30/2023    IGM 52 05/30/2023    B12 1,550 (H) 01/22/2024    VITD 55 01/22/2024       ASSESSMENT / PLAN  RTC: Return in about 6 weeks (around 07/26/2024) for [place].    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 Vitamin D  deficiency    3. 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 A negative CSF GAD65 definitively excludes Stiff Person Syndrome. She tolerated  MYP 4 tabs daily better, without any infections since last visit    A pain MD stopped metaxolone and started baclofen. This is an inappropriate decision in the absence of clinical spasticity and will cause her to worsen. In fact, her strength is lower than before which is attributable to baclofen.       PLAN  MYP 4 tabs daily; now well tolerated  cont vit b12 1000mcg daily  cont vit d3 50k/week  Restart metaxolone 800mg  qid  Start pregabalin  100mg  qhs  Discontinue baclofen  Reduce tramadol  (use only in PM)      Orders today (details below)  Orders Placed This Encounter   Procedures    CBC with Differential (Order)    Comprehensive Metabolic Panel    Vitamin D , 25 OH, Total    Vitamin B12     Orders Placed This Encounter   Medications    pregabalin   (LYRICA ) 100 MG capsule     Sig: Take 1 capsule (100 mg) by mouth once every evening     Dispense:  90 capsule     Refill:  1       I spent 40 minutes reviewing the records, talking with the patient and developing their care plan. This excludes time for separately billed procedures.      Sammi Lockwood, MD PHD  Director, Neuroimmunology & MS Center  [facility]  [address], [place], [place] [address]    Telephone [phone]  Fax [phone]   The fastest response is via [place]  ===================================================================  NFL (neurofilament light chain) Insurance Justification  Serum / plasma Neurofilament light chain is a well-established biomarker for monitoring MS, and regular testing has been incorporated into the Consortium of MS Centers [facility]) guidelines for the monitoring and clinical decision making for MS.     1)CMSC guidelines PDF: https://mscare.http://www.stevens.com/     2) [place], Zora GORMAN Britain RA, [place], [place], Kuhle J, Lycke J, Olsson T; Consortium of Multiple Sclerosis Centers. Guidance for use of neurofilament light chain as a cerebrospinal fluid and blood biomarker in multiple sclerosis management. EBioMedicine. 2024 Mar;101:104970. doi: 10.1016/j.ebiom.7975.895029. Epub 2024 Feb 13. PMID: 61645467; PMCID: [id number].    PATIENT INSTRUCTIONS PROVIDED  Patient was given an After Visit Summary with a copy of the testing orders, medications and the following other instructions:  Patient Instructions   [facility]  MULTIPLE SCLEROSIS & NEUROIMMUNOLOGY CENTER  Telephone: [phone]  Fax: [phone]  Send us  a mychart or inbasket message for the fastest response  ~~~~~~~~~~~~~~~~~~~~~~~~~~~~~~~~~~~~~~~~~~~~~~~~~~~~~~~~~~~~~~~~~~    WELCOME TO THE NEUROIMMUNOLOGY CLINIC!    MULTIPLE SCLEROSIS AND NEUROIMMUNE DISEASE  UPDATE  (Updated January 02, 2024)     1) Clinic Procedures  We only do in-person visits   For over 2000 years, medicine has been a face to face, personal enterprise -- and the best medicine happens face to face. Although telemedicine is very convenient (and was necessary during the pandemic), I've seen poor outcomes with telemedicine care. In-person evaluations are critical part of you doing well. As such we do not offer video visits. Anything minor that does not require a visit can be handled over the MyChart patient portal.     Regular, ongoing, follow up visits is also important. Each person is different, but we see the average patient every 3-6 months. We may not refill controlled substances if you haven't been seen within 3-6 months, and we  may not refill other medications if you haven't been seen within 12 months.    Let us  know if your insurance changes. If you lose insurance and live in [place], [place] offers [facility]. [place] [facility] [place] , [place] & [facility] offer similar for patients living in other areas). See [email or web address]    Reaching us   - Mychart is best. We will respond within 2-3 business days. (We do not use regular email as policy)  - My nurses' direct phone [phone]. The main neurology line is [phone]. Leave a message and we will get back to you in a few days.  - After hours, the main line [phone]) will page the on-call neurologist.    You will probably hear back from my nurses. I communicate with them throughout the day. The advice they give you is coming from me - please take it seriously.    There is a 2 week turnaround time for any forms.     Emergency / After Hours Care.   If you have questions after hours, call the main number [phone]) and you will reach the on-call neurologist who can help you.    If you have severe, acute or debilitating symptoms, go to the emergency room. But also let us  know via  mychart or a phone call. I can best help you if you are at an [facility] hospital. You might not have a choice where the ambulance takes you; if you are not at an [facility] hospital, I'll try to help you the best I can. But I probably can't see everything that's going on.    Urgent appointments are available within a few days. My nurses have access to more appointment slots than the call center does.     Testing. Details matter.  The tests I order are specific and high-quality MS care requires advanced technology    - MRI's: we prefer to work with [facility] radiology or [facility]. Some of our patients are enrolled in studies at [facility], and that works as well. If you have had outside MRI's done previously, please bring the CD's so that we can upload them.    - LABS: Go to the lab indicated on the top of your lab order. Do not take an [facility] lab slip to [facility]- they will do it incorrectly. Do not go to [facility] or your primary care doctor. Most patients are sent to [facility] lab.    - Results of testing will be communicated once they all return at follow-up visits. Many of the tests are nuanced and must be interpreted within the overall clinical picture. I do not call for results.      2) Testing Phone Numbers  MRI & CT:  [facility] at [phone]  [facility] Fax: [phone]  [facility] at [phone]  For insurance approval, reach out to them first. If there is a denial, let us  know ASAP as we can help.     LP / Spinal Tap: Call [phone] for Specialized Imaging Scheduling. Backup is [facility] at [phone]    Labs: Any [facility] location. [email or web address]    3) Doing well with Neuroimmune Disease  Symptoms don't tell you how the disease is doing.  You can experience more symptoms even if the disease isn't worsening. Fluctuating symptoms are expected. Environmental factors like infections, extremes of heat or cold, stress (emotional or physiologic), fatigue, infections, other medical  diseases, and certain foods may make you feel more symptoms. That's not the  disease worsening, it's not more neurologic damage, and it's not a relapse. Those are worsening symptoms. Most neuroimmune patients have good days & bad days. You might need symptomatic medications to help you function well.    Also, some diseases worsen without producing more symptoms.     In general, if you have a question about your symptoms or notice any new symptoms, please reach out.      Monitoring the Disease and Medication Response:  MS and neuroimmune diseases are caused by your body's immune system attacking the nervous system (brain, spinal cord, nerves or muscles). We use immune treatments to prevent more damage. We can measure whether those treatments are working through objective testing - labs, MRI's, EMGs, etc.    For MS, NMO or MOG, we know that everyone develop more lesions, neurologic problems on exam and symptoms unless they are treated (Dr Oneil Alas in [place] proved this). MRI is one of the best ways of detecting more damage - it shows up as a new lesion. The clinical exam and labs can also help us . In MS and MOG, a relapse without symptoms is very common, so we do frequent labs and yearly MRIs to monitor the disease. This is very important to detect subclinical progression of disease.     For other neuroimmune diseases -- eg,  myasthenia, CIDP/GBS, or rheumatologic diseases -- not everyone requires treatment. But we use labs, exam, MRI's and/or EMG to monitor the diseases    Medications are the start, but they aren't everything. They won't repair damage that already happened, and they may not help symptoms. It is to prevent more damage.     Physical and Cognitive Activity Promote Neurologic Recovery: In order to improve the disease, the brain has to rewire around the damaged nerves. The best way to do this is by being active - physically and mentally. Physical, occupational or cognitive therapy can teach you  exercises to address specific problems. Physical activity has been shown to improve energy, mood, and reduce pain. Be as physically and mentally active as you can; learn to pace yourself.    Diet: There is no such thing as an MS diet or anti-inflammatory diet. Foods don't affect MRI lesions or prevent neurologic damage, but a good or bad diet can markedly impact symptoms. My suggestion is to eat healthy - we all know what that is! Diets like the Wahl's protocol or Swank diet have ideas worth trying, but don't feel obligated to follow them precisely. See if adding fiber, reducing gluten, reducing dairy or eliminating processed foods makes a difference in how you feel.    Smoking makes MS worse - it causes more lesions & bigger lesions.     Cannabis worsens cognitive symptoms of MS (Brain fog).    Vitamins and Supplements  Vitamins can affect neurologic functioning:  - Vitaimn D and MS. High vit D levels correlate with better outcomes in MS patients. Target a total (25-OH) Vit D level between 50-100. Vitamin D3 (CHOLEcalciferol ; white pill) works better than Vitamin D2 (ERGOcalciferol ; green pill). You can get high doses of vitamin D3 from amazon (eg, 10,000 or 50,000 units).     - Target vit B12 level above 500. Levels below 400 can cause nerve & spinal cord degeneration. Levels above the normal range are perfectly safe.    - Vitamin B6 (pyridoxine ) and zinc  toxicity cause neuropathy. Do not take B-complex or zinc . Other sources include multi-vitamins, some energy drinks, and other supplements. Check the back  of the bottle, not just the front. Candance Cary CD. Pyridoxine  toxicity courtesy of your local health food store Ann Rheum Dis. 2006 Dec;65(12):1666-7]    - marked copper  deficiency also causes nerve & spinal cord degeneration    - the data around other supplements like biotin, alpha-lipoic acid, etc is unclear, so I generally don't recommend them    Support Groups  NMSS [place] (via Zoom) Elveria Sever. Her cell is [phone].  MS Alliance of [place] / [place] / Donneta GLENWOOD Elvie Cherilyn [phone]   NMSS [place] Kim Kiggins. [phone]. [email or web address]  NMSS [place]  / online Speak MS To Me. Odella Geralynn Blush. [phone]. [email or web address]. Monday 2 PM    National MS Society [facility]): [email or web address]  Myasthenia Gravis Foundation of [place] (MGFA): MoralGame.si  [employer] for NMO: https://www.sumairafoundation.org/  [employer] for NMO: https://guthyjacksonfoundation.org/  Foundation for Sarcoid Research  DiscFull.com.cy    3) Neurologic Disease, Immune Suppressants and Infections  - MS, NMO, MOG, and GBS/CIDP themselves do not increase your risk of infection (including COVID). Severe myasthenia might increase your risk for lung infections    - Call us  if you develop a fever more than 101 F, have a green-yellow cough longer than 1 week, or if you have recurring or difficult-to-treat infections. Immune suppressants can increase infection risk. In this situation, you will probably require antibiotics (and stronger and longer than normal).    - None of the MS medications increase your COVID risk above that of an untreated patient (COVIMS study)    - Infections can cause more symptoms. That's usually not disease worsening or neurologic damage from infection. That is symptom worsening, similar to what happens with stress or the heat. You will get better a few days after the infection improves.    - Rarely, COVID can cause neurologic complications. Early on, with severe COVID, we saw a lot of strokes and seizures. We see less of that now. But, we are seeing the emergence of a post-COVID neurologic syndrome that looks like Guillain-Barre syndrome or transverse myelitis. The way to test for this is with a spinal tap (lumbar puncture). Neuro-COVID is  treatable.    4) Vaccines  Safe Vaccines, regardless of immunosuppressant  - Pfizer, Moderna, Novavax and Booster COVID vaccines are safe in MS and with all immunosuppressants. (There were issues with J&J and Astra-Zeneca).    - Flu vaccine. Use Flublok brand only (recombinant hemagluttin quadrivalent). This is per FDA guidance for all neurologic patients. It can be difficult to find and we can give you an RX for it.     - Shingles vaccine. Use Shingrix (RZV, recombinant zoster) vaccine. This is per FDA guidance for all neurologic patients.     - All pneumonia vaccines are safe (Prevnar & Capvaxive )    - Tetanus toxoid and TDAP are ok (TDAP is preferred over DTAP).     Unsafe Vaccines (for any neuroimmune patients)  - Live virus or inactivated virus vaccines are contraindicated in neuro-immunologic patients (Including measles vaccine - this is contraindicated)    - monkeypox vaccines (JYNNEOS and ACAM2000) are contraindicated in MS and patients on immunosupressants.     - RSV vaccines are associated with cases of Guillian Barre syndrome, so I would avoid them.     - J&J and Astra Zeneca COVID vaccine were associated with transverse myelitis    5) Other Notes:  - Mavenclad: The contraindication and timing requirements do not apply to most vaccines. They  are only for live virus vaccines, which are contraindicated anwyays    - You don't need to time your vaccinations with infusion dates. There have been theoretical concerns about immunosuppression affecting vaccine effectiveness, but recent research has shown this not to be true.    - MS is compatible with pregnancy but we need to know if you plan on getting pregnant or become pregnant as we may need to adjust your treatment. Certain disease modifying treatments for MS are better than others for those seeking to become pregnant. Please also let me know when you find out you're pregnant.     - I do not prescribe long term opioids for nerve pain. Prescription opioids  lead to worse MS symptoms, with studies showing significantly worse fatigue scores and worse pain scores compared to non-opioid users. Neurological pain is best managed by evidence-based non-opiate neuropathic pain medications. Complicated chronic pain management or non-neurological pain management will be referred to pain clinic.     - I do not recommend the use of marijuana or ingestion of THC /CBD containing products. Studies show marijuana worsen cognitive scores in MS and contribute to worsened brain fog and fatigue. Studies do not support the use of medical marijuana for spasticity or pain given a lack of objective efficacy to relieving spasticity. Topical CBD creams and ointments available over hte counter are safer as very little gets into your bloodstream. Instead, we use a comprehensive evidence-based approach in managing spasticity and pain that is individualized to the patient.      6) Phone Numbers    Drug Patient Assistance Programs  If you run into insurance / supply issues with a branded DMT, you may be able to get a temporary supply from the manufacturer. Also call us  & your infusion center.     Briumvi ([employer]). [phone]. Fax [phone]    Soliris & Ultomiris (Alexion One Source). [phone]. Fax [phone]. [email or web address]    Uplizna (Horizon By Your Side) [phone]. Fax [phone]    [facility] Access Solution) [phone]. Fax [phone]. Text [phone]    Zilbrysq ([facility], Onward). [phone]. Fax [phone].     Kesimpta (Novartis Alongside). [phone]. Fax [phone]    Mavenclad (EMD Serono MS Lifelines). [phone]. Fax [phone]    Ocrevus & Ocrevus Zunovo [facility] [employer]). [phone]. Fax [phone]. Text [phone]    Zeposia ([employer] One Source) [phone]; Fax [phone]    Vyvgart & Vyvgart Hytrulo (Argenx MyPath) [phone]. Fax [phone]    Infusion Centers  [facility]  [address] [place], [place] [address]  Phone: [phone]    [facility] Fax [phone]    Devonne Perry @ [employer] Phone Number: [phone]. Fax [phone]    [facility]  Address: [address] [place], [place] [address] and other locations  Phone: [phone]    Fax: [phone]    [facility]  Fax: [phone]  Phone: [phone] or [phone]    Physical Therapy  [facility] [phone]   [facility]   [phone]   [facility] [phone]   [facility]   [phone]   [facility]    [phone]     Dr Truman Hopkins has home-PT tips for MS  [email or web address]    Thank you,      Sammi Lockwood, MD PHD  Director, Neuroimmunology & MS Center  [facility]  Institute  [employer] [facility] [employer] [facility]  [address], [place], [place] [address]    Telephone [phone]  Fax [phone]   The fastest response is via [place]      IMPORTED INFORMATION FROM MEDICAL RECORDS   Diagnosis ICD-10-CM Associated Order   1. Autoimmune disorder of spinal cord (CMS/HCC)  G95.89 CBC with Differential (Order)     Comprehensive Metabolic Panel     Vitamin D , 25 OH, Total     Vitamin B12      2. Vitamin D  deficiency  E55.9 Vitamin D , 25 OH, Total      3. Vitamin B12 deficiency  E53.8 Vitamin B12        Orders Placed This Encounter   Procedures    CBC with Differential (Order)     Standing Status:   Future     Expected Date:   06/14/2024     Expiration Date:   09/12/2024     Release to patient:   Immediate    Comprehensive Metabolic Panel     Standing Status:   Future     Expected Date:   06/14/2024     Expiration Date:   09/12/2024     Has the patient fasted?:   No     Release to patient:   Immediate    Vitamin D , 25 OH, Total     Standing Status:   Future     Expected Date:   06/14/2024      Expiration Date:   09/12/2024     Release to patient:   Immediate    Vitamin B12     Standing Status:   Future     Expected Date:   06/14/2024     Expiration Date:   09/12/2024     Release to patient:   Immediate

## 2024-06-14 NOTE — Patient Instructions (Signed)
 [place] [facility]  MULTIPLE SCLEROSIS & NEUROIMMUNOLOGY CENTER  Telephone: [phone]  Fax: [phone]  Send us  a mychart or inbasket message for the fastest response  ~~~~~~~~~~~~~~~~~~~~~~~~~~~~~~~~~~~~~~~~~~~~~~~~~~~~~~~~~~~~~~~~~~    WELCOME TO THE NEUROIMMUNOLOGY CLINIC!    MULTIPLE SCLEROSIS AND NEUROIMMUNE DISEASE UPDATE  (Updated January 02, 2024)     1) Clinic Procedures  We only do in-person visits   For over 2000 years, medicine has been a face to face, personal enterprise -- and the best medicine happens face to face. Although telemedicine is very convenient (and was necessary during the pandemic), I've seen poor outcomes with telemedicine care. In-person evaluations are critical part of you doing well. As such we do not offer video visits. Anything minor that does not require a visit can be handled over the MyChart patient portal.     Regular, ongoing, follow up visits is also important. Each person is different, but we see the average patient every 3-6 months. We may not refill controlled substances if you haven't been seen within 3-6 months, and we may not refill other medications if you haven't been seen within 12 months.    Let us  know if your insurance changes. If you lose insurance and live in [place], [facility] offers [facility]. [facility] [place] [facility] [place] & [facility] offer similar for patients living in other areas). See [email or web address]    Reaching us   - Mychart is best. We will respond within 2-3 business days. (We do not use regular email as policy)  - My nurses' direct phone [phone]. The main neurology line is [phone]. Leave a message and we will get back to you in a few days.  - After hours, the main line [phone]) will page the on-call neurologist.    You will probably hear back from my nurses. I communicate with them throughout the day. The advice they give you is coming from me - please take it  seriously.    There is a 2 week turnaround time for any forms.     Emergency / After Hours Care.   If you have questions after hours, call the main number [phone]) and you will reach the on-call neurologist who can help you.    If you have severe, acute or debilitating symptoms, go to the emergency room. But also let us  know via mychart or a phone call. I can best help you if you are at an [facility] hospital. You might not have a choice where the ambulance takes you; if you are not at an [facility], I'll try to help you the best I can. But I probably can't see everything that's going on.    Urgent appointments are available within a few days. My nurses have access to more appointment slots than the call center does.     Testing. Details matter.  The tests I order are specific and high-quality MS care requires advanced technology    - MRI's: we prefer to work with [facility] radiology or [facility] imaging. Some of our patients are enrolled in studies at [facility], and that works as well. If you have had outside MRI's done previously, please bring the CD's so that we can upload them.    - LABS: Go to the lab indicated on the top of your lab order. Do not take an [facility] lab slip to [facility]- they will do it incorrectly. Do not go to [facility] or your primary care doctor. Most patients are sent to [facility] lab.    -  Results of testing will be communicated once they all return at follow-up visits. Many of the tests are nuanced and must be interpreted within the overall clinical picture. I do not call for results.      2) Testing Phone Numbers  MRI & CT:  [facility] at [phone]  [facility] Fax: [phone]  [facility] at [phone]  For insurance approval, reach out to them first. If there is a denial, let us  know ASAP as we can help.     LP / Spinal Tap: Call [phone] for Specialized Imaging Scheduling. Backup is [facility] at [phone]    Labs: Any [place] location.  [email or web address]    3) Doing well with Neuroimmune Disease  Symptoms don't tell you how the disease is doing.  You can experience more symptoms even if the disease isn't worsening. Fluctuating symptoms are expected. Environmental factors like infections, extremes of heat or cold, stress (emotional or physiologic), fatigue, infections, other medical diseases, and certain foods may make you feel more symptoms. That's not the disease worsening, it's not more neurologic damage, and it's not a relapse. Those are worsening symptoms. Most neuroimmune patients have good days & bad days. You might need symptomatic medications to help you function well.    Also, some diseases worsen without producing more symptoms.     In general, if you have a question about your symptoms or notice any new symptoms, please reach out.      Monitoring the Disease and Medication Response:  MS and neuroimmune diseases are caused by your body's immune system attacking the nervous system (brain, spinal cord, nerves or muscles). We use immune treatments to prevent more damage. We can measure whether those treatments are working through objective testing - labs, MRI's, EMGs, etc.    For MS, NMO or MOG, we know that everyone develop more lesions, neurologic problems on exam and symptoms unless they are treated (Dr Oneil Alas in [place] proved this). MRI is one of the best ways of detecting more damage - it shows up as a new lesion. The clinical exam and labs can also help us . In MS and MOG, a relapse without symptoms is very common, so we do frequent labs and yearly MRIs to monitor the disease. This is very important to detect subclinical progression of disease.     For other neuroimmune diseases -- eg,  myasthenia, CIDP/GBS, or rheumatologic diseases -- not everyone requires treatment. But we use labs, exam, MRI's and/or EMG to monitor the diseases    Medications are the start, but they aren't  everything. They won't repair damage that already happened, and they may not help symptoms. It is to prevent more damage.     Physical and Cognitive Activity Promote Neurologic Recovery: In order to improve the disease, the brain has to rewire around the damaged nerves. The best way to do this is by being active - physically and mentally. Physical, occupational or cognitive therapy can teach you exercises to address specific problems. Physical activity has been shown to improve energy, mood, and reduce pain. Be as physically and mentally active as you can; learn to pace yourself.    Diet: There is no such thing as an MS diet or anti-inflammatory diet. Foods don't affect MRI lesions or prevent neurologic damage, but a good or bad diet can markedly impact symptoms. My suggestion is to eat healthy - we all know what that is! Diets like the Wahl's protocol or Swank diet have ideas worth  trying, but don't feel obligated to follow them precisely. See if adding fiber, reducing gluten, reducing dairy or eliminating processed foods makes a difference in how you feel.    Smoking makes MS worse - it causes more lesions & bigger lesions.     Cannabis worsens cognitive symptoms of MS (Brain fog).    Vitamins and Supplements  Vitamins can affect neurologic functioning:  - Vitaimn D and MS. High vit D levels correlate with better outcomes in MS patients. Target a total (25-OH) Vit D level between 50-100. Vitamin D3 (CHOLEcalciferol; white pill) works better than Vitamin D2 (ERGOcalciferol ; green pill). You can get high doses of vitamin D3 from amazon (eg, 10,000 or 50,000 units).     - Target vit B12 level above 500. Levels below 400 can cause nerve & spinal cord degeneration. Levels above the normal range are perfectly safe.    - Vitamin B6 (pyridoxine) and zinc toxicity cause neuropathy. Do not take B-complex or zinc. Other sources include multi-vitamins, some energy drinks, and other supplements. Check the back of the  bottle, not just the front. Candance Cary CD. Pyridoxine toxicity courtesy of your local health food store Ann Rheum Dis. 2006 Dec;65(12):1666-7]    - marked copper deficiency also causes nerve & spinal cord degeneration    - the data around other supplements like biotin, alpha-lipoic acid, etc is unclear, so I generally don't recommend them    Support Groups  NMSS [place] (via Zoom) Elveria Sever. Her cell is [phone].  MS Alliance of [place] / [place] / Donneta GLENWOOD Elvie Cherilyn [phone]   NMSS [place] Kim Kiggins. [phone]. [email or web address]  NMSS [place]  / online Speak MS To Me. Odella Geralynn Blush. [phone]. [email or web address]. Monday 2 PM    National MS Society [facility]): [email or web address].[email or web address]  Myasthenia Gravis Foundation of [place] (MGFA): MoralGame.si  [employer] for NMO: https://www.sumairafoundation.org/  [employer] for NMO: https://guthyjacksonfoundation.org/  Foundation for Sarcoid Research  DiscFull.com.cy    3) Neurologic Disease, Immune Suppressants and Infections  - MS, NMO, MOG, and GBS/CIDP themselves do not increase your risk of infection (including COVID). Severe myasthenia might increase your risk for lung infections    - Call us  if you develop a fever more than 101 F, have a green-yellow cough longer than 1 week, or if you have recurring or difficult-to-treat infections. Immune suppressants can increase infection risk. In this situation, you will probably require antibiotics (and stronger and longer than normal).    - None of the MS medications increase your COVID risk above that of an untreated patient (COVIMS study)    - Infections can cause more symptoms. That's usually not disease worsening or neurologic damage from infection. That is symptom worsening, similar to what happens with stress or the heat. You will get better a few days after  the infection improves.    - Rarely, COVID can cause neurologic complications. Early on, with severe COVID, we saw a lot of strokes and seizures. We see less of that now. But, we are seeing the emergence of a post-COVID neurologic syndrome that looks like Guillain-Barre syndrome or transverse myelitis. The way to test for this is with a spinal tap (lumbar puncture). Neuro-COVID is treatable.    4) Vaccines  Safe Vaccines, regardless of immunosuppressant  - Pfizer, Moderna, Novavax and Booster COVID vaccines are safe in MS and with all immunosuppressants. (There were issues with J&J and Astra-Zeneca).    - Flu vaccine. Use Flublok brand only (recombinant  hemagluttin quadrivalent). This is per FDA guidance for all neurologic patients. It can be difficult to find and we can give you an RX for it.     - Shingles vaccine. Use Shingrix (RZV, recombinant zoster) vaccine. This is per FDA guidance for all neurologic patients.     - All pneumonia vaccines are safe (Prevnar & Capvaxive )    - Tetanus toxoid and TDAP are ok (TDAP is preferred over DTAP).     Unsafe Vaccines (for any neuroimmune patients)  - Live virus or inactivated virus vaccines are contraindicated in neuro-immunologic patients (Including measles vaccine - this is contraindicated)    - monkeypox vaccines (JYNNEOS and ACAM2000) are contraindicated in MS and patients on immunosupressants.     - RSV vaccines are associated with cases of Guillian Barre syndrome, so I would avoid them.     - J&J and Astra Zeneca COVID vaccine were associated with transverse myelitis    5) Other Notes:  - Mavenclad: The contraindication and timing requirements do not apply to most vaccines. They are only for live virus vaccines, which are contraindicated anwyays    - You don't need to time your vaccinations with infusion dates. There have been theoretical concerns about immunosuppression affecting vaccine effectiveness, but recent research has shown this not to be true.    - MS is  compatible with pregnancy but we need to know if you plan on getting pregnant or become pregnant as we may need to adjust your treatment. Certain disease modifying treatments for MS are better than others for those seeking to become pregnant. Please also let me know when you find out you're pregnant.     - I do not prescribe long term opioids for nerve pain. Prescription opioids lead to worse MS symptoms, with studies showing significantly worse fatigue scores and worse pain scores compared to non-opioid users. Neurological pain is best managed by evidence-based non-opiate neuropathic pain medications. Complicated chronic pain management or non-neurological pain management will be referred to pain clinic.     - I do not recommend the use of marijuana or ingestion of THC /CBD containing products. Studies show marijuana worsen cognitive scores in MS and contribute to worsened brain fog and fatigue. Studies do not support the use of medical marijuana for spasticity or pain given a lack of objective efficacy to relieving spasticity. Topical CBD creams and ointments available over hte counter are safer as very little gets into your bloodstream. Instead, we use a comprehensive evidence-based approach in managing spasticity and pain that is individualized to the patient.      6) Phone Numbers    Drug Patient Assistance Programs  If you run into insurance / supply issues with a branded DMT, you may be able to get a temporary supply from the manufacturer. Also call us  & your infusion center.     Briumvi ([employer]). [phone]. Fax [phone]    Soliris & Ultomiris ([employer] One Source). [phone]. Fax [phone]. [email or web address]    Uplizna (Horizon By Your Side) [phone]. Fax [phone]    Enspryng [facility] Access Solution) [phone]. Fax [phone]. Text [phone]    Zilbrysq ([facility], [facility]). [phone]. Fax [phone].     Kesimpta (Novartis Alongside). [phone]. Fax  [phone]    Mavenclad (EMD Serono MS Lifelines). [phone]. Fax [phone]    Ocrevus & Ocrevus [facility] [employer]). [phone]. Fax [phone]. Text [phone]    Zeposia ([employer] One Source) [phone]; Fax [phone]    Vyvgart & Vyvgart Hytrulo (Argenx  MyPath) [phone]. Fax [phone]    Infusion Centers  [facility]  [address] [place], [place] [address]  Phone: [phone]    [facility] Fax [phone]    Devonne Perry @ [employer] Phone Number: [phone]. Fax [phone]    [facility]  Address: [address] [place], [place] [address] and other locations  Phone: [phone]    Fax: [phone]    [facility]  Fax: [phone]  Phone: [phone] or [phone]    Physical Therapy  [facility] [phone]   [facility]   [phone]   [facility] [phone]   [facility]   [phone]   [facility]    [phone]     Dr Truman Hopkins has home-PT tips for MS  [email or web address]    Thank you,      Sammi Lockwood, MD PHD  Director, Neuroimmunology & MS Center  [facility]  [address], [place], [place] [address]    Telephone [phone]  Fax [phone]   The fastest response is via [place]

## 2024-06-17 ENCOUNTER — Ambulatory Visit: Admitting: Neurology

## 2024-06-27 NOTE — ED Provider Notes (Signed)
 [facility]  [facility] EMERGENCY DEPT  [address]  [place] [address]  Dept: [phone]  Loc Appt: [phone]  Loc: [phone]           Transfer of Care Note     I have taken signout from Dr. Jocelyn on [name] at this time (2:00 AM).    ___________________________________________________    Pertinent Hx/Care thus far:   68 y.o. female with PMH of hypertension, A-fib, bipolar disorder, anemia presents to ED due to chest pain.  States has been ongoing for 3 days.  States she feels short of breath sometimes.  She denies fever, chills, cough, runny nose,  recent travel, abdominal pain, nausea, vomiting, sick contacts, any other complaints.       Labs/Imaging to f/u:     I have taken over this pt with repeat troponin still pending.      Vital Signs this ED Visit     Patient Vitals for the past 24 hrs:   Temp Heart Rate Resp BP BP Mean SpO2 Weight   06/26/24 2255 -- -- -- -- -- -- 86.2 kg (190 lb)   06/26/24 2224 97.8 F (36.6 C) 81 18 131/63 86 MM HG 100 % --            Plan/MDM    Repeat troponin is unremarkable.        Patient feeling better after treatment.   No significant concern on labs or imaging to warrant further testing.   At discharge, pt looked well, nontoxic, no distress, and is good candidate for outpatient follow up. No signs of toxicity to suggest need for further labs, imaging or for admission.   Results discussed w/ patient/family.  All questions were answered.       Diagnosis   (R07.9) Chest pain, unspecified type        Disposition   Discharge      ___________________________________________________      Labs     Results for orders placed or performed during the hospital encounter of 06/26/24   EKG 12 LEAD UNIT PERFORMED   Result Value Ref Range    Heart Rate 78 bpm    RR Interval 772 ms    Atrial Rate 77 ms    P-R Interval 166 ms    P Duration 95 ms    P Horizontal Axis -7 deg     P Front Axis 40 deg    Q Onset 499 ms    QRSD Interval 87 ms    QT Interval 327 ms    QTcB 372 ms    QTcF 357 ms    QRS Horizontal Axis -11 deg    QRS Axis 47 deg    I-40 Front Axis 34 deg    t-40 Horizontal Axis -19 deg    T-40 Front Axis 47 deg    T Horizontal Axis 184 deg    T Wave Axis 233 deg    S-T Horizontal Axis 170 deg    S-T Front Axis 218 deg    Impression - BORDERLINE ECG -     Impression SR-Sinus rhythm-normal P axis, V-rate 50-99     Impression       REPB-Borderline  repolarization abnormality-ST dep & abnormal T    Impression       -No significant change since prior tracing, 06/08/2024-   BASIC METABOLIC PANEL   Result Value Ref Range    Potassium 3.5  3.5 - 5.5 mmol/L    Sodium 140 133 - 145 mmol/L    Chloride 104 98 - 110 mmol/L    Glucose 96 70 - 99 mg/dL    Calcium  9.3 8.4 - 10.5 mg/dL    BUN 11 6 - 22 mg/dL    Creatinine 1.1 0.8 - 1.4 mg/dL    CO2 24 20 - 32 mmol/L    eGFR 53.0 (L) >60.0 mL/min/1.73 sq.m.    Anion Gap 12.0 3.0 - 15.0 mmol/L   TROPONIN   Result Value Ref Range    Troponin (T) Quant High Sensitivity (5th Gen) <6 0 - 19 ng/L   CBC WITH DIFFERENTIAL AUTO   Result Value Ref Range    WBC 7.9 4.0 - 11.0 K/uL    RBC 4.22 3.80 - 5.20 M/uL    HGB 10.6 (L) 11.7 - 16.1 g/dL    HCT 64.9 (L) 64.8 - 48.3 %    MCV 83 80 - 99 fL    MCH 25 (L) 26 - 34 pg    MCHC 30 (L) 31 - 36 g/dL    RDW 84.6 89.9 - 84.4 %    Platelet 343 140 - 440 K/uL    MPV 10.3 9.0 - 13.0 fL    Segmented Neutrophils (Auto) 50 40 - 75 %    Lymphocytes (Auto) 39 20 - 45 %    Monocytes (Auto) 7 3 - 12 %    Eosinophils (Auto) 4 0 - 6 %    Basophils (Auto) 1 0 - 2 %    Absolute Neutrophils (Auto) 3.9 1.8 - 7.7 K/uL    Absolute Lymphocytes (Auto) 3.1 1.0 - 4.8 K/uL    Absolute Monocytes (Auto) 0.5 0.1 - 1.0 K/uL    Absolute Eosinophils (Auto) 0.3 0.0 - 0.5 K/uL    Absolute Basophils (Auto) 0.0 0.0 - 0.2 K/uL   TROPONIN   Result Value Ref Range    Troponin (T) Quant High Sensitivity (5th Gen) <6 0 - 19 ng/L   EDIE VALUES   Result  Value Ref Range    EDIE SECURITY      EDIE PDMP 1     EDIE CAREPLAN      EDIE FREQUENCY 1     EDIE FACILITY       *Note: Due to a large number of results and/or encounters for the requested time period, some results have not been displayed. A complete set of results can be found in Results Review.              Radiology     CHEST PORTABLE - Chest Pain   Final Result   IMPRESSION:   No acute cardiopulmonary abnormality.      Electronically signed by: Dallas Sport, MD  Board Certified   Radiologist 06/26/2024 10:57 PM      EKG 12 LEAD UNIT PERFORMED   Final Result

## 2024-07-09 ENCOUNTER — Telehealth: Payer: Self-pay | Admitting: Neurology

## 2024-07-09 NOTE — Telephone Encounter (Addendum)
 Copied from CRM [id number]. Topic: Clinical Support - Medical Question  >> Jul 09, 2024  9:41 AM Haylee G wrote:  Patient called to inform Dr. Deatrice that since taking metaxalone  (SKELAXIN ) 800 MG tablet she has notice symptoms. Per patient stated she is experiencing upset stomach, balence issues, and pain leg area. Per request for a call back from Dr. Deatrice or nurse regarding this matter.  Confirmed a cal back number for any additional questions.    [phone]

## 2024-07-16 NOTE — Telephone Encounter (Signed)
 Return call placed to patient.    Voice mail message left with call back information.    MyChart sent

## 2024-07-24 ENCOUNTER — Ambulatory Visit

## 2024-07-25 NOTE — Progress Notes (Signed)
 Subjective  [name] is a 68 y.o. female who presents for Palliative Care Follow-up.    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 Pertinent medical Hx includes CHF. Since her last visit she developed worsening BLE edema that was causing her pain. She contacted on call and was advised to double Torsemide to 40 mg daily x 3 days. Pt reports pain improving and some improvement of edema, she has one day left of the increase diuretic dose. She denies CP, increase dyspnea, cough, fever, or chills, redness, or warmth. Pt admits to a diet high in sodium and frequently consumes processed foods. She is unable to monitor her weight daily as her scale is broken. She has compression stocking but has not been wearing them. Pt reports episodic peripheral edema in the past. Pt reports she has f/up with Cardiology next week.          Review of Systems   Constitutional:  Positive for appetite change and fatigue. Negative for activity change, diaphoresis and fever.   HENT: Negative.     Eyes: Negative.    Respiratory:  Negative for cough, chest tightness and shortness of breath.    Cardiovascular:  Positive for leg swelling. Negative for chest pain and palpitations.   Gastrointestinal:  Negative for constipation, diarrhea, nausea and vomiting.   Endocrine: Negative.    Genitourinary: Negative.  Negative for decreased urine volume.   Musculoskeletal:  Positive for gait problem.   Skin: Negative.  Negative for color change, rash and wound.   Allergic/Immunologic: Negative.    Neurological:  Negative for syncope and weakness.   Hematological: Negative.    Psychiatric/Behavioral: Negative.       [place] Symptom Assessment System  Completed With/By: Patient/Self  Pain Score (If > or = 3, complete the Pain Assessment): No pain  Tiredness Score: Not tired  Nausea Score: Not nauseated  Depression Score: Not depressed  Anxiety Score: Not anxious  Drowsiness Score: Not drowsy  Appetite Score: Best appetite  Wellbeing Score:  Best feeling of wellbeing  Dyspnea Score: No shortness of breath    Patient Active Problem List    Diagnosis Date Noted   . Encounter for palliative care 05/28/2024   . Myelopathy 04/23/2024   . Atrial fibrillation 09/12/2023   . Pain, chronic 09/12/2023   . HTN (hypertension) 09/12/2023   . Epilepsy 09/12/2023   . Debility 09/12/2023   . At risk for falling 09/12/2023   . Dysphagia 09/12/2023   . COPD (chronic obstructive pulmonary disease) 09/12/2023   . Congestive heart failure (CHF) 09/12/2023     Past Surgical History:   Procedure Laterality Date   . CT ANGIOGRAM CHEST  09/27/2018    CT ANGIOGRAM CHEST 09/27/2018   . CT ANGIOGRAM CHEST  02/05/2017    CT ANGIOGRAM CHEST 02/05/2017   . CT ANGIOGRAM CHEST  01/16/2017    CT ANGIOGRAM CHEST 01/16/2017   . CT ANGIOGRAM CHEST  12/23/2013    CT ANGIOGRAM CHEST 12/23/2013   . MR ANGIOGRAM HEAD WO IV CONTRAST  06/16/2019    MR ANGIOGRAM HEAD WO IV CONTRAST 06/16/2019   . MR ANGIOGRAM HEAD WO IV CONTRAST  12/01/2015    MR ANGIOGRAM HEAD WO IV CONTRAST 12/01/2015   . MR ANGIOGRAM HEAD WO IV CONTRAST  05/28/2014    MR ANGIOGRAM HEAD WO IV CONTRAST 05/28/2014   . MR ANGIOGRAM NECK W AND WO IV CONTRAST  06/16/2019    MR ANGIOGRAM NECK W AND WO IV  CONTRAST 06/16/2019     Current Outpatient Medications   Medication Instructions   . albuterol  HFA 90 mcg/act inhaler 2 puffs, Every 4 hours RT   . albuterol  HFA 90 mcg/act inhaler 2 puffs, Every 6 hours PRN   . amLODIPine  (Norvasc ) 2.5 MG tablet 1 tablet, Daily   . apixaban  (Eliquis ) 5 MG tablet Oral   . budesonide -formoterol  (Symbicort ) 80-4.5 MCG/ACT inhaler 2 puffs, Daily RT   . cloNIDine (CATAPRES) 0.1 mg, 2 times daily   . dapagliflozin (FARXIGA) 10 mg, Oral, Daily RT   . diclofenac sodium 1 % gel Topical   . ezetimibe (Zetia) 10 MG tablet 1 tablet, Daily   . hyoscyamine (Anaspaz,Levsin) 0.125 MG tablet Oral   . lamoTRIgine  (LAMICTAL ) 100 mg, Oral, Daily   . lurasidone  (LATUDA ) 120 mg, Oral, Every evening   . metaxalone  (SKELAXIN ) 2,400 mg, 2 times  daily   . metoprolol  succinate XL (TOPROL -XL) 25 mg, Oral, Daily RT   . mycophenolate  (MYFORTIC ) 720 mg, 2 times daily   . pantoprazole  (PROTONIX ) 40 mg, Oral   . pregabalin  (LYRICA ) 100 mg, 2 times daily   . promethazine  (Phenergan ) 25 MG tablet Oral   . QUEtiapine  (SEROQUEL ) 25 mg, Daily PRN   . QUEtiapine  XR (SEROQUEL  XR) 50 mg, Oral, Every 24 hours   . rizatriptan (MAXALT) 10 mg, Once as needed   . rOPINIRole  (Requip ) 0.5 MG tablet Oral   . topiramate  (TOPAMAX ) 50 mg, 2 times daily   . torsemide (Demadex) 20 MG tablet Oral   . traMADol  (ULTRAM ) 50 mg, Oral     Allergies   Allergen Reactions   . Statins      rhabdomyalisis   . Iodinated Contrast Media    . Codeine Itching   . Latex Itching   . Macrobid [Nitrofurantoin] Itching   . Magnesium -Containing Compounds Itching   . Other      Nitro - itch  Singular   . Penicillins Rash   . Tetracycline Rash      Reviewed by Provider:   Allergies  Meds  Problems  Med Hx  Surg Hx  Fam Hx            SDOH  No Psychosocial Concerns            Objective  Blood pressure 130/78, pulse 80, temperature 36.5 C (97.7 F), temperature source Temporal, height 1.651 m (5' 5), weight 190 lb (86.2 kg), SpO2 98%.  Oxygen Therapy: None (Room air)  Physical Exam  Constitutional:       General: She is not in acute distress.     Appearance: Normal appearance. She is normal weight. She is not ill-appearing or diaphoretic.   HENT:      Head: Normocephalic and atraumatic.      Mouth/Throat:      Mouth: Mucous membranes are moist.   Eyes:      Conjunctiva/sclera: Conjunctivae normal.   Cardiovascular:      Rate and Rhythm: Normal rate and regular rhythm.      Pulses: Normal pulses.      Heart sounds: Normal heart sounds.   Pulmonary:      Effort: Pulmonary effort is normal. No respiratory distress.      Breath sounds: Normal breath sounds.   Abdominal:      General: Abdomen is flat. Bowel sounds are normal.      Palpations: Abdomen is soft.   Musculoskeletal:      Cervical back: Neck  supple.  Right lower leg: 2+ Edema present.      Left lower leg: 2+ Edema present.   Skin:     General: Skin is warm and dry.   Neurological:      General: No focal deficit present.      Mental Status: She is alert and oriented to person, place, and time. Mental status is at baseline.   Psychiatric:         Mood and Affect: Mood normal.         Behavior: Behavior normal.         Thought Content: Thought content normal.         Judgment: Judgment normal.         PPS Score: 60%  Morse Fall Risk Score: 15  ADL Screening  Dressing: Independent  Feeding: Independent  Bathing: Independent  Toileting: Independent  In/Out Bed: Independent  Walks in Home: Independent    Advance Care Planning  Advance Directive on file?: <no information>  Advance Directive Reviewed? Yes  ACP Discussion  What is your understanding now of where you are with your illness?: appropriate  How much information about what is likely to be ahead with your illness would you like from me?: wants to be fully informed  Shared Understanding: uncertain  Patient Emotions: acceptance  What are your most important goals if your health situation worsens?: being comfortable, living as long as possible, being independent  Comments: Daughter is health care advocate. Patient prefers all life prolonging interventions, reduced symptoms burden, to maintain as much independence as possible.    GOC conversation initiated, POST form reviewed. Patient would like to discuss with daughter.  06/28/2023 Confirmed existing GOC and discussed ACP completion  10/3- Patient wants all medical interventions if needed. Her daughter is aware.   What are your biggest fears and worries about the future with your health?: physical suffering  What gives you strength as you think about the future of your illness?: family, friends or community  What abilities are so critical to your life that you can't imagine living without them? Or, what makes life meaningful?: being conscious of  surroundings, communicating with others, being able to care for oneself, living independently  If you become sicker, how much are you willing to go through for the possibility of gaining more time?: being on a ventilator, being in the ICU, enduring physical discomfort  How much does your family know about your priorities and wishes?: patient has had extensive discussions with family            Assessment/Plan  1. Chronic congestive heart failure, unspecified heart failure type (Primary)  Overview:  Cardiology - Dr. Read; NYHA Class II  Assessment & Plan:  Pt with increased BLE edema, Amlodipine  may be constructing factor as well as high sodium diet. Discussed with pt who will discuss with cardiology at next visit. No CP, dyspnea, orthopnea, cough. VSS, lungs CTA, NAD    Condition: stable  Weight Gain: No  Dyspnea: No  Edema:+2  Medication Adherences: Yes    Plan of Care:  Continue current medications and routine cardiology follow up, Advised on low sodium diet and regular weight monitoring, and [facility] 24/7 hr phone number and to call with questions and concerns. Recommend elevation and compression stockings for edema.    Education:  It is important to proactively avoid and address congestive heart failure exacerbation. If you are experiencing any of the following please call [facility], Palliative Care: Weight increases 2lbs overnight  or 5 lbs from baseline; increasing shortness of breath; and increased swelling.      Condition: stable  Weight Gain: No  Dyspnea: No  Edema:+1  Medication Adherences: Yes    Plan of Care:  Continue current medications and routine cardiology follow up, Advised on low sodium diet and regular weight monitoring, and [facility] 24/7 hr phone number and to call with questions and concerns.    Education:  It is important to proactively avoid and address congestive heart failure exacerbation. If you are experiencing any of the following please call [facility],  Palliative Care: Weight increases 2lbs overnight or 5 lbs from baseline; increasing shortness of breath; and increased swelling.    Other orders  -     Follow Up In Palliative Medicine  -     Follow Up In Palliative Medicine; Future      Palliative Care Follow-up Expected on   Palliative Care Follow-up Expected on 08/25/2024     Moon, Kristin, NP

## 2024-07-31 ENCOUNTER — Ambulatory Visit

## 2024-08-01 NOTE — Discharge Summary (Signed)
 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 female with past medical history of nonobstructive CAD, atrial fibrillation [on Eliquis ], heart failure with preserved ejection fraction, COPD epilepsy, autoimmune myelitis [dorsal column, myelopathy], does not have stiff person syndrome per Neuro,Bipolar disorder, history of CVA post endarterectomy, type 2 diabetes comes in with retrosternal chest pain and dizziness.  Her cardiac workup was unremarkable including troponins and EKG.  It appeared to be atypical chest pain.  It was nonreproducible.  Her left heart cath in 07/2021 had been with normal coronaries and echo done on 07/31/24 showed EF of 55%, grade 1 diastolic dysfunction and moderate AR.  There was no pulmonary hypertension.  She was seen by cardiology and recommended outpatient follow-up with Dr. Read on discharge.  As far as dizziness is concerned patient's A-fib was rate  controlled at 54.  Orthostatics were negative.  Patient was seen by PT OT and recommended no therapy at the time of discharge.           Chief Complaint/History of Present Illness:  Date of admission: 07/31/2024  Chief complaints: Chest pain  HPI:( per admitting provider)  [name] is an 68 y.o. female who came from Home with past medical history of essential hypertension, paroxysmal atrial fibrillation, CVA with no residual deficit, autoimmune myelitis, dyslipidemia, hypertension, came to the hospital with episode of chest pain discomfort started this morning of substernal location with radiation to the arm, with lightheadedness, did not lose the consciousness but feeling lightheaded and dizzy, which prompted her to come to the hospital further evaluation management, in the ED initial workup including EKG and troponin within normal range, on current of 4 ongoing chest pain with history of nonobstructive coronary disease patient will be admitted for cardiology evaluation and further workup, running any orthopnea paroxysmal nocturnal dyspnea reported, no fever or chills or cough reported,       Procedures:  None      Consultants:  Transport Planner  Consulting Provider: Corporate Treasurer, Grp  Consulting Provider: Read Papas, MD      Disposition: Home        Diagnostic Studies:  Recent Results (from the past 2 weeks)   1. Echocardiogram Complete   Result Value Ref Range    EF Echo 55     Narrative    CONCLUSIONS    * Left ventricular systolic function is normal with an ejection fraction of 55 % by Simpson's biplane.    * Left ventricular diastolic function: normal left atrial pressure with grade I diastolic dysfunction.    * Right ventricular systolic function is normal.    * There is mild diffuse thickening (sclerosis) of the aortic valve cusps.    * There is moderate aortic valve regurgitation.    * Mitral valve has mild leaflet calcification.    * There is mild mitral valve regurgitation.    *  There is mild tricuspid valve regurgitation.    * No pulmonary hypertension, estimated pulmonary arterial systolic pressure is 31 mmHg.    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    Comparison    * Compared to prior study from 07/12/2023.      Patient Info  Name:     [name]  Age:     68 years  DOB:     1956-06-12  Gender:     Female  MRN:     [id number]  Ht:     65 in  Wt:     190 lb  BSA:     2.02 m2  BP:     116  /     59 mmHg  Exam Date:     07/31/2024 1:53 PM  Patient Status:     IP    Exam Type:     ECHO CARDIOGRAM COMPLETE    Indications      Chest Pain -      Left Ventricle    Left ventricular systolic function is normal with an ejection fraction of 55 % by Simpson's biplane. Left ventricular segmental wall motion is normal. Left ventricular chamber size is normal. There is no left ventricular hypertrophy. Left ventricular diastolic function: normal left atrial pressure with grade I diastolic dysfunction.    Right Ventricle    TAPVE is 12.3 cm/s. Right ventricular systolic function is normal. Right ventricular chamber dimension is normal.      Left Atrium    Left atrial chamber is normal with a left atrial volume index biplane method of disk (BP MOD) of 28 ml/m^2.  Right Atrium    Right atrial chamber size is normal.      Aortic Valve    The aortic valve is tricuspid. There is no aortic valve stenosis with a peak velocity of 1.8 m/s, mean gradient of 7 mmHg, and aortic valve area of 2.89 cm2. There is mild diffuse thickening (sclerosis) of the aortic valve cusps. There is moderate aortic valve regurgitation.    Pulmonic Valve    The pulmonic valve is not well visualized. There is no pulmonic valve stenosis. There is no pulmonic regurgitation.    Mitral Valve    The mitral valve has thickened leaflets. There is no mitral valve stenosis. There is mild mitral valve regurgitation. Mitral valve has mild leaflet calcification.    Tricuspid Valve    The tricuspid  valve leaflets are normal. There is no tricuspid valve stenosis. There is mild tricuspid valve regurgitation. No pulmonary hypertension, estimated pulmonary arterial systolic pressure is 31 mmHg.      Pericardium/Pleural    There is trivial pericardial effusion located anterior of the RV.    Inferior Vena Cava    Normal inferior vena cava diameter with >50% collapse upon inspiration consistent with normal right atrial pressure, 5 mmHg.    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 The aortic root at the sinus of Valsalva is normal measuring 2.80 cm with an index of 1.4. The aortic measurements are indexed to body surface area.      Left Ventricular Outflow Tract  ----------------------------------------------------------------------  Name                                 Value        Normal  ----------------------------------------------------------------------    LVOT 2D  ----------------------------------------------------------------------  LVOT Diameter                       2.2 cm                   LVOT Doppler  ----------------------------------------------------------------------  LVOT Peak Velocity              131.0 cm/s                 LVOT Peak Gradient                7.0 mmHg                 LVOT Mean Gradient                4.0 mmHg                 LVOT VTI                           31.8 cm                 LVOT Stroke Volume                120.8 ml                 LVOT Stroke Volume Index        59.9 ml/m2                 LVOT Area  3.8 cm2    Pulmonic Valve  ----------------------------------------------------------------------  Name                                 Value        Normal  ----------------------------------------------------------------------    PV Doppler  ----------------------------------------------------------------------  PV Peak Velocity                 82.7 cm/s                 PV Peak Gradient                   3.0 mmHg                 PV Mean Gradient                  1.0 mmHg    Mitral Valve  ----------------------------------------------------------------------  Name                                 Value        Normal  ----------------------------------------------------------------------    MV Diastolic Function  ----------------------------------------------------------------------  MV E Peak Velocity               97.8 cm/s                 MV A Peak Velocity               96.4 cm/s                 MV E/A                                 1.0       0.8-2.0   MV Decel Time PW                    222 ms                   MV Annular TDI  ----------------------------------------------------------------------  MV Septal e' Velocity             6.6 cm/s         >=7.0   MV E/e' (Septal)                      14.7                 MV Lateral e' Velocity            8.3 cm/s        >=10.0   MV E/e' (Lateral)                     11.8                 MV e' Average                     7.5 cm/s                 MV E/e' (Average)                     13.3        <=14.0    Tricuspid  Valve  ----------------------------------------------------------------------  Name                                 Value        Normal  ----------------------------------------------------------------------    TV Regurgitation Doppler  ----------------------------------------------------------------------  TR Peak Velocity                253.0 cm/s       <=280.0   TR Peak Gradient                 26.0 mmHg                   Estimated PAP/RSVP  ----------------------------------------------------------------------  RA Pressure                         5 mmHg            <8   PA Systolic Pressure               31 mmHg           <35   RV Systolic Pressure               31 mmHg           <36    Aorta  ----------------------------------------------------------------------  Name                                 Value         Normal  ----------------------------------------------------------------------    Ascending Aorta  ----------------------------------------------------------------------  Sinus of Valsalva Diameter          2.8 cm                 Prox Asc Ao Diameter                4.0 cm         <=3.1   Prox Asc Ao Diameter Index       2.0 cm/m2         <=1.9    Venous  ----------------------------------------------------------------------  Name                                 Value        Normal  ----------------------------------------------------------------------    IVC/SVC  ----------------------------------------------------------------------  IVC Diameter (Exp 2D)               1.5 cm         <=2.1    Aortic Valve  ----------------------------------------------------------------------  Name                                 Value        Normal  ----------------------------------------------------------------------    AV Doppler  ----------------------------------------------------------------------  AV Peak Velocity                   1.8 m/s                 AV Peak Gradient                 13.0 mmHg  AV Mean Gradient                  7.0 mmHg                 AV VTI                             41.8 cm                 AV Area (Cont Eq VTI)              2.9 cm2         >=3.0   AV Area Index (Cont Eq VTI)     1.4 cm2/m2                 AV Area (Cont Eq Vel)              2.7 cm2                 AV Area Index (Cont Eq Vel)     1.4 cm2/m2                  AV Dimensionless Index  (Peak Velocity)                        0.7                  AV Dimensionless Index  (VTI)                                  0.8                   AV Regurgitation 2D  ----------------------------------------------------------------------  LVOT Area                          3.8 cm2                   AV Regurgitation Doppler  ----------------------------------------------------------------------  AR Decel Time                     627.0 ms                  AR Decel Slope                  186.0 cm/s2                 AR PHT                              182 ms    Ventricles  ----------------------------------------------------------------------  Name                                 Value        Normal  ----------------------------------------------------------------------    LV Dimensions 2D/MM  ----------------------------------------------------------------------   IVS Diastolic Thickness  (2D)                                0.8 cm       0.5-0.9   LVID Diastole (2D)  5.0 cm       3.8-5.2    LVPW Diastolic Thickness  (2D)                                1.0 cm       0.5-0.9   LVID Systole (2D)                   3.2 cm       2.2-3.5   LVID Diastolic Index (2D)        2.5 cm/m2       2.3-3.1   LVOT Diameter                       2.2 cm                   LV Fractional Shortening/Ejection Fraction 2D/MM  ----------------------------------------------------------------------  LV EF (2D Teichholz)                  65 %                  LV Fractional Shortening  (2D)                                36.0 %     27.0-45.0   LV SV (Cubed)                      92.2 ml                 LV SV (Teich)                      77.3 ml                 LV EDV (Cubed)                    125.0 ml                 LV ESV (Cubed)                     32.8 ml                 LV EDV (Teich)                    118.2 ml                 LV ESV (Teich)                     41.0 ml                   RV Dimensions 2D/MM  ----------------------------------------------------------------------   RV Basal Diastolic  Dimension                           3.6 cm       2.5-4.1   TAPSE                               1.4 cm         >=1.7   RV s' Velocity  12.3 cm/s    Atria  ----------------------------------------------------------------------  Name                                 Value        Normal  ----------------------------------------------------------------------    LA  Dimensions  ----------------------------------------------------------------------  LA Dimension (2D)                   3.7 cm       2.7-3.8   LA Dimen Index (2D)              1.8 cm/m2                 LA Volume (BP A-L)                 59.4 ml                 LA Volume Index (BP A-L)        29.4 ml/m2         <35.0   LA Volume Index (BP MOD)        28.3 ml/m2        <=34.0   LA/Ao (2D)                             1.3      Wall Motion Scoring  Wall Motion Scoring Index:     1.00      Technical Quality:     Adequate image quality    Procedure(s)    Complete transthoracic echocardiogram performed with 2D imaging, color Doppler, and spectral Doppler.    Staff  Ordering Provider:     Dwight Coward  Sonographer:     Melvern Leys RDCS    [phone]      Report Signatures  Finalized by Lorice Mill  MD on 07/31/2024 03:25 PM   2. CHEST PORTABLE - Chest Pain    Narrative    HISTORY:  CHEST PAIN    EXAM:  CHEST 1 VIEW    COMPARISONS:  06/26/2024    FINDINGS:      Heart size and pulmonary vascularity are normal. Lungs are clear.            Impression    IMPRESSION:  No acute finding.        Electronically signed by: Wash Pereyra, MD  Board Certified Radiologist  07/31/2024 11:31 AM   3. EKG 12 LEAD UNIT PERFORMED   Result Value Ref Range    Heart Rate 54 bpm    RR Interval 1,112 ms    Atrial Rate 54 ms    P-R Interval 169 ms    P Duration 115 ms    P Horizontal Axis -2 deg    P Front Axis 42 deg    Q Onset 499 ms    QRSD Interval 110 ms    QT Interval 511 ms    QTcB 485 ms    QTcF 493 ms    QRS Horizontal Axis -10 deg    QRS Axis 21 deg    I-40 Front Axis 33 deg    t-40 Horizontal Axis -21 deg    T-40 Front Axis 20 deg    T Horizontal Axis -31 deg    T Wave Axis 18 deg    S-T Horizontal Axis 172 deg  S-T Front Axis 210 deg    Impression - BORDERLINE ECG -     Impression SB-Sinus bradycardia-rate<0     Impression       -Borderline repolarization abnormality, more pronounced since July 24, 2024,   possible ischemia, needs clinical  correlation-      Impression LQTB-Borderline  prolonged QT interval-QTc >467mS               Dear [name], MD   [name] is advised to follow up with you within 1-2 weeks.       Scheduled appointments:  Follow Up Info (next 45 days)       Follow up with Pouch Waynard, MD    Specialty: Family Practice    Phone: [phone]    Where: [facility]    Follow up with Read Papas, MD in 2 weeks    Specialty: Cardiology    Phone: [phone]    Where: [facility]      Friday Aug 02, 2024    My Health Care Coordinator Outreach Visit with Delores Aubry HERO, RN at  2:00 PM    Where: [facility] ([facility])      Wednesday Aug 07, 2024    My Health Care Coordinator Outreach Visit with Delores Aubry HERO, RN at 10:15 AM    Where: [facility])      Monday Sep 02, 2024    Prediabetes Follow Up with Interpreter with [facility] ALLISON FARROW at  1:00 PM    Where: [facility] ([facility])      [facility]  [facility]  [address]  [place] [address]  [phone] [facility]  [facility]  [address]  [place] [address]  [phone]                 Discharge Medication List        START taking these medications      lidocaine  5 % Ptmd  Apply 1 patch as directed for 12 hours every 24 hours (12 hours on, 12 hours off)  Dispense: 30 Patch  Refills: 0  Commonly known as: Lidoderm              CHANGE how you take these medications      torsemide 20 mg Tabs  Take 1 Tab by Mouth Twice Daily.  Refills: 0  Commonly known as: Demadex  What changed: when to take this             CONTINUE taking these medications      albuterol  90 mcg/actuation Hfaa inhaler  Take 2 Puffs inhaled by mouth Every 4 Hours As Needed.  Refills: 0  Commonly known as:  ProventiL , Ventolin , ProAir       Ammonium Lactate 12 % Crea  Use 1 Application to affected area Daily as needed (feet).  Refills: 0      apixaban  5 mg Tabs  Take 1 Tab by Mouth Twice Daily.  Refills: 0  Commonly known as: Eliquis       budesonide -formoteroL  80-4.5 mcg/actuation Hfaa  Take 2 Puffs inhaled by mouth 2 (two) times a day.  Refills: 0  Commonly known as: Symbicort       cloNIDine HCL 0.1 mg Tabs  Take 0.5 Tabs by Mouth Twice Daily.  Dispense: 90 Tab  Refills: 1  Commonly known as: Catapres      dapagliflozin propanediol 10 mg Tabs  Take 1 Tab by Mouth Once a Day.  Dispense: 30 Tab  Refills: 0  Commonly known as: Farxiga      ezetimibe 10 mg Tabs  Take 1 Tab by Mouth Once a Day.  Refills: 0  Commonly known as: Zetia      lamoTRIgine  100 mg Tabs  Take 1 Tab by Mouth Once a Day. Indications: epilepsy  Dispense: 30 Tab  Refills: 0  Commonly known as: LaMICtal       lurasidone  40 mg Tabs  Take 4 Tabs by Mouth Every Night at Bedtime.  Refills: 0  Commonly known as: Latuda       Magnesium  Glycinate 100 mg Tabs  Take 100 mg by Mouth Once a Day. Indications: patient states she takes this over the counter  Refills: 0      metaxalone  800 mg Tabs  Take 1 Tab by Mouth 4 Times Daily.  Refills: 0  Commonly known as: Skelaxin       metoprolol  XL 25 mg Tb24  Take 0.5 Tabs by Mouth Once a Day.  Refills: 0  Commonly known as: Toprol  XL      Mycophenolate  Sodium 360 mg Tbec  Take 2 Tabs by Mouth Twice Daily.  Refills: 0      pantoprazole  40 mg Tbec  TAKE 1 TABLET BY MOUTH TWICE A DAY  Dispense: 180 Tab  Refills: 1  Commonly known as: Protonix       pregabalin  100 mg Caps  Take 1 Cap by Mouth every evening.  Refills: 0  Commonly known as: Lyrica       * QUEtiapine  50 mg Tabs  Take 1 Tab by Mouth Every Night at Bedtime.  Refills: 0      * QUEtiapine  25 mg Tabs  Take 1 Tab by Mouth nightly as needed.  Refills: 0  Commonly known as: SEROquel       rOPINIRole  0.5 mg Tabs  Take 1 Tab by Mouth Every Night at Bedtime.  Refills: 0  Commonly  known as: Requip       topiramate  25 mg Tabs  TAKE 2 TABLETS BY MOUTH TWICE A DAY  Dispense: 180 Tab  Refills: 0  Commonly known as: Topamax       traMADoL  50 mg Tabs  Take 1 Tab by Mouth Every 6 Hours As Needed for Moderate Pain (Pain Score 4-6).  Refills: 0  Commonly known as: Ultram            * * This list has 2 medication(s) that are the same as other medications prescribed for you. Read the directions carefully, and ask your doctor or other care provider to review them with you.                               Discharge Exam:   BP 107/55   Pulse 62   Temp 98.2 F (36.8 C)   Resp 16   Ht 65 (1.651 m)   Wt 85.8 kg (189 lb 2.5 oz)   LMP 10/13/1979   SpO2 100%   BMI 31.48 kg/m     Vitals and nursing note reviewed.   Constitutional:       Appearance: Alert and oriented x3, appearance normal, obese  HENT:      Head: Normocephalic and atraumatic.      Nose: Nose normal.      Mouth/Throat:  Mouth: Mucous membranes are moist.   Eyes:      Extraocular Movements: Extraocular movements intact.      Pupils: Pupils are equal, round, and reactive to light.   Cardiovascular:      Rate and Rhythm: Normal rate and regular rhythm.      Pulses: Normal pulses.      Heart sounds: Normal heart sounds.   Pulmonary:      Effort: Pulmonary effort is normal.      Breath sounds: Normal breath sounds.   Abdominal:      General: Abdomen is flat. Bowel sounds are normal.      Palpations: Abdomen is soft.   Musculoskeletal:         General: Normal range of motion.  No edema cyanosis.        No deformity  Skin:     General: Skin is warm.  No rash        Capillary Refill: Capillary refill takes less than 2 seconds.   Neurological:      General: No focal deficit present.      Mental Status: Alert and Oriented  Psychiatric:         Mood and Affect: Mood normal.   Affect congruent to mood     Behavior: Behavior normal.                       Recent Results (from the past 72 hours)   BASIC METABOLIC PANEL    Collection Time: 07/31/24  10:35 AM   Result Value Ref Range    Potassium 3.1 (L) 3.5 - 5.5 mmol/L    Sodium 143 133 - 145 mmol/L    Chloride 108 98 - 110 mmol/L    Glucose 105 (H) 70 - 99 mg/dL    Calcium  9.4 8.4 - 10.5 mg/dL    BUN 10 6 - 22 mg/dL    Creatinine 1.0 0.8 - 1.4 mg/dL    CO2 23 20 - 32 mmol/L    eGFR 59.0 (L) >60.0 mL/min/1.73 sq.m.    Anion Gap 12.0 3.0 - 15.0 mmol/L   TROPONIN    Collection Time: 07/31/24 10:35 AM   Result Value Ref Range    Troponin (T) Quant High Sensitivity (5th Gen) <6 0 - 19 ng/L   NT PROBNP    Collection Time: 07/31/24 10:35 AM   Result Value Ref Range    NT proBNP 80 <=125 pg/mL   MAGNESIUM  SERUM    Collection Time: 07/31/24 10:35 AM   Result Value Ref Range    Magnesium  2.1 1.6 - 2.5 mg/dL   CBC WITH DIFFERENTIAL AUTO    Collection Time: 07/31/24 10:35 AM   Result Value Ref Range    WBC 4.6 4.0 - 11.0 K/uL    RBC 4.01 3.80 - 5.20 M/uL    HGB 10.4 (L) 11.7 - 16.1 g/dL    HCT 65.6 (L) 64.8 - 48.3 %    MCV 86 80 - 99 fL    MCH 26 26 - 34 pg    MCHC 30 (L) 31 - 36 g/dL    RDW 84.9 89.9 - 84.4 %    Platelet 274 140 - 440 K/uL    MPV 10.1 9.0 - 13.0 fL    Segmented Neutrophils (Auto) 59 40 - 75 %    Lymphocytes (Auto) 31 20 - 45 %    Monocytes (Auto) 7 3 - 12 %    Eosinophils (Auto) 2  0 - 6 %    Basophils (Auto) 1 0 - 2 %    Absolute Neutrophils (Auto) 2.7 1.8 - 7.7 K/uL    Absolute Lymphocytes (Auto) 1.5 1.0 - 4.8 K/uL    Absolute Monocytes (Auto) 0.3 0.1 - 1.0 K/uL    Absolute Eosinophils (Auto) 0.1 0.0 - 0.5 K/uL    Absolute Basophils (Auto) 0.0 0.0 - 0.2 K/uL   Troponin q3h x2    Collection Time: 08/01/24 11:11 AM   Result Value Ref Range    Troponin (T) Quant High Sensitivity (5th Gen) <6 0 - 19 ng/L   BASIC METABOLIC PANEL    Collection Time: 08/01/24 11:11 AM   Result Value Ref Range    Potassium 3.3 (L) 3.5 - 5.5 mmol/L    Sodium 144 133 - 145 mmol/L    Chloride 111 (H) 98 - 110 mmol/L    Glucose 85 70 - 99 mg/dL    Calcium  8.6 8.4 - 10.5 mg/dL    BUN 9 6 - 22 mg/dL    Creatinine 1.0 0.8 - 1.4 mg/dL     CO2 21 20 - 32 mmol/L    eGFR 59.0 (L) >60.0 mL/min/1.73 sq.m.    Anion Gap 12.0 3.0 - 15.0 mmol/L   CBC WITH DIFFERENTIAL AUTO    Collection Time: 08/01/24 11:11 AM   Result Value Ref Range    WBC 4.7 4.0 - 11.0 K/uL    RBC 3.77 (L) 3.80 - 5.20 M/uL    HGB 9.6 (L) 11.7 - 16.1 g/dL    HCT 67.5 (L) 64.8 - 48.3 %    MCV 86 80 - 99 fL    MCH 26 26 - 34 pg    MCHC 30 (L) 31 - 36 g/dL    RDW 84.5 89.9 - 84.4 %    Platelet 252 140 - 440 K/uL    MPV 10.0 9.0 - 13.0 fL    Segmented Neutrophils (Auto) 49 40 - 75 %    Lymphocytes (Auto) 39 20 - 45 %    Monocytes (Auto) 8 3 - 12 %    Eosinophils (Auto) 4 0 - 6 %    Basophils (Auto) 1 0 - 2 %    Absolute Neutrophils (Auto) 2.3 1.8 - 7.7 K/uL    Absolute Lymphocytes (Auto) 1.8 1.0 - 4.8 K/uL    Absolute Monocytes (Auto) 0.4 0.1 - 1.0 K/uL    Absolute Eosinophils (Auto) 0.2 0.0 - 0.5 K/uL    Absolute Basophils (Auto) 0.0 0.0 - 0.2 K/uL        Patient Instructions     Discharge Procedure Orders   Diet as Follows:    Cardiac Diet         Follow Up Info (next 45 days)       Follow up with Trudy Mina, MD    Specialty: Family Practice    Phone: [phone]    Where: [facility]    Follow up with Read Papas, MD in 2 weeks    Specialty: Cardiology    Phone: [phone]    Where: [facility]      Friday Aug 02, 2024    My Health Care Coordinator Outreach Visit with Delores Aubry HERO, RN at  2:00 PM    Where: [facility] ([facility])      Wednesday Aug 07, 2024    My Health Care Coordinator Outreach Visit with Brown, Leora M, RN at 10:15 AM  Where: [facility])      Monday Sep 02, 2024    Prediabetes Follow Up with Interpreter with [facility] ALLISON FARROW at  1:00 PM    Where: [facility] ([facility])      [facility] Coord[facility]  [facility]  [address]  [place] [address] [place] [address]  [phone] [facility]  [facility]  [address]  [place] [address]  [phone]            Signed   Total time for discharge - review of data, exam, discussion with providers and care-team, med-rec and orders, arranging follow up, counseling of patient and/or family and documentation was 35 minutes       Judith Sor, MD   Hospital Medicine  08/01/2024  6:44 PM          *Some images could not be shown.

## 2024-08-05 ENCOUNTER — Ambulatory Visit: Admitting: Neurology

## 2024-08-07 ENCOUNTER — Ambulatory Visit

## 2024-08-14 ENCOUNTER — Ambulatory Visit

## 2024-08-21 ENCOUNTER — Ambulatory Visit

## 2024-08-23 ENCOUNTER — Telehealth: Payer: Self-pay

## 2024-08-23 NOTE — Telephone Encounter (Signed)
 Attempted to submit PA for Mycophenolate  Sodium 360MG  dr tablets via [email or web address]    Message reads as follows:  The member recently filled this medication and will be able to return for their next refill according to their plan limits. If dosage has changed, contact the pharmacy to submit the appropriate submission clarification codes (SCC).     (Key: [id number])

## 2024-08-27 NOTE — ED Provider Notes (Signed)
 [facility]   --------------------------------------------------------------------------------------------------------------    Time of Arrival:   08/27/24 2115      Chief Complaint   Patient presents with   . CHEST PAIN (ADULT)       HPI  68 year old female presents with chest pain has been going on for the past 3 days.  She states that it is pressure-like goes to her neck and arm and is associated with shortness of breath.  States at times it is exertional.  States she has had this before at which time she was diagnosed with A-fib.  She denies any headache visual changes abdominal pain dysuria diarrhea              Physical Exam  Constitutional:       Appearance: Normal appearance.   HENT:      Head: Normocephalic.      Nose: Nose normal.   Eyes:      Extraocular Movements: Extraocular movements intact.      Conjunctiva/sclera: Conjunctivae normal.   Cardiovascular:      Rate and Rhythm: Normal rate.      Pulses: Normal pulses.   Pulmonary:      Effort: Pulmonary effort is normal.   Abdominal:      General: Abdomen is flat. There is no distension.   Musculoskeletal:         General: Normal range of motion.      Cervical back: Normal range of motion.   Lymphadenopathy:      Cervical: No cervical adenopathy.   Skin:     General: Skin is warm.      Capillary Refill: Capillary refill takes less than 2 seconds.   Neurological:      General: No focal deficit present.      Mental Status: She is alert.   Psychiatric:         Mood and Affect: Mood normal.       Past Medical History:   Diagnosis Date   . Acute, but ill-defined, cerebrovascular disease     TIA   . Anxiety    . Arthropathy     knees   . Asthma    . Bipolar disorder (HCC)    . Cardiac arrhythmia     h/o Afib on Eliquis    . Cerebral artery occlusion with cerebral infarction [facility])     CVA post endodartorectomy   . Chest pain     has history multiple admissions for this, stress test and cardiac cath negative   . Chronic anemia      Hgb ~10   . Chronic obstructive pulmonary disease (HCC) 01/19/2017   . COPD (chronic obstructive pulmonary disease) (HCC)    . Cough    . COVID    . COVID-19 vaccine series completed    . CVA (cerebral vascular accident) (HCC) 09/01/2021   . Deficiency anemia 11/2020    rx with Iron Infusions   . Depression    . Epilepsy (HCC)     per pt last Sz was approx 9-10 months ago   . Esophageal reflux    . Headache    . 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 Hemorrhoids    . History of blood transfusion     following one of her pregnancy   . History of cardiac catheterization no obstructive cad in 2017    . Hx: UTI (urinary tract infection)    . Hyperlipidemia    .  Hyperlipidemia    . Hypertension    . Leg pain    . Lower leg edema    . Migraines    . Muscle atrophy    . Paroxysmal atrial fibrillation (HCC)     on anticoagulation   . Pregnant state, incidental    . Seizure-like activity (HCC)     EEG negative   . Stable angina pectoris 02/15/2018   . Stiff person syndrome    . Syncope     recurrent, has loop recorder   . Thoracic aortic aneurysm    . TIA (transient ischemic attack)     multiple since age 53's per patient. Multiple MRI brain in the past negative for evidence of stroke. Suspected complicated migraine.   . Type 2 diabetes mellitus (HCC)     not on medication   . Unable to coordinate sucking, swallowing, and breathing 09/28/2019     Past Surgical History:   Procedure Laterality Date   . BREAST BIOPSY Right 06/18/2021    right ultrasound core biopsy with clip placed   . CARDIAC CATH  2016, 02/2016   . CAROTID ENDARTERECTOMY     . COLONOSCOPY N/A 07/01/2013    Procedure: COLONOSCOPY;  Surgeon: Neysa Katz, MD;  Location: [facility] ENDO OR LOC;  Service: Endoscopy GI;  Laterality: N/A;   . COLONOSCOPY N/A 04/13/2015    Procedure: COLONOSCOPY FLEXIBLE;  Surgeon: Glendia Sherry, MD;  Location: [facility] ENDO OR LOC;  Service: Endoscopy GI;   Laterality: N/A;   . COLONOSCOPY WITH BIOPSY N/A 04/13/2015    Procedure: COLONOSCOPY FLEXIBLE WITH BIOPSY;  Surgeon: Glendia Sherry, MD;  Location: [facility] ENDO OR LOC;  Service: Endoscopy GI;  Laterality: N/A;   . COLONOSCOPY WITH BIOPSY N/A 01/31/2017    Procedure: COLONOSCOPY FLEXIBLE WITH BIOPSY;  Surgeon: Vicci Emery DEL, MD   . COLONOSCOPY WITH BIOPSY N/A 05/29/2017    Procedure: COLONOSCOPY, WITH BIOPSY;  Surgeon: Josepha Marinell BROCKS, MD   . COLONOSCOPY WITH BIOPSY N/A 05/29/2017    Procedure: COLONOSCOPY, WITH BIOPSY;  Surgeon: Josepha Marinell BROCKS, MD   . EGD N/A 07/01/2013    Procedure: ESOPHAGOGASTRODUODENOSCOPY;  Surgeon: Neysa Katz, MD;  Location: [facility] ENDO OR LOC;  Service: Endoscopy GI;  Laterality: N/A;   . EGD N/A 08/08/2014    Procedure: ESOPHAGOGASTRODUODENOSCOPY FLEXIBLE TRANSORAL DIAGNOSTIC;  Surgeon: Glendia Copier, MD;  Location: [facility] ENDO OR LOC;  Service: Endoscopy GI;  Laterality: N/A;   . EGD WITH BIOPSY N/A 06/07/2016    Procedure: ESOPHAGOGASTRODUODENOSCOPY FLEXIBLE TRANSORAL WITH BIOPSY;  Surgeon: Vicci Emery DEL, MD   . EGD WITH BIOPSY N/A 03/20/2020    Procedure: EGD, WITH BIOPSY;  Surgeon: Josepha Marinell BROCKS, MD   . EGD WITH BLEEDING CONTROL N/A 05/29/2017    Procedure: EGD, WITH HEMORRHAGE CONTROL;  Surgeon: Josepha Marinell BROCKS, MD   . EXCISION OF BENIGN LESION Right 10/19/2021    Procedure: EXCISION, LESION, BENIGN, BREAST;  Surgeon: Clance Rosealee HERO, MD   . HEART CATHETERIZATION     . HYSTERECTOMY     . LOOP ELECTROSURGICAL EXCISON PROCEDURE     . LOOP RECORDER  2018   . OTHER Right 06/18/2021    axillary lymph node biopsy with clip placed   . OTHER  05/2021    Left leg ablation procedure   . VENOUS ACCESS CATHETER INSERTION Right 03/29/2019     Family History   Problem Relation Age of Onset   . Hypertension Mother    . Diabetes Mother    .  Hypertension Sister    . Diabetes Brother    . Hypertension Brother    . Other Family History Father         Unknown to patient   . Anesthesia Reaction  Neg Hx      Social History     Occupational History   . Not on file   Tobacco Use   . Smoking status: Never     Passive exposure: Never   . Smokeless tobacco: Never   Vaping Use   . Vaping status: Never Used   Substance and Sexual Activity   . Alcohol use: No     Alcohol/week: 0.0 standard drinks of alcohol   . Drug use: No   . Sexual activity: Not Currently       No outpatient medications have been marked as taking for the 08/27/24 encounter [facility] Encounter).     Allergies   Allergen Reactions   . Montelukast rash/itching and swelling   . Nitroglycerin hives and rash/itching   . Immune Globulin  (Human) (Igg) swelling and rash/itching     Pt complain of itchiness and swelling of lips and tongue. Saturation fine, bp elevated.   . Magnesium  Sulfate rash/itching     Can take magnesium  supplement   . Amoxicillin hives     Has tolerated ceftriaxone, cephalexin   . Aspirin  rash/itching     Patient states she was told to not take aspirin  d/t daily eliquis  use. Patient also states she itches all over with medication   . Atorvastatin  unknown and other/intolerance     Rhabdomyolysis   Pt states she thinks it caused her to have rhabdo   . Bumex [Bumetanide] muscle/joint pain   . Ceftriaxone rash/itching   . Magnesium  Chloride rash/itching   . Sulfamethoxazole-Trimethoprim rash/itching   . Penicillins hives     Has tolerated cephalexin   . Tetracyclines hives   . Iodinated Contrast Media hives     Updated per Dr. Lorn who confirmed with pt.    . Latex, Natural Rubber rash/itching and hives   . Kit Prep Of [id number] unknown           MDM/EDCOURSE    MDM Data  External documents reviewed: Previous admissions, ER visits, outpatient notes, nursing reports, imaging and labs if applicable   My EKG interpretation:        ED Course as of 08/27/24 2307   Tue Aug 27, 2024   [id number] 68 year old female presented with symptoms as above.  Story is concerning for high risk chest pain.  EKG does show sinus rhythm at a rate of 92 with  T wave inversions in the lateral leads consistent with hypokalemia.  Patient also found to be hypokalemic which we will replete.  Patient will be admitted for all the above [AT]     The services I provided to this patient were to treat and/or prevent clinically significant deterioration that could result in the failure of one or more body systems and/or organs.      Services included the following: chart data review, reviewing nursing notes and/or old charts, documentation time, consultant collaboration regarding findings and treatment options, medication orders and management, direct patient care, re-evaluations, vital sign assessments and ordering, interpreting and reviewing diagnostic studies/lab tests.      Aggregate critical care time was 35 minutes, which includes only time during which I was engaged in work directly related to the patient's care, as described above, whether at the bedside or elsewhere in the Emergency Department.  It did not include time spent performing other reported procedures or the services of residents, students, nurses or physician assistants.        PROCEDURES  Procedures    Vital Signs:  Patient Vitals for the past 72 hrs:   Temp Heart Rate Pulse Resp BP BP Mean SpO2 Weight   08/27/24 2215 -- 89 90 11 124/68 84 MM HG 97 % --   08/27/24 2145 -- 95 92 14 127/72 89 MM HG 99 % --   08/27/24 2119 98.6 F (37 C) 99 -- 18 125/75 92 MM HG 99 % 85.7 kg (189 lb)       Documentation Review:  Records review: Old medical records:       Diagnostics:  Labs:    Results for orders placed or performed during the hospital encounter of 08/27/24   COMPREHENSIVE METABOLIC PANEL   Result Value Ref Range    Potassium 2.6 (LL) 3.5 - 5.5 mmol/L    Sodium 140 133 - 145 mmol/L    Chloride 94 (L) 98 - 110 mmol/L    Glucose 98 70 - 99 mg/dL    Calcium  9.7 8.4 - 10.5 mg/dL    Albumin  4.3 3.5 - 5.0 g/dL    SGPT (ALT) 17 5 - 40 U/L    SGOT (AST) 31 10 - 37 U/L    Bilirubin Total 0.3 0.2 - 1.2 mg/dL    Alkaline  Phosphatase 112 40 - 120 U/L    BUN 12 6 - 22 mg/dL    CO2 27 20 - 32 mmol/L    Creatinine 1.5 (H) 0.8 - 1.4 mg/dL    eGFR 62.6 (L) >39.9 mL/min/1.73 sq.m.    Globulin 3.1 2.0 - 4.0 g/dL    A/G Ratio 1.4 1.1 - 2.6 ratio    Total Protein 7.4 6.2 - 8.1 g/dL    Anion Gap 80.9 (H) 3.0 - 15.0 mmol/L   TROPONIN   Result Value Ref Range    Troponin (T) Quant High Sensitivity (5th Gen) 7 0 - 19 ng/L   NT PROBNP   Result Value Ref Range    NT proBNP 73 <=125 pg/mL   CBC WITH DIFFERENTIAL AUTO   Result Value Ref Range    WBC 7.8 4.0 - 11.0 K/uL    RBC 4.51 3.80 - 5.20 M/uL    HGB 11.8 11.7 - 16.1 g/dL    HCT 62.2 64.8 - 51.6 %    MCV 84 80 - 99 fL    MCH 26 26 - 34 pg    MCHC 31 31 - 36 g/dL    RDW 85.5 89.9 - 84.4 %    Platelet 323 140 - 440 K/uL    MPV 10.6 9.0 - 13.0 fL    Segmented Neutrophils (Auto) 58 40 - 75 %    Lymphocytes (Auto) 30 20 - 45 %    Monocytes (Auto) 8 3 - 12 %    Eosinophils (Auto) 3 0 - 6 %    Basophils (Auto) 1 0 - 2 %    Absolute Neutrophils (Auto) 4.5 1.8 - 7.7 K/uL    Absolute Lymphocytes (Auto) 2.4 1.0 - 4.8 K/uL    Absolute Monocytes (Auto) 0.7 0.1 - 1.0 K/uL    Absolute Eosinophils (Auto) 0.2 0.0 - 0.5 K/uL    Absolute Basophils (Auto) 0.0 0.0 - 0.2 K/uL         CHEST PORTABLE - Chest Pain   Final Result   IMPRESSION:  No significant findings.      Electronically signed by: Maranda Rail, MD  Board Certified   Radiologist 08/27/2024 9:49 PM      EKG 12 LEAD UNIT PERFORMED   Final Result              Final diagnoses:   [E87.6] Hypokalemia (Primary)   [R07.9] Chest pain, unspecified type     New Prescriptions    No medications on file         .

## 2024-08-27 NOTE — H&P (Signed)
 [facility]  [employer] [facility]    History and Physical  08/27/2024 11:14 PM      THE HOSPITALIST TEAM PREFERS TO USE EPIC CHAT FOR COMMUNICATION 7AM-7PM. IF I DO NOT RESPOND WITHIN 15 MINUTES, PLEASE PAGE ME/CALL THROUGH THE OPERATOR. FROM 7PM-7AM, PLEASE PAGE.     Were Interpreter Services Utilized?  No    Assessment and Plan   [name] is an 68 y.o. female with past medical history of essential hypertension, heart failure with preserved ejection fraction paroxysmal atrial fibrillation, recurrent chest pain, left heart cath 07/2021 with normal coronaries CVA with no residual deficit, history of autoimmune myelitis, dyslipidemia who came from Home with With chief complaint of pressure-like chest pain radiating to the arms and back with associated shortness of breath.  Additionally noted that the chest pain is exertional. In the ED afebrile heart rate 99 respiration 18 blood pressure 125/75 99% on room air.  CBC differential unremarkable troponin 7 CMP potassium 2.7 anion gap 19 creatinine 1.5.  Chest x-ray unremarkable.  EKG with T wave inversions.   given normal saline bolus,potassium supplements.        #Chest pain present admission  left heart cath 07/2021 with normal coronaries   Initial troponin 7, repeat troponin  Patient voices nitrostat gives hives    -Telemonitoring  - Trend troponin  - Cardiology consult  -morphine  prn      #High anion gap metabolic acidosis anion gap 19 creatinine 1.5 baseline 1.0  #Acute kidney injury on CKD stage III (related to diabetes and HTN)  #Severe hypokalemia 2.6  The above is most likely related to torsemide Farxiga use    - Gentle fluid hydration  -hold home torsemide, metolazone   -Monitor and replete electrolytes      #Heart failure with preserved ejection fraction not in acute exacerbation  #Primary hypertension  07/31/2024 TTE EF 55%, grade 1 diastolic dysfunction, moderate aortic valve regurgitation    -Fluid restrict, weigh  patient daily, strict I's and O's  -Hold home torsemide,metolazone Farxiga given severe hypokalemia    #Paroxysmal atrial fibrillation, rate controlled     -Continue Toprol  25 mg daily  -Continue Eliquis  5 twice daily         #COPD not in acute exacerbation  -symbicort  bid  -Albuterol  as needed    #History of CVA with cerebral artery occlusion  Patient voices significant abdominal distress secondary to aspirin , 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 Kelton     -Continue mycophenolate  sodium  twice daily  -continue home Metaxalone      #Restless leg syndrome  -lyrica  every evening  -Continue ropinirole  0.5 mg 2 tabs nightly           #Epilepsy, controlled  -Lamotrigine  100 mg evening     #Bipolar  -Continue lurasidone  160 mg  -Continue Seroquel  50 mg nightly          #GERD  -On Protonix  40 mg twice daily.  Not on formulary here  -Omeprazole  twice daily while inpatient           #Type 2 diabetes without insulin  dependence  A1c 6.1-8 25  -Low-dose insulin  with meals  -Keep glucose 140-180     #Class I obesity due to excessive calories with serious comorbidity and body mass index of  51.45 in adult adult complicated by hypertension hyperlipidemia diabetes    - Recommend adequate diet and exercise     VTE Prophylaxis:   Eliquis     MEDICAL DECISION MAKING     Number and Complexity of Problems   Initial Problem(s) still undergoing workup/evaluation: Yes  New Problems Identified and Addressed this DOS(@DOS @):   Changes/Updates to Differential Diagnosis this DOS: Yes     Communication  Management/test interpretation discussed with external clinician/appropriate source this DOS (@DOS @): Yes  External Clinician:   Shared decision making with patient/family this DOS: Yes     Orders this Encounter  Labs/imaging ordered this DOS: Yes  Updates/Changes to  Drug Management this DOS: Yes   Medication(s) ordered/changed/continued/considered this DOS: Yes      Chief Complaint      Chief Complaint   Patient presents with   . CHEST PAIN (ADULT)        History of Present Illness   [name] is an 68 y.o. female with past medical history of essential hypertension, heart failure with preserved ejection fraction paroxysmal atrial fibrillation, recurrent chest pain, left heart cath 07/2021 with normal coronaries CVA with no residual deficit, history of autoimmune myelitis, dyslipidemia who came from Home with With chief complaint of pressure-like chest pain radiating to the arms and back with associated shortness of breath.  Additionally noted that the chest pain is exertional. In the ED afebrile heart rate 99 respiration 18 blood pressure 125/75 99% on room air.  CBC differential unremarkable troponin 7 CMP potassium 2.7 anion gap 19 creatinine 1.5.  Chest x-ray unremarkable.  EKG with T wave inversions.   given normal saline bolus,potassium supplements.  Past Medical History     Past Medical History:   Diagnosis Date   . Acute, but ill-defined, cerebrovascular disease     TIA   . Anxiety    . Arthropathy     knees   . Asthma    . Bipolar disorder (HCC)    . Cardiac arrhythmia     h/o Afib on Eliquis    . Cerebral artery occlusion with cerebral infarction [facility])     CVA post endodartorectomy   . Chest pain     has history multiple admissions for this, stress test and cardiac cath negative   . Chronic anemia     Hgb ~10   . Chronic obstructive pulmonary disease (HCC) 01/19/2017   . COPD (chronic obstructive pulmonary disease) (HCC)    . Cough    . COVID    . COVID-19 vaccine series completed    . CVA (cerebral vascular accident) (HCC) 09/01/2021   . Deficiency anemia 11/2020    rx with Iron Infusions   . Depression    . Epilepsy (HCC)     per pt last Sz was approx 9-10 months ago   . Esophageal reflux    . Headache    . Hemiplegia and hemiparesis following cerebral infarction  affecting left non-dominant side (HCC) 01/22/2020   . Hemorrhoids    . History of blood transfusion     following one of her pregnancy   . History of cardiac catheterization no obstructive cad in 2017    . Hx: UTI (urinary tract infection)    . Hyperlipidemia    . Hyperlipidemia    . Hypertension    . Leg pain    . Lower leg edema    . Migraines    . Muscle atrophy    . Paroxysmal atrial fibrillation (  HCC)     on anticoagulation   . Pregnant state, incidental    . Seizure-like activity (HCC)     EEG negative   . Stable angina pectoris 02/15/2018   . Stiff person syndrome    . Syncope     recurrent, has loop recorder   . Thoracic aortic aneurysm    . TIA (transient ischemic attack)     multiple since age 89's per patient. Multiple MRI brain in the past negative for evidence of stroke. Suspected complicated migraine.   . Type 2 diabetes mellitus (HCC)     not on medication   . Unable to coordinate sucking, swallowing, and breathing 09/28/2019       Past Surgical History     Past Surgical History:   Procedure Laterality Date   . BREAST BIOPSY Right 06/18/2021    right ultrasound core biopsy with clip placed   . CARDIAC CATH  2016, 02/2016   . CAROTID ENDARTERECTOMY     . COLONOSCOPY N/A 07/01/2013    Procedure: COLONOSCOPY;  Surgeon: Neysa Katz, MD;  Location: [facility] ENDO OR LOC;  Service: Endoscopy GI;  Laterality: N/A;   . COLONOSCOPY N/A 04/13/2015    Procedure: COLONOSCOPY FLEXIBLE;  Surgeon: Glendia Sherry, MD;  Location: [facility] ENDO OR LOC;  Service: Endoscopy GI;  Laterality: N/A;   . COLONOSCOPY WITH BIOPSY N/A 04/13/2015    Procedure: COLONOSCOPY FLEXIBLE WITH BIOPSY;  Surgeon: Glendia Sherry, MD;  Location: [facility] ENDO OR LOC;  Service: Endoscopy GI;  Laterality: N/A;   . COLONOSCOPY WITH BIOPSY N/A 01/31/2017    Procedure: COLONOSCOPY FLEXIBLE WITH BIOPSY;  Surgeon: Vicci Emery DEL, MD   . COLONOSCOPY WITH BIOPSY N/A 05/29/2017    Procedure: COLONOSCOPY, WITH BIOPSY;  Surgeon: Josepha Marinell BROCKS, MD    . COLONOSCOPY WITH BIOPSY N/A 05/29/2017    Procedure: COLONOSCOPY, WITH BIOPSY;  Surgeon: Josepha Marinell BROCKS, MD   . EGD N/A 07/01/2013    Procedure: ESOPHAGOGASTRODUODENOSCOPY;  Surgeon: Neysa Katz, MD;  Location: [facility] ENDO OR LOC;  Service: Endoscopy GI;  Laterality: N/A;   . EGD N/A 08/08/2014    Procedure: ESOPHAGOGASTRODUODENOSCOPY FLEXIBLE TRANSORAL DIAGNOSTIC;  Surgeon: Glendia Copier, MD;  Location: [facility] ENDO OR LOC;  Service: Endoscopy GI;  Laterality: N/A;   . EGD WITH BIOPSY N/A 06/07/2016    Procedure: ESOPHAGOGASTRODUODENOSCOPY FLEXIBLE TRANSORAL WITH BIOPSY;  Surgeon: Vicci Emery DEL, MD   . EGD WITH BIOPSY N/A 03/20/2020    Procedure: EGD, WITH BIOPSY;  Surgeon: Josepha Marinell BROCKS, MD   . EGD WITH BLEEDING CONTROL N/A 05/29/2017    Procedure: EGD, WITH HEMORRHAGE CONTROL;  Surgeon: Josepha Marinell BROCKS, MD   . EXCISION OF BENIGN LESION Right 10/19/2021    Procedure: EXCISION, LESION, BENIGN, BREAST;  Surgeon: Clance Rosealee HERO, MD   . HEART CATHETERIZATION     . HYSTERECTOMY     . LOOP ELECTROSURGICAL EXCISON PROCEDURE     . LOOP RECORDER  2018   . OTHER Right 06/18/2021    axillary lymph node biopsy with clip placed   . OTHER  05/2021    Left leg ablation procedure   . VENOUS ACCESS CATHETER INSERTION Right 03/29/2019       Social History     Social History     Socioeconomic History   . Marital status: Divorced     Spouse name: Not on file   . Number of children: Not on file   . Years of education: Not on file   .  Highest education level: Not on file   Occupational History   . Not on file   Tobacco Use   . Smoking status: Never     Passive exposure: Never   . Smokeless tobacco: Never   Vaping Use   . Vaping status: Never Used   Substance and Sexual Activity   . Alcohol use: No     Alcohol/week: 0.0 standard drinks of alcohol   . Drug use: No   . Sexual activity: Not Currently   Other Topics Concern   . Back Care Not Asked   . Bike Helmet Not Asked   . Blood Transfusions Not Asked   . Caffeine  Concern Not  Asked   . Counseling Not Asked   . Depression Concerns Not Asked   . Depression Screening Not Asked   . Exercise Not Asked   . Hobby Hazards Not Asked   . Military Service Not Asked   . Occupational Exposure Not Asked   . Seat Belt Not Asked   . Self-Exams Not Asked   . Sleep Concern Not Asked   . Smoke Detectors Not Asked   . Smoking Concerns Not Asked   . Smoking Cessation Not Asked   . Special Diet Not Asked   . Stress Concern Not Asked   . Weight Concern Not Asked   . Do you use a bike helmet? Not Asked   . Do you use caffeine  daily? Not Asked   . Do you exercise? Not Asked   . Are there hazards related to your hobbies? Not Asked   . Military Service Not Asked   . Do you have any work place hazards? Not Asked   . Do you wear a seat belt while in a moving vehicle? Not Asked   . Do you have sleep concerns? Not Asked   . Do you follow a special diet? Not Asked   . Are you concerned with your weight? Not Asked   Social History Narrative    DNR/DNI    MPOA - her daughter Warrick Silvius     Social Drivers of Health     Financial Resource Strain: Medium Risk (06/28/2024)    Overall Financial Resource Strain (CARDIA)    . Difficulty of Paying Living Expenses: Somewhat hard   Food Insecurity: No Food Insecurity (06/28/2024)    Hunger Vital Sign    . Worried About Programme Researcher, Broadcasting/film/video in the Last Year: Never true    . Ran Out of Food in the Last Year: Never true   Transportation Needs: Unmet Transportation Needs (06/28/2024)    PRAPARE - Transportation    . Lack of Transportation (Medical): Yes    . Lack of Transportation (Non-Medical): Yes   Physical Activity: Unknown (06/28/2024)    Exercise Vital Sign    . Days of Exercise per Week: Patient declined    . Minutes of Exercise per Session: 20 min   Stress: No Stress Concern Present (06/28/2024)    [employer] of Occupational Health - Occupational Stress Questionnaire    . Feeling of Stress : Not at all   Social Connections: Moderately Integrated (06/28/2024)    Social Connection and  Isolation Panel    . Frequency of Communication with Friends and Family: Three times a week    . Frequency of Social Gatherings with Friends and Family: Twice a week    . Attends Religious Services: 1 to 4 times per year    . Active Member of Clubs or  Organizations: Patient declined    . Attends Banker Meetings: 1 to 4 times per year    . Marital Status: Divorced   Catering Manager Violence: Not At Risk (06/28/2024)    Humiliation, Afraid, Rape, and Kick questionnaire    . Fear of Current or Ex-Partner: No    . Emotionally Abused: No    . Physically Abused: No    . Sexually Abused: No   Housing Stability: Low Risk (06/28/2024)    Housing Stability Vital Sign    . Unable to Pay for Housing in the Last Year: No    . Number of Times Moved in the Last Year: 1    . Homeless in the Last Year: No        Family History     Family History   Problem Relation Age of Onset   . Hypertension Mother    . Diabetes Mother    . Hypertension Sister    . Diabetes Brother    . Hypertension Brother    . Other Family History Father         Unknown to patient   . Anesthesia Reaction Neg Hx        Allergies     Allergies   Allergen Reactions   . Montelukast rash/itching and swelling   . Nitroglycerin hives and rash/itching   . Immune Globulin  (Human) (Igg) swelling and rash/itching     Pt complain of itchiness and swelling of lips and tongue. Saturation fine, bp elevated.   . Magnesium  Sulfate rash/itching     Can take magnesium  supplement   . Amoxicillin hives     Has tolerated ceftriaxone, cephalexin   . Aspirin  rash/itching     Patient states she was told to not take aspirin  d/t daily eliquis  use. Patient also states she itches all over with medication   . Atorvastatin  unknown and other/intolerance     Rhabdomyolysis   Pt states she thinks it caused her to have rhabdo   . Bumex [Bumetanide] muscle/joint pain   . Ceftriaxone rash/itching   . Magnesium  Chloride rash/itching   . Sulfamethoxazole-Trimethoprim rash/itching   .  Penicillins hives     Has tolerated cephalexin   . Tetracyclines hives   . Iodinated Contrast Media hives     Updated per Dr. Lorn who confirmed with pt.    . Latex, Natural Rubber rash/itching and hives   . Kit Prep Of Tc-33m-Tetrofosmin unknown        Prior to Admission Medications     Prior to Admission Medications   Prescriptions Last Dose Patient Reported? Taking?   Ammonium Lactate 12 % Top CREA  Yes No   Sig: Use 1 Application to affected area Daily as needed (feet).   Magnesium  Glycinate 100 mg PO TABS  Yes No   Sig: Take 100 mg by Mouth Once a Day. Indications: patient states she takes this over the counter   Mycophenolate  Sodium 360 mg PO TBEC  Yes No   Sig: Take 2 Tabs by Mouth Twice Daily.   QUEtiapine  (SEROQUEL ) 25 mg PO TABS  Yes No   Sig: Take 1 Tab by Mouth nightly as needed.   QUEtiapine  50 mg PO TABS  Yes No   Sig: Take 1 Tab by Mouth Every Night at Bedtime.   albuterol  (PROVENTIL , VENTOLIN , PROAIR ) 90 mcg/actuation INH HFAA inhaler  Yes No   Sig: Take 2 Puffs inhaled by mouth Every 4 Hours As Needed.   apixaban  (ELIQUIS )  5 mg PO TABS  Yes No   Sig: Take 1 Tab by Mouth Twice Daily.   budesonide -formoteroL  (SYMBICORT ) 80-4.5 mcg/actuation INH HFAA  Yes No   Sig: Take 2 Puffs inhaled by mouth 2 (two) times a day.   cloNIDine HCL (CATAPRES) 0.1 mg PO TABS  No No   Sig: Take 0.5 Tabs by Mouth Twice Daily.   dapagliflozin propanediol (FARXIGA) 10 mg PO TABS  No No   Sig: Take 1 Tab by Mouth Once a Day.   ezetimibe (ZETIA) 10 mg PO TABS  Yes No   Sig: Take 1 Tab by Mouth Once a Day.   lamoTRIgine  (LAMICTAL ) 100 mg PO TABS  No No   Sig: Take 1 Tab by Mouth Once a Day. Indications: epilepsy   lidocaine  (LIDODERM ) 5 % Top PtMd  No No   Sig: Apply 1 patch as directed for 12 hours every 24 hours (12 hours on, 12 hours off)   lurasidone  (LATUDA ) 40 mg PO TABS  Yes No   Sig: Take 4 Tabs by Mouth Every Night at Bedtime.   Note (07/31/2024): Takes 120+40 Total 160mg     metOLazone (ZAROXOLYN) 5 mg PO TABS  Yes No    Sig: Take 1 Tab by Mouth Once a Day.   metaxalone  (SKELAXIN ) 800 mg PO TABS  Yes No   Sig: Take 1 Tab by Mouth 4 Times Daily.   metoprolol  XL (TOPROL  XL) 25 mg PO TB24  Yes No   Sig: Take 0.5 Tabs by Mouth Once a Day.   pantoprazole  (PROTONIX ) 40 mg PO TBEC  No No   Sig: TAKE 1 TABLET BY MOUTH TWICE A DAY   pregabalin  (LYRICA ) 100 mg PO CAPS  Yes No   Sig: Take 1 Cap by Mouth every evening.   rOPINIRole  (REQUIP ) 0.5 mg PO TABS  No No   Sig: Take 1 Tab by Mouth Every Night at Bedtime.   topiramate  (TOPAMAX ) 25 mg PO TABS  No No   Sig: TAKE 2 TABLETS BY MOUTH TWICE A DAY   torsemide (DEMADEX) 20 mg PO TABS  No No   Sig: Take 1 Tab by Mouth Twice Daily.   traMADoL  (ULTRAM ) 50 mg PO TABS  Yes No   Sig: Take 1 Tab by Mouth Every 6 Hours As Needed for Moderate Pain (Pain Score 4-6).      Facility-Administered Medications: None         Review of System and Physical Exam      BP: 118/71  Heart Rate: 86  Pulse: 90  Temp: 98.6 F (37 C)  Resp: 11  Height: 5' 5 (165.1 cm)  Weight: 85.7 kg (189 lb)  BMI (Calculated): 31.45  SpO2: 97 %    Temp (24hrs), Avg:98.6 F (37 C), Min:98.6 F (37 C), Max:98.6 F (37 C)            Physical Exam  Vitals and nursing note reviewed.   Constitutional:       Appearance: Normal appearance.   HENT:      Head: Normocephalic and atraumatic.   Eyes:      General:         Right eye: No discharge.         Left eye: No discharge.   Cardiovascular:      Rate and Rhythm: Normal rate and regular rhythm.      Pulses: Normal pulses.      Heart sounds: Normal heart sounds.   Pulmonary:  Effort: Pulmonary effort is normal. No respiratory distress.      Breath sounds: Normal breath sounds. No wheezing.   Abdominal:      General: There is no distension.      Palpations: Abdomen is soft. There is no mass.      Tenderness: There is no abdominal tenderness.      Hernia: No hernia is present.   Musculoskeletal:      Cervical back: Normal range of motion and neck supple.      Right lower leg: No edema.       Left lower leg: No edema.   Skin:     General: Skin is warm.      Capillary Refill: Capillary refill takes less than 2 seconds.   Neurological:      General: No focal deficit present.      Mental Status: She is alert and oriented to person, place, and time.   Psychiatric:         Mood and Affect: Mood normal.         Behavior: Behavior normal.                       Review of Systems   All other systems reviewed and are negative.         Problem List     Patient Active Problem List   Diagnosis   . Essential hypertension   . Paroxysmal atrial fibrillation (HCC)   . Bipolar 1 disorder (HCC)   . Iron deficiency anemia due to chronic blood loss   . Hypokalemia   . Pure hypercholesterolemia   . Gastroesophageal reflux disease without esophagitis   . Chronic headache   . History of nuclear stress test in 05/2108   . History of cardiac catheterization no obstructive cad in 2017    . Transient neurologic deficit   . TIA (transient ischemic attack)   . Abnormal EKG   . Vertigo   . Slurred speech   . Headache   . Weakness of left upper extremity   . Progressive focal motor weakness   . History of cardiac cath no obstructive cad in cath in 2014 and 2017   . Hx of TIA (transient ischemic attack) and stroke 08/2016   . Right sided weakness   . Left-sided weakness   . Seizure (HCC)   . Chronic anemia   . Bleeding per rectum   . Syncope   . Iron deficiency anemia, unspecified   . Migraine   . Facial droop   . Left hand paresthesia   . Fall   . Unable to coordinate sucking, swallowing, and breathing   . Left sided numbness   . Chronic diastolic (congestive) heart failure (HCC)   . Acute focal neurological deficit   . Intractable headache   . Facial paresthesia   . Non-traumatic rhabdomyolysis   . Paresthesia   . Acute encephalopathy   . Acute on chronic anemia   . UTI (urinary tract infection)   . Suprapubic catheter dysfunction, initial encounter   . Malfunction of indwelling urinary catheter   . Syncope and collapse   .  Neuropathy   . Neurogenic bladder   . Diastolic heart failure (HCC)   . Vitamin B6 induced neuropathy   . Chest pain   . Statin myopathy   . Left arm numbness   . Chest pain, unspecified type        -----  Focus of this inpatient  stay will remain on problems that need acute care setting for care.      We will review available studies and will order additional labs, imaging and other studies as appropriate.     As needed medicines are ordered as appropriate.     VTE Prophylaxis will be ordered as appropriate.     Please see above for management plan for individual hospital problems.     Home medications are reviewed and will be continued as appropriate.     Patient will be continued to be followed during this hospital stay by a member of [facility].        Labs     Results for orders placed or performed during the hospital encounter of 08/27/24 (from the past 24 hours)   COMPREHENSIVE METABOLIC PANEL   Result Value Ref Range    Potassium 2.6 (LL) 3.5 - 5.5 mmol/L    Sodium 140 133 - 145 mmol/L    Chloride 94 (L) 98 - 110 mmol/L    Glucose 98 70 - 99 mg/dL    Calcium  9.7 8.4 - 10.5 mg/dL    Albumin  4.3 3.5 - 5.0 g/dL    SGPT (ALT) 17 5 - 40 U/L    SGOT (AST) 31 10 - 37 U/L    Bilirubin Total 0.3 0.2 - 1.2 mg/dL    Alkaline Phosphatase 112 40 - 120 U/L    BUN 12 6 - 22 mg/dL    CO2 27 20 - 32 mmol/L    Creatinine 1.5 (H) 0.8 - 1.4 mg/dL    eGFR 62.6 (L) >39.9 mL/min/1.73 sq.m.    Globulin 3.1 2.0 - 4.0 g/dL    A/G Ratio 1.4 1.1 - 2.6 ratio    Total Protein 7.4 6.2 - 8.1 g/dL    Anion Gap 80.9 (H) 3.0 - 15.0 mmol/L   TROPONIN   Result Value Ref Range    Troponin (T) Quant High Sensitivity (5th Gen) 7 0 - 19 ng/L   NT PROBNP   Result Value Ref Range    NT proBNP 73 <=125 pg/mL   CBC WITH DIFFERENTIAL AUTO   Result Value Ref Range    WBC 7.8 4.0 - 11.0 K/uL    RBC 4.51 3.80 - 5.20 M/uL    HGB 11.8 11.7 - 16.1 g/dL    HCT 62.2 64.8 - 51.6 %    MCV 84 80 - 99 fL    MCH 26 26 - 34 pg    MCHC 31 31 - 36  g/dL    RDW 85.5 89.9 - 84.4 %    Platelet 323 140 - 440 K/uL    MPV 10.6 9.0 - 13.0 fL    Segmented Neutrophils (Auto) 58 40 - 75 %    Lymphocytes (Auto) 30 20 - 45 %    Monocytes (Auto) 8 3 - 12 %    Eosinophils (Auto) 3 0 - 6 %    Basophils (Auto) 1 0 - 2 %    Absolute Neutrophils (Auto) 4.5 1.8 - 7.7 K/uL    Absolute Lymphocytes (Auto) 2.4 1.0 - 4.8 K/uL    Absolute Monocytes (Auto) 0.7 0.1 - 1.0 K/uL    Absolute Eosinophils (Auto) 0.2 0.0 - 0.5 K/uL    Absolute Basophils (Auto) 0.0 0.0 - 0.2 K/uL         Imaging     Recent Results (from the past 36 hours)   1. CHEST PORTABLE - Chest Pain  Narrative    HISTORY:  CHEST PAIN    EXAM:  CHEST 1 VIEW    COMPARISONS:  Chest x-ray July 31, 2024.    FINDINGS:  Cardiomediastinal silhouette is normal. The lungs are clear with no  opacity, effusion, or pneumothorax. No acute bony abnormality  visualized.      Impression    IMPRESSION:  No significant findings.    Electronically signed by: Maranda Rail, MD  Board Certified  Radiologist 08/27/2024 9:49 PM   2. EKG 12 LEAD UNIT PERFORMED   Result Value Ref Range    Heart Rate 92 bpm    RR Interval 652 ms    Atrial Rate 92 ms    P-R Interval 172 ms    P Duration 138 ms    P Horizontal Axis -11 deg    P Front Axis 46 deg    Q Onset 499 ms    QRSD Interval 92 ms    QT Interval 374 ms    QTcB 463 ms    QTcF 431 ms    QRS Horizontal Axis -26 deg    QRS Axis 24 deg    I-40 Front Axis 19 deg    t-40 Horizontal Axis -34 deg    T-40 Front Axis 24 deg    T Horizontal Axis 170 deg    T Wave Axis 213 deg    S-T Horizontal Axis 164 deg    S-T Front Axis 201 deg    Impression - ABNORMAL ECG -     Impression SR-Sinus rhythm-normal P axis, V-rate 50-99     Impression       PLAE-Probable  left atrial enlargement-P >68mS, <-0.42mV V1    Impression       -Repol abnrm suggests ischemia, diffuse leads, new since 07/31/2024, needing   close follow up-          Signed   Consuelo Sieving, MD    Hospital Medicine  08/27/2024  11:14 PM     Voice  recognition dictation software has been used to generate this document.  In doing so, there is potential for error.  Examples of errors include spelling, punctuation, formatting, omission of words, insertion of words, incorrect words.  I have reviewed the document for accuracy.  Should there be concern for potential error that may impact treatment or billing, please reach out to me directly for clarification.

## 2024-08-28 ENCOUNTER — Ambulatory Visit: Payer: Self-pay

## 2024-08-28 NOTE — Discharge Summary (Addendum)
 [facility]  [place] [facility]       Discharge Summary   08/28/2024 11:43 AM    Admission Date: 08/27/2024   Discharge Date: 08/28/2024    Were Interpreter Services Utilized?  No     Final Discharge Diagnosis:  ##Chest pain likely noncardiac in nature, with flat troponin, followed by cardiology, further workup as an outpatient,  #Chronic HFpEF, clinically compensated,  Paroxysmal atrial fibrillation in sinus rhythm, on Eliquis ,  # Hypokalemia, replaced, patient be prescribed potassium supplementation and advised to repeat BMP in 1 week PCP for further adjustment,  #Essential hypertension  #CVA history  History of dyslipidemia  COPD, stable,  #Autoimmune myelitis continue to follow with neurology as an outpatient,  #Restless leg syndrome, in remission,  #Epilepsy history, stable on lamotrigine ,  #Diabetes mellitus 2,  #Class I obesity,         Admission Diagnosis:  (E87.6) Hypokalemia    (R07.9) Chest pain, unspecified type    (G45.9) TIA (transient ischemic attack) - Plan: cloNIDine HCL (CATAPRES) 0.1 mg PO TABS    (I50.32) Chronic diastolic (congestive) heart failure (HCC) - Plan: Referral to Cardiac Rehab - Systolic CHF, Referral to Cardiac Rehabilitation      Hospital course:  68 y.o. female with past medical history of essential hypertension, heart failure with preserved ejection fraction paroxysmal atrial fibrillation, recurrent chest pain, left heart cath 07/2021 with normal coronaries CVA with no residual deficit, history of autoimmune myelitis, dyslipidemia who came from Home with With chief complaint of pressure-like chest pain radiating to the arms and back with associated shortness of breath.  Additionally noted that the chest pain is exertional. In the ED afebrile heart rate 99 respiration 18 blood pressure 125/75 99% on room air.  CBC differential unremarkable troponin 7 CMP potassium 2.7 anion gap 19 creatinine 1.5.  Chest x-ray unremarkable.      Will admit  cardiology,Troponin within normal range, and patient has had left heart cath in October 2022 with normal coronary anatomy,  Patient advised to follow with cardiology for outpatient ischemic evaluation, presently stable for discharge planning.  Follow-up with the PCP in 1 week postdischarge,  Echocardiogram reviewed normal ejection fraction, cleared for discharge from cardiology standpoint for outpatient follow-up.  Plan discussed with the patient to replace potassium aggressively and repeat if it is greater than 3 patient will be discharged home with further potassium supplementation with follow-up repeat BMP in 1 week with PCP.  Patient understands.  Plan discussed in the presence of nurse.       Dear [name], MD   [name] is advised to follow up with you within 1-2 weeks.     Follow-up with: Medicine     Scheduled appointments:  Follow Up Info (next 45 days)       Follow up with Pouch Waynard, MD in 1 week    Specialty: Family Practice    Phone: [phone]    Where: [facility]      Friday Aug 30, 2024    My Health Care Coordinator Outreach Visit with Delores Aubry HERO, RN at  9:15 AM    Where: [facility])      Monday Sep 02, 2024    Prediabetes Follow Up with Interpreter with [facility] ALL[facility] FARROW at  1:00 PM    Where: [facility] ([facility])      MyHealth Care Coordination  [facility]  [address]  [place] [address]  [phone] [facility]  [facility]  [address]  [place] [address]  [phone]                 Discharge Medication List        CONTINUE taking these medications      albuterol  90 mcg/actuation Hfaa inhaler  Take 2 Puffs inhaled by mouth Every 4 Hours As Needed.  Refills: 0  Commonly known as: ProventiL , Ventolin , ProAir       Ammonium  Lactate 12 % Crea  Use 1 Application to affected area Daily as needed (feet).  Refills: 0      apixaban  5 mg Tabs  Take 1 Tab by Mouth Twice Daily.  Refills: 0  Commonly known as: Eliquis       budesonide -formoteroL  80-4.5 mcg/actuation Hfaa  Take 2 Puffs inhaled by mouth 2 (two) times a day.  Refills: 0  Commonly known as: Symbicort       cloNIDine HCL 0.1 mg Tabs  Take 0.5 Tabs by Mouth Twice Daily.  Dispense: 90 Tab  Refills: 1  Commonly known as: Catapres      dapagliflozin propanediol 10 mg Tabs  Take 1 Tab by Mouth Once a Day.  Dispense: 30 Tab  Refills: 0  Commonly known as: Farxiga      ezetimibe 10 mg Tabs  Take 1 Tab by Mouth Once a Day.  Refills: 0  Commonly known as: Zetia      lamoTRIgine  100 mg Tabs  Take 1 Tab by Mouth Once a Day. Indications: epilepsy  Dispense: 30 Tab  Refills: 0  Commonly known as: LaMICtal       lurasidone  40 mg Tabs  Take 4 Tabs by Mouth Every Night at Bedtime.  Refills: 0  Commonly known as: Latuda       metOLazone 5 mg Tabs  Take 1 Tab by Mouth Once a Day.  Refills: 0  Commonly known as: Zaroxolyn      metoprolol  XL 25 mg Tb24  Take 0.5 Tabs by Mouth Once a Day.  Refills: 0  Commonly known as: Toprol  XL      Mycophenolate  Sodium 360 mg Tbec  Take 2 Tabs by Mouth Twice Daily.  Refills: 0      pantoprazole  40 mg Tbec  TAKE 1 TABLET BY MOUTH TWICE A DAY  Dispense: 180 Tab  Refills: 1  Commonly known as: Protonix       pregabalin  100 mg Caps  Take 1 Cap by Mouth every evening.  Refills: 0  Commonly known as: Lyrica       * QUEtiapine  50 mg Tabs  Take 1 Tab by Mouth Every Night at Bedtime.  Refills: 0      * QUEtiapine  25 mg Tabs  Take 1 Tab by Mouth nightly as needed.  Refills: 0  Commonly known as: SEROquel       rOPINIRole  0.5 mg Tabs  Take 1 Tab by Mouth Every Night at Bedtime.  Refills: 0  Commonly known as: Requip       topiramate  25 mg Tabs  TAKE 2 TABLETS BY MOUTH TWICE A DAY  Dispense: 180 Tab  Refills: 0  Commonly known as: Topamax       torsemide 20 mg Tabs  Take 1 Tab by Mouth  Twice Daily.  Refills: 0  Commonly known as: Demadex      traMADoL  50 mg Tabs  Take 1 Tab  by Mouth Every 6 Hours As Needed for Moderate Pain (Pain Score 4-6).  Refills: 0  Commonly known as: Ultram            * * This list has 2 medication(s) that are the same as other medications prescribed for you. Read the directions carefully, and ask your doctor or other care provider to review them with you.                    Allergies   Allergen Reactions   . Montelukast rash/itching and swelling   . Nitroglycerin hives and rash/itching   . Immune Globulin  (Human) (Igg) swelling and rash/itching     Pt complain of itchiness and swelling of lips and tongue. Saturation fine, bp elevated.   . Magnesium  Sulfate rash/itching     Can take magnesium  supplement   . Amoxicillin hives     Has tolerated ceftriaxone, cephalexin   . Aspirin  rash/itching     Patient states she was told to not take aspirin  d/t daily eliquis  use. Patient also states she itches all over with medication   . Atorvastatin  unknown and other/intolerance     Rhabdomyolysis   Pt states she thinks it caused her to have rhabdo   . Bumex [Bumetanide] muscle/joint pain   . Ceftriaxone rash/itching   . Magnesium  Chloride rash/itching   . Sulfamethoxazole-Trimethoprim rash/itching   . Penicillins hives     Has tolerated cephalexin   . Tetracyclines hives   . Iodinated Contrast Media hives     Updated per Dr. Lorn who confirmed with pt.    . Latex, Natural Rubber rash/itching and hives   . Kit Prep Of Tc-36m-Tetrofosmin unknown         Disposition: Home     Discharge Condition: Fair    Diet at the time of discharge: Cardiac Diet     Discharge Exam:   General alert and oriented x3 , Cardiology normal rate regular rhythm  , and Lungs: clear to ausulatation, no wheeze, rales or rhonchi, systemic air entry      Total time for discharge - review of data, exam, discussion with providers and care-team, med-rec and orders, arranging follow up, counseling of patient and/or family  and documentation was 17              Code Status Information       Code Status    FULL                Recent Results (from the past 72 hours)   COMPREHENSIVE METABOLIC PANEL    Collection Time: 08/27/24  9:00 PM   Result Value Ref Range    Potassium 2.6 (LL) 3.5 - 5.5 mmol/L    Sodium 140 133 - 145 mmol/L    Chloride 94 (L) 98 - 110 mmol/L    Glucose 98 70 - 99 mg/dL    Calcium  9.7 8.4 - 10.5 mg/dL    Albumin  4.3 3.5 - 5.0 g/dL    SGPT (ALT) 17 5 - 40 U/L    SGOT (AST) 31 10 - 37 U/L    Bilirubin Total 0.3 0.2 - 1.2 mg/dL    Alkaline Phosphatase 112 40 - 120 U/L    BUN 12 6 - 22 mg/dL    CO2 27 20 - 32 mmol/L    Creatinine 1.5 (H) 0.8 - 1.4 mg/dL    eGFR 62.6 (L) >39.9 mL/min/1.73 sq.m.    Globulin 3.1 2.0 - 4.0 g/dL  A/G Ratio 1.4 1.1 - 2.6 ratio    Total Protein 7.4 6.2 - 8.1 g/dL    Anion Gap 80.9 (H) 3.0 - 15.0 mmol/L   TROPONIN    Collection Time: 08/27/24  9:00 PM   Result Value Ref Range    Troponin (T) Quant High Sensitivity (5th Gen) 7 0 - 19 ng/L   NT PROBNP    Collection Time: 08/27/24  9:00 PM   Result Value Ref Range    NT proBNP 73 <=125 pg/mL   CBC WITH DIFFERENTIAL AUTO    Collection Time: 08/27/24  9:00 PM   Result Value Ref Range    WBC 7.8 4.0 - 11.0 K/uL    RBC 4.51 3.80 - 5.20 M/uL    HGB 11.8 11.7 - 16.1 g/dL    HCT 62.2 64.8 - 51.6 %    MCV 84 80 - 99 fL    MCH 26 26 - 34 pg    MCHC 31 31 - 36 g/dL    RDW 85.5 89.9 - 84.4 %    Platelet 323 140 - 440 K/uL    MPV 10.6 9.0 - 13.0 fL    Segmented Neutrophils (Auto) 58 40 - 75 %    Lymphocytes (Auto) 30 20 - 45 %    Monocytes (Auto) 8 3 - 12 %    Eosinophils (Auto) 3 0 - 6 %    Basophils (Auto) 1 0 - 2 %    Absolute Neutrophils (Auto) 4.5 1.8 - 7.7 K/uL    Absolute Lymphocytes (Auto) 2.4 1.0 - 4.8 K/uL    Absolute Monocytes (Auto) 0.7 0.1 - 1.0 K/uL    Absolute Eosinophils (Auto) 0.2 0.0 - 0.5 K/uL    Absolute Basophils (Auto) 0.0 0.0 - 0.2 K/uL   MAGNESIUM  SERUM    Collection Time: 08/27/24 10:04 PM   Result Value Ref Range    Magnesium  1.9 1.6  - 2.5 mg/dL   GLUCOSE SCREEN    Collection Time: 08/28/24 12:42 AM   Result Value Ref Range    Glucose POC 116 (H) 70 - 99 mg/dL   Troponin    Collection Time: 08/28/24  1:11 AM   Result Value Ref Range    Troponin (T) Quant High Sensitivity (5th Gen) 6 0 - 19 ng/L   GLUCOSE SCREEN    Collection Time: 08/28/24  8:02 AM   Result Value Ref Range    Glucose POC 99 70 - 99 mg/dL   CBC WITH DIFFERENTIAL AUTO    Collection Time: 08/28/24 11:25 AM   Result Value Ref Range    WBC 6.6 4.0 - 11.0 K/uL    RBC 4.13 3.80 - 5.20 M/uL    HGB 10.7 (L) 11.7 - 16.1 g/dL    HCT 65.7 (L) 64.8 - 48.3 %    MCV 83 80 - 99 fL    MCH 26 26 - 34 pg    MCHC 31 31 - 36 g/dL    RDW 85.8 89.9 - 84.4 %    Platelet 308 140 - 440 K/uL    MPV 10.2 9.0 - 13.0 fL    Segmented Neutrophils (Auto) 47 40 - 75 %    Lymphocytes (Auto) 39 20 - 45 %    Monocytes (Auto) 10 3 - 12 %    Eosinophils (Auto) 3 0 - 6 %    Basophils (Auto) 1 0 - 2 %    Absolute Neutrophils (Auto) 3.1 1.8 - 7.7 K/uL  Absolute Lymphocytes (Auto) 2.5 1.0 - 4.8 K/uL    Absolute Monocytes (Auto) 0.7 0.1 - 1.0 K/uL    Absolute Eosinophils (Auto) 0.2 0.0 - 0.5 K/uL    Absolute Basophils (Auto) 0.0 0.0 - 0.2 K/uL        Activity:      Objective   BP: 110/57  Heart Rate: 88  Pulse: 68  Temp: 98.3 F (36.8 C)  Resp: 16  Height: 5' 5 (165.1 cm)  Weight: 81.3 kg (179 lb 3.7 oz)  BMI (Calculated): 29.83  SpO2: 97 %    Temp (24hrs), Avg:98.5 F (36.9 C), Min:98.2 F (36.8 C), Max:98.8 F (37.1 C)      Signed   Asif Mahmood, MD     Hospital Medicine  08/28/2024  11:43 AM    Voice recognition dictation software has been used to generate this document.  In doing so, there is potential for error.  Examples of errors include spelling, punctuation, formatting, omission of words, insertion of words, incorrect words.  I have reviewed the document for accuracy.  Should there be concern for potential error that may impact treatment or billing, please reach out to me directly for clarification.   *Some  images could not be shown.

## 2024-08-28 NOTE — Progress Notes (Signed)
 Received call from patient. Pt with noted h/o Afib, CHF, COPD, T2DM. Tonight, pt c/o progressive chest tightness x4 days, weakness and increasing edema of the bil lower extremities, increasing intermittent palpitations and dyspnea lasting 2 min that self resolves and worsening fatigue over the past 2 days. Pt reported I feel off, something is not right. Pt endorsed full medication compliance. Denied fever, n/v/d or falls. Pt reported admission on last month for Afib and suspicion of CVA and further stated tonight I feel even worse than I did then. BP taken during call: 139/74, PR - 74. APP advised ER evaluation. Pt reported that her sister would take her to [facility] this evening. Pt advised that [facility] would call tomorrow to check pt status. Pt verbalized understanding.

## 2024-08-29 NOTE — Discharge Summary (Signed)
 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 female with past medical history of nonobstructive CAD, atrial fibrillation [on Eliquis ], heart failure with preserved ejection fraction, COPD epilepsy, autoimmune myelitis [dorsal column, myelopathy], does not have stiff person syndrome per Neuro, Bipolar disorder, history of CVA post endarterectomy, type 2 diabetes comes in with chest pain.  ACS workup was unremarkable.  She was seen by her primary cardiologist Dr. Read and cleared for discharge.  Chest pain resolved at the time of discharge.    Her left heart cath on 07/2021 showed normal coronaries and echo from 07/31/2024  showed EF of 55% with grade 1 diastolic function and moderate AR.  There was no pulmonary hypertension.  She recommended outpatient follow-up with Dr. Read.    She also has hypokalemia which has been recurrent for her.  This is due to use  of torsemide at home.  She was discharged home on oral potassium and recommended outpatient follow-up with her PCP for further monitoring of her potassium levels.  At the time of discharge her potassium levels had stabilized.  She was  discharged home in an independent state today.      Chief Complaint/History of Present Illness:  Date of admission: 08/27/2024  Chief complaints: Pressure-like chest pain  HPI:( per admitting provider)  [name] is an 68 y.o. female with past medical history of essential hypertension, heart failure with preserved ejection fraction paroxysmal atrial fibrillation, recurrent chest pain, left heart cath 07/2021 with normal coronaries CVA with no residual deficit, history of autoimmune myelitis, dyslipidemia who came from Home with With chief complaint of pressure-like chest pain radiating to the arms and back with associated shortness of breath.  Additionally noted that the chest pain is exertional. In the ED afebrile heart rate 99 respiration 18 blood pressure 125/75 99% on room air.  CBC differential unremarkable troponin 7 CMP potassium 2.7 anion gap 19 creatinine 1.5.  Chest x-ray unremarkable.  EKG with T wave inversions.   given normal saline bolus,potassium supplements.       Procedures:  None      Consultants:  Lobbyist Provider: [employer]  Consulting Provider: Read Papas, MD      Disposition: Home        Diagnostic Studies:  Recent Results (from the past 2 weeks)   1. CHEST PORTABLE - Chest Pain    Narrative    HISTORY:  CHEST PAIN    EXAM:  CHEST 1 VIEW    COMPARISONS:  Chest x-ray July 31, 2024.    FINDINGS:  Cardiomediastinal silhouette is normal. The lungs are clear with no  opacity, effusion, or pneumothorax. No acute bony abnormality  visualized.      Impression    IMPRESSION:  No significant findings.    Electronically signed by: Maranda Rail, MD  Board Certified  Radiologist 08/27/2024 9:49 PM   2. EKG 12 LEAD UNIT  PERFORMED   Result Value Ref Range    Heart Rate 92 bpm    RR Interval 652 ms    Atrial Rate 92 ms    P-R Interval 172 ms    P Duration 138 ms    P Horizontal Axis -11 deg    P Front Axis 46 deg    Q Onset 499 ms    QRSD Interval 92 ms    QT Interval 374 ms    QTcB 463 ms    QTcF 431 ms    QRS Horizontal Axis -26 deg    QRS Axis 24 deg    I-40 Front Axis 19 deg    t-40 Horizontal Axis -34 deg    T-40 Front Axis 24 deg    T Horizontal Axis 170 deg    T Wave Axis 213 deg    S-T Horizontal Axis 164 deg    S-T Front Axis 201 deg    Impression - ABNORMAL ECG -     Impression SR-Sinus rhythm-normal P axis, V-rate 50-99     Impression       PLAE-Probable  left atrial enlargement-P >67mS, <-0.69mV V1    Impression       -Repol abnrm suggests ischemia, diffuse leads, new since 07/31/2024, needing   close follow up-                Dear [name], MD   [name] is advised to follow up with you within 1-2 weeks.       Scheduled appointments:  Follow Up Info (next 45 days)       Follow up with Pouch Waynard, MD in  1 week    Specialty: Family Practice    Phone: [phone]    Where: [facility]      Friday Aug 30, 2024    My Health Care Coordinator Outreach Visit with Delores Aubry HERO, RN at  9:15 AM    Where: [facility]Health Care Coordination [facility])      Monday Sep 02, 2024    Prediabetes Follow Up with Interpreter with [facility] DI[facility] ALLISON FARROW at  1:00 PM    Where: [facility] [place] [place]  [facility])      [facility]  [address]  [place] [address]  [phone] [facility]  [facility]  [address], [address]  [place] [address]  [phone]                 Discharge Medication List        START taking these medications      potassium chloride  ER 10 mEq tablet,extended release (part/cryst)  Take 2 Tabs  by Mouth Once a Day for 30 days.  Dispense: 60 Tab  Refills: 0  Commonly known as: K-Dur;Klor-Con  M             CONTINUE taking these medications      albuterol  90 mcg/actuation Hfaa inhaler  Take 2 Puffs inhaled by mouth Every 4 Hours As Needed.  Refills: 0  Commonly known as: ProventiL , Ventolin , ProAir       Ammonium Lactate 12 % Crea  Use 1 Application to affected area Daily as needed (feet).  Refills: 0      apixaban  5 mg Tabs  Take 1 Tab by Mouth Twice Daily.  Refills: 0  Commonly known as: Eliquis       budesonide -formoteroL  80-4.5 mcg/actuation Hfaa  Take 2 Puffs inhaled by mouth 2 (two) times a day.  Refills: 0  Commonly known as: Symbicort       cloNIDine HCL 0.1 mg Tabs  Take 0.5 Tabs by Mouth Twice Daily.  Dispense: 90 Tab  Refills: 1  Commonly known as: Catapres      dapagliflozin propanediol 10 mg Tabs  Take 1 Tab by Mouth Once a Day.  Dispense: 30 Tab  Refills: 0  Commonly known as: Farxiga      ezetimibe 10 mg Tabs  Take 1 Tab by Mouth Once a Day.  Refills: 0  Commonly known as: Zetia      lamoTRIgine  100 mg Tabs  Take 1 Tab by Mouth Once a Day. Indications: epilepsy  Dispense: 30 Tab  Refills: 0  Commonly known as: LaMICtal       lurasidone  40 mg Tabs  Take 4 Tabs by Mouth Every Night at Bedtime.  Refills: 0  Commonly known as: Latuda       metOLazone 5 mg Tabs  Take 1 Tab by Mouth Once a Day.  Refills: 0  Commonly known as: Zaroxolyn      metoprolol  XL 25 mg Tb24  Take 0.5 Tabs by Mouth Once a Day.  Refills: 0  Commonly known as: Toprol  XL      Mycophenolate  Sodium 360 mg Tbec  Take 2 Tabs by Mouth Twice Daily.  Refills: 0      pantoprazole  40 mg Tbec  TAKE 1 TABLET BY MOUTH TWICE A DAY  Dispense: 180 Tab  Refills: 1  Commonly known as: Protonix       pregabalin  100 mg  Caps  Take 1 Cap by Mouth every evening.  Refills: 0  Commonly known as: Lyrica       * QUEtiapine  50 mg Tabs  Take 1 Tab by Mouth Every Night at Bedtime.  Refills: 0      * QUEtiapine  25 mg Tabs  Take 1 Tab by Mouth nightly as  needed.  Refills: 0  Commonly known as: SEROquel       rOPINIRole  0.5 mg Tabs  Take 1 Tab by Mouth Every Night at Bedtime.  Refills: 0  Commonly known as: Requip       topiramate  25 mg Tabs  TAKE 2 TABLETS BY MOUTH TWICE A DAY  Dispense: 180 Tab  Refills: 0  Commonly known as: Topamax       torsemide 20 mg Tabs  Take 1 Tab by Mouth Twice Daily.  Refills: 0  Commonly known as: Demadex      traMADoL  50 mg Tabs  Take 1 Tab by Mouth Every 6 Hours As Needed for Moderate Pain (Pain Score 4-6).  Refills: 0  Commonly known as: Ultram            * * This list has 2 medication(s) that are the same as other medications prescribed for you. Read the directions carefully, and ask your doctor or other care provider to review them with you.                               Discharge Exam:   BP 116/57   Pulse 81   Temp 98.4 F (36.9 C)   Resp 18   Ht 65 (1.651 m)   Wt 81.3 kg (179 lb 3.7 oz)   LMP 10/13/1979   SpO2 98%   BMI 29.83 kg/m     Vitals and nursing note reviewed.   Constitutional:       Appearance: Alert and oriented x3, appearance normal  HENT:      Head: Normocephalic and atraumatic.      Nose: Nose normal.      Mouth/Throat:      Mouth: Mucous membranes are moist.   Eyes:      Extraocular Movements: Extraocular movements intact.      Pupils: Pupils are equal, round, and reactive to light.   Cardiovascular:      Rate and Rhythm: Normal rate and regular rhythm.      Pulses: Normal pulses.      Heart sounds: Normal heart sounds.   Pulmonary:      Effort: Pulmonary effort is normal.      Breath sounds: Normal breath sounds.   Abdominal:      General: Abdomen is flat. Bowel sounds are normal.      Palpations: Abdomen is soft.   Musculoskeletal:         General: Normal range of motion.  No edema cyanosis.        No deformity  Skin:     General: Skin is warm.  No rash        Capillary Refill: Capillary refill takes less than 2 seconds.   Neurological:      General: No focal deficit present.      Mental Status: Alert and  Oriented  Psychiatric:         Mood and Affect: Mood normal.   Affect congruent to mood     Behavior: Behavior normal.  Recent Results (from the past 72 hours)   COMPREHENSIVE METABOLIC PANEL    Collection Time: 08/27/24  9:00 PM   Result Value Ref Range    Potassium 2.6 (LL) 3.5 - 5.5 mmol/L    Sodium 140 133 - 145 mmol/L    Chloride 94 (L) 98 - 110 mmol/L    Glucose 98 70 - 99 mg/dL    Calcium  9.7 8.4 - 10.5 mg/dL    Albumin  4.3 3.5 - 5.0 g/dL    SGPT (ALT) 17 5 - 40 U/L    SGOT (AST) 31 10 - 37 U/L    Bilirubin Total 0.3 0.2 - 1.2 mg/dL    Alkaline Phosphatase 112 40 - 120 U/L    BUN 12 6 - 22 mg/dL    CO2 27 20 - 32 mmol/L    Creatinine 1.5 (H) 0.8 - 1.4 mg/dL    eGFR 62.6 (L) >39.9 mL/min/1.73 sq.m.    Globulin 3.1 2.0 - 4.0 g/dL    A/G Ratio 1.4 1.1 - 2.6 ratio    Total Protein 7.4 6.2 - 8.1 g/dL    Anion Gap 80.9 (H) 3.0 - 15.0 mmol/L   TROPONIN    Collection Time: 08/27/24  9:00 PM   Result Value Ref Range    Troponin (T) Quant High Sensitivity (5th Gen) 7 0 - 19 ng/L   NT PROBNP    Collection Time: 08/27/24  9:00 PM   Result Value Ref Range    NT proBNP 73 <=125 pg/mL   CBC WITH DIFFERENTIAL AUTO    Collection Time: 08/27/24  9:00 PM   Result Value Ref Range    WBC 7.8 4.0 - 11.0 K/uL    RBC 4.51 3.80 - 5.20 M/uL    HGB 11.8 11.7 - 16.1 g/dL    HCT 62.2 64.8 - 51.6 %    MCV 84 80 - 99 fL    MCH 26 26 - 34 pg    MCHC 31 31 - 36 g/dL    RDW 85.5 89.9 - 84.4 %    Platelet 323 140 - 440 K/uL    MPV 10.6 9.0 - 13.0 fL    Segmented Neutrophils (Auto) 58 40 - 75 %    Lymphocytes (Auto) 30 20 - 45 %    Monocytes (Auto) 8 3 - 12 %    Eosinophils (Auto) 3 0 - 6 %    Basophils (Auto) 1 0 - 2 %    Absolute Neutrophils (Auto) 4.5 1.8 - 7.7 K/uL    Absolute Lymphocytes (Auto) 2.4 1.0 - 4.8 K/uL    Absolute Monocytes (Auto) 0.7 0.1 - 1.0 K/uL    Absolute Eosinophils (Auto) 0.2 0.0 - 0.5 K/uL    Absolute Basophils (Auto) 0.0 0.0 - 0.2 K/uL   MAGNESIUM  SERUM    Collection Time: 08/27/24 10:04 PM    Result Value Ref Range    Magnesium  1.9 1.6 - 2.5 mg/dL   GLUCOSE SCREEN    Collection Time: 08/28/24 12:42 AM   Result Value Ref Range    Glucose POC 116 (H) 70 - 99 mg/dL   Troponin    Collection Time: 08/28/24  1:11 AM   Result Value Ref Range    Troponin (T) Quant High Sensitivity (5th Gen) 6 0 - 19 ng/L   GLUCOSE SCREEN    Collection Time: 08/28/24  8:02 AM   Result Value Ref Range    Glucose POC 99 70 - 99 mg/dL   BASIC METABOLIC  PANEL    Collection Time: 08/28/24 11:25 AM   Result Value Ref Range    Potassium 2.7 (LL) 3.5 - 5.5 mmol/L    Sodium 134 133 - 145 mmol/L    Chloride 94 (L) 98 - 110 mmol/L    Glucose 109 (H) 70 - 99 mg/dL    Calcium  9.5 8.4 - 10.5 mg/dL    BUN 15 6 - 22 mg/dL    Creatinine 1.2 0.8 - 1.4 mg/dL    CO2 28 20 - 32 mmol/L    eGFR 48.0 (L) >60.0 mL/min/1.73 sq.m.    Anion Gap 12.0 3.0 - 15.0 mmol/L   Magnesium     Collection Time: 08/28/24 11:25 AM   Result Value Ref Range    Magnesium  2.1 1.6 - 2.5 mg/dL   CBC WITH DIFFERENTIAL AUTO    Collection Time: 08/28/24 11:25 AM   Result Value Ref Range    WBC 6.6 4.0 - 11.0 K/uL    RBC 4.13 3.80 - 5.20 M/uL    HGB 10.7 (L) 11.7 - 16.1 g/dL    HCT 65.7 (L) 64.8 - 48.3 %    MCV 83 80 - 99 fL    MCH 26 26 - 34 pg    MCHC 31 31 - 36 g/dL    RDW 85.8 89.9 - 84.4 %    Platelet 308 140 - 440 K/uL    MPV 10.2 9.0 - 13.0 fL    Segmented Neutrophils (Auto) 47 40 - 75 %    Lymphocytes (Auto) 39 20 - 45 %    Monocytes (Auto) 10 3 - 12 %    Eosinophils (Auto) 3 0 - 6 %    Basophils (Auto) 1 0 - 2 %    Absolute Neutrophils (Auto) 3.1 1.8 - 7.7 K/uL    Absolute Lymphocytes (Auto) 2.5 1.0 - 4.8 K/uL    Absolute Monocytes (Auto) 0.7 0.1 - 1.0 K/uL    Absolute Eosinophils (Auto) 0.2 0.0 - 0.5 K/uL    Absolute Basophils (Auto) 0.0 0.0 - 0.2 K/uL   GLUCOSE SCREEN    Collection Time: 08/28/24 12:01 PM   Result Value Ref Range    Glucose POC 102 (H) 70 - 99 mg/dL   GLUCOSE SCREEN    Collection Time: 08/28/24  4:49 PM   Result Value Ref Range    Glucose POC 97 70 -  99 mg/dL   BASIC METABOLIC PANEL    Collection Time: 08/29/24  2:55 AM   Result Value Ref Range    Potassium 3.7 3.5 - 5.5 mmol/L    Sodium 136 133 - 145 mmol/L    Chloride 99 98 - 110 mmol/L    Glucose 112 (H) 70 - 99 mg/dL    Calcium  9.8 8.4 - 10.5 mg/dL    BUN 12 6 - 22 mg/dL    Creatinine 1.1 0.8 - 1.4 mg/dL    CO2 23 20 - 32 mmol/L    eGFR 53.0 (L) >60.0 mL/min/1.73 sq.m.    Anion Gap 14.0 3.0 - 15.0 mmol/L   Magnesium     Collection Time: 08/29/24  2:55 AM   Result Value Ref Range    Magnesium  2.0 1.6 - 2.5 mg/dL   CBC WITH DIFFERENTIAL AUTO    Collection Time: 08/29/24  2:55 AM   Result Value Ref Range    WBC 7.7 4.0 - 11.0 K/uL    RBC 4.31 3.80 - 5.20 M/uL    HGB 10.9 (L) 11.7 - 16.1 g/dL  HCT 36.4 35.1 - 48.3 %    MCV 85 80 - 99 fL    MCH 25 (L) 26 - 34 pg    MCHC 30 (L) 31 - 36 g/dL    RDW 85.6 89.9 - 84.4 %    Platelet 302 140 - 440 K/uL    MPV 10.4 9.0 - 13.0 fL    Segmented Neutrophils (Auto) 42 40 - 75 %    Lymphocytes (Auto) 44 20 - 45 %    Monocytes (Auto) 8 3 - 12 %    Eosinophils (Auto) 5 0 - 6 %    Basophils (Auto) 1 0 - 2 %    Absolute Neutrophils (Auto) 3.2 1.8 - 7.7 K/uL    Absolute Lymphocytes (Auto) 3.4 1.0 - 4.8 K/uL    Absolute Monocytes (Auto) 0.6 0.1 - 1.0 K/uL    Absolute Eosinophils (Auto) 0.4 0.0 - 0.5 K/uL    Absolute Basophils (Auto) 0.0 0.0 - 0.2 K/uL   GLUCOSE SCREEN    Collection Time: 08/29/24  8:12 AM   Result Value Ref Range    Glucose POC 94 70 - 99 mg/dL   GLUCOSE SCREEN    Collection Time: 08/29/24 12:03 PM   Result Value Ref Range    Glucose POC 94 70 - 99 mg/dL   Potassium, Serum    Collection Time: 08/29/24 12:19 PM   Result Value Ref Range    Potassium 4.1 3.5 - 5.5 mmol/L        Patient Instructions     Discharge Procedure Orders   Discharge: Diet    No more than 2 gm of sodium (salt) per day (1 tsp or less). Salt makes the body hold water causing weight to increase leading to swelling and shortness of breath.  No processed (boxed) foods,canned foods, fried foods, or  fast food. Avoid lunch meats, bacon, sausage, and ham. Rinse all canned and frozen vegetables. Eat fresh when you can.  Limit the amount of fluids you drink to 48 oz or less daily including soups, ice, ice cream, Jell-O, etc..     Discharge: No Activity Restrictions     Discharge: Heart Failure Instructions    Discharge Instructions    What is heart failure? - Heart failure is a condition in which the heart does not pump well. This causes the heart to lag behind in its job of moving blood throughout the body. As a result, fluid backs up in the body, and the organs in the body do not get as much blood as they need. This can lead to symptoms, such as swelling, trouble breathing, and feeling tired.   If you have heart failure, your heart has not actually failed or stopped beating. It just isn't working as well as it should.    Symptoms:  Remember WORSEN    W=Weight gain (more than 3 pounds in one day or 5 pounds in one week, weigh every morning with no clothes and record your weight)  O=Orthopnea (difficulty breathing while lying flat, may need to sleep with extra pillows or in a recliner)  R=Resting more than usual  S=Shortness of breath (with activity and at rest)  E=Edema (swelling in lower legs, abdomen, scrotum)  N=Non-productive cough (a cough is not always a cold, it may mean fluid is backing up into your lungs)    Action Plan:  Watch your weight and monitor symptoms.  If you have any of these symptoms, call your cardiologists, the [facility]  or your PCP right away! You can likely take extra diuretics (fluid pills) to improve how you feel and avoid the hospital.    What can I do to help myself?  -Take your medicines every day even if you are feeling better around the same time every day.  They can reduce or get rid of your symptoms.   -Tell your doctor if you can't afford your medicines or they are causing side effects.   -Avoid medicines such as ibuprofen (Advil, Motrin) and naproxen  (Aleve) as they can make heart failure worse.  -Be active, lose weight, stop smoking, and limit alcohol intake.  -Please weigh yourself tomorrow morning and record this weight.  Call for weight gain of more than 3 lbs. in one day or 5 lbs. in one week.     Discharge: Weight    Weigh daily at the same time each day. If weight gain more than 3 pounds in a day or 5 pounds in a week, call your Cardiologist.   Your weight prior to discharge was: Weight: 81.3 kg (179 lb 3.7 oz)     Discharge: Additional Orders    Take all medications with you to all your follow up appointments     Referral to Cardiac Rehab - Systolic CHF    !!! CMS REQUIRES THE MOST RECENT EJECTION FRACTION TO BE LESS THAN OR EQUAL TO 35% TO QUALIFY FOR REHAB !!!  EF Echo       Date/Time                Value               Ref Range           Status                07/31/2024 02:28 PM      55                                                       ----------  This is the latest Ejection Fraction value contained in [employer].  This does not include outside sources of Ejection Fraction.  Patient needs to be stable for 6 weeks with no planned major hospitalizations in next 6 months to include LVAD or CABG (not considered major: ICD,PPM,PCI or minor adjustment of meds).     Region? [facility]    EF<36% Yes    NYHA class II-IV   NYHA Class:  I. no symptoms with ordinary activity, II. symptoms with ordinary activity, III. symptoms with any activity, IV. symptoms at rest NYHA class III    Initial Treatment Plan:  Physican-Prescribed Exercise: Initiate monitored exercise program per the Exercise Prescription Procedure Protocol.    Initial Treatment Plan:  Physican-Prescribed Exercise: Observe patient for signs of exercise intolerance and adapt or stop exercise, as indicated.    Initial Treatment Plan:  Physican-Prescribed Exercise: Complete functional assessments.    Frequency: Monitored exercise 2-5 days/week.    Intensity/Time: Target Rate of Perceived Exertion  (RPE):  11-15 (3-4 on CR10 Scale).    Intensity/Time: If no ETT is available, the THR may be calculated based on Exercise Prescription Procedure Protocol.    Intensity/Time: For functional capacity LESS than 4 Mets:  Weeks 1-2: 10-30 mins; Weeks 3-4: 15-40 mins; Week 5 to D/C: 25-60 mins.  Intensity/Time: For functional capacity GREATER than 4 Mets:  Weeks 1-2: 15-40 mins; Weeks 3 to D/C: 20-60 mins.    Mode: Aerobic and muscle strengthening exercises consisting of a variety of arm and leg exercises.    Nutrition: Complete nutrition assessment and implement plan.    Psychosocial assessment: Have patient complete outcome assessments to include functional, behavioral, and health-related tools on initial session.    Oxygen: Patient may exercise with current home oxygen prescription. If SpO2 falls below 90%, supplemental oxygen may be used to maintain SpO2 GREATER than or EQUAL to 90% following established hospital protocols.      Referral to Cardiac Rehabilitation    -  For diabetic patients:  May obtain a fingerstick glucose and follow guidance outlined in the Policy and Procedure.  -  Emergency orders:  Follow protocols outlined in the procedure Management of Medical Emergencies in the Cardiac and Pulmonary Rehabilitation Phase II and III Programs.  -  May transition to a Phase III/IV Exercise program post completion of Cardiac Rehab Program.     Region? [facility]    Initial Treatment Plan:  Physican-Prescribed Exercise: Initiate monitored exercise program per the Exercise Prescription Procedure Protocol.    Initial Treatment Plan:  Physican-Prescribed Exercise: Observe patient for signs of exercise intolerance and adapt or stop exercise, as indicated.    Initial Treatment Plan:  Physican-Prescribed Exercise: Complete functional assessments.    Frequency: Monitored exercise 2-5 days/week.    Intensity/Time: Target Rate of Perceived Exertion (RPE):  11-15 (3-4 on CR10 Scale).    Intensity/Time: If no ETT is available,  the THR may be calculated based on Exercise Prescription Procedure Protocol.    Intensity/Time: For functional capacity LESS than 4 Mets:  Weeks 1-2: 10-30 mins; Weeks 3-4: 15-40 mins; Week 5 to D/C: 25-60 mins.    Intensity/Time: For functional capacity GREATER than 4 Mets:  Weeks 1-2: 15-40 mins; Weeks 3 to D/C: 20-60 mins.    Mode: Aerobic and muscle strengthening exercises consisting of a variety of arm and leg exercises.    Nutrition: Complete nutrition assessment and implement plan.    Psychosocial assessment: Have patient complete outcome assessments to include functional, behavioral, and health-related tools on initial session.    Oxygen: Patient may exercise with current home oxygen prescription. If SpO2 falls below 90%, supplemental oxygen may be used to maintain SpO2 GREATER than or EQUAL to 90% following established hospital protocols.          Follow Up Info (next 45 days)       Follow up with Trudy Mina, MD in 1 week    Specialty: Family Practice    Phone: [phone]    Where: [facility]      Friday Aug 30, 2024    My Health Care Coordinator Outreach Visit with Delores Aubry HERO, RN at  9:15 AM    Where: [facility])      Monday Sep 02, 2024    Prediabetes Follow Up with Interpreter with [facility] DIAB ALLISON FARROW at  1:00 PM    Where: [facility] ([facility]  [facility])      [facility]  [address]  [place] [address]  [phone] [facility]  [facility]  [address]  [place] [address]  [phone]            Signed  Total time for discharge - review of data, exam, discussion with providers and care-team, med-rec and orders, arranging follow up, counseling of patient and/or family and documentation was 35 minutes       Judith Sor, MD    Hospital Medicine  08/29/2024  6:34 PM          *Some images could not be shown.

## 2024-09-06 NOTE — Progress Notes (Signed)
 Office Visit - Service Date: 09/06/24  Assessment & Plan      (Z09) Hospital discharge follow-up    (E87.6) Hypokalemia - Plan: Basic Metabolic Panel    (E11.9) Type 2 diabetes mellitus without complication, without long-term current use of insulin  (HCC) - Plan: Hemoglobin A1c with Est Avg Glucose    (N39.45) Continuous leakage of urine - Plan: mirabegron (MYRBETRIQ) 25 mg PO TB24    (J45.30) Mild persistent asthma without complication - cont inhalers    Hospital records, labs and imaging reviewed in Epic by me       DOCUMENTATION:    MEDICATIONS AND ALLERGIES WERE REVIEWED AND UPDATED IN EPIC  THE HEALTH MAINTENANCE RECORD WAS REVIEWED AND UPDATED IN EPIC  THE SOCIAL AND FAMILY HISTORY WAS REVIEWED AND UPDATED IN EPIC  THE PATIENTS PROBLEM LIST, PAST MEDICAL AND SURGICAL HISTORY WERE REVIEWED AND UPDATED.  SMOKING HISTORY WAS REVIEWED AND UPDATED IN EPIC        Chief Complaint         Patient presents with   . HOSPITAL F/U     Pt here today for f/u after hospital stay for hypokalemia         History of Present Illness      Hospitalized from Nov 4-Nov 6    She went in for CP.  Cardiac eval was negative  Dxd with hypokalemia    She  has been using the inhalers consistently now, but not before and that has made her feel better  So now, no CP and no SOB    She is having bladder leakage.  She has trouble making it to the bathroom.  She has to wear pads or depends.  This has been off and on since catheter was removed 18 months ago.      Home Medications     Home Medication List - Marked as Reviewed on 09/12/24 1733   Medication Sig   albuterol  (PROVENTIL , VENTOLIN , PROAIR ) 90 mcg/actuation INH HFAA inhaler Take 2 Puffs inhaled by mouth Every 4 Hours As Needed.   Ammonium Lactate 12 % Top CREA Use 1 Application to affected area Daily as needed (feet).   apixaban  (ELIQUIS ) 5 mg PO TABS Take 1 Tab by Mouth Twice Daily.   budesonide -formoteroL  (SYMBICORT ) 80-4.5 mcg/actuation INH HFAA Take 2 Puffs inhaled by mouth 2 (two)  times a day.   cloNIDine HCL (CATAPRES) 0.1 mg PO TABS Take 0.5 Tabs by Mouth Twice Daily.   dapagliflozin propanediol (FARXIGA) 10 mg PO TABS Take 1 Tab by Mouth Once a Day.   diclofenac sodium 1 % Top GEL Use 4 g to affected area 4 Times Daily As Needed.   ezetimibe (ZETIA) 10 mg PO TABS Take 1 Tab by Mouth Once a Day.   lamoTRIgine  (LAMICTAL ) 100 mg PO TABS Take 1 Tab by Mouth Once a Day. Indications: epilepsy   lurasidone  (LATUDA ) 40 mg PO TABS Take 4 Tabs by Mouth Every Night at Bedtime.   metOLazone (ZAROXOLYN) 5 mg PO TABS Take 1 Tab by Mouth Once a Day.   metoprolol  XL (TOPROL  XL) 25 mg PO TB24 Take 0.5 Tabs by Mouth Once a Day.   mirabegron (MYRBETRIQ) 25 mg PO TB24 Take 25 mg by Mouth Once a Day.   Mycophenolate  Sodium 360 mg PO TBEC Take 2 Tabs by Mouth Twice Daily.   pantoprazole  (PROTONIX ) 40 mg PO TBEC TAKE 1 TABLET BY MOUTH TWICE A DAY   potassium chloride  ER (K-DUR;KLOR-CON  M) 10 mEq  PO tablet,extended release (part/cryst) Take 2 Tabs by Mouth Once a Day for 30 days.   pregabalin  (LYRICA ) 100 mg PO CAPS Take 1 Cap by Mouth every evening.   QUEtiapine  (SEROQUEL ) 25 mg PO TABS Take 1 Tab by Mouth nightly as needed.   QUEtiapine  50 mg PO TABS Take 1 Tab by Mouth Every Night at Bedtime.   rOPINIRole  (REQUIP ) 0.5 mg PO TABS Take 1 Tab by Mouth Every Night at Bedtime.   topiramate  (TOPAMAX ) 25 mg PO TABS TAKE 2 TABLETS BY MOUTH TWICE A DAY   torsemide (DEMADEX) 20 mg PO TABS Take 1 Tab by Mouth Twice Daily.   traMADoL  (ULTRAM ) 50 mg PO TABS Take 1 Tab by Mouth Every 6 Hours As Needed for Moderate Pain (Pain Score 4-6).         Past Histories     The patient's medical, family, and social history (including tobacco usage) were reviewed and updated as appropriate.    Tobacco History reviewed:  Social History     Tobacco Use   Smoking Status Never   . Passive exposure: Never   Smokeless Tobacco Never      Counseling given: Not Answered      Physical Exam   Vital Signs: BP 130/62   Ht 5' 5 (1.651 m)   Wt  85.7 kg (189 lb)   LMP 10/13/1979   BMI 31.45 kg/m      Constitutional: She is well-developed and well-nourished. No distress.     HENT: Head: Normocephalic. Right Ear: Hearing normal.   Left Ear: Hearing normal.    Eyes: Conjunctivae and EOM are normal.   Respiratory:  Effort normal.   Neurological: She is alert. She is oriented to person, place, and time.   Psychiatric: Mood: Mood/affect normal. Behavior: Normal behavior.   Thought Process: Thought content normal. Memory: Normal .         Recent Results & Studies   I have reviewed pertinent labs. Recent Labs:  Results for orders placed or performed during the hospital encounter of 08/27/24   [facility] Telemetry Data   Result Value Ref Range    EKG Impression Sinus Rhythm     PR Interval 0.17     QRS 0.08     QT Interval 0.43     EKG Strip Comments NEW,  TF    [facility] Telemetry Data   Result Value Ref Range    EKG Impression Sinus Rhythm     PR Interval 0.16     QRS 0.12     RR 0.91     QT Interval 0.49     EKG Strip Comments HR 68 / ST DEP / BBB/ MM    [facility] Telemetry Data   Result Value Ref Range    EKG Impression Sinus Rhythm     PR Interval 0.16     QRS 0.10     RR 0.86     QT Interval 0.44     QTC Interval 0.47     EKG Strip Comments AA    [facility] Telemetry Data   Result Value Ref Range    EKG Impression Sinus Rhythm     PR Interval 0.17     QRS 0.10     RR 0.83     QT Interval 0.45     EKG Strip Comments HR 74 / ST DEP / MM    GLUCOSE SCREEN   Result Value Ref Range    Glucose POC 116 (H) 70 -  99 mg/dL   GLUCOSE SCREEN   Result Value Ref Range    Glucose POC 99 70 - 99 mg/dL   GLUCOSE SCREEN   Result Value Ref Range    Glucose POC 102 (H) 70 - 99 mg/dL   GLUCOSE SCREEN   Result Value Ref Range    Glucose POC 97 70 - 99 mg/dL   GLUCOSE SCREEN   Result Value Ref Range    Glucose POC 94 70 - 99 mg/dL   GLUCOSE SCREEN   Result Value Ref Range    Glucose POC 94 70 - 99 mg/dL   EKG 12 LEAD UNIT PERFORMED   Result Value Ref Range    Heart Rate 92 bpm    RR  Interval 652 ms    Atrial Rate 92 ms    P-R Interval 172 ms    P Duration 138 ms    P Horizontal Axis -11 deg    P Front Axis 46 deg    Q Onset 499 ms    QRSD Interval 92 ms    QT Interval 374 ms    QTcB 463 ms    QTcF 431 ms    QRS Horizontal Axis -26 deg    QRS Axis 24 deg    I-40 Front Axis 19 deg    t-40 Horizontal Axis -34 deg    T-40 Front Axis 24 deg    T Horizontal Axis 170 deg    T Wave Axis 213 deg    S-T Horizontal Axis 164 deg    S-T Front Axis 201 deg    Impression - ABNORMAL ECG -     Impression SR-Sinus rhythm-normal P axis, V-rate 50-99     Impression       PLAE-Probable  left atrial enlargement-P >64mS, <-0.85mV V1    Impression       -Repol abnrm suggests ischemia, diffuse leads, new since 07/31/2024, needing   close follow up-     COMPREHENSIVE METABOLIC PANEL   Result Value Ref Range    Potassium 2.6 (LL) 3.5 - 5.5 mmol/L    Sodium 140 133 - 145 mmol/L    Chloride 94 (L) 98 - 110 mmol/L    Glucose 98 70 - 99 mg/dL    Calcium  9.7 8.4 - 10.5 mg/dL    Albumin  4.3 3.5 - 5.0 g/dL    SGPT (ALT) 17 5 - 40 U/L    SGOT (AST) 31 10 - 37 U/L    Bilirubin Total 0.3 0.2 - 1.2 mg/dL    Alkaline Phosphatase 112 40 - 120 U/L    BUN 12 6 - 22 mg/dL    CO2 27 20 - 32 mmol/L    Creatinine 1.5 (H) 0.8 - 1.4 mg/dL    eGFR 62.6 (L) >39.9 mL/min/1.73 sq.m.    Globulin 3.1 2.0 - 4.0 g/dL    A/G Ratio 1.4 1.1 - 2.6 ratio    Total Protein 7.4 6.2 - 8.1 g/dL    Anion Gap 80.9 (H) 3.0 - 15.0 mmol/L   TROPONIN   Result Value Ref Range    Troponin (T) Quant High Sensitivity (5th Gen) 7 0 - 19 ng/L   NT PROBNP   Result Value Ref Range    NT proBNP 73 <=125 pg/mL   CBC WITH DIFFERENTIAL AUTO   Result Value Ref Range    WBC 7.8 4.0 - 11.0 K/uL    RBC 4.51 3.80 - 5.20 M/uL    HGB 11.8 11.7 -  16.1 g/dL    HCT 62.2 64.8 - 51.6 %    MCV 84 80 - 99 fL    MCH 26 26 - 34 pg    MCHC 31 31 - 36 g/dL    RDW 85.5 89.9 - 84.4 %    Platelet 323 140 - 440 K/uL    MPV 10.6 9.0 - 13.0 fL    Segmented Neutrophils (Auto) 58 40 - 75 %     Lymphocytes (Auto) 30 20 - 45 %    Monocytes (Auto) 8 3 - 12 %    Eosinophils (Auto) 3 0 - 6 %    Basophils (Auto) 1 0 - 2 %    Absolute Neutrophils (Auto) 4.5 1.8 - 7.7 K/uL    Absolute Lymphocytes (Auto) 2.4 1.0 - 4.8 K/uL    Absolute Monocytes (Auto) 0.7 0.1 - 1.0 K/uL    Absolute Eosinophils (Auto) 0.2 0.0 - 0.5 K/uL    Absolute Basophils (Auto) 0.0 0.0 - 0.2 K/uL   MAGNESIUM  SERUM   Result Value Ref Range    Magnesium  1.9 1.6 - 2.5 mg/dL   Troponin   Result Value Ref Range    Troponin (T) Quant High Sensitivity (5th Gen) 6 0 - 19 ng/L   BASIC METABOLIC PANEL   Result Value Ref Range    Potassium 2.7 (LL) 3.5 - 5.5 mmol/L    Sodium 134 133 - 145 mmol/L    Chloride 94 (L) 98 - 110 mmol/L    Glucose 109 (H) 70 - 99 mg/dL    Calcium  9.5 8.4 - 10.5 mg/dL    BUN 15 6 - 22 mg/dL    Creatinine 1.2 0.8 - 1.4 mg/dL    CO2 28 20 - 32 mmol/L    eGFR 48.0 (L) >60.0 mL/min/1.73 sq.m.    Anion Gap 12.0 3.0 - 15.0 mmol/L   Magnesium    Result Value Ref Range    Magnesium  2.1 1.6 - 2.5 mg/dL   CBC WITH DIFFERENTIAL AUTO   Result Value Ref Range    WBC 6.6 4.0 - 11.0 K/uL    RBC 4.13 3.80 - 5.20 M/uL    HGB 10.7 (L) 11.7 - 16.1 g/dL    HCT 65.7 (L) 64.8 - 48.3 %    MCV 83 80 - 99 fL    MCH 26 26 - 34 pg    MCHC 31 31 - 36 g/dL    RDW 85.8 89.9 - 84.4 %    Platelet 308 140 - 440 K/uL    MPV 10.2 9.0 - 13.0 fL    Segmented Neutrophils (Auto) 47 40 - 75 %    Lymphocytes (Auto) 39 20 - 45 %    Monocytes (Auto) 10 3 - 12 %    Eosinophils (Auto) 3 0 - 6 %    Basophils (Auto) 1 0 - 2 %    Absolute Neutrophils (Auto) 3.1 1.8 - 7.7 K/uL    Absolute Lymphocytes (Auto) 2.5 1.0 - 4.8 K/uL    Absolute Monocytes (Auto) 0.7 0.1 - 1.0 K/uL    Absolute Eosinophils (Auto) 0.2 0.0 - 0.5 K/uL    Absolute Basophils (Auto) 0.0 0.0 - 0.2 K/uL   BASIC METABOLIC PANEL   Result Value Ref Range    Potassium 3.7 3.5 - 5.5 mmol/L    Sodium 136 133 - 145 mmol/L    Chloride 99 98 - 110 mmol/L    Glucose 112 (H) 70 - 99 mg/dL    Calcium   9.8 8.4 - 10.5 mg/dL     BUN 12 6 - 22 mg/dL    Creatinine 1.1 0.8 - 1.4 mg/dL    CO2 23 20 - 32 mmol/L    eGFR 53.0 (L) >60.0 mL/min/1.73 sq.m.    Anion Gap 14.0 3.0 - 15.0 mmol/L   Magnesium    Result Value Ref Range    Magnesium  2.0 1.6 - 2.5 mg/dL   CBC WITH DIFFERENTIAL AUTO   Result Value Ref Range    WBC 7.7 4.0 - 11.0 K/uL    RBC 4.31 3.80 - 5.20 M/uL    HGB 10.9 (L) 11.7 - 16.1 g/dL    HCT 63.5 64.8 - 51.6 %    MCV 85 80 - 99 fL    MCH 25 (L) 26 - 34 pg    MCHC 30 (L) 31 - 36 g/dL    RDW 85.6 89.9 - 84.4 %    Platelet 302 140 - 440 K/uL    MPV 10.4 9.0 - 13.0 fL    Segmented Neutrophils (Auto) 42 40 - 75 %    Lymphocytes (Auto) 44 20 - 45 %    Monocytes (Auto) 8 3 - 12 %    Eosinophils (Auto) 5 0 - 6 %    Basophils (Auto) 1 0 - 2 %    Absolute Neutrophils (Auto) 3.2 1.8 - 7.7 K/uL    Absolute Lymphocytes (Auto) 3.4 1.0 - 4.8 K/uL    Absolute Monocytes (Auto) 0.6 0.1 - 1.0 K/uL    Absolute Eosinophils (Auto) 0.4 0.0 - 0.5 K/uL    Absolute Basophils (Auto) 0.0 0.0 - 0.2 K/uL     *Note: Due to a large number of results and/or encounters for the requested time period, some results have not been displayed. A complete set of results can be found in Results Review.               Signature:  [name], MD  [facility]   Dept: [phone]  Dept Fax: [phone]      Provider Location: Office  Name of Location: [facility]  City: [place]  State: [place]  Patient Location: Home  [address]  [place] [address]                                     Consent for Electronic Treatment:   This visit was conducted with the use of an interactive video telecommunication system that permits real-time communication between the patient and this provider. The patient has submitted their consent to be treated electronically by way of video technology. The risks and limitations of the process of telemedicine have been conveyed verbally during this encounter.

## 2024-09-11 ENCOUNTER — Telehealth: Payer: Self-pay | Admitting: Neurology

## 2024-09-11 NOTE — Telephone Encounter (Signed)
 Copied from CRM [id number]. Topic: Clinical Support - Speak With Nurse  >> Sep 11, 2024  4:24 PM Ardena GRADE wrote:  Patient called into the call center in reference to her medication metaxalone  (SKELAXIN ) 800 MG tablet. She states the the pharmacy filled the medication before she was ready to fill it and she informed them to hold off until she was ready. She just went to order the medication and they have taken it out the system and are now asking for the office to send over a new prescription. Can someone follow up thank you.     [facility] [address], [place] - [address]  [address] [place]  [address]  Phone: [phone] Fax: [phone]

## 2024-09-16 ENCOUNTER — Encounter: Payer: Self-pay | Admitting: Neurology

## 2024-09-16 ENCOUNTER — Ambulatory Visit: Attending: Neurology | Admitting: Neurology

## 2024-09-16 VITALS — BP 135/68 | HR 57 | Temp 98.9°F | Resp 16

## 2024-09-16 DIAGNOSIS — G629 Polyneuropathy, unspecified: Secondary | ICD-10-CM | POA: Insufficient documentation

## 2024-09-16 DIAGNOSIS — E538 Deficiency of other specified B group vitamins: Secondary | ICD-10-CM | POA: Insufficient documentation

## 2024-09-16 DIAGNOSIS — G9589 Other specified diseases of spinal cord: Secondary | ICD-10-CM | POA: Insufficient documentation

## 2024-09-16 DIAGNOSIS — R269 Unspecified abnormalities of gait and mobility: Secondary | ICD-10-CM | POA: Insufficient documentation

## 2024-09-16 DIAGNOSIS — E559 Vitamin D deficiency, unspecified: Secondary | ICD-10-CM | POA: Insufficient documentation

## 2024-09-16 MED ORDER — PREGABALIN 300 MG PO CAPS
300.0000 mg | ORAL_CAPSULE | Freq: Every evening | ORAL | 1 refills | Status: AC
Start: 2024-09-16 — End: 2025-03-15

## 2024-09-16 NOTE — Patient Instructions (Signed)
 [facility]  MULTIPLE SCLEROSIS & NEUROIMMUNOLOGY CENTER  Telephone: [phone]  Fax: [phone]  Send us  a mychart or inbasket message for the fastest response  ~~~~~~~~~~~~~~~~~~~~~~~~~~~~~~~~~~~~~~~~~~~~~~~~~~~~~~~~~~~~~~~~~~    WELCOME TO THE NEUROIMMUNOLOGY CLINIC!    MULTIPLE SCLEROSIS AND NEUROIMMUNE DISEASE UPDATE  (Updated January 02, 2024)     1) Clinic Procedures  We only do in-person visits   For over 2000 years, medicine has been a face to face, personal enterprise -- and the best medicine happens face to face. Although telemedicine is very convenient (and was necessary during the pandemic), I've seen poor outcomes with telemedicine care. In-person evaluations are critical part of you doing well. As such we do not offer video visits. Anything minor that does not require a visit can be handled over the MyChart patient portal.     Regular, ongoing, follow up visits is also important. Each person is different, but we see the average patient every 3-6 months. We may not refill controlled substances if you haven't been seen within 3-6 months, and we may not refill other medications if you haven't been seen within 12 months.    Let us  know if your insurance changes. If you lose insurance and live in [place], [place] offers [facility] via the [employer]. [place] [facility] [place] , [place] & [facility] offer similar for patients living in other areas). See [email or web address]    Reaching us   - Mychart is best. We will respond within 2-3 business days. (We do not use regular email as policy)  - My nurses' direct phone [phone]. The main neurology line is [phone]. Leave a message and we will get back to you in a few days.  - After hours, the main line [phone]) will page the on-call neurologist.    You will probably hear back from my nurses. I communicate with them throughout the day. The advice they give you is coming  from me - please take it seriously.    There is a 2 week turnaround time for any forms.     Emergency / After Hours Care.   If you have questions after hours, call the main number [phone]) and you will reach the on-call neurologist who can help you.    If you have severe, acute or debilitating symptoms, go to the emergency room. But also let us  know via mychart or a phone call. I can best help you if you are at an [facility] hospital. You might not have a choice where the ambulance takes you; if you are not at an [facility] hospital, I'll try to help you the best I can. But I probably can't see everything that's going on.    Urgent appointments are available within a few days. My nurses have access to more appointment slots than the call center does.     Testing. Details matter.  The tests I order are specific and high-quality MS care requires advanced technology    - MRI's: we prefer to work with [facility] radiology or [place] imaging. Some of our patients are enrolled in studies at [facility], and that works as well. If you have had outside MRI's done previously, please bring the CD's so that we can upload them.    - LABS: Go to the lab indicated on the top of your lab order. Do not take an [facility] lab slip to [facility]- they will do it incorrectly. Do not go to [facility] or your primary care doctor. Most patients are sent to [facility]  lab.    - Results of testing will be communicated once they all return at follow-up visits. Many of the tests are nuanced and must be interpreted within the overall clinical picture. I do not call for results.      2) Testing Phone Numbers  MRI & CT:  [facility] at [phone]  [facility] Fax: [phone]  [facility] at [phone]  For insurance approval, reach out to them first. If there is a denial, let us  know ASAP as we can help.     LP / Spinal Tap: Call [phone] for Specialized Imaging Scheduling. Backup is [facility] at [phone]    Labs: Any [place] location.  [email or web address]    3) Doing well with Neuroimmune Disease  Symptoms don't tell you how the disease is doing.  You can experience more symptoms even if the disease isn't worsening. Fluctuating symptoms are expected. Environmental factors like infections, extremes of heat or cold, stress (emotional or physiologic), fatigue, infections, other medical diseases, and certain foods may make you feel more symptoms. That's not the disease worsening, it's not more neurologic damage, and it's not a relapse. Those are worsening symptoms. Most neuroimmune patients have good days & bad days. You might need symptomatic medications to help you function well.    Also, some diseases worsen without producing more symptoms.     In general, if you have a question about your symptoms or notice any new symptoms, please reach out.      Monitoring the Disease and Medication Response:  MS and neuroimmune diseases are caused by your body's immune system attacking the nervous system (brain, spinal cord, nerves or muscles). We use immune treatments to prevent more damage. We can measure whether those treatments are working through objective testing - labs, MRI's, EMGs, etc.    For MS, NMO or MOG, we know that everyone develop more lesions, neurologic problems on exam and symptoms unless they are treated (Dr Oneil Alas in [place] proved this). MRI is one of the best ways of detecting more damage - it shows up as a new lesion. The clinical exam and labs can also help us . In MS and MOG, a relapse without symptoms is very common, so we do frequent labs and yearly MRIs to monitor the disease. This is very important to detect subclinical progression of disease.     For other neuroimmune diseases -- eg,  myasthenia, CIDP/GBS, or rheumatologic diseases -- not everyone requires treatment. But we use labs, exam, MRI's and/or EMG to monitor the diseases    Medications are the start, but they aren't  everything. They won't repair damage that already happened, and they may not help symptoms. It is to prevent more damage.     Physical and Cognitive Activity Promote Neurologic Recovery:   In order to improve the disease, the brain has to rewire around the damaged nerves. The best way to do this is by being active - physically and mentally. Physical, occupational or cognitive therapy can teach you exercises to address specific problems. Physical activity has been shown to improve energy, mood, and reduce pain. Be as physically and mentally active as you can; learn to pace yourself.    Diet:   There is no such thing as an MS diet or anti-inflammatory diet. Foods don't affect MRI lesions or prevent neurologic damage, but a good or bad diet can markedly impact symptoms. My suggestion is to eat healthy - we all know what that is! Diets like  the Wahl's protocol or Swank diet have ideas worth trying, but don't feel obligated to follow them precisely. See if adding fiber, reducing gluten, reducing dairy or eliminating processed foods makes a difference in how you feel.    Smoking makes MS worse - it causes more lesions & bigger lesions.     Cannabis worsens cognitive symptoms of MS (Brain fog).    Vitamins and Supplements  Vitamins can affect neurologic functioning:  - Vitaimn D and MS. High vit D levels correlate with better outcomes in MS patients. Target a total (25-OH) Vit D level between 50-100. Vitamin D3 (CHOLEcalciferol; white pill) works better than Vitamin D2 (ERGOcalciferol ; green pill). You can get high doses of vitamin D3 from amazon (eg, 10,000 or 50,000 units).     - Target vit B12 level above 500. Levels below 400 can cause nerve & spinal cord degeneration. Levels above the normal range are perfectly safe.    - Vitamin B6 (pyridoxine) and zinc toxicity cause neuropathy. Do not take B-complex or zinc. Other sources include multi-vitamins, some energy drinks, and other supplements. Check the back of the  bottle, not just the front. Candance Cary CD. Pyridoxine toxicity courtesy of your local health food store Ann Rheum Dis. 2006 Dec;65(12):1666-7]    - marked copper deficiency also causes nerve & spinal cord degeneration    - the data around other supplements like biotin, alpha-lipoic acid, etc is unclear, so I generally don't recommend them    Support Groups  NMSS [place] (via Zoom) Elveria Sever. Her cell is [phone].  MS Alliance of [place] / [place] / Donneta GLENWOOD Elvie Cherilyn [phone]   NMSS [place] Kim Kiggins. [phone]. [email or web address]  NMSS [place]  / online Speak MS To Me. Odella Geralynn Blush. [phone]. [email or web address]. Monday 2 PM    National MS Society [facility]): malpracticeboard.com.au  Myasthenia Gravis Foundation of America (MGFA): moralgame.si  [employer] for NMO: https://www.sumairafoundation.org/  [employer] for NMO: https://guthyjacksonfoundation.org/  Foundation for [employer] Research  discfull.com.cy    3) Neurologic Disease, Immune Suppressants and Infections  - MS, NMO, MOG, and GBS/CIDP themselves do not increase your risk of infection (including COVID). Severe myasthenia might increase your risk for lung infections    - Call us  if you develop a fever more than 101 F, have a green-yellow cough longer than 1 week, or if you have recurring or difficult-to-treat infections. Immune suppressants can increase infection risk. In this situation, you will probably require antibiotics (and stronger and longer than normal).    - None of the MS medications increase your COVID risk above that of an untreated patient (COVIMS study)    - Infections can cause more symptoms. That's usually not disease worsening or neurologic damage from infection. That is symptom worsening, similar to what happens with stress or the heat. You will get better a few days after  the infection improves.    - Rarely, COVID can cause neurologic complications. Early on, with severe COVID, we saw a lot of strokes and seizures. We see less of that now. But, we are seeing the emergence of a post-COVID neurologic syndrome that looks like Guillain-Barre syndrome or transverse myelitis. The way to test for this is with a spinal tap (lumbar puncture). Neuro-COVID is treatable.    4) Vaccines  You don't need to time your vaccinations with infusion (or mavenclad) dates. There have been theoretical concerns about immunosuppression affecting vaccine effectiveness, but recent research has shown this not to be  true.  Re Mavenclad: The contraindication and timing requirements do not apply to most vaccines. They are only for live virus vaccines, which are contraindicated anwyays    Safe Vaccines, regardless of immunosuppressant  - Pfizer, Moderna, Novavax and Booster COVID vaccines are safe in MS and with all immunosuppressants. (There were issues with J&J and Astra-Zeneca).    - Flu vaccine. Use Flublok brand only (recombinant hemagluttin quadrivalent). This is per FDA guidance for all neurologic patients. It can be difficult to find and we can give you an RX for it.     - Shingles vaccine. Use Shingrix (RZV, recombinant zoster) vaccine. This is per FDA guidance for all neurologic patients.     - All pneumonia vaccines are safe (Prevnar & Capvaxive )    - Tetanus toxoid and TDAP are ok (TDAP is preferred over DTAP).     Unsafe Vaccines (for any neuroimmune patients)  - Live virus or inactivated virus vaccines are contraindicated in neuro-immunologic patients (Including measles vaccine - this is contraindicated)    - monkeypox vaccines (JYNNEOS and ACAM2000) are contraindicated in MS and patients on immunosupressants.     - RSV vaccines are associated with cases of Guillian Barre syndrome, so I would avoid them.     - J&J and Astra Zeneca COVID vaccine were associated with transverse myelitis    5) Other  Notes:  - MS is compatible with pregnancy but we need to know if you plan on getting pregnant or become pregnant as we may need to adjust your treatment. Certain disease modifying treatments for MS are better than others for those seeking to become pregnant. Please also let me know when you find out you're pregnant.     - I do not prescribe long term opioids for nerve pain. Prescription opioids lead to worse MS symptoms, with studies showing significantly worse fatigue scores and worse pain scores compared to non-opioid users. Neurological pain is best managed by evidence-based non-opiate neuropathic pain medications. Complicated chronic pain management or non-neurological pain management will be referred to pain clinic.     - I do not recommend the use of marijuana or ingestion of THC /CBD containing products. Studies show marijuana worsen cognitive scores in MS and contribute to worsened brain fog and fatigue. Studies do not support the use of medical marijuana for spasticity or pain given a lack of objective efficacy to relieving spasticity. Topical CBD creams and ointments available over hte counter are safer as very little gets into your bloodstream. Instead, we use a comprehensive evidence-based approach in managing spasticity and pain that is individualized to the patient.      6) Phone Numbers  Drug Patient Assistance Programs  If you run into insurance / supply issues with a branded DMT, you may be able to get a temporary supply from the manufacturer. Also call us  & your infusion center.     Briumvi ([employer]). [phone]. Fax [phone]    Soliris & Ultomiris ([employer] One Source). [phone]. Fax [phone]. [email or web address]    Uplizna ([employer] / [employer] By Your Side) [phone]. Fax [phone]    Enspryng [facility] Access Solution) [phone]. Fax [phone]. Text [phone]    Zilbrysq ([employer], Onward). [phone]. Fax [phone].     Kesimpta ([employer] Alongside).  [phone]. Fax [phone]    Mavenclad (EMD Serono MS Lifelines). [phone]. Fax [phone]    Ocrevus  & Ocrevus  Zunovo [facility] [employer]). [phone]. Fax [phone]. Text [phone]    Zeposia ([employer] One Source) [phone]; Fax [phone]  Vyvgart & Vyvgart Hytrulo ([employer]x MyPath) [phone]. Fax [phone]    Infusion Centers  [facility]  [address] [place], [place] [address]  Phone: [phone]    [facility] Fax [phone]    Devonne Perry @ [employer] Phone Number: [phone]. Fax [phone]    [facility]  Address: [address] [place], [place] [address] and other locations  Phone: [phone]    Fax: [phone]    [facility]  Fax: [phone]  Phone: [phone] or [phone]    [facility]  Phone: [phone]  Fax: [phone]    Physical Therapy  [facility] [phone]   [facility]   [phone]   [facility] [phone]   [facility]   [phone]   [facility]    [phone]     Dr Truman Hopkins has home-PT tips for MS  [email or web address]    Thank you,      Sammi Lockwood, MD PHD  Director, Neuroimmunology & MS Center  [facility]  [address], [place], [place] [address]    Telephone [phone]  Fax [phone]   The fastest response is via national city

## 2024-09-16 NOTE — Progress Notes (Signed)
 [facility]  MULTIPLE SCLEROSIS & NEUROIMMUNOLOGY CENTER  Telephone: [phone]  Fax: [phone]  Send us  a mychart or inbasket message for the fastest response  ~~~~~~~~~~~~~~~~~~~~~~~~~~~~~~~~~~~~~~~~~~~~~~~~~~~~~~~~~~~~~~~~~~~~~~~~~~~~~~~~~~~~  Visit Date: 09/16/2024    Referring Neurologist:     CC: autoimmune dorsal column myelopathy     HPI:   History was obtained from records review, patient,     68 y.o. year-old female with severe leg pain. She feels burning/tingling leg pain that is severe. Some stiffness.Not as much weakness per se.       EXAM  Visit Vitals  BP 135/68 (BP Site: Right arm, Patient Position: Sitting, Cuff Size: Medium)   Pulse (!) 57   Temp 98.9 F (37.2 C) (Oral)   Resp 16   LMP  (LMP Unknown)   SpO2 95%     General:   Optic Nerves:   Psychiatric.   Mental Status: The patient was awake, alert, appropriate  Cranial Nerves: CN II-XII w/ right face weakness  Motor: reduce pinprick BLE legs. Hands & face are similar with pinprick  Reflex:   Sensation: severe leg allodynia  Coordination:   Gait: deferred; in wheelchair. Limited by leg pain      Imaging     Reports:  CT Head WO Contrast  Result Date: 05/23/2024  Impression:  IMPRESSION: 1.  No acute intracranial findings. 2.  Pansinus inflammatory sinus disease with air-fluid levels and frothy secretions. Correlate for signs of acute sinusitis. Electronically signed by: Loman Loges, MD  Board Certified Radiologist 05/23/2024 4:58 PM EDT    CT Head WO Contrast  Result Date: 03/27/2024  Impression:  IMPRESSION: Unremarkable study. Electronically signed by: MARLA Franky Oaks, MD  Board Certified Radiologist 03/27/2024 7:09 PM EDT    CT Head WO Contrast  Result Date: 12/01/2023  Impression:  IMPRESSION: No acute intracranial abnormality. Electronically Signed   By: Suzen Dials M.D.   On: 12/01/2023 20:45       Testing       Labs  Lab Results   Component Value Date    VITB6 3.6 05/30/2023    HGBA1C 6.0 (H) 05/30/2023    EGFR >60.0  06/14/2024    CREAT 0.8 06/14/2024    WBC 4.92 01/22/2024    LYMPHOABS 2.5 08/08/2023    IGG 984 08/10/2023    IGA 477 (H) 05/30/2023    IGM 52 05/30/2023    B12 >2,000 (H) 06/14/2024    VITD 33 06/14/2024     Component  Ref Range & Units 2 wk ago   WBC  4.0 - 11.0 K/uL 6.6   RBC  3.80 - 5.20 M/uL 4.13   HGB  11.7 - 16.1 g/dL 89.2 Low    HCT  64.8 - 48.3 % 34.2 Low    MCV  80 - 99 fL 83   MCH  26 - 34 pg 26   MCHC  31 - 36 g/dL 31   RDW  89.9 - 84.4 % 14.1   Platelet  140 - 440 K/uL 308   MPV  9.0 - 13.0 fL 10.2   Segmented Neutrophils (Auto)  40 - 75 % 47   Lymphocytes (Auto)  20 - 45 % 39   Monocytes (Auto)  3 - 12 % 10   Eosinophils (Auto)  0 - 6 % 3   Basophils (Auto)  0 - 2 % 1   Absolute Neutrophils (Auto)  1.8 - 7.7 K/uL 3.1   Absolute Lymphocytes (Auto)  1.0 - 4.8  K/uL 2.5   Absolute Monocytes (Auto)  0.1 - 1.0 K/uL 0.7   Absolute Eosinophils (Auto)  0.0 - 0.5 K/uL 0.2   Absolute Basophils (Auto)  0.0 - 0.2 K/uL 0.0       ASSESSMENT / PLAN  RTC: Return in about 1 month (around 10/16/2024) for [place].    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 Sensory neuropathy    3. Neurologic gait dysfunction    4. Vitamin D  deficiency    5. 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 A negative CSF GAD65 definitively excludes Stiff Person Syndrome. She tolerated MYP 4 tabs daily better, without any infections since last visit    Severe neurpathic leg pain c/w her syndrome; will increase pregabalin .     PLAN  Continue MYP 720mg  bid  vit b12 1000mcg daily  vit d3 50k/week  Reduce metaxolone 800mg  bid to qid (some abdominal cramps with it)  Increase pregabalin  300-600mg  qhs      Orders today (details below)  No orders of the defined types were placed in this encounter.    Orders Placed This Encounter   Medications    pregabalin  (LYRICA ) 300 MG capsule     Sig: Take 1-2  capsules (300-600 mg) by mouth once every evening     Dispense:  180 capsule     Refill:  1       I spent 40 minutes reviewing the records, talking with the patient and developing their care plan. This excludes time for separately billed procedures.      Sammi Lockwood, MD PHD  Director, Neuroimmunology & MS Center  [facility]  [address], [place], [place] [address]    Telephone [phone]  Fax [phone]   The fastest response is via national city  ===================================================================  NFL (neurofilament light chain) Insurance Justification  Serum / plasma Neurofilament light chain is a well-established biomarker for monitoring MS, and regular testing has been incorporated into the Consortium of MS Centers [facility]) guidelines for the monitoring and clinical decision making for MS.     1)CMSC guidelines PDF: https://mscare.http://www.stevens.com/     2) [place] MS, Zora GORMAN Britain RA, [place], [place], Kuhle J, Lycke J, Olsson T; Consortium of Multiple Sclerosis Centers. Guidance for use of neurofilament light chain as a cerebrospinal fluid and blood biomarker in multiple sclerosis management. EBioMedicine. 2024 Mar;101:104970. doi: 10.1016/j.ebiom.7975.895029. Epub 2024 Feb 13. PMID: 61645467; PMCID: [id number].    PATIENT INSTRUCTIONS PROVIDED  Patient was given an After Visit Summary with a copy of the testing orders, medications and the following other instructions:  Patient Instructions   [place] [facility]  MULTIPLE SCLEROSIS & NEUROIMMUNOLOGY CENTER  Telephone: [phone]  Fax: [phone]  Send us  a mychart or inbasket message for the fastest response  ~~~~~~~~~~~~~~~~~~~~~~~~~~~~~~~~~~~~~~~~~~~~~~~~~~~~~~~~~~~~~~~~~~    WELCOME TO THE NEUROIMMUNOLOGY CLINIC!    MULTIPLE SCLEROSIS AND NEUROIMMUNE DISEASE UPDATE  (Updated January 02, 2024)      1) Clinic Procedures  We only do in-person visits   For over 2000 years, medicine has been a face to face, personal enterprise -- and the best medicine happens face to face. Although telemedicine is very convenient (and was necessary during the pandemic), I've seen poor outcomes with telemedicine care. In-person  evaluations are critical part of you doing well. As such we do not offer video visits. Anything minor that does not require a visit can be handled over the MyChart patient portal.     Regular, ongoing, follow up visits is also important. Each person is different, but we see the average patient every 3-6 months. We may not refill controlled substances if you haven't been seen within 3-6 months, and we may not refill other medications if you haven't been seen within 12 months.    Let us  know if your insurance changes. If you lose insurance and live in [place], [place] offers [facility] via the [employer]. [facility] [place] , [place] & [facility] offer similar for patients living in other areas). See [email or web address]    Reaching us   - Mychart is best. We will respond within 2-3 business days. (We do not use regular email as policy)  - My nurses' direct phone [phone]. The main neurology line is [phone]. Leave a message and we will get back to you in a few days.  - After hours, the main line [phone]) will page the on-call neurologist.    You will probably hear back from my nurses. I communicate with them throughout the day. The advice they give you is coming from me - please take it seriously.    There is a 2 week turnaround time for any forms.     Emergency / After Hours Care.   If you have questions after hours, call the main number [phone]) and you will reach the on-call neurologist who can help you.    If you have severe, acute or debilitating symptoms, go to the emergency room. But also let us  know via  mychart or a phone call. I can best help you if you are at an [place] hospital. You might not have a choice where the ambulance takes you; if you are not at an [place] hospital, I'll try to help you the best I can. But I probably can't see everything that's going on.    Urgent appointments are available within a few days. My nurses have access to more appointment slots than the call center does.     Testing. Details matter.  The tests I order are specific and high-quality MS care requires advanced technology    - MRI's: we prefer to work with [facility] radiology or [facility]. Some of our patients are enrolled in studies at [facility], and that works as well. If you have had outside MRI's done previously, please bring the CD's so that we can upload them.    - LABS: Go to the lab indicated on the top of your lab order. Do not take an [facility] lab slip to [facility]- they will do it incorrectly. Do not go to [facility] or your primary care doctor. Most patients are sent to [facility] lab.    - Results of testing will be communicated once they all return at follow-up visits. Many of the tests are nuanced and must be interpreted within the overall clinical picture. I do not call for results.      2) Testing Phone Numbers  MRI & CT:  [facility] at [phone]  [facility] Fax: [phone]  [facility] at [phone]  For insurance approval, reach out to them first. If there is a denial, let us  know ASAP as we can help.     LP / Spinal Tap: Call [phone] for Specialized Imaging Scheduling. Backup is [facility] at [phone]    Labs: Any [place] location. [email or web address]    3) Doing well with Neuroimmune Disease  Symptoms don't tell you how the disease is doing.  You can experience more symptoms even if the disease isn't worsening. Fluctuating symptoms are expected. Environmental factors like infections, extremes of heat or cold, stress (emotional or physiologic), fatigue, infections, other medical  diseases, and certain foods may make you feel more symptoms. That's not the disease worsening, it's not more neurologic damage, and it's not a relapse. Those are worsening symptoms. Most neuroimmune patients have good days & bad days. You might need symptomatic medications to help you function well.    Also, some diseases worsen without producing more symptoms.     In general, if you have a question about your symptoms or notice any new symptoms, please reach out.      Monitoring the Disease and Medication Response:  MS and neuroimmune diseases are caused by your body's immune system attacking the nervous system (brain, spinal cord, nerves or muscles). We use immune treatments to prevent more damage. We can measure whether those treatments are working through objective testing - labs, MRI's, EMGs, etc.    For MS, NMO or MOG, we know that everyone develop more lesions, neurologic problems on exam and symptoms unless they are treated (Dr Oneil Alas in [place] proved this). MRI is one of the best ways of detecting more damage - it shows up as a new lesion. The clinical exam and labs can also help us . In MS and MOG, a relapse without symptoms is very common, so we do frequent labs and yearly MRIs to monitor the disease. This is very important to detect subclinical progression of disease.     For other neuroimmune diseases -- eg,  myasthenia, CIDP/GBS, or rheumatologic diseases -- not everyone requires treatment. But we use labs, exam, MRI's and/or EMG to monitor the diseases    Medications are the start, but they aren't everything. They won't repair damage that already happened, and they may not help symptoms. It is to prevent more damage.     Physical and Cognitive Activity Promote Neurologic Recovery:   In order to improve the disease, the brain has to rewire around the damaged nerves. The best way to do this is by being active - physically and mentally. Physical, occupational or cognitive therapy can teach you  exercises to address specific problems. Physical activity has been shown to improve energy, mood, and reduce pain. Be as physically and mentally active as you can; learn to pace yourself.    Diet:   There is no such thing as an MS diet or anti-inflammatory diet. Foods don't affect MRI lesions or prevent neurologic damage, but a good or bad diet can markedly impact symptoms. My suggestion is to eat healthy - we all know what that is! Diets like the Wahl's protocol or Swank diet have ideas worth trying, but don't feel obligated to follow them precisely. See if adding fiber, reducing gluten, reducing dairy or eliminating processed foods makes a difference in how you feel.    Smoking makes MS worse - it causes more lesions & bigger lesions.     Cannabis worsens cognitive symptoms of MS (Brain fog).    Vitamins and Supplements  Vitamins can affect neurologic functioning:  - Vitaimn D and MS. High vit D levels correlate with better outcomes in MS patients. Target a total (25-OH) Vit D level between 50-100. Vitamin D3 (CHOLEcalciferol ; white pill) works  better than Vitamin D2 (ERGOcalciferol ; green pill). You can get high doses of vitamin D3 from amazon (eg, 10,000 or 50,000 units).     - Target vit B12 level above 500. Levels below 400 can cause nerve & spinal cord degeneration. Levels above the normal range are perfectly safe.    - Vitamin B6 (pyridoxine ) and zinc  toxicity cause neuropathy. Do not take B-complex or zinc . Other sources include multi-vitamins, some energy drinks, and other supplements. Check the back of the bottle, not just the front. Candance Cary CD. Pyridoxine  toxicity courtesy of your local health food store Ann Rheum Dis. 2006 Dec;65(12):1666-7]    - marked copper  deficiency also causes nerve & spinal cord degeneration    - the data around other supplements like biotin, alpha-lipoic acid, etc is unclear, so I generally don't recommend them    Support Groups  NMSS [place] (via Zoom) Elveria Sever. Her cell is [phone].  MS Alliance of [place] / [place] / Donneta GLENWOOD Elvie Cherilyn [phone]   NMSS [place] Kim Kiggins. [phone]. [email or web address]  NMSS [place]  / online Speak MS To Me. Odella Geralynn Blush. [phone]. [email or web address]. Monday 2 PM    National MS Society [facility]): malpracticeboard.com.au  Myasthenia Gravis Foundation of America (MGFA): moralgame.si  [employer] for NMO: https://www.sumairafoundation.org/  [employer] for NMO: https://guthyjacksonfoundation.org/  Foundation for Sarcoid Research  discfull.com.cy    3) Neurologic Disease, Immune Suppressants and Infections  - MS, NMO, MOG, and GBS/CIDP themselves do not increase your risk of infection (including COVID). Severe myasthenia might increase your risk for lung infections    - Call us  if you develop a fever more than 101 F, have a green-yellow cough longer than 1 week, or if you have recurring or difficult-to-treat infections. Immune suppressants can increase infection risk. In this situation, you will probably require antibiotics (and stronger and longer than normal).    - None of the MS medications increase your COVID risk above that of an untreated patient (COVIMS study)    - Infections can cause more symptoms. That's usually not disease worsening or neurologic damage from infection. That is symptom worsening, similar to what happens with stress or the heat. You will get better a few days after the infection improves.    - Rarely, COVID can cause neurologic complications. Early on, with severe COVID, we saw a lot of strokes and seizures. We see less of that now. But, we are seeing the emergence of a post-COVID neurologic syndrome that looks like Guillain-Barre syndrome or transverse myelitis. The way to test for this is with a spinal tap (lumbar puncture). Neuro-COVID is  treatable.    4) Vaccines  You don't need to time your vaccinations with infusion (or mavenclad) dates. There have been theoretical concerns about immunosuppression affecting vaccine effectiveness, but recent research has shown this not to be true.  Re Mavenclad: The contraindication and timing requirements do not apply to most vaccines. They are only for live virus vaccines, which are contraindicated anwyays    Safe Vaccines, regardless of immunosuppressant  - Pfizer, Moderna, Novavax and Booster COVID vaccines are safe in MS and with all immunosuppressants. (There were issues with J&J and Astra-Zeneca).    - Flu vaccine. Use Flublok brand only (recombinant hemagluttin quadrivalent). This is per FDA guidance for all neurologic patients. It can be difficult to find and we can give you an RX for it.     - Shingles vaccine. Use Shingrix (RZV, recombinant zoster) vaccine.  This is per FDA guidance for all neurologic patients.     - All pneumonia vaccines are safe (Prevnar & Capvaxive )    - Tetanus toxoid and TDAP are ok (TDAP is preferred over DTAP).     Unsafe Vaccines (for any neuroimmune patients)  - Live virus or inactivated virus vaccines are contraindicated in neuro-immunologic patients (Including measles vaccine - this is contraindicated)    - monkeypox vaccines (JYNNEOS and ACAM2000) are contraindicated in MS and patients on immunosupressants.     - RSV vaccines are associated with cases of Guillian Barre syndrome, so I would avoid them.     - J&J and Astra Zeneca COVID vaccine were associated with transverse myelitis    5) Other Notes:  - MS is compatible with pregnancy but we need to know if you plan on getting pregnant or become pregnant as we may need to adjust your treatment. Certain disease modifying treatments for MS are better than others for those seeking to become pregnant. Please also let me know when you find out you're pregnant.     - I do not prescribe long term opioids for nerve pain.  Prescription opioids lead to worse MS symptoms, with studies showing significantly worse fatigue scores and worse pain scores compared to non-opioid users. Neurological pain is best managed by evidence-based non-opiate neuropathic pain medications. Complicated chronic pain management or non-neurological pain management will be referred to pain clinic.     - I do not recommend the use of marijuana or ingestion of THC /CBD containing products. Studies show marijuana worsen cognitive scores in MS and contribute to worsened brain fog and fatigue. Studies do not support the use of medical marijuana for spasticity or pain given a lack of objective efficacy to relieving spasticity. Topical CBD creams and ointments available over hte counter are safer as very little gets into your bloodstream. Instead, we use a comprehensive evidence-based approach in managing spasticity and pain that is individualized to the patient.      6) Phone Numbers  Drug Patient Assistance Programs  If you run into insurance / supply issues with a branded DMT, you may be able to get a temporary supply from the manufacturer. Also call us  & your infusion center.     Briumvi ([employer]). [phone]. Fax [phone]    Soliris & Ultomiris ([employer] One Source). [phone]. Fax [phone]. Onesource@alexion [email or web address]    Designer, Television/film Set (Hor[employer] / [employer] By Your Side) [phone]. Fax [phone]    Enspryng [facility] Access Solution) [phone]. Fax [phone]. Text [phone]    Zilbrysq ([facility], Onward). [phone]. Fax [phone].     Kesimpta ([employer] Alongside). [phone]. Fax [phone]    Mavenclad (EMD Serono MS Lifelines). [phone]. Fax [phone]    Ocrevus & Ocrevus Zunovo [facility] [employer] Corporation). [phone]. Fax [phone]. Text [phone]    Zeposia ([employer] One Source) [phone]; Fax [phone]    Vyvgart & Vyvgart Hytrulo (Argenx MyPath) [phone]. Fax [phone]    Infusion  Centers  [facility]  [address] [place], [place] [address]  Phone: [phone]    [facility] Fax [phone]    Devonne Perry @ [employer] Phone Number: [phone]. Fax [phone]    [facility]  Address: [address] [place], [place] [address] and other locations  Phone: [phone]    Fax: [phone]    [facility]  Fax: [phone]  Phone: [phone] or [phone]    [facility]  Phone: [phone]  Fax: [phone]    Physical Therapy  [facility] [phone]   [facility]   [phone]   [facility] [phone]   [facility]   [phone]   [facility]    [phone]     Dr Truman Hopkins has home-PT tips for MS  [email or web address]    Thank you,      Sammi Lockwood, MD PHD  Director, Neuroimmunology & MS Center  [facility]  [address], [place], [place] [address]    Telephone [phone]  Fax [phone]   The fastest response is via national city      IMPORTED INFORMATION FROM MEDICAL RECORDS   Diagnosis ICD-10-CM Associated Order   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 Sensory neuropathy  G62.9 pregabalin  (LYRICA ) 300 MG capsule      3. Neurologic gait dysfunction  R26.9       4. Vitamin D  deficiency  E55.9       5. Vitamin B12 deficiency  E53.8         No orders of the defined types were placed in this encounter.

## 2024-09-24 ENCOUNTER — Other Ambulatory Visit: Payer: Self-pay

## 2024-09-24 DIAGNOSIS — G2582 Stiff-man syndrome: Secondary | ICD-10-CM

## 2024-09-24 DIAGNOSIS — R269 Unspecified abnormalities of gait and mobility: Secondary | ICD-10-CM

## 2024-09-24 DIAGNOSIS — G9589 Other specified diseases of spinal cord: Secondary | ICD-10-CM

## 2024-09-24 MED ORDER — METAXALONE 800 MG PO TABS: 800.0000 mg | ORAL_TABLET | Freq: Four times a day (QID) | ORAL | 0 refills | Status: AC

## 2024-09-24 NOTE — Telephone Encounter (Signed)
 LOV 08/2024  Follow up 10/2024    QID pended  Pharmacy will not refill without a new prescription

## 2024-10-30 ENCOUNTER — Ambulatory Visit: Admitting: Neurology

## 2024-11-06 NOTE — ED Provider Notes (Signed)
 This patient is signed out to me by Dr.Yu.  Briefly, a 69 year old female with history of hypertension, diabetes and AFib on Eliquis  who presents to the emergency department chest pain started 2 days ago.  EKG nonischemic.  Sign out pending 2nd troponin which was also less than 2.7.  Patient with symptomatic pain improvement after 2 doses of IV fentanyl  50 mcg.  Patient does have a cardiologist appointment next week.  She understands all return to ER precautions and is comfortable with discharge home.    Maryla Maree Maryla Maree, MD  Physician

## 2024-11-06 NOTE — ED Provider Notes (Signed)
 [facility] EMERGENCY DEPARTMENT     Chief Complaint   Chest Pain and Shortness of Breath [phone] BIBEMS for shortness of breath and chest pain that started suddenly while driving. Patient called 911 after exiting [employer]. H/o TIA, DM, AFIB, taking eliquis )       History of Present Illness   [name] is a 69 y.o. female with a history of bipolar disorder, COPD, CHF, diabetes, HTN, HLD, afib who presents to the ED with a chief complaint of chest tightness. Pt states she was on her way to see her daughter, got off [employer], when she suddenly started to feel a tightness in the middle of her chest associated with shortness of breath. Denies fever/chills, cough, abdominal pain, N/V/D/C, leg swelling.       Medical History     Past Medical History[1]  Past Surgical History[2]  Family History   Problem Relation Age of Onset    Cancer Maternal Grandmother      Social History     Social History Narrative    Not on file      Social History     Tobacco Use    Smoking status: Never    Smokeless tobacco: Current   Substance Use Topics    Alcohol use: Never    Drug use: Never         Review of Systems   Review of Systems  See HPI      Physical Exam   Vitals:    11/06/24 1500 11/06/24 1518 11/06/24 1700 11/06/24 1715   BP: 126/66 130/60 109/67 122/63   Patient Position:       Pulse: 94 89 105 99   Resp: 13 16 17 12    Temp:       TempSrc:       SpO2: 100% 97% 98% 99%   Weight:       Height:         Physical Exam  Vitals and nursing note reviewed.   Constitutional:       General: She is not in acute distress.  HENT:      Head: Normocephalic and atraumatic.      Mouth/Throat:      Mouth: Mucous membranes are moist.   Eyes:      Extraocular Movements: Extraocular movements intact.      Pupils: Pupils are equal, round, and reactive to light.   Cardiovascular:      Rate and Rhythm: Normal rate and regular rhythm.   Pulmonary:      Effort: Pulmonary effort is normal.      Breath sounds: Wheezing (faint end  expiratory wheezes bilaterally) present. No rhonchi or rales.   Abdominal:      General: There is no distension.      Palpations: Abdomen is soft.      Tenderness: There is no abdominal tenderness. There is no guarding or rebound.   Musculoskeletal:      Cervical back: Neck supple.   Skin:     General: Skin is warm and dry.   Neurological:      Mental Status: She is alert and oriented to person, place, and time.          Procedures   Procedures  No notes on file       Medical Decision Making   Results for orders placed or performed during the hospital encounter of 11/06/24   CBC with Differential   Result Value Ref Range  WBC 7.0 3.7 - 11.0 thou/mcL    RBC 4.22 4.01 - 4.90 million/mcL    Hemoglobin 11.1 (L) 12.4 - 14.9 G/DL    Hematocrit 64.6 (L) 35.8 - 47.9 %    MCV 83.6 (L) 87.0 - 98.0 FL    MCH 26.3 (L) 27.0 - 33.0 PG    MCHC 31.4 31.0 - 37.0 gm/dL    RDW-SD 57.3 36 - 47 fL    Platelets 299 150 - 400 K/UL    MPV 9.9 8.9 - 11.0 FL    Neutrophils Percent 70.4 (H) 50.0 - 70.0 %    Absolute Neutrophil Count 4.92 1.50 - 7.50 thou/mcL    Nucleated RBC Percent 0 0 %    Nucleated RBC Abs 0.000 0 K/UL    Lymphocytes Percent 21.3 (L) 25.0 - 40.0 %    Lymphocytes Abs 1.5 thou/mcL    Monocytes Percent 6.4 4.0 - 12.0 %    Monos Abs 0.5 0.1 - 0.8 thou/mcL    Eosinophils Percent 1.0 1.0 - 6.0 %    Eosinophils Absolute 0.07 0.00 - 0.50 K/UL    Basophils Percent 0.6 0.0 - 2.0 %    Basophils Absolute 0.0 0.0 - 0.2 thou/mcL    IG, Percent 0.30 <=0.42 %    IG, Absolute 0.020 <=0.031 thou/mcL   Comprehensive metabolic panel   Result Value Ref Range    Sodium 144 136 - 145 mmol/L    Potassium 3.3 (L) 3.4 - 4.8 mmol/L    Chloride 109 (H) 98 - 107 MMOL/L    CO2 23 23 - 31 MMOL/L    Bun 11 10 - 20 mg/dL    Creatinine 0.9 0.6 - 1.1 mg/dL    Glucose 893 (H) 74 - 99 mg/dL    Calcium  9.4 8.5 - 10.5 mg/dL    Total Protein 7.6 5.8 - 8.1 g/dL    Albumin  4.2 3.2 - 4.6 g/dL    Total Bilirubin 0.2 (L) 0.3 - 1.2 mg/dL    Alk Phos 89 40 - 849 U/L     Ast 18 <35 U/L    Alt 9 <55 u/L    Anion Gap 12 5 - 15 mmol/L    est GFRcr 71 >=60 mL/min/1.51m2   Troponin I, High Sensitivity   Result Value Ref Range    High Sensitivity Troponin I <2.7 <5.0 ng/L   D-dimer for DIC evaluation   Result Value Ref Range    D-Dimer <0.27 <0.50 ug/mL(FEU)   Troponin I, High Sensitivity at 3 hours   Result Value Ref Range    High Sensitivity Troponin I <2.7 <5.0 ng/L   Rapid EKG 12-Lead   Result Value Ref Range    Heart Rate 97 BPM    Atrial Rate 97 BPM    P-R Interval 156 ms    QRSD Interval 84 ms    QT Interval 328 ms    QTC Interval 416 ms    P Axis 49 degrees    QRS Axis 77 degrees    T Wave Axis 268 degrees      XR CHEST SINGLE VW PORTABLE   Final Result   IMPRESSION:      Unremarkable frontal chest x-ray.          Thank you for this referral. For further consultation, please call   [phone] or [phone] (phones answered 24/7/365).       Electronically Signed by Rodgers Capri, MD   11/06/2024 2:45 PM EST   [facility]          MEDICATIONS RECEIVED IN ED:  Medications   fentaNYL  (PF) (SUBLIMAZE ) injection 50 mcg (50 mcg Intravenous Given 11/06/24 1505)   ipratropium (0.5mg )-albuterol  (2.5mg ) (DUONEB) inhalation solution 3 mL (3 mL Inhalation Given 11/06/24 1541)   fentaNYL  (PF) (SUBLIMAZE ) injection 50 mcg (50 mcg Intravenous Given 11/06/24 1715)       Medical Decision Making  Amount and/or Complexity of Data Reviewed  Labs: ordered. Decision-making details documented in ED Course.  Radiology: ordered.  ECG/medicine tests: ordered.    Risk  Prescription drug management.       69 y/o F presents with chest tightness and shortness of breath. Afebrile, hemodynamically stable, faint end expiratory wheezes present bilaterally, in no respiratory distress, breathing comfortably on room air. No LE edema. Will check labs, EKG, CXR, monitor and reassess.     EKG interpretation:   Normal sinus rhythm, rate 97. Normal conduction, normal intervals. QTc 416. Nonspecific  ST-T changes. No STEMI. Interpreted by me.      ED Course and Updates   ED Course as of 11/06/24 1851   Wed Nov 06, 2024   1512 D-Dimer for DIC Evaluation: <0.27 [MY]   1512 High Sensitivity Troponin I: <2.7 [MY]      ED Course User Index  [MY] Eleanor Cap, MD         Clinical Impressions as of 11/06/24 1851   Chest tightness   Shortness of breath          Disposition and Diagnosis   DISPOSITION:  Discharged [1]    DIAGNOSIS:  1. Chest tightness    2. Shortness of breath         MEDICATIONS:  New Prescriptions    No medications on file      Modified Medications    No medications on file      Discontinued Medications    No medications on file      Previous Medications    AMLODIPINE  (NORVASC ) 2.5 MG TABLET    TAKE ONE TABLET BY MOUTH DAILY.    ATORVASTATIN  (LIPITOR) 40 MG TABLET    TAKE 1 TABLET BY MOUTH EVERYDAY AT BEDTIME    DICLOFENAC (VOLTAREN) 1 % 1% GEL    USE 4 GRAM TO AFFECTED AREA 4 TIMES DAILY.    DICLOFENAC (VOLTAREN) 75 MG EC TABLET    TAKE 1 TABLET BY MOUTH TWICE A DAY    ELIQUIS  5 MG TABS    TAKE 1 TABLET BY MOUTH TWICE A DAY    FOLIC ACID (FOLVITE) 400 MCG TABLET    TAKE 1 TABLET BY MOUTH EVERY DAY    FUROSEMIDE  (LASIX ) 20 MG TABLET    Take 20 mg by mouth 2 times daily.    HYDROXYZINE  (VISTARIL ) 25 MG CAPSULE    1-2 CAPSULES EVERY 6 HOURS AS NEEDED FOR ANXIETY AND/OR INSOMNIA    LAMOTRIGINE  (LAMICTAL ) 100 MG TABLET    TAKE 1 TABLET BY MOUTH EVERY DAY    LATUDA  120 MG TABS    1 TAB WITH SUPPER    LORAZEPAM  (ATIVAN ) 0.5 MG TABLET    TAKE ONE TABLET BY MOUTH AT 12 PM AS NEEDED FOR FOR ANXIETY    MELOXICAM (MOBIC) 15 MG TABLET    TAKE 1 TABLET ORALLY DAILY TAKE AS NEEDED    METOPROLOL  SUCCINATE ER (TOPROL -XL) 25 MG 24 HR TABLET    TAKE 1 TABLET BY MOUTH EVERY DAY    OXYCODONE -ACETAMINOPHEN  (PERCOCET) 5-325 MG PER  TABLET    TAKE 1-2 TABS BY MOUTH 30 MINUTES BEFORE BONE MARROW BIOPSY    PANTOPRAZOLE  (PROTONIX ) 40 MG DELAYED RELEASE TABLET    TAKE 1 TABLET BY MOUTH TWICE A DAY    POTASSIUM 75 MG TABS    Take by  mouth.    PROMETHAZINE  (PHENERGAN ) 25 MG TABLET    1 TABLET AS NEEDED ORALLY TWICE A DAY AS NEEDED 30    QUETIAPINE  (SEROQUEL ) 100 MG TABLET    TAKE 1/2-1 TABLET BY MOUTH EVERY NIGHT AT BEDTIME    QUETIAPINE  FUMARATE (SEROQUEL  PO)    Take by mouth.    RANOLAZINE  (RANEXA ) 1000 MG SR TABLET    TAKE ONE TABLET (1,000 MG DOSE) BY MOUTH 2 (TWO) TIMES DAILY.    ROPINIROLE  (REQUIP ) 0.5 MG TABLET    TAKE 2 TABLETS BY MOUTH EVERY DAY AT BEDTIME    SUMATRIPTAN (IMITREX) 100 MG TABLET    PLEASE SEE ATTACHED FOR DETAILED DIRECTIONS    SYMBICORT  80-4.5 MCG/ACT INHALER    TAKE 2 PUFFS BY MOUTH TWICE A DAY    TERAZOSIN  (HYTRIN ) 2 MG CAPSULE    TAKE 1 CAPSULE BY MOUTH EVERYDAY AT BEDTIME    TRAMADOL  (ULTRAM ) 50 MG TABLET            FOLLOW-UP:  Raenell Trudy  [address]  [place] [address]  [phone]    Schedule an appointment as soon as possible for a visit in 3 days      Tandy Doctor, MD  [address]  [address]  [place] [address]  [phone]    Schedule an appointment as soon as possible for a visit             Attestation   Electronically signed by Eleanor Cap, MD.                  [1]  Past Medical History:   Anemia    Asthma    Atrial fibrillation    Bipolar disorder    Chest tightness    CHF (congestive heart failure)    COPD (chronic obstructive pulmonary disease)    COPD (chronic obstructive pulmonary disease)    CVA (cerebral vascular accident)    Diabetes mellitus    History of echocardiogram    Interpretation Summary The study was technically difficult with many images being suboptimal in quality. The poor quality of the study may affect the interpretation. The left ventricle is poorly visualized but it appears to be normal in size based on our echocardiographer's meausrements. There seems to be mild concentric left ventricular hypertrophy. Left ventricular systolic function is normal. E    HTN (hypertension)    Hyperlipidemia    Hypertension    Paroxysmal atrial fibrillation     TIA (transient ischemic attack)    Type 2 diabetes mellitus   [2]  Past Surgical History:  Procedure Laterality Date    CARDIAC CATHETERIZATION      CARDIAC CATHETERIZATION      CHOLECYSTECTOMY      HYSTERECTOMY      HYSTERECTOMY      Eleanor Cap, MD  Physician

## 2024-11-26 ENCOUNTER — Telehealth: Payer: Self-pay | Admitting: Neurology

## 2024-12-19 ENCOUNTER — Ambulatory Visit: Admitting: Neurology
